# Patient Record
Sex: Male | Born: 1940
Health system: Southern US, Community
[De-identification: ages and names within clinical notes are randomized; demographics above are authoritative.]

## PROBLEM LIST (undated history)

## (undated) DIAGNOSIS — I4891 Unspecified atrial fibrillation: Secondary | ICD-10-CM

## (undated) DIAGNOSIS — I219 Acute myocardial infarction, unspecified: Secondary | ICD-10-CM

## (undated) DIAGNOSIS — IMO0001 Reserved for inherently not codable concepts without codable children: Secondary | ICD-10-CM

## (undated) DIAGNOSIS — R3915 Urgency of urination: Secondary | ICD-10-CM

## (undated) DIAGNOSIS — M1990 Unspecified osteoarthritis, unspecified site: Secondary | ICD-10-CM

## (undated) DIAGNOSIS — I2699 Other pulmonary embolism without acute cor pulmonale: Secondary | ICD-10-CM

## (undated) DIAGNOSIS — K219 Gastro-esophageal reflux disease without esophagitis: Secondary | ICD-10-CM

## (undated) DIAGNOSIS — Z87442 Personal history of urinary calculi: Secondary | ICD-10-CM

## (undated) DIAGNOSIS — I2510 Atherosclerotic heart disease of native coronary artery without angina pectoris: Secondary | ICD-10-CM

## (undated) DIAGNOSIS — Z8739 Personal history of other diseases of the musculoskeletal system and connective tissue: Secondary | ICD-10-CM

## (undated) DIAGNOSIS — E039 Hypothyroidism, unspecified: Secondary | ICD-10-CM

## (undated) DIAGNOSIS — G8929 Other chronic pain: Secondary | ICD-10-CM

## (undated) DIAGNOSIS — I509 Heart failure, unspecified: Secondary | ICD-10-CM

## (undated) DIAGNOSIS — H269 Unspecified cataract: Secondary | ICD-10-CM

## (undated) DIAGNOSIS — I82409 Acute embolism and thrombosis of unspecified deep veins of unspecified lower extremity: Secondary | ICD-10-CM

## (undated) DIAGNOSIS — N400 Enlarged prostate without lower urinary tract symptoms: Secondary | ICD-10-CM

## (undated) DIAGNOSIS — Z860100 Personal history of colon polyps, unspecified: Secondary | ICD-10-CM

## (undated) DIAGNOSIS — N289 Disorder of kidney and ureter, unspecified: Secondary | ICD-10-CM

## (undated) DIAGNOSIS — I10 Essential (primary) hypertension: Secondary | ICD-10-CM

## (undated) DIAGNOSIS — I499 Cardiac arrhythmia, unspecified: Secondary | ICD-10-CM

## (undated) DIAGNOSIS — Z95810 Presence of automatic (implantable) cardiac defibrillator: Secondary | ICD-10-CM

## (undated) DIAGNOSIS — E785 Hyperlipidemia, unspecified: Secondary | ICD-10-CM

## (undated) DIAGNOSIS — I739 Peripheral vascular disease, unspecified: Secondary | ICD-10-CM

## (undated) DIAGNOSIS — I6529 Occlusion and stenosis of unspecified carotid artery: Secondary | ICD-10-CM

## (undated) DIAGNOSIS — M549 Dorsalgia, unspecified: Secondary | ICD-10-CM

## (undated) DIAGNOSIS — R350 Frequency of micturition: Secondary | ICD-10-CM

## (undated) HISTORY — DX: Occlusion and stenosis of unspecified carotid artery: I65.29

## (undated) HISTORY — PX: CYSTOSCOPY: SUR368

## (undated) HISTORY — DX: Acute embolism and thrombosis of unspecified deep veins of unspecified lower extremity: I82.409

## (undated) HISTORY — PX: EYE SURGERY: SHX253

## (undated) HISTORY — DX: Unspecified atrial fibrillation: I48.91

## (undated) HISTORY — PX: APPENDECTOMY: SHX54

## (undated) HISTORY — PX: COLONOSCOPY: SHX174

## (undated) HISTORY — PX: BACK SURGERY: SHX140

## (undated) HISTORY — DX: Heart failure, unspecified: I50.9

## (undated) HISTORY — PX: FEMORAL ARTERY - POPLITEAL ARTERY BYPASS GRAFT: SUR180

## (undated) HISTORY — PX: JOINT REPLACEMENT: SHX530

## (undated) HISTORY — PX: OTHER SURGICAL HISTORY: SHX169

## (undated) HISTORY — DX: Atherosclerotic heart disease of native coronary artery without angina pectoris: I25.10

## (undated) HISTORY — DX: Unspecified cataract: H26.9

## (undated) HISTORY — PX: CARDIAC CATHETERIZATION: SHX172

---

## 1998-06-17 ENCOUNTER — Ambulatory Visit (HOSPITAL_COMMUNITY): Admission: RE | Admit: 1998-06-17 | Discharge: 1998-06-17 | Payer: Self-pay | Admitting: *Deleted

## 1998-08-08 ENCOUNTER — Emergency Department (HOSPITAL_COMMUNITY): Admission: EM | Admit: 1998-08-08 | Discharge: 1998-08-08 | Payer: Self-pay | Admitting: Emergency Medicine

## 1998-08-10 ENCOUNTER — Encounter: Payer: Self-pay | Admitting: Urology

## 1998-08-10 ENCOUNTER — Inpatient Hospital Stay (HOSPITAL_COMMUNITY): Admission: EM | Admit: 1998-08-10 | Discharge: 1998-08-11 | Payer: Self-pay | Admitting: Emergency Medicine

## 1998-10-31 ENCOUNTER — Inpatient Hospital Stay (HOSPITAL_COMMUNITY): Admission: RE | Admit: 1998-10-31 | Discharge: 1998-11-02 | Payer: Self-pay | Admitting: Orthopaedic Surgery

## 1998-10-31 ENCOUNTER — Encounter: Payer: Self-pay | Admitting: Orthopaedic Surgery

## 1998-11-11 ENCOUNTER — Encounter: Admission: RE | Admit: 1998-11-11 | Discharge: 1999-02-09 | Payer: Self-pay | Admitting: Orthopaedic Surgery

## 1999-02-11 ENCOUNTER — Encounter: Admission: RE | Admit: 1999-02-11 | Discharge: 1999-02-27 | Payer: Self-pay | Admitting: Orthopaedic Surgery

## 1999-09-18 ENCOUNTER — Other Ambulatory Visit: Admission: RE | Admit: 1999-09-18 | Discharge: 1999-09-18 | Payer: Self-pay | Admitting: Urology

## 2002-05-22 ENCOUNTER — Ambulatory Visit (HOSPITAL_COMMUNITY): Admission: RE | Admit: 2002-05-22 | Discharge: 2002-05-22 | Payer: Self-pay | Admitting: Gastroenterology

## 2002-06-05 ENCOUNTER — Encounter (INDEPENDENT_AMBULATORY_CARE_PROVIDER_SITE_OTHER): Payer: Self-pay | Admitting: Specialist

## 2002-06-05 ENCOUNTER — Ambulatory Visit (HOSPITAL_COMMUNITY): Admission: RE | Admit: 2002-06-05 | Discharge: 2002-06-05 | Payer: Self-pay | Admitting: Gastroenterology

## 2006-04-21 ENCOUNTER — Encounter: Payer: Self-pay | Admitting: Urology

## 2006-04-21 ENCOUNTER — Encounter: Payer: Self-pay | Admitting: Emergency Medicine

## 2006-07-28 ENCOUNTER — Ambulatory Visit (HOSPITAL_COMMUNITY): Admission: RE | Admit: 2006-07-28 | Discharge: 2006-07-28 | Payer: Self-pay | Admitting: Internal Medicine

## 2007-08-30 ENCOUNTER — Ambulatory Visit: Payer: Self-pay | Admitting: Vascular Surgery

## 2007-09-13 ENCOUNTER — Ambulatory Visit: Payer: Self-pay | Admitting: Vascular Surgery

## 2008-07-23 ENCOUNTER — Encounter: Admission: RE | Admit: 2008-07-23 | Discharge: 2008-07-23 | Payer: Self-pay | Admitting: Orthopaedic Surgery

## 2008-07-24 ENCOUNTER — Ambulatory Visit: Payer: Self-pay | Admitting: Gastroenterology

## 2008-08-08 ENCOUNTER — Encounter: Payer: Self-pay | Admitting: Gastroenterology

## 2008-08-08 ENCOUNTER — Ambulatory Visit: Payer: Self-pay | Admitting: Gastroenterology

## 2008-08-13 ENCOUNTER — Encounter: Payer: Self-pay | Admitting: Gastroenterology

## 2009-03-06 ENCOUNTER — Inpatient Hospital Stay (HOSPITAL_COMMUNITY): Admission: AD | Admit: 2009-03-06 | Discharge: 2009-03-07 | Payer: Self-pay | Admitting: Cardiovascular Disease

## 2010-04-01 ENCOUNTER — Encounter (INDEPENDENT_AMBULATORY_CARE_PROVIDER_SITE_OTHER): Payer: Self-pay | Admitting: *Deleted

## 2011-01-20 NOTE — Letter (Signed)
Summary: New Patient letter  [name]  [address] [place], [place] [address]   Phone: [phone]  Fax: [phone]       04/01/2010 MRN: [id number]  [name] [address] [place], [place]  [address]  Dear Ryan [name],  Welcome to the Gastroenterology Division at [name].    You are scheduled to see Dr. Arlyce Dice on 04-30-10 at 10:00a.m.  on the 3rd floor at [facility], [address].  We ask that you try to arrive at our office 15 minutes prior to your appointment time to allow for check-in.  We would like you to complete the enclosed self-administered evaluation form prior to your visit and bring it with you on the day of your appointment.  We will review it with you.  Also, please bring a complete list of all your medications or, if you prefer, bring the medication bottles and we will list them.  Please bring your insurance card so that we may make a copy of it.  If your insurance requires a referral to see a specialist, please bring your referral form from your primary care physician.  Co-payments are due at the time of your visit and may be paid by cash, check or credit card.     Your office visit will consist of a consult with your physician (includes a physical exam), any laboratory testing he/she may order, scheduling of any necessary diagnostic testing (e.g. x-ray, ultrasound, CT-scan), and scheduling of a procedure (e.g. Endoscopy, Colonoscopy) if required.  Please allow enough time on your schedule to allow for any/all of these possibilities.    If you cannot keep your appointment, please call [phone] to cancel or reschedule prior to your appointment date.  This allows [place] the opportunity to schedule an appointment for another patient in need of care.  If you do not cancel or reschedule by 5 p.m. the business day prior to your appointment date, you will be charged a $50.00 late cancellation/no-show fee.    Thank you for  choosing [facility] for your medical needs.  We appreciate the opportunity to care for you.  Please visit [place] at our website  to learn more about our practice.                     Sincerely,                                                             The Gastroenterology Division

## 2011-04-02 LAB — COMPREHENSIVE METABOLIC PANEL
ALT: 17 U/L (ref 0–53)
AST: 25 U/L (ref 0–37)
Alkaline Phosphatase: 45 U/L (ref 39–117)
CO2: 25 mEq/L (ref 19–32)
Chloride: 105 mEq/L (ref 96–112)
Creatinine, Ser: 1.02 mg/dL (ref 0.4–1.5)
GFR calc Af Amer: >60 mL/min (ref >60)
GFR calc non Af Amer: >60 mL/min (ref >60)
Sodium: 139 mEq/L (ref 135–145)
Total Bilirubin: 0.8 mg/dL (ref 0.3–1.2)

## 2011-04-02 LAB — CBC
MCHC: 34.5 g/dL (ref 30.0–36.0)
MCV: 94.5 fL (ref 78.0–100.0)
MCV: 94.5 fL (ref 78.0–100.0)
Platelets: 231 10*3/uL (ref 150–400)
RBC: 4.22 MIL/uL (ref 4.22–5.81)
RBC: 4.47 MIL/uL (ref 4.22–5.81)
RDW: 12.8 % (ref 11.5–15.5)
WBC: 9.1 10*3/uL (ref 4.0–10.5)

## 2011-04-02 LAB — CARDIAC PANEL(CRET KIN+CKTOT+MB+TROPI): Total CK: 301 U/L — ABNORMAL HIGH (ref 7–232)

## 2011-04-02 LAB — HEPARIN LEVEL (UNFRACTIONATED): Heparin Unfractionated: 0.29 IU/mL — ABNORMAL LOW (ref 0.30–0.70)

## 2011-05-05 NOTE — H&P (Signed)
NAMECLARKSON, ROSSELLI NO.:  [id number]   MEDICAL RECORD NO.:  [id number]          PATIENT TYPE:  INP   LOCATION:  2003                         FACILITY:  [facility]   PHYSICIAN:  Vesta Mixer, M.D. DATE OF BIRTH:  March 06, 1941   DATE OF ADMISSION:  03/06/2009  DATE OF DISCHARGE:                              HISTORY & PHYSICAL   [name] is a 70 year old gentleman with a history of peripheral  vascular disease and a remote history of myocardial infarction.  He is  admitted to the hospital with symptoms consistent with unstable angina.   [name] has remote history of an inferior wall myocardial  infarction.  He was previously seen by Dr. Othelia Pulling.  He has a  history of peripheral vascular disease and is status post aortobifemoral  bypass by Dr. Jerilee Field.   He presents today with several days of intermittent episodes of chest  pain.  It is left sided.  There is no specific radiation.  It can last  for several hours and is associated with nausea and some presyncope.  He  also describes some shortness of breath for the past several months.  There is no diaphoresis.  He has been under lots of mental stress  recently.  There are no precipitating factors.  These episodes can occur  various times and occasionally have been associated with exertion.  These chest pains have been more frequent over the past several days.   He denies any association with eating, drinking, or change of position.  He denies any PND or orthopnea.  He denies any syncope or presyncope.   CURRENT MEDICATIONS:  1. Benicar 40 mg a day.  2. Lipitor 20 mg a day.   He is allergic to CODEINE.   PAST MEDICAL HISTORY:  1. History of remote inferior wall myocardial infarction.  2. History of peripheral vascular disease - status post aortobifemoral      bypass by Dr. Jerilee Field.   SOCIAL HISTORY:  The patient used to smoke, but quit 30+ years ago.  He  does not drink alcohol.   FAMILY HISTORY:  Significant for cardiac disease.   REVIEW OF SYSTEMS:  Reviewed.  All other systems were negative.   PHYSICAL EXAMINATION:  GENERAL:  He is a middle-aged gentleman in no  acute distress.  He is alert and oriented x3 and his mood and affect are  normal.  VITAL SIGNS:  His weight is 188.  Blood pressure is 130/70 with heart  rate of 84.  HEENT:  2+ carotids.  He has no bruits.  No JVD.  NECK:  Supple.  LUNGS:  Clear.  BACK:  Nontender.  HEART:  Regular rate.  S1 and S2.  There is no murmurs, gallops, or  rubs.  His PMI is nondisplaced.  ABDOMINAL:  Good bowel sounds and is nontender.  EXTREMITIES:  He has no clubbing, cyanosis, or edema.  NEUROLOGIC:  Nonfocal.   His EKG reveals normal sinus rhythm.  He has nonspecific ST and T wave  changes.   [name] presents with some symptoms consistent with unstable  angina.  He has a history of previous inferior wall myocardial  infarction and states that these pains are very similar to his previous  MI pains.  We gave him some nitroglycerin here in the office without  significant change.  We will add aspirin to his medical regimen.  We  will admit him to the hospital and start him on heparin.  We will  anticipate doing a heart catheterization tomorrow.  We have discussed  the risks, benefits, and options of heart catheterization.  He  understands and agrees to proceed.       Vesta Mixer, M.D.  Electronically Signed     PJN/MEDQ  D:  03/06/2009  T:  03/07/2009  Job:  [id number]   cc:   Quita Skye. Hart [place], M.D.

## 2011-05-05 NOTE — Consult Note (Signed)
VASCULAR SURGERY CONSULTATION   Ryan [name], Ryan [name]  DOB:  05-11-1941                                       09/13/2007  CHART#:[id number]   This is a new vascular surgery consultation.   Patient is a 70 year old male patient known to me from 20 years ago,  having undergone aortobifemoral bypass grafting for severe claudication  symptoms in 1987.  He quit smoking at that time and has done well from  his claudication standpoint until the past year when he developed hip  discomfort with ambulation, which is usually symmetrical and requires  him to stop talking after veritable distances.  Sometimes this is as  small as one block and sometimes it is after a half mile, but it is  always relieved by rest.  He has also noticed some numbness and tingling  on the anterior aspect of his right thigh, which is a new finding.  He  has gained some weight in the last few years.  He has had no rest pain  or history of nonhealing ulcers.   Past medical history is negative for diabetes, hypertension, CVA,  stroke, or COPD.  He does have a history of coronary artery disease with  at least two previous myocardial infarctions but is currently  asymptomatic.   PREVIOUS SURGERY:  1. Aortobifemoral bypass grafting.  2. Arthroscopy, left knee.  3. Left shoulder replacement by Dr. Cleophas Dunker.   FAMILY HISTORY:  Positive for coronary artery disease in his father and  sister, who died of a myocardial infarction.  Positive for diabetes in a  son.  Negative for stroke.   SOCIAL HISTORY:  He is married.  Has two children.  Is retired.  He has  not smoked since 1988 and has not used alcohol.   REVIEW OF SYSTEMS:  Please see health history form.   MEDICATIONS:  Please see health history form.   PHYSICAL EXAMINATION:  Blood pressure 140/60, heart rate 71,  respirations 16.  Generally, he is a healthy-appearing male in no  apparent distress.  He is alert and oriented x3.  His neck is  supple  with 3+ carotid pulses palpable.  No bruits are audible.  Neurologic  exam is normal.  No palpable adenopathy in the neck.  Chest:  Clear to  auscultation.  Cardiovascular:  Regular rhythm with no murmurs.  Upper  extremity pulses are 3+ bilaterally.  His abdomen is soft and nontender  with no palpable masses.  He has 3+ femoral, popliteal, and dorsalis  pedis pulses bilaterally with well perfused lower extremities.  No  bruits are audible.   Lower extremity arterial Dopplers were performed in our office a week  ago and reveal normal ABIs and normal waveforms bilaterally.   I do not think his symptoms are due to any major arterial insufficiency.  He could have some partial blockages in his internal iliac arteries,  which would not be something we would treat.  It also could be that he  has some arthritis in his hip joints, since he has had arthritis in  other areas, and I would recommend an orthopedic evaluation if these  symptoms are limiting enough.  I will be happy to see him again in the  future on a p.r.n. basis.   Quita Skye Hart [place], M.D.  Electronically Signed  JDL/MEDQ  D:  09/13/2007  T:  09/14/2007  Job:  [id number]   cc:   Kari Baars, M.D.

## 2011-05-05 NOTE — Cardiovascular Report (Signed)
NAMEADEN, SEK NO.:  [id number]   MEDICAL RECORD NO.:  [id number]          PATIENT TYPE:  INP   LOCATION:  2003                         FACILITY:  [facility]   PHYSICIAN:  Vesta Mixer, M.D. DATE OF BIRTH:  01-02-1941   DATE OF PROCEDURE:  03/07/2009  DATE OF DISCHARGE:  03/07/2009                            CARDIAC CATHETERIZATION   [name] is a 70 year old gentleman with a history of peripheral  vascular disease.  He is status post aortobifemoral approximately 22  years ago.  He presented yesterday.  He also has a history of 2  myocardial infarctions in the past.  He has never had a heart  catheterization.  He presented to the office yesterday with episodes of  chest pain, nausea, and symptoms consistent with unstable angina.  He  was admitted and referred for heart catheterization for further  evaluation.   The procedure was left heart catheterization with coronary angiography.   The right femoral artery was easily cannulated using modified Seldinger  technique.   HEMODYNAMICS:  LV pressure is 115/16 with an aortic pressure of 112/55.   ANGIOGRAPHY:  Left main.  The left main has minor irregularities.  There  is moderate degree of calcification extending from the left main down to  the LAD.   The left anterior descending artery has a proximal 30% stenosis.  There  is diffuse irregularities between 30-40% along the entire LAD.  The mid  LAD has a 40-50% stenosis and the distal LAD has a 50-60% stenosis.  The  first diagonal artery has mild-to-moderate diffuse disease.   The ramus intermediate vessel has mild diffuse disease.  There is 30-40%  stenosis throughout.   The left circumflex artery has a proximal 30-40% stenosis.  It gives off  a small-to-medium size first obtuse marginal branch and is then  occluded.  The terminal circumflex artery fills very slowly and has  probably TIMI grade 1 flow.  It fills via left-to-left collaterals.   The right coronary artery is dominant.  It is occluded very proximally.  The distal right coronary artery fills primarily via left-to-right  collaterals from the LAD.  There is some right-to-right filling via  collaterals and the right coronary artery seemed to be severely and  diffusely diseased throughout its course.   The left ventriculogram was performed in the 30 RAO position.  Reveals,  overall well-preserved left ventricular systolic function.  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  The overall ejection fraction is probably  50%.  Excluding the aneurysm, the left ventricular systolic function is  quite normal.  There is 1-2 plus mitral regurgitation.   COMPLICATIONS:  None.   CONCLUSION:  Moderate diffuse coronary artery disease.  He has 2 known  myocardial infarctions, and now we have been able to identify that these  were due to an occlusion of the right coronary artery and the distal  left  circumflex artery.  He has moderate lesions elsewhere and none of these  are severe enough to warrant revascularization.  He has well-preserved  left ventricular systolic function.  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  There is no evidence of thrombus.  We will anticipate him  going home today.      Vesta Mixer, M.D.  Electronically Signed     PJN/MEDQ  D:  03/07/2009  T:  03/07/2009  Job:  [id number]

## 2011-05-05 NOTE — Discharge Summary (Signed)
[name], Ryan [name] NO.:  [id number]   MEDICAL RECORD NO.:  [id number]          PATIENT TYPE:  INP   LOCATION:  2003                         FACILITY:  [facility]   PHYSICIAN:  Vesta Mixer, M.D. DATE OF BIRTH:  December 28, 1940   DATE OF ADMISSION:  03/06/2009  DATE OF DISCHARGE:  03/07/2009                               DISCHARGE SUMMARY   DISCHARGE DIAGNOSES:  1. Chest pain - most likely noncardiac.  2. History of coronary artery disease with 2 previous myocardial      infarctions.  3. Dyslipidemia.  4. History of peripheral vascular disease.   DISCHARGE MEDICATIONS:  1. Aspirin 81 mg a day.  2. Lipitor 20 mg a day.  3. Benicar 40 mg a day.  4. Nitroglycerin 0.4 mg sublingually as needed.   DISPOSITION:  The patient will see Dr. Elease Hashimoto in 1-2 weeks.  He is to  see Dr. Clelia Croft if needed for medical problems.   HISTORY:  Mr. Potvin is a 70 year old gentleman who was admitted with  some episodes of chest pain.  He was admitted yesterday.  Please see  dictated H and P for further details.   HOSPITAL COURSE:  Chest pain.  The patient had negative cardiac enzymes.  He had a heart catheterization this morning which revealed an occluded  right coronary artery as well as an occluded distal circumflex artery.  These are certainly the site of his 2 previous and known myocardial  infarctions.  He had moderate disease elsewhere.  He did not have any  critical lesions that would be the cause of unstable angina.  He  tolerated the cath fairly well.  We will send him home on the above-  noted medications as well as aspirin plus nitroglycerin.  He has tried  Crestor in the past, but did not tolerate because of some GI bleeding.  We may want to retry that again.  I have never heard of Crestor causing  GI bleeds.  He will need aggressive medical therapy for moderate diffuse  coronary artery disease.  We will see him back in the office in a week  or two.      Vesta Mixer, M.D.  Electronically Signed     PJN/MEDQ  D:  03/07/2009  T:  03/08/2009  Job:  [id number]   cc:   Kari Baars, M.D.  Quita Skye Hart [place], M.D.

## 2011-05-11 ENCOUNTER — Other Ambulatory Visit (INDEPENDENT_AMBULATORY_CARE_PROVIDER_SITE_OTHER): Payer: Medicare Other

## 2011-05-11 DIAGNOSIS — I6529 Occlusion and stenosis of unspecified carotid artery: Secondary | ICD-10-CM

## 2011-05-20 NOTE — Procedures (Unsigned)
CAROTID DUPLEX EXAM  INDICATION:  Followup carotid disease.  HISTORY: Diabetes:  No. Cardiac:  Yes. Hypertension:  No. Smoking:  Previous. Previous Surgery:  Peripheral vascular disease - aortobifem graft. CV History: Amaurosis Fugax No, Paresthesias No, Hemiparesis No                                      RIGHT             LEFT Brachial systolic pressure:         146               138 Brachial Doppler waveforms:         WNL               WNL Vertebral direction of flow:        Antegrade         Antegrade DUPLEX VELOCITIES (cm/sec) CCA peak systolic                   97                87 ECA peak systolic                   297               206 ICA peak systolic                   66                221 ICA end diastolic                   20                63 PLAQUE MORPHOLOGY:                  Calcific          Calcific PLAQUE AMOUNT:                      Mild              Moderate to severe PLAQUE LOCATION:                    CCA, ECA, ICA     ECA, ICA  IMPRESSION: 1. 1%-39% stenosis of the right internal carotid artery. 2. Borderline 60%-79% left internal carotid artery stenosis. 3. Antegrade vertebral arteries bilaterally. 4. Bilateral external carotid artery stenosis.  ___________________________________________ [name], M.D.  LT/MEDQ  D:  05/11/2011  T:  05/11/2011  Job:  [id number]

## 2011-06-30 ENCOUNTER — Encounter (INDEPENDENT_AMBULATORY_CARE_PROVIDER_SITE_OTHER): Payer: Medicare Other | Admitting: Vascular Surgery

## 2011-06-30 DIAGNOSIS — I6529 Occlusion and stenosis of unspecified carotid artery: Secondary | ICD-10-CM

## 2011-06-30 NOTE — Consult Note (Signed)
NEW PATIENT CONSULTATION  Ryan [name], Ryan [name] DOB:  Jul 31, 1941                                       06/30/2011 CHART#:[id number]  The patient is a 70 year old male patient known to me from previous aortobifemoral bypass grafting in 1987 for severe claudication.  He has done well from that standpoint and I also saw him in 2008 with some leg symptoms which were not due to vascular insufficiency.  He is now able to walk about 2 miles without stopping.  He was found to have some carotid occlusive disease, has no history of stroke, hemiparesis, aphasia, amaurosis fugax, diplopia, blurred vision or syncope.  STABLE MEDICAL PROBLEMS: 1. Hypertension. 2. Hyperlipidemia. 3. Hypothyroidism. 4. Gout. 5. Elevated PSA. 6. Negative for coronary artery disease, COPD or stroke.  SOCIAL HISTORY:  He is married, does not use tobacco or alcohol.  FAMILY HISTORY:  Positive for coronary artery disease in his father and sister.  Negative for stroke.  Positive for diabetes in his son.  REVIEW OF SYSTEMS:  Denies any chest pain, dyspnea on exertion, PND, orthopnea, has occasional hip discomfort from arthritis.  All other systems are negative in complete review of systems.  PHYSICAL EXAM:  Vital signs:  Blood pressure 147/82, heart rate 81, respirations 18.  General:  A well-developed, well-nourished male in no apparent distress, alert and oriented x3.  HEENT:  Exam normal for age. EOMs intact.  Lungs:  Clear to auscultation.  No rhonchi or wheezing. Cardiovascular:  Regular rhythm.  No murmurs.  Carotid pulses are 3+. Soft bruit on the left.  Neurologic:  Normal.  Abdomen:  Soft, nontender with no masses or ventral hernia.  Musculoskeletal:  Exam is free of major deformities.  Lower extremities:  Exam reveals 2+ femoral, 2+ posterior tibial pulses palpable bilaterally.  I reviewed and interpreted his carotid duplex exam which revealed a moderate left internal carotid stenosis  in the 60%-70% range with no flow reduction in his right internal carotid.  The patient is asymptomatic with a moderate left internal carotid stenosis which we will need to follow on an annual basis.  If he develops any symptoms he will be in touch with me, otherwise we will check his carotid study again in 1 year.  He will continue on daily aspirin.    Quita Skye Hart [place], M.D. Electronically Signed  JDL/MEDQ  D:  06/30/2011  T:  06/30/2011  Job:  [id number]  cc:   Kari Baars, M.D.

## 2012-01-21 DIAGNOSIS — M5137 Other intervertebral disc degeneration, lumbosacral region: Secondary | ICD-10-CM | POA: Diagnosis not present

## 2012-01-21 DIAGNOSIS — M48061 Spinal stenosis, lumbar region without neurogenic claudication: Secondary | ICD-10-CM | POA: Diagnosis not present

## 2012-01-21 DIAGNOSIS — M545 Low back pain, unspecified: Secondary | ICD-10-CM | POA: Diagnosis not present

## 2012-01-21 DIAGNOSIS — IMO0002 Reserved for concepts with insufficient information to code with codable children: Secondary | ICD-10-CM | POA: Diagnosis not present

## 2012-01-22 ENCOUNTER — Other Ambulatory Visit: Payer: Self-pay | Admitting: Neurosurgery

## 2012-01-25 DIAGNOSIS — I10 Essential (primary) hypertension: Secondary | ICD-10-CM | POA: Diagnosis not present

## 2012-01-25 DIAGNOSIS — E785 Hyperlipidemia, unspecified: Secondary | ICD-10-CM | POA: Diagnosis not present

## 2012-01-25 DIAGNOSIS — E039 Hypothyroidism, unspecified: Secondary | ICD-10-CM | POA: Diagnosis not present

## 2012-01-25 DIAGNOSIS — R7301 Impaired fasting glucose: Secondary | ICD-10-CM | POA: Diagnosis not present

## 2012-02-01 ENCOUNTER — Encounter (HOSPITAL_COMMUNITY): Payer: Self-pay | Admitting: Pharmacy Technician

## 2012-02-04 NOTE — Pre-Procedure Instructions (Signed)
20 [name]  02/04/2012   Your procedure is scheduled on:  02/12/12  Report to [facility] at 1230 pm  Call this number if you have problems the morning of surgery: [phone]   Remember:   Do not eat food:After Midnight.  May have clear liquids: up to 4 Hours before arrival.  Clear liquids include soda, tea, black coffee, apple or grape juice, broth.  Take these medicines the morning of surgery with A SIP OF WATER: synthroid,bystolic,flomax   Do not wear jewelry, make-up or nail polish.  Do not wear lotions, powders, or perfumes. You may wear deodorant.  Do not shave 48 hours prior to surgery.  Do not bring valuables to the hospital.  Contacts, dentures or bridgework may not be worn into surgery.  Leave suitcase in the car. After surgery it may be brought to your room.  For patients admitted to the hospital, checkout time is 11:00 AM the day of discharge.   Patients discharged the day of surgery will not be allowed to drive home.  Name and phone number of your driver: family  Special Instructions: CHG Shower Use Special Wash: 1/2 bottle night before surgery and 1/2 bottle morning of surgery.   Please read over the following fact sheets that you were given: Pain Booklet, Coughing and Deep Breathing, MRSA Information and Surgical Site Infection Prevention

## 2012-02-05 ENCOUNTER — Encounter (HOSPITAL_COMMUNITY)
Admission: RE | Admit: 2012-02-05 | Discharge: 2012-02-05 | Disposition: A | Discharge status: Home / Self Care | Payer: Medicare Other | Source: Ambulatory Visit | Attending: Neurosurgery | Admitting: Neurosurgery

## 2012-02-05 ENCOUNTER — Encounter (HOSPITAL_COMMUNITY): Payer: Self-pay

## 2012-02-05 ENCOUNTER — Other Ambulatory Visit: Payer: Self-pay

## 2012-02-05 DIAGNOSIS — I517 Cardiomegaly: Secondary | ICD-10-CM | POA: Diagnosis not present

## 2012-02-05 DIAGNOSIS — M48061 Spinal stenosis, lumbar region without neurogenic claudication: Secondary | ICD-10-CM | POA: Diagnosis not present

## 2012-02-05 DIAGNOSIS — I739 Peripheral vascular disease, unspecified: Secondary | ICD-10-CM | POA: Diagnosis not present

## 2012-02-05 DIAGNOSIS — IMO0002 Reserved for concepts with insufficient information to code with codable children: Secondary | ICD-10-CM | POA: Diagnosis not present

## 2012-02-05 DIAGNOSIS — I10 Essential (primary) hypertension: Secondary | ICD-10-CM | POA: Diagnosis not present

## 2012-02-05 DIAGNOSIS — M4800 Spinal stenosis, site unspecified: Secondary | ICD-10-CM | POA: Diagnosis not present

## 2012-02-05 HISTORY — DX: Essential (primary) hypertension: I10

## 2012-02-05 HISTORY — DX: Peripheral vascular disease, unspecified: I73.9

## 2012-02-05 HISTORY — DX: Acute myocardial infarction, unspecified: I21.9

## 2012-02-05 HISTORY — DX: Unspecified osteoarthritis, unspecified site: M19.90

## 2012-02-05 HISTORY — DX: Hyperlipidemia, unspecified: E78.5

## 2012-02-05 LAB — CBC
HCT: 43.4 % (ref 39.0–52.0)
MCHC: 34.6 g/dL (ref 30.0–36.0)
Platelets: 293 10*3/uL (ref 150–400)
RDW: 12.1 % (ref 11.5–15.5)

## 2012-02-05 LAB — BASIC METABOLIC PANEL
BUN: 14 mg/dL (ref 6–23)
GFR calc Af Amer: >90 mL/min (ref >90)
GFR calc non Af Amer: 82 mL/min — ABNORMAL LOW (ref >90)
Potassium: 4.7 mEq/L (ref 3.5–5.1)

## 2012-02-05 LAB — SURGICAL PCR SCREEN: MRSA, PCR: NEGATIVE

## 2012-02-05 NOTE — Progress Notes (Signed)
Last cardiac info in chart and epic 2010.has not seen cardiac md since.

## 2012-02-11 MED ORDER — CEFAZOLIN SODIUM-DEXTROSE 2-3 GM-% IV SOLR
2.0000 g | INTRAVENOUS | Status: DC
  Filled 2012-02-11: qty 50

## 2012-02-12 ENCOUNTER — Inpatient Hospital Stay (HOSPITAL_COMMUNITY)
Admission: RE | Admit: 2012-02-12 | Discharge: 2012-02-13 | DRG: 491 | Disposition: A | Discharge status: Home / Self Care | Payer: Medicare Other | Source: Ambulatory Visit | Attending: Neurosurgery | Admitting: Neurosurgery

## 2012-02-12 ENCOUNTER — Inpatient Hospital Stay (HOSPITAL_COMMUNITY): Payer: Medicare Other

## 2012-02-12 ENCOUNTER — Encounter (HOSPITAL_COMMUNITY): Payer: Self-pay | Admitting: Vascular Surgery

## 2012-02-12 ENCOUNTER — Encounter (HOSPITAL_COMMUNITY)
Admission: RE | Disposition: A | Discharge status: Home / Self Care | Payer: Self-pay | Source: Ambulatory Visit | Attending: Neurosurgery

## 2012-02-12 ENCOUNTER — Inpatient Hospital Stay (HOSPITAL_COMMUNITY): Payer: Medicare Other | Admitting: Vascular Surgery

## 2012-02-12 ENCOUNTER — Encounter (HOSPITAL_COMMUNITY): Payer: Self-pay | Admitting: *Deleted

## 2012-02-12 DIAGNOSIS — IMO0002 Reserved for concepts with insufficient information to code with codable children: Secondary | ICD-10-CM | POA: Diagnosis present

## 2012-02-12 DIAGNOSIS — I252 Old myocardial infarction: Secondary | ICD-10-CM | POA: Diagnosis not present

## 2012-02-12 DIAGNOSIS — M5137 Other intervertebral disc degeneration, lumbosacral region: Secondary | ICD-10-CM | POA: Diagnosis not present

## 2012-02-12 DIAGNOSIS — Z886 Allergy status to analgesic agent status: Secondary | ICD-10-CM | POA: Diagnosis not present

## 2012-02-12 DIAGNOSIS — I10 Essential (primary) hypertension: Secondary | ICD-10-CM | POA: Diagnosis present

## 2012-02-12 DIAGNOSIS — Z9889 Other specified postprocedural states: Secondary | ICD-10-CM

## 2012-02-12 DIAGNOSIS — Z79899 Other long term (current) drug therapy: Secondary | ICD-10-CM

## 2012-02-12 DIAGNOSIS — Z01812 Encounter for preprocedural laboratory examination: Secondary | ICD-10-CM | POA: Diagnosis not present

## 2012-02-12 DIAGNOSIS — Z981 Arthrodesis status: Secondary | ICD-10-CM | POA: Diagnosis not present

## 2012-02-12 DIAGNOSIS — M48061 Spinal stenosis, lumbar region without neurogenic claudication: Secondary | ICD-10-CM | POA: Diagnosis not present

## 2012-02-12 DIAGNOSIS — I739 Peripheral vascular disease, unspecified: Secondary | ICD-10-CM | POA: Diagnosis not present

## 2012-02-12 DIAGNOSIS — M545 Low back pain, unspecified: Secondary | ICD-10-CM | POA: Diagnosis not present

## 2012-02-12 DIAGNOSIS — E039 Hypothyroidism, unspecified: Secondary | ICD-10-CM | POA: Diagnosis present

## 2012-02-12 DIAGNOSIS — Z7982 Long term (current) use of aspirin: Secondary | ICD-10-CM

## 2012-02-12 DIAGNOSIS — Z96619 Presence of unspecified artificial shoulder joint: Secondary | ICD-10-CM

## 2012-02-12 HISTORY — PX: LUMBAR LAMINECTOMY/DECOMPRESSION MICRODISCECTOMY: SHX5026

## 2012-02-12 SURGERY — LUMBAR LAMINECTOMY/DECOMPRESSION MICRODISCECTOMY
Anesthesia: General | Site: Back | Wound class: Clean

## 2012-02-12 MED ORDER — SODIUM CHLORIDE 0.9 % IJ SOLN
3.0000 mL | Freq: Two times a day (BID) | INTRAMUSCULAR | Status: DC
  Administered 2012-02-12 – 2012-02-13 (×2): 3 mL via INTRAVENOUS

## 2012-02-12 MED ORDER — EPHEDRINE SULFATE 50 MG/ML IJ SOLN
INTRAMUSCULAR | Status: DC | PRN
  Administered 2012-02-12 (×3): 10 mg via INTRAVENOUS

## 2012-02-12 MED ORDER — SODIUM CHLORIDE 0.9 % IJ SOLN: 3.0000 mL | INTRAMUSCULAR | Status: DC | PRN

## 2012-02-12 MED ORDER — MORPHINE SULFATE 4 MG/ML IJ SOLN
2.0000 mg | INTRAMUSCULAR | Status: DC | PRN
  Administered 2012-02-13: 2 mg via INTRAVENOUS
  Filled 2012-02-12: qty 1

## 2012-02-12 MED ORDER — HEMOSTATIC AGENTS (NO CHARGE) OPTIME
TOPICAL | Status: DC | PRN
  Administered 2012-02-12: 1 via TOPICAL

## 2012-02-12 MED ORDER — LEVOTHYROXINE SODIUM 75 MCG PO TABS
75.0000 ug | ORAL_TABLET | Freq: Every day | ORAL | Status: DC
  Administered 2012-02-12 – 2012-02-13 (×2): 75 ug via ORAL
  Filled 2012-02-12 (×2): qty 1

## 2012-02-12 MED ORDER — HYDROMORPHONE HCL PF 1 MG/ML IJ SOLN
INTRAMUSCULAR | Status: AC
  Filled 2012-02-12: qty 1

## 2012-02-12 MED ORDER — ONDANSETRON HCL 4 MG/2ML IJ SOLN
INTRAMUSCULAR | Status: DC | PRN
  Administered 2012-02-12 (×2): 4 mg via INTRAVENOUS

## 2012-02-12 MED ORDER — CEFAZOLIN SODIUM 1-5 GM-% IV SOLN
1.0000 g | Freq: Three times a day (TID) | INTRAVENOUS | Status: AC
  Administered 2012-02-13 (×2): 1 g via INTRAVENOUS
  Filled 2012-02-12 (×2): qty 50

## 2012-02-12 MED ORDER — HYDROMORPHONE HCL PF 1 MG/ML IJ SOLN
0.2500 mg | INTRAMUSCULAR | Status: DC | PRN
  Administered 2012-02-12 (×5): 0.5 mg via INTRAVENOUS

## 2012-02-12 MED ORDER — ONDANSETRON HCL 4 MG/2ML IJ SOLN: 4.0000 mg | Freq: Once | INTRAMUSCULAR | Status: DC | PRN

## 2012-02-12 MED ORDER — MEPERIDINE HCL 25 MG/ML IJ SOLN: 6.2500 mg | INTRAMUSCULAR | Status: DC | PRN

## 2012-02-12 MED ORDER — SODIUM CHLORIDE 0.9 % IV SOLN: 250.0000 mL | INTRAVENOUS | Status: DC

## 2012-02-12 MED ORDER — NEBIVOLOL HCL 10 MG PO TABS
10.0000 mg | ORAL_TABLET | Freq: Every day | ORAL | Status: DC
  Administered 2012-02-13: 10 mg via ORAL
  Filled 2012-02-12 (×2): qty 1

## 2012-02-12 MED ORDER — NEOSTIGMINE METHYLSULFATE 1 MG/ML IJ SOLN
INTRAMUSCULAR | Status: DC | PRN
  Administered 2012-02-12: 5 mg via INTRAVENOUS

## 2012-02-12 MED ORDER — SODIUM CHLORIDE 0.9 % IV SOLN
INTRAVENOUS | Status: DC
  Administered 2012-02-12: 21:00:00 via INTRAVENOUS

## 2012-02-12 MED ORDER — TAMSULOSIN HCL 0.4 MG PO CAPS
0.4000 mg | ORAL_CAPSULE | Freq: Every day | ORAL | Status: DC
  Administered 2012-02-12 – 2012-02-13 (×2): 0.4 mg via ORAL
  Filled 2012-02-12 (×2): qty 1

## 2012-02-12 MED ORDER — GLYCOPYRROLATE 0.2 MG/ML IJ SOLN
INTRAMUSCULAR | Status: DC | PRN
  Administered 2012-02-12: .8 mg via INTRAVENOUS

## 2012-02-12 MED ORDER — COLESEVELAM HCL 625 MG PO TABS
1250.0000 mg | ORAL_TABLET | Freq: Two times a day (BID) | ORAL | Status: DC
  Administered 2012-02-13: 1250 mg via ORAL
  Filled 2012-02-12 (×3): qty 2

## 2012-02-12 MED ORDER — MORPHINE SULFATE 2 MG/ML IJ SOLN: 0.0500 mg/kg | INTRAMUSCULAR | Status: DC | PRN

## 2012-02-12 MED ORDER — ACETAMINOPHEN 650 MG RE SUPP: 650.0000 mg | RECTAL | Status: DC | PRN

## 2012-02-12 MED ORDER — DIAZEPAM 5 MG PO TABS
5.0000 mg | ORAL_TABLET | Freq: Four times a day (QID) | ORAL | Status: DC | PRN
  Administered 2012-02-12: 5 mg via ORAL
  Filled 2012-02-12: qty 1

## 2012-02-12 MED ORDER — DOCUSATE SODIUM 100 MG PO CAPS
100.0000 mg | ORAL_CAPSULE | Freq: Every day | ORAL | Status: DC
  Administered 2012-02-12 – 2012-02-13 (×2): 100 mg via ORAL
  Filled 2012-02-12 (×2): qty 1

## 2012-02-12 MED ORDER — METOCLOPRAMIDE HCL 5 MG/ML IJ SOLN
INTRAMUSCULAR | Status: DC | PRN
  Administered 2012-02-12: 10 mg via INTRAVENOUS

## 2012-02-12 MED ORDER — 0.9 % SODIUM CHLORIDE (POUR BTL) OPTIME
TOPICAL | Status: DC | PRN
  Administered 2012-02-12: 1000 mL

## 2012-02-12 MED ORDER — SUFENTANIL CITRATE 50 MCG/ML IV SOLN
INTRAVENOUS | Status: DC | PRN
  Administered 2012-02-12: 10 ug via INTRAVENOUS
  Administered 2012-02-12: 5 ug via INTRAVENOUS
  Administered 2012-02-12: 15 ug via INTRAVENOUS

## 2012-02-12 MED ORDER — ONDANSETRON HCL 4 MG/2ML IJ SOLN: 4.0000 mg | INTRAMUSCULAR | Status: DC | PRN

## 2012-02-12 MED ORDER — ROCURONIUM BROMIDE 100 MG/10ML IV SOLN
INTRAVENOUS | Status: DC | PRN
  Administered 2012-02-12: 50 mg via INTRAVENOUS

## 2012-02-12 MED ORDER — DEXAMETHASONE SODIUM PHOSPHATE 10 MG/ML IJ SOLN
INTRAMUSCULAR | Status: DC | PRN
  Administered 2012-02-12: 10 mg via INTRAVENOUS

## 2012-02-12 MED ORDER — THROMBIN 5000 UNITS EX KIT
PACK | CUTANEOUS | Status: DC | PRN
  Administered 2012-02-12 (×2): 5000 [IU] via TOPICAL

## 2012-02-12 MED ORDER — OXYCODONE-ACETAMINOPHEN 5-325 MG PO TABS: 1.0000 | ORAL_TABLET | ORAL | Status: DC | PRN

## 2012-02-12 MED ORDER — LACTATED RINGERS IV SOLN
INTRAVENOUS | Status: DC | PRN
  Administered 2012-02-12 (×2): via INTRAVENOUS

## 2012-02-12 MED ORDER — ASPIRIN EC 81 MG PO TBEC
81.0000 mg | DELAYED_RELEASE_TABLET | Freq: Every day | ORAL | Status: DC
  Administered 2012-02-13: 81 mg via ORAL
  Filled 2012-02-12: qty 1

## 2012-02-12 MED ORDER — PROPOFOL 10 MG/ML IV EMUL
INTRAVENOUS | Status: DC | PRN
  Administered 2012-02-12: 100 mg via INTRAVENOUS

## 2012-02-12 MED ORDER — BUPIVACAINE-EPINEPHRINE PF 0.5-1:200000 % IJ SOLN
INTRAMUSCULAR | Status: DC | PRN
  Administered 2012-02-12: 10 mL

## 2012-02-12 MED ORDER — CEFAZOLIN SODIUM 1-5 GM-% IV SOLN
INTRAVENOUS | Status: DC | PRN
  Administered 2012-02-12: 2 g via INTRAVENOUS

## 2012-02-12 MED ORDER — ACETAMINOPHEN 325 MG PO TABS: 650.0000 mg | ORAL_TABLET | ORAL | Status: DC | PRN

## 2012-02-12 MED ORDER — PHENOL 1.4 % MT LIQD: 1.0000 | OROMUCOSAL | Status: DC | PRN

## 2012-02-12 MED ORDER — ZOLPIDEM TARTRATE 5 MG PO TABS: 5.0000 mg | ORAL_TABLET | Freq: Every evening | ORAL | Status: DC | PRN

## 2012-02-12 MED ORDER — MENTHOL 3 MG MT LOZG: 1.0000 | LOZENGE | OROMUCOSAL | Status: DC | PRN

## 2012-02-12 SURGICAL SUPPLY — 57 items
APL SKNCLS STERI-STRIP NONHPOA (GAUZE/BANDAGES/DRESSINGS) ×1
BENZOIN TINCTURE PRP APPL 2/3 (GAUZE/BANDAGES/DRESSINGS) ×2 IMPLANT
BLADE SURG ROTATE 9660 (MISCELLANEOUS) IMPLANT
BUR ACORN 6.0 (BURR) ×1 IMPLANT
BUR MATCHSTICK NEURO 3.0 LAGG (BURR) ×2 IMPLANT
CANISTER SUCTION 2500CC (MISCELLANEOUS) ×2 IMPLANT
CLOTH BEACON ORANGE TIMEOUT ST (SAFETY) ×2 IMPLANT
CONT SPEC 4OZ CLIKSEAL STRL BL (MISCELLANEOUS) ×2 IMPLANT
DRAPE LAPAROTOMY 100X72X124 (DRAPES) ×2 IMPLANT
DRAPE MICROSCOPE LEICA (MISCELLANEOUS) ×1 IMPLANT
DRAPE POUCH INSTRU U-SHP 10X18 (DRAPES) ×2 IMPLANT
DRSG PAD ABDOMINAL 8X10 ST (GAUZE/BANDAGES/DRESSINGS) IMPLANT
DURAPREP 26ML APPLICATOR (WOUND CARE) ×2 IMPLANT
ELECT REM PT RETURN 9FT ADLT (ELECTROSURGICAL) ×2
ELECTRODE REM PT RTRN 9FT ADLT (ELECTROSURGICAL) ×1 IMPLANT
GAUZE SPONGE 4X4 16PLY XRAY LF (GAUZE/BANDAGES/DRESSINGS) IMPLANT
GLOVE BIO SURGEON STRL SZ8 (GLOVE) ×1 IMPLANT
GLOVE BIOGEL M 8.0 STRL (GLOVE) ×2 IMPLANT
GLOVE BIOGEL PI IND STRL 7.5 (GLOVE) IMPLANT
GLOVE BIOGEL PI INDICATOR 7.5 (GLOVE) ×1
GLOVE ECLIPSE 7.5 STRL STRAW (GLOVE) ×2 IMPLANT
GLOVE EXAM NITRILE LRG STRL (GLOVE) IMPLANT
GLOVE EXAM NITRILE MD LF STRL (GLOVE) IMPLANT
GLOVE EXAM NITRILE XL STR (GLOVE) IMPLANT
GLOVE EXAM NITRILE XS STR PU (GLOVE) IMPLANT
GOWN BRE IMP SLV AUR LG STRL (GOWN DISPOSABLE) ×1 IMPLANT
GOWN BRE IMP SLV AUR XL STRL (GOWN DISPOSABLE) ×3 IMPLANT
GOWN STRL REIN 2XL LVL4 (GOWN DISPOSABLE) IMPLANT
KIT BASIN OR (CUSTOM PROCEDURE TRAY) ×2 IMPLANT
KIT ROOM TURNOVER OR (KITS) ×2 IMPLANT
NDL HYPO 18GX1.5 BLUNT FILL (NEEDLE) IMPLANT
NDL HYPO 21X1.5 SAFETY (NEEDLE) IMPLANT
NDL HYPO 25X1 1.5 SAFETY (NEEDLE) IMPLANT
NDL SPNL 20GX3.5 QUINCKE YW (NEEDLE) IMPLANT
NEEDLE HYPO 18GX1.5 BLUNT FILL (NEEDLE) IMPLANT
NEEDLE HYPO 21X1.5 SAFETY (NEEDLE) IMPLANT
NEEDLE HYPO 25X1 1.5 SAFETY (NEEDLE) ×2 IMPLANT
NEEDLE SPNL 20GX3.5 QUINCKE YW (NEEDLE) ×2 IMPLANT
NS IRRIG 1000ML POUR BTL (IV SOLUTION) ×2 IMPLANT
PACK LAMINECTOMY NEURO (CUSTOM PROCEDURE TRAY) ×2 IMPLANT
PAD ARMBOARD 7.5X6 YLW CONV (MISCELLANEOUS) ×6 IMPLANT
PATTIES SURGICAL .5 X1 (DISPOSABLE) ×2 IMPLANT
RUBBERBAND STERILE (MISCELLANEOUS) ×4 IMPLANT
SPONGE GAUZE 4X4 12PLY (GAUZE/BANDAGES/DRESSINGS) ×2 IMPLANT
SPONGE LAP 4X18 X RAY DECT (DISPOSABLE) IMPLANT
SPONGE SURGIFOAM ABS GEL SZ50 (HEMOSTASIS) ×2 IMPLANT
STRIP CLOSURE SKIN 1/2X4 (GAUZE/BANDAGES/DRESSINGS) ×2 IMPLANT
SUT VIC AB 0 CT1 18XCR BRD8 (SUTURE) ×1 IMPLANT
SUT VIC AB 0 CT1 8-18 (SUTURE) ×2
SUT VIC AB 2-0 CP2 18 (SUTURE) ×2 IMPLANT
SUT VIC AB 3-0 SH 8-18 (SUTURE) ×2 IMPLANT
SYR 20CC LL (SYRINGE) IMPLANT
SYR 20ML ECCENTRIC (SYRINGE) ×2 IMPLANT
SYR 5ML LL (SYRINGE) IMPLANT
TOWEL OR 17X24 6PK STRL BLUE (TOWEL DISPOSABLE) ×2 IMPLANT
TOWEL OR 17X26 10 PK STRL BLUE (TOWEL DISPOSABLE) ×2 IMPLANT
WATER STERILE IRR 1000ML POUR (IV SOLUTION) ×2 IMPLANT

## 2012-02-12 NOTE — Transfer of Care (Signed)
Immediate Anesthesia Transfer of Care Note  Patient: Ryan [name]  Procedure(s) Performed: Procedure(s) (LRB): LUMBAR LAMINECTOMY/DECOMPRESSION MICRODISCECTOMY (N/A)  Patient Location: PACU  Anesthesia Type: General  Level of Consciousness: awake, alert , oriented and patient cooperative  Airway & Oxygen Therapy: Patient Spontanous Breathing and Patient connected to face mask oxygen  Post-op Assessment: Report given to PACU RN  Post vital signs: Reviewed and stable  Complications: No apparent anesthesia complications

## 2012-02-12 NOTE — Preoperative (Signed)
Beta Blockers   Reason not to administer Beta Blockers:Not Applicable 

## 2012-02-12 NOTE — H&P (Signed)
Ryan [name] is an 71 y.o. male.   Chief Complaint: lbp HPI: lbp with radiation to both lower extremities for several years. Patient had 2 lumbar interventions in the past. He gets relief of the pain with flexion of the spine.  Past Medical History  Diagnosis Date  . Hypertension   . Arthritis   . Myocardial infarction     29    dr Clelia Croft PCP          . Hyperlipidemia   . Hyperthyroidism   . Peripheral vascular disease     Past Surgical History  Procedure Date  . Femoral artery - popliteal artery bypass graft   . Joint replacement     shoulder  . Back surgery     lumber   x  3  . Cardiac catheterization     2010    dr Elease Hashimoto    History reviewed. No pertinent family history. Social History:  reports that he quit smoking about 21 years ago. His smoking use included Cigarettes. He does not have any smokeless tobacco history on file. He reports that he drinks about .6 ounces of alcohol per week. He reports that he does not use illicit drugs.  Allergies:  Allergies  Allergen Reactions  . Codeine Nausea And Vomiting    REACTION: nausea, vomiting    Medications Prior to Admission  Medication Dose Route Frequency Provider Last Rate Last Dose  . ceFAZolin (ANCEF) IVPB 2 g/50 mL premix  2 g Intravenous 30 min Pre-Op [name], MD       No current outpatient prescriptions on file as of 02/12/2012.    No results found for this or any previous visit (from the past 48 hour(s)). No results found.  Review of Systems  Constitutional: Negative.   HENT: Negative.   Eyes: Negative.   Respiratory: Negative.   Cardiovascular:       Coronary bypass  Gastrointestinal: Negative.   Genitourinary: Negative.   Musculoskeletal: Positive for back pain.  Skin: Negative.   Neurological: Positive for focal weakness.  Endo/Heme/Allergies: Negative.   Psychiatric/Behavioral: Negative.     Blood pressure 156/76, pulse 56, temperature 97.8 F (36.6 C), temperature source Oral,  resp. rate 18, SpO2 99.00%. Physical Examhent, nl. Neck,nl, cv, midline scar . Lungs nl. Abdomen, nl, extremities ,scar left shoulder. NEURO df weakness both feet. Slr, positive bilaterally at 60. Femoral maneuver, negative.. mti showed several stenosis at l45. 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 He and wife aware of risks including csf leak, need of further surgery  Ryan [name] M 02/12/2012, 4:40 PM

## 2012-02-12 NOTE — Progress Notes (Signed)
l4 5 laminectomies was done under the microscope.number [phone]

## 2012-02-12 NOTE — Anesthesia Postprocedure Evaluation (Signed)
Anesthesia Post Note  Patient: Ryan [name]  Procedure(s) Performed: Procedure(s) (LRB): LUMBAR LAMINECTOMY/DECOMPRESSION MICRODISCECTOMY (N/A)  Anesthesia type: general  Patient location: PACU  Post pain: Pain level controlled  Post assessment: Patient's Cardiovascular Status Stable  Last Vitals:  Filed Vitals:   02/12/12 1830  BP: 161/58  Pulse: 95  Temp: 36 C  Resp: 12    Post vital signs: Reviewed and stable  Level of consciousness: sedated  Complications: No apparent anesthesia complications

## 2012-02-12 NOTE — Consult Note (Signed)
Anesthesia:  Patient is a 71 year old male scheduled for a lumbar 4-5 laminectomy today.  His PAT appointment was on 02/05/12.  I was not asked to review his chart until today.    His history is significant for MI X 2 in the mid-1980's.  He last was seen by Cardiologist Dr. Elease Hashimoto in March of 2010 and had a cardiac cath that showed: Moderate diffuse coronary artery disease (30% prox LAD, 40-50% mid LAD, 50-60% distal LAD, mild-mod D1 disease, 30-40% LCX with distal occlusion and left to left collaterals, prox RCA occlusion). He has 2 known myocardial infarctions, and now we have been able to identify that these were due to an occlusion of the right coronary artery and the distal left circumflex artery. He has moderate lesions elsewhere and none of these are severe enough to warrant revascularization. He has well-preserved  left ventricular systolic function. 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 There is no evidence of thrombus. EF ~50%.  Other history includes HTN, HLD, hypothyroidism, arthritis, PVD s/p AFBG '87, carotid artery stenosis (1-39% right ICA, 60-79% left ICA) former smoker.  His meds include Flomax, ASA, Welchol, Synthroid, Bystolic.  PCP is Dr. Martha Clan at [facility]  EKG from 02/05/12 shows NSR, poor r wave progression.  No significant ST/T wave abnormalities are noted.  CXR shows: 1. Mild cardiomegaly. No active lung disease.  2. Left humeral head prosthesis with broken screw in the inferior left glenoid region.  Labs acceptable.  He reports fairly classic angina at the time of his heart attacks, which he has not had since.  He denies CP/SOB.  Up until two months ago he was walking two miles, although he was having to rest for a few seconds every 50-100 yards due to leg and hip pain.  Exam shows RRR, lungs clear, no LE edema.  He has no acute ischemic EKG changes or ischemic symptoms and exercise tolerance is reasonable, so anticipate he can proceed.  He  was encouraged to discuss re-establishing Cardiology follow-up with Dr. Clelia Croft.  Anesthesiologist Dr. Ivin Booty updated.  He will evaluate patient in [place].

## 2012-02-12 NOTE — Anesthesia Procedure Notes (Signed)
Procedure Name: Intubation Date/Time: 02/12/2012 5:25 PM Performed by: Glendora Score Pre-anesthesia Checklist: Patient identified, Emergency Drugs available, Suction available and Patient being monitored Patient Re-evaluated:Patient Re-evaluated prior to inductionOxygen Delivery Method: Circle system utilized Preoxygenation: Pre-oxygenation with 100% oxygen Intubation Type: IV induction Ventilation: Mask ventilation without difficulty Laryngoscope Size: Miller and 2 Grade View: Grade I Tube type: Oral Tube size: 7.5 mm Number of attempts: 1 Airway Equipment and Method: Stylet Placement Confirmation: ETT inserted through vocal cords under direct vision,  positive ETCO2 and breath sounds checked- equal and bilateral Secured at: 23 cm Tube secured with: Tape Dental Injury: Teeth and Oropharynx as per pre-operative assessment

## 2012-02-12 NOTE — Anesthesia Preprocedure Evaluation (Signed)
Anesthesia Evaluation  Patient identified by MRN, date of birth, ID band Patient awake    Reviewed: Allergy & Precautions, H&P , NPO status , Patient's Chart, lab work & pertinent test results  Airway Mallampati: I TM Distance: >3 FB Neck ROM: Full    Dental  (+) Teeth Intact   Pulmonary  clear to auscultation        Cardiovascular + CAD Regular Normal    Neuro/Psych    GI/Hepatic   Endo/Other    Renal/GU      Musculoskeletal   Abdominal   Peds  Hematology   Anesthesia Other Findings   Reproductive/Obstetrics                           Anesthesia Physical Anesthesia Plan  ASA: III  Anesthesia Plan: General   Post-op Pain Management:    Induction: Intravenous  Airway Management Planned: Oral ETT  Additional Equipment:   Intra-op Plan:   Post-operative Plan: Extubation in OR  Informed Consent: I have reviewed the patients History and Physical, chart, labs and discussed the procedure including the risks, benefits and alternatives for the proposed anesthesia with the patient or authorized representative who has indicated his/her understanding and acceptance.   Dental advisory given  Plan Discussed with: CRNA, Anesthesiologist and Surgeon  Anesthesia Plan Comments:         Anesthesia Quick Evaluation

## 2012-02-13 NOTE — Discharge Summary (Signed)
Physician Discharge Summary  Patient ID: Ryan [name] MRN: [id number] DOB/AGE: [id number] y.o.  Admit date: 02/12/2012 Discharge date: 02/13/2012  Admission Diagnoses: lumbar stenosis    Discharge Diagnoses: same   Discharged Condition: good  Hospital Course: The patient was admitted on 02/12/2012 and taken to the operating room where the patient underwent DLL. The patient tolerated the procedure well and was taken to the recovery room and then to the floor in stable condition. The hospital course was routine. There were no complications. The wound remained clean dry and intact. The patient remained afebrile with stable vital signs, and tolerated a regular diet. The patient continued to increase activities, and pain was well controlled with oral pain medications.   Consults: None  Significant Diagnostic Studies:  Results for orders placed during the hospital encounter of 02/05/12  CBC      Component Value Range   WBC 9.6  4.0 - 10.5 (K/uL)   RBC 4.68  4.22 - 5.81 (MIL/uL)   Hemoglobin 15.0  13.0 - 17.0 (g/dL)   HCT 40.9  81.1 - 91.4 (%)   MCV 92.7  78.0 - 100.0 (fL)   MCH 32.1  26.0 - 34.0 (pg)   MCHC 34.6  30.0 - 36.0 (g/dL)   RDW 78.2  95.6 - 21.3 (%)   Platelets 293  150 - 400 (K/uL)  BASIC METABOLIC PANEL      Component Value Range   Sodium 139  135 - 145 (mEq/L)   Potassium 4.7  3.5 - 5.1 (mEq/L)   Chloride 104  96 - 112 (mEq/L)   CO2 25  19 - 32 (mEq/L)   Glucose, Bld 117 (*) 70 - 99 (mg/dL)   BUN 14  6 - 23 (mg/dL)   Creatinine, Ser 0.86  0.50 - 1.35 (mg/dL)   Calcium 9.7  8.4 - 57.8 (mg/dL)   GFR calc non Af Amer 82 (*) >90 (mL/min)   GFR calc Af Amer >90  >90 (mL/min)  SURGICAL PCR SCREEN      Component Value Range   MRSA, PCR NEGATIVE  NEGATIVE    Staphylococcus aureus NEGATIVE  NEGATIVE     Dg Chest 2 View  02/05/2012  *RADIOLOGY REPORT*  Clinical Data: History of spinal stenosis, thoracic and lumbar sacral neuritis and radiculitis  CHEST - 2 VIEW   Comparison: None.  Findings: The lungs are clear.  Mediastinal contours are normal. The heart is mildly enlarged.  There are degenerative changes throughout the thoracic spine.  A left humeral head replacement is noted and there is a broken screw within the inferior left glenoid region.  IMPRESSION:  1.  Mild cardiomegaly.  No active lung disease. 2.  Left humeral head prosthesis with broken screw in the inferior left glenoid region.  Original Report Authenticated By: Juline Patch, M.D.   Dg Lumbar Spine 1 View  02/12/2012  *RADIOLOGY REPORT*  Clinical Data: 71 year old male undergoing lumbar surgery.  LUMBAR SPINE - 1 VIEW  Comparison: [facility] lumbar radiographs 01/21/2012.  [facility] lumbar MRI 12/04/2011.  Findings: Intraoperative portable cross-table lateral view lumbar spine at 1758 hours.  Normal lumbar segmentation noted on the comparison.  Cephalad surgical probe at the L4 pedicle level. Caudal surgical probe at the L5 pedicle level.  IMPRESSION: Intraoperative localization as above.  Original Report Authenticated By: Harley Hallmark, M.D.    Antibiotics:  Anti-infectives     Start     Dose/Rate Route Frequency Ordered Stop  02/13/12 0130   ceFAZolin (ANCEF) IVPB 1 g/50 mL premix        1 g 100 mL/hr over 30 Minutes Intravenous Every 8 hours 02/12/12 2002 02/13/12 1729   02/12/12 0000   ceFAZolin (ANCEF) IVPB 2 g/50 mL premix  Status:  Discontinued        2 g 100 mL/hr over 30 Minutes Intravenous 30 min pre-op 02/11/12 1537 02/12/12 1953          Discharge Exam: Blood pressure 119/61, pulse 74, temperature 98.2 F (36.8 C), temperature source Oral, resp. rate 18, height 5\' 7"  (1.702 m), weight 85.186 kg (187 lb 12.8 oz), SpO2 95.00%. Neurologic: Grossly normal  Discharge Medications:   Medication List  As of 02/13/2012  9:54 AM   TAKE these medications         aspirin EC 81 MG tablet   Take 81 mg by mouth daily.       colesevelam 625 MG tablet   Commonly known as: WELCHOL   Take 1,250 mg by mouth 2 (two) times daily with a meal.      docusate sodium 100 MG capsule   Commonly known as: COLACE   Take 100 mg by mouth daily.      levothyroxine 75 MCG tablet   Commonly known as: SYNTHROID, LEVOTHROID   Take 75 mcg by mouth daily.      nebivolol 10 MG tablet   Commonly known as: BYSTOLIC   Take 10 mg by mouth daily.      Tamsulosin HCl 0.4 MG Caps   Commonly known as: FLOMAX   Take 0.4 mg by mouth daily.            Disposition: home   Final Dx: decompressive lum lam  Discharge Orders    Future Appointments: Provider: Department: Dept Phone: Center:   07/04/2012 9:00 AM [facility] [phone] [facility]     Future Orders Please Complete By Expires   Diet - low sodium heart healthy      Increase activity slowly      Driving Restrictions      Comments:   1 week   Lifting restrictions      Comments:   Less than 10 lbs   Remove dressing in 48 hours      Call MD for:  temperature >100.4      Call MD for:  persistant nausea and vomiting      Call MD for:  severe uncontrolled pain      Call MD for:  redness, tenderness, or signs of infection (pain, swelling, redness, odor or green/yellow discharge around incision site)      Call MD for:  difficulty breathing, headache or visual disturbances         Follow-up Information    Follow up with [name], MD in 2 weeks.   Contact information:   [address] [place] [address] [phone]           Signed: Tia Alert 02/13/2012, 9:54 AM

## 2012-02-13 NOTE — Progress Notes (Signed)
Patient admitted to unit room 3035 at 2000. Alert and oriented. Neuro intact. dsg to lower back intact with small stain. Patient voided twice with little output. Bladder scanned around 0400 and reads 579 ml. In and out cath done with output. Scanned again with 20 ml residual. Patient was given Flomax at bedtime per schedule

## 2012-02-13 NOTE — Progress Notes (Signed)
Physical Therapy Evaluation Patient Details Name: Ryan [name] MRN: [id number] DOB: 1941-11-24 Today's Date: 02/13/2012  Problem List: There is no problem list on file for this patient.   Past Medical History:  Past Medical History  Diagnosis Date  . Hypertension   . Arthritis   . Myocardial infarction     49    dr Clelia Croft PCP          . Hyperlipidemia   . Hyperthyroidism   . Peripheral vascular disease    Past Surgical History:  Past Surgical History  Procedure Date  . Femoral artery - popliteal artery bypass graft   . Joint replacement     shoulder  . Back surgery     lumber   x  3  . Cardiac catheterization     2010    dr Elease Hashimoto    02/13/12 0900  PT Visit Information  Last PT Received On 02/13/12  Precautions  Precautions Back  Precaution Booklet Issued Yes (comment)  Precaution Comments pt educated on 3/3 back precautions  Required Braces or Orthoses Yes  Spinal Brace Lumbar corset  Restrictions  Weight Bearing Restrictions No  Home Living  Lives With Spouse  Receives Help From Family  Type of Home House  Home Layout Two level;Able to live on main level with bedroom/bathroom  Alternate Level Stairs-Rails Right  Alternate Level Stairs-Number of Steps 17  Home Access Stairs to enter  Entrance Stairs-Rails None  Entrance Stairs-Number of Steps 3  Bathroom Shower/Tub Walk-in shower;Door  Horticulturist, commercial Yes  How Accessible Accessible via walker  Home Adaptive Equipment None  Prior Function  Level of Independence Independent with basic ADLs;Independent with homemaking with ambulation;Independent with gait;Independent with transfers  Able to Take Stairs? Yes  Driving Yes  Vocation Part time employment  Cognition  Arousal/Alertness Awake/alert  Overall Cognitive Status Appears within functional limits for tasks assessed  Orientation Level Oriented X4  Sensation  Light Touch Appears Intact  Bed Mobility  Bed Mobility  Yes  Rolling Right 5: Supervision  Rolling Right Details (indicate cue type and reason) VC for proper sequencing to maintain back precautions  Right Sidelying to Sit 5: Supervision  Right Sidelying to Sit Details (indicate cue type and reason) VC for proper technique to maintain back precautions. No physical assist needed  Sitting - Scoot to Edge of Bed 6: Modified independent (Device/Increase time)  Transfers  Transfers Yes  Sit to Stand 5: Supervision  Sit to Stand Details (indicate cue type and reason) VC for hand placement for safety  Stand to Sit 4: Min assist;With upper extremity assist;To chair/3-in-1  Stand to Sit Details VC for hand placement and sequencing to maintain back precautions  Ambulation/Gait  Ambulation/Gait Yes  Ambulation/Gait Assistance 5: Supervision  Ambulation/Gait Assistance Details (indicate cue type and reason) VC for proper gait sequencing. No physical assist needed, supervision for safety  Ambulation Distance (Feet) 200 Feet  Assistive device None  Gait Pattern Step-to pattern;Decreased stride length  Gait velocity Decreased cademce  Stairs Yes  Stairs Assistance 5: Supervision  Stairs Assistance Details (indicate cue type and reason) VC for proper stair sequencing  Stair Management Technique No rails;Forwards  Number of Stairs 3   Height of Stairs 4   RLE Assessment  RLE Assessment WFL  LLE Assessment  LLE Assessment WFL  PT - End of Session  Equipment Utilized During Treatment Gait belt  Activity Tolerance Patient tolerated treatment well  Patient left in chair;with call bell  in reach  Nurse Communication Mobility status for transfers;Mobility status for ambulation  General  Behavior During Session [facility] for tasks performed  Cognition [facility] for tasks performed  PT Assessment  Clinical Impression Statement Pt presents with a medical diagnosis of L4-5 laminectomy. Pt is at a mod I-supervision level for all mobility. No further PT needs identified.  Please re-order if necessary  PT Recommendation/Assessment Patent does not need any further PT services  No Skilled PT All education completed;Patient is supervision for all activity/mobility  PT Recommendation  Follow Up Recommendations No PT follow up;Supervision - Intermittent  Equipment Recommended None recommended by PT    02/13/2012 Milana Kidney DPT PAGER: [phone] OFFICE: [phone]

## 2012-02-13 NOTE — Progress Notes (Signed)
Order received, chart reviewed, spoke to PT who felt like pt without OT needs. Spoke to pt briefly and he feels he can manage between him and his wife with BADLs. Pt has bars around toilet and walk-in shower, able to cross legs almost for socks (says wife can A). Pt made further aware of back precautions on sheet in his room and how to best avoid the twisting with toiletting hygiene. Time spent with pt 5 minutes--no charge.  Ignacia Palma, [place] [phone] 02/13/2012

## 2012-02-13 NOTE — Op Note (Signed)
Ryan [name], Ryan [name] NO.:  [id number]  MEDICAL RECORD NO.:  [id number]  LOCATION:  3035                         FACILITY:  [facility]  PHYSICIAN:  Hilda Lias, M.D.   DATE OF BIRTH:  03/13/1941  DATE OF PROCEDURE:  02/12/2012 DATE OF DISCHARGE:                              OPERATIVE REPORT   PREOPERATIVE DIAGNOSIS:  L4-5 stenosis with chronic radiculopathy, status post two lumbar surgeries 25 years ago.  POSTOPERATIVE DIAGNOSIS:  L4-5 stenosis with chronic radiculopathy, status post two lumbar surgeries 25 years ago.  PROCEDURE:  Bilateral L4-5 laminectomy, foraminotomy, lysis of adhesions.  Microscope.  SURGEON:  Hilda Lias, MD  ASSISTANT:  Tia Alert, MD  CLINICAL HISTORY:  The patient was admitted because of back pain worsened to both legs.  The patient has failed conservative treatment. The patient in the 17s has had two lumbar diskectomies.  X-rays showed severe osteoarthritis with stenosis at the level of L4-5.  Surgery was advised and the risks were explained to him including the possibility of no improvement and need for further surgery.  PROCEDURE:  The patient was taken to the OR, and after intubation, he was positioned in a prone manner.  The skin was cleaned with DuraPrep. Drapes were applied.  Midline incision following the previous one was made through the skin and subcutaneous tissue was straight down to the lumbar spine.  By palpation, identified the L5-S1 and L4-5 space.  We proceeded with removal of the spinous process of L4-L5, and with the help of the microscope and the drill, we drilled the lamina of L4 bilaterally.  X-rays showed that indeed we were right at the level of L4- L5.  In the left side, the patient has quite a bit of thickening of the ligament.  In the right side, not only he has a calcified ligament mostly at the level of L4-5 going into the midline, but he has also thickening of the ligament from previous  surgery.  Foraminotomy was done to decompress the L4, L5, and S1 nerve roots bilaterally.  At the end, we had plenty of space not only for the thecal sac, but for the foramen. From then on, the area was irrigated.  Valsalva maneuver was negative. Then, the wound was closed with Vicryl and Steri-Strips.          ______________________________ Hilda Lias, M.D.     EB/MEDQ  D:  02/12/2012  T:  02/13/2012  Job:  [id number]

## 2012-02-13 NOTE — Plan of Care (Signed)
Problem: Consults Goal: Diagnosis - Spinal Surgery Outcome: Completed/Met Date Met:  02/13/12 Lumbar Laminectomy (Complex)

## 2012-02-15 ENCOUNTER — Emergency Department (HOSPITAL_COMMUNITY)
Admission: EM | Admit: 2012-02-15 | Discharge: 2012-02-15 | Disposition: A | Discharge status: Home Health | Payer: Medicare Other | Attending: Emergency Medicine | Admitting: Emergency Medicine

## 2012-02-15 ENCOUNTER — Encounter (HOSPITAL_COMMUNITY): Payer: Self-pay | Admitting: *Deleted

## 2012-02-15 DIAGNOSIS — E785 Hyperlipidemia, unspecified: Secondary | ICD-10-CM | POA: Diagnosis not present

## 2012-02-15 DIAGNOSIS — G8918 Other acute postprocedural pain: Secondary | ICD-10-CM | POA: Diagnosis not present

## 2012-02-15 DIAGNOSIS — I10 Essential (primary) hypertension: Secondary | ICD-10-CM | POA: Insufficient documentation

## 2012-02-15 DIAGNOSIS — Z79899 Other long term (current) drug therapy: Secondary | ICD-10-CM | POA: Diagnosis not present

## 2012-02-15 DIAGNOSIS — M129 Arthropathy, unspecified: Secondary | ICD-10-CM | POA: Diagnosis not present

## 2012-02-15 DIAGNOSIS — R109 Unspecified abdominal pain: Secondary | ICD-10-CM | POA: Diagnosis not present

## 2012-02-15 DIAGNOSIS — M549 Dorsalgia, unspecified: Secondary | ICD-10-CM | POA: Insufficient documentation

## 2012-02-15 DIAGNOSIS — I252 Old myocardial infarction: Secondary | ICD-10-CM | POA: Insufficient documentation

## 2012-02-15 DIAGNOSIS — R339 Retention of urine, unspecified: Secondary | ICD-10-CM | POA: Diagnosis not present

## 2012-02-15 DIAGNOSIS — R6883 Chills (without fever): Secondary | ICD-10-CM | POA: Insufficient documentation

## 2012-02-15 DIAGNOSIS — Z7982 Long term (current) use of aspirin: Secondary | ICD-10-CM | POA: Insufficient documentation

## 2012-02-15 DIAGNOSIS — R110 Nausea: Secondary | ICD-10-CM | POA: Insufficient documentation

## 2012-02-15 DIAGNOSIS — I739 Peripheral vascular disease, unspecified: Secondary | ICD-10-CM | POA: Diagnosis not present

## 2012-02-15 LAB — URINALYSIS, ROUTINE W REFLEX MICROSCOPIC
Ketones, ur: NEGATIVE mg/dL
Leukocytes, UA: NEGATIVE
Nitrite: NEGATIVE
Protein, ur: NEGATIVE mg/dL
Urobilinogen, UA: 0.2 mg/dL (ref 0.0–1.0)
pH: 6 (ref 5.0–8.0)

## 2012-02-15 LAB — BASIC METABOLIC PANEL
BUN: 12 mg/dL (ref 6–23)
Calcium: 9.3 mg/dL (ref 8.4–10.5)
Creatinine, Ser: 0.84 mg/dL (ref 0.50–1.35)
GFR calc Af Amer: >90 mL/min (ref >90)
GFR calc non Af Amer: 87 mL/min — ABNORMAL LOW (ref >90)
Glucose, Bld: 112 mg/dL — ABNORMAL HIGH (ref 70–99)

## 2012-02-15 LAB — CBC
HCT: 40.8 % (ref 39.0–52.0)
Hemoglobin: 14.2 g/dL (ref 13.0–17.0)
MCH: 32.3 pg (ref 26.0–34.0)
MCHC: 34.8 g/dL (ref 30.0–36.0)
MCV: 92.9 fL (ref 78.0–100.0)
RDW: 12 % (ref 11.5–15.5)

## 2012-02-15 LAB — URINE MICROSCOPIC-ADD ON

## 2012-02-15 MED ORDER — SODIUM CHLORIDE 0.9 % IV BOLUS (SEPSIS)
100.0000 mL | Freq: Once | INTRAVENOUS | Status: AC
  Administered 2012-02-15: 1000 mL via INTRAVENOUS

## 2012-02-15 MED ORDER — ONDANSETRON HCL 4 MG/2ML IJ SOLN
4.0000 mg | Freq: Once | INTRAMUSCULAR | Status: AC
  Administered 2012-02-15: 4 mg via INTRAMUSCULAR
  Filled 2012-02-15: qty 2

## 2012-02-15 MED ORDER — MORPHINE SULFATE 4 MG/ML IJ SOLN
4.0000 mg | Freq: Once | INTRAMUSCULAR | Status: AC
  Administered 2012-02-15: 4 mg via INTRAVENOUS
  Filled 2012-02-15: qty 1

## 2012-02-15 NOTE — ED Notes (Signed)
Pt and wife verbalizes care of foley cath and leg bad while at home

## 2012-02-15 NOTE — ED Provider Notes (Signed)
History     CSN: [id number]  Arrival date & time 02/15/12  1336   First MD Initiated Contact with Patient 02/15/12 1522      Chief Complaint  Patient presents with  . Urinary Retention    (Consider location/radiation/quality/duration/timing/severity/associated sxs/prior treatment) HPI  Pt presents to the ED with complaints of urian ry retention.The patient had back surgery on Friday and was discharged same day. Dr. Phineas Semen did the surgery and had informed the patient that he would be staying the hospital and discharged on Sunday. The patient states the he is having back pain and has been feeling sick, nauseous with chills. He has also been having lower abdominal bladder pain which relieved after we catheterized him in the ED. The patient states he has been unable to pass any urine since leaving the hospital Friday and he denies a history of being unable to pass urine.  Past Medical History  Diagnosis Date  . Hypertension   . Arthritis   . Myocardial infarction     87    dr shaw PCP          . Hyperlipidemia   . Hyperthyroidism   . Peripheral vascular disease     Past Surgical History  Procedure Date  . Femoral artery - popliteal artery bypass graft   . Joint replacement     shoulder  . Back surgery     lumber   x  3  . Cardiac catheterization     20 10    dr nahser    No family history on file.  History  Substance Use Topics  . Smoking status: Former Smoker    Types: Cigarettes    Quit date: 02/04/1991  . Smokeless tobacco: Not on file  . Alcohol Use: 0.6 oz/week    1 Cans of beer per week     occ      Review of Systems  All other systems reviewed and are negative.    Allergies  Codeine  Home Medications   Current Outpatient Rx  Name Route Sig Dispense Refill  . ASPIRIN EC 81 MG PO TBEC Oral Take 81 mg by mouth daily.    . COLESEVELAM HCL 625 MG PO TABS Oral Take 1,250 mg by mouth 2 (two) times daily with a meal.    . DOCUSATE SODIUM 100 MG PO CAPS  Oral Take 100 mg by mouth daily.    Marland Kitchen LEVOTHYROXINE SODIUM 75 MCG PO TABS Oral Take 75 mcg by mouth daily.    . NEBIVOLOL HCL 10 MG PO TABS Oral Take 10 mg by mouth daily.    Marland Kitchen OVER THE COUNTER MEDICATION Oral Take 3 tablets by mouth once as needed. Over the counter laxative.    Marland Kitchen TAMSULOSIN HCL 0.4 MG PO CAPS Oral Take 0.4 mg by mouth daily.      BP 153/74  Pulse 72  Temp(Src) 97.9 F (36.6 C) (Oral)  Resp 16  SpO2 98%  Physical Exam  Nursing note and vitals reviewed. Constitutional: He is oriented to person, place, and time. He appears well-developed and well-nourished. No distress.  HENT:  Head: Normocephalic and atraumatic.  Eyes: Pupils are equal, round, and reactive to light.  Neck: Normal range of motion. Neck supple.  Cardiovascular: Normal rate and regular rhythm.   Pulmonary/Chest: Effort normal.  Abdominal: Soft.  Neurological: He is oriented to person, place, and time. He has normal strength. No cranial nerve deficit or sensory deficit.  Skin: Skin is warm and dry.  ED Course  Procedures (including critical care time)  Labs Reviewed  URINALYSIS, ROUTINE W REFLEX MICROSCOPIC - Abnormal; Notable for the following:    Hgb urine dipstick MODERATE (*)    All other components within normal limits  CBC - Abnormal; Notable for the following:    WBC 14.3 (*)    All other components within normal limits  BASIC METABOLIC PANEL - Abnormal; Notable for the following:    Glucose, Bld 112 (*)    GFR calc non Af Amer 87 (*)    All other components within normal limits  URINE MICROSCOPIC-ADD ON   No results found.   1. Urinary retention   2. Post-operative pain       MDM  Dr. Tanna Savoy has admitted the patient to Dr. Jeral Fruit for pain control and urinary retention.        Dorthula Matas, PA 02/15/12 [phone]

## 2012-02-15 NOTE — ED Notes (Signed)
Pt st's feels better after pain med given, pt resting with wife at bedside.

## 2012-02-15 NOTE — ED Notes (Signed)
Pt st's he had back surg on Fri.  St's sat he started having problems emptying his bladder.  Pt c/o back pain and nausea off and on.  St's feels better after bladder emptied.

## 2012-02-15 NOTE — ED Notes (Signed)
Patient had back surgery on Friday night,  Discharged on Saturday.  The last time he voided a normal amount was Friday night.  He states he is burning in his groin.  Patient also reports he is bloated.  Patient states he is nauseated as well

## 2012-02-15 NOTE — ED Provider Notes (Signed)
Medical screening examination/treatment/procedure(s) were conducted as a shared visit with non-physician practitioner(s) and myself.  I personally evaluated the patient during the encounter Patient with persistent back pain after his back surgery on Friday and urinary retention today with over a liter out on catheterization. No infectious symptoms. UA negative for infection. Dr. Jeral Fruit came and saw the patient and states this is a common post surgical issue without any concern for cord compromise as he has no weakness or pain going down into his legs. Patient had a Foley placed and his doctor decided to send him home with follow.  Gwyneth Sprout, MD 02/15/12 [phone]

## 2012-02-15 NOTE — ED Notes (Signed)
Dr. Jeral Fruit in to assess pt. Verbal order obtained for foley cath and discharge home.  Pt st's feels much better.

## 2012-02-15 NOTE — Progress Notes (Signed)
Patient ID: Ryan [name], male   DOB: Feb 23, 1941, 71 y.o.   MRN: [id number] Patient underwent 2 levels lumbar laminectomies on 02/12/12. Discharge the following day. Now comes to the er unable to urinate. Had an i/o catheter with 1000cc of urine. Clinically, no pain, no weakness ,no sensory changes. slr negative at80.  He is on flomax.  Plan, i did speak with him and daughter Banker) and gave the choice between being admited to the hospital vs going home with foley catheter and to use laxatives and or fleet enema. They decided to go home. i will get an appointment with the urologist.

## 2012-02-16 ENCOUNTER — Encounter (HOSPITAL_COMMUNITY): Payer: Self-pay | Admitting: Neurosurgery

## 2012-02-17 DIAGNOSIS — R339 Retention of urine, unspecified: Secondary | ICD-10-CM | POA: Diagnosis not present

## 2012-02-17 DIAGNOSIS — N401 Enlarged prostate with lower urinary tract symptoms: Secondary | ICD-10-CM | POA: Diagnosis not present

## 2012-03-22 ENCOUNTER — Ambulatory Visit: Payer: Medicare Other

## 2012-03-30 ENCOUNTER — Other Ambulatory Visit: Payer: Self-pay | Admitting: Vascular Surgery

## 2012-04-06 ENCOUNTER — Ambulatory Visit: Payer: Medicare Other | Attending: Neurosurgery

## 2012-04-06 DIAGNOSIS — IMO0001 Reserved for inherently not codable concepts without codable children: Secondary | ICD-10-CM | POA: Insufficient documentation

## 2012-04-06 DIAGNOSIS — M2550 Pain in unspecified joint: Secondary | ICD-10-CM | POA: Insufficient documentation

## 2012-04-06 DIAGNOSIS — M545 Low back pain, unspecified: Secondary | ICD-10-CM | POA: Insufficient documentation

## 2012-04-06 DIAGNOSIS — R262 Difficulty in walking, not elsewhere classified: Secondary | ICD-10-CM | POA: Diagnosis not present

## 2012-04-14 ENCOUNTER — Ambulatory Visit: Payer: Medicare Other

## 2012-04-25 DIAGNOSIS — M545 Low back pain, unspecified: Secondary | ICD-10-CM | POA: Diagnosis not present

## 2012-05-19 DIAGNOSIS — Z125 Encounter for screening for malignant neoplasm of prostate: Secondary | ICD-10-CM | POA: Diagnosis not present

## 2012-05-19 DIAGNOSIS — E785 Hyperlipidemia, unspecified: Secondary | ICD-10-CM | POA: Diagnosis not present

## 2012-05-19 DIAGNOSIS — R7301 Impaired fasting glucose: Secondary | ICD-10-CM | POA: Diagnosis not present

## 2012-05-19 DIAGNOSIS — M109 Gout, unspecified: Secondary | ICD-10-CM | POA: Diagnosis not present

## 2012-05-19 DIAGNOSIS — E039 Hypothyroidism, unspecified: Secondary | ICD-10-CM | POA: Diagnosis not present

## 2012-05-19 DIAGNOSIS — I10 Essential (primary) hypertension: Secondary | ICD-10-CM | POA: Diagnosis not present

## 2012-05-20 DIAGNOSIS — M1610 Unilateral primary osteoarthritis, unspecified hip: Secondary | ICD-10-CM | POA: Diagnosis not present

## 2012-05-24 DIAGNOSIS — I10 Essential (primary) hypertension: Secondary | ICD-10-CM | POA: Diagnosis not present

## 2012-05-24 DIAGNOSIS — E785 Hyperlipidemia, unspecified: Secondary | ICD-10-CM | POA: Diagnosis not present

## 2012-05-24 DIAGNOSIS — I739 Peripheral vascular disease, unspecified: Secondary | ICD-10-CM | POA: Diagnosis not present

## 2012-05-24 DIAGNOSIS — Z0000 Encounter for general adult medical examination without abnormal findings: Secondary | ICD-10-CM | POA: Diagnosis not present

## 2012-05-25 DIAGNOSIS — M25559 Pain in unspecified hip: Secondary | ICD-10-CM | POA: Diagnosis not present

## 2012-05-25 DIAGNOSIS — Z1212 Encounter for screening for malignant neoplasm of rectum: Secondary | ICD-10-CM | POA: Diagnosis not present

## 2012-06-13 DIAGNOSIS — M76899 Other specified enthesopathies of unspecified lower limb, excluding foot: Secondary | ICD-10-CM | POA: Diagnosis not present

## 2012-07-04 ENCOUNTER — Other Ambulatory Visit: Payer: Medicare Other

## 2012-07-18 DIAGNOSIS — M545 Low back pain, unspecified: Secondary | ICD-10-CM | POA: Diagnosis not present

## 2012-07-21 ENCOUNTER — Other Ambulatory Visit: Payer: Self-pay | Admitting: Neurosurgery

## 2012-07-21 DIAGNOSIS — M25559 Pain in unspecified hip: Secondary | ICD-10-CM

## 2012-07-21 DIAGNOSIS — M545 Low back pain, unspecified: Secondary | ICD-10-CM

## 2012-07-28 ENCOUNTER — Ambulatory Visit
Admission: RE | Admit: 2012-07-28 | Discharge: 2012-07-28 | Disposition: A | Discharge status: Home / Self Care | Payer: Medicare Other | Source: Ambulatory Visit | Attending: Neurosurgery | Admitting: Neurosurgery

## 2012-07-28 DIAGNOSIS — M545 Low back pain, unspecified: Secondary | ICD-10-CM

## 2012-07-28 DIAGNOSIS — IMO0002 Reserved for concepts with insufficient information to code with codable children: Secondary | ICD-10-CM | POA: Diagnosis not present

## 2012-07-28 DIAGNOSIS — M25559 Pain in unspecified hip: Secondary | ICD-10-CM

## 2012-07-28 DIAGNOSIS — M76899 Other specified enthesopathies of unspecified lower limb, excluding foot: Secondary | ICD-10-CM | POA: Diagnosis not present

## 2012-08-29 ENCOUNTER — Encounter: Payer: Self-pay | Admitting: Cardiovascular Disease

## 2012-08-29 DIAGNOSIS — I739 Peripheral vascular disease, unspecified: Secondary | ICD-10-CM | POA: Diagnosis not present

## 2012-08-29 DIAGNOSIS — R7301 Impaired fasting glucose: Secondary | ICD-10-CM | POA: Diagnosis not present

## 2012-08-29 DIAGNOSIS — E039 Hypothyroidism, unspecified: Secondary | ICD-10-CM | POA: Diagnosis not present

## 2012-08-29 DIAGNOSIS — Z23 Encounter for immunization: Secondary | ICD-10-CM | POA: Diagnosis not present

## 2012-08-29 DIAGNOSIS — E785 Hyperlipidemia, unspecified: Secondary | ICD-10-CM | POA: Diagnosis not present

## 2012-08-29 DIAGNOSIS — I10 Essential (primary) hypertension: Secondary | ICD-10-CM | POA: Diagnosis not present

## 2012-08-29 DIAGNOSIS — I2510 Atherosclerotic heart disease of native coronary artery without angina pectoris: Secondary | ICD-10-CM | POA: Diagnosis not present

## 2012-09-12 DIAGNOSIS — M1610 Unilateral primary osteoarthritis, unspecified hip: Secondary | ICD-10-CM | POA: Diagnosis not present

## 2012-09-20 ENCOUNTER — Other Ambulatory Visit: Payer: Self-pay

## 2012-09-20 DIAGNOSIS — I6529 Occlusion and stenosis of unspecified carotid artery: Secondary | ICD-10-CM

## 2012-09-20 DIAGNOSIS — M79609 Pain in unspecified limb: Secondary | ICD-10-CM

## 2012-10-17 ENCOUNTER — Encounter: Payer: Self-pay | Admitting: Vascular Surgery

## 2012-10-18 ENCOUNTER — Other Ambulatory Visit (INDEPENDENT_AMBULATORY_CARE_PROVIDER_SITE_OTHER): Payer: Medicare Other | Admitting: *Deleted

## 2012-10-18 ENCOUNTER — Ambulatory Visit (INDEPENDENT_AMBULATORY_CARE_PROVIDER_SITE_OTHER): Payer: Medicare Other | Admitting: Vascular Surgery

## 2012-10-18 ENCOUNTER — Encounter: Payer: Self-pay | Admitting: Vascular Surgery

## 2012-10-18 ENCOUNTER — Encounter (INDEPENDENT_AMBULATORY_CARE_PROVIDER_SITE_OTHER): Payer: Medicare Other | Admitting: *Deleted

## 2012-10-18 VITALS — BP 153/72 | HR 67 | Resp 16 | Ht 67.0 in | Wt 184.0 lb

## 2012-10-18 DIAGNOSIS — Z48812 Encounter for surgical aftercare following surgery on the circulatory system: Secondary | ICD-10-CM

## 2012-10-18 DIAGNOSIS — I739 Peripheral vascular disease, unspecified: Secondary | ICD-10-CM

## 2012-10-18 DIAGNOSIS — I6529 Occlusion and stenosis of unspecified carotid artery: Secondary | ICD-10-CM | POA: Diagnosis not present

## 2012-10-18 DIAGNOSIS — M79609 Pain in unspecified limb: Secondary | ICD-10-CM

## 2012-10-18 DIAGNOSIS — I70219 Atherosclerosis of native arteries of extremities with intermittent claudication, unspecified extremity: Secondary | ICD-10-CM | POA: Diagnosis not present

## 2012-10-18 NOTE — Progress Notes (Signed)
Subjective:     Patient ID: Ryan [name], male   DOB: 05/24/1941, 71 y.o.   MRN: [id number]  HPI this 71 year old male is referred by Dr. Cleophas Dunker for possible lower extremity occlusive disease. I performed an aortobifemoral bypass graft on this gentleman 1987 for aortic occlusion. He had previously had 2 back operations by Dr. Vonda Antigua prior to that time. He also had a myocardial infarction 10 years earlier. His symptoms of claudication in the buttocks and thigh were completely relieved for many years. Recently he has developed recurrent hip and thigh symptoms. He had back surgery performed by Dr. Jeral Fruit in March of 2013 but has had no relief of his symptoms. He denies any history of nonhealing ulcers, infection, gangrene, or rest pain. Symptoms occur after ambulating about one half block and rapidly require him to stop. He has no symptoms in the calf or foot but all in the hip and thigh areas.  Past Medical History  Diagnosis Date  . Hypertension   . Arthritis   . Myocardial infarction     19    dr Clelia Croft PCP          . Hyperlipidemia   . Hyperthyroidism   . Peripheral vascular disease     History  Substance Use Topics  . Smoking status: Former Smoker    Types: Cigarettes    Quit date: 02/04/1991  . Smokeless tobacco: Current User    Types: Chew  . Alcohol Use: 0.6 oz/week    1 Cans of beer per week     occ    History reviewed. No pertinent family history.  Allergies  Allergen Reactions  . Codeine Nausea And Vomiting    REACTION: nausea, vomiting    Current outpatient prescriptions:aspirin EC 81 MG tablet, Take 81 mg by mouth daily., Disp: , Rfl: ;  BENICAR 40 MG tablet, Take 40 mg by mouth daily., Disp: , Rfl: ;  docusate sodium (COLACE) 100 MG capsule, Take 100 mg by mouth daily., Disp: , Rfl: ;  RAPAFLO 8 MG CAPS capsule, Take 8 mg by mouth daily., Disp: , Rfl: ;  colesevelam (WELCHOL) 625 MG tablet, Take 1,250 mg by mouth 2 (two) times daily with a meal., Disp: ,  Rfl:  levothyroxine (SYNTHROID, LEVOTHROID) 75 MCG tablet, Take 75 mcg by mouth daily., Disp: , Rfl: ;  nebivolol (BYSTOLIC) 10 MG tablet, Take 10 mg by mouth daily., Disp: , Rfl: ;  OVER THE COUNTER MEDICATION, Take 3 tablets by mouth once as needed. Over the counter laxative., Disp: , Rfl: ;  Tamsulosin HCl (FLOMAX) 0.4 MG CAPS, Take 0.4 mg by mouth daily., Disp: , Rfl:   BP 153/72  Pulse 67  Resp 16  Ht 5\' 7"  (1.702 m)  Wt 184 lb (83.462 kg)  BMI 28.82 kg/m2  SpO2 100%  Body mass index is 28.82 kg/(m^2).         Review of Systems denies chest pain, , PND, orthopnea, hemoptysis, lateralizing weakness, aphasia, amaurosis fugax, diplopia, blurred vision, syncope. All systems negative except for occasional skin rashes, and mild dyspnea on exertion    Objective:   Physical Exam blood pressure 153/72 heart rate 67 respirations 16 Gen.-alert and oriented x3 in no apparent distress HEENT normal for age Lungs no rhonchi or wheezing Cardiovascular regular rhythm no murmurs carotid pulses 3+ palpable-soft bruits bilaterally Abdomen soft nontender no palpable masses Musculoskeletal free of  major deformities Skin clear -no rashes Neurologic normal Lower extremities 3+ femoral and dorsalis pedis  pulses palpable bilaterally with no edema-both feet well perfused  Today I ordered a carotid duplex exam which revealed moderate carotid occlusive disease on the left in the 60-70% range with minimal right ICA stenosis. Also it ordered lower extremity arterial Doppler. It is essentially a normal study with ABIs 0.97 on the right and 0.98 on the left with triphasic flow in both feet.     Assessment:     No evidence of arterial insufficiency 26 years post aortobifemoral bypass graft-now with buttock and hip discomfort with ambulation Status post recent back surgery with no improvement Stable carotid occlusive disease with moderate left ICA stenosis-asymptomatic    Plan:     Do not see any  evidence to suggest that patient has arterial etiology for his buttock and hip symptoms. Will refer him back to Dr. Cleophas Dunker for further evaluation. Will continue to follow him on a regular basis for his arterial status and carotid occlusive disease

## 2012-10-19 ENCOUNTER — Encounter: Payer: Self-pay | Admitting: Vascular Surgery

## 2012-10-19 NOTE — Addendum Note (Signed)
Addended by: Melodye Ped C on: 10/19/2012 12:01 PM   Modules accepted: Orders

## 2012-10-24 DIAGNOSIS — M1610 Unilateral primary osteoarthritis, unspecified hip: Secondary | ICD-10-CM | POA: Diagnosis not present

## 2012-11-01 DIAGNOSIS — IMO0002 Reserved for concepts with insufficient information to code with codable children: Secondary | ICD-10-CM | POA: Diagnosis not present

## 2012-11-01 DIAGNOSIS — M48061 Spinal stenosis, lumbar region without neurogenic claudication: Secondary | ICD-10-CM | POA: Diagnosis not present

## 2012-11-03 DIAGNOSIS — I10 Essential (primary) hypertension: Secondary | ICD-10-CM | POA: Diagnosis not present

## 2012-11-04 ENCOUNTER — Other Ambulatory Visit: Payer: Self-pay | Admitting: Orthopaedic Surgery

## 2012-11-04 DIAGNOSIS — M48061 Spinal stenosis, lumbar region without neurogenic claudication: Secondary | ICD-10-CM

## 2012-11-11 ENCOUNTER — Ambulatory Visit
Admission: RE | Admit: 2012-11-11 | Discharge: 2012-11-11 | Disposition: A | Discharge status: Home / Self Care | Payer: Medicare Other | Source: Ambulatory Visit | Attending: Orthopaedic Surgery | Admitting: Orthopaedic Surgery

## 2012-11-11 DIAGNOSIS — M5126 Other intervertebral disc displacement, lumbar region: Secondary | ICD-10-CM | POA: Diagnosis not present

## 2012-11-11 DIAGNOSIS — M48061 Spinal stenosis, lumbar region without neurogenic claudication: Secondary | ICD-10-CM | POA: Diagnosis not present

## 2012-11-11 MED ORDER — GADOBENATE DIMEGLUMINE 529 MG/ML IV SOLN
17.0000 mL | Freq: Once | INTRAVENOUS | Status: AC | PRN
  Administered 2012-11-11: 17 mL via INTRAVENOUS

## 2012-11-22 DIAGNOSIS — M48061 Spinal stenosis, lumbar region without neurogenic claudication: Secondary | ICD-10-CM | POA: Diagnosis not present

## 2012-12-06 DIAGNOSIS — I10 Essential (primary) hypertension: Secondary | ICD-10-CM | POA: Diagnosis not present

## 2012-12-06 DIAGNOSIS — I2510 Atherosclerotic heart disease of native coronary artery without angina pectoris: Secondary | ICD-10-CM | POA: Diagnosis not present

## 2012-12-06 DIAGNOSIS — I739 Peripheral vascular disease, unspecified: Secondary | ICD-10-CM | POA: Diagnosis not present

## 2012-12-06 DIAGNOSIS — R7301 Impaired fasting glucose: Secondary | ICD-10-CM | POA: Diagnosis not present

## 2012-12-20 DIAGNOSIS — M48061 Spinal stenosis, lumbar region without neurogenic claudication: Secondary | ICD-10-CM | POA: Diagnosis not present

## 2012-12-22 ENCOUNTER — Other Ambulatory Visit: Payer: Self-pay | Admitting: Orthopaedic Surgery

## 2012-12-22 DIAGNOSIS — M4800 Spinal stenosis, site unspecified: Secondary | ICD-10-CM

## 2012-12-27 ENCOUNTER — Ambulatory Visit
Admission: RE | Admit: 2012-12-27 | Discharge: 2012-12-27 | Disposition: A | Discharge status: Home / Self Care | Payer: Medicare Other | Source: Ambulatory Visit | Attending: Orthopaedic Surgery | Admitting: Orthopaedic Surgery

## 2012-12-27 VITALS — BP 145/72 | HR 87

## 2012-12-27 DIAGNOSIS — M4800 Spinal stenosis, site unspecified: Secondary | ICD-10-CM

## 2012-12-27 DIAGNOSIS — M5126 Other intervertebral disc displacement, lumbar region: Secondary | ICD-10-CM | POA: Diagnosis not present

## 2012-12-27 DIAGNOSIS — M48061 Spinal stenosis, lumbar region without neurogenic claudication: Secondary | ICD-10-CM | POA: Diagnosis not present

## 2012-12-27 DIAGNOSIS — M545 Low back pain, unspecified: Secondary | ICD-10-CM | POA: Diagnosis not present

## 2012-12-27 MED ORDER — ONDANSETRON HCL 4 MG/2ML IJ SOLN
4.0000 mg | Freq: Once | INTRAMUSCULAR | Status: AC
  Administered 2012-12-27: 4 mg via INTRAMUSCULAR

## 2012-12-27 MED ORDER — MEPERIDINE HCL 100 MG/ML IJ SOLN
75.0000 mg | Freq: Once | INTRAMUSCULAR | Status: AC
  Administered 2012-12-27: 75 mg via INTRAMUSCULAR

## 2012-12-27 MED ORDER — DIAZEPAM 5 MG PO TABS
5.0000 mg | ORAL_TABLET | Freq: Once | ORAL | Status: AC
  Administered 2012-12-27: 5 mg via ORAL

## 2012-12-27 MED ORDER — IOHEXOL 180 MG/ML  SOLN
20.0000 mL | Freq: Once | INTRAMUSCULAR | Status: AC | PRN
  Administered 2012-12-27: 20 mL via INTRATHECAL

## 2013-01-02 ENCOUNTER — Telehealth: Payer: Self-pay | Admitting: Cardiovascular Disease

## 2013-01-02 ENCOUNTER — Encounter (HOSPITAL_COMMUNITY): Payer: Self-pay | Admitting: Respiratory Therapy

## 2013-01-02 ENCOUNTER — Other Ambulatory Visit (HOSPITAL_COMMUNITY): Payer: Self-pay | Admitting: Internal Medicine

## 2013-01-02 ENCOUNTER — Other Ambulatory Visit (HOSPITAL_COMMUNITY): Payer: Self-pay | Admitting: Orthopaedic Surgery

## 2013-01-02 ENCOUNTER — Telehealth: Payer: Self-pay | Admitting: *Deleted

## 2013-01-02 DIAGNOSIS — M48061 Spinal stenosis, lumbar region without neurogenic claudication: Secondary | ICD-10-CM | POA: Diagnosis not present

## 2013-01-02 NOTE — Telephone Encounter (Signed)
Made app for 01/05/13, with dr Elease Hashimoto, pt has not been seen for 3-4 years, HX MI, with length of time from last visit cardiac clearance must be with Dr not PA/NP, Surgeon request being seen today or he will need to admit him and get a cardiac clearance. Last app 03/06/2009, last stress test 10/01/2004, consulted dr Elease Hashimoto, pt will need to be admitted, Roanna Raider was informed, appointment cancelled.

## 2013-01-02 NOTE — Telephone Encounter (Signed)
msg left, no app today, dr in hospital this afternoon and tomorrow, aske her to call back to see if we can make accomodations, pt not seen since 2010, will be reestablish app.

## 2013-01-02 NOTE — Telephone Encounter (Signed)
See note prior from today. Rescheduled app 01/05/13 2pm dbl booked app.

## 2013-01-02 NOTE — Telephone Encounter (Signed)
App made 

## 2013-01-02 NOTE — Telephone Encounter (Signed)
Pt having lumbar decompression done on Wed and needs to be seen today

## 2013-01-03 ENCOUNTER — Encounter (HOSPITAL_COMMUNITY): Payer: Self-pay

## 2013-01-03 ENCOUNTER — Encounter (HOSPITAL_COMMUNITY)
Admission: RE | Admit: 2013-01-03 | Discharge: 2013-01-03 | Disposition: A | Discharge status: Home / Self Care | Payer: Medicare Other | Source: Ambulatory Visit | Attending: Orthopaedic Surgery | Admitting: Orthopaedic Surgery

## 2013-01-03 DIAGNOSIS — E0590 Thyrotoxicosis, unspecified without thyrotoxic crisis or storm: Secondary | ICD-10-CM | POA: Diagnosis not present

## 2013-01-03 DIAGNOSIS — I10 Essential (primary) hypertension: Secondary | ICD-10-CM | POA: Diagnosis not present

## 2013-01-03 DIAGNOSIS — Z96619 Presence of unspecified artificial shoulder joint: Secondary | ICD-10-CM | POA: Diagnosis not present

## 2013-01-03 DIAGNOSIS — G988 Other disorders of nervous system: Secondary | ICD-10-CM | POA: Diagnosis not present

## 2013-01-03 DIAGNOSIS — Q762 Congenital spondylolisthesis: Secondary | ICD-10-CM | POA: Diagnosis not present

## 2013-01-03 DIAGNOSIS — I252 Old myocardial infarction: Secondary | ICD-10-CM | POA: Diagnosis not present

## 2013-01-03 DIAGNOSIS — I2510 Atherosclerotic heart disease of native coronary artery without angina pectoris: Secondary | ICD-10-CM | POA: Diagnosis not present

## 2013-01-03 DIAGNOSIS — M48062 Spinal stenosis, lumbar region with neurogenic claudication: Secondary | ICD-10-CM | POA: Diagnosis not present

## 2013-01-03 DIAGNOSIS — I739 Peripheral vascular disease, unspecified: Secondary | ICD-10-CM | POA: Diagnosis not present

## 2013-01-03 HISTORY — DX: Other chronic pain: G89.29

## 2013-01-03 HISTORY — DX: Personal history of urinary calculi: Z87.442

## 2013-01-03 HISTORY — DX: Urgency of urination: R39.15

## 2013-01-03 HISTORY — DX: Personal history of other diseases of the musculoskeletal system and connective tissue: Z87.39

## 2013-01-03 HISTORY — DX: Frequency of micturition: R35.0

## 2013-01-03 HISTORY — DX: Personal history of colonic polyps: Z86.010

## 2013-01-03 HISTORY — DX: Personal history of colon polyps, unspecified: Z86.0100

## 2013-01-03 HISTORY — DX: Benign prostatic hyperplasia without lower urinary tract symptoms: N40.0

## 2013-01-03 HISTORY — DX: Dorsalgia, unspecified: M54.9

## 2013-01-03 LAB — CBC
HCT: 44 % (ref 39.0–52.0)
Hemoglobin: 15.5 g/dL (ref 13.0–17.0)
MCH: 32.4 pg (ref 26.0–34.0)
MCHC: 35.2 g/dL (ref 30.0–36.0)
MCV: 92.1 fL (ref 78.0–100.0)
Platelets: 311 10*3/uL (ref 150–400)
RBC: 4.78 MIL/uL (ref 4.22–5.81)
RDW: 12.5 % (ref 11.5–15.5)
WBC: 10.4 10*3/uL (ref 4.0–10.5)

## 2013-01-03 LAB — BASIC METABOLIC PANEL
BUN: 16 mg/dL (ref 6–23)
CO2: 27 mEq/L (ref 19–32)
Calcium: 9.7 mg/dL (ref 8.4–10.5)
Chloride: 103 mEq/L (ref 96–112)
Creatinine, Ser: 1.05 mg/dL (ref 0.50–1.35)
GFR calc Af Amer: 80 mL/min — ABNORMAL LOW (ref >90)
GFR calc non Af Amer: 69 mL/min — ABNORMAL LOW (ref >90)
Glucose, Bld: 94 mg/dL (ref 70–99)
Potassium: 4.6 mEq/L (ref 3.5–5.1)
Sodium: 141 mEq/L (ref 135–145)

## 2013-01-03 LAB — SURGICAL PCR SCREEN
MRSA, PCR: NEGATIVE
Staphylococcus aureus: NEGATIVE

## 2013-01-03 NOTE — Progress Notes (Signed)
Pt seeing Dr.Nasher at 1:45 PM on Thurs, Jan 16 for cardiac clearance

## 2013-01-03 NOTE — Pre-Procedure Instructions (Signed)
Ryan [name]  01/03/2013   Your procedure is scheduled on:  Fri, Jan 17 @ 10:28 AM  Report to [facility] at 8:30 AM.  Call this number if you have problems the morning of surgery: [phone]   Remember:   Do not eat food or drink liquids after midnight.   Take these medicines the morning of surgery with A SIP OF WATER: Amlodipine(Norvasc),Gabapentin(Neurontin),and Levothyroxine(Synthroid)   Do not wear jewelry  Do not wear lotions, powders, or colognes. You may wear deodorant.  Men may shave face and neck.  Do not bring valuables to the hospital.  Contacts, dentures or bridgework may not be worn into surgery.  Leave suitcase in the car. After surgery it may be brought to your room.  For patients admitted to the hospital, checkout time is 11:00 AM the day of  discharge.   Patients discharged the day of surgery will not be allowed to drive  home.    Special Instructions: Shower using CHG 2 nights before surgery and the night before surgery.  If you shower the day of surgery use CHG.  Use special wash - you have one bottle of CHG for all showers.  You should use approximately 1/3 of the bottle for each shower.   Please read over the following fact sheets that you were given: Pain Booklet, Coughing and Deep Breathing, MRSA Information and Surgical Site Infection Prevention

## 2013-01-03 NOTE — Progress Notes (Signed)
Cardiologist was Dr.Nasher-doesn't have to see him anymore  Stress test in epic from 2005 Heart cath in epic from 2010 Denies echo ever being done  Dr.Douglas Clelia Croft on [facility] in [place]  EKG and Stress in epic from 02/05/12

## 2013-01-04 NOTE — H&P (Signed)
[facility]   A Division of [employer] [facility]   [address], [place], [place] [address] Telephone: [phone]  Fax: [phone]     PATIENT: Ryan [name], Ryan [name]   MR#: [id number]  DOB: [birth year]   Visit Date: 01/02/2013     CHIEF COMPLAINT:  Low back pain with right greater than left leg pain and inability to ambulate more than 20 feet with claudication.   HISTORY OF PRESENT ILLNESS:  This is a 72 year old male who has been followed and had previous surgery by Dr. Roxan Hockey initially and then later surgery by Dr. Jeral Fruit for decompression at the L4-5 level greater than 10 years ago.  He also had previous abdominal aneurism surgery with bypass graft 26 years ago and has been checked by vascular surgeons with ABIs with no evidence of significant arterial claudication.  Arterial bypass in 1992.  Previous back surgery in 1989.  He had a procedure done by Dr. Jeral Fruit in February 2013 but has asked to have his care transferred.  He has seen Dr. Cleophas Dunker for shoulder surgery in the past.  The patient has also had shoulder replacement in 1992.  He had a myelogram CT scan performed on 12/27/2012, and states in the last 6 days since then his legs have actually been weaker, worse on the right than the left and previously was having more left leg pain than right.  MRI scan on 11/14/2012 showed L3-4 broad-based disc protrusion causing moderate stenosis.  He also had some clumping of the thecal sac consistent with chronic arachnoiditis.  Previously his stenosis the L4-5 level in February was severe.    SOCIAL HISTORY:  The patient is married to his wife, Earley Abide.  The patient is retired but did smoke 1 pack per day for 30 years.  He does not drink.   FAMILY HISTORY:  Negative for diabetes, heart disease, lung disease or hypertension.   REVIEW OF SYSTEMS:  Positive for acid reflux, shoulder arthritis, bladder problems, cataracts, heart disease and hypertension.  He denies any  associated bowel or bladder symptoms with his lower extremity weakness.     PHYSICAL EXAMINATION:  The patient is alert and oriented.  WDWN with moderate pain.  He states he has not slept in 3 nights.  He can only walk 20 feet and then has to stop and sit.  Pain is present even when he is lying down.  He denies any associated headache.  Height 5 feet 7 inches.  Weight 185.  Extraocular movements are intact.  He does have some scar tissue on his right eye.  Lungs are clear.  Heart has occasional PACs, 6 noted in 1 minute with pulse palpation.  No gallop.  Abdomen is soft and nontender.   RADIOGRAPHS/TEST:   Myelogram CT scan is reviewed with the patient.  He had a needle placed at the L4 pedicle level on the right paracentral and states he has had pain since the injection.  There is 2 to 3 mm of retrolisthesis on myelogram, which appears stable and is not shifting.  Minimal bulge at L2-3.  There is broad-based left paracentral and foraminal protrusion, partially calcified at L3-4 with clumping of the nerve roots.  No dynamic instability at L3-4.  There has been interval decompression at L4-5 with relief of his severe compression.   ASSESSMENT/DIAGNOSIS:  Status post decompression at L4-5 and L5-S1 with moderate stenosis at L3-4.  He has gotten worse since the epidural and may have a little bit of epidural  hematoma causing some further compression.  He states he cannot sleep, cannot stand.  Vicodin is not helping.  He is taking enough where he has been throwing up.  He states "either schedule me for surgery or put me in the hospital."  We discussed options with him.   PLAN:  Plan will be a L3-4 decompression.  With his recent myelogram if there is any evidence of CSF leak, then this would be addressed at the time.  The plan would be a central decompression at L3-4 level, prone position. Use the operative microscope.  We discussed scar tissue to have from previous surgery, potential epidural tear repair,  potential for leaking from his myelogram, epidural hematoma, which would be evacuated.  With his occasional PAC, will have him seen by cardiology. He states he had arteriogram with cardiac cath done last year and states he did not have any areas of "significant stenosis; looked good."  Will have him seen by the cardiologist and then we can proceed at his request.   For additional information please see handwritten notes, reports, orders and prescriptions in this chart.      Jennyfer Nickolson C. Ophelia Charter, M.D.    Auto-Authenticated by Veverly Fells. Ophelia Charter, M.D.  MCY/lb DD: 01/02/2013  DT: 01/02/2013   cc: [facility](Emdat [employer]ofax)   Norlene Campbell, MD(Emdat Autofax)

## 2013-01-05 ENCOUNTER — Encounter: Payer: Self-pay | Admitting: Cardiovascular Disease

## 2013-01-05 ENCOUNTER — Encounter: Payer: Self-pay | Admitting: *Deleted

## 2013-01-05 ENCOUNTER — Ambulatory Visit: Payer: Medicare Other | Admitting: Cardiovascular Disease

## 2013-01-05 ENCOUNTER — Ambulatory Visit (INDEPENDENT_AMBULATORY_CARE_PROVIDER_SITE_OTHER): Payer: Medicare Other | Admitting: Cardiovascular Disease

## 2013-01-05 VITALS — BP 150/70 | HR 84 | Ht 67.0 in | Wt 188.1 lb

## 2013-01-05 DIAGNOSIS — I2510 Atherosclerotic heart disease of native coronary artery without angina pectoris: Secondary | ICD-10-CM | POA: Diagnosis not present

## 2013-01-05 MED ORDER — METOPROLOL TARTRATE 25 MG PO TABS: 25.0000 mg | ORAL_TABLET | Freq: Two times a day (BID) | ORAL | Status: DC

## 2013-01-05 MED ORDER — CEFAZOLIN SODIUM-DEXTROSE 2-3 GM-% IV SOLR
2.0000 g | INTRAVENOUS | Status: AC
  Administered 2013-01-06: 2 g via INTRAVENOUS
  Filled 2013-01-05: qty 50

## 2013-01-05 NOTE — Progress Notes (Signed)
Ryan [name] Date of Birth  [birth year]       [facility]    [place] [address]  [address] [place], [place]  [address]   [place], [place]  [address] [phone]     [phone]   Fax  [phone]    Fax [phone]  Problem List: 1. Coronary artery disease -  Cath 2010 - he has moderate irregularities in the LAD and circumflex. The first diagonal has a tight stenosis. His right coronary artery is chronically occluded with intact filling from the left system. 2. Hypertension 3. Hyperlipidemia   History of Present Illness:  I last saw him approximately 3-4 years ago. We performed a heart catheterization which revealed complete occlusion of his proximal right coronary artery with collateral filling from the left system. He has moderate diffuse right leg is normal LAD and circumflex. He has  done well on medical therapy. He denies any chest pain or problems.  Up to  this week he was able to climb 2 flights of stairs without any difficulty.  He was walking 4 miles a day prior to his back problem.   He denies any episodes of chest pain or shortness of breath.  His main problem now is that of back pain. He has had 4 surgeries on his back so far and is scheduled have a repeat back surgery soon.  He's not able to exercise.  He's been sleeping in a chair.  He is in fairly excruciating pain. I discussed the case with Dr. Ophelia Charter.   Current Outpatient Prescriptions on File Prior to Visit  Medication Sig Dispense Refill  . amLODipine (NORVASC) 5 MG tablet Take 5 mg by mouth daily.      Marland Kitchen aspirin EC 81 MG tablet Take 81 mg by mouth daily.      Marland Kitchen gabapentin (NEURONTIN) 100 MG capsule Take 100-300 mg by mouth 3 (three) times daily. Take 1 cap in the morning and afternoon. Take 3 caps in the evening      . levothyroxine (SYNTHROID, LEVOTHROID) 75 MCG tablet Take 75 mcg by mouth daily.      . promethazine (PHENERGAN) 25 MG tablet Take 25 mg by  mouth every 6 (six) hours as needed.      . rosuvastatin (CRESTOR) 10 MG tablet Take 10 mg by mouth daily.      . silodosin (RAPAFLO) 8 MG CAPS capsule Take 8 mg by mouth daily with breakfast.        Allergies  Allergen Reactions  . Codeine Nausea And Vomiting         Past Medical History  Diagnosis Date  . Arthritis   . Myocardial infarction     40    dr Clelia Croft PCP          . Hyperlipidemia     takes Crestor daily  . Peripheral vascular disease   . Carotid artery occlusion   . Hypertension     takes Amlodipine daily  . Chronic back pain   . History of gout     has colchicine prn  . History of colon polyps   . Urinary frequency   . Urinary urgency   . Enlarged prostate     takes Rapaflo daily  . History of kidney stones   . Hyperthyroidism     takes SYnthroid daily as instructed    Past Surgical History  Procedure Date  . Femoral artery - popliteal artery bypass  graft   . Joint replacement     shoulder  . Back surgery     lumber   x  3  . Lumbar laminectomy/decompression microdiscectomy 02/12/2012    Procedure: LUMBAR LAMINECTOMY/DECOMPRESSION MICRODISCECTOMY;  Surgeon: [name], MD;  Location: [facility];  Service: Neurosurgery;  Laterality: N/A;  Lumbar four-five laminectomy  . Appendectomy   . Cataract surgery   . Colonoscopy   . Cardiac catheterization     2010    dr Elease Hashimoto  . Big toe surgery   . Cystoscopy     History  Smoking status  . Former Smoker  . Types: Cigarettes  . Quit date: 02/04/1991  Smokeless tobacco  . Current User  . Types: Chew    Comment: quit 35+yrs ago    History  Alcohol Use No    No family history on file.  Reviw of Systems:  Reviewed in the HPI.  All other systems are negative.  Physical Exam: Blood pressure 150/70, pulse 84, height 5\' 7"  (1.702 m), weight 188 lb 1.9 oz (85.331 kg). General: Well developed, well nourished, in no acute distress.  Head: Normocephalic, atraumatic, sclera non-icteric, mucus  membranes are moist,   Neck: Supple. Carotids are 2 + without bruits. No JVD   Lungs: Clear   Heart: RR, normal S1, S2  Abdomen: Soft, non-tender, non-distended with normal bowel sounds.  Msk:  Strength and tone are normal   Extremities: No clubbing or cyanosis. No edema.  Distal pedal pulses are 2+ and equal    Neuro: CN II - XII intact.  Alert and oriented X 3.   Psych:  Normal   ECG: 01/05/2013: Normal sinus rhythm. He has occasional PACs. He has nonspecific ST and T wave changes in the lateral leads.  Assessment / Plan:

## 2013-01-05 NOTE — Patient Instructions (Addendum)
Your physician has recommended you make the following change in your medication:  START METOPROLOL 25 MG TABLET TWICE DAILY 12 HOURS APART  Your physician recommends that you schedule a follow-up appointment in: 1 MONTH  YOU ARE CLEARED FOR YOUR URGENT BACK SURGERY SCHEDULED FOR TOMORROW AT LOW TO MODERATE RISK. WE WILL SEND A NOTE TO YOUR SURGEON TODAY.

## 2013-01-05 NOTE — Assessment & Plan Note (Signed)
Ryan [name] has not been seen by me in 3-4  years. He has a history of coronary artery disease by cath in 2010. He has an occluded right coronary artery with good collateral filling from the left system. He has moderate disease of his LAD and left circumflex artery.  Before his recent back injury Ryan [name] was in good shape. He was able to calm stairs without any significant problems. He would typically walks between 2 and 4 miles every day without angina, syncope, presyncope, or chest pain.  He needs to have surgery fairly quickly. He is in excruciating pain and cannot walk without great assistance. He is uncomfortable sitting. He has not been able to sleep in the bed for the past week. I feel that the need for urgent surgery is needed.  I think that he is at moderate risk for his upcoming surgery. I discussed the case with Dr. Kevan Ny. He will not lose much blood. The anesthesia will not be very prolonged.  We'll start him on metoprolol 25 mg twice a day. This will help control his blood pressure and his heart rate and should help protect him during surgery. We will be available to see him if needed during his hospitalization.

## 2013-01-06 ENCOUNTER — Encounter (HOSPITAL_COMMUNITY): Payer: Self-pay | Admitting: Anesthesiology

## 2013-01-06 ENCOUNTER — Inpatient Hospital Stay (HOSPITAL_COMMUNITY): Payer: Medicare Other

## 2013-01-06 ENCOUNTER — Encounter (HOSPITAL_COMMUNITY)
Admission: RE | Disposition: A | Discharge status: Home / Self Care | Payer: Self-pay | Source: Ambulatory Visit | Attending: Orthopaedic Surgery

## 2013-01-06 ENCOUNTER — Encounter (HOSPITAL_COMMUNITY): Payer: Self-pay | Admitting: *Deleted

## 2013-01-06 ENCOUNTER — Observation Stay (HOSPITAL_COMMUNITY)
Admission: RE | Admit: 2013-01-06 | Discharge: 2013-01-07 | Disposition: A | Discharge status: Home / Self Care | Payer: Medicare Other | Source: Ambulatory Visit | Attending: Orthopaedic Surgery | Admitting: Orthopaedic Surgery

## 2013-01-06 ENCOUNTER — Inpatient Hospital Stay (HOSPITAL_COMMUNITY): Payer: Medicare Other | Admitting: Anesthesiology

## 2013-01-06 DIAGNOSIS — G988 Other disorders of nervous system: Secondary | ICD-10-CM | POA: Insufficient documentation

## 2013-01-06 DIAGNOSIS — E0590 Thyrotoxicosis, unspecified without thyrotoxic crisis or storm: Secondary | ICD-10-CM | POA: Diagnosis not present

## 2013-01-06 DIAGNOSIS — I739 Peripheral vascular disease, unspecified: Secondary | ICD-10-CM | POA: Insufficient documentation

## 2013-01-06 DIAGNOSIS — I2510 Atherosclerotic heart disease of native coronary artery without angina pectoris: Secondary | ICD-10-CM | POA: Diagnosis not present

## 2013-01-06 DIAGNOSIS — I252 Old myocardial infarction: Secondary | ICD-10-CM | POA: Insufficient documentation

## 2013-01-06 DIAGNOSIS — M549 Dorsalgia, unspecified: Secondary | ICD-10-CM | POA: Diagnosis not present

## 2013-01-06 DIAGNOSIS — M48061 Spinal stenosis, lumbar region without neurogenic claudication: Secondary | ICD-10-CM | POA: Diagnosis not present

## 2013-01-06 DIAGNOSIS — Z96619 Presence of unspecified artificial shoulder joint: Secondary | ICD-10-CM | POA: Insufficient documentation

## 2013-01-06 DIAGNOSIS — Y849 Medical procedure, unspecified as the cause of abnormal reaction of the patient, or of later complication, without mention of misadventure at the time of the procedure: Secondary | ICD-10-CM | POA: Insufficient documentation

## 2013-01-06 DIAGNOSIS — Q762 Congenital spondylolisthesis: Secondary | ICD-10-CM | POA: Insufficient documentation

## 2013-01-06 DIAGNOSIS — M48062 Spinal stenosis, lumbar region with neurogenic claudication: Secondary | ICD-10-CM | POA: Diagnosis not present

## 2013-01-06 DIAGNOSIS — M519 Unspecified thoracic, thoracolumbar and lumbosacral intervertebral disc disorder: Secondary | ICD-10-CM | POA: Diagnosis not present

## 2013-01-06 DIAGNOSIS — I10 Essential (primary) hypertension: Secondary | ICD-10-CM | POA: Insufficient documentation

## 2013-01-06 HISTORY — PX: LUMBAR LAMINECTOMY: SHX95

## 2013-01-06 LAB — URINALYSIS, ROUTINE W REFLEX MICROSCOPIC
Glucose, UA: NEGATIVE mg/dL
Hgb urine dipstick: NEGATIVE
Leukocytes, UA: NEGATIVE
Protein, ur: NEGATIVE mg/dL
Specific Gravity, Urine: 1.023 (ref 1.005–1.030)
pH: 5 (ref 5.0–8.0)

## 2013-01-06 LAB — HEPATIC FUNCTION PANEL
AST: 24 U/L (ref 0–37)
Albumin: 3.9 g/dL (ref 3.5–5.2)
Total Bilirubin: 0.5 mg/dL (ref 0.3–1.2)
Total Protein: 7.6 g/dL (ref 6.0–8.3)

## 2013-01-06 SURGERY — MICRODISCECTOMY LUMBAR LAMINECTOMY
Anesthesia: General | Site: Back | Wound class: Clean

## 2013-01-06 MED ORDER — CEFAZOLIN SODIUM 1-5 GM-% IV SOLN
1.0000 g | Freq: Three times a day (TID) | INTRAVENOUS | Status: AC
  Administered 2013-01-06 – 2013-01-07 (×2): 1 g via INTRAVENOUS
  Filled 2013-01-06 (×2): qty 50

## 2013-01-06 MED ORDER — MENTHOL 3 MG MT LOZG: 1.0000 | LOZENGE | OROMUCOSAL | Status: DC | PRN

## 2013-01-06 MED ORDER — DOCUSATE SODIUM 100 MG PO CAPS
100.0000 mg | ORAL_CAPSULE | Freq: Two times a day (BID) | ORAL | Status: DC
  Administered 2013-01-06 – 2013-01-07 (×2): 100 mg via ORAL
  Filled 2013-01-06 (×3): qty 1

## 2013-01-06 MED ORDER — LIDOCAINE HCL (CARDIAC) 20 MG/ML IV SOLN
INTRAVENOUS | Status: DC | PRN
  Administered 2013-01-06: 100 mg via INTRAVENOUS

## 2013-01-06 MED ORDER — PROMETHAZINE HCL 25 MG PO TABS: 25.0000 mg | ORAL_TABLET | Freq: Four times a day (QID) | ORAL | Status: DC | PRN

## 2013-01-06 MED ORDER — LEVOTHYROXINE SODIUM 75 MCG PO TABS
75.0000 ug | ORAL_TABLET | Freq: Every day | ORAL | Status: DC
  Administered 2013-01-07: 75 ug via ORAL
  Filled 2013-01-06 (×2): qty 1

## 2013-01-06 MED ORDER — GABAPENTIN 100 MG PO CAPS: 100.0000 mg | ORAL_CAPSULE | Freq: Three times a day (TID) | ORAL | Status: DC

## 2013-01-06 MED ORDER — OXYCODONE-ACETAMINOPHEN 5-325 MG PO TABS: 1.0000 | ORAL_TABLET | ORAL | Status: DC | PRN

## 2013-01-06 MED ORDER — NEOSTIGMINE METHYLSULFATE 1 MG/ML IJ SOLN
INTRAMUSCULAR | Status: DC | PRN
  Administered 2013-01-06: 0.5 mg via INTRAVENOUS

## 2013-01-06 MED ORDER — BISACODYL 10 MG RE SUPP
10.0000 mg | Freq: Every day | RECTAL | Status: DC | PRN
  Filled 2013-01-06: qty 1

## 2013-01-06 MED ORDER — PHENYLEPHRINE HCL 10 MG/ML IJ SOLN
INTRAMUSCULAR | Status: DC | PRN
  Administered 2013-01-06: 120 ug via INTRAVENOUS

## 2013-01-06 MED ORDER — BUPIVACAINE HCL (PF) 0.25 % IJ SOLN
INTRAMUSCULAR | Status: DC | PRN
  Administered 2013-01-06: 7 mL

## 2013-01-06 MED ORDER — PROPOFOL 10 MG/ML IV BOLUS
INTRAVENOUS | Status: DC | PRN
  Administered 2013-01-06: 160 mg via INTRAVENOUS

## 2013-01-06 MED ORDER — METOPROLOL TARTRATE 25 MG PO TABS
25.0000 mg | ORAL_TABLET | Freq: Once | ORAL | Status: AC
  Administered 2013-01-06: 25 mg via ORAL
  Filled 2013-01-06: qty 1

## 2013-01-06 MED ORDER — OXYCODONE-ACETAMINOPHEN 5-325 MG PO TABS
1.0000 | ORAL_TABLET | ORAL | Status: DC | PRN
  Administered 2013-01-06 – 2013-01-07 (×2): 2 via ORAL
  Filled 2013-01-06 (×2): qty 2

## 2013-01-06 MED ORDER — LACTATED RINGERS IV SOLN
INTRAVENOUS | Status: DC
  Administered 2013-01-06: 10:00:00 via INTRAVENOUS

## 2013-01-06 MED ORDER — 0.9 % SODIUM CHLORIDE (POUR BTL) OPTIME
TOPICAL | Status: DC | PRN
  Administered 2013-01-06: 1000 mL

## 2013-01-06 MED ORDER — GABAPENTIN 100 MG PO CAPS
100.0000 mg | ORAL_CAPSULE | Freq: Two times a day (BID) | ORAL | Status: DC
  Administered 2013-01-06 – 2013-01-07 (×2): 100 mg via ORAL
  Filled 2013-01-06 (×3): qty 1

## 2013-01-06 MED ORDER — EPHEDRINE SULFATE 50 MG/ML IJ SOLN
INTRAMUSCULAR | Status: DC | PRN
  Administered 2013-01-06 (×2): 5 mg via INTRAVENOUS

## 2013-01-06 MED ORDER — ONDANSETRON HCL 4 MG/2ML IJ SOLN: 4.0000 mg | Freq: Once | INTRAMUSCULAR | Status: DC | PRN

## 2013-01-06 MED ORDER — METOPROLOL TARTRATE 25 MG PO TABS
25.0000 mg | ORAL_TABLET | Freq: Two times a day (BID) | ORAL | Status: DC
  Administered 2013-01-06 – 2013-01-07 (×2): 25 mg via ORAL
  Filled 2013-01-06 (×4): qty 1

## 2013-01-06 MED ORDER — HYDROCODONE-ACETAMINOPHEN 5-325 MG PO TABS
1.0000 | ORAL_TABLET | ORAL | Status: DC | PRN
  Administered 2013-01-07: 2 via ORAL
  Filled 2013-01-06: qty 2

## 2013-01-06 MED ORDER — ACETAMINOPHEN 650 MG RE SUPP: 650.0000 mg | RECTAL | Status: DC | PRN

## 2013-01-06 MED ORDER — LACTATED RINGERS IV SOLN
INTRAVENOUS | Status: DC | PRN
  Administered 2013-01-06 (×2): via INTRAVENOUS

## 2013-01-06 MED ORDER — ROCURONIUM BROMIDE 100 MG/10ML IV SOLN
INTRAVENOUS | Status: DC | PRN
  Administered 2013-01-06: 50 mg via INTRAVENOUS

## 2013-01-06 MED ORDER — SENNOSIDES-DOCUSATE SODIUM 8.6-50 MG PO TABS: 1.0000 | ORAL_TABLET | Freq: Every evening | ORAL | Status: DC | PRN

## 2013-01-06 MED ORDER — ATORVASTATIN CALCIUM 20 MG PO TABS
20.0000 mg | ORAL_TABLET | Freq: Every day | ORAL | Status: DC
  Administered 2013-01-06: 20 mg via ORAL
  Filled 2013-01-06 (×2): qty 1

## 2013-01-06 MED ORDER — MORPHINE SULFATE 2 MG/ML IJ SOLN
1.0000 mg | INTRAMUSCULAR | Status: DC | PRN
  Administered 2013-01-06 (×2): 4 mg via INTRAVENOUS
  Filled 2013-01-06 (×2): qty 2

## 2013-01-06 MED ORDER — HYDROMORPHONE HCL PF 1 MG/ML IJ SOLN
INTRAMUSCULAR | Status: AC
  Filled 2013-01-06: qty 2

## 2013-01-06 MED ORDER — ACETAMINOPHEN 325 MG PO TABS: 650.0000 mg | ORAL_TABLET | ORAL | Status: DC | PRN

## 2013-01-06 MED ORDER — PHENOL 1.4 % MT LIQD: 1.0000 | OROMUCOSAL | Status: DC | PRN

## 2013-01-06 MED ORDER — METHOCARBAMOL 500 MG PO TABS
500.0000 mg | ORAL_TABLET | Freq: Four times a day (QID) | ORAL | Status: DC | PRN
  Administered 2013-01-06: 500 mg via ORAL
  Filled 2013-01-06: qty 1

## 2013-01-06 MED ORDER — ASPIRIN EC 81 MG PO TBEC
81.0000 mg | DELAYED_RELEASE_TABLET | Freq: Every day | ORAL | Status: DC
  Administered 2013-01-07: 81 mg via ORAL
  Filled 2013-01-06: qty 1

## 2013-01-06 MED ORDER — SUFENTANIL CITRATE 50 MCG/ML IV SOLN
INTRAVENOUS | Status: DC | PRN
  Administered 2013-01-06 (×3): 10 ug via INTRAVENOUS

## 2013-01-06 MED ORDER — KCL IN DEXTROSE-NACL 20-5-0.45 MEQ/L-%-% IV SOLN
INTRAVENOUS | Status: DC
  Administered 2013-01-06 – 2013-01-07 (×2): via INTRAVENOUS
  Filled 2013-01-06 (×3): qty 1000

## 2013-01-06 MED ORDER — ALBUMIN HUMAN 5 % IV SOLN
INTRAVENOUS | Status: DC | PRN
  Administered 2013-01-06: 11:00:00 via INTRAVENOUS

## 2013-01-06 MED ORDER — ONDANSETRON HCL 4 MG/2ML IJ SOLN: 4.0000 mg | INTRAMUSCULAR | Status: DC | PRN

## 2013-01-06 MED ORDER — FLEET ENEMA 7-19 GM/118ML RE ENEM: 1.0000 | ENEMA | Freq: Once | RECTAL | Status: AC | PRN

## 2013-01-06 MED ORDER — HYDROMORPHONE HCL PF 1 MG/ML IJ SOLN
0.2500 mg | INTRAMUSCULAR | Status: DC | PRN
  Administered 2013-01-06 (×2): 0.5 mg via INTRAVENOUS

## 2013-01-06 MED ORDER — TAMSULOSIN HCL 0.4 MG PO CAPS
0.4000 mg | ORAL_CAPSULE | Freq: Every day | ORAL | Status: DC
  Administered 2013-01-07: 0.4 mg via ORAL
  Filled 2013-01-06: qty 1

## 2013-01-06 MED ORDER — ONDANSETRON HCL 4 MG/2ML IJ SOLN
INTRAMUSCULAR | Status: DC | PRN
  Administered 2013-01-06: 4 mg via INTRAVENOUS

## 2013-01-06 MED ORDER — SODIUM CHLORIDE 0.9 % IJ SOLN: 3.0000 mL | INTRAMUSCULAR | Status: DC | PRN

## 2013-01-06 MED ORDER — SODIUM CHLORIDE 0.9 % IV SOLN: 250.0000 mL | INTRAVENOUS | Status: DC

## 2013-01-06 MED ORDER — PHENYLEPHRINE HCL 10 MG/ML IJ SOLN
10.0000 mg | INTRAVENOUS | Status: DC | PRN
  Administered 2013-01-06: 40 ug/min via INTRAVENOUS

## 2013-01-06 MED ORDER — GLYCOPYRROLATE 0.2 MG/ML IJ SOLN
INTRAMUSCULAR | Status: DC | PRN
  Administered 2013-01-06: 0.6 mg via INTRAVENOUS

## 2013-01-06 MED ORDER — METHOCARBAMOL 100 MG/ML IJ SOLN
500.0000 mg | Freq: Four times a day (QID) | INTRAVENOUS | Status: DC | PRN
  Filled 2013-01-06: qty 5

## 2013-01-06 MED ORDER — GABAPENTIN 300 MG PO CAPS
300.0000 mg | ORAL_CAPSULE | Freq: Every day | ORAL | Status: DC
  Administered 2013-01-06: 300 mg via ORAL
  Filled 2013-01-06 (×2): qty 1

## 2013-01-06 MED ORDER — AMLODIPINE BESYLATE 5 MG PO TABS
5.0000 mg | ORAL_TABLET | Freq: Every day | ORAL | Status: DC
  Filled 2013-01-06: qty 1

## 2013-01-06 MED ORDER — LIDOCAINE HCL 4 % MT SOLN
OROMUCOSAL | Status: DC | PRN
  Administered 2013-01-06: 4 mL via TOPICAL

## 2013-01-06 MED ORDER — BUPIVACAINE HCL (PF) 0.25 % IJ SOLN
INTRAMUSCULAR | Status: AC
  Filled 2013-01-06: qty 30

## 2013-01-06 MED ORDER — SODIUM CHLORIDE 0.9 % IJ SOLN
3.0000 mL | Freq: Two times a day (BID) | INTRAMUSCULAR | Status: DC
  Administered 2013-01-06: 3 mL via INTRAVENOUS

## 2013-01-06 SURGICAL SUPPLY — 57 items
ADH SKN CLS APL DERMABOND .7 (GAUZE/BANDAGES/DRESSINGS) ×1
APL SKNCLS STERI-STRIP NONHPOA (GAUZE/BANDAGES/DRESSINGS)
BENZOIN TINCTURE PRP APPL 2/3 (GAUZE/BANDAGES/DRESSINGS) ×1 IMPLANT
BUR ROUND FLUTED 4 SOFT TCH (BURR) IMPLANT
CANISTER SUCTION 2500CC (MISCELLANEOUS) ×1 IMPLANT
CLOTH BEACON ORANGE TIMEOUT ST (SAFETY) ×2 IMPLANT
CORDS BIPOLAR (ELECTRODE) ×2 IMPLANT
COVER MAYO STAND STRL (DRAPES) ×1 IMPLANT
COVER SURGICAL LIGHT HANDLE (MISCELLANEOUS) ×3 IMPLANT
DERMABOND ADVANCED (GAUZE/BANDAGES/DRESSINGS) ×1
DERMABOND ADVANCED .7 DNX12 (GAUZE/BANDAGES/DRESSINGS) IMPLANT
DRAPE MICROSCOPE LEICA (MISCELLANEOUS) ×2 IMPLANT
DRAPE PROXIMA HALF (DRAPES) ×3 IMPLANT
DRSG EMULSION OIL 3X3 NADH (GAUZE/BANDAGES/DRESSINGS) ×2 IMPLANT
DRSG MEPILEX BORDER 4X8 (GAUZE/BANDAGES/DRESSINGS) ×1 IMPLANT
DURAPREP 26ML APPLICATOR (WOUND CARE) ×2 IMPLANT
ELECT REM PT RETURN 9FT ADLT (ELECTROSURGICAL) ×2
ELECTRODE REM PT RTRN 9FT ADLT (ELECTROSURGICAL) ×1 IMPLANT
GLOVE BIOGEL PI IND STRL 7.5 (GLOVE) ×1 IMPLANT
GLOVE BIOGEL PI IND STRL 8 (GLOVE) ×1 IMPLANT
GLOVE BIOGEL PI INDICATOR 7.5 (GLOVE) ×1
GLOVE BIOGEL PI INDICATOR 8 (GLOVE) ×1
GLOVE ECLIPSE 7.0 STRL STRAW (GLOVE) ×2 IMPLANT
GLOVE ECLIPSE 7.5 STRL STRAW (GLOVE) ×1 IMPLANT
GLOVE ORTHO TXT STRL SZ7.5 (GLOVE) ×2 IMPLANT
GOWN PREVENTION PLUS LG XLONG (DISPOSABLE) ×1 IMPLANT
GOWN STRL NON-REIN LRG LVL3 (GOWN DISPOSABLE) ×4 IMPLANT
GOWN STRL REIN 2XL LVL4 (GOWN DISPOSABLE) ×1 IMPLANT
KIT BASIN OR (CUSTOM PROCEDURE TRAY) ×2 IMPLANT
KIT ROOM TURNOVER OR (KITS) ×2 IMPLANT
MANIFOLD NEPTUNE II (INSTRUMENTS) ×1 IMPLANT
NDL HYPO 25GX1X1/2 BEV (NEEDLE) ×1 IMPLANT
NDL SPNL 18GX3.5 QUINCKE PK (NEEDLE) ×1 IMPLANT
NDL SUT .5 MAYO 1.404X.05X (NEEDLE) ×1 IMPLANT
NEEDLE HYPO 25GX1X1/2 BEV (NEEDLE) ×2 IMPLANT
NEEDLE MAYO TAPER (NEEDLE)
NEEDLE SPNL 18GX3.5 QUINCKE PK (NEEDLE) ×2 IMPLANT
NS IRRIG 1000ML POUR BTL (IV SOLUTION) ×2 IMPLANT
PACK LAMINECTOMY ORTHO (CUSTOM PROCEDURE TRAY) ×2 IMPLANT
PAD ARMBOARD 7.5X6 YLW CONV (MISCELLANEOUS) ×4 IMPLANT
PATTIES SURGICAL .5 X.5 (GAUZE/BANDAGES/DRESSINGS) ×1 IMPLANT
PATTIES SURGICAL .75X.75 (GAUZE/BANDAGES/DRESSINGS) IMPLANT
SEALANT DURASEAL SPINE 3ML (Neuro Prosthesis/Implant) ×2 IMPLANT
SPONGE GAUZE 4X4 12PLY (GAUZE/BANDAGES/DRESSINGS) ×1 IMPLANT
STRIP CLOSURE SKIN 1/2X4 (GAUZE/BANDAGES/DRESSINGS) IMPLANT
SUT VIC AB 0 CT1 27 (SUTURE) ×2
SUT VIC AB 0 CT1 27XBRD ANBCTR (SUTURE) IMPLANT
SUT VIC AB 2-0 CT1 27 (SUTURE) ×2
SUT VIC AB 2-0 CT1 TAPERPNT 27 (SUTURE) ×1 IMPLANT
SUT VICRYL 0 TIES 12 18 (SUTURE) ×1 IMPLANT
SUT VICRYL 4-0 PS2 18IN ABS (SUTURE) ×1 IMPLANT
SUT VICRYL AB 2 0 TIES (SUTURE) ×1 IMPLANT
SYR 20ML ECCENTRIC (SYRINGE) IMPLANT
SYR CONTROL 10ML LL (SYRINGE) ×2 IMPLANT
TOWEL OR 17X24 6PK STRL BLUE (TOWEL DISPOSABLE) ×2 IMPLANT
TOWEL OR 17X26 10 PK STRL BLUE (TOWEL DISPOSABLE) ×2 IMPLANT
WATER STERILE IRR 1000ML POUR (IV SOLUTION) ×1 IMPLANT

## 2013-01-06 NOTE — Transfer of Care (Signed)
Immediate Anesthesia Transfer of Care Note  Patient: Ryan [name]  Procedure(s) Performed: Procedure(s) (LRB) with comments: MICRODISCECTOMY LUMBAR LAMINECTOMY (N/A) - L3-4 decompression  Patient Location: PACU  Anesthesia Type:General  Level of Consciousness: awake and sedated  Airway & Oxygen Therapy: Patient Spontanous Breathing and Patient connected to face mask oxygen  Post-op Assessment: Report given to PACU RN and Post -op Vital signs reviewed and stable  Post vital signs: Reviewed and stable  Complications: No apparent anesthesia complications

## 2013-01-06 NOTE — Anesthesia Preprocedure Evaluation (Signed)
Anesthesia Evaluation  Patient identified by MRN, date of birth, ID band Patient awake    Reviewed: Allergy & Precautions, H&P , NPO status , Patient's Chart, lab work & pertinent test results  Airway Mallampati: I TM Distance: >3 FB Neck ROM: full    Dental   Pulmonary          Cardiovascular hypertension, + CAD, + Past MI and + Peripheral Vascular Disease Rhythm:regular Rate:Normal     Neuro/Psych    GI/Hepatic   Endo/Other  Hyperthyroidism   Renal/GU      Musculoskeletal   Abdominal   Peds  Hematology   Anesthesia Other Findings   Reproductive/Obstetrics                           Anesthesia Physical Anesthesia Plan  ASA: III  Anesthesia Plan: General   Post-op Pain Management:    Induction: Intravenous  Airway Management Planned: Oral ETT  Additional Equipment:   Intra-op Plan:   Post-operative Plan: Extubation in OR  Informed Consent: I have reviewed the patients History and Physical, chart, labs and discussed the procedure including the risks, benefits and alternatives for the proposed anesthesia with the patient or authorized representative who has indicated his/her understanding and acceptance.     Plan Discussed with: CRNA, Anesthesiologist and Surgeon  Anesthesia Plan Comments:         Anesthesia Quick Evaluation

## 2013-01-06 NOTE — Interval H&P Note (Signed)
History and Physical Interval Note:  01/06/2013 9:18 AM  Ryan [name]  has presented today for surgery, with the diagnosis of L3-4 stenosis  The various methods of treatment have been discussed with the patient and family. After consideration of risks, benefits and other options for treatment, the patient has consented to  Procedure(s) (LRB) with comments: MICRODISCECTOMY LUMBAR LAMINECTOMY (N/A) - L3-4 decompression as a surgical intervention .  The patient's history has been reviewed, patient examined, no change in status, stable for surgery.  I have reviewed the patient's chart and labs.  Questions were answered to the patient's satisfaction.     Ryan [name]  Patient has arachnoiditis and prev. Decompression with severe stenosis last year but has had persistant claudication symptoms. L3-4 mod tight with stenosis, he has progressed to the point he has to use a walker to ambulate with increased pain back and right leg with ambulation. No [name]-spine pain, full ROM of cervical spine without pain, no T-spine pain or rib dermatome pain and neuro exam show no evidence of upper level compression . Has been worked up with lumbar MRI with moderated stenosis at L3-4 and after myelogram recently done he has been increasingly weak with now use of walker. Has normal pulses, he understands that some of his pain and weakness may be from previous 2 surgeries ( other surgeons) and that decompression of the L3-4 level that is moderately tight may not give him the relief of pain and weakness he desires. Discussed with Dr. Janece Canterbury cardiology yesterday his cardiac status and he has been cleared to proceed.   Risks discussed, he requests we proceed.

## 2013-01-06 NOTE — Brief Op Note (Cosign Needed)
01/06/2013  12:01 PM  PATIENT:  Ryan [name]  72 y.o. male  PRE-OPERATIVE DIAGNOSIS:  L3-4 stenosis  POST-OPERATIVE DIAGNOSIS:  L3-4 stenosis  PROCEDURE:  Procedure(s) (LRB) with comments: MICRODISCECTOMY LUMBAR LAMINECTOMY (N/A) - L3-4 decompression  SURGEON:  Surgeon(s) and Role:    * Eldred Manges, MD - Primary  PHYSICIAN ASSISTANT: Maud Deed [facility] ASSISTANTS: none   ANESTHESIA:   general  EBL:  Total I/O In: 1850 [I.V.:1600; IV Piggyback:250] Out: 100 [Blood:100]  BLOOD ADMINISTERED:none  DRAINS: none   LOCAL MEDICATIONS USED:  MARCAINE     SPECIMEN:  No Specimen  DISPOSITION OF SPECIMEN:  N/A  COUNTS:  YES  TOURNIQUET:  * No tourniquets in log *  DICTATION: .Note written in EPIC  PLAN OF CARE: Admit for overnight observation  PATIENT DISPOSITION:  PACU - hemodynamically stable.   Delay start of Pharmacological VTE agent (>24hrs) due to surgical blood loss or risk of bleeding: yes

## 2013-01-06 NOTE — Plan of Care (Signed)
Problem: Consults Goal: Diagnosis - Spinal Surgery Microdiscectomy and Laminectomy, L3-4

## 2013-01-06 NOTE — Anesthesia Postprocedure Evaluation (Signed)
  Anesthesia Post-op Note  Patient: Ryan [name]  Procedure(s) Performed: Procedure(s) (LRB) with comments: MICRODISCECTOMY LUMBAR LAMINECTOMY (N/A) - L3-4 decompression  Patient Location: PACU  Anesthesia Type:General  Level of Consciousness: awake  Airway and Oxygen Therapy: Patient Spontanous Breathing  Post-op Pain: mild  Post-op Assessment: Post-op Vital signs reviewed  Post-op Vital Signs: Reviewed  Complications: No apparent anesthesia complications

## 2013-01-07 DIAGNOSIS — I2510 Atherosclerotic heart disease of native coronary artery without angina pectoris: Secondary | ICD-10-CM | POA: Diagnosis not present

## 2013-01-07 DIAGNOSIS — M48062 Spinal stenosis, lumbar region with neurogenic claudication: Secondary | ICD-10-CM | POA: Diagnosis not present

## 2013-01-07 DIAGNOSIS — I10 Essential (primary) hypertension: Secondary | ICD-10-CM | POA: Diagnosis not present

## 2013-01-07 DIAGNOSIS — G988 Other disorders of nervous system: Secondary | ICD-10-CM | POA: Diagnosis not present

## 2013-01-07 DIAGNOSIS — E0590 Thyrotoxicosis, unspecified without thyrotoxic crisis or storm: Secondary | ICD-10-CM | POA: Diagnosis not present

## 2013-01-07 DIAGNOSIS — Q762 Congenital spondylolisthesis: Secondary | ICD-10-CM | POA: Diagnosis not present

## 2013-01-07 NOTE — Op Note (Signed)
NAMEWEBBER, MICHIELS NO.:  [id number]  MEDICAL RECORD NO.:  [id number]  LOCATION:  5N01C                        FACILITY:  [facility]  PHYSICIAN:  Geisha Abernathy C. Ophelia Charter, M.D.    DATE OF BIRTH:  [birth year]  DATE OF PROCEDURE:  01/06/2013 DATE OF DISCHARGE:  01/07/2013                              OPERATIVE REPORT   PREOPERATIVE DIAGNOSIS:  L3-4 stenosis.  POSTOPERATIVE DIAGNOSIS:  L3-4 stenosis.  PROCEDURE:  L3-4 central decompression.  SURGEON:  Xiong Haidar C. Ophelia Charter, M.D.  ANESTHESIA:  General.  ASSISTANT:  Wende Neighbors, PA-C, medically necessary and present for the entire procedure.  EBL:  Minimal.  DESCRIPTION OF PROCEDURE:  After induction general anesthesia and preoperative Ancef prophylaxis, the patient was placed in the prone position on chest rolls, careful padding positioning, rolled sponge pads over the shoulder as well as the ulnar nerve at the elbow.  Back was prepped with DuraPrep.  The old incision was noted.  A 10-15 drape was placed transversely at S1.  Area was squared with towels, sterile skin marker was used on the old incision and Betadine, Steri-Drape applied with laminectomy sheet and drape.  Needle was placed, 18-gauge, then moving the trocar from the needle and moving it slightly up.  Cross- table lateral x-ray was taken that showed that the two needles were caudad and the most distal needle was over the 4-5 disk level.  Skin was marked extending the old incision the patient had had.  The patient had an old history, had decompression by Dr. Corlis Hove at the 5-1 level.  He had fairly significant spinal ankylosis and had surgery at the 5-1 level more than 15 years ago.  He did well for a period of time and then over a year ago started having increasing symptoms of spinal stenosis with neurogenic claudication and workup at that time by Dr. 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  The patient had 1-level decompression.  He states he really never did well after the surgery, continued to have pain, weakness, not able to ambulate.  Had vascular studies with ankle brachial indices that were 0.95 to 1.07, and he did have palpable pulses.  He had had activity modification, epidurals.  MRI scan showed moderate stenosis at the 3-4 level with disk osteophyte complex present with moderate stenosis.  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  Since he continued to have difficulty walking, have been using a cane.  A Myelogram CT scan was obtained since he continued to have significant symptoms and this again showed clumping of the nerve roots at the L3-4 down level.  The needle was placed at the top level of the L4 pedicle at the midline and this was done last week.  After the Myelogram CT, the patient states he had increasing pain, have to use a walker to ambulate.  Legs are giving way.  He states he could not stand to waiting longer and requested to proceeding with the decompression surgery at the 3-4 level.  Long discussion was held with the patient that his previous surgery  was at the 4-5 level where he was severely tight and he did not show significant improvement and decompression. Moderate compression at 3-4 might not give him the improvement that he was hoping.  Procedure with midline incision based on the needle localization initially and subperiosteal dissection down to the lamina with placement of self-retaining retractor, Kocher clamp was placed at the top and the bottom of the planned decompression and the second x-ray was taken confirming appropriate level.  Lamina was removed as well as the spinous process.  The top portion of L4 was still present previously and the patient had 2-3 mm of retrolisthesis at L3-4, which contributed to the stenosis.  Thick chunks of ligament were removed.  There was  hourglass deformity of the dura at the level of the disk.  Bone was removed out to the level of the pedicle both right and left.  Some overhanging spurs were removed.  At the midline with operative microscope, the two pinpoint holes where the patient had the myelogram was identified. There was spinal fluid leakage from the pinpoint hole.  Continued to remove of chunks of ligaments distal this taking the top portion of the lamina at L4 that was still present was performed.  Patties were used to protect the area and once this was dried up with new dry patty, some DuraSeal was mixed as the tube was being squeezed, it was not tightened and not connected well and some loss of medication occurred.  A second was mixed up, this was tightened down securely, fixed probably with the nozzle.  Then once the two pinpoint holes were dried up again, it was dripped down on top of the dura.  Valsalva was performed passed the 30 cm of water with no leakage.  Both gutters had been run with hockey stick.  The disk was firm on both the right and left sides.  Overhanging spurs and thick chunks of ligaments had been removed and there was no remaining extradural compression.  Repeat Valsalva again was performed. Field was dry.  Wound was irrigated, standard layered closure with #1 Vicryl, 2-0 Vicryl in the subcutaneous tissue, subcuticular closure. Dermabond on the skin.  Postop dressing, and transferred to the recovery room in stable condition.  Instrument count and needle count were correct.  The patient had intact EHL, anterior tib quads, hamstrings, and gastrocsoleus in the recovery room.     Dajanae Brophy C. Ophelia Charter, M.D.     MCY/MEDQ  D:  01/06/2013  T:  01/07/2013  Job:  [id number]

## 2013-01-07 NOTE — Progress Notes (Signed)
Subjective: Pt stable wants to leave   Objective: Vital signs in last 24 hours: Temp:  [97.4 F (36.3 C)-99.4 F (37.4 C)] 98.6 F (37 C) (01/18 0520) Pulse Rate:  [70-100] 73  (01/18 0520) Resp:  [11-26] 18  (01/18 0520) BP: (103-170)/(53-81) 103/70 mmHg (01/18 0520) SpO2:  [97 %-100 %] 98 % (01/18 0520)  Intake/Output from previous day: 01/17 0701 - 01/18 0700 In: 3350 [P.O.:600; I.V.:2500; IV Piggyback:250] Out: 2600 [Urine:2500; Blood:100] Intake/Output this shift:    Exam:  Neurovascular intact Sensation intact distally Intact pulses distally  Labs: No results found for this basename: HGB:5 in the last 72 hours No results found for this basename: WBC:2,RBC:2,HCT:2,PLT:2 in the last 72 hours No results found for this basename: NA:2,K:2,CL:2,CO2:2,BUN:2,CREATININE:2,GLUCOSE:2,CALCIUM:2 in the last 72 hours No results found for this basename: LABPT:2,INR:2 in the last 72 hours  Assessment/Plan: Dc today   Feliza Diven SCOTT 01/07/2013, 8:22 AM

## 2013-01-09 ENCOUNTER — Encounter (HOSPITAL_COMMUNITY): Payer: Self-pay | Admitting: Orthopaedic Surgery

## 2013-01-24 NOTE — Discharge Summary (Signed)
Physician Discharge Summary  Patient ID: Ryan [name] MRN: [id number] DOB/AGE: Apr 30, 1941 72 y.o.  Admit date: 01/06/2013 Discharge date: 01/24/2013  Admission Diagnoses:  Spinal stenosis, lumbar L3-4  Discharge Diagnoses:  Principal Problem:  *Spinal stenosis, lumbar L3-4  Past Medical History  Diagnosis Date  . Arthritis   . Myocardial infarction     46    dr Clelia Croft PCP          . Hyperlipidemia     takes Crestor daily  . Peripheral vascular disease   . Carotid artery occlusion   . Hypertension     takes Amlodipine daily  . Chronic back pain   . History of gout     has colchicine prn  . History of colon polyps   . Urinary frequency   . Urinary urgency   . Enlarged prostate     takes Rapaflo daily  . History of kidney stones   . Hyperthyroidism     takes SYnthroid daily as instructed    Surgeries: Procedure(s): MICRODISCECTOMY LUMBAR LAMINECTOMY at L3-4 on 01/06/2013   Consultants (if any):  none  Discharged Condition: Improved  Hospital Course: Ryan [name] is an 72 y.o. male who was admitted 01/06/2013 with a diagnosis of Spinal stenosis, lumbar and went to the operating room on 01/06/2013 and underwent the above named procedures.    He was given perioperative antibiotics:  Anti-infectives     Start     Dose/Rate Route Frequency Ordered Stop   01/06/13 1800   ceFAZolin (ANCEF) IVPB 1 g/50 mL premix        1 g 100 mL/hr over 30 Minutes Intravenous Every 8 hours 01/06/13 1418 01/07/13 0330   01/06/13 0600   ceFAZolin (ANCEF) IVPB 2 g/50 mL premix        2 g 100 mL/hr over 30 Minutes Intravenous On call to O.R. 01/05/13 1453 01/06/13 1030        .  He was given sequential compression devices, early ambulation for DVT prophylaxis.  He benefited maximally from the hospital stay and there were no complications.    Recent vital signs:  Filed Vitals:   01/07/13 0520  BP: 103/70  Pulse: 73  Temp: 98.6 F (37 C)  Resp: 18    Recent laboratory  studies:  Lab Results  Component Value Date   HGB 15.5 01/03/2013   HGB 14.2 02/15/2012   HGB 15.0 02/05/2012   Lab Results  Component Value Date   WBC 10.4 01/03/2013   PLT 311 01/03/2013   Lab Results  Component Value Date   INR 1.0 03/06/2009   Lab Results  Component Value Date   NA 141 01/03/2013   K 4.6 01/03/2013   CL 103 01/03/2013   CO2 27 01/03/2013   BUN 16 01/03/2013   CREATININE 1.05 01/03/2013   GLUCOSE 94 01/03/2013    Discharge Medications:     Medication List     As of 01/24/2013 10:52 AM    TAKE these medications         amLODipine 5 MG tablet   Commonly known as: NORVASC   Take 5 mg by mouth daily.      aspirin EC 81 MG tablet   Take 81 mg by mouth daily.      gabapentin 100 MG capsule   Commonly known as: NEURONTIN   Take 100-300 mg by mouth 3 (three) times daily. Take 1 cap in the morning and afternoon. Take 3 caps in the evening  levothyroxine 75 MCG tablet   Commonly known as: SYNTHROID, LEVOTHROID   Take 75 mcg by mouth daily.      metoprolol tartrate 25 MG tablet   Commonly known as: LOPRESSOR   Take 1 tablet (25 mg total) by mouth 2 (two) times daily.      oxyCODONE-acetaminophen 5-325 MG per tablet   Commonly known as: PERCOCET/ROXICET   Take 1 tablet by mouth every 4 (four) hours as needed for pain.      promethazine 25 MG tablet   Commonly known as: PHENERGAN   Take 25 mg by mouth every 6 (six) hours as needed.      rosuvastatin 10 MG tablet   Commonly known as: CRESTOR   Take 10 mg by mouth daily.      silodosin 8 MG Caps capsule   Commonly known as: RAPAFLO   Take 8 mg by mouth daily with breakfast.        Diagnostic Studies: Dg Lumbar Spine 2-3 Views  01/06/2013  *RADIOLOGY REPORT*  Clinical Data: Lumbar microdiskectomy.  LUMBAR SPINE - 2-3 VIEW  Comparison: CT of the lumbar spine of 12/27/2012.  Findings: Two intraoperative cross-table lateral views of the lumbar spine are submitted.  The first image demonstrates surgical  probes in place posterior to the L4-L5 and L3-L4 interspaces.  On the second view, there is a soft tissue retractor in place posteriorly, and surgical probes are located posterior to the L3-L4 interspace and posterior to the L3 pedicles.  IMPRESSION: 1.  Intraoperative localization, as above.   Original Report Authenticated By: Trudie Reed, M.D.    Ct Lumbar Spine W Contrast  12/27/2012  *RADIOLOGY REPORT*  Clincal data: Previous decompressive surgery.  Continued pain.  LUMBAR MYELOGRAM CT LUMBAR SPINE WITH INTRATHECAL CONTRAST  Technique: The procedure, risks (including but not limited to bleeding, infection, organ damage), benefits, and alternatives were explained to the patient.  Questions regarding the procedure were encouraged and answered.  The patient understands and consents to the procedure. An appropriate entry site was determined under fluoroscopy. Operator donned sterile gloves and mask. Skin site was marked, prepped with Betadine, and draped in usual sterile fashion, and infiltrated locally with 1% lidocaine. A 22 gauge spinal needle was  advanced into the thecal sac at L4 from a right parasagittal approach. Clear colorless CSF returned. 17 ml Omnipaque 180 were administered intrathecally for lumbar myelography, followed by axial CT scanning of the lumbar spine. I personally performed the lumbar puncture and administered the intrathecal contrast. I also personally supervised acquisition of the myelogram images. Coronal and sagittal reconstructions were generated from the axial scan.  Comparison: MR 11/11/2012  Findings:  Numbering scheme reflects that used on the previous MR.  T12-L1:  Bridging anterior osteophytes.  No bulge or protrusion.  L1-2: Large anterior endplate osteophytes.  No disc bulge or protrusion.  L2-3: Bridging anterior osteophytes.  Minimal circumferential disc bulge, partially calcified.  No central canal or foraminal stenosis.  L3-4: There is a broad left paracentral and  foraminal protrusion, partially calcified.  There is clumping of the nerve roots in the thecal sac extending inferiorly from this level.  There is mild narrowing of the central canal.  There is early bilateral foraminal encroachment. There is 2 mm retrolisthesis of L3 and L4.  No evidence of dynamic instability on standing lateral flexion and extension radiographs.  L4-5: Interval posterior decompression with marked clumping of the nerve roots in the thecal sac.  Bilateral facet degenerative hypertrophy.  Moderate circumferential  disc bulge, partially calcified.  Mild encroachment on the neural foramina, left greater than right.  L5-S1: Continuation of posterior decompression.  Mild bilateral facet degenerative hypertrophy.  Mild circumferential disc bulge, partially calcified, with large bridging osteophytes anteriorly. Clumping of nerve roots in the thecal sac continues across this level.  Previous aortobifem graft is partially seen.  Remainder paraspinal soft tissues unremarkable.  IMPRESSION:  1.  Mild disc bulge L2-3 without compressive pathology. 2.  Left paracentral/foraminal protrusion L3-4 with foraminal encroachment and mild spinal stenosis. 3.  Marked clumping of the nerve roots in the thecal sac L4-S1 suggesting arachnoiditis. 4.  Changes of posterior decompression L4-5, L5-S1.   Original Report Authenticated By: D. Andria Rhein, MD    Dg Myelogram Lumbar  12/27/2012  *RADIOLOGY REPORT*  Clincal data: Previous decompressive surgery.  Continued pain.  LUMBAR MYELOGRAM CT LUMBAR SPINE WITH INTRATHECAL CONTRAST  Technique: The procedure, risks (including but not limited to bleeding, infection, organ damage), benefits, and alternatives were explained to the patient.  Questions regarding the procedure were encouraged and answered.  The patient understands and consents to the procedure. An appropriate entry site was determined under fluoroscopy. Operator donned sterile gloves and mask. Skin site was marked,  prepped with Betadine, and draped in usual sterile fashion, and infiltrated locally with 1% lidocaine. A 22 gauge spinal needle was  advanced into the thecal sac at L4 from a right parasagittal approach. Clear colorless CSF returned. 17 ml Omnipaque 180 were administered intrathecally for lumbar myelography, followed by axial CT scanning of the lumbar spine. I personally performed the lumbar puncture and administered the intrathecal contrast. I also personally supervised acquisition of the myelogram images. Coronal and sagittal reconstructions were generated from the axial scan.  Comparison: MR 11/11/2012  Findings:  Numbering scheme reflects that used on the previous MR.  T12-L1:  Bridging anterior osteophytes.  No bulge or protrusion.  L1-2: Large anterior endplate osteophytes.  No disc bulge or protrusion.  L2-3: Bridging anterior osteophytes.  Minimal circumferential disc bulge, partially calcified.  No central canal or foraminal stenosis.  L3-4: There is a broad left paracentral and foraminal protrusion, partially calcified.  There is clumping of the nerve roots in the thecal sac extending inferiorly from this level.  There is mild narrowing of the central canal.  There is early bilateral foraminal encroachment. There is 2 mm retrolisthesis of L3 and L4.  No evidence of dynamic instability on standing lateral flexion and extension radiographs.  L4-5: Interval posterior decompression with marked clumping of the nerve roots in the thecal sac.  Bilateral facet degenerative hypertrophy.  Moderate circumferential disc bulge, partially calcified.  Mild encroachment on the neural foramina, left greater than right.  L5-S1: Continuation of posterior decompression.  Mild bilateral facet degenerative hypertrophy.  Mild circumferential disc bulge, partially calcified, with large bridging osteophytes anteriorly. Clumping of nerve roots in the thecal sac continues across this level.  Previous aortobifem graft is partially  seen.  Remainder paraspinal soft tissues unremarkable.  IMPRESSION:  1.  Mild disc bulge L2-3 without compressive pathology. 2.  Left paracentral/foraminal protrusion L3-4 with foraminal encroachment and mild spinal stenosis. 3.  Marked clumping of the nerve roots in the thecal sac L4-S1 suggesting arachnoiditis. 4.  Changes of posterior decompression L4-5, L5-S1.   Original Report Authenticated By: D. Andria Rhein, MD     Disposition: 01-Home or Self Care      Discharge Orders    Future Appointments: Provider: Department: Dept Phone: Center:   02/06/2013 8:45  AM Vesta Mixer, MD [facility] Main Office [place]) [phone] [id number]   10/24/2013 1:00 PM [facility] [phone] [facility]   10/24/2013 2:00 PM [facility] [phone] [facility]   10/24/2013 3:00 PM Evern Bio, NP [facility] [phone] [facility]     Future Orders Please Complete By Expires   Diet - low sodium heart healthy      Call MD / Call 911      Comments:   If you experience chest pain or shortness of breath, CALL 911 and be transported to the hospital emergency room.  If you develope a fever above 101 F, pus (white drainage) or increased drainage or redness at the wound, or calf pain, call your surgeon's office.   Constipation Prevention      Comments:   Drink plenty of fluids.  Prune juice may be helpful.  You may use a stool softener, such as Colace (over the counter) 100 mg twice a day.  Use MiraLax (over the counter) for constipation as needed.   Increase activity slowly as tolerated       No lifting greater than 10 lbs. Avoid bending, stooping and twisting. Walk in house for first week them may start to get out slowly increasing distance up to one mile by 3 weeks post op. Keep incision dry for 3 days, may use tegaderm or similar water impervious dressing.    Follow-up Information    Follow up  with Eldred Manges, MD. Schedule an appointment as soon as possible for a visit in 2 weeks.   Contact information:   [address] [place] [address] [phone]           Signed: Wende Neighbors 01/24/2013, 10:52 AM

## 2013-02-06 ENCOUNTER — Other Ambulatory Visit: Payer: Self-pay | Admitting: *Deleted

## 2013-02-06 ENCOUNTER — Ambulatory Visit: Payer: Medicare Other | Admitting: Cardiovascular Disease

## 2013-03-09 ENCOUNTER — Ambulatory Visit: Payer: Medicare Other | Attending: Internal Medicine | Admitting: Rehabilitation

## 2013-03-09 DIAGNOSIS — M2550 Pain in unspecified joint: Secondary | ICD-10-CM | POA: Insufficient documentation

## 2013-03-09 DIAGNOSIS — IMO0001 Reserved for inherently not codable concepts without codable children: Secondary | ICD-10-CM | POA: Diagnosis not present

## 2013-03-09 DIAGNOSIS — R262 Difficulty in walking, not elsewhere classified: Secondary | ICD-10-CM | POA: Insufficient documentation

## 2013-03-09 DIAGNOSIS — M2560 Stiffness of unspecified joint, not elsewhere classified: Secondary | ICD-10-CM | POA: Insufficient documentation

## 2013-03-09 DIAGNOSIS — R293 Abnormal posture: Secondary | ICD-10-CM | POA: Insufficient documentation

## 2013-03-14 ENCOUNTER — Ambulatory Visit: Payer: Medicare Other | Admitting: Physical Therapy

## 2013-03-14 DIAGNOSIS — R293 Abnormal posture: Secondary | ICD-10-CM | POA: Diagnosis not present

## 2013-03-14 DIAGNOSIS — M2560 Stiffness of unspecified joint, not elsewhere classified: Secondary | ICD-10-CM | POA: Diagnosis not present

## 2013-03-14 DIAGNOSIS — M2550 Pain in unspecified joint: Secondary | ICD-10-CM | POA: Diagnosis not present

## 2013-03-14 DIAGNOSIS — R262 Difficulty in walking, not elsewhere classified: Secondary | ICD-10-CM | POA: Diagnosis not present

## 2013-03-14 DIAGNOSIS — IMO0001 Reserved for inherently not codable concepts without codable children: Secondary | ICD-10-CM | POA: Diagnosis not present

## 2013-03-16 ENCOUNTER — Ambulatory Visit: Payer: Medicare Other | Admitting: Rehabilitation

## 2013-03-16 DIAGNOSIS — M2560 Stiffness of unspecified joint, not elsewhere classified: Secondary | ICD-10-CM | POA: Diagnosis not present

## 2013-03-16 DIAGNOSIS — R293 Abnormal posture: Secondary | ICD-10-CM | POA: Diagnosis not present

## 2013-03-16 DIAGNOSIS — IMO0001 Reserved for inherently not codable concepts without codable children: Secondary | ICD-10-CM | POA: Diagnosis not present

## 2013-03-16 DIAGNOSIS — M2550 Pain in unspecified joint: Secondary | ICD-10-CM | POA: Diagnosis not present

## 2013-03-16 DIAGNOSIS — R262 Difficulty in walking, not elsewhere classified: Secondary | ICD-10-CM | POA: Diagnosis not present

## 2013-03-21 ENCOUNTER — Ambulatory Visit: Payer: Medicare Other | Admitting: Physical Therapy

## 2013-03-23 ENCOUNTER — Ambulatory Visit: Payer: Medicare Other | Attending: Rehabilitation | Admitting: Physical Therapy

## 2013-03-23 ENCOUNTER — Ambulatory Visit: Payer: Medicare Other | Admitting: Cardiovascular Disease

## 2013-03-23 DIAGNOSIS — M2550 Pain in unspecified joint: Secondary | ICD-10-CM | POA: Diagnosis not present

## 2013-03-23 DIAGNOSIS — R262 Difficulty in walking, not elsewhere classified: Secondary | ICD-10-CM | POA: Insufficient documentation

## 2013-03-23 DIAGNOSIS — R293 Abnormal posture: Secondary | ICD-10-CM | POA: Diagnosis not present

## 2013-03-23 DIAGNOSIS — IMO0001 Reserved for inherently not codable concepts without codable children: Secondary | ICD-10-CM | POA: Diagnosis not present

## 2013-03-23 DIAGNOSIS — M2560 Stiffness of unspecified joint, not elsewhere classified: Secondary | ICD-10-CM | POA: Insufficient documentation

## 2013-03-27 ENCOUNTER — Encounter: Payer: Medicare Other | Admitting: Physical Therapy

## 2013-03-27 ENCOUNTER — Ambulatory Visit: Payer: Medicare Other | Admitting: Physical Therapy

## 2013-03-27 DIAGNOSIS — M2560 Stiffness of unspecified joint, not elsewhere classified: Secondary | ICD-10-CM | POA: Diagnosis not present

## 2013-03-27 DIAGNOSIS — R293 Abnormal posture: Secondary | ICD-10-CM | POA: Diagnosis not present

## 2013-03-27 DIAGNOSIS — M2550 Pain in unspecified joint: Secondary | ICD-10-CM | POA: Diagnosis not present

## 2013-03-27 DIAGNOSIS — IMO0001 Reserved for inherently not codable concepts without codable children: Secondary | ICD-10-CM | POA: Diagnosis not present

## 2013-03-27 DIAGNOSIS — R262 Difficulty in walking, not elsewhere classified: Secondary | ICD-10-CM | POA: Diagnosis not present

## 2013-03-30 ENCOUNTER — Ambulatory Visit: Payer: Medicare Other | Admitting: Physical Therapy

## 2013-04-04 ENCOUNTER — Ambulatory Visit: Payer: Medicare Other | Admitting: Physical Therapy

## 2013-04-06 ENCOUNTER — Ambulatory Visit: Payer: Medicare Other | Admitting: Physical Therapy

## 2013-04-10 ENCOUNTER — Ambulatory Visit: Payer: Medicare Other | Admitting: Cardiovascular Disease

## 2013-05-29 ENCOUNTER — Ambulatory Visit: Payer: Medicare Other | Admitting: Cardiovascular Disease

## 2013-05-29 ENCOUNTER — Encounter: Payer: Self-pay | Admitting: Cardiovascular Disease

## 2013-05-29 ENCOUNTER — Ambulatory Visit (INDEPENDENT_AMBULATORY_CARE_PROVIDER_SITE_OTHER): Payer: Medicare Other | Admitting: Cardiovascular Disease

## 2013-05-29 VITALS — BP 125/78 | HR 76 | Ht 67.0 in | Wt 182.0 lb

## 2013-05-29 DIAGNOSIS — I2510 Atherosclerotic heart disease of native coronary artery without angina pectoris: Secondary | ICD-10-CM | POA: Diagnosis not present

## 2013-05-29 NOTE — Patient Instructions (Addendum)
Your physician wants you to follow-up in: 1 YEAR.  You will receive a reminder letter in the mail two months in advance. If you don't receive a letter, please call our office to schedule the follow-up appointment.  Your physician recommends that you continue on your current medications as directed. Please refer to the Current Medication list given to you today.  

## 2013-05-29 NOTE — Assessment & Plan Note (Addendum)
Ryan [name] is  not having any cardiac problems. He continues to have lots of back problems. He has an old occlusion of his right coronary artery with mild irregularities of his left system.  I'll see him again in 1 year.

## 2013-05-29 NOTE — Progress Notes (Signed)
Orson Eva Date of Birth  05-17-1941       [facility]    [place] [address], [address]  [address], [address] [place], [place]  [address]   [place], [place]  [address] [phone]     [phone]   Fax  [phone]    Fax [phone]  Problem List: 1. Coronary artery disease -  Cath 2010 - he has moderate irregularities in the LAD and circumflex. The first diagonal has a tight stenosis. His right coronary artery is chronically occluded with intact filling from the left system. 2. Hypertension 3. Hyperlipidemia   History of Present Illness:  I last saw him approximately 3-4 years ago. We performed a heart catheterization which revealed complete occlusion of his proximal right coronary artery with collateral filling from the left system. He has moderate diffuse irregularities in the  LAD and circumflex. He has  done well on medical therapy. He denies any chest pain or problems.  Up to  this week he was able to climb 2 flights of stairs without any difficulty.  He was walking 4 miles a day prior to his back problem.   He denies any episodes of chest pain or shortness of breath.  His main problem now is that of back pain. He has had 4 surgeries on his back so far and is scheduled have a repeat back surgery soon.  He's not able to exercise.  He's been sleeping in a chair.  He is in fairly excruciating pain. I discussed the case with Dr. Ophelia Charter.   May 29, 2013:   He is doing well from a cardiac standpoint.  He has had 2 back surgeries but still has lots of back and hip pain.    Current Outpatient Prescriptions on File Prior to Visit  Medication Sig Dispense Refill  . amLODipine (NORVASC) 5 MG tablet Take 5 mg by mouth daily.      Marland Kitchen aspirin EC 81 MG tablet Take 81 mg by mouth daily.      . rosuvastatin (CRESTOR) 10 MG tablet Take 10 mg by mouth daily.      Marland Kitchen gabapentin (NEURONTIN) 100 MG capsule Take 100-300 mg by mouth 3 (three) times daily. Take 1  cap in the morning and afternoon. Take 3 caps in the evening      . levothyroxine (SYNTHROID, LEVOTHROID) 75 MCG tablet Take 75 mcg by mouth daily.      . metoprolol tartrate (LOPRESSOR) 25 MG tablet Take 1 tablet (25 mg total) by mouth 2 (two) times daily.  60 tablet  6  . promethazine (PHENERGAN) 25 MG tablet Take 25 mg by mouth every 6 (six) hours as needed.      . silodosin (RAPAFLO) 8 MG CAPS capsule Take 8 mg by mouth daily with breakfast.       No current facility-administered medications on file prior to visit.    Allergies  Allergen Reactions  . Codeine Nausea And Vomiting         Past Medical History  Diagnosis Date  . Arthritis   . Myocardial infarction     58    dr Clelia Croft PCP          . Hyperlipidemia     takes Crestor daily  . Peripheral vascular disease   . Carotid artery occlusion   . Hypertension     takes Amlodipine daily  . Chronic back pain   . History of gout  has colchicine prn  . History of colon polyps   . Urinary frequency   . Urinary urgency   . Enlarged prostate     takes Rapaflo daily  . History of kidney stones   . Hyperthyroidism     takes SYnthroid daily as instructed    Past Surgical History  Procedure Laterality Date  . Femoral artery - popliteal artery bypass graft    . Joint replacement      shoulder  . Back surgery      lumber   x  3  . Lumbar laminectomy/decompression microdiscectomy  02/12/2012    Procedure: LUMBAR LAMINECTOMY/DECOMPRESSION MICRODISCECTOMY;  Surgeon: [name], MD;  Location: [facility];  Service: Neurosurgery;  Laterality: N/A;  Lumbar four-five laminectomy  . Appendectomy    . Cataract surgery    . Colonoscopy    . Cardiac catheterization      2010    dr Elease Hashimoto  . Big toe surgery    . Cystoscopy    . Lumbar laminectomy  01/06/2013    Procedure: MICRODISCECTOMY LUMBAR LAMINECTOMY;  Surgeon: Eldred Manges, MD;  Location: [facility] OR;  Service: Orthopedics;  Laterality: N/A;  L3-4 decompression    History   Smoking status  . Former Smoker  . Types: Cigarettes  . Quit date: 02/04/1991  Smokeless tobacco  . Current User  . Types: Chew    Comment: quit 35+yrs ago    History  Alcohol Use No    No family history on file.  Reviw of Systems:  Reviewed in the HPI.  All other systems are negative.  Physical Exam: Blood pressure 125/78, pulse 76, height 5\' 7"  (1.702 m), weight 182 lb (82.555 kg). General: Well developed, well nourished, in no acute distress.  Head: Normocephalic, atraumatic, sclera non-icteric, mucus membranes are moist,   Neck: Supple. Carotids are 2 + without bruits. No JVD   Lungs: Clear   Heart: RR, normal S1, S2  Abdomen: Soft, non-tender, non-distended with normal bowel sounds.  Msk:  Strength and tone are normal   Extremities: No clubbing or cyanosis. No edema.  Distal pedal pulses are 2+ and equal    Neuro: CN II - XII intact.  Alert and oriented X 3.   Psych:  Normal   ECG:  Assessment / Plan:

## 2013-06-05 DIAGNOSIS — I2510 Atherosclerotic heart disease of native coronary artery without angina pectoris: Secondary | ICD-10-CM | POA: Diagnosis not present

## 2013-06-05 DIAGNOSIS — I10 Essential (primary) hypertension: Secondary | ICD-10-CM | POA: Diagnosis not present

## 2013-06-05 DIAGNOSIS — Z125 Encounter for screening for malignant neoplasm of prostate: Secondary | ICD-10-CM | POA: Diagnosis not present

## 2013-06-05 DIAGNOSIS — E785 Hyperlipidemia, unspecified: Secondary | ICD-10-CM | POA: Diagnosis not present

## 2013-06-05 DIAGNOSIS — M109 Gout, unspecified: Secondary | ICD-10-CM | POA: Diagnosis not present

## 2013-06-05 DIAGNOSIS — E039 Hypothyroidism, unspecified: Secondary | ICD-10-CM | POA: Diagnosis not present

## 2013-06-06 DIAGNOSIS — M48061 Spinal stenosis, lumbar region without neurogenic claudication: Secondary | ICD-10-CM | POA: Diagnosis not present

## 2013-06-07 DIAGNOSIS — Z1331 Encounter for screening for depression: Secondary | ICD-10-CM | POA: Diagnosis not present

## 2013-06-07 DIAGNOSIS — R7301 Impaired fasting glucose: Secondary | ICD-10-CM | POA: Diagnosis not present

## 2013-06-07 DIAGNOSIS — I739 Peripheral vascular disease, unspecified: Secondary | ICD-10-CM | POA: Diagnosis not present

## 2013-06-07 DIAGNOSIS — I6529 Occlusion and stenosis of unspecified carotid artery: Secondary | ICD-10-CM | POA: Diagnosis not present

## 2013-06-07 DIAGNOSIS — I10 Essential (primary) hypertension: Secondary | ICD-10-CM | POA: Diagnosis not present

## 2013-06-07 DIAGNOSIS — E039 Hypothyroidism, unspecified: Secondary | ICD-10-CM | POA: Diagnosis not present

## 2013-06-07 DIAGNOSIS — Z0000 Encounter for general adult medical examination without abnormal findings: Secondary | ICD-10-CM | POA: Diagnosis not present

## 2013-06-07 DIAGNOSIS — I2510 Atherosclerotic heart disease of native coronary artery without angina pectoris: Secondary | ICD-10-CM | POA: Diagnosis not present

## 2013-06-07 DIAGNOSIS — Z125 Encounter for screening for malignant neoplasm of prostate: Secondary | ICD-10-CM | POA: Diagnosis not present

## 2013-06-07 DIAGNOSIS — E785 Hyperlipidemia, unspecified: Secondary | ICD-10-CM | POA: Diagnosis not present

## 2013-06-07 DIAGNOSIS — Z23 Encounter for immunization: Secondary | ICD-10-CM | POA: Diagnosis not present

## 2013-06-08 DIAGNOSIS — Z1212 Encounter for screening for malignant neoplasm of rectum: Secondary | ICD-10-CM | POA: Diagnosis not present

## 2013-06-29 ENCOUNTER — Encounter: Payer: Self-pay | Admitting: Gastroenterology

## 2013-08-15 ENCOUNTER — Encounter: Payer: Self-pay | Admitting: Gastroenterology

## 2013-09-19 ENCOUNTER — Ambulatory Visit (AMBULATORY_SURGERY_CENTER): Payer: Self-pay

## 2013-09-19 VITALS — Ht 67.0 in | Wt 185.8 lb

## 2013-09-19 DIAGNOSIS — Z86010 Personal history of colonic polyps: Secondary | ICD-10-CM

## 2013-09-19 MED ORDER — NA SULFATE-K SULFATE-MG SULF 17.5-3.13-1.6 GM/177ML PO SOLN: 1.0000 | Freq: Once | ORAL | Status: DC

## 2013-10-03 ENCOUNTER — Encounter: Payer: Self-pay | Admitting: Gastroenterology

## 2013-10-03 ENCOUNTER — Ambulatory Visit (AMBULATORY_SURGERY_CENTER): Payer: Medicare Other | Admitting: Gastroenterology

## 2013-10-03 VITALS — BP 156/71 | HR 70 | Temp 98.1°F | Resp 17 | Ht 67.0 in | Wt 185.0 lb

## 2013-10-03 DIAGNOSIS — I739 Peripheral vascular disease, unspecified: Secondary | ICD-10-CM | POA: Diagnosis not present

## 2013-10-03 DIAGNOSIS — K5730 Diverticulosis of large intestine without perforation or abscess without bleeding: Secondary | ICD-10-CM

## 2013-10-03 DIAGNOSIS — Z86010 Personal history of colonic polyps: Secondary | ICD-10-CM | POA: Diagnosis not present

## 2013-10-03 DIAGNOSIS — D126 Benign neoplasm of colon, unspecified: Secondary | ICD-10-CM

## 2013-10-03 DIAGNOSIS — K648 Other hemorrhoids: Secondary | ICD-10-CM

## 2013-10-03 DIAGNOSIS — I10 Essential (primary) hypertension: Secondary | ICD-10-CM | POA: Diagnosis not present

## 2013-10-03 DIAGNOSIS — I2510 Atherosclerotic heart disease of native coronary artery without angina pectoris: Secondary | ICD-10-CM | POA: Diagnosis not present

## 2013-10-03 MED ORDER — SODIUM CHLORIDE 0.9 % IV SOLN: 500.0000 mL | INTRAVENOUS | Status: DC

## 2013-10-03 NOTE — Progress Notes (Signed)
Patient did not experience any of the following events: a burn prior to discharge; a fall within the facility; wrong site/side/patient/procedure/implant event; or a hospital transfer or hospital admission upon discharge from the facility. (G8907) Patient did not have preoperative order for IV antibiotic SSI prophylaxis. (G8918)  

## 2013-10-03 NOTE — Op Note (Signed)
[facility] [address] [place], [address]   COLONOSCOPY PROCEDURE REPORT  PATIENT: Ryan [name], Ryan [name]  MR#: [id number] BIRTHDATE: 1941-09-07 , 72  yrs. old GENDER: Male ENDOSCOPIST: Louis Meckel, MD REFERRED BY: PROCEDURE DATE:  10/03/2013 PROCEDURE:   Colonoscopy with cold biopsy polypectomy First Screening Colonoscopy - Avg.  risk and is 50 yrs.  old or older - No.  Prior Negative Screening - Now for repeat screening. N/A  History of Adenoma - Now for follow-up colonoscopy & has been > or = to 3 yrs.  Yes hx of adenoma.  Has been 3 or more years since last colonoscopy.  Polyps Removed Today? Yes. ASA CLASS:   Class II INDICATIONS:Patient's personal history of adenomatous colon polyps. 2003, 2009 MEDICATIONS: MAC sedation, administered by CRNA and propofol (Diprivan) 200mg  IV  DESCRIPTION OF PROCEDURE:   After the risks benefits and alternatives of the procedure were thoroughly explained, informed consent was obtained.  A digital rectal exam revealed no abnormalities of the rectum.   The LB [id number]  endoscope was introduced through the anus and advanced to the cecum, which was identified by both the appendix and ileocecal valve. No adverse events experienced.   The quality of the prep was Suprep fair  The instrument was then slowly withdrawn as the colon was fully examined.      COLON FINDINGS: A sessile polyp measuring 2 mm in size was found in the distal transverse colon.  A polypectomy was performed with cold forceps.   There was moderate diverticulosis noted in the sigmoid colon with associated muscular hypertrophy.   Internal hemorrhoids were found.   The colon mucosa was otherwise normal.  Retroflexed views revealed no abnormalities. The time to cecum=2 minutes 58 seconds.  Withdrawal time=10 minutes 04 seconds.  The scope was withdrawn and the procedure completed. COMPLICATIONS: There were no complications.  ENDOSCOPIC  IMPRESSION: 1.   Sessile polyp measuring 2 mm in size was found in the distal transverse colon; polypectomy was performed with cold forceps 2.   There was moderate diverticulosis noted in the sigmoid colon 3.   Internal hemorrhoids 4.   The colon mucosa was otherwise normal  RECOMMENDATIONS: If the polyp(s) removed today are proven to be adenomatous (pre-cancerous) polyps, you will need a repeat colonoscopy in 5 years.  Otherwise we can discontinue routine surveillance colonoscopy, which usually ends around age 64.  You will receive a letter within 1-2 weeks with the results of your biopsy as well as final recommendations.  Please call my office if you have not received a letter after 3 weeks.   eSigned:  Louis Meckel, MD 10/03/2013 8:40 AM   cc:   PATIENT NAME:  Ryan [name], Ryan [name] MR#: [id number]

## 2013-10-03 NOTE — Patient Instructions (Signed)
Discharge instructions given with verbal understanding. Handouts on polyps,diverticulosis and hemorrhoids given. Resume previous medications. YOU HAD AN ENDOSCOPIC PROCEDURE TODAY AT THE  ENDOSCOPY CENTER: Refer to the procedure report that was given to you for any specific questions about what was found during the examination.  If the procedure report does not answer your questions, please call your gastroenterologist to clarify.  If you requested that your care partner not be given the details of your procedure findings, then the procedure report has been included in a sealed envelope for you to review at your convenience later.  YOU SHOULD EXPECT: Some feelings of bloating in the abdomen. Passage of more gas than usual.  Walking can help get rid of the air that was put into your GI tract during the procedure and reduce the bloating. If you had a lower endoscopy (such as a colonoscopy or flexible sigmoidoscopy) you may notice spotting of blood in your stool or on the toilet paper. If you underwent a bowel prep for your procedure, then you may not have a normal bowel movement for a few days.  DIET: Your first meal following the procedure should be a light meal and then it is ok to progress to your normal diet.  A half-sandwich or bowl of soup is an example of a good first meal.  Heavy or fried foods are harder to digest and may make you feel nauseous or bloated.  Likewise meals heavy in dairy and vegetables can cause extra gas to form and this can also increase the bloating.  Drink plenty of fluids but you should avoid alcoholic beverages for 24 hours.  ACTIVITY: Your care partner should take you home directly after the procedure.  You should plan to take it easy, moving slowly for the rest of the day.  You can resume normal activity the day after the procedure however you should NOT DRIVE or use heavy machinery for 24 hours (because of the sedation medicines used during the test).    SYMPTOMS TO  REPORT IMMEDIATELY: A gastroenterologist can be reached at any hour.  During normal business hours, 8:30 AM to 5:00 PM Monday through Friday, call [phone].  After hours and on weekends, please call the GI answering service at [phone] who will take a message and have the physician on call contact you.   Following lower endoscopy (colonoscopy or flexible sigmoidoscopy):  Excessive amounts of blood in the stool  Significant tenderness or worsening of abdominal pains  Swelling of the abdomen that is new, acute  Fever of 100F or higher  FOLLOW UP: If any biopsies were taken you will be contacted by phone or by letter within the next 1-3 weeks.  Call your gastroenterologist if you have not heard about the biopsies in 3 weeks.  Our staff will call the home number listed on your records the next business day following your procedure to check on you and address any questions or concerns that you may have at that time regarding the information given to you following your procedure. This is a courtesy call and so if there is no answer at the home number and we have not heard from you through the emergency physician on call, we will assume that you have returned to your regular daily activities without incident.  SIGNATURES/CONFIDENTIALITY: You and/or your care partner have signed paperwork which will be entered into your electronic medical record.  These signatures attest to the fact that that the information above on your After Visit   Summary has been reviewed and is understood.  Full responsibility of the confidentiality of this discharge information lies with you and/or your care-partner.  

## 2013-10-03 NOTE — Progress Notes (Signed)
Called to room to assist during endoscopic procedure.  Patient ID and intended procedure confirmed with present staff. Received instructions for my participation in the procedure from the performing physician.  

## 2013-10-04 ENCOUNTER — Telehealth: Payer: Self-pay

## 2013-10-04 NOTE — Telephone Encounter (Signed)
Left message on answering machine. 

## 2013-10-06 ENCOUNTER — Other Ambulatory Visit (HOSPITAL_COMMUNITY): Payer: Self-pay | Admitting: Vascular Surgery

## 2013-10-06 ENCOUNTER — Other Ambulatory Visit: Payer: Self-pay | Admitting: Vascular Surgery

## 2013-10-06 DIAGNOSIS — I70219 Atherosclerosis of native arteries of extremities with intermittent claudication, unspecified extremity: Secondary | ICD-10-CM

## 2013-10-06 DIAGNOSIS — I739 Peripheral vascular disease, unspecified: Secondary | ICD-10-CM

## 2013-10-06 DIAGNOSIS — Z48812 Encounter for surgical aftercare following surgery on the circulatory system: Secondary | ICD-10-CM

## 2013-10-10 ENCOUNTER — Encounter: Payer: Self-pay | Admitting: Gastroenterology

## 2013-10-13 DIAGNOSIS — Z23 Encounter for immunization: Secondary | ICD-10-CM | POA: Diagnosis not present

## 2013-10-23 ENCOUNTER — Encounter: Payer: Self-pay | Admitting: Family

## 2013-10-24 ENCOUNTER — Other Ambulatory Visit (HOSPITAL_COMMUNITY): Payer: Medicare Other

## 2013-10-24 ENCOUNTER — Inpatient Hospital Stay (HOSPITAL_COMMUNITY): Admission: RE | Admit: 2013-10-24 | Payer: Medicare Other | Source: Ambulatory Visit

## 2013-10-24 ENCOUNTER — Ambulatory Visit: Payer: Self-pay | Admitting: Family

## 2013-10-24 ENCOUNTER — Encounter (HOSPITAL_COMMUNITY): Payer: Medicare Other

## 2013-12-06 ENCOUNTER — Other Ambulatory Visit (HOSPITAL_COMMUNITY): Payer: Self-pay | Admitting: Internal Medicine

## 2013-12-06 DIAGNOSIS — R972 Elevated prostate specific antigen [PSA]: Secondary | ICD-10-CM | POA: Diagnosis not present

## 2013-12-06 DIAGNOSIS — E785 Hyperlipidemia, unspecified: Secondary | ICD-10-CM | POA: Diagnosis not present

## 2013-12-06 DIAGNOSIS — R7301 Impaired fasting glucose: Secondary | ICD-10-CM | POA: Diagnosis not present

## 2013-12-06 DIAGNOSIS — R0989 Other specified symptoms and signs involving the circulatory and respiratory systems: Secondary | ICD-10-CM

## 2013-12-06 DIAGNOSIS — I2510 Atherosclerotic heart disease of native coronary artery without angina pectoris: Secondary | ICD-10-CM | POA: Diagnosis not present

## 2013-12-06 DIAGNOSIS — M25559 Pain in unspecified hip: Secondary | ICD-10-CM | POA: Diagnosis not present

## 2013-12-06 DIAGNOSIS — I10 Essential (primary) hypertension: Secondary | ICD-10-CM | POA: Diagnosis not present

## 2013-12-06 DIAGNOSIS — R0609 Other forms of dyspnea: Secondary | ICD-10-CM

## 2013-12-06 DIAGNOSIS — E039 Hypothyroidism, unspecified: Secondary | ICD-10-CM | POA: Diagnosis not present

## 2013-12-18 ENCOUNTER — Other Ambulatory Visit (HOSPITAL_COMMUNITY): Payer: Self-pay | Admitting: Orthopaedic Surgery

## 2013-12-18 DIAGNOSIS — M169 Osteoarthritis of hip, unspecified: Secondary | ICD-10-CM | POA: Diagnosis not present

## 2013-12-18 DIAGNOSIS — M76899 Other specified enthesopathies of unspecified lower limb, excluding foot: Secondary | ICD-10-CM | POA: Diagnosis not present

## 2013-12-18 DIAGNOSIS — M25559 Pain in unspecified hip: Secondary | ICD-10-CM | POA: Diagnosis not present

## 2013-12-28 ENCOUNTER — Encounter (HOSPITAL_COMMUNITY): Payer: Self-pay | Admitting: Pharmacy Technician

## 2014-01-01 ENCOUNTER — Encounter (HOSPITAL_COMMUNITY): Payer: Medicare Other

## 2014-01-02 ENCOUNTER — Encounter (HOSPITAL_COMMUNITY)
Admission: RE | Admit: 2014-01-02 | Discharge: 2014-01-02 | Disposition: A | Discharge status: Home / Self Care | Payer: Medicare Other | Source: Ambulatory Visit | Attending: Orthopaedic Surgery | Admitting: Orthopaedic Surgery

## 2014-01-02 ENCOUNTER — Encounter (HOSPITAL_COMMUNITY): Payer: Self-pay

## 2014-01-02 ENCOUNTER — Encounter (HOSPITAL_COMMUNITY)
Admission: RE | Admit: 2014-01-02 | Discharge: 2014-01-02 | Disposition: A | Discharge status: Home / Self Care | Payer: Medicare Other | Source: Ambulatory Visit | Attending: Anesthesiology | Admitting: Anesthesiology

## 2014-01-02 DIAGNOSIS — Z01812 Encounter for preprocedural laboratory examination: Secondary | ICD-10-CM | POA: Insufficient documentation

## 2014-01-02 DIAGNOSIS — Z01811 Encounter for preprocedural respiratory examination: Secondary | ICD-10-CM | POA: Diagnosis not present

## 2014-01-02 DIAGNOSIS — Z01818 Encounter for other preprocedural examination: Secondary | ICD-10-CM | POA: Diagnosis not present

## 2014-01-02 DIAGNOSIS — I10 Essential (primary) hypertension: Secondary | ICD-10-CM | POA: Diagnosis not present

## 2014-01-02 DIAGNOSIS — Z01810 Encounter for preprocedural cardiovascular examination: Secondary | ICD-10-CM | POA: Insufficient documentation

## 2014-01-02 HISTORY — DX: Gastro-esophageal reflux disease without esophagitis: K21.9

## 2014-01-02 LAB — CBC
HCT: 44.5 % (ref 39.0–52.0)
Hemoglobin: 15.5 g/dL (ref 13.0–17.0)
MCH: 32.8 pg (ref 26.0–34.0)
MCHC: 34.8 g/dL (ref 30.0–36.0)
MCV: 94.3 fL (ref 78.0–100.0)
Platelets: 289 10*3/uL (ref 150–400)
RBC: 4.72 MIL/uL (ref 4.22–5.81)
RDW: 12.7 % (ref 11.5–15.5)
WBC: 11.2 10*3/uL — ABNORMAL HIGH (ref 4.0–10.5)

## 2014-01-02 LAB — URINALYSIS, ROUTINE W REFLEX MICROSCOPIC
Bilirubin Urine: NEGATIVE
Glucose, UA: NEGATIVE mg/dL
Hgb urine dipstick: NEGATIVE
KETONES UR: NEGATIVE mg/dL
LEUKOCYTES UA: NEGATIVE
Nitrite: NEGATIVE
PROTEIN: NEGATIVE mg/dL
Specific Gravity, Urine: 1.025 (ref 1.005–1.030)
Urobilinogen, UA: 1 mg/dL (ref 0.0–1.0)
pH: 5.5 (ref 5.0–8.0)

## 2014-01-02 LAB — BASIC METABOLIC PANEL
BUN: 18 mg/dL (ref 6–23)
CALCIUM: 9.4 mg/dL (ref 8.4–10.5)
CO2: 27 mEq/L (ref 19–32)
Chloride: 104 mEq/L (ref 96–112)
Creatinine, Ser: 1.02 mg/dL (ref 0.50–1.35)
GFR, EST AFRICAN AMERICAN: 83 mL/min — AB (ref >90)
GFR, EST NON AFRICAN AMERICAN: 71 mL/min — AB (ref >90)
Glucose, Bld: 120 mg/dL — ABNORMAL HIGH (ref 70–99)
Potassium: 5.4 mEq/L — ABNORMAL HIGH (ref 3.7–5.3)
Sodium: 143 mEq/L (ref 137–147)

## 2014-01-02 LAB — SURGICAL PCR SCREEN
MRSA, PCR: NEGATIVE
STAPHYLOCOCCUS AUREUS: NEGATIVE

## 2014-01-02 NOTE — Progress Notes (Signed)
Patient has cardiac hx.  Sees Dr. Acie Fredrickson ([place] 05/29/2013-note in chart.)  Patient denies any chest pain, angina. Had heart cath 2010, Fem-Pop BPG.  Cardiolite study in 08/2012.  States he feels great...Marland KitchenDA

## 2014-01-02 NOTE — Pre-Procedure Instructions (Signed)
CURTISS MAHMOOD  01/02/2014   Your procedure is scheduled on:  Tuesday, Jan. 20th             [facility] parking is available)   Report to [place]  2 * 3 at 10:00 AM.   Call this number if you have problems the morning of surgery: [phone]   Remember:   Do not eat food or drink liquids after midnight Monday.   Take these medicines the morning of surgery with A SIP OF WATER: Amlodipine, Gabapentin, Levothyroxine, metoprolol   Do not wear jewelry.  Do not wear lotions, powders, or colognes. You may wear deodorant.   Men may shave face and neck.   Do not bring valuables to the hospital.  [facility] is not responsible for any belongings or valuables.               Contacts, dentures or bridgework may not be worn into surgery.  Leave suitcase in the car. After surgery it may be brought to your room.  For patients admitted to the hospital, discharge time is determined by your treatment team.    Name and phone number of your driver:                Special Instructions: Shower using CHG 2 nights before surgery and the night before surgery.  If you shower the day of surgery use CHG.  Use special wash - you have one bottle of CHG for all showers.  You should use approximately 1/3 of the bottle for each shower.   Please read over the following fact sheets that you were given: Pain Booklet, Blood Transfusion Information, MRSA Information and Surgical Site Infection Prevention

## 2014-01-03 LAB — TYPE AND SCREEN
ABO/RH(D): A POS
ANTIBODY SCREEN: NEGATIVE

## 2014-01-03 LAB — ABO/RH: ABO/RH(D): A POS

## 2014-01-08 MED ORDER — CEFAZOLIN SODIUM-DEXTROSE 2-3 GM-% IV SOLR
2.0000 g | INTRAVENOUS | Status: AC
Start: 2014-01-09 — End: 2014-01-09
  Administered 2014-01-09: 2 g via INTRAVENOUS
  Filled 2014-01-08: qty 50

## 2014-01-09 ENCOUNTER — Inpatient Hospital Stay (HOSPITAL_COMMUNITY): Payer: Medicare Other

## 2014-01-09 ENCOUNTER — Encounter (HOSPITAL_COMMUNITY)
Admission: RE | Disposition: A | Discharge status: Home Health | Payer: Self-pay | Source: Ambulatory Visit | Attending: Orthopaedic Surgery

## 2014-01-09 ENCOUNTER — Inpatient Hospital Stay (HOSPITAL_COMMUNITY): Payer: Medicare Other | Admitting: Certified Registered Nurse Anesthetist

## 2014-01-09 ENCOUNTER — Inpatient Hospital Stay (HOSPITAL_COMMUNITY)
Admission: RE | Admit: 2014-01-09 | Discharge: 2014-01-12 | DRG: 470 | Disposition: A | Discharge status: Home Health | Payer: Medicare Other | Source: Ambulatory Visit | Attending: Orthopaedic Surgery | Admitting: Orthopaedic Surgery

## 2014-01-09 ENCOUNTER — Encounter (HOSPITAL_COMMUNITY): Payer: Self-pay | Admitting: Certified Registered Nurse Anesthetist

## 2014-01-09 ENCOUNTER — Encounter (HOSPITAL_COMMUNITY): Payer: Medicare Other | Admitting: Certified Registered Nurse Anesthetist

## 2014-01-09 DIAGNOSIS — Z7982 Long term (current) use of aspirin: Secondary | ICD-10-CM | POA: Diagnosis not present

## 2014-01-09 DIAGNOSIS — Z79899 Other long term (current) drug therapy: Secondary | ICD-10-CM

## 2014-01-09 DIAGNOSIS — M1610 Unilateral primary osteoarthritis, unspecified hip: Secondary | ICD-10-CM | POA: Diagnosis not present

## 2014-01-09 DIAGNOSIS — I252 Old myocardial infarction: Secondary | ICD-10-CM | POA: Diagnosis not present

## 2014-01-09 DIAGNOSIS — E785 Hyperlipidemia, unspecified: Secondary | ICD-10-CM | POA: Diagnosis present

## 2014-01-09 DIAGNOSIS — M1612 Unilateral primary osteoarthritis, left hip: Secondary | ICD-10-CM

## 2014-01-09 DIAGNOSIS — M25559 Pain in unspecified hip: Secondary | ICD-10-CM | POA: Diagnosis not present

## 2014-01-09 DIAGNOSIS — M76899 Other specified enthesopathies of unspecified lower limb, excluding foot: Secondary | ICD-10-CM | POA: Diagnosis not present

## 2014-01-09 DIAGNOSIS — E0590 Thyrotoxicosis, unspecified without thyrotoxic crisis or storm: Secondary | ICD-10-CM | POA: Diagnosis present

## 2014-01-09 DIAGNOSIS — M169 Osteoarthritis of hip, unspecified: Principal | ICD-10-CM | POA: Diagnosis present

## 2014-01-09 DIAGNOSIS — M549 Dorsalgia, unspecified: Secondary | ICD-10-CM | POA: Diagnosis not present

## 2014-01-09 DIAGNOSIS — Z96649 Presence of unspecified artificial hip joint: Secondary | ICD-10-CM

## 2014-01-09 DIAGNOSIS — Z471 Aftercare following joint replacement surgery: Secondary | ICD-10-CM | POA: Diagnosis not present

## 2014-01-09 DIAGNOSIS — I10 Essential (primary) hypertension: Secondary | ICD-10-CM | POA: Diagnosis present

## 2014-01-09 DIAGNOSIS — K219 Gastro-esophageal reflux disease without esophagitis: Secondary | ICD-10-CM | POA: Diagnosis not present

## 2014-01-09 HISTORY — PX: TOTAL HIP ARTHROPLASTY: SHX124

## 2014-01-09 HISTORY — PX: STERIOD INJECTION: SHX5046

## 2014-01-09 SURGERY — ARTHROPLASTY, HIP, TOTAL, ANTERIOR APPROACH
Anesthesia: General | Site: Hip | Laterality: Right

## 2014-01-09 MED ORDER — GLYCOPYRROLATE 0.2 MG/ML IJ SOLN
INTRAMUSCULAR | Status: DC | PRN
  Administered 2014-01-09: .8 mg via INTRAVENOUS

## 2014-01-09 MED ORDER — DICLOFENAC SODIUM 1 % TD GEL
4.0000 g | Freq: Every day | TRANSDERMAL | Status: DC
  Administered 2014-01-09: 4 g via TOPICAL
  Filled 2014-01-09 (×3): qty 100

## 2014-01-09 MED ORDER — ACETAMINOPHEN 650 MG RE SUPP: 650.0000 mg | Freq: Four times a day (QID) | RECTAL | Status: DC | PRN

## 2014-01-09 MED ORDER — ONDANSETRON HCL 4 MG/2ML IJ SOLN
INTRAMUSCULAR | Status: DC | PRN
  Administered 2014-01-09: 4 mg via INTRAVENOUS

## 2014-01-09 MED ORDER — SODIUM CHLORIDE 0.9 % IR SOLN
Status: DC | PRN
  Administered 2014-01-09: 3000 mL

## 2014-01-09 MED ORDER — HYDROMORPHONE HCL PF 1 MG/ML IJ SOLN
INTRAMUSCULAR | Status: AC
  Filled 2014-01-09: qty 1

## 2014-01-09 MED ORDER — METOCLOPRAMIDE HCL 5 MG/ML IJ SOLN: 5.0000 mg | Freq: Three times a day (TID) | INTRAMUSCULAR | Status: DC | PRN

## 2014-01-09 MED ORDER — PROPOFOL 10 MG/ML IV BOLUS
INTRAVENOUS | Status: DC | PRN
  Administered 2014-01-09: 200 mg via INTRAVENOUS

## 2014-01-09 MED ORDER — ASPIRIN EC 325 MG PO TBEC
325.0000 mg | DELAYED_RELEASE_TABLET | Freq: Two times a day (BID) | ORAL | Status: DC
  Administered 2014-01-10 – 2014-01-12 (×5): 325 mg via ORAL
  Filled 2014-01-09 (×7): qty 1

## 2014-01-09 MED ORDER — ALUM & MAG HYDROXIDE-SIMETH 200-200-20 MG/5ML PO SUSP
30.0000 mL | ORAL | Status: DC | PRN
  Administered 2014-01-10: 30 mL via ORAL
  Filled 2014-01-09: qty 30

## 2014-01-09 MED ORDER — HYDROMORPHONE HCL PF 1 MG/ML IJ SOLN: 1.0000 mg | INTRAMUSCULAR | Status: DC | PRN

## 2014-01-09 MED ORDER — PHENOL 1.4 % MT LIQD: 1.0000 | OROMUCOSAL | Status: DC | PRN

## 2014-01-09 MED ORDER — ACETAMINOPHEN 325 MG PO TABS: 650.0000 mg | ORAL_TABLET | Freq: Four times a day (QID) | ORAL | Status: DC | PRN

## 2014-01-09 MED ORDER — METHYLPREDNISOLONE ACETATE 40 MG/ML IJ SUSP
INTRAMUSCULAR | Status: AC
  Filled 2014-01-09: qty 1

## 2014-01-09 MED ORDER — ONDANSETRON HCL 4 MG/2ML IJ SOLN: 4.0000 mg | Freq: Once | INTRAMUSCULAR | Status: DC | PRN

## 2014-01-09 MED ORDER — SODIUM CHLORIDE 0.9 % IV SOLN
INTRAVENOUS | Status: DC | PRN
  Administered 2014-01-09: 15:00:00 via INTRAVENOUS

## 2014-01-09 MED ORDER — GABAPENTIN 100 MG PO CAPS: 100.0000 mg | ORAL_CAPSULE | Freq: Three times a day (TID) | ORAL | Status: DC

## 2014-01-09 MED ORDER — ONDANSETRON HCL 4 MG/2ML IJ SOLN
4.0000 mg | Freq: Four times a day (QID) | INTRAMUSCULAR | Status: DC | PRN
  Administered 2014-01-10: 4 mg via INTRAVENOUS
  Filled 2014-01-09: qty 2

## 2014-01-09 MED ORDER — MENTHOL 3 MG MT LOZG: 1.0000 | LOZENGE | OROMUCOSAL | Status: DC | PRN

## 2014-01-09 MED ORDER — HYDROMORPHONE HCL PF 1 MG/ML IJ SOLN
0.2500 mg | INTRAMUSCULAR | Status: DC | PRN
  Administered 2014-01-09 (×4): 0.5 mg via INTRAVENOUS

## 2014-01-09 MED ORDER — LACTATED RINGERS IV SOLN
INTRAVENOUS | Status: DC
  Administered 2014-01-09: 11:00:00 via INTRAVENOUS

## 2014-01-09 MED ORDER — GABAPENTIN 600 MG PO TABS
300.0000 mg | ORAL_TABLET | Freq: Every day | ORAL | Status: DC
  Filled 2014-01-09: qty 0.5

## 2014-01-09 MED ORDER — GABAPENTIN 300 MG PO CAPS
300.0000 mg | ORAL_CAPSULE | Freq: Every day | ORAL | Status: DC
  Administered 2014-01-09 – 2014-01-11 (×3): 300 mg via ORAL
  Filled 2014-01-09 (×5): qty 1

## 2014-01-09 MED ORDER — SODIUM CHLORIDE 0.9 % IV SOLN
INTRAVENOUS | Status: DC
  Administered 2014-01-09 – 2014-01-10 (×2): via INTRAVENOUS

## 2014-01-09 MED ORDER — VECURONIUM BROMIDE 10 MG IV SOLR
INTRAVENOUS | Status: DC | PRN
  Administered 2014-01-09: 2 mg via INTRAVENOUS

## 2014-01-09 MED ORDER — ROCURONIUM BROMIDE 100 MG/10ML IV SOLN
INTRAVENOUS | Status: DC | PRN
  Administered 2014-01-09: 50 mg via INTRAVENOUS

## 2014-01-09 MED ORDER — PHENYLEPHRINE HCL 10 MG/ML IJ SOLN
INTRAMUSCULAR | Status: DC | PRN
  Administered 2014-01-09 (×2): 80 ug via INTRAVENOUS

## 2014-01-09 MED ORDER — AMLODIPINE BESYLATE 5 MG PO TABS
5.0000 mg | ORAL_TABLET | Freq: Every day | ORAL | Status: DC
  Administered 2014-01-09 – 2014-01-11 (×3): 5 mg via ORAL
  Filled 2014-01-09 (×4): qty 1

## 2014-01-09 MED ORDER — DOCUSATE SODIUM 100 MG PO CAPS
100.0000 mg | ORAL_CAPSULE | Freq: Two times a day (BID) | ORAL | Status: DC
  Administered 2014-01-09 – 2014-01-12 (×6): 100 mg via ORAL
  Filled 2014-01-09 (×7): qty 1

## 2014-01-09 MED ORDER — NEOSTIGMINE METHYLSULFATE 1 MG/ML IJ SOLN
INTRAMUSCULAR | Status: DC | PRN
  Administered 2014-01-09: 5 mg via INTRAVENOUS

## 2014-01-09 MED ORDER — ESMOLOL HCL 10 MG/ML IV SOLN
INTRAVENOUS | Status: DC | PRN
  Administered 2014-01-09: 20 mg via INTRAVENOUS

## 2014-01-09 MED ORDER — CEFAZOLIN SODIUM 1-5 GM-% IV SOLN
1.0000 g | Freq: Four times a day (QID) | INTRAVENOUS | Status: AC
  Administered 2014-01-09 – 2014-01-10 (×2): 1 g via INTRAVENOUS
  Filled 2014-01-09 (×2): qty 50

## 2014-01-09 MED ORDER — ATORVASTATIN CALCIUM 80 MG PO TABS
80.0000 mg | ORAL_TABLET | Freq: Every day | ORAL | Status: DC
  Administered 2014-01-09 – 2014-01-11 (×3): 80 mg via ORAL
  Filled 2014-01-09 (×4): qty 1

## 2014-01-09 MED ORDER — LEVOTHYROXINE SODIUM 75 MCG PO TABS
75.0000 ug | ORAL_TABLET | Freq: Every day | ORAL | Status: DC
Start: 2014-01-10 — End: 2014-01-12
  Administered 2014-01-10 – 2014-01-12 (×3): 75 ug via ORAL
  Filled 2014-01-09 (×4): qty 1

## 2014-01-09 MED ORDER — METHOCARBAMOL 100 MG/ML IJ SOLN
500.0000 mg | Freq: Four times a day (QID) | INTRAMUSCULAR | Status: DC | PRN
  Filled 2014-01-09: qty 5

## 2014-01-09 MED ORDER — METHOCARBAMOL 500 MG PO TABS
ORAL_TABLET | ORAL | Status: AC
  Filled 2014-01-09: qty 1

## 2014-01-09 MED ORDER — TRAMADOL HCL 50 MG PO TABS
100.0000 mg | ORAL_TABLET | Freq: Four times a day (QID) | ORAL | Status: DC | PRN
  Administered 2014-01-09 – 2014-01-12 (×2): 100 mg via ORAL
  Filled 2014-01-09 (×2): qty 2

## 2014-01-09 MED ORDER — GABAPENTIN 100 MG PO CAPS
100.0000 mg | ORAL_CAPSULE | Freq: Two times a day (BID) | ORAL | Status: DC
  Administered 2014-01-10 – 2014-01-12 (×5): 100 mg via ORAL
  Filled 2014-01-09 (×8): qty 1

## 2014-01-09 MED ORDER — OXYCODONE HCL 5 MG PO TABS
ORAL_TABLET | ORAL | Status: AC
  Administered 2014-01-09: 5 mg
  Filled 2014-01-09: qty 1

## 2014-01-09 MED ORDER — DEXAMETHASONE SODIUM PHOSPHATE 4 MG/ML IJ SOLN
INTRAMUSCULAR | Status: DC | PRN
  Administered 2014-01-09: 4 mg via INTRAVENOUS

## 2014-01-09 MED ORDER — OXYCODONE HCL ER 10 MG PO T12A
10.0000 mg | EXTENDED_RELEASE_TABLET | Freq: Two times a day (BID) | ORAL | Status: DC
  Administered 2014-01-09 – 2014-01-11 (×4): 10 mg via ORAL
  Filled 2014-01-09 (×4): qty 1

## 2014-01-09 MED ORDER — BUPIVACAINE HCL (PF) 0.25 % IJ SOLN
INTRAMUSCULAR | Status: AC
  Filled 2014-01-09: qty 30

## 2014-01-09 MED ORDER — POLYETHYLENE GLYCOL 3350 17 G PO PACK: 17.0000 g | PACK | Freq: Every day | ORAL | Status: DC | PRN

## 2014-01-09 MED ORDER — EPHEDRINE SULFATE 50 MG/ML IJ SOLN
INTRAMUSCULAR | Status: DC | PRN
Start: 2014-01-09 — End: 2014-01-09
  Administered 2014-01-09: 10 mg via INTRAVENOUS

## 2014-01-09 MED ORDER — OXYCODONE HCL 5 MG PO TABS
5.0000 mg | ORAL_TABLET | ORAL | Status: DC | PRN
  Administered 2014-01-09: 5 mg via ORAL
  Administered 2014-01-10 (×4): 10 mg via ORAL
  Filled 2014-01-09 (×2): qty 2
  Filled 2014-01-09: qty 1
  Filled 2014-01-09 (×3): qty 2

## 2014-01-09 MED ORDER — METHOCARBAMOL 500 MG PO TABS
500.0000 mg | ORAL_TABLET | Freq: Four times a day (QID) | ORAL | Status: DC | PRN
  Administered 2014-01-09 – 2014-01-11 (×6): 500 mg via ORAL
  Filled 2014-01-09 (×6): qty 1

## 2014-01-09 MED ORDER — METOPROLOL TARTRATE 25 MG PO TABS
25.0000 mg | ORAL_TABLET | Freq: Two times a day (BID) | ORAL | Status: DC
  Administered 2014-01-09 – 2014-01-12 (×6): 25 mg via ORAL
  Filled 2014-01-09 (×7): qty 1

## 2014-01-09 MED ORDER — METOCLOPRAMIDE HCL 10 MG PO TABS: 5.0000 mg | ORAL_TABLET | Freq: Three times a day (TID) | ORAL | Status: DC | PRN

## 2014-01-09 MED ORDER — FENTANYL CITRATE 0.05 MG/ML IJ SOLN
INTRAMUSCULAR | Status: DC | PRN
  Administered 2014-01-09: 100 ug via INTRAVENOUS
  Administered 2014-01-09 (×2): 50 ug via INTRAVENOUS
  Administered 2014-01-09: 150 ug via INTRAVENOUS
  Administered 2014-01-09: 50 ug via INTRAVENOUS
  Administered 2014-01-09: 100 ug via INTRAVENOUS

## 2014-01-09 MED ORDER — DIPHENHYDRAMINE HCL 12.5 MG/5ML PO ELIX: 12.5000 mg | ORAL_SOLUTION | ORAL | Status: DC | PRN

## 2014-01-09 MED ORDER — ZOLPIDEM TARTRATE 5 MG PO TABS: 5.0000 mg | ORAL_TABLET | Freq: Every evening | ORAL | Status: DC | PRN

## 2014-01-09 MED ORDER — PHENYLEPHRINE HCL 10 MG/ML IJ SOLN
10.0000 mg | INTRAVENOUS | Status: DC | PRN
  Administered 2014-01-09: 50 ug/min via INTRAVENOUS

## 2014-01-09 MED ORDER — ONDANSETRON HCL 4 MG PO TABS
4.0000 mg | ORAL_TABLET | Freq: Four times a day (QID) | ORAL | Status: DC | PRN
  Administered 2014-01-11: 4 mg via ORAL
  Filled 2014-01-09: qty 1

## 2014-01-09 SURGICAL SUPPLY — 56 items
ADH SKN CLS LQ APL DERMABOND (GAUZE/BANDAGES/DRESSINGS) ×2
BANDAGE GAUZE ELAST BULKY 4 IN (GAUZE/BANDAGES/DRESSINGS) IMPLANT
BLADE SAW SGTL 18X1.27X75 (BLADE) ×3 IMPLANT
BLADE SURG ROTATE 9660 (MISCELLANEOUS) IMPLANT
CAPT HIP PF MOP ×1 IMPLANT
CELLS DAT CNTRL 66122 CELL SVR (MISCELLANEOUS) ×2 IMPLANT
CLOTH BEACON ORANGE TIMEOUT ST (SAFETY) ×3 IMPLANT
COVER BACK TABLE 24X17X13 BIG (DRAPES) IMPLANT
COVER SURGICAL LIGHT HANDLE (MISCELLANEOUS) ×3 IMPLANT
DERMABOND ADHESIVE PROPEN (GAUZE/BANDAGES/DRESSINGS) ×1
DERMABOND ADVANCED .7 DNX6 (GAUZE/BANDAGES/DRESSINGS) ×2 IMPLANT
DRAPE C-ARM 42X72 X-RAY (DRAPES) ×3 IMPLANT
DRAPE STERI IOBAN 125X83 (DRAPES) ×3 IMPLANT
DRAPE U-SHAPE 47X51 STRL (DRAPES) ×9 IMPLANT
DRSG AQUACEL AG ADV 3.5X10 (GAUZE/BANDAGES/DRESSINGS) ×3 IMPLANT
DURAPREP 26ML APPLICATOR (WOUND CARE) ×3 IMPLANT
ELECT BLADE 4.0 EZ CLEAN MEGAD (MISCELLANEOUS)
ELECT BLADE 6.5 EXT (BLADE) IMPLANT
ELECT BLADE TIP CTD 4 INCH (ELECTRODE) ×3 IMPLANT
ELECT CAUTERY BLADE 6.4 (BLADE) ×3 IMPLANT
ELECT REM PT RETURN 9FT ADLT (ELECTROSURGICAL) ×3
ELECTRODE BLDE 4.0 EZ CLN MEGD (MISCELLANEOUS) IMPLANT
ELECTRODE REM PT RTRN 9FT ADLT (ELECTROSURGICAL) ×2 IMPLANT
FACESHIELD LNG OPTICON STERILE (SAFETY) ×6 IMPLANT
GAUZE XEROFORM 1X8 LF (GAUZE/BANDAGES/DRESSINGS) ×3 IMPLANT
GLOVE BIOGEL PI IND STRL 8 (GLOVE) ×4 IMPLANT
GLOVE BIOGEL PI INDICATOR 8 (GLOVE) ×2
GLOVE ECLIPSE 8.0 STRL XLNG CF (GLOVE) ×3 IMPLANT
GLOVE ORTHO TXT STRL SZ7.5 (GLOVE) ×6 IMPLANT
GLOVE SURG ORTHO 8.0 STRL STRW (GLOVE) ×3 IMPLANT
GOWN STRL REIN XL XLG (GOWN DISPOSABLE) ×6 IMPLANT
HANDPIECE INTERPULSE COAX TIP (DISPOSABLE) ×3
KIT BASIN OR (CUSTOM PROCEDURE TRAY) ×3 IMPLANT
KIT ROOM TURNOVER OR (KITS) ×3 IMPLANT
MANIFOLD NEPTUNE II (INSTRUMENTS) ×3 IMPLANT
NS IRRIG 1000ML POUR BTL (IV SOLUTION) ×3 IMPLANT
PACK TOTAL JOINT (CUSTOM PROCEDURE TRAY) ×3 IMPLANT
PAD ARMBOARD 7.5X6 YLW CONV (MISCELLANEOUS) ×6 IMPLANT
RETRACTOR WND ALEXIS 18 MED (MISCELLANEOUS) ×2 IMPLANT
RTRCTR WOUND ALEXIS 18CM MED (MISCELLANEOUS) ×3
SET HNDPC FAN SPRY TIP SCT (DISPOSABLE) ×2 IMPLANT
SPONGE LAP 18X18 X RAY DECT (DISPOSABLE) IMPLANT
SPONGE LAP 4X18 X RAY DECT (DISPOSABLE) IMPLANT
STAPLER VISISTAT 35W (STAPLE) ×3 IMPLANT
SUT ETHIBOND NAB CT1 #1 30IN (SUTURE) ×6 IMPLANT
SUT MNCRL AB 3-0 PS2 27 (SUTURE) ×3 IMPLANT
SUT VIC AB 0 CT1 27 (SUTURE) ×6
SUT VIC AB 0 CT1 27XBRD ANBCTR (SUTURE) ×4 IMPLANT
SUT VIC AB 1 CT1 27 (SUTURE) ×6
SUT VIC AB 1 CT1 27XBRD ANBCTR (SUTURE) ×4 IMPLANT
SUT VIC AB 2-0 CT1 27 (SUTURE) ×6
SUT VIC AB 2-0 CT1 TAPERPNT 27 (SUTURE) ×4 IMPLANT
TOWEL OR 17X24 6PK STRL BLUE (TOWEL DISPOSABLE) ×3 IMPLANT
TOWEL OR 17X26 10 PK STRL BLUE (TOWEL DISPOSABLE) ×3 IMPLANT
TRAY FOLEY CATH 16FRSI W/METER (SET/KITS/TRAYS/PACK) IMPLANT
WATER STERILE IRR 1000ML POUR (IV SOLUTION) ×6 IMPLANT

## 2014-01-09 NOTE — Anesthesia Preprocedure Evaluation (Signed)
Anesthesia Evaluation  Patient identified by MRN, date of birth, ID band Patient awake    Reviewed: Allergy & Precautions, H&P , NPO status , Patient's Chart, lab work & pertinent test results  Airway       Dental   Pulmonary former smoker,          Cardiovascular hypertension, + CAD, + Past MI and + Peripheral Vascular Disease     Neuro/Psych    GI/Hepatic GERD-  ,  Endo/Other  Hyperthyroidism   Renal/GU      Musculoskeletal   Abdominal   Peds  Hematology   Anesthesia Other Findings   Reproductive/Obstetrics                           Anesthesia Physical Anesthesia Plan  ASA: III  Anesthesia Plan: General   Post-op Pain Management:    Induction: Intravenous  Airway Management Planned: Oral ETT  Additional Equipment:   Intra-op Plan:   Post-operative Plan: Extubation in OR  Informed Consent: I have reviewed the patients History and Physical, chart, labs and discussed the procedure including the risks, benefits and alternatives for the proposed anesthesia with the patient or authorized representative who has indicated his/her understanding and acceptance.     Plan Discussed with:   Anesthesia Plan Comments:         Anesthesia Quick Evaluation

## 2014-01-09 NOTE — Anesthesia Postprocedure Evaluation (Signed)
  Anesthesia Post-op Note  Patient: Ryan [name]  Procedure(s) Performed: Procedure(s): LEFT TOTAL HIP ARTHROPLASTY ANTERIOR APPROACH and Steroid Injection Right hip (Left) STEROID INJECTION (Right)  Patient Location: PACU  Anesthesia Type:General  Level of Consciousness: awake  Airway and Oxygen Therapy: Patient Spontanous Breathing  Post-op Pain: mild  Post-op Assessment: Post-op Vital signs reviewed, Patient's Cardiovascular Status Stable, Respiratory Function Stable, Patent Airway, No signs of Nausea or vomiting and Pain level controlled  Post-op Vital Signs: Reviewed and stable  Complications: No apparent anesthesia complications

## 2014-01-09 NOTE — Transfer of Care (Signed)
Immediate Anesthesia Transfer of Care Note  Patient: Ryan [name]  Procedure(s) Performed: Procedure(s): LEFT TOTAL HIP ARTHROPLASTY ANTERIOR APPROACH and Steroid Injection Right hip (Left) STEROID INJECTION (Right)  Patient Location: PACU  Anesthesia Type:General  Level of Consciousness: awake, alert  and oriented  Airway & Oxygen Therapy: Patient Spontanous Breathing and Patient connected to nasal cannula oxygen  Post-op Assessment: Report given to PACU RN and Post -op Vital signs reviewed and stable  Post vital signs: Reviewed and stable  Complications: No apparent anesthesia complications

## 2014-01-09 NOTE — Preoperative (Signed)
Beta Blockers   Reason not to administer Beta Blockers:Not Applicable 

## 2014-01-09 NOTE — Anesthesia Procedure Notes (Signed)
Procedure Name: Intubation Date/Time: 01/09/2014 1:23 PM Performed by: [place] Pink Pre-anesthesia Checklist: Patient identified, Timeout performed, Emergency Drugs available, Suction available and Patient being monitored Patient Re-evaluated:Patient Re-evaluated prior to inductionOxygen Delivery Method: Circle system utilized Preoxygenation: Pre-oxygenation with 100% oxygen Intubation Type: IV induction Ventilation: Mask ventilation without difficulty Laryngoscope Size: Mac and 3 Grade View: Grade I Tube type: Oral Tube size: 7.5 mm Number of attempts: 1 Airway Equipment and Method: Stylet Placement Confirmation: ETT inserted through vocal cords under direct vision,  positive ETCO2 and breath sounds checked- equal and bilateral Secured at: 22 cm Tube secured with: Tape Dental Injury: Teeth and Oropharynx as per pre-operative assessment

## 2014-01-09 NOTE — H&P (Signed)
TOTAL HIP ADMISSION H&P  Patient is admitted for left total hip arthroplasty.  Subjective:  Chief Complaint: left hip pain  HPI: Ryan [name], 73 y.o. male, has a history of pain and functional disability in the left hip(s) due to arthritis and patient has failed non-surgical conservative treatments for greater than 12 weeks to include NSAID's and/or analgesics, corticosteriod injections, use of assistive devices and activity modification.  Onset of symptoms was gradual starting 3 years ago with gradually worsening course since that time.The patient noted no past surgery on the left hip(s).  Patient currently rates pain in the left hip at 10 out of 10 with activity. Patient has night pain, worsening of pain with activity and weight bearing, trendelenberg gait, pain that interfers with activities of daily living and pain with passive range of motion. Patient has evidence of subchondral cysts, subchondral sclerosis, periarticular osteophytes and joint space narrowing by imaging studies. This condition presents safety issues increasing the risk of falls.  There is no current active infection.  Patient Active Problem List   Diagnosis Date Noted  . Arthritis of left hip 01/09/2014  . Spinal stenosis, lumbar 01/06/2013    Class: Diagnosis of  . Coronary artery disease 01/05/2013  . Atherosclerosis of native arteries of the extremities with intermittent claudication 10/18/2012   Past Medical History  Diagnosis Date  . Arthritis   . Myocardial infarction     56    dr Brigitte Pulse PCP          . Hyperlipidemia     takes Crestor daily  . Peripheral vascular disease   . Carotid artery occlusion   . Hypertension     takes Amlodipine daily  . Chronic back pain   . History of gout     has colchicine prn  . History of colon polyps   . Urinary frequency   . Urinary urgency   . Enlarged prostate     takes Rapaflo daily  . History of kidney stones   . Hyperthyroidism     takes SYnthroid daily as  instructed  . Cataract     Bil eyes/worse in left eye  . GERD (gastroesophageal reflux disease)     occasional    Past Surgical History  Procedure Laterality Date  . Femoral artery - popliteal artery bypass graft    . Joint replacement      shoulder  . Back surgery      5 times  . Lumbar laminectomy/decompression microdiscectomy  02/12/2012    Procedure: LUMBAR LAMINECTOMY/DECOMPRESSION MICRODISCECTOMY;  Surgeon: Floyce Stakes, MD;  Location: [facility];  Service: Neurosurgery;  Laterality: N/A;  Lumbar four-five laminectomy  . Appendectomy    . Cataract surgery      left eye  . Colonoscopy    . Big toe surgery    . Cystoscopy    . Lumbar laminectomy  01/06/2013    Procedure: MICRODISCECTOMY LUMBAR LAMINECTOMY;  Surgeon: Marybelle Killings, MD;  Location: [facility];  Service: Orthopedics;  Laterality: N/A;  L3-4 decompression  . Cardiac catheterization      2010    dr Acie Fredrickson  . Eye surgery      Prescriptions prior to admission  Medication Sig Dispense Refill  . amLODipine (NORVASC) 5 MG tablet Take 5 mg by mouth at bedtime.       Marland Kitchen aspirin EC 81 MG tablet Take 81 mg by mouth daily.      . diclofenac sodium (VOLTAREN) 1 % GEL Apply 2  g topically every 6 (six) hours as needed (for pain).      Marland Kitchen gabapentin (NEURONTIN) 100 MG capsule Take 100-300 mg by mouth 3 (three) times daily. Take 1 cap in the morning and afternoon. Take 3 caps in the evening      . INDOMETHACIN ER PO Take 75 mg by mouth as needed (for gout).       Marland Kitchen levothyroxine (SYNTHROID, LEVOTHROID) 75 MCG tablet Take 75 mcg by mouth daily.      . metoprolol tartrate (LOPRESSOR) 25 MG tablet Take 1 tablet (25 mg total) by mouth 2 (two) times daily.  60 tablet  6  . rosuvastatin (CRESTOR) 40 MG tablet Take 40 mg by mouth daily.      . silodosin (RAPAFLO) 8 MG CAPS capsule Take 8 mg by mouth daily with breakfast.       Allergies  Allergen Reactions  . Codeine Nausea And Vomiting         History  Substance Use Topics  .  Smoking status: Former Smoker    Types: Cigarettes    Quit date: 02/04/1987  . Smokeless tobacco: Former Systems developer    Types: Chew     Comment: quit 35+yrs ago  . Alcohol Use: No    Family History  Problem Relation Age of Onset  . Heart disease Father   . Heart disease Sister      Review of Systems  Musculoskeletal: Positive for joint pain.  All other systems reviewed and are negative.    Objective:  Physical Exam  Constitutional: He is oriented to person, place, and time. He appears well-developed and well-nourished.  HENT:  Head: Normocephalic and atraumatic.  Eyes: EOM are normal. Pupils are equal, round, and reactive to light.  Neck: Normal range of motion. Neck supple.  Cardiovascular: Normal rate and regular rhythm.   Respiratory: Effort normal and breath sounds normal.  GI: Soft. Bowel sounds are normal.  Musculoskeletal:       Right hip: He exhibits decreased range of motion, decreased strength and bony tenderness.       Left hip: He exhibits decreased range of motion, decreased strength and bony tenderness.  Neurological: He is alert and oriented to person, place, and time.  Skin: Skin is warm and dry.  Psychiatric: He has a normal mood and affect.    Vital signs in last 24 hours: Temp:  [98.1 F (36.7 C)] 98.1 F (36.7 C) (01/20 1026) Pulse Rate:  [82] 82 (01/20 1026) Resp:  [20] 20 (01/20 1026) BP: (160)/(78) 160/78 mmHg (01/20 1026) SpO2:  [97 %] 97 % (01/20 1026)  Labs:   Estimated body mass index is 28.97 kg/(m^2) as calculated from the following:   Height as of 01/02/14: 5\' 7"  (1.702 m).   Weight as of 10/03/13: 83.915 kg (185 lb).   Imaging Review Plain radiographs demonstrate severe degenerative joint disease of the left hip(s). The bone quality appears to be good for age and reported activity level.  Assessment/Plan:  End stage arthritis, left hip(s)  The patient history, physical examination, clinical judgement of the provider and imaging  studies are consistent with end stage degenerative joint disease of the left hip(s) and total hip arthroplasty is deemed medically necessary. The treatment options including medical management, injection therapy, arthroscopy and arthroplasty were discussed at length. The risks and benefits of total hip arthroplasty were presented and reviewed. The risks due to aseptic loosening, infection, stiffness, dislocation/subluxation,  thromboembolic complications and other imponderables were discussed.  The  patient acknowledged the explanation, agreed to proceed with the plan and consent was signed. Patient is being admitted for inpatient treatment for surgery, pain control, PT, OT, prophylactic antibiotics, VTE prophylaxis, progressive ambulation and ADL's and discharge planning.The patient is planning to be discharged home with home health services  He also has painful trochanteric bursitis in his right hip.  We will provide a steroid injection in that hip today.

## 2014-01-09 NOTE — Brief Op Note (Signed)
01/09/2014  2:50 PM  PATIENT:  Ryan [name]  73 y.o. male  PRE-OPERATIVE DIAGNOSIS:  osteoarthritis left hip, bursitis right hip  POST-OPERATIVE DIAGNOSIS:  osteoarthritis left hip, bursitis right hip  PROCEDURE:  Procedure(s): LEFT TOTAL HIP ARTHROPLASTY ANTERIOR APPROACH and Steroid Injection Right hip (Left) STEROID INJECTION (Right)  SURGEON:  Surgeon(s) and Role:    * Mcarthur Rossetti, MD - Primary  PHYSICIAN ASSISTANT: Benita Stabile, PA-C  ANESTHESIA:   general  EBL:  Total I/O In: 1000 [I.V.:1000] Out: 350 [Urine:150; Blood:200]  BLOOD ADMINISTERED:none  DRAINS: none   LOCAL MEDICATIONS USED:  NONE  SPECIMEN:  No Specimen  DISPOSITION OF SPECIMEN:  N/A  COUNTS:  YES  TOURNIQUET:  * No tourniquets in log *  DICTATION: .Other Dictation: Dictation Number [id number]  PLAN OF CARE: Admit to inpatient   PATIENT DISPOSITION:  PACU - hemodynamically stable.   Delay start of Pharmacological VTE agent (>24hrs) due to surgical blood loss or risk of bleeding: no

## 2014-01-09 NOTE — Progress Notes (Signed)
UR completed 

## 2014-01-09 NOTE — Progress Notes (Signed)
Pt. Admits that he has stopped a lot of his medicines, he states due to pain in his stomach & hips he has quit Metoprolol, crestor, gabapentin, synthroid, rapaflo in the past month. Pt. Has not told his MD- PCP. Pt. Reports that the medicine made him feel as "if I just couldn't go on".

## 2014-01-10 ENCOUNTER — Encounter (HOSPITAL_COMMUNITY): Payer: Self-pay | Admitting: General Practice

## 2014-01-10 LAB — CBC
HCT: 35 % — ABNORMAL LOW (ref 39.0–52.0)
Hemoglobin: 11.7 g/dL — ABNORMAL LOW (ref 13.0–17.0)
MCH: 31.7 pg (ref 26.0–34.0)
MCHC: 33.4 g/dL (ref 30.0–36.0)
MCV: 94.9 fL (ref 78.0–100.0)
PLATELETS: 234 10*3/uL (ref 150–400)
RBC: 3.69 MIL/uL — ABNORMAL LOW (ref 4.22–5.81)
RDW: 12.7 % (ref 11.5–15.5)
WBC: 16.2 10*3/uL — ABNORMAL HIGH (ref 4.0–10.5)

## 2014-01-10 LAB — BASIC METABOLIC PANEL
BUN: 19 mg/dL (ref 6–23)
CALCIUM: 8.2 mg/dL — AB (ref 8.4–10.5)
CO2: 23 mEq/L (ref 19–32)
Chloride: 104 mEq/L (ref 96–112)
Creatinine, Ser: 1.01 mg/dL (ref 0.50–1.35)
GFR calc Af Amer: 84 mL/min — ABNORMAL LOW (ref >90)
GFR, EST NON AFRICAN AMERICAN: 72 mL/min — AB (ref >90)
GLUCOSE: 141 mg/dL — AB (ref 70–99)
Potassium: 4.3 mEq/L (ref 3.7–5.3)
SODIUM: 140 meq/L (ref 137–147)

## 2014-01-10 MED ORDER — TAMSULOSIN HCL 0.4 MG PO CAPS
0.4000 mg | ORAL_CAPSULE | Freq: Every day | ORAL | Status: DC
  Administered 2014-01-10 – 2014-01-12 (×3): 0.4 mg via ORAL
  Filled 2014-01-10 (×4): qty 1

## 2014-01-10 NOTE — Op Note (Signed)
Ryan [name], Ryan [name] NO.:  [id number]  MEDICAL RECORD NO.:  [id number]  LOCATION:  [id number]                        FACILITY:  [facility]  PHYSICIAN:  Lind Guest. Ninfa Linden, M.D.DATE OF BIRTH:  1941-05-13  DATE OF PROCEDURE:  01/09/2014 DATE OF DISCHARGE:                              OPERATIVE REPORT   PREOPERATIVE DIAGNOSES: 1. Severe end-stage arthritis and degenerative joint disease, left     hip. 2. Trochanteric bursitis, right hip.  POSTOPERATIVE DIAGNOSES: 1. Severe end-stage arthritis and degenerative joint disease, left     hip. 2. Trochanteric bursitis, right hip.  PROCEDURES: 1. Left total hip arthroplasty through direct anterior approach. 2. Steroid injection into right hip greater trochanteric area.  IMPLANTS:  DePuy Sector Gription acetabular component, size 52, apex hole eliminator guide, size 36+ 4 neutral polyethylene liner, size 13 Corail femoral component with standard offset, size 36- 2 metal hip ball.  SURGEON:  Lind Guest. Ninfa Linden, M.D.  ASSISTANT:  Erskine Emery, PA-C.  ANESTHESIA:  General.  ANTIBIOTICS:  2 g of IV Ancef.  BLOOD LOSS:  200 mL.  COMPLICATIONS:  None.  INDICATIONS:  [name] is a very pleasant 73 year old active individual who is well known to me.  He has had progressively worsening left hip pain for some time now at least 3 years.  X-rays show complete loss of his joint space.  There are periarticular osteophytes and subchondral sclerosis and cystic changes, this is on the left hip.  He also has severe trochanter bursitis on the right hip.  At this point with failure of conservative treatment on the left side, he wished to proceed with a total hip arthroplasty, we advised to complete under general anesthesia.  He wishes first inject a steroid into the trochanteric bursa on the right hip.  We marked both hips.  The risks and benefits of surgery were explained to him including the risks of acute  blood loss anemia, nerve and vessel injury, fracture, infection, dislocation, and DVT.  He understands the goals are decreased pain, improved mobility, and overall improved quality of life.  PROCEDURE DESCRIPTION:  After informed consent was obtained, appropriate left and right hips were marked, he was brought to the operating room and general anesthesia was obtained while he was on the stretcher.  A Foley catheter was placed and then both feet had traction boots applied to them.  Next, before brought him over to the table, we called a time- out for the right hip and identified him as right hip and the right greater trochanteric area for a steroid injection.  We prepped this area with Betadine, alcohol and provided injection of mixture of 4 mL of 0.25% plain Marcaine mixed with 1 mL of Depo-Medrol.  We then placed a Foley catheter and had traction boots placed on both his feet.  He was next placed supine on the Hana fracture table with perineal post in place and both legs in inline skeletal traction, but no traction applied.  His left operative hip was then assessed under fluoroscopy. We prepped it with DuraPrep and sterile drapes.  Time-out was called to identify the correct patient and correct left hip for the total hip replacement.  I made  an incision inferior and posterior to the anterior superior iliac spine and carried this obliquely down the leg.  We dissected down to the tensor fascia lata muscle and the tensor fascia was divided longitudinally so we could proceed with a direct anterior approach to the hip.  A Cobra retractor was placed in the lateral neck and around the medial neck.  We cauterized the lateral femoral circumflex vessels and then opened up the hip capsule placing the Cobra retractor within the hip capsule.  We then used an oscillating saw to make our femoral neck cut just proximal to the lesser trochanter with an oscillating saw and completed this with an  osteotome.  We placed a corkscrew guide on the femoral head and removed the femoral head in its entirety.  We then cleaned the acetabulum debris including remnants to the labrum.  We placed a bent Hohmann medially and a Cobra retractor laterally, and then began reaming from size 42 in 2-mm increments up to a size 52 with all reamers under direct visualization and the last reamer under direct fluoroscopy, so we could obtain our depth of reaming under inclination and anteversion.  Once we were pleased with this, we placed the real DePuy Sector Gription acetabular component size 52 and it was nice and stable, so we placed the apex hole eliminator guide and the real 36+ 4 neutral polyethylene liner.  Attention was then turned to the femur.  With the femur externally rotated to 100 degrees, extended and adducted, we were able to place a Mueller retractor medially and Hohmann retractor behind the greater trochanter.  We used a box cutting osteotome to enter the femoral canal and rongeur to lateralize.  We then began broaching using the Corail broaching system from size 8 all the way up to the size 13.  The 13 was felt to be stable.  So, we trialed a standard neck and a 36+ 1.5 hip ball.  We brought the leg back down and over, and then with traction and internal rotation, reduced the pelvis and it was stable, and his leg lengths were measured equal.  We then dislocated the hip and removed the trial components.  We placed the real size 13 stem and with such a bite to it, I knew that we are going to be long, so we needed to go with 36- 2 metal hip ball.  We placed the real hip ball, and real components in place, we reduced it back in the acetabulum and it was stable, past 95 degrees of external rotation and internal rotation with minimal shuck and his leg lengths were measured equal under direct fluoroscopy.  We then copiously irrigated the soft tissue with normal saline solution.  I closed the  joint capsule with interrupted #1 Ethibond suture followed by running #1 Vicryl in the tensor fascia, 0 Vicryl in the deep tissue, 2-0 Vicryl in the subcutaneous tissue, 4-0 Monocryl subcuticular stitch and Steri-Strips on the skin.  An Aquacel dressing was applied.  He was taken off the Hana table, awakened, extubated and taken to the recovery room in stable condition.  All final counts were correct.  There were no complications noted.  Of note, Erskine Emery, PA-C's was integral in this case for patient positioning, traction, assisting with dislocation and relocation, and closure of the wound.     Lind Guest. Ninfa Linden, M.D.     CYB/MEDQ  D:  01/09/2014  T:  01/10/2014  Job:  [id number]

## 2014-01-10 NOTE — Progress Notes (Signed)
Physical Therapy Treatment Note  Continuing managing well with mobiltiy; Quite concerned with voiding Continues to be on track for dc home  4/10 L hip pain patient repositioned for comfort   01/10/14 1600  PT Visit Information  Last PT Received On 01/10/14  Assistance Needed +1  History of Present Illness Pt with L THA, anterior approach  PT Time Calculation  PT Start Time 1603  PT Stop Time 1618  PT Time Calculation (min) 15 min  Subjective Data  Subjective Very concerned with voiding; would rather not be in and out cathed  Patient Stated Goal to go home tomorrow  Restrictions  LLE Weight Bearing WBAT  Cognition  Arousal/Alertness Awake/alert  Behavior During Therapy WFL for tasks assessed/performed  Overall Cognitive Status Within Functional Limits for tasks assessed  Bed Mobility  Overal bed mobility Needs Assistance  Bed Mobility Supine to Sit  Supine to sit Min guard  General bed mobility comments Cues for technqiue  Transfers  Overall transfer level Needs assistance  Equipment used Rolling walker (2 wheeled)  Transfers Sit to/from Stand  Sit to Stand Min guard  General transfer comment Cues for safety, handplacement; good rise and good control of descent  Ambulation/Gait  Ambulation/Gait assistance Min guard  Ambulation Distance (Feet) 20 Feet (to/from bathroom)  Assistive device Rolling walker (2 wheeled)  Gait Pattern/deviations Step-through pattern  General Gait Details Cues for gait sequence and to fully extend L hip, especially in stance  Exercises  Exercises Total Joint  Total Joint Exercises  Quad Sets AROM;Left;5 reps  Heel Slides AROM;Left;5 reps  PT - End of Session  Activity Tolerance Patient tolerated treatment well  Patient left in chair;with call bell/phone within reach  Nurse Communication Mobility status  PT - Assessment/Plan  PT Frequency 7X/week  Recommendations for Other Services OT consult  Follow Up Recommendations Home health PT  PT  equipment Rolling walker with 5" wheels;3in1 (PT)  PT Goal Progression  Progress towards PT goals Progressing toward goals  Acute Rehab PT Goals  PT Goal Formulation With patient  Time For Goal Achievement 01/17/14  Potential to Achieve Goals Good  PT General Charges  $$ ACUTE PT VISIT 1 Procedure  PT Treatments  $Gait Training 8-22 mins  [place], [place]

## 2014-01-10 NOTE — Evaluation (Signed)
Occupational Therapy Evaluation Patient Details Name: Ryan [name] MRN: [id number] DOB: [birth year] Today's Date: 01/10/2014 Time: 2778-2423 OT Time Calculation (min): 25 min  OT Assessment / Plan / Recommendation History of present illness Pt with L THA, anterior approach   Clinical Impression   Pt demos decline in function with ADLs and ADL mobility safety and pt would benefit from acute OT services to address impairments to increase level of function and safety to return home. Pt's wife will be available 24/7 for sup and assist. Recommend 3 in  1 for home use    OT Assessment  Patient needs continued OT Services    Follow Up Recommendations  No OT follow up;Supervision/Assistance - 24 hour    Barriers to Discharge   none, pt;s wife will be available 24/7  Equipment Recommendations  3 in 1 bedside comode    Recommendations for Other Services    Frequency  Min 2X/week    Precautions / Restrictions Precautions Precautions: Anterior Hip Restrictions Weight Bearing Restrictions: Yes LLE Weight Bearing: Weight bearing as tolerated   Pertinent Vitals/Pain 6/10 L hip pain    ADL  Grooming: Performed;Wash/dry hands;Wash/dry face;Min guard Where Assessed - Grooming: Supported standing Upper Body Bathing: Simulated;Supervision/safety;Set up Where Assessed - Upper Body Bathing: Unsupported sitting Lower Body Bathing: Simulated;Moderate assistance Upper Body Dressing: Performed;Supervision/safety;Set up Where Assessed - Upper Body Dressing: Unsupported sitting Lower Body Dressing: Performed;Maximal assistance Toilet Transfer: Performed;Min guard Toilet Transfer Method: Sit to stand Toilet Transfer Equipment: Raised toilet seat with arms (or 3-in-1 over toilet);Grab bars Toileting - Clothing Manipulation and Hygiene: Performed;Minimal assistance Where Assessed - Best boy and Hygiene: Standing Tub/Shower Transfer Method: Not assessed Equipment Used:  Gait belt;Rolling walker;Other (comment) (3 in 1 over toilet)    OT Diagnosis: Acute pain  OT Problem List: Decreased knowledge of use of DME or AE;Decreased activity tolerance;Impaired balance (sitting and/or standing);Pain OT Treatment Interventions: Self-care/ADL training;Therapeutic exercise;Patient/family education;Balance training;Therapeutic activities;DME and/or AE instruction   OT Goals(Current goals can be found in the care plan section) Acute Rehab OT Goals Patient Stated Goal: to go home tomorrow OT Goal Formulation: With patient Time For Goal Achievement: 01/17/14 Potential to Achieve Goals: Good ADL Goals Pt Will Perform Grooming: with set-up;with supervision;standing Pt Will Perform Lower Body Bathing: with min assist;with caregiver independent in assisting;with adaptive equipment Pt Will Perform Lower Body Dressing: with mod assist;with min assist;with caregiver independent in assisting;with adaptive equipment Pt Will Transfer to Toilet: with supervision;with modified independence;ambulating;regular height toilet;grab bars (3 in 1 over toilet) Pt Will Perform Toileting - Clothing Manipulation and hygiene: with min guard assist;with supervision;sitting/lateral leans;sit to/from stand Pt Will Perform Tub/Shower Transfer: with min assist;with min guard assist;shower seat  Visit Information  Last OT Received On: 01/10/14 PT/OT/SLP Co-Evaluation/Treatment: Yes Reason for Co-Treatment: For patient/therapist safety OT goals addressed during session: ADL's and self-care History of Present Illness: Pt with L THA, anterior approach       Prior Lyndon expects to be discharged to:: Private residence Living Arrangements: Spouse/significant other Type of Home: House Home Access: Stairs to enter [employer] of Steps: 2 Entrance Stairs-Rails: None Home Layout: Two level;Able to live on main level with bedroom/bathroom Alternate  Level Stairs-Rails: Right Home Equipment: Walker - 4 wheels;Cane - single point Prior Function Level of Independence: Independent Communication Communication: No difficulties Dominant Hand: Right         Vision/Perception Vision - History Baseline Vision: Wears glasses only for reading Patient Visual  Report: No change from baseline Perception Perception: Within Functional Limits   Cognition  Cognition Arousal/Alertness: Awake/alert Behavior During Therapy: WFL for tasks assessed/performed Overall Cognitive Status: Within Functional Limits for tasks assessed    Extremity/Trunk Assessment Upper Extremity Assessment Upper Extremity Assessment: Overall WFL for tasks assessed Cervical / Trunk Assessment Cervical / Trunk Assessment: Normal     Mobility Bed Mobility Overal bed mobility: Needs Assistance Bed Mobility: Supine to Sit Supine to sit: Min guard Transfers Overall transfer level: Needs assistance Transfers: Sit to/from Stand Sit to Stand: Min guard          Balance Balance Overall balance assessment: Needs assistance Sitting-balance support: Bilateral upper extremity supported;No upper extremity supported;Feet supported Sitting balance-Leahy Scale: Good Standing balance support: Single extremity supported;During functional activity Standing balance-Leahy Scale: Fair   End of Session OT - End of Session Equipment Utilized During Treatment: Gait belt;Rolling walker;Other (comment) (3 in1) Activity Tolerance: Patient tolerated treatment well Patient left: in chair;with call bell/phone within reach Nurse Communication: Mobility status  GO     Britt Bottom 01/10/2014, 1:15 PM

## 2014-01-10 NOTE — Progress Notes (Signed)
Patient voided 171ml spontaneosly. He stated that after past surgeries his voiding initially started slow and then would return to normal. Will continue to monitor.

## 2014-01-10 NOTE — Evaluation (Signed)
Physical Therapy Evaluation Patient Details Name: Ryan [name] MRN: [id number] DOB: [birth year] Today's Date: 01/10/2014 Time: 1660-6301 PT Time Calculation (min): 27 min  PT Assessment / Plan / Recommendation History of Present Illness  Pt with L THA, anterior approach  Clinical Impression  Pt is s/p THA resulting in the deficits listed below (see PT Problem List).  Pt will benefit from skilled PT to increase their independence and safety with mobility to allow discharge to the venue listed below.        PT Assessment  Patient needs continued PT services    Follow Up Recommendations  Home health PT    Does the patient have the potential to tolerate intense rehabilitation      Barriers to Discharge        Equipment Recommendations  Rolling walker with 5" wheels;3in1 (PT)    Recommendations for Other Services OT consult   Frequency 7X/week    Precautions / Restrictions Precautions Precautions: Anterior Hip Restrictions Weight Bearing Restrictions: Yes LLE Weight Bearing: Weight bearing as tolerated   Pertinent Vitals/Pain 6/10 L hip pain with movign; patient repositioned for comfort       Mobility  Bed Mobility Overal bed mobility: Needs Assistance Bed Mobility: Supine to Sit Supine to sit: Min guard General bed mobility comments: Cues for technqiue Transfers Overall transfer level: Needs assistance Equipment used: Rolling walker (2 wheeled) Transfers: Sit to/from Stand Sit to Stand: Min guard General transfer comment: Cues for safety, handplacement; good rise and good control of descent Ambulation/Gait Ambulation/Gait assistance: Min guard;Supervision Ambulation Distance (Feet): 80 Feet Assistive device: Rolling walker (2 wheeled) Gait Pattern/deviations: Step-through pattern General Gait Details: Cues for gait sequence and to fully extend L hip, especially in stance    Exercises Total Joint Exercises Quad Sets: AROM;Left;5 reps Heel Slides:  AROM;Left;5 reps   PT Diagnosis: Difficulty walking;Acute pain  PT Problem List: Decreased strength;Decreased range of motion;Decreased activity tolerance;Decreased balance;Decreased knowledge of use of DME;Pain PT Treatment Interventions: DME instruction;Gait training;Stair training;Functional mobility training;Therapeutic activities;Therapeutic exercise;Patient/family education     PT Goals(Current goals can be found in the care plan section) Acute Rehab PT Goals Patient Stated Goal: to go home tomorrow PT Goal Formulation: With patient Time For Goal Achievement: 01/17/14 Potential to Achieve Goals: Good  Visit Information  Last PT Received On: 01/10/14 Assistance Needed: +1 PT/OT/SLP Co-Evaluation/Treatment: Yes Reason for Co-Treatment: For patient/therapist safety PT goals addressed during session: Mobility/safety with mobility OT goals addressed during session: ADL's and self-care History of Present Illness: Pt with L THA, anterior approach       Prior [place] expects to be discharged to:: Private residence Living Arrangements: Spouse/significant other Available Help at Discharge: Family;Available PRN/intermittently;Available 24 hours/day Type of Home: House Home Access: Stairs to enter [employer] of Steps: 2 Entrance Stairs-Rails: None Home Layout: Two level;Able to live on main level with bedroom/bathroom Alternate Level Stairs-Rails: Right Home Equipment: Walker - 4 wheels;Cane - single point Prior Function Level of Independence: Independent Communication Communication: No difficulties Dominant Hand: Right    Cognition  Cognition Arousal/Alertness: Awake/alert Behavior During Therapy: WFL for tasks assessed/performed Overall Cognitive Status: Within Functional Limits for tasks assessed    Extremity/Trunk Assessment Upper Extremity Assessment Upper Extremity Assessment: Overall WFL for tasks assessed Lower Extremity  Assessment Lower Extremity Assessment: LLE deficits/detail LLE Deficits / Details: Grossly decr ROM and strength postop Cervical / Trunk Assessment Cervical / Trunk Assessment: Normal   Balance Balance Overall balance assessment: Needs assistance Sitting-balance  support: Bilateral upper extremity supported;No upper extremity supported;Feet supported Sitting balance-Leahy Scale: Good Standing balance support: Single extremity supported;During functional activity Standing balance-Leahy Scale: Fair  End of Session PT - End of Session Activity Tolerance: Patient tolerated treatment well Patient left: in chair;with call bell/phone within reach Nurse Communication: Mobility status  GP     Roney Marion [facility] [place], [place]  01/10/2014, 1:59 PM

## 2014-01-10 NOTE — Progress Notes (Signed)
Patient unable to spontaneously void after foley removal. He has no urge or discomfort at this time. Will monitor until 1430 for void, then initiate post foley removal protocol if needed. Patient states he had difficulties voiding before after a surgery and had to be in and out cathed. Will monitor and intervene as needed.

## 2014-01-10 NOTE — Progress Notes (Signed)
Subjective: 1 Day Post-Op Procedure(s) (LRB): LEFT TOTAL HIP ARTHROPLASTY ANTERIOR APPROACH and Steroid Injection Right hip (Left) STEROID INJECTION (Right) Patient reports pain as moderate.  Doing well ready to get up with PT.  Objective: Vital signs in last 24 hours: Temp:  [97.6 F (36.4 C)-99.1 F (37.3 C)] 97.9 F (36.6 C) (01/21 0429) Pulse Rate:  [82-102] 97 (01/21 0429) Resp:  [10-21] 18 (01/21 0429) BP: (111-160)/(50-78) 132/68 mmHg (01/21 0429) SpO2:  [94 %-99 %] 97 % (01/21 0429) Weight:  [84.823 kg (187 lb)] 84.823 kg (187 lb) (01/21 0700)  Intake/Output from previous day: 01/20 0701 - 01/21 0700 In: 2786.3 [P.O.:210; I.V.:2576.3] Out: 1125 [Urine:925; Blood:200] Intake/Output this shift:     Recent Labs  01/10/14 0518  HGB 11.7*    Recent Labs  01/10/14 0518  WBC 16.2*  RBC 3.69*  HCT 35.0*  PLT 234    Recent Labs  01/10/14 0518  NA 140  K 4.3  CL 104  CO2 23  BUN 19  CREATININE 1.01  GLUCOSE 141*  CALCIUM 8.2*   No results found for this basename: LABPT, INR,  in the last 72 hours  Sensation intact distally Intact pulses distally Dorsiflexion/Plantar flexion intact Incision: dressing C/D/I Compartment soft  Assessment/Plan: 1 Day Post-Op Procedure(s) (LRB): LEFT TOTAL HIP ARTHROPLASTY ANTERIOR APPROACH and Steroid Injection Right hip (Left) STEROID INJECTION (Right) Up with therapy Monitor HGB Flomax for history of urinary retention.  Erskine Emery 01/10/2014, 8:51 AM

## 2014-01-10 NOTE — Progress Notes (Signed)
Patient still unable to void post foley removal after 8 hours. Patient has no discomfort or urge at this time. Bladder scan performed and only 263ml of urine resulted. Unable to initiate protocol for post catheter removal if amount is not >28ml. Will continue to monitor Q2.

## 2014-01-11 ENCOUNTER — Encounter (HOSPITAL_COMMUNITY): Payer: Self-pay | Admitting: Orthopaedic Surgery

## 2014-01-11 LAB — CBC
HCT: 32.7 % — ABNORMAL LOW (ref 39.0–52.0)
Hemoglobin: 10.9 g/dL — ABNORMAL LOW (ref 13.0–17.0)
MCH: 32 pg (ref 26.0–34.0)
MCHC: 33.3 g/dL (ref 30.0–36.0)
MCV: 95.9 fL (ref 78.0–100.0)
Platelets: 208 10*3/uL (ref 150–400)
RBC: 3.41 MIL/uL — ABNORMAL LOW (ref 4.22–5.81)
RDW: 12.9 % (ref 11.5–15.5)
WBC: 12.8 10*3/uL — ABNORMAL HIGH (ref 4.0–10.5)

## 2014-01-11 NOTE — Progress Notes (Signed)
Physical Therapy Treatment Patient Details Name: Ryan [name] MRN: [id number] DOB: [birth year] Today's Date: 01/11/2014 Time: 8022-3361 PT Time Calculation (min): 14 min  PT Assessment / Plan / Recommendation  History of Present Illness Pt with L THA, anterior approach   PT Comments   Pt cont's to make progress with mobility.    Follow Up Recommendations  Home health PT     Does the patient have the potential to tolerate intense rehabilitation     Barriers to Discharge        Equipment Recommendations  Rolling walker with 5" wheels;3in1 (PT)    Recommendations for Other Services OT consult  Frequency 7X/week   Progress towards PT Goals Progress towards PT goals: Progressing toward goals  Plan Current plan remains appropriate    Precautions / Restrictions Precautions Precautions: Anterior Hip Restrictions Weight Bearing Restrictions: Yes LLE Weight Bearing: Weight bearing as tolerated   Pertinent Vitals/Pain C/o mild Lt hip discomfort but did not rate.  Appeared comfortable with repositioning.      Mobility  Bed Mobility Overal bed mobility: Modified Independent Bed Mobility: Supine to Sit;Sit to Supine Supine to sit: Supervision;HOB elevated General bed mobility comments: use of rail to push himself to sitting upright.   Transfers Overall transfer level: Needs assistance Equipment used: Rolling walker (2 wheeled) Transfers: Sit to/from Stand Sit to Stand: Min guard General transfer comment: cues to reinforce hand placement Ambulation/Gait Ambulation/Gait assistance: Min guard Ambulation Distance (Feet): 180 Feet Assistive device: Rolling walker (2 wheeled) Gait Pattern/deviations: Step-through pattern;Decreased stride length;Decreased weight shift to left;Antalgic General Gait Details: Pt progressing with fluidity of gait sequence but cont's to be antalgic.   Stairs: Yes Stairs assistance: Min guard Stair Management: One rail Right;Forwards Number of  Stairs: 3 General stair comments: cues for sequencing & technique.      Exercises Total Joint Exercises Ankle Circles/Pumps: AROM;Both;10 reps Quad Sets: AROM;Strengthening;Both;10 reps Gluteal Sets: AROM;Strengthening;Both;10 reps Heel Slides: AAROM;Strengthening;Left;10 reps Hip ABduction/ADduction: AAROM;Strengthening;Left;10 reps Straight Leg Raises: AAROM;Strengthening;10 reps;Left     PT Goals (current goals can now be found in the care plan section) Acute Rehab PT Goals Patient Stated Goal: to go home  PT Goal Formulation: With patient Time For Goal Achievement: 01/17/14 Potential to Achieve Goals: Good  Visit Information  Last PT Received On: 01/11/14 Assistance Needed: +1 History of Present Illness: Pt with L THA, anterior approach    Subjective Data  Patient Stated Goal: to go home    Cognition  Cognition Arousal/Alertness: Awake/alert Behavior During Therapy: WFL for tasks assessed/performed Overall Cognitive Status: Within Functional Limits for tasks assessed    Balance     End of Session PT - End of Session Activity Tolerance: Patient tolerated treatment well Patient left: in bed;with call bell/phone within reach Nurse Communication: Mobility status   GP     Sena Hitch 01/11/2014, 1:40 PM  Sarajane Marek, PTA [phone] 01/11/2014

## 2014-01-11 NOTE — Progress Notes (Signed)
Physical Therapy Treatment Patient Details Name: KASHIS PENLEY MRN: [id number] DOB: [birth year] Today's Date: 01/11/2014 Time: 9371-6967 PT Time Calculation (min): 23 min  PT Assessment / Plan / Recommendation  History of Present Illness Pt with L THA, anterior approach   PT Comments   Pt making good progress with mobility.  Ambulated from room<>ortho gym & completed stair training this session.  Pt states MD anticipates d/c home tomorrow rather than today.  Cont with current POC.     Follow Up Recommendations  Home health PT     Does the patient have the potential to tolerate intense rehabilitation     Barriers to Discharge        Equipment Recommendations  Rolling walker with 5" wheels;3in1 (PT)    Recommendations for Other Services OT consult  Frequency 7X/week   Progress towards PT Goals Progress towards PT goals: Progressing toward goals  Plan Current plan remains appropriate    Precautions / Restrictions Restrictions LLE Weight Bearing: Weight bearing as tolerated       Mobility  Transfers Overall transfer level: Needs assistance Equipment used: Rolling walker (2 wheeled) Transfers: Sit to/from Stand Sit to Stand: Min guard General transfer comment: cues to reinforce hand placement Ambulation/Gait Ambulation/Gait assistance: Min guard Ambulation Distance (Feet): 160 Feet (seated rest break in gym after steps) Assistive device: Rolling walker (2 wheeled) Gait Pattern/deviations: Step-through pattern;Decreased stride length;Antalgic;Decreased weight shift to left General Gait Details: cues for increased heel strike LLE, for increased WBing LLE, & increased step/stride length.   Stairs: Yes Stairs assistance: Min guard Stair Management: One rail Right;Forwards Number of Stairs: 3 General stair comments: cues for sequencing & technique.        PT Goals (current goals can now be found in the care plan section) Acute Rehab PT Goals Patient Stated Goal: to go  home  PT Goal Formulation: With patient Time For Goal Achievement: 01/17/14 Potential to Achieve Goals: Good  Visit Information  Last PT Received On: 01/11/14 Assistance Needed: +1 History of Present Illness: Pt with L THA, anterior approach    Subjective Data  Patient Stated Goal: to go home    Cognition  Cognition Arousal/Alertness: Awake/alert Behavior During Therapy: WFL for tasks assessed/performed Overall Cognitive Status: Within Functional Limits for tasks assessed    Balance     End of Session PT - End of Session Activity Tolerance: Patient tolerated treatment well Patient left: in chair;with call bell/phone within reach Nurse Communication: Mobility status   GP     Sena Hitch 01/11/2014, 12:47 PM  Sarajane Marek, PTA [phone] 01/11/2014

## 2014-01-11 NOTE — Progress Notes (Signed)
Subjective: 2 Days Post-Op Procedure(s) (LRB): LEFT TOTAL HIP ARTHROPLASTY ANTERIOR APPROACH and Steroid Injection Right hip (Left) STEROID INJECTION (Right) Patient reports pain as moderate.  Nausea from intolerance to pain meds.  Asymptomatic acute blood loss anemia.  Voiding better.  Objective: Vital signs in last 24 hours: Temp:  [98.2 F (36.8 C)-98.7 F (37.1 C)] 98.7 F (37.1 C) (01/22 0320) Pulse Rate:  [88-101] 88 (01/22 1007) Resp:  [16-96] 96 (01/22 0400) BP: (112-151)/(56-81) 112/56 mmHg (01/22 1007) SpO2:  [92 %-95 %] 92 % (01/22 0320)  Intake/Output from previous day: 01/21 0701 - 01/22 0700 In: 1720 [P.O.:960; I.V.:760] Out: 650 [Urine:650] Intake/Output this shift: Total I/O In: 360 [P.O.:360] Out: 300 [Urine:300]   Recent Labs  01/10/14 0518 01/11/14 0438  HGB 11.7* 10.9*    Recent Labs  01/10/14 0518 01/11/14 0438  WBC 16.2* 12.8*  RBC 3.69* 3.41*  HCT 35.0* 32.7*  PLT 234 208    Recent Labs  01/10/14 0518  NA 140  K 4.3  CL 104  CO2 23  BUN 19  CREATININE 1.01  GLUCOSE 141*  CALCIUM 8.2*   No results found for this basename: LABPT, INR,  in the last 72 hours  Sensation intact distally Intact pulses distally Dorsiflexion/Plantar flexion intact Incision: scant drainage  Assessment/Plan: 2 Days Post-Op Procedure(s) (LRB): LEFT TOTAL HIP ARTHROPLASTY ANTERIOR APPROACH and Steroid Injection Right hip (Left) STEROID INJECTION (Right) Up with therapy Plan for discharge tomorrow  Ryan [name] 01/11/2014, 10:27 AM

## 2014-01-11 NOTE — Care Management Note (Signed)
CARE MANAGEMENT NOTE 01/11/2014  Patient:  Ryan [name], Ryan [name]   Account Number:  [id number]  Date Initiated:  01/11/2014  Documentation initiated by:  Ricki Miller  Subjective/Objective Assessment:   73 yr old male s/ p left anterior hip arthroplasty.     Action/Plan:   Case Manager spoke with patient concerning [place] and DME needs at discharge. patient preoperatively setup with [facility] home [facility], no changes. Pt has family support at discharge.   Anticipated DC Date:  01/12/2014   Anticipated DC Plan:  [place] [facility]  CM consult      [facility] Choice  HOME HEALTH  DURABLE MEDICAL EQUIPMENT   Choice offered to / List presented to:  C-1 Patient   DME arranged  3-N-1  Vassie Moselle      DME agency  [place] arranged  [place]   Status of service:  Completed, signed off Medicare Important Message given?   (If response is "NO", the following Medicare IM given date fields will be blank) Date Medicare IM given:   Date Additional Medicare IM given:    Discharge Disposition:  [place]

## 2014-01-11 NOTE — Progress Notes (Signed)
Occupational Therapy Treatment Patient Details Name: ELICEO GLADU MRN: [id number] DOB: 1941/10/22 Today's Date: 01/11/2014 Time: 2202-5427 OT Time Calculation (min): 12 min  OT Assessment / Plan / Recommendation  History of present illness Pt with L THA, anterior approach   OT comments  Pt making progress with functional goals and should continue with acute OT services to increase level of function and safety to return home  Follow Up Recommendations  No OT follow up;Supervision/Assistance - 24 hour    Barriers to Discharge   none    Equipment Recommendations  3 in 1 bedside comode    Recommendations for Other Services    Frequency Min 2X/week   Progress towards OT Goals Progress towards OT goals: Progressing toward goals  Plan Discharge plan remains appropriate    Precautions / Restrictions Precautions Precautions: Anterior Hip Restrictions Weight Bearing Restrictions: Yes LLE Weight Bearing: Weight bearing as tolerated   Pertinent Vitals/Pain 4/10 L hip    ADL  Grooming: Performed;Wash/dry hands;Wash/dry face;Brushing hair;Supervision/safety;Set up Lower Body Bathing: Simulated;Minimal assistance Lower Body Dressing: Simulated;Moderate assistance Toilet Transfer: Performed;Min Psychiatric nurse Method: Sit to Loss adjuster, chartered: Raised toilet seat with arms (or 3-in-1 over toilet);Grab bars Toileting - Clothing Manipulation and Hygiene: Performed;Min guard Where Assessed - Best boy and Hygiene: Standing Tub/Shower Transfer: Performed Equipment Used: Rolling walker;Gait belt Transfers/Ambulation Related to ADLs: cues to reinforce hand placement    OT Diagnosis:    OT Problem List:   OT Treatment Interventions:     OT Goals(current goals can now be found in the care plan section) Acute Rehab OT Goals Patient Stated Goal: to go home   Visit Information  Last OT Received On: 01/11/14 Assistance Needed: +1 History of  Present Illness: Pt with L THA, anterior approach    Subjective Data      Prior Functioning       Cognition  Cognition Arousal/Alertness: Awake/alert Behavior During Therapy: WFL for tasks assessed/performed Overall Cognitive Status: Within Functional Limits for tasks assessed    Mobility  Bed Mobility Overal bed mobility: Needs Assistance Bed Mobility: Supine to Sit Supine to sit: Supervision;HOB elevated Transfers Overall transfer level: Needs assistance Equipment used: Rolling walker (2 wheeled) Transfers: Sit to/from Stand Sit to Stand: Min guard General transfer comment: cues to reinforce hand placement              End of Session OT - End of Session Equipment Utilized During Treatment: Rolling walker Activity Tolerance: Patient tolerated treatment well Patient left: with call bell/phone within reach;in bed;with family/visitor present  GO     Britt Bottom 01/11/2014, 12:56 PM

## 2014-01-12 LAB — CBC
HCT: 30.6 % — ABNORMAL LOW (ref 39.0–52.0)
HEMOGLOBIN: 10.6 g/dL — AB (ref 13.0–17.0)
MCH: 32.4 pg (ref 26.0–34.0)
MCHC: 34.6 g/dL (ref 30.0–36.0)
MCV: 93.6 fL (ref 78.0–100.0)
PLATELETS: 177 10*3/uL (ref 150–400)
RBC: 3.27 MIL/uL — ABNORMAL LOW (ref 4.22–5.81)
RDW: 12.8 % (ref 11.5–15.5)
WBC: 11.4 10*3/uL — ABNORMAL HIGH (ref 4.0–10.5)

## 2014-01-12 MED ORDER — METHOCARBAMOL 500 MG PO TABS: 500.0000 mg | ORAL_TABLET | Freq: Four times a day (QID) | ORAL | Status: DC | PRN

## 2014-01-12 MED ORDER — HYDROCODONE-ACETAMINOPHEN 5-325 MG PO TABS: 1.0000 | ORAL_TABLET | Freq: Four times a day (QID) | ORAL | Status: DC | PRN

## 2014-01-12 MED ORDER — TRAMADOL HCL 50 MG PO TABS: 100.0000 mg | ORAL_TABLET | Freq: Four times a day (QID) | ORAL | Status: DC | PRN

## 2014-01-12 MED ORDER — ASPIRIN 325 MG PO TBEC: 325.0000 mg | DELAYED_RELEASE_TABLET | Freq: Two times a day (BID) | ORAL | Status: DC

## 2014-01-12 MED ORDER — ONDANSETRON HCL 4 MG PO TABS: 4.0000 mg | ORAL_TABLET | Freq: Three times a day (TID) | ORAL | Status: DC | PRN

## 2014-01-12 NOTE — Progress Notes (Signed)
Subjective: 3 Days Post-Op Procedure(s) (LRB): LEFT TOTAL HIP ARTHROPLASTY ANTERIOR APPROACH and Steroid Injection Right hip (Left) STEROID INJECTION (Right) Patient reports pain as moderate.    Objective: Vital signs in last 24 hours: Temp:  [97.9 F (36.6 C)-99.4 F (37.4 C)] 99.4 F (37.4 C) (01/22 2119) Pulse Rate:  [88-100] 100 (01/23 0603) Resp:  [18] 18 (01/23 0603) BP: (112-145)/(56-65) 132/56 mmHg (01/23 0603) SpO2:  [92 %-96 %] 94 % (01/23 0603)  Intake/Output from previous day: 01/22 0701 - 01/23 0700 In: 1080 [P.O.:1080] Out: 1525 [Urine:1525] Intake/Output this shift: Total I/O In: 240 [P.O.:240] Out: 1225 [Urine:1225]   Recent Labs  01/10/14 0518 01/11/14 0438 01/12/14 0424  HGB 11.7* 10.9* 10.6*    Recent Labs  01/11/14 0438 01/12/14 0424  WBC 12.8* 11.4*  RBC 3.41* 3.27*  HCT 32.7* 30.6*  PLT 208 177    Recent Labs  01/10/14 0518  NA 140  K 4.3  CL 104  CO2 23  BUN 19  CREATININE 1.01  GLUCOSE 141*  CALCIUM 8.2*   No results found for this basename: LABPT, INR,  in the last 72 hours  Sensation intact distally Intact pulses distally Dorsiflexion/Plantar flexion intact Incision: dressing C/D/I Compartment soft  Assessment/Plan: 3 Days Post-Op Procedure(s) (LRB): LEFT TOTAL HIP ARTHROPLASTY ANTERIOR APPROACH and Steroid Injection Right hip (Left) STEROID INJECTION (Right) Discharge home with home health  Mcarthur Rossetti 01/12/2014, 6:59 AM

## 2014-01-12 NOTE — Discharge Summary (Signed)
Patient ID: Ryan [name] MRN: [id number] DOB/AGE: Jun 18, 1941 73 y.o.  Admit date: 01/09/2014 Discharge date: 01/12/2014  Admission Diagnoses:  Principal Problem:   Arthritis of left hip Active Problems:   Status post THR (total hip replacement)   Discharge Diagnoses:  Same  Past Medical History  Diagnosis Date  . Arthritis   . Myocardial infarction     66    dr Brigitte Pulse PCP          . Hyperlipidemia     takes Crestor daily  . Peripheral vascular disease   . Carotid artery occlusion   . Hypertension     takes Amlodipine daily  . Chronic back pain   . History of gout     has colchicine prn  . History of colon polyps   . Urinary frequency   . Urinary urgency   . Enlarged prostate     takes Rapaflo daily  . History of kidney stones   . Hyperthyroidism     takes SYnthroid daily as instructed  . Cataract     Bil eyes/worse in left eye  . GERD (gastroesophageal reflux disease)     occasional    Surgeries: Procedure(s): LEFT TOTAL HIP ARTHROPLASTY ANTERIOR APPROACH and Steroid Injection Right hip STEROID INJECTION on 01/09/2014   Consultants:    Discharged Condition: Improved  Hospital Course: Ryan [name] is an 73 y.o. male who was admitted 01/09/2014 for operative treatment ofArthritis of left hip. Patient has severe unremitting pain that affects sleep, daily activities, and work/hobbies. After pre-op clearance the patient was taken to the operating room on 01/09/2014 and underwent  Procedure(s): LEFT TOTAL HIP ARTHROPLASTY ANTERIOR APPROACH and Steroid Injection Right hip STEROID INJECTION.    Patient was given perioperative antibiotics: Anti-infectives   Start     Dose/Rate Route Frequency Ordered Stop   01/09/14 1945  ceFAZolin (ANCEF) IVPB 1 g/50 mL premix     1 g 100 mL/hr over 30 Minutes Intravenous Every 6 hours 01/09/14 1843 01/10/14 0259   01/09/14 0600  ceFAZolin (ANCEF) IVPB 2 g/50 mL premix     2 g 100 mL/hr over 30 Minutes Intravenous On call  to O.R. 01/08/14 1428 01/09/14 1329       Patient was given sequential compression devices, early ambulation, and chemoprophylaxis to prevent DVT.  Patient benefited maximally from hospital stay and there were no complications.    Recent vital signs: Patient Vitals for the past 24 hrs:  BP Temp Temp src Pulse Resp SpO2  01/12/14 0603 132/56 mmHg - Oral 100 18 94 %  01/11/14 2119 145/65 mmHg 99.4 F (37.4 C) Oral 98 18 96 %  01/11/14 1400 119/60 mmHg 97.9 F (36.6 C) - 95 18 92 %  01/11/14 1007 112/56 mmHg - - 88 - -     Recent laboratory studies:  Recent Labs  01/10/14 0518 01/11/14 0438 01/12/14 0424  WBC 16.2* 12.8* 11.4*  HGB 11.7* 10.9* 10.6*  HCT 35.0* 32.7* 30.6*  PLT 234 208 177  NA 140  --   --   K 4.3  --   --   CL 104  --   --   CO2 23  --   --   BUN 19  --   --   CREATININE 1.01  --   --   GLUCOSE 141*  --   --   CALCIUM 8.2*  --   --      Discharge Medications:     Medication List  amLODipine 5 MG tablet  Commonly known as:  NORVASC  Take 5 mg by mouth at bedtime.     aspirin 325 MG EC tablet  Take 1 tablet (325 mg total) by mouth 2 (two) times daily after a meal.     diclofenac sodium 1 % Gel  Commonly known as:  VOLTAREN  Apply 2 g topically every 6 (six) hours as needed (for pain).     gabapentin 100 MG capsule  Commonly known as:  NEURONTIN  Take 100-300 mg by mouth 3 (three) times daily. Take 1 cap in the morning and afternoon. Take 3 caps in the evening     HYDROcodone-acetaminophen 5-325 MG per tablet  Commonly known as:  NORCO  Take 1-2 tablets by mouth every 6 (six) hours as needed for moderate pain or severe pain.     INDOMETHACIN ER PO  Take 75 mg by mouth as needed (for gout).     levothyroxine 75 MCG tablet  Commonly known as:  SYNTHROID, LEVOTHROID  Take 75 mcg by mouth daily.     methocarbamol 500 MG tablet  Commonly known as:  ROBAXIN  Take 1 tablet (500 mg total) by mouth every 6 (six) hours as needed for  muscle spasms.     metoprolol tartrate 25 MG tablet  Commonly known as:  LOPRESSOR  Take 1 tablet (25 mg total) by mouth 2 (two) times daily.     ondansetron 4 MG tablet  Commonly known as:  ZOFRAN  Take 1 tablet (4 mg total) by mouth every 8 (eight) hours as needed for nausea or vomiting.     rosuvastatin 40 MG tablet  Commonly known as:  CRESTOR  Take 40 mg by mouth daily.     silodosin 8 MG Caps capsule  Commonly known as:  RAPAFLO  Take 8 mg by mouth daily with breakfast.     traMADol 50 MG tablet  Commonly known as:  ULTRAM  Take 2 tablets (100 mg total) by mouth every 6 (six) hours as needed for moderate pain.        Diagnostic Studies: Dg Chest 2 View  01/02/2014   CLINICAL DATA:  Hypertension.  Preoperative chest.  EXAM: CHEST  2 VIEW  COMPARISON:  02/05/2012 .  FINDINGS: Mediastinum hilar structures normal. Lungs are clear. Heart size normal. No pleural effusion or pneumothorax. Postsurgical changes again noted of the left shoulder, these changes are stable. No acute osseous abnormality. Degenerative changes thoracic spine. Surgical clips upper abdomen.  IMPRESSION: No active cardiopulmonary disease.   Electronically Signed   By: Marcello Moores  Register   On: 01/02/2014 15:38   Dg Hip Operative Left  01/09/2014   CLINICAL DATA:  Osteoarthritis left hip.  Total hip arthroplasty.  EXAM: OPERATIVE LEFT HIP  COMPARISON:  None  FINDINGS: Exam demonstrates a left total hip arthroplasty intact and normally located. There are surgical clips over the right femoral region.  IMPRESSION: Left total hip arthroplasty intact and normally located. Recommend correlation with findings at the time of the procedure.   Electronically Signed   By: Marin Olp M.D.   On: 01/09/2014 15:57   Dg Pelvis Portable  01/09/2014   CLINICAL DATA:  Left hip replacement.  EXAM: PORTABLE PELVIS 1-2 VIEWS  COMPARISON:  MRI of left hip 07/28/2012.  FINDINGS: A new left total hip replacement is in place. Gas in the  soft tissues from surgery is noted. The device is located and there is no fracture. Mild appearing right hip osteoarthritis  is noted.  IMPRESSION: Left total hip replacement without evidence of complication.   Electronically Signed   By: Inge Rise M.D.   On: 01/09/2014 16:40   Dg Hip Portable 1 View Left  01/09/2014   CLINICAL DATA:  Postop left hip.  EXAM: PORTABLE LEFT HIP - 1 VIEW  COMPARISON:  Hip series 01/09/2014.  FINDINGS: Patient status post left hip replacement. Cross-table lateral left hip reveals good anatomic alignment . No acute bony abnormality.  IMPRESSION: Patient status post left hip replacement. Good anatomic alignment on cross-table lateral.   Electronically Signed   By: Marcello Moores  Register   On: 01/09/2014 16:22    Disposition: 01-Home or Self Care      Discharge Orders   Future Orders Complete By Expires   Call MD / Call 911  As directed    Comments:     If you experience chest pain or shortness of breath, CALL 911 and be transported to the hospital emergency room.  If you develope a fever above 101 F, pus (white drainage) or increased drainage or redness at the wound, or calf pain, call your surgeon's office.   Constipation Prevention  As directed    Comments:     Drink plenty of fluids.  Prune juice may be helpful.  You may use a stool softener, such as Colace (over the counter) 100 mg twice a day.  Use MiraLax (over the counter) for constipation as needed.   Diet - low sodium heart healthy  As directed    Discharge instructions  As directed    Comments:     Full weight as tolerated left hip. You can get your current dressing wet in the shower. You can remove your acual dressing 01/16/14 and get your actual incision wet at that point; then new dry dressing daily   Discharge patient  As directed    Increase activity slowly as tolerated  As directed       Follow-up Information   Follow up with Mcarthur Rossetti, MD In 2 weeks.   Specialty:  Orthopedic  Surgery   Contact information:   [place] [address] [phone]        Signed: Mcarthur Rossetti 01/12/2014, 7:04 AM

## 2014-01-12 NOTE — Progress Notes (Signed)
Occupational Therapy Treatment Patient Details Name: Ryan [name] MRN: [id number] DOB: Jul 30, 1941 Today's Date: 01/12/2014 Time: 3825-0539 OT Time Calculation (min): 11 min  OT Assessment / Plan / Recommendation  History of present illness Pt with L THA, anterior approach   OT comments  Pt progressing toward goals. Pt hopeful to d/c home today.  Requires min assist for LB ADLs (wife to assist) and supervision-mod I for functional mobility.  Follow Up Recommendations  No OT follow up;Supervision/Assistance - 24 hour    Barriers to Discharge       Equipment Recommendations  3 in 1 bedside comode    Recommendations for Other Services    Frequency Min 2X/week   Progress towards OT Goals Progress towards OT goals: Progressing toward goals  Plan Discharge plan remains appropriate    Precautions / Restrictions Precautions Precautions: Anterior Hip Restrictions Weight Bearing Restrictions: Yes LLE Weight Bearing: Weight bearing as tolerated   Pertinent Vitals/Pain See vitals    ADL  Grooming: Performed;Wash/dry hands;Modified independent Where Assessed - Grooming: Unsupported standing Lower Body Bathing: Simulated;Minimal assistance Where Assessed - Lower Body Bathing: Unsupported sit to stand Lower Body Dressing: Simulated;Minimal assistance Where Assessed - Lower Body Dressing: Unsupported sit to stand Toilet Transfer: Performed;Supervision/safety Toilet Transfer Method: Sit to Loss adjuster, chartered: Comfort height toilet Toileting - Clothing Manipulation and Hygiene: Performed;Modified independent Where Assessed - Toileting Clothing Manipulation and Hygiene: Sit to stand from 3-in-1 or toilet Tub/Shower Transfer: Simulated;Supervision/safety Tub/Shower Transfer Method: Therapist, art: Walk in shower Equipment Used: Rolling walker Transfers/Ambulation Related to ADLs: supervision with RW ADL Comments: Pt requires min assist with LB  ADLs but reports his wife will assist until he is able to complete independently.  Recommended pt sit EOB or in chair and thread LLE through clothing first for donning process.  Educated pt on use of 3n1 as shower chair as well as for over toilet.      OT Diagnosis:    OT Problem List:   OT Treatment Interventions:     OT Goals(current goals can now be found in the care plan section) Acute Rehab OT Goals Patient Stated Goal: to go home  OT Goal Formulation: With patient Time For Goal Achievement: 01/17/14 Potential to Achieve Goals: Good ADL Goals Pt Will Perform Grooming: with set-up;with supervision;standing Pt Will Perform Lower Body Bathing: with min assist;with caregiver independent in assisting;with adaptive equipment Pt Will Perform Lower Body Dressing: with mod assist;with min assist;with caregiver independent in assisting;with adaptive equipment Pt Will Transfer to Toilet: with supervision;with modified independence;ambulating;regular height toilet;grab bars Pt Will Perform Toileting - Clothing Manipulation and hygiene: with min guard assist;with supervision;sitting/lateral leans;sit to/from stand Pt Will Perform Tub/Shower Transfer: with min assist;with min guard assist;shower seat  Visit Information  Last OT Received On: 01/12/14 Assistance Needed: +1 History of Present Illness: Pt with L THA, anterior approach    Subjective Data      Prior Functioning       Cognition  Cognition Arousal/Alertness: Awake/alert Behavior During Therapy: WFL for tasks assessed/performed Overall Cognitive Status: Within Functional Limits for tasks assessed    Mobility  Bed Mobility Overal bed mobility:  (not assessed, pt up with PT) Bed Mobility: Supine to Sit Transfers Overall transfer level: Modified independent Equipment used: Rolling walker (2 wheeled) Transfers: Sit to/from Stand    Exercises  Total Joint Exercises Ankle Circles/Pumps: AROM;Both;10 reps Heel Slides:  AROM;Strengthening;Left;10 reps Hip ABduction/ADduction: AROM;Strengthening;Left;10 reps Straight Leg Raises: AROM;Strengthening;Left;10 reps   Balance  End of Session OT - End of Session Equipment Utilized During Treatment: Rolling walker Activity Tolerance: Patient tolerated treatment well Patient left: in chair;with call bell/phone within reach Nurse Communication: Mobility status  GO    01/12/2014 Darrol Jump OTR/L Pager [phone] Office [phone]  Darrol Jump 01/12/2014, 8:40 AM

## 2014-01-12 NOTE — Progress Notes (Signed)
Physical Therapy Treatment Patient Details Name: Ryan [name] MRN: [id number] DOB: November 21, 1941 Today's Date: 01/12/2014 Time: 2778-2423 PT Time Calculation (min): 18 min  PT Assessment / Plan / Recommendation  History of Present Illness Pt with L THA, anterior approach   PT Comments   Pt cont's to move well.  Pt currently at mod I level for bed mobility & transfers, & supervision for ambulation.      Follow Up Recommendations  Home health PT     Does the patient have the potential to tolerate intense rehabilitation     Barriers to Discharge        Equipment Recommendations  Rolling walker with 5" wheels;3in1 (PT)    Recommendations for Other Services OT consult  Frequency 7X/week   Progress towards PT Goals Progress towards PT goals: Progressing toward goals  Plan Current plan remains appropriate    Precautions / Restrictions Precautions Precautions:  (no precautions) Restrictions LLE Weight Bearing: Weight bearing as tolerated   Pertinent Vitals/Pain 4/10 Lt hip.  RN notified of pt's request for pain medication & medicated.      Mobility  Bed Mobility Overal bed mobility: Modified Independent Bed Mobility: Supine to Sit Transfers Overall transfer level: Modified independent Equipment used: Rolling walker (2 wheeled) Transfers: Sit to/from Stand Ambulation/Gait Ambulation/Gait assistance: Supervision Ambulation Distance (Feet): 180 Feet Assistive device: Rolling walker (2 wheeled) Gait Pattern/deviations: Step-through pattern;Decreased stride length General Gait Details: Cont's to improve with fluidity & decreased antalgic gait.   Stairs: Yes Stairs assistance: Supervision Stair Management: Two rails;Step to pattern;Forwards Number of Stairs: 5 General stair comments: cues for sequencing.  Used bil rails to mimic doorframe    Exercises Total Joint Exercises Ankle Circles/Pumps: AROM;Both;10 reps Heel Slides: AROM;Strengthening;Left;10 reps Hip  ABduction/ADduction: AROM;Strengthening;Left;10 reps Straight Leg Raises: AROM;Strengthening;Left;10 reps     PT Goals (current goals can now be found in the care plan section) Acute Rehab PT Goals PT Goal Formulation: With patient Time For Goal Achievement: 01/17/14 Potential to Achieve Goals: Good  Visit Information  Last PT Received On: 01/12/14 Assistance Needed: +1 History of Present Illness: Pt with L THA, anterior approach    Subjective Data      Cognition  Cognition Arousal/Alertness: Awake/alert Behavior During Therapy: WFL for tasks assessed/performed Overall Cognitive Status: Within Functional Limits for tasks assessed    Balance     End of Session PT - End of Session Activity Tolerance: Patient tolerated treatment well Patient left:  (with OT) Nurse Communication: Mobility status   GP     Sena Hitch 01/12/2014, 8:29 AM   Sarajane Marek, PTA [phone] 01/12/2014

## 2014-01-13 DIAGNOSIS — Z471 Aftercare following joint replacement surgery: Secondary | ICD-10-CM | POA: Diagnosis not present

## 2014-01-13 DIAGNOSIS — I10 Essential (primary) hypertension: Secondary | ICD-10-CM | POA: Diagnosis not present

## 2014-01-13 DIAGNOSIS — I2510 Atherosclerotic heart disease of native coronary artery without angina pectoris: Secondary | ICD-10-CM | POA: Diagnosis not present

## 2014-01-13 DIAGNOSIS — IMO0001 Reserved for inherently not codable concepts without codable children: Secondary | ICD-10-CM | POA: Diagnosis not present

## 2014-01-13 DIAGNOSIS — Z96649 Presence of unspecified artificial hip joint: Secondary | ICD-10-CM | POA: Diagnosis not present

## 2014-01-13 DIAGNOSIS — I252 Old myocardial infarction: Secondary | ICD-10-CM | POA: Diagnosis not present

## 2014-01-16 DIAGNOSIS — I10 Essential (primary) hypertension: Secondary | ICD-10-CM | POA: Diagnosis not present

## 2014-01-16 DIAGNOSIS — I252 Old myocardial infarction: Secondary | ICD-10-CM | POA: Diagnosis not present

## 2014-01-16 DIAGNOSIS — IMO0001 Reserved for inherently not codable concepts without codable children: Secondary | ICD-10-CM | POA: Diagnosis not present

## 2014-01-16 DIAGNOSIS — I2510 Atherosclerotic heart disease of native coronary artery without angina pectoris: Secondary | ICD-10-CM | POA: Diagnosis not present

## 2014-01-16 DIAGNOSIS — Z96649 Presence of unspecified artificial hip joint: Secondary | ICD-10-CM | POA: Diagnosis not present

## 2014-01-16 DIAGNOSIS — Z471 Aftercare following joint replacement surgery: Secondary | ICD-10-CM | POA: Diagnosis not present

## 2014-01-18 DIAGNOSIS — Z96649 Presence of unspecified artificial hip joint: Secondary | ICD-10-CM | POA: Diagnosis not present

## 2014-01-18 DIAGNOSIS — IMO0001 Reserved for inherently not codable concepts without codable children: Secondary | ICD-10-CM | POA: Diagnosis not present

## 2014-01-18 DIAGNOSIS — I2510 Atherosclerotic heart disease of native coronary artery without angina pectoris: Secondary | ICD-10-CM | POA: Diagnosis not present

## 2014-01-18 DIAGNOSIS — I10 Essential (primary) hypertension: Secondary | ICD-10-CM | POA: Diagnosis not present

## 2014-01-18 DIAGNOSIS — I252 Old myocardial infarction: Secondary | ICD-10-CM | POA: Diagnosis not present

## 2014-01-18 DIAGNOSIS — Z471 Aftercare following joint replacement surgery: Secondary | ICD-10-CM | POA: Diagnosis not present

## 2014-01-19 DIAGNOSIS — I10 Essential (primary) hypertension: Secondary | ICD-10-CM | POA: Diagnosis not present

## 2014-01-19 DIAGNOSIS — I2510 Atherosclerotic heart disease of native coronary artery without angina pectoris: Secondary | ICD-10-CM | POA: Diagnosis not present

## 2014-01-19 DIAGNOSIS — IMO0001 Reserved for inherently not codable concepts without codable children: Secondary | ICD-10-CM | POA: Diagnosis not present

## 2014-01-19 DIAGNOSIS — Z96649 Presence of unspecified artificial hip joint: Secondary | ICD-10-CM | POA: Diagnosis not present

## 2014-01-19 DIAGNOSIS — I252 Old myocardial infarction: Secondary | ICD-10-CM | POA: Diagnosis not present

## 2014-01-19 DIAGNOSIS — Z471 Aftercare following joint replacement surgery: Secondary | ICD-10-CM | POA: Diagnosis not present

## 2014-01-22 DIAGNOSIS — I2510 Atherosclerotic heart disease of native coronary artery without angina pectoris: Secondary | ICD-10-CM | POA: Diagnosis not present

## 2014-01-22 DIAGNOSIS — Z96649 Presence of unspecified artificial hip joint: Secondary | ICD-10-CM | POA: Diagnosis not present

## 2014-01-22 DIAGNOSIS — I10 Essential (primary) hypertension: Secondary | ICD-10-CM | POA: Diagnosis not present

## 2014-01-22 DIAGNOSIS — Z471 Aftercare following joint replacement surgery: Secondary | ICD-10-CM | POA: Diagnosis not present

## 2014-01-22 DIAGNOSIS — IMO0001 Reserved for inherently not codable concepts without codable children: Secondary | ICD-10-CM | POA: Diagnosis not present

## 2014-01-22 DIAGNOSIS — I252 Old myocardial infarction: Secondary | ICD-10-CM | POA: Diagnosis not present

## 2014-01-23 DIAGNOSIS — M1610 Unilateral primary osteoarthritis, unspecified hip: Secondary | ICD-10-CM | POA: Diagnosis not present

## 2014-01-23 DIAGNOSIS — M169 Osteoarthritis of hip, unspecified: Secondary | ICD-10-CM | POA: Diagnosis not present

## 2014-01-24 DIAGNOSIS — I10 Essential (primary) hypertension: Secondary | ICD-10-CM | POA: Diagnosis not present

## 2014-01-24 DIAGNOSIS — I252 Old myocardial infarction: Secondary | ICD-10-CM | POA: Diagnosis not present

## 2014-01-24 DIAGNOSIS — IMO0001 Reserved for inherently not codable concepts without codable children: Secondary | ICD-10-CM | POA: Diagnosis not present

## 2014-01-24 DIAGNOSIS — I2510 Atherosclerotic heart disease of native coronary artery without angina pectoris: Secondary | ICD-10-CM | POA: Diagnosis not present

## 2014-01-24 DIAGNOSIS — Z471 Aftercare following joint replacement surgery: Secondary | ICD-10-CM | POA: Diagnosis not present

## 2014-01-24 DIAGNOSIS — Z96649 Presence of unspecified artificial hip joint: Secondary | ICD-10-CM | POA: Diagnosis not present

## 2014-01-29 DIAGNOSIS — Z96649 Presence of unspecified artificial hip joint: Secondary | ICD-10-CM | POA: Diagnosis not present

## 2014-01-29 DIAGNOSIS — I2510 Atherosclerotic heart disease of native coronary artery without angina pectoris: Secondary | ICD-10-CM | POA: Diagnosis not present

## 2014-01-29 DIAGNOSIS — Z471 Aftercare following joint replacement surgery: Secondary | ICD-10-CM | POA: Diagnosis not present

## 2014-01-29 DIAGNOSIS — IMO0001 Reserved for inherently not codable concepts without codable children: Secondary | ICD-10-CM | POA: Diagnosis not present

## 2014-01-29 DIAGNOSIS — I10 Essential (primary) hypertension: Secondary | ICD-10-CM | POA: Diagnosis not present

## 2014-01-29 DIAGNOSIS — I252 Old myocardial infarction: Secondary | ICD-10-CM | POA: Diagnosis not present

## 2014-01-30 DIAGNOSIS — Z96649 Presence of unspecified artificial hip joint: Secondary | ICD-10-CM | POA: Diagnosis not present

## 2014-01-30 DIAGNOSIS — Z471 Aftercare following joint replacement surgery: Secondary | ICD-10-CM | POA: Diagnosis not present

## 2014-01-30 DIAGNOSIS — I10 Essential (primary) hypertension: Secondary | ICD-10-CM | POA: Diagnosis not present

## 2014-01-30 DIAGNOSIS — I252 Old myocardial infarction: Secondary | ICD-10-CM | POA: Diagnosis not present

## 2014-01-30 DIAGNOSIS — IMO0001 Reserved for inherently not codable concepts without codable children: Secondary | ICD-10-CM | POA: Diagnosis not present

## 2014-01-30 DIAGNOSIS — I2510 Atherosclerotic heart disease of native coronary artery without angina pectoris: Secondary | ICD-10-CM | POA: Diagnosis not present

## 2014-02-01 DIAGNOSIS — I2510 Atherosclerotic heart disease of native coronary artery without angina pectoris: Secondary | ICD-10-CM | POA: Diagnosis not present

## 2014-02-01 DIAGNOSIS — I252 Old myocardial infarction: Secondary | ICD-10-CM | POA: Diagnosis not present

## 2014-02-01 DIAGNOSIS — IMO0001 Reserved for inherently not codable concepts without codable children: Secondary | ICD-10-CM | POA: Diagnosis not present

## 2014-02-01 DIAGNOSIS — Z96649 Presence of unspecified artificial hip joint: Secondary | ICD-10-CM | POA: Diagnosis not present

## 2014-02-01 DIAGNOSIS — I10 Essential (primary) hypertension: Secondary | ICD-10-CM | POA: Diagnosis not present

## 2014-02-01 DIAGNOSIS — Z471 Aftercare following joint replacement surgery: Secondary | ICD-10-CM | POA: Diagnosis not present

## 2014-03-06 ENCOUNTER — Telehealth: Payer: Self-pay | Admitting: Gastroenterology

## 2014-03-06 NOTE — Telephone Encounter (Signed)
Left message for pt to call back  °

## 2014-03-07 NOTE — Telephone Encounter (Signed)
Spoke with pt and he states his PCP told him his prostate was enlarged and he needed to have that checked out. Discussed with pt that he needed to see a urologist not his GI doctor. He verbalized understanding and states that he just got confused on the doctors.

## 2014-03-13 DIAGNOSIS — N139 Obstructive and reflux uropathy, unspecified: Secondary | ICD-10-CM | POA: Diagnosis not present

## 2014-03-13 DIAGNOSIS — K4090 Unilateral inguinal hernia, without obstruction or gangrene, not specified as recurrent: Secondary | ICD-10-CM | POA: Diagnosis not present

## 2014-03-13 DIAGNOSIS — N401 Enlarged prostate with lower urinary tract symptoms: Secondary | ICD-10-CM | POA: Diagnosis not present

## 2014-03-13 DIAGNOSIS — N138 Other obstructive and reflux uropathy: Secondary | ICD-10-CM | POA: Diagnosis not present

## 2014-03-13 DIAGNOSIS — R972 Elevated prostate specific antigen [PSA]: Secondary | ICD-10-CM | POA: Diagnosis not present

## 2014-04-23 DIAGNOSIS — M76899 Other specified enthesopathies of unspecified lower limb, excluding foot: Secondary | ICD-10-CM | POA: Diagnosis not present

## 2014-05-30 ENCOUNTER — Ambulatory Visit (INDEPENDENT_AMBULATORY_CARE_PROVIDER_SITE_OTHER): Payer: Medicare Other | Admitting: Cardiovascular Disease

## 2014-05-30 ENCOUNTER — Encounter: Payer: Self-pay | Admitting: Cardiovascular Disease

## 2014-05-30 VITALS — BP 136/80 | HR 62 | Ht 67.0 in | Wt 179.6 lb

## 2014-05-30 DIAGNOSIS — I10 Essential (primary) hypertension: Secondary | ICD-10-CM | POA: Diagnosis not present

## 2014-05-30 DIAGNOSIS — I2510 Atherosclerotic heart disease of native coronary artery without angina pectoris: Secondary | ICD-10-CM

## 2014-05-30 LAB — BASIC METABOLIC PANEL
BUN: 23 mg/dL (ref 6–23)
CHLORIDE: 107 meq/L (ref 96–112)
CO2: 25 mEq/L (ref 19–32)
Calcium: 9 mg/dL (ref 8.4–10.5)
Creatinine, Ser: 1.1 mg/dL (ref 0.4–1.5)
GFR: 70.51 mL/min (ref >60.00)
Glucose, Bld: 101 mg/dL — ABNORMAL HIGH (ref 70–99)
Potassium: 4.6 mEq/L (ref 3.5–5.1)
Sodium: 138 mEq/L (ref 135–145)

## 2014-05-30 LAB — LIPID PANEL
Cholesterol: 186 mg/dL (ref 0–200)
HDL: 39.9 mg/dL (ref >39.00)
LDL Cholesterol: 124 mg/dL — ABNORMAL HIGH (ref 0–99)
NonHDL: 146.1
TRIGLYCERIDES: 110 mg/dL (ref 0.0–149.0)
Total CHOL/HDL Ratio: 5
VLDL: 22 mg/dL (ref 0.0–40.0)

## 2014-05-30 LAB — HEPATIC FUNCTION PANEL
ALT: 10 U/L (ref 0–53)
AST: 19 U/L (ref 0–37)
Albumin: 3.8 g/dL (ref 3.5–5.2)
Alkaline Phosphatase: 49 U/L (ref 39–117)
BILIRUBIN DIRECT: 0 mg/dL (ref 0.0–0.3)
TOTAL PROTEIN: 6.9 g/dL (ref 6.0–8.3)
Total Bilirubin: 0.8 mg/dL (ref 0.2–1.2)

## 2014-05-30 NOTE — Assessment & Plan Note (Signed)
Stable. No angina.  Continue same meds.

## 2014-05-30 NOTE — Patient Instructions (Signed)
Your physician wants you to follow-up in: 1 year with Dr. Vilinda Boehringer will receive a reminder letter in the mail two months in advance. If you don't receive a letter, please call our office to schedule the follow-up appointment.  Your physician recommends that you have FASTING lipid profile: LIVER BMET  TODAY     Your physician recommends that you return for a FASTING lipid profile: liver,bmet in 1 year

## 2014-05-30 NOTE — Progress Notes (Signed)
Ryan [name] Date of Birth  1941/01/17       [facility]    [employer] [address], [place], [place]  [address]   [place], [place]  [address] [phone]     [phone]   Fax  [phone]    Fax [phone]  Problem List: 1. Coronary artery disease -  Cath 2010 - he has moderate irregularities in the LAD and circumflex. The first diagonal has a tight stenosis. His right coronary artery is chronically occluded with intact filling from the left system. 2. Hypertension 3. Hyperlipidemia   History of Present Illness:  I last saw him approximately 3-4 years ago. We performed a heart catheterization which revealed complete occlusion of his proximal right coronary artery with collateral filling from the left system. He has moderate diffuse irregularities in the  LAD and circumflex. He has  done well on medical therapy. He denies any chest pain or problems.  Up to  this week he was able to climb 2 flights of stairs without any difficulty.  He was walking 4 miles a day prior to his back problem.   He denies any episodes of chest pain or shortness of breath.  His main problem now is that of back pain. He has had 4 surgeries on his back so far and is scheduled have a repeat back surgery soon.  He's not able to exercise.  He's been sleeping in a chair.  He is in fairly excruciating pain. I discussed the case with Dr. Lorin Mercy.   May 29, 2013:   He is doing well from a cardiac standpoint.  He has had 2 back surgeries but still has lots of back and hip pain.    May 30, 2014:  Ryan [name] is doing well.  No angina.    Current Outpatient Prescriptions on File Prior to Visit  Medication Sig Dispense Refill  . amLODipine (NORVASC) 5 MG tablet Take 5 mg by mouth at bedtime.       Marland Kitchen aspirin EC 325 MG EC tablet Take 1 tablet (325 mg total) by mouth 2 (two) times daily after a meal.  60 tablet  0  . diclofenac sodium (VOLTAREN) 1 % GEL  Apply 2 g topically every 6 (six) hours as needed (for pain).      Marland Kitchen HYDROcodone-acetaminophen (NORCO) 5-325 MG per tablet Take 1-2 tablets by mouth every 6 (six) hours as needed for moderate pain or severe pain.  60 tablet  0  . INDOMETHACIN ER PO Take 75 mg by mouth as needed (for gout).       . silodosin (RAPAFLO) 8 MG CAPS capsule Take 8 mg by mouth daily with breakfast.       No current facility-administered medications on file prior to visit.    Allergies  Allergen Reactions  . Codeine Nausea And Vomiting         Past Medical History  Diagnosis Date  . Arthritis   . Myocardial infarction     55    dr Brigitte Pulse PCP          . Hyperlipidemia     takes Crestor daily  . Peripheral vascular disease   . Carotid artery occlusion   . Hypertension     takes Amlodipine daily  . Chronic back pain   . History of gout     has colchicine prn  . History of colon polyps   . Urinary frequency   .  Urinary urgency   . Enlarged prostate     takes Rapaflo daily  . History of kidney stones   . Hyperthyroidism     takes SYnthroid daily as instructed  . Cataract     Bil eyes/worse in left eye  . GERD (gastroesophageal reflux disease)     occasional    Past Surgical History  Procedure Laterality Date  . Femoral artery - popliteal artery bypass graft    . Joint replacement      shoulder  . Back surgery      5 times  . Lumbar laminectomy/decompression microdiscectomy  02/12/2012    Procedure: LUMBAR LAMINECTOMY/DECOMPRESSION MICRODISCECTOMY;  Surgeon: Floyce Stakes, MD;  Location: [facility];  Service: Neurosurgery;  Laterality: N/A;  Lumbar four-five laminectomy  . Appendectomy    . Cataract surgery      left eye  . Colonoscopy    . Big toe surgery    . Cystoscopy    . Lumbar laminectomy  01/06/2013    Procedure: MICRODISCECTOMY LUMBAR LAMINECTOMY;  Surgeon: Marybelle Killings, MD;  Location: [facility];  Service: Orthopedics;  Laterality: N/A;  L3-4 decompression  . Cardiac catheterization       2010    dr Acie Fredrickson  . Eye surgery    . Total hip arthroplasty Left 01/09/2014    DR Ninfa Linden  . Total hip arthroplasty Left 01/09/2014    Procedure: LEFT TOTAL HIP ARTHROPLASTY ANTERIOR APPROACH and Steroid Injection Right hip;  Surgeon: Mcarthur Rossetti, MD;  Location: [facility];  Service: Orthopedics;  Laterality: Left;  . Steriod injection Right 01/09/2014    Procedure: STEROID INJECTION;  Surgeon: Mcarthur Rossetti, MD;  Location: [facility];  Service: Orthopedics;  Laterality: Right;    History  Smoking status  . Former Smoker  . Types: Cigarettes  . Quit date: 02/04/1987  Smokeless tobacco  . Former Systems developer  . Types: Chew    Comment: quit 35+yrs ago    History  Alcohol Use No    Family History  Problem Relation Age of Onset  . Heart disease Father   . Heart disease Sister     Reviw of Systems:  Reviewed in the HPI.  All other systems are negative.  Physical Exam: Blood pressure 136/80, pulse 62, height 5\' 7"  (1.702 m), weight 179 lb 9.6 oz (81.466 kg). General: Well developed, well nourished, in no acute distress.  Head: Normocephalic, atraumatic, sclera non-icteric, mucus membranes are moist,   Neck: Supple. Carotids are 2 + without bruits. No JVD   Lungs: Clear   Heart: RR, normal S1, S2  Abdomen: Soft, non-tender, non-distended with normal bowel sounds.  Msk:  Strength and tone are normal   Extremities: No clubbing or cyanosis. No edema.  Distal pedal pulses are 2+ and equal   Neuro: CN II - XII intact.  Alert and oriented X 3.   Psych:  Normal   ECG:  Assessment / Plan:

## 2014-05-30 NOTE — Assessment & Plan Note (Addendum)
Bp is well controlled.    Continue amlodipine.

## 2014-06-01 ENCOUNTER — Other Ambulatory Visit: Payer: Self-pay | Admitting: *Deleted

## 2014-06-01 DIAGNOSIS — E7800 Pure hypercholesterolemia, unspecified: Secondary | ICD-10-CM

## 2014-06-01 MED ORDER — ATORVASTATIN CALCIUM 40 MG PO TABS: 40.0000 mg | ORAL_TABLET | Freq: Every day | ORAL | Status: DC

## 2014-07-10 DIAGNOSIS — E039 Hypothyroidism, unspecified: Secondary | ICD-10-CM | POA: Diagnosis not present

## 2014-07-10 DIAGNOSIS — E785 Hyperlipidemia, unspecified: Secondary | ICD-10-CM | POA: Diagnosis not present

## 2014-07-10 DIAGNOSIS — Z125 Encounter for screening for malignant neoplasm of prostate: Secondary | ICD-10-CM | POA: Diagnosis not present

## 2014-07-10 DIAGNOSIS — M109 Gout, unspecified: Secondary | ICD-10-CM | POA: Diagnosis not present

## 2014-07-10 DIAGNOSIS — I10 Essential (primary) hypertension: Secondary | ICD-10-CM | POA: Diagnosis not present

## 2014-07-10 DIAGNOSIS — R7301 Impaired fasting glucose: Secondary | ICD-10-CM | POA: Diagnosis not present

## 2014-07-17 DIAGNOSIS — R972 Elevated prostate specific antigen [PSA]: Secondary | ICD-10-CM | POA: Diagnosis not present

## 2014-07-17 DIAGNOSIS — E039 Hypothyroidism, unspecified: Secondary | ICD-10-CM | POA: Diagnosis not present

## 2014-07-17 DIAGNOSIS — E785 Hyperlipidemia, unspecified: Secondary | ICD-10-CM | POA: Diagnosis not present

## 2014-07-17 DIAGNOSIS — R7301 Impaired fasting glucose: Secondary | ICD-10-CM | POA: Diagnosis not present

## 2014-07-17 DIAGNOSIS — Z0000 Encounter for general adult medical examination without abnormal findings: Secondary | ICD-10-CM | POA: Diagnosis not present

## 2014-07-17 DIAGNOSIS — I6529 Occlusion and stenosis of unspecified carotid artery: Secondary | ICD-10-CM | POA: Diagnosis not present

## 2014-07-17 DIAGNOSIS — I10 Essential (primary) hypertension: Secondary | ICD-10-CM | POA: Diagnosis not present

## 2014-07-17 DIAGNOSIS — I2510 Atherosclerotic heart disease of native coronary artery without angina pectoris: Secondary | ICD-10-CM | POA: Diagnosis not present

## 2014-07-18 DIAGNOSIS — Z1212 Encounter for screening for malignant neoplasm of rectum: Secondary | ICD-10-CM | POA: Diagnosis not present

## 2014-08-13 ENCOUNTER — Other Ambulatory Visit: Payer: Self-pay | Admitting: *Deleted

## 2014-08-13 DIAGNOSIS — I739 Peripheral vascular disease, unspecified: Secondary | ICD-10-CM

## 2014-08-13 DIAGNOSIS — Z48812 Encounter for surgical aftercare following surgery on the circulatory system: Secondary | ICD-10-CM

## 2014-09-03 ENCOUNTER — Other Ambulatory Visit: Payer: Medicare Other

## 2014-09-04 ENCOUNTER — Ambulatory Visit: Payer: Medicare Other | Admitting: Vascular Surgery

## 2014-09-04 ENCOUNTER — Other Ambulatory Visit (HOSPITAL_COMMUNITY): Payer: Medicare Other

## 2014-09-04 ENCOUNTER — Encounter (HOSPITAL_COMMUNITY): Payer: Medicare Other

## 2014-09-17 ENCOUNTER — Other Ambulatory Visit (INDEPENDENT_AMBULATORY_CARE_PROVIDER_SITE_OTHER): Payer: Medicare Other | Admitting: *Deleted

## 2014-09-17 DIAGNOSIS — E7800 Pure hypercholesterolemia, unspecified: Secondary | ICD-10-CM

## 2014-09-17 LAB — LIPID PANEL
CHOL/HDL RATIO: 4
Cholesterol: 145 mg/dL (ref 0–200)
HDL: 39.3 mg/dL (ref >39.00)
LDL Cholesterol: 83 mg/dL (ref 0–99)
NONHDL: 105.7
Triglycerides: 115 mg/dL (ref 0.0–149.0)
VLDL: 23 mg/dL (ref 0.0–40.0)

## 2014-09-17 LAB — HEPATIC FUNCTION PANEL
ALBUMIN: 3.8 g/dL (ref 3.5–5.2)
ALT: 11 U/L (ref 0–53)
AST: 21 U/L (ref 0–37)
Alkaline Phosphatase: 43 U/L (ref 39–117)
BILIRUBIN DIRECT: 0 mg/dL (ref 0.0–0.3)
Total Bilirubin: 0.6 mg/dL (ref 0.2–1.2)
Total Protein: 6.8 g/dL (ref 6.0–8.3)

## 2014-09-17 LAB — BASIC METABOLIC PANEL
BUN: 14 mg/dL (ref 6–23)
CO2: 24 mEq/L (ref 19–32)
Calcium: 8.7 mg/dL (ref 8.4–10.5)
Chloride: 105 mEq/L (ref 96–112)
Creatinine, Ser: 1 mg/dL (ref 0.4–1.5)
GFR: 76.06 mL/min (ref >60.00)
Glucose, Bld: 122 mg/dL — ABNORMAL HIGH (ref 70–99)
POTASSIUM: 4 meq/L (ref 3.5–5.1)
Sodium: 137 mEq/L (ref 135–145)

## 2014-10-08 ENCOUNTER — Encounter: Payer: Self-pay | Admitting: Vascular Surgery

## 2014-10-09 ENCOUNTER — Encounter: Payer: Self-pay | Admitting: Vascular Surgery

## 2014-10-09 ENCOUNTER — Ambulatory Visit (INDEPENDENT_AMBULATORY_CARE_PROVIDER_SITE_OTHER): Payer: Medicare Other | Admitting: Vascular Surgery

## 2014-10-09 ENCOUNTER — Ambulatory Visit (HOSPITAL_COMMUNITY)
Admission: RE | Admit: 2014-10-09 | Discharge: 2014-10-09 | Disposition: A | Discharge status: Home / Self Care | Payer: Medicare Other | Source: Ambulatory Visit | Attending: Vascular Surgery | Admitting: Vascular Surgery

## 2014-10-09 ENCOUNTER — Ambulatory Visit (INDEPENDENT_AMBULATORY_CARE_PROVIDER_SITE_OTHER)
Admission: RE | Admit: 2014-10-09 | Discharge: 2014-10-09 | Disposition: A | Discharge status: Home / Self Care | Payer: Medicare Other | Source: Ambulatory Visit | Attending: Vascular Surgery | Admitting: Vascular Surgery

## 2014-10-09 VITALS — BP 177/97 | HR 68 | Ht 67.0 in | Wt 180.0 lb

## 2014-10-09 DIAGNOSIS — I6529 Occlusion and stenosis of unspecified carotid artery: Secondary | ICD-10-CM | POA: Diagnosis not present

## 2014-10-09 DIAGNOSIS — I739 Peripheral vascular disease, unspecified: Secondary | ICD-10-CM

## 2014-10-09 DIAGNOSIS — I70219 Atherosclerosis of native arteries of extremities with intermittent claudication, unspecified extremity: Secondary | ICD-10-CM | POA: Diagnosis not present

## 2014-10-09 DIAGNOSIS — Z48812 Encounter for surgical aftercare following surgery on the circulatory system: Secondary | ICD-10-CM | POA: Diagnosis not present

## 2014-10-09 DIAGNOSIS — I779 Disorder of arteries and arterioles, unspecified: Secondary | ICD-10-CM | POA: Insufficient documentation

## 2014-10-09 DIAGNOSIS — I2510 Atherosclerotic heart disease of native coronary artery without angina pectoris: Secondary | ICD-10-CM

## 2014-10-09 NOTE — Progress Notes (Signed)
Subjective:     Patient ID: ANTIONIO [name], male   DOB: Mar 17, 1941, 73 y.o.   MRN: [id number]  HPI this 73 year old male is seen in followup regarding his carotid occlusive disease and lower extremity occlusive disease. He underwent aortobifemoral bypass grafting by me in 1987 for aortic occlusion with severe buttocks thigh and hip claudication. His symptoms were almost completely resolved but remained mild. He has recently been having issues with bilateral hip and back symptoms and has had left hip replacement which improved his symptoms somewhat. He continues to have symptoms of hip and buttock discomfort with ambulation which is relieved by rest. He has no history of nonhealing ulcers or infection. He denies any neurologic symptoms such as lateralizing weakness, aphasia, amaurosis fugax, diplopia, blurred vision, or syncope. He has never had a CVA.  Past Medical History  Diagnosis Date  . Arthritis   . Myocardial infarction     44    dr Brigitte Pulse PCP          . Hyperlipidemia     takes Crestor daily  . Peripheral vascular disease   . Carotid artery occlusion   . Hypertension     takes Amlodipine daily  . Chronic back pain   . History of gout     has colchicine prn  . History of colon polyps   . Urinary frequency   . Urinary urgency   . Enlarged prostate     takes Rapaflo daily  . History of kidney stones   . Hyperthyroidism     takes SYnthroid daily as instructed  . Cataract     Bil eyes/worse in left eye  . GERD (gastroesophageal reflux disease)     occasional    History  Substance Use Topics  . Smoking status: Former Smoker    Types: Cigarettes    Quit date: 02/04/1987  . Smokeless tobacco: Former Systems developer    Types: Chew     Comment: quit 35+yrs ago  . Alcohol Use: No    Family History  Problem Relation Age of Onset  . Heart disease Father   . Heart attack Father   . Heart disease Sister   . Hypertension Mother   . Diabetes Son     Allergies  Allergen Reactions  .  Codeine Nausea And Vomiting         Current outpatient prescriptions:amLODipine (NORVASC) 5 MG tablet, Take 5 mg by mouth at bedtime. , Disp: , Rfl: ;  aspirin EC 325 MG EC tablet, Take 1 tablet (325 mg total) by mouth 2 (two) times daily after a meal., Disp: 60 tablet, Rfl: 0;  atorvastatin (LIPITOR) 40 MG tablet, Take 1 tablet (40 mg total) by mouth daily., Disp: 30 tablet, Rfl: 11;  colchicine 0.6 MG tablet, Take 0.6 mg by mouth daily., Disp: , Rfl:  diclofenac sodium (VOLTAREN) 1 % GEL, Apply 2 g topically every 6 (six) hours as needed (for pain)., Disp: , Rfl: ;  HYDROcodone-acetaminophen (NORCO) 5-325 MG per tablet, Take 1-2 tablets by mouth every 6 (six) hours as needed for moderate pain or severe pain., Disp: 60 tablet, Rfl: 0;  INDOMETHACIN ER PO, Take 75 mg by mouth as needed (for gout). , Disp: , Rfl:  silodosin (RAPAFLO) 8 MG CAPS capsule, Take 8 mg by mouth daily with breakfast., Disp: , Rfl:   BP 177/97  Pulse 68  Ht 5\' 7"  (1.702 m)  Wt 180 lb (81.647 kg)  BMI 28.19 kg/m2  SpO2 98%  Body mass  index is 28.19 kg/(m^2).          Review of Systems denies chest pain, dyspnea on exertion, PND, orthopnea, hemoptysis. Does have chronic back pain and hip pain. Has had multiple back operations as well as left hip surgery. Has history of remote myocardial infarction x2 in the 1970s. Other systems negative complete review of systems     Objective:   Physical Exam BP 177/97  Pulse 68  Ht 5\' 7"  (1.702 m)  Wt 180 lb (81.647 kg)  BMI 28.19 kg/m2  SpO2 98%  Gen.-alert and oriented x3 in no apparent distress HEENT normal for age Lungs no rhonchi or wheezing Cardiovascular regular rhythm no murmurs carotid pulses 3+ palpable no bruits audible Abdomen soft nontender no palpable masses Musculoskeletal free of  major deformities Skin clear -no rashes Neurologic normal Lower extremities 3+ femoral and dorsalis pedis pulses palpable bilaterally with no edema  Today I ordered a   order carotid duplex exam and lower extremity arterial study which I  reviewed and interpreted. Patient has a 70-80% left ICA stenosis with mild flow reduction on the right side. This is slightly worse than when we last saw him in 2013 in the vascular lab. ABIs bilaterally exceed 1.0 with triphasic flow.       Assessment:     Widely patent aortobifemoral graft with normal ABIs 28 years post aortobifemoral bypass grafting Patient continuing to have buttock and thigh discomfort-etiology unknown-if it is vascular it is due to small vessel disease and not amenable to any further treatment in the buttock and thigh area 70-80% left ICA stenosis-asymptomatic    Plan:     Return in 9 months for carotid duplex exam unless patient develops symptoms. No need for further vascular workup as lower extremity graft has been working nicely for many years

## 2014-10-10 NOTE — Addendum Note (Signed)
Addended by: Breckyn Troyer K on: 10/10/2014 09:17 AM   Modules accepted: Orders  

## 2014-10-30 DIAGNOSIS — Z23 Encounter for immunization: Secondary | ICD-10-CM | POA: Diagnosis not present

## 2014-12-17 DIAGNOSIS — M25561 Pain in right knee: Secondary | ICD-10-CM | POA: Diagnosis not present

## 2014-12-31 DIAGNOSIS — M25511 Pain in right shoulder: Secondary | ICD-10-CM | POA: Diagnosis not present

## 2014-12-31 DIAGNOSIS — M25561 Pain in right knee: Secondary | ICD-10-CM | POA: Diagnosis not present

## 2015-01-14 DIAGNOSIS — M109 Gout, unspecified: Secondary | ICD-10-CM | POA: Diagnosis not present

## 2015-01-14 DIAGNOSIS — Z6830 Body mass index (BMI) 30.0-30.9, adult: Secondary | ICD-10-CM | POA: Diagnosis not present

## 2015-01-14 DIAGNOSIS — I739 Peripheral vascular disease, unspecified: Secondary | ICD-10-CM | POA: Diagnosis not present

## 2015-01-14 DIAGNOSIS — I2510 Atherosclerotic heart disease of native coronary artery without angina pectoris: Secondary | ICD-10-CM | POA: Diagnosis not present

## 2015-01-14 DIAGNOSIS — I10 Essential (primary) hypertension: Secondary | ICD-10-CM | POA: Diagnosis not present

## 2015-01-14 DIAGNOSIS — E785 Hyperlipidemia, unspecified: Secondary | ICD-10-CM | POA: Diagnosis not present

## 2015-01-14 DIAGNOSIS — R7301 Impaired fasting glucose: Secondary | ICD-10-CM | POA: Diagnosis not present

## 2015-02-18 ENCOUNTER — Telehealth: Payer: Self-pay | Admitting: Cardiovascular Disease

## 2015-02-18 NOTE — Telephone Encounter (Signed)
Pt c/o Shortness Of Breath: STAT if SOB developed within the last 24 hours or pt is noticeably SOB on the phone  1. Are you currently SOB (can you hear that pt is SOB on the phone)? Not at the moment but he did this morning and when he walks any distance at all he give out of breath 2. How long have you been experiencing SOB? A couple of weeks and it is just getting worse 3. Are you SOB when sitting or when up moving around? Both.. 4.  Are you currently experiencing any other symptoms? When he has the spells his head feels funny... "Swimmy headed"

## 2015-02-18 NOTE — Telephone Encounter (Signed)
The appt. On Friday should be OK He should come to the ER if the pains worsen prior to the appt.

## 2015-02-18 NOTE — Telephone Encounter (Signed)
Chest pain that is described as a "chest pressure" with accompanying SOB over past two weeks, but worsening over past couple days. Patient states he is currently also having episodes of feeling "swimmy headed" and dizzy but that is when he is SOB. He describes this as "not getting enough of a breath". States his weight is "normal". Denies swelling or cough. BP 137/92-151/79. No recent medications or illness or accidents. HR is "regular to me but [name] said he felt a skipped beat". States he is compliant with his medications. Appointment scheduled with you for this Friday, 02/22/15. Please advise if you think he should come in sooner to be seen by flex PA. Routed to Dr. Acie Fredrickson.  Patient verbalizes understanding and agreement to go to Emergency Department should he have worsening or new sx prior to the Friday appointment.

## 2015-02-21 ENCOUNTER — Encounter: Payer: Self-pay | Admitting: Cardiovascular Disease

## 2015-02-22 ENCOUNTER — Ambulatory Visit (INDEPENDENT_AMBULATORY_CARE_PROVIDER_SITE_OTHER): Payer: Medicare Other | Admitting: Cardiovascular Disease

## 2015-02-22 ENCOUNTER — Encounter: Payer: Self-pay | Admitting: Cardiovascular Disease

## 2015-02-22 VITALS — BP 150/70 | HR 80 | Ht 67.0 in | Wt 184.8 lb

## 2015-02-22 DIAGNOSIS — I10 Essential (primary) hypertension: Secondary | ICD-10-CM

## 2015-02-22 DIAGNOSIS — I25119 Atherosclerotic heart disease of native coronary artery with unspecified angina pectoris: Secondary | ICD-10-CM | POA: Diagnosis not present

## 2015-02-22 DIAGNOSIS — I739 Peripheral vascular disease, unspecified: Secondary | ICD-10-CM | POA: Diagnosis not present

## 2015-02-22 MED ORDER — NITROGLYCERIN 0.4 MG SL SUBL: 0.4000 mg | SUBLINGUAL_TABLET | SUBLINGUAL | Status: DC | PRN

## 2015-02-22 NOTE — Patient Instructions (Signed)
Your physician has recommended you make the following change in your medication:  START Nitroglycerin as needed for chest pain - place 1 tab under your tongue for chest pain; if pain persists you may repeat 2 more times  Your physician has requested that you have a lexiscan myoview. For further information please visit [email or web address]. Please follow instruction sheet, as given.  Your physician recommends that you schedule a follow-up appointment in: 3 months with Dr. Acie Fredrickson Your physician recommends that you return for lab work in: 3 months on the day of or a few days before your office visit with Dr. Acie Fredrickson.  You will need to FAST for this appointment - nothing to eat or drink after midnight the night before except water.   We recommend that you decrease your intake of salt.  1. Use Morton's Salt substitute.   2. Use Ms. Dash   3. Mccormick;s salt free seasoning   4. Avoid processed meat - bacon ( including [place] bacon) , sausage, ham, hot dogs, Spam  5. Avoid [place] soup 6. Avoid prepared meals 7. Eat fresh or frozen foods that specifically do not have any added salt.

## 2015-02-22 NOTE — Progress Notes (Addendum)
Cardiology Office Note   Date:  02/22/2015   ID:  Ryan, [name] 12/11/1941, MRN [id number]  PCP:  Marton Redwood, MD  Cardiologist:   Acie Fredrickson [name], MD   Chief Complaint  Patient presents with  . Coronary Artery Disease    Problem List  1. Coronary artery disease - Cath 2010 - he has moderate irregularities in the LAD and circumflex. The first diagonal has a tight stenosis. His right coronary artery is chronically occluded with intact filling from the left system. 2. Hypertension 3. Hyperlipidemia 4. Peripheral vascular disease   History of Present Illness:  I last saw him approximately 3-4 years ago. We performed a heart catheterization which revealed complete occlusion of his proximal right coronary artery with collateral filling from the left system. He has moderate diffuse irregularities in the LAD and circumflex. He has done well on medical therapy. He denies any chest pain or problems.  Up to this week he was able to climb 2 flights of stairs without any difficulty. He was walking 4 miles a day prior to his back problem. He denies any episodes of chest pain or shortness of breath.  His main problem now is that of back pain. He has had 4 surgeries on his back so far and is scheduled have a repeat back surgery soon. He's not able to exercise. He's been sleeping in a chair. He is in fairly excruciating pain. I discussed the case with Dr. Lorin [name].   May 29, 2013:   He is doing well from a cardiac standpoint. He has had 2 back surgeries but still has lots of back and hip pain.   May 30, 2014:  [name] is doing well. No angina.   February 22, 2015; Ryan [name] is a 74 y.o. male who presents for follow up. He has been having some CP and DOE- right sided chest pain.  Occurs with exercise, better with rest.  Does not have NTG to take . Started 5-6 months ago, gradually has worsened.    Has not been taking his atorvastatin  - has abdominal pain .    Also has hip pain with walking -  Sees Dr. Kellie [name].  ABI showed  Mild blockage - not significant      Past Medical History  Diagnosis Date  . Arthritis   . Myocardial infarction     30    dr Brigitte Pulse PCP          . Hyperlipidemia     takes Crestor daily  . Peripheral vascular disease   . Carotid artery occlusion   . Hypertension     takes Amlodipine daily  . Chronic back pain   . History of gout     has colchicine prn  . History of colon polyps   . Urinary frequency   . Urinary urgency   . Enlarged prostate     takes Rapaflo daily  . History of kidney stones   . Hyperthyroidism     takes SYnthroid daily as instructed  . Cataract     Bil eyes/worse in left eye  . GERD (gastroesophageal reflux disease)     occasional    Past Surgical History  Procedure Laterality Date  . Femoral artery - popliteal artery bypass graft    . Joint replacement      shoulder  . Back surgery      5 times  . Lumbar laminectomy/decompression microdiscectomy  02/12/2012    Procedure: LUMBAR LAMINECTOMY/DECOMPRESSION MICRODISCECTOMY;  Surgeon: Ryan Stakes, MD;  Location: [facility] NEURO ORS;  Service: Neurosurgery;  Laterality: N/A;  Lumbar four-five laminectomy  . Appendectomy    . Cataract surgery      left eye  . Colonoscopy    . Big toe surgery    . Cystoscopy    . Lumbar laminectomy  01/06/2013    Procedure: MICRODISCECTOMY LUMBAR LAMINECTOMY;  Surgeon: Ryan Killings, MD;  Location: [facility];  Service: Orthopedics;  Laterality: N/A;  L3-4 decompression  . Cardiac catheterization      2010    dr Acie Fredrickson  . Eye surgery    . Total hip arthroplasty Left 01/09/2014    DR Ryan [name]  . Total hip arthroplasty Left 01/09/2014    Procedure: LEFT TOTAL HIP ARTHROPLASTY ANTERIOR APPROACH and Steroid Injection Right hip;  Surgeon: Ryan Rossetti, MD;  Location: [facility];  Service: Orthopedics;  Laterality: Left;  . Steriod injection Right 01/09/2014    Procedure: STEROID INJECTION;  Surgeon: Ryan Rossetti, MD;  Location: [facility];  Service: Orthopedics;  Laterality: Right;     Current Outpatient Prescriptions  Medication Sig Dispense Refill  . amLODipine (NORVASC) 5 MG tablet Take 5 mg by mouth at bedtime.     Marland Kitchen aspirin EC 325 MG EC tablet Take 1 tablet (325 mg total) by mouth 2 (two) times daily after a meal. (Patient taking differently: Take 81 mg by mouth daily. ) 60 tablet 0  . colchicine 0.6 MG tablet Take 0.6 mg by mouth daily.    . diclofenac sodium (VOLTAREN) 1 % GEL Apply 2 g topically every 6 (six) hours as needed (for pain).    Marland Kitchen HYDROcodone-acetaminophen (NORCO) 5-325 MG per tablet Take 1-2 tablets by mouth every 6 (six) hours as needed for moderate pain or severe pain. 60 tablet 0  . INDOMETHACIN ER PO Take 75 mg by mouth as needed (for gout).     . silodosin (RAPAFLO) 8 MG CAPS capsule Take 8 mg by mouth daily with breakfast.    . atorvastatin (LIPITOR) 40 MG tablet Take 1 tablet (40 mg total) by mouth daily. (Patient not taking: Reported on 02/22/2015) 30 tablet 11   No current facility-administered medications for this visit.    Allergies:   Codeine    Social History:  The patient  reports that he quit smoking about 28 years ago. His smoking use included Cigarettes. He has quit using smokeless tobacco. His smokeless tobacco use included Chew. He reports that he does not drink alcohol or use illicit drugs.   Family History:  The patient's family history includes Diabetes in his son; Heart attack in his father; Heart disease in his father and sister; Hypertension in his mother.    ROS:  Please see the history of present illness.    Review of Systems: Constitutional:  denies fever, chills, diaphoresis, appetite change and fatigue.  HEENT: denies photophobia, eye pain, redness, hearing loss, ear pain, congestion, sore throat, rhinorrhea, sneezing, neck pain, neck stiffness and tinnitus.  Respiratory: admits to SOB, DOE, cough, chest tightness,    Cardiovascular:  admits to chest pain,  Denies palpitations and leg swelling.  Gastrointestinal: denies nausea, vomiting, abdominal pain, diarrhea, constipation, blood in stool.  Genitourinary: denies dysuria, urgency, frequency, hematuria, flank pain and difficulty urinating.  Musculoskeletal: denies  myalgias, back pain, joint swelling, arthralgias and gait problem.   Skin: denies pallor, rash and wound.  Neurological: denies dizziness, seizures, syncope, weakness, light-headedness, numbness and headaches.   Hematological:  denies adenopathy, easy bruising, personal or family bleeding history.  Psychiatric/ Behavioral: denies suicidal ideation, mood changes, confusion, nervousness, sleep disturbance and agitation.       All other systems are reviewed and negative.    PHYSICAL EXAM: VS:  BP 150/70 mmHg  Pulse 80  Ht 5\' 7"  (1.702 m)  Wt 184 lb 12.8 oz (83.825 kg)  BMI 28.94 kg/m2 , BMI Body mass index is 28.94 kg/(m^2). GEN: Well nourished, well developed, in no acute distress HEENT: normal Neck: no JVD, carotid bruits, or masses Cardiac: RRR; no murmurs, rubs, or gallops,no edema  Respiratory:  clear to auscultation bilaterally, normal work of breathing GI: soft, nontender, nondistended, + BS MS: no deformity or atrophy Skin: warm and dry, no rash Neuro:  Strength and sensation are intact Psych: normal   EKG:  EKG is ordered today. The ekg ordered today demonstrates NSR at 80, rare PVCs.    Recent Labs: 09/17/2014: ALT 11; BUN 14; Creatinine 1.0; Potassium 4.0; Sodium 137    Lipid Panel    Component Value Date/Time   CHOL 145 09/17/2014 0854   TRIG 115.0 09/17/2014 0854   HDL 39.30 09/17/2014 0854   CHOLHDL 4 09/17/2014 0854   VLDL 23.0 09/17/2014 0854   LDLCALC 83 09/17/2014 0854      Wt Readings from Last 3 Encounters:  02/22/15 184 lb 12.8 oz (83.825 kg)  10/09/14 180 lb (81.647 kg)  05/30/14 179 lb 9.6 oz (81.466 kg)      Other studies Reviewed: Additional studies/  records that were reviewed today include: . Review of the above records demonstrates:    ASSESSMENT AND PLAN:  1. Coronary artery disease - Cath 2010 - he has moderate irregularities in the LAD. The first diagonal has a tight stenosis.   The circumflex gives off a small OM and is then occluded.   His right coronary artery is chronically occluded with intact filling from the left system.  He now presents with significant CP and DOE.  Symptoms are concerning for ischemia.  We will schedule him for a Lexiscan Myoview study.  2. Hypertension - his blood pressure is moderately elevated today. He's eating quite a bit of extra salt. We'll have him decrease his salt intake.  3. Hyperlipidemia - he has not tolerated the atorvastatin ,  Will check lipids in 3 months. Will consider    Current medicines are reviewed at length with the patient today.  The patient does not have concerns regarding medicines.  The following changes have been made:  no change   Disposition:   FU with me in 3 months.     Signed, [name], [name], MD  02/22/2015 8:39 AM    [address], [place], [place]  [address] Phone: [phone]; Fax: [phone]

## 2015-03-05 ENCOUNTER — Ambulatory Visit (HOSPITAL_COMMUNITY): Payer: Medicare Other | Attending: Cardiovascular Disease | Admitting: Radiology

## 2015-03-05 DIAGNOSIS — I25119 Atherosclerotic heart disease of native coronary artery with unspecified angina pectoris: Secondary | ICD-10-CM | POA: Diagnosis not present

## 2015-03-05 MED ORDER — REGADENOSON 0.4 MG/5ML IV SOLN
0.4000 mg | Freq: Once | INTRAVENOUS | Status: AC
  Administered 2015-03-05: 0.4 mg via INTRAVENOUS

## 2015-03-05 MED ORDER — TECHNETIUM TC 99M SESTAMIBI GENERIC - CARDIOLITE
33.0000 | Freq: Once | INTRAVENOUS | Status: AC | PRN
  Administered 2015-03-05: 33 via INTRAVENOUS

## 2015-03-05 MED ORDER — TECHNETIUM TC 99M SESTAMIBI GENERIC - CARDIOLITE
11.0000 | Freq: Once | INTRAVENOUS | Status: AC | PRN
  Administered 2015-03-05: 11 via INTRAVENOUS

## 2015-03-05 NOTE — Progress Notes (Signed)
[name] [address] [place], [name] [address] [phone]    Cardiology Nuclear Med Study  Ryan [name] is a 74 y.o. male     MRN : [id number]     DOB: 02/24/1941  Procedure Date: 03/05/2015  Nuclear Med Background Indication for Stress Test:  Evaluation for Ischemia History:  CAD, MPI 2005 (scar) EF 55% Cardiac Risk Factors: Carotid Disease and Hypertension  Symptoms:  Chest Pain and DOE   Nuclear Pre-Procedure Caffeine/Decaff Intake:  None NPO After: 7:00pm   Lungs:  clear O2 Sat: 97% on room air. IV 0.9% NS with Angio Cath:  22g  IV Site: R Hand  IV Started by:  Matilde Haymaker, RN  Chest Size (in):  42 Cup Size: n/a  Height: 5\' 7"  (1.702 m)  Weight:  182 lb (82.555 kg)  BMI:  Body mass index is 28.5 kg/(m^2). Tech Comments:  n/a    Nuclear Med Study 1 or 2 day study: 1 day  Stress Test Type:  Lexiscan  Reading MD: n/a  Order Authorizing Provider:  Natale Lay  Resting Radionuclide: Technetium 74m Sestamibi  Resting Radionuclide Dose: 11.0 mCi   Stress Radionuclide:  Technetium 34m Sestamibi  Stress Radionuclide Dose: 33.0 mCi           Stress Protocol Rest HR: 72 Stress HR: 94  Rest BP: 148/76 Stress BP: 167/80  Exercise Time (min): n/a METS: n/a   Predicted Max HR: 147 bpm % Max HR: 63.95 bpm Rate Pressure Product: 16638   Dose of Adenosine (mg):  n/a Dose of Lexiscan: 0.4 mg  Dose of Atropine (mg): n/a Dose of Dobutamine: n/a mcg/kg/min (at max HR)  Stress Test Technologist: Glade Lloyd, BS-ES  Nuclear Technologist:  Earl Many, CNMT     Rest Procedure:  Myocardial perfusion imaging was performed at rest 45 minutes following the intravenous administration of Technetium 88m Sestamibi. Rest ECG: NSR - Normal EKG  Stress Procedure:  The patient received IV Lexiscan 0.4 mg over 15-seconds.  Technetium 52m Sestamibi injected at 30-seconds.  Quantitative spect images were obtained after a 45 minute delay.   During the infusion of Lexiscan the patient complained of SOB, chest pain and feeling funny.  These symptoms began to resolve in recovery.  Stress ECG: There are scattered PVCs.  QPS Raw Data Images:  Normal; no motion artifact; normal heart/lung ratio. Stress Images:  Severe fixed inferolateral defect Rest Images:  Severe fixed inferolateral defect Subtraction (SDS):  3 Transient Ischemic Dilatation (Normal <1.22):  0.94 Lung/Heart Ratio (Normal <0.45):  0.34  Quantitative Gated Spect Images QGS EDV:  90 ml QGS ESV:  46 ml  Impression Exercise Capacity:  Lexiscan with no exercise. BP Response:  Normal blood pressure response. Clinical Symptoms:  No significant symptoms noted. ECG Impression:  There are scattered PVCs. Comparison with Prior Nuclear Study: No images to compare  Overall Impression:  Intermediate risk stress nuclear study with large, severe intensity fixed inferior and inferolateral perfusion defect suggestive of scar.  LV Ejection Fraction: 49%.  LV Wall Motion:  Inferior and inferolateral akinesis   Pixie Casino, MD, [facility] Board Certified in Nuclear Cardiology Attending Cardiologist [place]

## 2015-03-05 NOTE — Progress Notes (Deleted)
  [place] [address] [place], [place] [address] [phone]    Cardiology Nuclear Med Study  Ryan [name] is a 74 y.o. male     MRN : [id number]     DOB: 1941/06/03  Procedure Date: 03/05/2015  Nuclear Med Background Indication for Stress Test:  Evaluation for Ischemia History:  {CHL HISTORY STRESS NIDP:82423} Cardiac Risk Factors: Carotid Disease and Hypertension  Symptoms:  {CHL SYMPTOMS STRESS TEST:21021013}   Nuclear Pre-Procedure Caffeine/Decaff Intake:  None NPO After: 7:00pm   Lungs:  {Exam; lungs brief:12271} O2 Sat: ***% on {Exam; oxygen delivery:30093}. IV 0.9% NS with Angio Cath:  22g  IV Site: R Hand  IV Started by:  Matilde Haymaker, RN  Chest Size (in):  42 Cup Size: n/a  Height: 5\' 7"  (1.702 m)  Weight:  182 lb (82.555 kg)  BMI:  Body mass index is 28.5 kg/(m^2). Tech Comments:  n/a    Nuclear Med Study 1 or 2 day study: 1 day  Stress Test Type:  {CHL STRESS TEST NTIR:44315400}  Reading MD: n/a  Order Authorizing Provider:  Natale Lay  Resting Radionuclide: Technetium 55m Sestamibi  Resting Radionuclide Dose: *** mCi   Stress Radionuclide:  Technetium 3m Sestamibi  Stress Radionuclide Dose: *** mCi           Stress Protocol Rest HR: *** Stress HR: ***  Rest BP: *** Stress BP: ***  Exercise Time (min): {NA AND WILDCARD:21589} METS: {NA AND QQPYPPJK:93267}           Dose of Adenosine (mg):  {NA AND TIWPYKDX:83382} Dose of Lexiscan: {CHL CARD WILDCARD AND 0.4:21590} mg  Dose of Atropine (mg): {NA AND NKNLZJQB:34193} Dose of Dobutamine: {NA AND WILDCARD:21589} mcg/kg/min (at max HR)  Stress Test Technologist: {CHL LB STRESS TEST TECHNOLOGIST:21021024}  Nuclear Technologist:  {CHL LB NUCLEAR TECHNOLOGIST:21021025}     Rest Procedure:  Myocardial perfusion imaging was performed at rest 45 minutes following the intravenous administration of Technetium 95m Sestamibi. Rest ECG: {CHL REST  XTK:24097}  Stress Procedure:  {CHL STRESS PROCEDURE NUCLEAR:21021028} Stress ECG: {CHL CAR STRESS ECG:21561}  QPS Raw Data Images:  {CHL RAW DATA IMAGES NUC:21021029} Stress Images:  {CHL STRESS IMAGES NUC:21021030} Rest Images:  {CHL REST IMAGES NUC:21021031} Subtraction (SDS):  {CHL SUBTRACTION (SDS) NUC:21021032} Transient Ischemic Dilatation (Normal <1.22):  *** Lung/Heart Ratio (Normal <0.45):  ***  Quantitative Gated Spect Images QGS EDV:  *** ml QGS ESV:  *** ml  Impression Exercise Capacity:  {CHL EXERCISE CAPACITY NUC:21021037} BP Response:  {CHL BP RESPONSE NUC:21021038} Clinical Symptoms:  {CHL CLINICAL SYMPTOMS NUC:21021039} ECG Impression:  {CHL ECG IMPRESSION NUC:21021040} Comparison with Prior Nuclear Study: No previous nuclear study   Overall Impression:  {CHL OVERALL IMPRESSION NUC:21021041}  LV Ejection Fraction: {CHL CARD STUDY NOT GATED:21592:o}.  LV Wall Motion:  {CHL CARD QGS:21591:o}

## 2015-03-12 ENCOUNTER — Telehealth: Payer: Self-pay | Admitting: Nurse Practitioner

## 2015-03-12 NOTE — Telephone Encounter (Signed)
In follow-up of patient's chart, I found that patient did not receive call from 3/16 myoview.  I reviewed Dr. Elmarie Shiley comments from the test with patient and advised him to call us if chest pain episodes occur more frequently.  I advised patient to call 911 or go to ED at [facility] if at anytime he has severe/excruciating pain or if he become uncomfortable with the frequency/duration of his symptoms.  Patient verbalized understanding and agreement.

## 2015-03-20 ENCOUNTER — Telehealth: Payer: Self-pay | Admitting: Cardiovascular Disease

## 2015-03-20 ENCOUNTER — Emergency Department (HOSPITAL_COMMUNITY): Payer: Medicare Other

## 2015-03-20 ENCOUNTER — Encounter (HOSPITAL_COMMUNITY): Payer: Self-pay | Admitting: Emergency Medicine

## 2015-03-20 ENCOUNTER — Inpatient Hospital Stay (HOSPITAL_COMMUNITY)
Admission: EM | Admit: 2015-03-20 | Discharge: 2015-03-21 | DRG: 302 | Disposition: A | Discharge status: Home / Self Care | Payer: Medicare Other | Attending: Cardiovascular Disease | Admitting: Cardiovascular Disease

## 2015-03-20 DIAGNOSIS — Z79899 Other long term (current) drug therapy: Secondary | ICD-10-CM

## 2015-03-20 DIAGNOSIS — I739 Peripheral vascular disease, unspecified: Secondary | ICD-10-CM | POA: Diagnosis present

## 2015-03-20 DIAGNOSIS — Z87891 Personal history of nicotine dependence: Secondary | ICD-10-CM

## 2015-03-20 DIAGNOSIS — I2699 Other pulmonary embolism without acute cor pulmonale: Secondary | ICD-10-CM

## 2015-03-20 DIAGNOSIS — E785 Hyperlipidemia, unspecified: Secondary | ICD-10-CM | POA: Diagnosis not present

## 2015-03-20 DIAGNOSIS — I10 Essential (primary) hypertension: Secondary | ICD-10-CM | POA: Diagnosis not present

## 2015-03-20 DIAGNOSIS — I2582 Chronic total occlusion of coronary artery: Secondary | ICD-10-CM | POA: Diagnosis present

## 2015-03-20 DIAGNOSIS — K219 Gastro-esophageal reflux disease without esophagitis: Secondary | ICD-10-CM | POA: Diagnosis present

## 2015-03-20 DIAGNOSIS — I200 Unstable angina: Secondary | ICD-10-CM | POA: Diagnosis not present

## 2015-03-20 DIAGNOSIS — I252 Old myocardial infarction: Secondary | ICD-10-CM

## 2015-03-20 DIAGNOSIS — M549 Dorsalgia, unspecified: Secondary | ICD-10-CM | POA: Diagnosis present

## 2015-03-20 DIAGNOSIS — Z96642 Presence of left artificial hip joint: Secondary | ICD-10-CM | POA: Diagnosis present

## 2015-03-20 DIAGNOSIS — R0602 Shortness of breath: Secondary | ICD-10-CM | POA: Diagnosis not present

## 2015-03-20 DIAGNOSIS — M1990 Unspecified osteoarthritis, unspecified site: Secondary | ICD-10-CM | POA: Diagnosis present

## 2015-03-20 DIAGNOSIS — G8929 Other chronic pain: Secondary | ICD-10-CM | POA: Diagnosis present

## 2015-03-20 DIAGNOSIS — R079 Chest pain, unspecified: Secondary | ICD-10-CM | POA: Diagnosis not present

## 2015-03-20 DIAGNOSIS — I2510 Atherosclerotic heart disease of native coronary artery without angina pectoris: Secondary | ICD-10-CM | POA: Diagnosis present

## 2015-03-20 DIAGNOSIS — R0789 Other chest pain: Secondary | ICD-10-CM | POA: Diagnosis present

## 2015-03-20 DIAGNOSIS — I25110 Atherosclerotic heart disease of native coronary artery with unstable angina pectoris: Secondary | ICD-10-CM | POA: Diagnosis not present

## 2015-03-20 DIAGNOSIS — E0590 Thyrotoxicosis, unspecified without thyrotoxic crisis or storm: Secondary | ICD-10-CM | POA: Diagnosis present

## 2015-03-20 DIAGNOSIS — N400 Enlarged prostate without lower urinary tract symptoms: Secondary | ICD-10-CM | POA: Diagnosis present

## 2015-03-20 DIAGNOSIS — M109 Gout, unspecified: Secondary | ICD-10-CM | POA: Diagnosis present

## 2015-03-20 DIAGNOSIS — H269 Unspecified cataract: Secondary | ICD-10-CM | POA: Diagnosis present

## 2015-03-20 HISTORY — DX: Other pulmonary embolism without acute cor pulmonale: I26.99

## 2015-03-20 LAB — CBC WITH DIFFERENTIAL/PLATELET
BASOS ABS: 0 10*3/uL (ref 0.0–0.1)
Basophils Relative: 0 % (ref 0–1)
EOS PCT: 1 % (ref 0–5)
Eosinophils Absolute: 0.1 10*3/uL (ref 0.0–0.7)
HEMATOCRIT: 45.8 % (ref 39.0–52.0)
HEMOGLOBIN: 15.2 g/dL (ref 13.0–17.0)
Lymphocytes Relative: 29 % (ref 12–46)
Lymphs Abs: 2.7 10*3/uL (ref 0.7–4.0)
MCH: 31.3 pg (ref 26.0–34.0)
MCHC: 33.2 g/dL (ref 30.0–36.0)
MCV: 94.2 fL (ref 78.0–100.0)
Monocytes Absolute: 0.7 10*3/uL (ref 0.1–1.0)
Monocytes Relative: 7 % (ref 3–12)
Neutro Abs: 5.7 10*3/uL (ref 1.7–7.7)
Neutrophils Relative %: 63 % (ref 43–77)
Platelets: 309 10*3/uL (ref 150–400)
RBC: 4.86 MIL/uL (ref 4.22–5.81)
RDW: 12.9 % (ref 11.5–15.5)
WBC: 9.1 10*3/uL (ref 4.0–10.5)

## 2015-03-20 LAB — COMPREHENSIVE METABOLIC PANEL
ALT: 23 U/L (ref 0–53)
AST: 28 U/L (ref 0–37)
Albumin: 3.9 g/dL (ref 3.5–5.2)
Alkaline Phosphatase: 55 U/L (ref 39–117)
Anion gap: 4 — ABNORMAL LOW (ref 5–15)
BUN: 10 mg/dL (ref 6–23)
CALCIUM: 9 mg/dL (ref 8.4–10.5)
CHLORIDE: 107 mmol/L (ref 96–112)
CO2: 29 mmol/L (ref 19–32)
Creatinine, Ser: 1.09 mg/dL (ref 0.50–1.35)
GFR calc non Af Amer: 65 mL/min — ABNORMAL LOW (ref >90)
GFR, EST AFRICAN AMERICAN: 76 mL/min — AB (ref >90)
Glucose, Bld: 112 mg/dL — ABNORMAL HIGH (ref 70–99)
Potassium: 3.9 mmol/L (ref 3.5–5.1)
SODIUM: 140 mmol/L (ref 135–145)
Total Bilirubin: 0.7 mg/dL (ref 0.3–1.2)
Total Protein: 7.3 g/dL (ref 6.0–8.3)

## 2015-03-20 LAB — CBC
HCT: 42.6 % (ref 39.0–52.0)
Hemoglobin: 14.4 g/dL (ref 13.0–17.0)
MCH: 31.5 pg (ref 26.0–34.0)
MCHC: 33.8 g/dL (ref 30.0–36.0)
MCV: 93.2 fL (ref 78.0–100.0)
PLATELETS: 273 10*3/uL (ref 150–400)
RBC: 4.57 MIL/uL (ref 4.22–5.81)
RDW: 12.8 % (ref 11.5–15.5)
WBC: 8.4 10*3/uL (ref 4.0–10.5)

## 2015-03-20 LAB — I-STAT TROPONIN, ED: Troponin i, poc: 0.03 ng/mL (ref 0.00–0.08)

## 2015-03-20 LAB — D-DIMER, QUANTITATIVE: D-Dimer, Quant: 2.14 ug/mL-FEU — ABNORMAL HIGH (ref 0.00–0.48)

## 2015-03-20 LAB — TSH: TSH: 6.399 u[IU]/mL — AB (ref 0.350–4.500)

## 2015-03-20 MED ORDER — ACETAMINOPHEN 325 MG PO TABS: 650.0000 mg | ORAL_TABLET | ORAL | Status: DC | PRN

## 2015-03-20 MED ORDER — ATORVASTATIN CALCIUM 40 MG PO TABS
40.0000 mg | ORAL_TABLET | Freq: Every day | ORAL | Status: DC
  Administered 2015-03-20: 40 mg via ORAL
  Filled 2015-03-20 (×2): qty 1

## 2015-03-20 MED ORDER — METOPROLOL TARTRATE 12.5 MG HALF TABLET
12.5000 mg | ORAL_TABLET | Freq: Two times a day (BID) | ORAL | Status: DC
Start: 2015-03-20 — End: 2015-03-21
  Administered 2015-03-20 – 2015-03-21 (×2): 12.5 mg via ORAL
  Filled 2015-03-20 (×2): qty 1

## 2015-03-20 MED ORDER — AMLODIPINE BESYLATE 5 MG PO TABS
5.0000 mg | ORAL_TABLET | Freq: Every day | ORAL | Status: DC
  Administered 2015-03-20: 5 mg via ORAL
  Filled 2015-03-20: qty 1

## 2015-03-20 MED ORDER — ONDANSETRON HCL 4 MG/2ML IJ SOLN: 4.0000 mg | Freq: Four times a day (QID) | INTRAMUSCULAR | Status: DC | PRN

## 2015-03-20 MED ORDER — HEPARIN (PORCINE) IN NACL 100-0.45 UNIT/ML-% IJ SOLN
1250.0000 [IU]/h | INTRAMUSCULAR | Status: DC
  Filled 2015-03-20 (×2): qty 250

## 2015-03-20 MED ORDER — TAMSULOSIN HCL 0.4 MG PO CAPS
0.4000 mg | ORAL_CAPSULE | Freq: Every day | ORAL | Status: DC
  Administered 2015-03-20: 0.4 mg via ORAL
  Filled 2015-03-20 (×2): qty 1

## 2015-03-20 MED ORDER — ASPIRIN 81 MG PO CHEW
324.0000 mg | CHEWABLE_TABLET | Freq: Once | ORAL | Status: AC
  Administered 2015-03-20: 324 mg via ORAL
  Filled 2015-03-20: qty 4

## 2015-03-20 MED ORDER — HEPARIN BOLUS VIA INFUSION
4000.0000 [IU] | Freq: Once | INTRAVENOUS | Status: AC
  Administered 2015-03-20: 4000 [IU] via INTRAVENOUS
  Filled 2015-03-20: qty 4000

## 2015-03-20 MED ORDER — SODIUM CHLORIDE 0.9 % IV SOLN
1.0000 mL/kg/h | INTRAVENOUS | Status: DC
  Administered 2015-03-20: 1 mL/kg/h via INTRAVENOUS

## 2015-03-20 MED ORDER — ASPIRIN 81 MG PO CHEW
81.0000 mg | CHEWABLE_TABLET | ORAL | Status: AC
  Administered 2015-03-21: 81 mg via ORAL
  Filled 2015-03-20: qty 1

## 2015-03-20 MED ORDER — SODIUM CHLORIDE 0.9 % IV SOLN: 250.0000 mL | INTRAVENOUS | Status: DC | PRN

## 2015-03-20 MED ORDER — SODIUM CHLORIDE 0.9 % IJ SOLN
3.0000 mL | Freq: Two times a day (BID) | INTRAMUSCULAR | Status: DC
  Administered 2015-03-20 – 2015-03-21 (×2): 3 mL via INTRAVENOUS

## 2015-03-20 MED ORDER — ASPIRIN EC 81 MG PO TBEC: 81.0000 mg | DELAYED_RELEASE_TABLET | Freq: Every day | ORAL | Status: DC

## 2015-03-20 MED ORDER — SODIUM CHLORIDE 0.9 % IJ SOLN: 3.0000 mL | INTRAMUSCULAR | Status: DC | PRN

## 2015-03-20 MED ORDER — NITROGLYCERIN 0.4 MG SL SUBL: 0.4000 mg | SUBLINGUAL_TABLET | SUBLINGUAL | Status: DC | PRN

## 2015-03-20 NOTE — Telephone Encounter (Signed)
Pt called with c/o of CP radiating down his arm, shoulder, and through to his back.  When he answered the phone he was SOB and handed phone to wife to talk.  Wife stated this has been happening off and on for the past few weeks but it is getting worse each day. He woke up in a "spell" this morning about 3 AM and it is is triggered by walking and most movement.  Pt is SOB and dizzy, had not taken any Nitroglycerin. Pt encouraged to take Nitro, which was not relived by one dose and pt was still very SOB and distressed.  Instructed pt wife to call 911 or take pt to emergency room immediately and as he needs to be evaluated. Wife agreed and stated she would take him.  Instructed pt could take another Nitro in 5 minutes from time of first one,in route to hospital, if pain not relieved.  Pt wife expressed understanding.

## 2015-03-20 NOTE — ED Notes (Signed)
Heparin verified with Maudie Mercury, RN

## 2015-03-20 NOTE — Progress Notes (Signed)
ANTICOAGULATION CONSULT NOTE - Initial Consult  Pharmacy Consult for heparin Indication: chest pain/ACS  Allergies  Allergen Reactions  . Codeine Nausea And Vomiting         Patient Measurements: Height: 5\' 6"  (167.6 cm) Weight: 180 lb (81.647 kg) IBW/kg (Calculated) : 63.8   Vital Signs: Temp: 97.7 F (36.5 C) (03/30 0935) Temp Source: Oral (03/30 0935) BP: 149/72 mmHg (03/30 1200) Pulse Rate: 60 (03/30 1200)  Labs:  Recent Labs  03/20/15 0955  HGB 15.2  HCT 45.8  PLT 309  CREATININE 1.09    Estimated Creatinine Clearance: 60.5 mL/min (by C-G formula based on Cr of 1.09).   Medical History: Past Medical History  Diagnosis Date  . Arthritis   . Myocardial infarction     38    dr Brigitte Pulse PCP          . Hyperlipidemia     takes Crestor daily  . Peripheral vascular disease   . Carotid artery occlusion   . Hypertension     takes Amlodipine daily  . Chronic back pain   . History of gout     has colchicine prn  . History of colon polyps   . Urinary frequency   . Urinary urgency   . Enlarged prostate     takes Rapaflo daily  . History of kidney stones   . Hyperthyroidism     takes SYnthroid daily as instructed  . Cataract     Bil eyes/worse in left eye  . GERD (gastroesophageal reflux disease)     occasional    Assessment: 74 yo M with R sided CP into R arms x months, worse last night with SOB.  Pharmacy consulted to dose heparin for ACS.  No anticoagulants PTA.  Wt 81.6 kg, CBC WNL, creat 1.09   Goal of Therapy:  Heparin level 0.3-0.7 units/ml Monitor platelets by anticoagulation protocol: Yes   Plan:  Give 4000 units bolus x 1 Start heparin infusion at 1000 units/hr Check anti-Xa level in 8 hours and daily while on heparin Continue to monitor H&H and platelets  Eudelia Bunch, Pharm.D. [phone] 03/20/2015 2:01 PM

## 2015-03-20 NOTE — ED Notes (Signed)
Attempted report x1. 

## 2015-03-20 NOTE — ED Provider Notes (Signed)
CSN: [id number]     Arrival date & time 03/20/15  2122 History   First MD Initiated Contact with Patient 03/20/15 0935     Chief Complaint  Patient presents with  . Chest Pain     (Consider location/radiation/quality/duration/timing/severity/associated sxs/prior Treatment) HPI Comments: Patient with h/o MI x 2, fem-pop bypass -- presents with episode of right-sided chest pain with radiation to right shoulder blade/right arm nausea, and shortness of breath onset 3 AM. Patient awoke from sleep with the symptoms. This is unchanged with nitroglycerin 1. They were waxing and waning and are now nearly resolved. Patient called his cardiologist and was told to come to the emergency department. Patient has had several similar, however milder episodes, over the past 4 weeks. He was seen by his heart doctor who ordered a Myoview. This was performed 2 weeks ago and was intermediate risk. Patient last took aspirin last night.  Cath from 2010:  CONCLUSION: Moderate diffuse coronary artery disease. He has 2 known myocardial infarctions, and now we have been able to identify that these were due to an occlusion of the right coronary artery and the distal left circumflex artery. He has moderate lesions elsewhere and none of these are severe enough to warrant revascularization. He has well-preserved left ventricular systolic function. 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 There is no evidence of thrombus.   The history is provided by the patient and medical records.    Past Medical History  Diagnosis Date  . Arthritis   . Myocardial infarction     79    dr Brigitte Pulse PCP          . Hyperlipidemia     takes Crestor daily  . Peripheral vascular disease   . Carotid artery occlusion   . Hypertension     takes Amlodipine daily  . Chronic back pain   . History of gout     has colchicine prn  . History of colon polyps   . Urinary frequency   . Urinary urgency   . Enlarged prostate    takes Rapaflo daily  . History of kidney stones   . Hyperthyroidism     takes SYnthroid daily as instructed  . Cataract     Bil eyes/worse in left eye  . GERD (gastroesophageal reflux disease)     occasional   Past Surgical History  Procedure Laterality Date  . Femoral artery - popliteal artery bypass graft    . Joint replacement      shoulder  . Back surgery      5 times  . Lumbar laminectomy/decompression microdiscectomy  02/12/2012    Procedure: LUMBAR LAMINECTOMY/DECOMPRESSION MICRODISCECTOMY;  Surgeon: Floyce Stakes, MD;  Location: [facility];  Service: Neurosurgery;  Laterality: N/A;  Lumbar four-five laminectomy  . Appendectomy    . Cataract surgery      left eye  . Colonoscopy    . Big toe surgery    . Cystoscopy    . Lumbar laminectomy  01/06/2013    Procedure: MICRODISCECTOMY LUMBAR LAMINECTOMY;  Surgeon: Marybelle Killings, MD;  Location: [facility];  Service: Orthopedics;  Laterality: N/A;  L3-4 decompression  . Cardiac catheterization      2010    dr Acie Fredrickson  . Eye surgery    . Total hip arthroplasty Left 01/09/2014    DR Ninfa Linden  . Total hip arthroplasty Left 01/09/2014    Procedure: LEFT TOTAL HIP ARTHROPLASTY ANTERIOR APPROACH and Steroid Injection Right hip;  Surgeon: Lind Guest  Ninfa Linden, MD;  Location: [place];  Service: Orthopedics;  Laterality: Left;  . Steriod injection Right 01/09/2014    Procedure: STEROID INJECTION;  Surgeon: Mcarthur Rossetti, MD;  Location: [facility];  Service: Orthopedics;  Laterality: Right;   Family History  Problem Relation Age of Onset  . Heart disease Father   . Heart attack Father   . Heart disease Sister   . Hypertension Mother   . Diabetes Son    History  Substance Use Topics  . Smoking status: Former Smoker    Types: Cigarettes    Quit date: 02/04/1987  . Smokeless tobacco: Former Systems developer    Types: Chew     Comment: quit 35+yrs ago  . Alcohol Use: No    Review of Systems  Constitutional: Negative for fever and  diaphoresis.  Eyes: Negative for redness.  Respiratory: Positive for shortness of breath. Negative for cough.   Cardiovascular: Positive for chest pain. Negative for palpitations and leg swelling.  Gastrointestinal: Positive for nausea. Negative for vomiting and abdominal pain.  Genitourinary: Negative for dysuria.  Musculoskeletal: Positive for back pain. Negative for neck pain.  Skin: Negative for rash.  Neurological: Negative for syncope and light-headedness.  Psychiatric/Behavioral: The patient is not nervous/anxious.     Allergies  Codeine  Home Medications   Prior to Admission medications   Medication Sig Start Date End Date Taking? Authorizing Provider  amLODipine (NORVASC) 5 MG tablet Take 5 mg by mouth at bedtime.     Historical Provider, MD  aspirin EC 325 MG EC tablet Take 1 tablet (325 mg total) by mouth 2 (two) times daily after a meal. Patient taking differently: Take 81 mg by mouth daily.  01/12/14   Mcarthur Rossetti, MD  atorvastatin (LIPITOR) 40 MG tablet Take 1 tablet (40 mg total) by mouth daily. Patient not taking: Reported on 02/22/2015 06/01/14   Thayer Headings, MD  colchicine 0.6 MG tablet Take 0.6 mg by mouth daily.    Historical Provider, MD  diclofenac sodium (VOLTAREN) 1 % GEL Apply 2 g topically every 6 (six) hours as needed (for pain).    Historical Provider, MD  HYDROcodone-acetaminophen (NORCO) 5-325 MG per tablet Take 1-2 tablets by mouth every 6 (six) hours as needed for moderate pain or severe pain. 01/12/14   Mcarthur Rossetti, MD  INDOMETHACIN ER PO Take 75 mg by mouth as needed (for gout).     Historical Provider, MD  nitroGLYCERIN (NITROSTAT) 0.4 MG SL tablet Place 1 tablet (0.4 mg total) under the tongue every 5 (five) minutes as needed for chest pain. 02/22/15   Thayer Headings, MD  silodosin (RAPAFLO) 8 MG CAPS capsule Take 8 mg by mouth daily with breakfast.    Historical Provider, MD   BP 165/79 mmHg  Pulse 68  Temp(Src) 97.7 F (36.5  C) (Oral)  Resp 18  Ht 5\' 6"  (1.676 m)  Wt 180 lb (81.647 kg)  BMI 29.07 kg/m2  SpO2 98%   Physical Exam  Constitutional: He appears well-developed and well-nourished.  HENT:  Head: Normocephalic and atraumatic.  Mouth/Throat: Mucous membranes are normal. Mucous membranes are not dry.  Eyes: Conjunctivae are normal.  Neck: Trachea normal and normal range of motion. Neck supple. Normal carotid pulses and no JVD present. No muscular tenderness present. Carotid bruit is not present. No tracheal deviation present.  Cardiovascular: Normal rate, regular rhythm, S1 normal, S2 normal, normal heart sounds and intact distal pulses.  Exam reveals no distant heart sounds  and no decreased pulses.   No murmur heard. Pulmonary/Chest: Effort normal and breath sounds normal. No respiratory distress. He has no wheezes. He exhibits no tenderness.  Abdominal: Soft. Normal aorta and bowel sounds are normal. There is no tenderness. There is no rebound and no guarding.  Musculoskeletal: He exhibits no edema.  Neurological: He is alert.  Skin: Skin is warm and dry. He is not diaphoretic. No cyanosis. No pallor.  Psychiatric: He has a normal mood and affect.  Nursing note and vitals reviewed.   ED Course  Procedures (including critical care time) Labs Review Labs Reviewed  COMPREHENSIVE METABOLIC PANEL - Abnormal; Notable for the following:    Glucose, Bld 112 (*)    GFR calc non Af Amer 65 (*)    GFR calc Af Amer 76 (*)    Anion gap 4 (*)    All other components within normal limits  CBC WITH DIFFERENTIAL/PLATELET  CBC  I-STAT TROPOININ, ED    Imaging Review Dg Chest 2 View  03/20/2015   CLINICAL DATA:  Chest pain.  Shortness of breath.  EXAM: CHEST  2 VIEW  COMPARISON:  01/02/2014  FINDINGS: Heart size and pulmonary vascularity are normal and the lungs are clear. No acute osseous abnormality. Surgical changes at the left shoulder.  IMPRESSION: No acute abnormalities.   Electronically Signed   By:  Lorriane Shire M.D.   On: 03/20/2015 10:48     EKG Interpretation   Date/Time:  Wednesday March 20 2015 09:31:10 EDT Ventricular Rate:  76 PR Interval:  138 QRS Duration: 80 QT Interval:  388 QTC Calculation: 436 R Axis:   41 Text Interpretation:  Sinus rhythm with Premature atrial complexes  Otherwise normal ECG Confirmed by ZAVITZ  MD, [name] ([id number]) on 03/20/2015  12:18:56 PM       9:39 AM Patient not yet in room. Medical history reviewed. EKG reviewed.   Vital signs reviewed and are as follows: BP 165/79 mmHg  Pulse 68  Temp(Src) 97.7 F (36.5 C) (Oral)  Resp 18  Ht 5\' 6"  (1.676 m)  Wt 180 lb (81.647 kg)  BMI 29.07 kg/m2  SpO2 98%  10:10 AM Patient seen and examined. Discussed with Dr. Reather Converse who will see.   10:45 AM Cardiology consult requested and is pending. Trop neg.   MDM   Final diagnoses:  Unstable angina   Admit.     Carlisle Cater, PA-C 03/20/15 1447  Elnora Morrison, MD 03/20/15 [phone]

## 2015-03-20 NOTE — H&P (Addendum)
Cardiologist: Margarette Canada PERETZ THIEME is an 74 y.o. male.   Chief Complaint:  Unstable angina HPI:   The patient is a 74 yo male with a history of CAD, HLD, HTN, PVD, chronic back pain, GERD, hyperthyroidism, Fem-pop bypass.  Heart catheterization 03/07/09 which revealed complete occlusion of his proximal right coronary artery with collateral filling from the left system. The first diagonal has a tight stenosis. The circumflex gives off a small OM and is then occluded.  He has moderate diffuse irregularities in the LAD and circumflex.  Inferior basilaraneurysm. He underwent nuclear stress testing on 03/05/15 which was Intermediate risk with large, severe intensity fixed inferior and inferolateral perfusion defect suggestive of scar.  LV Ejection Fraction: 49%. LV Wall Motion: Inferior and inferolateral akinesis.  He presents with chest pain that has been on going for several weeks but becoming more frequent with greater intensity.  It is right-sided, sharp and can occur with or without exertion.  It lasts 5-10 minutes.  It radiates to his right shoulder blade and is associated with nausea, SOB, blurry vision.   He says it feels similar to 2010 when he had the heart cath.  He reports the SOB started about 5-6 months ago and has been getting progressively worse.  This morning he was awaken at 0300hrs with 8-9/10 CP.   He can not walk to the end of the driveway any more without getting very SOB.  He tried one NTG with no relief.   Medication Sig  amLODipine (NORVASC) 5 MG tablet Take 5 mg by mouth at bedtime.   aspirin EC 325 MG EC tablet Take 1 tablet (325 mg total) by mouth 2 (two) times daily after a meal. Patient taking differently: Take 81 mg by mouth daily.   HYDROcodone-acetaminophen (NORCO) 5-325 MG per tablet Take 1-2 tablets by mouth every 6 (six) hours as needed for moderate pain or severe pain.  INDOMETHACIN ER PO Take 75 mg by mouth as needed (for gout).   nitroGLYCERIN (NITROSTAT)  0.4 MG SL tablet Place 1 tablet (0.4 mg total) under the tongue every 5 (five) minutes as needed for chest pain.  silodosin (RAPAFLO) 8 MG CAPS capsule Take 8 mg by mouth at bedtime.   atorvastatin (LIPITOR) 40 MG tablet Take 1 tablet (40 mg total) by mouth daily. Patient not taking: Reported on 02/22/2015  colchicine 0.6 MG tablet Take 0.6 mg by mouth daily.  diclofenac sodium (VOLTAREN) 1 % GEL Apply 2 g topically every 6 (six) hours as needed (for pain).      Past Medical History  Diagnosis Date  . Arthritis   . Myocardial infarction     62    dr Brigitte Pulse PCP          . Hyperlipidemia     takes Crestor daily  . Peripheral vascular disease   . Carotid artery occlusion   . Hypertension     takes Amlodipine daily  . Chronic back pain   . History of gout     has colchicine prn  . History of colon polyps   . Urinary frequency   . Urinary urgency   . Enlarged prostate     takes Rapaflo daily  . History of kidney stones   . Hyperthyroidism     takes SYnthroid daily as instructed  . Cataract     Bil eyes/worse in left eye  . GERD (gastroesophageal reflux disease)     occasional    Past Surgical History  Procedure Laterality Date  . Femoral artery - popliteal artery bypass graft    . Joint replacement      shoulder  . Back surgery      5 times  . Lumbar laminectomy/decompression microdiscectomy  02/12/2012    Procedure: LUMBAR LAMINECTOMY/DECOMPRESSION MICRODISCECTOMY;  Surgeon: Floyce Stakes, MD;  Location: [place] [facility];  Service: Neurosurgery;  Laterality: N/A;  Lumbar four-five laminectomy  . Appendectomy    . Cataract surgery      left eye  . Colonoscopy    . Big toe surgery    . Cystoscopy    . Lumbar laminectomy  01/06/2013    Procedure: MICRODISCECTOMY LUMBAR LAMINECTOMY;  Surgeon: Marybelle Killings, MD;  Location: [place];  Service: Orthopedics;  Laterality: N/A;  L3-4 decompression  . Cardiac catheterization      2010    dr Acie Fredrickson  . Eye surgery    . Total hip  arthroplasty Left 01/09/2014    DR Ninfa Linden  . Total hip arthroplasty Left 01/09/2014    Procedure: LEFT TOTAL HIP ARTHROPLASTY ANTERIOR APPROACH and Steroid Injection Right hip;  Surgeon: Mcarthur Rossetti, MD;  Location: [facility];  Service: Orthopedics;  Laterality: Left;  . Steriod injection Right 01/09/2014    Procedure: STEROID INJECTION;  Surgeon: Mcarthur Rossetti, MD;  Location: [place];  Service: Orthopedics;  Laterality: Right;    Family History  Problem Relation Age of Onset  . Heart disease Father   . Heart attack Father   . Heart disease Sister   . Hypertension Mother   . Diabetes Son    Social History:  reports that he quit smoking about 28 years ago. His smoking use included Cigarettes. He has quit using smokeless tobacco. His smokeless tobacco use included Chew. He reports that he does not drink alcohol or use illicit drugs.  Allergies:  Allergies  Allergen Reactions  . Codeine Nausea And Vomiting          (Not in a hospital admission)  Results for orders placed or performed during the hospital encounter of 03/20/15 (from the past 48 hour(s))  CBC with Differential/Platelet     Status: None   Collection Time: 03/20/15  9:55 AM  Result Value Ref Range   WBC 9.1 4.0 - 10.5 K/uL   RBC 4.86 4.22 - 5.81 MIL/uL   Hemoglobin 15.2 13.0 - 17.0 g/dL   HCT 45.8 39.0 - 52.0 %   MCV 94.2 78.0 - 100.0 fL   MCH 31.3 26.0 - 34.0 pg   MCHC 33.2 30.0 - 36.0 g/dL   RDW 12.9 11.5 - 15.5 %   Platelets 309 150 - 400 K/uL   Neutrophils Relative % 63 43 - 77 %   Neutro Abs 5.7 1.7 - 7.7 K/uL   Lymphocytes Relative 29 12 - 46 %   Lymphs Abs 2.7 0.7 - 4.0 K/uL   Monocytes Relative 7 3 - 12 %   Monocytes Absolute 0.7 0.1 - 1.0 K/uL   Eosinophils Relative 1 0 - 5 %   Eosinophils Absolute 0.1 0.0 - 0.7 K/uL   Basophils Relative 0 0 - 1 %   Basophils Absolute 0.0 0.0 - 0.1 K/uL  Comprehensive metabolic panel     Status: Abnormal   Collection Time: 03/20/15  9:55 AM  Result  Value Ref Range   Sodium 140 135 - 145 mmol/L   Potassium 3.9 3.5 - 5.1 mmol/L   Chloride 107 96 - 112 mmol/L   CO2 29  19 - 32 mmol/L   Glucose, Bld 112 (H) 70 - 99 mg/dL   BUN 10 6 - 23 mg/dL   Creatinine, Ser 1.09 0.50 - 1.35 mg/dL   Calcium 9.0 8.4 - 10.5 mg/dL   Total Protein 7.3 6.0 - 8.3 g/dL   Albumin 3.9 3.5 - 5.2 g/dL   AST 28 0 - 37 U/L   ALT 23 0 - 53 U/L   Alkaline Phosphatase 55 39 - 117 U/L   Total Bilirubin 0.7 0.3 - 1.2 mg/dL   GFR calc non Af Amer 65 (L) >90 mL/min   GFR calc Af Amer 76 (L) >90 mL/min    Comment: (NOTE) The eGFR has been calculated using the CKD EPI equation. This calculation has not been validated in all clinical situations. eGFR's persistently <90 mL/min signify possible Chronic Kidney Disease.    Anion gap 4 (L) 5 - 15  I-stat troponin, ED     Status: None   Collection Time: 03/20/15 10:05 AM  Result Value Ref Range   Troponin i, poc 0.03 0.00 - 0.08 ng/mL   Comment 3            Comment: Due to the release kinetics of cTnI, a negative result within the first hours of the onset of symptoms does not rule out myocardial infarction with certainty. If myocardial infarction is still suspected, repeat the test at appropriate intervals.    Dg Chest 2 View  03/20/2015   CLINICAL DATA:  Chest pain.  Shortness of breath.  EXAM: CHEST  2 VIEW  COMPARISON:  01/02/2014  FINDINGS: Heart size and pulmonary vascularity are normal and the lungs are clear. No acute osseous abnormality. Surgical changes at the left shoulder.  IMPRESSION: No acute abnormalities.   Electronically Signed   By: Lorriane Shire M.D.   On: 03/20/2015 10:48    Review of Systems  Constitutional: Negative for fever and diaphoresis.  HENT: Negative for congestion and sore throat.   Eyes: Positive for blurred vision.  Respiratory: Positive for shortness of breath. Negative for cough.   Cardiovascular: Positive for chest pain. Negative for palpitations, orthopnea, leg swelling and  PND.  Gastrointestinal: Positive for nausea. Negative for vomiting, abdominal pain, blood in stool and melena.  Genitourinary: Negative for dysuria.  Musculoskeletal: Positive for back pain (right shoulder blade).  Neurological: Positive for dizziness.  All other systems reviewed and are negative.   Blood pressure 149/72, pulse 60, temperature 97.7 F (36.5 C), temperature source Oral, resp. rate 18, height 5' 6" (1.676 m), weight 180 lb (81.647 kg), SpO2 100 %. Physical Exam  Nursing note and vitals reviewed. Constitutional: He is oriented to person, place, and time. He appears well-developed and well-nourished. No distress.  HENT:  Head: Normocephalic and atraumatic.  Eyes: EOM are normal. Pupils are equal, round, and reactive to light. No scleral icterus.  Neck: Normal range of motion. Neck supple.  Cardiovascular: Normal rate, regular rhythm, S1 normal and S2 normal.   No murmur heard. Pulses:      Radial pulses are 2+ on the right side, and 2+ on the left side.       Dorsalis pedis pulses are 2+ on the right side, and 2+ on the left side.  No  Carotid bruits  Respiratory: Effort normal and breath sounds normal. He has no wheezes. He has no rales.  GI: Soft. Bowel sounds are normal. He exhibits no distension. There is no tenderness.  Musculoskeletal: He exhibits no edema.  Neurological:  He is alert and oriented to person, place, and time. He exhibits normal muscle tone.  Skin: Skin is warm and dry.  Psychiatric: He has a normal mood and affect.    Cath report 02/2009 ANGIOGRAPHY: Left main. The left main has minor irregularities. There is moderate degree of calcification extending from the left main down to the LAD.  The left anterior descending artery has a proximal 30% stenosis. There is diffuse irregularities between 30-40% along the entire LAD. The mid LAD has a 40-50% stenosis and the distal LAD has a 50-60% stenosis. The first diagonal artery has  mild-to-moderate diffuse disease.  The ramus intermediate vessel has mild diffuse disease. There is 30-40% stenosis throughout.  The left circumflex artery has a proximal 30-40% stenosis. It gives off a small-to-medium size first obtuse marginal branch and is then occluded. The terminal circumflex artery fills very slowly and has probably TIMI grade 1 flow. It fills via left-to-left collaterals.  The right coronary artery is dominant. It is occluded very proximally. The distal right coronary artery fills primarily via left-to-right collaterals from the LAD. There is some right-to-right filling via collaterals and the right coronary artery seemed to be severely and diffusely diseased throughout its course.  The left ventriculogram was performed in the 30 RAO position. Reveals, overall well-preserved left ventricular systolic function. There is inferior basilar aneurysm. The overall ejection fraction is probably 50%. Excluding the aneurysm, the left ventricular systolic function is quite normal. There is 1-2 plus mitral regurgitation.   Assessment/Plan Principal Problem:   Unstable angina Active Problems:   Coronary artery disease   HTN (hypertension)  74 yo male with a history of CAD, HLD, HTN, PVD, chronic back pain, GERD, hyperthyroidism, Fem-pop bypass.  Heart catheterization 03/07/09 which revealed complete occlusion of his proximal right coronary artery with collateral filling from the left system. The first diagonal has a tight stenosis. The circumflex gives off a small OM and is then occluded.  He has moderate diffuse irregularities in the LAD and circumflex.  He underwent nuclear stress testing on 03/05/15 which was intermediate risk with large, severe intensity fixed inferior and inferolateral perfusion defect suggestive of scar.  LV Ejection Fraction: 49%. LV Wall Motion: Inferior and inferolateral akinesis.   He presents with chest pain that  has been on going for several weeks and progressive dyspnea for 5-6 months.  His symptoms are certainly concerning of unstable angina although certain aspects are a little atypical.  Initial troponin is negative and there are no acute EKG changes which is reassuring given the severity of the pain at 0300hrs.  He will be admitted.  We will cycle troponin.  Add IV heparin and low dose BB.  Continue statin.  Check lipis  ASA already given.  His last cath in 02/2009 showed diffuse 30-60% diffuse LAD, ramus and circ disease.   Schedule left heart cath for tomorrow.     HAGER, BRYAN, PA-C 03/20/2015, 1:46 PM  As above, patient seen and examined. Briefly he is a 74 year old male with past medical history of coronary artery disease, hypertension, hyperlipidemia, peripheral vascular disease presenting with chest pain and dyspnea. He complains of dyspnea on exertion for approximately 6 months which is slowly worsening. Over the past 2 months he has developed chest pain. It occurs in the right side of his chest and radiates to his back. It is not pleuritic, positional or exertional. Last 5-10 minutes and resolves. He woke up with an episode this morning and presented to the emergency  room. Presently pain free. Electrocardiogram shows sinus rhythm, PACs and possible prior posterior infarct. Recent nuclear study showed inferior lateral infarct but no ischemia. Ejection fraction 49%. Patient has persistent dyspnea and chest pain. He will need definitive evaluation. Plan cardiac catheterization. The risks and benefits were discussed and he agrees to proceed. Check echocardiogram. [place] Schedule d-dimer and if elevated he will need further evaluation for pulmonary embolus. [name] Ruths

## 2015-03-20 NOTE — ED Notes (Signed)
Pt c/o right sided CP into right arm x months worse last night through to back with SOB

## 2015-03-20 NOTE — ED Notes (Signed)
Pt reports intermittent periods of right sided chest pain that radiates into bilateral shoulder blades.  Pt reports increased SOB with episodes as well along with weakness.  Pt reports he was woken up at 0300 this morning with the sharp pain in his chest and SOB that lasted for a few seconds.  Pt reports minimal pain at this time.

## 2015-03-20 NOTE — Telephone Encounter (Signed)
.  Pt c/o of Chest Pain: STAT if CP now or developed within 24 hours  1. Are you having CP right now?  OFF AND ON   2. Are you experiencing any other symptoms (ex. SOB, nausea, vomiting, sweating)? SOB   3. How long have you been experiencing CP?  2-3 WEEKS  4. Is your CP continuous or coming and going?  COMING AND GOING  5. Have you taken Nitroglycerin?  NO ? PT NUMBER [phone]

## 2015-03-20 NOTE — ED Notes (Signed)
Cardiology at bedside to consult.

## 2015-03-21 ENCOUNTER — Encounter (HOSPITAL_COMMUNITY)
Admission: EM | Disposition: A | Discharge status: Home / Self Care | Payer: Self-pay | Source: Home / Self Care | Attending: Cardiovascular Disease

## 2015-03-21 ENCOUNTER — Observation Stay (HOSPITAL_COMMUNITY): Payer: Medicare Other

## 2015-03-21 ENCOUNTER — Encounter (HOSPITAL_COMMUNITY): Payer: Self-pay | Admitting: Physician Assistant

## 2015-03-21 DIAGNOSIS — Z96642 Presence of left artificial hip joint: Secondary | ICD-10-CM | POA: Diagnosis present

## 2015-03-21 DIAGNOSIS — E785 Hyperlipidemia, unspecified: Secondary | ICD-10-CM | POA: Diagnosis present

## 2015-03-21 DIAGNOSIS — I10 Essential (primary) hypertension: Secondary | ICD-10-CM | POA: Diagnosis not present

## 2015-03-21 DIAGNOSIS — R072 Precordial pain: Secondary | ICD-10-CM | POA: Diagnosis not present

## 2015-03-21 DIAGNOSIS — M109 Gout, unspecified: Secondary | ICD-10-CM | POA: Diagnosis present

## 2015-03-21 DIAGNOSIS — H269 Unspecified cataract: Secondary | ICD-10-CM | POA: Diagnosis present

## 2015-03-21 DIAGNOSIS — Z79899 Other long term (current) drug therapy: Secondary | ICD-10-CM | POA: Diagnosis not present

## 2015-03-21 DIAGNOSIS — M549 Dorsalgia, unspecified: Secondary | ICD-10-CM | POA: Diagnosis present

## 2015-03-21 DIAGNOSIS — G8929 Other chronic pain: Secondary | ICD-10-CM | POA: Diagnosis present

## 2015-03-21 DIAGNOSIS — I2699 Other pulmonary embolism without acute cor pulmonale: Secondary | ICD-10-CM | POA: Diagnosis not present

## 2015-03-21 DIAGNOSIS — I200 Unstable angina: Secondary | ICD-10-CM | POA: Diagnosis not present

## 2015-03-21 DIAGNOSIS — Z87891 Personal history of nicotine dependence: Secondary | ICD-10-CM | POA: Diagnosis not present

## 2015-03-21 DIAGNOSIS — I739 Peripheral vascular disease, unspecified: Secondary | ICD-10-CM | POA: Diagnosis present

## 2015-03-21 DIAGNOSIS — R079 Chest pain, unspecified: Secondary | ICD-10-CM | POA: Diagnosis not present

## 2015-03-21 DIAGNOSIS — N400 Enlarged prostate without lower urinary tract symptoms: Secondary | ICD-10-CM | POA: Diagnosis present

## 2015-03-21 DIAGNOSIS — K219 Gastro-esophageal reflux disease without esophagitis: Secondary | ICD-10-CM | POA: Diagnosis present

## 2015-03-21 DIAGNOSIS — E0590 Thyrotoxicosis, unspecified without thyrotoxic crisis or storm: Secondary | ICD-10-CM | POA: Diagnosis present

## 2015-03-21 DIAGNOSIS — M1990 Unspecified osteoarthritis, unspecified site: Secondary | ICD-10-CM | POA: Diagnosis present

## 2015-03-21 DIAGNOSIS — I252 Old myocardial infarction: Secondary | ICD-10-CM | POA: Diagnosis not present

## 2015-03-21 DIAGNOSIS — I25110 Atherosclerotic heart disease of native coronary artery with unstable angina pectoris: Secondary | ICD-10-CM | POA: Diagnosis not present

## 2015-03-21 DIAGNOSIS — R791 Abnormal coagulation profile: Secondary | ICD-10-CM | POA: Diagnosis not present

## 2015-03-21 DIAGNOSIS — I2582 Chronic total occlusion of coronary artery: Secondary | ICD-10-CM | POA: Diagnosis not present

## 2015-03-21 LAB — BASIC METABOLIC PANEL
Anion gap: 9 (ref 5–15)
BUN: 11 mg/dL (ref 6–23)
CHLORIDE: 108 mmol/L (ref 96–112)
CO2: 23 mmol/L (ref 19–32)
Calcium: 8.8 mg/dL (ref 8.4–10.5)
Creatinine, Ser: 1.14 mg/dL (ref 0.50–1.35)
GFR calc non Af Amer: 62 mL/min — ABNORMAL LOW (ref >90)
GFR, EST AFRICAN AMERICAN: 72 mL/min — AB (ref >90)
Glucose, Bld: 123 mg/dL — ABNORMAL HIGH (ref 70–99)
POTASSIUM: 4.6 mmol/L (ref 3.5–5.1)
Sodium: 140 mmol/L (ref 135–145)

## 2015-03-21 LAB — HEPARIN LEVEL (UNFRACTIONATED)
HEPARIN UNFRACTIONATED: 0.28 [IU]/mL — AB (ref 0.30–0.70)
Heparin Unfractionated: 0.29 IU/mL — ABNORMAL LOW (ref 0.30–0.70)

## 2015-03-21 LAB — LIPID PANEL
CHOLESTEROL: 187 mg/dL (ref 0–200)
HDL: 32 mg/dL — AB (ref >39)
LDL Cholesterol: 128 mg/dL — ABNORMAL HIGH (ref 0–99)
TRIGLYCERIDES: 133 mg/dL (ref <150)
Total CHOL/HDL Ratio: 5.8 RATIO
VLDL: 27 mg/dL (ref 0–40)

## 2015-03-21 LAB — PROTIME-INR
INR: 1.04 (ref 0.00–1.49)
Prothrombin Time: 13.7 seconds (ref 11.6–15.2)

## 2015-03-21 LAB — TROPONIN I
Troponin I: 0.04 ng/mL — ABNORMAL HIGH (ref <0.031)
Troponin I: 0.03 ng/mL (ref <0.031)

## 2015-03-21 LAB — HEMOGLOBIN A1C
Hgb A1c MFr Bld: 6.4 % — ABNORMAL HIGH (ref 4.8–5.6)
MEAN PLASMA GLUCOSE: 137 mg/dL

## 2015-03-21 SURGERY — LEFT AND RIGHT HEART CATHETERIZATION WITH CORONARY ANGIOGRAM

## 2015-03-21 SURGERY — LEFT HEART CATHETERIZATION WITH CORONARY ANGIOGRAM
Anesthesia: LOCAL

## 2015-03-21 MED ORDER — TECHNETIUM TO 99M ALBUMIN AGGREGATED
6.0000 | Freq: Once | INTRAVENOUS | Status: AC | PRN
Start: 2015-03-21 — End: 2015-03-21
  Administered 2015-03-21: 6 via INTRAVENOUS

## 2015-03-21 MED ORDER — ROSUVASTATIN CALCIUM 20 MG PO TABS: 20.0000 mg | ORAL_TABLET | Freq: Every day | ORAL | Status: DC

## 2015-03-21 MED ORDER — METOPROLOL TARTRATE 25 MG PO TABS: 12.5000 mg | ORAL_TABLET | Freq: Two times a day (BID) | ORAL | Status: DC

## 2015-03-21 MED ORDER — TECHNETIUM TC 99M DIETHYLENETRIAME-PENTAACETIC ACID: 40.0000 | Freq: Once | INTRAVENOUS | Status: DC | PRN

## 2015-03-21 MED ORDER — RIVAROXABAN 20 MG PO TABS: 20.0000 mg | ORAL_TABLET | Freq: Every day | ORAL | Status: DC

## 2015-03-21 MED ORDER — RIVAROXABAN (XARELTO) VTE STARTER PACK (15 & 20 MG): ORAL_TABLET | ORAL | Status: DC

## 2015-03-21 NOTE — Progress Notes (Signed)
[facility] for heparin Indication: chest pain/ACS  Allergies  Allergen Reactions  . Codeine Nausea And Vomiting        Labs:  Recent Labs  03/20/15 0955 03/20/15 2230 03/21/15 0244 03/21/15 0548 03/21/15 0910  HGB 15.2 14.4  --   --   --   HCT 45.8 42.6  --   --   --   PLT 309 273  --   --   --   LABPROT  --   --   --   --  13.7  INR  --   --   --   --  1.04  HEPARINUNFRC  --   --  0.28*  --  0.29*  CREATININE 1.09  --   --  1.14  --     Estimated Creatinine Clearance: 59.1 mL/min (by C-G formula based on Cr of 1.14).   Assessment: 74 yo M with R sided CP into R arms x months, worse last night with SOB.  Pharmacy consulted to dose heparin for ACS.  No anticoagulants PTA.  Wt 81.6 kg, CBC WNL, creat 1.09  Heparin level = 0.29 this AM  TSH elevated --> FYI   Goal of Therapy:  Heparin level 0.3-0.7 units/ml Monitor platelets by anticoagulation protocol: Yes   Plan:  Increase heparin to 1250 units / hr Follow up AM labs  Thank you. Anette Guarneri, PharmD [phone] 03/21/2015 10:48 AM

## 2015-03-21 NOTE — Progress Notes (Signed)
   Long discussion with the patient had his wife.  He has had chest pain that is not like previous angina and associated with SOB.  Dr. Stanford Breed checked a D Dimer and it was elevated.  He had an intermediate prob VQ. The clinical picture could be PE.  There is a family history with his brother having PEs.  He does take long drives.  He has no contraindications to NOAC.  He has no bleeding risk or active bleeding.  I think the most prudent thing is to start Xarelto probably for 3 months.  Xarelto 20 mg.  He can stop his ASA.  Also he wants to take Crestor at discharge (20 mg daily).  Follow up in about 7 to 10 days in our office ([name] or APP.).   (Note EKG today OK.  One trop was minimally elevated and nonspecific.)

## 2015-03-21 NOTE — H&P (Signed)
SUBJECTIVE:  Still with chest pain overnight.  No troponins back and no Repeat EKG.     PHYSICAL EXAM Filed Vitals:   03/20/15 2034 03/20/15 2100 03/20/15 2150 03/21/15 0500  BP: 150/70 117/51 137/65 139/69  Pulse: 68 77 67 74  Temp: 98 F (36.7 C)  98.6 F (37 C) 98.1 F (36.7 C)  TempSrc: Oral   Oral  Resp:    16  Height:      Weight:    188 lb (85.276 kg)  SpO2: 98% 99%  97%   General:  No distress Lungs:  Clear Heart:  RRR Abdomen:  Positive bowel sounds, no rebound no guarding Extremities:  No edema Neuro:  Nonfocal  LABS: Lab Results  Component Value Date   TROPONINI 0.01        NO INDICATION OF MYOCARDIAL INJURY. 03/06/2009   Results for orders placed or performed during the hospital encounter of 03/20/15 (from the past 24 hour(s))  CBC with Differential/Platelet     Status: None   Collection Time: 03/20/15  9:55 AM  Result Value Ref Range   WBC 9.1 4.0 - 10.5 K/uL   RBC 4.86 4.22 - 5.81 MIL/uL   Hemoglobin 15.2 13.0 - 17.0 g/dL   HCT 45.8 39.0 - 52.0 %   MCV 94.2 78.0 - 100.0 fL   MCH 31.3 26.0 - 34.0 pg   MCHC 33.2 30.0 - 36.0 g/dL   RDW 12.9 11.5 - 15.5 %   Platelets 309 150 - 400 K/uL   Neutrophils Relative % 63 43 - 77 %   Neutro Abs 5.7 1.7 - 7.7 K/uL   Lymphocytes Relative 29 12 - 46 %   Lymphs Abs 2.7 0.7 - 4.0 K/uL   Monocytes Relative 7 3 - 12 %   Monocytes Absolute 0.7 0.1 - 1.0 K/uL   Eosinophils Relative 1 0 - 5 %   Eosinophils Absolute 0.1 0.0 - 0.7 K/uL   Basophils Relative 0 0 - 1 %   Basophils Absolute 0.0 0.0 - 0.1 K/uL  Comprehensive metabolic panel     Status: Abnormal   Collection Time: 03/20/15  9:55 AM  Result Value Ref Range   Sodium 140 135 - 145 mmol/L   Potassium 3.9 3.5 - 5.1 mmol/L   Chloride 107 96 - 112 mmol/L   CO2 29 19 - 32 mmol/L   Glucose, Bld 112 (H) 70 - 99 mg/dL   BUN 10 6 - 23 mg/dL   Creatinine, Ser 1.09 0.50 - 1.35 mg/dL   Calcium 9.0 8.4 - 10.5 mg/dL   Total Protein 7.3 6.0 - 8.3 g/dL   Albumin  3.9 3.5 - 5.2 g/dL   AST 28 0 - 37 U/L   ALT 23 0 - 53 U/L   Alkaline Phosphatase 55 39 - 117 U/L   Total Bilirubin 0.7 0.3 - 1.2 mg/dL   GFR calc non Af Amer 65 (L) >90 mL/min   GFR calc Af Amer 76 (L) >90 mL/min   Anion gap 4 (L) 5 - 15  I-stat troponin, ED     Status: None   Collection Time: 03/20/15 10:05 AM  Result Value Ref Range   Troponin i, poc 0.03 0.00 - 0.08 ng/mL   Comment 3          TSH     Status: Abnormal   Collection Time: 03/20/15  7:43 PM  Result Value Ref Range   TSH 6.399 (H) 0.350 - 4.500 uIU/mL  D-dimer, quantitative     Status: Abnormal   Collection Time: 03/20/15  7:43 PM  Result Value Ref Range   D-Dimer, Quant 2.14 (H) 0.00 - 0.48 ug/mL-FEU  CBC     Status: None   Collection Time: 03/20/15 10:30 PM  Result Value Ref Range   WBC 8.4 4.0 - 10.5 K/uL   RBC 4.57 4.22 - 5.81 MIL/uL   Hemoglobin 14.4 13.0 - 17.0 g/dL   HCT 42.6 39.0 - 52.0 %   MCV 93.2 78.0 - 100.0 fL   MCH 31.5 26.0 - 34.0 pg   MCHC 33.8 30.0 - 36.0 g/dL   RDW 12.8 11.5 - 15.5 %   Platelets 273 150 - 400 K/uL  Heparin level (unfractionated)     Status: Abnormal   Collection Time: 03/21/15  2:44 AM  Result Value Ref Range   Heparin Unfractionated 0.28 (L) 0.30 - 0.70 IU/mL  Lipid panel     Status: Abnormal   Collection Time: 03/21/15  5:48 AM  Result Value Ref Range   Cholesterol 187 0 - 200 mg/dL   Triglycerides 133 <150 mg/dL   HDL 32 (L) >39 mg/dL   Total CHOL/HDL Ratio 5.8 RATIO   VLDL 27 0 - 40 mg/dL   LDL Cholesterol 128 (H) 0 - 99 mg/dL  Basic metabolic panel     Status: Abnormal   Collection Time: 03/21/15  5:48 AM  Result Value Ref Range   Sodium 140 135 - 145 mmol/L   Potassium 4.6 3.5 - 5.1 mmol/L   Chloride 108 96 - 112 mmol/L   CO2 23 19 - 32 mmol/L   Glucose, Bld 123 (H) 70 - 99 mg/dL   BUN 11 6 - 23 mg/dL   Creatinine, Ser 1.14 0.50 - 1.35 mg/dL   Calcium 8.8 8.4 - 10.5 mg/dL   GFR calc non Af Amer 62 (L) >90 mL/min   GFR calc Af Amer 72 (L) >90 mL/min    Anion gap 9 5 - 15    Intake/Output Summary (Last 24 hours) at 03/21/15 0744 Last data filed at 03/21/15 0700  Gross per 24 hour  Intake 983.47 ml  Output   1250 ml  Net -266.53 ml     ASSESSMENT AND PLAN:  CHEST PAIN:  Atypical.  Plan VQ scan.  If negative we could do his cath tomorrow.  Discussed at length with the patient and his family.  CAD:  As above.  ELEVATED D DIMER:  Rule out PE with VQ  DYSLIPIDEMIA:  Discussed with the patient. He didn't take Lipitor because "It made me feel bad."  However, he agrees to try it again.  LDL 128 with known CAD.     Jeneen Rinks [facility] 03/21/2015 7:44 AM

## 2015-03-21 NOTE — Discharge Instructions (Signed)

## 2015-03-21 NOTE — Progress Notes (Signed)
ANTICOAGULATION CONSULT NOTE - Follow Up Consult  Pharmacy Consult for heparin Indication: r/o ACS vs PE   Labs:  Recent Labs  03/20/15 0955 03/20/15 2230 03/21/15 0244  HGB 15.2 14.4  --   HCT 45.8 42.6  --   PLT 309 273  --   HEPARINUNFRC  --   --  0.28*  CREATININE 1.09  --   --      Assessment: 73yo male subtherapeutic on heparin with initial dosing for possible ACS, now also w/ elevated D-dimer, for r/o PE.  Goal of Therapy:  Heparin level 0.3-0.7 units/ml   Plan:  Will increase heparin gtt slightly to 1100 units/hr and check level in 6hr.  Wynona Neat, PharmD, BCPS  03/21/2015,3:13 AM

## 2015-03-21 NOTE — Progress Notes (Signed)
Pt discharged via W/C, condition stable, accompanied by spouse.

## 2015-03-21 NOTE — Progress Notes (Signed)
UR completed 

## 2015-03-21 NOTE — Discharge Summary (Signed)
Discharge Summary   Patient ID: Ryan [name],  MRN: [id number], DOB/AGE: August 18, 1941 74 y.o.  Admit date: 03/20/2015 Discharge date: 03/21/2015  Primary Care Provider: Marton Redwood Primary Cardiologist: Dr. Acie Fredrickson  Discharge Diagnoses Principal Problem:   Atypical chest pain Active Problems:   Coronary artery disease   HTN (hypertension)   PVD (peripheral vascular disease)   Pulmonary emboli   Allergies Allergies  Allergen Reactions  . Codeine Nausea And Vomiting         Procedures  V/Q scan 03/21/2015  FINDINGS: Ventilation: No focal ventilation defect.  Perfusion: Bandlike defect noted anteriorly in the right lung base, likely right middle lobe. No other focal defects.  IMPRESSION: Single segmental perfusion defect without corresponding ventilation or chest x-ray abnormality noted in the right middle lobe. This is intermediate probability for pulmonary embolus.    Hospital Course  The patient is a 74 yo male with a history of CAD, HLD, HTN, PVD, chronic back pain, GERD, hyperthyroidism, Fem-pop bypass. Cardiac catheterization 03/07/09 which revealed complete occlusion of his proximal right coronary artery with collateral filling from the left system. The first diagonal has a tight stenosis. The circumflex gives off a small OM and is then occluded. He has moderate diffuse irregularities in the LAD and circumflex. Inferior basilaraneurysm. He underwent nuclear stress testing on 03/05/15 which was Intermediate risk with large, severe intensity fixed inferior and inferolateral perfusion defect suggestive of scar. LV Ejection Fraction: 49%. LV Wall Motion: Inferior and inferolateral akinesis.  He presented to [facility] on 03/20/2015 with chest pain going on for several weeks but becoming more frequently with greater intensity. He described it as a right-sided sharp chest discomfort lasting 5-10 minutes each. He also endorsed some shortness of breath  started 5-6 month ago. It was felt he his initial symptom was concerning for unstable angina, although certain aspect were atypical. Initial troponin was negative and the EKG did not reveal any sign of ischemia. He was admitted for potential cardiac catheterization. It was felt he will need a d-dimer to rule out PE given the atypical nature of his chest discomfort. Overnight serial troponin were negative except one of  Three enzyme was borderline at 0.04. Lipid panel shows LDL 128, HDL 32. Patient has not been taking Lipitor as it made him feel bad. However he has agreed to take Crestor 20mg  daily. D-dimer was elevated at 2.14. A VQ scan was obtained which showed single segmental perfusion defect without corresponding ventilation or chest x-ray no abnormality noted in the right middle lobe, intermediate probability for PE.   Dr. Percival Spanish had a long discussion with the patient and his wife, his current chest pain was unlike previous angina and has been associated with shortness of breath. He had intermediate probability based on VQ scan. The clinical picture indicate likelihood of PE. He has a family history with his brother having PEs. He did recently take long drives. He has no contraindication to NOAC. He has no bleeding risk or active bleeding. He will be started on Xarelto 20 mg twice a day for the next 3 months (initiate with 15mg  BID for 21 days and 20mg  daily after that). His aspirin will be discontinued. It is felt that cardiac catheterization is no longer indicated given the likelihood of PE. I will arrange follow-up in 7-10 days with Dr. Acie Fredrickson or APP in the clinic. Patient has been instructed to contact cardiology if has any bleeding episode after starting Xarelto.   Discharge Vitals Blood pressure 132/60, pulse  72, temperature 97.7 F (36.5 C), temperature source Oral, resp. rate 16, height 5\' 6"  (1.676 m), weight 188 lb (85.276 kg), SpO2 97 %.  Filed Weights   03/20/15 0935 03/20/15 1735  03/21/15 0500  Weight: 180 lb (81.647 kg) 177 lb 9.6 oz (80.559 kg) 188 lb (85.276 kg)    Labs  CBC  Recent Labs  03/20/15 0955 03/20/15 2230  WBC 9.1 8.4  NEUTROABS 5.7  --   HGB 15.2 14.4  HCT 45.8 42.6  MCV 94.2 93.2  PLT 309 644   Basic Metabolic Panel  Recent Labs  03/20/15 0955 03/21/15 0548  NA 140 140  K 3.9 4.6  CL 107 108  CO2 29 23  GLUCOSE 112* 123*  BUN 10 11  CREATININE 1.09 1.14  CALCIUM 9.0 8.8   Liver Function Tests  Recent Labs  03/20/15 0955  AST 28  ALT 23  ALKPHOS 55  BILITOT 0.7  PROT 7.3  ALBUMIN 3.9   Cardiac Enzymes  Recent Labs  03/21/15 0910 03/21/15 1400  TROPONINI 0.04* 0.03   BNP Invalid input(s): POCBNP D-Dimer  Recent Labs  03/20/15 1943  DDIMER 2.14*   Hemoglobin A1C  Recent Labs  03/20/15 1631  HGBA1C 6.4*   Fasting Lipid Panel  Recent Labs  03/21/15 0548  CHOL 187  HDL 32*  LDLCALC 128*  TRIG 133  CHOLHDL 5.8   Thyroid Function Tests  Recent Labs  03/20/15 1943  TSH 6.399*    Disposition  Pt is being discharged home today in good condition.  Follow-up Plans & Appointments      Follow-up Information    Follow up with [name], [name], MD.   Specialty:  Cardiology   Why:  Office will contact you within 2 business days to arrange followup with Dr. Acie Fredrickson or his PA/NP for 7-10 days followup, please give [place] a call if you do not hear from [place]   Contact information:   [address] [place] [address] [phone]       Discharge Medications    Medication List    STOP taking these medications        aspirin 325 MG EC tablet     atorvastatin 40 MG tablet  Commonly known as:  LIPITOR     INDOMETHACIN ER PO      TAKE these medications        amLODipine 5 MG tablet  Commonly known as:  NORVASC  Take 5 mg by mouth at bedtime.     colchicine 0.6 MG tablet  Take 0.6 mg by mouth daily.     diclofenac sodium 1 % Gel  Commonly known as:  VOLTAREN    Apply 2 g topically every 6 (six) hours as needed (for pain).     HYDROcodone-acetaminophen 5-325 MG per tablet  Commonly known as:  NORCO  Take 1-2 tablets by mouth every 6 (six) hours as needed for moderate pain or severe pain.     metoprolol tartrate 25 MG tablet  Commonly known as:  LOPRESSOR  Take 0.5 tablets (12.5 mg total) by mouth 2 (two) times daily.     nitroGLYCERIN 0.4 MG SL tablet  Commonly known as:  NITROSTAT  Place 1 tablet (0.4 mg total) under the tongue every 5 (five) minutes as needed for chest pain.     Rivaroxaban 15 & 20 MG Tbpk  Commonly known as:  XARELTO STARTER PACK  Take as directed on package: Start with one 15mg   tablet by mouth twice a day with food. On Day 22, switch to one 20mg  tablet once a day with food.     rivaroxaban 20 MG Tabs tablet  Commonly known as:  XARELTO  Take 1 tablet (20 mg total) by mouth daily with supper.     rosuvastatin 20 MG tablet  Commonly known as:  CRESTOR  Take 1 tablet (20 mg total) by mouth daily.     silodosin 8 MG Caps capsule  Commonly known as:  RAPAFLO  Take 8 mg by mouth at bedtime.        Duration of Discharge Encounter   Greater than 30 minutes including physician time.  Hilbert Corrigan PA-C Pager: [phone] 03/21/2015, 6:41 PM

## 2015-03-22 ENCOUNTER — Telehealth: Payer: Self-pay | Admitting: Cardiovascular Disease

## 2015-03-22 ENCOUNTER — Telehealth: Payer: Self-pay | Admitting: Cardiology

## 2015-03-22 NOTE — Telephone Encounter (Signed)
Wife states [facility] [address] do not have the Xarelto or the Xarelto starter packages until Tuesday of next week. She will call [facility] on [address] to see if they have this If not, she will return call to office as we do have samples of Xarelto 15mg  to get pt started. Horton Chin RN

## 2015-03-22 NOTE — Telephone Encounter (Signed)
lmtcb Debbie Emilynn Srinivasan RN  

## 2015-03-22 NOTE — Telephone Encounter (Signed)
New Message  Pt wife calling about medication. Please call back and discuss.

## 2015-03-22 NOTE — Telephone Encounter (Signed)
Follow up  [facility] had the Xarelto. Please call back to discuss

## 2015-03-22 NOTE — Telephone Encounter (Signed)
New message      TCM appt on 04-01-15 per [facility].  Pt could not come sooner than the 11th.

## 2015-03-25 NOTE — Telephone Encounter (Signed)
Spoke with pt's wife. Pt was able to get Xarelto at [facility] and is taking

## 2015-03-25 NOTE — Telephone Encounter (Signed)
Patient contacted regarding discharge from [facility] on March 31,2016.  Spoke with pt's wife  Patient understands to follow up with provider Yes.  Ellen Henri, [place] on April 01, 2015 at 11:30.  Patient understands discharge instructions?  yes Patient understands medications and regiment?  yes Patient understands to bring all medications to this visit?  yes   Pt was able to get Xarelto at [facility] and is taking

## 2015-04-01 ENCOUNTER — Telehealth: Payer: Self-pay | Admitting: Cardiovascular Disease

## 2015-04-01 ENCOUNTER — Emergency Department (HOSPITAL_COMMUNITY): Payer: Medicare Other

## 2015-04-01 ENCOUNTER — Encounter: Payer: Medicare Other | Admitting: Cardiology

## 2015-04-01 ENCOUNTER — Observation Stay (HOSPITAL_COMMUNITY): Payer: Medicare Other

## 2015-04-01 ENCOUNTER — Encounter (HOSPITAL_COMMUNITY): Payer: Self-pay | Admitting: Emergency Medicine

## 2015-04-01 ENCOUNTER — Inpatient Hospital Stay (HOSPITAL_COMMUNITY)
Admission: EM | Admit: 2015-04-01 | Discharge: 2015-04-10 | DRG: 234 | Disposition: A | Discharge status: Home / Self Care | Payer: Medicare Other | Attending: Thoracic Surgery (Cardiothoracic Vascular Surgery) | Admitting: Thoracic Surgery (Cardiothoracic Vascular Surgery)

## 2015-04-01 DIAGNOSIS — Z95828 Presence of other vascular implants and grafts: Secondary | ICD-10-CM

## 2015-04-01 DIAGNOSIS — E8770 Fluid overload, unspecified: Secondary | ICD-10-CM | POA: Diagnosis not present

## 2015-04-01 DIAGNOSIS — N400 Enlarged prostate without lower urinary tract symptoms: Secondary | ICD-10-CM | POA: Diagnosis present

## 2015-04-01 DIAGNOSIS — K219 Gastro-esophageal reflux disease without esophagitis: Secondary | ICD-10-CM | POA: Diagnosis present

## 2015-04-01 DIAGNOSIS — I25110 Atherosclerotic heart disease of native coronary artery with unstable angina pectoris: Secondary | ICD-10-CM | POA: Diagnosis not present

## 2015-04-01 DIAGNOSIS — E785 Hyperlipidemia, unspecified: Secondary | ICD-10-CM | POA: Diagnosis present

## 2015-04-01 DIAGNOSIS — Z885 Allergy status to narcotic agent status: Secondary | ICD-10-CM

## 2015-04-01 DIAGNOSIS — D62 Acute posthemorrhagic anemia: Secondary | ICD-10-CM | POA: Diagnosis not present

## 2015-04-01 DIAGNOSIS — I252 Old myocardial infarction: Secondary | ICD-10-CM

## 2015-04-01 DIAGNOSIS — I2582 Chronic total occlusion of coronary artery: Secondary | ICD-10-CM | POA: Diagnosis not present

## 2015-04-01 DIAGNOSIS — E0590 Thyrotoxicosis, unspecified without thyrotoxic crisis or storm: Secondary | ICD-10-CM | POA: Diagnosis present

## 2015-04-01 DIAGNOSIS — M1990 Unspecified osteoarthritis, unspecified site: Secondary | ICD-10-CM | POA: Diagnosis present

## 2015-04-01 DIAGNOSIS — Z7902 Long term (current) use of antithrombotics/antiplatelets: Secondary | ICD-10-CM

## 2015-04-01 DIAGNOSIS — I70219 Atherosclerosis of native arteries of extremities with intermittent claudication, unspecified extremity: Secondary | ICD-10-CM

## 2015-04-01 DIAGNOSIS — I70209 Unspecified atherosclerosis of native arteries of extremities, unspecified extremity: Secondary | ICD-10-CM

## 2015-04-01 DIAGNOSIS — I4581 Long QT syndrome: Secondary | ICD-10-CM | POA: Diagnosis not present

## 2015-04-01 DIAGNOSIS — I25118 Atherosclerotic heart disease of native coronary artery with other forms of angina pectoris: Secondary | ICD-10-CM | POA: Diagnosis not present

## 2015-04-01 DIAGNOSIS — R079 Chest pain, unspecified: Secondary | ICD-10-CM | POA: Insufficient documentation

## 2015-04-01 DIAGNOSIS — M109 Gout, unspecified: Secondary | ICD-10-CM | POA: Diagnosis present

## 2015-04-01 DIAGNOSIS — R0789 Other chest pain: Secondary | ICD-10-CM | POA: Diagnosis not present

## 2015-04-01 DIAGNOSIS — Z8249 Family history of ischemic heart disease and other diseases of the circulatory system: Secondary | ICD-10-CM

## 2015-04-01 DIAGNOSIS — I2510 Atherosclerotic heart disease of native coronary artery without angina pectoris: Secondary | ICD-10-CM

## 2015-04-01 DIAGNOSIS — Z951 Presence of aortocoronary bypass graft: Secondary | ICD-10-CM

## 2015-04-01 DIAGNOSIS — I739 Peripheral vascular disease, unspecified: Secondary | ICD-10-CM | POA: Diagnosis present

## 2015-04-01 DIAGNOSIS — I2699 Other pulmonary embolism without acute cor pulmonale: Secondary | ICD-10-CM

## 2015-04-01 DIAGNOSIS — I4891 Unspecified atrial fibrillation: Secondary | ICD-10-CM | POA: Diagnosis not present

## 2015-04-01 DIAGNOSIS — M549 Dorsalgia, unspecified: Secondary | ICD-10-CM | POA: Diagnosis present

## 2015-04-01 DIAGNOSIS — I6522 Occlusion and stenosis of left carotid artery: Secondary | ICD-10-CM | POA: Diagnosis present

## 2015-04-01 DIAGNOSIS — K5900 Constipation, unspecified: Secondary | ICD-10-CM | POA: Diagnosis not present

## 2015-04-01 DIAGNOSIS — I10 Essential (primary) hypertension: Secondary | ICD-10-CM | POA: Diagnosis present

## 2015-04-01 DIAGNOSIS — R0602 Shortness of breath: Secondary | ICD-10-CM | POA: Diagnosis not present

## 2015-04-01 DIAGNOSIS — I2584 Coronary atherosclerosis due to calcified coronary lesion: Secondary | ICD-10-CM | POA: Diagnosis present

## 2015-04-01 DIAGNOSIS — Z86711 Personal history of pulmonary embolism: Secondary | ICD-10-CM

## 2015-04-01 DIAGNOSIS — Z87891 Personal history of nicotine dependence: Secondary | ICD-10-CM

## 2015-04-01 DIAGNOSIS — Z96642 Presence of left artificial hip joint: Secondary | ICD-10-CM | POA: Diagnosis present

## 2015-04-01 DIAGNOSIS — G8929 Other chronic pain: Secondary | ICD-10-CM | POA: Diagnosis present

## 2015-04-01 HISTORY — DX: Disorder of kidney and ureter, unspecified: N28.9

## 2015-04-01 LAB — BASIC METABOLIC PANEL
Anion gap: 12 (ref 5–15)
BUN: 15 mg/dL (ref 6–23)
CO2: 24 mmol/L (ref 19–32)
Calcium: 9.3 mg/dL (ref 8.4–10.5)
Chloride: 105 mmol/L (ref 96–112)
Creatinine, Ser: 1.17 mg/dL (ref 0.50–1.35)
GFR calc Af Amer: 70 mL/min — ABNORMAL LOW (ref >90)
GFR, EST NON AFRICAN AMERICAN: 60 mL/min — AB (ref >90)
GLUCOSE: 146 mg/dL — AB (ref 70–99)
POTASSIUM: 4.2 mmol/L (ref 3.5–5.1)
Sodium: 141 mmol/L (ref 135–145)

## 2015-04-01 LAB — CBC
HCT: 45 % (ref 39.0–52.0)
HEMOGLOBIN: 15 g/dL (ref 13.0–17.0)
MCH: 31.4 pg (ref 26.0–34.0)
MCHC: 33.3 g/dL (ref 30.0–36.0)
MCV: 94.1 fL (ref 78.0–100.0)
Platelets: 272 10*3/uL (ref 150–400)
RBC: 4.78 MIL/uL (ref 4.22–5.81)
RDW: 12.8 % (ref 11.5–15.5)
WBC: 9.4 10*3/uL (ref 4.0–10.5)

## 2015-04-01 LAB — I-STAT TROPONIN, ED: Troponin i, poc: 0.03 ng/mL (ref 0.00–0.08)

## 2015-04-01 LAB — TROPONIN I
Troponin I: 0.05 ng/mL — ABNORMAL HIGH (ref <0.031)
Troponin I: 0.05 ng/mL — ABNORMAL HIGH (ref <0.031)

## 2015-04-01 LAB — BRAIN NATRIURETIC PEPTIDE: B NATRIURETIC PEPTIDE 5: 359.1 pg/mL — AB (ref 0.0–100.0)

## 2015-04-01 MED ORDER — ASPIRIN 81 MG PO CHEW
324.0000 mg | CHEWABLE_TABLET | Freq: Once | ORAL | Status: AC
  Administered 2015-04-01: 324 mg via ORAL
  Filled 2015-04-01: qty 4

## 2015-04-01 MED ORDER — ONDANSETRON HCL 4 MG/2ML IJ SOLN
4.0000 mg | Freq: Four times a day (QID) | INTRAMUSCULAR | Status: DC | PRN
  Administered 2015-04-02: 4 mg via INTRAVENOUS
  Filled 2015-04-01 (×2): qty 2

## 2015-04-01 MED ORDER — HYDROCODONE-ACETAMINOPHEN 5-325 MG PO TABS: 1.0000 | ORAL_TABLET | Freq: Four times a day (QID) | ORAL | Status: DC | PRN

## 2015-04-01 MED ORDER — ACETAMINOPHEN 325 MG PO TABS
650.0000 mg | ORAL_TABLET | ORAL | Status: DC | PRN
  Filled 2015-04-01: qty 2

## 2015-04-01 MED ORDER — NITROGLYCERIN 0.4 MG SL SUBL
0.4000 mg | SUBLINGUAL_TABLET | SUBLINGUAL | Status: DC | PRN
  Administered 2015-04-01: 0.4 mg via SUBLINGUAL
  Filled 2015-04-01 (×2): qty 1

## 2015-04-01 MED ORDER — SODIUM CHLORIDE 0.9 % IJ SOLN
3.0000 mL | Freq: Two times a day (BID) | INTRAMUSCULAR | Status: DC
  Administered 2015-04-01 – 2015-04-04 (×5): 3 mL via INTRAVENOUS

## 2015-04-01 MED ORDER — NITROGLYCERIN 0.4 MG SL SUBL: 0.4000 mg | SUBLINGUAL_TABLET | SUBLINGUAL | Status: DC | PRN

## 2015-04-01 MED ORDER — ASPIRIN EC 81 MG PO TBEC
81.0000 mg | DELAYED_RELEASE_TABLET | Freq: Every day | ORAL | Status: DC
  Administered 2015-04-02 – 2015-04-04 (×2): 81 mg via ORAL
  Filled 2015-04-01 (×4): qty 1

## 2015-04-01 MED ORDER — RIVAROXABAN (XARELTO) VTE STARTER PACK (15 & 20 MG): 15.0000 mg | ORAL_TABLET | Freq: Two times a day (BID) | ORAL | Status: DC

## 2015-04-01 MED ORDER — IOHEXOL 350 MG/ML SOLN
80.0000 mL | Freq: Once | INTRAVENOUS | Status: AC | PRN
  Administered 2015-04-01: 80 mL via INTRAVENOUS

## 2015-04-01 MED ORDER — METOPROLOL TARTRATE 12.5 MG HALF TABLET
12.5000 mg | ORAL_TABLET | Freq: Two times a day (BID) | ORAL | Status: DC
Start: 2015-04-01 — End: 2015-04-05
  Administered 2015-04-01 – 2015-04-04 (×8): 12.5 mg via ORAL
  Filled 2015-04-01 (×10): qty 1

## 2015-04-01 MED ORDER — ROSUVASTATIN CALCIUM 20 MG PO TABS
20.0000 mg | ORAL_TABLET | Freq: Every day | ORAL | Status: DC
  Administered 2015-04-01 – 2015-04-09 (×8): 20 mg via ORAL
  Filled 2015-04-01 (×10): qty 1

## 2015-04-01 MED ORDER — HEPARIN (PORCINE) IN NACL 100-0.45 UNIT/ML-% IJ SOLN
1150.0000 [IU]/h | INTRAMUSCULAR | Status: DC
  Filled 2015-04-01: qty 250

## 2015-04-01 MED ORDER — TAMSULOSIN HCL 0.4 MG PO CAPS
0.4000 mg | ORAL_CAPSULE | Freq: Every day | ORAL | Status: DC
  Administered 2015-04-01 – 2015-04-04 (×4): 0.4 mg via ORAL
  Filled 2015-04-01 (×5): qty 1

## 2015-04-01 MED ORDER — SODIUM CHLORIDE 0.9 % IV SOLN
250.0000 mL | INTRAVENOUS | Status: DC | PRN
Start: 2015-04-01 — End: 2015-04-05

## 2015-04-01 MED ORDER — AMLODIPINE BESYLATE 5 MG PO TABS
5.0000 mg | ORAL_TABLET | Freq: Every day | ORAL | Status: DC
  Administered 2015-04-01 – 2015-04-04 (×4): 5 mg via ORAL
  Filled 2015-04-01 (×5): qty 1

## 2015-04-01 MED ORDER — HEPARIN (PORCINE) IN NACL 100-0.45 UNIT/ML-% IJ SOLN
1200.0000 [IU]/h | INTRAMUSCULAR | Status: DC
  Administered 2015-04-02: 1200 [IU]/h via INTRAVENOUS
  Administered 2015-04-02: 1150 [IU]/h via INTRAVENOUS
  Filled 2015-04-01 (×4): qty 250

## 2015-04-01 MED ORDER — SODIUM CHLORIDE 0.9 % IJ SOLN: 3.0000 mL | INTRAMUSCULAR | Status: DC | PRN

## 2015-04-01 MED ORDER — RIVAROXABAN 15 MG PO TABS
15.0000 mg | ORAL_TABLET | Freq: Two times a day (BID) | ORAL | Status: DC
  Administered 2015-04-01: 15 mg via ORAL
  Filled 2015-04-01 (×2): qty 1

## 2015-04-01 NOTE — Telephone Encounter (Signed)
Received call directly from operator.  Patient c/o chest pain radiating into his back and arm, SOB, nausea intermittent through the weekend.  Denies vomiting.  Patient was recently released from [facility] and was scheduled for TCM visit with PA today.  Patient is tearful and states he just left the hospital.  I advised patient that due to possible severity of symptoms with CAD that he will benefit most by going to [facility].  I advised him to call 911 for transport.  Patient verbalized understanding and agreement.  Cardiology card master is aware

## 2015-04-01 NOTE — H&P (Signed)
Chief Complaint:  Chest pain  HPI:  This is a 74 y.o. male with a past medical history significant for 2 previous episodes of acute myocardial infarction and a recent admission for chest pain felt to be secondary to pulmonary embolism (intermediate probability V/Q study with single segmental right middle lobe unmatched perfusion defect) about 10 days ago. He is taking Xarelto for anticoagulation. He returns today for recurrent sharp right-sided chest pain that woke him from sleep at 5 AM and is still occasionally occurring for brief periods of time. It was associated with dyspnea and dizziness, but not nausea palpitations syncope or diaphoresis.  The current chest pain is described as sharp, brief  and radiating from the front of the  right chest to the middle of his right shoulder blade. He also describes his previous myocardial infarction as sharp, but it was located in the center of his chest and had a much more lingering quality. He also had severe nausea with his previous heart attacks.   He has not had any obvious swelling or tenderness in either lower extremity. He has easy bruising from the anticoagulant, but has not had any overt bleeding. He denies fever, chills, persistent dyspnea, orthopnea, PND, cough or hemoptysis.   He had a very recent nuclear stress test in early March that showed an extensive area of scar in the inferior and inferolateral walls, mildly depressed left ventricular systolic function, no evidence of reversible ischemia. His most recent cardiac catheterization  in 2010 showed total occlusion of the right coronary artery, total occlusion of the left circumflex coronary artery following the first OM and a high-grade ostial stenosis of the diagonal branch of the LAD artery.  the LAD artery had diffuse 30-40 percent stenoses and provided collaterals to both the RCA and distal circumflex.His electrocardiogram today shows nonspecific ST-T wave abnormalities that are minimallyent  from previous tracings. Cardiac enzymes are normal.   He has essentially normal renal function. I'm not sure why VQ scan is preferred over a CT angiogram at his last hospital visit. He did not have a lower extremity venous Dopplers. His d-dimer was abnormal.an echocardiogram was ordered, but not performed by the time of discharge   PMHx:  Past Medical History  Diagnosis Date  . Arthritis   . Myocardial infarction     70    dr Brigitte Pulse PCP          . Hyperlipidemia     takes Crestor daily  . Peripheral vascular disease   . Carotid artery occlusion   . Hypertension     takes Amlodipine daily  . Chronic back pain   . History of gout     has colchicine prn  . History of colon polyps   . Urinary frequency   . Urinary urgency   . Enlarged prostate     takes Rapaflo daily  . History of kidney stones   . Hyperthyroidism     takes SYnthroid daily as instructed  . Cataract     Bil eyes/worse in left eye  . GERD (gastroesophageal reflux disease)     occasional  . Pulmonary emboli 03/20/2015    elevated d-dimer, intermediate V/Q study, atypical chest pain and SOB. Start on Xarelto 50m BID for 3 month    Past Surgical History  Procedure Laterality Date  . Femoral artery - popliteal artery bypass graft    . Joint replacement      shoulder  . Back surgery      5  times  . Lumbar laminectomy/decompression microdiscectomy  02/12/2012    Procedure: LUMBAR LAMINECTOMY/DECOMPRESSION MICRODISCECTOMY;  Surgeon: Floyce Stakes, MD;  Location: [place] [facility];  Service: Neurosurgery;  Laterality: N/A;  Lumbar four-five laminectomy  . Appendectomy    . Cataract surgery      left eye  . Colonoscopy    . Big toe surgery    . Cystoscopy    . Lumbar laminectomy  01/06/2013    Procedure: MICRODISCECTOMY LUMBAR LAMINECTOMY;  Surgeon: Marybelle Killings, MD;  Location: [place];  Service: Orthopedics;  Laterality: N/A;  L3-4 decompression  . Cardiac catheterization      2010    dr Acie Fredrickson  . Eye surgery    .  Total hip arthroplasty Left 01/09/2014    DR Ninfa Linden  . Total hip arthroplasty Left 01/09/2014    Procedure: LEFT TOTAL HIP ARTHROPLASTY ANTERIOR APPROACH and Steroid Injection Right hip;  Surgeon: Mcarthur Rossetti, MD;  Location: [place];  Service: Orthopedics;  Laterality: Left;  . Steriod injection Right 01/09/2014    Procedure: STEROID INJECTION;  Surgeon: Mcarthur Rossetti, MD;  Location: [place];  Service: Orthopedics;  Laterality: Right;    FAMHx:  Family History  Problem Relation Age of Onset  . Heart disease Father   . Heart attack Father   . Heart disease Sister   . Hypertension Mother   . Diabetes Son     SOCHx:   reports that he quit smoking about 28 years ago. His smoking use included Cigarettes. He has quit using smokeless tobacco. His smokeless tobacco use included Chew. He reports that he does not drink alcohol or use illicit drugs.  ALLERGIES:  Allergies  Allergen Reactions  . Codeine Nausea And Vomiting         ROS:  The patient specifically denies orthopnea, paroxysmal nocturnal dyspnea, syncope, palpitations, focal neurological deficits, intermittent claudication, lower extremity edema, unexplained weight gain, cough, hemoptysis or wheezing.  The patient also denies abdominal pain, nausea, vomiting, dysphagia, diarrhea, constipation, polyuria, polydipsia, dysuria, hematuria, frequency, urgency, abnormal bleeding or bruising, fever, chills, unexpected weight changes, mood swings, change in skin or hair texture, change in voice quality, auditory or visual problems, allergic reactions or rashes, new musculoskeletal complaints other than usual "aches and pains".   HOME MEDS: No current facility-administered medications on file prior to encounter.   Current Outpatient Prescriptions on File Prior to Encounter  Medication Sig Dispense Refill  . amLODipine (NORVASC) 5 MG tablet Take 5 mg by mouth at bedtime.     . nitroGLYCERIN (NITROSTAT) 0.4 MG SL tablet  Place 1 tablet (0.4 mg total) under the tongue every 5 (five) minutes as needed for chest pain. 25 tablet 6  . Rivaroxaban (XARELTO STARTER PACK) 15 & 20 MG TBPK Take as directed on package: Start with one 31m tablet by mouth twice a day with food. On Day 22, switch to one 224mtablet once a day with food. 51 each 0  . rivaroxaban (XARELTO) 20 MG TABS tablet Take 1 tablet (20 mg total) by mouth daily with supper. 30 tablet 1  . rosuvastatin (CRESTOR) 20 MG tablet Take 1 tablet (20 mg total) by mouth daily. (Patient taking differently: Take 20 mg by mouth daily at 6 PM. ) 90 tablet 3  . silodosin (RAPAFLO) 8 MG CAPS capsule Take 8 mg by mouth at bedtime.     . Marland KitchenYDROcodone-acetaminophen (NORCO) 5-325 MG per tablet Take 1-2 tablets by mouth every 6 (six) hours as needed  for moderate pain or severe pain. (Patient not taking: Reported on 04/01/2015) 60 tablet 0  . metoprolol tartrate (LOPRESSOR) 25 MG tablet Take 0.5 tablets (12.5 mg total) by mouth 2 (two) times daily. (Patient not taking: Reported on 04/01/2015) 30 tablet 5    LABS/IMAGING: Results for orders placed or performed during the hospital encounter of 04/01/15 (from the past 48 hour(s))  CBC     Status: None   Collection Time: 04/01/15  8:59 AM  Result Value Ref Range   WBC 9.4 4.0 - 10.5 K/uL   RBC 4.78 4.22 - 5.81 MIL/uL   Hemoglobin 15.0 13.0 - 17.0 g/dL   HCT 45.0 39.0 - 52.0 %   MCV 94.1 78.0 - 100.0 fL   MCH 31.4 26.0 - 34.0 pg   MCHC 33.3 30.0 - 36.0 g/dL   RDW 12.8 11.5 - 15.5 %   Platelets 272 150 - 400 K/uL  Basic metabolic panel     Status: Abnormal   Collection Time: 04/01/15  8:59 AM  Result Value Ref Range   Sodium 141 135 - 145 mmol/L   Potassium 4.2 3.5 - 5.1 mmol/L   Chloride 105 96 - 112 mmol/L   CO2 24 19 - 32 mmol/L   Glucose, Bld 146 (H) 70 - 99 mg/dL   BUN 15 6 - 23 mg/dL   Creatinine, Ser 1.17 0.50 - 1.35 mg/dL   Calcium 9.3 8.4 - 10.5 mg/dL   GFR calc non Af Amer 60 (L) >90 mL/min   GFR calc Af Amer 70  (L) >90 mL/min    Comment: (NOTE) The eGFR has been calculated using the CKD EPI equation. This calculation has not been validated in all clinical situations. eGFR's persistently <90 mL/min signify possible Chronic Kidney Disease.    Anion gap 12 5 - 15  BNP (order ONLY if patient complains of dyspnea/SOB AND you have documented it for THIS visit)     Status: Abnormal   Collection Time: 04/01/15  8:59 AM  Result Value Ref Range   B Natriuretic Peptide 359.1 (H) 0.0 - 100.0 pg/mL  I-stat troponin, ED (not at [facility])     Status: None   Collection Time: 04/01/15  9:06 AM  Result Value Ref Range   Troponin i, poc 0.03 0.00 - 0.08 ng/mL   Comment 3            Comment: Due to the release kinetics of cTnI, a negative result within the first hours of the onset of symptoms does not rule out myocardial infarction with certainty. If myocardial infarction is still suspected, repeat the test at appropriate intervals.    Dg Chest 2 View (if Patient Has Fever And/or Copd)  04/01/2015   CLINICAL DATA:  Right-sided chest pain  EXAM: CHEST  2 VIEW  COMPARISON:  03/20/2015  FINDINGS: Cardiac shadow is stable. The lungs are well aerated bilaterally. No focal infiltrate or sizable effusion is seen postsurgical changes in left shoulder are again noted.  IMPRESSION: No active cardiopulmonary disease.   Electronically Signed   By: Inez Catalina M.D.   On: 04/01/2015 09:46    VITALS: Blood pressure 122/72, pulse 78, temperature 98 F (36.7 C), temperature source Oral, resp. rate 17, height _0  (1.676 m), weight 180 lb (81.647 kg), SpO2 97 %.  EXAM:  General: Alert, oriented x3, no distress Head: no evidence of trauma, PERRL, EOMI, no exophtalmos or lid lag, no myxedema, no xanthelasma; normal ears, nose and oropharynx Neck: normal jugular  venous pulsations and no hepatojugular reflux; brisk carotid pulses without delay and no carotid bruits Chest: clear to auscultation, no signs of consolidation by  percussion or palpation, normal fremitus, symmetrical and full respiratory excursions Cardiovascular: normal position and quality of the apical impulse, regular rhythm, normal first heart sound and normal second heart sounds, no rubs or gallops, nomurmur Abdomen: no tenderness or distention, no masses by palpation, no abnormal pulsatility or arterial bruits, normal bowel sounds, no hepatosplenomegaly Extremities: no clubbing, cyanosis or edema;  scars of previous aortobifemoral bypass 2+ radial, ulnar and brachial pulses bilaterally; 2+ right femoral, posterior tibial and dorsalis pedis pulses; 2+ left femoral, posterior tibial and dorsalis pedis pulses; no subclavian bruit; has bilateral femoral bruits Neurological: grossly nonfocal   IMPRESSION:  I suspect Mr. Kornegay current chest pain reflects recent pulmonary infarction, but it is worrisome that it is occurring after more than a week of presumably  therapeutic anticoagulation.  Alternatively, it is possible that his discomfort represents myocardial ischemia. With total occlusion of both the left circumflex and right coronary arteries, a nuclear perfusion study may not be the best way to assess for myocardial ischemia. A proximal LAD stenosis would cause global ischemia, not recognizable by perfusion study.  PLAN:  Lower extremity venous Dopplers and CT angio of the chest. If thrombus is identified, may need to switch to alternative anticoagulant; need to clarify whether he has been taking the initial 15 mg twice a day dose of Xarelto or has switched prematurely to 20 mg once daily.   If there is no convincing evidence of ongoing problems with venous thromboembolism, I would advocate holding his anticoagulation and proceeding to coronary angiography, to make sure that he does not have a proximal LAD stenosis that is causing his symptoms.  Sanda Klein, MD, [facility] [phone] office [phone] pager  04/01/2015, 10:59  AM

## 2015-04-01 NOTE — Telephone Encounter (Signed)
New message     Pt c/o of Chest Pain: STAT if CP now or developed within 24 hours  1. Are you having CP right now? Yes in his back  2. Are you experiencing any other symptoms (ex. SOB, nausea, vomiting, sweating)? sob  3. How long have you been experiencing CP?  4. Is your CP continuous or coming and going? Comes and goes  5. Have you taken Nitroglycerin? Not yet Released from hosp last week ?

## 2015-04-01 NOTE — ED Notes (Signed)
Pt c/o right sided chest pain that radiates to right shoulder and back with Nausea and dizziness x 2 weeks.

## 2015-04-01 NOTE — ED Notes (Signed)
Attempted report 

## 2015-04-01 NOTE — ED Provider Notes (Signed)
CSN: [id number]     Arrival date & time 04/01/15  5638 History   First MD Initiated Contact with Patient 04/01/15 [phone]     Chief Complaint  Patient presents with  . Chest Pain  . Back Pain  . Dizziness     Patient is a 74 y.o. male presenting with chest pain, back pain, and dizziness. The history is provided by the patient. No language interpreter was used.  Chest Pain Associated symptoms: back pain and dizziness   Back Pain Associated symptoms: chest pain   Dizziness Associated symptoms: chest pain    Ryan [name] presents with evaluation of chest pain. He's had 1-2 weeks of intermittent right-sided chest pain. The pain is located in the central and right chest and radiates straight through to his back. It is described as sharp in nature and waxing and waning. The pain is worse with exertion as well as intermittently it occurs at rest. He has associated shortness of breath and is unable to ambulate without significant shortness of breath. He denies any fevers, cough, abdominal pain, vomiting, diarrhea, leg pain. He was recently seen in the hospital for the same symptoms and had a cardiac catheter was told it was blood clots. He was started on Xarelto. He presents today because the pain is getting worse. He is taking his Xarelto as prescribed.  Past Medical History  Diagnosis Date  . Arthritis   . Myocardial infarction     59    dr Brigitte Pulse PCP          . Hyperlipidemia     takes Crestor daily  . Peripheral vascular disease   . Carotid artery occlusion   . Hypertension     takes Amlodipine daily  . Chronic back pain   . History of gout     has colchicine prn  . History of colon polyps   . Urinary frequency   . Urinary urgency   . Enlarged prostate     takes Rapaflo daily  . History of kidney stones   . Hyperthyroidism     takes SYnthroid daily as instructed  . Cataract     Bil eyes/worse in left eye  . GERD (gastroesophageal reflux disease)     occasional  . Pulmonary emboli  03/20/2015    elevated d-dimer, intermediate V/Q study, atypical chest pain and SOB. Start on Xarelto 20mg  BID for 3 month   Past Surgical History  Procedure Laterality Date  . Femoral artery - popliteal artery bypass graft    . Joint replacement      shoulder  . Back surgery      5 times  . Lumbar laminectomy/decompression microdiscectomy  02/12/2012    Procedure: LUMBAR LAMINECTOMY/DECOMPRESSION MICRODISCECTOMY;  Surgeon: Floyce Stakes, MD;  Location: [place] [facility];  Service: Neurosurgery;  Laterality: N/A;  Lumbar four-five laminectomy  . Appendectomy    . Cataract surgery      left eye  . Colonoscopy    . Big toe surgery    . Cystoscopy    . Lumbar laminectomy  01/06/2013    Procedure: MICRODISCECTOMY LUMBAR LAMINECTOMY;  Surgeon: Marybelle Killings, MD;  Location: [place];  Service: Orthopedics;  Laterality: N/A;  L3-4 decompression  . Cardiac catheterization      2010    dr Acie Fredrickson  . Eye surgery    . Total hip arthroplasty Left 01/09/2014    DR Ninfa Linden  . Total hip arthroplasty Left 01/09/2014    Procedure: LEFT TOTAL HIP  ARTHROPLASTY ANTERIOR APPROACH and Steroid Injection Right hip;  Surgeon: Mcarthur Rossetti, MD;  Location: [place];  Service: Orthopedics;  Laterality: Left;  . Steriod injection Right 01/09/2014    Procedure: STEROID INJECTION;  Surgeon: Mcarthur Rossetti, MD;  Location: [place];  Service: Orthopedics;  Laterality: Right;   Family History  Problem Relation Age of Onset  . Heart disease Father   . Heart attack Father   . Heart disease Sister   . Hypertension Mother   . Diabetes Son    History  Substance Use Topics  . Smoking status: Former Smoker    Types: Cigarettes    Quit date: 02/04/1987  . Smokeless tobacco: Former Systems developer    Types: Chew     Comment: quit 35+yrs ago  . Alcohol Use: No    Review of Systems  Cardiovascular: Positive for chest pain.  Musculoskeletal: Positive for back pain.  Neurological: Positive for dizziness.  All other  systems reviewed and are negative.     Allergies  Codeine  Home Medications   Prior to Admission medications   Medication Sig Start Date End Date Taking? Authorizing Provider  amLODipine (NORVASC) 5 MG tablet Take 5 mg by mouth at bedtime.     Historical Provider, MD  colchicine 0.6 MG tablet Take 0.6 mg by mouth daily.    Historical Provider, MD  diclofenac sodium (VOLTAREN) 1 % GEL Apply 2 g topically every 6 (six) hours as needed (for pain).    Historical Provider, MD  HYDROcodone-acetaminophen (NORCO) 5-325 MG per tablet Take 1-2 tablets by mouth every 6 (six) hours as needed for moderate pain or severe pain. 01/12/14   Mcarthur Rossetti, MD  metoprolol tartrate (LOPRESSOR) 25 MG tablet Take 0.5 tablets (12.5 mg total) by mouth 2 (two) times daily. 03/21/15   Almyra Deforest, PA  nitroGLYCERIN (NITROSTAT) 0.4 MG SL tablet Place 1 tablet (0.4 mg total) under the tongue every 5 (five) minutes as needed for chest pain. 02/22/15   Thayer Headings, MD  Rivaroxaban (XARELTO STARTER PACK) 15 & 20 MG TBPK Take as directed on package: Start with one 15mg  tablet by mouth twice a day with food. On Day 22, switch to one 20mg  tablet once a day with food. 03/21/15   Almyra Deforest, PA  rivaroxaban (XARELTO) 20 MG TABS tablet Take 1 tablet (20 mg total) by mouth daily with supper. 03/21/15   Almyra Deforest, PA  rosuvastatin (CRESTOR) 20 MG tablet Take 1 tablet (20 mg total) by mouth daily. 03/21/15   Almyra Deforest, PA  silodosin (RAPAFLO) 8 MG CAPS capsule Take 8 mg by mouth at bedtime.     Historical Provider, MD   BP 180/82 mmHg  Pulse 88  Temp(Src) 98 F (36.7 C) (Oral)  Resp 18  Ht 5\' 6"  (1.676 m)  Wt 180 lb (81.647 kg)  BMI 29.07 kg/m2  SpO2 97% Physical Exam  Constitutional: He is oriented to person, place, and time. He appears well-developed and well-nourished.  HENT:  Head: Normocephalic and atraumatic.  Cardiovascular: Normal rate, regular rhythm and intact distal pulses.   No murmur  heard. Pulmonary/Chest: Effort normal and breath sounds normal. No respiratory distress.  Abdominal: Soft. There is no tenderness. There is no rebound and no guarding.  Musculoskeletal: He exhibits no edema or tenderness.  Neurological: He is alert and oriented to person, place, and time.  Skin: Skin is warm and dry.  Psychiatric: He has a normal mood and affect. His behavior is normal.  Nursing note and vitals reviewed.   ED Course  Procedures (including critical care time) Labs Review Labs Reviewed  BASIC METABOLIC PANEL - Abnormal; Notable for the following:    Glucose, Bld 146 (*)    GFR calc non Af Amer 60 (*)    GFR calc Af Amer 70 (*)    All other components within normal limits  BRAIN NATRIURETIC PEPTIDE - Abnormal; Notable for the following:    B Natriuretic Peptide 359.1 (*)    All other components within normal limits  CBC  I-STAT TROPOININ, ED    Imaging Review Dg Chest 2 View (if Patient Has Fever And/or Copd)  04/01/2015   CLINICAL DATA:  Right-sided chest pain  EXAM: CHEST  2 VIEW  COMPARISON:  03/20/2015  FINDINGS: Cardiac shadow is stable. The lungs are well aerated bilaterally. No focal infiltrate or sizable effusion is seen postsurgical changes in left shoulder are again noted.  IMPRESSION: No active cardiopulmonary disease.   Electronically Signed   By: Inez Catalina M.D.   On: 04/01/2015 09:46     EKG Interpretation   Date/Time:  Monday April 01 2015 08:48:07 EDT Ventricular Rate:  88 PR Interval:  154 QRS Duration: 82 QT Interval:  348 QTC Calculation: 421 R Axis:   31 Text Interpretation:  Sinus rhythm with occasional Premature ventricular  complexes Cannot rule out Anterior infarct , age undetermined Abnormal ECG  Confirmed by Hazle Coca [phone]) on 04/01/2015 9:25:01 AM      MDM   Final diagnoses:  Chest pain, unspecified chest pain type    Patient here for evaluation of acute on chronic progressive chest pain. He recently had an evaluation  for similar symptoms and had an intermediate probability VQ scan and was started on Xarelto. Discussed with cardiology given patient's crescendo symptoms and ongoing anticoagulation.   Quintella Reichert, MD 04/01/15 1109

## 2015-04-01 NOTE — Progress Notes (Addendum)
ANTICOAGULATION CONSULT NOTE - Initial Consult  Pharmacy Consult for Heparin (pt on Xarelto PTA) Indication: CP, r/o ACS  Allergies  Allergen Reactions  . Codeine Nausea And Vomiting         Patient Measurements: Height: 5\' 6"  (167.6 cm) Weight: 180 lb (81.647 kg) IBW/kg (Calculated) : 63.8 Heparin Dosing Weight: 81 kg  Vital Signs: Temp: 97.5 F (36.4 C) (04/11 1324) Temp Source: Oral (04/11 1324) BP: 147/81 mmHg (04/11 1324) Pulse Rate: 80 (04/11 1324)  Labs:  Recent Labs  04/01/15 0859 04/01/15 1515  HGB 15.0  --   HCT 45.0  --   PLT 272  --   CREATININE 1.17  --   TROPONINI  --  0.05*    Estimated Creatinine Clearance: 56.4 mL/min (by C-G formula based on Cr of 1.17).   Medical History: Past Medical History  Diagnosis Date  . Arthritis   . Myocardial infarction     4    dr Brigitte Pulse PCP          . Hyperlipidemia     takes Crestor daily  . Peripheral vascular disease   . Carotid artery occlusion   . Hypertension     takes Amlodipine daily  . Chronic back pain   . History of gout     has colchicine prn  . History of colon polyps   . Urinary frequency   . Urinary urgency   . Enlarged prostate     takes Rapaflo daily  . History of kidney stones   . Hyperthyroidism     takes SYnthroid daily as instructed  . Cataract     Bil eyes/worse in left eye  . GERD (gastroesophageal reflux disease)     occasional  . Pulmonary emboli 03/20/2015    elevated d-dimer, intermediate V/Q study, atypical chest pain and SOB. Start on Xarelto 20mg  BID for 3 month  . Renal insufficiency     Medications:  Prescriptions prior to admission  Medication Sig Dispense Refill Last Dose  . amLODipine (NORVASC) 5 MG tablet Take 5 mg by mouth at bedtime.    03/31/2015 at Unknown time  . indomethacin (INDOCIN SR) 75 MG CR capsule Take 75 mg by mouth daily as needed (gout flare).   03/30/2015  . nitroGLYCERIN (NITROSTAT) 0.4 MG SL tablet Place 1 tablet (0.4 mg total) under the tongue  every 5 (five) minutes as needed for chest pain. 25 tablet 6 04/01/2015 at Unknown time  . Rivaroxaban (XARELTO STARTER PACK) 15 & 20 MG TBPK Take as directed on package: Start with one 15mg  tablet by mouth twice a day with food. On Day 22, switch to one 20mg  tablet once a day with food. 51 each 0 03/31/2015 at Unknown time  . rivaroxaban (XARELTO) 20 MG TABS tablet Take 1 tablet (20 mg total) by mouth daily with supper. 30 tablet 1 not yet started  . rosuvastatin (CRESTOR) 20 MG tablet Take 1 tablet (20 mg total) by mouth daily. (Patient taking differently: Take 20 mg by mouth daily at 6 PM. ) 90 tablet 3 03/31/2015 at Unknown time  . silodosin (RAPAFLO) 8 MG CAPS capsule Take 8 mg by mouth at bedtime.    03/31/2015 at Unknown time  . HYDROcodone-acetaminophen (NORCO) 5-325 MG per tablet Take 1-2 tablets by mouth every 6 (six) hours as needed for moderate pain or severe pain. (Patient not taking: Reported on 04/01/2015) 60 tablet 0 Not Taking at Unknown time  . metoprolol tartrate (LOPRESSOR) 25 MG tablet Take  0.5 tablets (12.5 mg total) by mouth 2 (two) times daily. (Patient not taking: Reported on 04/01/2015) 30 tablet 5 Not Taking at Unknown time    Assessment: 74 y.o. male presents with CP. Noted pt with recent PE - intermediate probability on VQ scan. Pt treated with heparin at last admit then discharged on Xarelto 15mg  po BID on 3/31. Verified with patient that he is taking the 15mg  po BID and that last dose was taken 4/10 p.m. at home. RN just gave Xarelto 15mg  ~1713 to patient as I was working him up. CT scan negative for PE. To hold Xarelto and begin heparin gtt for r/o ACS. Will obtain baseline aPTT and heparin level in a.m. as Xarelto will likely still be affecting heparin (anti-Xa level) for the next 24-48 hours.  Noted pt with heparin level 0.29 (slightly subtherapeutic) on 1100 units/hr during last admission.  Goal of Therapy:  Heparin level 0.3-0.7 units/ml; aPTT 66-102 sec Monitor  platelets by anticoagulation protocol: Yes   Plan:  Baseline aPTT and heparin level with a.m. labs Begin heparin gtt at 1150 units/hr at 0700 on 4/12 (this is when next Xarelto po dose would be due) Will f/u baseline labs to order f/u labs  Sherlon Handing, PharmD, BCPS Clinical pharmacist, pager [phone] 04/01/2015,6:04 PM

## 2015-04-02 DIAGNOSIS — N400 Enlarged prostate without lower urinary tract symptoms: Secondary | ICD-10-CM | POA: Diagnosis present

## 2015-04-02 DIAGNOSIS — M1990 Unspecified osteoarthritis, unspecified site: Secondary | ICD-10-CM | POA: Diagnosis present

## 2015-04-02 DIAGNOSIS — J9811 Atelectasis: Secondary | ICD-10-CM | POA: Diagnosis not present

## 2015-04-02 DIAGNOSIS — Z86711 Personal history of pulmonary embolism: Secondary | ICD-10-CM | POA: Diagnosis not present

## 2015-04-02 DIAGNOSIS — I4581 Long QT syndrome: Secondary | ICD-10-CM | POA: Diagnosis not present

## 2015-04-02 DIAGNOSIS — Z96642 Presence of left artificial hip joint: Secondary | ICD-10-CM | POA: Diagnosis present

## 2015-04-02 DIAGNOSIS — I25110 Atherosclerotic heart disease of native coronary artery with unstable angina pectoris: Secondary | ICD-10-CM | POA: Diagnosis not present

## 2015-04-02 DIAGNOSIS — E785 Hyperlipidemia, unspecified: Secondary | ICD-10-CM | POA: Diagnosis not present

## 2015-04-02 DIAGNOSIS — E0590 Thyrotoxicosis, unspecified without thyrotoxic crisis or storm: Secondary | ICD-10-CM | POA: Diagnosis present

## 2015-04-02 DIAGNOSIS — E8770 Fluid overload, unspecified: Secondary | ICD-10-CM | POA: Diagnosis not present

## 2015-04-02 DIAGNOSIS — K5900 Constipation, unspecified: Secondary | ICD-10-CM | POA: Diagnosis not present

## 2015-04-02 DIAGNOSIS — Z95828 Presence of other vascular implants and grafts: Secondary | ICD-10-CM | POA: Diagnosis not present

## 2015-04-02 DIAGNOSIS — I4891 Unspecified atrial fibrillation: Secondary | ICD-10-CM | POA: Diagnosis not present

## 2015-04-02 DIAGNOSIS — I2582 Chronic total occlusion of coronary artery: Secondary | ICD-10-CM | POA: Diagnosis not present

## 2015-04-02 DIAGNOSIS — I25119 Atherosclerotic heart disease of native coronary artery with unspecified angina pectoris: Secondary | ICD-10-CM | POA: Diagnosis not present

## 2015-04-02 DIAGNOSIS — I10 Essential (primary) hypertension: Secondary | ICD-10-CM | POA: Diagnosis present

## 2015-04-02 DIAGNOSIS — I2584 Coronary atherosclerosis due to calcified coronary lesion: Secondary | ICD-10-CM | POA: Diagnosis not present

## 2015-04-02 DIAGNOSIS — I252 Old myocardial infarction: Secondary | ICD-10-CM | POA: Diagnosis not present

## 2015-04-02 DIAGNOSIS — D62 Acute posthemorrhagic anemia: Secondary | ICD-10-CM | POA: Diagnosis not present

## 2015-04-02 DIAGNOSIS — I2699 Other pulmonary embolism without acute cor pulmonale: Secondary | ICD-10-CM | POA: Diagnosis not present

## 2015-04-02 DIAGNOSIS — G8929 Other chronic pain: Secondary | ICD-10-CM | POA: Diagnosis present

## 2015-04-02 DIAGNOSIS — Z87891 Personal history of nicotine dependence: Secondary | ICD-10-CM | POA: Diagnosis not present

## 2015-04-02 DIAGNOSIS — Z8249 Family history of ischemic heart disease and other diseases of the circulatory system: Secondary | ICD-10-CM | POA: Diagnosis not present

## 2015-04-02 DIAGNOSIS — I6522 Occlusion and stenosis of left carotid artery: Secondary | ICD-10-CM | POA: Diagnosis present

## 2015-04-02 DIAGNOSIS — R918 Other nonspecific abnormal finding of lung field: Secondary | ICD-10-CM | POA: Diagnosis not present

## 2015-04-02 DIAGNOSIS — I739 Peripheral vascular disease, unspecified: Secondary | ICD-10-CM | POA: Diagnosis present

## 2015-04-02 DIAGNOSIS — Z885 Allergy status to narcotic agent status: Secondary | ICD-10-CM | POA: Diagnosis not present

## 2015-04-02 DIAGNOSIS — M549 Dorsalgia, unspecified: Secondary | ICD-10-CM | POA: Diagnosis present

## 2015-04-02 DIAGNOSIS — R079 Chest pain, unspecified: Secondary | ICD-10-CM | POA: Diagnosis not present

## 2015-04-02 DIAGNOSIS — Z7902 Long term (current) use of antithrombotics/antiplatelets: Secondary | ICD-10-CM | POA: Diagnosis not present

## 2015-04-02 DIAGNOSIS — I2510 Atherosclerotic heart disease of native coronary artery without angina pectoris: Secondary | ICD-10-CM | POA: Diagnosis not present

## 2015-04-02 DIAGNOSIS — Z951 Presence of aortocoronary bypass graft: Secondary | ICD-10-CM | POA: Diagnosis not present

## 2015-04-02 DIAGNOSIS — M109 Gout, unspecified: Secondary | ICD-10-CM | POA: Diagnosis present

## 2015-04-02 DIAGNOSIS — K219 Gastro-esophageal reflux disease without esophagitis: Secondary | ICD-10-CM | POA: Diagnosis present

## 2015-04-02 DIAGNOSIS — G451 Carotid artery syndrome (hemispheric): Secondary | ICD-10-CM | POA: Diagnosis not present

## 2015-04-02 LAB — APTT
APTT: 78 s — AB (ref 24–37)
aPTT: 38 seconds — ABNORMAL HIGH (ref 24–37)
aPTT: 68 seconds — ABNORMAL HIGH (ref 24–37)

## 2015-04-02 LAB — BASIC METABOLIC PANEL
Anion gap: 10 (ref 5–15)
BUN: 16 mg/dL (ref 6–23)
CO2: 26 mmol/L (ref 19–32)
CREATININE: 1.18 mg/dL (ref 0.50–1.35)
Calcium: 8.8 mg/dL (ref 8.4–10.5)
Chloride: 102 mmol/L (ref 96–112)
GFR calc Af Amer: 69 mL/min — ABNORMAL LOW (ref >90)
GFR calc non Af Amer: 59 mL/min — ABNORMAL LOW (ref >90)
Glucose, Bld: 106 mg/dL — ABNORMAL HIGH (ref 70–99)
POTASSIUM: 3.9 mmol/L (ref 3.5–5.1)
Sodium: 138 mmol/L (ref 135–145)

## 2015-04-02 LAB — TROPONIN I
TROPONIN I: 0.04 ng/mL — AB (ref <0.031)
Troponin I: 0.06 ng/mL — ABNORMAL HIGH (ref <0.031)

## 2015-04-02 LAB — CBC
HCT: 40.1 % (ref 39.0–52.0)
Hemoglobin: 13.4 g/dL (ref 13.0–17.0)
MCH: 31.5 pg (ref 26.0–34.0)
MCHC: 33.4 g/dL (ref 30.0–36.0)
MCV: 94.4 fL (ref 78.0–100.0)
PLATELETS: 252 10*3/uL (ref 150–400)
RBC: 4.25 MIL/uL (ref 4.22–5.81)
RDW: 12.9 % (ref 11.5–15.5)
WBC: 9.2 10*3/uL (ref 4.0–10.5)

## 2015-04-02 LAB — HEPARIN LEVEL (UNFRACTIONATED): Heparin Unfractionated: >2.2 IU/mL — ABNORMAL HIGH (ref 0.30–0.70)

## 2015-04-02 MED ORDER — ASPIRIN 81 MG PO CHEW
81.0000 mg | CHEWABLE_TABLET | ORAL | Status: AC
  Administered 2015-04-03: 81 mg via ORAL
  Filled 2015-04-02: qty 1

## 2015-04-02 MED ORDER — SODIUM CHLORIDE 0.9 % IV SOLN: 250.0000 mL | INTRAVENOUS | Status: DC | PRN

## 2015-04-02 MED ORDER — SODIUM CHLORIDE 0.9 % IJ SOLN: 3.0000 mL | INTRAMUSCULAR | Status: DC | PRN

## 2015-04-02 MED ORDER — MORPHINE SULFATE 2 MG/ML IJ SOLN
2.0000 mg | INTRAMUSCULAR | Status: DC | PRN
Start: 2015-04-02 — End: 2015-04-05
  Administered 2015-04-03 – 2015-04-05 (×3): 2 mg via INTRAVENOUS
  Filled 2015-04-02 (×3): qty 1

## 2015-04-02 MED ORDER — SODIUM CHLORIDE 0.9 % IV SOLN
1.0000 mL/kg/h | INTRAVENOUS | Status: DC
  Administered 2015-04-03: 1 mL/kg/h via INTRAVENOUS

## 2015-04-02 MED ORDER — SODIUM CHLORIDE 0.9 % IJ SOLN
3.0000 mL | Freq: Two times a day (BID) | INTRAMUSCULAR | Status: DC
  Administered 2015-04-02: 3 mL via INTRAVENOUS

## 2015-04-02 NOTE — Progress Notes (Signed)
VASCULAR LAB PRELIMINARY  PRELIMINARY  PRELIMINARY  PRELIMINARY  Bilateral lower extremity venous Dopplers completed.    Preliminary report:  There is no DVT or SVT noted in the bilateral lower extremities.   Ryan [name], RVT 04/02/2015, 11:32 AM

## 2015-04-02 NOTE — Progress Notes (Signed)
04/02/15  Pharmacy-  Heparin 2230   APTT 78sec on 1200 units/hr  A/P:  74yo male with chest pain and recent PE on Xarelto pta (last dose 4/10), therefore we are following aPTTs for now.  The PTT is within goal range of 66-102s with no bleeding and no other issues.  Will continue current rate and f/u in AM.  Gracy Bruins, PharmD Clinical Pharmacist [facility]

## 2015-04-02 NOTE — Progress Notes (Addendum)
PROGRESS NOTE  Subjective:    Ryan [name] is a 74 yo with hx of CAD. Recent admission for CP.  + d-dimer.  Intermediate prob. V/Q.  Was thought to have a PE and was discharged on Xarelto. He had a myoview 3/15 that showed an inferiolateral scar which seemed to correspond to his known occluded RCA and distal LCx.  He was admitted yesterday with more CP. He had a dose of xarelto last night.   Objective:    Vital Signs:   Temp:  [97.5 F (36.4 C)-98.6 F (37 C)] 98.4 F (36.9 C) (04/12 0438) Pulse Rate:  [74-91] 90 (04/12 0755) Resp:  [16-19] 18 (04/12 0755) BP: (105-180)/(56-88) 114/61 mmHg (04/12 0755) SpO2:  [92 %-100 %] 95 % (04/12 0755) Weight:  [180 lb (81.647 kg)] 180 lb (81.647 kg) (04/11 0847)      24-hour weight change: Weight change:   Weight trends: Filed Weights   04/01/15 0847  Weight: 180 lb (81.647 kg)    Intake/Output:        Physical Exam: BP 114/61 mmHg  Pulse 90  Temp(Src) 98.4 F (36.9 C) (Oral)  Resp 18  Ht 5\' 6"  (1.676 m)  Wt 180 lb (81.647 kg)  BMI 29.07 kg/m2  SpO2 95%  Wt Readings from Last 3 Encounters:  04/01/15 180 lb (81.647 kg)  03/21/15 188 lb (85.276 kg)  03/05/15 182 lb (82.555 kg)    General: Vital signs reviewed and noted.   Head: Normocephalic, atraumatic.  Eyes: conjunctivae/corneas clear.  EOM's intact.   Throat: normal  Neck:  normal   Lungs:    clear   Heart:  RR.   Abdomen:  Soft, non-tender, non-distended    Extremities: No edema. Pulses are ok   Neurologic: A&O X3, CN II - XII are grossly intact.   Psych: Normal     Labs: BMET:  Recent Labs  04/01/15 0859 04/02/15 0110  NA 141 138  K 4.2 3.9  CL 105 102  CO2 24 26  GLUCOSE 146* 106*  BUN 15 16  CREATININE 1.17 1.18  CALCIUM 9.3 8.8    Liver function tests: No results for input(s): AST, ALT, ALKPHOS, BILITOT, PROT, ALBUMIN in the last 72 hours. No results for input(s): LIPASE, AMYLASE in the last 72 hours.  CBC:  Recent  Labs  04/01/15 0859 04/02/15 0110  WBC 9.4 9.2  HGB 15.0 13.4  HCT 45.0 40.1  MCV 94.1 94.4  PLT 272 252    Cardiac Enzymes:  Recent Labs  04/01/15 1515 04/01/15 1950 04/02/15 0110  TROPONINI 0.05* 0.05* 0.06*    Coagulation Studies: No results for input(s): LABPROT, INR in the last 72 hours.  Other: Invalid input(s): POCBNP No results for input(s): DDIMER in the last 72 hours. No results for input(s): HGBA1C in the last 72 hours. No results for input(s): CHOL, HDL, LDLCALC, TRIG, CHOLHDL in the last 72 hours. No results for input(s): TSH, T4TOTAL, T3FREE, THYROIDAB in the last 72 hours.  Invalid input(s): FREET3 No results for input(s): VITAMINB12, FOLATE, FERRITIN, TIBC, IRON, RETICCTPCT in the last 72 hours.   Other results:  EKG  ( personally reviewed )  -  CT angio of the chest - personally reviewed and reviewed with radiology No PE.  Calcification in the aorta. No evidence of aortic dissection or aneurism    Medications:    Infusions: . heparin 1,150 Units/hr (04/02/15 0657)    Scheduled Medications: . amLODipine  5 mg Oral  QHS  . aspirin EC  81 mg Oral Daily  . metoprolol tartrate  12.5 mg Oral BID  . rosuvastatin  20 mg Oral q1800  . sodium chloride  3 mL Intravenous Q12H  . tamsulosin  0.4 mg Oral QPC supper    Assessment/ Plan:   Active Problems:   Pulmonary embolism  1. Coronary artery disease: The patient has known occlusion of the distal right coronary artery and distal left circumflex proximal artery. His Myoview study in mid March showed an inferior lateral scar. Based on his symptoms, we will proceed with repeat card catheterization. He had a dose of Xarelto last night. We will schedule heart catheterization for tomorrow  2. Pulmonary embolus: Patient's symptoms are consistent with possible pulmonary embolus. He had a mildly elevated d-dimer during his last admission several weeks ago. The VQ scan was intermediate probability. CT and  gram performed last night did not show any pulmonary in was but this could be because of the previous 9 days of Xarelto therapy. If we do not find any specific cardiac etiology for his chest pain, we will continue treatment with the Xarelto.  3. Hyperlipidemia  4. Peripheral vascular disease  Disposition: for cath tomorrow ( since he had xarelto last night)    Length of Stay:   Ramond Dial., MD, [facility] 04/02/2015, 8:22 AM Office [phone] Pager [phone]

## 2015-04-02 NOTE — Progress Notes (Signed)
Pt stated he would not eat breakfast until seen by MD.  Dayton Scrape he should get catherization and does not want to wait.  Stated he was "supposed to have when he was here before but was discharged" without a cath. Stated he was scheduled and prepped for cath on last admission. Pt resting with call bell within reach.  Will continue to monitor. Payton Emerald, RN

## 2015-04-02 NOTE — Progress Notes (Signed)
Patient continues to have sharp intermittent mid chest pain radiating to the back. Pain lasts is precipitated by nothing and lasts for a couple of minute. Pain resolves without the need for medication. Patient advised to call RN when pain returns

## 2015-04-02 NOTE — Progress Notes (Signed)
[place] for Heparin (pt on Xarelto PTA) Indication: CP, r/o ACS  Allergies  Allergen Reactions  . Codeine Nausea And Vomiting         Patient Measurements: Height: 5\' 6"  (167.6 cm) Weight: 180 lb (81.647 kg) IBW/kg (Calculated) : 63.8 Heparin Dosing Weight: 81 kg  Vital Signs: Temp: 97.4 F (36.3 C) (04/12 1320) Temp Source: Oral (04/12 1320) BP: 110/60 mmHg (04/12 1320) Pulse Rate: 82 (04/12 1320)  Labs:  Recent Labs  04/01/15 0859  04/01/15 1950 04/02/15 0110 04/02/15 0835 04/02/15 1251  HGB 15.0  --   --  13.4  --   --   HCT 45.0  --   --  40.1  --   --   PLT 272  --   --  252  --   --   APTT  --   --   --  38*  --  68*  HEPARINUNFRC  --   --   --  >2.20*  --   --   CREATININE 1.17  --   --  1.18  --   --   TROPONINI  --   < > 0.05* 0.06* 0.04*  --   < > = values in this interval not displayed.  Estimated Creatinine Clearance: 55.9 mL/min (by C-G formula based on Cr of 1.18).   Medical History: Past Medical History  Diagnosis Date  . Arthritis   . Myocardial infarction     68    dr Brigitte Pulse PCP          . Hyperlipidemia     takes Crestor daily  . Peripheral vascular disease   . Carotid artery occlusion   . Hypertension     takes Amlodipine daily  . Chronic back pain   . History of gout     has colchicine prn  . History of colon polyps   . Urinary frequency   . Urinary urgency   . Enlarged prostate     takes Rapaflo daily  . History of kidney stones   . Hyperthyroidism     takes SYnthroid daily as instructed  . Cataract     Bil eyes/worse in left eye  . GERD (gastroesophageal reflux disease)     occasional  . Pulmonary emboli 03/20/2015    elevated d-dimer, intermediate V/Q study, atypical chest pain and SOB. Start on Xarelto 20mg  BID for 3 month  . Renal insufficiency    Assessment: 74 y.o. male presents with CP. Noted pt with recent PE - intermediate probability on VQ scan discharged on xarelto.    Patient is currently being rule out for ACS with cath planned for tomorrow to let xarelto received 4/11 washout. He is currently on IV heparin. Anti-Xa levels influenced by recent xarelto so we are dose adjusting based on aptt's. His aptt this afternoon after dose adjustment this morning is now at goal (68s). Will check confirmatory level in 6 hours, increase rate by 50 units/hr to keep within range.  Goal of Therapy:  Heparin level 0.3-0.7 units/ml; aPTT 66-102 sec Monitor platelets by anticoagulation protocol: Yes   Plan:  Increase heparin to 1200 units/hr Recheck aptt tonight then in am  Erin Hearing PharmD., BCPS Clinical Pharmacist Pager [phone] 04/02/2015 2:05 PM

## 2015-04-02 NOTE — Progress Notes (Signed)
Called by RN regarding chest pain: Enzymes cycled overnight and troponin with minimal elevation. ECG pending. We'll check an additional troponin, repeat ECG, and treat symptoms. Keep nothing by mouth for possible cath today.  Rosaria Ferries, PA-C 04/02/2015 8:02 AM Beeper [phone]

## 2015-04-02 NOTE — Progress Notes (Signed)
UR completed 

## 2015-04-03 ENCOUNTER — Other Ambulatory Visit: Payer: Self-pay | Admitting: *Deleted

## 2015-04-03 ENCOUNTER — Encounter (HOSPITAL_COMMUNITY)
Admission: EM | Disposition: A | Discharge status: Home / Self Care | Payer: Self-pay | Source: Home / Self Care | Attending: Thoracic Surgery (Cardiothoracic Vascular Surgery)

## 2015-04-03 ENCOUNTER — Encounter (HOSPITAL_COMMUNITY): Payer: Self-pay | Admitting: Cardiovascular Disease

## 2015-04-03 DIAGNOSIS — I2510 Atherosclerotic heart disease of native coronary artery without angina pectoris: Secondary | ICD-10-CM

## 2015-04-03 DIAGNOSIS — I25110 Atherosclerotic heart disease of native coronary artery with unstable angina pectoris: Secondary | ICD-10-CM | POA: Insufficient documentation

## 2015-04-03 DIAGNOSIS — R079 Chest pain, unspecified: Secondary | ICD-10-CM | POA: Insufficient documentation

## 2015-04-03 HISTORY — PX: LEFT HEART CATHETERIZATION WITH CORONARY ANGIOGRAM: SHX5451

## 2015-04-03 LAB — CBC
HCT: 39.8 % (ref 39.0–52.0)
HEMOGLOBIN: 13.4 g/dL (ref 13.0–17.0)
MCH: 31.2 pg (ref 26.0–34.0)
MCHC: 33.7 g/dL (ref 30.0–36.0)
MCV: 92.8 fL (ref 78.0–100.0)
Platelets: 241 10*3/uL (ref 150–400)
RBC: 4.29 MIL/uL (ref 4.22–5.81)
RDW: 12.9 % (ref 11.5–15.5)
WBC: 10.3 10*3/uL (ref 4.0–10.5)

## 2015-04-03 LAB — APTT: APTT: 91 s — AB (ref 24–37)

## 2015-04-03 SURGERY — LEFT HEART CATHETERIZATION WITH CORONARY ANGIOGRAM
Anesthesia: LOCAL

## 2015-04-03 MED ORDER — HEPARIN (PORCINE) IN NACL 2-0.9 UNIT/ML-% IJ SOLN
INTRAMUSCULAR | Status: AC
  Filled 2015-04-03: qty 1500

## 2015-04-03 MED ORDER — VERAPAMIL HCL 2.5 MG/ML IV SOLN
INTRAVENOUS | Status: AC
  Filled 2015-04-03: qty 2

## 2015-04-03 MED ORDER — HEPARIN (PORCINE) IN NACL 100-0.45 UNIT/ML-% IJ SOLN
1100.0000 [IU]/h | INTRAMUSCULAR | Status: DC
  Administered 2015-04-03: 1200 [IU]/h via INTRAVENOUS
  Filled 2015-04-03 (×2): qty 250

## 2015-04-03 MED ORDER — LIDOCAINE HCL (PF) 1 % IJ SOLN
INTRAMUSCULAR | Status: AC
  Filled 2015-04-03: qty 30

## 2015-04-03 MED ORDER — HEPARIN SODIUM (PORCINE) 1000 UNIT/ML IJ SOLN
INTRAMUSCULAR | Status: AC
  Filled 2015-04-03: qty 1

## 2015-04-03 MED ORDER — MIDAZOLAM HCL 2 MG/2ML IJ SOLN
INTRAMUSCULAR | Status: AC
  Filled 2015-04-03: qty 2

## 2015-04-03 MED ORDER — ACETAMINOPHEN 325 MG PO TABS: 650.0000 mg | ORAL_TABLET | ORAL | Status: DC | PRN

## 2015-04-03 MED ORDER — SODIUM CHLORIDE 0.9 % IV SOLN
INTRAVENOUS | Status: DC
  Administered 2015-04-03: 13:00:00 via INTRAVENOUS

## 2015-04-03 MED ORDER — NITROGLYCERIN IN D5W 200-5 MCG/ML-% IV SOLN
0.0000 ug/min | INTRAVENOUS | Status: DC
  Administered 2015-04-03: 10 ug/min via INTRAVENOUS
  Filled 2015-04-03: qty 250

## 2015-04-03 MED ORDER — ASPIRIN EC 81 MG PO TBEC: 81.0000 mg | DELAYED_RELEASE_TABLET | Freq: Every day | ORAL | Status: DC

## 2015-04-03 MED ORDER — FENTANYL CITRATE 0.05 MG/ML IJ SOLN
INTRAMUSCULAR | Status: AC
  Filled 2015-04-03: qty 2

## 2015-04-03 MED ORDER — NITROGLYCERIN 1 MG/10 ML FOR IR/CATH LAB
INTRA_ARTERIAL | Status: AC
  Filled 2015-04-03: qty 10

## 2015-04-03 MED ORDER — ONDANSETRON HCL 4 MG/2ML IJ SOLN: 4.0000 mg | Freq: Four times a day (QID) | INTRAMUSCULAR | Status: DC | PRN

## 2015-04-03 NOTE — Progress Notes (Signed)
Pt R radial cath site level 0, no active bleeding, hematoma noted; bruising resolved; TR band removed per protocol and new sterile dry dsg applied to site. Will continue to monitor pt quietly. Delia Heady RN

## 2015-04-03 NOTE — H&P (View-Only) (Signed)
PROGRESS NOTE  Subjective:    [name] is a 74 yo with hx of CAD. Recent admission for CP.  + d-dimer.  Intermediate prob. V/Q.  Was thought to have a PE and was discharged on Xarelto. He had a myoview 3/15 that showed an inferiolateral scar which seemed to correspond to his known occluded RCA and distal LCx.  He was admitted yesterday with more CP. He had a dose of xarelto last night.   Objective:    Vital Signs:   Temp:  [97.5 F (36.4 C)-98.6 F (37 C)] 98.4 F (36.9 C) (04/12 0438) Pulse Rate:  [74-91] 90 (04/12 0755) Resp:  [16-19] 18 (04/12 0755) BP: (105-180)/(56-88) 114/61 mmHg (04/12 0755) SpO2:  [92 %-100 %] 95 % (04/12 0755) Weight:  [180 lb (81.647 kg)] 180 lb (81.647 kg) (04/11 0847)      24-hour weight change: Weight change:   Weight trends: Filed Weights   04/01/15 0847  Weight: 180 lb (81.647 kg)    Intake/Output:        Physical Exam: BP 114/61 mmHg  Pulse 90  Temp(Src) 98.4 F (36.9 C) (Oral)  Resp 18  Ht 5\' 6"  (1.676 m)  Wt 180 lb (81.647 kg)  BMI 29.07 kg/m2  SpO2 95%  Wt Readings from Last 3 Encounters:  04/01/15 180 lb (81.647 kg)  03/21/15 188 lb (85.276 kg)  03/05/15 182 lb (82.555 kg)    General: Vital signs reviewed and noted.   Head: Normocephalic, atraumatic.  Eyes: conjunctivae/corneas clear.  EOM's intact.   Throat: normal  Neck:  normal   Lungs:    clear   Heart:  RR.   Abdomen:  Soft, non-tender, non-distended    Extremities: No edema. Pulses are ok   Neurologic: A&O X3, CN II - XII are grossly intact.   Psych: Normal     Labs: BMET:  Recent Labs  04/01/15 0859 04/02/15 0110  NA 141 138  K 4.2 3.9  CL 105 102  CO2 24 26  GLUCOSE 146* 106*  BUN 15 16  CREATININE 1.17 1.18  CALCIUM 9.3 8.8    Liver function tests: No results for input(s): AST, ALT, ALKPHOS, BILITOT, PROT, ALBUMIN in the last 72 hours. No results for input(s): LIPASE, AMYLASE in the last 72 hours.  CBC:  Recent  Labs  04/01/15 0859 04/02/15 0110  WBC 9.4 9.2  HGB 15.0 13.4  HCT 45.0 40.1  MCV 94.1 94.4  PLT 272 252    Cardiac Enzymes:  Recent Labs  04/01/15 1515 04/01/15 1950 04/02/15 0110  TROPONINI 0.05* 0.05* 0.06*    Coagulation Studies: No results for input(s): LABPROT, INR in the last 72 hours.  Other: Invalid input(s): POCBNP No results for input(s): DDIMER in the last 72 hours. No results for input(s): HGBA1C in the last 72 hours. No results for input(s): CHOL, HDL, LDLCALC, TRIG, CHOLHDL in the last 72 hours. No results for input(s): TSH, T4TOTAL, T3FREE, THYROIDAB in the last 72 hours.  Invalid input(s): FREET3 No results for input(s): VITAMINB12, FOLATE, FERRITIN, TIBC, IRON, RETICCTPCT in the last 72 hours.   Other results:  EKG  ( personally reviewed )  -  CT angio of the chest - personally reviewed and reviewed with radiology No PE.  Calcification in the aorta. No evidence of aortic dissection or aneurism    Medications:    Infusions: . heparin 1,150 Units/hr (04/02/15 0657)    Scheduled Medications: . amLODipine  5 mg Oral  QHS  . aspirin EC  81 mg Oral Daily  . metoprolol tartrate  12.5 mg Oral BID  . rosuvastatin  20 mg Oral q1800  . sodium chloride  3 mL Intravenous Q12H  . tamsulosin  0.4 mg Oral QPC supper    Assessment/ Plan:   Active Problems:   Pulmonary embolism  1. Coronary artery disease: The patient has known occlusion of the distal right coronary artery and distal left circumflex proximal artery. His Myoview study in mid March showed an inferior lateral scar. Based on his symptoms, we will proceed with repeat card catheterization. He had a dose of Xarelto last night. We will schedule heart catheterization for tomorrow  2. Pulmonary embolus: Patient's symptoms are consistent with possible pulmonary embolus. He had a mildly elevated d-dimer during his last admission several weeks ago. The VQ scan was intermediate probability. CT and  gram performed last night did not show any pulmonary in was but this could be because of the previous 9 days of Xarelto therapy. If we do not find any specific cardiac etiology for his chest pain, we will continue treatment with the Xarelto.  3. Hyperlipidemia  4. Peripheral vascular disease  Disposition: for cath tomorrow ( since he had xarelto last night)    Length of Stay:   Ramond Dial., MD, [facility] 04/02/2015, 8:22 AM Office [phone] Pager [phone]

## 2015-04-03 NOTE — Progress Notes (Addendum)
   04/03/15 2041  Vitals  BP (!) 144/55 mmHg  MAP (mmHg) 77  BP Location Left Arm  BP Method Automatic  Patient Position (if appropriate) Lying  Pulse Rate 81  Pulse Rate Source Dinamap  Resp 18  Oxygen Therapy  SpO2 97 %  O2 Device Room Air  Pain Assessment  Pain Assessment 0-10  Pain Score 5  Pain Type Acute pain  Pain Location Chest  Pain Orientation Left  Pain Radiating Towards Back  Pain Descriptors / Indicators Throbbing  Pain Frequency Occasional  Pain Onset Gradual  Patients Stated Pain Goal 0  Pain Intervention(s) Medication (See eMAR)  Pt c/o 5/10 chest pain. EKG obtained and vitals obtained. Morphine was given and nitroglycerin was titrated to 22mcg/min. Pt stated relief after receiving morphine.

## 2015-04-03 NOTE — Interval H&P Note (Signed)
Cath Lab Visit (complete for each Cath Lab visit)  Clinical Evaluation Leading to the Procedure:   ACS: Yes.    Non-ACS:    Anginal Classification: CCS IV  Anti-ischemic medical therapy: Maximal Therapy (2 or more classes of medications)  Non-Invasive Test Results: Intermediate-risk stress test findings: cardiac mortality 1-3%/year  Prior CABG: No previous CABG      History and Physical Interval Note:  04/03/2015 8:43 AM  Ryan [name]  has presented today for surgery, with the diagnosis of Chest Pain  The various methods of treatment have been discussed with the patient and family. After consideration of risks, benefits and other options for treatment, the patient has consented to  Procedure(s): LEFT HEART CATHETERIZATION WITH CORONARY ANGIOGRAM (N/A) as a surgical intervention .  The patient's history has been reviewed, patient examined, no change in status, stable for surgery.  I have reviewed the patient's chart and labs.  Questions were answered to the patient's satisfaction.     Ryan [name] A

## 2015-04-03 NOTE — CV Procedure (Signed)
Ryan [name] is a 74 y.o. male   [id number]  [id number] LOCATION:  FACILITY: [facility]  PHYSICIAN: Troy Sine, MD, [facility] Mar 25, 1941   DATE OF PROCEDURE:  04/03/2015     CARDIAC CATHETERIZATION    HISTORY:  Mr. Rayan Ines is a 74 year old who has history of 2 previous myocardial infarctions and was recently diagnosed with pulmonary embolism for which she's been on Xarelto anticoagulation.  A nuclear stress test in early March 2016 showed extensive scarring the inferior and inferolateral walls.  He has been documented have total occlusion of the RCA and circumflex with high-grade diagonal disease.  He recently was readmitted with recurrent chest pain episodes.  His has continued to express recurrent chest pain and was scheduled for cardiac catheterization.  Troponins are mildly positive.   PROCEDURE:  Left heart catheterization via the radial approach: Coronary angiography, left ventriculography.  The patient was brought to the second floor [facility] cath lab in the postabsorptive state. The patient was premedicated with Versed 3 mg and fentanyl 50 mcg. A right radial approach was utilized after an Allen's test verified adequate circulation. The right radial artery was punctured via the Seldinger technique, and a 6 [place] Glidesheath Slender was inserted without difficulty.  A radial cocktail consisting of Verapamil, IV nitroglycerin, and lidocaine was administered. Weight adjusted heparin was administered. A safety J wire was advanced into the ascending aorta. Diagnostic catheterization was done with a 5 French TIG 4.0, JR4, and and 3DRC catheters. A 5 French pigtail catheter was used for left ventriculography. A TR radial band was applied for hemostasis. The patient left the catheterization laboratory in stable condition.   HEMODYNAMICS:   Central Aorta: 110/50  Left Ventricle: 110/3/13  ANGIOGRAPHY:   Fluoroscopy revealed significant coronary calcification.  The  left main coronary artery was angiographically normal and bifurcated into the LAD and left circumflex coronary artery.   The LAD was a moderate size vessel that had significant proximal calcification.  There was smooth 40% to 50% proximal narrowing before the first septal perforating artery.  There was 90% ostial stenosis of the small first diagonal vessel prior to its bifurcation.  The second diagonal vessel was moderate size and had 50% stenosis in its mid segment before the bifurcation.  There was 90% stenosis in the superior branch of the bifurcation.  The LAD had 80% focal stenosis after this first diagonal vessel area after an additional septal perforating artery.  The mid LAD was 95% eccentrically narrowed.  There was 80% more distal LAD stenosis.  There were significant septal collateralization and apical LAD collateralization to the distal RCA.  The left circumflex coronary artery gave rise to a high marginal branch, which was normal.  There was diffuse narrowing of 70-80% in the AV groove circumflex after this marginal branch.  The vessel supplied an additional marginal vessel.  The AV groove circumflex after the second marginal takeoff was occluded.  There was collateralization to the distal circumflex.   The RCA was totally occluded proximally.  There was faint filling antegrade.  There were significant collaterals from the left coronary system for anomaly from several septal perforating arteries of the apical LAD portion.  Left ventriculography revealed normal global LV contractility with mid cavity significant contractility.  There is LVH.  Ejection fraction is 60%.  There was moderate focal hypokinesis in the basal inferior wall.    IMPRESSION:  Preserved global LV function with near mid cavity obliteration with moderate left ventricular hypertrophy and focal posterior  hypocontractility with a global ejection fraction of 60%.  Significant coronary calcification with three-vessel  coronary obstructive disease with 40-50% smooth proximal LAD stenosis, 90% ostial first diagonal stenosis of a small diagonal vessel, 50% mid second diagonal stenosis with 90% stenosis in a superior branch, and segmental 80%, 95%, and 80% LAD stenoses in the mid and mid-distal LAD; 70-80% AV groove circumflex stenosis after the OM1 takeoff with total occlusion of the AV groove circumflex after the own 2 vessel with collateralization; and total proximal RCA occlusion with faint antegrade collaterals and extensive left-to-right collaterals.   RECOMMENDATION:  The patient is status post recent pulmonary embolism and has been on oral anticoagulation therapy with Xarelto.  His RCA occlusion and distal circumflex occlusions are old.  He has developed significant progressive disease in the LAD system with significant coronary calcification.  Surgical consultation will initially be obtained for consideration of CABG revascularization surgery.   Troy Sine, MD, [facility] 04/03/2015 10:11 AM

## 2015-04-03 NOTE — Progress Notes (Signed)
Pt arrived back from cath lab, VSS, telemetry reapplied; right radial TR band has 11cc air per report; site level 1 d/t bruising noted to site; no active bleeding or hematoma noted. Pt educated on keeping hand still and not bending or twisting hand/wrist; pt voices understanding; arm elevated on pillow. Will continue to monitor pt quietly per protocol; IV intact and infusing. Pt in bed with call light within reach. Francis Gaines Kden Wagster RN.

## 2015-04-03 NOTE — Consult Note (Signed)
Reason for Consult:3 vessel CAD with unstable coronary syndrome Referring Physician: Dr. Joen Laura Ryan [name] is an 74 y.o. male.  HPI: 74 yo man with a history of CAD who presents with cc/o chest pain.  [name] is a 74 yo man with a past medical history significant for coronary artery disease, two prior MI, PAD, carotid artery occlusion, and a possible recent PE. He has been having CP and SOB for the past 5-6 weeks. He had a nuclear stress test in March which showed extensive scar in the inferior wall, mild LV dysfunction, but no reversible ischemia.  About 2 weeks ago he was admitted with CP and SOB. His cardiac enzymes were negative. A VQ scan showed intermediate probability of PE. He was started on Xarelto. Post discharge he continued to have pain.   On the day of readmission he had a severe episode of pain hat woke him up at 5 AM. It was a sharp pain radiating from the right anterior chest to below his right scapula. He did naot have nausea or diaphoresis. His ECG had non specific ST-T wave changes. His initial troponin was negative but subsequently he had a troponin of 0.06.  Today he had cardiac catheterization which revealed chronic occlusion of the RCA and the circumflex and progression of disease in the LAD.  Lower extremity duplex showed no evidence of DVT. A CT angiogram showed no evidence of PE.  Past Medical History  Diagnosis Date  . Arthritis   . Myocardial infarction     12    dr Brigitte Pulse PCP          . Hyperlipidemia     takes Crestor daily  . Peripheral vascular disease   . Carotid artery occlusion   . Hypertension     takes Amlodipine daily  . Chronic back pain   . History of gout     has colchicine prn  . History of colon polyps   . Urinary frequency   . Urinary urgency   . Enlarged prostate     takes Rapaflo daily  . History of kidney stones   . Hyperthyroidism     takes SYnthroid daily as instructed  . Cataract     Bil eyes/worse in left eye  . GERD  (gastroesophageal reflux disease)     occasional  . Pulmonary emboli 03/20/2015    elevated d-dimer, intermediate V/Q study, atypical chest pain and SOB. Start on Xarelto $RemoveBe'20mg'DsNGBnudj$  BID for 3 month  . Renal insufficiency     Past Surgical History  Procedure Laterality Date  . Femoral artery - popliteal artery bypass graft    . Joint replacement      shoulder  . Back surgery      5 times  . Lumbar laminectomy/decompression microdiscectomy  02/12/2012    Procedure: LUMBAR LAMINECTOMY/DECOMPRESSION MICRODISCECTOMY;  Surgeon: Floyce Stakes, MD;  Location: [facility];  Service: Neurosurgery;  Laterality: N/A;  Lumbar four-five laminectomy  . Appendectomy    . Cataract surgery      left eye  . Colonoscopy    . Big toe surgery    . Cystoscopy    . Lumbar laminectomy  01/06/2013    Procedure: MICRODISCECTOMY LUMBAR LAMINECTOMY;  Surgeon: Marybelle Killings, MD;  Location: [facility];  Service: Orthopedics;  Laterality: N/A;  L3-4 decompression  . Cardiac catheterization      2010    dr Acie Fredrickson  . Eye surgery    . Total hip arthroplasty Left  01/09/2014    DR Ninfa Linden  . Total hip arthroplasty Left 01/09/2014    Procedure: LEFT TOTAL HIP ARTHROPLASTY ANTERIOR APPROACH and Steroid Injection Right hip;  Surgeon: Mcarthur Rossetti, MD;  Location: [place] [place];  Service: Orthopedics;  Laterality: Left;  . Steriod injection Right 01/09/2014    Procedure: STEROID INJECTION;  Surgeon: Mcarthur Rossetti, MD;  Location: [place];  Service: Orthopedics;  Laterality: Right;    Family History  Problem Relation Age of Onset  . Heart disease Father   . Heart attack Father   . Heart disease Sister   . Hypertension Mother   . Diabetes Son     Social History:  reports that he quit smoking about 28 years ago. His smoking use included Cigarettes. He has quit using smokeless tobacco. His smokeless tobacco use included Chew. He reports that he does not drink alcohol or use illicit drugs.  Allergies:  Allergies   Allergen Reactions  . Codeine Nausea And Vomiting         Medications:  Scheduled: . amLODipine  5 mg Oral QHS  . aspirin EC  81 mg Oral Daily  . metoprolol tartrate  12.5 mg Oral BID  . rosuvastatin  20 mg Oral q1800  . sodium chloride  3 mL Intravenous Q12H  . tamsulosin  0.4 mg Oral QPC supper    Results for orders placed or performed during the hospital encounter of 04/01/15 (from the past 48 hour(s))  Troponin I-(serum)     Status: Abnormal   Collection Time: 04/02/15  1:10 AM  Result Value Ref Range   Troponin I 0.06 (H) <0.031 ng/mL    Comment:        PERSISTENTLY INCREASED TROPONIN VALUES IN THE RANGE OF 0.04-0.49 ng/mL CAN BE SEEN IN:       -UNSTABLE ANGINA       -CONGESTIVE HEART FAILURE       -MYOCARDITIS       -CHEST TRAUMA       -ARRYHTHMIAS       -LATE PRESENTING MYOCARDIAL INFARCTION       -COPD   CLINICAL FOLLOW-UP RECOMMENDED.   Basic metabolic panel     Status: Abnormal   Collection Time: 04/02/15  1:10 AM  Result Value Ref Range   Sodium 138 135 - 145 mmol/L   Potassium 3.9 3.5 - 5.1 mmol/L   Chloride 102 96 - 112 mmol/L   CO2 26 19 - 32 mmol/L   Glucose, Bld 106 (H) 70 - 99 mg/dL   BUN 16 6 - 23 mg/dL   Creatinine, Ser 1.18 0.50 - 1.35 mg/dL   Calcium 8.8 8.4 - 10.5 mg/dL   GFR calc non Af Amer 59 (L) >90 mL/min   GFR calc Af Amer 69 (L) >90 mL/min    Comment: (NOTE) The eGFR has been calculated using the CKD EPI equation. This calculation has not been validated in all clinical situations. eGFR's persistently <90 mL/min signify possible Chronic Kidney Disease.    Anion gap 10 5 - 15  APTT     Status: Abnormal   Collection Time: 04/02/15  1:10 AM  Result Value Ref Range   aPTT 38 (H) 24 - 37 seconds    Comment:        IF BASELINE aPTT IS ELEVATED, SUGGEST PATIENT RISK ASSESSMENT BE USED TO DETERMINE APPROPRIATE ANTICOAGULANT THERAPY.   Heparin level (unfractionated)     Status: Abnormal   Collection Time: 04/02/15  1:10 AM  Result  Value Ref Range   Heparin Unfractionated >2.20 (H) 0.30 - 0.70 IU/mL    Comment: RESULTS CONFIRMED BY MANUAL DILUTION        IF HEPARIN RESULTS ARE BELOW EXPECTED VALUES, AND PATIENT DOSAGE HAS BEEN CONFIRMED, SUGGEST FOLLOW UP TESTING OF ANTITHROMBIN III LEVELS.   CBC     Status: None   Collection Time: 04/02/15  1:10 AM  Result Value Ref Range   WBC 9.2 4.0 - 10.5 K/uL   RBC 4.25 4.22 - 5.81 MIL/uL   Hemoglobin 13.4 13.0 - 17.0 g/dL   HCT 40.1 39.0 - 52.0 %   MCV 94.4 78.0 - 100.0 fL   MCH 31.5 26.0 - 34.0 pg   MCHC 33.4 30.0 - 36.0 g/dL   RDW 12.9 11.5 - 15.5 %   Platelets 252 150 - 400 K/uL  Troponin I     Status: Abnormal   Collection Time: 04/02/15  8:35 AM  Result Value Ref Range   Troponin I 0.04 (H) <0.031 ng/mL    Comment:        PERSISTENTLY INCREASED TROPONIN VALUES IN THE RANGE OF 0.04-0.49 ng/mL CAN BE SEEN IN:       -UNSTABLE ANGINA       -CONGESTIVE HEART FAILURE       -MYOCARDITIS       -CHEST TRAUMA       -ARRYHTHMIAS       -LATE PRESENTING MYOCARDIAL INFARCTION       -COPD   CLINICAL FOLLOW-UP RECOMMENDED.   APTT     Status: Abnormal   Collection Time: 04/02/15 12:51 PM  Result Value Ref Range   aPTT 68 (H) 24 - 37 seconds    Comment:        IF BASELINE aPTT IS ELEVATED, SUGGEST PATIENT RISK ASSESSMENT BE USED TO DETERMINE APPROPRIATE ANTICOAGULANT THERAPY.   APTT     Status: Abnormal   Collection Time: 04/02/15  9:58 PM  Result Value Ref Range   aPTT 78 (H) 24 - 37 seconds    Comment:        IF BASELINE aPTT IS ELEVATED, SUGGEST PATIENT RISK ASSESSMENT BE USED TO DETERMINE APPROPRIATE ANTICOAGULANT THERAPY.   CBC     Status: None   Collection Time: 04/03/15  3:20 AM  Result Value Ref Range   WBC 10.3 4.0 - 10.5 K/uL   RBC 4.29 4.22 - 5.81 MIL/uL   Hemoglobin 13.4 13.0 - 17.0 g/dL   HCT 39.8 39.0 - 52.0 %   MCV 92.8 78.0 - 100.0 fL   MCH 31.2 26.0 - 34.0 pg   MCHC 33.7 30.0 - 36.0 g/dL   RDW 12.9 11.5 - 15.5 %   Platelets 241  150 - 400 K/uL  APTT     Status: Abnormal   Collection Time: 04/03/15  3:20 AM  Result Value Ref Range   aPTT 91 (H) 24 - 37 seconds    Comment:        IF BASELINE aPTT IS ELEVATED, SUGGEST PATIENT RISK ASSESSMENT BE USED TO DETERMINE APPROPRIATE ANTICOAGULANT THERAPY.     No results found.  Review of Systems  Constitutional: Positive for malaise/fatigue. Negative for fever and chills.  Respiratory: Positive for shortness of breath.   Cardiovascular: Positive for chest pain.  Musculoskeletal: Positive for myalgias and joint pain.  All other systems reviewed and are negative.  Blood pressure 144/55, pulse 81, temperature 97.8 F (36.6 C), temperature source Oral, resp. rate 18, height $RemoveBe'5\' 6"'UilaghIrj$  (1.676 m),  weight 177 lb 8 oz (80.513 kg), SpO2 97 %. Physical Exam  Vitals reviewed. Constitutional: He is oriented to person, place, and time. He appears well-developed and well-nourished. No distress.  HENT:  Head: Normocephalic and atraumatic.  Eyes: EOM are normal. Pupils are equal, round, and reactive to light.  Neck: Neck supple. No thyromegaly present.  Right carotid bruit  Cardiovascular: Normal rate, regular rhythm, normal heart sounds and intact distal pulses.   No murmur heard. Respiratory: Effort normal and breath sounds normal. He has no wheezes. He has no rales.  GI: Soft. There is no tenderness.  Well healed midline scar  Musculoskeletal: He exhibits no edema.  Neurological: He is alert and oriented to person, place, and time. No cranial nerve deficit.  No focal motor deficit     CT ANGIOGRAPHY CHEST WITH CONTRAST  TECHNIQUE: Multidetector CT imaging of the chest was performed using the standard protocol during bolus administration of intravenous contrast. Multiplanar CT image reconstructions and MIPs were obtained to evaluate the vascular anatomy.  CONTRAST: 67m OMNIPAQUE IOHEXOL 350 MG/ML SOLN  COMPARISON: Radiographs of April 01, 2015.  FINDINGS: No  pneumothorax or pleural effusion is noted. No acute pulmonary disease is noted. There is no evidence of pulmonary embolus. There is no evidence of thoracic aortic dissection or aneurysm. Great vessels are widely patent. Coronary artery calcifications are noted. Visualized portion of upper abdomen appears normal. Status post left shoulder arthroplasty.  Review of the MIP images confirms the above findings.  IMPRESSION: No evidence of pulmonary embolus. No acute abnormality seen within the chest.  CARDIAC CATHETERIZATION IMPRESSION:  Preserved global LV function with near mid cavity obliteration with moderate left ventricular hypertrophy and focal posterior hypocontractility with a global ejection fraction of 60%.  Significant coronary calcification with three-vessel coronary obstructive disease with 40-50% smooth proximal LAD stenosis, 90% ostial first diagonal stenosis of a small diagonal vessel, 50% mid second diagonal stenosis with 90% stenosis in a superior branch, and segmental 80%, 95%, and 80% LAD stenoses in the mid and mid-distal LAD; 70-80% AV groove circumflex stenosis after the OM1 takeoff with total occlusion of the AV groove circumflex after the own 2 vessel with collateralization; and total proximal RCA occlusion with faint antegrade collaterals and extensive left-to-right collaterals.  Assessment/Plan: 74yo man with multiple cardiac risk factors and a history of CAD who presents with a 5-6 week history of unstable progressive chest pain. He has severe 3 vessel CAD. CABG is indicated for survival benefit and relief of symptoms.  I suspect his CP has been cardiac in origin all along. He has no evidence of DVT and his CT angio did not show a OE. His V/Q was only intermediate probability. It is possible he had a small PE that resolved with anticoagulation. In any event, I do not think the questionable diagnosis of a PE should delay his CABG.  I discussed the general nature of  the procedure, the need for general anesthesia, the use of cardiopulmonary bypass, and incisions to be used with Mr and Mrs KModesto We discussed the expected hospital stay, overall recovery and short and long term outcomes. I reviewed the indications, risks benefits and alternatives. They understand the risks include, but are not limited to death, stroke, MI, DVT/PE, bleeding, possible need for transfusion, infections, cardiac arrhythmias, and other organ system dysfunction including respiratory, renal, or GI complications. He accepts the risks and agrees to proceed.  Plan CABG Friday 04/05/15  SMelrose Nakayama4/13/2016, 10:06 PM

## 2015-04-03 NOTE — Progress Notes (Signed)
ANTICOAGULATION CONSULT NOTE - Follow Up Consult  Pharmacy Consult for heparin (on Xarelto PTA) Indication: 3V CAD, awaiting TCTS consult  Allergies  Allergen Reactions  . Codeine Nausea And Vomiting         Patient Measurements: Height: 5\' 6"  (167.6 cm) Weight: 177 lb 8 oz (80.513 kg) IBW/kg (Calculated) : 63.8 Heparin Dosing Weight: 81 kg  Vital Signs: Temp: 97.5 F (36.4 C) (04/13 1025) Temp Source: Oral (04/13 1025) BP: 122/52 mmHg (04/13 1055) Pulse Rate: 67 (04/13 1055)  Labs:  Recent Labs  04/01/15 0859  04/01/15 1950  04/02/15 0110 04/02/15 0835 04/02/15 1251 04/02/15 2158 04/03/15 0320  HGB 15.0  --   --   --  13.4  --   --   --  13.4  HCT 45.0  --   --   --  40.1  --   --   --  39.8  PLT 272  --   --   --  252  --   --   --  241  APTT  --   --   --   < > 38*  --  68* 78* 91*  HEPARINUNFRC  --   --   --   --  >2.20*  --   --   --   --   CREATININE 1.17  --   --   --  1.18  --   --   --   --   TROPONINI  --   < > 0.05*  --  0.06* 0.04*  --   --   --   < > = values in this interval not displayed.  Estimated Creatinine Clearance: 55.6 mL/min (by C-G formula based on Cr of 1.18).   Assessment: 74 y/o male who presented with CP, also with recent PE and discharged on Xarelto. Last dose of Xarelto was 4/10 pm at home.  He is s/p cath with 3V CAD awaiting TCTS consult. Pharmacy consulted to resume heparin 8 hrs post sheath removal. Sheath removed and TR band placed at 09:50. Xarelto may still be affecting heparin levels so will check both aPTT and heparin level - once correlating will transition to heparin levels only. No bleeding noted, CBC was stable this morning.  Goal of Therapy:  Heparin level 0.3-0.7 units/ml aPTT 66-102 seconds Monitor platelets by anticoagulation protocol: Yes   Plan:  - Resume heparin drip at 1200 units/hr with no bolus at 18:00 - 8 hr heparin level and aPTT - Daily heparin level and CBC - Monitor for s/sx of bleeding  [facility], [place].D., BCPS Clinical Pharmacist Pager: [phone] 04/03/2015 11:19 AM

## 2015-04-03 NOTE — Progress Notes (Signed)
PROGRESS NOTE  Subjective:    [name] is a 74 yo with hx of CAD. Recent admission for CP.  + d-dimer.  Intermediate prob. V/Q.  Was thought to have a PE and was discharged on Xarelto. He had a myoview 3/15 that showed an inferiolateral scar which seemed to correspond to his known occluded RCA and distal LCx.  He was admitted Monday  with more CP. Cath this am shows severe 3 V cad.  TCTS has been consulted.   Objective:    Vital Signs:   Temp:  [97.4 F (36.3 C)-97.8 F (36.6 C)] 97.5 F (36.4 C) (04/13 1025) Pulse Rate:  [66-84] 67 (04/13 1055) Resp:  [18] 18 (04/13 1055) BP: (110-126)/(52-64) 122/52 mmHg (04/13 1055) SpO2:  [94 %-96 %] 95 % (04/13 1055) Weight:  [177 lb 8 oz (80.513 kg)] 177 lb 8 oz (80.513 kg) (04/13 0354)  Last BM Date: 04/02/15   24-hour weight change: Weight change: -2 lb 8 oz (-1.134 kg)  Weight trends: Filed Weights   04/01/15 0847 04/03/15 0354  Weight: 180 lb (81.647 kg) 177 lb 8 oz (80.513 kg)    Intake/Output:  04/12 0701 - 04/13 0700 In: 480 [P.O.:480] Out: 620 [Urine:620] Total I/O In: -  Out: 300 [Urine:300]   Physical Exam: BP 122/52 mmHg  Pulse 67  Temp(Src) 97.5 F (36.4 C) (Oral)  Resp 18  Ht 5\' 6"  (1.676 m)  Wt 177 lb 8 oz (80.513 kg)  BMI 28.66 kg/m2  SpO2 95%  Wt Readings from Last 3 Encounters:  04/03/15 177 lb 8 oz (80.513 kg)  03/21/15 188 lb (85.276 kg)  03/05/15 182 lb (82.555 kg)    General: Vital signs reviewed and noted.   Head: Normocephalic, atraumatic.  Eyes: conjunctivae/corneas clear.  EOM's intact.   Throat: normal  Neck:  normal   Lungs:    clear   Heart:  RR.   Abdomen:  Soft, non-tender, non-distended    Extremities: No edema. Pulses are ok . Right TR band is in place   Neurologic: A&O X3, CN II - XII are grossly intact.   Psych: Normal     Labs: BMET:  Recent Labs  04/01/15 0859 04/02/15 0110  NA 141 138  K 4.2 3.9  CL 105 102  CO2 24 26  GLUCOSE 146* 106*  BUN  15 16  CREATININE 1.17 1.18  CALCIUM 9.3 8.8    Liver function tests: No results for input(s): AST, ALT, ALKPHOS, BILITOT, PROT, ALBUMIN in the last 72 hours. No results for input(s): LIPASE, AMYLASE in the last 72 hours.  CBC:  Recent Labs  04/02/15 0110 04/03/15 0320  WBC 9.2 10.3  HGB 13.4 13.4  HCT 40.1 39.8  MCV 94.4 92.8  PLT 252 241    Cardiac Enzymes:  Recent Labs  04/01/15 1515 04/01/15 1950 04/02/15 0110 04/02/15 0835  TROPONINI 0.05* 0.05* 0.06* 0.04*    Coagulation Studies: No results for input(s): LABPROT, INR in the last 72 hours.  Other: Invalid input(s): POCBNP No results for input(s): DDIMER in the last 72 hours. No results for input(s): HGBA1C in the last 72 hours. No results for input(s): CHOL, HDL, LDLCALC, TRIG, CHOLHDL in the last 72 hours. No results for input(s): TSH, T4TOTAL, T3FREE, THYROIDAB in the last 72 hours.  Invalid input(s): FREET3 No results for input(s): VITAMINB12, FOLATE, FERRITIN, TIBC, IRON, RETICCTPCT in the last 72 hours.   Other results:  EKG  ( personally reviewed )  -  CT angio of the chest - personally reviewed and reviewed with radiology No PE.  Calcification in the aorta. No evidence of aortic dissection or aneurism    Medications:    Infusions: . sodium chloride    . nitroGLYCERIN      Scheduled Medications: . amLODipine  5 mg Oral QHS  . aspirin EC  81 mg Oral Daily  . metoprolol tartrate  12.5 mg Oral BID  . rosuvastatin  20 mg Oral q1800  . sodium chloride  3 mL Intravenous Q12H  . tamsulosin  0.4 mg Oral QPC supper    Assessment/ Plan:   Active Problems:   Pulmonary embolism   Pain in the chest   Coronary artery disease due to calcified coronary lesion  1. Coronary artery disease: The patient has been found to have significant 3 V cad. TCTS has been conusulted    2. Pulmonary embolus: Patient's symptoms are consistent with possible pulmonary embolus. He had a mildly elevated d-dimer  during his last admission several weeks ago. The VQ scan was intermediate probability. CT and gram performed last night did not show any pulmonary in was but this could be because of the previous 9 days of Xarelto therapy. If we do not find any specific cardiac etiology for his chest pain, we will continue treatment with the Xarelto.  3. Hyperlipidemia  4. Peripheral vascular disease  Disposition:    Length of Stay: 1  Thayer Headings, Brooke Bonito., MD, [facility] 04/03/2015, 11:01 AM Office [phone] Pager [phone]

## 2015-04-03 NOTE — Progress Notes (Signed)
2cc of air was removed but site started oozing slightly, 2cc air added back and will continue to monitor quietly. Francis Gaines Liban Guedes RN.

## 2015-04-03 NOTE — Progress Notes (Signed)
Pt transported off unit to cath lab for procedure. Pt heparin drip stopped per protocol. Francis Gaines Kaydie Petsch RN.

## 2015-04-04 ENCOUNTER — Inpatient Hospital Stay (HOSPITAL_COMMUNITY): Payer: Medicare Other

## 2015-04-04 DIAGNOSIS — I2584 Coronary atherosclerosis due to calcified coronary lesion: Secondary | ICD-10-CM

## 2015-04-04 DIAGNOSIS — R079 Chest pain, unspecified: Secondary | ICD-10-CM

## 2015-04-04 LAB — HEPARIN LEVEL (UNFRACTIONATED)
Heparin Unfractionated: 0.37 IU/mL (ref 0.30–0.70)
Heparin Unfractionated: 0.73 IU/mL — ABNORMAL HIGH (ref 0.30–0.70)
Heparin Unfractionated: 0.53 IU/mL (ref 0.30–0.70)

## 2015-04-04 LAB — PULMONARY FUNCTION TEST
DL/VA % pred: 95 %
DL/VA: 3.99 ml/min/mmHg/L
DLCO cor % pred: 75 %
DLCO cor: 18.39 ml/min/mmHg
DLCO unc % pred: 70 %
DLCO unc: 17.08 ml/min/mmHg
FEF 25-75 Post: 2.77 L/sec
FEF 25-75 Pre: 2.25 L/sec
FEF2575-%Change-Post: 23 %
FEF2575-%Pred-Post: 158 %
FEF2575-%Pred-Pre: 128 %
FEV1-%CHANGE-POST: 4 %
FEV1-%PRED-PRE: 106 %
FEV1-%Pred-Post: 110 %
FEV1-Post: 2.64 L
FEV1-Pre: 2.53 L
FEV1FVC-%CHANGE-POST: 4 %
FEV1FVC-%PRED-PRE: 107 %
FEV6-%Change-Post: 0 %
FEV6-%PRED-POST: 104 %
FEV6-%Pred-Pre: 104 %
FEV6-Post: 3.23 L
FEV6-Pre: 3.22 L
FEV6FVC-%Change-Post: 0 %
FEV6FVC-%Pred-Post: 107 %
FEV6FVC-%Pred-Pre: 107 %
FVC-%Change-Post: 0 %
FVC-%Pred-Post: 97 %
FVC-%Pred-Pre: 97 %
FVC-Post: 3.24 L
FVC-Pre: 3.23 L
POST FEV1/FVC RATIO: 82 %
POST FEV6/FVC RATIO: 100 %
Pre FEV1/FVC ratio: 78 %
Pre FEV6/FVC Ratio: 100 %
RV % pred: 88 %
RV: 1.94 L
TLC % PRED: 92 %
TLC: 5.38 L

## 2015-04-04 LAB — URINALYSIS, ROUTINE W REFLEX MICROSCOPIC
Bilirubin Urine: NEGATIVE
GLUCOSE, UA: NEGATIVE mg/dL
Hgb urine dipstick: NEGATIVE
Ketones, ur: NEGATIVE mg/dL
LEUKOCYTES UA: NEGATIVE
Nitrite: NEGATIVE
PROTEIN: NEGATIVE mg/dL
Specific Gravity, Urine: 1.018 (ref 1.005–1.030)
Urobilinogen, UA: 1 mg/dL (ref 0.0–1.0)
pH: 5.5 (ref 5.0–8.0)

## 2015-04-04 LAB — CBC
HCT: 37 % — ABNORMAL LOW (ref 39.0–52.0)
Hemoglobin: 12.3 g/dL — ABNORMAL LOW (ref 13.0–17.0)
MCH: 31.2 pg (ref 26.0–34.0)
MCHC: 33.2 g/dL (ref 30.0–36.0)
MCV: 93.9 fL (ref 78.0–100.0)
PLATELETS: 234 10*3/uL (ref 150–400)
RBC: 3.94 MIL/uL — ABNORMAL LOW (ref 4.22–5.81)
RDW: 12.7 % (ref 11.5–15.5)
WBC: 9 10*3/uL (ref 4.0–10.5)

## 2015-04-04 LAB — APTT
aPTT: 56 seconds — ABNORMAL HIGH (ref 24–37)
aPTT: 68 seconds — ABNORMAL HIGH (ref 24–37)

## 2015-04-04 MED ORDER — VANCOMYCIN HCL 10 G IV SOLR
1500.0000 mg | INTRAVENOUS | Status: AC
  Administered 2015-04-05: 1500 mg via INTRAVENOUS
  Filled 2015-04-04: qty 1500

## 2015-04-04 MED ORDER — ALPRAZOLAM 0.25 MG PO TABS
0.2500 mg | ORAL_TABLET | Freq: Three times a day (TID) | ORAL | Status: DC | PRN
  Filled 2015-04-04: qty 1

## 2015-04-04 MED ORDER — ALPRAZOLAM 0.25 MG PO TABS
0.2500 mg | ORAL_TABLET | ORAL | Status: DC | PRN
  Administered 2015-04-04: 0.25 mg via ORAL
  Filled 2015-04-04: qty 2

## 2015-04-04 MED ORDER — SODIUM CHLORIDE 0.9 % IV SOLN
INTRAVENOUS | Status: AC
  Administered 2015-04-05: 3 [IU]/h via INTRAVENOUS
  Filled 2015-04-04: qty 2.5

## 2015-04-04 MED ORDER — DOPAMINE-DEXTROSE 3.2-5 MG/ML-% IV SOLN
0.0000 ug/kg/min | INTRAVENOUS | Status: AC
  Administered 2015-04-05: 3 ug/kg/min via INTRAVENOUS
  Filled 2015-04-04: qty 250

## 2015-04-04 MED ORDER — CHLORHEXIDINE GLUCONATE CLOTH 2 % EX PADS
6.0000 | MEDICATED_PAD | Freq: Once | CUTANEOUS | Status: AC
  Administered 2015-04-04: 6 via TOPICAL

## 2015-04-04 MED ORDER — HEPARIN SODIUM (PORCINE) 1000 UNIT/ML IJ SOLN
INTRAMUSCULAR | Status: DC
  Filled 2015-04-04: qty 30

## 2015-04-04 MED ORDER — CHLORHEXIDINE GLUCONATE CLOTH 2 % EX PADS
6.0000 | MEDICATED_PAD | Freq: Once | CUTANEOUS | Status: AC
  Administered 2015-04-05: 6 via TOPICAL

## 2015-04-04 MED ORDER — DEXTROSE 5 % IV SOLN
750.0000 mg | INTRAVENOUS | Status: DC
  Filled 2015-04-04: qty 750

## 2015-04-04 MED ORDER — ALBUTEROL SULFATE (2.5 MG/3ML) 0.083% IN NEBU
2.5000 mg | INHALATION_SOLUTION | Freq: Once | RESPIRATORY_TRACT | Status: AC
  Administered 2015-04-04: 2.5 mg via RESPIRATORY_TRACT

## 2015-04-04 MED ORDER — POTASSIUM CHLORIDE 2 MEQ/ML IV SOLN
80.0000 meq | INTRAVENOUS | Status: DC
  Filled 2015-04-04: qty 40

## 2015-04-04 MED ORDER — DEXTROSE 5 % IV SOLN
1.5000 g | INTRAVENOUS | Status: AC
  Administered 2015-04-05: 1500 mg via INTRAVENOUS
  Administered 2015-04-05: 750 mg via INTRAVENOUS
  Filled 2015-04-04: qty 1.5

## 2015-04-04 MED ORDER — PHENYLEPHRINE HCL 10 MG/ML IJ SOLN
30.0000 ug/min | INTRAMUSCULAR | Status: DC
  Filled 2015-04-04: qty 2

## 2015-04-04 MED ORDER — PLASMA-LYTE 148 IV SOLN
INTRAVENOUS | Status: AC
  Administered 2015-04-05: 500 mL
  Filled 2015-04-04: qty 2.5

## 2015-04-04 MED ORDER — NITROGLYCERIN IN D5W 200-5 MCG/ML-% IV SOLN
2.0000 ug/min | INTRAVENOUS | Status: DC
  Filled 2015-04-04: qty 250

## 2015-04-04 MED ORDER — DEXMEDETOMIDINE HCL IN NACL 400 MCG/100ML IV SOLN
0.1000 ug/kg/h | INTRAVENOUS | Status: AC
  Administered 2015-04-05: 0.3 ug/kg/h via INTRAVENOUS
  Filled 2015-04-04: qty 100

## 2015-04-04 MED ORDER — DEXTROSE 5 % IV SOLN
0.0000 ug/min | INTRAVENOUS | Status: DC
  Filled 2015-04-04: qty 4

## 2015-04-04 MED ORDER — SODIUM CHLORIDE 0.9 % IV SOLN
INTRAVENOUS | Status: AC
  Administered 2015-04-05: 69.8 mL/h via INTRAVENOUS
  Filled 2015-04-04: qty 40

## 2015-04-04 MED ORDER — MAGNESIUM SULFATE 50 % IJ SOLN
40.0000 meq | INTRAMUSCULAR | Status: DC
Start: 2015-04-05 — End: 2015-04-05
  Filled 2015-04-04: qty 10

## 2015-04-04 NOTE — Progress Notes (Signed)
ANTICOAGULATION CONSULT NOTE - Follow Up Consult  Pharmacy Consult for heparin Indication: PE (on Xarelto PTA) and CAD awaiting CABG  Labs:  Recent Labs  04/01/15 0859  04/01/15 1950 04/02/15 0110 04/02/15 0835  04/02/15 2158 04/03/15 0320 04/04/15 0233  HGB 15.0  --   --  13.4  --   --   --  13.4 12.3*  HCT 45.0  --   --  40.1  --   --   --  39.8 37.0*  PLT 272  --   --  252  --   --   --  241 234  APTT  --   --   --  38*  --   < > 78* 91* 56*  HEPARINUNFRC  --   --   --  >2.20*  --   --   --   --  0.37  CREATININE 1.17  --   --  1.18  --   --   --   --   --   TROPONINI  --   < > 0.05* 0.06* 0.04*  --   --   --   --   < > = values in this interval not displayed.   Assessment/Plan:  74yo male subtherapeutic on heparin using PTT though gtt was restarted late and lab drawn early, likely needs more time to accumulate. Will continue gtt at current rate and check another PTT.   Wynona Neat, PharmD, BCPS  04/04/2015,3:33 AM

## 2015-04-04 NOTE — Progress Notes (Signed)
ANTICOAGULATION CONSULT NOTE - Follow Up Consult  Pharmacy Consult for heparin (on Xarelto PTA) Indication: 3V CAD, awaiting TCTS consult  Allergies  Allergen Reactions  . Codeine Nausea And Vomiting         Patient Measurements: Height: 5\' 6"  (167.6 cm) Weight: 177 lb 8 oz (80.513 kg) IBW/kg (Calculated) : 63.8 Heparin Dosing Weight: 81 kg  Vital Signs: Temp: 98 F (36.7 C) (04/14 1447) Temp Source: Oral (04/14 1447) BP: 125/58 mmHg (04/14 1447) Pulse Rate: 79 (04/14 1447)  Labs:  Recent Labs  04/02/15 0110 04/02/15 0835  04/03/15 0320 04/04/15 0233 04/04/15 0753 04/04/15 1835  HGB 13.4  --   --  13.4 12.3*  --   --   HCT 40.1  --   --  39.8 37.0*  --   --   PLT 252  --   --  241 234  --   --   APTT 38*  --   < > 91* 56* 68*  --   HEPARINUNFRC >2.20*  --   --   --  0.37 0.73* 0.53  CREATININE 1.18  --   --   --   --   --   --   TROPONINI 0.06* 0.04*  --   --   --   --   --   < > = values in this interval not displayed.  Estimated Creatinine Clearance: 55.6 mL/min (by C-G formula based on Cr of 1.18).   Assessment: 74 y/o male who presented with CP, also with recent PE and discharged on Xarelto. Last dose of Xarelto was 4/10 pm at home.  He is s/p cath with 3V CAD awaiting CABG on Fri, 4/15. He continues on IV heparin.  Xarelto should be eliminated therefore will use heparin level soley. Heparin level is 0.73 on 1200 units/hr. No bleeding noted, CBC stable.  Follow-up HL is therapeutic at 0.53 on heparin 1100 units/hr. No issues with infusion or bleeding is noted.  Goal of Therapy:  Heparin level 0.3-0.7 units/ml Monitor platelets by anticoagulation protocol: Yes   Plan:  - Continue heparin 1100 units/hr - Daily heparin level and CBC - Monitor for s/sx of bleeding  Andrey Cota. Diona Foley, PharmD Clinical Pharmacist Pager [phone]  04/04/2015 7:56 PM

## 2015-04-04 NOTE — Consult Note (Addendum)
Hospital Consult  Reason for consult: left carotid stenosis, CABG tomorrow Consulting physician: Dr. Roxan Hockey  History of Present Illness  Ryan [name] is a 74 y.o. (March 08, 1941) male who we've been consulted on regarding left carotid stenosis. He will be undergoing CABG tomorrow for severe 3 vessel CAD. His carotid disease has been followed by Dr. Kellie Simmering. He also underwent aortobifemoral bypass by Dr. Kellie Simmering back in 1987.  Previous carotid studies on 10/09/14 demonstrated: RICA <22% stenosis, LICA 02-54% stenosis and was asymptomatic. He was scheduled to follow up again with carotid duplex exam July 2016.  The patient has no history of TIA or stroke symptoms.  The patient denies amaurosis fugax or monocular blindness.  The patient has not had facial drooping or hemiplegia.  The patient has not had receptive or expressive aphasia.  The patient's risks factors for carotid disease include: previous smoker, hyperlipidemia, hypertension, CAD.   Two weeks ago, he was admitted with chest pain and shortness of breath. His cardiac enzymes were negative. A VQ scan indicated possible pulmonary embolus. He was discharged on xarelto. He continued to have pain after discharged and was readmitted due to severe sharp chest pain. He had a cardiac catheterization which revealed chronic occlusion of the RCA and circumflex and progression of LAD disease. His lower extremity duplex showed no evidence of DVT. His CT angiogram was negative for PE.   Past Medical History  Diagnosis Date  . Arthritis   . Myocardial infarction     12    dr Brigitte Pulse PCP          . Hyperlipidemia     takes Crestor daily  . Peripheral vascular disease   . Carotid artery occlusion   . Hypertension     takes Amlodipine daily  . Chronic back pain   . History of gout     has colchicine prn  . History of colon polyps   . Urinary frequency   . Urinary urgency   . Enlarged prostate     takes Rapaflo daily  . History of kidney stones    . Hyperthyroidism     takes SYnthroid daily as instructed  . Cataract     Bil eyes/worse in left eye  . GERD (gastroesophageal reflux disease)     occasional  . Pulmonary emboli 03/20/2015    elevated d-dimer, intermediate V/Q study, atypical chest pain and SOB. Start on Xarelto 20mg  BID for 3 month  . Renal insufficiency     Past Surgical History  Procedure Laterality Date  . Femoral artery - popliteal artery bypass graft    . Joint replacement      shoulder  . Back surgery      5 times  . Lumbar laminectomy/decompression microdiscectomy  02/12/2012    Procedure: LUMBAR LAMINECTOMY/DECOMPRESSION MICRODISCECTOMY;  Surgeon: Floyce Stakes, MD;  Location: [facility];  Service: Neurosurgery;  Laterality: N/A;  Lumbar four-five laminectomy  . Appendectomy    . Cataract surgery      left eye  . Colonoscopy    . Big toe surgery    . Cystoscopy    . Lumbar laminectomy  01/06/2013    Procedure: MICRODISCECTOMY LUMBAR LAMINECTOMY;  Surgeon: Marybelle Killings, MD;  Location: [facility];  Service: Orthopedics;  Laterality: N/A;  L3-4 decompression  . Cardiac catheterization      2010    dr Acie Fredrickson  . Eye surgery    . Total hip arthroplasty Left 01/09/2014    DR Ninfa Linden  .  Total hip arthroplasty Left 01/09/2014    Procedure: LEFT TOTAL HIP ARTHROPLASTY ANTERIOR APPROACH and Steroid Injection Right hip;  Surgeon: Mcarthur Rossetti, MD;  Location: [facility];  Service: Orthopedics;  Laterality: Left;  . Steriod injection Right 01/09/2014    Procedure: STEROID INJECTION;  Surgeon: Mcarthur Rossetti, MD;  Location: [facility];  Service: Orthopedics;  Laterality: Right;  . Left heart catheterization with coronary angiogram N/A 04/03/2015    Procedure: LEFT HEART CATHETERIZATION WITH CORONARY ANGIOGRAM;  Surgeon: Troy Sine, MD;  Location: [facility];  Service: Cardiovascular;  Laterality: N/A;    History   Social History  . Marital Status: Married    Spouse Name: N/A  . Number of Children:  N/A  . Years of Education: N/A   Occupational History  . Not on file.   Social History Main Topics  . Smoking status: Former Smoker    Types: Cigarettes    Quit date: 02/04/1987  . Smokeless tobacco: Former Systems developer    Types: Chew     Comment: quit 35+yrs ago  . Alcohol Use: No  . Drug Use: No  . Sexual Activity: Not Currently   Other Topics Concern  . Not on file   Social History Narrative    Family History  Problem Relation Age of Onset  . Heart disease Father   . Heart attack Father   . Heart disease Sister   . Hypertension Mother   . Diabetes Son    No current facility-administered medications on file prior to encounter.   Current Outpatient Prescriptions on File Prior to Encounter  Medication Sig Dispense Refill  . amLODipine (NORVASC) 5 MG tablet Take 5 mg by mouth at bedtime.     . nitroGLYCERIN (NITROSTAT) 0.4 MG SL tablet Place 1 tablet (0.4 mg total) under the tongue every 5 (five) minutes as needed for chest pain. 25 tablet 6  . Rivaroxaban (XARELTO STARTER PACK) 15 & 20 MG TBPK Take as directed on package: Start with one 15mg  tablet by mouth twice a day with food. On Day 22, switch to one 20mg  tablet once a day with food. 51 each 0  . rivaroxaban (XARELTO) 20 MG TABS tablet Take 1 tablet (20 mg total) by mouth daily with supper. 30 tablet 1  . rosuvastatin (CRESTOR) 20 MG tablet Take 1 tablet (20 mg total) by mouth daily. (Patient taking differently: Take 20 mg by mouth daily at 6 PM. ) 90 tablet 3  . silodosin (RAPAFLO) 8 MG CAPS capsule Take 8 mg by mouth at bedtime.     Marland Kitchen HYDROcodone-acetaminophen (NORCO) 5-325 MG per tablet Take 1-2 tablets by mouth every 6 (six) hours as needed for moderate pain or severe pain. (Patient not taking: Reported on 04/01/2015) 60 tablet 0  . metoprolol tartrate (LOPRESSOR) 25 MG tablet Take 0.5 tablets (12.5 mg total) by mouth 2 (two) times daily. (Patient not taking: Reported on 04/01/2015) 30 tablet 5    Allergies  Allergen  Reactions  . Codeine Nausea And Vomiting         REVIEW OF SYSTEMS:  (Positives checked otherwise negative)  CARDIOVASCULAR:  []  chest pain, []  chest pressure, []  palpitations, []  shortness of breath when laying flat, [x]  shortness of breath with exertion,  []  pain in feet when walking, []  pain in feet when laying flat, []  history of blood clot in veins (DVT), []  history of phlebitis, []  swelling in legs, []  varicose veins  PULMONARY:  []  productive cough, []  asthma, []   wheezing  NEUROLOGIC:  []  weakness in arms or legs, []  numbness in arms or legs, []  difficulty speaking or slurred speech, []  temporary loss of vision in one eye, []  dizziness  HEMATOLOGIC:  []  bleeding problems, []  problems with blood clotting too easily  MUSCULOSKEL:  [x]  joint pain, []  joint swelling  GASTROINTEST:  []  vomiting blood, []  blood in stool     GENITOURINARY:  []  burning with urination, []  blood in urine  PSYCHIATRIC:  []  history of major depression  INTEGUMENTARY:  []  rashes, []  ulcers  CONSTITUTIONAL:  []  fever, []  chills  For VQI Use Only  PRE-ADM LIVING: Home  AMB STATUS: Ambulatory  CAD Sx: Unstable Angina  PRIOR CHF: None  STRESS TEST: [ ]  No, [ ]  Normal, [x]  + ischemia, [ ]  + MI, [ ]  Both   Physical Examination  Filed Vitals:   04/03/15 2041 04/03/15 2229 04/04/15 0043 04/04/15 0435  BP: 144/55 117/57 112/54 123/68  Pulse: 81 80 76 74  Temp:    97.9 F (36.6 C)  TempSrc:    Oral  Resp: 18   18  Height:      Weight:      SpO2: 97%   96%   Body mass index is 28.66 kg/(m^2).  General: A&O x 3, WDWN male in NAD  Head: [place]/AT  Neck: Supple  Pulmonary: Sym exp, good air movt, CTAB, no rales, rhonchi, & wheezing  Cardiac: RRR, Nl S1, S2, no Murmurs, rubs or gallops  Vascular: Right carotid bruit,  palpable right radial pulse, nonpalpable left radial pulse, 2+ dorsalis pedis pulses bilaterally   Musculoskeletal: M/S 5/5 throughout. Extremities without ischemic changes.     Neurologic: CN 2-12 grossly intact, Pain and light touch intact in extremities, Motor exam as listed above  Psychiatric: Judgment intact, Mood & affect appropriate for pt's clinical situation  Dermatologic: See M/S exam for extremity exam, no rashes otherwise noted  Non-Invasive Vascular Imaging  CAROTID DUPLEX (Date: 04/04/2015):   R ICA stenosis: 40-59%  R VA:  patent and antegrade  L ICA stenosis: 80-99%  L VA:  patent and antegrade  Laboratory: CBC:    Component Value Date/Time   WBC 9.0 04/04/2015 0233   RBC 3.94* 04/04/2015 0233   HGB 12.3* 04/04/2015 0233   HCT 37.0* 04/04/2015 0233   PLT 234 04/04/2015 0233   MCV 93.9 04/04/2015 0233   MCH 31.2 04/04/2015 0233   MCHC 33.2 04/04/2015 0233   RDW 12.7 04/04/2015 0233   LYMPHSABS 2.7 03/20/2015 0955   MONOABS 0.7 03/20/2015 0955   EOSABS 0.1 03/20/2015 0955   BASOSABS 0.0 03/20/2015 0955    BMP:    Component Value Date/Time   NA 138 04/02/2015 0110   K 3.9 04/02/2015 0110   CL 102 04/02/2015 0110   CO2 26 04/02/2015 0110   GLUCOSE 106* 04/02/2015 0110   BUN 16 04/02/2015 0110   CREATININE 1.18 04/02/2015 0110   CALCIUM 8.8 04/02/2015 0110   GFRNONAA 59* 04/02/2015 0110   GFRAA 69* 04/02/2015 0110    Coagulation: Lab Results  Component Value Date   INR 1.04 03/21/2015   INR 1.0 03/06/2009   No results found for: PTT  Radiology: No results found.  Medical Decision Making  BRALON ANTKOWIAK is a 74 y.o. male who presents with asymptomatic left ICA stenosis 80-99% and right ICA stenosis of 40-59% This has shown progression from his duplex studies back in October 2015. At that time his left ICA stenosis was  60-79% and right <40%. The patient will undergo CABG tomorrow for severe three vessel CAD. Given his asymptomatic status, there are no indications for combined CABG and carotid endarterectomy. We will arrange outpatient follow with Dr. Kellie Simmering to discuss carotid intervention after he recovers from  his cardiac surgery. Dr. Bridgett Larsson to see patient.   Virgina Jock, PA-C [facility] Office: [phone] Pager: [phone]  04/04/2015, 2:26 PM   Addendum  I have independently interviewed and examined the patient, and I agree with the physician assistant's findings.  Pt has asx LICA stenosis possibly >80%.  The most current expert opinion in vascular surgery is now against combined carotid-CABG procedures, rather treating the sx disease first.  The patient already has follow up with Dr. Kellie Simmering in July.  I would simply repeat the B carotid duplex at that time, rather than trying confirm the carotid duplex with a neck CTA.  Adele Barthel, MD [facility] Office: [phone] Pager: [phone]  04/04/2015, 3:33 PM

## 2015-04-04 NOTE — Progress Notes (Signed)
Pre-op Cardiac Surgery  Carotid Findings:  Right = 40-59% ICA stenosis. Left = 80-99% ICA stenosis. Antegrade vertebral flow.   Upper Extremity Right Left  Brachial Pressures 126   Radial Waveforms Tri Tri  Ulnar Waveforms Tri Tri  Palmar Arch (Allen's Test) normal Decreases >50 % with radial compression, normal with ulnar compression     Adequate flow in pedal arteries with Duplex imaging.    Landry Mellow, RDMS, RVT 04/04/2015

## 2015-04-04 NOTE — Progress Notes (Signed)
Patient Profile: 74 yo with hx of CAD, admitted for CP. LHC showed severe multivessel CAD. Now awaiting CABG. Also with ? PE with VQ scan suggested intermediate probability. No findings on CT.   Subjective: Pt is anxious.  No CP    Objective: Vital signs in last 24 hours: Temp:  [97.5 F (36.4 C)-97.9 F (36.6 C)] 97.9 F (36.6 C) (04/14 0435) Pulse Rate:  [67-81] 74 (04/14 0435) Resp:  [18] 18 (04/14 0435) BP: (105-144)/(52-77) 123/68 mmHg (04/14 0435) SpO2:  [95 %-97 %] 96 % (04/14 0435) Last BM Date: 04/03/15  Intake/Output from previous day: 04/13 0701 - 04/14 0700 In: 1797 [P.O.:120; I.V.:1677] Out: 1200 [Urine:1200] Intake/Output this shift: Total I/O In: -  Out: 300 [Urine:300]  Medications Current Facility-Administered Medications  Medication Dose Route Frequency Provider Last Rate Last Dose  . 0.9 %  sodium chloride infusion  250 mL Intravenous PRN Mihai Croitoru, MD      . 0.9 %  sodium chloride infusion   Intravenous Continuous Troy Sine, MD   Stopped at 04/04/15 0200  . acetaminophen (TYLENOL) tablet 650 mg  650 mg Oral Q4H PRN Mihai Croitoru, MD      . amLODipine (NORVASC) tablet 5 mg  5 mg Oral QHS Mihai Croitoru, MD   5 mg at 04/03/15 2120  . aspirin EC tablet 81 mg  81 mg Oral Daily Mihai Croitoru, MD   81 mg at 04/02/15 1103  . heparin ADULT infusion 100 units/mL (25000 units/250 mL)  1,200 Units/hr Intravenous Continuous Donalynn Furlong [place], [facility] 12 mL/hr at 04/03/15 2120 1,200 Units/hr at 04/03/15 2120  . metoprolol tartrate (LOPRESSOR) tablet 12.5 mg  12.5 mg Oral BID Sanda Klein, MD   12.5 mg at 04/03/15 2120  . morphine 2 MG/ML injection 2-4 mg  2-4 mg Intravenous Q1H PRN Evelene Croon Barrett, PA-C   2 mg at 04/03/15 2041  . nitroGLYCERIN (NITROSTAT) SL tablet 0.4 mg  0.4 mg Sublingual Q5 min PRN Quintella Reichert, MD   0.4 mg at 04/01/15 1141  . nitroGLYCERIN 50 mg in dextrose 5 % 250 mL (0.2 mg/mL) infusion  0-200 mcg/min Intravenous Continuous Troy Sine, MD 4.5 mL/hr at 04/03/15 2109 15 mcg/min at 04/03/15 2109  . ondansetron (ZOFRAN) injection 4 mg  4 mg Intravenous Q6H PRN Sanda Klein, MD   4 mg at 04/02/15 0811  . rosuvastatin (CRESTOR) tablet 20 mg  20 mg Oral q1800 Mihai Croitoru, MD   20 mg at 04/03/15 1756  . sodium chloride 0.9 % injection 3 mL  3 mL Intravenous Q12H Mihai Croitoru, MD   3 mL at 04/03/15 2228  . sodium chloride 0.9 % injection 3 mL  3 mL Intravenous PRN Mihai Croitoru, MD      . tamsulosin (FLOMAX) capsule 0.4 mg  0.4 mg Oral QPC supper Mihai Croitoru, MD   0.4 mg at 04/03/15 1756    PE: BP 123/68 mmHg  Pulse 74  Temp(Src) 97.9 F (36.6 C) (Oral)  Resp 18  Ht 5\' 6"  (1.676 m)  Wt 177 lb 8 oz (80.513 kg)  BMI 28.66 kg/m2  SpO2 96% HEENT:   No JVD Lungs: clear  Cor:  RR Abd:  + BS ,  Ext , no clubbing cyanosis , no edema Skin , normal Psych:  Normal , anxious Neuro: CN intact   Lab Results:   Recent Labs  04/02/15 0110 04/03/15 0320 04/04/15 0233  WBC 9.2 10.3 9.0  HGB 13.4 13.4 12.3*  HCT 40.1 39.8 37.0*  PLT 252 241 234   BMET  Recent Labs  04/02/15 0110  NA 138  K 3.9  CL 102  CO2 26  GLUCOSE 106*  BUN 16  CREATININE 1.18  CALCIUM 8.8    Assessment/Plan   Active Problems:   Pulmonary embolism   Pain in the chest   Coronary artery disease due to calcified coronary lesion   1. CAD: severe multivessel disease. CABG scheduled with Dr. Roxan Hockey tomorrow. Continue IV heparin, ASA, BB and statin.   2. PE: VQ scan suggested intermediate probability. No findings on CT.  Per CT Surgery, the the questionable diagnosis of a PE should not delay his CABG. Continue IV heparin.     LOS: 2 days    Ramond Dial., MD, [facility] 04/04/2015, 12:53 PM [address]  [place] Pager [phone]

## 2015-04-04 NOTE — Progress Notes (Signed)
ANTICOAGULATION CONSULT NOTE - Follow Up Consult  Pharmacy Consult for heparin (on Xarelto PTA) Indication: 3V CAD, awaiting TCTS consult  Allergies  Allergen Reactions  . Codeine Nausea And Vomiting         Patient Measurements: Height: 5\' 6"  (167.6 cm) Weight: 177 lb 8 oz (80.513 kg) IBW/kg (Calculated) : 63.8 Heparin Dosing Weight: 81 kg  Vital Signs: Temp: 97.9 F (36.6 C) (04/14 0435) Temp Source: Oral (04/14 0435) BP: 123/68 mmHg (04/14 0435) Pulse Rate: 74 (04/14 0435)  Labs:  Recent Labs  04/01/15 1950  04/02/15 0110 04/02/15 0835  04/03/15 0320 04/04/15 0233 04/04/15 0753  HGB  --   < > 13.4  --   --  13.4 12.3*  --   HCT  --   --  40.1  --   --  39.8 37.0*  --   PLT  --   --  252  --   --  241 234  --   APTT  --   --  38*  --   < > 91* 56* 68*  HEPARINUNFRC  --   --  >2.20*  --   --   --  0.37 0.73*  CREATININE  --   --  1.18  --   --   --   --   --   TROPONINI 0.05*  --  0.06* 0.04*  --   --   --   --   < > = values in this interval not displayed.  Estimated Creatinine Clearance: 55.6 mL/min (by C-G formula based on Cr of 1.18).   Assessment: 74 y/o male who presented with CP, also with recent PE and discharged on Xarelto. Last dose of Xarelto was 4/10 pm at home.  He is s/p cath with 3V CAD awaiting CABG on Fri, 4/15. He continues on IV heparin.  Xarelto should be eliminated therefore will use heparin level soley. Heparin level is 0.73 on 1200 units/hr. No bleeding noted, CBC stable.  Goal of Therapy:  Heparin level 0.3-0.7 units/ml Monitor platelets by anticoagulation protocol: Yes   Plan:  - Decrease heparin drip to 1100 units/hr - 8 hr heparin level - Daily heparin level and CBC - Monitor for s/sx of bleeding  [facility], [place].D., BCPS Clinical Pharmacist Pager: [phone] 04/04/2015 10:24 AM

## 2015-04-04 NOTE — Progress Notes (Signed)
[id number] Discussed with pt and family importance of walking and IS after surgery. Pt stated can get to 2556ml on IS. Demonstrated for pt how to get up and down without use of arms due to sternal precautions. Pt stated knees strong but hips bother him with walking. Gave [facility] booklet and care guide. Wrote how to view pre op video. Pt tearful. Emotional support given. Family knows pt will need 24/7 care first week home. Will see after surgery. Graylon Good RN BSN 04/04/2015 12:24 PM

## 2015-04-04 NOTE — Progress Notes (Signed)
1 Day Post-Op Procedure(s) (LRB): LEFT HEART CATHETERIZATION WITH CORONARY ANGIOGRAM (N/A) Subjective: Anxious about surgery  Objective: Vital signs in last 24 hours: Temp:  [97.9 F (36.6 C)-98 F (36.7 C)] 98 F (36.7 C) (04/14 1447) Pulse Rate:  [74-81] 79 (04/14 1447) Cardiac Rhythm:  [-] Normal sinus rhythm (04/14 1458) Resp:  [18] 18 (04/14 1447) BP: (112-144)/(54-68) 125/58 mmHg (04/14 1447) SpO2:  [96 %-97 %] 97 % (04/14 1447)  Hemodynamic parameters for last 24 hours:    Intake/Output from previous day: 04/13 0701 - 04/14 0700 In: 0940 [P.O.:120; I.V.:1677] Out: 1200 [Urine:1200] Intake/Output this shift: Total I/O In: 240 [P.O.:240] Out: 300 [Urine:300]  General appearance: alert, cooperative and no distress Neurologic: intact Heart: regular rate and rhythm Lungs: clear to auscultation bilaterally  Lab Results:  Recent Labs  04/03/15 0320 04/04/15 0233  WBC 10.3 9.0  HGB 13.4 12.3*  HCT 39.8 37.0*  PLT 241 234   BMET:  Recent Labs  04/02/15 0110  NA 138  K 3.9  CL 102  CO2 26  GLUCOSE 106*  BUN 16  CREATININE 1.18  CALCIUM 8.8    PT/INR: No results for input(s): LABPROT, INR in the last 72 hours. ABG No results found for: PHART, HCO3, TCO2, ACIDBASEDEF, O2SAT CBG (last 3)  No results for input(s): GLUCAP in the last 72 hours.  Assessment/Plan: S/P Procedure(s) (LRB): LEFT HEART CATHETERIZATION WITH CORONARY ANGIOGRAM (N/A) -  Severe 3 vessel CAD with unstable coronary syndrome  For CABG in AM  Was seen by Dr. Bridgett Larsson for 76-80% LICA stenosis- no intervention indicated at this time  He and his wife are aware of his increased risk for stroke  All questions answered   LOS: 2 days    [name] 04/04/2015

## 2015-04-05 ENCOUNTER — Inpatient Hospital Stay (HOSPITAL_COMMUNITY): Payer: Medicare Other | Admitting: Anesthesiology

## 2015-04-05 ENCOUNTER — Encounter (HOSPITAL_COMMUNITY): Payer: Self-pay | Admitting: Certified Registered Nurse Anesthetist

## 2015-04-05 ENCOUNTER — Inpatient Hospital Stay (HOSPITAL_COMMUNITY): Payer: Medicare Other

## 2015-04-05 ENCOUNTER — Encounter (HOSPITAL_COMMUNITY)
Admission: EM | Disposition: A | Discharge status: Home / Self Care | Payer: Medicare Other | Source: Home / Self Care | Attending: Thoracic Surgery (Cardiothoracic Vascular Surgery)

## 2015-04-05 DIAGNOSIS — Z951 Presence of aortocoronary bypass graft: Secondary | ICD-10-CM

## 2015-04-05 HISTORY — PX: TEE WITHOUT CARDIOVERSION: SHX5443

## 2015-04-05 HISTORY — PX: CORONARY ARTERY BYPASS GRAFT: SHX141

## 2015-04-05 LAB — CBC
HCT: 37.7 % — ABNORMAL LOW (ref 39.0–52.0)
HCT: 33.2 % — ABNORMAL LOW (ref 39.0–52.0)
HCT: 34.5 % — ABNORMAL LOW (ref 39.0–52.0)
Hemoglobin: 12.4 g/dL — ABNORMAL LOW (ref 13.0–17.0)
Hemoglobin: 11.4 g/dL — ABNORMAL LOW (ref 13.0–17.0)
Hemoglobin: 11.9 g/dL — ABNORMAL LOW (ref 13.0–17.0)
MCH: 30.8 pg (ref 26.0–34.0)
MCH: 31.7 pg (ref 26.0–34.0)
MCH: 31.4 pg (ref 26.0–34.0)
MCHC: 32.9 g/dL (ref 30.0–36.0)
MCHC: 34.3 g/dL (ref 30.0–36.0)
MCHC: 34.5 g/dL (ref 30.0–36.0)
MCV: 93.5 fL (ref 78.0–100.0)
MCV: 92.2 fL (ref 78.0–100.0)
MCV: 91 fL (ref 78.0–100.0)
PLATELETS: 177 10*3/uL (ref 150–400)
Platelets: 238 10*3/uL (ref 150–400)
Platelets: 160 10*3/uL (ref 150–400)
RBC: 4.03 MIL/uL — ABNORMAL LOW (ref 4.22–5.81)
RBC: 3.6 MIL/uL — ABNORMAL LOW (ref 4.22–5.81)
RBC: 3.79 MIL/uL — ABNORMAL LOW (ref 4.22–5.81)
RDW: 12.7 % (ref 11.5–15.5)
RDW: 12.6 % (ref 11.5–15.5)
RDW: 12.4 % (ref 11.5–15.5)
WBC: 9.5 10*3/uL (ref 4.0–10.5)
WBC: 16 10*3/uL — ABNORMAL HIGH (ref 4.0–10.5)
WBC: 20.9 10*3/uL — AB (ref 4.0–10.5)

## 2015-04-05 LAB — POCT I-STAT, CHEM 8
BUN: 12 mg/dL (ref 6–23)
BUN: 10 mg/dL (ref 6–23)
BUN: 15 mg/dL (ref 6–23)
BUN: 10 mg/dL (ref 6–23)
BUN: 11 mg/dL (ref 6–23)
BUN: 9 mg/dL (ref 6–23)
CALCIUM ION: 1.07 mmol/L — AB (ref 1.13–1.30)
CALCIUM ION: 1.08 mmol/L — AB (ref 1.13–1.30)
CHLORIDE: 103 mmol/L (ref 96–112)
CHLORIDE: 101 mmol/L (ref 96–112)
CREATININE: 0.8 mg/dL (ref 0.50–1.35)
CREATININE: 0.7 mg/dL (ref 0.50–1.35)
CREATININE: 0.7 mg/dL (ref 0.50–1.35)
Calcium, Ion: 1.18 mmol/L (ref 1.13–1.30)
Calcium, Ion: 1.02 mmol/L — ABNORMAL LOW (ref 1.13–1.30)
Calcium, Ion: 1.15 mmol/L (ref 1.13–1.30)
Calcium, Ion: 1.05 mmol/L — ABNORMAL LOW (ref 1.13–1.30)
Chloride: 102 mmol/L (ref 96–112)
Chloride: 97 mmol/L (ref 96–112)
Chloride: 100 mmol/L (ref 96–112)
Chloride: 105 mmol/L (ref 96–112)
Creatinine, Ser: 0.8 mg/dL (ref 0.50–1.35)
Creatinine, Ser: 0.7 mg/dL (ref 0.50–1.35)
Creatinine, Ser: 0.9 mg/dL (ref 0.50–1.35)
GLUCOSE: 208 mg/dL — AB (ref 70–99)
GLUCOSE: 130 mg/dL — AB (ref 70–99)
Glucose, Bld: 153 mg/dL — ABNORMAL HIGH (ref 70–99)
Glucose, Bld: 174 mg/dL — ABNORMAL HIGH (ref 70–99)
Glucose, Bld: 176 mg/dL — ABNORMAL HIGH (ref 70–99)
Glucose, Bld: 121 mg/dL — ABNORMAL HIGH (ref 70–99)
HCT: 35 % — ABNORMAL LOW (ref 39.0–52.0)
HCT: 24 % — ABNORMAL LOW (ref 39.0–52.0)
HCT: 25 % — ABNORMAL LOW (ref 39.0–52.0)
HCT: 26 % — ABNORMAL LOW (ref 39.0–52.0)
HEMATOCRIT: 34 % — AB (ref 39.0–52.0)
HEMATOCRIT: 35 % — AB (ref 39.0–52.0)
HEMOGLOBIN: 11.9 g/dL — AB (ref 13.0–17.0)
Hemoglobin: 11.6 g/dL — ABNORMAL LOW (ref 13.0–17.0)
Hemoglobin: 8.2 g/dL — ABNORMAL LOW (ref 13.0–17.0)
Hemoglobin: 8.5 g/dL — ABNORMAL LOW (ref 13.0–17.0)
Hemoglobin: 8.8 g/dL — ABNORMAL LOW (ref 13.0–17.0)
Hemoglobin: 11.9 g/dL — ABNORMAL LOW (ref 13.0–17.0)
POTASSIUM: 3.8 mmol/L (ref 3.5–5.1)
Potassium: 4 mmol/L (ref 3.5–5.1)
Potassium: 4.9 mmol/L (ref 3.5–5.1)
Potassium: 5.2 mmol/L — ABNORMAL HIGH (ref 3.5–5.1)
Potassium: 4.7 mmol/L (ref 3.5–5.1)
Potassium: 4 mmol/L (ref 3.5–5.1)
SODIUM: 137 mmol/L (ref 135–145)
SODIUM: 136 mmol/L (ref 135–145)
SODIUM: 135 mmol/L (ref 135–145)
Sodium: 134 mmol/L — ABNORMAL LOW (ref 135–145)
Sodium: 133 mmol/L — ABNORMAL LOW (ref 135–145)
Sodium: 138 mmol/L (ref 135–145)
TCO2: 23 mmol/L (ref 0–100)
TCO2: 20 mmol/L (ref 0–100)
TCO2: 24 mmol/L (ref 0–100)
TCO2: 20 mmol/L (ref 0–100)
TCO2: 21 mmol/L (ref 0–100)
TCO2: 18 mmol/L (ref 0–100)

## 2015-04-05 LAB — POCT I-STAT 3, ART BLOOD GAS (G3+)
ACID-BASE DEFICIT: 4 mmol/L — AB (ref 0.0–2.0)
Acid-base deficit: 1 mmol/L (ref 0.0–2.0)
Acid-base deficit: 6 mmol/L — ABNORMAL HIGH (ref 0.0–2.0)
Acid-base deficit: 5 mmol/L — ABNORMAL HIGH (ref 0.0–2.0)
Bicarbonate: 23.2 mEq/L (ref 20.0–24.0)
Bicarbonate: 21 mEq/L (ref 20.0–24.0)
Bicarbonate: 19.5 mEq/L — ABNORMAL LOW (ref 20.0–24.0)
Bicarbonate: 20.7 mEq/L (ref 20.0–24.0)
O2 SAT: 92 %
O2 Saturation: 100 %
O2 Saturation: 96 %
O2 Saturation: 98 %
PCO2 ART: 38.2 mmHg (ref 35.0–45.0)
PH ART: 7.407 (ref 7.350–7.450)
PH ART: 7.316 — AB (ref 7.350–7.450)
PO2 ART: 331 mmHg — AB (ref 80.0–100.0)
Patient temperature: 35.9
Patient temperature: 37
TCO2: 24 mmol/L (ref 0–100)
TCO2: 22 mmol/L (ref 0–100)
TCO2: 21 mmol/L (ref 0–100)
TCO2: 22 mmol/L (ref 0–100)
pCO2 arterial: 36.9 mmHg (ref 35.0–45.0)
pCO2 arterial: 36 mmHg (ref 35.0–45.0)
pCO2 arterial: 39.5 mmHg (ref 35.0–45.0)
pH, Arterial: 7.368 (ref 7.350–7.450)
pH, Arterial: 7.33 — ABNORMAL LOW (ref 7.350–7.450)
pO2, Arterial: 63 mmHg — ABNORMAL LOW (ref 80.0–100.0)
pO2, Arterial: 86 mmHg (ref 80.0–100.0)
pO2, Arterial: 124 mmHg — ABNORMAL HIGH (ref 80.0–100.0)

## 2015-04-05 LAB — POCT I-STAT 4, (NA,K, GLUC, HGB,HCT)
Glucose, Bld: 142 mg/dL — ABNORMAL HIGH (ref 70–99)
HCT: 33 % — ABNORMAL LOW (ref 39.0–52.0)
HEMOGLOBIN: 11.2 g/dL — AB (ref 13.0–17.0)
Potassium: 4 mmol/L (ref 3.5–5.1)
Sodium: 137 mmol/L (ref 135–145)

## 2015-04-05 LAB — GLUCOSE, CAPILLARY
GLUCOSE-CAPILLARY: 109 mg/dL — AB (ref 70–99)
GLUCOSE-CAPILLARY: 107 mg/dL — AB (ref 70–99)
GLUCOSE-CAPILLARY: 114 mg/dL — AB (ref 70–99)
Glucose-Capillary: 92 mg/dL (ref 70–99)
Glucose-Capillary: 111 mg/dL — ABNORMAL HIGH (ref 70–99)
Glucose-Capillary: 115 mg/dL — ABNORMAL HIGH (ref 70–99)

## 2015-04-05 LAB — PREPARE PLATELET PHERESIS
Unit division: 0
Unit division: 0

## 2015-04-05 LAB — PROTIME-INR
INR: 1.36 (ref 0.00–1.49)
Prothrombin Time: 16.9 seconds — ABNORMAL HIGH (ref 11.6–15.2)

## 2015-04-05 LAB — SURGICAL PCR SCREEN
MRSA, PCR: NEGATIVE
Staphylococcus aureus: NEGATIVE

## 2015-04-05 LAB — HEPARIN LEVEL (UNFRACTIONATED): Heparin Unfractionated: 0.39 IU/mL (ref 0.30–0.70)

## 2015-04-05 LAB — TYPE AND SCREEN
ABO/RH(D): A POS
ANTIBODY SCREEN: NEGATIVE

## 2015-04-05 LAB — CREATININE, SERUM
CREATININE: 0.94 mg/dL (ref 0.50–1.35)
GFR calc Af Amer: >90 mL/min (ref >90)
GFR calc non Af Amer: 81 mL/min — ABNORMAL LOW (ref >90)

## 2015-04-05 LAB — MAGNESIUM: MAGNESIUM: 3.1 mg/dL — AB (ref 1.5–2.5)

## 2015-04-05 LAB — HEMOGLOBIN AND HEMATOCRIT, BLOOD
HEMATOCRIT: 24.6 % — AB (ref 39.0–52.0)
Hemoglobin: 8.3 g/dL — ABNORMAL LOW (ref 13.0–17.0)

## 2015-04-05 LAB — APTT: APTT: 29 s (ref 24–37)

## 2015-04-05 LAB — PLATELET COUNT: Platelets: 166 10*3/uL (ref 150–400)

## 2015-04-05 SURGERY — CORONARY ARTERY BYPASS GRAFTING (CABG)
Anesthesia: General | Site: Chest

## 2015-04-05 MED ORDER — VANCOMYCIN HCL IN DEXTROSE 1-5 GM/200ML-% IV SOLN
1000.0000 mg | Freq: Once | INTRAVENOUS | Status: AC
  Administered 2015-04-05: 1000 mg via INTRAVENOUS
  Filled 2015-04-05: qty 200

## 2015-04-05 MED ORDER — CHLORHEXIDINE GLUCONATE 0.12 % MT SOLN: 15.0000 mL | Freq: Two times a day (BID) | OROMUCOSAL | Status: DC

## 2015-04-05 MED ORDER — MIDAZOLAM HCL 2 MG/2ML IJ SOLN: 2.0000 mg | INTRAMUSCULAR | Status: DC | PRN

## 2015-04-05 MED ORDER — TEMAZEPAM 15 MG PO CAPS: 15.0000 mg | ORAL_CAPSULE | Freq: Once | ORAL | Status: DC | PRN

## 2015-04-05 MED ORDER — MIDAZOLAM HCL 10 MG/2ML IJ SOLN
INTRAMUSCULAR | Status: AC
  Filled 2015-04-05: qty 2

## 2015-04-05 MED ORDER — FENTANYL CITRATE (PF) 250 MCG/5ML IJ SOLN
INTRAMUSCULAR | Status: AC
  Filled 2015-04-05: qty 5

## 2015-04-05 MED ORDER — SODIUM CHLORIDE 0.9 % IV SOLN: 250.0000 mL | INTRAVENOUS | Status: DC

## 2015-04-05 MED ORDER — MORPHINE SULFATE 2 MG/ML IJ SOLN
1.0000 mg | INTRAMUSCULAR | Status: AC | PRN
  Administered 2015-04-05: 1 mg via INTRAVENOUS
  Administered 2015-04-05: 2 mg via INTRAVENOUS
  Filled 2015-04-05 (×2): qty 1

## 2015-04-05 MED ORDER — PROTAMINE SULFATE 10 MG/ML IV SOLN
INTRAVENOUS | Status: AC
  Filled 2015-04-05: qty 5

## 2015-04-05 MED ORDER — ASPIRIN 81 MG PO CHEW: 324.0000 mg | CHEWABLE_TABLET | Freq: Every day | ORAL | Status: DC

## 2015-04-05 MED ORDER — ARTIFICIAL TEARS OP OINT
TOPICAL_OINTMENT | OPHTHALMIC | Status: DC | PRN
  Administered 2015-04-05: 1 via OPHTHALMIC

## 2015-04-05 MED ORDER — ASPIRIN EC 325 MG PO TBEC
325.0000 mg | DELAYED_RELEASE_TABLET | Freq: Every day | ORAL | Status: DC
  Administered 2015-04-06 – 2015-04-07 (×2): 325 mg via ORAL
  Filled 2015-04-05 (×2): qty 1

## 2015-04-05 MED ORDER — ACETAMINOPHEN 650 MG RE SUPP
650.0000 mg | Freq: Once | RECTAL | Status: AC
  Administered 2015-04-05: 650 mg via RECTAL

## 2015-04-05 MED ORDER — SODIUM CHLORIDE 0.9 % IJ SOLN
3.0000 mL | Freq: Two times a day (BID) | INTRAMUSCULAR | Status: DC
  Administered 2015-04-06 – 2015-04-07 (×2): 3 mL via INTRAVENOUS

## 2015-04-05 MED ORDER — FAMOTIDINE IN NACL 20-0.9 MG/50ML-% IV SOLN
20.0000 mg | Freq: Two times a day (BID) | INTRAVENOUS | Status: AC
  Administered 2015-04-05: 20 mg via INTRAVENOUS

## 2015-04-05 MED ORDER — PHENYLEPHRINE 40 MCG/ML (10ML) SYRINGE FOR IV PUSH (FOR BLOOD PRESSURE SUPPORT)
PREFILLED_SYRINGE | INTRAVENOUS | Status: AC
  Filled 2015-04-05: qty 10

## 2015-04-05 MED ORDER — ACETAMINOPHEN 160 MG/5ML PO SOLN
1000.0000 mg | Freq: Four times a day (QID) | ORAL | Status: DC
  Filled 2015-04-05: qty 40

## 2015-04-05 MED ORDER — 0.9 % SODIUM CHLORIDE (POUR BTL) OPTIME
TOPICAL | Status: DC | PRN
  Administered 2015-04-05: 1000 mL

## 2015-04-05 MED ORDER — METOPROLOL TARTRATE 1 MG/ML IV SOLN: 2.5000 mg | INTRAVENOUS | Status: DC | PRN

## 2015-04-05 MED ORDER — LIDOCAINE HCL (CARDIAC) 20 MG/ML IV SOLN
INTRAVENOUS | Status: AC
  Filled 2015-04-05: qty 5

## 2015-04-05 MED ORDER — INSULIN REGULAR BOLUS VIA INFUSION
0.0000 [IU] | Freq: Three times a day (TID) | INTRAVENOUS | Status: DC
  Filled 2015-04-05: qty 10

## 2015-04-05 MED ORDER — DEXMEDETOMIDINE HCL IN NACL 200 MCG/50ML IV SOLN
0.0000 ug/kg/h | INTRAVENOUS | Status: DC
Start: 2015-04-05 — End: 2015-04-07
  Filled 2015-04-05: qty 50

## 2015-04-05 MED ORDER — BISACODYL 5 MG PO TBEC: 5.0000 mg | DELAYED_RELEASE_TABLET | Freq: Once | ORAL | Status: DC

## 2015-04-05 MED ORDER — FENTANYL CITRATE (PF) 100 MCG/2ML IJ SOLN
INTRAMUSCULAR | Status: DC | PRN
  Administered 2015-04-05: 150 ug via INTRAVENOUS
  Administered 2015-04-05: 250 ug via INTRAVENOUS
  Administered 2015-04-05: 1000 ug via INTRAVENOUS
  Administered 2015-04-05 (×2): 50 ug via INTRAVENOUS

## 2015-04-05 MED ORDER — SODIUM CHLORIDE 0.45 % IV SOLN
INTRAVENOUS | Status: DC | PRN
  Administered 2015-04-05: 13:00:00 via INTRAVENOUS

## 2015-04-05 MED ORDER — PHENYLEPHRINE HCL 10 MG/ML IJ SOLN
20.0000 mg | INTRAVENOUS | Status: DC | PRN
  Administered 2015-04-05: 20 ug/min via INTRAVENOUS

## 2015-04-05 MED ORDER — PROPOFOL 10 MG/ML IV BOLUS
INTRAVENOUS | Status: DC | PRN
  Administered 2015-04-05: 40 mg via INTRAVENOUS

## 2015-04-05 MED ORDER — TRAMADOL HCL 50 MG PO TABS
50.0000 mg | ORAL_TABLET | ORAL | Status: DC | PRN
  Administered 2015-04-05 – 2015-04-06 (×3): 50 mg via ORAL
  Filled 2015-04-05 (×3): qty 1

## 2015-04-05 MED ORDER — BISACODYL 5 MG PO TBEC
10.0000 mg | DELAYED_RELEASE_TABLET | Freq: Every day | ORAL | Status: DC
  Administered 2015-04-06 – 2015-04-07 (×2): 10 mg via ORAL
  Filled 2015-04-05 (×2): qty 2

## 2015-04-05 MED ORDER — PROTAMINE SULFATE 10 MG/ML IV SOLN
INTRAVENOUS | Status: AC
  Filled 2015-04-05: qty 25

## 2015-04-05 MED ORDER — PHENYLEPHRINE HCL 10 MG/ML IJ SOLN
10.0000 mg | INTRAVENOUS | Status: DC | PRN
  Administered 2015-04-05: 20 ug/min via INTRAVENOUS

## 2015-04-05 MED ORDER — LACTATED RINGERS IV SOLN
INTRAVENOUS | Status: DC | PRN
  Administered 2015-04-05: 07:00:00 via INTRAVENOUS

## 2015-04-05 MED ORDER — BISACODYL 10 MG RE SUPP
10.0000 mg | Freq: Every day | RECTAL | Status: DC
  Administered 2015-04-08: 10 mg via RECTAL
  Filled 2015-04-05: qty 1

## 2015-04-05 MED ORDER — SODIUM CHLORIDE 0.9 % IV SOLN
INTRAVENOUS | Status: DC
  Administered 2015-04-05: 18:00:00 via INTRAVENOUS
  Filled 2015-04-05: qty 2.5

## 2015-04-05 MED ORDER — NITROGLYCERIN IN D5W 200-5 MCG/ML-% IV SOLN: 0.0000 ug/min | INTRAVENOUS | Status: DC

## 2015-04-05 MED ORDER — DOCUSATE SODIUM 100 MG PO CAPS
200.0000 mg | ORAL_CAPSULE | Freq: Every day | ORAL | Status: DC
  Administered 2015-04-06 – 2015-04-10 (×5): 200 mg via ORAL
  Filled 2015-04-05 (×5): qty 2

## 2015-04-05 MED ORDER — HEMOSTATIC AGENTS (NO CHARGE) OPTIME
TOPICAL | Status: DC | PRN
  Administered 2015-04-05: 1 via TOPICAL

## 2015-04-05 MED ORDER — ACETAMINOPHEN 160 MG/5ML PO SOLN: 650.0000 mg | Freq: Once | ORAL | Status: AC

## 2015-04-05 MED ORDER — EPHEDRINE SULFATE 50 MG/ML IJ SOLN
INTRAMUSCULAR | Status: AC
  Filled 2015-04-05: qty 1

## 2015-04-05 MED ORDER — ROCURONIUM BROMIDE 50 MG/5ML IV SOLN
INTRAVENOUS | Status: AC
  Filled 2015-04-05: qty 4

## 2015-04-05 MED ORDER — PANTOPRAZOLE SODIUM 40 MG PO TBEC
40.0000 mg | DELAYED_RELEASE_TABLET | Freq: Every day | ORAL | Status: DC
  Administered 2015-04-07 – 2015-04-10 (×4): 40 mg via ORAL
  Filled 2015-04-05 (×2): qty 1

## 2015-04-05 MED ORDER — METOPROLOL TARTRATE 25 MG/10 ML ORAL SUSPENSION
12.5000 mg | Freq: Two times a day (BID) | ORAL | Status: DC
  Filled 2015-04-05 (×5): qty 5

## 2015-04-05 MED ORDER — DEXTROSE 5 % IV SOLN
1.5000 g | Freq: Two times a day (BID) | INTRAVENOUS | Status: AC
  Administered 2015-04-05 – 2015-04-07 (×4): 1.5 g via INTRAVENOUS
  Filled 2015-04-05 (×4): qty 1.5

## 2015-04-05 MED ORDER — MORPHINE SULFATE 2 MG/ML IJ SOLN
2.0000 mg | INTRAMUSCULAR | Status: DC | PRN
  Administered 2015-04-05 (×2): 2 mg via INTRAVENOUS
  Administered 2015-04-05 – 2015-04-06 (×4): 4 mg via INTRAVENOUS
  Administered 2015-04-06 (×2): 2 mg via INTRAVENOUS
  Filled 2015-04-05: qty 2
  Filled 2015-04-05 (×3): qty 1
  Filled 2015-04-05 (×3): qty 2
  Filled 2015-04-05: qty 1

## 2015-04-05 MED ORDER — ONDANSETRON HCL 4 MG/2ML IJ SOLN
4.0000 mg | Freq: Four times a day (QID) | INTRAMUSCULAR | Status: DC | PRN
  Administered 2015-04-05 – 2015-04-07 (×3): 4 mg via INTRAVENOUS
  Filled 2015-04-05 (×3): qty 2

## 2015-04-05 MED ORDER — METOPROLOL TARTRATE 12.5 MG HALF TABLET
12.5000 mg | ORAL_TABLET | Freq: Two times a day (BID) | ORAL | Status: DC
  Administered 2015-04-06 – 2015-04-07 (×3): 12.5 mg via ORAL
  Filled 2015-04-05 (×5): qty 1

## 2015-04-05 MED ORDER — SODIUM CHLORIDE 0.9 % IJ SOLN
OROMUCOSAL | Status: DC | PRN
  Administered 2015-04-05: 4 mL via TOPICAL

## 2015-04-05 MED ORDER — SODIUM CHLORIDE 0.9 % IJ SOLN: 3.0000 mL | INTRAMUSCULAR | Status: DC | PRN

## 2015-04-05 MED ORDER — LACTATED RINGERS IV SOLN
INTRAVENOUS | Status: DC | PRN
  Administered 2015-04-05: 08:00:00 via INTRAVENOUS

## 2015-04-05 MED ORDER — HEPARIN SODIUM (PORCINE) 1000 UNIT/ML IJ SOLN
INTRAMUSCULAR | Status: DC | PRN
  Administered 2015-04-05: 2000 [IU] via INTRAVENOUS
  Administered 2015-04-05: 26000 [IU] via INTRAVENOUS

## 2015-04-05 MED ORDER — METOPROLOL TARTRATE 12.5 MG HALF TABLET
12.5000 mg | ORAL_TABLET | Freq: Once | ORAL | Status: AC
  Administered 2015-04-05: 12.5 mg via ORAL
  Filled 2015-04-05: qty 1

## 2015-04-05 MED ORDER — ROCURONIUM BROMIDE 100 MG/10ML IV SOLN
INTRAVENOUS | Status: DC | PRN
  Administered 2015-04-05 (×4): 50 mg via INTRAVENOUS

## 2015-04-05 MED ORDER — ARTIFICIAL TEARS OP OINT
TOPICAL_OINTMENT | OPHTHALMIC | Status: AC
  Filled 2015-04-05: qty 3.5

## 2015-04-05 MED ORDER — POTASSIUM CHLORIDE 10 MEQ/50ML IV SOLN: 10.0000 meq | INTRAVENOUS | Status: AC

## 2015-04-05 MED ORDER — MAGNESIUM SULFATE 4 GM/100ML IV SOLN
4.0000 g | Freq: Once | INTRAVENOUS | Status: AC
  Administered 2015-04-05: 4 g via INTRAVENOUS
  Filled 2015-04-05: qty 100

## 2015-04-05 MED ORDER — ALBUMIN HUMAN 5 % IV SOLN
250.0000 mL | INTRAVENOUS | Status: AC | PRN
  Administered 2015-04-05 (×2): 250 mL via INTRAVENOUS

## 2015-04-05 MED ORDER — OXYCODONE HCL 5 MG PO TABS: 5.0000 mg | ORAL_TABLET | ORAL | Status: DC | PRN

## 2015-04-05 MED ORDER — MIDAZOLAM HCL 5 MG/5ML IJ SOLN
INTRAMUSCULAR | Status: DC | PRN
  Administered 2015-04-05: 1 mg via INTRAVENOUS
  Administered 2015-04-05: 5 mg via INTRAVENOUS
  Administered 2015-04-05: 3 mg via INTRAVENOUS
  Administered 2015-04-05: 1 mg via INTRAVENOUS

## 2015-04-05 MED ORDER — PROPOFOL 10 MG/ML IV BOLUS
INTRAVENOUS | Status: AC
  Filled 2015-04-05: qty 20

## 2015-04-05 MED ORDER — LACTATED RINGERS IV SOLN: INTRAVENOUS | Status: DC

## 2015-04-05 MED ORDER — SODIUM CHLORIDE 0.9 % IV SOLN: INTRAVENOUS | Status: DC

## 2015-04-05 MED ORDER — DIAZEPAM 2 MG PO TABS
2.0000 mg | ORAL_TABLET | Freq: Once | ORAL | Status: AC
  Administered 2015-04-05: 2 mg via ORAL
  Filled 2015-04-05: qty 1

## 2015-04-05 MED ORDER — LACTATED RINGERS IV SOLN: 500.0000 mL | Freq: Once | INTRAVENOUS | Status: AC | PRN

## 2015-04-05 MED ORDER — CETYLPYRIDINIUM CHLORIDE 0.05 % MT LIQD
7.0000 mL | Freq: Four times a day (QID) | OROMUCOSAL | Status: DC
  Administered 2015-04-05 – 2015-04-07 (×7): 7 mL via OROMUCOSAL

## 2015-04-05 MED ORDER — LACTATED RINGERS IV SOLN
INTRAVENOUS | Status: DC | PRN
  Administered 2015-04-05 (×2): via INTRAVENOUS

## 2015-04-05 MED ORDER — SUCCINYLCHOLINE CHLORIDE 20 MG/ML IJ SOLN
INTRAMUSCULAR | Status: AC
  Filled 2015-04-05: qty 1

## 2015-04-05 MED ORDER — PROTAMINE SULFATE 10 MG/ML IV SOLN
INTRAVENOUS | Status: DC | PRN
Start: 2015-04-05 — End: 2015-04-05
  Administered 2015-04-05: 30 mg via INTRAVENOUS
  Administered 2015-04-05: 70 mg via INTRAVENOUS
  Administered 2015-04-05: 50 mg via INTRAVENOUS
  Administered 2015-04-05: 30 mg via INTRAVENOUS
  Administered 2015-04-05 (×2): 60 mg via INTRAVENOUS

## 2015-04-05 MED ORDER — ACETAMINOPHEN 500 MG PO TABS
1000.0000 mg | ORAL_TABLET | Freq: Four times a day (QID) | ORAL | Status: DC
  Administered 2015-04-05 – 2015-04-10 (×11): 1000 mg via ORAL
  Filled 2015-04-05 (×23): qty 2

## 2015-04-05 MED ORDER — STERILE WATER FOR INJECTION IJ SOLN
INTRAMUSCULAR | Status: AC
  Filled 2015-04-05: qty 10

## 2015-04-05 MED ORDER — PHENYLEPHRINE HCL 10 MG/ML IJ SOLN
0.0000 ug/min | INTRAVENOUS | Status: DC
  Administered 2015-04-05: 50 ug/min via INTRAVENOUS
  Filled 2015-04-05 (×2): qty 2

## 2015-04-05 MED ORDER — HEPARIN SODIUM (PORCINE) 1000 UNIT/ML IJ SOLN
INTRAMUSCULAR | Status: AC
  Filled 2015-04-05: qty 1

## 2015-04-05 MED ORDER — DOPAMINE-DEXTROSE 3.2-5 MG/ML-% IV SOLN: 3.0000 ug/kg/min | INTRAVENOUS | Status: DC

## 2015-04-05 SURGICAL SUPPLY — 79 items
BAG DECANTER FOR FLEXI CONT (MISCELLANEOUS) ×3 IMPLANT
BANDAGE ELASTIC 4 VELCRO ST LF (GAUZE/BANDAGES/DRESSINGS) ×3 IMPLANT
BANDAGE ELASTIC 6 VELCRO ST LF (GAUZE/BANDAGES/DRESSINGS) ×3 IMPLANT
BASKET HEART (ORDER IN 25'S) (MISCELLANEOUS) ×1
BASKET HEART (ORDER IN 25S) (MISCELLANEOUS) ×2 IMPLANT
BLADE STERNUM SYSTEM 6 (BLADE) ×3 IMPLANT
BNDG GAUZE ELAST 4 BULKY (GAUZE/BANDAGES/DRESSINGS) ×3 IMPLANT
CANISTER SUCTION 2500CC (MISCELLANEOUS) ×3 IMPLANT
CANNULA EZ GLIDE AORTIC 21FR (CANNULA) ×3 IMPLANT
CATH CPB KIT HENDRICKSON (MISCELLANEOUS) ×3 IMPLANT
CATH ROBINSON RED A/P 18FR (CATHETERS) ×3 IMPLANT
CATH THORACIC 36FR (CATHETERS) ×3 IMPLANT
CATH THORACIC 36FR RT ANG (CATHETERS) ×3 IMPLANT
CLIP TI MEDIUM 24 (CLIP) IMPLANT
CLIP TI WIDE RED SMALL 24 (CLIP) ×2 IMPLANT
CRADLE DONUT ADULT HEAD (MISCELLANEOUS) ×3 IMPLANT
DRAPE CARDIOVASCULAR INCISE (DRAPES) ×3
DRAPE SLUSH/WARMER DISC (DRAPES) ×3 IMPLANT
DRAPE SRG 135X102X78XABS (DRAPES) ×2 IMPLANT
DRSG COVADERM 4X14 (GAUZE/BANDAGES/DRESSINGS) ×3 IMPLANT
ELECT REM PT RETURN 9FT ADLT (ELECTROSURGICAL) ×6
ELECTRODE REM PT RTRN 9FT ADLT (ELECTROSURGICAL) ×4 IMPLANT
GAUZE SPONGE 4X4 12PLY STRL (GAUZE/BANDAGES/DRESSINGS) ×6 IMPLANT
GLOVE SURG SIGNA 7.5 PF LTX (GLOVE) ×9 IMPLANT
GOWN STRL REUS W/ TWL LRG LVL3 (GOWN DISPOSABLE) ×8 IMPLANT
GOWN STRL REUS W/ TWL XL LVL3 (GOWN DISPOSABLE) ×4 IMPLANT
GOWN STRL REUS W/TWL LRG LVL3 (GOWN DISPOSABLE) ×12
GOWN STRL REUS W/TWL XL LVL3 (GOWN DISPOSABLE) ×6
HEMOSTAT POWDER SURGIFOAM 1G (HEMOSTASIS) ×9 IMPLANT
HEMOSTAT SURGICEL 2X14 (HEMOSTASIS) ×3 IMPLANT
INSERT FOGARTY XLG (MISCELLANEOUS) IMPLANT
KIT BASIN OR (CUSTOM PROCEDURE TRAY) ×3 IMPLANT
KIT ROOM TURNOVER OR (KITS) ×3 IMPLANT
KIT SUCTION CATH 14FR (SUCTIONS) ×6 IMPLANT
KIT VASOVIEW W/TROCAR VH 2000 (KITS) ×3 IMPLANT
MARKER GRAFT CORONARY BYPASS (MISCELLANEOUS) ×9 IMPLANT
NS IRRIG 1000ML POUR BTL (IV SOLUTION) ×15 IMPLANT
PACK OPEN HEART (CUSTOM PROCEDURE TRAY) ×3 IMPLANT
PAD ARMBOARD 7.5X6 YLW CONV (MISCELLANEOUS) ×6 IMPLANT
PAD ELECT DEFIB RADIOL ZOLL (MISCELLANEOUS) ×3 IMPLANT
PENCIL BUTTON HOLSTER BLD 10FT (ELECTRODE) ×3 IMPLANT
PUNCH AORTIC ROT 4.0MM RCL 40 (MISCELLANEOUS) ×1 IMPLANT
PUNCH AORTIC ROTATE 4.0MM (MISCELLANEOUS) IMPLANT
PUNCH AORTIC ROTATE 4.5MM 8IN (MISCELLANEOUS) IMPLANT
PUNCH AORTIC ROTATE 5MM 8IN (MISCELLANEOUS) IMPLANT
SET CARDIOPLEGIA MPS 5001102 (MISCELLANEOUS) ×1 IMPLANT
SPONGE GAUZE 4X4 12PLY STER LF (GAUZE/BANDAGES/DRESSINGS) ×2 IMPLANT
SUT BONE WAX W31G (SUTURE) ×3 IMPLANT
SUT MNCRL AB 4-0 PS2 18 (SUTURE) IMPLANT
SUT PROLENE 3 0 SH DA (SUTURE) ×3 IMPLANT
SUT PROLENE 4 0 RB 1 (SUTURE) ×3
SUT PROLENE 4 0 SH DA (SUTURE) IMPLANT
SUT PROLENE 4-0 RB1 .5 CRCL 36 (SUTURE) IMPLANT
SUT PROLENE 6 0 C 1 30 (SUTURE) ×6 IMPLANT
SUT PROLENE 7 0 BV 1 (SUTURE) ×5 IMPLANT
SUT PROLENE 7 0 BV1 MDA (SUTURE) ×3 IMPLANT
SUT PROLENE 8 0 BV175 6 (SUTURE) ×2 IMPLANT
SUT STEEL 6MS V (SUTURE) ×3 IMPLANT
SUT STEEL STERNAL CCS#1 18IN (SUTURE) IMPLANT
SUT STEEL SZ 6 DBL 3X14 BALL (SUTURE) ×3 IMPLANT
SUT VIC AB 1 CTX 36 (SUTURE) ×6
SUT VIC AB 1 CTX36XBRD ANBCTR (SUTURE) ×4 IMPLANT
SUT VIC AB 2-0 CT1 27 (SUTURE) ×3
SUT VIC AB 2-0 CT1 TAPERPNT 27 (SUTURE) IMPLANT
SUT VIC AB 2-0 CTX 27 (SUTURE) IMPLANT
SUT VIC AB 3-0 SH 27 (SUTURE)
SUT VIC AB 3-0 SH 27X BRD (SUTURE) IMPLANT
SUT VIC AB 3-0 X1 27 (SUTURE) ×1 IMPLANT
SUT VICRYL 4-0 PS2 18IN ABS (SUTURE) IMPLANT
SUTURE E-PAK OPEN HEART (SUTURE) ×3 IMPLANT
SYSTEM SAHARA CHEST DRAIN ATS (WOUND CARE) ×3 IMPLANT
TAPE CLOTH SURG 4X10 WHT LF (GAUZE/BANDAGES/DRESSINGS) ×2 IMPLANT
TOWEL OR 17X24 6PK STRL BLUE (TOWEL DISPOSABLE) ×6 IMPLANT
TOWEL OR 17X26 10 PK STRL BLUE (TOWEL DISPOSABLE) ×6 IMPLANT
TRAY FOLEY IC TEMP SENS 16FR (CATHETERS) ×3 IMPLANT
TUBE FEEDING 8FR 16IN STR KANG (MISCELLANEOUS) ×3 IMPLANT
TUBING INSUFFLATION (TUBING) ×3 IMPLANT
UNDERPAD 30X30 INCONTINENT (UNDERPADS AND DIAPERS) ×3 IMPLANT
WATER STERILE IRR 1000ML POUR (IV SOLUTION) ×6 IMPLANT

## 2015-04-05 NOTE — Procedures (Signed)
Extubation Procedure Note  Patient Details:   Name: BRUK TUMOLO DOB: Sep 25, 1941 MRN: [id number]   Airway Documentation:     Evaluation  O2 sats: 95 Complications: No apparent complications Patient did tolerate procedure well. Bilateral Breath Sounds: Clear   Yes. Pt can follow commands. Deep oral sxn done. Pt has a positive cuff leak.  Per Dr Roxan Hockey ok to extubate. Extubated pt to 6L [place]. No stridor  Constance Holster 04/05/2015, 6:46 PM

## 2015-04-05 NOTE — Anesthesia Postprocedure Evaluation (Signed)
Anesthesia Post Note  Patient: Ryan [name]  Procedure(s) Performed: Procedure(s) (LRB): CORONARY ARTERY BYPASS GRAFTING (CABG)X4 LIMA-LAD; SVG-DIAG1-DIAG2; SVG-PD (N/A) TRANSESOPHAGEAL ECHOCARDIOGRAM (TEE) (N/A)  Anesthesia type: general  Patient location: PACU  Post pain: Pain level controlled  Post assessment: Patient's Cardiovascular Status Stable  Last Vitals:  Filed Vitals:   04/05/15 1330  BP:   Pulse: 85  Temp: 35.8 C  Resp: 12    Post vital signs: Reviewed and stable  Level of consciousness: sedated  Complications: No apparent anesthesia complications

## 2015-04-05 NOTE — Brief Op Note (Addendum)
04/01/2015 - 04/05/2015      [id number]       [place] [id number]             [phone]     04/01/2015 - 04/05/2015  11:17 AM  PATIENT:  [name]  74 y.o. male  PRE-OPERATIVE DIAGNOSIS:  SEVERE 3 VESSEL CAD  POST-OPERATIVE DIAGNOSIS:  SEVERE 3 VESSEL CAD  PROCEDURE:  Procedure(s): CORONARY ARTERY BYPASS GRAFTING (CABG)X4   LIMA to LAD  Sequential SVG to [place] and DIAG1B  SVG to PDA TRANSESOPHAGEAL ECHOCARDIOGRAM (TEE) EVH LEFT GREATER SAPHENOUS VEIN  SURGEON:  Surgeon(s): [name], MD  PHYSICIAN ASSISTANT: WAYNE GOLD PA-C  ANESTHESIA:   general  PATIENT CONDITION:  ICU - intubated and hemodynamically stable.  PRE-OPERATIVE WEIGHT: 11SR  COMPLICATIONS: NO KNOWN  EBL: SEE ANEST/PERFUSION RECORDS   XC= 75 min CPB= 126 min  Diffusely diseased target vessels. Good conduits

## 2015-04-05 NOTE — Interval H&P Note (Signed)
History and Physical Interval Note:  04/05/2015 7:25 AM  Ryan [name]  has presented today for surgery, with the diagnosis of SEVERE CAD  The various methods of treatment have been discussed with the patient and family. After consideration of risks, benefits and other options for treatment, the patient has consented to  Procedure(s): CORONARY ARTERY BYPASS GRAFTING (CABG) (N/A) TRANSESOPHAGEAL ECHOCARDIOGRAM (TEE) (N/A) as a surgical intervention .  The patient's history has been reviewed, patient examined, no change in status, stable for surgery.  I have reviewed the patient's chart and labs.  Questions were answered to the patient's satisfaction.     [name]

## 2015-04-05 NOTE — Progress Notes (Signed)
NIF -22, VC 550. Pt still sleepy but following commands

## 2015-04-05 NOTE — Anesthesia Preprocedure Evaluation (Addendum)
Anesthesia Evaluation  Patient identified by MRN, date of birth, ID band Patient awake    Reviewed: Allergy & Precautions, NPO status , Patient's Chart, lab work & pertinent test results  Airway Mallampati: II  TM Distance: >3 FB Neck ROM: Full    Dental  (+) Dental Advisory Given, Edentulous Upper   Pulmonary former smoker,    Pulmonary exam normal       Cardiovascular hypertension, Pt. on medications and Pt. on home beta blockers + CAD, + Past MI and + Peripheral Vascular Disease     Neuro/Psych    GI/Hepatic GERD-  Medicated,  Endo/Other  Hyperthyroidism   Renal/GU      Musculoskeletal  (+) Arthritis -,   Abdominal   Peds  Hematology   Anesthesia Other Findings   Reproductive/Obstetrics                           Anesthesia Physical Anesthesia Plan  ASA: IV  Anesthesia Plan: General   Post-op Pain Management:    Induction: Intravenous  Airway Management Planned: Oral ETT  Additional Equipment: Arterial line, CVP, PA Cath, 3D TEE and Ultrasound Guidance Line Placement  Intra-op Plan:   Post-operative Plan: Post-operative intubation/ventilation  Informed Consent: I have reviewed the patients History and Physical, chart, labs and discussed the procedure including the risks, benefits and alternatives for the proposed anesthesia with the patient or authorized representative who has indicated his/her understanding and acceptance.   Dental advisory given  Plan Discussed with: CRNA, Anesthesiologist and Surgeon  Anesthesia Plan Comments:         Anesthesia Quick Evaluation

## 2015-04-05 NOTE — Progress Notes (Signed)
Vent changes made per protocol

## 2015-04-05 NOTE — Progress Notes (Signed)
UR Completed.  [phone]  

## 2015-04-05 NOTE — Progress Notes (Signed)
      [address]       [place] [address]             [phone]      Just extubated  BP 115/59 mmHg  Pulse 95  Temp(Src) 99.7 F (37.6 C) (Core (Comment))  Resp 19  Ht 5\' 6"  (1.676 m)  Wt 179 lb (81.194 kg)  BMI 28.91 kg/m2  SpO2 99%  33/14 CI= 2.3  On dopamine at 3    Intake/Output Summary (Last 24 hours) at 04/05/15 1906 Last data filed at 04/05/15 1900  Gross per 24 hour  Intake 4876.89 ml  Output   4670 ml  Net 206.89 ml    Had short run of VT earlier- resolved spontaneously. Prolonged QT on ECG  Doing well early postop.  Revonda Standard Roxan Hockey, MD [facility] [phone]

## 2015-04-05 NOTE — H&P (View-Only) (Signed)
Reason for Consult:3 vessel CAD with unstable coronary syndrome Referring Physician: Dr. Joen Laura Ryan [name] is an 74 y.o. male.  HPI: 74 yo man with a history of CAD who presents with cc/o chest pain.  Mr. Mcadory is a 74 yo man with a past medical history significant for coronary artery disease, two prior MI, PAD, carotid artery occlusion, and a possible recent PE. He has been having CP and SOB for the past 5-6 weeks. He had a nuclear stress test in March which showed extensive scar in the inferior wall, mild LV dysfunction, but no reversible ischemia.  About 2 weeks ago he was admitted with CP and SOB. His cardiac enzymes were negative. A VQ scan showed intermediate probability of PE. He was started on Xarelto. Post discharge he continued to have pain.   On the day of readmission he had a severe episode of pain hat woke him up at 5 AM. It was a sharp pain radiating from the right anterior chest to below his right scapula. He did naot have nausea or diaphoresis. His ECG had non specific ST-T wave changes. His initial troponin was negative but subsequently he had a troponin of 0.06.  Today he had cardiac catheterization which revealed chronic occlusion of the RCA and the circumflex and progression of disease in the LAD.  Lower extremity duplex showed no evidence of DVT. A CT angiogram showed no evidence of PE.  Past Medical History  Diagnosis Date  . Arthritis   . Myocardial infarction     76    dr Brigitte Pulse PCP          . Hyperlipidemia     takes Crestor daily  . Peripheral vascular disease   . Carotid artery occlusion   . Hypertension     takes Amlodipine daily  . Chronic back pain   . History of gout     has colchicine prn  . History of colon polyps   . Urinary frequency   . Urinary urgency   . Enlarged prostate     takes Rapaflo daily  . History of kidney stones   . Hyperthyroidism     takes SYnthroid daily as instructed  . Cataract     Bil eyes/worse in left eye  . GERD  (gastroesophageal reflux disease)     occasional  . Pulmonary emboli 03/20/2015    elevated d-dimer, intermediate V/Q study, atypical chest pain and SOB. Start on Xarelto $RemoveBe'20mg'nhPmcXPvn$  BID for 3 month  . Renal insufficiency     Past Surgical History  Procedure Laterality Date  . Femoral artery - popliteal artery bypass graft    . Joint replacement      shoulder  . Back surgery      5 times  . Lumbar laminectomy/decompression microdiscectomy  02/12/2012    Procedure: LUMBAR LAMINECTOMY/DECOMPRESSION MICRODISCECTOMY;  Surgeon: Floyce Stakes, MD;  Location: [facility];  Service: Neurosurgery;  Laterality: N/A;  Lumbar four-five laminectomy  . Appendectomy    . Cataract surgery      left eye  . Colonoscopy    . Big toe surgery    . Cystoscopy    . Lumbar laminectomy  01/06/2013    Procedure: MICRODISCECTOMY LUMBAR LAMINECTOMY;  Surgeon: Marybelle Killings, MD;  Location: [facility];  Service: Orthopedics;  Laterality: N/A;  L3-4 decompression  . Cardiac catheterization      2010    dr Acie Fredrickson  . Eye surgery    . Total hip arthroplasty Left  01/09/2014    DR Ninfa Linden  . Total hip arthroplasty Left 01/09/2014    Procedure: LEFT TOTAL HIP ARTHROPLASTY ANTERIOR APPROACH and Steroid Injection Right hip;  Surgeon: Mcarthur Rossetti, MD;  Location: [facility];  Service: Orthopedics;  Laterality: Left;  . Steriod injection Right 01/09/2014    Procedure: STEROID INJECTION;  Surgeon: Mcarthur Rossetti, MD;  Location: [facility];  Service: Orthopedics;  Laterality: Right;    Family History  Problem Relation Age of Onset  . Heart disease Father   . Heart attack Father   . Heart disease Sister   . Hypertension Mother   . Diabetes Son     Social History:  reports that he quit smoking about 28 years ago. His smoking use included Cigarettes. He has quit using smokeless tobacco. His smokeless tobacco use included Chew. He reports that he does not drink alcohol or use illicit drugs.  Allergies:  Allergies   Allergen Reactions  . Codeine Nausea And Vomiting         Medications:  Scheduled: . amLODipine  5 mg Oral QHS  . aspirin EC  81 mg Oral Daily  . metoprolol tartrate  12.5 mg Oral BID  . rosuvastatin  20 mg Oral q1800  . sodium chloride  3 mL Intravenous Q12H  . tamsulosin  0.4 mg Oral QPC supper    Results for orders placed or performed during the hospital encounter of 04/01/15 (from the past 48 hour(s))  Troponin I-(serum)     Status: Abnormal   Collection Time: 04/02/15  1:10 AM  Result Value Ref Range   Troponin I 0.06 (H) <0.031 ng/mL    Comment:        PERSISTENTLY INCREASED TROPONIN VALUES IN THE RANGE OF 0.04-0.49 ng/mL CAN BE SEEN IN:       -UNSTABLE ANGINA       -CONGESTIVE HEART FAILURE       -MYOCARDITIS       -CHEST TRAUMA       -ARRYHTHMIAS       -LATE PRESENTING MYOCARDIAL INFARCTION       -COPD   CLINICAL FOLLOW-UP RECOMMENDED.   Basic metabolic panel     Status: Abnormal   Collection Time: 04/02/15  1:10 AM  Result Value Ref Range   Sodium 138 135 - 145 mmol/L   Potassium 3.9 3.5 - 5.1 mmol/L   Chloride 102 96 - 112 mmol/L   CO2 26 19 - 32 mmol/L   Glucose, Bld 106 (H) 70 - 99 mg/dL   BUN 16 6 - 23 mg/dL   Creatinine, Ser 1.18 0.50 - 1.35 mg/dL   Calcium 8.8 8.4 - 10.5 mg/dL   GFR calc non Af Amer 59 (L) >90 mL/min   GFR calc Af Amer 69 (L) >90 mL/min    Comment: (NOTE) The eGFR has been calculated using the CKD EPI equation. This calculation has not been validated in all clinical situations. eGFR's persistently <90 mL/min signify possible Chronic Kidney Disease.    Anion gap 10 5 - 15  APTT     Status: Abnormal   Collection Time: 04/02/15  1:10 AM  Result Value Ref Range   aPTT 38 (H) 24 - 37 seconds    Comment:        IF BASELINE aPTT IS ELEVATED, SUGGEST PATIENT RISK ASSESSMENT BE USED TO DETERMINE APPROPRIATE ANTICOAGULANT THERAPY.   Heparin level (unfractionated)     Status: Abnormal   Collection Time: 04/02/15  1:10 AM  Result  Value Ref Range   Heparin Unfractionated >2.20 (H) 0.30 - 0.70 IU/mL    Comment: RESULTS CONFIRMED BY MANUAL DILUTION        IF HEPARIN RESULTS ARE BELOW EXPECTED VALUES, AND PATIENT DOSAGE HAS BEEN CONFIRMED, SUGGEST FOLLOW UP TESTING OF ANTITHROMBIN III LEVELS.   CBC     Status: None   Collection Time: 04/02/15  1:10 AM  Result Value Ref Range   WBC 9.2 4.0 - 10.5 K/uL   RBC 4.25 4.22 - 5.81 MIL/uL   Hemoglobin 13.4 13.0 - 17.0 g/dL   HCT 40.1 39.0 - 52.0 %   MCV 94.4 78.0 - 100.0 fL   MCH 31.5 26.0 - 34.0 pg   MCHC 33.4 30.0 - 36.0 g/dL   RDW 12.9 11.5 - 15.5 %   Platelets 252 150 - 400 K/uL  Troponin I     Status: Abnormal   Collection Time: 04/02/15  8:35 AM  Result Value Ref Range   Troponin I 0.04 (H) <0.031 ng/mL    Comment:        PERSISTENTLY INCREASED TROPONIN VALUES IN THE RANGE OF 0.04-0.49 ng/mL CAN BE SEEN IN:       -UNSTABLE ANGINA       -CONGESTIVE HEART FAILURE       -MYOCARDITIS       -CHEST TRAUMA       -ARRYHTHMIAS       -LATE PRESENTING MYOCARDIAL INFARCTION       -COPD   CLINICAL FOLLOW-UP RECOMMENDED.   APTT     Status: Abnormal   Collection Time: 04/02/15 12:51 PM  Result Value Ref Range   aPTT 68 (H) 24 - 37 seconds    Comment:        IF BASELINE aPTT IS ELEVATED, SUGGEST PATIENT RISK ASSESSMENT BE USED TO DETERMINE APPROPRIATE ANTICOAGULANT THERAPY.   APTT     Status: Abnormal   Collection Time: 04/02/15  9:58 PM  Result Value Ref Range   aPTT 78 (H) 24 - 37 seconds    Comment:        IF BASELINE aPTT IS ELEVATED, SUGGEST PATIENT RISK ASSESSMENT BE USED TO DETERMINE APPROPRIATE ANTICOAGULANT THERAPY.   CBC     Status: None   Collection Time: 04/03/15  3:20 AM  Result Value Ref Range   WBC 10.3 4.0 - 10.5 K/uL   RBC 4.29 4.22 - 5.81 MIL/uL   Hemoglobin 13.4 13.0 - 17.0 g/dL   HCT 39.8 39.0 - 52.0 %   MCV 92.8 78.0 - 100.0 fL   MCH 31.2 26.0 - 34.0 pg   MCHC 33.7 30.0 - 36.0 g/dL   RDW 12.9 11.5 - 15.5 %   Platelets 241  150 - 400 K/uL  APTT     Status: Abnormal   Collection Time: 04/03/15  3:20 AM  Result Value Ref Range   aPTT 91 (H) 24 - 37 seconds    Comment:        IF BASELINE aPTT IS ELEVATED, SUGGEST PATIENT RISK ASSESSMENT BE USED TO DETERMINE APPROPRIATE ANTICOAGULANT THERAPY.     No results found.  Review of Systems  Constitutional: Positive for malaise/fatigue. Negative for fever and chills.  Respiratory: Positive for shortness of breath.   Cardiovascular: Positive for chest pain.  Musculoskeletal: Positive for myalgias and joint pain.  All other systems reviewed and are negative.  Blood pressure 144/55, pulse 81, temperature 97.8 F (36.6 C), temperature source Oral, resp. rate 18, height $RemoveBe'5\' 6"'vTrSJlLoK$  (1.676 m),  weight 177 lb 8 oz (80.513 kg), SpO2 97 %. Physical Exam  Vitals reviewed. Constitutional: He is oriented to person, place, and time. He appears well-developed and well-nourished. No distress.  HENT:  Head: Normocephalic and atraumatic.  Eyes: EOM are normal. Pupils are equal, round, and reactive to light.  Neck: Neck supple. No thyromegaly present.  Right carotid bruit  Cardiovascular: Normal rate, regular rhythm, normal heart sounds and intact distal pulses.   No murmur heard. Respiratory: Effort normal and breath sounds normal. He has no wheezes. He has no rales.  GI: Soft. There is no tenderness.  Well healed midline scar  Musculoskeletal: He exhibits no edema.  Neurological: He is alert and oriented to person, place, and time. No cranial nerve deficit.  No focal motor deficit     CT ANGIOGRAPHY CHEST WITH CONTRAST  TECHNIQUE: Multidetector CT imaging of the chest was performed using the standard protocol during bolus administration of intravenous contrast. Multiplanar CT image reconstructions and MIPs were obtained to evaluate the vascular anatomy.  CONTRAST: 67m OMNIPAQUE IOHEXOL 350 MG/ML SOLN  COMPARISON: Radiographs of April 01, 2015.  FINDINGS: No  pneumothorax or pleural effusion is noted. No acute pulmonary disease is noted. There is no evidence of pulmonary embolus. There is no evidence of thoracic aortic dissection or aneurysm. Great vessels are widely patent. Coronary artery calcifications are noted. Visualized portion of upper abdomen appears normal. Status post left shoulder arthroplasty.  Review of the MIP images confirms the above findings.  IMPRESSION: No evidence of pulmonary embolus. No acute abnormality seen within the chest.  CARDIAC CATHETERIZATION IMPRESSION:  Preserved global LV function with near mid cavity obliteration with moderate left ventricular hypertrophy and focal posterior hypocontractility with a global ejection fraction of 60%.  Significant coronary calcification with three-vessel coronary obstructive disease with 40-50% smooth proximal LAD stenosis, 90% ostial first diagonal stenosis of a small diagonal vessel, 50% mid second diagonal stenosis with 90% stenosis in a superior branch, and segmental 80%, 95%, and 80% LAD stenoses in the mid and mid-distal LAD; 70-80% AV groove circumflex stenosis after the OM1 takeoff with total occlusion of the AV groove circumflex after the own 2 vessel with collateralization; and total proximal RCA occlusion with faint antegrade collaterals and extensive left-to-right collaterals.  Assessment/Plan: 74yo man with multiple cardiac risk factors and a history of CAD who presents with a 5-6 week history of unstable progressive chest pain. He has severe 3 vessel CAD. CABG is indicated for survival benefit and relief of symptoms.  I suspect his CP has been cardiac in origin all along. He has no evidence of DVT and his CT angio did not show a OE. His V/Q was only intermediate probability. It is possible he had a small PE that resolved with anticoagulation. In any event, I do not think the questionable diagnosis of a PE should delay his CABG.  I discussed the general nature of  the procedure, the need for general anesthesia, the use of cardiopulmonary bypass, and incisions to be used with Mr and Mrs KModesto We discussed the expected hospital stay, overall recovery and short and long term outcomes. I reviewed the indications, risks benefits and alternatives. They understand the risks include, but are not limited to death, stroke, MI, DVT/PE, bleeding, possible need for transfusion, infections, cardiac arrhythmias, and other organ system dysfunction including respiratory, renal, or GI complications. He accepts the risks and agrees to proceed.  Plan CABG Friday 04/05/15  SMelrose Nakayama4/13/2016, 10:06 PM

## 2015-04-05 NOTE — Transfer of Care (Signed)
Immediate Anesthesia Transfer of Care Note  Patient: SHAQUELLE HERNON  Procedure(s) Performed: Procedure(s): CORONARY ARTERY BYPASS GRAFTING (CABG)X4 LIMA-LAD; SVG-DIAG1-DIAG2; SVG-PD (N/A) TRANSESOPHAGEAL ECHOCARDIOGRAM (TEE) (N/A)  Patient Location: SICU  Anesthesia Type:General  Level of Consciousness: sedated, unresponsive and Patient remains intubated per anesthesia plan  Airway & Oxygen Therapy: Patient remains intubated per anesthesia plan and Patient placed on Ventilator (see vital sign flow sheet for setting)  Post-op Assessment: Report given to RN and Post -op Vital signs reviewed and stable  Post vital signs: Reviewed and stable  Last Vitals:  Filed Vitals:   04/05/15 0725  BP:   Pulse:   Temp:   Resp: 11    Complications: No apparent anesthesia complications

## 2015-04-05 NOTE — Anesthesia Procedure Notes (Addendum)
Procedure Name: Intubation Date/Time: 04/05/2015 7:50 AM Performed by: Garrison Columbus T Pre-anesthesia Checklist: Patient identified, Emergency Drugs available, Suction available and Patient being monitored Patient Re-evaluated:Patient Re-evaluated prior to inductionOxygen Delivery Method: Circle system utilized Preoxygenation: Pre-oxygenation with 100% oxygen Intubation Type: IV induction Ventilation: Mask ventilation without difficulty and Oral airway inserted - appropriate to patient size Laryngoscope Size: Sabra Heck and 2 Grade View: Grade I Tube type: Oral Tube size: 8.0 mm Number of attempts: 1 Airway Equipment and Method: Stylet and Oral airway Placement Confirmation: ETT inserted through vocal cords under direct vision,  positive ETCO2 and breath sounds checked- equal and bilateral Secured at: 23 cm Tube secured with: Tape Dental Injury: Teeth and Oropharynx as per pre-operative assessment

## 2015-04-05 NOTE — Progress Notes (Signed)
  Echocardiogram Echocardiogram Transesophageal has been performed.  Ryan [name] 04/05/2015, 8:35 AM

## 2015-04-06 ENCOUNTER — Inpatient Hospital Stay (HOSPITAL_COMMUNITY): Payer: Medicare Other

## 2015-04-06 LAB — GLUCOSE, CAPILLARY
GLUCOSE-CAPILLARY: 102 mg/dL — AB (ref 70–99)
GLUCOSE-CAPILLARY: 102 mg/dL — AB (ref 70–99)
GLUCOSE-CAPILLARY: 104 mg/dL — AB (ref 70–99)
GLUCOSE-CAPILLARY: 108 mg/dL — AB (ref 70–99)
GLUCOSE-CAPILLARY: 109 mg/dL — AB (ref 70–99)
GLUCOSE-CAPILLARY: 113 mg/dL — AB (ref 70–99)
GLUCOSE-CAPILLARY: 115 mg/dL — AB (ref 70–99)
GLUCOSE-CAPILLARY: 124 mg/dL — AB (ref 70–99)
GLUCOSE-CAPILLARY: 142 mg/dL — AB (ref 70–99)
GLUCOSE-CAPILLARY: 153 mg/dL — AB (ref 70–99)
Glucose-Capillary: 101 mg/dL — ABNORMAL HIGH (ref 70–99)
Glucose-Capillary: 107 mg/dL — ABNORMAL HIGH (ref 70–99)
Glucose-Capillary: 97 mg/dL (ref 70–99)
Glucose-Capillary: 89 mg/dL (ref 70–99)
Glucose-Capillary: 109 mg/dL — ABNORMAL HIGH (ref 70–99)

## 2015-04-06 LAB — CBC
HCT: 32.6 % — ABNORMAL LOW (ref 39.0–52.0)
HEMATOCRIT: 33.3 % — AB (ref 39.0–52.0)
HEMOGLOBIN: 11.1 g/dL — AB (ref 13.0–17.0)
Hemoglobin: 11.3 g/dL — ABNORMAL LOW (ref 13.0–17.0)
MCH: 31.4 pg (ref 26.0–34.0)
MCH: 31.7 pg (ref 26.0–34.0)
MCHC: 34 g/dL (ref 30.0–36.0)
MCHC: 33.9 g/dL (ref 30.0–36.0)
MCV: 92.1 fL (ref 78.0–100.0)
MCV: 93.5 fL (ref 78.0–100.0)
PLATELETS: 161 10*3/uL (ref 150–400)
Platelets: 163 10*3/uL (ref 150–400)
RBC: 3.54 MIL/uL — AB (ref 4.22–5.81)
RBC: 3.56 MIL/uL — AB (ref 4.22–5.81)
RDW: 12.5 % (ref 11.5–15.5)
RDW: 12.9 % (ref 11.5–15.5)
WBC: 17.7 10*3/uL — ABNORMAL HIGH (ref 4.0–10.5)
WBC: 16.6 10*3/uL — ABNORMAL HIGH (ref 4.0–10.5)

## 2015-04-06 LAB — CREATININE, SERUM
Creatinine, Ser: 1.14 mg/dL (ref 0.50–1.35)
GFR calc non Af Amer: 62 mL/min — ABNORMAL LOW (ref >90)
GFR, EST AFRICAN AMERICAN: 72 mL/min — AB (ref >90)

## 2015-04-06 LAB — BASIC METABOLIC PANEL
ANION GAP: 11 (ref 5–15)
BUN: 8 mg/dL (ref 6–23)
CO2: 21 mmol/L (ref 19–32)
Calcium: 7.6 mg/dL — ABNORMAL LOW (ref 8.4–10.5)
Chloride: 102 mmol/L (ref 96–112)
Creatinine, Ser: 0.94 mg/dL (ref 0.50–1.35)
GFR calc non Af Amer: 81 mL/min — ABNORMAL LOW (ref >90)
Glucose, Bld: 112 mg/dL — ABNORMAL HIGH (ref 70–99)
Potassium: 3.7 mmol/L (ref 3.5–5.1)
Sodium: 134 mmol/L — ABNORMAL LOW (ref 135–145)

## 2015-04-06 LAB — POCT I-STAT, CHEM 8
BUN: 14 mg/dL (ref 6–23)
CREATININE: 1.1 mg/dL (ref 0.50–1.35)
Calcium, Ion: 1.15 mmol/L (ref 1.13–1.30)
Chloride: 101 mmol/L (ref 96–112)
Glucose, Bld: 130 mg/dL — ABNORMAL HIGH (ref 70–99)
HCT: 32 % — ABNORMAL LOW (ref 39.0–52.0)
Hemoglobin: 10.9 g/dL — ABNORMAL LOW (ref 13.0–17.0)
POTASSIUM: 4.3 mmol/L (ref 3.5–5.1)
Sodium: 136 mmol/L (ref 135–145)
TCO2: 21 mmol/L (ref 0–100)

## 2015-04-06 LAB — MAGNESIUM
MAGNESIUM: 2.4 mg/dL (ref 1.5–2.5)
MAGNESIUM: 2.4 mg/dL (ref 1.5–2.5)

## 2015-04-06 MED ORDER — POTASSIUM CHLORIDE 10 MEQ/50ML IV SOLN
10.0000 meq | Freq: Once | INTRAVENOUS | Status: AC
  Administered 2015-04-06: 10 meq via INTRAVENOUS

## 2015-04-06 MED ORDER — INSULIN DETEMIR 100 UNIT/ML ~~LOC~~ SOLN
20.0000 [IU] | Freq: Once | SUBCUTANEOUS | Status: AC
  Administered 2015-04-06: 20 [IU] via SUBCUTANEOUS
  Filled 2015-04-06 (×2): qty 0.2

## 2015-04-06 MED ORDER — POTASSIUM CHLORIDE 10 MEQ/50ML IV SOLN
10.0000 meq | INTRAVENOUS | Status: AC
  Administered 2015-04-06 (×2): 10 meq via INTRAVENOUS

## 2015-04-06 MED ORDER — ENOXAPARIN SODIUM 40 MG/0.4ML ~~LOC~~ SOLN
40.0000 mg | Freq: Every day | SUBCUTANEOUS | Status: DC
  Administered 2015-04-06: 40 mg via SUBCUTANEOUS
  Filled 2015-04-06 (×2): qty 0.4

## 2015-04-06 MED ORDER — INSULIN DETEMIR 100 UNIT/ML ~~LOC~~ SOLN
20.0000 [IU] | Freq: Every day | SUBCUTANEOUS | Status: DC
  Administered 2015-04-07: 20 [IU] via SUBCUTANEOUS
  Filled 2015-04-06: qty 0.2

## 2015-04-06 MED ORDER — INSULIN ASPART 100 UNIT/ML ~~LOC~~ SOLN
0.0000 [IU] | SUBCUTANEOUS | Status: DC
  Administered 2015-04-06 – 2015-04-07 (×3): 2 [IU] via SUBCUTANEOUS

## 2015-04-06 MED ORDER — INSULIN ASPART 100 UNIT/ML ~~LOC~~ SOLN: 0.0000 [IU] | SUBCUTANEOUS | Status: DC

## 2015-04-06 NOTE — Progress Notes (Signed)
      [address]       [place] [address]             [phone]      Sleeping  BP 131/105 mmHg  Pulse 93  Temp(Src) 97.8 F (36.6 C) (Oral)  Resp 19  Ht 5\' 6"  (1.676 m)  Wt 185 lb (83.915 kg)  BMI 29.87 kg/m2  SpO2 92%   Intake/Output Summary (Last 24 hours) at 04/06/15 1921 Last data filed at 04/06/15 1800  Gross per 24 hour  Intake 2233.69 ml  Output   1805 ml  Net 428.69 ml   K= 4.3, creatinine 1.1, Hct 32 CBG well controlled  Doing well POD # 1  Nicolette Gieske C. Roxan Hockey, MD [facility] [phone]

## 2015-04-06 NOTE — Op Note (Signed)
Ryan [name], Ryan [name] NO.:  [id number]  MEDICAL RECORD NO.:  [id number]  LOCATION:  2S11C                        FACILITY:  [facility]  PHYSICIAN:  Revonda Standard. Roxan Hockey, M.D.DATE OF BIRTH:  14-Aug-1941  DATE OF PROCEDURE:  04/05/2015 DATE OF DISCHARGE:                              OPERATIVE REPORT   PREOPERATIVE DIAGNOSIS:  Severe three-vessel coronary disease.  POSTOPERATIVE DIAGNOSIS:  Severe three-vessel coronary disease.  PROCEDURE:  Median sternotomy, extracorporeal circulation Coronary artery bypass grafting x4   Left internal mammary artery to left anterior descending  Sequential saphenous vein Y-graft to diagonal 1 A and B  Saphenous vein graft to posterior descending Endoscopic vein harvest left thigh.  SURGEON:  Revonda Standard. Roxan Hockey, M.D.  ASSISTANT:  Ryan Giovanni, PA-C  ANESTHESIA:  General.  FINDINGS:  Transesophageal echocardiography showed trace mitral regurgitation.  There was a mildly impaired left ventricular function. Plaque palpable in ascending aorta near the sino-tubular junction and also in the distal ascending aorta near the takeoff the innominate artery.  Coronary arteries diffusely diseased. LAD and PDA fair targets. Diagonal branches poor targets. Good quality conduits.  INDICATIONS:  [name] is a 74 year old gentleman with a past medical history significant for atherosclerotic cardiovascular disease with previous peripheral bypass, 2 prior MIs, and carotid artery disease.  He also has known coronary artery disease.  He presented for the second time within a week or 2 with chest pain.  He had previously been diagnosed with a pulmonary embolus on the basis of an intermediate probability V/Q scan.  Despite that, he had ongoing pain and presented back after an episode of awaking at five in the morning.  His troponin was mildly elevated and EKG showed nonspecific ST changes.  He underwent cardiac catheterization where he was  found to have severe 3-vessel coronary disease.  He had known occlusions of his distal left circumflex and his right coronary that had been present for years.  He now had progression of disease in the LAD.  He was advised to undergo coronary artery bypass grafting.  The indications, risks, benefits, and alternatives were discussed in detail with the patient.  He understood and accepted the risks and agreed to proceed.  OPERATIVE NOTE:  [name] was brought to the preoperative holding area on April 05, 2015. Anesthesia placed a Swan-Ganz catheter and an arterial blood pressure monitoring line.  He was taken to the operating room, anesthetized, and intubated.  Intravenous antibiotics were administered.  A Foley catheter was placed.  Transesophageal echocardiography was performed.  It revealed mild global left ventricular hypokinesis.  There was trace mitral regurgitation.  There were no other valvular abnormalities noted.  The chest, abdomen, and legs were prepped and draped in usual sterile fashion.  An incision was made in the right leg at the level of the knee.  Greater saphenous vein was harvested from the right thigh endoscopically.  The vein bifurcated mid thigh and both limbs of the bifurcation were dissected out.  This was later used as a Y graft.  Simultaneously with the vein harvest, a median sternotomy was performed and the left internal mammary artery was harvested using standard technique.  2000 units of heparin was administered  during the vessel harvest.  The remainder of the full heparin dose was given after harvesting the conduits.  The left mammary was a good quality vessel that had excellent flow when divided distally.  After harvesting the conduits, the pericardium was opened.  The remainder of the full heparin dose was given.  After confirming adequate anticoagulation with ACT measurement, the aorta was cannulated via concentric 2-0 Ethibond pledgeted pursestring  sutures.  There was some palpable plaque along the right side of the aorta near the takeoff the innominate, this did not interfere with cannulation and care was taken to avoid this area with clamping.  There also was significant calcification of the aorta near the sino-tubular junction.  This did not interfere with placement of the proximals.  A dual stage venous cannula was placed via pursestring suture in the right atrial appendage. Cardiopulmonary bypass was begun.  Flows were maintained per protocol. The patient was cooled to 32 degrees Celsius.  The coronary arteries were inspected and anastomotic sites were chosen.  The conduits were inspected and cut to length.  It should be noted that the coronaries were diffusely diseased vessels.  There was a distal obtuse marginal vessel that was not graftable.  The conduits were inspected and cut to length.  A foam pad was placed in the pericardium to insulate the heart. Temperature probe was placed in myocardial septum and a cardioplegia cannula was placed in the ascending aorta.  The aorta was crossclamped.  The left ventricle was emptied via the aortic root vent.  Cardiac arrest then was achieved with a combination of cold antegrade blood cardioplegia and topical iced saline.  1.5 L of cardioplegia was administered.  There was a rapid diastolic arrest and septal cooling to 11 degrees Celsius.  A reversed saphenous vein graft was placed end-to-side to the posterior descending branch of the right coronary.  This vessel arose at the acute margin and came over the acute margin and then traversed transversely along the right ventricular wall.  It was a 1.5 mm vessel at the site of the anastomosis and was diffusely diseased, but it was better quality than expected based on catheterization.  An end-to-side anastomosis was performed with a running 7-0 Prolene suture.  A probe passed easily distally and proximally at the completion of the  anastomosis. Cardioplegia was administered.  There was good flow and good hemostasis.  Next, the diagonal branch to the LAD was inspected.  This vessel bifurcated and there were 2 smaller branches.  The branch closer to the LAD was slightly larger than the branch more lateral.  Both were thought to be suitable for grafting, although small and poor quality vessels.  There was a natural Y in the vein that was used with a larger limb bypassing the more medial branch and the smaller limb bypassing the more lateral branch.  These were both well-sized match to the target vessels.  The vein was of good quality.  Both anastomoses were performed with running 7-0 Prolene sutures in an end-to-side fashion.  A 1 mm probe did pass distally from each anastomosis at its completion.  With cardioplegia administration, there was good flow and good hemostasis.  Additional cardioplegia was administered down the aortic root.  The left internal mammary artery then was brought through a window in the pericardium and the distal end was beveled.  It was anastomosed end- to-side to the distal LAD.  The LAD was a diffusely diseased poor quality target.  There was a tight stenosis  just proximal to the site of the anastomosis.  Distally, there was diffuse plaquing, but a 1.5 mm probe did pass to the apex.  The mammary was a good quality conduit.  It was anastomosed end-to-side with a running 8-0 Prolene suture.  At the completion of the mammary to LAD anastomosis, the bulldog clamp was briefly removed to inspect for hemostasis.  Rapid septal rewarming was noted.  The bulldog clamp was replaced. The mammary pedicle was tacked to the epicardial surface of the heart with 6-0 Prolene sutures.  Additional cardioplegia was administered.  The vein grafts were cut to length.  The proximal vein graft anastomoses then were performed to 4.0 mm punch aortotomies with running 6-0 Prolene sutures.  At the completion of the  final proximal anastomosis, the patient was placed in Trendelenburg position.  Lidocaine was administered.  The aortic root was de-aired.  The bulldog clamp was again removed from the left mammary artery and the aortic crossclamp was removed.  The total crossclamp time was 75 minutes.  The patient required a single defibrillation with 10 joules and then was in sinus rhythm thereafter.  While rewarming was completed, all proximal and distal anastomoses were inspected for hemostasis.  Epicardial pacing wires were placed on the right ventricle and right atrium.  When the patient had rewarmed to a core temperature of 37 degrees Celsius, a low-dose dopamine infusion was initiated at 3 mcg/kg/minute.  He was weaned from cardiopulmonary bypass on the first attempt without difficulty.  The total bypass time was 126 minutes.  The initial cardiac index was 1.5 L/minute m2, but with volume administration and time, the index improved to greater than 2 L/minute/m2. He remained stable thereafter.  A test dose of protamine was administered and was well tolerated.  The atrial and aortic cannulae were removed.  The remainder of the protamine was administered without incident.  The chest was irrigated with warm saline.  Hemostasis was achieved.  The pericardium was reapproximated with interrupted 3-0 silk sutures.  It came together easily without kinking the underlying grafts. Left pleural and mediastinal chest tubes were placed through separate subcostal incisions.  The sternum was closed with a combination of single and double heavy gauge stainless steel wires.  The pectoralis fascia, subcutaneous tissue, and skin were closed in standard fashion. All sponge, needle, and instrument counts were correct at the end of the procedure.  The patient was taken from the operating room to the surgical intensive care unit in good condition.     Revonda Standard Roxan Hockey, M.D.     SCH/MEDQ  D:  04/05/2015  T:   04/06/2015  Job:  [id number]

## 2015-04-06 NOTE — Progress Notes (Signed)
1 Day Post-Op Procedure(s) (LRB): CORONARY ARTERY BYPASS GRAFTING (CABG)X4 LIMA-LAD; SVG-DIAG1-DIAG2; SVG-PD (N/A) TRANSESOPHAGEAL ECHOCARDIOGRAM (TEE) (N/A) Subjective: Some incisional pain  Objective: Vital signs in last 24 hours: Temp:  [96.4 F (35.8 C)-99.9 F (37.7 C)] 99 F (37.2 C) (04/16 0900) Pulse Rate:  [48-105] 87 (04/16 0900) Cardiac Rhythm:  [-] Normal sinus rhythm (04/16 0900) Resp:  [12-28] 15 (04/16 0900) BP: (95-132)/(32-69) 123/57 mmHg (04/16 0900) SpO2:  [91 %-100 %] 96 % (04/16 0900) Arterial Line BP: (95-152)/(44-64) 111/46 mmHg (04/16 0900) FiO2 (%):  [40 %-50 %] 40 % (04/15 1702) Weight:  [185 lb (83.915 kg)] 185 lb (83.915 kg) (04/16 0500)  Hemodynamic parameters for last 24 hours: PAP: (21-49)/(10-29) 26/15 mmHg CO:  [4.1 L/min-6 L/min] 5 L/min CI:  [2.2 L/min/m2-3.2 L/min/m2] 2.6 L/min/m2  Intake/Output from previous day: 04/15 0701 - 04/16 0700 In: 6402.1 [I.V.:4892.1; Blood:430; NG/GT:30; IV Piggyback:1050] Out: 6314 [Urine:4045; Emesis/NG output:25; Blood:1570; Chest Tube:320] Intake/Output this shift: Total I/O In: 191.4 [I.V.:141.4; IV Piggyback:50] Out: 30 [Urine:30]  General appearance: alert and no distress Neurologic: intact Heart: regular rate and rhythm Lungs: diminished breath sounds bibasilar Abdomen: normal findings: soft, non-tender  Lab Results:  Recent Labs  04/05/15 1730 04/05/15 1734 04/06/15 0420  WBC 20.9*  --  17.7*  HGB 11.9* 11.9* 11.1*  HCT 34.5* 35.0* 32.6*  PLT 177  --  163   BMET:  Recent Labs  04/05/15 1734 04/06/15 0420  NA 138 134*  K 4.0 3.7  CL 105 102  CO2  --  21  GLUCOSE 121* 112*  BUN 9 8  CREATININE 0.90 0.94  CALCIUM  --  7.6*    PT/INR:  Recent Labs  04/05/15 1315  LABPROT 16.9*  INR 1.36   ABG    Component Value Date/Time   PHART 7.330* 04/05/2015 2032   HCO3 20.7 04/05/2015 2032   TCO2 22 04/05/2015 2032   ACIDBASEDEF 5.0* 04/05/2015 2032   O2SAT 98.0 04/05/2015 2032    CBG (last 3)   Recent Labs  04/06/15 0602 04/06/15 0655 04/06/15 0806  GLUCAP 101* 108* 102*    Assessment/Plan: S/P Procedure(s) (LRB): CORONARY ARTERY BYPASS GRAFTING (CABG)X4 LIMA-LAD; SVG-DIAG1-DIAG2; SVG-PD (N/A) TRANSESOPHAGEAL ECHOCARDIOGRAM (TEE) (N/A) POD # 1  CV- stable- dc swan, wean dopamine  ASA, beta blocker, statin  RESP- pulmonary hygiene  RENAL- lytes and creatinine OK  ENDO_ now off insulin drip, transition to levemir + SSI  Anemia secondary to ABL- mild, follow  SCD + enoxaparin for DVT prophylaxis  DC chest tubes  mobilize   LOS: 4 days    Ryan [name] 04/06/2015

## 2015-04-07 ENCOUNTER — Inpatient Hospital Stay (HOSPITAL_COMMUNITY): Payer: Medicare Other

## 2015-04-07 LAB — CBC
HEMATOCRIT: 33.1 % — AB (ref 39.0–52.0)
Hemoglobin: 10.9 g/dL — ABNORMAL LOW (ref 13.0–17.0)
MCH: 31.4 pg (ref 26.0–34.0)
MCHC: 32.9 g/dL (ref 30.0–36.0)
MCV: 95.4 fL (ref 78.0–100.0)
PLATELETS: 160 10*3/uL (ref 150–400)
RBC: 3.47 MIL/uL — ABNORMAL LOW (ref 4.22–5.81)
RDW: 13.2 % (ref 11.5–15.5)
WBC: 16.8 10*3/uL — ABNORMAL HIGH (ref 4.0–10.5)

## 2015-04-07 LAB — GLUCOSE, CAPILLARY
GLUCOSE-CAPILLARY: 109 mg/dL — AB (ref 70–99)
GLUCOSE-CAPILLARY: 119 mg/dL — AB (ref 70–99)
GLUCOSE-CAPILLARY: 127 mg/dL — AB (ref 70–99)
GLUCOSE-CAPILLARY: 100 mg/dL — AB (ref 70–99)
Glucose-Capillary: 109 mg/dL — ABNORMAL HIGH (ref 70–99)
Glucose-Capillary: 126 mg/dL — ABNORMAL HIGH (ref 70–99)
Glucose-Capillary: 98 mg/dL (ref 70–99)

## 2015-04-07 LAB — BASIC METABOLIC PANEL
Anion gap: 9 (ref 5–15)
BUN: 16 mg/dL (ref 6–23)
CALCIUM: 8 mg/dL — AB (ref 8.4–10.5)
CO2: 24 mmol/L (ref 19–32)
Chloride: 102 mmol/L (ref 96–112)
Creatinine, Ser: 1.1 mg/dL (ref 0.50–1.35)
GFR calc Af Amer: 75 mL/min — ABNORMAL LOW (ref >90)
GFR calc non Af Amer: 65 mL/min — ABNORMAL LOW (ref >90)
Glucose, Bld: 119 mg/dL — ABNORMAL HIGH (ref 70–99)
Potassium: 4.2 mmol/L (ref 3.5–5.1)
Sodium: 135 mmol/L (ref 135–145)

## 2015-04-07 MED ORDER — RIVAROXABAN 20 MG PO TABS
20.0000 mg | ORAL_TABLET | Freq: Every day | ORAL | Status: DC
Start: 2015-04-07 — End: 2015-04-07

## 2015-04-07 MED ORDER — ALUM & MAG HYDROXIDE-SIMETH 200-200-20 MG/5ML PO SUSP: 15.0000 mL | ORAL | Status: DC | PRN

## 2015-04-07 MED ORDER — INSULIN ASPART 100 UNIT/ML ~~LOC~~ SOLN: 0.0000 [IU] | Freq: Three times a day (TID) | SUBCUTANEOUS | Status: DC

## 2015-04-07 MED ORDER — SODIUM CHLORIDE 0.9 % IJ SOLN
3.0000 mL | INTRAMUSCULAR | Status: DC | PRN
  Administered 2015-04-10: 3 mL via INTRAVENOUS
  Filled 2015-04-07: qty 3

## 2015-04-07 MED ORDER — ENOXAPARIN SODIUM 40 MG/0.4ML ~~LOC~~ SOLN
40.0000 mg | SUBCUTANEOUS | Status: AC
  Administered 2015-04-07: 40 mg via SUBCUTANEOUS
  Filled 2015-04-07: qty 0.4

## 2015-04-07 MED ORDER — SODIUM CHLORIDE 0.9 % IJ SOLN
3.0000 mL | Freq: Two times a day (BID) | INTRAMUSCULAR | Status: DC
  Administered 2015-04-07 – 2015-04-10 (×4): 3 mL via INTRAVENOUS

## 2015-04-07 MED ORDER — MOVING RIGHT ALONG BOOK
Freq: Once | Status: AC
  Administered 2015-04-07: 18:00:00
  Filled 2015-04-07: qty 1

## 2015-04-07 MED ORDER — TAMSULOSIN HCL 0.4 MG PO CAPS
0.4000 mg | ORAL_CAPSULE | Freq: Every day | ORAL | Status: DC
  Administered 2015-04-07 – 2015-04-09 (×3): 0.4 mg via ORAL
  Filled 2015-04-07 (×4): qty 1

## 2015-04-07 MED ORDER — METOPROLOL TARTRATE 25 MG PO TABS
25.0000 mg | ORAL_TABLET | Freq: Two times a day (BID) | ORAL | Status: DC
  Administered 2015-04-07 – 2015-04-08 (×3): 25 mg via ORAL
  Filled 2015-04-07 (×6): qty 1

## 2015-04-07 MED ORDER — MAGNESIUM HYDROXIDE 400 MG/5ML PO SUSP
30.0000 mL | Freq: Every day | ORAL | Status: DC | PRN
Start: 2015-04-07 — End: 2015-04-10
  Administered 2015-04-07: 30 mL via ORAL
  Filled 2015-04-07: qty 30

## 2015-04-07 MED ORDER — ASPIRIN 81 MG PO CHEW
81.0000 mg | CHEWABLE_TABLET | Freq: Every day | ORAL | Status: DC
  Administered 2015-04-08 – 2015-04-10 (×3): 81 mg via ORAL
  Filled 2015-04-07 (×3): qty 1

## 2015-04-07 MED ORDER — METOPROLOL TARTRATE 25 MG/10 ML ORAL SUSPENSION
25.0000 mg | Freq: Two times a day (BID) | ORAL | Status: DC
  Filled 2015-04-07 (×5): qty 10

## 2015-04-07 MED ORDER — ZOLPIDEM TARTRATE 5 MG PO TABS
5.0000 mg | ORAL_TABLET | Freq: Every evening | ORAL | Status: DC | PRN
  Administered 2015-04-08 – 2015-04-09 (×2): 5 mg via ORAL
  Filled 2015-04-07 (×2): qty 1

## 2015-04-07 MED ORDER — GUAIFENESIN-DM 100-10 MG/5ML PO SYRP: 15.0000 mL | ORAL_SOLUTION | ORAL | Status: DC | PRN

## 2015-04-07 MED ORDER — SODIUM CHLORIDE 0.9 % IV SOLN: 250.0000 mL | INTRAVENOUS | Status: DC | PRN

## 2015-04-07 MED ORDER — RIVAROXABAN 20 MG PO TABS
20.0000 mg | ORAL_TABLET | Freq: Every day | ORAL | Status: DC
  Administered 2015-04-08 – 2015-04-09 (×2): 20 mg via ORAL
  Filled 2015-04-07 (×4): qty 1

## 2015-04-07 NOTE — Progress Notes (Signed)
Pt transported to 2W07 via W/C with belongings. Pt assisted to recliner. RN and NT in room to receive pt. Report given and questions answered. Wife at bedside during transfer.

## 2015-04-07 NOTE — Progress Notes (Signed)
Patient's daughter called with concerns about patient's wife's ability to care for patient at discharge. Daughter says patient may need home health RN for assistance. Advised her we would address needs with patient and get care management assistance if needed.

## 2015-04-07 NOTE — Progress Notes (Signed)
2 Days Post-Op Procedure(s) (LRB): CORONARY ARTERY BYPASS GRAFTING (CABG)X4 LIMA-LAD; SVG-DIAG1-DIAG2; SVG-PD (N/A) TRANSESOPHAGEAL ECHOCARDIOGRAM (TEE) (N/A) Subjective: Feels well this AM No pain, ambulated around unit  Objective: Vital signs in last 24 hours: Temp:  [97.8 F (36.6 C)-99.3 F (37.4 C)] 98 F (36.7 C) (04/17 0800) Pulse Rate:  [83-100] 87 (04/17 0900) Cardiac Rhythm:  [-] Normal sinus rhythm (04/17 0900) Resp:  [12-30] 16 (04/17 0900) BP: (90-162)/(38-105) 126/58 mmHg (04/17 0900) SpO2:  [91 %-99 %] 93 % (04/17 0900) Arterial Line BP: (126-136)/(51-57) 136/57 mmHg (04/16 1115) Weight:  [188 lb 7.9 oz (85.5 kg)] 188 lb 7.9 oz (85.5 kg) (04/17 0500)  Hemodynamic parameters for last 24 hours: PAP: (29-31)/(22-24) 29/24 mmHg  Intake/Output from previous day: 04/16 0701 - 04/17 0700 In: 1018.5 [P.O.:240; I.V.:678.5; IV Piggyback:100] Out: 705 [Urine:705] Intake/Output this shift: Total I/O In: 290 [P.O.:200; I.V.:40; IV Piggyback:50] Out: 65 [Urine:65]  General appearance: alert and no distress Neurologic: intact Heart: regular rate and rhythm Lungs: diminished breath sounds bibasilar Abdomen: normal findings: soft, non-tender  Lab Results:  Recent Labs  04/06/15 1700 04/06/15 1706 04/07/15 0421  WBC 16.6*  --  16.8*  HGB 11.3* 10.9* 10.9*  HCT 33.3* 32.0* 33.1*  PLT 161  --  160   BMET:  Recent Labs  04/06/15 0420  04/06/15 1706 04/07/15 0421  NA 134*  --  136 135  K 3.7  --  4.3 4.2  CL 102  --  101 102  CO2 21  --   --  24  GLUCOSE 112*  --  130* 119*  BUN 8  --  14 16  CREATININE 0.94  < > 1.10 1.10  CALCIUM 7.6*  --   --  8.0*  < > = values in this interval not displayed.  PT/INR:  Recent Labs  04/05/15 1315  LABPROT 16.9*  INR 1.36   ABG    Component Value Date/Time   PHART 7.330* 04/05/2015 2032   HCO3 20.7 04/05/2015 2032   TCO2 21 04/06/2015 1706   ACIDBASEDEF 5.0* 04/05/2015 2032   O2SAT 98.0 04/05/2015 2032    CBG (last 3)   Recent Labs  04/07/15 0022 04/07/15 0405 04/07/15 0817  GLUCAP 126* 119* 127*    Assessment/Plan: S/P Procedure(s) (LRB): CORONARY ARTERY BYPASS GRAFTING (CABG)X4 LIMA-LAD; SVG-DIAG1-DIAG2; SVG-PD (N/A) TRANSESOPHAGEAL ECHOCARDIOGRAM (TEE) (N/A) Plan for transfer to step-down: see transfer orders   Doing well POD # 2  CV- stable in SR, BP a little high- increase lopressor  RESP_ IS for atelectasis  RENAL_ lytes and creatinine OK, dc foley  ENDO- CBG well controlled, dc levimir, continue SSI  Anticoagulation- will continue enoxaparin today, restart xarelto tomorrow PM  Will dc pacing wires tomorrow AM if no arrhythmias  transfer to 2 west   LOS: 5 days    [name] 04/07/2015

## 2015-04-08 ENCOUNTER — Inpatient Hospital Stay (HOSPITAL_COMMUNITY): Payer: Medicare Other

## 2015-04-08 ENCOUNTER — Encounter (HOSPITAL_COMMUNITY): Payer: Self-pay | Admitting: Thoracic Surgery (Cardiothoracic Vascular Surgery)

## 2015-04-08 LAB — CBC
HCT: 33.2 % — ABNORMAL LOW (ref 39.0–52.0)
HEMOGLOBIN: 11.1 g/dL — AB (ref 13.0–17.0)
MCH: 31.6 pg (ref 26.0–34.0)
MCHC: 33.4 g/dL (ref 30.0–36.0)
MCV: 94.6 fL (ref 78.0–100.0)
PLATELETS: 188 10*3/uL (ref 150–400)
RBC: 3.51 MIL/uL — AB (ref 4.22–5.81)
RDW: 13 % (ref 11.5–15.5)
WBC: 14.2 10*3/uL — ABNORMAL HIGH (ref 4.0–10.5)

## 2015-04-08 LAB — BASIC METABOLIC PANEL
Anion gap: 10 (ref 5–15)
BUN: 16 mg/dL (ref 6–23)
CO2: 26 mmol/L (ref 19–32)
Calcium: 8.1 mg/dL — ABNORMAL LOW (ref 8.4–10.5)
Chloride: 103 mmol/L (ref 96–112)
Creatinine, Ser: 1.11 mg/dL (ref 0.50–1.35)
GFR, EST AFRICAN AMERICAN: 74 mL/min — AB (ref >90)
GFR, EST NON AFRICAN AMERICAN: 64 mL/min — AB (ref >90)
Glucose, Bld: 94 mg/dL (ref 70–99)
Potassium: 4 mmol/L (ref 3.5–5.1)
SODIUM: 139 mmol/L (ref 135–145)

## 2015-04-08 LAB — GLUCOSE, CAPILLARY
GLUCOSE-CAPILLARY: 96 mg/dL (ref 70–99)
Glucose-Capillary: 133 mg/dL — ABNORMAL HIGH (ref 70–99)
Glucose-Capillary: 116 mg/dL — ABNORMAL HIGH (ref 70–99)

## 2015-04-08 MED ORDER — LISINOPRIL 5 MG PO TABS
5.0000 mg | ORAL_TABLET | Freq: Every day | ORAL | Status: DC
  Filled 2015-04-08: qty 1

## 2015-04-08 MED ORDER — AMIODARONE LOAD VIA INFUSION
150.0000 mg | Freq: Once | INTRAVENOUS | Status: AC
  Administered 2015-04-08: 150 mg via INTRAVENOUS
  Filled 2015-04-08 (×2): qty 83.34

## 2015-04-08 MED ORDER — AMIODARONE HCL IN DEXTROSE 360-4.14 MG/200ML-% IV SOLN
30.0000 mg/h | INTRAVENOUS | Status: AC
  Filled 2015-04-08 (×6): qty 200

## 2015-04-08 MED ORDER — LISINOPRIL 2.5 MG PO TABS
2.5000 mg | ORAL_TABLET | Freq: Every day | ORAL | Status: DC
  Administered 2015-04-08 – 2015-04-10 (×3): 2.5 mg via ORAL
  Filled 2015-04-08 (×3): qty 1

## 2015-04-08 MED ORDER — LACTULOSE 10 GM/15ML PO SOLN
20.0000 g | Freq: Once | ORAL | Status: AC
  Administered 2015-04-08: 20 g via ORAL
  Filled 2015-04-08: qty 30

## 2015-04-08 MED ORDER — AMIODARONE HCL IN DEXTROSE 360-4.14 MG/200ML-% IV SOLN
60.0000 mg/h | INTRAVENOUS | Status: AC
  Administered 2015-04-08 (×2): 60 mg/h via INTRAVENOUS
  Filled 2015-04-08: qty 200

## 2015-04-08 MED FILL — Sodium Chloride IV Soln 0.9%: INTRAVENOUS | Qty: 2000 | Status: AC

## 2015-04-08 MED FILL — Heparin Sodium (Porcine) Inj 1000 Unit/ML: INTRAMUSCULAR | Qty: 10 | Status: AC

## 2015-04-08 MED FILL — Lidocaine HCl IV Inj 20 MG/ML: INTRAVENOUS | Qty: 5 | Status: AC

## 2015-04-08 MED FILL — Electrolyte-R (PH 7.4) Solution: INTRAVENOUS | Qty: 3000 | Status: AC

## 2015-04-08 MED FILL — Sodium Bicarbonate IV Soln 8.4%: INTRAVENOUS | Qty: 50 | Status: AC

## 2015-04-08 MED FILL — Mannitol IV Soln 20%: INTRAVENOUS | Qty: 500 | Status: AC

## 2015-04-08 NOTE — Progress Notes (Signed)
UR Completed.  [phone]  

## 2015-04-08 NOTE — Progress Notes (Signed)
Pt. went into afib earlier, HR in 110-140s while  Walking. MD on call notified. New order received to start amiodarone gtts.

## 2015-04-08 NOTE — Progress Notes (Addendum)
      [address]       [place] [address]             [phone]        3 Days Post-Op Procedure(s) (LRB): CORONARY ARTERY BYPASS GRAFTING (CABG)X4 LIMA-LAD; SVG-DIAG1-DIAG2; SVG-PD (N/A) TRANSESOPHAGEAL ECHOCARDIOGRAM (TEE) (N/A)  Subjective: Patient passing flatus but no bowel movement yet  Objective: Vital signs in last 24 hours: Temp:  [98 F (36.7 C)-99.9 F (37.7 C)] 98.2 F (36.8 C) (04/18 0431) Pulse Rate:  [87-104] 96 (04/18 0431) Cardiac Rhythm:  [-] Normal sinus rhythm (04/17 1400) Resp:  [14-23] 18 (04/18 0431) BP: (119-155)/(58-71) 119/70 mmHg (04/18 0431) SpO2:  [92 %-98 %] 98 % (04/18 0431) Weight:  [185 lb 6.5 oz (84.1 kg)] 185 lb 6.5 oz (84.1 kg) (04/18 0431)  Pre op weight 81 kg Current Weight  04/08/15 185 lb 6.5 oz (84.1 kg)      Intake/Output from previous day: 04/17 0701 - 04/18 0700 In: 430 [P.O.:320; I.V.:60; IV Piggyback:50] Out: 51 [Urine:830]   Physical Exam:  Cardiovascular: Slightly tachy Pulmonary: Slightly diminished at bases; no rales, wheezes, or rhonchi. Abdomen: Soft, non tender, bowel sounds present. Extremities: Mild bilateral lower extremity edema. Wounds: LLE wound is clean and dry.  No erythema or signs of infection. Sternal dressing is clean and dry.  Lab Results: CBC: Recent Labs  04/07/15 0421 04/08/15 0330  WBC 16.8* 14.2*  HGB 10.9* 11.1*  HCT 33.1* 33.2*  PLT 160 188   BMET:  Recent Labs  04/07/15 0421 04/08/15 0330  NA 135 139  K 4.2 4.0  CL 102 103  CO2 24 26  GLUCOSE 119* 94  BUN 16 16  CREATININE 1.10 1.11  CALCIUM 8.0* 8.1*    PT/INR:  Lab Results  Component Value Date   INR 1.36 04/05/2015   INR 1.04 03/21/2015   INR 1.0 03/06/2009   ABG:  INR: Will add last result for INR, ABG once components are confirmed Will add last 4 CBG results once components are confirmed  Assessment/Plan:  1. CV - HR in the low 100's this am. On Lopressor 25 mg bid and Xarelto 20  mg daily. Hypertensive at times. Will start low dose Lisinopril. Give Lopressor now and monitor HR as may need to increase Lopressor as well. 2.  Pulmonary - On room air. CXR this am appears to show no pneumothorax, small bilateral pleural effusions and atelectasis.Encourage incentive spirometer 3. Volume Overload - On Lasix 40 mg daily 4.  Acute blood loss anemia - H and H stable at 11.1 and 33.2 5. CBGs 109/98/96. Pre op HGA1C 6.4. On SS PRN. Will stop accu checks and SS PRN. He will need follow up with medical doctor. 6. Remove EPW 7. LOC constipation 8. Possibly home in am  ZIMMERMAN,DONIELLE MPA-C 04/08/2015,7:26 AM  Patient seen and examined, agree with above He has had 2 bowel movements today He went into a fib this afternoon- amiodarone ordered  Remo Lipps C. Roxan Hockey, MD [facility] [phone]

## 2015-04-08 NOTE — Discharge Instructions (Signed)
Endoscopic Saphenous Vein Harvesting °Care After °Refer to this sheet in the next few weeks. These instructions provide you with information on caring for yourself after your procedure. Your health care provider may also give you more specific instructions. Your treatment has been planned according to current medical practices, but problems sometimes occur. Call your health care provider if you have any problems or questions after your procedure. °HOME CARE INSTRUCTIONS °Medicine °· Take whatever pain medicine your surgeon prescribes. Follow the directions carefully. Do not take over-the-counter pain medicine unless your surgeon says it is okay. Some pain medicine can cause bleeding problems for several weeks after surgery. °· Follow your surgeon's instructions about driving. You will probably not be permitted to drive after heart surgery. °· Take any medicines your surgeon prescribes. Any medicines you took before your heart surgery should be checked with your health care provider before you start taking them again. °Wound care °· If your surgeon has prescribed an elastic bandage or stocking, ask how long you should wear it. °· Check the area around your surgical cuts (incisions) whenever your bandages (dressings) are changed. Look for any redness or swelling. °· You will need to return to have the stitches (sutures) or staples taken out. Ask your surgeon when to do that. °· Ask your surgeon when you can shower or bathe. °Activity °· Try to keep your legs raised when you are sitting. °· Do any exercises your health care providers have given you. These may include deep breathing exercises, coughing, walking, or other exercises. °SEEK MEDICAL CARE IF: °· You have any questions about your medicines. °· You have more leg pain, especially if your pain medicine stops working. °· New or growing bruises develop on your leg. °· Your leg swells, feels tight, or becomes red. °· You have numbness in your leg. °SEEK IMMEDIATE  MEDICAL CARE IF: °· Your pain gets much worse. °· Blood or fluid leaks from any of the incisions. °· Your incisions become warm, swollen, or red. °· You have chest pain. °· You have trouble breathing. °· You have a fever. °· You have more pain near your leg incision. °MAKE SURE YOU: °· Understand these instructions. °· Will watch your condition. °· Will get help right away if you are not doing well or get worse. °Document Released: 08/19/2011 Document Revised: 12/12/2013 Document Reviewed: 08/19/2011 °ExitCare® Patient Information ©2015 [employer]. This information is not intended to replace advice given to you by your health care provider. Make sure you discuss any questions you have with your health care provider. °Coronary Artery Bypass Grafting, Care After °These instructions give you information on caring for yourself after your procedure. Your doctor may also give you more specific instructions. Call your doctor if you have any problems or questions after your procedure.  °HOME CARE °· Only take medicine as told by your doctor. Take medicines exactly as told. Do not stop taking medicines or start any new medicines without talking to your doctor first. °· Take your pulse as told by your doctor. °· Do deep breathing as told by your doctor. Use your breathing device (incentive spirometer), if given, to practice deep breathing several times a day. Support your chest with a pillow or your arms when you take deep breaths or cough. °· Keep the area clean, dry, and protected where the surgery cuts (incisions) were made. Remove bandages (dressings) only as told by your doctor. If strips were applied to surgical area, do not take them off. They fall off   on their own.  Check the surgery area daily for puffiness (swelling), redness, or leaking fluid.  If surgery cuts were made in your legs:  Avoid crossing your legs.  Avoid sitting for long periods of time. Change positions every 30 minutes.  Raise your legs  when you are sitting. Place them on pillows.  Wear stockings that help keep blood clots from forming in your legs (compression stockings).  Only take sponge baths until your doctor says it is okay to take showers. Pat the surgery area dry. Do not rub the surgery area with a washcloth or towel. Do not bathe, swim, or use a hot tub until your doctor says it is okay.  Eat foods that are high in fiber. These include raw fruits and vegetables, whole grains, beans, and nuts. Choose lean meats. Avoid canned, processed, and fried foods.  Drink enough fluids to keep your pee (urine) clear or pale yellow.  Weigh yourself every day.  Rest and limit activity as told by your doctor. You may be told to:  Stop any activity if you have chest pain, shortness of breath, changes in heartbeat, or dizziness. Get help right away if this happens.  Move around often for short amounts of time or take short walks as told by your doctor. Gradually become more active. You may need help to strengthen your muscles and build endurance.  Avoid lifting, pushing, or pulling anything heavier than 10 pounds (4.5 kg) for at least 6 weeks after surgery.  Do not drive until your doctor says it is okay.  Ask your doctor when you can go back to work.  Ask your doctor when you can begin sexual activity again.  Follow up with your doctor as told. GET HELP IF:  You have puffiness, redness, more pain, or fluid draining from the incision site.  You have a fever.  You have puffiness in your ankles or legs.  You have pain in your legs.  You gain 2 or more pounds (0.9 kg) a day.  You feel sick to your stomach (nauseous) or throw up (vomit).  You have watery poop (diarrhea). GET HELP RIGHT AWAY IF:  You have chest pain that goes to your jaw or arms.  You have shortness of breath.  You have a fast or irregular heartbeat.  You notice a "clicking" in your breastbone when you move.  You have numbness or weakness in  your arms or legs.  You feel dizzy or light-headed. MAKE SURE YOU:  Understand these instructions.  Will watch your condition.  Will get help right away if you are not doing well or get worse. Document Released: 12/12/2013 Document Reviewed: 12/12/2013 [facility] Patient Information 2015 [place], [place]. This information is not intended to replace advice given to you by your health care provider. Make sure you discuss any questions you have with your health care provider.  Information on my medicine - XARELTO (rivaroxaban)  WHY WAS XARELTO PRESCRIBED FOR YOU? Xarelto was prescribed to treat blood clots that may have been found in the veins of your legs (deep vein thrombosis) or in your lungs (pulmonary embolism) and to reduce the risk of them occurring again.  What do you need to know about Xarelto? The dose is one 20 mg tablet taken ONCE A DAY with your evening meal.  DO NOT stop taking Xarelto without talking to the health care provider who prescribed the medication.  Refill your prescription for 20 mg tablets before you run out.  After discharge, you  should have regular check-up appointments with your healthcare provider that is prescribing your Xarelto.  In the future your dose may need to be changed if your kidney function changes by a significant amount.  What do you do if you miss a dose? If you are taking Xarelto TWICE DAILY and you miss a dose, take it as soon as you remember. You may take two 15 mg tablets (total 30 mg) at the same time then resume your regularly scheduled 15 mg twice daily the next day.  If you are taking Xarelto ONCE DAILY and you miss a dose, take it as soon as you remember on the same day then continue your regularly scheduled once daily regimen the next day. Do not take two doses of Xarelto at the same time.   Important Safety Information Xarelto is a blood thinner medicine that can cause bleeding. You should call your healthcare provider right  away if you experience any of the following: ? Bleeding from an injury or your nose that does not stop. ? Unusual colored urine (red or dark brown) or unusual colored stools (red or black). ? Unusual bruising for unknown reasons. ? A serious fall or if you hit your head (even if there is no bleeding).  Some medicines may interact with Xarelto and might increase your risk of bleeding while on Xarelto. To help avoid this, consult your healthcare provider or pharmacist prior to using any new prescription or non-prescription medications, including herbals, vitamins, non-steroidal anti-inflammatory drugs (NSAIDs) and supplements.  This website has more information on Xarelto: [email or web address]

## 2015-04-08 NOTE — Progress Notes (Signed)
CARDIAC REHAB PHASE I   PRE:  Rate/Rhythm: 109 afib  BP:  Supine:   Sitting: 136/65  Standing:    SaO2: 94%RA  MODE:  Ambulation: 250 ft   POST:  Rate/Rhythm: up to 145 afib, to 115 with rest  BP:  Supine:   Sitting: 181/77  Standing:    SaO2: 94%RA 1045-1110 Pt in atrial fib prior to walk. Walked 250 ft on RA with rolling walker and minimal asst. Pt slightly SOB but cut walk short when preacher arrived. Pt does have problems with hips that limit distance also. Heart rate to 145 and BP elevated after walk but heart rate to 115 with rest. Encouraged to sit in recliner but wanted to stay in straight back chair.    Graylon Good, RN BSN  04/08/2015 11:09 AM

## 2015-04-08 NOTE — Care Management Note (Signed)
    Page 1 of 1   04/10/2015     4:46:35 PM CARE MANAGEMENT NOTE 04/10/2015  Patient:  Ryan [name], Ryan [name]   Account Number:  [id number]  Date Initiated:  04/04/2015  Documentation initiated by:  Marvetta Gibbons  Subjective/Objective Assessment:   Pt with ? recent PE, admitted with  chest pain, CAD     Action/Plan:   PTA pt lived at home with spouse   Anticipated DC Date:  04/10/2015   Anticipated DC Plan:  HOME/SELF CARE         Choice offered to / List presented to:             Status of service:  Completed, signed off Medicare Important Message given?  YES (If response is "NO", the following Medicare IM given date fields will be blank) Date Medicare IM given:  04/08/2015 Medicare IM given by:  [facility] Date Additional Medicare IM given:   Additional Medicare IM given by:    Discharge Disposition:  HOME/SELF CARE  Per UR Regulation:  Reviewed for med. necessity/level of care/duration of stay  If discussed at [place] of Stay Meetings, dates discussed:    Comments:  ContactCorie, [place] Spouse [phone]  04-08-15 9am Luz Lex, [place] [phone] Lives at home with spouse.  Plan for wife to be with him 24/7 post discharge.  04-06-15 3:35pm Luz Lex, RNBSN [phone] Post op CABG x4.  04/04/15- 1100- Marvetta Gibbons RN BSN [phone] Cath showed severe 3VD, plan for CABG on 04/05/15- NCM to follow post op for d/c needs

## 2015-04-08 NOTE — Progress Notes (Signed)
Nurse just informed me patient went into a fib around 10:30 this am. His rate was in the low 100's. Upon my arrival, his HR is in the 110-120's. Will start Amiodarone drip protocol with bolus.

## 2015-04-08 NOTE — Discharge Summary (Signed)
[address]       [place], [address]             [phone]      Physician Discharge Summary  Patient ID: Ryan [name] MRN: [id number] DOB/AGE: 25-Jan-1941 74 y.o.  Admit date: 04/01/2015 Discharge date:   Admission Diagnoses: Coronary artery disease with unstable angina  Discharge Diagnoses:  Active Problems:   Pulmonary embolism   Pain in the chest   Coronary artery disease due to calcified coronary lesion   S/P CABG x 4  Patient Active Problem List   Diagnosis Date Noted  . S/P CABG x 4 04/05/2015  . Pain in the chest   . Coronary artery disease due to calcified coronary lesion   . Pulmonary embolism 04/01/2015  . Atypical chest pain 03/20/2015  . Pulmonary emboli 03/20/2015  . Carotid stenosis 10/09/2014  . PVD (peripheral vascular disease) 10/09/2014  . HTN (hypertension) 05/30/2014  . Arthritis of left hip 01/09/2014  . Status post THR (total hip replacement) 01/09/2014  . Spinal stenosis, lumbar 01/06/2013    Class: Diagnosis of  . Coronary artery disease 01/05/2013  . Atherosclerosis of native arteries of the extremities with intermittent claudication 10/18/2012    History of present illness:  Patient is a 74 year old man with a past medical history significant for coronary artery disease. He has a history of 2 previous myocardial infarctions. He also has multiple cardiac risk factors including peripheral arterial disease including extracranial cerebrovascular occlusive disease. He also has hypertension and hyperlipidemia. Approximately 2 weeks ago he was admitted to the hospital with chest pain and shortness of breath. Cardiac enzymes at that time were negative a V/Q scan showed intermediate probability pulmonary embolism and he was started on Xarelto at that time. On the date of readmission to the hospital he developed severe chest pain that awoke him approximately 5 AM. He was described as a sharp pain radiating from the right  anterior chest to below the right scapula. He presented to the emergency department where he was found to have EKG showing nonspecific ST-T wave changes. Initial troponin was negative however repeat was 0.06. With previous history of coronary artery disease he was felt to require admission for further evaluation and treatment to include cardiac catheterization.   Discharged Condition: good  Hospital Course: The patient was admitted through the emergency department by the cardiology service. He was medically stabilized and on 04/03/2015 underwent cardiac catheterization. This revealed chronic occlusion of the RCA and circumflex as well as progression in the LAD disease. Due to these findings cardiothoracic surgical consultation was obtained with Modesto Charon M.D. The patient and his studies were evaluated and recommendations were made for coronary artery bypass grafting. A vascular surgery consultation was obtained preoperatively by Dr. Adele Barthel who recommended non-concurrent procedures for symptomatic left internal carotid artery stenosis. The patient is scheduled to be seen by Dr. Kellie Simmering in July and consideration of cardiac endarterectomy will be made at that time. The patient was felt to be stable to proceed with the cardiac procedure and on 04/05/2015 he was taken the operating room at which time he underwent the following procedure:  DATE OF PROCEDURE: 04/05/2015 DATE OF DISCHARGE:   OPERATIVE REPORT   PREOPERATIVE DIAGNOSIS: Severe three-vessel coronary disease.  POSTOPERATIVE DIAGNOSIS: Severe three-vessel coronary disease.  PROCEDURE: Median sternotomy, extracorporeal circulation, coronary artery bypass grafting x4 (left internal mammary artery to left anterior descending, sequential saphenous vein graft to diagonal 1 A  and B, saphenous vein graft to posterior descending), endoscopic vein harvest, left thigh.  SURGEON: Revonda Standard. Roxan Hockey,  M.D.  ASSISTANT: John Giovanni, PA-C  ANESTHESIA: General.  FINDINGS: Transesophageal echocardiography showed trace mitral regurgitation. There was a mildly impaired left ventricular function plaque palpable in ascending aorta near the sino-tubular junction and also in the distal ascending aorta near the takeoff the innominate artery. Coronary arteries diffusely diseased, LAD and PDA fair targets, diagonal branches poor targets, good quality conduits. The patient was taken from the operating room to the surgical intensive care unit in good condition.  Postoperative hospital course: Patient is done quite well. He is maintained stable hemodynamics with titration of beta blocker first slightly increased heart rate at times. He has been placed on Xarelto postoperatively. He has had some mild postoperative volume overload but is responded to diuretics. He has a mild acute blood loss anemia and values have stabilized. Blood sugars are under adequate control. He is tolerating gradually increasing activities using standard protocols. Incisions are healing well without evidence of infection. He is tolerating diet. Renal function is within normal limits. He has developed atrial fibrillation which is currently rate controlled on amiodarone and beta blocker. It is currently intermittent. The amio has been down titrated for increased QTc >500 to 200 bid at time of discharge. He is otherwise felt to be quite stable for discharge on todays date.     Consults: vascular surgery  Significant Diagnostic Studies: angiography: coronary    Discharge Exam: Blood pressure 119/70, pulse 96, temperature 98.2 F (36.8 C), temperature source Oral, resp. rate 18, height 5\' 6"  (1.676 m), weight 185 lb 6.5 oz (84.1 kg), SpO2 98 %. General appearance: alert, cooperative and no distress Heart: regular rate and rhythm Lungs: dim in bases Abdomen: soft, non-tender; bowel sounds normal; no masses, no  organomegaly Extremities: Minor edema Wound: incis healing well  Disposition: 01-Home or Self Care    Medications at time of discharge:    Medication List    STOP taking these medications        amLODipine 5 MG tablet  Commonly known as:  NORVASC     HYDROcodone-acetaminophen 5-325 MG per tablet  Commonly known as:  NORCO     indomethacin 75 MG CR capsule  Commonly known as:  INDOCIN SR     nitroGLYCERIN 0.4 MG SL tablet  Commonly known as:  NITROSTAT      TAKE these medications        amiodarone 200 MG tablet  Commonly known as:  PACERONE  Take 1 tablet (200 mg total) by mouth 2 (two) times daily.     aspirin 81 MG chewable tablet  Chew 1 tablet (81 mg total) by mouth daily.     lisinopril 2.5 MG tablet  Commonly known as:  PRINIVIL,ZESTRIL  Take 1 tablet (2.5 mg total) by mouth daily.     Metoprolol Tartrate 37.5 MG Tabs  Take 37.5 mg by mouth 2 (two) times daily.     rivaroxaban 20 MG Tabs tablet  Commonly known as:  XARELTO  Take 1 tablet (20 mg total) by mouth daily with supper.     rosuvastatin 20 MG tablet  Commonly known as:  CRESTOR  Take 1 tablet (20 mg total) by mouth daily.     silodosin 8 MG Caps capsule  Commonly known as:  RAPAFLO  Take 8 mg by mouth at bedtime.     traMADol 50 MG tablet  Commonly known as:  Veatrice Bourbon  Take 1-2 tablets (50-100 mg total) by mouth every 6 (six) hours as needed for moderate pain.        Follow-up: The patient has been discharged on:   1.Beta Blocker:  Yes Blue.Reese   ]                              No   [   ]                              If No, reason:  2.Ace Inhibitor/ARB: Yes [ yy  ]                                     No  [    ]                                     If No, reason:  3.Statin:   Yes Blue.Reese   ]                  No  [   ]                  If No, reason:  4.Ecasa:  Yes  [ y  ]                  No   [   ]                  If No, reason:  Follow-up Information    Follow up with [name], MD.   Specialty:  Cardiothoracic Surgery   Why:  05/07/2015 at 9:30 AM to see Dr. Roxan Hockey. Please obtain a chest x-ray and [facility] at 9 AM. [facility] is located in the same office complex.   Contact information:   [address] [place] [address] [phone]       Follow up with Angelena Form R, PA-C.   Specialty:  Cardiology   Why:  05/01/15 at 8 am for cardiology follow-up appt   Contact information:   [address] [place] [id number] [phone]        Signed: John Giovanni 04/08/2015, 9:34 AM

## 2015-04-09 LAB — GLUCOSE, CAPILLARY
GLUCOSE-CAPILLARY: 133 mg/dL — AB (ref 70–99)
Glucose-Capillary: 140 mg/dL — ABNORMAL HIGH (ref 70–99)

## 2015-04-09 LAB — TSH: TSH: 11.564 u[IU]/mL — ABNORMAL HIGH (ref 0.350–4.500)

## 2015-04-09 MED ORDER — ALPRAZOLAM 0.25 MG PO TABS
0.2500 mg | ORAL_TABLET | Freq: Every day | ORAL | Status: DC | PRN
  Administered 2015-04-09: 0.25 mg via ORAL
  Filled 2015-04-09 (×2): qty 1

## 2015-04-09 MED ORDER — METOPROLOL TARTRATE 25 MG PO TABS
37.5000 mg | ORAL_TABLET | Freq: Two times a day (BID) | ORAL | Status: DC
  Administered 2015-04-09 – 2015-04-10 (×3): 37.5 mg via ORAL
  Filled 2015-04-09 (×4): qty 1

## 2015-04-09 MED ORDER — AMIODARONE HCL 200 MG PO TABS
400.0000 mg | ORAL_TABLET | Freq: Two times a day (BID) | ORAL | Status: DC
  Administered 2015-04-09 – 2015-04-10 (×2): 400 mg via ORAL
  Filled 2015-04-09 (×3): qty 2

## 2015-04-09 MED ORDER — METOPROLOL TARTRATE 25 MG/10 ML ORAL SUSPENSION
37.5000 mg | Freq: Two times a day (BID) | ORAL | Status: DC
  Filled 2015-04-09 (×4): qty 15

## 2015-04-09 MED ORDER — AMIODARONE IV BOLUS ONLY 150 MG/100ML
150.0000 mg | Freq: Once | INTRAVENOUS | Status: AC
  Administered 2015-04-09: 150 mg via INTRAVENOUS
  Filled 2015-04-09: qty 100

## 2015-04-09 MED FILL — Heparin Sodium (Porcine) Inj 1000 Unit/ML: INTRAMUSCULAR | Qty: 30 | Status: AC

## 2015-04-09 MED FILL — Potassium Chloride Inj 2 mEq/ML: INTRAVENOUS | Qty: 40 | Status: AC

## 2015-04-09 MED FILL — Magnesium Sulfate Inj 50%: INTRAMUSCULAR | Qty: 10 | Status: AC

## 2015-04-09 NOTE — Progress Notes (Addendum)
Ryan [name]       [name] [id number]             [phone]      4 Days Post-Op Procedure(s) (LRB): CORONARY ARTERY BYPASS GRAFTING (CABG)X4 LIMA-LAD; SVG-DIAG1-DIAG2; SVG-PD (N/A) TRANSESOPHAGEAL ECHOCARDIOGRAM (TEE) (N/A) Subjective: Intermitt. Afib, rate mostly controlled  Objective: Vital signs in last 24 hours: Temp:  [98.2 F (36.8 C)-98.9 F (37.2 C)] 98.9 F (37.2 C) (04/19 0556) Pulse Rate:  [84-103] 99 (04/19 0556) Cardiac Rhythm:  [-] Atrial fibrillation (04/18 1100) Resp:  [18-20] 18 (04/19 0556) BP: (115-155)/(58-73) 155/67 mmHg (04/19 0556) SpO2:  [94 %-95 %] 95 % (04/19 0556) Weight:  [183 lb 3.2 oz (83.1 kg)] 183 lb 3.2 oz (83.1 kg) (04/19 0556)  Hemodynamic parameters for last 24 hours:    Intake/Output from previous day: 04/18 0701 - 04/19 0700 In: -  Out: 1 [Urine:1] Intake/Output this shift:    General appearance: alert, cooperative and no distress Heart: regular rate and rhythm Lungs: dim in bases Abdomen: benign Extremities: no edema Wound: incis healing well  Lab Results:  Recent Labs  04/07/15 0421 04/08/15 0330  WBC 16.8* 14.2*  HGB 10.9* 11.1*  HCT 33.1* 33.2*  PLT 160 188   BMET:  Recent Labs  04/07/15 0421 04/08/15 0330  NA 135 139  K 4.2 4.0  CL 102 103  CO2 24 26  GLUCOSE 119* 94  BUN 16 16  CREATININE 1.10 1.11  CALCIUM 8.0* 8.1*    PT/INR: No results for input(s): LABPROT, INR in the last 72 hours. ABG    Component Value Date/Time   PHART 7.330* 04/05/2015 2032   HCO3 20.7 04/05/2015 2032   TCO2 21 04/06/2015 1706   ACIDBASEDEF 5.0* 04/05/2015 2032   O2SAT 98.0 04/05/2015 2032   CBG (last 3)   Recent Labs  04/08/15 0627 04/08/15 1146 04/08/15 1710  GLUCAP 96 133* 116*    Meds Scheduled Meds: . acetaminophen  1,000 mg Oral 4 times per day   Or  . acetaminophen (TYLENOL) oral liquid 160 mg/5 mL  1,000 mg Per Tube 4 times per day  . aspirin  81 mg Oral Daily  .  bisacodyl  10 mg Oral Daily   Or  . bisacodyl  10 mg Rectal Daily  . docusate sodium  200 mg Oral Daily  . lisinopril  2.5 mg Oral Daily  . metoprolol tartrate  25 mg Oral BID   Or  . metoprolol tartrate  25 mg Per Tube BID  . pantoprazole  40 mg Oral Daily  . rivaroxaban  20 mg Oral Q supper  . rosuvastatin  20 mg Oral q1800  . sodium chloride  3 mL Intravenous Q12H  . tamsulosin  0.4 mg Oral QPC supper   Continuous Infusions: . amiodarone 30 mg/hr (04/08/15 1927)   PRN Meds:.sodium chloride, alum & mag hydroxide-simeth, guaiFENesin-dextromethorphan, magnesium hydroxide, ondansetron (ZOFRAN) IV, oxyCODONE, sodium chloride, traMADol, zolpidem  Xrays Dg Chest 2 View  04/08/2015   CLINICAL DATA:  Chest pain, status post are bypass grafting  EXAM: CHEST  2 VIEW  COMPARISON:  04/07/15  FINDINGS: Cardiac shadow is enlarged but stable. Left basilar changes are again seen and stable. No new focal infiltrate is seen. No pneumothorax is noted. Postsurgical changes in left shoulder are again seen.  IMPRESSION: No change in left basilar atelectasis and likely small effusion.   Electronically Signed   By: Inez Catalina M.D.   On:  04/08/2015 07:52    Assessment/Plan: S/P Procedure(s) (LRB): CORONARY ARTERY BYPASS GRAFTING (CABG)X4 LIMA-LAD; SVG-DIAG1-DIAG2; SVG-PD (N/A) TRANSESOPHAGEAL ECHOCARDIOGRAM (TEE) (N/A)  1 doing well but conts with afib 2 cont amiodarone IV for now and will need transition to po 3 check TSH - was elevated in past but no supplement 4 no new labs today- will repeat in am 5 cont rehab and pulm toilet   LOS: 7 days    GOLD,WAYNE E 04/09/2015  Patient seen and examined, agree with above Will increase lopressor and rebolus with amiodarone this AM May be able to change to PO amiodarone this afternoon  Remo Lipps C. Roxan Hockey, MD [facility] [phone]

## 2015-04-09 NOTE — Progress Notes (Signed)
Back in SR with PACs and occasional a fib.  Will change to PO amiodarone  He is on Xarelto already due to his possible PE.   Home in AM if rate controlled and tolerating activity  Remo Lipps C. Roxan Hockey, MD [facility] [phone]

## 2015-04-09 NOTE — Progress Notes (Signed)
CARDIAC REHAB PHASE I   PRE:  Rate/Rhythm: 96 afib  BP:  Supine: 122/60  Sitting:   Standing:    SaO2: 94%RA  MODE:  Ambulation: 400 ft   POST:  Rate/Rhythm: 126 afib    99 with rest  BP:  Supine:   Sitting: 158/67  Standing:    SaO2: 96%RA 0842-0908 Pt walked 400 ft on RA with rolling walker with steady gait. Tolerated well. Stated only walked once yesterday. Encouraged pt to ask staff to walk later to get two more walks in. To chair with call bell.    Graylon Good, RN BSN  04/09/2015 9:04 AM

## 2015-04-09 NOTE — Progress Notes (Signed)
Patients son called to room to ask that patient be given another room across the hall.  No rooms available as unit full.  Son concerned that pt had no view except roof, an air conditioning unit which never shut off and it was not good for his nerves.  Pt, wife and son were made aware of inability to transfer patient.  Pt stated he asked to move the day before and was told he could not because "females were on the other side of the hall". Room # 10 was empty being cleaned in preparation for admission. Son was resistant and wanted to speak to someone in charge.  AC called and came to speak to family and patient.  [facility] met son on his way off the unit, patient OK with staying in room and getting something to help him sleep.  AC will re-evaluate tomorrow if patient will be admitted longer. Pt resting with call bell within reach.  Will continue to monitor. Payton Emerald, RN

## 2015-04-10 ENCOUNTER — Encounter (HOSPITAL_COMMUNITY): Payer: Self-pay | Admitting: Physician Assistant

## 2015-04-10 DIAGNOSIS — I4891 Unspecified atrial fibrillation: Secondary | ICD-10-CM

## 2015-04-10 LAB — BASIC METABOLIC PANEL
Anion gap: 11 (ref 5–15)
BUN: 13 mg/dL (ref 6–23)
CALCIUM: 8.4 mg/dL (ref 8.4–10.5)
CO2: 26 mmol/L (ref 19–32)
CREATININE: 1.08 mg/dL (ref 0.50–1.35)
Chloride: 102 mmol/L (ref 96–112)
GFR calc non Af Amer: 66 mL/min — ABNORMAL LOW (ref >90)
GFR, EST AFRICAN AMERICAN: 77 mL/min — AB (ref >90)
Glucose, Bld: 115 mg/dL — ABNORMAL HIGH (ref 70–99)
Potassium: 3.9 mmol/L (ref 3.5–5.1)
Sodium: 139 mmol/L (ref 135–145)

## 2015-04-10 LAB — MAGNESIUM: MAGNESIUM: 2.1 mg/dL (ref 1.5–2.5)

## 2015-04-10 LAB — GLUCOSE, CAPILLARY
Glucose-Capillary: 120 mg/dL — ABNORMAL HIGH (ref 70–99)
Glucose-Capillary: 112 mg/dL — ABNORMAL HIGH (ref 70–99)

## 2015-04-10 MED ORDER — AMIODARONE HCL 200 MG PO TABS: 200.0000 mg | ORAL_TABLET | Freq: Two times a day (BID) | ORAL | Status: DC

## 2015-04-10 MED ORDER — LISINOPRIL 2.5 MG PO TABS: 2.5000 mg | ORAL_TABLET | Freq: Every day | ORAL | Status: DC

## 2015-04-10 MED ORDER — TRAMADOL HCL 50 MG PO TABS: 50.0000 mg | ORAL_TABLET | Freq: Four times a day (QID) | ORAL | Status: DC | PRN

## 2015-04-10 MED ORDER — ASPIRIN 81 MG PO CHEW: 81.0000 mg | CHEWABLE_TABLET | Freq: Every day | ORAL | Status: DC

## 2015-04-10 MED ORDER — AMIODARONE HCL 200 MG PO TABS
200.0000 mg | ORAL_TABLET | Freq: Two times a day (BID) | ORAL | Status: DC
  Filled 2015-04-10: qty 1

## 2015-04-10 MED ORDER — METOPROLOL TARTRATE 37.5 MG PO TABS: 37.5000 mg | ORAL_TABLET | Freq: Two times a day (BID) | ORAL | Status: DC

## 2015-04-10 NOTE — Progress Notes (Signed)
Patient Name: Ryan [name] Date of Encounter: 04/10/2015     Active Problems:   Pulmonary embolism   Pain in the chest   Coronary artery disease due to calcified coronary lesion   S/P CABG x 4    SUBJECTIVE  Feeling well. Ready to go home.  CURRENT MEDS . acetaminophen  1,000 mg Oral 4 times per day   Or  . acetaminophen (TYLENOL) oral liquid 160 mg/5 mL  1,000 mg Per Tube 4 times per day  . amiodarone  200 mg Oral BID  . aspirin  81 mg Oral Daily  . bisacodyl  10 mg Oral Daily   Or  . bisacodyl  10 mg Rectal Daily  . docusate sodium  200 mg Oral Daily  . lisinopril  2.5 mg Oral Daily  . metoprolol tartrate  37.5 mg Oral BID   Or  . metoprolol tartrate  37.5 mg Per Tube BID  . pantoprazole  40 mg Oral Daily  . rivaroxaban  20 mg Oral Q supper  . rosuvastatin  20 mg Oral q1800  . sodium chloride  3 mL Intravenous Q12H  . tamsulosin  0.4 mg Oral QPC supper    OBJECTIVE  Filed Vitals:   04/09/15 2136 04/09/15 2138 04/10/15 0549 04/10/15 0838  BP:  123/65 137/62 108/56  Pulse: 88 91 87 91  Temp:  98.3 F (36.8 C) 97.7 F (36.5 C) 98.5 F (36.9 C)  TempSrc:  Oral Oral Oral  Resp:  18 18 18   Height:      Weight:   182 lb 15.7 oz (83 kg)   SpO2:  95% 96% 96%    Intake/Output Summary (Last 24 hours) at 04/10/15 1028 Last data filed at 04/10/15 0815  Gross per 24 hour  Intake    720 ml  Output    625 ml  Net     95 ml   Filed Weights   04/08/15 0431 04/09/15 0556 04/10/15 0549  Weight: 185 lb 6.5 oz (84.1 kg) 183 lb 3.2 oz (83.1 kg) 182 lb 15.7 oz (83 kg)    PHYSICAL EXAM  General appearance: alert, cooperative and no distress Heart: regular rate and rhythm Lungs: dim in bases Abdomen: soft, non-tender; bowel sounds normal; no masses, no organomegaly Extremities: Minor edema Wound: incis healing well  Accessory Clinical Findings  CBC  Recent Labs  04/08/15 0330  WBC 14.2*  HGB 11.1*  HCT 33.2*  MCV 94.6  PLT 416   Basic Metabolic  Panel  Recent Labs  04/08/15 0330 04/10/15 0511  NA 139 139  K 4.0 3.9  CL 103 102  CO2 26 26  GLUCOSE 94 115*  BUN 16 13  CREATININE 1.11 1.08  CALCIUM 8.1* 8.4  MG  --  2.1   Thyroid Function Tests  Recent Labs  04/09/15 0935  TSH 11.564*    TELE  afib with CVR. Qtc prolonged  Radiology/Studies  Dg Chest 2 View  04/08/2015   CLINICAL DATA:  Chest pain, status post are bypass grafting  EXAM: CHEST  2 VIEW  COMPARISON:  04/07/15  FINDINGS: Cardiac shadow is enlarged but stable. Left basilar changes are again seen and stable. No new focal infiltrate is seen. No pneumothorax is noted. Postsurgical changes in left shoulder are again seen.  IMPRESSION: No change in left basilar atelectasis and likely small effusion.   Electronically Signed   By: Inez Catalina M.D.   On: 04/08/2015 07:52   Dg Chest 2 View (  if Patient Has Fever And/or Copd)  04/01/2015   CLINICAL DATA:  Right-sided chest pain  EXAM: CHEST  2 VIEW  COMPARISON:  03/20/2015  FINDINGS: Cardiac shadow is stable. The lungs are well aerated bilaterally. No focal infiltrate or sizable effusion is seen postsurgical changes in left shoulder are again noted.  IMPRESSION: No active cardiopulmonary disease.   Electronically Signed   By: Inez Catalina M.D.   On: 04/01/2015 09:46   Dg Chest 2 View  03/20/2015   CLINICAL DATA:  Chest pain.  Shortness of breath.  EXAM: CHEST  2 VIEW  COMPARISON:  01/02/2014  FINDINGS: Heart size and pulmonary vascularity are normal and the lungs are clear. No acute osseous abnormality. Surgical changes at the left shoulder.  IMPRESSION: No acute abnormalities.   Electronically Signed   By: Lorriane Shire M.D.   On: 03/20/2015 10:48   Ct Angio Chest Pe W/cm &/or Wo Cm  04/01/2015   CLINICAL DATA:  Acute chest pain, shortness of breath.  EXAM: CT ANGIOGRAPHY CHEST WITH CONTRAST  TECHNIQUE: Multidetector CT imaging of the chest was performed using the standard protocol during bolus administration of  intravenous contrast. Multiplanar CT image reconstructions and MIPs were obtained to evaluate the vascular anatomy.  CONTRAST:  29mL OMNIPAQUE IOHEXOL 350 MG/ML SOLN  COMPARISON:  Radiographs of April 01, 2015.  FINDINGS: No pneumothorax or pleural effusion is noted. No acute pulmonary disease is noted. There is no evidence of pulmonary embolus. There is no evidence of thoracic aortic dissection or aneurysm. Great vessels are widely patent. Coronary artery calcifications are noted. Visualized portion of upper abdomen appears normal. Status post left shoulder arthroplasty.  Review of the MIP images confirms the above findings.  IMPRESSION: No evidence of pulmonary embolus. No acute abnormality seen within the chest.   Electronically Signed   By: Marijo Conception, M.D.   On: 04/01/2015 12:56   Nm Pulmonary Perf And Vent  03/21/2015   CLINICAL DATA:  Chest pain  EXAM: NUCLEAR MEDICINE VENTILATION - PERFUSION LUNG SCAN  TECHNIQUE: Ventilation images were obtained in multiple projections using inhaled aerosol technetium 99 M DTPA. Perfusion images were obtained in multiple projections after intravenous injection of Tc-21m MAA.  RADIOPHARMACEUTICALS:  Forty mCi Tc-39m DTPA aerosol and 6.0 mCi Tc-70m MAA  COMPARISON:  Chest x-ray 03/20/2015  FINDINGS: Ventilation: No focal ventilation defect.  Perfusion: Bandlike defect noted anteriorly in the right lung base, likely right middle lobe. No other focal defects.  IMPRESSION: Single segmental perfusion defect without corresponding ventilation or chest x-ray abnormality noted in the right middle lobe. This is intermediate probability for pulmonary embolus.   Electronically Signed   By: Rolm Baptise M.D.   On: 03/21/2015 14:42   Dg Chest Port 1 View  04/07/2015   CLINICAL DATA:  Postop day 2 following CABG surgery.  EXAM: PORTABLE CHEST - 1 VIEW  COMPARISON:  04/06/2015  FINDINGS: Left lung base opacity persists consistent with combination of pleural fluid and atelectasis.   No pulmonary edema.  No pneumothorax.  All lines and tubes have been removed with the exception of the right internal jugular introducer sheath.  Cardiac silhouette is mildly enlarged.  No mediastinal widening.  IMPRESSION: 1. No evidence of an operative complication following CABG surgery. 2. Residual left lung base opacity most consistent with a combination of atelectasis and a small pleural effusion. 3. No pulmonary edema, pneumothorax or mediastinal widening. 4. Remaining right internal jugular introducer sheath is well positioned.   Electronically  Signed   By: Lajean Manes M.D.   On: 04/07/2015 07:20   Dg Chest Port 1 View  04/06/2015   CLINICAL DATA:  Status post coronary bypass grafting  EXAM: PORTABLE CHEST - 1 VIEW  COMPARISON:  04/05/2015  FINDINGS: Cardiac shadow is enlarged but stable. Changes consistent with prior coronary bypass grafting are again seen. A Swan-Ganz catheter is noted within the right pulmonary artery. A mediastinal drain and left thoracostomy catheter are again seen. No pneumothorax is noted. Left basilar atelectasis is noted increased from the prior study. The right lung remains clear. The endotracheal tube and nasogastric catheter been removed in the interval.  IMPRESSION: Increasing left basilar atelectasis  Tubes and lines as described.   Electronically Signed   By: Inez Catalina M.D.   On: 04/06/2015 07:37   Dg Chest Port 1 View  04/05/2015   CLINICAL DATA:  Status post CABG surgery postop day 1.  EXAM: PORTABLE CHEST - 1 VIEW  COMPARISON:  04/01/2015  FINDINGS: Since prior chest radiograph, CABG surgery has been performed. Standard lines and tubes are in place, including: An endotracheal tube with its tip 3.4 cm above the Carina, a right internal jugular Swan-Ganz catheter with its tip in the right pulmonary artery, a nasogastric tube passing below the diaphragm into the mid stomach, a mediastinal tube and a left inferior hemi thorax chest tube.  Cardiac silhouette is  top-normal in size. There is no mediastinal widening.  There is no pulmonary edema. There is mild left lung base atelectasis adjacent to the chest tube.  No gross pneumothorax noted on this semi-erect study.  IMPRESSION: 1. Status post CABG surgery postop day 1. No evidence of an operative complication. 2. Support apparatus is well positioned.   Electronically Signed   By: Lajean Manes M.D.   On: 04/05/2015 13:53    ASSESSMENT AND PLAN  Ryan [name] is a 74 y.o. male with a history of CAD, recent pulmonary embolism on Xarelto, HLD, and PVD who presented to [facility] on 04/01/15 with chest pain. He undwerwent LHC on 04/03/15 which revealed severe 3V CAD and he was referred for CABG. He underwent CABG x4V on 04/05/15, but developed post op atrial fibrillation on 04/08/15 and was placed on amiodarone.   Post op atrial fibrillation - Intermittent atrial fibrillation. HRs have been well controlled on amiodarone 400 mg BID and lopressor 37.5 mg BID. QTc >500 on tele. This is okay. He will go home on amio 200mg  BID and we will further titrate down at follow up as an outpatient. -- Continue  xarelto 20 mg qd.   CAD- s/p CABG x 4V. Follow up in our office.   Judy Pimple PA-C  Pager [phone]   Patient seen  Agree with findings as noted above  Continue amio as noted above  Continue Xarelto.  Dorris Carnes

## 2015-04-10 NOTE — Progress Notes (Signed)
Pt D/C home per MD order, D/C instructions review with pt and all querstions answered. Pt given all  presciptions and aware of follow up appt.  Pt alert and oriented x4, all belongings sent home with pt accompanied by spouse.

## 2015-04-10 NOTE — Progress Notes (Signed)
1215-1240 Education completed with pt and wife who voiced understanding. Discussed CRP 2 but pt declined at this time. Left brochure in case he changes his mind. Wants to ex on his own. Gave pt carb counting and heart healthy diets since HGA1C 6.4. Put on discharge video for pt and wife to view.  Pt stated he has rollator at home.  Graylon Good RN BSN 04/10/2015 12:42 PM

## 2015-04-10 NOTE — Progress Notes (Addendum)
[address]       ,[place] [address]             [phone]      5 Days Post-Op Procedure(s) (LRB): CORONARY ARTERY BYPASS GRAFTING (CABG)X4 LIMA-LAD; SVG-DIAG1-DIAG2; SVG-PD (N/A) TRANSESOPHAGEAL ECHOCARDIOGRAM (TEE) (N/A) Subjective: SR/Afib rate is controlled. QTc is prolonged> 500  Objective: Vital signs in last 24 hours: Temp:  [97.7 F (36.5 C)-98.5 F (36.9 C)] 98.5 F (36.9 C) (04/20 0838) Pulse Rate:  [87-91] 91 (04/20 0838) Cardiac Rhythm:  [-] Atrial fibrillation (04/20 0815) Resp:  [18] 18 (04/20 0838) BP: (108-137)/(56-65) 108/56 mmHg (04/20 0838) SpO2:  [95 %-96 %] 96 % (04/20 0838) Weight:  [182 lb 15.7 oz (83 kg)] 182 lb 15.7 oz (83 kg) (04/20 0549)  Hemodynamic parameters for last 24 hours:    Intake/Output from previous day: 04/19 0701 - 04/20 0700 In: 600 [P.O.:600] Out: 1000 [Urine:1000] Intake/Output this shift: Total I/O In: 480 [P.O.:480] Out: -   General appearance: alert, cooperative and no distress Heart: regular rate and rhythm Lungs: dim in bases Abdomen: soft, non-tender; bowel sounds normal; no masses,  no organomegaly Extremities: Minor edema Wound: incis healing well  Lab Results:  Recent Labs  04/08/15 0330  WBC 14.2*  HGB 11.1*  HCT 33.2*  PLT 188   BMET:  Recent Labs  04/08/15 0330 04/10/15 0511  NA 139 139  K 4.0 3.9  CL 103 102  CO2 26 26  GLUCOSE 94 115*  BUN 16 13  CREATININE 1.11 1.08  CALCIUM 8.1* 8.4    PT/INR: No results for input(s): LABPROT, INR in the last 72 hours. ABG    Component Value Date/Time   PHART 7.330* 04/05/2015 2032   HCO3 20.7 04/05/2015 2032   TCO2 21 04/06/2015 1706   ACIDBASEDEF 5.0* 04/05/2015 2032   O2SAT 98.0 04/05/2015 2032   CBG (last 3)   Recent Labs  04/08/15 2118 04/09/15 1137 04/09/15 2140  GLUCAP 133* 140* 120*    Meds Scheduled Meds: . acetaminophen  1,000 mg Oral 4 times per day   Or  . acetaminophen (TYLENOL) oral liquid 160  mg/5 mL  1,000 mg Per Tube 4 times per day  . amiodarone  400 mg Oral BID  . aspirin  81 mg Oral Daily  . bisacodyl  10 mg Oral Daily   Or  . bisacodyl  10 mg Rectal Daily  . docusate sodium  200 mg Oral Daily  . lisinopril  2.5 mg Oral Daily  . metoprolol tartrate  37.5 mg Oral BID   Or  . metoprolol tartrate  37.5 mg Per Tube BID  . pantoprazole  40 mg Oral Daily  . rivaroxaban  20 mg Oral Q supper  . rosuvastatin  20 mg Oral q1800  . sodium chloride  3 mL Intravenous Q12H  . tamsulosin  0.4 mg Oral QPC supper   Continuous Infusions:  PRN Meds:.sodium chloride, ALPRAZolam, alum & mag hydroxide-simeth, guaiFENesin-dextromethorphan, magnesium hydroxide, ondansetron (ZOFRAN) IV, oxyCODONE, sodium chloride, traMADol, zolpidem  Xrays No results found.  Assessment/Plan: S/P Procedure(s) (LRB): CORONARY ARTERY BYPASS GRAFTING (CABG)X4 LIMA-LAD; SVG-DIAG1-DIAG2; SVG-PD (N/A) TRANSESOPHAGEAL ECHOCARDIOGRAM (TEE) (N/A)  1 doing well, will d/c epw's this am and if no further probs d/c later today. Will need to determin amio dose with increased QTc   LOS: 8 days    GOLD,WAYNE E 04/10/2015  Patient seen and examined, agree with above Home today  [place]. Roxan Hockey, MD [facility] [phone]

## 2015-04-15 ENCOUNTER — Telehealth: Payer: Self-pay | Admitting: Cardiovascular Disease

## 2015-04-15 ENCOUNTER — Ambulatory Visit (INDEPENDENT_AMBULATORY_CARE_PROVIDER_SITE_OTHER): Payer: Self-pay | Admitting: Thoracic Surgery (Cardiothoracic Vascular Surgery)

## 2015-04-15 ENCOUNTER — Encounter: Payer: Self-pay | Admitting: Thoracic Surgery (Cardiothoracic Vascular Surgery)

## 2015-04-15 VITALS — BP 153/78 | HR 81 | Temp 96.9°F | Resp 18 | Ht 66.0 in | Wt 183.0 lb

## 2015-04-15 DIAGNOSIS — Z951 Presence of aortocoronary bypass graft: Secondary | ICD-10-CM

## 2015-04-15 DIAGNOSIS — I2510 Atherosclerotic heart disease of native coronary artery without angina pectoris: Secondary | ICD-10-CM

## 2015-04-15 MED ORDER — POTASSIUM CHLORIDE CRYS ER 10 MEQ PO TBCR: 10.0000 meq | EXTENDED_RELEASE_TABLET | Freq: Every day | ORAL | Status: DC

## 2015-04-15 MED ORDER — FUROSEMIDE 20 MG PO TABS: 20.0000 mg | ORAL_TABLET | Freq: Every day | ORAL | Status: DC

## 2015-04-15 MED ORDER — CEPHALEXIN 500 MG PO CAPS: 500.0000 mg | ORAL_CAPSULE | Freq: Three times a day (TID) | ORAL | Status: DC

## 2015-04-15 NOTE — Progress Notes (Signed)
[address]       [place] [address]             [phone]       HPI:  74 year old man had coronary bypass grafting on 04/05/2015. Postoperative course was Complicated by atrial fibrillation. He went home in rate controlled A. Fib. He was discharged on rivaroxaban.  Saturday he noticed drainage from his sternal incision. This was blood-tinged fluid. Nonpurulent. No clicking or popping. He also is noted swelling in his legs. He complains of difficulty sleeping and a poor appetite.  Past Medical History  Diagnosis Date  . Arthritis   . Hyperlipidemia     takes Crestor daily  . Peripheral vascular disease   . Carotid artery occlusion   . Hypertension     takes Amlodipine daily  . Chronic back pain   . History of gout     has colchicine prn  . History of colon polyps   . Urinary frequency   . Urinary urgency   . Enlarged prostate     takes Rapaflo daily  . History of kidney stones   . Hyperthyroidism     takes SYnthroid daily as instructed  . Cataract     Bil eyes/worse in left eye  . GERD (gastroesophageal reflux disease)     occasional  . Pulmonary emboli 03/20/2015    elevated d-dimer, intermediate V/Q study, atypical chest pain and SOB. Start on Xarelto 20mg  BID for 3 month  . Renal insufficiency       Current Outpatient Prescriptions  Medication Sig Dispense Refill  . amiodarone (PACERONE) 200 MG tablet Take 1 tablet (200 mg total) by mouth 2 (two) times daily. 60 tablet 1  . aspirin 81 MG chewable tablet Chew 1 tablet (81 mg total) by mouth daily.    Marland Kitchen lisinopril (PRINIVIL,ZESTRIL) 2.5 MG tablet Take 1 tablet (2.5 mg total) by mouth daily. 30 tablet 1  . Metoprolol Tartrate 37.5 MG TABS Take 37.5 mg by mouth 2 (two) times daily. 60 tablet 1  . rivaroxaban (XARELTO) 20 MG TABS tablet Take 1 tablet (20 mg total) by mouth daily with supper. 30 tablet 1  . rosuvastatin (CRESTOR) 20 MG tablet Take 1 tablet (20 mg total) by mouth daily.  (Patient taking differently: Take 20 mg by mouth daily at 6 PM. ) 90 tablet 3  . silodosin (RAPAFLO) 8 MG CAPS capsule Take 8 mg by mouth at bedtime.     . traMADol (ULTRAM) 50 MG tablet Take 1-2 tablets (50-100 mg total) by mouth every 6 (six) hours as needed for moderate pain. 50 tablet 0  . cephALEXin (KEFLEX) 500 MG capsule Take 1 capsule (500 mg total) by mouth 3 (three) times daily. 21 capsule 0  . furosemide (LASIX) 20 MG tablet Take 1 tablet (20 mg total) by mouth daily. 14 tablet 0  . potassium chloride (K-DUR,KLOR-CON) 10 MEQ tablet Take 1 tablet (10 mEq total) by mouth daily. 14 tablet 1   No current facility-administered medications for this visit.    Physical Exam BP 153/78 mmHg  Pulse 81  Temp(Src) 96.9 F (36.1 C) (Oral)  Resp 18  Ht 5\' 6"  (1.676 m)  Wt 183 lb (83.008 kg)  BMI 29.55 kg/m2  SpO2 28% 74 year old man in no acute distress Sternum stable, serous (rose) drainage from the inferior aspect of sternal wound. Wound otherwise intact. Minimal surrounding erythema. Cardiac rhythm irregular 2+ pitting edema both lower extremities  Diagnostic Tests:  none  Impression: 74 year old man who is now 10 days post coronary bypass grafting. He has some serous drainage that is very slightly blood-tinged from his sternal wound. There is no purulence. The sternum is stable. I suspect this is just inflammatory fluid. The other possibility is fat necrosis although think this was likely due to the characteristics of the fluid. There is no definite evidence of infection.  Just to be on the safe side, I am going to start him on Keflex 500 mg 3 times a day for a week.   I advised him to keep a dry gauze on the incision and to change as necessary.  His wife thinks that tramadol is acutely keeping him up at night. I think that is unlikely that is due to the tramadol. He requested a milder pain medication. I told him he can use Tylenol or something along the lines of Tylenol PM with  Benadryl at night to see if that helped with this pain and sleep issues.  He has edematous some going to put him back on Lasix 20 mg daily and potassium chloride 10 mEq daily for 2 weeks.    Plan:  Lasix and potassium for 2 weeks Keflex 500 mg by mouth 3 times a day for 7 days Local wound care I will see him back at his next scheduled appointment He knows to call if he develops fevers, chills, change in the characteristics of the drainage, increasing redness or swelling, movement in the sternum, or increased swelling in his legs.  [name], MD [facility] [phone]

## 2015-04-15 NOTE — Telephone Encounter (Signed)
New message    Patient having drainage around his site area.

## 2015-04-15 NOTE — Telephone Encounter (Signed)
Spoke with patient who c/o yellowish drainage from CABG incision.  Patient's surgery was on 4/15 by Dr. Roxan Hockey.  I advised patient and wife that he should call Dr. Leonarda Salon office to report and I gave them their office phone number.  Patient and wife verbalized understanding and agreement.

## 2015-04-16 ENCOUNTER — Ambulatory Visit: Payer: Medicare Other

## 2015-04-17 DIAGNOSIS — R339 Retention of urine, unspecified: Secondary | ICD-10-CM | POA: Diagnosis not present

## 2015-04-19 ENCOUNTER — Telehealth: Payer: Self-pay | Admitting: Cardiovascular Disease

## 2015-04-19 DIAGNOSIS — R300 Dysuria: Secondary | ICD-10-CM | POA: Diagnosis not present

## 2015-04-19 DIAGNOSIS — R972 Elevated prostate specific antigen [PSA]: Secondary | ICD-10-CM | POA: Diagnosis not present

## 2015-04-19 DIAGNOSIS — R339 Retention of urine, unspecified: Secondary | ICD-10-CM | POA: Diagnosis not present

## 2015-04-19 DIAGNOSIS — R609 Edema, unspecified: Secondary | ICD-10-CM | POA: Diagnosis not present

## 2015-04-19 NOTE — Telephone Encounter (Signed)
Pt c/o swelling: STAT is pt has developed SOB within 24 hours  1. How long have you been experiencing swelling? 2 weeks  2. Where is the swelling located? Both feet and ankle  3.  Are you currently taking a "fluid pill"?No  4.  Are you currently SOB? No  5.  Have you traveled recently?No

## 2015-04-19 NOTE — Telephone Encounter (Signed)
Spoke with patient and patient's wife who called to report that patient's legs are swelling.  I discussed patient's visit with Dr. Roxan Hockey on 4/25 and that he wanted to be notified if patient has leg swelling.  Patient goes on to explain that he had to go to the urologist this week as well due to feeling of urgency and unable to empty bladder.  Patient states he continues to have urinary urgency and difficulty emptying his bladder despite using catheter at home as needed.  He is concerned that this is a side-effect of Xarelto.  I advised patient that this sounds like a urinary issue unrelated to Xarelto or any of his other medications.  I advised that the furosemide is causing increased urination, as it should, but that if he feels the urge to urinate and then cannot, that he should contact his urologist.  Patient asked if he needed to increase the furosemide or to drink more water and I again explained that it does not sound like he is not diuresing, but instead is having difficult with his urinary tract, specifically the urethra as this is the tube that drains urine from bladder.  Patient verbalized understanding and agreement to call his urologist.

## 2015-05-01 ENCOUNTER — Encounter: Payer: Medicare Other | Admitting: Physician Assistant

## 2015-05-06 ENCOUNTER — Other Ambulatory Visit: Payer: Self-pay | Admitting: Thoracic Surgery (Cardiothoracic Vascular Surgery)

## 2015-05-06 DIAGNOSIS — Z951 Presence of aortocoronary bypass graft: Secondary | ICD-10-CM

## 2015-05-07 ENCOUNTER — Ambulatory Visit
Admission: RE | Admit: 2015-05-07 | Discharge: 2015-05-07 | Disposition: A | Discharge status: Home / Self Care | Payer: Medicare Other | Source: Ambulatory Visit | Attending: Thoracic Surgery (Cardiothoracic Vascular Surgery) | Admitting: Thoracic Surgery (Cardiothoracic Vascular Surgery)

## 2015-05-07 ENCOUNTER — Ambulatory Visit (INDEPENDENT_AMBULATORY_CARE_PROVIDER_SITE_OTHER): Payer: Self-pay | Admitting: Thoracic Surgery (Cardiothoracic Vascular Surgery)

## 2015-05-07 ENCOUNTER — Encounter: Payer: Self-pay | Admitting: Thoracic Surgery (Cardiothoracic Vascular Surgery)

## 2015-05-07 VITALS — BP 155/77 | HR 75 | Resp 16 | Ht 66.0 in | Wt 172.0 lb

## 2015-05-07 DIAGNOSIS — J90 Pleural effusion, not elsewhere classified: Secondary | ICD-10-CM | POA: Diagnosis not present

## 2015-05-07 DIAGNOSIS — Z951 Presence of aortocoronary bypass graft: Secondary | ICD-10-CM | POA: Diagnosis not present

## 2015-05-07 DIAGNOSIS — I2510 Atherosclerotic heart disease of native coronary artery without angina pectoris: Secondary | ICD-10-CM

## 2015-05-07 DIAGNOSIS — I517 Cardiomegaly: Secondary | ICD-10-CM | POA: Diagnosis not present

## 2015-05-07 MED ORDER — AMIODARONE HCL 200 MG PO TABS: 200.0000 mg | ORAL_TABLET | Freq: Every day | ORAL | Status: DC

## 2015-05-07 NOTE — Progress Notes (Signed)
[address]       [place] [address]             [phone]       HPI:  Ryan [name] returns today for scheduled postoperative follow-up visit.  He is a 74 year old man who presented with chest pain in March. He ruled out for MI. A V/Q scan was intermediate probability. He was treated empirically for possible pulmonary embolus with Xarelto. He returned about a week later with unstable angina. He was found to have three-vessel disease. He underwent coronary bypass grafting 4 on 04/05/2015.  He had some atrial fibrillation postoperatively. He was discharged home on amiodarone. He was in sinus rhythm.  I saw him back about 10 days postop with some drainage from his sternal incision. He was treated empirically with Keflex for a week. He says the drainage stopped within a couple of days and he hasn't had any further problems with that.  He feels well. He feels like he is making good progress. He has some incisional pain, but is seldom having to take tramadol. He started driving on Friday and did not have any problems with that. He is anxious to resume full activities. He denies any swelling in his legs or shortness of breath. He does complain of back pain from taking Crestor. When he holds the Crestor his back pain resolves. He tried his wife's Lipitor and that made his back pain even worse.  Past Medical History  Diagnosis Date  . Arthritis   . Hyperlipidemia     takes Crestor daily  . Peripheral vascular disease   . Carotid artery occlusion   . Hypertension     takes Amlodipine daily  . Chronic back pain   . History of gout     has colchicine prn  . History of colon polyps   . Urinary frequency   . Urinary urgency   . Enlarged prostate     takes Rapaflo daily  . History of kidney stones   . Hyperthyroidism     takes SYnthroid daily as instructed  . Cataract     Bil eyes/worse in left eye  . GERD (gastroesophageal reflux disease)     occasional  .  Pulmonary emboli 03/20/2015    elevated d-dimer, intermediate V/Q study, atypical chest pain and SOB. Start on Xarelto 20mg  BID for 3 month  . Renal insufficiency       Current Outpatient Prescriptions  Medication Sig Dispense Refill  . amiodarone (PACERONE) 200 MG tablet Take 1 tablet (200 mg total) by mouth daily. 60 tablet 1  . furosemide (LASIX) 20 MG tablet Take 1 tablet (20 mg total) by mouth daily. 14 tablet 0  . lisinopril (PRINIVIL,ZESTRIL) 2.5 MG tablet Take 1 tablet (2.5 mg total) by mouth daily. 30 tablet 1  . Metoprolol Tartrate 37.5 MG TABS Take 37.5 mg by mouth 2 (two) times daily. 60 tablet 1  . potassium chloride (K-DUR,KLOR-CON) 10 MEQ tablet Take 1 tablet (10 mEq total) by mouth daily. 14 tablet 0  . rivaroxaban (XARELTO) 20 MG TABS tablet Take 1 tablet (20 mg total) by mouth daily with supper. 30 tablet 1  . silodosin (RAPAFLO) 8 MG CAPS capsule Take 8 mg by mouth at bedtime.     . traMADol (ULTRAM) 50 MG tablet Take 1-2 tablets (50-100 mg total) by mouth every 6 (six) hours as needed for moderate pain. 50 tablet 0  . aspirin 81 MG chewable tablet Chew  1 tablet (81 mg total) by mouth daily. (Patient not taking: Reported on 05/07/2015)    . rosuvastatin (CRESTOR) 20 MG tablet Take 1 tablet (20 mg total) by mouth daily. (Patient not taking: Reported on 05/07/2015) 90 tablet 3   No current facility-administered medications for this visit.    Physical Exam BP 155/77 mmHg  [name]  Resp 16  Ht 5\' 6"  (1.676 m)  Wt 172 lb (78.019 kg)  BMI 27.77 kg/m2  SpO40 36% 74 year old man in no acute distress Well-developed well-nourished Alert and oriented 3 with no focal deficits Lungs clear with equal breath sounds bilaterally Cardiac regular rate and rhythm normal S1 and S2 Sternum stable, incision healing well Leg incision with minimal eschar, no peripheral edema  Diagnostic Tests: Chest x-ray was reviewed and shows postoperative changes with no significant effusions or  infiltrates  Impression: 74 year old man who is now about a month out from coronary bypass grafting 4 or three-vessel disease with unstable angina. He is recovering well.  He is on Xarelto for a soft diagnosis of pulmonary embolism. I think given his perioperative atrial fibrillation that should be continued for now. I do question whether he ever in fact had a pulmonary embolus, and suspect that his chest pain was anginal all along. I will defer to cardiology on how long to continue the Xarelto given those issues. He is in sinus rhythm today I think he can decrease his amiodarone to 200 mg daily at this point.  He is having myalgias due to Crestor. I advised him to hold that until he follows up with cardiology and Dr. Brigitte [name].  From a surgical standpoint he's doing well. He was advised not to lift anything over 10 pounds for another 2 weeks. Otherwise his activities are unrestricted.  Plan: He will follow-up with Dr. Brigitte [name] and Dr. Acie [name].  I would be happy to see him back any time if I can be of any further assistance with his care.  Ryan Nakayama, MD [facility] [phone]

## 2015-06-04 ENCOUNTER — Telehealth: Payer: Self-pay | Admitting: Nurse Practitioner

## 2015-06-04 ENCOUNTER — Other Ambulatory Visit (INDEPENDENT_AMBULATORY_CARE_PROVIDER_SITE_OTHER): Payer: Medicare Other | Admitting: *Deleted

## 2015-06-04 DIAGNOSIS — I25119 Atherosclerotic heart disease of native coronary artery with unspecified angina pectoris: Secondary | ICD-10-CM | POA: Diagnosis not present

## 2015-06-04 DIAGNOSIS — E785 Hyperlipidemia, unspecified: Secondary | ICD-10-CM

## 2015-06-04 DIAGNOSIS — I10 Essential (primary) hypertension: Secondary | ICD-10-CM

## 2015-06-04 LAB — HEPATIC FUNCTION PANEL
ALK PHOS: 53 U/L (ref 39–117)
ALT: 8 U/L (ref 0–53)
AST: 13 U/L (ref 0–37)
Albumin: 3.7 g/dL (ref 3.5–5.2)
Bilirubin, Direct: 0.1 mg/dL (ref 0.0–0.3)
TOTAL PROTEIN: 6.5 g/dL (ref 6.0–8.3)
Total Bilirubin: 0.5 mg/dL (ref 0.2–1.2)

## 2015-06-04 LAB — BASIC METABOLIC PANEL
BUN: 15 mg/dL (ref 6–23)
CALCIUM: 9 mg/dL (ref 8.4–10.5)
CO2: 27 meq/L (ref 19–32)
CREATININE: 1.19 mg/dL (ref 0.40–1.50)
Chloride: 105 mEq/L (ref 96–112)
GFR: 63.54 mL/min (ref >60.00)
GLUCOSE: 122 mg/dL — AB (ref 70–99)
Potassium: 4.1 mEq/L (ref 3.5–5.1)
SODIUM: 137 meq/L (ref 135–145)

## 2015-06-04 LAB — LIPID PANEL
Cholesterol: 164 mg/dL (ref 0–200)
HDL: 37.3 mg/dL — AB (ref >39.00)
LDL CALC: 99 mg/dL (ref 0–99)
NONHDL: 126.7
Total CHOL/HDL Ratio: 4
Triglycerides: 138 mg/dL (ref 0.0–149.0)
VLDL: 27.6 mg/dL (ref 0.0–40.0)

## 2015-06-04 MED ORDER — ROSUVASTATIN CALCIUM 20 MG PO TABS: 10.0000 mg | ORAL_TABLET | Freq: Every day | ORAL | Status: DC

## 2015-06-04 NOTE — Telephone Encounter (Signed)
-----   Message from Thayer Headings, MD sent at 06/04/2015 12:19 PM EDT ----- Lipids are a bit elevated.  Even on crestor.  Continue current dose of crestor . Recheck labs in 6 months

## 2015-06-04 NOTE — Telephone Encounter (Signed)
Called patient to review lab results and plan of care.  Patient reports that he could not tolerate Crestor 20 mg due to body aches and "feeling real bad."  Patient states he stopped the medication and the symptoms resolved.  He reports he has since restarted at 10 mg daily.  I asked if he thinks he is willing to continue at this dose and we will recheck his lipids in 6 months.  Patient verbalized agreement and is aware he will receive a reminder to schedule a fasting lab appointment in 6 months.

## 2015-06-11 ENCOUNTER — Encounter: Payer: Self-pay | Admitting: Nurse Practitioner

## 2015-06-11 ENCOUNTER — Other Ambulatory Visit: Payer: Self-pay | Admitting: *Deleted

## 2015-06-11 ENCOUNTER — Ambulatory Visit (INDEPENDENT_AMBULATORY_CARE_PROVIDER_SITE_OTHER): Payer: Medicare Other | Admitting: Nurse Practitioner

## 2015-06-11 VITALS — BP 124/66 | HR 79 | Ht 66.0 in | Wt 171.4 lb

## 2015-06-11 DIAGNOSIS — I10 Essential (primary) hypertension: Secondary | ICD-10-CM

## 2015-06-11 DIAGNOSIS — E785 Hyperlipidemia, unspecified: Secondary | ICD-10-CM

## 2015-06-11 DIAGNOSIS — I200 Unstable angina: Secondary | ICD-10-CM | POA: Diagnosis not present

## 2015-06-11 DIAGNOSIS — Z951 Presence of aortocoronary bypass graft: Secondary | ICD-10-CM | POA: Diagnosis not present

## 2015-06-11 LAB — CBC
HCT: 40.5 % (ref 39.0–52.0)
Hemoglobin: 13.4 g/dL (ref 13.0–17.0)
MCHC: 33 g/dL (ref 30.0–36.0)
MCV: 91.6 fl (ref 78.0–100.0)
Platelets: 276 K/uL (ref 150.0–400.0)
RBC: 4.42 Mil/uL (ref 4.22–5.81)
RDW: 15.2 % (ref 11.5–15.5)
WBC: 7.6 K/uL (ref 4.0–10.5)

## 2015-06-11 LAB — TSH: TSH: 20.1 u[IU]/mL — ABNORMAL HIGH (ref 0.35–4.50)

## 2015-06-11 MED ORDER — ASPIRIN 81 MG PO CHEW: 81.0000 mg | CHEWABLE_TABLET | Freq: Every day | ORAL | Status: DC

## 2015-06-11 MED ORDER — LEVOTHYROXINE SODIUM 25 MCG PO TABS: 25.0000 ug | ORAL_TABLET | Freq: Every day | ORAL | Status: DC

## 2015-06-11 NOTE — Patient Instructions (Signed)
We will be checking the following labs today - CBC & TSH   Medication Instructions:    Continue with your current medicines. Follow the med sheet you are given today.   Restart aspirin at 81 mg    Testing/Procedures To Be Arranged:  N/A  Follow-Up:   See Dr. Acie Fredrickson in 3 months    Other Special Instructions:   Walking every day - goal is 45 minutes a day.   Call the [place] office at [phone] if you have any questions, problems or concerns.

## 2015-06-11 NOTE — Progress Notes (Signed)
CARDIOLOGY OFFICE NOTE  Date:  06/11/2015    Ryan [name] Date of Birth: October 15, 1941 Medical Record #[id number]  PCP:  Marton [place], MD  Cardiologist:  [name]    Chief Complaint  Patient presents with  . Post CABG    Post CABG - seen for Dr. Acie Fredrickson    History of Present Illness: Ryan [name] is a 74 y.o. male who presents today for a post hospital visit. Seen for Dr. Acie Fredrickson. He has known CAD with prior MIs. Multiple cardiac risk factors - HTN and HLD including PVD and extracranial cerebrovascular occlusive disease.   2 weeks prior to his last admission in April he was admitted with chest pain and shortness of breath - thought to have had PE - VQ with intermediate probability and was started on Xarelto. Readmitted with chest pain - troponin elevated. CT with no evidence of PE. Lower extremity duplex negative for DVT. Underwent cath and this revealed chronic occlusion of the RCA and circumflex as well as progression in the LAD disease. Due to these findings cardiothoracic surgical consultation was obtained with Modesto Charon M.D. The patient and his studies were evaluated and recommendations were made for coronary artery bypass grafting. A vascular surgery consultation was obtained preoperatively by Dr. Adele Barthel who recommended non-concurrent procedures for symptomatic left internal carotid artery stenosis. The patient is scheduled to be seen by Dr. Kellie Simmering in July and consideration of cardiac endarterectomy will be made at that time. The patient was felt to be stable to proceed with the cardiac procedure and on 04/05/2015 he was taken the operating room. Post op course was eventful for AF - started on amiodarone. EF noted to be 60% at time of his cath.   Comes in today. Here with his wife. Doing ok. He is very upset that his "symptoms were missed". Says he saw so many doctors. Says he is now doing fine. He is almost 2 months out from his surgery. Tells me he has stopped  most of his medicines. This includes Xarelto, amiodarone and aspirin. He says he thought he did not need any of them. Tells me he never had a blood clot and this was his heart acting up the whole time. No palpitations. Not interested in cardiac rehab - says he was told he did not need to go there. Not short of breath. Has no real complaint or concern except notes that he will have a transient dizziness. No syncope.    Past Medical History  Diagnosis Date  . Arthritis   . Hyperlipidemia     takes Crestor daily  . Peripheral vascular disease   . Carotid artery occlusion   . Hypertension     takes Amlodipine daily  . Chronic back pain   . History of gout     has colchicine prn  . History of colon polyps   . Urinary frequency   . Urinary urgency   . Enlarged prostate     takes Rapaflo daily  . History of kidney stones   . Hyperthyroidism     takes SYnthroid daily as instructed  . Cataract     Bil eyes/worse in left eye  . GERD (gastroesophageal reflux disease)     occasional  . Pulmonary emboli 03/20/2015    elevated d-dimer, intermediate V/Q study, atypical chest pain and SOB. Start on Xarelto 20mg  BID for 3 month  . Renal insufficiency     Past Surgical History  Procedure Laterality Date  .  Femoral artery - popliteal artery bypass graft    . Joint replacement      shoulder  . Back surgery      5 times  . Lumbar laminectomy/decompression microdiscectomy  02/12/2012    Procedure: LUMBAR LAMINECTOMY/DECOMPRESSION MICRODISCECTOMY;  Surgeon: Floyce Stakes, MD;  Location: [facility];  Service: Neurosurgery;  Laterality: N/A;  Lumbar four-five laminectomy  . Appendectomy    . Cataract surgery      left eye  . Colonoscopy    . Big toe surgery    . Cystoscopy    . Lumbar laminectomy  01/06/2013    Procedure: MICRODISCECTOMY LUMBAR LAMINECTOMY;  Surgeon: Marybelle Killings, MD;  Location: [facility];  Service: Orthopedics;  Laterality: N/A;  L3-4 decompression  . Cardiac catheterization       2010    dr Acie Fredrickson  . Eye surgery    . Total hip arthroplasty Left 01/09/2014    DR Ninfa Linden  . Total hip arthroplasty Left 01/09/2014    Procedure: LEFT TOTAL HIP ARTHROPLASTY ANTERIOR APPROACH and Steroid Injection Right hip;  Surgeon: Mcarthur Rossetti, MD;  Location: [facility];  Service: Orthopedics;  Laterality: Left;  . Steriod injection Right 01/09/2014    Procedure: STEROID INJECTION;  Surgeon: Mcarthur Rossetti, MD;  Location: [facility];  Service: Orthopedics;  Laterality: Right;  . Left heart catheterization with coronary angiogram N/A 04/03/2015    Procedure: LEFT HEART CATHETERIZATION WITH CORONARY ANGIOGRAM;  Surgeon: Troy Sine, MD;  Location: [facility];  Service: Cardiovascular;  Laterality: N/A;  . Coronary artery bypass graft N/A 04/05/2015    Procedure: CORONARY ARTERY BYPASS GRAFTING (CABG)X4 LIMA-LAD; SVG-DIAG1-DIAG2; SVG-PD;  Surgeon: [name], MD;  Location: [facility];  Service: Open Heart Surgery;  Laterality: N/A;  . Tee without cardioversion N/A 04/05/2015    Procedure: TRANSESOPHAGEAL ECHOCARDIOGRAM (TEE);  Surgeon: [name], MD;  Location: [facility];  Service: Open Heart Surgery;  Laterality: N/A;     Medications: Current Outpatient Prescriptions  Medication Sig Dispense Refill  . lisinopril (PRINIVIL,ZESTRIL) 2.5 MG tablet Take 1 tablet (2.5 mg total) by mouth daily. 30 tablet 1  . Metoprolol Tartrate 37.5 MG TABS Take 37.5 mg by mouth 2 (two) times daily. 60 tablet 1  . rosuvastatin (CRESTOR) 20 MG tablet Take 0.5 tablets (10 mg total) by mouth daily. 90 tablet 3  . silodosin (RAPAFLO) 8 MG CAPS capsule Take 8 mg by mouth at bedtime.      No current facility-administered medications for this visit.    Allergies: Allergies  Allergen Reactions  . Codeine Nausea And Vomiting         Social History: The patient  reports that he quit smoking about 28 years ago. His smoking use included Cigarettes. He has quit using smokeless tobacco.  His smokeless tobacco use included Chew. He reports that he does not drink alcohol or use illicit drugs.   Family History: The patient's family history includes Diabetes in his son; Heart attack in his father; Heart disease in his father and sister; Hypertension in his mother.   Review of Systems: Please see the history of present illness.   Otherwise, the review of systems is positive for none.   All other systems are reviewed and negative.   Physical Exam: VS:  BP 124/66 mmHg  Pulse 79  Ht 5\' 6"  (1.676 m)  Wt 171 lb 6.4 oz (77.747 kg)  BMI 27.68 kg/m2 .  BMI Body mass index is 27.68 kg/(m^2).  Wt Readings from Last 3 Encounters:  06/11/15 171 lb 6.4 oz (77.747 kg)  05/07/15 172 lb (78.019 kg)  04/15/15 183 lb (83.008 kg)    General: Pleasant. Well developed, well nourished and in no acute distress.  HEENT: Normal. Neck: Supple, no JVD, carotid bruits, or masses noted.  Cardiac: Regular rate and rhythm. No murmurs, rubs, or gallops. No edema. Sternum looks ok.  Respiratory:  Lungs are clear to auscultation bilaterally with normal work of breathing.  GI: Soft and nontender.  MS: No deformity or atrophy. Gait and ROM intact. Skin: Warm and dry. Color is normal.  Neuro:  Strength and sensation are intact and no gross focal deficits noted.  Psych: Alert, appropriate and with normal affect.   LABORATORY DATA:  EKG:  EKG is ordered today. This demonstrates NSR.  Lab Results  Component Value Date   WBC 14.2* 04/08/2015   HGB 11.1* 04/08/2015   HCT 33.2* 04/08/2015   PLT 188 04/08/2015   GLUCOSE 122* 06/04/2015   CHOL 164 06/04/2015   TRIG 138.0 06/04/2015   HDL 37.30* 06/04/2015   LDLCALC 99 06/04/2015   ALT 8 06/04/2015   AST 13 06/04/2015   NA 137 06/04/2015   K 4.1 06/04/2015   CL 105 06/04/2015   CREATININE 1.19 06/04/2015   BUN 15 06/04/2015   CO2 27 06/04/2015   TSH 11.564* 04/09/2015   INR 1.36 04/05/2015   HGBA1C 6.4* 03/20/2015    BNP (last 3  results)  Recent Labs  04/01/15 0859  BNP 359.1*    ProBNP (last 3 results) No results for input(s): PROBNP in the last 8760 hours.   Other Studies Reviewed Today:   Assessment/Plan: 1. NSTEMI with coronary artery bypass grafting x4 (left internal mammary artery to left anterior descending, sequential saphenous vein graft to diagonal 1 A and B, saphenous vein graft to posterior descending) - he is doing well. Already back to all of his usual activities. Not interested in cardiac rehab. Encouraged walking program of 45 minutes per day. Restart aspirin. Encouraged him to call here before stopping any more medicines.   2. HTN - BP ok on current regimen  3. Post op atrial fib - in sinus - stopped his amiodarone  4. ?PE - sounds like this was not the case. Negative CT scan noted. Will restart his aspirin  5. HLD - recent labs noted - continue statin  6. Elevated TSH - needs to be rechecked. PCP is [name].   7. Carotid disease - to see Dr. Kellie Simmering in July for surgical consult.   Current medicines are reviewed with the patient today.  The patient does not have concerns regarding medicines other than what has been noted above.  The following changes have been made:  See above.  Labs/ tests ordered today include:   No orders of the defined types were placed in this encounter.     Disposition:   FU with Dr. Acie Fredrickson in 3 months.   Patient is agreeable to this plan and will call if any problems develop in the interim.   Signed: Burtis Junes, RN, ANP-C 06/11/2015 8:15 AM  [facility] [address] [place], [place]  [address] Phone: [phone] Fax: [phone]

## 2015-07-05 ENCOUNTER — Encounter: Payer: Self-pay | Admitting: Vascular Surgery

## 2015-07-09 ENCOUNTER — Ambulatory Visit (INDEPENDENT_AMBULATORY_CARE_PROVIDER_SITE_OTHER): Payer: Medicare Other | Admitting: Vascular Surgery

## 2015-07-09 ENCOUNTER — Other Ambulatory Visit: Payer: Self-pay | Admitting: Family

## 2015-07-09 ENCOUNTER — Other Ambulatory Visit: Payer: Self-pay | Admitting: Vascular Surgery

## 2015-07-09 ENCOUNTER — Ambulatory Visit (HOSPITAL_COMMUNITY)
Admission: RE | Admit: 2015-07-09 | Discharge: 2015-07-09 | Disposition: A | Discharge status: Home / Self Care | Payer: Medicare Other | Source: Ambulatory Visit | Attending: Vascular Surgery | Admitting: Vascular Surgery

## 2015-07-09 ENCOUNTER — Encounter: Payer: Self-pay | Admitting: Vascular Surgery

## 2015-07-09 VITALS — BP 129/79 | HR 61 | Ht 66.0 in | Wt 174.3 lb

## 2015-07-09 DIAGNOSIS — I6522 Occlusion and stenosis of left carotid artery: Secondary | ICD-10-CM | POA: Diagnosis not present

## 2015-07-09 DIAGNOSIS — I739 Peripheral vascular disease, unspecified: Secondary | ICD-10-CM | POA: Diagnosis not present

## 2015-07-09 DIAGNOSIS — I6523 Occlusion and stenosis of bilateral carotid arteries: Secondary | ICD-10-CM | POA: Diagnosis not present

## 2015-07-09 DIAGNOSIS — I200 Unstable angina: Secondary | ICD-10-CM | POA: Diagnosis not present

## 2015-07-09 DIAGNOSIS — I6529 Occlusion and stenosis of unspecified carotid artery: Secondary | ICD-10-CM

## 2015-07-09 NOTE — Progress Notes (Signed)
Subjective:     Patient ID: Ryan [name], male   DOB: [birth year], 73 y.o.   MRN: [id number]  HPI This 74 year old male returns for continued follow-up regarding his carotid occlusive disease and lower extremity occlusive disease. He previously underwent aortobifemoral bypass grafting by me in the 1980s. He has had some recurrent hip claudication and had hip replacement surgery which did not improve O symptoms. We have followed him for a 70% left ICA stenosis which remains asymptomatic. He denies any lateralizing weakness, aphasia, amaurosis fugax, diplopia, blurred vision, or syncope. Patient did have coronary artery bypass grafting in the past 6 months by Dr. Merilynn Finland and is doing well from that standpoint. He takes one aspirin per day.  Past Medical History  Diagnosis Date  . Arthritis   . Hyperlipidemia     takes Crestor daily  . Peripheral vascular disease   . Carotid artery occlusion   . Hypertension     takes Amlodipine daily  . Chronic back pain   . History of gout     has colchicine prn  . History of colon polyps   . Urinary frequency   . Urinary urgency   . Enlarged prostate     takes Rapaflo daily  . History of kidney stones   . Hyperthyroidism     takes SYnthroid daily as instructed  . Cataract     Bil eyes/worse in left eye  . GERD (gastroesophageal reflux disease)     occasional  . Pulmonary emboli 03/20/2015    elevated d-dimer, intermediate V/Q study, atypical chest pain and SOB. Start on Xarelto 20mg  BID for 3 month  . Renal insufficiency   . Myocardial infarction   . DVT (deep venous thrombosis)   . CAD (coronary artery disease)   . CHF (congestive heart failure)     History  Substance Use Topics  . Smoking status: Former Smoker    Types: Cigarettes    Quit date: 02/04/1987  . Smokeless tobacco: Former Systems developer    Types: Chew     Comment: quit 35+yrs ago  . Alcohol Use: No    Family History  Problem Relation Age of Onset  . Heart disease  Father   . Heart attack Father   . Heart disease Sister   . Hypertension Sister   . Heart attack Sister   . Hypertension Mother   . Diabetes Son     Allergies  Allergen Reactions  . Codeine Nausea And Vomiting          Current outpatient prescriptions:  .  amLODipine (NORVASC) 5 MG tablet, Take 5 mg by mouth daily., Disp: , Rfl:  .  aspirin 81 MG chewable tablet, Chew 1 tablet (81 mg total) by mouth daily., Disp: , Rfl:  .  docusate sodium (COLACE) 100 MG capsule, Take 100 mg by mouth daily., Disp: , Rfl:  .  levothyroxine (SYNTHROID, LEVOTHROID) 25 MCG tablet, Take 1 tablet (25 mcg total) by mouth daily before breakfast., Disp: 30 tablet, Rfl: 11 .  pravastatin (PRAVACHOL) 20 MG tablet, Take 20 mg by mouth daily., Disp: , Rfl:  .  silodosin (RAPAFLO) 8 MG CAPS capsule, Take 8 mg by mouth at bedtime. , Disp: , Rfl:  .  lisinopril (PRINIVIL,ZESTRIL) 2.5 MG tablet, Take 1 tablet (2.5 mg total) by mouth daily. (Patient not taking: Reported on 07/09/2015), Disp: 30 tablet, Rfl: 1 .  Metoprolol Tartrate 37.5 MG TABS, Take 37.5 mg by mouth 2 (two) times daily. (Patient not taking:  Reported on 07/09/2015), Disp: 60 tablet, Rfl: 1 .  rosuvastatin (CRESTOR) 20 MG tablet, Take 0.5 tablets (10 mg total) by mouth daily. (Patient not taking: Reported on 07/09/2015), Disp: 90 tablet, Rfl: 3  Filed Vitals:   07/09/15 0954 07/09/15 0955  BP: 147/70 129/79  Pulse: 61   Height: 5\' 6"  (1.676 m)   Weight: 174 lb 4.8 oz (79.062 kg)   SpO2: 98%     Body mass index is 28.15 kg/(m^2).           Review of Systems Complains of bilateral hip discomfort with ambulation of several blocks. Denies chest pain, dyspnea on exertion, PND, orthopnea. Does have history of left hip replacement surgery. All other systems negative and complete review of systems     Objective:   Physical Exam BP 129/79 mmHg  Pulse 61  Ht 5\' 6"  (1.676 m)  Wt 174 lb 4.8 oz (79.062 kg)  BMI 28.15 kg/m2  SpO2  98%  Gen.-alert and oriented x3 in no apparent distress HEENT normal for age Lungs no rhonchi or wheezing Cardiovascular regular rhythm no murmurs carotid pulses 3+ palpable  -soft left carotid bruit Abdomen soft nontender no palpable masses Musculoskeletal free of  major deformities Skin clear -no rashes Neurologic normal Lower extremities 3+ femoral and dorsalis pedis pulses palpable bilaterally with no edema   today I ordered a carotid duplex exam which I reviewed and interpreted and also compared this to the previous study performed in October 2015. Studies are essentially unchanged with a 70% left ICA stenosis with velocities very similar to previous examination.       Assessment:      70% left ICA stenosis-asymptomatic and stable  Coronary artery disease status post coronary artery bypass grafting in the past 6 months-doing well Hyperlipidemiacurrently being treated Hypertension  Degenerative joint disease with previous left hip replacement History of aortobifemoral bypass grafting in the 1980s doing well from that standpoint with stable hip claudication     Plan:      return in 9 months with carotid duplex exam for continued follow-up of 70% left ICA stenosis If patient develops any symptoms he will be in touch with [place]

## 2015-07-09 NOTE — Addendum Note (Signed)
Addended by: Dorthula Rue L on: 07/09/2015 03:06 PM   Modules accepted: Orders

## 2015-07-18 ENCOUNTER — Encounter: Payer: Self-pay | Admitting: Gastroenterology

## 2015-08-05 ENCOUNTER — Telehealth: Payer: Self-pay | Admitting: Cardiovascular Disease

## 2015-08-05 NOTE — Telephone Encounter (Signed)
Spoke with patient who states he has an occasional sharp pain across his chest that lasts for a few seconds then goes away; states first episode occurred last night and second occurred this morning.  He states pain does not occur with exertion but occurs randomly.  He states he feels good now, thinks his symptoms were related to the food he ate.  States he ate [place] food last night when he noticed the first episode.  He denies SOB, n/v, diaphoresis with these events.  I advised him to take an OTC antacid to see if it helps his symptoms and to call back if pain persists or worsens.  He verbalized understanding and agreement and will call back to schedule an appointment with Dr. Acie Fredrickson if needed; he is aware that Dr. Acie Fredrickson is in the [facility] office tomorrow, Wednesday and Thursday.

## 2015-08-05 NOTE — Telephone Encounter (Signed)
Pt c/o of Chest Pain: STAT if CP now or developed within 24 hours   1. Are you having CP right now? No.. The last time was this morning around 8am. It woke him up through the night.  2. Are you experiencing any other symptoms (ex. SOB, nausea, vomiting, sweating)? Pt wife is not sure.   3. How long have you been experiencing CP? 1 week or longer.   4. Is your CP continuous or coming and going? Coming and Going   5. Have you taken Nitroglycerin? No. He hasn't had to take it...  ? Comments: Wife called.. Request that you call the pt back

## 2015-08-08 DIAGNOSIS — I10 Essential (primary) hypertension: Secondary | ICD-10-CM | POA: Diagnosis not present

## 2015-08-08 DIAGNOSIS — Z125 Encounter for screening for malignant neoplasm of prostate: Secondary | ICD-10-CM | POA: Diagnosis not present

## 2015-08-08 DIAGNOSIS — E039 Hypothyroidism, unspecified: Secondary | ICD-10-CM | POA: Diagnosis not present

## 2015-08-08 DIAGNOSIS — E785 Hyperlipidemia, unspecified: Secondary | ICD-10-CM | POA: Diagnosis not present

## 2015-08-08 DIAGNOSIS — R7301 Impaired fasting glucose: Secondary | ICD-10-CM | POA: Diagnosis not present

## 2015-08-12 ENCOUNTER — Telehealth: Payer: Self-pay | Admitting: Cardiovascular Disease

## 2015-08-12 NOTE — Telephone Encounter (Signed)
We can see what the ECG looks like on thursday with [name]

## 2015-08-12 NOTE — Telephone Encounter (Signed)
New message    Pt has question about medication Pt had stopped taking amiodarone and pt wants to know if he can start taking medication again Please call to discuss

## 2015-08-12 NOTE — Telephone Encounter (Signed)
Spoke with patient who states he stopped taking his Amiodarone on his own accord prior to office visit with Ryan Merle, NP on 06/11/15; he states she advised him not to stop taking medications without advice from his health care provider but she did not tell him to resume.  He called to ask if he should resume it now.  He states he does not know if he is in a fib but he also did not know when he was out of rhythm in the past.  He states he is concerned that the sharp pains he has been experiencing could be related to not taking the Amiodarone.  He states he feels fine today, states has not had any "gas pains" since Friday.  I spoke with him last Monday about these intermittent sharp pains that he correlated with his diet.  I advised him that an ekg is needed to determine his heart rhythm before resuming Amiodarone.  I offered a nurse visit today.  Patient states he has an appointment on Thursday with his PCP, Dr. Brigitte [name] and asks if he could do the ekg and make the recommendation regarding Amiodarone.  I told him he could talk with Dr. Brigitte [name] about this.  I advised that I will route message to Dr. Acie [name] for advice and will notify the patient if he has any additional recommendations.  Patient verbalized understanding and agreement.

## 2015-08-15 DIAGNOSIS — Z6829 Body mass index (BMI) 29.0-29.9, adult: Secondary | ICD-10-CM | POA: Diagnosis not present

## 2015-08-15 DIAGNOSIS — R7301 Impaired fasting glucose: Secondary | ICD-10-CM | POA: Diagnosis not present

## 2015-08-15 DIAGNOSIS — Z1389 Encounter for screening for other disorder: Secondary | ICD-10-CM | POA: Diagnosis not present

## 2015-08-15 DIAGNOSIS — Z0000 Encounter for general adult medical examination without abnormal findings: Secondary | ICD-10-CM | POA: Diagnosis not present

## 2015-08-15 DIAGNOSIS — I25810 Atherosclerosis of coronary artery bypass graft(s) without angina pectoris: Secondary | ICD-10-CM | POA: Diagnosis not present

## 2015-08-15 DIAGNOSIS — M25519 Pain in unspecified shoulder: Secondary | ICD-10-CM | POA: Diagnosis not present

## 2015-08-15 DIAGNOSIS — K5909 Other constipation: Secondary | ICD-10-CM | POA: Diagnosis not present

## 2015-08-15 DIAGNOSIS — I10 Essential (primary) hypertension: Secondary | ICD-10-CM | POA: Diagnosis not present

## 2015-08-15 DIAGNOSIS — E039 Hypothyroidism, unspecified: Secondary | ICD-10-CM | POA: Diagnosis not present

## 2015-08-15 DIAGNOSIS — I739 Peripheral vascular disease, unspecified: Secondary | ICD-10-CM | POA: Diagnosis not present

## 2015-08-15 DIAGNOSIS — I6529 Occlusion and stenosis of unspecified carotid artery: Secondary | ICD-10-CM | POA: Diagnosis not present

## 2015-08-15 DIAGNOSIS — E785 Hyperlipidemia, unspecified: Secondary | ICD-10-CM | POA: Diagnosis not present

## 2015-08-15 NOTE — Telephone Encounter (Signed)
Spoke with patient who states he saw [name] today and reports per [name] his heart was in normal rhythm and not to restart Amiodarone.  He states [name] thought pains in chest were gas pains and that he changed one of his BP medications because he thought it might be causing him some problems.  The patient does not know which medications were changed, but state [name] was going to send Dr. Acie Fredrickson a report. I advised him to call back with questions or concerns.  He thanked me for the call.

## 2015-08-19 DIAGNOSIS — Z1212 Encounter for screening for malignant neoplasm of rectum: Secondary | ICD-10-CM | POA: Diagnosis not present

## 2015-08-23 ENCOUNTER — Encounter: Payer: Self-pay | Admitting: Cardiovascular Disease

## 2015-08-28 DIAGNOSIS — M25512 Pain in left shoulder: Secondary | ICD-10-CM | POA: Diagnosis not present

## 2015-09-03 ENCOUNTER — Ambulatory Visit: Payer: Medicare Other | Admitting: Cardiovascular Disease

## 2015-09-03 ENCOUNTER — Telehealth: Payer: Self-pay | Admitting: Nurse Practitioner

## 2015-09-03 DIAGNOSIS — E785 Hyperlipidemia, unspecified: Secondary | ICD-10-CM

## 2015-09-03 MED ORDER — EZETIMIBE 10 MG PO TABS: 10.0000 mg | ORAL_TABLET | Freq: Every day | ORAL | Status: DC

## 2015-09-03 NOTE — Telephone Encounter (Signed)
Reviewed lab results with patient who verbalized understanding and agreement to start Zetia 10 mg and continue Crestor 10 mg once daily.  He will need repeat fasting lab work in 3 months.  He requests a call back closer to that time to schedule lab appointment.  Orders in epic.

## 2015-09-03 NOTE — Telephone Encounter (Signed)
-----   Message from Thayer Headings, MD sent at 09/03/2015  4:59 PM EDT ----- Pt's lipids are still too high.  Only tolerates crestor 10 mg a day  Will add Zetia 10 mg a day to his medicatios

## 2015-09-24 ENCOUNTER — Ambulatory Visit (INDEPENDENT_AMBULATORY_CARE_PROVIDER_SITE_OTHER): Payer: Medicare Other | Admitting: Cardiovascular Disease

## 2015-09-24 ENCOUNTER — Encounter: Payer: Self-pay | Admitting: Cardiovascular Disease

## 2015-09-24 VITALS — BP 140/70 | HR 80 | Ht 66.0 in | Wt 177.4 lb

## 2015-09-24 DIAGNOSIS — I2510 Atherosclerotic heart disease of native coronary artery without angina pectoris: Secondary | ICD-10-CM

## 2015-09-24 DIAGNOSIS — I6522 Occlusion and stenosis of left carotid artery: Secondary | ICD-10-CM

## 2015-09-24 DIAGNOSIS — I200 Unstable angina: Secondary | ICD-10-CM | POA: Diagnosis not present

## 2015-09-24 NOTE — Patient Instructions (Addendum)
Medication Instructions:  Your physician recommends that you continue on your current medications as directed. Please refer to the Current Medication list given to you today.   Labwork: Your physician recommends that you return for lab work on 12/15 at 8:15 am (cholesterol, liver, basic metabolic panel)  You will need to FAST for this appointment - nothing to eat or drink after midnight the night before except water.    Testing/Procedures: None Ordered    Follow-Up: Your physician wants you to follow-up in: 6 months with Dr. Acie Fredrickson.  You will receive a reminder letter in the mail two months in advance. If you don't receive a letter, please call our office to schedule the follow-up appointment.   Low-Sodium Eating Plan    Sodium raises blood pressure and causes water to be held in the body. Getting less sodium from food will help lower your blood pressure, reduce any swelling, and protect your heart, liver, and kidneys. We get sodium by adding salt (sodium chloride) to food. Most of our sodium comes from canned, boxed, and frozen foods. Restaurant foods, fast foods, and pizza are also very high in sodium. Even if you take medicine to lower your blood pressure or to reduce fluid in your body, getting less sodium from your food is important.  WHAT IS MY PLAN?  Most people should limit their sodium intake to 2,300 mg a day. Your health care provider recommends that you limit your sodium intake to __________ a day.  WHAT DO I NEED TO KNOW ABOUT THIS EATING PLAN?  For the low-sodium eating plan, you will follow these general guidelines:  Choose foods with a % Daily Value for sodium of less than 5% (as listed on the food label).  Use salt-free seasonings or herbs instead of table salt or sea salt.  Check with your health care provider or pharmacist before using salt substitutes.  Eat fresh foods.  Eat more vegetables and fruits.  Limit canned vegetables. If you do use them, rinse them well  to decrease the sodium.  Limit cheese to 1 oz (28 g) per day.  Eat lower-sodium products, often labeled as "lower sodium" or "no salt added."  Avoid foods that contain monosodium glutamate (MSG). MSG is sometimes added to [place] food and some canned foods.  Check food labels (Nutrition Facts labels) on foods to learn how much sodium is in one serving.  Eat more home-cooked food and less restaurant, buffet, and fast food.  When eating at a restaurant, ask that your food be prepared with less salt or none, if possible.  HOW DO I READ FOOD LABELS FOR SODIUM INFORMATION?  The Nutrition Facts label lists the amount of sodium in one serving of the food. If you eat more than one serving, you must multiply the listed amount of sodium by the number of servings.  Food labels may also identify foods as:  Sodium free--Less than 5 mg in a serving.  Very low sodium--35 mg or less in a serving.  Low sodium--140 mg or less in a serving.  Light in sodium--50% less sodium in a serving. For example, if a food that usually has 300 mg of sodium is changed to become light in sodium, it will have 150 mg of sodium.  Reduced sodium--25% less sodium in a serving. For example, if a food that usually has 400 mg of sodium is changed to reduced sodium, it will have 300 mg of sodium. WHAT FOODS CAN I EAT?  Grains  Low-sodium cereals,  including oats, puffed wheat and rice, and shredded wheat cereals. Low-sodium crackers. Unsalted rice and pasta. Lower-sodium bread.  Vegetables  Frozen or fresh vegetables. Low-sodium or reduced-sodium canned vegetables. Low-sodium or reduced-sodium tomato sauce and paste. Low-sodium or reduced-sodium tomato and vegetable juices.  Fruits  Fresh, frozen, and canned fruit. Fruit juice.  Meat and Other Protein Products  Low-sodium canned tuna and salmon. Fresh or frozen meat, poultry, seafood, and fish. Lamb. Unsalted nuts. Dried beans, peas, and lentils without added salt. Unsalted canned  beans. Homemade soups without salt. Eggs.  Dairy  Milk. Soy milk. Ricotta cheese. Low-sodium or reduced-sodium cheeses. Yogurt.  Condiments  Fresh and dried herbs and spices. Salt-free seasonings. Onion and garlic powders. Low-sodium varieties of mustard and ketchup. Lemon juice.  Fats and Oils  Reduced-sodium salad dressings. Unsalted butter.  Other  Unsalted popcorn and pretzels.  The items listed above may not be a complete list of recommended foods or beverages. Contact your dietitian for more options.  WHAT FOODS ARE NOT RECOMMENDED?  Grains  Instant hot cereals. Bread stuffing, pancake, and biscuit mixes. Croutons. Seasoned rice or pasta mixes. Noodle soup cups. Boxed or frozen macaroni and cheese. Self-rising flour. Regular salted crackers.  Vegetables  Regular canned vegetables. Regular canned tomato sauce and paste. Regular tomato and vegetable juices. Frozen vegetables in sauces. Salted french fries. Olives. Angie Fava. Relishes. Sauerkraut. Salsa.  Meat and Other Protein Products  Salted, canned, smoked, spiced, or pickled meats, seafood, or fish. Bacon, ham, sausage, hot dogs, corned beef, chipped beef, and packaged luncheon meats. Salt pork. Jerky. Pickled herring. Anchovies, regular canned tuna, and sardines. Salted nuts.  Dairy  Processed cheese and cheese spreads. Cheese curds. Blue cheese and cottage cheese. Buttermilk.  Condiments  Onion and garlic salt, seasoned salt, table salt, and sea salt. Canned and packaged gravies. Worcestershire sauce. Tartar sauce. Barbecue sauce. Teriyaki sauce. Soy sauce, including reduced sodium. Steak sauce. Fish sauce. Oyster sauce. Cocktail sauce. Horseradish. Regular ketchup and mustard. Meat flavorings and tenderizers. Bouillon cubes. Hot sauce. Tabasco sauce. Marinades. Taco seasonings. Relishes.  Fats and Oils  Regular salad dressings. Salted butter. Margarine. Ghee. Bacon fat.  Other  Potato and tortilla chips. Corn chips and puffs. Salted  popcorn and pretzels. Canned or dried soups. Pizza. Frozen entrees and pot pies.  The items listed above may not be a complete list of foods and beverages to avoid. Contact your dietitian for more information.  Document Released: 05/29/2002 Document Revised: 12/12/2013 Document Reviewed: 10/11/2013  [address] [facility] Patient Information 2015 [place], [place]. This information is not intended to replace advice given to you by your health care provider. Make sure you discuss any questions you have with your health care provider.

## 2015-09-24 NOTE — Progress Notes (Signed)
   Problem List  1. CAD - CABG  2. Essential Hypertension 3. Hyperlipidemia   Ryan [name] is a 74 y.o. man  With a history of hyperlipidemia, coronary artery disease, hypothyroidism    Oct. 4, 2016:  Ryan [name] is doing well.  Occasional episodes of orthostatic hypotension Is eating some salty snacks. Has lots of hip pain with walking  Encouraged him to try a stationary bike or elliptical  Had peripheral arterial duplex scan ( ? At [facility]) that was reportedly normal.   Objective: Vital signs in last 24 hours: Temp:  [97.5 F (36.4 C)-97.9 F (36.6 C)] 97.9 F (36.6 C) (04/14 0435) Pulse Rate:  [67-81] 74 (04/14 0435) Resp:  [18] 18 (04/14 0435) BP: (105-144)/(52-77) 123/68 mmHg (04/14 0435) SpO2:  [95 %-97 %] 96 % (04/14 0435)    Medications Current Outpatient Prescriptions  Medication Sig Dispense Refill  . amLODipine (NORVASC) 5 MG tablet Take 5 mg by mouth daily.    Marland Kitchen aspirin 81 MG chewable tablet Chew 1 tablet (81 mg total) by mouth daily.    . diclofenac (VOLTAREN) 75 MG EC tablet Take 75 mg by mouth 2 (two) times daily.    Marland Kitchen docusate sodium (COLACE) 100 MG capsule Take 100 mg by mouth daily.    Marland Kitchen ezetimibe (ZETIA) 10 MG tablet Take 1 tablet (10 mg total) by mouth daily. 31 tablet 11  . levothyroxine (SYNTHROID, LEVOTHROID) 25 MCG tablet Take 1 tablet (25 mcg total) by mouth daily before breakfast. 30 tablet 11  . pravastatin (PRAVACHOL) 20 MG tablet Take 20 mg by mouth daily.    . silodosin (RAPAFLO) 8 MG CAPS capsule Take 8 mg by mouth at bedtime.     . valsartan (DIOVAN) 160 MG tablet Take 160 mg by mouth daily.     No current facility-administered medications for this visit.    PE: BP 150/68 mmHg  Pulse 80  Ht 5\' 6"  (1.676 m)  Wt 80.468 kg (177 lb 6.4 oz)  BMI 28.65 kg/m2  SpO2 98% HEENT:   No JVD, soft left carotid bruit Lungs: clear  Cor:  RR Abd:  + BS ,  Ext , no clubbing cyanosis , no edema,  Skin , normal Psych:  Normal ,   Neuro: CN  intact   Lab Results:  No results for input(s): WBC, HGB, HCT, PLT in the last 72 hours. BMET No results for input(s): NA, K, CL, CO2, GLUCOSE, BUN, CREATININE, CALCIUM in the last 72 hours.  Assessment/Plan    1. CAD: severe multivessel disease. CABG    he's doing well. He's not had any further episodes of chest discomfort. He had his labs checked several weeks ago with [name]. Will see him again in 6 months and recheck fasting labs    2. Hyperlipidemia - we added Zetia to his Crestor in Sept.  Will check labs in Dec.   3. Hypothyroidism  4.  Essential hypertension-  Blood pressure is a little elevated. I've encouraged him to decrease his salt intake. He will record his BP on occasion      Ramond Dial., MD, [facility] 09/24/2015, 1:52 PM [address]  [place] Pager [phone]

## 2015-10-28 DIAGNOSIS — I25810 Atherosclerosis of coronary artery bypass graft(s) without angina pectoris: Secondary | ICD-10-CM | POA: Diagnosis not present

## 2015-10-28 DIAGNOSIS — R7301 Impaired fasting glucose: Secondary | ICD-10-CM | POA: Diagnosis not present

## 2015-10-28 DIAGNOSIS — I10 Essential (primary) hypertension: Secondary | ICD-10-CM | POA: Diagnosis not present

## 2015-10-28 DIAGNOSIS — Z6829 Body mass index (BMI) 29.0-29.9, adult: Secondary | ICD-10-CM | POA: Diagnosis not present

## 2015-10-28 DIAGNOSIS — E785 Hyperlipidemia, unspecified: Secondary | ICD-10-CM | POA: Diagnosis not present

## 2015-10-28 DIAGNOSIS — I499 Cardiac arrhythmia, unspecified: Secondary | ICD-10-CM | POA: Diagnosis not present

## 2015-10-28 DIAGNOSIS — I6529 Occlusion and stenosis of unspecified carotid artery: Secondary | ICD-10-CM | POA: Diagnosis not present

## 2015-10-28 DIAGNOSIS — E039 Hypothyroidism, unspecified: Secondary | ICD-10-CM | POA: Diagnosis not present

## 2015-12-05 ENCOUNTER — Other Ambulatory Visit: Payer: Medicare Other

## 2015-12-13 DIAGNOSIS — J209 Acute bronchitis, unspecified: Secondary | ICD-10-CM | POA: Diagnosis not present

## 2015-12-13 DIAGNOSIS — Z6829 Body mass index (BMI) 29.0-29.9, adult: Secondary | ICD-10-CM | POA: Diagnosis not present

## 2015-12-13 DIAGNOSIS — R05 Cough: Secondary | ICD-10-CM | POA: Diagnosis not present

## 2015-12-19 ENCOUNTER — Other Ambulatory Visit: Payer: Medicare Other

## 2015-12-24 ENCOUNTER — Other Ambulatory Visit: Payer: Medicare Other

## 2015-12-26 ENCOUNTER — Other Ambulatory Visit (INDEPENDENT_AMBULATORY_CARE_PROVIDER_SITE_OTHER): Payer: Medicare Other | Admitting: *Deleted

## 2015-12-26 DIAGNOSIS — I2510 Atherosclerotic heart disease of native coronary artery without angina pectoris: Secondary | ICD-10-CM | POA: Diagnosis not present

## 2015-12-26 DIAGNOSIS — I10 Essential (primary) hypertension: Secondary | ICD-10-CM | POA: Diagnosis not present

## 2015-12-26 DIAGNOSIS — I70211 Atherosclerosis of native arteries of extremities with intermittent claudication, right leg: Secondary | ICD-10-CM

## 2015-12-26 DIAGNOSIS — I2583 Coronary atherosclerosis due to lipid rich plaque: Secondary | ICD-10-CM | POA: Diagnosis not present

## 2015-12-26 DIAGNOSIS — E785 Hyperlipidemia, unspecified: Secondary | ICD-10-CM

## 2015-12-26 LAB — HEPATIC FUNCTION PANEL
ALBUMIN: 3.7 g/dL (ref 3.6–5.1)
ALK PHOS: 45 U/L (ref 40–115)
ALT: 8 U/L — ABNORMAL LOW (ref 9–46)
AST: 16 U/L (ref 10–35)
Bilirubin, Direct: 0.1 mg/dL (ref <=0.2)
Indirect Bilirubin: 0.5 mg/dL (ref 0.2–1.2)
TOTAL PROTEIN: 6.6 g/dL (ref 6.1–8.1)
Total Bilirubin: 0.6 mg/dL (ref 0.2–1.2)

## 2015-12-26 LAB — LIPID PANEL
CHOL/HDL RATIO: 3.7 ratio (ref <=5.0)
Cholesterol: 145 mg/dL (ref 125–200)
HDL: 39 mg/dL — AB (ref >=40)
LDL CALC: 91 mg/dL (ref <130)
TRIGLYCERIDES: 77 mg/dL (ref <150)
VLDL: 15 mg/dL (ref <30)

## 2015-12-26 LAB — BASIC METABOLIC PANEL
BUN: 19 mg/dL (ref 7–25)
CHLORIDE: 105 mmol/L (ref 98–110)
CO2: 23 mmol/L (ref 20–31)
CREATININE: 1.04 mg/dL (ref 0.70–1.18)
Calcium: 9 mg/dL (ref 8.6–10.3)
Glucose, Bld: 112 mg/dL — ABNORMAL HIGH (ref 65–99)
POTASSIUM: 4.3 mmol/L (ref 3.5–5.3)
SODIUM: 138 mmol/L (ref 135–146)

## 2015-12-26 NOTE — Addendum Note (Signed)
Addended by: Eulis Foster on: 12/26/2015 08:12 AM   Modules accepted: Orders

## 2015-12-27 ENCOUNTER — Telehealth: Payer: Self-pay | Admitting: Nurse Practitioner

## 2015-12-27 MED ORDER — ROSUVASTATIN CALCIUM 20 MG PO TABS: 20.0000 mg | ORAL_TABLET | Freq: Every day | ORAL | Status: DC

## 2015-12-27 NOTE — Telephone Encounter (Signed)
Reviewed lab results with patient.  He states he did not take the Zetia for the month of December due to price.  He states he noticed while he was off the medication that the severe hip pain he had been having resolved.  He states the pain has returned since he restarted the Zetia.  We discussed options and past hx of medications for hyperlipidemia.  He states [name], his PCP, has had patient try various medications.  He does not remember the reason he stopped Crestor in the past but would like to try it again before scheduling appointment with our lipid clinic.  I discussed with Dr. Acie Fredrickson and he advised patient stop Pravastatin and Zetia and start Rosuvastatin 20 mg.  Patient verbalized understanding and agreement.  He is due for follow-up with Dr. Acie Fredrickson in April at which time we will need to check repeat fasting labs to check his progress.

## 2015-12-27 NOTE — Telephone Encounter (Signed)
-----   Message from Thayer Headings, MD sent at 12/26/2015  4:45 PM EST ----- =BMP is stable Liver eny are stable LDL chol is slightly higher than we want He is currently on Zetia and Pravachol 20 Please increase the pravachol to 80 mg a day Check fasting lipids, liver, bmp in 3 months

## 2016-03-16 ENCOUNTER — Telehealth: Payer: Self-pay | Admitting: Cardiovascular Disease

## 2016-03-16 NOTE — Telephone Encounter (Signed)
Spoke with patient who states approximately 3 weeks ago he started having bilateral hip, right leg, and back pain so bad that he could barely get out of bed; thinks it is related to crestor.  He states he stopped the crestor approximately 1 1/2 weeks ago and is feeling better.  He states he continues to have right rib pain that radiates up to his right shoulder blade.  He states on 2 separate occasions he has had a "sick" feeling, like he was going to throw up.  These episodes occurred 1 week apart after eating dinner and he does not think they are related to crestor.  He states he did not hurt in his chest at the time but might have noticed some discomfort; denies other symptoms.  I scheduled him to see Dr. Acie Fredrickson tomorrow as he is also due for his 6 month f/u.  He verbalized understanding and agreement and thanked me for the appointment.

## 2016-03-16 NOTE — Telephone Encounter (Signed)
Will discuss his statin therapy at [facility]

## 2016-03-16 NOTE — Telephone Encounter (Signed)
New Message  Pt requested to speak w. RN- did not specify nature of phone call. Please call back and discuss.   

## 2016-03-17 ENCOUNTER — Encounter: Payer: Self-pay | Admitting: Cardiovascular Disease

## 2016-03-17 ENCOUNTER — Ambulatory Visit (INDEPENDENT_AMBULATORY_CARE_PROVIDER_SITE_OTHER): Payer: Medicare Other | Admitting: Cardiovascular Disease

## 2016-03-17 VITALS — BP 150/80 | HR 88 | Ht 66.0 in | Wt 179.0 lb

## 2016-03-17 DIAGNOSIS — Z951 Presence of aortocoronary bypass graft: Secondary | ICD-10-CM | POA: Diagnosis not present

## 2016-03-17 DIAGNOSIS — I2510 Atherosclerotic heart disease of native coronary artery without angina pectoris: Secondary | ICD-10-CM | POA: Diagnosis not present

## 2016-03-17 DIAGNOSIS — I70211 Atherosclerosis of native arteries of extremities with intermittent claudication, right leg: Secondary | ICD-10-CM

## 2016-03-17 MED ORDER — CARVEDILOL 3.125 MG PO TABS: 3.1250 mg | ORAL_TABLET | Freq: Two times a day (BID) | ORAL | Status: DC

## 2016-03-17 NOTE — Progress Notes (Signed)
Problem List  1. CAD - CABG  2. Essential Hypertension 3. Hyperlipidemia   Ryan [name] is a 75 y.o. man  With a history of hyperlipidemia, coronary artery disease, hypothyroidism  He was admitted Mrch 31 with CP and dyspnea.   VQ was intermediate.   Was clinically thought to have a PE  So he was DC'd on NOAC. Was readmitted several days later with STEMI and cath revealed severe CAD. CT angio on April 16 showed no evidence of pulmonary embolus.   Oct. 4, 2016:  Ryan [name] is doing well.  Occasional episodes of orthostatic hypotension Is eating some salty snacks. Has lots of hip pain with walking  Encouraged him to try a stationary bike or elliptical  Had peripheral arterial duplex scan ( ? At [facility]) that was reportedly normal.   March 17, 2016  Thought he had some symptoms of food poisoning - occurred after eating .  Also may have developed back issues related to taking the crestor also  He stopped the crestor and the symptoms have resolved.     Objective: Vital signs in last 24 hours: Temp:  [97.5 F (36.4 C)-97.9 F (36.6 C)] 97.9 F (36.6 C) (04/14 0435) Pulse Rate:  [67-81] 74 (04/14 0435) Resp:  [18] 18 (04/14 0435) BP: (105-144)/(52-77) 123/68 mmHg (04/14 0435) SpO2:  [95 %-97 %] 96 % (04/14 0435)    Medications Current Outpatient Prescriptions  Medication Sig Dispense Refill  . amLODipine (NORVASC) 5 MG tablet Take 5 mg by mouth daily.    Marland Kitchen aspirin 81 MG chewable tablet Chew 1 tablet (81 mg total) by mouth daily.    . diclofenac (VOLTAREN) 75 MG EC tablet Take 75 mg by mouth 2 (two) times daily.    Marland Kitchen docusate sodium (COLACE) 100 MG capsule Take 100 mg by mouth daily.    . silodosin (RAPAFLO) 8 MG CAPS capsule Take 8 mg by mouth at bedtime.     Marland Kitchen SYNTHROID 50 MCG tablet Take 50 mcg by mouth daily before breakfast.     . valsartan (DIOVAN) 160 MG tablet Take 160 mg by mouth daily.    Marland Kitchen ZETIA 10 MG tablet Take 10 mg by mouth daily.     . carvedilol (COREG)  3.125 MG tablet Take 1 tablet (3.125 mg total) by mouth 2 (two) times daily. 60 tablet 11  . levothyroxine (SYNTHROID, LEVOTHROID) 25 MCG tablet Take 1 tablet (25 mcg total) by mouth daily before breakfast. (Patient not taking: Reported on 03/17/2016) 30 tablet 11   No current facility-administered medications for this visit.    PE: BP 150/80 mmHg  Pulse 88  Ht 5\' 6"  (1.676 m)  Wt 179 lb (81.194 kg)  BMI 28.91 kg/m2 HEENT:   No JVD, soft left carotid bruit Lungs: clear  Cor:  RR Abd:  + BS ,  Ext , no clubbing cyanosis , no edema,  Skin , normal Psych:  Normal ,   Neuro: CN intact  ECG : Ordered by by :   NSR at 80 with frequent PACS.     Lab Results:  No results for input(s): WBC, HGB, HCT, PLT in the last 72 hours. BMET No results for input(s): NA, K, CL, CO2, GLUCOSE, BUN, CREATININE, CALCIUM in the last 72 hours.  Assessment/Plan    1. CAD: severe multivessel disease. CABG    he's doing well. He's not had any further episodes of chest discomfort. He had his labs checked several weeks ago with [name]. Will see  him again in 6 months and recheck fasting labs  He's having some atypical episodes of chest pain but they seem to be associated with his diffuse muscle aches and back pain. I'm attributing these vague aches and pains to the Crestor therapy. He will call us in several weeks to see if these various aches and pains have resolved after stopping the Crestor.   2. Hyperlipidemia - We'll try him on Crestor but he has significant muscle aches. He tried Zetia in the past but stopped it due to cost. At this point I think that we should retry the Zetia.  3. Hypothyroidism  4.  Essential hypertension-  Blood pressure is a little elevated. I've encouraged him to decrease his salt intake. He will record his BP on occasion      Ramond Dial., MD, [facility] 03/17/2016, 10:07 AM [address],  [place] Pager [phone]

## 2016-03-17 NOTE — Patient Instructions (Addendum)
Medication Instructions:  STOP Crestor Continue Zetia START Coreg (Carvedilol) 3.125 mg twice daily - take 12 hours apart   Labwork: Your physician recommends that you return for lab work in: 3 months on the day of or a few days before your office visit with Dr. Acie Fredrickson.  You will need to FAST for this appointment - nothing to eat or drink after midnight the night before except water.    Testing/Procedures: None Ordered   Follow-Up: Your physician has requested that you regularly monitor and record your blood pressure readings at home. Please use the same machine at the same time of day to check your readings and record them and call Sharyn Lull in 2 weeks to report   Your physician recommends that you schedule a follow-up appointment in: 3 months with Dr. Acie Fredrickson   If you need a refill on your cardiac medications before your next appointment, please call your pharmacy.   Thank you for choosing [facility]! Christen Bame, RN [phone]

## 2016-03-18 ENCOUNTER — Encounter: Payer: Self-pay | Admitting: Cardiovascular Disease

## 2016-03-24 ENCOUNTER — Telehealth: Payer: Self-pay | Admitting: Cardiovascular Disease

## 2016-03-24 NOTE — Telephone Encounter (Signed)
New message   Pt c/o medication issue: need a medication change  1. Name of Medication: AZETIA  2. How are you currently taking this medication (dosage and times per day)? 1x day  3. Are you having a reaction (difficulty breathing--STAT)? no  4. What is your medication issue? Medication has caused his joints to hurt and unable to walk  Stopped taking the medications since 03-17-16

## 2016-03-24 NOTE — Telephone Encounter (Signed)
Spoke with patient who states he has had difficulty and pain with walking and moving which he thought was related to Crestor but has felt better since stopping Zetia on 3/28.  He states the episodes he was having when he saw Dr. Acie Fredrickson last week and complained of pain and nausea have resolved also since stopping zetia.   He has not been able to tolerate atorvastatin, pravastatin, rosuvastatin, or zetia.  He asked if there are any other alternatives and I briefly discussed injectable medications and that he would need a lipid clinic appointment in order to start one of those.  He is scheduled in lipid clinic on 4/18.  He thanked me for the call.

## 2016-03-24 NOTE — Telephone Encounter (Signed)
Agree with referral to lipid clinic

## 2016-04-07 ENCOUNTER — Ambulatory Visit (INDEPENDENT_AMBULATORY_CARE_PROVIDER_SITE_OTHER): Payer: Medicare Other | Admitting: Pharmacist

## 2016-04-07 ENCOUNTER — Encounter: Payer: Self-pay | Admitting: Pharmacist

## 2016-04-07 VITALS — BP 154/78 | HR 69

## 2016-04-07 DIAGNOSIS — I2510 Atherosclerotic heart disease of native coronary artery without angina pectoris: Secondary | ICD-10-CM | POA: Diagnosis not present

## 2016-04-07 DIAGNOSIS — I70211 Atherosclerosis of native arteries of extremities with intermittent claudication, right leg: Secondary | ICD-10-CM | POA: Diagnosis not present

## 2016-04-07 DIAGNOSIS — I2583 Coronary atherosclerosis due to lipid rich plaque: Principal | ICD-10-CM

## 2016-04-07 MED ORDER — ROSUVASTATIN CALCIUM 5 MG PO TABS: 5.0000 mg | ORAL_TABLET | ORAL | Status: DC

## 2016-04-07 NOTE — Patient Instructions (Addendum)
Thank you for coming in today.   Please start taking Rosuvastatin (Crestor) 5 mg three times a week (can take Monday, Wednesday, and Friday)  If you do have muscle pain you can try taking twice weekly or even once weekly to see if this helps with the muscle pain.   Please call the clinic if you have any problems with the Crestor at [phone]  Please return to the clinic lab on 06/08/16 at 9 am for a fasting lipid panel.

## 2016-04-07 NOTE — Progress Notes (Signed)
Patient [id number] Ryan [name]   DOB: 1941/11/17   [id number]     HPI: Ryan [name] is a 75 y.o. male patient referred to lipid clinic by Dr. Acie Fredrickson.  He has a PMH of CAD, HTN, carotid stenosis, PVD, PAD, and hypothyroidism.   Patient reports that he [name] able to tolerate Crestor for about a year and Zetia for around 6 months. He [name] on both Crestor 10 mg daily and Zetia 10 mg daily for around 6 months with some diffuse muscle aches.  His Crestor [name] increased from 10 mg daily to 20 mg daily in January 2017.  In March 2017, Dr. Acie Fredrickson stopped the Crestor due to diffuse muscle aches and he [name] continued on Zetia. However in March he did have one episode of muscle pain in which he [name] unable to walk on Zetia monotherapy.  He states the pain stopped after 3-4 days and he no longer has muscle pain today in clinic.  Patient reports significant weight loss since October or November of last year.  Of note, he has Medicare as his primary insurance.   Today he also reports an episode of shortness of breath, sweating, nausea after taking his amlodipine.  This has since resolved and he has started taking amlodipine 2.5 mg daily instead of 5 mg daily which has helped.   Current Medications: No current lipid lowering medications  Intolerances: ezetimibe 10 mg daily (muscle pain, unable to walk, pain stopped after 3-4 days), rosuvastatin 10 mg, 20 mg, 40 mg daily  (muscle aches), pravastatin 20 mg daily (constipation), atorvastatin 40 mg daily (unknown reaction), welchol 1250 mg BID (unknown reason for stopping) Risk Factors: CAD, PVD, PAD LDL goal: <70 mg/dL  Diet:  Breakfast - toast or tenderloin biscuit (every 2-3 weeks) Lunch - pack of crackers Dinner - steak or chicken with potatoes or salads Snacks - occasional sweets/candy  Drinks - wter, occasional soda  Exercise: Did buy a stationary bike but stopped due muscle pain  Family History: family history includes Diabetes in his son; Heart attack  in his father and sister; Heart disease in his father and sister; Hypertension in his mother and sister.  Social History:  reports that he quit smoking about 29 years ago. His smoking use included Cigarettes. He has quit using smokeless tobacco. His smokeless tobacco use included Chew. He reports that he does not drink alcohol or use illicit drugs.  Labs: 12/26/15: TC 145, TG 77, HDL 39, LDL 91, LFTs wnl (on rosuvastatin 10 mg daily and ezetimibe 10 mg daily)  Past Medical History  Diagnosis Date  . Arthritis   . Hyperlipidemia     takes Crestor daily  . Peripheral vascular disease ([place])   . Carotid artery occlusion   . Hypertension     takes Amlodipine daily  . Chronic back pain   . History of gout     has colchicine prn  . History of colon polyps   . Urinary frequency   . Urinary urgency   . Enlarged prostate     takes Rapaflo daily  . History of kidney stones   . Hyperthyroidism     takes SYnthroid daily as instructed  . Cataract     Bil eyes/worse in left eye  . GERD (gastroesophageal reflux disease)     occasional  . Pulmonary emboli ([place]) 03/20/2015    elevated d-dimer, intermediate V/Q study, atypical chest pain and SOB. Start on Xarelto 20mg  BID for 3 month  . Renal insufficiency   .  Myocardial infarction ([place])   . DVT (deep venous thrombosis) ([place])   . CAD (coronary artery disease)   . CHF (congestive heart failure) ([place])     Current Outpatient Prescriptions on File Prior to Visit  Medication Sig Dispense Refill  . amLODipine (NORVASC) 5 MG tablet Take 5 mg by mouth daily.    Marland Kitchen aspirin 81 MG chewable tablet Chew 1 tablet (81 mg total) by mouth daily.    . carvedilol (COREG) 3.125 MG tablet Take 1 tablet (3.125 mg total) by mouth 2 (two) times daily. 60 tablet 11  . diclofenac (VOLTAREN) 75 MG EC tablet Take 75 mg by mouth 2 (two) times daily.    Marland Kitchen docusate sodium (COLACE) 100 MG capsule Take 100 mg by mouth daily.    Marland Kitchen levothyroxine (SYNTHROID, LEVOTHROID) 25 MCG  tablet Take 1 tablet (25 mcg total) by mouth daily before breakfast. (Patient not taking: Reported on 03/17/2016) 30 tablet 11  . silodosin (RAPAFLO) 8 MG CAPS capsule Take 8 mg by mouth at bedtime.     Marland Kitchen SYNTHROID 50 MCG tablet Take 50 mcg by mouth daily before breakfast.     . valsartan (DIOVAN) 160 MG tablet Take 160 mg by mouth daily.     No current facility-administered medications on file prior to visit.    Allergies  Allergen Reactions  . Codeine Nausea And Vomiting         Assessment/Plan:  1. Hyperlipidemia: LDL currently 91mg /dL above goal 70mg /dL given history of CAD, PVD, and PAD. Not currently taking any lipid lowering medications. He has history of intolerance to rosuvastatin 10 mg, 20 mg, and 40 mg daily, ezetimibe 10 mg daily, atorvastatin 40 mg daily, pravastatin 20 mg daily, and colesevelam 625 mg BID.  Discussed efficacy/benefits, MOA, cost and side effects of statins vs PCSK9 inhibitors vs ezetimibe. Unfortunately, cost will be preventative for PCSK9i therapy. Will trial rosuvastatin 5 mg 3 times per week.  Instructed pt to call clinic if he experiences severe muscle pain. Instructed him to trial the rosuvastatin once or twice weekly if he does experience mild muscle pain.  Counseled on diet and exercise.  Encouraged patient to start riding his stationary bike in a couple of weeks after he sees if the rosuvastatin causes any myalgias or not. Will plan follow-up fasting lipid panel and LFTs in 2 months.

## 2016-04-08 ENCOUNTER — Encounter: Payer: Self-pay | Admitting: Vascular Surgery

## 2016-04-14 ENCOUNTER — Ambulatory Visit (HOSPITAL_COMMUNITY)
Admission: RE | Admit: 2016-04-14 | Discharge: 2016-04-14 | Disposition: A | Discharge status: Home / Self Care | Payer: Medicare Other | Source: Ambulatory Visit | Attending: Vascular Surgery | Admitting: Vascular Surgery

## 2016-04-14 ENCOUNTER — Other Ambulatory Visit: Payer: Self-pay

## 2016-04-14 ENCOUNTER — Telehealth: Payer: Self-pay | Admitting: Cardiovascular Disease

## 2016-04-14 ENCOUNTER — Encounter: Payer: Self-pay | Admitting: Vascular Surgery

## 2016-04-14 ENCOUNTER — Ambulatory Visit (INDEPENDENT_AMBULATORY_CARE_PROVIDER_SITE_OTHER): Payer: Medicare Other | Admitting: Vascular Surgery

## 2016-04-14 VITALS — BP 140/76 | HR 70 | Temp 98.0°F | Resp 16 | Ht 67.0 in | Wt 179.0 lb

## 2016-04-14 DIAGNOSIS — I110 Hypertensive heart disease with heart failure: Secondary | ICD-10-CM | POA: Insufficient documentation

## 2016-04-14 DIAGNOSIS — K219 Gastro-esophageal reflux disease without esophagitis: Secondary | ICD-10-CM | POA: Insufficient documentation

## 2016-04-14 DIAGNOSIS — I509 Heart failure, unspecified: Secondary | ICD-10-CM | POA: Diagnosis not present

## 2016-04-14 DIAGNOSIS — I6522 Occlusion and stenosis of left carotid artery: Secondary | ICD-10-CM | POA: Diagnosis not present

## 2016-04-14 DIAGNOSIS — I739 Peripheral vascular disease, unspecified: Secondary | ICD-10-CM | POA: Diagnosis not present

## 2016-04-14 DIAGNOSIS — I70211 Atherosclerosis of native arteries of extremities with intermittent claudication, right leg: Secondary | ICD-10-CM | POA: Diagnosis not present

## 2016-04-14 DIAGNOSIS — E785 Hyperlipidemia, unspecified: Secondary | ICD-10-CM | POA: Insufficient documentation

## 2016-04-14 DIAGNOSIS — I6523 Occlusion and stenosis of bilateral carotid arteries: Secondary | ICD-10-CM | POA: Diagnosis not present

## 2016-04-14 NOTE — Progress Notes (Signed)
Subjective:     Patient ID: Ryan [name], male   DOB: August 11, 1941, 75 y.o.   MRN: [id number]  HPI This 75 year old male seen today in continued follow-up regarding his left carotid occlusive disease. He was last seen 9 months ago and had a 70-75% asymptomatic stenosis. She denies any history of stroke, TIAs, or other neurologic symptoms. He did have coronary artery bypass grafting performed 04/05/2015 by Dr. Elita Quick has done quite well. He does have some bilateral hip claudication symptoms. He is not on anticoagulants.  Past Medical History  Diagnosis Date  . Arthritis   . Hyperlipidemia     takes Crestor daily  . Peripheral vascular disease ([place])   . Carotid artery occlusion   . Hypertension     takes Amlodipine daily  . Chronic back pain   . History of gout     has colchicine prn  . History of colon polyps   . Urinary frequency   . Urinary urgency   . Enlarged prostate     takes Rapaflo daily  . History of kidney stones   . Hyperthyroidism     takes SYnthroid daily as instructed  . Cataract     Bil eyes/worse in left eye  . GERD (gastroesophageal reflux disease)     occasional  . Pulmonary emboli ([place]) 03/20/2015    elevated d-dimer, intermediate V/Q study, atypical chest pain and SOB. Start on Xarelto 20mg  BID for 3 month  . Renal insufficiency   . Myocardial infarction ([place])   . DVT (deep venous thrombosis) ([place])   . CAD (coronary artery disease)   . CHF (congestive heart failure) ([place])     Social History  Substance Use Topics  . Smoking status: Former Smoker    Types: Cigarettes    Quit date: 02/04/1987  . Smokeless tobacco: Former Systems [employer]    Types: Chew     Comment: quit 35+yrs ago  . Alcohol Use: No    Family History  Problem Relation Age of Onset  . Heart disease Father   . Heart attack Father   . Heart disease Sister   . Hypertension Sister   . Heart attack Sister   . Hypertension Mother   . Diabetes Son     Allergies  Allergen Reactions  .  Codeine Nausea And Vomiting       . Zetia [Ezetimibe] Other (See Comments)    Myalgias      Current outpatient prescriptions:  .  amLODipine (NORVASC) 5 MG tablet, Take 5 mg by mouth daily., Disp: , Rfl:  .  aspirin 81 MG chewable tablet, Chew 1 tablet (81 mg total) by mouth daily., Disp: , Rfl:  .  docusate sodium (COLACE) 100 MG capsule, Take 100 mg by mouth daily., Disp: , Rfl:  .  rosuvastatin (CRESTOR) 5 MG tablet, Take 1 tablet (5 mg total) by mouth 3 (three) times a week., Disp: 15 tablet, Rfl: 3 .  silodosin (RAPAFLO) 8 MG CAPS capsule, Take 8 mg by mouth at bedtime. , Disp: , Rfl:  .  SYNTHROID 50 MCG tablet, Take 50 mcg by mouth daily before breakfast. , Disp: , Rfl:   Filed Vitals:   04/14/16 1223  BP: 140/76  Pulse: 70  Temp: 98 F (36.7 C)  Resp: 16  Height: 5\' 7"  (1.702 m)  Weight: 179 lb (81.194 kg)  SpO2: 100%    Body mass index is 28.03 kg/(m^2).           Review of Systems  Denies chest pain, dyspnea on exertion, PND, orthopnea , hemoptysis. Does take anti-lipid medication. Was thought to have pulmonary embolus in year ago when his coronary artery disease was diagnosed in it was determined that he did have a non-STEMI and then underwent coronary artery bypass grafting. It appeard  that he had a pulmonary embolus  But that turned out not to be the case. He had an uneventful postoperative course following his CABG. He recently saw Dr. Liam Rogers  -his cardiologist who I spoke with by phone today and states that he is doing quite well and is low risk for surgery.   Past Medical History  Diagnosis Date  . Arthritis   . Hyperlipidemia     takes Crestor daily  . Peripheral vascular disease ([place])   . Carotid artery occlusion   . Hypertension     takes Amlodipine daily  . Chronic back pain   . History of gout     has colchicine prn  . History of colon polyps   . Urinary frequency   . Urinary urgency   . Enlarged prostate     takes Rapaflo daily  .  History of kidney stones   . Hyperthyroidism     takes SYnthroid daily as instructed  . Cataract     Bil eyes/worse in left eye  . GERD (gastroesophageal reflux disease)     occasional  . Pulmonary emboli ([place]) 03/20/2015    elevated d-dimer, intermediate V/Q study, atypical chest pain and SOB. Start on Xarelto 20mg  BID for 3 month  . Renal insufficiency   . Myocardial infarction ([place])   . DVT (deep venous thrombosis) ([place])   . CAD (coronary artery disease)   . CHF (congestive heart failure) ([place])     Social History  Substance Use Topics  . Smoking status: Former Smoker    Types: Cigarettes    Quit date: 02/04/1987  . Smokeless tobacco: Former Systems developer    Types: Chew     Comment: quit 35+yrs ago  . Alcohol Use: No    Family History  Problem Relation Age of Onset  . Heart disease Father   . Heart attack Father   . Heart disease Sister   . Hypertension Sister   . Heart attack Sister   . Hypertension Mother   . Diabetes Son     Allergies  Allergen Reactions  . Codeine Nausea And Vomiting       . Zetia [Ezetimibe] Other (See Comments)    Myalgias      Current outpatient prescriptions:  .  amLODipine (NORVASC) 5 MG tablet, Take 5 mg by mouth daily., Disp: , Rfl:  .  aspirin 81 MG chewable tablet, Chew 1 tablet (81 mg total) by mouth daily., Disp: , Rfl:  .  docusate sodium (COLACE) 100 MG capsule, Take 100 mg by mouth daily., Disp: , Rfl:  .  rosuvastatin (CRESTOR) 5 MG tablet, Take 1 tablet (5 mg total) by mouth 3 (three) times a week., Disp: 15 tablet, Rfl: 3 .  silodosin (RAPAFLO) 8 MG CAPS capsule, Take 8 mg by mouth at bedtime. , Disp: , Rfl:  .  SYNTHROID 50 MCG tablet, Take 50 mcg by mouth daily before breakfast. , Disp: , Rfl:   Filed Vitals:   04/14/16 1223  BP: 140/76  Pulse: 70  Temp: 98 F (36.7 C)  Resp: 16  Height: 5\' 7"  (1.702 m)  Weight: 179 lb (81.194 kg)  SpO2: 100%    Body mass index  is 28.03 kg/(m^2).       Denies chest pain, dyspnea  on exertion, PND, orthopnea, hemoptysis, lateralizing weakness, claudication. Other systems negative and complete review of systems       Objective:   Physical Exam BP 140/76 mmHg  Pulse 70  Temp(Src) 98 F (36.7 C)  Resp 16  Ht 5\' 7"  (1.702 m)  Wt 179 lb (81.194 kg)  BMI 28.03 kg/m2  SpO2 100%    Gen.-alert and oriented x3 in no apparent distress HEENT normal for age Lungs no rhonchi or wheezing Cardiovascular regular rhythm no murmurs carotid pulses 3+ palpable no bruits audible Abdomen soft nontender no palpable masses Musculoskeletal free of  major deformities Skin clear -no rashes Neurologic normal Lower extremities 3+ femoral and dorsalis pedis pulses palpable bilaterally with no edema   today I ordered a carotid duplex exam which I reviewed and interpreted and compared to the most recent study 9 months ago. There has been a significant progression of his left internal carotid stenosis to now approximating 90-95% with minimal right ICA stenosis.     Assessment:      90-95% left ICA stenosis-asymptomatic    coronary artery disease  -status post coronary artery bypass grafting April 2016 doing well   GERD Hypertension   hyper thyroidism -takes Synthroid    Plan:      discussed options with patient and have recommended left carotid endarterectomy I spoke with Dr. Liam Rogers  Who feels the patient is low risk for surgery and does not need further cardiac clearance  Patient would like to proceed with left carotid endarterectomy Have scheduled that for Friday, May 5 Wrist and benefits thoroughly discussed with patient including risk of stroke and he  Would like to proceed.

## 2016-04-14 NOTE — Telephone Encounter (Signed)
Phone discussion with Dr. Victorino Dike. Pt has worsening carotid artery disease. Had CABG 1 year ago. Has been very stable.   No angina I think that he is at low risk for his upcoming CEA. I do not think that we need a myoview study this soon after his CABG    Poppy Mcafee, [name], MD  04/14/2016 1:09 PM    [address],  [place], [place]  [address] Pager [phone] Phone: [phone]; Fax: [phone]

## 2016-04-20 ENCOUNTER — Telehealth: Payer: Self-pay | Admitting: Cardiovascular Disease

## 2016-04-20 DIAGNOSIS — Z6829 Body mass index (BMI) 29.0-29.9, adult: Secondary | ICD-10-CM | POA: Diagnosis not present

## 2016-04-20 DIAGNOSIS — R1011 Right upper quadrant pain: Secondary | ICD-10-CM | POA: Diagnosis not present

## 2016-04-20 DIAGNOSIS — I6529 Occlusion and stenosis of unspecified carotid artery: Secondary | ICD-10-CM | POA: Diagnosis not present

## 2016-04-20 DIAGNOSIS — R7301 Impaired fasting glucose: Secondary | ICD-10-CM | POA: Diagnosis not present

## 2016-04-20 NOTE — Telephone Encounter (Signed)
New Message:  Pt is calling in stating that he has been having some problems with really bad pains in his right side and some SOB. He would like to know what is going on before he has surgery. Please f/u with him

## 2016-04-20 NOTE — Telephone Encounter (Signed)
I agree.  This sounds more like a gall bladder issue.  He should see his primary before having his CEA later this week

## 2016-04-20 NOTE — Telephone Encounter (Signed)
Spoke with patient who states he has had 2 episodes recently of feeling nauseated, gets hot, has pressure in chest, and pain in right side that radiates up into his right shoulder.  He states first episode occurred 2 weeks ago and second episode occurred last night.  States he ate at [employer] yesterday afternoon and he starting hurting some time after that.  States he hurt last night and when he woke up he felt nauseated and like he wanted to vomit but he did not; denies diarrhea.  States right ribs and shoulder blade hurting now and he is still nauseated; has only had coffee this morning.  He is scheduled for CEA Friday and he states Dr. Kellie Simmering advised him that he may experience some dizziness prior to surgery.  Denies dizziness without nausea.  I talked with him a few weeks ago about leg aches and he states they have resolved.  He still has his gallbladder.  I advised him to call his PCP for evaluation today.  I advised I will send message to Dr. Acie Fredrickson who is working in the hospital today for additional advice and will call him back later.  He verbalized understanding and agreement.

## 2016-04-20 NOTE — Telephone Encounter (Signed)
Spoke with patient and reviewed Dr. Elmarie Shiley advice with him. He states he has an appointment today with [name], PCP.  I advised him to call back with questions or concerns.

## 2016-04-21 ENCOUNTER — Encounter (HOSPITAL_COMMUNITY): Payer: Self-pay

## 2016-04-21 ENCOUNTER — Encounter (HOSPITAL_COMMUNITY)
Admission: RE | Admit: 2016-04-21 | Discharge: 2016-04-21 | Disposition: A | Discharge status: Home / Self Care | Payer: Medicare Other | Source: Ambulatory Visit | Attending: Vascular Surgery | Admitting: Vascular Surgery

## 2016-04-21 DIAGNOSIS — N281 Cyst of kidney, acquired: Secondary | ICD-10-CM | POA: Diagnosis not present

## 2016-04-21 DIAGNOSIS — K828 Other specified diseases of gallbladder: Secondary | ICD-10-CM | POA: Diagnosis not present

## 2016-04-21 HISTORY — DX: Cardiac arrhythmia, unspecified: I49.9

## 2016-04-21 HISTORY — DX: Reserved for inherently not codable concepts without codable children: IMO0001

## 2016-04-21 LAB — SURGICAL PCR SCREEN
MRSA, PCR: NEGATIVE
STAPHYLOCOCCUS AUREUS: NEGATIVE

## 2016-04-21 LAB — URINALYSIS, ROUTINE W REFLEX MICROSCOPIC
BILIRUBIN URINE: NEGATIVE
GLUCOSE, UA: NEGATIVE mg/dL
Hgb urine dipstick: NEGATIVE
Ketones, ur: NEGATIVE mg/dL
Leukocytes, UA: NEGATIVE
Nitrite: NEGATIVE
PH: 5 (ref 5.0–8.0)
Protein, ur: NEGATIVE mg/dL
SPECIFIC GRAVITY, URINE: 1.023 (ref 1.005–1.030)

## 2016-04-21 LAB — PROTIME-INR
INR: 1.09 (ref 0.00–1.49)
PROTHROMBIN TIME: 14.3 s (ref 11.6–15.2)

## 2016-04-21 LAB — APTT: APTT: 26 s (ref 24–37)

## 2016-04-21 LAB — TYPE AND SCREEN
ABO/RH(D): A POS
ANTIBODY SCREEN: NEGATIVE

## 2016-04-21 NOTE — Pre-Procedure Instructions (Signed)
Sol Breyette Honeycutt  04/21/2016      [facility] [place], [place] - [address] [place] [address] Phone: [phone] Fax: [phone]    Your procedure is scheduled on May 5  Report to [facility] at 530 A.M.  Call this number if you have problems the morning of surgery:  [phone]   Remember:  Do not eat food or drink liquids after midnight.  Take these medicines the morning of surgery with A SIP OF WATER Amlodipine (norvasc)   Stop taking aspirin, BC's, Goody's, Herbal medications, Fish Oil, Ibuprofen, Advil, Motrin, Aleve   Do not wear jewelry, make-up or nail polish.  Do not wear lotions, powders, or perfumes.  You may wear deodorant.  Do not shave 48 hours prior to surgery.  Men may shave face and neck.  Do not bring valuables to the hospital.  [facility] is not responsible for any belongings or valuables.  Contacts, dentures or bridgework may not be worn into surgery.  Leave your suitcase in the car.  After surgery it may be brought to your room.  For patients admitted to the hospital, discharge time will be determined by your treatment team.  Patients discharged the day of surgery will not be allowed to drive home. Special instructions:   - Preparing for Surgery  Before surgery, you can play an important role.  Because skin is not sterile, your skin needs to be as free of germs as possible.  You can reduce the number of germs on you skin by washing with CHG (chlorahexidine gluconate) soap before surgery.  CHG is an antiseptic cleaner which kills germs and bonds with the skin to continue killing germs even after washing.  Please DO NOT use if you have an allergy to CHG or antibacterial soaps.  If your skin becomes reddened/irritated stop using the CHG and inform your nurse when you arrive at Short Stay.  Do not shave (including legs and underarms) for at least 48 hours prior  to the first CHG shower.  You may shave your face.  Please follow these instructions carefully:   1.  Shower with CHG Soap the night before surgery and the                                morning of Surgery.  2.  If you choose to wash your hair, wash your hair first as usual with your       normal shampoo.  3.  After you shampoo, rinse your hair and body thoroughly to remove the                      Shampoo.  4.  Use CHG as you would any other liquid soap.  You can apply chg directly       to the skin and wash gently with scrungie or a clean washcloth.  5.  Apply the CHG Soap to your body ONLY FROM THE NECK DOWN.        Do not use on open wounds or open sores.  Avoid contact with your eyes,       ears, mouth and genitals (private parts).  Wash genitals (private parts)       with your normal soap.  6.  Wash thoroughly, paying special attention to the area where your surgery  will be performed.  7.  Thoroughly rinse your body with warm water from the neck down.  8.  DO NOT shower/wash with your normal soap after using and rinsing off       the CHG Soap.  9.  Pat yourself dry with a clean towel.            10.  Wear clean pajamas.            11.  Place clean sheets on your bed the night of your first shower and do not        sleep with pets.  Day of Surgery  Do not apply any lotions/deoderants the morning of surgery.  Please wear clean clothes to the hospital/surgery center.     Please read over the following fact sheets that you were given. Pain Booklet, Coughing and Deep Breathing, Blood Transfusion Information, MRSA Information and Surgical Site Infection Prevention

## 2016-04-21 NOTE — Progress Notes (Signed)
PCP is Dr Marton Redwood Cardiologist is Dr Acie Fredrickson- whom he saw 03-17-16- noted that he is low risk- See note from 04-14-16 Echo noted 03-20-15 Stress test noted 03-06-15 card cath noted 2016 He had some labs done yesterday at Dr Raul Del office placed report in his chart- (CBC ans CMP)

## 2016-04-24 ENCOUNTER — Inpatient Hospital Stay (HOSPITAL_COMMUNITY): Payer: Medicare Other | Admitting: Anesthesiology

## 2016-04-24 ENCOUNTER — Encounter (HOSPITAL_COMMUNITY)
Admission: RE | Disposition: A | Discharge status: Home / Self Care | Payer: Self-pay | Source: Ambulatory Visit | Attending: Vascular Surgery

## 2016-04-24 ENCOUNTER — Encounter (HOSPITAL_COMMUNITY): Payer: Self-pay | Admitting: *Deleted

## 2016-04-24 ENCOUNTER — Telehealth: Payer: Self-pay | Admitting: Vascular Surgery

## 2016-04-24 ENCOUNTER — Inpatient Hospital Stay (HOSPITAL_COMMUNITY)
Admission: RE | Admit: 2016-04-24 | Discharge: 2016-04-25 | DRG: 039 | Disposition: A | Discharge status: Home / Self Care | Payer: Medicare Other | Source: Ambulatory Visit | Attending: Vascular Surgery | Admitting: Vascular Surgery

## 2016-04-24 DIAGNOSIS — M1990 Unspecified osteoarthritis, unspecified site: Secondary | ICD-10-CM | POA: Diagnosis not present

## 2016-04-24 DIAGNOSIS — Z87891 Personal history of nicotine dependence: Secondary | ICD-10-CM

## 2016-04-24 DIAGNOSIS — I779 Disorder of arteries and arterioles, unspecified: Secondary | ICD-10-CM | POA: Diagnosis present

## 2016-04-24 DIAGNOSIS — I2510 Atherosclerotic heart disease of native coronary artery without angina pectoris: Secondary | ICD-10-CM | POA: Diagnosis present

## 2016-04-24 DIAGNOSIS — E785 Hyperlipidemia, unspecified: Secondary | ICD-10-CM | POA: Diagnosis present

## 2016-04-24 DIAGNOSIS — I252 Old myocardial infarction: Secondary | ICD-10-CM

## 2016-04-24 DIAGNOSIS — I10 Essential (primary) hypertension: Secondary | ICD-10-CM | POA: Diagnosis present

## 2016-04-24 DIAGNOSIS — N400 Enlarged prostate without lower urinary tract symptoms: Secondary | ICD-10-CM | POA: Diagnosis present

## 2016-04-24 DIAGNOSIS — Z951 Presence of aortocoronary bypass graft: Secondary | ICD-10-CM

## 2016-04-24 DIAGNOSIS — I739 Peripheral vascular disease, unspecified: Secondary | ICD-10-CM | POA: Diagnosis present

## 2016-04-24 DIAGNOSIS — Z01812 Encounter for preprocedural laboratory examination: Secondary | ICD-10-CM | POA: Diagnosis not present

## 2016-04-24 DIAGNOSIS — Z7982 Long term (current) use of aspirin: Secondary | ICD-10-CM | POA: Diagnosis not present

## 2016-04-24 DIAGNOSIS — Z885 Allergy status to narcotic agent status: Secondary | ICD-10-CM | POA: Diagnosis not present

## 2016-04-24 DIAGNOSIS — Z0183 Encounter for blood typing: Secondary | ICD-10-CM | POA: Diagnosis not present

## 2016-04-24 DIAGNOSIS — Z79899 Other long term (current) drug therapy: Secondary | ICD-10-CM | POA: Diagnosis not present

## 2016-04-24 DIAGNOSIS — E0590 Thyrotoxicosis, unspecified without thyrotoxic crisis or storm: Secondary | ICD-10-CM | POA: Diagnosis present

## 2016-04-24 DIAGNOSIS — I6522 Occlusion and stenosis of left carotid artery: Secondary | ICD-10-CM | POA: Diagnosis not present

## 2016-04-24 DIAGNOSIS — Z8249 Family history of ischemic heart disease and other diseases of the circulatory system: Secondary | ICD-10-CM

## 2016-04-24 DIAGNOSIS — Z888 Allergy status to other drugs, medicaments and biological substances status: Secondary | ICD-10-CM | POA: Diagnosis not present

## 2016-04-24 DIAGNOSIS — K219 Gastro-esophageal reflux disease without esophagitis: Secondary | ICD-10-CM | POA: Diagnosis present

## 2016-04-24 HISTORY — PX: PATCH ANGIOPLASTY: SHX6230

## 2016-04-24 HISTORY — PX: ENDARTERECTOMY: SHX5162

## 2016-04-24 LAB — CBC
HEMATOCRIT: 41.8 % (ref 39.0–52.0)
HEMOGLOBIN: 13.9 g/dL (ref 13.0–17.0)
MCH: 31 pg (ref 26.0–34.0)
MCHC: 33.3 g/dL (ref 30.0–36.0)
MCV: 93.1 fL (ref 78.0–100.0)
PLATELETS: 223 10*3/uL (ref 150–400)
RBC: 4.49 MIL/uL (ref 4.22–5.81)
RDW: 12.9 % (ref 11.5–15.5)
WBC: 17.2 10*3/uL — ABNORMAL HIGH (ref 4.0–10.5)

## 2016-04-24 LAB — CREATININE, SERUM: CREATININE: 0.93 mg/dL (ref 0.61–1.24)

## 2016-04-24 SURGERY — ENDARTERECTOMY, CAROTID
Anesthesia: General | Site: Neck | Laterality: Left

## 2016-04-24 MED ORDER — ASPIRIN 81 MG PO CHEW
81.0000 mg | CHEWABLE_TABLET | Freq: Every day | ORAL | Status: DC
  Administered 2016-04-24 – 2016-04-25 (×2): 81 mg via ORAL
  Filled 2016-04-24 (×2): qty 1

## 2016-04-24 MED ORDER — POTASSIUM CHLORIDE CRYS ER 20 MEQ PO TBCR: 20.0000 meq | EXTENDED_RELEASE_TABLET | Freq: Every day | ORAL | Status: DC | PRN

## 2016-04-24 MED ORDER — OXYCODONE-ACETAMINOPHEN 5-325 MG PO TABS
1.0000 | ORAL_TABLET | ORAL | Status: DC | PRN
  Administered 2016-04-24: 1 via ORAL
  Filled 2016-04-24: qty 1

## 2016-04-24 MED ORDER — HEPARIN SODIUM (PORCINE) 5000 UNIT/ML IJ SOLN
INTRAMUSCULAR | Status: DC | PRN
  Administered 2016-04-24: 500 mL

## 2016-04-24 MED ORDER — HYDROMORPHONE HCL 1 MG/ML IJ SOLN
INTRAMUSCULAR | Status: AC
  Filled 2016-04-24: qty 1

## 2016-04-24 MED ORDER — SODIUM CHLORIDE 0.9 % IJ SOLN
INTRAMUSCULAR | Status: AC
  Filled 2016-04-24: qty 10

## 2016-04-24 MED ORDER — ONDANSETRON HCL 4 MG/2ML IJ SOLN
INTRAMUSCULAR | Status: AC
  Filled 2016-04-24: qty 2

## 2016-04-24 MED ORDER — LIDOCAINE HCL (CARDIAC) 20 MG/ML IV SOLN
INTRAVENOUS | Status: DC | PRN
Start: 2016-04-24 — End: 2016-04-24
  Administered 2016-04-24: 60 mg via INTRATRACHEAL
  Administered 2016-04-24: 100 mg via INTRAVENOUS

## 2016-04-24 MED ORDER — 0.9 % SODIUM CHLORIDE (POUR BTL) OPTIME
TOPICAL | Status: DC | PRN
  Administered 2016-04-24: 2000 mL

## 2016-04-24 MED ORDER — TAMSULOSIN HCL 0.4 MG PO CAPS
0.4000 mg | ORAL_CAPSULE | Freq: Every day | ORAL | Status: DC
  Administered 2016-04-24: 0.4 mg via ORAL
  Filled 2016-04-24: qty 1

## 2016-04-24 MED ORDER — PHENYLEPHRINE 40 MCG/ML (10ML) SYRINGE FOR IV PUSH (FOR BLOOD PRESSURE SUPPORT)
PREFILLED_SYRINGE | INTRAVENOUS | Status: AC
  Filled 2016-04-24: qty 10

## 2016-04-24 MED ORDER — HYDRALAZINE HCL 20 MG/ML IJ SOLN: 5.0000 mg | INTRAMUSCULAR | Status: DC | PRN

## 2016-04-24 MED ORDER — BISACODYL 5 MG PO TBEC: 5.0000 mg | DELAYED_RELEASE_TABLET | Freq: Every day | ORAL | Status: DC | PRN

## 2016-04-24 MED ORDER — MIDAZOLAM HCL 2 MG/2ML IJ SOLN
INTRAMUSCULAR | Status: AC
Start: 2016-04-24 — End: 2016-04-24
  Administered 2016-04-24: 1 mg via INTRAVENOUS
  Filled 2016-04-24: qty 2

## 2016-04-24 MED ORDER — EPHEDRINE 5 MG/ML INJ
INTRAVENOUS | Status: AC
Start: 2016-04-24 — End: 2016-04-24
  Filled 2016-04-24: qty 10

## 2016-04-24 MED ORDER — METOPROLOL TARTRATE 5 MG/5ML IV SOLN: 2.0000 mg | INTRAVENOUS | Status: DC | PRN

## 2016-04-24 MED ORDER — DEXTROSE 5 % IV SOLN
INTRAVENOUS | Status: AC
  Administered 2016-04-24: 1.5 g via INTRAVENOUS
  Filled 2016-04-24: qty 1.5

## 2016-04-24 MED ORDER — SODIUM CHLORIDE 0.9 % IV SOLN
INTRAVENOUS | Status: DC
  Administered 2016-04-24: 18:00:00 via INTRAVENOUS

## 2016-04-24 MED ORDER — DOCUSATE SODIUM 100 MG PO CAPS
100.0000 mg | ORAL_CAPSULE | Freq: Every day | ORAL | Status: DC
  Administered 2016-04-24 – 2016-04-25 (×2): 100 mg via ORAL
  Filled 2016-04-24 (×2): qty 1

## 2016-04-24 MED ORDER — ROCURONIUM BROMIDE 100 MG/10ML IV SOLN
INTRAVENOUS | Status: DC | PRN
  Administered 2016-04-24: 50 mg via INTRAVENOUS

## 2016-04-24 MED ORDER — ACETAMINOPHEN 650 MG RE SUPP: 325.0000 mg | RECTAL | Status: DC | PRN

## 2016-04-24 MED ORDER — HYDROMORPHONE HCL 1 MG/ML IJ SOLN: 0.5000 mg | INTRAMUSCULAR | Status: DC | PRN

## 2016-04-24 MED ORDER — ALUM & MAG HYDROXIDE-SIMETH 200-200-20 MG/5ML PO SUSP: 15.0000 mL | ORAL | Status: DC | PRN

## 2016-04-24 MED ORDER — LABETALOL HCL 5 MG/ML IV SOLN
INTRAVENOUS | Status: DC | PRN
  Administered 2016-04-24: 5 mg via INTRAVENOUS

## 2016-04-24 MED ORDER — SODIUM CHLORIDE 0.9 % IV SOLN: 500.0000 mL | Freq: Once | INTRAVENOUS | Status: DC | PRN

## 2016-04-24 MED ORDER — OXYCODONE-ACETAMINOPHEN 5-325 MG PO TABS: 1.0000 | ORAL_TABLET | Freq: Four times a day (QID) | ORAL | Status: DC | PRN

## 2016-04-24 MED ORDER — CEFUROXIME SODIUM 1.5 G IJ SOLR
1.5000 g | Freq: Two times a day (BID) | INTRAMUSCULAR | Status: AC
  Administered 2016-04-24 – 2016-04-25 (×2): 1.5 g via INTRAVENOUS
  Filled 2016-04-24 (×3): qty 1.5

## 2016-04-24 MED ORDER — HEPARIN SODIUM (PORCINE) 1000 UNIT/ML IJ SOLN
INTRAMUSCULAR | Status: DC | PRN
  Administered 2016-04-24: 6000 [IU] via INTRAVENOUS

## 2016-04-24 MED ORDER — AMLODIPINE BESYLATE 2.5 MG PO TABS
2.5000 mg | ORAL_TABLET | Freq: Every day | ORAL | Status: DC
  Administered 2016-04-24 – 2016-04-25 (×2): 2.5 mg via ORAL
  Filled 2016-04-24 (×2): qty 1

## 2016-04-24 MED ORDER — LIDOCAINE HCL (PF) 1 % IJ SOLN
INTRAMUSCULAR | Status: AC
  Filled 2016-04-24: qty 30

## 2016-04-24 MED ORDER — PROTAMINE SULFATE 10 MG/ML IV SOLN
INTRAVENOUS | Status: DC | PRN
  Administered 2016-04-24: 10 mg via INTRAVENOUS
  Administered 2016-04-24 (×2): 20 mg via INTRAVENOUS

## 2016-04-24 MED ORDER — DEXTROSE 5 % IV SOLN
10.0000 mg | INTRAVENOUS | Status: DC | PRN
  Administered 2016-04-24: 20 ug/min via INTRAVENOUS

## 2016-04-24 MED ORDER — LIDOCAINE 2% (20 MG/ML) 5 ML SYRINGE
INTRAMUSCULAR | Status: AC
  Filled 2016-04-24: qty 5

## 2016-04-24 MED ORDER — PHENOL 1.4 % MT LIQD: 1.0000 | OROMUCOSAL | Status: DC | PRN

## 2016-04-24 MED ORDER — SENNOSIDES-DOCUSATE SODIUM 8.6-50 MG PO TABS: 1.0000 | ORAL_TABLET | Freq: Every evening | ORAL | Status: DC | PRN

## 2016-04-24 MED ORDER — GLYCOPYRROLATE 0.2 MG/ML IV SOSY
PREFILLED_SYRINGE | INTRAVENOUS | Status: AC
  Filled 2016-04-24: qty 3

## 2016-04-24 MED ORDER — ONDANSETRON HCL 4 MG/2ML IJ SOLN
4.0000 mg | Freq: Four times a day (QID) | INTRAMUSCULAR | Status: DC | PRN
  Administered 2016-04-25: 4 mg via INTRAVENOUS
  Filled 2016-04-24: qty 2

## 2016-04-24 MED ORDER — HYDROMORPHONE HCL 1 MG/ML IJ SOLN
0.2500 mg | INTRAMUSCULAR | Status: DC | PRN
  Administered 2016-04-24 (×4): 0.5 mg via INTRAVENOUS

## 2016-04-24 MED ORDER — EPHEDRINE SULFATE 50 MG/ML IJ SOLN
INTRAMUSCULAR | Status: DC | PRN
  Administered 2016-04-24: 5 mg via INTRAVENOUS

## 2016-04-24 MED ORDER — PROPOFOL 10 MG/ML IV BOLUS
INTRAVENOUS | Status: DC | PRN
  Administered 2016-04-24: 100 mg via INTRAVENOUS

## 2016-04-24 MED ORDER — PROTAMINE SULFATE 10 MG/ML IV SOLN
INTRAVENOUS | Status: AC
  Filled 2016-04-24: qty 5

## 2016-04-24 MED ORDER — ROSUVASTATIN CALCIUM 10 MG PO TABS
5.0000 mg | ORAL_TABLET | ORAL | Status: DC
  Filled 2016-04-24: qty 1

## 2016-04-24 MED ORDER — LABETALOL HCL 5 MG/ML IV SOLN
INTRAVENOUS | Status: AC
  Filled 2016-04-24: qty 4

## 2016-04-24 MED ORDER — ENOXAPARIN SODIUM 40 MG/0.4ML ~~LOC~~ SOLN
40.0000 mg | SUBCUTANEOUS | Status: DC
  Administered 2016-04-25: 40 mg via SUBCUTANEOUS
  Filled 2016-04-24: qty 0.4

## 2016-04-24 MED ORDER — FENTANYL CITRATE (PF) 100 MCG/2ML IJ SOLN
INTRAMUSCULAR | Status: DC | PRN
  Administered 2016-04-24 (×3): 50 ug via INTRAVENOUS
  Administered 2016-04-24: 100 ug via INTRAVENOUS

## 2016-04-24 MED ORDER — GUAIFENESIN-DM 100-10 MG/5ML PO SYRP: 15.0000 mL | ORAL_SOLUTION | ORAL | Status: DC | PRN

## 2016-04-24 MED ORDER — LABETALOL HCL 5 MG/ML IV SOLN
10.0000 mg | INTRAVENOUS | Status: DC | PRN
  Administered 2016-04-24: 10 mg via INTRAVENOUS

## 2016-04-24 MED ORDER — ACETAMINOPHEN 325 MG PO TABS
325.0000 mg | ORAL_TABLET | ORAL | Status: DC | PRN
  Administered 2016-04-24: 650 mg via ORAL
  Filled 2016-04-24: qty 2

## 2016-04-24 MED ORDER — MEPERIDINE HCL 25 MG/ML IJ SOLN: 6.2500 mg | INTRAMUSCULAR | Status: DC | PRN

## 2016-04-24 MED ORDER — LACTATED RINGERS IV SOLN
INTRAVENOUS | Status: DC | PRN
  Administered 2016-04-24 (×2): via INTRAVENOUS

## 2016-04-24 MED ORDER — PANTOPRAZOLE SODIUM 40 MG PO TBEC
40.0000 mg | DELAYED_RELEASE_TABLET | Freq: Every day | ORAL | Status: DC
  Administered 2016-04-25: 40 mg via ORAL
  Filled 2016-04-24: qty 1

## 2016-04-24 MED ORDER — PROPOFOL 10 MG/ML IV BOLUS
INTRAVENOUS | Status: AC
  Filled 2016-04-24: qty 20

## 2016-04-24 MED ORDER — HEPARIN SODIUM (PORCINE) 1000 UNIT/ML IJ SOLN
INTRAMUSCULAR | Status: AC
  Filled 2016-04-24: qty 1

## 2016-04-24 MED ORDER — ONDANSETRON HCL 4 MG/2ML IJ SOLN
INTRAMUSCULAR | Status: DC | PRN
  Administered 2016-04-24: 4 mg via INTRAVENOUS

## 2016-04-24 MED ORDER — FENTANYL CITRATE (PF) 250 MCG/5ML IJ SOLN
INTRAMUSCULAR | Status: AC
  Filled 2016-04-24: qty 5

## 2016-04-24 MED ORDER — SUGAMMADEX SODIUM 200 MG/2ML IV SOLN
INTRAVENOUS | Status: DC | PRN
  Administered 2016-04-24: 175 mg via INTRAVENOUS

## 2016-04-24 MED ORDER — ONDANSETRON HCL 4 MG/2ML IJ SOLN
4.0000 mg | Freq: Once | INTRAMUSCULAR | Status: AC | PRN
  Administered 2016-04-24: 4 mg via INTRAVENOUS

## 2016-04-24 MED ORDER — MAGNESIUM SULFATE 2 GM/50ML IV SOLN: 2.0000 g | Freq: Every day | INTRAVENOUS | Status: DC | PRN

## 2016-04-24 MED ORDER — SUGAMMADEX SODIUM 200 MG/2ML IV SOLN
INTRAVENOUS | Status: AC
  Filled 2016-04-24: qty 2

## 2016-04-24 SURGICAL SUPPLY — 43 items
CANISTER SUCTION 2500CC (MISCELLANEOUS) ×2 IMPLANT
CATH ROBINSON RED A/P 18FR (CATHETERS) ×2 IMPLANT
CATH SUCT 10FR WHISTLE TIP (CATHETERS) ×2 IMPLANT
CLIP TI MEDIUM 24 (CLIP) ×2 IMPLANT
CLIP TI WIDE RED SMALL 24 (CLIP) ×2 IMPLANT
CRADLE DONUT ADULT HEAD (MISCELLANEOUS) ×2 IMPLANT
DECANTER SPIKE VIAL GLASS SM (MISCELLANEOUS) IMPLANT
DRAIN HEMOVAC 1/8 X 5 (WOUND CARE) IMPLANT
DRSG COVADERM 4X8 (GAUZE/BANDAGES/DRESSINGS) ×1 IMPLANT
ELECT REM PT RETURN 9FT ADLT (ELECTROSURGICAL) ×2
ELECTRODE REM PT RTRN 9FT ADLT (ELECTROSURGICAL) ×1 IMPLANT
EVACUATOR SILICONE 100CC (DRAIN) IMPLANT
GAUZE SPONGE 4X4 12PLY STRL (GAUZE/BANDAGES/DRESSINGS) ×2 IMPLANT
GLOVE BIOGEL PI IND STRL 7.5 (GLOVE) IMPLANT
GLOVE BIOGEL PI INDICATOR 7.5 (GLOVE) ×2
GLOVE ECLIPSE 7.0 STRL STRAW (GLOVE) ×1 IMPLANT
GLOVE ECLIPSE 7.5 STRL STRAW (GLOVE) ×2 IMPLANT
GLOVE SS BIOGEL STRL SZ 7 (GLOVE) ×1 IMPLANT
GLOVE SUPERSENSE BIOGEL SZ 7 (GLOVE) ×1
GLOVE SURG SS PI 7.5 STRL IVOR (GLOVE) ×1 IMPLANT
GOWN STRL REUS W/ TWL LRG LVL3 (GOWN DISPOSABLE) ×3 IMPLANT
GOWN STRL REUS W/ TWL XL LVL3 (GOWN DISPOSABLE) IMPLANT
GOWN STRL REUS W/TWL LRG LVL3 (GOWN DISPOSABLE) ×6
GOWN STRL REUS W/TWL XL LVL3 (GOWN DISPOSABLE) ×4 IMPLANT
INSERT FOGARTY SM (MISCELLANEOUS) ×2 IMPLANT
KIT BASIN OR (CUSTOM PROCEDURE TRAY) ×2 IMPLANT
KIT ROOM TURNOVER OR (KITS) ×2 IMPLANT
NEEDLE 22X1 1/2 (OR ONLY) (NEEDLE) IMPLANT
NS IRRIG 1000ML POUR BTL (IV SOLUTION) ×4 IMPLANT
PACK CAROTID (CUSTOM PROCEDURE TRAY) ×2 IMPLANT
PAD ARMBOARD 7.5X6 YLW CONV (MISCELLANEOUS) ×4 IMPLANT
PATCH HEMASHIELD 8X75 (Vascular Products) ×1 IMPLANT
SHUNT CAROTID BYPASS 12FRX15.5 (VASCULAR PRODUCTS) IMPLANT
SPONGE INTESTINAL PEANUT (DISPOSABLE) ×2 IMPLANT
SUT PROLENE 6 0 CC (SUTURE) ×3 IMPLANT
SUT SILK 2 0 FS (SUTURE) ×2 IMPLANT
SUT SILK 3 0 TIES 17X18 (SUTURE)
SUT SILK 3-0 18XBRD TIE BLK (SUTURE) IMPLANT
SUT VIC AB 2-0 CT1 27 (SUTURE) ×2
SUT VIC AB 2-0 CT1 TAPERPNT 27 (SUTURE) ×1 IMPLANT
SUT VIC AB 3-0 X1 27 (SUTURE) ×2 IMPLANT
SYR CONTROL 10ML LL (SYRINGE) IMPLANT
WATER STERILE IRR 1000ML POUR (IV SOLUTION) ×2 IMPLANT

## 2016-04-24 NOTE — Telephone Encounter (Signed)
sched appt 5/16 at 8:30. Lm on hm# to inform pt of appt.

## 2016-04-24 NOTE — Telephone Encounter (Signed)
-----   Message from Mena Goes, RN sent at 04/24/2016 11:04 AM EDT ----- Regarding: schedule   ----- Message -----    From: Ulyses Amor, PA-C    Sent: 04/24/2016   9:43 AM      To: [facility] Charge Pool  F/U with Dr. Kellie Simmering in 2 weeks s/p left CEA

## 2016-04-24 NOTE — Anesthesia Preprocedure Evaluation (Addendum)
Anesthesia Evaluation  Patient identified by MRN, date of birth, ID band Patient awake    Reviewed: Allergy & Precautions, NPO status , Patient's Chart, lab work & pertinent test results  History of Anesthesia Complications Negative for: history of anesthetic complications  Airway Mallampati: II  TM Distance: >3 FB Neck ROM: Full    Dental  (+) Upper Dentures, Dental Advisory Given   Pulmonary former smoker,    Pulmonary exam normal        Cardiovascular hypertension, Pt. on medications + CAD, + Past MI, + CABG and +CHF  Normal cardiovascular exam Rate:Normal     Neuro/Psych negative neurological ROS  negative psych ROS   GI/Hepatic Neg liver ROS, GERD  Controlled,  Endo/Other    Renal/GU      Musculoskeletal   Abdominal   Peds  Hematology   Anesthesia Other Findings   Reproductive/Obstetrics negative OB ROS                            Anesthesia Physical Anesthesia Plan  ASA: III  Anesthesia Plan: General   Post-op Pain Management:    Induction: Intravenous  Airway Management Planned: Oral ETT  Additional Equipment: Arterial line  Intra-op Plan:   Post-operative Plan: Extubation in OR  Informed Consent: I have reviewed the patients History and Physical, chart, labs and discussed the procedure including the risks, benefits and alternatives for the proposed anesthesia with the patient or authorized representative who has indicated his/her understanding and acceptance.     Plan Discussed with: CRNA and Surgeon  Anesthesia Plan Comments:         Anesthesia Quick Evaluation

## 2016-04-24 NOTE — Interval H&P Note (Signed)
History and Physical Interval Note:  04/24/2016 7:31 AM  Ryan [name]  has presented today for surgery, with the diagnosis of Left carotid artery stenosis I65.22  The various methods of treatment have been discussed with the patient and family. After consideration of risks, benefits and other options for treatment, the patient has consented to  Procedure(s): ENDARTERECTOMY CAROTID (Left) as a surgical intervention .  The patient's history has been reviewed, patient examined, no change in status, stable for surgery.  I have reviewed the patient's chart and labs.  Questions were answered to the patient's satisfaction.     Tinnie Gens

## 2016-04-24 NOTE — Anesthesia Postprocedure Evaluation (Signed)
Anesthesia Post Note  Patient: [name]  Procedure(s) Performed: Procedure(s) (LRB): ENDARTERECTOMY LEFT CAROTID (Left) LEFT CAROTID ARTERY PATCH ANGIOPLASTY (Left)  Patient location during evaluation: PACU Anesthesia Type: General Level of consciousness: awake and alert Pain management: pain level controlled Vital Signs Assessment: post-procedure vital signs reviewed and stable Respiratory status: spontaneous breathing, nonlabored ventilation, respiratory function stable and patient connected to nasal cannula oxygen Cardiovascular status: blood pressure returned to baseline and stable Postop Assessment: no signs of nausea or vomiting Anesthetic complications: no    Last Vitals:  Filed Vitals:   04/24/16 1557 04/24/16 1558  BP: 165/78   Pulse: 89   Temp:  36.6 C  Resp: 18     Last Pain:  Filed Vitals:   04/24/16 1604  PainSc: Clarissa                 Samhita Kretsch DAVID

## 2016-04-24 NOTE — Transfer of Care (Signed)
Immediate Anesthesia Transfer of Care Note  Patient: Ryan [name]  Procedure(s) Performed: Procedure(s): ENDARTERECTOMY LEFT CAROTID (Left) LEFT CAROTID ARTERY PATCH ANGIOPLASTY (Left)  Patient Location: PACU  Anesthesia Type:General  Level of Consciousness: awake, alert , oriented and patient cooperative  Airway & Oxygen Therapy: Patient Spontanous Breathing and Patient connected to nasal cannula oxygen  Post-op Assessment: Report given to RN and Post -op Vital signs reviewed and stable  Post vital signs: Reviewed  Last Vitals:  Filed Vitals:   04/24/16 0548  BP: 175/84  Pulse: 74  Temp: 36.4 C  Resp: 18    Last Pain: There were no vitals filed for this visit.    Patients Stated Pain Goal: 2 ([id number])  Complications: No apparent anesthesia complications

## 2016-04-24 NOTE — H&P (View-Only) (Signed)
Subjective:     Patient ID: Ryan [name], male   DOB: [birth year], 75 y.o.   MRN: [id number]  HPI This 75 year old male seen today in continued follow-up regarding his left carotid occlusive disease. He was last seen 9 months ago and had a 70-75% asymptomatic stenosis. She denies any history of stroke, TIAs, or other neurologic symptoms. He did have coronary artery bypass grafting performed 04/05/2015 by Dr. Elita Quick has done quite well. He does have some bilateral hip claudication symptoms. He is not on anticoagulants.  Past Medical History  Diagnosis Date  . Arthritis   . Hyperlipidemia     takes Crestor daily  . Peripheral vascular disease ([place])   . Carotid artery occlusion   . Hypertension     takes Amlodipine daily  . Chronic back pain   . History of gout     has colchicine prn  . History of colon polyps   . Urinary frequency   . Urinary urgency   . Enlarged prostate     takes Rapaflo daily  . History of kidney stones   . Hyperthyroidism     takes SYnthroid daily as instructed  . Cataract     Bil eyes/worse in left eye  . GERD (gastroesophageal reflux disease)     occasional  . Pulmonary emboli ([place]) 03/20/2015    elevated d-dimer, intermediate V/Q study, atypical chest pain and SOB. Start on Xarelto 20mg  BID for 3 month  . Renal insufficiency   . Myocardial infarction ([place])   . DVT (deep venous thrombosis) ([place])   . CAD (coronary artery disease)   . CHF (congestive heart failure) ([place])     Social History  Substance Use Topics  . Smoking status: Former Smoker    Types: Cigarettes    Quit date: 02/04/1987  . Smokeless tobacco: Former Systems developer    Types: Chew     Comment: quit 35+yrs ago  . Alcohol Use: No    Family History  Problem Relation Age of Onset  . Heart disease Father   . Heart attack Father   . Heart disease Sister   . Hypertension Sister   . Heart attack Sister   . Hypertension Mother   . Diabetes Son     Allergies  Allergen Reactions  .  Codeine Nausea And Vomiting       . Zetia [Ezetimibe] Other (See Comments)    Myalgias      Current outpatient prescriptions:  .  amLODipine (NORVASC) 5 MG tablet, Take 5 mg by mouth daily., Disp: , Rfl:  .  aspirin 81 MG chewable tablet, Chew 1 tablet (81 mg total) by mouth daily., Disp: , Rfl:  .  docusate sodium (COLACE) 100 MG capsule, Take 100 mg by mouth daily., Disp: , Rfl:  .  rosuvastatin (CRESTOR) 5 MG tablet, Take 1 tablet (5 mg total) by mouth 3 (three) times a week., Disp: 15 tablet, Rfl: 3 .  silodosin (RAPAFLO) 8 MG CAPS capsule, Take 8 mg by mouth at bedtime. , Disp: , Rfl:  .  SYNTHROID 50 MCG tablet, Take 50 mcg by mouth daily before breakfast. , Disp: , Rfl:   Filed Vitals:   04/14/16 1223  BP: 140/76  Pulse: 70  Temp: 98 F (36.7 C)  Resp: 16  Height: 5\' 7"  (1.702 m)  Weight: 179 lb (81.194 kg)  SpO2: 100%    Body mass index is 28.03 kg/(m^2).           Review of Systems  Denies chest pain, dyspnea on exertion, PND, orthopnea , hemoptysis. Does take anti-lipid medication. Was thought to have pulmonary embolus in year ago when his coronary artery disease was diagnosed in it was determined that he did have a non-STEMI and then underwent coronary artery bypass grafting. It appeard  that he had a pulmonary embolus  But that turned out not to be the case. He had an uneventful postoperative course following his CABG. He recently saw Dr. [name]  -his cardiologist who I spoke with by phone today and states that he is doing quite well and is low risk for surgery.   Past Medical History  Diagnosis Date  . Arthritis   . Hyperlipidemia     takes Crestor daily  . Peripheral vascular disease ([place])   . Carotid artery occlusion   . Hypertension     takes Amlodipine daily  . Chronic back pain   . History of gout     has colchicine prn  . History of colon polyps   . Urinary frequency   . Urinary urgency   . Enlarged prostate     takes Rapaflo daily  .  History of kidney stones   . Hyperthyroidism     takes SYnthroid daily as instructed  . Cataract     Bil eyes/worse in left eye  . GERD (gastroesophageal reflux disease)     occasional  . Pulmonary emboli ([place]) 03/20/2015    elevated d-dimer, intermediate V/Q study, atypical chest pain and SOB. Start on Xarelto 20mg  BID for 3 month  . Renal insufficiency   . Myocardial infarction ([place])   . DVT (deep venous thrombosis) ([place])   . CAD (coronary artery disease)   . CHF (congestive heart failure) ([place])     Social History  Substance Use Topics  . Smoking status: Former Smoker    Types: Cigarettes    Quit date: 02/04/1987  . Smokeless tobacco: Former Systems developer    Types: Chew     Comment: quit 35+yrs ago  . Alcohol Use: No    Family History  Problem Relation Age of Onset  . Heart disease Father   . Heart attack Father   . Heart disease Sister   . Hypertension Sister   . Heart attack Sister   . Hypertension Mother   . Diabetes Son     Allergies  Allergen Reactions  . Codeine Nausea And Vomiting       . Zetia [Ezetimibe] Other (See Comments)    Myalgias      Current outpatient prescriptions:  .  amLODipine (NORVASC) 5 MG tablet, Take 5 mg by mouth daily., Disp: , Rfl:  .  aspirin 81 MG chewable tablet, Chew 1 tablet (81 mg total) by mouth daily., Disp: , Rfl:  .  docusate sodium (COLACE) 100 MG capsule, Take 100 mg by mouth daily., Disp: , Rfl:  .  rosuvastatin (CRESTOR) 5 MG tablet, Take 1 tablet (5 mg total) by mouth 3 (three) times a week., Disp: 15 tablet, Rfl: 3 .  silodosin (RAPAFLO) 8 MG CAPS capsule, Take 8 mg by mouth at bedtime. , Disp: , Rfl:  .  SYNTHROID 50 MCG tablet, Take 50 mcg by mouth daily before breakfast. , Disp: , Rfl:   Filed Vitals:   04/14/16 1223  BP: 140/76  Pulse: 70  Temp: 98 F (36.7 C)  Resp: 16  Height: 5\' 7"  (1.702 m)  Weight: 179 lb (81.194 kg)  SpO2: 100%    Body mass index  is 28.03 kg/(m^2).       Denies chest pain, dyspnea  on exertion, PND, orthopnea, hemoptysis, lateralizing weakness, claudication. Other systems negative and complete review of systems       Objective:   Physical Exam BP 140/76 mmHg  Pulse 70  Temp(Src) 98 F (36.7 C)  Resp 16  Ht 5\' 7"  (1.702 m)  Wt 179 lb (81.194 kg)  BMI 28.03 kg/m2  SpO2 100%    Gen.-alert and oriented x3 in no apparent distress HEENT normal for age Lungs no rhonchi or wheezing Cardiovascular regular rhythm no murmurs carotid pulses 3+ palpable no bruits audible Abdomen soft nontender no palpable masses Musculoskeletal free of  major deformities Skin clear -no rashes Neurologic normal Lower extremities 3+ femoral and dorsalis pedis pulses palpable bilaterally with no edema   today I ordered a carotid duplex exam which I reviewed and interpreted and compared to the most recent study 9 months ago. There has been a significant progression of his left internal carotid stenosis to now approximating 90-95% with minimal right ICA stenosis.     Assessment:      90-95% left ICA stenosis-asymptomatic    coronary artery disease  -status post coronary artery bypass grafting April 2016 doing well   GERD Hypertension   hyper thyroidism -takes Synthroid    Plan:      discussed options with patient and have recommended left carotid endarterectomy I spoke with Dr. [name]  Who feels the patient is low risk for surgery and does not need further cardiac clearance  Patient would like to proceed with left carotid endarterectomy Have scheduled that for Friday, May 5 Wrist and benefits thoroughly discussed with patient including risk of stroke and he  Would like to proceed.

## 2016-04-24 NOTE — Op Note (Signed)
OPERATIVE REPORT  Date of Surgery: 04/24/2016  Surgeon: Tinnie Gens, MD  Assistant: Dr. Annamarie Major, Gerri Lins PA  Pre-op Diagnosis: Left carotid artery stenosis I65.22  Post-op Diagnosis: Left carotid artery stenosis I65.22  Procedure: Procedure(s): ENDARTERECTOMY LEFT CAROTID LEFT CAROTID ARTERY PATCH ANGIOPLASTY  Anesthesia: General  EBL: 50 cc  Complications: None  Procedure Details: The patient was taken to the operating room and placed in the supine position. Following induction of satisfactory general endotracheal anesthesia the left neck was prepped and draped in a routine sterile manner. Incision was made on the anterior border of the sternocleidomastoid muscle and carried down through the subcutaneous tissue and platysma using the Bovie. Care was taken not to injure the hypoglossal nerve.. The common internal and external carotid arteries were dissected free. There was a calcified atherosclerotic plaque at the carotid bifurcation extending up the internal carotid artery. A #10 shunt was then prepared and the patient was heparinized. The carotid vessels were occluded with vascular clamps. A longitudinal opening was made in the common carotid with a 15 blade extended up the internal carotid with the Potts scissors to a point distal to the disease. The plaque was approximately 95 % stenotic in severity. The distal vessel appeared normal. The plaque did extend above the crossing of the hypoglossal nerve which was gently retracted away during the procedure Shunt was inserted without difficulty reestablishing flow in about 2 minutes. A standard endarterectomy was performed with an eversion endarterectomy of the external carotid. The plaque feathered off  the distal internal carotid artery nicely not requiring any tacking sutures. The lumen was thoroughly irrigated with heparinized saline and loose debris all carefully removed. The arterotomy was then closed with a patch using  continuous 6-0 Prolene. Prior to completion of the  Closure the  shunt was removed after approximately 30 minutes of shunt time. Flow was then reestablished up the external branch initially followed by the internal branch. Protamine was given to her reverse the heparin.Following adequate hemostasis the wound was irrigated with saline and closed in layers with Vicryl ain a subcuticular fashion. Sterile dressing was applied and the patient taken to the recovery room in stable condition.  Tinnie Gens, MD 04/24/2016 9:41 AM

## 2016-04-24 NOTE — Anesthesia Procedure Notes (Signed)
Procedure Name: Intubation Date/Time: 04/24/2016 8:11 AM Performed by: Luciana Axe K Pre-anesthesia Checklist: Patient identified, Emergency Drugs available, Suction available, Patient being monitored and Timeout performed Patient Re-evaluated:Patient Re-evaluated prior to inductionOxygen Delivery Method: Circle system utilized Preoxygenation: Pre-oxygenation with 100% oxygen Intubation Type: IV induction Ventilation: Mask ventilation without difficulty Laryngoscope Size: Miller and 3 Grade View: Grade I Tube type: Oral Tube size: 7.5 mm Number of attempts: 1 Airway Equipment and Method: Stylet and LTA kit utilized Placement Confirmation: ETT inserted through vocal cords under direct vision,  positive ETCO2,  CO2 detector and breath sounds checked- equal and bilateral Secured at: 22 cm Tube secured with: Tape Dental Injury: Teeth and Oropharynx as per pre-operative assessment

## 2016-04-25 LAB — CBC
HCT: 39 % (ref 39.0–52.0)
Hemoglobin: 12.7 g/dL — ABNORMAL LOW (ref 13.0–17.0)
MCH: 30.6 pg (ref 26.0–34.0)
MCHC: 32.6 g/dL (ref 30.0–36.0)
MCV: 94 fL (ref 78.0–100.0)
Platelets: 227 10*3/uL (ref 150–400)
RBC: 4.15 MIL/uL — ABNORMAL LOW (ref 4.22–5.81)
RDW: 13 % (ref 11.5–15.5)
WBC: 11.9 10*3/uL — ABNORMAL HIGH (ref 4.0–10.5)

## 2016-04-25 LAB — BASIC METABOLIC PANEL
Anion gap: 11 (ref 5–15)
BUN: 11 mg/dL (ref 6–20)
CALCIUM: 8.3 mg/dL — AB (ref 8.9–10.3)
CO2: 24 mmol/L (ref 22–32)
CREATININE: 1.09 mg/dL (ref 0.61–1.24)
Chloride: 104 mmol/L (ref 101–111)
GFR calc Af Amer: >60 mL/min (ref >60)
GLUCOSE: 114 mg/dL — AB (ref 65–99)
POTASSIUM: 3.6 mmol/L (ref 3.5–5.1)
SODIUM: 139 mmol/L (ref 135–145)

## 2016-04-25 NOTE — Progress Notes (Signed)
    Subjective  - POD #1, s/p LCEA  Ready to go home Eating and walking ok   Physical Exam:  Incision c/d/i Neuro intact Smile symmetric, tongue midline       Assessment/Plan:  POD #1  Stable for d/c home F/u JDL 2 weeks  Lehi Phifer, [place] 04/25/2016 8:35 AM --  Filed Vitals:   04/25/16 0745 04/25/16 0754  BP: 116/69   Pulse: 112   Temp:  97.9 F (36.6 C)  Resp: 20     Intake/Output Summary (Last 24 hours) at 04/25/16 0835 Last data filed at 04/25/16 [id number]  Gross per 24 hour  Intake 2387.08 ml  Output   2563 ml  Net -175.92 ml     Laboratory CBC    Component Value Date/Time   WBC 11.9* 04/25/2016 0420   HGB 12.7* 04/25/2016 0420   HCT 39.0 04/25/2016 0420   PLT 227 04/25/2016 0420    BMET    Component Value Date/Time   NA 139 04/25/2016 0420   K 3.6 04/25/2016 0420   CL 104 04/25/2016 0420   CO2 24 04/25/2016 0420   GLUCOSE 114* 04/25/2016 0420   BUN 11 04/25/2016 0420   CREATININE 1.09 04/25/2016 0420   CREATININE 1.04 12/26/2015 0812   CALCIUM 8.3* 04/25/2016 0420   GFRNONAA >60 04/25/2016 0420   GFRAA >60 04/25/2016 0420    COAG Lab Results  Component Value Date   INR 1.09 04/21/2016   INR 1.36 04/05/2015   INR 1.04 03/21/2015   No results found for: PTT  Antibiotics Anti-infectives    Start     Dose/Rate Route Frequency Ordered Stop   04/24/16 2000  cefUROXime (ZINACEF) 1.5 g in dextrose 5 % 50 mL IVPB     1.5 g 100 mL/hr over 30 Minutes Intravenous Every 12 hours 04/24/16 1612 04/25/16 1959   04/24/16 0530  dextrose 5 % with cefUROXime (ZINACEF) ADS Med    Comments:  Beverly Gust   : cabinet override      04/24/16 0530 04/24/16 0814       V. Leia Alf, M.D. [facility] Office: [phone] Pager:  [phone]

## 2016-04-25 NOTE — Progress Notes (Signed)
Patient discharge instructions reviewed with patient and wife [name] who is at bedside. Prescription given to patient. Appointment information reviewed. S/s of infections reviewed and both verbalize understanding of instructions. Patient was able to ambulate without any issues, gait stable. PIV removed.    Pt in no acute distress, VSS, pt discharged via wheelchair.

## 2016-04-27 ENCOUNTER — Encounter (HOSPITAL_COMMUNITY): Payer: Self-pay | Admitting: Vascular Surgery

## 2016-04-28 NOTE — Discharge Summary (Signed)
[facility] Discharge Summary  Ryan [name] 1941/06/27 75 y.o. male  [id number]  Admission Date: 04/24/2016  Discharge Date: 04/25/2016  Physician: Tinnie Gens, MD  Admission Diagnosis: Left carotid artery stenosis I65.22  HPI:   This is a 75 y.o. male who presented for continued follow-up regarding his left carotid occlusive disease. He was last seen 9 months ago and had a 70-75% asymptomatic stenosis. She denies any history of stroke, TIAs, or other neurologic symptoms. He did have coronary artery bypass grafting performed 04/05/2015 by Dr. Roxan Hockey and has done quite well. He does have some bilateral hip claudication symptoms. He is not on anticoagulants.  Hospital Course:  The patient was admitted to the hospital and taken to the operating room on 04/24/2016 and underwent left carotid endarterectomy.  The patient tolerated the procedure well and was transported to the PACU in stable condition.  By POD 1, the patient's neuro status was intact. His incision was without hematoma. He was tolerating a diet and ambulating without difficulty. He was discharged home on POD 1 in good condition.    No results for input(s): NA, K, CL, CO2, GLUCOSE, BUN, CALCIUM in the last 72 hours.  Invalid input(s): CRATININE No results for input(s): WBC, HGB, HCT, PLT in the last 72 hours. No results for input(s): INR in the last 72 hours.  Discharge Instructions:   The patient is discharged to home with extensive instructions on wound care and progressive ambulation.  They are instructed not to drive or perform any heavy lifting until returning to see the physician in his office.  Discharge Instructions    Call MD for:  redness, tenderness, or signs of infection (pain, swelling, bleeding, redness, odor or green/yellow discharge around incision site)    Complete by:  As directed      Call MD for:  severe or increased pain, loss or decreased feeling  in affected limb(s)     Complete by:  As directed      Call MD for:  temperature >100.5    Complete by:  As directed      Driving Restrictions    Complete by:  As directed   No driving for 1 week     Increase activity slowly    Complete by:  As directed   Walk with assistance use walker or cane as needed     Lifting restrictions    Complete by:  As directed   No heavy lifting for 6 weeks     Resume previous diet    Complete by:  As directed            Discharge Diagnosis:  Left carotid artery stenosis I65.22  Secondary Diagnosis: Patient Active Problem List   Diagnosis Date Noted  . PAD (peripheral artery disease) ([place]) 07/09/2015  . Carotid stenosis, asymptomatic 07/09/2015  . S/P CABG x 4 04/05/2015  . Pain in the chest   . Coronary artery disease due to calcified coronary lesion   . Atypical chest pain 03/20/2015  . Carotid stenosis 10/09/2014  . PVD (peripheral vascular disease) ([place]) 10/09/2014  . HTN (hypertension) 05/30/2014  . Arthritis of left hip 01/09/2014  . Status post THR (total hip replacement) 01/09/2014  . Spinal stenosis, lumbar 01/06/2013    Class: Diagnosis of  . Coronary artery disease 01/05/2013  . Atherosclerosis of native artery of extremity with intermittent claudication ([place]) 10/18/2012   Past Medical History  Diagnosis Date  . Arthritis   . Hyperlipidemia  takes Crestor daily  . Peripheral vascular disease ([place])   . Carotid artery occlusion   . Hypertension     takes Amlodipine daily  . Chronic back pain   . History of gout     has colchicine prn  . History of colon polyps   . Urinary frequency   . Urinary urgency   . Enlarged prostate     takes Rapaflo daily  . History of kidney stones   . Hyperthyroidism     takes SYnthroid daily as instructed  . Cataract     Bil eyes/worse in left eye  . GERD (gastroesophageal reflux disease)     occasional  . Pulmonary emboli ([place]) 03/20/2015    elevated d-dimer, intermediate V/Q study, atypical chest pain  and SOB. Start on Xarelto 20mg  BID for 3 month  . Renal insufficiency   . Myocardial infarction ([place])   . DVT (deep venous thrombosis) ([place])   . CAD (coronary artery disease)   . CHF (congestive heart failure) ([place])   . Dysrhythmia   . Shortness of breath dyspnea       Medication List    TAKE these medications        amLODipine 5 MG tablet  Commonly known as:  NORVASC  Take 2.5 mg by mouth daily.     aspirin 81 MG chewable tablet  Chew 1 tablet (81 mg total) by mouth daily.     diclofenac sodium 1 % Gel  Commonly known as:  VOLTAREN  Apply 1 application topically 3 (three) times daily as needed.     docusate sodium 100 MG capsule  Commonly known as:  COLACE  Take 100 mg by mouth daily.     oxyCODONE-acetaminophen 5-325 MG tablet  Commonly known as:  PERCOCET/ROXICET  Take 1 tablet by mouth every 6 (six) hours as needed.     rosuvastatin 5 MG tablet  Commonly known as:  CRESTOR  Take 1 tablet (5 mg total) by mouth 3 (three) times a week.     silodosin 8 MG Caps capsule  Commonly known as:  RAPAFLO  Take 8 mg by mouth at bedtime.       Percocet #15 No Refill  Disposition: Home  Patient's condition: is Good  Follow up: 1. Dr. Kellie Simmering in 2 weeks.   Virgina Jock, PA-C [facility] [phone]  --- For [facility] use --- Instructions: Press F2 to tab through selections.  Delete question if not applicable.   Modified Rankin score at D/C (0-6): 0  IV medication needed for:  1. Hypertension: No 2. Hypotension: No  Post-op Complications: No  1. Post-op CVA or TIA: No  2. CN injury: No   3. Myocardial infarction: No  4.  CHF: No  5.  Dysrhythmia (new): No  6. Wound infection: No  7. Reperfusion symptoms: No  8. Return to OR: No  Discharge medications: Statin use:  Yes If No: [ ]  For Medical reasons, [ ]  Non-compliant, [ ]  Not-indicated ASA use:  Yes  If No: [ ]  For Medical reasons, [ ]  Non-compliant, [ ]   Not-indicated Beta blocker use:  No If No: [ ]  For Medical reasons, [ ]  Non-compliant, [ x] Not-indicated ACE-Inhibitor use:  No If No: [ ]  For Medical reasons, [ ]  Non-compliant, [x ] Not-indicated P2Y12 Antagonist use: No, [ ]  Plavix, [ ]  Plasugrel, [ ]  Ticlopinine, [ ]  Ticagrelor, [ ]  Other, [ ]  No for medical reason, [ ]  Non-compliant, [x ] Not-indicated  Anti-coagulant use:  No, [ ]  Warfarin, [ ]  Rivaroxaban, [ ]  Dabigatran, [ ]  Other, [ ]  No for medical reason, [ ]  Non-compliant, [x ] Not-indicated

## 2016-05-04 ENCOUNTER — Encounter: Payer: Self-pay | Admitting: Vascular Surgery

## 2016-05-04 IMAGING — CT CT ANGIO CHEST
2 of 9 series · 19 of 46 positions shown · IV contrast (OMNI)
Comparison: Radiographs April 01, 2015.

CLINICAL DATA: Acute chest pain, shortness of breath.

EXAM:
CT ANGIOGRAPHY CHEST WITH CONTRAST
TECHNIQUE: Multidetector CT imaging of the chest was performed using the
standard protocol during bolus administration of intravenous
contrast. Multiplanar CT image reconstructions and MIPs were
obtained to evaluate the vascular anatomy.
CONTRAST:  80mL OMNIPAQUE IOHEXOL 350 MG/ML SOLN

[Series 5: thins · axial · 0.76mm/px · z∈[-238,-14]mm · 16 of 254 slices shown]
[im 15/254  lung]
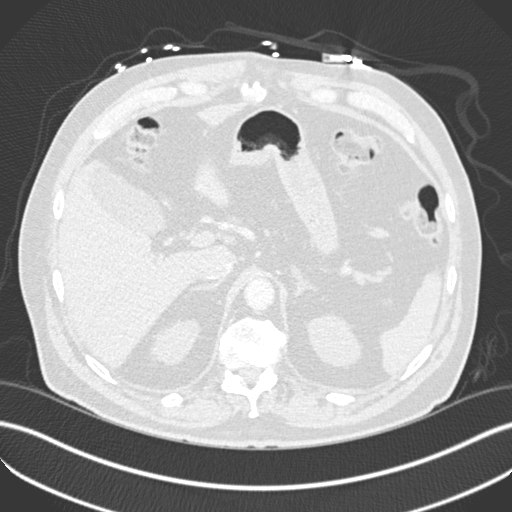
[im 29/254  soft-tissue]
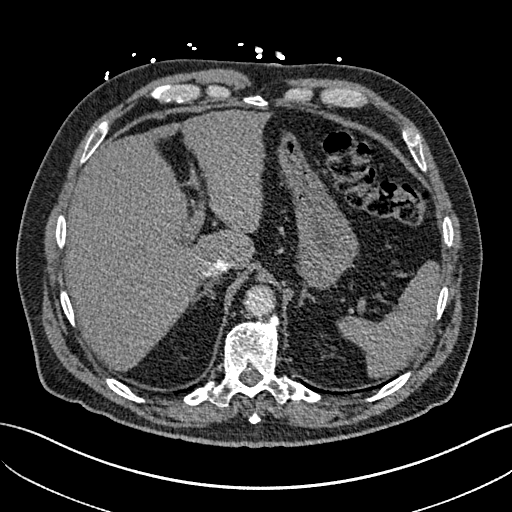
[im 43/254  lung]
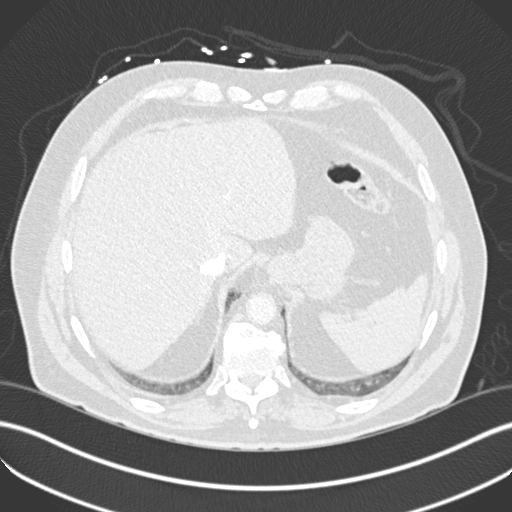
[im 57/254  soft-tissue]
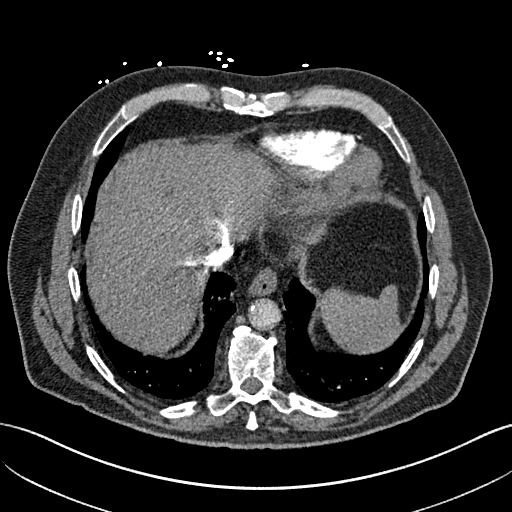
[im 71/254  lung]
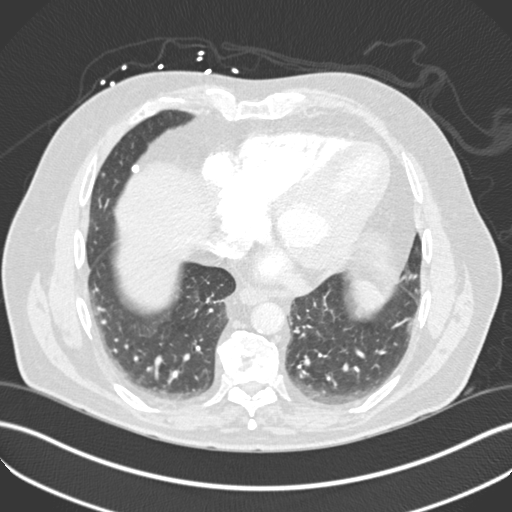
[im 85/254  soft-tissue]
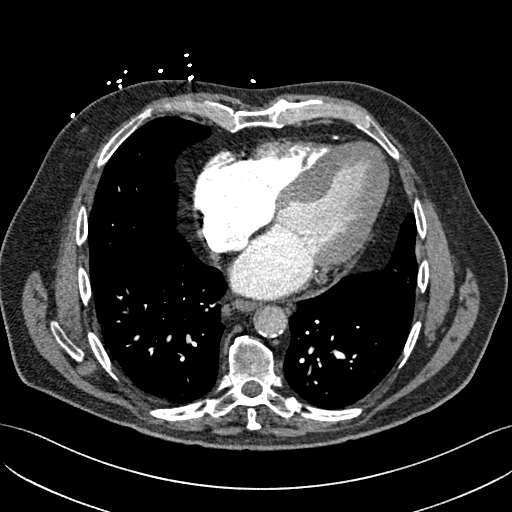
[im 99/254  lung]
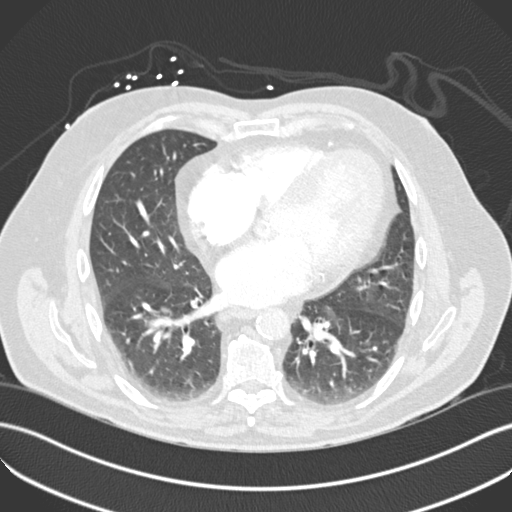
[im 113/254  soft-tissue]
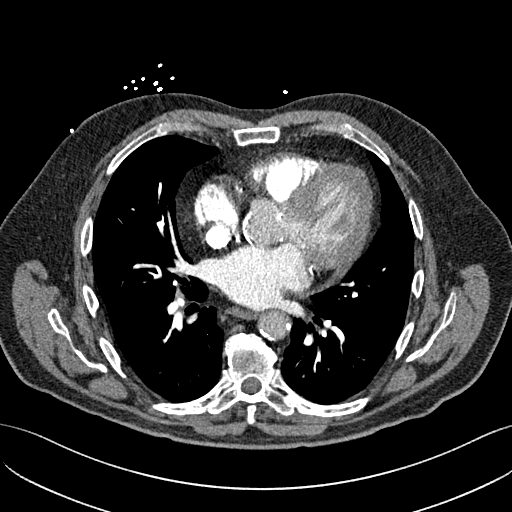
[im 141/254  lung]
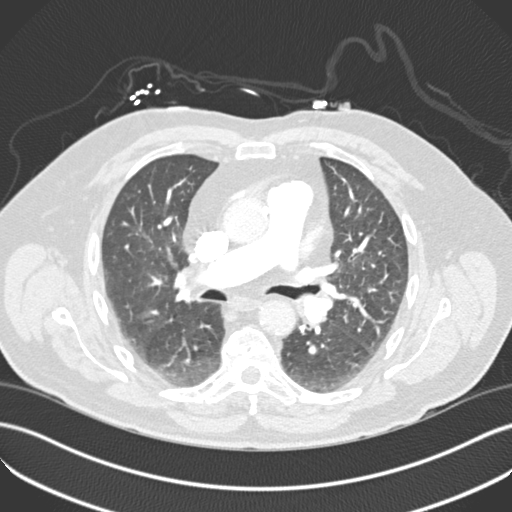
[im 155/254  soft-tissue]
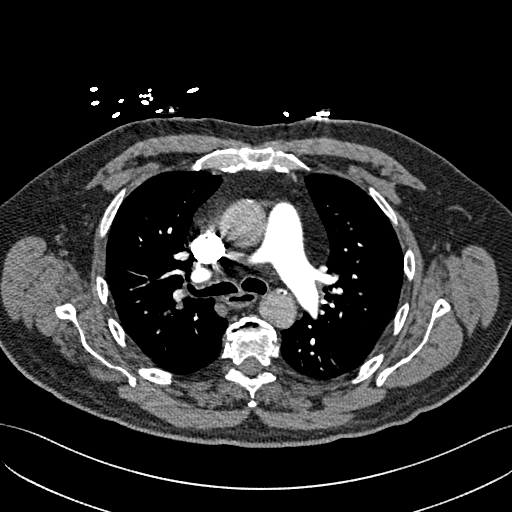
[im 169/254  lung]
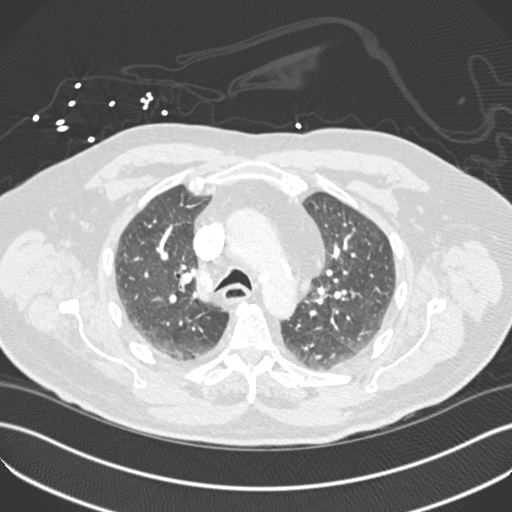
[im 183/254  soft-tissue]
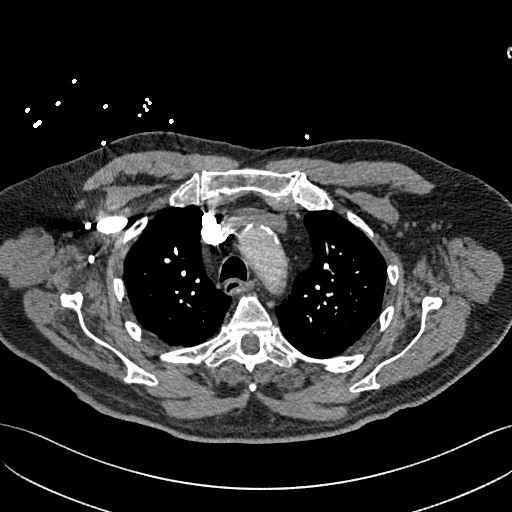
[im 197/254  lung]
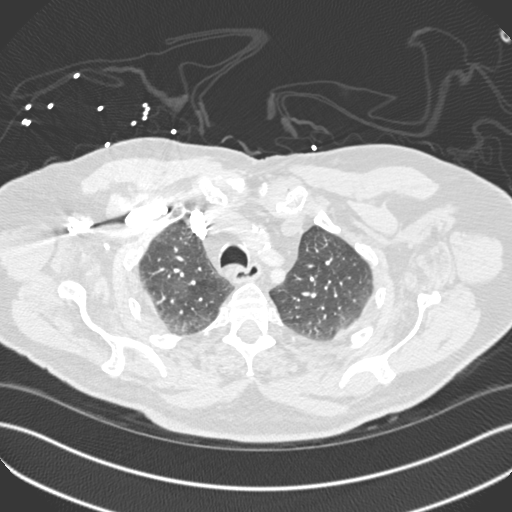
[im 211/254  soft-tissue]
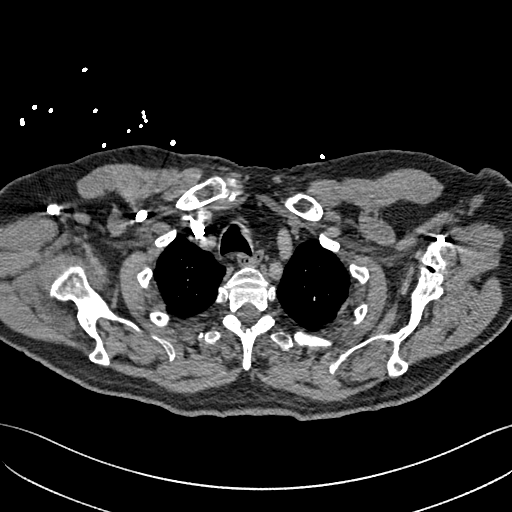
[im 225/254  lung]
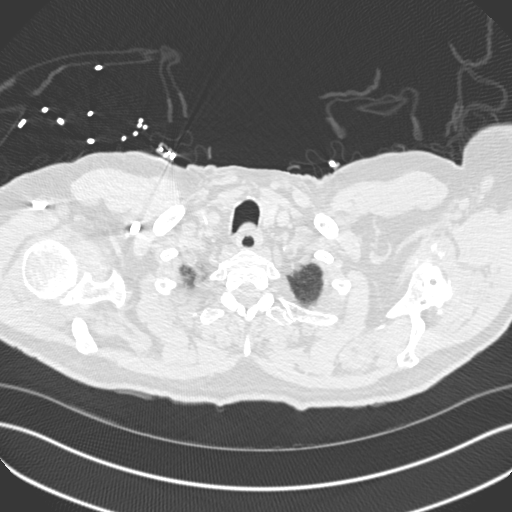
[im 239/254  soft-tissue]
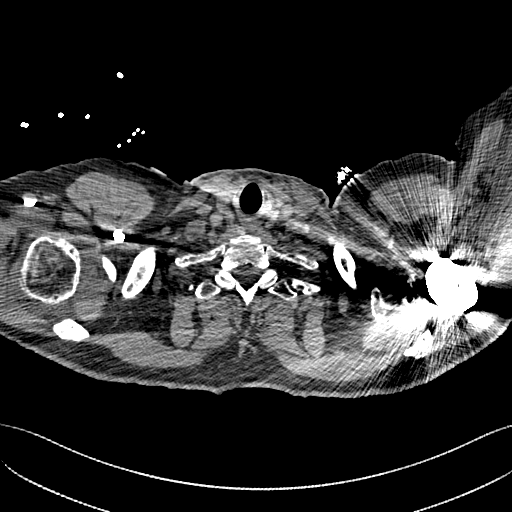

[Series 7: coronal mpr · coronal · 0.57mm/px · 3 of 138 slices shown]
[im 35/138  soft-tissue]
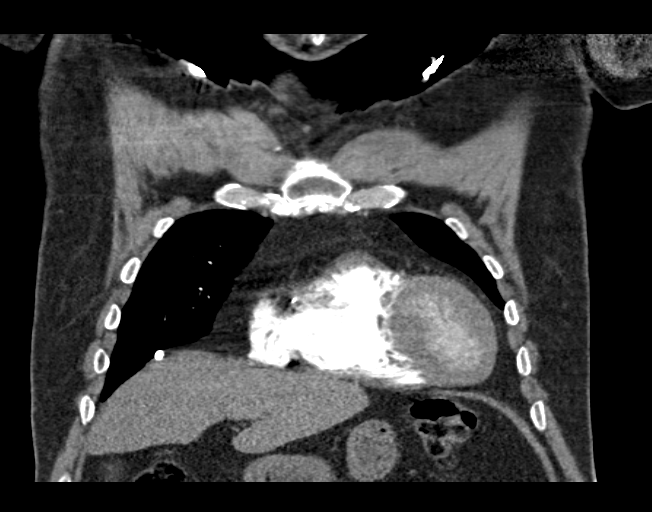
[im 69/138  soft-tissue]
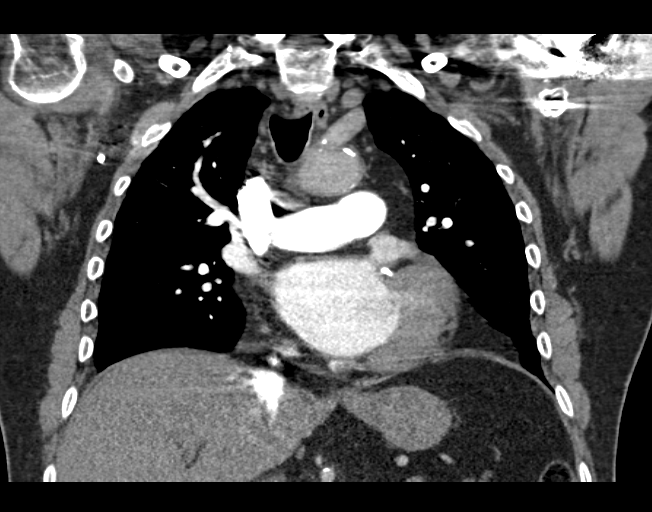
[im 103/138  soft-tissue]
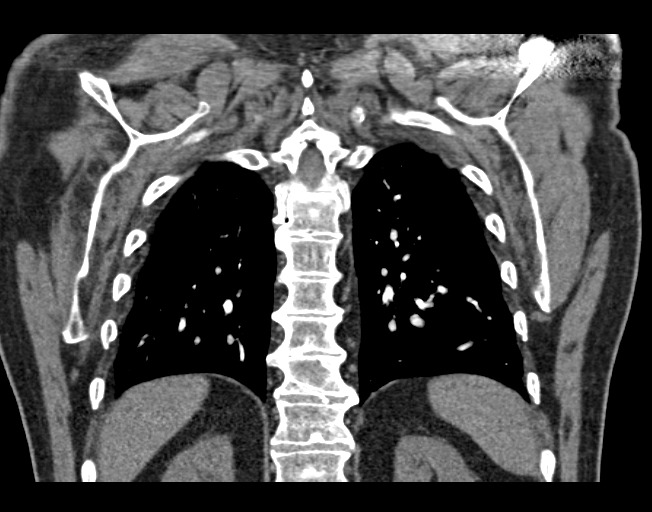

[19 of 46 positions shown; findings below may reference images not displayed]

FINDINGS: No pneumothorax or pleural effusion is noted. No acute pulmonary
disease is noted. There is no evidence of pulmonary embolus. There
is no evidence of thoracic aortic dissection or aneurysm. Great
vessels are widely patent. Coronary artery calcifications are noted.
Visualized portion of upper abdomen appears normal. Status post left
shoulder arthroplasty.

Review of the MIP images confirms the above findings.
IMPRESSION: No evidence of pulmonary embolus. No acute abnormality seen within
the chest.

## 2016-05-05 ENCOUNTER — Encounter: Payer: Self-pay | Admitting: Vascular Surgery

## 2016-05-05 ENCOUNTER — Ambulatory Visit (INDEPENDENT_AMBULATORY_CARE_PROVIDER_SITE_OTHER): Payer: Medicare Other | Admitting: Vascular Surgery

## 2016-05-05 VITALS — BP 138/74 | HR 76 | Temp 99.0°F | Ht 66.0 in | Wt 175.0 lb

## 2016-05-05 DIAGNOSIS — I6522 Occlusion and stenosis of left carotid artery: Secondary | ICD-10-CM

## 2016-05-05 NOTE — Progress Notes (Signed)
Subjective:     Patient ID: Ryan [name], male   DOB: 1941-12-01, 75 y.o.   MRN: [id number]  HPI this 75 year old male returns 11 days post left carotid endarterectomy for severe asymptomatic left internal carotid stenosis. He has no specific complaints. He denies any lateralizing weakness, aphasia, amaurosis fugax, or syncope. He is swallowing well and has no hoarseness. He did have one episode of difficulty swallowing immediately postop with his breakfast the next morning but that has not recurred. He takes one aspirin per day.     Review of Systems     Objective:   Physical Exam BP 138/74 mmHg  Pulse 76  Temp(Src) 99 F (37.2 C) (Oral)  Ht 5\' 6"  (1.676 m)  Wt 175 lb (79.379 kg)  BMI 28.26 kg/m2  SpO2 98%  Gen. well-developed well-nourished male no apparent distress alert and oriented 3 Left neck incision is healed nicely with minimal swelling. 3+ carotid pulse palpable with no audible bruit Neurologic exam normal     Assessment:     Doing well 11 days post left carotid endarterectomy for severe asymptomatic left internal carotid stenosis-remains asymptomatic    Plan:     Continue daily aspirin Return in 6 months for follow-up carotid duplex exam and see Dr. Servando Snare in follow-up Patient undergo cholecystectomy in the near future

## 2016-05-07 ENCOUNTER — Ambulatory Visit: Payer: Self-pay | Admitting: General Surgery

## 2016-05-07 DIAGNOSIS — K8020 Calculus of gallbladder without cholecystitis without obstruction: Secondary | ICD-10-CM | POA: Diagnosis not present

## 2016-05-24 ENCOUNTER — Other Ambulatory Visit: Payer: Self-pay | Admitting: Cardiovascular Disease

## 2016-05-27 ENCOUNTER — Telehealth: Payer: Self-pay | Admitting: *Deleted

## 2016-05-27 MED ORDER — NITROGLYCERIN 0.4 MG SL SUBL: 0.4000 mg | SUBLINGUAL_TABLET | SUBLINGUAL | Status: DC | PRN

## 2016-05-27 NOTE — Telephone Encounter (Signed)
Rx for NTG sent to patient's pharmacy.

## 2016-06-01 ENCOUNTER — Telehealth: Payer: Self-pay

## 2016-06-01 NOTE — Telephone Encounter (Signed)
Phone call from pt.  Reported he still has difficulty with swallowing solid food.  Reported he has to cut his food into small pieces, and then rinse it down with water or Coke.  Reported he tolerates swallowing mashed potatoes, applesauce, and scrambled eggs.  Reported he has to eat very slow, and then wash the food down.  Stated he is able to drink water without any problems.  Stated "it is a little better over the past 2-3 weeks, than it was.  Also reported he has continued numbness in the left side of the neck.  Questioned if this will continue to improve.  Advised the numbness in the neck should improve in time.  Will report the difficulty swallowing to Dr. Kellie Simmering, and return call to pt.  Verb. Understanding/ agreed.

## 2016-06-01 NOTE — Telephone Encounter (Signed)
Reported pt's symptoms to Dr. Kellie Simmering.  Advised to reassure pt. It may take more time for the swallowing difficulty to resolve.  Advised to recommend pt. to continue to be careful in cutting food into small pieces, and to eat very slowly, and give it more time for symptoms to improve.  Phone call returned to pt.  Advised of the recommendations by Dr. Kellie Simmering.  Verb. Understanding.  Stated he will call back and report, if symptoms don't improve.

## 2016-06-08 ENCOUNTER — Other Ambulatory Visit (INDEPENDENT_AMBULATORY_CARE_PROVIDER_SITE_OTHER): Payer: Medicare Other | Admitting: *Deleted

## 2016-06-08 DIAGNOSIS — I2510 Atherosclerotic heart disease of native coronary artery without angina pectoris: Secondary | ICD-10-CM | POA: Diagnosis not present

## 2016-06-08 DIAGNOSIS — I2583 Coronary atherosclerosis due to lipid rich plaque: Principal | ICD-10-CM

## 2016-06-08 LAB — LIPID PANEL
CHOL/HDL RATIO: 3.8 ratio (ref <=5.0)
CHOLESTEROL: 152 mg/dL (ref 125–200)
HDL: 40 mg/dL (ref >=40)
LDL Cholesterol: 97 mg/dL (ref <130)
Triglycerides: 75 mg/dL (ref <150)
VLDL: 15 mg/dL (ref <30)

## 2016-06-08 LAB — HEPATIC FUNCTION PANEL
ALBUMIN: 3.9 g/dL (ref 3.6–5.1)
ALT: 9 U/L (ref 9–46)
AST: 16 U/L (ref 10–35)
Alkaline Phosphatase: 41 U/L (ref 40–115)
BILIRUBIN TOTAL: 0.5 mg/dL (ref 0.2–1.2)
Bilirubin, Direct: 0.1 mg/dL (ref <=0.2)
Indirect Bilirubin: 0.4 mg/dL (ref 0.2–1.2)
Total Protein: 6.6 g/dL (ref 6.1–8.1)

## 2016-06-11 ENCOUNTER — Telehealth: Payer: Self-pay | Admitting: Pharmacist

## 2016-06-11 NOTE — Telephone Encounter (Signed)
Called pt to discuss lipid panel results from last week. LDL 97 with goal < 70 for secondary prevention. He is currently taking 1/2 of a Crestor 5mg  tablet 3x a week. He has been tolerating this dose well. He will plan to increase to a full tablet 3 days a week and will self titrate up his dose as tolerated. He may also potentially qualify for the neurocog trial looking at long term use of Praluent. He may be interested in this, but would like to increase his Crestor dose first. He will call clinic in a few weeks to f/u with tolerability.

## 2016-06-15 ENCOUNTER — Other Ambulatory Visit: Payer: Medicare Other

## 2016-06-17 ENCOUNTER — Ambulatory Visit: Payer: Medicare Other | Admitting: Cardiovascular Disease

## 2016-06-18 ENCOUNTER — Other Ambulatory Visit: Payer: Medicare Other

## 2016-06-18 ENCOUNTER — Ambulatory Visit: Payer: Medicare Other | Admitting: Cardiovascular Disease

## 2016-06-24 ENCOUNTER — Ambulatory Visit (INDEPENDENT_AMBULATORY_CARE_PROVIDER_SITE_OTHER): Payer: Medicare Other | Admitting: Cardiovascular Disease

## 2016-06-24 ENCOUNTER — Encounter: Payer: Self-pay | Admitting: Cardiovascular Disease

## 2016-06-24 VITALS — BP 128/68 | HR 72 | Ht 66.0 in

## 2016-06-24 DIAGNOSIS — I2510 Atherosclerotic heart disease of native coronary artery without angina pectoris: Secondary | ICD-10-CM

## 2016-06-24 DIAGNOSIS — I70211 Atherosclerosis of native arteries of extremities with intermittent claudication, right leg: Secondary | ICD-10-CM

## 2016-06-24 DIAGNOSIS — I10 Essential (primary) hypertension: Secondary | ICD-10-CM

## 2016-06-24 DIAGNOSIS — I2583 Coronary atherosclerosis due to lipid rich plaque: Secondary | ICD-10-CM

## 2016-06-24 NOTE — Patient Instructions (Signed)
Medication Instructions:  Your physician recommends that you continue on your current medications as directed. Please refer to the Current Medication list given to you today.  Labwork: Your physician recommends that you return for a FASTING LIPID, LIVER and BMP in 3 MONTHS--nothing to eat or drink after midnight, lab opens at 7:30 AM  Testing/Procedures: No new orders.   Follow-Up: Your physician wants you to follow-up in: 6 MONTHS with Dr Acie Fredrickson.  You will receive a reminder letter in the mail two months in advance. If you don't receive a letter, please call our office to schedule the follow-up appointment.   Any Other Special Instructions Will Be Listed Below (If Applicable).     If you need a refill on your cardiac medications before your next appointment, please call your pharmacy.

## 2016-06-24 NOTE — Progress Notes (Signed)
Problem List  1. CAD - CABG  2. Essential Hypertension 3. Hyperlipidemia 4. Left carotid arterectomy    Ryan [name] is a 75 y.o. man  With a history of hyperlipidemia, coronary artery disease, hypothyroidism  He was admitted Mrch 31 with CP and dyspnea.   VQ was intermediate.   Was clinically thought to have a PE  So he was DC'd on NOAC. Was readmitted several days later with STEMI and cath revealed severe CAD. CT angio on April 16 showed no evidence of pulmonary embolus.   Oct. 4, 2016:  Ryan [name] is doing well.  Occasional episodes of orthostatic hypotension Is eating some salty snacks. Has lots of hip pain with walking  Encouraged him to try a stationary bike or elliptical  Had peripheral arterial duplex scan ( ? At [facility]) that was reportedly normal.   March 17, 2016  Thought he had some symptoms of food poisoning - occurred after eating .  Also may have developed back issues related to taking the crestor also  He stopped the crestor and the symptoms have resolved.   June 24, 2016:  Ryan [name] is seen today .   Spent the weekend at [facility]. - has a house there.  No CP or dyspnea.      Objective: Vital signs in last 24 hours: Temp:  [97.5 F (36.4 C)-97.9 F (36.6 C)] 97.9 F (36.6 C) (04/14 0435) Pulse Rate:  [67-81] 74 (04/14 0435) Resp:  [18] 18 (04/14 0435) BP: (105-144)/(52-77) 123/68 mmHg (04/14 0435) SpO2:  [95 %-97 %] 96 % (04/14 0435)    Medications Current Outpatient Prescriptions  Medication Sig Dispense Refill  . amLODipine (NORVASC) 5 MG tablet Take 2.5 mg by mouth daily.     Marland Kitchen aspirin 81 MG chewable tablet Chew 1 tablet (81 mg total) by mouth daily.    . diclofenac sodium (VOLTAREN) 1 % GEL Apply 1 application topically 3 (three) times daily as needed.    . docusate sodium (COLACE) 100 MG capsule Take 100 mg by mouth daily.    Marland Kitchen ezetimibe (ZETIA) 10 MG tablet Take 10 mg by mouth daily.    . nitroGLYCERIN (NITROSTAT) 0.4 MG SL tablet Place 1  tablet (0.4 mg total) under the tongue every 5 (five) minutes as needed for chest pain. 25 tablet 6  . pravastatin (PRAVACHOL) 20 MG tablet Take 20 mg by mouth daily.    . silodosin (RAPAFLO) 8 MG CAPS capsule Take 8 mg by mouth at bedtime.     . valsartan (DIOVAN) 160 MG tablet Take 160 mg by mouth daily.     No current facility-administered medications for this visit.    PE: BP 128/68 mmHg  Pulse 72  Ht 5\' 6"  (1.676 m) HEENT:   No JVD, soft left carotid bruit Lungs: clear  Cor:  RR Abd:  + BS ,  Ext , no clubbing cyanosis , no edema,  Skin , normal Psych:  Normal ,   Neuro: CN intact  ECG : Ordered by by :   NSR at 80 with frequent PACS.     Lab Results:  No results for input(s): WBC, HGB, HCT, PLT in the last 72 hours. BMET No results for input(s): NA, K, CL, CO2, GLUCOSE, BUN, CREATININE, CALCIUM in the last 72 hours.  Assessment/Plan    1. CAD: severe multivessel disease. CABG    he's doing well. He's not had any further episodes of chest discomfort.  Remains very active - no angina  2. Hyperlipidemia - LDL = 97 on Crestor 5 mg M,W,F and Zetia 10 mg a day  Will try  Crestor 10 mg on M,W,F. , continue Zetia   3. Hypothyroidism:   4.  Essential hypertension-  Blood pressure is well controlled at this point .    5.  Cholecystitis:   He will need to have gall bladder surgery soon. He is at low risk for his cholecystectomy.     Thayer Headings, Brooke Bonito., MD, [facility] 06/24/2016, 8:28 AM [address],  [place] Pager [phone]

## 2016-07-09 ENCOUNTER — Other Ambulatory Visit: Payer: Self-pay | Admitting: Vascular Surgery

## 2016-07-09 DIAGNOSIS — I6523 Occlusion and stenosis of bilateral carotid arteries: Secondary | ICD-10-CM

## 2016-07-20 ENCOUNTER — Encounter (HOSPITAL_COMMUNITY)
Admission: RE | Admit: 2016-07-20 | Discharge: 2016-07-20 | Disposition: A | Discharge status: Home / Self Care | Payer: Medicare Other | Source: Ambulatory Visit | Attending: General Surgery | Admitting: General Surgery

## 2016-07-20 ENCOUNTER — Encounter (HOSPITAL_COMMUNITY): Payer: Self-pay

## 2016-07-20 DIAGNOSIS — I739 Peripheral vascular disease, unspecified: Secondary | ICD-10-CM | POA: Diagnosis not present

## 2016-07-20 DIAGNOSIS — I498 Other specified cardiac arrhythmias: Secondary | ICD-10-CM | POA: Diagnosis not present

## 2016-07-20 DIAGNOSIS — K811 Chronic cholecystitis: Secondary | ICD-10-CM | POA: Diagnosis not present

## 2016-07-20 DIAGNOSIS — Z87891 Personal history of nicotine dependence: Secondary | ICD-10-CM | POA: Diagnosis not present

## 2016-07-20 DIAGNOSIS — Z86711 Personal history of pulmonary embolism: Secondary | ICD-10-CM | POA: Diagnosis not present

## 2016-07-20 DIAGNOSIS — Z01812 Encounter for preprocedural laboratory examination: Secondary | ICD-10-CM | POA: Diagnosis not present

## 2016-07-20 DIAGNOSIS — Z96642 Presence of left artificial hip joint: Secondary | ICD-10-CM | POA: Insufficient documentation

## 2016-07-20 DIAGNOSIS — N289 Disorder of kidney and ureter, unspecified: Secondary | ICD-10-CM | POA: Diagnosis not present

## 2016-07-20 DIAGNOSIS — I110 Hypertensive heart disease with heart failure: Secondary | ICD-10-CM | POA: Diagnosis not present

## 2016-07-20 DIAGNOSIS — K219 Gastro-esophageal reflux disease without esophagitis: Secondary | ICD-10-CM | POA: Diagnosis not present

## 2016-07-20 DIAGNOSIS — I509 Heart failure, unspecified: Secondary | ICD-10-CM | POA: Diagnosis not present

## 2016-07-20 DIAGNOSIS — I493 Ventricular premature depolarization: Secondary | ICD-10-CM | POA: Insufficient documentation

## 2016-07-20 DIAGNOSIS — Z951 Presence of aortocoronary bypass graft: Secondary | ICD-10-CM | POA: Insufficient documentation

## 2016-07-20 DIAGNOSIS — E039 Hypothyroidism, unspecified: Secondary | ICD-10-CM | POA: Diagnosis not present

## 2016-07-20 DIAGNOSIS — I2510 Atherosclerotic heart disease of native coronary artery without angina pectoris: Secondary | ICD-10-CM | POA: Diagnosis not present

## 2016-07-20 DIAGNOSIS — Z79899 Other long term (current) drug therapy: Secondary | ICD-10-CM | POA: Insufficient documentation

## 2016-07-20 DIAGNOSIS — Z01818 Encounter for other preprocedural examination: Secondary | ICD-10-CM | POA: Diagnosis not present

## 2016-07-20 DIAGNOSIS — Z86718 Personal history of other venous thrombosis and embolism: Secondary | ICD-10-CM | POA: Insufficient documentation

## 2016-07-20 DIAGNOSIS — E785 Hyperlipidemia, unspecified: Secondary | ICD-10-CM | POA: Insufficient documentation

## 2016-07-20 HISTORY — DX: Hypothyroidism, unspecified: E03.9

## 2016-07-20 LAB — BASIC METABOLIC PANEL
Anion gap: 7 (ref 5–15)
BUN: 13 mg/dL (ref 6–20)
CHLORIDE: 110 mmol/L (ref 101–111)
CO2: 22 mmol/L (ref 22–32)
CREATININE: 1 mg/dL (ref 0.61–1.24)
Calcium: 9.1 mg/dL (ref 8.9–10.3)
GFR calc Af Amer: >60 mL/min (ref >60)
GFR calc non Af Amer: >60 mL/min (ref >60)
Glucose, Bld: 104 mg/dL — ABNORMAL HIGH (ref 65–99)
Potassium: 4 mmol/L (ref 3.5–5.1)
Sodium: 139 mmol/L (ref 135–145)

## 2016-07-20 LAB — CBC
HCT: 43.6 % (ref 39.0–52.0)
Hemoglobin: 14.3 g/dL (ref 13.0–17.0)
MCH: 31.2 pg (ref 26.0–34.0)
MCHC: 32.8 g/dL (ref 30.0–36.0)
MCV: 95.2 fL (ref 78.0–100.0)
PLATELETS: 252 10*3/uL (ref 150–400)
RBC: 4.58 MIL/uL (ref 4.22–5.81)
RDW: 13.1 % (ref 11.5–15.5)
WBC: 8.5 10*3/uL (ref 4.0–10.5)

## 2016-07-20 NOTE — Progress Notes (Signed)
PCP - Marton Redwood  Cardiologist - Dr. Cathie Olden - needs clearance  Chest x-ray -  Not needed EKG - 07/20/16 Stress Test - 03/05/15 ECHO - 03/2015 no results in epic - [facility]  Will send to anesthesia for review    Patient denies shortness of breath, fever, cough and chest pain at PAT appointment

## 2016-07-20 NOTE — Pre-Procedure Instructions (Signed)
Ryan [name]  07/20/2016      [facility] [address] [place] [address] Phone: [phone] Fax: [phone]    Your procedure is scheduled on August 7  Report to [facility] at 1045 A.M.  Call this number if you have problems the morning of surgery:  [phone]   Remember:  Do not eat food or drink liquids after midnight.   Take these medicines the morning of surgery with A SIP OF WATER amlodipine (norvasc), nitro if needed,   7 days prior to surgery STOP taking any Aspirin, Aleve, Naproxen, Ibuprofen, Motrin, Advil, Goody's, BC's, all herbal medications, fish oil, and all vitamins    Do not wear jewelry.  Do not wear lotions, powders, or colonge.  You may NOT wear deoderant.  Men may shave face and neck.  Do not bring valuables to the hospital.  [facility] is not responsible for any belongings or valuables.  Contacts, dentures or bridgework may not be worn into surgery.  Leave your suitcase in the car.  After surgery it may be brought to your room.  For patients admitted to the hospital, discharge time will be determined by your treatment team.  Patients discharged the day of surgery will not be allowed to drive home.    Special instructions:   [facility]- Preparing For Surgery  Before surgery, you can play an important role. Because skin is not sterile, your skin needs to be as free of germs as possible. You can reduce the number of germs on your skin by washing with CHG (chlorahexidine gluconate) Soap before surgery.  CHG is an antiseptic cleaner which kills germs and bonds with the skin to continue killing germs even after washing.  Please do not use if you have an allergy to CHG or antibacterial soaps. If your skin becomes reddened/irritated stop using the CHG.  Do not shave (including legs and underarms) for at least 48 hours prior to first CHG shower. It  is OK to shave your face.  Please follow these instructions carefully.   1. Shower the NIGHT BEFORE SURGERY and the MORNING OF SURGERY with CHG.   2. If you chose to wash your hair, wash your hair first as usual with your normal shampoo.  3. After you shampoo, rinse your hair and body thoroughly to remove the shampoo.  4. Use CHG as you would any other liquid soap. You can apply CHG directly to the skin and wash gently with a scrungie or a clean washcloth.   5. Apply the CHG Soap to your body ONLY FROM THE NECK DOWN.  Do not use on open wounds or open sores. Avoid contact with your eyes, ears, mouth and genitals (private parts). Wash genitals (private parts) with your normal soap.  6. Wash thoroughly, paying special attention to the area where your surgery will be performed.  7. Thoroughly rinse your body with warm water from the neck down.  8. DO NOT shower/wash with your normal soap after using and rinsing off the CHG Soap.  9. Pat yourself dry with a CLEAN TOWEL.   10. Wear CLEAN PAJAMAS   11. Place CLEAN SHEETS on your bed the night of your first shower and DO NOT SLEEP WITH PETS.    Day of Surgery: Do not apply any deodorants/lotions. Please wear clean clothes to the hospital/surgery center.      Please read over  the following fact sheets that you were given. Pain Booklet, Coughing and Deep Breathing and Surgical Site Infection Prevention

## 2016-07-22 ENCOUNTER — Encounter (HOSPITAL_COMMUNITY): Payer: Self-pay

## 2016-07-22 NOTE — Progress Notes (Signed)
Anesthesia Chart Review:  Pt is a 75 year old male scheduled for laparoscopic cholecystectomy on 07/27/2016 with Gurney Maxin, MD.   Cardiologist is Mertie Moores, MD who has cleared pt for surgery at low risk. PCP is Marton Redwood, MD  PMH includes:  CAD (s/p CABG 04/05/15), CHF, HTN, PE, DVT, carotid artery occlusion (s/p L CEA 04/24/16), PVD, hyperlipidemia, hypothyroidism, renal insufficiency, dysrhythmia (not specified), GERD. Former smoker. BMI 28. S/p L THA 01/09/14. S/p lumbar laminectomy 01/06/13 and 02/12/12.   Medications include: amlodipine, crestor  Preoperative labs reviewed.    Chest x-ray 05/06/16 reviewed.  1. Interim near complete clearing of left lower lobe infiltrate. Small left pleural effusion. 2. Prior CABG.  Cardiomegaly.  EKG 07/20/16: Sinus rhythm with marked sinus arrhythmia  Carotid duplex 04/14/16:  1. <40% R ICA stenosis 2. 80-99% L ICA stenosis  -> pt had L CEA 04/24/16  If no changes, I anticipate pt can proceed with surgery as scheduled.   Willeen Cass, FNP-BC [facility]/Anesthesiology Phone: [phone] 07/22/2016 4:55 PM

## 2016-07-26 MED ORDER — CEFOTETAN DISODIUM-DEXTROSE 2-2.08 GM-% IV SOLR
2.0000 g | INTRAVENOUS | Status: AC
  Administered 2016-07-27: 2 g via INTRAVENOUS
  Filled 2016-07-26: qty 50

## 2016-07-27 ENCOUNTER — Ambulatory Visit (HOSPITAL_COMMUNITY): Payer: Medicare Other | Admitting: Certified Registered"

## 2016-07-27 ENCOUNTER — Ambulatory Visit (HOSPITAL_COMMUNITY): Payer: Medicare Other | Admitting: Emergency Medicine

## 2016-07-27 ENCOUNTER — Encounter (HOSPITAL_COMMUNITY): Payer: Self-pay | Admitting: Urology

## 2016-07-27 ENCOUNTER — Encounter (HOSPITAL_COMMUNITY)
Admission: RE | Disposition: A | Discharge status: Home / Self Care | Payer: Self-pay | Source: Ambulatory Visit | Attending: General Surgery

## 2016-07-27 ENCOUNTER — Observation Stay (HOSPITAL_COMMUNITY)
Admission: RE | Admit: 2016-07-27 | Discharge: 2016-07-28 | Disposition: A | Discharge status: Home / Self Care | Payer: Medicare Other | Source: Ambulatory Visit | Attending: General Surgery | Admitting: General Surgery

## 2016-07-27 DIAGNOSIS — K811 Chronic cholecystitis: Principal | ICD-10-CM | POA: Diagnosis present

## 2016-07-27 DIAGNOSIS — I252 Old myocardial infarction: Secondary | ICD-10-CM | POA: Insufficient documentation

## 2016-07-27 DIAGNOSIS — E7800 Pure hypercholesterolemia, unspecified: Secondary | ICD-10-CM | POA: Diagnosis not present

## 2016-07-27 DIAGNOSIS — Z86718 Personal history of other venous thrombosis and embolism: Secondary | ICD-10-CM | POA: Insufficient documentation

## 2016-07-27 DIAGNOSIS — I2510 Atherosclerotic heart disease of native coronary artery without angina pectoris: Secondary | ICD-10-CM | POA: Insufficient documentation

## 2016-07-27 DIAGNOSIS — Z86711 Personal history of pulmonary embolism: Secondary | ICD-10-CM | POA: Insufficient documentation

## 2016-07-27 DIAGNOSIS — M1990 Unspecified osteoarthritis, unspecified site: Secondary | ICD-10-CM | POA: Insufficient documentation

## 2016-07-27 DIAGNOSIS — I509 Heart failure, unspecified: Secondary | ICD-10-CM | POA: Insufficient documentation

## 2016-07-27 DIAGNOSIS — I739 Peripheral vascular disease, unspecified: Secondary | ICD-10-CM | POA: Insufficient documentation

## 2016-07-27 DIAGNOSIS — Z79899 Other long term (current) drug therapy: Secondary | ICD-10-CM | POA: Insufficient documentation

## 2016-07-27 DIAGNOSIS — Z951 Presence of aortocoronary bypass graft: Secondary | ICD-10-CM | POA: Diagnosis not present

## 2016-07-27 DIAGNOSIS — Z7982 Long term (current) use of aspirin: Secondary | ICD-10-CM | POA: Diagnosis not present

## 2016-07-27 DIAGNOSIS — K8010 Calculus of gallbladder with chronic cholecystitis without obstruction: Secondary | ICD-10-CM | POA: Diagnosis present

## 2016-07-27 DIAGNOSIS — Z96642 Presence of left artificial hip joint: Secondary | ICD-10-CM | POA: Insufficient documentation

## 2016-07-27 DIAGNOSIS — E785 Hyperlipidemia, unspecified: Secondary | ICD-10-CM | POA: Diagnosis not present

## 2016-07-27 DIAGNOSIS — I110 Hypertensive heart disease with heart failure: Secondary | ICD-10-CM | POA: Diagnosis not present

## 2016-07-27 DIAGNOSIS — M549 Dorsalgia, unspecified: Secondary | ICD-10-CM | POA: Diagnosis not present

## 2016-07-27 HISTORY — PX: CHOLECYSTECTOMY: SHX55

## 2016-07-27 LAB — CBC
HCT: 43.4 % (ref 39.0–52.0)
Hemoglobin: 14.3 g/dL (ref 13.0–17.0)
MCH: 31.1 pg (ref 26.0–34.0)
MCHC: 32.9 g/dL (ref 30.0–36.0)
MCV: 94.3 fL (ref 78.0–100.0)
PLATELETS: 238 10*3/uL (ref 150–400)
RBC: 4.6 MIL/uL (ref 4.22–5.81)
RDW: 13.4 % (ref 11.5–15.5)
WBC: 15.7 10*3/uL — ABNORMAL HIGH (ref 4.0–10.5)

## 2016-07-27 LAB — CREATININE, SERUM
Creatinine, Ser: 1.02 mg/dL (ref 0.61–1.24)
GFR calc Af Amer: >60 mL/min (ref >60)
GFR calc non Af Amer: >60 mL/min (ref >60)

## 2016-07-27 SURGERY — LAPAROSCOPIC CHOLECYSTECTOMY
Anesthesia: General | Site: Abdomen

## 2016-07-27 MED ORDER — ONDANSETRON HCL 4 MG/2ML IJ SOLN
INTRAMUSCULAR | Status: AC
  Filled 2016-07-27: qty 2

## 2016-07-27 MED ORDER — FENTANYL CITRATE (PF) 250 MCG/5ML IJ SOLN
INTRAMUSCULAR | Status: AC
  Filled 2016-07-27: qty 5

## 2016-07-27 MED ORDER — BUPIVACAINE-EPINEPHRINE (PF) 0.25% -1:200000 IJ SOLN
INTRAMUSCULAR | Status: AC
  Filled 2016-07-27: qty 30

## 2016-07-27 MED ORDER — FENTANYL CITRATE (PF) 100 MCG/2ML IJ SOLN
25.0000 ug | INTRAMUSCULAR | Status: DC | PRN
  Administered 2016-07-27 (×3): 50 ug via INTRAVENOUS

## 2016-07-27 MED ORDER — GLYCOPYRROLATE 0.2 MG/ML IJ SOLN
INTRAMUSCULAR | Status: DC | PRN
Start: 2016-07-27 — End: 2016-07-27
  Administered 2016-07-27: 0.4 mg via INTRAVENOUS

## 2016-07-27 MED ORDER — ROSUVASTATIN CALCIUM 5 MG PO TABS
5.0000 mg | ORAL_TABLET | ORAL | Status: DC
  Administered 2016-07-27: 5 mg via ORAL
  Filled 2016-07-27: qty 1

## 2016-07-27 MED ORDER — SENNOSIDES-DOCUSATE SODIUM 8.6-50 MG PO TABS: 1.0000 | ORAL_TABLET | Freq: Every evening | ORAL | Status: DC | PRN

## 2016-07-27 MED ORDER — ONDANSETRON HCL 4 MG/2ML IJ SOLN
4.0000 mg | Freq: Four times a day (QID) | INTRAMUSCULAR | Status: DC | PRN
  Administered 2016-07-27: 4 mg via INTRAVENOUS
  Filled 2016-07-27: qty 2

## 2016-07-27 MED ORDER — LACTATED RINGERS IV SOLN
INTRAVENOUS | Status: DC
  Administered 2016-07-27 (×2): via INTRAVENOUS

## 2016-07-27 MED ORDER — HEPARIN SODIUM (PORCINE) 5000 UNIT/ML IJ SOLN
5000.0000 [IU] | Freq: Once | INTRAMUSCULAR | Status: AC
  Administered 2016-07-27: 5000 [IU] via SUBCUTANEOUS

## 2016-07-27 MED ORDER — SODIUM CHLORIDE 0.9 % IV SOLN
INTRAVENOUS | Status: DC
  Administered 2016-07-27: 17:00:00 via INTRAVENOUS

## 2016-07-27 MED ORDER — HEPARIN SODIUM (PORCINE) 5000 UNIT/ML IJ SOLN
5000.0000 [IU] | Freq: Three times a day (TID) | INTRAMUSCULAR | Status: DC
  Administered 2016-07-27 – 2016-07-28 (×2): 5000 [IU] via SUBCUTANEOUS
  Filled 2016-07-27 (×2): qty 1

## 2016-07-27 MED ORDER — AMLODIPINE BESYLATE 5 MG PO TABS
5.0000 mg | ORAL_TABLET | Freq: Every day | ORAL | Status: DC
  Administered 2016-07-27 – 2016-07-28 (×2): 5 mg via ORAL
  Filled 2016-07-27 (×3): qty 1

## 2016-07-27 MED ORDER — NEOSTIGMINE METHYLSULFATE 10 MG/10ML IV SOLN
INTRAVENOUS | Status: DC | PRN
  Administered 2016-07-27: 3 mg via INTRAVENOUS

## 2016-07-27 MED ORDER — PROPOFOL 10 MG/ML IV BOLUS
INTRAVENOUS | Status: DC | PRN
  Administered 2016-07-27: 100 mg via INTRAVENOUS

## 2016-07-27 MED ORDER — FENTANYL CITRATE (PF) 100 MCG/2ML IJ SOLN
INTRAMUSCULAR | Status: DC | PRN
  Administered 2016-07-27: 100 ug via INTRAVENOUS
  Administered 2016-07-27 (×3): 50 ug via INTRAVENOUS

## 2016-07-27 MED ORDER — PROPOFOL 10 MG/ML IV BOLUS
INTRAVENOUS | Status: AC
  Filled 2016-07-27: qty 20

## 2016-07-27 MED ORDER — ROCURONIUM BROMIDE 10 MG/ML (PF) SYRINGE
PREFILLED_SYRINGE | INTRAVENOUS | Status: AC
  Filled 2016-07-27: qty 10

## 2016-07-27 MED ORDER — FENTANYL CITRATE (PF) 100 MCG/2ML IJ SOLN
INTRAMUSCULAR | Status: AC
  Administered 2016-07-27: 50 ug via INTRAVENOUS
  Filled 2016-07-27: qty 2

## 2016-07-27 MED ORDER — SODIUM CHLORIDE 0.9 % IR SOLN
Status: DC | PRN
  Administered 2016-07-27 (×2): 1

## 2016-07-27 MED ORDER — HEPARIN SODIUM (PORCINE) 5000 UNIT/ML IJ SOLN
INTRAMUSCULAR | Status: AC
  Administered 2016-07-27: 5000 [IU] via SUBCUTANEOUS
  Filled 2016-07-27: qty 1

## 2016-07-27 MED ORDER — LIDOCAINE 2% (20 MG/ML) 5 ML SYRINGE
INTRAMUSCULAR | Status: AC
  Filled 2016-07-27: qty 5

## 2016-07-27 MED ORDER — ONDANSETRON HCL 4 MG/2ML IJ SOLN: 4.0000 mg | Freq: Once | INTRAMUSCULAR | Status: DC | PRN

## 2016-07-27 MED ORDER — ONDANSETRON 4 MG PO TBDP: 4.0000 mg | ORAL_TABLET | Freq: Four times a day (QID) | ORAL | Status: DC | PRN

## 2016-07-27 MED ORDER — FENTANYL CITRATE (PF) 100 MCG/2ML IJ SOLN
INTRAMUSCULAR | Status: AC
  Filled 2016-07-27: qty 2

## 2016-07-27 MED ORDER — PHENYLEPHRINE 40 MCG/ML (10ML) SYRINGE FOR IV PUSH (FOR BLOOD PRESSURE SUPPORT)
PREFILLED_SYRINGE | INTRAVENOUS | Status: AC
  Filled 2016-07-27: qty 10

## 2016-07-27 MED ORDER — HYDROMORPHONE HCL 1 MG/ML IJ SOLN
INTRAMUSCULAR | Status: DC | PRN
  Administered 2016-07-27 (×2): .25 mg via INTRAVENOUS
  Administered 2016-07-27: 0.5 mg via INTRAVENOUS

## 2016-07-27 MED ORDER — ONDANSETRON HCL 4 MG/2ML IJ SOLN
INTRAMUSCULAR | Status: DC | PRN
  Administered 2016-07-27: 4 mg via INTRAVENOUS

## 2016-07-27 MED ORDER — HYDROCODONE-ACETAMINOPHEN 5-325 MG PO TABS
ORAL_TABLET | ORAL | Status: AC
  Filled 2016-07-27: qty 1

## 2016-07-27 MED ORDER — ACETAMINOPHEN 325 MG PO TABS: 650.0000 mg | ORAL_TABLET | Freq: Four times a day (QID) | ORAL | Status: DC | PRN

## 2016-07-27 MED ORDER — HYDROMORPHONE HCL 1 MG/ML IJ SOLN
INTRAMUSCULAR | Status: AC
  Filled 2016-07-27: qty 1

## 2016-07-27 MED ORDER — MORPHINE SULFATE (PF) 2 MG/ML IV SOLN
2.0000 mg | INTRAVENOUS | Status: DC | PRN
  Administered 2016-07-27: 2 mg via INTRAVENOUS
  Filled 2016-07-27: qty 1

## 2016-07-27 MED ORDER — LIDOCAINE HCL (CARDIAC) 20 MG/ML IV SOLN
INTRAVENOUS | Status: DC | PRN
  Administered 2016-07-27: 100 mg via INTRAVENOUS

## 2016-07-27 MED ORDER — NITROGLYCERIN 0.4 MG SL SUBL: 0.4000 mg | SUBLINGUAL_TABLET | SUBLINGUAL | Status: DC | PRN

## 2016-07-27 MED ORDER — BUPIVACAINE-EPINEPHRINE 0.25% -1:200000 IJ SOLN
INTRAMUSCULAR | Status: DC | PRN
  Administered 2016-07-27: 20 mL

## 2016-07-27 MED ORDER — ROCURONIUM BROMIDE 100 MG/10ML IV SOLN
INTRAVENOUS | Status: DC | PRN
  Administered 2016-07-27: 50 mg via INTRAVENOUS

## 2016-07-27 MED ORDER — HYDROCODONE-ACETAMINOPHEN 5-325 MG PO TABS
1.0000 | ORAL_TABLET | ORAL | Status: DC | PRN
  Administered 2016-07-27: 1 via ORAL

## 2016-07-27 MED ORDER — 0.9 % SODIUM CHLORIDE (POUR BTL) OPTIME
TOPICAL | Status: DC | PRN
  Administered 2016-07-27: 1000 mL

## 2016-07-27 MED ORDER — PHENYLEPHRINE HCL 10 MG/ML IJ SOLN
INTRAMUSCULAR | Status: DC | PRN
  Administered 2016-07-27 (×3): 80 ug via INTRAVENOUS

## 2016-07-27 MED ORDER — SIMETHICONE 80 MG PO CHEW
40.0000 mg | CHEWABLE_TABLET | Freq: Four times a day (QID) | ORAL | Status: DC | PRN
  Administered 2016-07-27: 40 mg via ORAL
  Filled 2016-07-27: qty 1

## 2016-07-27 MED ORDER — ACETAMINOPHEN 650 MG RE SUPP: 650.0000 mg | Freq: Four times a day (QID) | RECTAL | Status: DC | PRN

## 2016-07-27 SURGICAL SUPPLY — 32 items
BAG SPEC RTRVL 10 TROC 200 (ENDOMECHANICALS) ×1
BLADE SURG ROTATE 9660 (MISCELLANEOUS) ×2 IMPLANT
CANISTER SUCTION 2500CC (MISCELLANEOUS) ×3 IMPLANT
CHLORAPREP W/TINT 26ML (MISCELLANEOUS) ×3 IMPLANT
CLIP LIGATING HEMO LOK XL GOLD (MISCELLANEOUS) ×3 IMPLANT
COVER SURGICAL LIGHT HANDLE (MISCELLANEOUS) ×3 IMPLANT
ELECT REM PT RETURN 9FT ADLT (ELECTROSURGICAL) ×3
ELECTRODE REM PT RTRN 9FT ADLT (ELECTROSURGICAL) ×1 IMPLANT
GLOVE BIOGEL PI IND STRL 7.0 (GLOVE) ×1 IMPLANT
GLOVE BIOGEL PI INDICATOR 7.0 (GLOVE) ×6
GLOVE SURG SS PI 7.0 STRL IVOR (GLOVE) ×7 IMPLANT
GOWN STRL REUS W/ TWL LRG LVL3 (GOWN DISPOSABLE) ×3 IMPLANT
GOWN STRL REUS W/TWL LRG LVL3 (GOWN DISPOSABLE) ×9
GRASPER SUT TROCAR 14GX15 (MISCELLANEOUS) ×3 IMPLANT
KIT BASIN OR (CUSTOM PROCEDURE TRAY) ×3 IMPLANT
KIT ROOM TURNOVER OR (KITS) ×3 IMPLANT
LIQUID BAND (GAUZE/BANDAGES/DRESSINGS) ×3 IMPLANT
NS IRRIG 1000ML POUR BTL (IV SOLUTION) ×3 IMPLANT
PAD ARMBOARD 7.5X6 YLW CONV (MISCELLANEOUS) ×3 IMPLANT
POUCH RETRIEVAL ECOSAC 10 (ENDOMECHANICALS) ×1 IMPLANT
POUCH RETRIEVAL ECOSAC 10MM (ENDOMECHANICALS) ×2
SCISSORS LAP 5X35 DISP (ENDOMECHANICALS) ×3 IMPLANT
SET IRRIG TUBING LAPAROSCOPIC (IRRIGATION / IRRIGATOR) ×3 IMPLANT
SLEEVE ENDOPATH XCEL 5M (ENDOMECHANICALS) ×6 IMPLANT
SPECIMEN JAR SMALL (MISCELLANEOUS) ×3 IMPLANT
SUT MNCRL AB 4-0 PS2 18 (SUTURE) ×3 IMPLANT
TOWEL OR 17X24 6PK STRL BLUE (TOWEL DISPOSABLE) ×3 IMPLANT
TOWEL OR 17X26 10 PK STRL BLUE (TOWEL DISPOSABLE) ×3 IMPLANT
TRAY LAPAROSCOPIC MC (CUSTOM PROCEDURE TRAY) ×3 IMPLANT
TROCAR XCEL 12X100 BLDLESS (ENDOMECHANICALS) ×3 IMPLANT
TROCAR XCEL NON-BLD 5MMX100MML (ENDOMECHANICALS) ×3 IMPLANT
TUBING INSUFFLATION (TUBING) ×3 IMPLANT

## 2016-07-27 NOTE — Anesthesia Preprocedure Evaluation (Signed)
Anesthesia Evaluation  Patient identified by MRN, date of birth, ID band Patient awake    Reviewed: Allergy & Precautions, NPO status , Patient's Chart, lab work & pertinent test results  History of Anesthesia Complications Negative for: history of anesthetic complications  Airway Mallampati: II  TM Distance: >3 FB Neck ROM: Full    Dental  (+) Upper Dentures, Dental Advisory Given   Pulmonary former smoker, PE   Pulmonary exam normal        Cardiovascular hypertension, Pt. on medications + CAD, + Past MI, + CABG, + Peripheral Vascular Disease, +CHF and + DVT  Normal cardiovascular exam Rhythm:Regular Rate:Normal     Neuro/Psych negative neurological ROS  negative psych ROS   GI/Hepatic Neg liver ROS, GERD  Controlled,  Endo/Other  Hypothyroidism   Renal/GU negative Renal ROS     Musculoskeletal  (+) Arthritis , Osteoarthritis,    Abdominal   Peds  Hematology negative hematology ROS (+)   Anesthesia Other Findings   Reproductive/Obstetrics negative OB ROS                             Anesthesia Physical  Anesthesia Plan  ASA: III  Anesthesia Plan: General   Post-op Pain Management:    Induction: Intravenous  Airway Management Planned: Oral ETT  Additional Equipment:   Intra-op Plan:   Post-operative Plan: Extubation in OR  Informed Consent: I have reviewed the patients History and Physical, chart, labs and discussed the procedure including the risks, benefits and alternatives for the proposed anesthesia with the patient or authorized representative who has indicated his/her understanding and acceptance.   Dental advisory given  Plan Discussed with: CRNA and Surgeon  Anesthesia Plan Comments: (Risks/benefits of general anesthesia discussed with patient including risk of damage to teeth, lips, gum, and tongue, nausea/vomiting, allergic reactions to medications, and the  possibility of heart attack, stroke and death.  All patient questions answered.  Patient wishes to proceed.)        Anesthesia Quick Evaluation

## 2016-07-27 NOTE — H&P (Signed)
The patient is a 75 year old male who presents for evaluation of gall stones. Patient has a four-month history of right upper quadrant pain occurring after meals radiating to the right shoulder. He has nausea and vomiting associated with this. Pain happens almost every day. He initially thought he has having a heart attack. He is tried to change his diet a little bit and does notice a difference but is still eating a fair amount of teeth and other high fat foods.  He has a recent history of left carotid stenosis underwent left carotid endarterectomy 3 weeks ago. He also has a long history of CAD with CABG performed in April 2016. He saw his cardiologist and March of this year who cleared him both for the endarterectomy and any abdominal surgery required for this pain. He does not have any current chest pain or dyspnea. He is able to play golf with a cart. He is able to ambulate around his house and mow the lawn.   Other Problems Elbert Ewings, CMA; 05/07/2016 8:44 AM) Arthritis Back Pain Bladder Problems Chest pain Congestive Heart Failure Heart murmur Hemorrhoids High blood pressure Hypercholesterolemia Kidney Stone Myocardial infarction Pulmonary Embolism / Blood Clot in Legs  Past Surgical History Elbert Ewings, CMA; 05/07/2016 8:44 AM) Bypass Surgery for Poor Blood Flow to Legs Cataract Surgery Left. Colon Polyp Removal - Colonoscopy Coronary Artery Bypass Graft Foot Surgery Bilateral. Hip Surgery Left. Shoulder Surgery Left. Spinal Surgery - Lower Back Spinal Surgery - Neck Vasectomy  Diagnostic Studies History Elbert Ewings, CMA; 05/07/2016 8:44 AM) Colonoscopy 1-5 years ago  Allergies Elbert Ewings, CMA; 05/07/2016 8:46 AM) ASA Arthritis Strength/Antacid *ANALGESICS - NonNarcotic* Codeine Sulfate *ANALGESICS - OPIOID* Ezetimibe *ANTIHYPERLIPIDEMICS* Lipitor *ANTIHYPERLIPIDEMICS* Vytorin *ANTIHYPERLIPIDEMICS*  Medication History Elbert Ewings, CMA; 05/07/2016 8:47 AM) AmLODIPine Besylate (5MG  Tablet, Oral) Active. Pravastatin Sodium (20MG  Tablet, Oral) Active. Rapaflo (8MG  Capsule, Oral) Active. Valsartan (160MG  Tablet, Oral) Active. Zetia (10MG  Tablet, Oral) Active. Dulcolax Stool Softener (100MG  Capsule, Oral) Active. Aspirin (81MG  Tablet, Oral) Active. Medications Reconciled  Social History Elbert Ewings, [place]; 05/07/2016 8:44 AM) Alcohol use Remotely quit alcohol use. Caffeine use Carbonated beverages, Coffee. No drug use Tobacco use Former smoker.  Family History Elbert Ewings, [place]; 05/07/2016 8:44 AM) Heart Disease Sister. Hypertension Sister.    Review of Systems Elbert Ewings CMA; 05/07/2016 8:44 AM) General Present- Appetite Loss, Night Sweats and Weight Loss. Not Present- Chills, Fatigue, Fever and Weight Gain. Skin Not Present- Change in Wart/Mole, Dryness, Hives, Jaundice, New Lesions, Non-Healing Wounds, Rash and Ulcer. HEENT Present- Hoarseness, Seasonal Allergies and Sore Throat. Not Present- Earache, Hearing Loss, Nose Bleed, Oral Ulcers, Ringing in the Ears, Sinus Pain, Visual Disturbances, Wears glasses/contact lenses and Yellow Eyes. Breast Not Present- Breast Mass, Breast Pain, Nipple Discharge and Skin Changes. Cardiovascular Present- Leg Cramps. Not Present- Chest Pain, Difficulty Breathing Lying Down, Palpitations, Rapid Heart Rate, Shortness of Breath and Swelling of Extremities. Gastrointestinal Present- Constipation, Excessive gas, Gets full quickly at meals, Indigestion and Nausea. Not Present- Abdominal Pain, Bloating, Bloody Stool, Change in Bowel Habits, Chronic diarrhea, Difficulty Swallowing, Hemorrhoids, Rectal Pain and Vomiting. Male Genitourinary Present- Nocturia. Not Present- Blood in Urine, Change in Urinary Stream, Frequency, Impotence, Painful Urination, Urgency and Urine Leakage. Musculoskeletal Present- Back Pain, Joint Pain and Muscle Pain. Not Present- Joint Stiffness,  Muscle Weakness and Swelling of Extremities. Neurological Present- Trouble walking and Weakness. Not Present- Decreased Memory, Fainting, Headaches, Numbness, Seizures, Tingling and Tremor. Psychiatric Not Present- Anxiety, Bipolar, Change in Sleep Pattern, Depression,  Fearful and Frequent crying. Endocrine Not Present- Cold Intolerance, Excessive Hunger, Hair Changes, Heat Intolerance, Hot flashes and New Diabetes. Hematology Present- Easy Bruising. Not Present- Excessive bleeding, Gland problems, HIV and Persistent Infections.  Vitals Elbert Ewings CMA; 05/07/2016 8:47 AM) 05/07/2016 8:47 AM Weight: 175 lb Height: 66in Body Surface Area: 1.89 m Body Mass Index: 28.25 kg/m  Temp.: 97.58F  Pulse: 97 (Regular)  BP: 130/72 (Sitting, Left Arm, Standard)       Physical Exam Geoffery Spruce MD; 05/07/2016 9:26 AM) General Mental Status-Alert. General Appearance-Cooperative. Orientation-Oriented X4. Posture-Normal posture.  Integumentary Global Assessment Normal Exam - Head/Face: no rashes, ulcers, lesions or evidence of photo damage. No palpable nodules or masses and Neck: no visible lesions or palpable masses.  Head and Neck Head-normocephalic, atraumatic with no lesions or palpable masses. Face Global Assessment - atraumatic. Thyroid Gland Characteristics - normal size and consistency.  Eye Eyeball - Bilateral-Extraocular movements intact. Sclera/Conjunctiva - Bilateral-No scleral icterus, No Discharge.  ENMT Nose and Sinuses Nose - no deformities observed, no swelling present.  Chest and Lung Exam Palpation Normal exam - Non-tender. Auscultation Breath sounds - Normal.  Cardiovascular Auscultation Rhythm - Regular. Heart Sounds - S1 WNL and S2 WNL. Carotid arteries - No Carotid bruit.  Abdomen Inspection Normal Exam - No Visible peristalsis, No Abnormal pulsations and No Paradoxical movements. Palpation/Percussion Normal exam - Soft,  No Rebound tenderness, No Rigidity (guarding), No hepatosplenomegaly and No Palpable abdominal masses. Tenderness - Right Upper Quadrant. Gallbladder - Negative Murphy's sign. Note: midline scar, no palpable hernia   Peripheral Vascular Upper Extremity Palpation - Pulses bilaterally normal. Lower Extremity Palpation - Edema - Bilateral - No edema.  Neurologic Neurologic evaluation reveals -normal sensation and normal coordination.  Neuropsychiatric Mental status exam performed with findings of-able to articulate well with normal speech/language, rate, volume and coherence and thought content normal with ability to perform basic computations and apply abstract reasoning.  Musculoskeletal Normal Exam - Bilateral-Upper Extremity Strength Normal and Lower Extremity Strength Normal.    Assessment & Plan Geoffery Spruce MD; 05/07/2016 9:31 AM) GALL BLADDER STONES (K80.20) Impression: 75 yo male with RUQ pain radiating to the right shoulder after meals consistent with gallstone disease. We discussed the etiology of gallstones and probably cause pain. We discussed exacerbating factors including fatty meals. We discussed the details of surgery for removal of the gallbladder including general anesthesia, 4 small incisions in the patient's abdomen, removal of the patient's gallbladder with the liver and common bile duct, and most likely outpatient procedure. We discussed risks of common bile duct injury, cystic duct stump leak, injury to liver, bleeding, infection, need for open procedure, and this cholecystectomy syndrome. The patient showed good understanding and wanted to proceed with laparoscopic cholecystectomy. We will plan for overnight stay due to his cardiac history. His cardiologist discussed this surgery in March, and did not think any additional preoperative cardiac workup is warranted. Current Plans The anatomy & physiology of hepatobiliary & pancreatic function was discussed. The  pathophysiology of gallbladder dysfunction was discussed. Natural history risks without surgery was discussed. I feel the risks of no intervention will lead to serious problems that outweigh the operative risks; therefore, I recommended cholecystectomy to remove the pathology. I explained laparoscopic techniques with possible need for an open approach. Probable cholangiogram to evaluate the bilary tract was explained as well.  Risks such as bleeding, infection, abscess, leak, injury to other organs, need for further treatment, heart attack, death, and other risks were discussed. I  noted a good likelihood this will help address the problem. Possibility that this will not correct all abdominal symptoms was explained. Goals of post-operative recovery were discussed as well. We will work to minimize complications. An educational handout further explaining the pathology and treatment options was given as well. Questions were answered. The patient expresses understanding & wishes to proceed with surgery.  Pt Education - Laparoscopic Cholecystectomy: gallbladder Pt Education - Pamphlet Given - Laparoscopic Gallbladder Surgery: discussed with patient and provided information. You are being scheduled for surgery - Our schedulers will call you.  You should hear from our office's scheduling department within 5 working days about the location, date, and time of surgery. We try to make accommodations for patient's preferences in scheduling surgery, but sometimes the OR schedule or the surgeon's schedule prevents [place] from making those accommodations.  If you have not heard from our office [phone]) in 5 working days, call the office and ask for your surgeon's nurse.  If you have other questions about your diagnosis, plan, or surgery, call the office and ask for your surgeon's nurse.

## 2016-07-27 NOTE — Transfer of Care (Signed)
Immediate Anesthesia Transfer of Care Note  Patient: Ryan [name]  Procedure(s) Performed: Procedure(s): LAPAROSCOPIC CHOLECYSTECTOMY (N/A)  Patient Location: PACU  Anesthesia Type:General  Level of Consciousness: awake, alert  and oriented  Airway & Oxygen Therapy: Patient Spontanous Breathing and Patient connected to nasal cannula oxygen  Post-op Assessment: Report given to RN and Post -op Vital signs reviewed and stable  Post vital signs: Reviewed and stable  Last Vitals:  Vitals:   07/27/16 0930 07/27/16 1325  BP: (!) 168/68   Pulse: 88   Resp: 20   Temp: 37.2 C (P) 36.3 C    Last Pain:  Vitals:   07/27/16 0930  TempSrc: Oral         Complications: No apparent anesthesia complications

## 2016-07-27 NOTE — Anesthesia Postprocedure Evaluation (Signed)
Anesthesia Post Note  Patient: Ryan [name]  Procedure(s) Performed: Procedure(s) (LRB): LAPAROSCOPIC CHOLECYSTECTOMY (N/A)  Patient location during evaluation: PACU Anesthesia Type: General Level of consciousness: awake and alert Pain management: pain level controlled Vital Signs Assessment: post-procedure vital signs reviewed and stable Respiratory status: spontaneous breathing, nonlabored ventilation and respiratory function stable Cardiovascular status: blood pressure returned to baseline and stable Postop Assessment: no signs of nausea or vomiting Anesthetic complications: no    Last Vitals:  Vitals:   07/27/16 1645 07/27/16 1848  BP: (!) 146/74 (!) 160/74  Pulse: 92 (!) 108  Resp: 12 14  Temp: 36.6 C 36.7 C    Last Pain:  Vitals:   07/27/16 1848  TempSrc: Oral  PainSc:                  Mykai Wendorf A

## 2016-07-27 NOTE — Op Note (Signed)
PATIENT:  Ryan [name]  75 y.o. male  PRE-OPERATIVE DIAGNOSIS:  Chronic cholecystitis  POST-OPERATIVE DIAGNOSIS:  Chronic cholecystitis  PROCEDURE:  Procedure(s): LAPAROSCOPIC CHOLECYSTECTOMY LYSIS OF ADHESIONS  SURGEON:  Surgeon(s): Arta Bruce Kinsinger, MD  ASSISTANT: none  ANESTHESIA:   local and general  Indications for procedure: [name] is a 75 y.o. male with symptoms of RUQ pain and Nausea consistent with gallbladder disease, Confirmed by Ultrasound.  Description of procedure: The patient was brought into the operative suite, placed supine. Anesthesia was administered with endotracheal tube. Patient was strapped in place and foot board was secured. All pressure points were offloaded by foam padding. The patient was prepped and draped in the usual sterile fashion.  A small incision was made in the left subcostal area. A 28mm trocar was inserted into the peritoneal cavity with optical entry. Pneumoperitoneum was applied with high flow low pressure. Camera was reinserted, the area had multiple areas of adhesions of the omentum to the abdominal wall. With blunt dissection the right abdomen was reached showing minimal scar tissue. 2 43mm trocars were placed in the RUQ.  1 59mm trocar was placed in the periumbilical area. The previous left subcostal incision was upsized to a 34mm trocar. All trocars sites were first anesthesized with 0.25% marcaine with epinephrine in the subcutaneous and preperitoneal layers.   Due to the large amount of scar tissue in the mid abdomen. Blunt and sharp dissection was used to free the abdominal wall of scar tissue except in one area in the post inferior aspect that had a densely adhered loop of bowel with no signs of dilation or obstruction. This portion of the case took 20 minutes. Next the patient was placed in reverse trendelenberg.   The gallbladder was retracted cephalad and lateral. The peritoneum was reflected off the infundibulum working  lateral to medial. The cystic duct and cystic artery were identified and further dissection revealed a critical view. The gallbladder was entered on dissection and multiple small black stones were collected and removed. The cystic duct and cystic artery were doubly clipped and ligated.   The gallbladder was removed off the liver bed with cautery. The Gallbladder was placed in a specimen bag. The gallbladder fossa was irrigated and hemostasis was applied with cautery. The gallbladder was removed via the 54mm trocar. The fascial defect was closed with interrupted 0 vicryl suture via laparoscopic trans-fascial suture passer. Pneumoperitoneum was removed, all trocar were removed. All incisions were closed with 4-0 monocryl subcuticular stitch. The patient woke from anesthesia and was brought to PACU in stable condition. All counts were correct  Findings: adhesions to the abdominal wall, multiple small stones, minimal inflammation of the gallbladder  Specimen: gallbladder  Blood loss: Total I/O In: 1000 [I.V.:1000] Out: 50 [Blood:50] ml  Local anesthesia: 20 ml 0.25% marcaine with epinephrine  Complications: none  PLAN OF CARE: Admit for overnight observation  PATIENT DISPOSITION:  PACU - hemodynamically stable.  Gurney Maxin, M.D. General, Bariatric, & Minimally Invasive Surgery [facility]

## 2016-07-27 NOTE — H&P (Signed)
Ryan [name] is an 75 y.o. male.   Chief Complaint: abdominal pain HPI: 75 yo male with 6 month history of RUQ and shoulder pain especially after eating. On work up he was found to have gallstones. He has multiple medical problems including CAD/CHF, history of PE now off xarelto.  Past Medical History:  Diagnosis Date  . Arthritis   . CAD (coronary artery disease)   . Carotid artery occlusion   . Cataract    Bil eyes/worse in left eye  . CHF (congestive heart failure) ([place])   . Chronic back pain   . DVT (deep venous thrombosis) ([place])   . Dysrhythmia   . Enlarged prostate    takes Rapaflo daily  . GERD (gastroesophageal reflux disease)    occasional  . History of colon polyps   . History of gout    has colchicine prn  . History of kidney stones   . Hyperlipidemia    takes Crestor daily  . Hypertension    takes Amlodipine daily  . Hypothyroidism   . Myocardial infarction ([place])   . Peripheral vascular disease ([place])   . Pulmonary emboli ([place]) 03/20/2015   elevated d-dimer, intermediate V/Q study, atypical chest pain and SOB. Start on Xarelto 20mg  BID for 3 month  . Renal insufficiency   . Shortness of breath dyspnea   . Urinary frequency   . Urinary urgency     Past Surgical History:  Procedure Laterality Date  . APPENDECTOMY    . BACK SURGERY     5 times  . big toe surgery    . CARDIAC CATHETERIZATION     2010    dr Acie Fredrickson  . cataract surgery     left eye  . COLONOSCOPY    . CORONARY ARTERY BYPASS GRAFT N/A 04/05/2015   Procedure: CORONARY ARTERY BYPASS GRAFTING (CABG)X4 LIMA-LAD; SVG-DIAG1-DIAG2; SVG-PD;  Surgeon: [name], MD;  Location: [place];  Service: Open Heart Surgery;  Laterality: N/A;  . CYSTOSCOPY    . ENDARTERECTOMY Left 04/24/2016   Procedure: ENDARTERECTOMY LEFT CAROTID;  Surgeon: Mal Misty, MD;  Location: [place];  Service: Vascular;  Laterality: Left;  . EYE SURGERY    . FEMORAL ARTERY - POPLITEAL ARTERY BYPASS GRAFT    . JOINT  REPLACEMENT     shoulder  . LEFT HEART CATHETERIZATION WITH CORONARY ANGIOGRAM N/A 04/03/2015   Procedure: LEFT HEART CATHETERIZATION WITH CORONARY ANGIOGRAM;  Surgeon: Troy Sine, MD;  Location[facility] CATH LAB;  Service: Cardiovascular;  Laterality: N/A;  . LUMBAR LAMINECTOMY  01/06/2013   Procedure: MICRODISCECTOMY LUMBAR LAMINECTOMY;  Surgeon: Marybelle Killings, MD;  Location: [place];  Service: Orthopedics;  Laterality: N/A;  L3-4 decompression  . LUMBAR LAMINECTOMY/DECOMPRESSION MICRODISCECTOMY  02/12/2012   Procedure: LUMBAR LAMINECTOMY/DECOMPRESSION MICRODISCECTOMY;  Surgeon: Floyce Stakes, MD;  Location: [place] NEURO ORS;  Service: Neurosurgery;  Laterality: N/A;  Lumbar four-five laminectomy  . PATCH ANGIOPLASTY Left 04/24/2016   Procedure: LEFT CAROTID ARTERY PATCH ANGIOPLASTY;  Surgeon: Mal Misty, MD;  Location: [place];  Service: Vascular;  Laterality: Left;  . STERIOD INJECTION Right 01/09/2014   Procedure: STEROID INJECTION;  Surgeon: Mcarthur Rossetti, MD;  Location: [place];  Service: Orthopedics;  Laterality: Right;  . TEE WITHOUT CARDIOVERSION N/A 04/05/2015   Procedure: TRANSESOPHAGEAL ECHOCARDIOGRAM (TEE);  Surgeon: [name], MD;  Location: [place];  Service: Open Heart Surgery;  Laterality: N/A;  . TOTAL HIP ARTHROPLASTY Left 01/09/2014   DR Ninfa Linden  .  TOTAL HIP ARTHROPLASTY Left 01/09/2014   Procedure: LEFT TOTAL HIP ARTHROPLASTY ANTERIOR APPROACH and Steroid Injection Right hip;  Surgeon: Mcarthur Rossetti, MD;  Location: [place];  Service: Orthopedics;  Laterality: Left;    Family History  Problem Relation Age of Onset  . Heart disease Father   . Heart attack Father   . Heart disease Sister   . Hypertension Sister   . Heart attack Sister   . Hypertension Mother   . Diabetes Son    Social History:  reports that he quit smoking about 29 years ago. His smoking use included Cigarettes. He quit smokeless tobacco use about 7 years ago. His smokeless tobacco use  included Chew. He reports that he does not drink alcohol or use drugs.  Allergies:  Allergies  Allergen Reactions  . Codeine Nausea And Vomiting       . Zetia [Ezetimibe] Other (See Comments)    Myalgias     Medications Prior to Admission  Medication Sig Dispense Refill  . amLODipine (NORVASC) 5 MG tablet Take 5 mg by mouth daily.     . rosuvastatin (CRESTOR) 10 MG tablet Take 5 mg by mouth every Monday, Wednesday, and Friday.     . nitroGLYCERIN (NITROSTAT) 0.4 MG SL tablet Place 1 tablet (0.4 mg total) under the tongue every 5 (five) minutes as needed for chest pain. 25 tablet 6    No results found for this or any previous visit (from the past 48 hour(s)). No results found.  Review of Systems  Constitutional: Negative for chills and fever.  HENT: Negative for hearing loss.   Eyes: Negative for blurred vision and double vision.  Respiratory: Negative for cough and hemoptysis.   Cardiovascular: Negative for chest pain and palpitations.  Gastrointestinal: Negative for abdominal pain, nausea and vomiting.  Genitourinary: Negative for dysuria and urgency.  Musculoskeletal: Negative for myalgias and neck pain.  Skin: Negative for itching and rash.  Neurological: Negative for dizziness, tingling and headaches.  Endo/Heme/Allergies: Does not bruise/bleed easily.  Psychiatric/Behavioral: Negative for depression and suicidal ideas.    Blood pressure (!) 168/68, pulse 88, temperature 98.9 F (37.2 C), temperature source Oral, resp. rate 20, weight 79.4 kg (175 lb), SpO2 98 %. Physical Exam  Nursing note and vitals reviewed. Constitutional: He is oriented to person, place, and time. He appears well-developed and well-nourished.  HENT:  Head: Normocephalic and atraumatic.  Eyes: Conjunctivae and EOM are normal. No scleral icterus.  Neck: Normal range of motion. Neck supple.  Cardiovascular: Normal rate and regular rhythm.   Respiratory: Effort normal and breath sounds normal. He  has no wheezes. He has no rales. He exhibits no tenderness.  GI: Soft. He exhibits no distension. There is no tenderness. There is no rebound.  Well healed midline scar  Musculoskeletal: Normal range of motion. He exhibits no edema.  Neurological: He is alert and oriented to person, place, and time.  Skin: Skin is warm and dry.  Psychiatric: He has a normal mood and affect. His behavior is normal.     Assessment/Plan 75 yo male with chronic cholecystitis -lap chole today -overnight observation -VTE prophylaxis  Mickeal Skinner, MD 07/27/2016, 11:22 AM

## 2016-07-27 NOTE — Anesthesia Procedure Notes (Signed)
Procedure Name: Intubation Date/Time: 07/27/2016 11:49 AM Performed by: Manuela Schwartz B Pre-anesthesia Checklist: Patient identified, Emergency Drugs available, Suction available, Patient being monitored and Timeout performed Patient Re-evaluated:Patient Re-evaluated prior to inductionOxygen Delivery Method: Circle system utilized Preoxygenation: Pre-oxygenation with 100% oxygen Intubation Type: IV induction Ventilation: Mask ventilation without difficulty Laryngoscope Size: Mac and 3 Grade View: Grade I Tube type: Oral Tube size: 7.5 mm Number of attempts: 1 Airway Equipment and Method: Stylet Placement Confirmation: ETT inserted through vocal cords under direct vision,  positive ETCO2 and breath sounds checked- equal and bilateral Secured at: 22 cm Tube secured with: Tape Dental Injury: Teeth and Oropharynx as per pre-operative assessment

## 2016-07-28 ENCOUNTER — Encounter (HOSPITAL_COMMUNITY): Payer: Self-pay | Admitting: General Surgery

## 2016-07-28 DIAGNOSIS — I509 Heart failure, unspecified: Secondary | ICD-10-CM | POA: Diagnosis not present

## 2016-07-28 DIAGNOSIS — I110 Hypertensive heart disease with heart failure: Secondary | ICD-10-CM | POA: Diagnosis not present

## 2016-07-28 DIAGNOSIS — K811 Chronic cholecystitis: Secondary | ICD-10-CM | POA: Diagnosis not present

## 2016-07-28 DIAGNOSIS — M1990 Unspecified osteoarthritis, unspecified site: Secondary | ICD-10-CM | POA: Diagnosis not present

## 2016-07-28 DIAGNOSIS — Z951 Presence of aortocoronary bypass graft: Secondary | ICD-10-CM | POA: Diagnosis not present

## 2016-07-28 DIAGNOSIS — I2510 Atherosclerotic heart disease of native coronary artery without angina pectoris: Secondary | ICD-10-CM | POA: Diagnosis not present

## 2016-07-28 MED ORDER — PNEUMOCOCCAL VAC POLYVALENT 25 MCG/0.5ML IJ INJ: 0.5000 mL | INJECTION | INTRAMUSCULAR | Status: DC

## 2016-07-28 MED ORDER — TAMSULOSIN HCL 0.4 MG PO CAPS
0.4000 mg | ORAL_CAPSULE | Freq: Once | ORAL | Status: AC
  Administered 2016-07-28: 0.4 mg via ORAL
  Filled 2016-07-28: qty 1

## 2016-07-28 MED ORDER — HYDROCODONE-ACETAMINOPHEN 5-325 MG PO TABS: 1.0000 | ORAL_TABLET | Freq: Four times a day (QID) | ORAL | 0 refills | Status: DC | PRN

## 2016-07-28 NOTE — Discharge Summary (Addendum)
Physician Discharge Summary  Patient ID: RUTHERFORD GROVES MRN: [id number] DOB/AGE: March 14, 1941 75 y.o.  Admit date: 07/27/2016 Discharge date: 07/28/2016  Admission Diagnoses:  Discharge Diagnoses:  Active Problems:   Chronic cholecystitis   Discharged Condition: good  Hospital Course: 75 yo male underwent lap chole. Post op he did well. He had urinary retention which has happened previously after surgery and required in and out cath. He has catheterized himself at home previously and would prefer to go home and do that. He tolerated a diet and ambulated well and was discharged home.  Consults: None  Significant Diagnostic Studies: labs: Hgb 14.3  Treatments: surgery: lap chole  Discharge Exam: Blood pressure 121/66, pulse (!) 104, temperature 98 F (36.7 C), temperature source Oral, resp. rate 16, weight 79.4 kg (175 lb), SpO2 96 %. General appearance: alert and cooperative Head: Normocephalic, without obvious abnormality, atraumatic Resp: clear to auscultation bilaterally Cardio: S1 S2 tachycardic GI: wound c/d/i, no erythema  Disposition: 01-Home or Self Care  Discharge Instructions    Call MD for:  difficulty breathing, headache or visual disturbances    Complete by:  As directed   Call MD for:  persistant nausea and vomiting    Complete by:  As directed   Call MD for:  redness, tenderness, or signs of infection (pain, swelling, redness, odor or green/yellow discharge around incision site)    Complete by:  As directed   Call MD for:  severe uncontrolled pain    Complete by:  As directed   Call MD for:  temperature >100.4    Complete by:  As directed   Diet - low sodium heart healthy    Complete by:  As directed   Discharge instructions    Complete by:  As directed   Continue intermittent catheterization if unable to void after 8h  Recommend 2 weeks flomax at previous prescribed dose.   Discharge wound care:    Complete by:  As directed   Ok to shower tomorrow  Glue  will likely peel off in 1-3 weeks   Increase activity slowly    Complete by:  As directed   Lifting restrictions    Complete by:  As directed   Do not lift more than 20 pounds for 3-4 weeks       Medication List    TAKE these medications   amLODipine 5 MG tablet Commonly known as:  NORVASC Take 5 mg by mouth daily.   HYDROcodone-acetaminophen 5-325 MG tablet Commonly known as:  NORCO/VICODIN Take 1 tablet by mouth every 6 (six) hours as needed for moderate pain.   nitroGLYCERIN 0.4 MG SL tablet Commonly known as:  NITROSTAT Place 1 tablet (0.4 mg total) under the tongue every 5 (five) minutes as needed for chest pain.   rosuvastatin 10 MG tablet Commonly known as:  CRESTOR Take 5 mg by mouth every Monday, Wednesday, and Friday.      Follow-up Information    Arta Bruce Marshell Dilauro, MD Follow up in 3 week(s).   Specialty:  General Surgery Contact information: [address] [place] [address] [phone]           Signed: Arta Bruce Lenoria Narine 07/28/2016, 9:49 AM

## 2016-07-28 NOTE — Care Management Obs Status (Signed)
[place] NOTIFICATION   Patient Details  Name: Ryan [name] MRN: [id number] Date of Birth: 15-Nov-1941   Medicare Observation Status Notification Given:  Yes (Medicare observation procedure )    Marilu Favre, RN 07/28/2016, 10:13 AM

## 2016-07-28 NOTE — Progress Notes (Signed)
Pt had difficulty voiding, bladder scan  Done with 683 ml of urine noted, In and out catheterization done, obtained 700 ml, pt tolerated and relief noted.

## 2016-07-28 NOTE — Progress Notes (Signed)
Discharge home. Home discharge instruction given, no question verbalized. 

## 2016-08-12 DIAGNOSIS — M109 Gout, unspecified: Secondary | ICD-10-CM | POA: Diagnosis not present

## 2016-08-12 DIAGNOSIS — E784 Other hyperlipidemia: Secondary | ICD-10-CM | POA: Diagnosis not present

## 2016-08-12 DIAGNOSIS — R7301 Impaired fasting glucose: Secondary | ICD-10-CM | POA: Diagnosis not present

## 2016-08-12 DIAGNOSIS — E038 Other specified hypothyroidism: Secondary | ICD-10-CM | POA: Diagnosis not present

## 2016-08-12 DIAGNOSIS — Z125 Encounter for screening for malignant neoplasm of prostate: Secondary | ICD-10-CM | POA: Diagnosis not present

## 2016-08-12 DIAGNOSIS — I10 Essential (primary) hypertension: Secondary | ICD-10-CM | POA: Diagnosis not present

## 2016-08-18 DIAGNOSIS — Z1389 Encounter for screening for other disorder: Secondary | ICD-10-CM | POA: Diagnosis not present

## 2016-08-18 DIAGNOSIS — E038 Other specified hypothyroidism: Secondary | ICD-10-CM | POA: Diagnosis not present

## 2016-08-18 DIAGNOSIS — E784 Other hyperlipidemia: Secondary | ICD-10-CM | POA: Diagnosis not present

## 2016-08-18 DIAGNOSIS — M109 Gout, unspecified: Secondary | ICD-10-CM | POA: Diagnosis not present

## 2016-08-18 DIAGNOSIS — Z23 Encounter for immunization: Secondary | ICD-10-CM | POA: Diagnosis not present

## 2016-08-18 DIAGNOSIS — Z6829 Body mass index (BMI) 29.0-29.9, adult: Secondary | ICD-10-CM | POA: Diagnosis not present

## 2016-08-18 DIAGNOSIS — Z0000 Encounter for general adult medical examination without abnormal findings: Secondary | ICD-10-CM | POA: Diagnosis not present

## 2016-08-18 DIAGNOSIS — R9721 Rising PSA following treatment for malignant neoplasm of prostate: Secondary | ICD-10-CM | POA: Diagnosis not present

## 2016-08-18 DIAGNOSIS — I6529 Occlusion and stenosis of unspecified carotid artery: Secondary | ICD-10-CM | POA: Diagnosis not present

## 2016-08-18 DIAGNOSIS — I739 Peripheral vascular disease, unspecified: Secondary | ICD-10-CM | POA: Diagnosis not present

## 2016-08-18 DIAGNOSIS — I25810 Atherosclerosis of coronary artery bypass graft(s) without angina pectoris: Secondary | ICD-10-CM | POA: Diagnosis not present

## 2016-08-18 DIAGNOSIS — I10 Essential (primary) hypertension: Secondary | ICD-10-CM | POA: Diagnosis not present

## 2016-08-18 DIAGNOSIS — R7301 Impaired fasting glucose: Secondary | ICD-10-CM | POA: Diagnosis not present

## 2016-08-26 DIAGNOSIS — Z1212 Encounter for screening for malignant neoplasm of rectum: Secondary | ICD-10-CM | POA: Diagnosis not present

## 2016-09-09 ENCOUNTER — Other Ambulatory Visit: Payer: Self-pay | Admitting: General Surgery

## 2016-09-09 DIAGNOSIS — K8020 Calculus of gallbladder without cholecystitis without obstruction: Secondary | ICD-10-CM | POA: Diagnosis not present

## 2016-09-10 ENCOUNTER — Other Ambulatory Visit: Payer: Medicare Other

## 2016-09-24 ENCOUNTER — Other Ambulatory Visit: Payer: Medicare Other

## 2016-09-29 ENCOUNTER — Ambulatory Visit
Admission: RE | Admit: 2016-09-29 | Discharge: 2016-09-29 | Disposition: A | Discharge status: Home / Self Care | Payer: Medicare Other | Source: Ambulatory Visit | Attending: General Surgery | Admitting: General Surgery

## 2016-09-29 ENCOUNTER — Other Ambulatory Visit: Payer: Medicare Other

## 2016-09-29 DIAGNOSIS — R109 Unspecified abdominal pain: Secondary | ICD-10-CM | POA: Diagnosis not present

## 2016-09-29 DIAGNOSIS — K8020 Calculus of gallbladder without cholecystitis without obstruction: Secondary | ICD-10-CM

## 2016-10-19 ENCOUNTER — Emergency Department (HOSPITAL_COMMUNITY): Payer: Medicare Other

## 2016-10-19 ENCOUNTER — Emergency Department (HOSPITAL_COMMUNITY)
Admission: EM | Admit: 2016-10-19 | Discharge: 2016-10-19 | Disposition: A | Discharge status: Home / Self Care | Payer: Medicare Other | Attending: Emergency Medicine | Admitting: Emergency Medicine

## 2016-10-19 ENCOUNTER — Encounter (HOSPITAL_COMMUNITY): Payer: Self-pay | Admitting: *Deleted

## 2016-10-19 DIAGNOSIS — E039 Hypothyroidism, unspecified: Secondary | ICD-10-CM | POA: Diagnosis not present

## 2016-10-19 DIAGNOSIS — R079 Chest pain, unspecified: Secondary | ICD-10-CM | POA: Insufficient documentation

## 2016-10-19 DIAGNOSIS — M25511 Pain in right shoulder: Secondary | ICD-10-CM | POA: Diagnosis not present

## 2016-10-19 DIAGNOSIS — R109 Unspecified abdominal pain: Secondary | ICD-10-CM

## 2016-10-19 DIAGNOSIS — I2510 Atherosclerotic heart disease of native coronary artery without angina pectoris: Secondary | ICD-10-CM | POA: Insufficient documentation

## 2016-10-19 DIAGNOSIS — R1011 Right upper quadrant pain: Secondary | ICD-10-CM | POA: Insufficient documentation

## 2016-10-19 DIAGNOSIS — Z96642 Presence of left artificial hip joint: Secondary | ICD-10-CM | POA: Diagnosis not present

## 2016-10-19 DIAGNOSIS — I509 Heart failure, unspecified: Secondary | ICD-10-CM | POA: Insufficient documentation

## 2016-10-19 DIAGNOSIS — I110 Hypertensive heart disease with heart failure: Secondary | ICD-10-CM | POA: Insufficient documentation

## 2016-10-19 DIAGNOSIS — Z951 Presence of aortocoronary bypass graft: Secondary | ICD-10-CM | POA: Insufficient documentation

## 2016-10-19 DIAGNOSIS — R1031 Right lower quadrant pain: Secondary | ICD-10-CM | POA: Diagnosis not present

## 2016-10-19 DIAGNOSIS — Z87891 Personal history of nicotine dependence: Secondary | ICD-10-CM | POA: Diagnosis not present

## 2016-10-19 DIAGNOSIS — R0789 Other chest pain: Secondary | ICD-10-CM | POA: Diagnosis not present

## 2016-10-19 DIAGNOSIS — N200 Calculus of kidney: Secondary | ICD-10-CM | POA: Diagnosis not present

## 2016-10-19 LAB — COMPREHENSIVE METABOLIC PANEL
ALK PHOS: 48 U/L (ref 38–126)
ALT: 14 U/L — AB (ref 17–63)
ANION GAP: 8 (ref 5–15)
AST: 20 U/L (ref 15–41)
Albumin: 4 g/dL (ref 3.5–5.0)
BILIRUBIN TOTAL: 0.9 mg/dL (ref 0.3–1.2)
BUN: 11 mg/dL (ref 6–20)
CALCIUM: 9.2 mg/dL (ref 8.9–10.3)
CO2: 24 mmol/L (ref 22–32)
CREATININE: 1.07 mg/dL (ref 0.61–1.24)
Chloride: 108 mmol/L (ref 101–111)
Glucose, Bld: 132 mg/dL — ABNORMAL HIGH (ref 65–99)
Potassium: 4.2 mmol/L (ref 3.5–5.1)
Sodium: 140 mmol/L (ref 135–145)
TOTAL PROTEIN: 7.1 g/dL (ref 6.5–8.1)

## 2016-10-19 LAB — CBC
HCT: 45.4 % (ref 39.0–52.0)
Hemoglobin: 15.2 g/dL (ref 13.0–17.0)
MCH: 31.5 pg (ref 26.0–34.0)
MCHC: 33.5 g/dL (ref 30.0–36.0)
MCV: 94.2 fL (ref 78.0–100.0)
PLATELETS: 248 10*3/uL (ref 150–400)
RBC: 4.82 MIL/uL (ref 4.22–5.81)
RDW: 13.8 % (ref 11.5–15.5)
WBC: 8.2 10*3/uL (ref 4.0–10.5)

## 2016-10-19 LAB — LIPASE, BLOOD: Lipase: 27 U/L (ref 11–51)

## 2016-10-19 LAB — I-STAT TROPONIN, ED: TROPONIN I, POC: 0.06 ng/mL (ref 0.00–0.08)

## 2016-10-19 MED ORDER — IOPAMIDOL (ISOVUE-370) INJECTION 76%
INTRAVENOUS | Status: AC
  Administered 2016-10-19: 100 mL via INTRAVENOUS
  Filled 2016-10-19: qty 100

## 2016-10-19 MED ORDER — LIDOCAINE 5 % EX PTCH: 1.0000 | MEDICATED_PATCH | CUTANEOUS | 0 refills | Status: DC

## 2016-10-19 MED ORDER — METHOCARBAMOL 500 MG PO TABS: 500.0000 mg | ORAL_TABLET | Freq: Three times a day (TID) | ORAL | 0 refills | Status: DC | PRN

## 2016-10-19 MED ORDER — LIDOCAINE 5 % EX PTCH
1.0000 | MEDICATED_PATCH | CUTANEOUS | Status: DC
  Administered 2016-10-19: 1 via TRANSDERMAL
  Filled 2016-10-19: qty 1

## 2016-10-19 NOTE — ED Provider Notes (Signed)
Emergency Department Provider Note   I have reviewed the triage vital signs and the nursing notes.   HISTORY  Chief Complaint Shoulder Pain and Dizziness   HPI Ryan [name] is a 75 y.o. male with PMH of CAD, GERD, HLD, HTN, DVT/PE, and hypothyroidism presents to the emergency department for evaluation of continued right shoulder pain radiating to his right upper quadrant. The patient reports it feels very similar to what he thought was gallbladder disease. He has since had a laparoscopic cholecystectomy with only transient improvement in symptoms. He reports that 2 weeks after surgery began having return of his pain and nausea in the same area. He went to his general surgeon who ordered a repeat ultrasound with no evidence of retained stone or ductal dilation. He denies associated fevers, dyspnea, vomiting, diarrhea. He denies any obvious exacerbating or alleviating factors. He notes he does have a history of pulmonary embolism from a DVT. He is also had prior acute MIs.   Past Medical History:  Diagnosis Date  . Arthritis   . CAD (coronary artery disease)   . Carotid artery occlusion   . Cataract    Bil eyes/worse in left eye  . CHF (congestive heart failure) ([place])   . Chronic back pain   . DVT (deep venous thrombosis) ([place])   . Dysrhythmia   . Enlarged prostate    takes Rapaflo daily  . GERD (gastroesophageal reflux disease)    occasional  . History of colon polyps   . History of gout    has colchicine prn  . History of kidney stones   . Hyperlipidemia    takes Crestor daily  . Hypertension    takes Amlodipine daily  . Hypothyroidism   . Myocardial infarction   . Peripheral vascular disease ([place])   . Pulmonary emboli ([place]) 03/20/2015   elevated d-dimer, intermediate V/Q study, atypical chest pain and SOB. Start on Xarelto 20mg  BID for 3 month  . Renal insufficiency   . Shortness of breath dyspnea   . Urinary frequency   . Urinary urgency     Patient Active  Problem List   Diagnosis Date Noted  . Chronic cholecystitis 07/27/2016  . PAD (peripheral artery disease) ([place]) 07/09/2015  . Carotid stenosis, asymptomatic 07/09/2015  . S/P CABG x 4 04/05/2015  . Pain in the chest   . Coronary artery disease due to calcified coronary lesion   . Atypical chest pain 03/20/2015  . Carotid stenosis 10/09/2014  . PVD (peripheral vascular disease) ([place]) 10/09/2014  . HTN (hypertension) 05/30/2014  . Arthritis of left hip 01/09/2014  . Status post THR (total hip replacement) 01/09/2014  . Spinal stenosis, lumbar 01/06/2013    Class: Diagnosis of  . Coronary artery disease 01/05/2013  . Atherosclerosis of native artery of extremity with intermittent claudication ([place]) 10/18/2012    Past Surgical History:  Procedure Laterality Date  . APPENDECTOMY    . BACK SURGERY     5 times  . big toe surgery    . CARDIAC CATHETERIZATION     2010    dr Acie Fredrickson  . cataract surgery     left eye  . CHOLECYSTECTOMY N/A 07/27/2016   Procedure: LAPAROSCOPIC CHOLECYSTECTOMY;  Surgeon: Mickeal Skinner, MD;  Location: [place];  Service: General;  Laterality: N/A;  . COLONOSCOPY    . CORONARY ARTERY BYPASS GRAFT N/A 04/05/2015   Procedure: CORONARY ARTERY BYPASS GRAFTING (CABG)X4 LIMA-LAD; SVG-DIAG1-DIAG2; SVG-PD;  Surgeon: [name], MD;  Location[facility]  OR;  Service: Open Heart Surgery;  Laterality: N/A;  . CYSTOSCOPY    . ENDARTERECTOMY Left 04/24/2016   Procedure: ENDARTERECTOMY LEFT CAROTID;  Surgeon: Mal Misty, MD;  Location: [place];  Service: Vascular;  Laterality: Left;  . EYE SURGERY    . FEMORAL ARTERY - POPLITEAL ARTERY BYPASS GRAFT    . JOINT REPLACEMENT     shoulder  . LEFT HEART CATHETERIZATION WITH CORONARY ANGIOGRAM N/A 04/03/2015   Procedure: LEFT HEART CATHETERIZATION WITH CORONARY ANGIOGRAM;  Surgeon: Troy Sine, MD;  Location: [facility] CATH LAB;  Service: Cardiovascular;  Laterality: N/A;  . LUMBAR LAMINECTOMY  01/06/2013   Procedure:  MICRODISCECTOMY LUMBAR LAMINECTOMY;  Surgeon: Marybelle Killings, MD;  Location: [place];  Service: Orthopedics;  Laterality: N/A;  L3-4 decompression  . LUMBAR LAMINECTOMY/DECOMPRESSION MICRODISCECTOMY  02/12/2012   Procedure: LUMBAR LAMINECTOMY/DECOMPRESSION MICRODISCECTOMY;  Surgeon: Floyce Stakes, MD;  Location: [place] NEURO ORS;  Service: Neurosurgery;  Laterality: N/A;  Lumbar four-five laminectomy  . PATCH ANGIOPLASTY Left 04/24/2016   Procedure: LEFT CAROTID ARTERY PATCH ANGIOPLASTY;  Surgeon: Mal Misty, MD;  Location: [place];  Service: Vascular;  Laterality: Left;  . STERIOD INJECTION Right 01/09/2014   Procedure: STEROID INJECTION;  Surgeon: Mcarthur Rossetti, MD;  Location: [place];  Service: Orthopedics;  Laterality: Right;  . TEE WITHOUT CARDIOVERSION N/A 04/05/2015   Procedure: TRANSESOPHAGEAL ECHOCARDIOGRAM (TEE);  Surgeon: [name], MD;  Location: [place];  Service: Open Heart Surgery;  Laterality: N/A;  . TOTAL HIP ARTHROPLASTY Left 01/09/2014   DR Ninfa Linden  . TOTAL HIP ARTHROPLASTY Left 01/09/2014   Procedure: LEFT TOTAL HIP ARTHROPLASTY ANTERIOR APPROACH and Steroid Injection Right hip;  Surgeon: Mcarthur Rossetti, MD;  Location: [place];  Service: Orthopedics;  Laterality: Left;    Current Outpatient Rx  . Order #: [id number] Class: Historical Med  . Order #: [id number] Class: Historical Med  . Order #: [id number] Class: Print  . Order #: [id number] Class: Normal  . Order #: [id number] Class: Historical Med  . Order #: [id number] Class: Historical Med  . Order #: [id number] Class: Print  . Order #: [id number] Class: Print    Allergies Codeine and Zetia [ezetimibe]  Family History  Problem Relation Age of Onset  . Heart disease Father   . Heart attack Father   . Heart disease Sister   . Hypertension Sister   . Heart attack Sister   . Hypertension Mother   . Diabetes Son     Social History Social History  Substance Use Topics  . Smoking status: Former Smoker     Types: Cigarettes    Quit date: 02/04/1987  . Smokeless tobacco: Former Systems developer    Types: [place] date: 07/20/2009     Comment: quit 35+yrs ago  . Alcohol use No    Review of Systems  Constitutional: No fever/chills Eyes: No visual changes. ENT: No sore throat. Cardiovascular: Denies chest pain. Positive right shoulder pain radiating down right chest to RUQ of abdomen. Respiratory: Denies shortness of breath. Gastrointestinal: Right lateral abdominal pain. Positive nausea, no vomiting.  No diarrhea.  No constipation. Genitourinary: Negative for dysuria. Musculoskeletal: Negative for back pain. Skin: Negative for rash. Neurological: Negative for headaches, focal weakness or numbness.  10-point ROS otherwise negative.  ____________________________________________   PHYSICAL EXAM:  VITAL SIGNS: ED Triage Vitals  Enc Vitals Group     BP 10/19/16 1044 169/80     Pulse Rate 10/19/16 1044 81     Resp 10/19/16 1044 17  Temp 10/19/16 1044 97.6 F (36.4 C)     Temp Source 10/19/16 1044 Oral     SpO2 10/19/16 1044 96 %     Weight 10/19/16 1044 173 lb (78.5 kg)     Height 10/19/16 1044 5\' 6"  (1.676 m)     Pain Score 10/19/16 1049 8   Constitutional: Alert and oriented. Well appearing and in no acute distress. Eyes: Conjunctivae are normal.  Head: Atraumatic. Nose: No congestion/rhinnorhea. Mouth/Throat: Mucous membranes are moist.  Oropharynx non-erythematous. Neck: No stridor.   Cardiovascular: Normal rate, regular rhythm. Good peripheral circulation. Grossly normal heart sounds.   Respiratory: Normal respiratory effort.  No retractions. Lungs CTAB. Gastrointestinal: Soft with tenderness to the RLQ and RUQ. No rebound or guarding. No distention.  Musculoskeletal: No lower extremity tenderness nor edema. No gross deformities of extremities. Neurologic:  Normal speech and language. No gross focal neurologic deficits are appreciated.  Skin:  Skin is warm, dry and intact. No  rash noted.  ____________________________________________   LABS (all labs ordered are listed, but only abnormal results are displayed)  Labs Reviewed  COMPREHENSIVE METABOLIC PANEL - Abnormal; Notable for the following:       Result Value   Glucose, Bld 132 (*)    ALT 14 (*)    All other components within normal limits  LIPASE, BLOOD  CBC  I-STAT TROPOININ, ED   ____________________________________________  EKG   EKG Interpretation  Date/Time:  Monday October 19 2016 10:47:04 EDT Ventricular Rate:  95 PR Interval:  160 QRS Duration: 78 QT Interval:  374 QTC Calculation: 469 R Axis:   61 Text Interpretation:  Sinus rhythm with Premature atrial complexes Cannot rule out Anterior infarct , age undetermined Abnormal ECG No STEMI. Similar to prior.  Confirmed by Sorren Vallier MD, Shantika Bermea [phone]) on 10/19/2016 11:42:27 AM       ____________________________________________  RADIOLOGY  Ct Angio Chest Pe W And/or Wo Contrast  Result Date: 10/19/2016 CLINICAL DATA:  Right-sided chest pain. EXAM: CT ANGIOGRAPHY CHEST CT ABDOMEN AND PELVIS WITH CONTRAST TECHNIQUE: Multidetector CT imaging of the chest was performed using the standard protocol during bolus administration of intravenous contrast. Multiplanar CT image reconstructions and MIPs were obtained to evaluate the vascular anatomy. Multidetector CT imaging of the abdomen and pelvis was performed using the standard protocol during bolus administration of intravenous contrast. CONTRAST:  100 mL Isovue 370 IV COMPARISON:  CT abdomen pelvis 12/27/2012 FINDINGS: CTA CHEST FINDINGS Cardiovascular: There is satisfactory opacification of the pulmonary arteries to the segmental level. There is no pulmonary embolus. The main pulmonary artery is within normal limits for size, measuring 2.9 cm at the bifurcation. There is no CT evidence of acute right heart strain. There is aortic and coronary artery atherosclerosis. There is a normal variant aortic  arch branching pattern with the brachiocephalic and left common carotid arteries sharing a common origin. Heart size is normal, without pericardial effusion. Mediastinum/Nodes: No mediastinal, hilar or axillary lymphadenopathy. The visualized thyroid and thoracic esophageal course are unremarkable. Lungs/Pleura: No pulmonary nodules or masses. No pleural effusion or pneumothorax. No focal airspace consolidation. No focal pleural abnormality. Musculoskeletal: No chest wall abnormality. No acute or significant osseous findings. Review of the MIP images confirms the above findings. CT ABDOMEN and PELVIS FINDINGS Hepatobiliary: There is a 1.2 cm hepatic cyst. No biliary dilatation. No portal venous gas. No ascites. The gallbladder surgically absent. Pancreas: Mild fatty replacement of the pancreas. Spleen: Normal Adrenals/Urinary Tract: The adrenal glands are normal. There are multiple bilateral  renal cysts. There is a 4 mm nonobstructing calculus near the upper pole the right kidney. No hydronephrosis or solid renal mass is identified. Stomach/Bowel: No dilated small bowel or other evidence of obstruction. No enteric or colonic inflammation. The appendix is surgically absent. Vascular/Lymphatic: There are multiple para-aortic surgical clips. There is atherosclerotic calcification of the aorta. No aortic aneurysm. The portal vein, splenic vein and superior mesenteric vein are patent. No abdominal lymphadenopathy. Reproductive: Streak artifact from left hip prosthesis obscures evaluation of the lower pelvis. Other: Excretory phase images of the kidneys are unremarkable. Musculoskeletal: Multilevel degenerative disc disease. No severe bony canal stenosis. No lytic or blastic osseous lesions. Review of the MIP images confirms the above findings. IMPRESSION: 1. No pulmonary embolus. 2. No acute abnormality of the chest, abdomen or pelvis. 3. Nonobstructing 4 mm right upper pole renal calculus. Electronically Signed   By:  Ulyses Jarred M.D.   On: 10/19/2016 14:44   Ct Abdomen Pelvis W Contrast  Result Date: 10/19/2016 CLINICAL DATA:  Right-sided chest pain. EXAM: CT ANGIOGRAPHY CHEST CT ABDOMEN AND PELVIS WITH CONTRAST TECHNIQUE: Multidetector CT imaging of the chest was performed using the standard protocol during bolus administration of intravenous contrast. Multiplanar CT image reconstructions and MIPs were obtained to evaluate the vascular anatomy. Multidetector CT imaging of the abdomen and pelvis was performed using the standard protocol during bolus administration of intravenous contrast. CONTRAST:  100 mL Isovue 370 IV COMPARISON:  CT abdomen pelvis 12/27/2012 FINDINGS: CTA CHEST FINDINGS Cardiovascular: There is satisfactory opacification of the pulmonary arteries to the segmental level. There is no pulmonary embolus. The main pulmonary artery is within normal limits for size, measuring 2.9 cm at the bifurcation. There is no CT evidence of acute right heart strain. There is aortic and coronary artery atherosclerosis. There is a normal variant aortic arch branching pattern with the brachiocephalic and left common carotid arteries sharing a common origin. Heart size is normal, without pericardial effusion. Mediastinum/Nodes: No mediastinal, hilar or axillary lymphadenopathy. The visualized thyroid and thoracic esophageal course are unremarkable. Lungs/Pleura: No pulmonary nodules or masses. No pleural effusion or pneumothorax. No focal airspace consolidation. No focal pleural abnormality. Musculoskeletal: No chest wall abnormality. No acute or significant osseous findings. Review of the MIP images confirms the above findings. CT ABDOMEN and PELVIS FINDINGS Hepatobiliary: There is a 1.2 cm hepatic cyst. No biliary dilatation. No portal venous gas. No ascites. The gallbladder surgically absent. Pancreas: Mild fatty replacement of the pancreas. Spleen: Normal Adrenals/Urinary Tract: The adrenal glands are normal. There are  multiple bilateral renal cysts. There is a 4 mm nonobstructing calculus near the upper pole the right kidney. No hydronephrosis or solid renal mass is identified. Stomach/Bowel: No dilated small bowel or other evidence of obstruction. No enteric or colonic inflammation. The appendix is surgically absent. Vascular/Lymphatic: There are multiple para-aortic surgical clips. There is atherosclerotic calcification of the aorta. No aortic aneurysm. The portal vein, splenic vein and superior mesenteric vein are patent. No abdominal lymphadenopathy. Reproductive: Streak artifact from left hip prosthesis obscures evaluation of the lower pelvis. Other: Excretory phase images of the kidneys are unremarkable. Musculoskeletal: Multilevel degenerative disc disease. No severe bony canal stenosis. No lytic or blastic osseous lesions. Review of the MIP images confirms the above findings. IMPRESSION: 1. No pulmonary embolus. 2. No acute abnormality of the chest, abdomen or pelvis. 3. Nonobstructing 4 mm right upper pole renal calculus. Electronically Signed   By: Ulyses Jarred M.D.   On: 10/19/2016 14:44  ____________________________________________   PROCEDURES  Procedure(s) performed:   Procedures  None ____________________________________________   INITIAL IMPRESSION / ASSESSMENT AND PLAN / ED COURSE  Pertinent labs & imaging results that were available during my care of the patient were reviewed by me and considered in my medical decision making (see chart for details).  Patient resents emergency department for evaluation of right shoulder pain radiating to his right upper quadrant of his abdomen through his right chest. He has multiple medical comorbidities including prior heart attacks, pulmonary emboli, and recent laparoscopic cholecystectomy. He has an ultrasound from earlier this month which did not show retained stone or fluid collection in the gallbladder bed. No fevers or chills. Plan for troponin,  labs, and CT scan of the chest, abdomen, pelvis. Given the distribution of pain concerned that this could just as easily be right lower lung pulmonary infarct from pulmonary embolism versus intra-abdominal etiology for pain given recent surgery and right lower quadrant tenderness on palpation.  03:24 PM CT scan of the chest, abdomen, pelvis is negative for pulmonary embolism or other etiology that might cause pain in this area. Plan to apply lidocaine patch to the shoulder and began a muscle relaxer. He will not take this in conjunction with his hydrocodone. He is to follow up with his primary care physician in the coming week. Discussed return precautions in detail. The patient is pleased at discharge.  At this time, I do not feel there is any life-threatening condition present. I have reviewed and discussed all results (EKG, imaging, lab, urine as appropriate), exam findings with patient. I have reviewed nursing notes and appropriate previous records.  I feel the patient is safe to be discharged home without further emergent workup. Discussed usual and customary return precautions. Patient and family (if present) verbalize understanding and are comfortable with this plan.  Patient will follow-up with their primary care provider. If they do not have a primary care provider, information for follow-up has been provided to them. All questions have been answered.  ____________________________________________  FINAL CLINICAL IMPRESSION(S) / ED DIAGNOSES  Final diagnoses:  Chest pain, unspecified type  Right sided abdominal pain  Acute pain of right shoulder     MEDICATIONS GIVEN DURING THIS VISIT:  Medications  lidocaine (LIDODERM) 5 % 1 patch (1 patch Transdermal Patch Applied 10/19/16 1540)  iopamidol (ISOVUE-370) 76 % injection (100 mLs Intravenous Contrast Given 10/19/16 1357)     NEW OUTPATIENT MEDICATIONS STARTED DURING THIS VISIT:  Discharge Medication List as of 10/19/2016  3:32 PM     START taking these medications   Details  lidocaine (LIDODERM) 5 % Place 1 patch onto the skin daily. Remove & Discard patch within 12 hours or as directed by MD, Starting Mon 10/19/2016, Print    methocarbamol (ROBAXIN) 500 MG tablet Take 1 tablet (500 mg total) by mouth every 8 (eight) hours as needed for muscle spasms., Starting Mon 10/19/2016, Print          Note:  This document was prepared using Dragon voice recognition software and may include unintentional dictation errors.  Nanda Quinton, MD Emergency Medicine   Margette Fast, MD 10/19/16 [phone]

## 2016-10-19 NOTE — ED Notes (Signed)
Patient transported to CT 

## 2016-10-19 NOTE — Discharge Instructions (Signed)
You have been seen in the Emergency Department (ED) for abdominal pain.  Your evaluation did not identify a clear cause of your symptoms but was generally reassuring.  We prescribed a medication for muscle relaxation that you should take with caution and not together with your hydrocodone. Do not drive or operate heavy machinery while taking this medication.   Please follow up as instructed above regarding today?s emergent visit and the symptoms that are bothering you.  Return to the ED if your abdominal pain worsens or fails to improve, you develop bloody vomiting, bloody diarrhea, you are unable to tolerate fluids due to vomiting, fever greater than 101, or other symptoms that concern you.

## 2016-10-19 NOTE — ED Triage Notes (Signed)
Pt states R shoulder pain that radiates to R shoulder blade and down R ant ribs.  Not increased with movement.  Hx of gallstones and states pain is similar (cholecystectomy 21.5 months ago).  Also c/o intermittent dizziness, not r/t pain.

## 2016-11-19 DIAGNOSIS — M898X1 Other specified disorders of bone, shoulder: Secondary | ICD-10-CM | POA: Diagnosis not present

## 2016-11-19 DIAGNOSIS — Z6829 Body mass index (BMI) 29.0-29.9, adult: Secondary | ICD-10-CM | POA: Diagnosis not present

## 2016-11-19 DIAGNOSIS — R1084 Generalized abdominal pain: Secondary | ICD-10-CM | POA: Diagnosis not present

## 2016-11-20 ENCOUNTER — Ambulatory Visit: Payer: Medicare Other | Admitting: Vascular Surgery

## 2016-11-20 ENCOUNTER — Encounter (HOSPITAL_COMMUNITY): Payer: Medicare Other

## 2016-11-23 ENCOUNTER — Encounter: Payer: Self-pay | Admitting: Vascular Surgery

## 2016-11-27 ENCOUNTER — Ambulatory Visit (HOSPITAL_COMMUNITY)
Admission: RE | Admit: 2016-11-27 | Discharge: 2016-11-27 | Disposition: A | Discharge status: Home / Self Care | Payer: Medicare Other | Source: Ambulatory Visit | Attending: Vascular Surgery | Admitting: Vascular Surgery

## 2016-11-27 ENCOUNTER — Encounter: Payer: Self-pay | Admitting: Vascular Surgery

## 2016-11-27 ENCOUNTER — Ambulatory Visit (INDEPENDENT_AMBULATORY_CARE_PROVIDER_SITE_OTHER): Payer: Medicare Other | Admitting: Vascular Surgery

## 2016-11-27 VITALS — BP 154/92 | HR 76 | Temp 97.0°F | Resp 18 | Ht 67.0 in | Wt 175.0 lb

## 2016-11-27 DIAGNOSIS — I6523 Occlusion and stenosis of bilateral carotid arteries: Secondary | ICD-10-CM

## 2016-11-27 DIAGNOSIS — I6522 Occlusion and stenosis of left carotid artery: Secondary | ICD-10-CM

## 2016-11-27 LAB — VAS US CAROTID
LCCADDIAS: 14 cm/s
LCCADSYS: 102 cm/s
LCCAPDIAS: 15 cm/s
LCCAPSYS: 104 cm/s
LEFT VERTEBRAL DIAS: 11 cm/s
LICADSYS: -78 cm/s
Left ICA dist dias: -15 cm/s
RCCADSYS: -102 cm/s
RCCAPSYS: 91 cm/s
RIGHT CCA MID DIAS: 11 cm/s
RIGHT ECA DIAS: -9 cm/s
RIGHT VERTEBRAL DIAS: -8 cm/s
Right CCA prox dias: 13 cm/s

## 2016-11-27 NOTE — Progress Notes (Signed)
Patient ID: Ryan [name], male   DOB: [birth year], 75 y.o.   MRN: [id number]  Reason for Consult: Carotid (6 month f/u)   Referred by Marton Redwood, MD  Subjective:     HPI:  Ryan [name] is a 75 y.o. male follows up from carotid endarterectomy performed last May by Dr. Kellie Simmering. He does not have stroke TIA or amaurosis. His main complaint today is hip pain with walking about 400 ft to his mailbox. He does have a history of an aortobifemoral bypass by Dr. Kellie Simmering in the early 90s. This is in his complaint for some time and even went left hip replacement by Dr. Rush Farmer which has not seemed to resolve his pain. His pain does resolve with rest and does seem to be claudication in nature. He does not have rest pain or tissue loss he does not have leg pain he is otherwise doing very well he is a nonsmoker does take aspirin and Crestor however he has been holding his medications from recent cholecystectomy. He does have some residual left neck numbness does have improvement in his swallowing over time the numbness is also improving.  Past Medical History:  Diagnosis Date  . Arthritis   . CAD (coronary artery disease)   . Carotid artery occlusion   . Cataract    Bil eyes/worse in left eye  . CHF (congestive heart failure) ([place])   . Chronic back pain   . DVT (deep venous thrombosis) ([place])   . Dysrhythmia   . Enlarged prostate    takes Rapaflo daily  . GERD (gastroesophageal reflux disease)    occasional  . History of colon polyps   . History of gout    has colchicine prn  . History of kidney stones   . Hyperlipidemia    takes Crestor daily  . Hypertension    takes Amlodipine daily  . Hypothyroidism   . Myocardial infarction   . Peripheral vascular disease ([place])   . Pulmonary emboli ([place]) 03/20/2015   elevated d-dimer, intermediate V/Q study, atypical chest pain and SOB. Start on Xarelto 20mg  BID for 3 month  . Renal insufficiency   . Shortness of breath dyspnea   . Urinary  frequency   . Urinary urgency    Family History  Problem Relation Age of Onset  . Heart disease Father   . Heart attack Father   . Heart disease Sister   . Hypertension Sister   . Heart attack Sister   . Hypertension Mother   . Diabetes Son    Past Surgical History:  Procedure Laterality Date  . APPENDECTOMY    . BACK SURGERY     5 times  . big toe surgery    . CARDIAC CATHETERIZATION     2010    dr Acie Fredrickson  . cataract surgery     left eye  . CHOLECYSTECTOMY N/A 07/27/2016   Procedure: LAPAROSCOPIC CHOLECYSTECTOMY;  Surgeon: Mickeal Skinner, MD;  Location: [facility];  Service: General;  Laterality: N/A;  . COLONOSCOPY    . CORONARY ARTERY BYPASS GRAFT N/A 04/05/2015   Procedure: CORONARY ARTERY BYPASS GRAFTING (CABG)X4 LIMA-LAD; SVG-DIAG1-DIAG2; SVG-PD;  Surgeon: [name], MD;  Location: [place];  Service: Open Heart Surgery;  Laterality: N/A;  . CYSTOSCOPY    . ENDARTERECTOMY Left 04/24/2016   Procedure: ENDARTERECTOMY LEFT CAROTID;  Surgeon: Mal Misty, MD;  Location: [facility];  Service: Vascular;  Laterality: Left;  . EYE SURGERY    . FEMORAL  ARTERY - POPLITEAL ARTERY BYPASS GRAFT    . JOINT REPLACEMENT     shoulder  . LEFT HEART CATHETERIZATION WITH CORONARY ANGIOGRAM N/A 04/03/2015   Procedure: LEFT HEART CATHETERIZATION WITH CORONARY ANGIOGRAM;  Surgeon: Troy Sine, MD;  [facility];  Service: Cardiovascular;  Laterality: N/A;  . LUMBAR LAMINECTOMY  01/06/2013   Procedure: MICRODISCECTOMY LUMBAR LAMINECTOMY;  Surgeon: Marybelle Killings, MD;  Location: [facility];  Service: Orthopedics;  Laterality: N/A;  L3-4 decompression  . LUMBAR LAMINECTOMY/DECOMPRESSION MICRODISCECTOMY  02/12/2012   Procedure: LUMBAR LAMINECTOMY/DECOMPRESSION MICRODISCECTOMY;  Surgeon: Floyce Stakes, MD;  Location: [place] NEURO ORS;  Service: Neurosurgery;  Laterality: N/A;  Lumbar four-five laminectomy  . PATCH ANGIOPLASTY Left 04/24/2016   Procedure: LEFT CAROTID ARTERY PATCH ANGIOPLASTY;   Surgeon: Mal Misty, MD;  Location: [place];  Service: Vascular;  Laterality: Left;  . STERIOD INJECTION Right 01/09/2014   Procedure: STEROID INJECTION;  Surgeon: Mcarthur Rossetti, MD;  Location: [place];  Service: Orthopedics;  Laterality: Right;  . TEE WITHOUT CARDIOVERSION N/A 04/05/2015   Procedure: TRANSESOPHAGEAL ECHOCARDIOGRAM (TEE);  Surgeon: [name], MD;  Location: [place];  Service: Open Heart Surgery;  Laterality: N/A;  . TOTAL HIP ARTHROPLASTY Left 01/09/2014   DR Ninfa Linden  . TOTAL HIP ARTHROPLASTY Left 01/09/2014   Procedure: LEFT TOTAL HIP ARTHROPLASTY ANTERIOR APPROACH and Steroid Injection Right hip;  Surgeon: Mcarthur Rossetti, MD;  Location: [place];  Service: Orthopedics;  Laterality: Left;    Short Social History:  Social History  Substance Use Topics  . Smoking status: Former Smoker    Types: Cigarettes    Quit date: 02/04/1987  . Smokeless tobacco: Former Systems developer    Types: Export date: 07/20/2009     Comment: quit 35+yrs ago  . Alcohol use No    Allergies  Allergen Reactions  . Codeine Nausea And Vomiting       . Zetia [Ezetimibe] Other (See Comments)    Myalgias     Current Outpatient Prescriptions  Medication Sig Dispense Refill  . methocarbamol (ROBAXIN) 500 MG tablet Take 1 tablet (500 mg total) by mouth every 8 (eight) hours as needed for muscle spasms. 10 tablet 0  . amLODipine (NORVASC) 5 MG tablet Take 5 mg by mouth daily.     Marland Kitchen aspirin 81 MG chewable tablet Chew 81 mg by mouth daily.    Marland Kitchen HYDROcodone-acetaminophen (NORCO/VICODIN) 5-325 MG tablet Take 1 tablet by mouth every 6 (six) hours as needed for moderate pain. (Patient not taking: Reported on 11/27/2016) 20 tablet 0  . lidocaine (LIDODERM) 5 % Place 1 patch onto the skin daily. Remove & Discard patch within 12 hours or as directed by MD (Patient not taking: Reported on 11/27/2016) 30 patch 0  . nitroGLYCERIN (NITROSTAT) 0.4 MG SL tablet Place 1 tablet (0.4 mg total) under  the tongue every 5 (five) minutes as needed for chest pain. (Patient not taking: Reported on 11/27/2016) 25 tablet 6  . RAPAFLO 8 MG CAPS capsule Take 8 mg by mouth daily.    . rosuvastatin (CRESTOR) 10 MG tablet Take 5 mg by mouth every Monday, Wednesday, and Friday.      No current facility-administered medications for this visit.     Review of Systems  Constitutional:  Constitutional negative. HENT: HENT negative.  Eyes: Eyes negative.  Respiratory: Respiratory negative.  Cardiovascular: Positive for claudication.  GI: Gastrointestinal negative.  Musculoskeletal: Positive for joint pain.  Skin: Skin negative.  Neurological: Neurological negative. Hematologic: Hematologic/lymphatic negative.  Psychiatric: Psychiatric negative.        Objective:  Objective   Vitals:   11/27/16 1007 11/27/16 1010 11/27/16 1011  BP: (!) 166/85 (!) 171/87 (!) 154/92  Pulse: 76 76 76  Resp: 18    Temp: 97 F (36.1 C)    SpO2: 98%    Weight: 175 lb (79.4 kg)    Height: 5\' 7"  (1.702 m)     Body mass index is 27.41 kg/m.  Physical Exam  Constitutional: He is oriented to person, place, and time. He appears well-developed.  HENT:  Head: Atraumatic.  Eyes: Pupils are equal, round, and reactive to light.  Neck: Normal range of motion.  Cardiovascular: Normal rate.   Pulses:      Carotid pulses are 2+ on the right side, and 2+ on the left side.      Radial pulses are 2+ on the right side, and 2+ on the left side.       Femoral pulses are 2+ on the right side, and 2+ on the left side.      Popliteal pulses are 2+ on the right side, and 2+ on the left side.  Pulmonary/Chest: Effort normal.  Abdominal: Soft. He exhibits no mass. There is no guarding.  Neurological: He is alert and oriented to person, place, and time.  Skin: Skin is warm and dry.  Psychiatric: He has a normal mood and affect. His behavior is normal. Judgment and thought content normal.    Data: Less than 40% right internal  carotid artery stenosis with a left carotid endarterectomy site that is patent without evidence of restenosis. Right ICA is 0000000 systolic velocity heterogeneous plaque.     Assessment/Plan:    75 year old male history of an aortobifemoral bypass and recent left carotid endarterectomy performed by Dr. Kellie Simmering. He is doing well from a carotid standpoint and will follow up in 6 months. His chief complaint is bilateral hip pain which appears to be claudication given his aortobifem this would likely be very difficult to remedy. I have encouraged him to continue walking to return on his aspirin and Crestor which he has been holding since cholecystectomy. He agrees femoral we will see him in 6 months should he not need to be seen sooner.     Waynetta Sandy MD [facility]

## 2016-11-27 NOTE — Progress Notes (Signed)
Vitals:   11/27/16 1007 11/27/16 1010  BP: (!) 166/85 (!) 171/87  Pulse: 76 76  Resp: 18   Temp: 97 F (36.1 C)   SpO2: 98%   Weight: 175 lb (79.4 kg)   Height: 5\' 7"  (1.702 m)

## 2016-11-27 NOTE — Addendum Note (Signed)
Addended by: Lianne Cure A on: 11/27/2016 12:42 PM   Modules accepted: Orders

## 2017-01-20 ENCOUNTER — Ambulatory Visit (INDEPENDENT_AMBULATORY_CARE_PROVIDER_SITE_OTHER): Payer: Medicare Other | Admitting: Physician Assistant

## 2017-01-20 ENCOUNTER — Telehealth: Payer: Self-pay | Admitting: *Deleted

## 2017-01-20 ENCOUNTER — Encounter: Payer: Self-pay | Admitting: Physician Assistant

## 2017-01-20 VITALS — BP 120/70 | HR 84 | Ht 66.0 in | Wt 175.0 lb

## 2017-01-20 DIAGNOSIS — E7800 Pure hypercholesterolemia, unspecified: Secondary | ICD-10-CM

## 2017-01-20 DIAGNOSIS — E785 Hyperlipidemia, unspecified: Secondary | ICD-10-CM | POA: Insufficient documentation

## 2017-01-20 DIAGNOSIS — I10 Essential (primary) hypertension: Secondary | ICD-10-CM | POA: Diagnosis not present

## 2017-01-20 DIAGNOSIS — I2510 Atherosclerotic heart disease of native coronary artery without angina pectoris: Secondary | ICD-10-CM | POA: Diagnosis not present

## 2017-01-20 DIAGNOSIS — I739 Peripheral vascular disease, unspecified: Secondary | ICD-10-CM

## 2017-01-20 DIAGNOSIS — I779 Disorder of arteries and arterioles, unspecified: Secondary | ICD-10-CM | POA: Diagnosis not present

## 2017-01-20 DIAGNOSIS — M549 Dorsalgia, unspecified: Secondary | ICD-10-CM | POA: Diagnosis not present

## 2017-01-20 LAB — HEPATIC FUNCTION PANEL
ALK PHOS: 58 IU/L (ref 39–117)
ALT: 9 IU/L (ref 0–44)
AST: 14 IU/L (ref 0–40)
Albumin: 4.1 g/dL (ref 3.5–4.8)
BILIRUBIN, DIRECT: 0.16 mg/dL (ref 0.00–0.40)
Bilirubin Total: 0.7 mg/dL (ref 0.0–1.2)
Total Protein: 6.8 g/dL (ref 6.0–8.5)

## 2017-01-20 LAB — LIPID PANEL
CHOLESTEROL TOTAL: 207 mg/dL — AB (ref 100–199)
Chol/HDL Ratio: 4.6 ratio units (ref 0.0–5.0)
HDL: 45 mg/dL (ref >39)
LDL Calculated: 138 mg/dL — ABNORMAL HIGH (ref 0–99)
TRIGLYCERIDES: 121 mg/dL (ref 0–149)
VLDL Cholesterol Cal: 24 mg/dL (ref 5–40)

## 2017-01-20 NOTE — Patient Instructions (Addendum)
Medication Instructions:  Your physician recommends that you continue on your current medications as directed. Please refer to the Current Medication list given to you today.  Labwork: TODAY LIPID AND LIVER PANEL  Testing/Procedures: 1. Your physician has requested that you have a lexiscan myoview. For further information please visit [email or web address]. Please follow instruction sheet, as given.  Follow-Up: 1. SCOTT WEAVER, PAC IN 1 MONTH SAME DAY DR. Acie Fredrickson IS INI THE OFFICE 2. YOU ARE BEING REFERRED TO [facility] ; DX BACK PAIN, S/P CHOLECYSTECTOMY  Any Other Special Instructions Will Be Listed Below (If Applicable).  If you need a refill on your cardiac medications before your next appointment, please call your pharmacy.

## 2017-01-20 NOTE — Telephone Encounter (Signed)
Pt notified of lab results and findings by phone. Pt agreeable to referral to Lipid Clinic. I will have the [facility] call pt tomorrow to schedule appt.

## 2017-01-20 NOTE — Progress Notes (Signed)
Cardiology Office Note:    Date:  01/20/2017   ID:  Ryan [name], DOB Aug 19, 1941, MRN [id number]  PCP:  Marton Redwood, MD  Cardiologist:  Dr. Liam Rogers   Electrophysiologist:  n/a  Referring MD: Marton Redwood, MD   Chief Complaint  Patient presents with  . Back Pain    History of Present Illness:    Ryan [name] is a 76 y.o. male with a hx of CAD s/p prior MIs, carotid artery disease, HTN, HL.  He was treated with Xarelto for suspected pulmonary embolism (Intermediate VQ scan) in 4/16.  However, he returned to the hospital with recurrent chest pain in 4/16 and ruled in for a myocardial infarction.  CT was neg for pulmonary embolism and a LHC demonstrated 3v CAD.  He was also noted to have severe L carotid stenosis.  He underwent CABG with L-LAD, S-D1/D2, S-PDA with Dr. Roxan Hockey.  He subsequently underwent L CEA with Dr. Kellie Simmering in 5/17.  Last seen by Dr. Liam Rogers in 7/17.  He underwent lap cholecystectomy in 8/17.  He presents today with continued symptoms of right scapular and right flank pain since his cholecystectomy. His symptoms are intermittent throughout the day. He denies any recurrent chest discomfort or shortness of breath like he had prior to his bypass surgery. Eating does not change his symptoms. He does feel nauseated and lightheaded at times with his symptoms. He denies vomiting or diarrhea. He apparently saw the surgeon that did his cholecystectomy. A CT scan was done. He tells me that no further recommendations were made. He did have an episode of sudden onset dyspnea about 3 weeks ago. He denies palpitations. He denies any recurrence. He denies exertional chest symptoms. He denies orthopnea, PND or edema. He denies syncope. He denies pleuritic chest symptoms or chest pain with lying supine.  Prior CV studies that were reviewed today include:    Carotid US 11/27/16 RCA <40; L CEA patent  LHC 4/16 LAD proximal 40-50, mid and mid-distal 95 and 80, D1 ostial  90, D2 mid 50 with 90 superior branch LCx AV groove 70-80 and 100 after OM1 takeoff RCA 100 proximal  EF 60  Myoview 3/16 Inf and inf-lat scar, EF 49, Intermediate Risk  Past Medical History:  Diagnosis Date  . Arthritis   . CAD (coronary artery disease)   . Carotid artery occlusion   . Cataract    Bil eyes/worse in left eye  . CHF (congestive heart failure) ([place])   . Chronic back pain   . DVT (deep venous thrombosis) ([place])   . Dysrhythmia   . Enlarged prostate    takes Rapaflo daily  . GERD (gastroesophageal reflux disease)    occasional  . History of colon polyps   . History of gout    has colchicine prn  . History of kidney stones   . Hyperlipidemia    takes Crestor daily  . Hypertension    takes Amlodipine daily  . Hypothyroidism   . Myocardial infarction   . Peripheral vascular disease ([place])   . Pulmonary emboli ([place]) 03/20/2015   elevated d-dimer, intermediate V/Q study, atypical chest pain and SOB. Start on Xarelto 20mg  BID for 3 month  . Renal insufficiency   . Shortness of breath dyspnea   . Urinary frequency   . Urinary urgency     Past Surgical History:  Procedure Laterality Date  . APPENDECTOMY    . BACK SURGERY     5 times  . big  toe surgery    . CARDIAC CATHETERIZATION     2010    dr Acie Fredrickson  . cataract surgery     left eye  . CHOLECYSTECTOMY N/A 07/27/2016   Procedure: LAPAROSCOPIC CHOLECYSTECTOMY;  Surgeon: Mickeal Skinner, MD;  Location: [place];  Service: General;  Laterality: N/A;  . COLONOSCOPY    . CORONARY ARTERY BYPASS GRAFT N/A 04/05/2015   Procedure: CORONARY ARTERY BYPASS GRAFTING (CABG)X4 LIMA-LAD; SVG-DIAG1-DIAG2; SVG-PD;  Surgeon: [name], MD;  Location: [place];  Service: Open Heart Surgery;  Laterality: N/A;  . CYSTOSCOPY    . ENDARTERECTOMY Left 04/24/2016   Procedure: ENDARTERECTOMY LEFT CAROTID;  Surgeon: Mal Misty, MD;  Location: [place];  Service: Vascular;  Laterality: Left;  . EYE SURGERY    . FEMORAL ARTERY  - POPLITEAL ARTERY BYPASS GRAFT    . JOINT REPLACEMENT     shoulder  . LEFT HEART CATHETERIZATION WITH CORONARY ANGIOGRAM N/A 04/03/2015   Procedure: LEFT HEART CATHETERIZATION WITH CORONARY ANGIOGRAM;  Surgeon: Troy Sine, MD;  [facility] CATH LAB;  Service: Cardiovascular;  Laterality: N/A;  . LUMBAR LAMINECTOMY  01/06/2013   Procedure: MICRODISCECTOMY LUMBAR LAMINECTOMY;  Surgeon: Marybelle Killings, MD;  Location: [facility];  Service: Orthopedics;  Laterality: N/A;  L3-4 decompression  . LUMBAR LAMINECTOMY/DECOMPRESSION MICRODISCECTOMY  02/12/2012   Procedure: LUMBAR LAMINECTOMY/DECOMPRESSION MICRODISCECTOMY;  Surgeon: Floyce Stakes, MD;  Location: [place] NEURO ORS;  Service: Neurosurgery;  Laterality: N/A;  Lumbar four-five laminectomy  . PATCH ANGIOPLASTY Left 04/24/2016   Procedure: LEFT CAROTID ARTERY PATCH ANGIOPLASTY;  Surgeon: Mal Misty, MD;  Location: [place];  Service: Vascular;  Laterality: Left;  . STERIOD INJECTION Right 01/09/2014   Procedure: STEROID INJECTION;  Surgeon: Mcarthur Rossetti, MD;  Location: [place];  Service: Orthopedics;  Laterality: Right;  . TEE WITHOUT CARDIOVERSION N/A 04/05/2015   Procedure: TRANSESOPHAGEAL ECHOCARDIOGRAM (TEE);  Surgeon: [name], MD;  Location: [place];  Service: Open Heart Surgery;  Laterality: N/A;  . TOTAL HIP ARTHROPLASTY Left 01/09/2014   DR Ninfa Linden  . TOTAL HIP ARTHROPLASTY Left 01/09/2014   Procedure: LEFT TOTAL HIP ARTHROPLASTY ANTERIOR APPROACH and Steroid Injection Right hip;  Surgeon: Mcarthur Rossetti, MD;  Location: [place];  Service: Orthopedics;  Laterality: Left;    Current Medications: Current Meds  Medication Sig  . aspirin 81 MG chewable tablet Chew 81 mg by mouth daily.  . carvedilol (COREG) 3.125 MG tablet Take 3.125 mg by mouth daily.  Marland Kitchen ezetimibe (ZETIA) 10 MG tablet Take 10 mg by mouth daily.  Marland Kitchen HYDROcodone-acetaminophen (NORCO/VICODIN) 5-325 MG tablet Take 1 tablet by mouth every 6 (six) hours as  needed for moderate pain.  . methocarbamol (ROBAXIN) 500 MG tablet Take 1 tablet (500 mg total) by mouth every 8 (eight) hours as needed for muscle spasms.  . nitroGLYCERIN (NITROSTAT) 0.4 MG SL tablet Place 1 tablet (0.4 mg total) under the tongue every 5 (five) minutes as needed for chest pain.  Marland Kitchen RAPAFLO 8 MG CAPS capsule Take 8 mg by mouth daily.     Allergies:   Codeine and Zetia [ezetimibe]   Social History   Social History  . Marital status: Married    Spouse name: N/A  . Number of children: N/A  . Years of education: N/A   Social History Main Topics  . Smoking status: Former Smoker    Types: Cigarettes    Quit date: 02/04/1987  . Smokeless tobacco: Former Systems developer  Types: Sarina Ser    Quit date: 07/20/2009     Comment: quit 35+yrs ago  . Alcohol use No  . Drug use: No  . Sexual activity: Not Currently   Other Topics Concern  . None   Social History Narrative  . None     Family History:  The patient's family history includes Diabetes in his son; Heart attack in his father and sister; Heart disease in his father and sister; Hypertension in his mother and sister.   ROS:   Please see the history of present illness.    Review of Systems  Constitution: Positive for decreased appetite and malaise/fatigue.  Cardiovascular: Positive for chest pain and dyspnea on exertion.  Respiratory: Positive for shortness of breath.   Hematologic/Lymphatic: Bruises/bleeds easily.  Musculoskeletal: Positive for myalgias.  Gastrointestinal: Positive for constipation.  Neurological: Positive for dizziness.   All other systems reviewed and are negative.   EKGs/Labs/Other Test Reviewed:    EKG:  EKG is  ordered today.  The ekg ordered today demonstrates NSR, HR 78, PACs, normal axis, QTC 437 ms, no change from prior tracing  Recent Labs: 10/19/2016: BUN 11; Creatinine, Ser 1.07; Hemoglobin 15.2; Platelets 248; Potassium 4.2; Sodium 140 01/20/2017: ALT 9   Recent Lipid Panel    Component  Value Date/Time   CHOL 207 (H) 01/20/2017 1051   TRIG 121 01/20/2017 1051   HDL 45 01/20/2017 1051   CHOLHDL 4.6 01/20/2017 1051   CHOLHDL 3.8 06/08/2016 0901   VLDL 15 06/08/2016 0901   LDLCALC 138 (H) 01/20/2017 1051    Physical Exam:    VS:  BP 120/70   Pulse 84   Ht 5\' 6"  (1.676 m)   Wt 175 lb (79.4 kg)   SpO2 97%   BMI 28.25 kg/m     Wt Readings from Last 3 Encounters:  01/20/17 175 lb (79.4 kg)  11/27/16 175 lb (79.4 kg)  10/19/16 173 lb (78.5 kg)     Physical Exam  Constitutional: He is oriented to person, place, and time. He appears well-developed and well-nourished.  HENT:  Head: Normocephalic and atraumatic.  Eyes: No scleral icterus.  Neck: No JVD present.  Cardiovascular: Normal rate, regular rhythm and normal heart sounds.   No murmur heard. Pulmonary/Chest: Effort normal. He has no wheezes. He has no rales.  Abdominal: Soft. He exhibits no mass. There is no tenderness.  Musculoskeletal: He exhibits no edema.  Neurological: He is alert and oriented to person, place, and time.  Skin: Skin is warm and dry.  Psychiatric: He has a normal mood and affect.    ASSESSMENT:    1. Right-sided back pain, unspecified back location, unspecified chronicity   2. Coronary artery disease involving native coronary artery of native heart without angina pectoris   3. Bilateral carotid artery disease ([place])   4. Essential hypertension   5. Pure hypercholesterolemia    PLAN:    In order of problems listed above:  1. Back pain - The patient presents with complaints of right scapular and right flank pain. He had similar symptoms prior to his bypass. However, at that time, he had chest pain and shortness of breath. He denies further chest symptoms or shortness of breath. He does note that his right scapular pain resolved after his bypass surgery. He then underwent cholecystectomy. He had right scapular pains prior to this surgery. Those symptoms resolved for about 2 weeks  after his surgery. Since that time, he's had recurrent symptoms. It sounds as though  he may be describing biliary colic. Since he had some symptoms prior to his bypass, I cannot completely rule out progressive CAD and ischemia. His ECG is unchanged. I suggested a stress echocardiogram. However, he cannot walk on a treadmill.  -  Refer to gastroenterology for further evaluation of possible retained gallstone  -  Arrange Lexiscan Myoview  2. CAD - s/p prior MIs and subsequent CABG in 2016.  I am uncertain if his current symptoms are anginal. As noted, he will be referred to gastroenterology. I will arrange Lexiscan Myoview. Continue aspirin, blocker. He is intolerant of statins.  3. Carotid artery disease - s/p L CEA.  Followed by [facility].    4. HTN - BP controlled.   5. HL - Intol of statins.  Now on Zetia.  Will get lipids today.  If LDL > 70, will refer to Lipid clinic (?PCSK9i or [facility] trial).  Medication Adjustments/Labs and Tests Ordered: Current medicines are reviewed at length with the patient today.  Concerns regarding medicines are outlined above.  Medication changes, Labs and Tests ordered today are outlined in the Patient Instructions noted below. Patient Instructions  Medication Instructions:  Your physician recommends that you continue on your current medications as directed. Please refer to the Current Medication list given to you today.  Labwork: TODAY LIPID AND LIVER PANEL  Testing/Procedures: 1. Your physician has requested that you have a lexiscan myoview. For further information please visit [email or web address]. Please follow instruction sheet, as given.  Follow-Up: 1. Juniper Cobey, PAC IN 1 MONTH SAME DAY DR. Acie Fredrickson IS INI THE OFFICE 2. YOU ARE BEING REFERRED TO [facility] ; DX BACK PAIN, S/P CHOLECYSTECTOMY  Any Other Special Instructions Will Be Listed Below (If Applicable).  If you need a refill on your cardiac medications before your next appointment,  please call your pharmacy.  Signed, [name], PA-C  01/20/2017 5:52 PM    [facility] [address], [place], [place]  [address] Phone: [phone]; Fax: [phone]

## 2017-01-27 ENCOUNTER — Encounter (HOSPITAL_COMMUNITY): Payer: Medicare Other

## 2017-02-10 ENCOUNTER — Telehealth: Payer: Self-pay | Admitting: Pharmacist

## 2017-02-10 ENCOUNTER — Ambulatory Visit (INDEPENDENT_AMBULATORY_CARE_PROVIDER_SITE_OTHER): Payer: Medicare Other | Admitting: Pharmacist

## 2017-02-10 DIAGNOSIS — I2510 Atherosclerotic heart disease of native coronary artery without angina pectoris: Secondary | ICD-10-CM

## 2017-02-10 DIAGNOSIS — E7800 Pure hypercholesterolemia, unspecified: Secondary | ICD-10-CM | POA: Diagnosis not present

## 2017-02-10 MED ORDER — EVOLOCUMAB 140 MG/ML ~~LOC~~ SOAJ: 1.0000 "pen " | SUBCUTANEOUS | 11 refills | Status: DC

## 2017-02-10 NOTE — Patient Instructions (Signed)
It was nice to meet you today  I will start the paperwork for Repatha injections for your cholesterol  If they are too expensive, we can talk about clinical trials at [facility]

## 2017-02-10 NOTE — Telephone Encounter (Signed)
Repatha approved by [employer] for 6 months. Rx sent to [facility] [facility].

## 2017-02-10 NOTE — Progress Notes (Signed)
Patient ID: Ryan [name]                 DOB: 07/02/1941                    MRN: [id number]     HPI: Ryan [name] is a 76 y.o. male patient of Dr Acie Fredrickson referred to lipid clinic by [name], PA. PMH is significant for CAD s/p prior MIs and CABG, HTN, carotid stenosis, PAD, and hypothyroidism. Pt has previously been seen in lipid clinic.   Patient reports that he was able to tolerate Crestor for about a year and Zetia for around 6 months. He was on both Crestor 10 mg daily and Zetia 10 mg daily for around 6 months with some diffuse muscle aches.  His Crestor was increased from 10 mg daily to 20 mg daily in January 2017.  In March 2017, Dr. Acie Fredrickson stopped the Crestor due to diffuse muscle aches and he was continued on Zetia. However in March he did have one episode of muscle pain in which he was unable to walk on Zetia monotherapy. He is currently taking Zetia 10mg  daily and tolerating it ok. He does complain of shoulder blade and rib pain - has follow up with his MD next week. Reports his GI doctor stopped all of his medications for a month, so his most recent lipid panel reflects an LDL while he was on Zetia for 3 weeks.  Current Medications: Zetia 10mg  daily Intolerances: rosuvastatin 5mg  3x per week, 10 mg, 20 mg, 40 mg daily  (muscle aches), pravastatin 20 mg daily (constipation), atorvastatin 40 mg daily (unknown reaction), welchol 1250 mg BID (unknown reason for stopping) Risk Factors: CAD s/p MI and CABG, PAD LDL goal: <70 mg/dL  Diet:  Breakfast - toast or tenderloin biscuit (every 2-3 weeks) Lunch - pack of crackers Dinner - steak or chicken with potatoes or salads Snacks - occasional sweets/candy  Drinks - water, occasional soda  Exercise: Did buy a stationary bike but stopped due muscle pain  Family History: Family history includes Diabetes in his son; Heart attack in his father and sister; Heart disease in his father and sister; Hypertension in his mother and  sister.  Social History:  Reports that he quit smoking about 29 years ago. His smoking use included Cigarettes. He has quit using smokeless tobacco. His smokeless tobacco use included Chew. He reports that he does not drink alcohol or use illicit drugs.  Labs: 01/20/17: TC 207, TG 121, HDL 45, LDL 138 (on ezetimibe 10mg  daily - had been on it for 3 weeks) 12/26/15: TC 145, TG 77, HDL 39, LDL 91, LFTs wnl (on rosuvastatin 10 mg daily and ezetimibe 10 mg daily)  Past Medical History:  Diagnosis Date  . Arthritis   . CAD (coronary artery disease)   . Carotid artery occlusion   . Cataract    Bil eyes/worse in left eye  . CHF (congestive heart failure) ([place])   . Chronic back pain   . DVT (deep venous thrombosis) ([place])   . Dysrhythmia   . Enlarged prostate    takes Rapaflo daily  . GERD (gastroesophageal reflux disease)    occasional  . History of colon polyps   . History of gout    has colchicine prn  . History of kidney stones   . Hyperlipidemia    takes Crestor daily  . Hypertension    takes Amlodipine daily  . Hypothyroidism   . Myocardial  infarction   . Peripheral vascular disease ([place])   . Pulmonary emboli ([place]) 03/20/2015   elevated d-dimer, intermediate V/Q study, atypical chest pain and SOB. Start on Xarelto 20mg  BID for 3 month  . Renal insufficiency   . Shortness of breath dyspnea   . Urinary frequency   . Urinary urgency     Current Outpatient Prescriptions on File Prior to Visit  Medication Sig Dispense Refill  . aspirin 81 MG chewable tablet Chew 81 mg by mouth daily.    . carvedilol (COREG) 3.125 MG tablet Take 3.125 mg by mouth daily.    Marland Kitchen ezetimibe (ZETIA) 10 MG tablet Take 10 mg by mouth daily.    Marland Kitchen HYDROcodone-acetaminophen (NORCO/VICODIN) 5-325 MG tablet Take 1 tablet by mouth every 6 (six) hours as needed for moderate pain. 20 tablet 0  . methocarbamol (ROBAXIN) 500 MG tablet Take 1 tablet (500 mg total) by mouth every 8 (eight) hours as needed for muscle  spasms. 10 tablet 0  . nitroGLYCERIN (NITROSTAT) 0.4 MG SL tablet Place 1 tablet (0.4 mg total) under the tongue every 5 (five) minutes as needed for chest pain. 25 tablet 6  . RAPAFLO 8 MG CAPS capsule Take 8 mg by mouth daily.     No current facility-administered medications on file prior to visit.     Allergies  Allergen Reactions  . Codeine Nausea And Vomiting       . Zetia [Ezetimibe] Other (See Comments)    Myalgias     Assessment/Plan:  1. Hyperlipidemia - LDL 138 on Zetia 10mg  daily above goal 70mg /dL given history of CAD s/p MI and CABG. Pt is intolerant to 3 statins including Crestor 5mg  3x per week. Discussed clinical trials vs PCSK9i. Pt would like to pursue PCSK9i first. Discussed that his copay will likely be expensive since he has [employer]. Household income is too high to qualify for patient assistance. Will start prior authorization for Repatha. If copay is cost prohibitive, will revisit clinical trials. Pt looks like a good candidate for CLEAR.   Nanci Lakatos E. Namrata Dangler, PharmD, CPP, [place] [id number] [address], [place], [place] [address] Phone: [phone]; Fax: [phone] 02/10/2017 10:24 AM

## 2017-02-15 NOTE — Telephone Encounter (Signed)
Repatha copay will be $400 per month. Called pt to inform him of this - he does not want it filled. Mentioned CLEAR trial to pt - he stated that he has 2 MD appts in the next week or so and wants to wait until after them to decide. Will pt next week to see if he is interested.

## 2017-02-16 DIAGNOSIS — I498 Other specified cardiac arrhythmias: Secondary | ICD-10-CM | POA: Diagnosis not present

## 2017-02-16 DIAGNOSIS — M898X1 Other specified disorders of bone, shoulder: Secondary | ICD-10-CM | POA: Diagnosis not present

## 2017-02-16 DIAGNOSIS — M5489 Other dorsalgia: Secondary | ICD-10-CM | POA: Diagnosis not present

## 2017-02-16 DIAGNOSIS — Z6829 Body mass index (BMI) 29.0-29.9, adult: Secondary | ICD-10-CM | POA: Diagnosis not present

## 2017-02-17 ENCOUNTER — Encounter: Payer: Self-pay | Admitting: Gastroenterology

## 2017-02-17 ENCOUNTER — Ambulatory Visit (INDEPENDENT_AMBULATORY_CARE_PROVIDER_SITE_OTHER): Payer: Medicare Other | Admitting: Gastroenterology

## 2017-02-17 VITALS — BP 160/86 | HR 73 | Ht 66.0 in | Wt 174.0 lb

## 2017-02-17 DIAGNOSIS — R1011 Right upper quadrant pain: Secondary | ICD-10-CM

## 2017-02-17 DIAGNOSIS — K5903 Drug induced constipation: Secondary | ICD-10-CM | POA: Diagnosis not present

## 2017-02-17 DIAGNOSIS — I2510 Atherosclerotic heart disease of native coronary artery without angina pectoris: Secondary | ICD-10-CM | POA: Diagnosis not present

## 2017-02-17 DIAGNOSIS — T402X5A Adverse effect of other opioids, initial encounter: Secondary | ICD-10-CM

## 2017-02-17 MED ORDER — NALOXEGOL OXALATE 25 MG PO TABS: 25.0000 mg | ORAL_TABLET | Freq: Every day | ORAL | 0 refills | Status: DC

## 2017-02-17 NOTE — Patient Instructions (Signed)
We have given you Movantik samples today , take one daily if its to strong break tablet in half and try that. If this medication works for you call us back and we will send in a prescription for you  Use Miralax 1/2 capful daily  Call Urologist if you continue to have urinary pain   Call in 2 weeks if no improvements with constipation   Follow up as needed

## 2017-02-17 NOTE — Progress Notes (Signed)
Ryan [name]    [id number]    Apr 13, 1941  Primary Care Physician:Shaw, Gwyndolyn Saxon, MD  Referring Physician: Marton Redwood, MD [place], [place] [address]  Chief complaint: R side back pain  HPI: 76 year old male here for new patient visit with complaint of right side back pain and R shoulder pain for past 1 year. He had similar complaints prior to cholecystectomy for chronic cholecystitis in August 2017. Pain sometimes is worse with change in posture or activity. No relationship with diet. Patient feels the pain improves after he has bowel movements. He is constipated and having irregular bowel movements, was recommended by [name] to start taking MiraLAX daily. He is already taking stool softener with no improvement. He is on low-dose narcotics for pain control.  Denies any nausea, vomiting, melena or bright red blood per rectum  CT abdomen in October 2017 showed finding of a 4 mm right upper pole renal calculus   Outpatient Encounter Prescriptions as of 02/17/2017  Medication Sig  . aspirin 81 MG chewable tablet Chew 81 mg by mouth daily.  . carvedilol (COREG) 3.125 MG tablet Take 3.125 mg by mouth daily.  Marland Kitchen docusate sodium (COLACE) 100 MG capsule Take 100 mg by mouth 2 (two) times daily.  Marland Kitchen ezetimibe (ZETIA) 10 MG tablet Take 10 mg by mouth daily.  Marland Kitchen HYDROcodone-acetaminophen (NORCO/VICODIN) 5-325 MG tablet Take 1 tablet by mouth every 6 (six) hours as needed for moderate pain.  . methocarbamol (ROBAXIN) 500 MG tablet Take 1 tablet (500 mg total) by mouth every 8 (eight) hours as needed for muscle spasms.  . nitroGLYCERIN (NITROSTAT) 0.4 MG SL tablet Place 1 tablet (0.4 mg total) under the tongue every 5 (five) minutes as needed for chest pain.  Marland Kitchen RAPAFLO 8 MG CAPS capsule Take 8 mg by mouth daily.  . [DISCONTINUED] Evolocumab (REPATHA SURECLICK) [id number] MG/ML SOAJ Inject 1 pen into the skin every 14 (fourteen) days.   No facility-administered encounter  medications on file as of 02/17/2017.     Allergies as of 02/17/2017 - Review Complete 02/17/2017  Allergen Reaction Noted  . Codeine Nausea And Vomiting 07/24/2008  . Zetia [ezetimibe] Other (See Comments) 04/07/2016    Past Medical History:  Diagnosis Date  . Arthritis   . CAD (coronary artery disease)   . Carotid artery occlusion   . Cataract    Bil eyes/worse in left eye  . CHF (congestive heart failure) ([place])   . Chronic back pain   . DVT (deep venous thrombosis) ([place])   . Dysrhythmia   . Enlarged prostate    takes Rapaflo daily  . GERD (gastroesophageal reflux disease)    occasional  . History of colon polyps   . History of gout    has colchicine prn  . History of kidney stones   . Hyperlipidemia    takes Crestor daily  . Hypertension    takes Amlodipine daily  . Hypothyroidism   . Myocardial infarction   . Peripheral vascular disease ([place])   . Pulmonary emboli ([place]) 03/20/2015   elevated d-dimer, intermediate V/Q study, atypical chest pain and SOB. Start on Xarelto 20mg  BID for 3 month  . Renal insufficiency   . Shortness of breath dyspnea   . Urinary frequency   . Urinary urgency     Past Surgical History:  Procedure Laterality Date  . APPENDECTOMY    . BACK SURGERY     5 times  .  big toe surgery    . CARDIAC CATHETERIZATION     2010    dr Acie Fredrickson  . cataract surgery     left eye  . CHOLECYSTECTOMY N/A 07/27/2016   Procedure: LAPAROSCOPIC CHOLECYSTECTOMY;  Surgeon: Mickeal Skinner, MD;  Location: [facility];  Service: General;  Laterality: N/A;  . COLONOSCOPY    . CORONARY ARTERY BYPASS GRAFT N/A 04/05/2015   Procedure: CORONARY ARTERY BYPASS GRAFTING (CABG)X4 LIMA-LAD; SVG-DIAG1-DIAG2; SVG-PD;  Surgeon: [name], MD;  Location: [facility];  Service: Open Heart Surgery;  Laterality: N/A;  . CYSTOSCOPY    . ENDARTERECTOMY Left 04/24/2016   Procedure: ENDARTERECTOMY LEFT CAROTID;  Surgeon: Mal Misty, MD;  Location: [facility];  Service: Vascular;   Laterality: Left;  . EYE SURGERY    . FEMORAL ARTERY - POPLITEAL ARTERY BYPASS GRAFT    . JOINT REPLACEMENT     shoulder  . LEFT HEART CATHETERIZATION WITH CORONARY ANGIOGRAM N/A 04/03/2015   Procedure: LEFT HEART CATHETERIZATION WITH CORONARY ANGIOGRAM;  Surgeon: Troy Sine, MD;  [facility] C[facility] LAB;  Service: Cardiovascular;  Laterality: N/A;  . LUMBAR LAMINECTOMY  01/06/2013   Procedure: MICRODISCECTOMY LUMBAR LAMINECTOMY;  Surgeon: Marybelle Killings, MD;  Location: [facility];  Service: Orthopedics;  Laterality: N/A;  L3-4 decompression  . LUMBAR LAMINECTOMY/DECOMPRESSION MICRODISCECTOMY  02/12/2012   Procedure: LUMBAR LAMINECTOMY/DECOMPRESSION MICRODISCECTOMY;  Surgeon: Floyce Stakes, MD;  Location: [facility];  Service: Neurosurgery;  Laterality: N/A;  Lumbar four-five laminectomy  . PATCH ANGIOPLASTY Left 04/24/2016   Procedure: LEFT CAROTID ARTERY PATCH ANGIOPLASTY;  Surgeon: Mal Misty, MD;  Location: [facility];  Service: Vascular;  Laterality: Left;  . STERIOD INJECTION Right 01/09/2014   Procedure: STEROID INJECTION;  Surgeon: Mcarthur Rossetti, MD;  Location: [facility];  Service: Orthopedics;  Laterality: Right;  . TEE WITHOUT CARDIOVERSION N/A 04/05/2015   Procedure: TRANSESOPHAGEAL ECHOCARDIOGRAM (TEE);  Surgeon: [name], MD;  Location: [facility];  Service: Open Heart Surgery;  Laterality: N/A;  . TOTAL HIP ARTHROPLASTY Left 01/09/2014   DR Ninfa Linden  . TOTAL HIP ARTHROPLASTY Left 01/09/2014   Procedure: LEFT TOTAL HIP ARTHROPLASTY ANTERIOR APPROACH and Steroid Injection Right hip;  Surgeon: Mcarthur Rossetti, MD;  Location: [facility];  Service: Orthopedics;  Laterality: Left;    Family History  Problem Relation Age of Onset  . Heart disease Father   . Heart attack Father   . Heart disease Sister   . Hypertension Sister   . Heart attack Sister   . Hypertension Mother   . Diabetes Son     Social History   Social History  . Marital status: Married    Spouse name:  N/A  . Number of children: N/A  . Years of education: N/A   Occupational History  . Not on file.   Social History Main Topics  . Smoking status: Former Smoker    Types: Cigarettes    Quit date: 02/04/1987  . Smokeless tobacco: Former Systems developer    Types: [place] date: 07/20/2009     Comment: quit 35+yrs ago  . Alcohol use No  . Drug use: No  . Sexual activity: Not Currently   Other Topics Concern  . Not on file   Social History Narrative  . No narrative on file      Review of systems: Review of Systems  Constitutional: Negative for fever and chills.  HENT: Negative.   Eyes: Negative for blurred vision.  Respiratory:  Negative for cough, shortness of breath and wheezing.   Cardiovascular: Negative for chest pain and palpitations.  Gastrointestinal: as per HPI Genitourinary: Negative for dysuria, urgency, frequency and hematuria.  Musculoskeletal: Positive for myalgias, back pain and joint pain.  Skin: Negative for itching and rash.  Neurological: Negative for dizziness, tremors, focal weakness, seizures and loss of consciousness.  Endo/Heme/Allergies: Positive for seasonal allergies.  Psychiatric/Behavioral: Negative for depression, suicidal ideas and hallucinations.  All other systems reviewed and are negative.   Physical Exam: Vitals:   02/17/17 0837  BP: (!) 160/86  Pulse: 73   Body mass index is 28.08 kg/m. Gen:      No acute distress HEENT:  EOMI, sclera anicteric Neck:     No masses; no thyromegaly Lungs:    Clear to auscultation bilaterally; normal respiratory effort CV:         Regular rate and rhythm; no murmurs Abd:      + bowel sounds; soft, non-tender; no palpable masses, no distension Ext:    No edema; adequate peripheral perfusion Skin:      Warm and dry; no rash Neuro: alert and oriented x 3 Psych: normal mood and affect  Data Reviewed:  Reviewed labs, radiology imaging, old records and pertinent past GI work up   Assessment and  Plan/Recommendations:  76 year old male with complaints of right upper quadrant pain radiating to the right shoulder and right back Based on history right upper quadrant pain appears to be more musculoskeletal nature, but can also be secondary to renal calculus Follow up with Urology No LFT abnormality and CBD within normal limits  Constipation: worse with opiates Start Movantik 25mg  daily Continue Miralax 1/2 capful daily  Advised patient to contact office in 2 weeks if continues to have persistent RUQ pain and constipation  Greater than 50% of the time used for counseling as well as treatment plan and follow-up. He had multiple questions which were answered to his satisfaction  K. Denzil Magnuson , MD [phone] Mon-Fri 8a-5p [phone] after 5p, weekends, holidays  CC: Marton Redwood, MD

## 2017-02-19 ENCOUNTER — Ambulatory Visit: Payer: Medicare Other | Admitting: Physician Assistant

## 2017-02-23 ENCOUNTER — Encounter: Payer: Self-pay | Admitting: Physician Assistant

## 2017-02-26 ENCOUNTER — Inpatient Hospital Stay (HOSPITAL_COMMUNITY)
Admission: EM | Admit: 2017-02-26 | Discharge: 2017-03-01 | DRG: 292 | Disposition: A | Discharge status: Home / Self Care | Payer: Medicare Other | Attending: Internal Medicine | Admitting: Internal Medicine

## 2017-02-26 ENCOUNTER — Encounter (HOSPITAL_COMMUNITY): Payer: Self-pay

## 2017-02-26 ENCOUNTER — Emergency Department (HOSPITAL_COMMUNITY): Payer: Medicare Other

## 2017-02-26 ENCOUNTER — Inpatient Hospital Stay (HOSPITAL_COMMUNITY): Payer: Medicare Other

## 2017-02-26 ENCOUNTER — Telehealth: Payer: Self-pay | Admitting: Cardiovascular Disease

## 2017-02-26 DIAGNOSIS — I5031 Acute diastolic (congestive) heart failure: Secondary | ICD-10-CM | POA: Diagnosis not present

## 2017-02-26 DIAGNOSIS — I350 Nonrheumatic aortic (valve) stenosis: Secondary | ICD-10-CM | POA: Diagnosis present

## 2017-02-26 DIAGNOSIS — I10 Essential (primary) hypertension: Secondary | ICD-10-CM | POA: Diagnosis present

## 2017-02-26 DIAGNOSIS — G8929 Other chronic pain: Secondary | ICD-10-CM | POA: Insufficient documentation

## 2017-02-26 DIAGNOSIS — E038 Other specified hypothyroidism: Secondary | ICD-10-CM

## 2017-02-26 DIAGNOSIS — I5032 Chronic diastolic (congestive) heart failure: Secondary | ICD-10-CM | POA: Insufficient documentation

## 2017-02-26 DIAGNOSIS — Z9049 Acquired absence of other specified parts of digestive tract: Secondary | ICD-10-CM

## 2017-02-26 DIAGNOSIS — Z86010 Personal history of colonic polyps: Secondary | ICD-10-CM | POA: Diagnosis not present

## 2017-02-26 DIAGNOSIS — M25531 Pain in right wrist: Secondary | ICD-10-CM | POA: Diagnosis not present

## 2017-02-26 DIAGNOSIS — Z87442 Personal history of urinary calculi: Secondary | ICD-10-CM

## 2017-02-26 DIAGNOSIS — I4891 Unspecified atrial fibrillation: Secondary | ICD-10-CM | POA: Diagnosis not present

## 2017-02-26 DIAGNOSIS — M898X1 Other specified disorders of bone, shoulder: Secondary | ICD-10-CM

## 2017-02-26 DIAGNOSIS — I110 Hypertensive heart disease with heart failure: Principal | ICD-10-CM | POA: Diagnosis present

## 2017-02-26 DIAGNOSIS — Z86711 Personal history of pulmonary embolism: Secondary | ICD-10-CM | POA: Diagnosis not present

## 2017-02-26 DIAGNOSIS — N179 Acute kidney failure, unspecified: Secondary | ICD-10-CM | POA: Diagnosis not present

## 2017-02-26 DIAGNOSIS — E785 Hyperlipidemia, unspecified: Secondary | ICD-10-CM | POA: Diagnosis present

## 2017-02-26 DIAGNOSIS — E039 Hypothyroidism, unspecified: Secondary | ICD-10-CM

## 2017-02-26 DIAGNOSIS — I480 Paroxysmal atrial fibrillation: Secondary | ICD-10-CM | POA: Diagnosis present

## 2017-02-26 DIAGNOSIS — R338 Other retention of urine: Secondary | ICD-10-CM | POA: Diagnosis present

## 2017-02-26 DIAGNOSIS — N401 Enlarged prostate with lower urinary tract symptoms: Secondary | ICD-10-CM | POA: Diagnosis present

## 2017-02-26 DIAGNOSIS — R079 Chest pain, unspecified: Secondary | ICD-10-CM | POA: Diagnosis not present

## 2017-02-26 DIAGNOSIS — Z96642 Presence of left artificial hip joint: Secondary | ICD-10-CM | POA: Diagnosis present

## 2017-02-26 DIAGNOSIS — I5021 Acute systolic (congestive) heart failure: Secondary | ICD-10-CM

## 2017-02-26 DIAGNOSIS — I5043 Acute on chronic combined systolic (congestive) and diastolic (congestive) heart failure: Secondary | ICD-10-CM | POA: Diagnosis present

## 2017-02-26 DIAGNOSIS — Z87891 Personal history of nicotine dependence: Secondary | ICD-10-CM | POA: Diagnosis not present

## 2017-02-26 DIAGNOSIS — Z79899 Other long term (current) drug therapy: Secondary | ICD-10-CM

## 2017-02-26 DIAGNOSIS — Z7982 Long term (current) use of aspirin: Secondary | ICD-10-CM | POA: Diagnosis not present

## 2017-02-26 DIAGNOSIS — M25511 Pain in right shoulder: Secondary | ICD-10-CM | POA: Diagnosis present

## 2017-02-26 DIAGNOSIS — R911 Solitary pulmonary nodule: Secondary | ICD-10-CM | POA: Diagnosis present

## 2017-02-26 DIAGNOSIS — R0602 Shortness of breath: Secondary | ICD-10-CM | POA: Diagnosis not present

## 2017-02-26 DIAGNOSIS — M109 Gout, unspecified: Secondary | ICD-10-CM | POA: Diagnosis not present

## 2017-02-26 DIAGNOSIS — R0600 Dyspnea, unspecified: Secondary | ICD-10-CM | POA: Diagnosis not present

## 2017-02-26 DIAGNOSIS — I252 Old myocardial infarction: Secondary | ICD-10-CM

## 2017-02-26 DIAGNOSIS — I5042 Chronic combined systolic (congestive) and diastolic (congestive) heart failure: Secondary | ICD-10-CM | POA: Insufficient documentation

## 2017-02-26 DIAGNOSIS — I739 Peripheral vascular disease, unspecified: Secondary | ICD-10-CM | POA: Diagnosis present

## 2017-02-26 DIAGNOSIS — T502X5A Adverse effect of carbonic-anhydrase inhibitors, benzothiadiazides and other diuretics, initial encounter: Secondary | ICD-10-CM | POA: Diagnosis not present

## 2017-02-26 DIAGNOSIS — R0609 Other forms of dyspnea: Secondary | ICD-10-CM | POA: Diagnosis not present

## 2017-02-26 DIAGNOSIS — I2510 Atherosclerotic heart disease of native coronary artery without angina pectoris: Secondary | ICD-10-CM | POA: Diagnosis not present

## 2017-02-26 DIAGNOSIS — R071 Chest pain on breathing: Secondary | ICD-10-CM | POA: Diagnosis not present

## 2017-02-26 DIAGNOSIS — E7800 Pure hypercholesterolemia, unspecified: Secondary | ICD-10-CM | POA: Diagnosis not present

## 2017-02-26 DIAGNOSIS — Z951 Presence of aortocoronary bypass graft: Secondary | ICD-10-CM | POA: Diagnosis not present

## 2017-02-26 DIAGNOSIS — I509 Heart failure, unspecified: Secondary | ICD-10-CM | POA: Diagnosis not present

## 2017-02-26 DIAGNOSIS — Z86718 Personal history of other venous thrombosis and embolism: Secondary | ICD-10-CM

## 2017-02-26 DIAGNOSIS — I200 Unstable angina: Secondary | ICD-10-CM | POA: Diagnosis not present

## 2017-02-26 DIAGNOSIS — I5023 Acute on chronic systolic (congestive) heart failure: Secondary | ICD-10-CM

## 2017-02-26 DIAGNOSIS — I25110 Atherosclerotic heart disease of native coronary artery with unstable angina pectoris: Secondary | ICD-10-CM | POA: Diagnosis present

## 2017-02-26 LAB — BASIC METABOLIC PANEL
Anion gap: 10 (ref 5–15)
BUN: 10 mg/dL (ref 6–20)
CO2: 22 mmol/L (ref 22–32)
Calcium: 9.1 mg/dL (ref 8.9–10.3)
Chloride: 106 mmol/L (ref 101–111)
Creatinine, Ser: 1 mg/dL (ref 0.61–1.24)
GFR calc Af Amer: >60 mL/min (ref >60)
GFR calc non Af Amer: >60 mL/min (ref >60)
Glucose, Bld: 115 mg/dL — ABNORMAL HIGH (ref 65–99)
Potassium: 3.9 mmol/L (ref 3.5–5.1)
SODIUM: 138 mmol/L (ref 135–145)

## 2017-02-26 LAB — CBC
HCT: 47.3 % (ref 39.0–52.0)
HEMATOCRIT: 47.2 % (ref 39.0–52.0)
HEMOGLOBIN: 15.8 g/dL (ref 13.0–17.0)
Hemoglobin: 15.9 g/dL (ref 13.0–17.0)
MCH: 31.5 pg (ref 26.0–34.0)
MCH: 31.3 pg (ref 26.0–34.0)
MCHC: 33.6 g/dL (ref 30.0–36.0)
MCHC: 33.5 g/dL (ref 30.0–36.0)
MCV: 93.8 fL (ref 78.0–100.0)
MCV: 93.5 fL (ref 78.0–100.0)
PLATELETS: 244 10*3/uL (ref 150–400)
Platelets: 265 10*3/uL (ref 150–400)
RBC: 5.04 MIL/uL (ref 4.22–5.81)
RBC: 5.05 MIL/uL (ref 4.22–5.81)
RDW: 13.4 % (ref 11.5–15.5)
RDW: 13.1 % (ref 11.5–15.5)
WBC: 10.8 10*3/uL — ABNORMAL HIGH (ref 4.0–10.5)
WBC: 9.9 10*3/uL (ref 4.0–10.5)

## 2017-02-26 LAB — CREATININE, SERUM
Creatinine, Ser: 1.06 mg/dL (ref 0.61–1.24)
GFR calc Af Amer: >60 mL/min (ref >60)
GFR calc non Af Amer: >60 mL/min (ref >60)

## 2017-02-26 LAB — TROPONIN I
TROPONIN I: 0.07 ng/mL — AB (ref <0.03)
TROPONIN I: 0.08 ng/mL — AB (ref <0.03)

## 2017-02-26 LAB — TSH: TSH: 11.473 u[IU]/mL — ABNORMAL HIGH (ref 0.350–4.500)

## 2017-02-26 LAB — I-STAT TROPONIN, ED: TROPONIN I, POC: 0.06 ng/mL (ref 0.00–0.08)

## 2017-02-26 LAB — MAGNESIUM: Magnesium: 2 mg/dL (ref 1.7–2.4)

## 2017-02-26 LAB — BRAIN NATRIURETIC PEPTIDE: B Natriuretic Peptide: 535.5 pg/mL — ABNORMAL HIGH (ref 0.0–100.0)

## 2017-02-26 MED ORDER — TAMSULOSIN HCL 0.4 MG PO CAPS
0.4000 mg | ORAL_CAPSULE | Freq: Every day | ORAL | Status: DC
  Administered 2017-02-27: 0.4 mg via ORAL
  Filled 2017-02-26: qty 1

## 2017-02-26 MED ORDER — FUROSEMIDE 10 MG/ML IJ SOLN
40.0000 mg | Freq: Two times a day (BID) | INTRAMUSCULAR | Status: DC
  Administered 2017-02-26 – 2017-02-28 (×4): 40 mg via INTRAVENOUS
  Filled 2017-02-26 (×4): qty 4

## 2017-02-26 MED ORDER — DOCUSATE SODIUM 100 MG PO CAPS
100.0000 mg | ORAL_CAPSULE | Freq: Two times a day (BID) | ORAL | Status: DC
  Administered 2017-02-26 – 2017-03-01 (×4): 100 mg via ORAL
  Filled 2017-02-26 (×5): qty 1

## 2017-02-26 MED ORDER — ACETAMINOPHEN 325 MG PO TABS: 650.0000 mg | ORAL_TABLET | Freq: Four times a day (QID) | ORAL | Status: DC | PRN

## 2017-02-26 MED ORDER — ENOXAPARIN SODIUM 40 MG/0.4ML ~~LOC~~ SOLN
40.0000 mg | SUBCUTANEOUS | Status: DC
Start: 2017-02-26 — End: 2017-02-26

## 2017-02-26 MED ORDER — NITROGLYCERIN 0.4 MG SL SUBL
0.4000 mg | SUBLINGUAL_TABLET | SUBLINGUAL | Status: DC | PRN
Start: 2017-02-26 — End: 2017-03-01

## 2017-02-26 MED ORDER — EZETIMIBE 10 MG PO TABS
10.0000 mg | ORAL_TABLET | Freq: Every day | ORAL | Status: DC
  Administered 2017-02-26 – 2017-03-01 (×3): 10 mg via ORAL
  Filled 2017-02-26 (×3): qty 1

## 2017-02-26 MED ORDER — ONDANSETRON HCL 4 MG PO TABS: 4.0000 mg | ORAL_TABLET | Freq: Four times a day (QID) | ORAL | Status: DC | PRN

## 2017-02-26 MED ORDER — ACETAMINOPHEN 650 MG RE SUPP
650.0000 mg | Freq: Four times a day (QID) | RECTAL | Status: DC | PRN
Start: 2017-02-26 — End: 2017-03-01

## 2017-02-26 MED ORDER — IOPAMIDOL (ISOVUE-370) INJECTION 76%
INTRAVENOUS | Status: AC
  Administered 2017-02-26: 100 mL
  Filled 2017-02-26: qty 100

## 2017-02-26 MED ORDER — HYDROCODONE-ACETAMINOPHEN 5-325 MG PO TABS
1.0000 | ORAL_TABLET | Freq: Four times a day (QID) | ORAL | Status: DC | PRN
  Filled 2017-02-26: qty 1

## 2017-02-26 MED ORDER — ASPIRIN 81 MG PO CHEW
81.0000 mg | CHEWABLE_TABLET | Freq: Every day | ORAL | Status: DC
  Administered 2017-02-27 – 2017-03-01 (×3): 81 mg via ORAL
  Filled 2017-02-26 (×3): qty 1

## 2017-02-26 MED ORDER — LEVALBUTEROL HCL 0.63 MG/3ML IN NEBU: 0.6300 mg | INHALATION_SOLUTION | Freq: Four times a day (QID) | RESPIRATORY_TRACT | Status: DC | PRN

## 2017-02-26 MED ORDER — METHOCARBAMOL 500 MG PO TABS
500.0000 mg | ORAL_TABLET | Freq: Three times a day (TID) | ORAL | Status: DC | PRN
  Administered 2017-02-28: 500 mg via ORAL
  Filled 2017-02-26 (×3): qty 1

## 2017-02-26 MED ORDER — APIXABAN 5 MG PO TABS
5.0000 mg | ORAL_TABLET | Freq: Two times a day (BID) | ORAL | Status: DC
  Administered 2017-02-26 – 2017-03-01 (×6): 5 mg via ORAL
  Filled 2017-02-26 (×6): qty 1

## 2017-02-26 MED ORDER — SODIUM CHLORIDE 0.9% FLUSH
3.0000 mL | Freq: Two times a day (BID) | INTRAVENOUS | Status: DC
  Administered 2017-02-26 – 2017-03-01 (×7): 3 mL via INTRAVENOUS

## 2017-02-26 MED ORDER — CARVEDILOL 3.125 MG PO TABS
3.1250 mg | ORAL_TABLET | Freq: Every day | ORAL | Status: DC
  Administered 2017-02-26 – 2017-03-01 (×4): 3.125 mg via ORAL
  Filled 2017-02-26 (×4): qty 1

## 2017-02-26 MED ORDER — AMIODARONE HCL 200 MG PO TABS
400.0000 mg | ORAL_TABLET | Freq: Two times a day (BID) | ORAL | Status: DC
  Administered 2017-02-26 – 2017-03-01 (×6): 400 mg via ORAL
  Filled 2017-02-26 (×6): qty 2

## 2017-02-26 MED ORDER — FUROSEMIDE 10 MG/ML IJ SOLN
40.0000 mg | INTRAMUSCULAR | Status: AC
  Administered 2017-02-26: 40 mg via INTRAVENOUS
  Filled 2017-02-26: qty 4

## 2017-02-26 MED ORDER — ONDANSETRON HCL 4 MG/2ML IJ SOLN: 4.0000 mg | Freq: Four times a day (QID) | INTRAMUSCULAR | Status: DC | PRN

## 2017-02-26 NOTE — Progress Notes (Signed)
CRITICAL VALUE ALERT  Critical value received:  Troponin 0.07  Date of notification:  02/26/2017  Time of notification: 3601  Critical value read back: yes  Nurse who received alert:  Hermina Barters  MD notified (1st page):  Walden Field NP  Time of first page:  2003  Responding MD:  Walden Field NP  Time MD responded:  2006

## 2017-02-26 NOTE — Consult Note (Addendum)
Patient ID: Ryan [name] MRN: [id number], DOB/AGE: Jul 01, 1941   Admit date: 02/26/2017   Reason for Consult: CHF, New Onset Atrial Fibrillation  Requesting MD: Dr. Allyson Sabal, Internal Medicine   Primary Physician: Marton Redwood, MD Primary Cardiologist: Dr. Acie Fredrickson   Pt. Profile:  76 y.o. male with a hx of CAD s/p CABG in 03/2015, carotid artery disease s/p L CEA with Dr. Kellie Simmering in 5/17 , HTN and HLD, presenting to the ED with complaint of dyspnea. BNP and CXR findings c/w acute CHF. EKG and telemetry also demonstrate new onset atrial fibrillation.   Problem List  Past Medical History:  Diagnosis Date  . Arthritis   . CAD (coronary artery disease)   . Carotid artery occlusion   . Cataract    Bil eyes/worse in left eye  . CHF (congestive heart failure) ([place])   . Chronic back pain   . DVT (deep venous thrombosis) ([place])   . Dysrhythmia   . Enlarged prostate    takes Rapaflo daily  . GERD (gastroesophageal reflux disease)    occasional  . History of colon polyps   . History of gout    has colchicine prn  . History of kidney stones   . Hyperlipidemia    takes Crestor daily  . Hypertension    takes Amlodipine daily  . Hypothyroidism   . Myocardial infarction   . Peripheral vascular disease ([place])   . Pulmonary emboli ([place]) 03/20/2015   elevated d-dimer, intermediate V/Q study, atypical chest pain and SOB. Start on Xarelto 20mg  BID for 3 month  . Renal insufficiency   . Shortness of breath dyspnea   . Urinary frequency   . Urinary urgency     Past Surgical History:  Procedure Laterality Date  . APPENDECTOMY    . BACK SURGERY     5 times  . big toe surgery    . CARDIAC CATHETERIZATION     2010    dr Acie Fredrickson  . cataract surgery     left eye  . CHOLECYSTECTOMY N/A 07/27/2016   Procedure: LAPAROSCOPIC CHOLECYSTECTOMY;  Surgeon: Mickeal Skinner, MD;  Location: [place];  Service: General;  Laterality: N/A;  . COLONOSCOPY    . CORONARY ARTERY BYPASS GRAFT N/A  04/05/2015   Procedure: CORONARY ARTERY BYPASS GRAFTING (CABG)X4 LIMA-LAD; SVG-DIAG1-DIAG2; SVG-PD;  Surgeon: [name], MD;  Location: [place];  Service: Open Heart Surgery;  Laterality: N/A;  . CYSTOSCOPY    . ENDARTERECTOMY Left 04/24/2016   Procedure: ENDARTERECTOMY LEFT CAROTID;  Surgeon: Mal Misty, MD;  Location: [facility];  Service: Vascular;  Laterality: Left;  . EYE SURGERY    . FEMORAL ARTERY - POPLITEAL ARTERY BYPASS GRAFT    . JOINT REPLACEMENT     shoulder  . LEFT HEART CATHETERIZATION WITH CORONARY ANGIOGRAM N/A 04/03/2015   Procedure: LEFT HEART CATHETERIZATION WITH CORONARY ANGIOGRAM;  Surgeon: Troy Sine, MD;  Location: [facility] CATH LAB;  Service: Cardiovascular;  Laterality: N/A;  . LUMBAR LAMINECTOMY  01/06/2013   Procedure: MICRODISCECTOMY LUMBAR LAMINECTOMY;  Surgeon: Marybelle Killings, MD;  Location: [place];  Service: Orthopedics;  Laterality: N/A;  L3-4 decompression  . LUMBAR LAMINECTOMY/DECOMPRESSION MICRODISCECTOMY  02/12/2012   Procedure: LUMBAR LAMINECTOMY/DECOMPRESSION MICRODISCECTOMY;  Surgeon: Floyce Stakes, MD;  Location: [place] [facility];  Service: Neurosurgery;  Laterality: N/A;  Lumbar four-five laminectomy  . PATCH ANGIOPLASTY Left 04/24/2016   Procedure: LEFT CAROTID ARTERY PATCH ANGIOPLASTY;  Surgeon: Mal Misty, MD;  Location: [place];  Service: Vascular;  Laterality: Left;  . STERIOD INJECTION Right 01/09/2014   Procedure: STEROID INJECTION;  Surgeon: Mcarthur Rossetti, MD;  Location: [place];  Service: Orthopedics;  Laterality: Right;  . TEE WITHOUT CARDIOVERSION N/A 04/05/2015   Procedure: TRANSESOPHAGEAL ECHOCARDIOGRAM (TEE);  Surgeon: [name], MD;  Location: [place];  Service: Open Heart Surgery;  Laterality: N/A;  . TOTAL HIP ARTHROPLASTY Left 01/09/2014   DR Ninfa Linden  . TOTAL HIP ARTHROPLASTY Left 01/09/2014   Procedure: LEFT TOTAL HIP ARTHROPLASTY ANTERIOR APPROACH and Steroid Injection Right hip;  Surgeon: Mcarthur Rossetti, MD;   Location: [place];  Service: Orthopedics;  Laterality: Left;     Allergies  Allergies  Allergen Reactions  . Codeine Nausea And Vomiting       . Zetia [Ezetimibe] Other (See Comments)    Myalgias     HPI  76 y.o. male with a hx of CAD s/p prior MIs, carotid artery disease, HTN, HL.  He was treated with Xarelto for suspected pulmonary embolism (Intermediate VQ scan) in 4/16.  However, he returned to the hospital with recurrent chest pain in 4/16 and ruled in for a myocardial infarction.  CT was neg for pulmonary embolism and a LHC demonstrated 3v CAD. EF was normal at 60%.  He was also noted to have severe L carotid stenosis.  He underwent CABG with L-LAD, S-D1/D2, S-PDA with Dr. Roxan Hockey.  He subsequently underwent L CEA with Dr. Kellie Simmering in 5/17.  In August 2017, he underwent a lop cholecystectomy.   He presents now to the [facility] ED with a complaint of intermitted exertion dyspnea and dizziness, off and on, for the last 2 months. Improves with rest. No anterior chest pain, however he has had sharp right scapular pain. He had worsening symptoms today, prompting presentation to the ED.  BNP is 539. CXR shows Cardiomegaly, trace effusions, and pulmonary vascular congestion.  EKG and telemetry show new onset atrial fibrillation. HR in the 110s. BP is elevated in the ED. He denies palpations. No prior h/o stroke TIA.   Home Medications  Prior to Admission medications   Medication Sig Start Date End Date Taking? Authorizing Provider  aspirin 81 MG chewable tablet Chew 81 mg by mouth daily.   Yes Historical Provider, MD  carvedilol (COREG) 3.125 MG tablet Take 3.125 mg by mouth daily. 12/16/16  Yes Historical Provider, MD  docusate sodium (COLACE) 100 MG capsule Take 100 mg by mouth 2 (two) times daily.   Yes Historical Provider, MD  ezetimibe (ZETIA) 10 MG tablet Take 10 mg by mouth daily.   Yes Historical Provider, MD  HYDROcodone-acetaminophen (NORCO/VICODIN) 5-325 MG tablet Take 1 tablet by mouth  every 6 (six) hours as needed for moderate pain. 07/28/16  Yes Arta Bruce Kinsinger, MD  methocarbamol (ROBAXIN) 500 MG tablet Take 1 tablet (500 mg total) by mouth every 8 (eight) hours as needed for muscle spasms. 10/19/16  Yes Margette Fast, MD  naloxegol oxalate (MOVANTIK) 25 MG TABS tablet Take 1 tablet (25 mg total) by mouth daily. 02/17/17  Yes Mauri Pole, MD  nitroGLYCERIN (NITROSTAT) 0.4 MG SL tablet Place 1 tablet (0.4 mg total) under the tongue every 5 (five) minutes as needed for chest pain. 05/27/16  Yes Thayer Headings, MD  RAPAFLO 8 MG CAPS capsule Take 8 mg by mouth daily. 10/04/16  Yes Historical Provider, MD    Family History  Family History  Problem Relation Age of Onset  . Heart disease Father   .  Heart attack Father   . Heart disease Sister   . Hypertension Sister   . Heart attack Sister   . Hypertension Mother   . Diabetes Son     Social History  Social History   Social History  . Marital status: Married    Spouse name: N/A  . Number of children: N/A  . Years of education: N/A   Occupational History  . Not on file.   Social History Main Topics  . Smoking status: Former Smoker    Types: Cigarettes    Quit date: 02/04/1987  . Smokeless tobacco: Former Systems developer    Types: [place] date: 07/20/2009     Comment: quit 35+yrs ago  . Alcohol use No  . Drug use: No  . Sexual activity: Not Currently   Other Topics Concern  . Not on file   Social History Narrative  . No narrative on file     Review of Systems General:  No chills, fever, night sweats or weight changes.  Cardiovascular:  No chest pain, + dyspnea on exertion,  No edema, orthopnea, palpitations, paroxysmal nocturnal dyspnea. Dermatological: No rash, lesions/masses Respiratory: No cough, dyspnea Urologic: No hematuria, dysuria Abdominal:   No nausea, vomiting, diarrhea, bright red blood per rectum, melena, or hematemesis Neurologic:  No visual changes, wkns, changes in mental  status. All other systems reviewed and are otherwise negative except as noted above.  Physical Exam  Blood pressure 154/94, pulse 89, temperature 97.5 F (36.4 C), temperature source Oral, resp. rate 18, SpO2 96 %.  General: Pleasant, NAD Psych: Normal affect. Neuro: Alert and oriented X 3. Moves all extremities spontaneously. HEENT: Normal  Neck: Supple without bruits or JVD. Lungs:  Resp regular and unlabored, right sided basilar rales Heart: irregularly irregular, slightly tachy rate, no s3, s4, or murmurs. Abdomen: Soft, non-tender, non-distended, BS + x 4.  Extremities: No clubbing, cyanosis or edema. DP/PT/Radials 2+ and equal bilaterally.  Labs  Troponin (Point of Care Test)  Recent Labs  02/26/17 1315  TROPIPOC 0.06   No results for input(s): CKTOTAL, CKMB, TROPONINI in the last 72 hours. Lab Results  Component Value Date   WBC 10.8 (H) 02/26/2017   HGB 15.9 02/26/2017   HCT 47.3 02/26/2017   MCV 93.8 02/26/2017   PLT 244 02/26/2017    Recent Labs Lab 02/26/17 1209  NA 138  K 3.9  CL 106  CO2 22  BUN 10  CREATININE 1.00  CALCIUM 9.1  GLUCOSE 115*   Lab Results  Component Value Date   CHOL 207 (H) 01/20/2017   HDL 45 01/20/2017   LDLCALC 138 (H) 01/20/2017   TRIG 121 01/20/2017   Lab Results  Component Value Date   DDIMER 2.14 (H) 03/20/2015     Radiology/Studies  Dg Chest 2 View  Result Date: 02/26/2017 CLINICAL DATA:  Chest pain and shortness of breath EXAM: CHEST  2 VIEW COMPARISON:  05/07/2015 FINDINGS: Status post CABG.Cardiomegaly and pulmonary vessel congestion. No notable vascular pedicle widening. Trace effusions. No air bronchogram. No pneumothorax. Glenohumeral arthroplasty. Chronic fracture of a left glenoid screw. IMPRESSION: Cardiomegaly, trace effusions, and pulmonary vascular congestion. Electronically Signed   By: Monte Fantasia M.D.   On: 02/26/2017 14:01    ECG  Atrial fibrillation VR in the 90s - personally reviewed     ASSESSMENT AND PLAN  1. Acute CHF: 2 month h/o exertional dyspnea. He denies any significant weight gain. No orthopnea/PND or LEE. BNP is abnormal at  535. CXR shows Cardiomegaly, trace effusions, and pulmonary vascular congestion. Right sided basilar rales noted on exam. He has been started on IV Lasix in the ED and has a foley. He is voiding frequently. No resting dyspnea currently. Agree with admission to [place]. We will reassess response to Lasix in the AM and give additional lasix if needed. Strict I/Os and daily weights. Monitor renal function, electrolytes and BP. Low sodium diet. He will need a 2D echo to assess if systolic vs diastolic or combined. His atrial fibrillation is likely contributing. Recommend obtaining echo when HR is < 100 to obtain better quality study.   2. New Onset Atrial Fibrillation: 2 month h/o intermittent exertional dyspnea and dizziness. Rate is currently in the 110s. He denies palpitations. No resting dyspnea. Continue BB. He has been on low dose Coreg, 3.125 mg BID. We will either need to increase dose or consider switch to metoprolol. This patients CHA2DS2-VASc Score and unadjusted Ischemic Stroke Rate (% per year) is equal to 7.2 % stroke rate/year from a score of 5 (CHF, HTN, Age >75 and vascular disease). His renal function is normal. Would recommend initiation of Eliquis, 5 mg BID. Since he has been symptomatic, may consider TEE/DCCV prior to d/c if no spontaneous conversion with rate control strategy. Check TSH and Mg. K is WNL. Obtain 2D echo, once rate is adequately controlled, to assess LVEF and LA size.   3. Right Scapular Pain: sharp in nature + dyspnea. D-dimer pending. IM will follow result. Recommend CT scan to r/o PE if abnormal.   4. CAD: s/p CABG in 2016. With new onset CHF + new atrial arrthmia, we will need to consider ischemic evaluation. 2D echo also pending. For now, continue medical therapy with ASA, BB and statin.   5. Carotid Artery Disease: s/p  left CEA per Dr. Kellie Simmering 04/2015. Followed yearly by vascular surgery.  Most recent carotid doppler 11/2016 showed patent left carotid endarterectomy site w/o restenosis and <40% right ICA stenosis. Continue ASA and statin.   MD to assess and will provide further recommendations. We will continue to follow along with you.   Signed, Lyda Jester, PA-C 02/26/2017, 4:07 PM  Patient seen, examined. Available data reviewed. Agree with findings, assessment, and plan as outlined by Lyda Jester, PA-C. The patient is interviewed at the bedside - his wife is present. He complains of progressive exertional dyspnea over the past 2 weeks. Exam reveals alert, oriented male in NAD, lungs with bibasilar rales, CV: irreg irreg with no murmur or gallop, neck: no JVD, abdomen soft but lower abdomen distended and tender (full bladder), extremities no edema.  EKG: AF with RVR, no acute ST-T changes  CXR/lab data suggestive acute CHF  Plan:  He is symptomatic with AF and now with symptoms of CHF  amio 400 mg BID  eliquis for anticoagulation as above  TEE/cardioversion Monday if stable  Diurese with IV lasix - needs foley his bladder is distended and he has bladder outlet symptoms. Continue rapaflo  Continue coreg   Plan discussed with patient and wife at bedside  Check 2D echo  Sherren Mocha, M.D. 02/26/2017 6:47 PM

## 2017-02-26 NOTE — H&P (Signed)
History and Physical    MARCUS SCHWANDT KXF:[id number] DOB: 25-Nov-1941 DOA: 02/26/2017  PCP: Marton Redwood, MD   Patient coming from: Home  Chief Complaint: Dyspnea, chest discomfort and right shoulder blade pain  HPI: Ryan [name] is a 76 y.o. male with medical history significant of CAD, s/p CABG (2015), peripheral vascular disease affecting carotid arteries and lower extremity circulation, CHF, hyperlipidemia, hypertension, GERD who presented to the emergency department with complaints of shortness of breath. Patient reported that during the last month associated having progressive shortness of breath intermittently accompanied by chest discomfort. During the last 3-4 days dyspnea became more pronounced and occurs even at rest. The patient reported that the symptoms remind him the month that he had prior to his open heart surgery. In 2016 patient had VQ scan that revealed intermediate probability of PE, he was placed on Xarelto for short period of time, but symptoms persisted, he was seen by cardiology and soon after underwent CABG He also c/o occasional palpitations, weakness and   Right shoulder bladde persistent pain that has been present for almost a year.    ED Course: On arrival to the ED [ppatient had stable VS, blood work showed slightly elevaetd WBC count 10,800, elevated BNP to 535.5 and troponin 0.06; otherwise blood work  Unremarkable Chest Xray showed CM, mild pleural effusions, pulmonary vascular congestion   EKG - first tracing showed frequent PACs undetermined rhythm stable wandering pacemaker, second EKG showed rhythm consistent with atrial fibrillation    Review of Systems: As per HPI otherwise 10 point review of systems negative.   Ambulatory Status: Independent  Past Medical History:  Diagnosis Date  . Arthritis   . CAD (coronary artery disease)   . Carotid artery occlusion   . Cataract    Bil eyes/worse in left eye  . CHF (congestive heart failure) ([place])    . Chronic back pain   . DVT (deep venous thrombosis) ([place])   . Dysrhythmia   . Enlarged prostate    takes Rapaflo daily  . GERD (gastroesophageal reflux disease)    occasional  . History of colon polyps   . History of gout    has colchicine prn  . History of kidney stones   . Hyperlipidemia    takes Crestor daily  . Hypertension    takes Amlodipine daily  . Hypothyroidism   . Myocardial infarction   . Peripheral vascular disease ([place])   . Pulmonary emboli ([place]) 03/20/2015   elevated d-dimer, intermediate V/Q study, atypical chest pain and SOB. Start on Xarelto 20mg  BID for 3 month  . Renal insufficiency   . Shortness of breath dyspnea   . Urinary frequency   . Urinary urgency     Past Surgical History:  Procedure Laterality Date  . APPENDECTOMY    . BACK SURGERY     5 times  . big toe surgery    . CARDIAC CATHETERIZATION     2010    dr Acie Fredrickson  . cataract surgery     left eye  . CHOLECYSTECTOMY N/A 07/27/2016   Procedure: LAPAROSCOPIC CHOLECYSTECTOMY;  Surgeon: Mickeal Skinner, MD;  Location: [place];  Service: General;  Laterality: N/A;  . COLONOSCOPY    . CORONARY ARTERY BYPASS GRAFT N/A 04/05/2015   Procedure: CORONARY ARTERY BYPASS GRAFTING (CABG)X4 LIMA-LAD; SVG-DIAG1-DIAG2; SVG-PD;  Surgeon: [name], MD;  Location: [place];  Service: Open Heart Surgery;  Laterality: N/A;  . CYSTOSCOPY    . ENDARTERECTOMY Left 04/24/2016  Procedure: ENDARTERECTOMY LEFT CAROTID;  Surgeon: Mal Misty, MD;  Location: [place];  Service: Vascular;  Laterality: Left;  . EYE SURGERY    . FEMORAL ARTERY - POPLITEAL ARTERY BYPASS GRAFT    . JOINT REPLACEMENT     shoulder  . LEFT HEART CATHETERIZATION WITH CORONARY ANGIOGRAM N/A 04/03/2015   Procedure: LEFT HEART CATHETERIZATION WITH CORONARY ANGIOGRAM;  Surgeon: Troy Sine, MD;  Location: [facility] CATH LAB;  Service: Cardiovascular;  Laterality: N/A;  . LUMBAR LAMINECTOMY  01/06/2013   Procedure: MICRODISCECTOMY LUMBAR  LAMINECTOMY;  Surgeon: Marybelle Killings, MD;  Location: [place];  Service: Orthopedics;  Laterality: N/A;  L3-4 decompression  . LUMBAR LAMINECTOMY/DECOMPRESSION MICRODISCECTOMY  02/12/2012   Procedure: LUMBAR LAMINECTOMY/DECOMPRESSION MICRODISCECTOMY;  Surgeon: Floyce Stakes, MD;  Location: [place] [facility];  Service: Neurosurgery;  Laterality: N/A;  Lumbar four-five laminectomy  . PATCH ANGIOPLASTY Left 04/24/2016   Procedure: LEFT CAROTID ARTERY PATCH ANGIOPLASTY;  Surgeon: Mal Misty, MD;  Location: [place];  Service: Vascular;  Laterality: Left;  . STERIOD INJECTION Right 01/09/2014   Procedure: STEROID INJECTION;  Surgeon: Mcarthur Rossetti, MD;  Location: [place];  Service: Orthopedics;  Laterality: Right;  . TEE WITHOUT CARDIOVERSION N/A 04/05/2015   Procedure: TRANSESOPHAGEAL ECHOCARDIOGRAM (TEE);  Surgeon: [name], MD;  Location: [place];  Service: Open Heart Surgery;  Laterality: N/A;  . TOTAL HIP ARTHROPLASTY Left 01/09/2014   DR Ninfa Linden  . TOTAL HIP ARTHROPLASTY Left 01/09/2014   Procedure: LEFT TOTAL HIP ARTHROPLASTY ANTERIOR APPROACH and Steroid Injection Right hip;  Surgeon: Mcarthur Rossetti, MD;  Location: [place];  Service: Orthopedics;  Laterality: Left;    Social History   Social History  . Marital status: Married    Spouse name: N/A  . Number of children: N/A  . Years of education: N/A   Occupational History  . Not on file.   Social History Main Topics  . Smoking status: Former Smoker    Types: Cigarettes    Quit date: 02/04/1987  . Smokeless tobacco: Former Systems developer    Types: [place] date: 07/20/2009     Comment: quit 35+yrs ago  . Alcohol use No  . Drug use: No  . Sexual activity: Not Currently   Other Topics Concern  . Not on file   Social History Narrative  . No narrative on file    Allergies  Allergen Reactions  . Codeine Nausea And Vomiting       . Zetia [Ezetimibe] Other (See Comments)    Myalgias     Family History  Problem  Relation Age of Onset  . Heart disease Father   . Heart attack Father   . Heart disease Sister   . Hypertension Sister   . Heart attack Sister   . Hypertension Mother   . Diabetes Son     Prior to Admission medications   Medication Sig Start Date End Date Taking? Authorizing Provider  aspirin 81 MG chewable tablet Chew 81 mg by mouth daily.   Yes Historical Provider, MD  carvedilol (COREG) 3.125 MG tablet Take 3.125 mg by mouth daily. 12/16/16  Yes Historical Provider, MD  docusate sodium (COLACE) 100 MG capsule Take 100 mg by mouth 2 (two) times daily.   Yes Historical Provider, MD  ezetimibe (ZETIA) 10 MG tablet Take 10 mg by mouth daily.   Yes Historical Provider, MD  HYDROcodone-acetaminophen (NORCO/VICODIN) 5-325 MG tablet Take 1 tablet by mouth every 6 (six) hours  as needed for moderate pain. 07/28/16  Yes Arta Bruce Kinsinger, MD  methocarbamol (ROBAXIN) 500 MG tablet Take 1 tablet (500 mg total) by mouth every 8 (eight) hours as needed for muscle spasms. 10/19/16  Yes Margette Fast, MD  naloxegol oxalate (MOVANTIK) 25 MG TABS tablet Take 1 tablet (25 mg total) by mouth daily. 02/17/17  Yes Mauri Pole, MD  nitroGLYCERIN (NITROSTAT) 0.4 MG SL tablet Place 1 tablet (0.4 mg total) under the tongue every 5 (five) minutes as needed for chest pain. 05/27/16  Yes Thayer Headings, MD  RAPAFLO 8 MG CAPS capsule Take 8 mg by mouth daily. 10/04/16  Yes Historical Provider, MD    Physical Exam: Vitals:   02/26/17 1308 02/26/17 1330 02/26/17 1445 02/26/17 1515  BP: (!) 164/105 176/93 137/80 154/94  Pulse: 96 92 89 89  Resp: 20 21 15 18   Temp: 97.5 F (36.4 C)     TempSrc: Oral     SpO2: 99% 97% 95% 96%     General: Appears calm and comfortable Eyes: PERRLA, EOMI, normal lids, iris ENT:  grossly normal hearing, lips & tongue, mucous membranes moist and intact Neck: no lymphoadenopathy, masses or thyromegaly Cardiovascular: RRR, no m/r/g. No JVD, carotid bruits. No LE edema.    Respiratory: bilateral no wheezes, rales, rhonchi or cracles. Normal respiratory effort. No accessory muscle use observed Abdomen: soft, non-tender, non-distended, no organomegaly or masses appreciated. BS present in all quadrants Skin: no rash, ulcers or induration seen on limited exam Musculoskeletal: grossly normal tone BUE/BLE, good ROM, no bony abnormality or joint deformities observed, right shoulder blade has visible swelling and is very tender to touch Psychiatric: grossly normal mood and affect, speech fluent and appropriate, alert and oriented x3 Neurologic: CN II-XII grossly intact, moves all extremities in coordinated fashion, sensation intact  Labs on Admission: I have personally reviewed following labs and imaging studies  CBC, BMP  GFR: Estimated Creatinine Clearance: 63 mL/min (by C-G formula based on SCr of 1 mg/dL).   Creatinine Clearance: Estimated Creatinine Clearance: 63 mL/min (by C-G formula based on SCr of 1 mg/dL).    Radiological Exams on Admission: Dg Chest 2 View  Result Date: 02/26/2017 CLINICAL DATA:  Chest pain and shortness of breath EXAM: CHEST  2 VIEW COMPARISON:  05/07/2015 FINDINGS: Status post CABG.Cardiomegaly and pulmonary vessel congestion. No notable vascular pedicle widening. Trace effusions. No air bronchogram. No pneumothorax. Glenohumeral arthroplasty. Chronic fracture of a left glenoid screw. IMPRESSION: Cardiomegaly, trace effusions, and pulmonary vascular congestion. Electronically Signed   By: Monte Fantasia M.D.   On: 02/26/2017 14:01    EKG: Independently reviewed -  Atrial fibrillation, no acute ST-TW changes   Assessment/Plan Principal Problem:   Dyspnea Active Problems:   HTN (hypertension)   Pain in the chest   Coronary artery disease involving native coronary artery of native heart without angina pectoris   S/P CABG x 4   PAD (peripheral artery disease) (HCC)   HLD (hyperlipidemia)    Dyspnea at rest and on exertion  with very minimal short lived chest pain Could be secondary to new onset AFib and / or CHF exacerbation - BNP is elevated at 535.5 and chest Xray showed trace pleural effusions and CM with pulmonary vascular congestion Continue to cycle cardiac enzymes, monitor on telemetry, obtain chest CT angiogram to r/o PE  Continue Lasix 40 mg IV bid Cardiology consult was request ED  Hypertension Continue home medications and adjust doses if needed  Hyperlipidemia Continue  Zetia and consider statin therapy  PAF - rate controlled Will obtain Echocardiogram Cardiology consult requested, patient most likely will require systemic anticoagulation for primary stroke prevention  Right shoulder blade pain Continue pain management, and muscle relaxer    DVT prophylaxis: Lovenox Code Status: full  Family Communication: at bedside Disposition Plan: telemetry Consults called: cardiology Admission status: observation   Caprice Red Pager: [phone] [facility]  If 7PM-7AM, please contact night-coverage [email or web address] Password [facility]  02/26/2017, 3:48 PM

## 2017-02-26 NOTE — ED Provider Notes (Signed)
[facility] DEPT Provider Note   CSN: [id number] Arrival date & time: 02/26/17  1301     History   Chief Complaint Chief Complaint  Patient presents with  . Shortness of Breath    HPI ZIMERE DUNLEVY is a 76 y.o. male.  The history is provided by the patient and medical records.  Shortness of Breath     76 year old male with history of arthritis, coronary artery disease, congestive heart failure, hypertension, hyperlipidemia, hypothyroidism, peripheral vascular disease, history of alleged PE with inconclusive VQ scan, presenting to the ED for shortness of breath. Patient reports for the past month he has had intermittent shortness of breath. States he will be doing well and then all of a sudden feel as if he has run a marathon and cannot catch his breath. States this will last for 5-10 minutes and then returned to normal. States he also gets some intermittent, sharp, stabbing pains in his chest.  States these occur with exertion a lot of times. States he also feels lightheaded when this happens. He does have history of CAD with CABG. He is followed by cardiology, Dr. Acie Fredrickson.  He takes daily aspirin but no other anticoagulation. He denies any cough or other recent illness.  States he has talked to his PCP about this and was sent here for further evaluation.  Patient also reports some ongoing right shoulder pain. This is been present for the past year. He was told this was a secondary to multiple etiologies including gallbladder, kidney stone, etc. but reports no one seems to understand where his pain is coming from. He localizes this to his posterior right shoulder along the shoulder blade. This is not necessarily changed in character over the past year.  Past Medical History:  Diagnosis Date  . Arthritis   . CAD (coronary artery disease)   . Carotid artery occlusion   . Cataract    Bil eyes/worse in left eye  . CHF (congestive heart failure) ([place])   . Chronic back pain   . DVT  (deep venous thrombosis) ([place])   . Dysrhythmia   . Enlarged prostate    takes Rapaflo daily  . GERD (gastroesophageal reflux disease)    occasional  . History of colon polyps   . History of gout    has colchicine prn  . History of kidney stones   . Hyperlipidemia    takes Crestor daily  . Hypertension    takes Amlodipine daily  . Hypothyroidism   . Myocardial infarction   . Peripheral vascular disease ([place])   . Pulmonary emboli ([place]) 03/20/2015   elevated d-dimer, intermediate V/Q study, atypical chest pain and SOB. Start on Xarelto 20mg  BID for 3 month  . Renal insufficiency   . Shortness of breath dyspnea   . Urinary frequency   . Urinary urgency     Patient Active Problem List   Diagnosis Date Noted  . HLD (hyperlipidemia) 01/20/2017  . Chronic cholecystitis 07/27/2016  . PAD (peripheral artery disease) ([place]) 07/09/2015  . S/P CABG x 4 04/05/2015  . Pain in the chest   . Coronary artery disease involving native coronary artery of native heart without angina pectoris   . Atypical chest pain 03/20/2015  . Carotid artery disease ([place]) 10/09/2014  . PVD (peripheral vascular disease) ([place]) 10/09/2014  . HTN (hypertension) 05/30/2014  . Arthritis of left hip 01/09/2014  . Status post THR (total hip replacement) 01/09/2014  . Spinal stenosis, lumbar 01/06/2013    Class: Diagnosis of  .  Atherosclerosis of native artery of extremity with intermittent claudication ([place]) 10/18/2012    Past Surgical History:  Procedure Laterality Date  . APPENDECTOMY    . BACK SURGERY     5 times  . big toe surgery    . CARDIAC CATHETERIZATION     2010    dr Acie Fredrickson  . cataract surgery     left eye  . CHOLECYSTECTOMY N/A 07/27/2016   Procedure: LAPAROSCOPIC CHOLECYSTECTOMY;  Surgeon: Mickeal Skinner, MD;  Location: [facility];  Service: General;  Laterality: N/A;  . COLONOSCOPY    . CORONARY ARTERY BYPASS GRAFT N/A 04/05/2015   Procedure: CORONARY ARTERY BYPASS GRAFTING (CABG)X4 LIMA-LAD;  SVG-DIAG1-DIAG2; SVG-PD;  Surgeon: [name], MD;  Location: [facility];  Service: Open Heart Surgery;  Laterality: N/A;  . CYSTOSCOPY    . ENDARTERECTOMY Left 04/24/2016   Procedure: ENDARTERECTOMY LEFT CAROTID;  Surgeon: Mal Misty, MD;  Location: [facility];  Service: Vascular;  Laterality: Left;  . EYE SURGERY    . FEMORAL ARTERY - POPLITEAL ARTERY BYPASS GRAFT    . JOINT REPLACEMENT     shoulder  . LEFT HEART CATHETERIZATION WITH CORONARY ANGIOGRAM N/A 04/03/2015   Procedure: LEFT HEART CATHETERIZATION WITH CORONARY ANGIOGRAM;  Surgeon: Troy Sine, MD;  Location: [facility] CATH LAB;  Service: Cardiovascular;  Laterality: N/A;  . LUMBAR LAMINECTOMY  01/06/2013   Procedure: MICRODISCECTOMY LUMBAR LAMINECTOMY;  Surgeon: Marybelle Killings, MD;  Location: [facility];  Service: Orthopedics;  Laterality: N/A;  L3-4 decompression  . LUMBAR LAMINECTOMY/DECOMPRESSION MICRODISCECTOMY  02/12/2012   Procedure: LUMBAR LAMINECTOMY/DECOMPRESSION MICRODISCECTOMY;  Surgeon: Floyce Stakes, MD;  Location: [facility]UR[facility];  Service: Neurosurgery;  Laterality: N/A;  Lumbar four-five laminectomy  . PATCH ANGIOPLASTY Left 04/24/2016   Procedure: LEFT CAROTID ARTERY PATCH ANGIOPLASTY;  Surgeon: Mal Misty, MD;  Location: [facility];  Service: Vascular;  Laterality: Left;  . STERIOD INJECTION Right 01/09/2014   Procedure: STEROID INJECTION;  Surgeon: Mcarthur Rossetti, MD;  Location: [facility];  Service: Orthopedics;  Laterality: Right;  . TEE WITHOUT CARDIOVERSION N/A 04/05/2015   Procedure: TRANSESOPHAGEAL ECHOCARDIOGRAM (TEE);  Surgeon: [name], MD;  Location: [facility];  Service: Open Heart Surgery;  Laterality: N/A;  . TOTAL HIP ARTHROPLASTY Left 01/09/2014   DR Ninfa Linden  . TOTAL HIP ARTHROPLASTY Left 01/09/2014   Procedure: LEFT TOTAL HIP ARTHROPLASTY ANTERIOR APPROACH and Steroid Injection Right hip;  Surgeon: Mcarthur Rossetti, MD;  Location: [facility];  Service: Orthopedics;  Laterality: Left;       Home  Medications    Prior to Admission medications   Medication Sig Start Date End Date Taking? Authorizing Provider  aspirin 81 MG chewable tablet Chew 81 mg by mouth daily.    Historical Provider, MD  carvedilol (COREG) 3.125 MG tablet Take 3.125 mg by mouth daily. 12/16/16   Historical Provider, MD  docusate sodium (COLACE) 100 MG capsule Take 100 mg by mouth 2 (two) times daily.    Historical Provider, MD  ezetimibe (ZETIA) 10 MG tablet Take 10 mg by mouth daily.    Historical Provider, MD  HYDROcodone-acetaminophen (NORCO/VICODIN) 5-325 MG tablet Take 1 tablet by mouth every 6 (six) hours as needed for moderate pain. 07/28/16   Mickeal Skinner, MD  methocarbamol (ROBAXIN) 500 MG tablet Take 1 tablet (500 mg total) by mouth every 8 (eight) hours as needed for muscle spasms. 10/19/16   Margette Fast, MD  naloxegol oxalate (MOVANTIK) 25 MG TABS tablet Take  1 tablet (25 mg total) by mouth daily. 02/17/17   Mauri Pole, MD  nitroGLYCERIN (NITROSTAT) 0.4 MG SL tablet Place 1 tablet (0.4 mg total) under the tongue every 5 (five) minutes as needed for chest pain. 05/27/16   Thayer Headings, MD  RAPAFLO 8 MG CAPS capsule Take 8 mg by mouth daily. 10/04/16   Historical Provider, MD    Family History Family History  Problem Relation Age of Onset  . Heart disease Father   . Heart attack Father   . Heart disease Sister   . Hypertension Sister   . Heart attack Sister   . Hypertension Mother   . Diabetes Son     Social History Social History  Substance Use Topics  . Smoking status: Former Smoker    Types: Cigarettes    Quit date: 02/04/1987  . Smokeless tobacco: Former Systems developer    Types: [place] date: 07/20/2009     Comment: quit 35+yrs ago  . Alcohol use No     Allergies   Codeine and Zetia [ezetimibe]   Review of Systems Review of Systems  Respiratory: Positive for shortness of breath.   All other systems reviewed and are negative.    Physical Exam Updated Vital  Signs BP 176/93   Pulse 92   Temp 97.5 F (36.4 C) (Oral)   Resp 21   SpO2 97%   Physical Exam  Constitutional: He is oriented to person, place, and time. He appears well-developed and well-nourished.  HENT:  Head: Normocephalic and atraumatic.  Mouth/Throat: Oropharynx is clear and moist.  Eyes: Conjunctivae and EOM are normal. Pupils are equal, round, and reactive to light.  Neck: Normal range of motion.  Cardiovascular: Normal rate, regular rhythm and normal heart sounds.   Pulmonary/Chest: Effort normal and breath sounds normal. He has no wheezes. He has no rhonchi.  Speaking in paragraph form, No distress, O2 sats 99% during exam  Abdominal: Soft. Bowel sounds are normal.  Musculoskeletal: Normal range of motion.  Reproducible pain of the inferior scapula with palpation, there is no noted deformity or step-off, freely moving the right arm without difficulty  Neurological: He is alert and oriented to person, place, and time.  Skin: Skin is warm and dry.  Psychiatric: He has a normal mood and affect.  Nursing note and vitals reviewed.    ED Treatments / Results  Labs (all labs ordered are listed, but only abnormal results are displayed) Labs Reviewed  BASIC METABOLIC PANEL - Abnormal; Notable for the following:       Result Value   Glucose, Bld 115 (*)    All other components within normal limits  CBC - Abnormal; Notable for the following:    WBC 10.8 (*)    All other components within normal limits  BRAIN NATRIURETIC PEPTIDE - Abnormal; Notable for the following:    B Natriuretic Peptide 535.5 (*)    All other components within normal limits  I-STAT TROPOININ, ED    EKG  EKG Interpretation  Date/Time:  Friday February 26 2017 13:04:22 EST Ventricular Rate:  131 PR Interval:    QRS Duration: 74 QT Interval:  322 QTC Calculation: 475 R Axis:   38 Text Interpretation:  Undetermined rhythm Nonspecific ST and T wave abnormality Confirmed by Ashok Cordia  MD, Lennette Bihari ([id number])  on 02/26/2017 1:15:30 PM       Radiology Dg Chest 2 View  Result Date: 02/26/2017 CLINICAL DATA:  Chest pain and shortness of breath  EXAM: CHEST  2 VIEW COMPARISON:  05/07/2015 FINDINGS: Status post CABG.Cardiomegaly and pulmonary vessel congestion. No notable vascular pedicle widening. Trace effusions. No air bronchogram. No pneumothorax. Glenohumeral arthroplasty. Chronic fracture of a left glenoid screw. IMPRESSION: Cardiomegaly, trace effusions, and pulmonary vascular congestion. Electronically Signed   By: Monte Fantasia M.D.   On: 02/26/2017 14:01    Procedures Procedures (including critical care time)  Medications Ordered in ED Medications  furosemide (LASIX) injection 40 mg (not administered)     Initial Impression / Assessment and Plan / ED Course  I have reviewed the triage vital signs and the nursing notes.  Pertinent labs & imaging results that were available during my care of the patient were reviewed by me and considered in my medical decision making (see chart for details).  75 year old male here with shortness breath and chest pain, occurring intermittently over the past month. Has seen his PCP but sent here for further evaluation. He is afebrile and nontoxic. His vitals are stable. No recent illness. Exam is overall benign. EKG with some nonspecific ST changes, appears to have some PACs.  Lab work with elevated BNP at 535. Chest x-ray with cardiomegaly and pulmonary vascular congestion. Patient's symptoms are not necessarily concerning for PE. He had an alleged PE and was started on anticoagulants, however had to follow-up PE studies which were both normal. Has some posterior right shoulder pain, however this is been ongoing for the past year and is reproducible on exam. I feel this is likely musculoskeletal. He does appear to have worsening heart failure. No recent 2D echo.  Given his ongoing symptoms and chest pain, will admit.  Lasix ordered.  Will need  echocardiogram.  Discussed with hospitalist service-- they will admit for ongoing care.  Final Clinical Impressions(s) / ED Diagnoses   Final diagnoses:  SOB (shortness of breath)  Congestive heart failure, unspecified congestive heart failure chronicity, unspecified congestive heart failure type ([place])  Chest pain, unspecified type  Chronic right shoulder pain    New Prescriptions New Prescriptions   No medications on file     Larene Pickett, Hershal Coria 02/26/17 [place], MD 02/26/17 [phone]

## 2017-02-26 NOTE — ED Notes (Signed)
Pt. Requesting condom catheter.

## 2017-02-26 NOTE — ED Notes (Signed)
Admitting at bedside 

## 2017-02-26 NOTE — Progress Notes (Signed)
ANTICOAGULATION CONSULT NOTE - Initial Consult  Pharmacy Consult for Eliquis Indication: atrial fibrillation  Allergies  Allergen Reactions  . Codeine Nausea And Vomiting       . Zetia [Ezetimibe] Other (See Comments)    Myalgias    Patient Measurements: Height: 5\' 6"  (167.6 cm) Weight: 169 lb 6.4 oz (76.8 kg) IBW/kg (Calculated) : 63.8  Vital Signs: Temp: 97.3 F (36.3 C) (03/09 1722) Temp Source: Oral (03/09 1308) BP: 167/94 (03/09 1722) Pulse Rate: 93 (03/09 1722)  Assessment: 76 yo M presents on 3/9 with new onset Afib. Pharmacy consulted to start Eliquis. CBC stable, no s/s of bleed.  Goal of Therapy:  Monitor platelets by anticoagulation protocol: Yes   Plan:  Start Eliquis 5mg  PO BID  Monitor CBC, s/s of bleed  Elenor Quinones, PharmD, BCPS Clinical Pharmacist Pager [phone] 02/26/2017 7:03 PM

## 2017-02-26 NOTE — ED Notes (Signed)
Cardiology at bedside.

## 2017-02-26 NOTE — ED Notes (Signed)
EDP at bedside  

## 2017-02-26 NOTE — ED Triage Notes (Signed)
Pt was sent by his PCP Dr Brigitte Pulse with reports of SOB and left sided sharp chest pain that started about a month ago but the SOB is getting worse with exertion. He also reports pain to his right shoulder onset a few months ago and a headache.

## 2017-02-26 NOTE — Telephone Encounter (Signed)
01/27/2017 Myoview cancelled by pt.   I spoke with the pt and he complains of "hurting bad" in ribs and shoulder blades, "swimmy headed", SOB that started yesterday and has worsened today. The pt is short winded on the phone. The pt states he takes 3 steps and cannot catch his breath.  The pt also states he has felt "deathly sick" several times today. Currently the pt is at his work office and does not have access to check his BP and pulse.  I advised him that he needs to proceed to the ER at this time. Pt agreed with plan and will have his wife take him to the ER since she is with him.

## 2017-02-26 NOTE — Telephone Encounter (Signed)
New Message  Pts wife voiced she would like for nurse to give her a call due pt having trouble breathing.  Notice appt w/Scott Kathlen Mody and tried to get pt to come in today but pts wife voiced no they prefer to see MD and advised pt he is not here today.  Please f/u with pt

## 2017-02-27 ENCOUNTER — Inpatient Hospital Stay (HOSPITAL_COMMUNITY): Payer: Medicare Other

## 2017-02-27 DIAGNOSIS — E038 Other specified hypothyroidism: Secondary | ICD-10-CM

## 2017-02-27 DIAGNOSIS — R079 Chest pain, unspecified: Secondary | ICD-10-CM

## 2017-02-27 DIAGNOSIS — I509 Heart failure, unspecified: Secondary | ICD-10-CM

## 2017-02-27 DIAGNOSIS — R0600 Dyspnea, unspecified: Secondary | ICD-10-CM

## 2017-02-27 DIAGNOSIS — I10 Essential (primary) hypertension: Secondary | ICD-10-CM

## 2017-02-27 DIAGNOSIS — E039 Hypothyroidism, unspecified: Secondary | ICD-10-CM

## 2017-02-27 DIAGNOSIS — G8929 Other chronic pain: Secondary | ICD-10-CM

## 2017-02-27 DIAGNOSIS — E7800 Pure hypercholesterolemia, unspecified: Secondary | ICD-10-CM

## 2017-02-27 DIAGNOSIS — I4891 Unspecified atrial fibrillation: Secondary | ICD-10-CM

## 2017-02-27 DIAGNOSIS — I2510 Atherosclerotic heart disease of native coronary artery without angina pectoris: Secondary | ICD-10-CM

## 2017-02-27 DIAGNOSIS — I739 Peripheral vascular disease, unspecified: Secondary | ICD-10-CM

## 2017-02-27 DIAGNOSIS — M25511 Pain in right shoulder: Secondary | ICD-10-CM

## 2017-02-27 DIAGNOSIS — Z951 Presence of aortocoronary bypass graft: Secondary | ICD-10-CM

## 2017-02-27 LAB — ECHOCARDIOGRAM COMPLETE
HEIGHTINCHES: 66 in
WEIGHTICAEL: 2638.4 [oz_av]

## 2017-02-27 LAB — TROPONIN I: TROPONIN I: 0.09 ng/mL — AB (ref <0.03)

## 2017-02-27 MED ORDER — CALCIUM CARBONATE ANTACID 500 MG PO CHEW
1.0000 | CHEWABLE_TABLET | Freq: Three times a day (TID) | ORAL | Status: DC | PRN
  Administered 2017-02-28: 200 mg via ORAL
  Filled 2017-02-27 (×2): qty 1

## 2017-02-27 MED ORDER — LORAZEPAM 0.5 MG PO TABS
0.5000 mg | ORAL_TABLET | Freq: Once | ORAL | Status: AC
  Administered 2017-02-27: 0.5 mg via ORAL
  Filled 2017-02-27: qty 1

## 2017-02-27 MED ORDER — LEVOTHYROXINE SODIUM 25 MCG PO TABS
25.0000 ug | ORAL_TABLET | Freq: Every day | ORAL | Status: DC
  Administered 2017-02-27 – 2017-03-01 (×3): 25 ug via ORAL
  Filled 2017-02-27 (×3): qty 1

## 2017-02-27 NOTE — Progress Notes (Signed)
Patient with stable vitals 7 a to 7 p, rate controlled in 70's and 80's, highest 104 with ambulation.  Patient walked in hallway multiple times during shift.  Does c/o pain in the right shoulder blade when he walks, mild pain but he says this is similar to the pain he had prior to admission that could be severe at times when walking.  Did not require any pain medication.  Patient had multiple family members in to visit, wife at bedside majority of the shift.

## 2017-02-27 NOTE — Progress Notes (Signed)
Informed by CCMD that pt is back on NSR.

## 2017-02-27 NOTE — Plan of Care (Signed)
Problem: Activity: Goal: Risk for activity intolerance will decrease Outcome: Progressing Walks in hall without increased shortness of breath   Problem: Fluid Volume: Goal: Ability to maintain a balanced intake and output will improve Outcome: Progressing Continues to diurese

## 2017-02-27 NOTE — Progress Notes (Signed)
  Echocardiogram 2D Echocardiogram has been performed.  Jennette Dubin 02/27/2017, 9:13 AM

## 2017-02-27 NOTE — Progress Notes (Signed)
[facility] PROGRESS NOTE  Ryan [name] YHC:[id number] DOB: Jan 19, 1941 DOA: 02/26/2017   PCP: Marton Redwood, MD     Assessment/Plan: Principal Problem:   Dyspnea Active Problems:   HTN (hypertension)   Pain in the chest   Coronary artery disease involving native coronary artery of native heart without angina pectoris   S/P CABG x 4   PAD (peripheral artery disease) (HCC)   HLD (hyperlipidemia)   Shoulder blade pain   SOB (shortness of breath)    76 y.o.malewith a hx of CAD s/p CABG in 03/2015, carotid artery disease s/p L CEA with Dr. Kellie Simmering in 5/17 , HTN and HLD, presenting to the ED with complaint of dyspnea. BNP and CXR findings c/w acute CHF. EKG and telemetry also demonstrate new onset atrial fibrillation  Assessment and plan Acute CHF exacerbation Could be secondary to new onset AFib and / or CHF exacerbation - BNP is elevated at 535.5 and chest Xray showed trace pleural effusions and chest x-ray with pulmonary vascular congestion Cardiac enzymes, borderline abnormal, continues to be in paroxysmal A. fib on telemetry,   CT angiogram  negative for PE, no evidence of dissection, 4 mm subpleural right pulmonary nodule seen by cardiology, started on IV Lasix, Continue Lasix 40 mg IV bid  TSH 11.473  Hypertension Continue home medications and adjust doses if needed  Hyperlipidemia Continue Zetia and consider statin therapy  New-onset atrial fibrillation 2-D echo pending Cardiology consult requested, patient most likely will require systemic anticoagulation for primary stroke prevention Started on amiodarone 400 mg twice a day, continue Coreg TEE/cardioversion Monday if stable  Right shoulder blade pain Continue pain management, and muscle relaxer  Abnormal TSH  Will start synthroid low dose   DVT prophylaxsis eliquis   Code Status:  Full code    Family Communication: Discussed in detail with the patient, all imaging results, lab results  explained to the patient   Disposition Plan:         Consultants:   cardiology  Procedures:   none   Antibiotics: Anti-infectives    None         HPI/Subjective: Feeling much better with respect to breathing. Denies chest pain. No longer has right scapular pain but that appears to be his primary concern.  Objective: Vitals:   02/26/17 1722 02/26/17 2034 02/27/17 0655 02/27/17 0900  BP: (!) 167/94 (!) 171/97 124/65 112/74  Pulse: 93 98 87 (!) 104  Resp: 18 18 18 18   Temp: 97.3 F (36.3 C) 97.5 F (36.4 C) 97.5 F (36.4 C) 98.6 F (37 C)  TempSrc:  Oral Oral Oral  SpO2: 96% 98% 97% 94%  Weight: 76.8 kg (169 lb 6.4 oz)  74.8 kg (164 lb 14.4 oz)   Height: 5\' 6"  (1.676 m)       Intake/Output Summary (Last 24 hours) at 02/27/17 0954 Last data filed at 02/27/17 0900  Gross per 24 hour  Intake              240 ml  Output             1650 ml  Net            -1410 ml    Exam:  Examination:  General exam: Appears calm and comfortable  Respiratory system: Clear to auscultation. Respiratory effort normal. Cardiovascular system: S1 & S2 heard, RRR. No JVD, murmurs, rubs, gallops or clicks. No pedal edema. Gastrointestinal system: Abdomen is nondistended, soft and  nontender. No organomegaly or masses felt. Normal bowel sounds heard. Central nervous system: Alert and oriented. No focal neurological deficits. Extremities: Symmetric 5 x 5 power. Skin: No rashes, lesions or ulcers Psychiatry: Judgement and insight appear normal. Mood & affect appropriate.     Data Reviewed: I have personally reviewed following labs and imaging studies  Micro Results No results found for this or any previous visit (from the past 240 hour(s)).  Radiology Reports Dg Chest 2 View  Result Date: 02/26/2017 CLINICAL DATA:  Chest pain and shortness of breath EXAM: CHEST  2 VIEW COMPARISON:  05/07/2015 FINDINGS: Status post CABG.Cardiomegaly and pulmonary vessel congestion. No notable  vascular pedicle widening. Trace effusions. No air bronchogram. No pneumothorax. Glenohumeral arthroplasty. Chronic fracture of a left glenoid screw. IMPRESSION: Cardiomegaly, trace effusions, and pulmonary vascular congestion. Electronically Signed   By: Monte Fantasia M.D.   On: 02/26/2017 14:01   Ct Angio Chest Aorta W/cm &/or Wo/cm  Result Date: 02/26/2017 CLINICAL DATA:  Intermittent chest pain shortness of breath. Intermittent back pain. Clinical concern for aortic dissection. EXAM: CT ANGIOGRAPHY CHEST WITH CONTRAST TECHNIQUE: Multidetector CT imaging of the chest was performed using the standard protocol during bolus administration of intravenous contrast. Multiplanar CT image reconstructions and MIPs were obtained to evaluate the vascular anatomy. CONTRAST:  100 cc Isovue 370 COMPARISON:  10/19/2016 FINDINGS: Cardiovascular: Heart size is enlarged no pericardial effusion. Patient is status post CABG. Precontrast imaging shows no hyperdense crescent in the wall of the thoracic aorta to suggest acute intramural hematoma. Imaging after IV contrast administration shows no dissection flap in the lumen of the thoracic aorta. Bovine arch vessel anatomy is evident with patency of the arch vessels noted. There is no evidence for large central pulmonary embolus. No lobar pulmonary embolus. No segmental pulmonary embolic disease is identified. Mediastinum/Nodes: No mediastinal lymphadenopathy. There is no hilar lymphadenopathy. The esophagus has normal imaging features. There is no axillary lymphadenopathy. Lungs/Pleura: Calcified granuloma right middle lobe 4 mm subpleural right middle lobe pulmonary nodule seen image 90 series a. there is some compressive atelectasis in the lower lobes bilaterally. Small bilateral pleural effusions are evident. Upper Abdomen: 5 mm nonobstructing stone identified upper pole right kidney 8 mm low-density lesion in the medial segment left liver is unchanged. Musculoskeletal: Bone  windows reveal no worrisome lytic or sclerotic osseous lesions. Patient is status post left shoulder replacement. Review of the MIP images confirms the above findings. IMPRESSION: 1. No evidence for thoracic aortic dissection. 2. No large central pulmonary embolus. 3. Bilateral lower lobe atelectasis with small bilateral pleural effusions. 4. 4 mm subpleural right middle lobe pulmonary nodule. If the patient is at high risk for bronchogenic carcinoma, follow-up chest CT at 1 year is recommended. If the patient is at low risk, no follow-up is needed. This recommendation follows the consensus statement: Guidelines for Management of Small Pulmonary Nodules Detected on CT Scans: A Statement from the [place] as published in Radiology 2005; 237:395-400. 5. 5 mm nonobstructing right renal stone. Electronically Signed   By: Misty Stanley M.D.   On: 02/26/2017 18:33     CBC  Recent Labs Lab 02/26/17 1209 02/26/17 1742  WBC 10.8* 9.9  HGB 15.9 15.8  HCT 47.3 47.2  PLT 244 265  MCV 93.8 93.5  MCH 31.5 31.3  MCHC 33.6 33.5  RDW 13.4 13.1    Chemistries   Recent Labs Lab 02/26/17 1209 02/26/17 1742  NA 138  --   K 3.9  --  CL 106  --   CO2 22  --   GLUCOSE 115*  --   BUN 10  --   CREATININE 1.00 1.06  CALCIUM 9.1  --   MG  --  2.0   ------------------------------------------------------------------------------------------------------------------ estimated creatinine clearance is 54.3 mL/min (by C-G formula based on SCr of 1.06 mg/dL). ------------------------------------------------------------------------------------------------------------------ No results for input(s): HGBA1C in the last 72 hours. ------------------------------------------------------------------------------------------------------------------ No results for input(s): CHOL, HDL, LDLCALC, TRIG, CHOLHDL, LDLDIRECT in the last 72  hours. ------------------------------------------------------------------------------------------------------------------  Recent Labs  02/26/17 1742  TSH 11.473*   ------------------------------------------------------------------------------------------------------------------ No results for input(s): VITAMINB12, FOLATE, FERRITIN, TIBC, IRON, RETICCTPCT in the last 72 hours.  Coagulation profile No results for input(s): INR, PROTIME in the last 168 hours.  No results for input(s): DDIMER in the last 72 hours.  Cardiac Enzymes  Recent Labs Lab 02/26/17 1742 02/26/17 2232 02/27/17 0432  TROPONINI 0.07* 0.08* 0.09*   ------------------------------------------------------------------------------------------------------------------ Invalid input(s): POCBNP   CBG: No results for input(s): GLUCAP in the last 168 hours.     Studies: Dg Chest 2 View  Result Date: 02/26/2017 CLINICAL DATA:  Chest pain and shortness of breath EXAM: CHEST  2 VIEW COMPARISON:  05/07/2015 FINDINGS: Status post CABG.Cardiomegaly and pulmonary vessel congestion. No notable vascular pedicle widening. Trace effusions. No air bronchogram. No pneumothorax. Glenohumeral arthroplasty. Chronic fracture of a left glenoid screw. IMPRESSION: Cardiomegaly, trace effusions, and pulmonary vascular congestion. Electronically Signed   By: Monte Fantasia M.D.   On: 02/26/2017 14:01   Ct Angio Chest Aorta W/cm &/or Wo/cm  Result Date: 02/26/2017 CLINICAL DATA:  Intermittent chest pain shortness of breath. Intermittent back pain. Clinical concern for aortic dissection. EXAM: CT ANGIOGRAPHY CHEST WITH CONTRAST TECHNIQUE: Multidetector CT imaging of the chest was performed using the standard protocol during bolus administration of intravenous contrast. Multiplanar CT image reconstructions and MIPs were obtained to evaluate the vascular anatomy. CONTRAST:  100 cc Isovue 370 COMPARISON:  10/19/2016 FINDINGS: Cardiovascular:  Heart size is enlarged no pericardial effusion. Patient is status post CABG. Precontrast imaging shows no hyperdense crescent in the wall of the thoracic aorta to suggest acute intramural hematoma. Imaging after IV contrast administration shows no dissection flap in the lumen of the thoracic aorta. Bovine arch vessel anatomy is evident with patency of the arch vessels noted. There is no evidence for large central pulmonary embolus. No lobar pulmonary embolus. No segmental pulmonary embolic disease is identified. Mediastinum/Nodes: No mediastinal lymphadenopathy. There is no hilar lymphadenopathy. The esophagus has normal imaging features. There is no axillary lymphadenopathy. Lungs/Pleura: Calcified granuloma right middle lobe 4 mm subpleural right middle lobe pulmonary nodule seen image 90 series a. there is some compressive atelectasis in the lower lobes bilaterally. Small bilateral pleural effusions are evident. Upper Abdomen: 5 mm nonobstructing stone identified upper pole right kidney 8 mm low-density lesion in the medial segment left liver is unchanged. Musculoskeletal: Bone windows reveal no worrisome lytic or sclerotic osseous lesions. Patient is status post left shoulder replacement. Review of the MIP images confirms the above findings. IMPRESSION: 1. No evidence for thoracic aortic dissection. 2. No large central pulmonary embolus. 3. Bilateral lower lobe atelectasis with small bilateral pleural effusions. 4. 4 mm subpleural right middle lobe pulmonary nodule. If the patient is at high risk for bronchogenic carcinoma, follow-up chest CT at 1 year is recommended. If the patient is at low risk, no follow-up is needed. This recommendation follows the consensus statement: Guidelines for Management of Small Pulmonary Nodules Detected on CT Scans: A  Statement from the [place] as published in Radiology 2005; 237:395-400. 5. 5 mm nonobstructing right renal stone. Electronically Signed   By: Misty Stanley M.D.   On: 02/26/2017 18:33      Lab Results  Component Value Date   HGBA1C 6.4 (H) 03/20/2015   Lab Results  Component Value Date   LDLCALC 138 (H) 01/20/2017   CREATININE 1.06 02/26/2017       Scheduled Meds: . amiodarone  400 mg Oral BID  . apixaban  5 mg Oral BID  . aspirin  81 mg Oral Daily  . carvedilol  3.125 mg Oral Daily  . docusate sodium  100 mg Oral BID  . ezetimibe  10 mg Oral Daily  . furosemide  40 mg Intravenous BID  . sodium chloride flush  3 mL Intravenous Q12H  . tamsulosin  0.4 mg Oral Daily   Continuous Infusions:   LOS: 1 day    Time spent: >30 MINS    [facility]  [facility] Pager [phone]. If 7PM-7AM, please contact night-coverage at [email or web address], password [email or web address] 02/27/2017, 9:54 AM  LOS: 1 day

## 2017-02-27 NOTE — Discharge Instructions (Signed)

## 2017-02-27 NOTE — Progress Notes (Signed)
Progress Note  Patient Name: Ryan [name] Date of Encounter: 02/27/2017  Primary Cardiologist: [name]  Subjective   Feeling much better with respect to breathing. Denies chest pain. No longer has right scapular pain but that appears to be his primary concern. CT did not show PE or aortic dissection, only a RML nodule.  Inpatient Medications    Scheduled Meds: . amiodarone  400 mg Oral BID  . apixaban  5 mg Oral BID  . aspirin  81 mg Oral Daily  . carvedilol  3.125 mg Oral Daily  . docusate sodium  100 mg Oral BID  . ezetimibe  10 mg Oral Daily  . furosemide  40 mg Intravenous BID  . sodium chloride flush  3 mL Intravenous Q12H  . tamsulosin  0.4 mg Oral Daily   Continuous Infusions:  PRN Meds: acetaminophen **OR** acetaminophen, HYDROcodone-acetaminophen, levalbuterol, methocarbamol, nitroGLYCERIN, ondansetron **OR** ondansetron (ZOFRAN) IV   Vital Signs    Vitals:   02/26/17 1722 02/26/17 2034 02/27/17 0655 02/27/17 0900  BP: (!) 167/94 (!) 171/97 124/65 112/74  Pulse: 93 98 87 (!) 104  Resp: 18 18 18 18   Temp: 97.3 F (36.3 C) 97.5 F (36.4 C) 97.5 F (36.4 C) 98.6 F (37 C)  TempSrc:  Oral Oral Oral  SpO2: 96% 98% 97% 94%  Weight: 169 lb 6.4 oz (76.8 kg)  164 lb 14.4 oz (74.8 kg)   Height: 5\' 6"  (1.676 m)       Intake/Output Summary (Last 24 hours) at 02/27/17 1211 Last data filed at 02/27/17 0900  Gross per 24 hour  Intake              240 ml  Output             1650 ml  Net            -1410 ml   Filed Weights   02/26/17 1722 02/27/17 0655  Weight: 169 lb 6.4 oz (76.8 kg) 164 lb 14.4 oz (74.8 kg)    Telemetry    NSR, a fib, NSVT, PAC's - Personally Reviewed  ECG     - Personally Reviewed  Physical Exam   GEN: No acute distress.   Neck: No JVD Cardiac: Regular rate, irregular rhythm, no murmurs, rubs, or gallops.  Respiratory: Clear to auscultation bilaterally. GI: Soft, nontender, non-distended  MS: No edema; No deformity. Neuro:   Nonfocal  Psych: Normal affect   Labs    Chemistry Recent Labs Lab 02/26/17 1209 02/26/17 1742  NA 138  --   K 3.9  --   CL 106  --   CO2 22  --   GLUCOSE 115*  --   BUN 10  --   CREATININE 1.00 1.06  CALCIUM 9.1  --   GFRNONAA >60 >60  GFRAA >60 >60  ANIONGAP 10  --      Hematology Recent Labs Lab 02/26/17 1209 02/26/17 1742  WBC 10.8* 9.9  RBC 5.04 5.05  HGB 15.9 15.8  HCT 47.3 47.2  MCV 93.8 93.5  MCH 31.5 31.3  MCHC 33.6 33.5  RDW 13.4 13.1  PLT 244 265    Cardiac Enzymes Recent Labs Lab 02/26/17 1742 02/26/17 2232 02/27/17 0432  TROPONINI 0.07* 0.08* 0.09*    Recent Labs Lab 02/26/17 1315  TROPIPOC 0.06     BNP Recent Labs Lab 02/26/17 1209  BNP 535.5*     DDimer No results for input(s): DDIMER in the last 168 hours.  Radiology    Dg Chest 2 View  Result Date: 02/26/2017 CLINICAL DATA:  Chest pain and shortness of breath EXAM: CHEST  2 VIEW COMPARISON:  05/07/2015 FINDINGS: Status post CABG.Cardiomegaly and pulmonary vessel congestion. No notable vascular pedicle widening. Trace effusions. No air bronchogram. No pneumothorax. Glenohumeral arthroplasty. Chronic fracture of a left glenoid screw. IMPRESSION: Cardiomegaly, trace effusions, and pulmonary vascular congestion. Electronically Signed   By: Monte Fantasia M.D.   On: 02/26/2017 14:01   Ct Angio Chest Aorta W/cm &/or Wo/cm  Result Date: 02/26/2017 CLINICAL DATA:  Intermittent chest pain shortness of breath. Intermittent back pain. Clinical concern for aortic dissection. EXAM: CT ANGIOGRAPHY CHEST WITH CONTRAST TECHNIQUE: Multidetector CT imaging of the chest was performed using the standard protocol during bolus administration of intravenous contrast. Multiplanar CT image reconstructions and MIPs were obtained to evaluate the vascular anatomy. CONTRAST:  100 cc Isovue 370 COMPARISON:  10/19/2016 FINDINGS: Cardiovascular: Heart size is enlarged no pericardial effusion. Patient is status  post CABG. Precontrast imaging shows no hyperdense crescent in the wall of the thoracic aorta to suggest acute intramural hematoma. Imaging after IV contrast administration shows no dissection flap in the lumen of the thoracic aorta. Bovine arch vessel anatomy is evident with patency of the arch vessels noted. There is no evidence for large central pulmonary embolus. No lobar pulmonary embolus. No segmental pulmonary embolic disease is identified. Mediastinum/Nodes: No mediastinal lymphadenopathy. There is no hilar lymphadenopathy. The esophagus has normal imaging features. There is no axillary lymphadenopathy. Lungs/Pleura: Calcified granuloma right middle lobe 4 mm subpleural right middle lobe pulmonary nodule seen image 90 series a. there is some compressive atelectasis in the lower lobes bilaterally. Small bilateral pleural effusions are evident. Upper Abdomen: 5 mm nonobstructing stone identified upper pole right kidney 8 mm low-density lesion in the medial segment left liver is unchanged. Musculoskeletal: Bone windows reveal no worrisome lytic or sclerotic osseous lesions. Patient is status post left shoulder replacement. Review of the MIP images confirms the above findings. IMPRESSION: 1. No evidence for thoracic aortic dissection. 2. No large central pulmonary embolus. 3. Bilateral lower lobe atelectasis with small bilateral pleural effusions. 4. 4 mm subpleural right middle lobe pulmonary nodule. If the patient is at high risk for bronchogenic carcinoma, follow-up chest CT at 1 year is recommended. If the patient is at low risk, no follow-up is needed. This recommendation follows the consensus statement: Guidelines for Management of Small Pulmonary Nodules Detected on CT Scans: A Statement from the [place] as published in Radiology 2005; 237:395-400. 5. 5 mm nonobstructing right renal stone. Electronically Signed   By: Misty Stanley M.D.   On: 02/26/2017 18:33    Cardiac Studies    Echocardiogram report pending  Patient Profile     76 y.o. male with CAD and CABG and left CEA presenting with worsening shortness of breath and right scapular pain, found to be new onset rapid atrial fibrillation and acute CHF.  Assessment & Plan    1. Acute CHF: 2 month h/o exertional dyspnea. He denies any significant weight gain. No orthopnea/PND or LEE. BNP is abnormal at 535. CXR showed cardiomegaly, trace effusions, and pulmonary vascular congestion. He has been started on IV Lasix 40 mg bid with good output, 500 cc in bag and an additional 1.3 L in past 24 hours. Strict I/Os and daily weights. Monitor renal function, electrolytes and BP. Low sodium diet. Awaiting 2D echocardiogram interpretation to assess if systolic vs diastolic or combined. His atrial fibrillation is  likely contributing.    2. New Onset Atrial Fibrillation: 2 month h/o intermittent exertional dyspnea and dizziness. Rate is currently in the 90-110s with episodes of sinus rhythm. He denies palpitations. No resting dyspnea. Continue BB. He has been on low dose Coreg, 3.125 mg BID. We will either need to increase dose or consider switch to metoprolol depending upon echo findings.  This patien'ts CHA2DS2-VASc Score and unadjusted Ischemic Stroke Rate (% per year) is equal to 7.2 % stroke rate/year from a score of 5 (CHF, HTN, Age >75 and vascular disease). His renal function is normal. Will continue Eliquis 5 mg BID.  Since he has been symptomatic, may consider TEE/DCCV prior to d/c if no spontaneous conversion with amiodarone.  TSH is elevated (hypothyroidism) and has likely contributed to atrial fibrillation. I will start levothyroxine.  3. Right Scapular Pain: sharp in nature + dyspnea. No PE or aortic dissection by CT angiogram of chest. Does have RML nodule. Pain has resolved.  4. CAD: s/p CABG in 2016. With new onset CHF + new atrial arrthmia, we will need to consider ischemic evaluation. 2D echo also pending. For  now, continue medical therapy with ASA, BB and statin.   5. Carotid Artery Disease: s/p left CEA per Dr. Kellie Simmering 04/2015. Followed yearly by vascular surgery.  Most recent carotid doppler 11/2016 showed patent left carotid endarterectomy site w/o restenosis and <40% right ICA stenosis. Continue ASA and statin.   6. Hypothyroidism: TSH is elevated to 11 (hypothyroidism) and has likely contributed to atrial fibrillation. I will start levothyroxine 25 mcg daily.  Signed, Kate Sable, MD  02/27/2017, 12:11 PM

## 2017-02-28 ENCOUNTER — Inpatient Hospital Stay (HOSPITAL_COMMUNITY): Payer: Medicare Other

## 2017-02-28 DIAGNOSIS — I5021 Acute systolic (congestive) heart failure: Secondary | ICD-10-CM

## 2017-02-28 DIAGNOSIS — R0609 Other forms of dyspnea: Secondary | ICD-10-CM

## 2017-02-28 DIAGNOSIS — I200 Unstable angina: Secondary | ICD-10-CM

## 2017-02-28 DIAGNOSIS — M25531 Pain in right wrist: Secondary | ICD-10-CM

## 2017-02-28 DIAGNOSIS — I5023 Acute on chronic systolic (congestive) heart failure: Secondary | ICD-10-CM

## 2017-02-28 LAB — COMPREHENSIVE METABOLIC PANEL
ALT: 18 U/L (ref 17–63)
AST: 20 U/L (ref 15–41)
Albumin: 3.8 g/dL (ref 3.5–5.0)
Alkaline Phosphatase: 50 U/L (ref 38–126)
Anion gap: 10 (ref 5–15)
BUN: 30 mg/dL — ABNORMAL HIGH (ref 6–20)
CALCIUM: 9.1 mg/dL (ref 8.9–10.3)
CHLORIDE: 100 mmol/L — AB (ref 101–111)
CO2: 24 mmol/L (ref 22–32)
CREATININE: 1.73 mg/dL — AB (ref 0.61–1.24)
GFR calc Af Amer: 43 mL/min — ABNORMAL LOW (ref >60)
GFR, EST NON AFRICAN AMERICAN: 37 mL/min — AB (ref >60)
Glucose, Bld: 126 mg/dL — ABNORMAL HIGH (ref 65–99)
Potassium: 3.3 mmol/L — ABNORMAL LOW (ref 3.5–5.1)
Sodium: 134 mmol/L — ABNORMAL LOW (ref 135–145)
TOTAL PROTEIN: 7.3 g/dL (ref 6.5–8.1)
Total Bilirubin: 1.2 mg/dL (ref 0.3–1.2)

## 2017-02-28 LAB — T4, FREE: Free T4: 1.02 ng/dL (ref 0.61–1.12)

## 2017-02-28 LAB — URIC ACID: URIC ACID, SERUM: 9.9 mg/dL — AB (ref 4.4–7.6)

## 2017-02-28 MED ORDER — TAMSULOSIN HCL 0.4 MG PO CAPS
0.4000 mg | ORAL_CAPSULE | Freq: Every day | ORAL | Status: DC
  Administered 2017-02-28: 0.4 mg via ORAL
  Filled 2017-02-28: qty 1

## 2017-02-28 MED ORDER — COLCHICINE 0.6 MG PO TABS
0.6000 mg | ORAL_TABLET | Freq: Two times a day (BID) | ORAL | Status: DC
  Administered 2017-02-28 – 2017-03-01 (×3): 0.6 mg via ORAL
  Filled 2017-02-28 (×3): qty 1

## 2017-02-28 MED ORDER — FUROSEMIDE 40 MG PO TABS
40.0000 mg | ORAL_TABLET | Freq: Every day | ORAL | Status: DC
  Administered 2017-03-01: 40 mg via ORAL
  Filled 2017-02-28: qty 1

## 2017-02-28 MED ORDER — LORAZEPAM 0.5 MG PO TABS
0.5000 mg | ORAL_TABLET | Freq: Every evening | ORAL | Status: DC | PRN
  Administered 2017-02-28: 0.5 mg via ORAL
  Filled 2017-02-28: qty 1

## 2017-02-28 MED ORDER — POTASSIUM CHLORIDE CRYS ER 20 MEQ PO TBCR
60.0000 meq | EXTENDED_RELEASE_TABLET | Freq: Once | ORAL | Status: AC
  Administered 2017-02-28: 60 meq via ORAL
  Filled 2017-02-28: qty 3

## 2017-02-28 NOTE — Progress Notes (Signed)
Pt has having trouble sleeping gave ativan

## 2017-02-28 NOTE — Progress Notes (Addendum)
[facility] PROGRESS NOTE  Ryan [name]:[id number] DOB: Apr 29, 1941 DOA: 02/26/2017   PCP: Marton Redwood, MD     Assessment/Plan: Principal Problem:   Dyspnea Active Problems:   HTN (hypertension)   Chest pain   Coronary artery disease involving native coronary artery of native heart without angina pectoris   S/P CABG x 4   PAD (peripheral artery disease) (HCC)   HLD (hyperlipidemia)   Shoulder blade pain   SOB (shortness of breath)   Hypothyroidism   Rapid atrial fibrillation ([place])    76 y.o.malewith a hx of CAD s/p CABG in 03/2015, carotid artery disease s/p L CEA with Dr. Kellie Simmering in 5/17 , HTN and HLD, presenting to the ED with complaint of dyspnea. BNP and CXR findings c/w acute CHF. EKG and telemetry also demonstrate new onset atrial fibrillation  Assessment and plan Acute combined systolic/diastolic CHF exacerbation Could be secondary to new onset AFib and / or CHF exacerbation - BNP is elevated at 535.5 and chest Xray showed trace pleural effusions and chest x-ray with pulmonary vascular congestion Cardiac enzymes, borderline abnormal, patient now in normal sinus rhythm,   CT angiogram  negative for PE, no evidence of dissection, 4 mm subpleural right pulmonary nodule seen by diuresis per cardiology, cr up today, dc Lasix 40 mg IV bid, switched to PO lasix today   SH 11.473 EF of 45%, on 2-D echo  Coronary artery disease s/p CABG in 2016. With new onset CHF + new atrial arrthmia, we will need to consider ischemic evaluation, deferred  to cardiology, continue  ASA, BB and statin  Hypertension Could potentially increase the dose of Coreg, as blood pressure allows  Hyperlipidemia Continue Zetia and consider statin therapy  New-onset atrial fibrillation 2-D echo shows EF of 45% Cardiology consulted, started on systemic anticoagulation for primary stroke prevention Started on amiodarone 400 mg twice a day, continue Coreg CHA2DS2-VASc Score and  unadjusted Ischemic Stroke Rate (% per year) is equal to 7.2 % stroke rate/year from a score of 5 Patient appears to be normal sinus rhythm today   Right shoulder blade pain Continue pain management, and muscle relaxer  Abnormal TSH  Will start synthroid low dose  Check free T4  Right wrist pain/acute gout flare  Patient has a history of gout, check wrist x-rays,uric acid 9.9 We'll start patient on colchicine  Urinary retention Does not take rapaflo anymore  He takes flomax instead   DVT prophylaxsis eliquis   Code Status:  Full code    Family Communication: Discussed in detail with the patient, all imaging results, lab results explained to the patient   Disposition Plan:   As per cardiology      Consultants:   cardiology  Procedures:   none   Antibiotics: Anti-infectives    None         HPI/Subjective: Complaining of right wrist pain  Objective: Vitals:   02/27/17 0900 02/27/17 1330 02/27/17 1954 02/28/17 0526  BP: 112/74 (!) 102/59 112/69 (!) 98/53  Pulse: (!) 104 87 83 79  Resp: 18 20 18 18   Temp: 98.6 F (37 C) 97.6 F (36.4 C) 97.3 F (36.3 C) 97.8 F (36.6 C)  TempSrc: Oral Oral Oral Oral  SpO2: 94% 94% 96% 98%  Weight:    75.6 kg (166 lb 11.2 oz)  Height:        Intake/Output Summary (Last 24 hours) at 02/28/17 2505 Last data filed at 02/28/17 0810  Gross per  24 hour  Intake              840 ml  Output             1375 ml  Net             -535 ml    Exam:  Examination:  General exam: Appears calm and comfortable  Respiratory system: Clear to auscultation. Respiratory effort normal. Cardiovascular system: S1 & S2 heard, RRR. No JVD, murmurs, rubs, gallops or clicks. No pedal edema. Gastrointestinal system: Abdomen is nondistended, soft and nontender. No organomegaly or masses felt. Normal bowel sounds heard. Central nervous system: Alert and oriented. No focal neurological deficits. Extremities: Symmetric 5 x 5 power. Skin:  No rashes, lesions or ulcers Psychiatry: Judgement and insight appear normal. Mood & affect appropriate.     Data Reviewed: I have personally reviewed following labs and imaging studies  Micro Results No results found for this or any previous visit (from the past 240 hour(s)).  Radiology Reports Dg Chest 2 View  Result Date: 02/26/2017 CLINICAL DATA:  Chest pain and shortness of breath EXAM: CHEST  2 VIEW COMPARISON:  05/07/2015 FINDINGS: Status post CABG.Cardiomegaly and pulmonary vessel congestion. No notable vascular pedicle widening. Trace effusions. No air bronchogram. No pneumothorax. Glenohumeral arthroplasty. Chronic fracture of a left glenoid screw. IMPRESSION: Cardiomegaly, trace effusions, and pulmonary vascular congestion. Electronically Signed   By: Monte Fantasia M.D.   On: 02/26/2017 14:01   Ct Angio Chest Aorta W/cm &/or Wo/cm  Result Date: 02/26/2017 CLINICAL DATA:  Intermittent chest pain shortness of breath. Intermittent back pain. Clinical concern for aortic dissection. EXAM: CT ANGIOGRAPHY CHEST WITH CONTRAST TECHNIQUE: Multidetector CT imaging of the chest was performed using the standard protocol during bolus administration of intravenous contrast. Multiplanar CT image reconstructions and MIPs were obtained to evaluate the vascular anatomy. CONTRAST:  100 cc Isovue 370 COMPARISON:  10/19/2016 FINDINGS: Cardiovascular: Heart size is enlarged no pericardial effusion. Patient is status post CABG. Precontrast imaging shows no hyperdense crescent in the wall of the thoracic aorta to suggest acute intramural hematoma. Imaging after IV contrast administration shows no dissection flap in the lumen of the thoracic aorta. Bovine arch vessel anatomy is evident with patency of the arch vessels noted. There is no evidence for large central pulmonary embolus. No lobar pulmonary embolus. No segmental pulmonary embolic disease is identified. Mediastinum/Nodes: No mediastinal lymphadenopathy.  There is no hilar lymphadenopathy. The esophagus has normal imaging features. There is no axillary lymphadenopathy. Lungs/Pleura: Calcified granuloma right middle lobe 4 mm subpleural right middle lobe pulmonary nodule seen image 90 series a. there is some compressive atelectasis in the lower lobes bilaterally. Small bilateral pleural effusions are evident. Upper Abdomen: 5 mm nonobstructing stone identified upper pole right kidney 8 mm low-density lesion in the medial segment left liver is unchanged. Musculoskeletal: Bone windows reveal no worrisome lytic or sclerotic osseous lesions. Patient is status post left shoulder replacement. Review of the MIP images confirms the above findings. IMPRESSION: 1. No evidence for thoracic aortic dissection. 2. No large central pulmonary embolus. 3. Bilateral lower lobe atelectasis with small bilateral pleural effusions. 4. 4 mm subpleural right middle lobe pulmonary nodule. If the patient is at high risk for bronchogenic carcinoma, follow-up chest CT at 1 year is recommended. If the patient is at low risk, no follow-up is needed. This recommendation follows the consensus statement: Guidelines for Management of Small Pulmonary Nodules Detected on CT Scans: A Statement from the [place]  as published in Radiology 2005; 237:395-400. 5. 5 mm nonobstructing right renal stone. Electronically Signed   By: Misty Stanley M.D.   On: 02/26/2017 18:33     CBC  Recent Labs Lab 02/26/17 1209 02/26/17 1742  WBC 10.8* 9.9  HGB 15.9 15.8  HCT 47.3 47.2  PLT 244 265  MCV 93.8 93.5  MCH 31.5 31.3  MCHC 33.6 33.5  RDW 13.4 13.1    Chemistries   Recent Labs Lab 02/26/17 1209 02/26/17 1742  NA 138  --   K 3.9  --   CL 106  --   CO2 22  --   GLUCOSE 115*  --   BUN 10  --   CREATININE 1.00 1.06  CALCIUM 9.1  --   MG  --  2.0   ------------------------------------------------------------------------------------------------------------------ estimated  creatinine clearance is 54.3 mL/min (by C-G formula based on SCr of 1.06 mg/dL). ------------------------------------------------------------------------------------------------------------------ No results for input(s): HGBA1C in the last 72 hours. ------------------------------------------------------------------------------------------------------------------ No results for input(s): CHOL, HDL, LDLCALC, TRIG, CHOLHDL, LDLDIRECT in the last 72 hours. ------------------------------------------------------------------------------------------------------------------  Recent Labs  02/26/17 1742  TSH 11.473*   ------------------------------------------------------------------------------------------------------------------ No results for input(s): VITAMINB12, FOLATE, FERRITIN, TIBC, IRON, RETICCTPCT in the last 72 hours.  Coagulation profile No results for input(s): INR, PROTIME in the last 168 hours.  No results for input(s): DDIMER in the last 72 hours.  Cardiac Enzymes  Recent Labs Lab 02/26/17 1742 02/26/17 2232 02/27/17 0432  TROPONINI 0.07* 0.08* 0.09*   ------------------------------------------------------------------------------------------------------------------ Invalid input(s): POCBNP   CBG: No results for input(s): GLUCAP in the last 168 hours.     Studies: Dg Chest 2 View  Result Date: 02/26/2017 CLINICAL DATA:  Chest pain and shortness of breath EXAM: CHEST  2 VIEW COMPARISON:  05/07/2015 FINDINGS: Status post CABG.Cardiomegaly and pulmonary vessel congestion. No notable vascular pedicle widening. Trace effusions. No air bronchogram. No pneumothorax. Glenohumeral arthroplasty. Chronic fracture of a left glenoid screw. IMPRESSION: Cardiomegaly, trace effusions, and pulmonary vascular congestion. Electronically Signed   By: Monte Fantasia M.D.   On: 02/26/2017 14:01   Ct Angio Chest Aorta W/cm &/or Wo/cm  Result Date: 02/26/2017 CLINICAL DATA:  Intermittent  chest pain shortness of breath. Intermittent back pain. Clinical concern for aortic dissection. EXAM: CT ANGIOGRAPHY CHEST WITH CONTRAST TECHNIQUE: Multidetector CT imaging of the chest was performed using the standard protocol during bolus administration of intravenous contrast. Multiplanar CT image reconstructions and MIPs were obtained to evaluate the vascular anatomy. CONTRAST:  100 cc Isovue 370 COMPARISON:  10/19/2016 FINDINGS: Cardiovascular: Heart size is enlarged no pericardial effusion. Patient is status post CABG. Precontrast imaging shows no hyperdense crescent in the wall of the thoracic aorta to suggest acute intramural hematoma. Imaging after IV contrast administration shows no dissection flap in the lumen of the thoracic aorta. Bovine arch vessel anatomy is evident with patency of the arch vessels noted. There is no evidence for large central pulmonary embolus. No lobar pulmonary embolus. No segmental pulmonary embolic disease is identified. Mediastinum/Nodes: No mediastinal lymphadenopathy. There is no hilar lymphadenopathy. The esophagus has normal imaging features. There is no axillary lymphadenopathy. Lungs/Pleura: Calcified granuloma right middle lobe 4 mm subpleural right middle lobe pulmonary nodule seen image 90 series a. there is some compressive atelectasis in the lower lobes bilaterally. Small bilateral pleural effusions are evident. Upper Abdomen: 5 mm nonobstructing stone identified upper pole right kidney 8 mm low-density lesion in the medial segment left liver is unchanged. Musculoskeletal: Bone windows reveal no worrisome lytic or sclerotic  osseous lesions. Patient is status post left shoulder replacement. Review of the MIP images confirms the above findings. IMPRESSION: 1. No evidence for thoracic aortic dissection. 2. No large central pulmonary embolus. 3. Bilateral lower lobe atelectasis with small bilateral pleural effusions. 4. 4 mm subpleural right middle lobe pulmonary nodule.  If the patient is at high risk for bronchogenic carcinoma, follow-up chest CT at 1 year is recommended. If the patient is at low risk, no follow-up is needed. This recommendation follows the consensus statement: Guidelines for Management of Small Pulmonary Nodules Detected on CT Scans: A Statement from the [place] as published in Radiology 2005; 237:395-400. 5. 5 mm nonobstructing right renal stone. Electronically Signed   By: Misty Stanley M.D.   On: 02/26/2017 18:33      Lab Results  Component Value Date   HGBA1C 6.4 (H) 03/20/2015   Lab Results  Component Value Date   LDLCALC 138 (H) 01/20/2017   CREATININE 1.06 02/26/2017       Scheduled Meds: . amiodarone  400 mg Oral BID  . apixaban  5 mg Oral BID  . aspirin  81 mg Oral Daily  . carvedilol  3.125 mg Oral Daily  . docusate sodium  100 mg Oral BID  . ezetimibe  10 mg Oral Daily  . furosemide  40 mg Intravenous BID  . levothyroxine  25 mcg Oral QAC breakfast  . sodium chloride flush  3 mL Intravenous Q12H  . tamsulosin  0.4 mg Oral Daily   Continuous Infusions:   LOS: 2 days    Time spent: >30 MINS    [facility]  [facility] Pager [phone]. If 7PM-7AM, please contact night-coverage at [email or web address], password [facility] 02/28/2017, 8:23 AM  LOS: 2 days

## 2017-02-28 NOTE — Progress Notes (Signed)
Pt is alert and up waling the halls upon report no distress, with foley bag intact. Pain in wrist rating at a 2 and 0 in back.

## 2017-02-28 NOTE — Plan of Care (Signed)
Problem: Education: Goal: Ability to demonstrate managment of disease process will improve Outcome: Progressing Reviewed living with heart failure booklet, discussed zone tool, importance of daily weights as well as correct way to perform daily weights and discussed a low sodium diet.  REviewed with patient what foods are high in sodium such as processed deli meats, cheese, and chips.

## 2017-02-28 NOTE — Progress Notes (Signed)
Pharmacy has reviewed this patient's chart for significant drug interactions. No significant drug interactions have been identified from the current list. Some things to consider:  - Aspirin and apixaban- Increased risk of bleeding. Patient has been educated.  - Amiodarone and B-blockers: Increased risk of bradycardia. Monitor HR. - Levothyroxine should be given on an empty stomach.  Thank you for allowing pharmacy to be a part of this patient's care.   Uvaldo Bristle, PharmD PGY1 Pharmacy Resident Pager: [phone]

## 2017-02-28 NOTE — Progress Notes (Signed)
Patient stable during 7 a to 7 p shift, VSS, remains on room air.  Patient states his wrist is improved since receiving Colchicine this morning.  Also tried the Robaxin for back pain and states that this did help.  Patient is anticipating discharge 03/01/17.  RN did instruct patient on heart failure, went over heart failure booklet and discussed in detail low sodium foods as patient states he and his wife eat out a lot.  Instructed on zone tool as well.

## 2017-02-28 NOTE — Progress Notes (Addendum)
Progress Note  Patient Name: Ryan [name] Date of Encounter: 02/28/2017  Primary Cardiologist: [name]  Subjective   Denies chest pain and shortness of breath. Continues to complain about right scapular pain and now right wrist pain. It is tender.  CT did not show PE or aortic dissection, only a RML nodule.  Inpatient Medications    Scheduled Meds: . amiodarone  400 mg Oral BID  . apixaban  5 mg Oral BID  . aspirin  81 mg Oral Daily  . carvedilol  3.125 mg Oral Daily  . colchicine  0.6 mg Oral BID  . docusate sodium  100 mg Oral BID  . ezetimibe  10 mg Oral Daily  . furosemide  40 mg Intravenous BID  . levothyroxine  25 mcg Oral QAC breakfast  . sodium chloride flush  3 mL Intravenous Q12H  . tamsulosin  0.4 mg Oral Daily   Continuous Infusions:  PRN Meds: acetaminophen **OR** acetaminophen, calcium carbonate, HYDROcodone-acetaminophen, levalbuterol, methocarbamol, nitroGLYCERIN, ondansetron **OR** ondansetron (ZOFRAN) IV   Vital Signs    Vitals:   02/27/17 1330 02/27/17 1954 02/28/17 0526 02/28/17 0948  BP: (!) 102/59 112/69 (!) 98/53 (!) 109/52  Pulse: 87 83 79 79  Resp: 20 18 18    Temp: 97.6 F (36.4 C) 97.3 F (36.3 C) 97.8 F (36.6 C)   TempSrc: Oral Oral Oral   SpO2: 94% 96% 98% 95%  Weight:   166 lb 11.2 oz (75.6 kg)   Height:        Intake/Output Summary (Last 24 hours) at 02/28/17 1039 Last data filed at 02/28/17 0810  Gross per 24 hour  Intake              720 ml  Output             1375 ml  Net             -655 ml   Filed Weights   02/26/17 1722 02/27/17 0655 02/28/17 0526  Weight: 169 lb 6.4 oz (76.8 kg) 164 lb 14.4 oz (74.8 kg) 166 lb 11.2 oz (75.6 kg)    Telemetry    NSR - Personally Reviewed  ECG     - Personally Reviewed  Physical Exam   GEN: No acute distress.   Neck: No JVD Cardiac: RRR, no murmurs, rubs, or gallops.  Respiratory: Clear to auscultation bilaterally. GI: Soft, nontender, non-distended  MS: No edema; No  deformity. Neuro:  Nonfocal  Psych: Normal affect   Labs    Chemistry Recent Labs Lab 02/26/17 1209 02/26/17 1742  NA 138  --   K 3.9  --   CL 106  --   CO2 22  --   GLUCOSE 115*  --   BUN 10  --   CREATININE 1.00 1.06  CALCIUM 9.1  --   GFRNONAA >60 >60  GFRAA >60 >60  ANIONGAP 10  --      Hematology Recent Labs Lab 02/26/17 1209 02/26/17 1742  WBC 10.8* 9.9  RBC 5.04 5.05  HGB 15.9 15.8  HCT 47.3 47.2  MCV 93.8 93.5  MCH 31.5 31.3  MCHC 33.6 33.5  RDW 13.4 13.1  PLT 244 265    Cardiac Enzymes Recent Labs Lab 02/26/17 1742 02/26/17 2232 02/27/17 0432  TROPONINI 0.07* 0.08* 0.09*    Recent Labs Lab 02/26/17 1315  TROPIPOC 0.06     BNP Recent Labs Lab 02/26/17 1209  BNP 535.5*     DDimer No results for  input(s): DDIMER in the last 168 hours.   Radiology    Dg Chest 2 View  Result Date: 02/26/2017 CLINICAL DATA:  Chest pain and shortness of breath EXAM: CHEST  2 VIEW COMPARISON:  05/07/2015 FINDINGS: Status post CABG.Cardiomegaly and pulmonary vessel congestion. No notable vascular pedicle widening. Trace effusions. No air bronchogram. No pneumothorax. Glenohumeral arthroplasty. Chronic fracture of a left glenoid screw. IMPRESSION: Cardiomegaly, trace effusions, and pulmonary vascular congestion. Electronically Signed   By: Monte Fantasia M.D.   On: 02/26/2017 14:01   Dg Wrist Complete Right  Result Date: 02/28/2017 CLINICAL DATA:  Acute right wrist pain.  No reported injury. EXAM: RIGHT WRIST - COMPLETE 3+ VIEW COMPARISON:  None. FINDINGS: No fracture or dislocation. No suspicious focal osseous lesion. Mild osteoarthritis at the first carpometacarpal joint. Mild-to-moderate osteoarthritis at the first metacarpophalangeal joint. No appreciable erosive arthropathy. Probable chondrocalcinosis in the triangular fibrocartilage. Vascular calcifications throughout the soft tissues. IMPRESSION: 1. No fracture or dislocation. 2. Mild to moderate  polyarticular osteoarthritis. 3. Probable chondrocalcinosis in the TFCC, suggesting CPPD arthropathy . Electronically Signed   By: Ilona Sorrel M.D.   On: 02/28/2017 09:12   Ct Angio Chest Aorta W/cm &/or Wo/cm  Result Date: 02/26/2017 CLINICAL DATA:  Intermittent chest pain shortness of breath. Intermittent back pain. Clinical concern for aortic dissection. EXAM: CT ANGIOGRAPHY CHEST WITH CONTRAST TECHNIQUE: Multidetector CT imaging of the chest was performed using the standard protocol during bolus administration of intravenous contrast. Multiplanar CT image reconstructions and MIPs were obtained to evaluate the vascular anatomy. CONTRAST:  100 cc Isovue 370 COMPARISON:  10/19/2016 FINDINGS: Cardiovascular: Heart size is enlarged no pericardial effusion. Patient is status post CABG. Precontrast imaging shows no hyperdense crescent in the wall of the thoracic aorta to suggest acute intramural hematoma. Imaging after IV contrast administration shows no dissection flap in the lumen of the thoracic aorta. Bovine arch vessel anatomy is evident with patency of the arch vessels noted. There is no evidence for large central pulmonary embolus. No lobar pulmonary embolus. No segmental pulmonary embolic disease is identified. Mediastinum/Nodes: No mediastinal lymphadenopathy. There is no hilar lymphadenopathy. The esophagus has normal imaging features. There is no axillary lymphadenopathy. Lungs/Pleura: Calcified granuloma right middle lobe 4 mm subpleural right middle lobe pulmonary nodule seen image 90 series a. there is some compressive atelectasis in the lower lobes bilaterally. Small bilateral pleural effusions are evident. Upper Abdomen: 5 mm nonobstructing stone identified upper pole right kidney 8 mm low-density lesion in the medial segment left liver is unchanged. Musculoskeletal: Bone windows reveal no worrisome lytic or sclerotic osseous lesions. Patient is status post left shoulder replacement. Review of the  MIP images confirms the above findings. IMPRESSION: 1. No evidence for thoracic aortic dissection. 2. No large central pulmonary embolus. 3. Bilateral lower lobe atelectasis with small bilateral pleural effusions. 4. 4 mm subpleural right middle lobe pulmonary nodule. If the patient is at high risk for bronchogenic carcinoma, follow-up chest CT at 1 year is recommended. If the patient is at low risk, no follow-up is needed. This recommendation follows the consensus statement: Guidelines for Management of Small Pulmonary Nodules Detected on CT Scans: A Statement from the [place] as published in Radiology 2005; 237:395-400. 5. 5 mm nonobstructing right renal stone. Electronically Signed   By: Misty Stanley M.D.   On: 02/26/2017 18:33    Cardiac Studies   Echocardiogam (02/27/17)  - Left ventricle: The cavity size was normal. Wall thickness was   increased in a pattern  of mild LVH. Systolic function was mildly   reduced. The estimated ejection fraction was 45%. The study was   not technically sufficient to allow evaluation of LV diastolic   dysfunction due to atrial fibrillation. - Regional wall motion abnormality: Akinesis of the mid   anteroseptal, mid inferoseptal, and basal inferior myocardium;   severe hypokinesis of the mid inferior myocardium. - Aortic valve: Trileaflet; mildly thickened, mildly calcified   leaflets. Morphologically, there is at least mild, if not   moderate aortic valve stenosis. - Left atrium: The atrium was moderately to severely dilated. - Right ventricle: Systolic function was severely reduced.  Patient Profile     76 y.o. male with CAD and CABG and left CEA presenting with worsening shortness of breath and right scapular pain, found to be new onset rapid atrial fibrillation and acute CHF.  Assessment & Plan    1. Acute systolic CHF, EF 12%: Has diuresed well on IV Lasix 40 mg bid. Will switch to oral 40 mg daily starting tomorrow (consider holding if  BUN/Cr still markedly elevated). Monitor renal function, electrolytes and BP. BUN/Cr elevated to 30/1.73. Low sodium diet. His atrial fibrillation likely contributed to this. Now in NSR on amiodarone and Coreg. No ACEI/ARB for now due to acute renal failure due to diuresis.  2. New Onset Atrial Fibrillation: 2 month h/o intermittent exertional dyspnea and dizziness. Now in sinus rhythm. Continue amiodarone and Coreg.  This patien'ts CHA2DS2-VASc Score and unadjusted Ischemic Stroke Rate (% per year) is equal to 7.2 % stroke rate/year from a score of 5(CHF, HTN, Age >75 and vascular disease). Will continue Eliquis 5 mg BID.  TSH was elevated (hypothyroidism) and has likely contributed to atrial fibrillation. I started levothyroxine on 3/10.  3. Right Scapular Pain: No PE or aortic dissection by CT angiogram of chest. Does have RML nodule. Likely musculoskeletal as it is tender to palpation.  4. CAD:s/p CABG in 2016. With new onset CHF (EF 45%) + new atrial arrthmia, we will need to consider ischemic evaluation. For now, continue medical therapy with ASA, BB, and Zetia. Statin intolerant.   5. Carotid Artery Disease: s/p left CEA per Dr. Kellie Simmering 04/2015. Followed yearly by vascular surgery. Most recent carotid doppler 11/2016 showed patent left carotid endarterectomy site w/o restenosis and <40% right ICA stenosis. Continue ASA.   6. Hypothyroidism: TSH is elevated to 11 (hypothyroidism) and has likely contributed to atrial fibrillation. I started levothyroxine on 3/10.  7. Aortic stenosis: Mild to moderate.  8. Acute renal failure: BUN/Cr elevated to 30/1.73, due to diuresis. I stopped IV Lasix and switched to oral 40 mg daily to start tomorrow. This can be held if BUN/Cr still markedly elevated.   Signed, Kate Sable, MD  02/28/2017, 10:39 AM

## 2017-03-01 ENCOUNTER — Other Ambulatory Visit: Payer: Self-pay | Admitting: Surgical

## 2017-03-01 DIAGNOSIS — R0602 Shortness of breath: Secondary | ICD-10-CM

## 2017-03-01 LAB — BASIC METABOLIC PANEL
ANION GAP: 8 (ref 5–15)
BUN: 33 mg/dL — ABNORMAL HIGH (ref 6–20)
CHLORIDE: 102 mmol/L (ref 101–111)
CO2: 27 mmol/L (ref 22–32)
Calcium: 9 mg/dL (ref 8.9–10.3)
Creatinine, Ser: 1.56 mg/dL — ABNORMAL HIGH (ref 0.61–1.24)
GFR, EST AFRICAN AMERICAN: 48 mL/min — AB (ref >60)
GFR, EST NON AFRICAN AMERICAN: 42 mL/min — AB (ref >60)
Glucose, Bld: 126 mg/dL — ABNORMAL HIGH (ref 65–99)
POTASSIUM: 4.3 mmol/L (ref 3.5–5.1)
Sodium: 137 mmol/L (ref 135–145)

## 2017-03-01 LAB — CBC
HCT: 42.2 % (ref 39.0–52.0)
Hemoglobin: 14 g/dL (ref 13.0–17.0)
MCH: 30.9 pg (ref 26.0–34.0)
MCHC: 33.2 g/dL (ref 30.0–36.0)
MCV: 93.2 fL (ref 78.0–100.0)
PLATELETS: 274 10*3/uL (ref 150–400)
RBC: 4.53 MIL/uL (ref 4.22–5.81)
RDW: 13.2 % (ref 11.5–15.5)
WBC: 10.7 10*3/uL — AB (ref 4.0–10.5)

## 2017-03-01 MED ORDER — AMIODARONE HCL 400 MG PO TABS: 400.0000 mg | ORAL_TABLET | Freq: Two times a day (BID) | ORAL | 0 refills | Status: DC

## 2017-03-01 MED ORDER — COLCHICINE 0.6 MG PO TABS: 0.6000 mg | ORAL_TABLET | Freq: Two times a day (BID) | ORAL | 0 refills | Status: DC

## 2017-03-01 MED ORDER — FUROSEMIDE 40 MG PO TABS: 40.0000 mg | ORAL_TABLET | Freq: Every day | ORAL | 1 refills | Status: DC

## 2017-03-01 MED ORDER — METHOCARBAMOL 500 MG PO TABS: 500.0000 mg | ORAL_TABLET | Freq: Three times a day (TID) | ORAL | 0 refills | Status: DC | PRN

## 2017-03-01 MED ORDER — FUROSEMIDE 40 MG PO TABS
40.0000 mg | ORAL_TABLET | Freq: Every day | ORAL | 1 refills | Status: DC
Start: 2017-03-01 — End: 2017-03-01

## 2017-03-01 MED ORDER — APIXABAN 5 MG PO TABS
5.0000 mg | ORAL_TABLET | Freq: Two times a day (BID) | ORAL | 2 refills | Status: DC
Start: 2017-03-01 — End: 2017-05-11

## 2017-03-01 MED ORDER — PANTOPRAZOLE SODIUM 40 MG PO TBEC: 40.0000 mg | DELAYED_RELEASE_TABLET | Freq: Every day | ORAL | 1 refills | Status: DC

## 2017-03-01 MED ORDER — LEVOTHYROXINE SODIUM 25 MCG PO TABS: 25.0000 ug | ORAL_TABLET | Freq: Every day | ORAL | 1 refills | Status: DC

## 2017-03-01 MED ORDER — TAMSULOSIN HCL 0.4 MG PO CAPS: 0.4000 mg | ORAL_CAPSULE | Freq: Every day | ORAL | 1 refills | Status: DC

## 2017-03-01 MED ORDER — APIXABAN 5 MG PO TABS: 5.0000 mg | ORAL_TABLET | Freq: Two times a day (BID) | ORAL | 2 refills | Status: DC

## 2017-03-01 NOTE — Progress Notes (Signed)
Progress Note  Patient Name: Ryan [name] Date of Encounter: 03/01/2017  Primary Cardiologist: Dr.[name]  Subjective   Feeling well. No chest pain, sob or palpitations.   Inpatient Medications    Scheduled Meds: . amiodarone  400 mg Oral BID  . apixaban  5 mg Oral BID  . aspirin  81 mg Oral Daily  . carvedilol  3.125 mg Oral Daily  . colchicine  0.6 mg Oral BID  . docusate sodium  100 mg Oral BID  . ezetimibe  10 mg Oral Daily  . furosemide  40 mg Oral Daily  . levothyroxine  25 mcg Oral QAC breakfast  . sodium chloride flush  3 mL Intravenous Q12H  . tamsulosin  0.4 mg Oral QPC supper   Continuous Infusions:  PRN Meds: acetaminophen **OR** acetaminophen, calcium carbonate, HYDROcodone-acetaminophen, levalbuterol, LORazepam, methocarbamol, nitroGLYCERIN, ondansetron **OR** ondansetron (ZOFRAN) IV   Vital Signs    Vitals:   02/28/17 0948 02/28/17 1200 02/28/17 2050 03/01/17 0540  BP: (!) 109/52 (!) 126/57 (!) 124/48 113/66  Pulse: 79 89 77 75  Resp:  20 20 20   Temp:  97.8 F (36.6 C) 98.3 F (36.8 C) 98.3 F (36.8 C)  TempSrc:  Oral Oral Oral  SpO2: 95% 98% 94% 97%  Weight:    167 lb 8 oz (76 kg)  Height:        Intake/Output Summary (Last 24 hours) at 03/01/17 0943 Last data filed at 03/01/17 0931  Gross per 24 hour  Intake             1080 ml  Output              900 ml  Net              180 ml   Filed Weights   02/27/17 0655 02/28/17 0526 03/01/17 0540  Weight: 164 lb 14.4 oz (74.8 kg) 166 lb 11.2 oz (75.6 kg) 167 lb 8 oz (76 kg)    Telemetry    NSR with few PVCs - Personally Reviewed  ECG    N/A  Physical Exam   GEN: No acute distress.   Neck: No JVD Cardiac: RRR, soft systolic  murmurs, rubs, or gallops.  Respiratory: Clear to auscultation bilaterally. GI: Soft, nontender, non-distended  MS: No edema; No deformity. Neuro:  Nonfocal  Psych: Normal affect   Labs    Chemistry Recent Labs Lab 02/26/17 1209 02/26/17 1742  02/28/17 0908 03/01/17 0522  NA 138  --  134* 137  K 3.9  --  3.3* 4.3  CL 106  --  100* 102  CO2 22  --  24 27  GLUCOSE 115*  --  126* 126*  BUN 10  --  30* 33*  CREATININE 1.00 1.06 1.73* 1.56*  CALCIUM 9.1  --  9.1 9.0  PROT  --   --  7.3  --   ALBUMIN  --   --  3.8  --   AST  --   --  20  --   ALT  --   --  18  --   ALKPHOS  --   --  50  --   BILITOT  --   --  1.2  --   GFRNONAA >60 >60 37* 42*  GFRAA >60 >60 43* 48*  ANIONGAP 10  --  10 8     Hematology Recent Labs Lab 02/26/17 1209 02/26/17 1742 03/01/17 0522  WBC 10.8* 9.9 10.7*  RBC 5.04  5.05 4.53  HGB 15.9 15.8 14.0  HCT 47.3 47.2 42.2  MCV 93.8 93.5 93.2  MCH 31.5 31.3 30.9  MCHC 33.6 33.5 33.2  RDW 13.4 13.1 13.2  PLT 244 265 274    Cardiac Enzymes Recent Labs Lab 02/26/17 1742 02/26/17 2232 02/27/17 0432  TROPONINI 0.07* 0.08* 0.09*    Recent Labs Lab 02/26/17 1315  TROPIPOC 0.06     BNP Recent Labs Lab 02/26/17 1209  BNP 535.5*     DDimer No results for input(s): DDIMER in the last 168 hours.   Radiology    Dg Wrist Complete Right  Result Date: 02/28/2017 CLINICAL DATA:  Acute right wrist pain.  No reported injury. EXAM: RIGHT WRIST - COMPLETE 3+ VIEW COMPARISON:  None. FINDINGS: No fracture or dislocation. No suspicious focal osseous lesion. Mild osteoarthritis at the first carpometacarpal joint. Mild-to-moderate osteoarthritis at the first metacarpophalangeal joint. No appreciable erosive arthropathy. Probable chondrocalcinosis in the triangular fibrocartilage. Vascular calcifications throughout the soft tissues. IMPRESSION: 1. No fracture or dislocation. 2. Mild to moderate polyarticular osteoarthritis. 3. Probable chondrocalcinosis in the TFCC, suggesting CPPD arthropathy . Electronically Signed   By: Ilona Sorrel M.D.   On: 02/28/2017 09:12    Cardiac Studies   Echo 02/27/17 Study Conclusions  - Left ventricle: The cavity size was normal. Wall thickness was   increased in a  pattern of mild LVH. Systolic function was mildly   reduced. The estimated ejection fraction was 45%. The study was   not technically sufficient to allow evaluation of LV diastolic   dysfunction due to atrial fibrillation. - Regional wall motion abnormality: Akinesis of the mid   anteroseptal, mid inferoseptal, and basal inferior myocardium;   severe hypokinesis of the mid inferior myocardium. - Aortic valve: Trileaflet; mildly thickened, mildly calcified   leaflets. Morphologically, there is at least mild, if not   moderate aortic valve stenosis. - Left atrium: The atrium was moderately to severely dilated. - Right ventricle: Systolic function was severely reduced.  Patient Profile     76 y.o. male with CAD and CABG and left CEA presenting with worsening shortness of breath and right scapular pain, found to be new onset rapid atrial fibrillation and acute CHF.  Assessment & Plan    1. Acute systolic CHF, EF 18% this admission - Net I & O negative 1.76L. Weight down 2lb (169-->167lb). Switched to po lasix 40mg  qd today. Scr improving.  -  His atrial fibrillation likely contributed to this. Now in NSR on amiodarone and Coreg.No ACEI/ARB for now due to acute renal failure due to diuresis.  - Euvolemic.   2. New Onset Atrial Fibrillation -  2 month h/o intermittent exertional dyspnea and dizziness. Now in sinus rhythm. Continue amiodarone and Coreg.  - This patien'ts CHA2DS2-VASc Score and unadjusted Ischemic Stroke Rate (% per year) is equal to 7.2 % stroke rate/year from a score of 5(CHF, HTN, Age >75 and vascular disease). Will continue Eliquis 5 mg BID.  - TSH was elevated (hypothyroidism) and has likely contributed to atrial fibrillation. Maintaining sinus rhythm.   3. CAD:s/p CABG in 2016. With new onset CHF (EF 45%) + new atrial arrthmia, we will need to consider ischemic evaluation. Likely as outpatient. No chest pain.  For now, continue medical therapy with ASA, BB, and Zetia.  Statin intolerant.   4. HLD - 06/08/2016: VLDL 15 01/20/2017: Cholesterol, Total 207; HDL 45; LDL Calculated 138; Triglycerides 121  - Consider lipid clinic referral. LDL goal less than 70.  5. Carotid Artery Disease s/p left CEA  - Followed by Dr. Kellie Simmering 04/2015. Most recent carotid doppler 11/2016 showed patent left carotid endarterectomy site w/o restenosis and <40% right ICA stenosis. Continue ASA.   6. Hypothyroidism - TSH is elevated to 11 (hypothyroidism) and has likely contributed to atrial fibrillation.  - Started levothyroxine on 3/10. Follow up with PCP.   7. Aortic stenosis -  Mild to moderate.  8. Acute renal failure - Improving  Dispo: Discharge later today. MD to see. Will need BEMT in week.     Jarrett Soho, PA  03/01/2017, 9:43 AM    Agree with note by Robbie Lis PA-C   Patient ready for discharge today. Admitted with congestive heart failure and new onset A. fib. He diuresed 1.7 L analysis back on oral medications. He did have PAF currently in sinus rhythm on oral anticoagulation and amiodarone. His exam is benign. Lungs are clear. He has no peripheral edema. Serum creatinine somewhat improving from 1.73 down to 1.56. Follow-Up with Dr. Cathie Olden as an Outpatient. The Patient Does Admit to Dietary Indiscretion with Regards to Salt.  Lorretta Harp, M.D., [place], [facility], [place] [address] [place], [place]  [address]  [phone] 03/01/2017 11:10 AM

## 2017-03-01 NOTE — Discharge Summary (Signed)
Physician Discharge Summary  Ryan [name] MRN: [id number] DOB/AGE: May 16, 1941 76 y.o.  PCP: Marton Redwood, MD   Admit date: 02/26/2017 Discharge date: 03/01/2017  Discharge Diagnoses:    Principal Problem:   Dyspnea Active Problems:   HTN (hypertension)   Chest pain   Coronary artery disease involving native coronary artery of native heart without angina pectoris   S/P CABG x 4   PAD (peripheral artery disease) (HCC)   HLD (hyperlipidemia)   Shoulder blade pain   SOB (shortness of breath)   Hypothyroidism   Rapid atrial fibrillation (HCC)   Wrist pain, acute, right   Acute systolic heart failure ([place])    Follow-up recommendations Follow-up with PCP in 3-5 days , including all  additional recommended appointments as below Follow-up CBC, CMP in 3 days Please check TSH, free T4 in 4 weeks Please provide referral to see urology, in the next one to 2 weeks Patient to follow-up with cardiology as scheduled      Current Discharge Medication List    START taking these medications   Details  amiodarone (PACERONE) 400 MG tablet Take 1 tablet (400 mg total) by mouth 2 (two) times daily. Qty: 60 tablet, Refills: 0    apixaban (ELIQUIS) 5 MG TABS tablet Take 1 tablet (5 mg total) by mouth 2 (two) times daily. Qty: 60 tablet, Refills: 2    colchicine 0.6 MG tablet Take 1 tablet (0.6 mg total) by mouth 2 (two) times daily. Qty: 20 tablet, Refills: 0    furosemide (LASIX) 40 MG tablet Take 1 tablet (40 mg total) by mouth daily. Qty: 30 tablet, Refills: 1    levothyroxine (SYNTHROID, LEVOTHROID) 25 MCG tablet Take 1 tablet (25 mcg total) by mouth daily before breakfast. Qty: 30 tablet, Refills: 1    tamsulosin (FLOMAX) 0.4 MG CAPS capsule Take 1 capsule (0.4 mg total) by mouth daily after supper. Qty: 30 capsule, Refills: 1      CONTINUE these medications which have CHANGED   Details  methocarbamol (ROBAXIN) 500 MG tablet Take 1 tablet (500 mg total) by mouth every 8  (eight) hours as needed for muscle spasms. Qty: 30 tablet, Refills: 0      CONTINUE these medications which have NOT CHANGED   Details  aspirin 81 MG chewable tablet Chew 81 mg by mouth daily.    carvedilol (COREG) 3.125 MG tablet Take 3.125 mg by mouth daily.    docusate sodium (COLACE) 100 MG capsule Take 100 mg by mouth 2 (two) times daily.    ezetimibe (ZETIA) 10 MG tablet Take 10 mg by mouth daily.    HYDROcodone-acetaminophen (NORCO/VICODIN) 5-325 MG tablet Take 1 tablet by mouth every 6 (six) hours as needed for moderate pain. Qty: 20 tablet, Refills: 0    naloxegol oxalate (MOVANTIK) 25 MG TABS tablet Take 1 tablet (25 mg total) by mouth daily. Qty: 30 tablet, Refills: 0    nitroGLYCERIN (NITROSTAT) 0.4 MG SL tablet Place 1 tablet (0.4 mg total) under the tongue every 5 (five) minutes as needed for chest pain. Qty: 25 tablet, Refills: 6  Protonix 40 mg by mouth daily     STOP taking these medications     RAPAFLO 8 MG CAPS capsule          Discharge Condition: *Stable   Discharge Instructions Get Medicines reviewed and adjusted: Please take all your medications with you for your next visit with your Primary MD  Please request your Primary MD to go over all hospital  tests and procedure/radiological results at the follow up, please ask your Primary MD to get all Hospital records sent to his/her office.  If you experience worsening of your admission symptoms, develop shortness of breath, life threatening emergency, suicidal or homicidal thoughts you must seek medical attention immediately by calling 911 or calling your MD immediately if symptoms less severe.  You must read complete instructions/literature along with all the possible adverse reactions/side effects for all the Medicines you take and that have been prescribed to you. Take any new Medicines after you have completely understood and accpet all the possible adverse reactions/side effects.   Do not drive  when taking Pain medications.   Do not take more than prescribed Pain, Sleep and Anxiety Medications  Special Instructions: If you have smoked or chewed Tobacco in the last 2 yrs please stop smoking, stop any regular Alcohol and or any Recreational drug use.  Wear Seat belts while driving.  Please note  You were cared for by a hospitalist during your hospital stay. Once you are discharged, your primary care physician will handle any further medical issues. Please note that NO REFILLS for any discharge medications will be authorized once you are discharged, as it is imperative that you return to your primary care physician (or establish a relationship with a primary care physician if you do not have one) for your aftercare needs so that they can reassess your need for medications and monitor your lab values.     Allergies  Allergen Reactions  . Codeine Nausea And Vomiting       . Zetia [Ezetimibe] Other (See Comments)    Myalgias       Disposition: 01-Home or Self Care   Consults:  Cardiology     Significant Diagnostic Studies:  Dg Chest 2 View  Result Date: 02/26/2017 CLINICAL DATA:  Chest pain and shortness of breath EXAM: CHEST  2 VIEW COMPARISON:  05/07/2015 FINDINGS: Status post CABG.Cardiomegaly and pulmonary vessel congestion. No notable vascular pedicle widening. Trace effusions. No air bronchogram. No pneumothorax. Glenohumeral arthroplasty. Chronic fracture of a left glenoid screw. IMPRESSION: Cardiomegaly, trace effusions, and pulmonary vascular congestion. Electronically Signed   By: Monte Fantasia M.D.   On: 02/26/2017 14:01   Dg Wrist Complete Right  Result Date: 02/28/2017 CLINICAL DATA:  Acute right wrist pain.  No reported injury. EXAM: RIGHT WRIST - COMPLETE 3+ VIEW COMPARISON:  None. FINDINGS: No fracture or dislocation. No suspicious focal osseous lesion. Mild osteoarthritis at the first carpometacarpal joint. Mild-to-moderate osteoarthritis at the first  metacarpophalangeal joint. No appreciable erosive arthropathy. Probable chondrocalcinosis in the triangular fibrocartilage. Vascular calcifications throughout the soft tissues. IMPRESSION: 1. No fracture or dislocation. 2. Mild to moderate polyarticular osteoarthritis. 3. Probable chondrocalcinosis in the TFCC, suggesting CPPD arthropathy . Electronically Signed   By: Ilona Sorrel M.D.   On: 02/28/2017 09:12   Ct Angio Chest Aorta W/cm &/or Wo/cm  Result Date: 02/26/2017 CLINICAL DATA:  Intermittent chest pain shortness of breath. Intermittent back pain. Clinical concern for aortic dissection. EXAM: CT ANGIOGRAPHY CHEST WITH CONTRAST TECHNIQUE: Multidetector CT imaging of the chest was performed using the standard protocol during bolus administration of intravenous contrast. Multiplanar CT image reconstructions and MIPs were obtained to evaluate the vascular anatomy. CONTRAST:  100 cc Isovue 370 COMPARISON:  10/19/2016 FINDINGS: Cardiovascular: Heart size is enlarged no pericardial effusion. Patient is status post CABG. Precontrast imaging shows no hyperdense crescent in the wall of the thoracic aorta to suggest acute intramural hematoma. Imaging after  IV contrast administration shows no dissection flap in the lumen of the thoracic aorta. Bovine arch vessel anatomy is evident with patency of the arch vessels noted. There is no evidence for large central pulmonary embolus. No lobar pulmonary embolus. No segmental pulmonary embolic disease is identified. Mediastinum/Nodes: No mediastinal lymphadenopathy. There is no hilar lymphadenopathy. The esophagus has normal imaging features. There is no axillary lymphadenopathy. Lungs/Pleura: Calcified granuloma right middle lobe 4 mm subpleural right middle lobe pulmonary nodule seen image 90 series a. there is some compressive atelectasis in the lower lobes bilaterally. Small bilateral pleural effusions are evident. Upper Abdomen: 5 mm nonobstructing stone identified upper  pole right kidney 8 mm low-density lesion in the medial segment left liver is unchanged. Musculoskeletal: Bone windows reveal no worrisome lytic or sclerotic osseous lesions. Patient is status post left shoulder replacement. Review of the MIP images confirms the above findings. IMPRESSION: 1. No evidence for thoracic aortic dissection. 2. No large central pulmonary embolus. 3. Bilateral lower lobe atelectasis with small bilateral pleural effusions. 4. 4 mm subpleural right middle lobe pulmonary nodule. If the patient is at high risk for bronchogenic carcinoma, follow-up chest CT at 1 year is recommended. If the patient is at low risk, no follow-up is needed. This recommendation follows the consensus statement: Guidelines for Management of Small Pulmonary Nodules Detected on CT Scans: A Statement from the [place] as published in Radiology 2005; 237:395-400. 5. 5 mm nonobstructing right renal stone. Electronically Signed   By: Misty Stanley M.D.   On: 02/26/2017 18:33    2-D echo LV EF: 45%  ------------------------------------------------------------------- Indications:      Atrial fibrillation - 427.31.  ------------------------------------------------------------------- History:   PMH:   Dyspnea.  Coronary artery disease.  Congestive heart failure.  PMH:   Myocardial infarction.  Risk factors: Former tobacco use. Hypertension. Dyslipidemia.  ------------------------------------------------------------------- Study Conclusions  - Left ventricle: The cavity size was normal. Wall thickness was   increased in a pattern of mild LVH. Systolic function was mildly   reduced. The estimated ejection fraction was 45%. The study was   not technically sufficient to allow evaluation of LV diastolic   dysfunction due to atrial fibrillation. - Regional wall motion abnormality: Akinesis of the mid   anteroseptal, mid inferoseptal, and basal inferior myocardium;   severe hypokinesis of the mid  inferior myocardium. - Aortic valve: Trileaflet; mildly thickened, mildly calcified   leaflets. Morphologically, there is at least mild, if not   moderate aortic valve stenosis. - Left atrium: The atrium was moderately to severely dilated. - Right ventricle: Systolic function was severely reduced.    Filed Weights   02/27/17 0655 02/28/17 0526 03/01/17 0540  Weight: 74.8 kg (164 lb 14.4 oz) 75.6 kg (166 lb 11.2 oz) 76 kg (167 lb 8 oz)     Microbiology: No results found for this or any previous visit (from the past 240 hour(s)).     Blood Culture No results found for: SDES, [place], CULT, REPTSTATUS    Labs: Results for orders placed or performed during the hospital encounter of 02/26/17 (from the past 48 hour(s))  Comprehensive metabolic panel     Status: Abnormal   Collection Time: 02/28/17  9:08 AM  Result Value Ref Range   Sodium 134 (L) 135 - 145 mmol/L   Potassium 3.3 (L) 3.5 - 5.1 mmol/L   Chloride 100 (L) 101 - 111 mmol/L   CO2 24 22 - 32 mmol/L   Glucose, Bld 126 (H) 65 - 99  mg/dL   BUN 30 (H) 6 - 20 mg/dL   Creatinine, Ser 1.73 (H) 0.61 - 1.24 mg/dL   Calcium 9.1 8.9 - 10.3 mg/dL   Total Protein 7.3 6.5 - 8.1 g/dL   Albumin 3.8 3.5 - 5.0 g/dL   AST 20 15 - 41 U/L   ALT 18 17 - 63 U/L   Alkaline Phosphatase 50 38 - 126 U/L   Total Bilirubin 1.2 0.3 - 1.2 mg/dL   GFR calc non Af Amer 37 (L) >60 mL/min   GFR calc Af Amer 43 (L) >60 mL/min    Comment: (NOTE) The eGFR has been calculated using the CKD EPI equation. This calculation has not been validated in all clinical situations. eGFR's persistently <60 mL/min signify possible Chronic Kidney Disease.    Anion gap 10 5 - 15  T4, free     Status: None   Collection Time: 02/28/17  9:08 AM  Result Value Ref Range   Free T4 1.02 0.61 - 1.12 ng/dL    Comment: (NOTE) Biotin ingestion may interfere with free T4 tests. If the results are inconsistent with the TSH level, previous test results, or  the clinical presentation, then consider biotin interference. If needed, order repeat testing after stopping biotin.   Uric acid     Status: Abnormal   Collection Time: 02/28/17  9:08 AM  Result Value Ref Range   Uric Acid, Serum 9.9 (H) 4.4 - 7.6 mg/dL  Basic metabolic panel     Status: Abnormal   Collection Time: 03/01/17  5:22 AM  Result Value Ref Range   Sodium 137 135 - 145 mmol/L   Potassium 4.3 3.5 - 5.1 mmol/L    Comment: DELTA CHECK NOTED   Chloride 102 101 - 111 mmol/L   CO2 27 22 - 32 mmol/L   Glucose, Bld 126 (H) 65 - 99 mg/dL   BUN 33 (H) 6 - 20 mg/dL   Creatinine, Ser 1.56 (H) 0.61 - 1.24 mg/dL   Calcium 9.0 8.9 - 10.3 mg/dL   GFR calc non Af Amer 42 (L) >60 mL/min   GFR calc Af Amer 48 (L) >60 mL/min    Comment: (NOTE) The eGFR has been calculated using the CKD EPI equation. This calculation has not been validated in all clinical situations. eGFR's persistently <60 mL/min signify possible Chronic Kidney Disease.    Anion gap 8 5 - 15  CBC     Status: Abnormal   Collection Time: 03/01/17  5:22 AM  Result Value Ref Range   WBC 10.7 (H) 4.0 - 10.5 K/uL   RBC 4.53 4.22 - 5.81 MIL/uL   Hemoglobin 14.0 13.0 - 17.0 g/dL   HCT 42.2 39.0 - 52.0 %   MCV 93.2 78.0 - 100.0 fL   MCH 30.9 26.0 - 34.0 pg   MCHC 33.2 30.0 - 36.0 g/dL   RDW 13.2 11.5 - 15.5 %   Platelets 274 150 - 400 K/uL     Lipid Panel     Component Value Date/Time   CHOL 207 (H) 01/20/2017 1051   TRIG 121 01/20/2017 1051   HDL 45 01/20/2017 1051   CHOLHDL 4.6 01/20/2017 1051   CHOLHDL 3.8 06/08/2016 0901   VLDL 15 06/08/2016 0901   LDLCALC 138 (H) 01/20/2017 1051     Lab Results  Component Value Date   HGBA1C 6.4 (H) 03/20/2015     Lab Results  Component Value Date   LDLCALC 138 (H) 01/20/2017   CREATININE 1.56 (  H) 03/01/2017     HPI :   76 y.o.malewith a hx of CAD s/p prior MIs, carotid artery disease, HTN, HL. He was treated with Xarelto for suspected pulmonary embolism  (Intermediate VQ scan) in 4/16. However, he returned to the hospital with recurrent chest pain in 4/16 and ruled in for a myocardial infarction. CT was neg for pulmonary embolism and a LHC demonstrated 3v CAD. EF was normal at 60%. He was also noted to have severe L carotid stenosis. He underwent CABG with L-LAD, S-D1/D2, S-PDA with Dr. Roxan Hockey. He subsequently underwent L CEA with Dr. Kellie Simmering in 5/17.  In August 2017, he underwent a lop cholecystectomy.   He presents now to the [facility] ED with a complaint of intermitted exertion dyspnea and dizziness, off and on, for the last 2 months. Improves with rest. No anterior chest pain, however he has had sharp right scapular pain. He had worsening symptoms today, prompting presentation to the ED.  BNP is 539. CXR shows Cardiomegaly, trace effusions, and pulmonary vascular congestion.  EKG and telemetry show new onset atrial fibrillation. HR in the 110s. BP is elevated in the ED. He denies palpations. No prior h/o stroke TIA.   HOSPITAL COURSE: *   Acute combined systolic/diastolic CHF exacerbation Could be secondary to new onset AFib and / or CHF exacerbation - BNP is elevated at 535.5 and chest Xray showed trace pleural effusions and chest x-ray with pulmonary vascular congestion Cardiac enzymes, borderline abnormal, patient now in normal sinus rhythm,   CT angiogram negative for PE, no evidence of dissection, 4 mm subpleural right pulmonary nodule seen , needs to be followed up by PCP. Patient initiated on diuresis per cardiology, cr up to 1.73, discontinued Lasix 40 mg IV bid, switched to PO lasix today , creatinine slowly trending down TSH 11.473 EF of 45%, on 2-D echo Low sodium diet. His atrial fibrillation likely contributed to this. Now in NSR on amiodarone and Coreg.No ACEI/ARB for now due to acute renal failure due to diuresis.   Coronary artery disease s/p CABG in 2016. With new onset CHF + new atrial arrthmia, we will need to consider  ischemic evaluation in the outpatient setting, deferred  to cardiology, continue ASA, BB and intolerant to statin  Hypertension Could potentially increase the dose of Coreg, as blood pressure allows  Hyperlipidemia Patient refused to take Zetia  due to myalgias  New-onset atrial fibrillation 2-D echo shows EF of 45% Cardiology consulted, started on systemic anticoagulation for primary stroke prevention Started on amiodarone 400 mg twice a day, continue Coreg CHA2DS2-VASc Score and unadjusted Ischemic Stroke Rate (% per year) is equal to 7.2 % stroke rate/year from a score of 5 Patient appears to be normal sinus rhythm today continue Eliquis 5 mg BID.  TSH was elevated (hypothyroidism) and has likely contributed to atrial fibrillation. Started on levothyroxine on 3/10.  Right shoulder blade pain Continue pain management, and muscle relaxer No PE or aortic dissection by CT angiogram of chest. Does have RML nodule. Likely musculoskeletal as it is tender to palpation.   Abnormal TSH -11.4 Will start synthroid low dose  Free T4 normal  Right wrist pain/acute gout flare  Patient has a history of gout, wrist x-ray no fracture, polyarticular arthritis, probable chondrocalcinosis,uric acid 9.9 Continue colchicine, will need allopurinol in the long-term  Urinary retention Resolved on the day of discharge  Was in urinary retention on  admission Does not take rapaflo anymore  He takes flomax instead, started on  Flomax Patient to follow-up with urology   Discharge Exam: *  Blood pressure 113/66, pulse 75, temperature 98.3 F (36.8 C), temperature source Oral, resp. rate 20, height '5\' 6"'  (1.676 m), weight 76 kg (167 lb 8 oz), SpO2 97 %.  GEN:No acute distress.   Neck:No JVD Cardiac:RRR, soft systolic  murmurs, rubs, or gallops.  Respiratory:Clear to auscultation bilaterally. MM:OCAR, nontender, non-distended  MS:No edema; No deformity. Neuro:Nonfocal  Psych:  Normal affect     Follow-up Information    Marton Redwood, MD. Call.   Specialty:  Internal Medicine Why:  Hospital follow-up, follow-up with PCP in 3-5 days Contact information: [place] [address] [phone]        Sherren Mocha, MD. Call.   Specialty:  Cardiology Why:  Hospital follow-up, call to confirm follow-up appointment Contact information: [address] [place] [address] [phone]           Signed: Reyne Dumas 03/01/2017, 8:17 AM        Time spent >45 mins

## 2017-03-02 ENCOUNTER — Ambulatory Visit: Payer: Medicare Other | Admitting: Physician Assistant

## 2017-03-05 ENCOUNTER — Telehealth: Payer: Self-pay | Admitting: Cardiovascular Disease

## 2017-03-05 DIAGNOSIS — Z6828 Body mass index (BMI) 28.0-28.9, adult: Secondary | ICD-10-CM | POA: Diagnosis not present

## 2017-03-05 DIAGNOSIS — I10 Essential (primary) hypertension: Secondary | ICD-10-CM | POA: Diagnosis not present

## 2017-03-05 DIAGNOSIS — I480 Paroxysmal atrial fibrillation: Secondary | ICD-10-CM | POA: Diagnosis not present

## 2017-03-05 DIAGNOSIS — I5020 Unspecified systolic (congestive) heart failure: Secondary | ICD-10-CM | POA: Diagnosis not present

## 2017-03-05 DIAGNOSIS — Z7901 Long term (current) use of anticoagulants: Secondary | ICD-10-CM | POA: Diagnosis not present

## 2017-03-05 DIAGNOSIS — J984 Other disorders of lung: Secondary | ICD-10-CM | POA: Diagnosis not present

## 2017-03-05 DIAGNOSIS — M898X1 Other specified disorders of bone, shoulder: Secondary | ICD-10-CM | POA: Diagnosis not present

## 2017-03-05 NOTE — Telephone Encounter (Signed)
Pt's wife returned your call please give her a call back.

## 2017-03-05 NOTE — Telephone Encounter (Signed)
Pt's wife calling stating that pt is unable to get his Amiodarone 400 mg tablet, because his pharmacy stated that medicare will not pay for it and that he needed a prior auth done. Pt's wife would like a call back. Please advise

## 2017-03-05 NOTE — Telephone Encounter (Signed)
Left message for patient's wife to call back.  

## 2017-03-08 ENCOUNTER — Telehealth: Payer: Self-pay | Admitting: Cardiovascular Disease

## 2017-03-08 MED ORDER — AMIODARONE HCL 200 MG PO TABS: 400.0000 mg | ORAL_TABLET | Freq: Two times a day (BID) | ORAL | 0 refills | Status: DC

## 2017-03-08 NOTE — Telephone Encounter (Signed)
Follow up     Pt wife is returning Michelles call from Friday

## 2017-03-08 NOTE — Telephone Encounter (Signed)
See telephone encounter dated 3/19

## 2017-03-08 NOTE — Telephone Encounter (Signed)
Spoke with patient who states he is out of amiodarone and the pharmacy advised he cannot get any additional medication. I reviewed the Rx and patient states he has not received any amiodarone since d/c from the hospital last week except what was given to him by the pharmacy as a loan. I advised I will call [facility] to discuss. I spoke with Lelon Frohlich at [facility] and she advised that the 200 mg amiodarone pills will be covered by insurance. I sent a new Rx for 40 tablets because patient has an upcoming appointment with Dr. Acie Fredrickson on Wed. 3/21. Patient is aware of the change in the Rx and thanked me for the call.

## 2017-03-10 ENCOUNTER — Ambulatory Visit (INDEPENDENT_AMBULATORY_CARE_PROVIDER_SITE_OTHER): Payer: Medicare Other | Admitting: Cardiovascular Disease

## 2017-03-10 ENCOUNTER — Encounter: Payer: Self-pay | Admitting: Cardiovascular Disease

## 2017-03-10 ENCOUNTER — Ambulatory Visit: Payer: Medicare Other | Admitting: Nurse Practitioner

## 2017-03-10 VITALS — BP 140/70 | HR 78 | Ht 66.0 in | Wt 172.2 lb

## 2017-03-10 DIAGNOSIS — I70211 Atherosclerosis of native arteries of extremities with intermittent claudication, right leg: Secondary | ICD-10-CM

## 2017-03-10 DIAGNOSIS — I480 Paroxysmal atrial fibrillation: Secondary | ICD-10-CM | POA: Diagnosis not present

## 2017-03-10 DIAGNOSIS — I2510 Atherosclerotic heart disease of native coronary artery without angina pectoris: Secondary | ICD-10-CM

## 2017-03-10 MED ORDER — AMIODARONE HCL 200 MG PO TABS: 200.0000 mg | ORAL_TABLET | Freq: Every day | ORAL | 3 refills | Status: DC

## 2017-03-10 NOTE — Patient Instructions (Signed)
Medication Instructions:  DECREASE Amiodarone to 200 mg twice daily x 1 week then decrease to 200 mg once daily   Labwork: None Ordered   Testing/Procedures: None Ordered   Follow-Up: Your physician recommends that you schedule a follow-up appointment in: 3 months with Dr. Acie Fredrickson   If you need a refill on your cardiac medications before your next appointment, please call your pharmacy.   Thank you for choosing [facility]! Christen Bame, RN [phone]

## 2017-03-10 NOTE — Progress Notes (Signed)
Problem List  1. CAD - CABG  2. Essential Hypertension 3. Hyperlipidemia 4. Left carotid arterectomy  5. Atrial fib  6. Gout    Ryan [name] is a 76 y.o. man  With a history of hyperlipidemia, coronary artery disease, hypothyroidism  He was admitted March 31 with CP and dyspnea.   VQ was intermediate.   Was clinically thought to have a PE  So he was DC'd on NOAC. Was readmitted several days later with STEMI and cath revealed severe CAD. CT angio on April 16 showed no evidence of pulmonary embolus.   Oct. 4, 2016:  Ryan [name] is doing well.  Occasional episodes of orthostatic hypotension Is eating some salty snacks. Has lots of hip pain with walking  Encouraged him to try a stationary bike or elliptical  Had peripheral arterial duplex scan ( ? At [facility]) that was reportedly normal.   March 17, 2016  Thought he had some symptoms of food poisoning - occurred after eating .  Also may have developed back issues related to taking the crestor also  He stopped the crestor and the symptoms have resolved.   June 24, 2016:  Ryan [name] is seen today .   Spent the weekend at [facility] - has a house there.  No CP or dyspnea.      March 10, 2017:  Ryan [name] is seen back after a recent hospitalization for new atrial fib .   Was started on amio and Eliquis  The echo shows an EF of 45%.  Moderate AS   Gets nausea after taking one of his meds.  Is now on Amio 400 BID,  Was also found to have gout  - was started on colchicine - feels better.  Is having some diarrhea from the colchicine   Is back in NSR   Medications Current Outpatient Prescriptions  Medication Sig Dispense Refill  . amiodarone (PACERONE) 200 MG tablet Take 2 tablets (400 mg total) by mouth 2 (two) times daily. 40 tablet 0  . apixaban (ELIQUIS) 5 MG TABS tablet Take 1 tablet (5 mg total) by mouth 2 (two) times daily. 60 tablet 2  . carvedilol (COREG) 3.125 MG tablet Take 3.125 mg by mouth daily.    . colchicine 0.6 MG  tablet Take 1 tablet (0.6 mg total) by mouth 2 (two) times daily. 20 tablet 0  . docusate sodium (COLACE) 100 MG capsule Take 100 mg by mouth 2 (two) times daily.    Marland Kitchen ezetimibe (ZETIA) 10 MG tablet Take 10 mg by mouth daily.    . furosemide (LASIX) 40 MG tablet Take 1 tablet (40 mg total) by mouth daily. 30 tablet 1  . HYDROcodone-acetaminophen (NORCO/VICODIN) 5-325 MG tablet Take 1 tablet by mouth every 6 (six) hours as needed for moderate pain. 20 tablet 0  . levothyroxine (SYNTHROID, LEVOTHROID) 25 MCG tablet Take 1 tablet (25 mcg total) by mouth daily before breakfast. 30 tablet 1  . methocarbamol (ROBAXIN) 500 MG tablet Take 1 tablet (500 mg total) by mouth every 8 (eight) hours as needed for muscle spasms. 30 tablet 0  . nitroGLYCERIN (NITROSTAT) 0.4 MG SL tablet Place 1 tablet (0.4 mg total) under the tongue every 5 (five) minutes as needed for chest pain. 25 tablet 6  . RAPAFLO 8 MG CAPS capsule Take 8 mg by mouth daily.     No current facility-administered medications for this visit.     PE: BP 140/70 (BP Location: Right Arm, Patient Position: Sitting, Cuff Size: Normal)  Pulse 78   Ht 5\' 6"  (1.676 m)   Wt 172 lb 3.2 oz (78.1 kg)   SpO2 99%   BMI 27.79 kg/m  HEENT:   No JVD, soft left carotid bruit,  s/p L CEA scar  Lungs: clear  Cor:  Regular rate.  Soft systolic murmur  Abd:  + BS ,  Ext , no clubbing cyanosis , no edema,  Skin , normal Psych:  Normal ,   Neuro: CN intact, walks without trouble   ECG : Ordered by by :     Lab Results:  No results for input(s): WBC, HGB, HCT, PLT in the last 72 hours. BMET No results for input(s): NA, K, CL, CO2, GLUCOSE, BUN, CREATININE, CALCIUM in the last 72 hours.  Assessment/Plan    1. Paroxysmal Atrial fib:   Presented with AF to the hospital   .   Converted on Amio Will reduce dose to Amio 200 mg BID for a week and then decrease to Amio 200 mg a day after this week  Will check TSH at next office visit  2. CAD: severe  multivessel disease. CABG      3. Hyperlipidemia - LDL = 97 on Crestor 5 mg M,W,F and Zetia 10 mg a day   Is having some nausea withone of his meds. He will hold several of his meds one at a time .    4.  Hypothyroidism:  Will need more frequent TSH checks since he is on Amio   5..  Essential hypertension-  Blood pressure is well controlled at this point .    6.  Gout :  I think the colchicine is causing some diarrhea. He will call [name] to get further recommendations on treatment of his gout He may need allopurinol   7. Nausea:   He has some nausea after taking his medications each day. He'll try to hold one medication at time to see if he can figure out which medicine seems to be causing this am. He thinks it's either the carvedilol or the Zetia   will see him in 3 months   Thayer Headings, Brooke Bonito., MD, [facility] 03/10/2017, 11:22 AM [address]  [place] Pager [phone]

## 2017-03-11 ENCOUNTER — Telehealth: Payer: Self-pay | Admitting: Cardiovascular Disease

## 2017-03-11 NOTE — Telephone Encounter (Signed)
New Message:  Almyra Free needs Dr Burt Knack to do a prior authorization for pt's Amiodarone please. She will fax the form for this authorization.Please call and give her your fax number,call Almyra Free at [phone].

## 2017-03-11 NOTE — Telephone Encounter (Signed)
**Note De-Identified [name] Obfuscation** I left a detailed message on Ryan [name] VM with our fax number so she can fax form to me and I left this offices phone number in case she has any questions.

## 2017-03-22 ENCOUNTER — Encounter: Payer: Self-pay | Admitting: Physician Assistant

## 2017-03-23 ENCOUNTER — Telehealth: Payer: Self-pay | Admitting: Cardiovascular Disease

## 2017-03-23 ENCOUNTER — Other Ambulatory Visit: Payer: Self-pay

## 2017-03-23 ENCOUNTER — Encounter (HOSPITAL_COMMUNITY): Payer: Self-pay | Admitting: Emergency Medicine

## 2017-03-23 ENCOUNTER — Emergency Department (HOSPITAL_COMMUNITY): Payer: Medicare Other

## 2017-03-23 ENCOUNTER — Inpatient Hospital Stay (HOSPITAL_COMMUNITY)
Admission: EM | Admit: 2017-03-23 | Discharge: 2017-03-26 | DRG: 872 | Disposition: A | Discharge status: Home / Self Care | Payer: Medicare Other | Attending: Internal Medicine | Admitting: Internal Medicine

## 2017-03-23 DIAGNOSIS — Z86718 Personal history of other venous thrombosis and embolism: Secondary | ICD-10-CM | POA: Diagnosis not present

## 2017-03-23 DIAGNOSIS — R6520 Severe sepsis without septic shock: Secondary | ICD-10-CM | POA: Diagnosis present

## 2017-03-23 DIAGNOSIS — I5042 Chronic combined systolic (congestive) and diastolic (congestive) heart failure: Secondary | ICD-10-CM | POA: Diagnosis present

## 2017-03-23 DIAGNOSIS — Z8249 Family history of ischemic heart disease and other diseases of the circulatory system: Secondary | ICD-10-CM

## 2017-03-23 DIAGNOSIS — Z7901 Long term (current) use of anticoagulants: Secondary | ICD-10-CM | POA: Diagnosis not present

## 2017-03-23 DIAGNOSIS — R748 Abnormal levels of other serum enzymes: Secondary | ICD-10-CM

## 2017-03-23 DIAGNOSIS — E861 Hypovolemia: Secondary | ICD-10-CM | POA: Diagnosis present

## 2017-03-23 DIAGNOSIS — I252 Old myocardial infarction: Secondary | ICD-10-CM | POA: Diagnosis not present

## 2017-03-23 DIAGNOSIS — R7989 Other specified abnormal findings of blood chemistry: Secondary | ICD-10-CM | POA: Diagnosis not present

## 2017-03-23 DIAGNOSIS — Z951 Presence of aortocoronary bypass graft: Secondary | ICD-10-CM

## 2017-03-23 DIAGNOSIS — Z86711 Personal history of pulmonary embolism: Secondary | ICD-10-CM

## 2017-03-23 DIAGNOSIS — N400 Enlarged prostate without lower urinary tract symptoms: Secondary | ICD-10-CM | POA: Diagnosis present

## 2017-03-23 DIAGNOSIS — K5732 Diverticulitis of large intestine without perforation or abscess without bleeding: Secondary | ICD-10-CM | POA: Diagnosis not present

## 2017-03-23 DIAGNOSIS — Z833 Family history of diabetes mellitus: Secondary | ICD-10-CM | POA: Diagnosis not present

## 2017-03-23 DIAGNOSIS — R079 Chest pain, unspecified: Secondary | ICD-10-CM | POA: Diagnosis present

## 2017-03-23 DIAGNOSIS — I5022 Chronic systolic (congestive) heart failure: Secondary | ICD-10-CM | POA: Diagnosis not present

## 2017-03-23 DIAGNOSIS — I739 Peripheral vascular disease, unspecified: Secondary | ICD-10-CM | POA: Diagnosis present

## 2017-03-23 DIAGNOSIS — K219 Gastro-esophageal reflux disease without esophagitis: Secondary | ICD-10-CM | POA: Diagnosis present

## 2017-03-23 DIAGNOSIS — I10 Essential (primary) hypertension: Secondary | ICD-10-CM | POA: Diagnosis present

## 2017-03-23 DIAGNOSIS — N136 Pyonephrosis: Secondary | ICD-10-CM | POA: Diagnosis not present

## 2017-03-23 DIAGNOSIS — N12 Tubulo-interstitial nephritis, not specified as acute or chronic: Secondary | ICD-10-CM | POA: Diagnosis not present

## 2017-03-23 DIAGNOSIS — R0602 Shortness of breath: Secondary | ICD-10-CM | POA: Diagnosis not present

## 2017-03-23 DIAGNOSIS — I5043 Acute on chronic combined systolic (congestive) and diastolic (congestive) heart failure: Secondary | ICD-10-CM | POA: Diagnosis present

## 2017-03-23 DIAGNOSIS — R310 Gross hematuria: Secondary | ICD-10-CM | POA: Diagnosis present

## 2017-03-23 DIAGNOSIS — E785 Hyperlipidemia, unspecified: Secondary | ICD-10-CM | POA: Diagnosis present

## 2017-03-23 DIAGNOSIS — N1330 Unspecified hydronephrosis: Secondary | ICD-10-CM | POA: Diagnosis not present

## 2017-03-23 DIAGNOSIS — A419 Sepsis, unspecified organism: Secondary | ICD-10-CM | POA: Diagnosis present

## 2017-03-23 DIAGNOSIS — N10 Acute tubulo-interstitial nephritis: Secondary | ICD-10-CM | POA: Diagnosis not present

## 2017-03-23 DIAGNOSIS — I248 Other forms of acute ischemic heart disease: Secondary | ICD-10-CM | POA: Diagnosis present

## 2017-03-23 DIAGNOSIS — I25110 Atherosclerotic heart disease of native coronary artery with unstable angina pectoris: Secondary | ICD-10-CM | POA: Diagnosis present

## 2017-03-23 DIAGNOSIS — Z87442 Personal history of urinary calculi: Secondary | ICD-10-CM | POA: Diagnosis not present

## 2017-03-23 DIAGNOSIS — E039 Hypothyroidism, unspecified: Secondary | ICD-10-CM | POA: Diagnosis present

## 2017-03-23 DIAGNOSIS — I110 Hypertensive heart disease with heart failure: Secondary | ICD-10-CM | POA: Diagnosis present

## 2017-03-23 DIAGNOSIS — I5032 Chronic diastolic (congestive) heart failure: Secondary | ICD-10-CM | POA: Diagnosis present

## 2017-03-23 DIAGNOSIS — N179 Acute kidney failure, unspecified: Secondary | ICD-10-CM | POA: Diagnosis present

## 2017-03-23 DIAGNOSIS — N189 Chronic kidney disease, unspecified: Secondary | ICD-10-CM | POA: Diagnosis present

## 2017-03-23 DIAGNOSIS — I2510 Atherosclerotic heart disease of native coronary artery without angina pectoris: Secondary | ICD-10-CM | POA: Diagnosis present

## 2017-03-23 DIAGNOSIS — E038 Other specified hypothyroidism: Secondary | ICD-10-CM | POA: Diagnosis present

## 2017-03-23 DIAGNOSIS — I480 Paroxysmal atrial fibrillation: Secondary | ICD-10-CM | POA: Diagnosis present

## 2017-03-23 DIAGNOSIS — R778 Other specified abnormalities of plasma proteins: Secondary | ICD-10-CM | POA: Diagnosis present

## 2017-03-23 DIAGNOSIS — Z87891 Personal history of nicotine dependence: Secondary | ICD-10-CM

## 2017-03-23 LAB — URINALYSIS, ROUTINE W REFLEX MICROSCOPIC
Bilirubin Urine: NEGATIVE
Glucose, UA: 50 mg/dL — AB
KETONES UR: NEGATIVE mg/dL
Nitrite: NEGATIVE
PROTEIN: 100 mg/dL — AB
Specific Gravity, Urine: 1.013 (ref 1.005–1.030)
Squamous Epithelial / LPF: NONE SEEN
pH: 5 (ref 5.0–8.0)

## 2017-03-23 LAB — BASIC METABOLIC PANEL
Anion gap: 17 — ABNORMAL HIGH (ref 5–15)
BUN: 24 mg/dL — AB (ref 6–20)
CO2: 19 mmol/L — ABNORMAL LOW (ref 22–32)
Calcium: 8.7 mg/dL — ABNORMAL LOW (ref 8.9–10.3)
Chloride: 98 mmol/L — ABNORMAL LOW (ref 101–111)
Creatinine, Ser: 2.31 mg/dL — ABNORMAL HIGH (ref 0.61–1.24)
GFR calc Af Amer: 30 mL/min — ABNORMAL LOW (ref >60)
GFR, EST NON AFRICAN AMERICAN: 26 mL/min — AB (ref >60)
GLUCOSE: 150 mg/dL — AB (ref 65–99)
POTASSIUM: 4 mmol/L (ref 3.5–5.1)
Sodium: 134 mmol/L — ABNORMAL LOW (ref 135–145)

## 2017-03-23 LAB — CBC
HCT: 40.9 % (ref 39.0–52.0)
Hemoglobin: 13.6 g/dL (ref 13.0–17.0)
MCH: 31.1 pg (ref 26.0–34.0)
MCHC: 33.3 g/dL (ref 30.0–36.0)
MCV: 93.6 fL (ref 78.0–100.0)
Platelets: 304 10*3/uL (ref 150–400)
RBC: 4.37 MIL/uL (ref 4.22–5.81)
RDW: 14 % (ref 11.5–15.5)
WBC: 16 10*3/uL — ABNORMAL HIGH (ref 4.0–10.5)

## 2017-03-23 LAB — I-STAT TROPONIN, ED: Troponin i, poc: 0.21 ng/mL (ref 0.00–0.08)

## 2017-03-23 LAB — I-STAT CG4 LACTIC ACID, ED: Lactic Acid, Venous: 1.5 mmol/L (ref 0.5–1.9)

## 2017-03-23 LAB — TROPONIN I: TROPONIN I: 0.22 ng/mL — AB (ref <0.03)

## 2017-03-23 MED ORDER — METRONIDAZOLE IN NACL 5-0.79 MG/ML-% IV SOLN
500.0000 mg | Freq: Three times a day (TID) | INTRAVENOUS | Status: DC
  Administered 2017-03-23 – 2017-03-25 (×5): 500 mg via INTRAVENOUS
  Filled 2017-03-23 (×5): qty 100

## 2017-03-23 MED ORDER — MORPHINE SULFATE (PF) 4 MG/ML IV SOLN: 2.0000 mg | INTRAVENOUS | Status: DC | PRN

## 2017-03-23 MED ORDER — SODIUM CHLORIDE 0.9 % IV BOLUS (SEPSIS)
1000.0000 mL | Freq: Once | INTRAVENOUS | Status: AC
  Administered 2017-03-23: 1000 mL via INTRAVENOUS

## 2017-03-23 MED ORDER — CEFTRIAXONE SODIUM 1 G IJ SOLR
1.0000 g | INTRAMUSCULAR | Status: DC
  Administered 2017-03-24 – 2017-03-25 (×2): 1 g via INTRAVENOUS
  Filled 2017-03-23 (×3): qty 10

## 2017-03-23 MED ORDER — HYDROCODONE-ACETAMINOPHEN 5-325 MG PO TABS
1.0000 | ORAL_TABLET | Freq: Four times a day (QID) | ORAL | Status: DC | PRN
Start: 2017-03-23 — End: 2017-03-26

## 2017-03-23 MED ORDER — DEXTROSE 5 % IV SOLN
1.0000 g | Freq: Once | INTRAVENOUS | Status: AC
  Administered 2017-03-23: 1 g via INTRAVENOUS
  Filled 2017-03-23: qty 10

## 2017-03-23 MED ORDER — TAMSULOSIN HCL 0.4 MG PO CAPS
0.4000 mg | ORAL_CAPSULE | Freq: Every day | ORAL | Status: DC
  Administered 2017-03-23 – 2017-03-25 (×3): 0.4 mg via ORAL
  Filled 2017-03-23 (×3): qty 1

## 2017-03-23 MED ORDER — NITROGLYCERIN 0.4 MG SL SUBL
0.4000 mg | SUBLINGUAL_TABLET | SUBLINGUAL | Status: AC | PRN
  Administered 2017-03-23 (×3): 0.4 mg via SUBLINGUAL
  Filled 2017-03-23: qty 1

## 2017-03-23 MED ORDER — ACETAMINOPHEN 325 MG PO TABS: 650.0000 mg | ORAL_TABLET | ORAL | Status: DC | PRN

## 2017-03-23 MED ORDER — CARVEDILOL 3.125 MG PO TABS
3.1250 mg | ORAL_TABLET | Freq: Every day | ORAL | Status: DC
  Administered 2017-03-23 – 2017-03-26 (×4): 3.125 mg via ORAL
  Filled 2017-03-23 (×4): qty 1

## 2017-03-23 MED ORDER — ONDANSETRON HCL 4 MG/2ML IJ SOLN
4.0000 mg | Freq: Four times a day (QID) | INTRAMUSCULAR | Status: DC | PRN
  Administered 2017-03-24: 4 mg via INTRAVENOUS
  Filled 2017-03-23: qty 2

## 2017-03-23 MED ORDER — APIXABAN 5 MG PO TABS
5.0000 mg | ORAL_TABLET | Freq: Two times a day (BID) | ORAL | Status: DC
  Administered 2017-03-23 – 2017-03-26 (×6): 5 mg via ORAL
  Filled 2017-03-23 (×6): qty 1

## 2017-03-23 MED ORDER — ONDANSETRON HCL 4 MG/2ML IJ SOLN: 4.0000 mg | Freq: Four times a day (QID) | INTRAMUSCULAR | Status: DC | PRN

## 2017-03-23 MED ORDER — NITROGLYCERIN 0.4 MG SL SUBL: 0.4000 mg | SUBLINGUAL_TABLET | SUBLINGUAL | Status: DC | PRN

## 2017-03-23 MED ORDER — LEVOTHYROXINE SODIUM 25 MCG PO TABS
25.0000 ug | ORAL_TABLET | Freq: Every day | ORAL | Status: DC
  Administered 2017-03-25 – 2017-03-26 (×2): 25 ug via ORAL
  Filled 2017-03-23 (×3): qty 1

## 2017-03-23 MED ORDER — AMIODARONE HCL 200 MG PO TABS
200.0000 mg | ORAL_TABLET | Freq: Every day | ORAL | Status: DC
  Filled 2017-03-23: qty 1

## 2017-03-23 MED ORDER — METHOCARBAMOL 500 MG PO TABS: 500.0000 mg | ORAL_TABLET | Freq: Three times a day (TID) | ORAL | Status: DC | PRN

## 2017-03-23 NOTE — ED Triage Notes (Signed)
Pt here for mid steranl CP and hematuria

## 2017-03-23 NOTE — H&P (Signed)
History and Physical    Ryan [name]:[id number] DOB: March 10, 1941 DOA: 03/23/2017  PCP: Marton Redwood, MD   Patient coming from: Home  Chief Complaint: Lethargy, right flank pain, nausea, gross hematuria  HPI: Ryan [name] is a 76 y.o. male with medical history significant for coronary artery disease status post CABG, chronic systolic CHF, history of DVT and PE, nephrolithiasis, and hypertension who presents to the emergency department for evaluation of lethargy, nausea, lower abdominal pain, right flank pain, and gross hematuria. Patient reports becoming increasingly fatigued over the past 3-4 days with subjective fevers and chills, nausea, vomiting, right flank pain, and gross hematuria. His pain is described as severe, radiating from the right flank to the right scapula, waxing and waning, with no alleviating or exacerbating factors identified, and associated with nausea and vomiting. Patient has not attempted any interventions prior to coming in.  ED Course: Upon arrival to the ED, patient is found to be afebrile, saturating adequately on room air, mildly tachypneic, and with vitals otherwise stable. EKG features a sinus tachycardia with rate 110 with inferior ST and T wave abnormalities. Chest x-ray features chronic bronchitic changes and accentuated interstitial markings without acute infiltrate. Chemistry panels notable for a BUN of 24 and creatinine of 2.31, up from 1.0 recently. Serum bicarbonate is 19 and anion gap 17. CBC features a leukocytosis to 16,000 and lactic acid is reassuring at 1.50. Urinalysis is consistent with infection and there is moderate hemoglobin on the dipstick. Troponin is elevated to a value of 0.21. CT abdomen renal stone study is notable for moderate right-sided hydronephrosis and mild to moderate right hydroureter with the 5 mm right kidney stone seen on the recent study no longer visualized there, but with a 5 mm stone now noted in the urinary bladder,  suggesting recent passage. Urine was sent for culture, 1 L of normal saline was given, and the patient was started on empiric Rocephin. Cardiology was consulted regarding the elevated troponin and advised a medical admission, suspecting that this represents a demand ischemia rather than ACS. Patient has remained stable in the emergency department and will be admitted to the telemetry unit for ongoing evaluation and management of urinary tract infection with acute kidney injury and elevated troponin in a patient with known CAD.  Review of Systems:  All other systems reviewed and apart from HPI, are negative.  Past Medical History:  Diagnosis Date  . Arthritis   . CAD (coronary artery disease)   . Carotid artery occlusion   . Cataract    Bil eyes/worse in left eye  . CHF (congestive heart failure) ([place])   . Chronic back pain   . DVT (deep venous thrombosis) ([place])   . Dysrhythmia   . Enlarged prostate    takes Rapaflo daily  . GERD (gastroesophageal reflux disease)    occasional  . History of colon polyps   . History of gout    has colchicine prn  . History of kidney stones   . Hyperlipidemia    takes Crestor daily  . Hypertension    takes Amlodipine daily  . Hypothyroidism   . Myocardial infarction   . Peripheral vascular disease ([place])   . Pulmonary emboli ([place]) 03/20/2015   elevated d-dimer, intermediate V/Q study, atypical chest pain and SOB. Start on Xarelto 20mg  BID for 3 month  . Renal insufficiency   . Shortness of breath dyspnea   . Urinary frequency   . Urinary urgency     Past Surgical  History:  Procedure Laterality Date  . APPENDECTOMY    . BACK SURGERY     5 times  . big toe surgery    . CARDIAC CATHETERIZATION     2010    dr Acie Fredrickson  . cataract surgery     left eye  . CHOLECYSTECTOMY N/A 07/27/2016   Procedure: LAPAROSCOPIC CHOLECYSTECTOMY;  Surgeon: Mickeal Skinner, MD;  Location: [place];  Service: General;  Laterality: N/A;  . COLONOSCOPY    . CORONARY  ARTERY BYPASS GRAFT N/A 04/05/2015   Procedure: CORONARY ARTERY BYPASS GRAFTING (CABG)X4 LIMA-LAD; SVG-DIAG1-DIAG2; SVG-PD;  Surgeon: [name], MD;  Location: [place];  Service: Open Heart Surgery;  Laterality: N/A;  . CYSTOSCOPY    . ENDARTERECTOMY Left 04/24/2016   Procedure: ENDARTERECTOMY LEFT CAROTID;  Surgeon: Mal Misty, MD;  Location: [place];  Service: Vascular;  Laterality: Left;  . EYE SURGERY    . FEMORAL ARTERY - POPLITEAL ARTERY BYPASS GRAFT    . JOINT REPLACEMENT     shoulder  . LEFT HEART CATHETERIZATION WITH CORONARY ANGIOGRAM N/A 04/03/2015   Procedure: LEFT HEART CATHETERIZATION WITH CORONARY ANGIOGRAM;  Surgeon: Troy Sine, MD;  Location: [facility] CATH LAB;  Service: Cardiovascular;  Laterality: N/A;  . LUMBAR LAMINECTOMY  01/06/2013   Procedure: MICRODISCECTOMY LUMBAR LAMINECTOMY;  Surgeon: Marybelle Killings, MD;  Location: [place];  Service: Orthopedics;  Laterality: N/A;  L3-4 decompression  . LUMBAR LAMINECTOMY/DECOMPRESSION MICRODISCECTOMY  02/12/2012   Procedure: LUMBAR LAMINECTOMY/DECOMPRESSION MICRODISCECTOMY;  Surgeon: Floyce Stakes, MD;  Location: [place] NEURO ORS;  Service: Neurosurgery;  Laterality: N/A;  Lumbar four-five laminectomy  . PATCH ANGIOPLASTY Left 04/24/2016   Procedure: LEFT CAROTID ARTERY PATCH ANGIOPLASTY;  Surgeon: Mal Misty, MD;  Location: [place];  Service: Vascular;  Laterality: Left;  . STERIOD INJECTION Right 01/09/2014   Procedure: STEROID INJECTION;  Surgeon: Mcarthur Rossetti, MD;  Location: [place];  Service: Orthopedics;  Laterality: Right;  . TEE WITHOUT CARDIOVERSION N/A 04/05/2015   Procedure: TRANSESOPHAGEAL ECHOCARDIOGRAM (TEE);  Surgeon: [name], MD;  Location: [place];  Service: Open Heart Surgery;  Laterality: N/A;  . TOTAL HIP ARTHROPLASTY Left 01/09/2014   DR Ninfa Linden  . TOTAL HIP ARTHROPLASTY Left 01/09/2014   Procedure: LEFT TOTAL HIP ARTHROPLASTY ANTERIOR APPROACH and Steroid Injection Right hip;  Surgeon:  Mcarthur Rossetti, MD;  Location: [place];  Service: Orthopedics;  Laterality: Left;     reports that he quit smoking about 30 years ago. His smoking use included Cigarettes. He quit smokeless tobacco use about 7 years ago. His smokeless tobacco use included Chew. He reports that he does not drink alcohol or use drugs.  Allergies  Allergen Reactions  . Codeine Nausea And Vomiting       . Zetia [Ezetimibe] Other (See Comments)    Myalgias     Family History  Problem Relation Age of Onset  . Heart disease Father   . Heart attack Father   . Heart disease Sister   . Hypertension Sister   . Heart attack Sister   . Hypertension Mother   . Diabetes Son      Prior to Admission medications   Medication Sig Start Date End Date Taking? Authorizing Provider  amiodarone (PACERONE) 200 MG tablet Take 1 tablet (200 mg total) by mouth daily. Take 1 tablet (200 mg) twice daily for 1 week then take 200 mg daily Patient taking differently: Take 200 mg by mouth daily.  03/10/17  Yes  Thayer Headings, MD  apixaban (ELIQUIS) 5 MG TABS tablet Take 1 tablet (5 mg total) by mouth 2 (two) times daily. 03/01/17  Yes Reyne Dumas, MD  carvedilol (COREG) 3.125 MG tablet Take 3.125 mg by mouth daily. 12/16/16  Yes Historical Provider, MD  colchicine 0.6 MG tablet Take 1 tablet (0.6 mg total) by mouth 2 (two) times daily. 03/01/17 03/23/17 Yes Reyne Dumas, MD  docusate sodium (COLACE) 100 MG capsule Take 100 mg by mouth daily.    Yes Historical Provider, MD  furosemide (LASIX) 40 MG tablet Take 1 tablet (40 mg total) by mouth daily. 03/01/17  Yes Reyne Dumas, MD  HYDROcodone-acetaminophen (NORCO/VICODIN) 5-325 MG tablet Take 1 tablet by mouth every 6 (six) hours as needed for moderate pain. 07/28/16  Yes Arta Bruce Kinsinger, MD  levothyroxine (SYNTHROID, LEVOTHROID) 25 MCG tablet Take 1 tablet (25 mcg total) by mouth daily before breakfast. 03/02/17  Yes Reyne Dumas, MD  methocarbamol (ROBAXIN) 500 MG tablet Take  1 tablet (500 mg total) by mouth every 8 (eight) hours as needed for muscle spasms. 03/01/17  Yes Reyne Dumas, MD  nitroGLYCERIN (NITROSTAT) 0.4 MG SL tablet Place 1 tablet (0.4 mg total) under the tongue every 5 (five) minutes as needed for chest pain. 05/27/16  Yes Thayer Headings, MD  tamsulosin (FLOMAX) 0.4 MG CAPS capsule Take 0.4 mg by mouth at bedtime. 03/01/17  Yes Historical Provider, MD    Physical Exam: Vitals:   03/23/17 1930 03/23/17 2000 03/23/17 2015 03/23/17 2045  BP: 125/81 137/76 (!) 104/92 128/74  Pulse: 92 87 87 87  Resp:  (!) 24 19 (!) 26  Temp:      TempSrc:      SpO2: 96% 97% 99% 92%      Constitutional: No acute respiratory distress. In apparent discomfort. No pallor or diaphoresis.  Eyes: PERTLA, lids and conjunctivae normal ENMT: Mucous membranes are moist. Posterior pharynx clear of any exudate or lesions.   Neck: normal, supple, no masses, no thyromegaly Respiratory: Diminished in the bases, no wheezing, no crackles. Normal respiratory effort.  Cardiovascular: Rate ~110 and regular. No extremity edema. No significant JVD. Abdomen: No distension, tender in LLQ and right flank. No rebound pain or guarding, no masses palpated. Bowel sounds appreciated.  Musculoskeletal: no clubbing / cyanosis. No joint deformity upper and lower extremities.   Skin: no significant rashes, lesions, ulcers. Warm, dry, well-perfused. Neurologic: CN 2-12 grossly intact. Sensation intact, DTR normal. Strength 5/5 in all 4 limbs.  Psychiatric: Alert and oriented x 3. Normal mood and affect.     Labs on Admission: I have personally reviewed following labs and imaging studies  CBC:  Recent Labs Lab 03/23/17 1542  WBC 16.0*  HGB 13.6  HCT 40.9  MCV 93.6  PLT 240   Basic Metabolic Panel:  Recent Labs Lab 03/23/17 1542  NA 134*  K 4.0  CL 98*  CO2 19*  GLUCOSE 150*  BUN 24*  CREATININE 2.31*  CALCIUM 8.7*   GFR: Estimated Creatinine Clearance: 27.2 mL/min (A) (by  C-G formula based on SCr of 2.31 mg/dL (H)). Liver Function Tests: No results for input(s): AST, ALT, ALKPHOS, BILITOT, PROT, ALBUMIN in the last 168 hours. No results for input(s): LIPASE, AMYLASE in the last 168 hours. No results for input(s): AMMONIA in the last 168 hours. Coagulation Profile: No results for input(s): INR, PROTIME in the last 168 hours. Cardiac Enzymes:  Recent Labs Lab 03/23/17 1728  TROPONINI 0.22*   BNP (last  3 results) No results for input(s): PROBNP in the last 8760 hours. HbA1C: No results for input(s): HGBA1C in the last 72 hours. CBG: No results for input(s): GLUCAP in the last 168 hours. Lipid Profile: No results for input(s): CHOL, HDL, LDLCALC, TRIG, CHOLHDL, LDLDIRECT in the last 72 hours. Thyroid Function Tests: No results for input(s): TSH, T4TOTAL, FREET4, T3FREE, THYROIDAB in the last 72 hours. Anemia Panel: No results for input(s): VITAMINB12, FOLATE, FERRITIN, TIBC, IRON, RETICCTPCT in the last 72 hours. Urine analysis:    Component Value Date/Time   COLORURINE RED (A) 03/23/2017 1619   APPEARANCEUR TURBID (A) 03/23/2017 1619   LABSPEC 1.013 03/23/2017 1619   PHURINE 5.0 03/23/2017 1619   GLUCOSEU 50 (A) 03/23/2017 1619   HGBUR MODERATE (A) 03/23/2017 1619   BILIRUBINUR NEGATIVE 03/23/2017 1619   KETONESUR NEGATIVE 03/23/2017 1619   PROTEINUR 100 (A) 03/23/2017 1619   UROBILINOGEN 1.0 04/04/2015 2320   NITRITE NEGATIVE 03/23/2017 1619   LEUKOCYTESUR MODERATE (A) 03/23/2017 1619   Sepsis Labs: @LABRCNTIP (procalcitonin:4,lacticidven:4) )No results found for this or any previous visit (from the past 240 hour(s)).   Radiological Exams on Admission: Dg Chest 2 View  Result Date: 03/23/2017 CLINICAL DATA:  , shortness of breath and weakness for 2 weeks, had fluid drawn from his lungs 3 weeks ago, history coronary artery disease post MI and bypass surgery, former smoker, CHF, hypertension EXAM: CHEST  2 VIEW COMPARISON:  02/26/2017  FINDINGS: Enlargement of cardiac silhouette post CABG. Atherosclerotic calcification aorta. Mediastinal contours and pulmonary vascularity normal. Bronchitic changes with chronic interstitial prominence, stable. No definite acute infiltrate, pleural effusion or pneumothorax. Bones demineralized with mild degenerative changes of the thoracic spine. Prior LEFT shoulder replacement with an again identified fractured screw at the inferior LEFT glenoid. IMPRESSION: Enlargement of cardiac silhouette post CABG. Aortic atherosclerosis. Chronic bronchitic changes and accentuation of interstitial markings without acute infiltrate. Electronically Signed   By: Lavonia Dana M.D.   On: 03/23/2017 16:22   Ct Renal Stone Study  Result Date: 03/23/2017 CLINICAL DATA:  Heart and sub scapular pain for 2 weeks history of kidney stones EXAM: CT ABDOMEN AND PELVIS WITHOUT CONTRAST TECHNIQUE: Multidetector CT imaging of the abdomen and pelvis was performed following the standard protocol without IV contrast. COMPARISON:  10/19/2016, 02/26/2017 FINDINGS: Lower chest: Small bilateral pleural effusions. Calcified granuloma at the right middle lobe. Dependent atelectasis in the posterior lower lobes. The heart appears slightly enlarged. Coronary artery calcifications. Hepatobiliary: Stable hepatic cyst. Gallbladder surgically absent. No biliary dilatation. Pancreas: Unremarkable. No pancreatic ductal dilatation or surrounding inflammatory changes. Spleen: Normal in size without focal abnormality. Adrenals/Urinary Tract: Adrenal glands are within normal limits. Small left intrarenal calculus versus vascular calcification. Cyst lower pole left kidney. Previously noted stone in the upper pole of the right kidney is not identified. There is moderate hydronephrosis of right extrarenal pelvis. There is right hydroureter. There is a 4-5 mm stone visualized within the posterior aspect of the bladder. The bladder is otherwise normal. Stomach/Bowel:  Stomach within normal. No dilated small bowel. No colon wall thickening. Sigmoid colon diverticula. Possible minimal edema/stranding within the central pelvis. Vascular/Lymphatic: Aortic atherosclerosis. No enlarged abdominal or pelvic lymph nodes. Reproductive: Enlarged prostate gland. Other: No free air or free fluid. Musculoskeletal: Multilevel degenerative changes of the spine. No acute or suspicious bone lesion. Left hip replacement with associated artifact IMPRESSION: 1. Moderate hydronephrosis of right extrarenal pelvis with mild to moderate right hydroureter. The previously noted stone in the upper pole of the right  kidney is not visualized in the kidney; there is however a 5 mm stone present in the posterior aspect of the bladder, the constellation of findings would be consistent with recently passed kidney stone. 2. Sigmoid colon diverticular disease. Subtle inflammatory changes present within the central pelvic near the distal sigmoid colon suggesting mild diverticulitis. No gross perforation. 3. Small bilateral pleural effusions Electronically Signed   By: Donavan Foil M.D.   On: 03/23/2017 19:19    EKG: Independently reviewed. Sinus tachycardia (rate 110), inferior ST and T-wave abnormalities  Assessment/Plan  1. UTI  - Presents with leukocytosis, right flank pain, and tachycardia; found to have UTI  - No prior positive cultures in EMR  - Urine sent for culture and empiric Rocephin started in ED  - Continue Rocephin while following cultures and clinical response to therapy   2. Acute kidney injury  - SCr is 2.31 on admission, up from 1.0 last month  - CT abd findings suggest recent passage of stone from the right kidney with some residual hydroureteronephrosis; pt also appears dry on admission   - 1 liter NS given in ED and gentle NS infusion continued  - Check urine studies, hold Lasix, follow daily wt and I/O's, repeat chem panel in am    3. CAD, elevated troponin  - Pt with hx of  CAD s/p CABG followed by cardiology  - Troponin is elevated to 0.21 on admission without anginal complaint  - ED physician discussed with cardiology; felt to represent a demand ischemia  - Plan to monitor on telemetry, trend troponin, repeat EKG, continue to treat underlying illness    4. Sigmoid diverticulitis   - Pt reports crampy lower abd pain that he had been attributing to possible constipation  - There is a leukocytosis noted (also has UTI) and CT-findings suggest sigmoid diverticulitis without perforation  - He is on Rocephin for UTI as above and Flagyl is added 500 mg q8h  - Supportive care with gentle IVF and analgesia   5. Chronic systolic CHF  - Pt appears dry on admission  - TTE (02/27/17) with EF 45%, mild LVH, focal areas of AK and HK, mild-mod AS, mod-sev LAE, severely decreased RV function  - Managed at home with Lasix 40 mg qD and Coreg  - Lasix is held given hypovolemia and AKI; Coreg will be continued as tolerated  - Follow daily wts and strict I/O's    6. Hypertension - BP is at goal  - Managed at home with Coreg, will continue    7. Hypothyroidism  - Appears to be stable  - Continue Synthroid    DVT prophylaxis: Eliquis  Code Status: Full  Family Communication: Wife updated at bedside  Disposition Plan: Observe on telemetry Consults called: ED physician discussed case with cardiology  Admission status: Inpatient    Vianne Bulls, MD [facility] Pager [phone]  If 7PM-7AM, please contact night-coverage [email or web address] Password [facility]  03/23/2017, 9:32 PM

## 2017-03-23 NOTE — ED Notes (Signed)
Attempted report x1. 

## 2017-03-23 NOTE — ED Notes (Signed)
Patient transported to CT via stretcher.

## 2017-03-23 NOTE — Telephone Encounter (Signed)
New message   Pt wife is calling about pt. She said he has been sleeping all the time and he won't eat. She said when he does eat he throws it back up. She isn't sure if it is medication that was prescribed to him.

## 2017-03-23 NOTE — Telephone Encounter (Signed)
I spoke with pt's wife who reports all pt wants to do is sleep. Has no energy. He tried holding medications as directed by Dr. Acie Fredrickson at last office visit and this has not helped his nausea. Has not taken any medications since yesterday.  Poor appetite yesterday. Wife gave him soup this AM but pt vomited after taking. Also has been very constipated.  I told pt's wife medications could cause nausea/vomiting but pt may have something else going on as well.  I recommended he see primary care.  Pt's wife is agreeable with this plan.

## 2017-03-23 NOTE — ED Provider Notes (Signed)
[facility] DEPT Provider Note   CSN: [id number] Arrival date & time: 03/23/17  1533     History   Chief Complaint Chief Complaint  Patient presents with  . Chest Pain    HPI Ryan [name] is a 76 y.o. male.  76 yo M with a chief complaint of feeling weak nausea vomiting diaphoresis and right-sided chest pain. This been going on for the past 3 or 4 days. Some subjective fevers and chills. Very mild cough. Nonexertional. Patient has had symptoms like this previously and thinks it's when he and had a heart attack. He also had symptoms like this just about a month ago and was admitted to the hospital. At that point he was found to have new atrial fibrillation with RVR and CHF. Patient states that every time he tries to eat or drink something he has sudden severe right-sided chest pain that radiates to his right shoulder blade that makes him vomit and afterwards he shakes.   The history is provided by the patient.  Chest Pain   This is a new problem. The current episode started 2 days ago. The problem occurs constantly. The problem has been gradually worsening. The pain is associated with movement and eating. The pain is present in the substernal region. The pain is at a severity of 8/10. The pain does not radiate. Associated symptoms include abdominal pain, shortness of breath, vomiting and weakness. Pertinent negatives include no fever, no headaches and no palpitations. He has tried nothing for the symptoms. The treatment provided no relief. There are no known risk factors.  His past medical history is significant for CHF and MI.    Past Medical History:  Diagnosis Date  . Arthritis   . CAD (coronary artery disease)   . Carotid artery occlusion   . Cataract    Bil eyes/worse in left eye  . CHF (congestive heart failure) ([place])   . Chronic back pain   . DVT (deep venous thrombosis) ([place])   . Dysrhythmia   . Enlarged prostate    takes Rapaflo daily  . GERD (gastroesophageal  reflux disease)    occasional  . History of colon polyps   . History of gout    has colchicine prn  . History of kidney stones   . Hyperlipidemia    takes Crestor daily  . Hypertension    takes Amlodipine daily  . Hypothyroidism   . Myocardial infarction   . Peripheral vascular disease ([place])   . Pulmonary emboli ([place]) 03/20/2015   elevated d-dimer, intermediate V/Q study, atypical chest pain and SOB. Start on Xarelto 20mg  BID for 3 month  . Renal insufficiency   . Shortness of breath dyspnea   . Urinary frequency   . Urinary urgency     Patient Active Problem List   Diagnosis Date Noted  . Elevated troponin 03/23/2017  . Sigmoid diverticulitis 03/23/2017  . Acute pyelonephritis 03/23/2017  . AKI (acute kidney injury) ([place]) 03/23/2017  . Wrist pain, acute, right   . Acute systolic heart failure ([place])   . Hypothyroidism   . Rapid atrial fibrillation ([employer])   . Dyspnea 02/26/2017  . Shoulder blade pain 02/26/2017  . SOB (shortness of breath) 02/26/2017  . Chronic right shoulder pain   . Chronic systolic CHF (congestive heart failure) ([place])   . HLD (hyperlipidemia) 01/20/2017  . Chronic cholecystitis 07/27/2016  . PAD (peripheral artery disease) ([place]) 07/09/2015  . S/P CABG x 4 04/05/2015  . Coronary artery disease involving native  coronary artery of native heart without angina pectoris   . Atypical chest pain 03/20/2015  . Carotid artery disease ([place]) 10/09/2014  . PVD (peripheral vascular disease) ([place]) 10/09/2014  . HTN (hypertension) 05/30/2014  . Arthritis of left hip 01/09/2014  . Status post THR (total hip replacement) 01/09/2014  . Spinal stenosis, lumbar 01/06/2013    Class: Diagnosis of  . Atherosclerosis of native artery of extremity with intermittent claudication ([place]) 10/18/2012    Past Surgical History:  Procedure Laterality Date  . APPENDECTOMY    . BACK SURGERY     5 times  . big toe surgery    . CARDIAC CATHETERIZATION     2010    dr Acie Fredrickson  .  cataract surgery     left eye  . CHOLECYSTECTOMY N/A 07/27/2016   Procedure: LAPAROSCOPIC CHOLECYSTECTOMY;  Surgeon: Mickeal Skinner, MD;  Location: [facility];  Service: General;  Laterality: N/A;  . COLONOSCOPY    . CORONARY ARTERY BYPASS GRAFT N/A 04/05/2015   Procedure: CORONARY ARTERY BYPASS GRAFTING (CABG)X4 LIMA-LAD; SVG-DIAG1-DIAG2; SVG-PD;  Surgeon: [name], MD;  Location: [facility];  Service: Open Heart Surgery;  Laterality: N/A;  . CYSTOSCOPY    . ENDARTERECTOMY Left 04/24/2016   Procedure: ENDARTERECTOMY LEFT CAROTID;  Surgeon: Mal Misty, MD;  Location: [facility];  Service: Vascular;  Laterality: Left;  . EYE SURGERY    . FEMORAL ARTERY - POPLITEAL ARTERY BYPASS GRAFT    . JOINT REPLACEMENT     shoulder  . LEFT HEART CATHETERIZATION WITH CORONARY ANGIOGRAM N/A 04/03/2015   Procedure: LEFT HEART CATHETERIZATION WITH CORONARY ANGIOGRAM;  Surgeon: Troy Sine, MD;  Location: [facility] CATH LAB;  Service: Cardiovascular;  Laterality: N/A;  . LUMBAR LAMINECTOMY  01/06/2013   Procedure: MICRODISCECTOMY LUMBAR LAMINECTOMY;  Surgeon: Marybelle Killings, MD;  Location: [facility];  Service: Orthopedics;  Laterality: N/A;  L3-4 decompression  . LUMBAR LAMINECTOMY/DECOMPRESSION MICRODISCECTOMY  02/12/2012   Procedure: LUMBAR LAMINECTOMY/DECOMPRESSION MICRODISCECTOMY;  Surgeon: Floyce Stakes, MD;  Location: [facility] NEURO [facility]S;  Service: Neurosurgery;  Laterality: N/A;  Lumbar four-five laminectomy  . PATCH ANGIOPLASTY Left 04/24/2016   Procedure: LEFT CAROTID ARTERY PATCH ANGIOPLASTY;  Surgeon: Mal Misty, MD;  Location: [facility];  Service: Vascular;  Laterality: Left;  . STERIOD INJECTION Right 01/09/2014   Procedure: STEROID INJECTION;  Surgeon: Mcarthur Rossetti, MD;  Location: [facility];  Service: Orthopedics;  Laterality: Right;  . TEE WITHOUT CARDIOVERSION N/A 04/05/2015   Procedure: TRANSESOPHAGEAL ECHOCARDIOGRAM (TEE);  Surgeon: [name], MD;  Location: [facility];  Service: Open Heart Surgery;   Laterality: N/A;  . TOTAL HIP ARTHROPLASTY Left 01/09/2014   DR Ninfa Linden  . TOTAL HIP ARTHROPLASTY Left 01/09/2014   Procedure: LEFT TOTAL HIP ARTHROPLASTY ANTERIOR APPROACH and Steroid Injection Right hip;  Surgeon: Mcarthur Rossetti, MD;  Location: [facility];  Service: Orthopedics;  Laterality: Left;       Home Medications    Prior to Admission medications   Medication Sig Start Date End Date Taking? Authorizing Provider  amiodarone (PACERONE) 200 MG tablet Take 1 tablet (200 mg total) by mouth daily. Take 1 tablet (200 mg) twice daily for 1 week then take 200 mg daily Patient taking differently: Take 200 mg by mouth daily.  03/10/17  Yes Thayer Headings, MD  apixaban (ELIQUIS) 5 MG TABS tablet Take 1 tablet (5 mg total) by mouth 2 (two) times daily. 03/01/17  Yes Reyne Dumas, MD  carvedilol (COREG) 3.125 MG  tablet Take 3.125 mg by mouth daily. 12/16/16  Yes Historical Provider, MD  colchicine 0.6 MG tablet Take 1 tablet (0.6 mg total) by mouth 2 (two) times daily. 03/01/17 03/23/17 Yes Reyne Dumas, MD  docusate sodium (COLACE) 100 MG capsule Take 100 mg by mouth daily.    Yes Historical Provider, MD  furosemide (LASIX) 40 MG tablet Take 1 tablet (40 mg total) by mouth daily. 03/01/17  Yes Reyne Dumas, MD  HYDROcodone-acetaminophen (NORCO/VICODIN) 5-325 MG tablet Take 1 tablet by mouth every 6 (six) hours as needed for moderate pain. 07/28/16  Yes Arta Bruce Kinsinger, MD  levothyroxine (SYNTHROID, LEVOTHROID) 25 MCG tablet Take 1 tablet (25 mcg total) by mouth daily before breakfast. 03/02/17  Yes Reyne Dumas, MD  methocarbamol (ROBAXIN) 500 MG tablet Take 1 tablet (500 mg total) by mouth every 8 (eight) hours as needed for muscle spasms. 03/01/17  Yes Reyne Dumas, MD  nitroGLYCERIN (NITROSTAT) 0.4 MG SL tablet Place 1 tablet (0.4 mg total) under the tongue every 5 (five) minutes as needed for chest pain. 05/27/16  Yes Thayer Headings, MD  tamsulosin (FLOMAX) 0.4 MG CAPS capsule Take 0.4 mg by  mouth at bedtime. 03/01/17  Yes Historical Provider, MD    Family History Family History  Problem Relation Age of Onset  . Heart disease Father   . Heart attack Father   . Heart disease Sister   . Hypertension Sister   . Heart attack Sister   . Hypertension Mother   . Diabetes Son     Social History Social History  Substance Use Topics  . Smoking status: Former Smoker    Types: Cigarettes    Quit date: 02/04/1987  . Smokeless tobacco: Former Systems developer    Types: [place] date: 07/20/2009     Comment: quit 35+yrs ago  . Alcohol use No     Allergies   Codeine and Zetia [ezetimibe]   Review of Systems Review of Systems  Constitutional: Negative for chills and fever.  HENT: Negative for congestion and facial swelling.   Eyes: Negative for discharge and visual disturbance.  Respiratory: Positive for shortness of breath.   Cardiovascular: Negative for chest pain and palpitations.  Gastrointestinal: Positive for abdominal pain, diarrhea and vomiting.  Musculoskeletal: Negative for arthralgias and myalgias.  Skin: Negative for color change and rash.  Neurological: Positive for weakness. Negative for tremors, syncope and headaches.  Psychiatric/Behavioral: Negative for confusion and dysphoric mood.     Physical Exam Updated Vital Signs BP 133/64   Pulse 81   Temp 99.2 F (37.3 C) (Oral)   Resp (!) 24   SpO2 91%   Physical Exam  Constitutional: He is oriented to person, place, and time. He appears well-developed and well-nourished.  HENT:  Head: Normocephalic and atraumatic.  Eyes: EOM are normal. Pupils are equal, round, and reactive to light.  Neck: Normal range of motion. Neck supple. No JVD present.  Cardiovascular: Normal rate and regular rhythm.  Exam reveals no gallop and no friction rub.   No murmur heard. Pulmonary/Chest: No respiratory distress. He has no wheezes. He has no rales. He exhibits no tenderness.  Abdominal: He exhibits no distension and no  mass. There is no tenderness. There is no rebound and no guarding.  Musculoskeletal: Normal range of motion.  Neurological: He is alert and oriented to person, place, and time.  Skin: No rash noted. No pallor.  Psychiatric: He has a normal mood and affect. His behavior is normal.  Nursing note and vitals reviewed.    ED Treatments / Results  Labs (all labs ordered are listed, but only abnormal results are displayed) Labs Reviewed  BASIC METABOLIC PANEL - Abnormal; Notable for the following:       Result Value   Sodium 134 (*)    Chloride 98 (*)    CO2 19 (*)    Glucose, Bld 150 (*)    BUN 24 (*)    Creatinine, Ser 2.31 (*)    Calcium 8.7 (*)    GFR calc non Af Amer 26 (*)    GFR calc Af Amer 30 (*)    Anion gap 17 (*)    All other components within normal limits  CBC - Abnormal; Notable for the following:    WBC 16.0 (*)    All other components within normal limits  URINALYSIS, ROUTINE W REFLEX MICROSCOPIC - Abnormal; Notable for the following:    Color, Urine RED (*)    APPearance TURBID (*)    Glucose, UA 50 (*)    Hgb urine dipstick MODERATE (*)    Protein, ur 100 (*)    Leukocytes, UA MODERATE (*)    Bacteria, UA MANY (*)    Non Squamous Epithelial 0-5 (*)    All other components within normal limits  TROPONIN I - Abnormal; Notable for the following:    Troponin I 0.22 (*)    All other components within normal limits  I-STAT TROPOININ, ED - Abnormal; Notable for the following:    Troponin i, poc 0.21 (*)    All other components within normal limits  URINE CULTURE  TROPONIN I  TROPONIN I  TROPONIN I  BASIC METABOLIC PANEL  CBC  I-STAT CG4 LACTIC ACID, ED    EKG  EKG Interpretation  Date/Time:  Tuesday March 23 2017 15:39:15 EDT Ventricular Rate:  110 PR Interval:  166 QRS Duration: 76 QT Interval:  320 QTC Calculation: 433 R Axis:   51 Text Interpretation:  Sinus tachycardia ST & T wave abnormality, consider inferior ischemia Abnormal ECG new flipped  t waves in lateral leads Confirmed by Gerry Heaphy MD, DANIEL [id number]) on 03/23/2017 4:41:51 PM       Radiology Dg Chest 2 View  Result Date: 03/23/2017 CLINICAL DATA:  , shortness of breath and weakness for 2 weeks, had fluid drawn from his lungs 3 weeks ago, history coronary artery disease post MI and bypass surgery, former smoker, CHF, hypertension EXAM: CHEST  2 VIEW COMPARISON:  02/26/2017 FINDINGS: Enlargement of cardiac silhouette post CABG. Atherosclerotic calcification aorta. Mediastinal contours and pulmonary vascularity normal. Bronchitic changes with chronic interstitial prominence, stable. No definite acute infiltrate, pleural effusion or pneumothorax. Bones demineralized with mild degenerative changes of the thoracic spine. Prior LEFT shoulder replacement with an again identified fractured screw at the inferior LEFT glenoid. IMPRESSION: Enlargement of cardiac silhouette post CABG. Aortic atherosclerosis. Chronic bronchitic changes and accentuation of interstitial markings without acute infiltrate. Electronically Signed   By: Lavonia Dana M.D.   On: 03/23/2017 16:22   Ct Renal Stone Study  Result Date: 03/23/2017 CLINICAL DATA:  Heart and sub scapular pain for 2 weeks history of kidney stones EXAM: CT ABDOMEN AND PELVIS WITHOUT CONTRAST TECHNIQUE: Multidetector CT imaging of the abdomen and pelvis was performed following the standard protocol without IV contrast. COMPARISON:  10/19/2016, 02/26/2017 FINDINGS: Lower chest: Small bilateral pleural effusions. Calcified granuloma at the right middle lobe. Dependent atelectasis in the posterior lower lobes. The heart appears slightly enlarged. Coronary  artery calcifications. Hepatobiliary: Stable hepatic cyst. Gallbladder surgically absent. No biliary dilatation. Pancreas: Unremarkable. No pancreatic ductal dilatation or surrounding inflammatory changes. Spleen: Normal in size without focal abnormality. Adrenals/Urinary Tract: Adrenal glands are within normal  limits. Small left intrarenal calculus versus vascular calcification. Cyst lower pole left kidney. Previously noted stone in the upper pole of the right kidney is not identified. There is moderate hydronephrosis of right extrarenal pelvis. There is right hydroureter. There is a 4-5 mm stone visualized within the posterior aspect of the bladder. The bladder is otherwise normal. Stomach/Bowel: Stomach within normal. No dilated small bowel. No colon wall thickening. Sigmoid colon diverticula. Possible minimal edema/stranding within the central pelvis. Vascular/Lymphatic: Aortic atherosclerosis. No enlarged abdominal or pelvic lymph nodes. Reproductive: Enlarged prostate gland. Other: No free air or free fluid. Musculoskeletal: Multilevel degenerative changes of the spine. No acute or suspicious bone lesion. Left hip replacement with associated artifact IMPRESSION: 1. Moderate hydronephrosis of right extrarenal pelvis with mild to moderate right hydroureter. The previously noted stone in the upper pole of the right kidney is not visualized in the kidney; there is however a 5 mm stone present in the posterior aspect of the bladder, the constellation of findings would be consistent with recently passed kidney stone. 2. Sigmoid colon diverticular disease. Subtle inflammatory changes present within the central pelvic near the distal sigmoid colon suggesting mild diverticulitis. No gross perforation. 3. Small bilateral pleural effusions Electronically Signed   By: Donavan Foil M.D.   On: 03/23/2017 19:19    Procedures Procedures (including critical care time)  Medications Ordered in ED Medications  tamsulosin (FLOMAX) capsule 0.4 mg (not administered)  amiodarone (PACERONE) tablet 200 mg (not administered)  apixaban (ELIQUIS) tablet 5 mg (not administered)  levothyroxine (SYNTHROID, LEVOTHROID) tablet 25 mcg (not administered)  methocarbamol (ROBAXIN) tablet 500 mg (not administered)  carvedilol (COREG) tablet  3.125 mg (not administered)  HYDROcodone-acetaminophen (NORCO/VICODIN) 5-325 MG per tablet 1 tablet (not administered)  nitroGLYCERIN (NITROSTAT) SL tablet 0.4 mg (not administered)  acetaminophen (TYLENOL) tablet 650 mg (not administered)  ondansetron (ZOFRAN) injection 4 mg (not administered)  metroNIDAZOLE (FLAGYL) IVPB 500 mg (not administered)  morphine 4 MG/ML injection 2 mg (not administered)  cefTRIAXone (ROCEPHIN) 1 g in dextrose 5 % 50 mL IVPB (not administered)  sodium chloride 0.9 % bolus 1,000 mL (0 mLs Intravenous Stopped 03/23/17 1920)  nitroGLYCERIN (NITROSTAT) SL tablet 0.4 mg (0.4 mg Sublingual Given 03/23/17 1808)  cefTRIAXone (ROCEPHIN) 1 g in dextrose 5 % 50 mL IVPB (0 g Intravenous Stopped 03/23/17 2036)     Initial Impression / Assessment and Plan / ED Course  I have reviewed the triage vital signs and the nursing notes.  Pertinent labs & imaging results that were available during my care of the patient were reviewed by me and considered in my medical decision making (see chart for details).     76 yo M with cc of right sided sharp chest pain. Elevated trop. I discussed the case with Dr. Stanford Breed, cardiology. Due to the patient's severe atypical presentation does not feel this is likely to be an acute cardiac event. He feels it's more likely to be related to his acute GI illness and lack of oral intake. Will obtain a CT stone study as the patient is also reporting hematuria. Give fluids. Reassess.  Patient with Likely what was an infected stone however he has passed this on CT. Patient is feeling quite a bit better but with his acute kidney injury and positive  troponin level placed in the hospital.  CRITICAL CARE Performed by: Cecilio Asper   Total critical care time: 35 minutes  Critical care time was exclusive of separately billable procedures and treating other patients.  Critical care was necessary to treat or prevent imminent or life-threatening  deterioration.  Critical care was time spent personally by me on the following activities: development of treatment plan with patient and/or surrogate as well as nursing, discussions with consultants, evaluation of patient's response to treatment, examination of patient, obtaining history from patient or surrogate, ordering and performing treatments and interventions, ordering and review of laboratory studies, ordering and review of radiographic studies, pulse oximetry and re-evaluation of patient's condition.  The patients results and plan were reviewed and discussed.   Any x-rays performed were independently reviewed by myself.   Differential diagnosis were considered with the presenting HPI.  Medications  tamsulosin (FLOMAX) capsule 0.4 mg (not administered)  amiodarone (PACERONE) tablet 200 mg (not administered)  apixaban (ELIQUIS) tablet 5 mg (not administered)  levothyroxine (SYNTHROID, LEVOTHROID) tablet 25 mcg (not administered)  methocarbamol (ROBAXIN) tablet 500 mg (not administered)  carvedilol (COREG) tablet 3.125 mg (not administered)  HYDROcodone-acetaminophen (NORCO/VICODIN) 5-325 MG per tablet 1 tablet (not administered)  nitroGLYCERIN (NITROSTAT) SL tablet 0.4 mg (not administered)  acetaminophen (TYLENOL) tablet 650 mg (not administered)  ondansetron (ZOFRAN) injection 4 mg (not administered)  metroNIDAZOLE (FLAGYL) IVPB 500 mg (not administered)  morphine 4 MG/ML injection 2 mg (not administered)  cefTRIAXone (ROCEPHIN) 1 g in dextrose 5 % 50 mL IVPB (not administered)  sodium chloride 0.9 % bolus 1,000 mL (0 mLs Intravenous Stopped 03/23/17 1920)  nitroGLYCERIN (NITROSTAT) SL tablet 0.4 mg (0.4 mg Sublingual Given 03/23/17 1808)  cefTRIAXone (ROCEPHIN) 1 g in dextrose 5 % 50 mL IVPB (0 g Intravenous Stopped 03/23/17 2036)    Vitals:   03/23/17 2015 03/23/17 2045 03/23/17 2130 03/23/17 2145  BP: (!) 104/92 128/74 138/69 133/64  Pulse: 87 87 86 81  Resp: 19 (!) 26 (!) 24  (!) 24  Temp:      TempSrc:      SpO2: 99% 92% 92% 91%    Final diagnoses:  Elevated troponin  Pyelonephritis  AKI (acute kidney injury) ([place])    Admission/ observation were discussed with the admitting physician, patient and/or family and they are comfortable with the plan.   Final Clinical Impressions(s) / ED Diagnoses   Final diagnoses:  Elevated troponin  Pyelonephritis  AKI (acute kidney injury) [facility])    New Prescriptions Current Discharge Medication List       Deno Etienne, DO 03/23/17 2236

## 2017-03-24 LAB — SODIUM, URINE, RANDOM: Sodium, Ur: 73 mmol/L

## 2017-03-24 LAB — CBC
HCT: 32.8 % — ABNORMAL LOW (ref 39.0–52.0)
Hemoglobin: 10.9 g/dL — ABNORMAL LOW (ref 13.0–17.0)
MCH: 30.3 pg (ref 26.0–34.0)
MCHC: 32.9 g/dL (ref 30.0–36.0)
MCV: 92.1 fL (ref 78.0–100.0)
Platelets: 229 10*3/uL (ref 150–400)
RBC: 3.56 MIL/uL — ABNORMAL LOW (ref 4.22–5.81)
RDW: 13.9 % (ref 11.5–15.5)
WBC: 8.4 10*3/uL (ref 4.0–10.5)

## 2017-03-24 LAB — BASIC METABOLIC PANEL
Anion gap: 9 (ref 5–15)
BUN: 21 mg/dL — AB (ref 6–20)
CALCIUM: 8 mg/dL — AB (ref 8.9–10.3)
CO2: 23 mmol/L (ref 22–32)
Chloride: 104 mmol/L (ref 101–111)
Creatinine, Ser: 1.62 mg/dL — ABNORMAL HIGH (ref 0.61–1.24)
GFR, EST AFRICAN AMERICAN: 46 mL/min — AB (ref >60)
GFR, EST NON AFRICAN AMERICAN: 40 mL/min — AB (ref >60)
GLUCOSE: 103 mg/dL — AB (ref 65–99)
Potassium: 3.9 mmol/L (ref 3.5–5.1)
SODIUM: 136 mmol/L (ref 135–145)

## 2017-03-24 LAB — TROPONIN I
TROPONIN I: 0.44 ng/mL — AB (ref <0.03)
Troponin I: 0.37 ng/mL (ref <0.03)
Troponin I: 0.28 ng/mL (ref <0.03)

## 2017-03-24 LAB — CREATININE, URINE, RANDOM: CREATININE, URINE: 133.87 mg/dL

## 2017-03-24 MED ORDER — AMIODARONE HCL 200 MG PO TABS
200.0000 mg | ORAL_TABLET | Freq: Every evening | ORAL | Status: DC
  Administered 2017-03-24 – 2017-03-25 (×2): 200 mg via ORAL
  Filled 2017-03-24 (×2): qty 1

## 2017-03-24 MED ORDER — SODIUM CHLORIDE 0.9 % IV SOLN
INTRAVENOUS | Status: AC
  Administered 2017-03-24 (×2): via INTRAVENOUS

## 2017-03-24 MED ORDER — SODIUM CHLORIDE 0.9 % IV SOLN
INTRAVENOUS | Status: AC
  Administered 2017-03-24: 01:00:00 via INTRAVENOUS

## 2017-03-24 NOTE — Progress Notes (Signed)
Patient with no complaints or concerns during 7pm - 7am shift. Slept during the night.   Nannie Starzyk, RN 

## 2017-03-24 NOTE — Progress Notes (Signed)
RN paged increased troponin level, from .21 to .44. Pt has no active CP. NP reviewed H&P noting that cardiology had already been called when first troponin resulted, attributing elevation to demand. Continue to cycle troponins. Recheck EKG this am.  Patrica Duel, NP [facility]

## 2017-03-24 NOTE — Telephone Encounter (Signed)
Agree that this is likely a medical issue and not a cardiology issue

## 2017-03-24 NOTE — Discharge Instructions (Signed)

## 2017-03-24 NOTE — Telephone Encounter (Signed)
Patient is currently at  [facility] for treatment of renal issues

## 2017-03-24 NOTE — Progress Notes (Addendum)
No new orders. Will continue to monitor and recheck EKG this morning.   Dayanne Yiu, RN

## 2017-03-24 NOTE — Progress Notes (Signed)
PROGRESS NOTE    Ryan [name]  DVV:[id number] DOB: 10-Jan-1941 DOA: 03/23/2017 PCP: Marton Redwood, MD  Brief Narrative:Ryan [name] is a 76 y.o. male with medical history significant for coronary artery disease status post CABG, chronic systolic CHF, history of DVT and PE, nephrolithiasis, and hypertension who presented to the emergency department for evaluation of lethargy, nausea, lower abdominal pain, right flank pain, and gross hematuria. Patient reported becoming increasingly fatigued over the past 3-4 days with subjective fevers and chills, nausea, vomiting, right flank pain, and gross hematuria. On admission, found to have sepsis with UTI/Pyelo, CT abdomen renal stone study is notable for moderate right-sided hydronephrosis and mild to moderate right hydroureter with the 5 mm right kidney stone seen on the recent study no longer visualized there, but with a 5 mm stone now noted in the urinary bladder, suggesting recent passage. Also troponin elevated and d/w Cards on admission, who felt this is demand ischemia not ACS  Assessment & Plan:  1. Sepsis with UTI/Pyelonephritis - Continue IV ceftriaxone, follow up urine culture -CT abdomen with moderate hydronephrosis and hydroureter, with previously visualized stone now in the bladder which the patient was able to pass last night -We'll repeat a renal ultrasound tomorrow -Continue IV fluids, creatinine improving  2. Acute kidney injury  - SCr is 2.31 on admission, up from 1.0 last month  - CT abd findings suggest recent passage of stone from the right kidney with some residual hydroureteronephrosis; and dehydration -Improving, continue IV fluids today, hold Lasix, patient has past the stone  3. CAD, elevated troponin  - Pt with hx of CAD s/p CABG followed by cardiology  - Troponin is elevated to 0.21 -0.44 without anginal complaint  -  This discussed with cardiology on admission ; felt to represent a demand ischemia  -  Troponin  trended down to 0.28 from 0.44 last night,  --Recent echo with EF of 45% and regional wall motion abnormalities -Followed by Dr. Acie Fredrickson with cardiology, will need workup for this, not appropriate at this time the setting of renal failure  4. ?? Sigmoid diverticulitis   - ?CT-findings suggest sigmoid diverticulitis without perforation  - On Rocephin for UTI and Flagyl  - Supportive care with gentle IVF and analgesia, Improve now and tolerating diet  5. Chronic systolic CHF  - Pt appears dry on admission  - TTE (02/27/17) with EF 45%, mild LVH, focal areas of AK and HK, mild-mod AS, mod-sev LAE, severely decreased RV function  - Managed at home with Lasix 40 mg qD and Coreg  - Lasix is held given hypovolemia and AKI, continue coreg  6. P.Afib -chadsvasc score >3 -continue eliquis, concerns abt nausea from Amiodarone, will d/w Dr.[name], changed this to Qpm  7. Hypothyroidism  - Appears to be stable  - Continue Synthroid   DVT prophylaxis: Eliquis  Code Status: Full  Family Communication: none at bedside Disposition Plan: home in 2days  Consultants:   Antimicrobials:  Ceftriaxone/flagyl  Subjective: Still has nausea, otherwise improving  Objective: Vitals:   03/23/17 2251 03/24/17 0148 03/24/17 0708 03/24/17 1201  BP: 138/73 (!) 102/54 (!) 114/58 (!) 141/66  Pulse: 81 66 70 77  Resp: 20 20 20 20   Temp: 98.6 F (37 C) 98.5 F (36.9 C) 98.5 F (36.9 C) 98.2 F (36.8 C)  TempSrc: Oral Oral Oral Oral  SpO2: 92% 93% 92% 95%  Weight: 77.6 kg (171 lb)  77.7 kg (171 lb 3.2 oz)   Height: 5\' 6"  (1.676  m)       Intake/Output Summary (Last 24 hours) at 03/24/17 1248 Last data filed at 03/24/17 1052  Gross per 24 hour  Intake           2102.5 ml  Output             1000 ml  Net           1102.5 ml   Filed Weights   03/23/17 2251 03/24/17 0708  Weight: 77.6 kg (171 lb) 77.7 kg (171 lb 3.2 oz)    Examination:  General exam: Appears calm and comfortable    Respiratory system: Clear to auscultation. Respiratory effort normal. Cardiovascular system: S1 & S2 heard, RRR. No JVD, murmurs, rubs, gallops or clicks. No pedal edema. Gastrointestinal system: Abdomen is nondistended, soft and nontender. No organomegaly or masses felt. Normal bowel sounds heard. Central nervous system: Alert and oriented. No focal neurological deficits. Extremities: Symmetric 5 x 5 power. Skin: No rashes, lesions or ulcers Psychiatry: Judgement and insight appear normal. Mood & affect appropriate.     Data Reviewed:   CBC:  Recent Labs Lab 03/23/17 1542 03/24/17 0529  WBC 16.0* 8.4  HGB 13.6 10.9*  HCT 40.9 32.8*  MCV 93.6 92.1  PLT 304 798   Basic Metabolic Panel:  Recent Labs Lab 03/23/17 1542 03/24/17 0529  NA 134* 136  K 4.0 3.9  CL 98* 104  CO2 19* 23  GLUCOSE 150* 103*  BUN 24* 21*  CREATININE 2.31* 1.62*  CALCIUM 8.7* 8.0*   GFR: Estimated Creatinine Clearance: 38.7 mL/min (A) (by C-G formula based on SCr of 1.62 mg/dL (H)). Liver Function Tests: No results for input(s): AST, ALT, ALKPHOS, BILITOT, PROT, ALBUMIN in the last 168 hours. No results for input(s): LIPASE, AMYLASE in the last 168 hours. No results for input(s): AMMONIA in the last 168 hours. Coagulation Profile: No results for input(s): INR, PROTIME in the last 168 hours. Cardiac Enzymes:  Recent Labs Lab 03/23/17 1728 03/23/17 2338 03/24/17 0529 03/24/17 1001  TROPONINI 0.22* 0.44* 0.37* 0.28*   BNP (last 3 results) No results for input(s): PROBNP in the last 8760 hours. HbA1C: No results for input(s): HGBA1C in the last 72 hours. CBG: No results for input(s): GLUCAP in the last 168 hours. Lipid Profile: No results for input(s): CHOL, HDL, LDLCALC, TRIG, CHOLHDL, LDLDIRECT in the last 72 hours. Thyroid Function Tests: No results for input(s): TSH, T4TOTAL, FREET4, T3FREE, THYROIDAB in the last 72 hours. Anemia Panel: No results for input(s): VITAMINB12,  FOLATE, FERRITIN, TIBC, IRON, RETICCTPCT in the last 72 hours. Urine analysis:    Component Value Date/Time   COLORURINE RED (A) 03/23/2017 1619   APPEARANCEUR TURBID (A) 03/23/2017 1619   LABSPEC 1.013 03/23/2017 1619   PHURINE 5.0 03/23/2017 1619   GLUCOSEU 50 (A) 03/23/2017 1619   HGBUR MODERATE (A) 03/23/2017 1619   BILIRUBINUR NEGATIVE 03/23/2017 1619   KETONESUR NEGATIVE 03/23/2017 1619   PROTEINUR 100 (A) 03/23/2017 1619   UROBILINOGEN 1.0 04/04/2015 2320   NITRITE NEGATIVE 03/23/2017 1619   LEUKOCYTESUR MODERATE (A) 03/23/2017 1619   Sepsis Labs: @LABRCNTIP (procalcitonin:4,lacticidven:4)  )No results found for this or any previous visit (from the past 240 hour(s)).       Radiology Studies: Dg Chest 2 View  Result Date: 03/23/2017 CLINICAL DATA:  , shortness of breath and weakness for 2 weeks, had fluid drawn from his lungs 3 weeks ago, history coronary artery disease post MI and bypass surgery, former smoker, CHF, hypertension EXAM:  CHEST  2 VIEW COMPARISON:  02/26/2017 FINDINGS: Enlargement of cardiac silhouette post CABG. Atherosclerotic calcification aorta. Mediastinal contours and pulmonary vascularity normal. Bronchitic changes with chronic interstitial prominence, stable. No definite acute infiltrate, pleural effusion or pneumothorax. Bones demineralized with mild degenerative changes of the thoracic spine. Prior LEFT shoulder replacement with an again identified fractured screw at the inferior LEFT glenoid. IMPRESSION: Enlargement of cardiac silhouette post CABG. Aortic atherosclerosis. Chronic bronchitic changes and accentuation of interstitial markings without acute infiltrate. Electronically Signed   By: Lavonia Dana M.D.   On: 03/23/2017 16:22   Ct Renal Stone Study  Result Date: 03/23/2017 CLINICAL DATA:  Heart and sub scapular pain for 2 weeks history of kidney stones EXAM: CT ABDOMEN AND PELVIS WITHOUT CONTRAST TECHNIQUE: Multidetector CT imaging of the abdomen and  pelvis was performed following the standard protocol without IV contrast. COMPARISON:  10/19/2016, 02/26/2017 FINDINGS: Lower chest: Small bilateral pleural effusions. Calcified granuloma at the right middle lobe. Dependent atelectasis in the posterior lower lobes. The heart appears slightly enlarged. Coronary artery calcifications. Hepatobiliary: Stable hepatic cyst. Gallbladder surgically absent. No biliary dilatation. Pancreas: Unremarkable. No pancreatic ductal dilatation or surrounding inflammatory changes. Spleen: Normal in size without focal abnormality. Adrenals/Urinary Tract: Adrenal glands are within normal limits. Small left intrarenal calculus versus vascular calcification. Cyst lower pole left kidney. Previously noted stone in the upper pole of the right kidney is not identified. There is moderate hydronephrosis of right extrarenal pelvis. There is right hydroureter. There is a 4-5 mm stone visualized within the posterior aspect of the bladder. The bladder is otherwise normal. Stomach/Bowel: Stomach within normal. No dilated small bowel. No colon wall thickening. Sigmoid colon diverticula. Possible minimal edema/stranding within the central pelvis. Vascular/Lymphatic: Aortic atherosclerosis. No enlarged abdominal or pelvic lymph nodes. Reproductive: Enlarged prostate gland. Other: No free air or free fluid. Musculoskeletal: Multilevel degenerative changes of the spine. No acute or suspicious bone lesion. Left hip replacement with associated artifact IMPRESSION: 1. Moderate hydronephrosis of right extrarenal pelvis with mild to moderate right hydroureter. The previously noted stone in the upper pole of the right kidney is not visualized in the kidney; there is however a 5 mm stone present in the posterior aspect of the bladder, the constellation of findings would be consistent with recently passed kidney stone. 2. Sigmoid colon diverticular disease. Subtle inflammatory changes present within the central  pelvic near the distal sigmoid colon suggesting mild diverticulitis. No gross perforation. 3. Small bilateral pleural effusions Electronically Signed   By: Donavan Foil M.D.   On: 03/23/2017 19:19        Scheduled Meds: . amiodarone  200 mg Oral QPM  . apixaban  5 mg Oral BID  . carvedilol  3.125 mg Oral Daily  . cefTRIAXone (ROCEPHIN)  IV  1 g Intravenous Q24H  . levothyroxine  25 mcg Oral QAC breakfast  . metronidazole  500 mg Intravenous Q8H  . tamsulosin  0.4 mg Oral QHS   Continuous Infusions: . sodium chloride       LOS: 1 day    Time spent: 30min    Domenic Polite, MD [facility] Pager [phone]  If 7PM-7AM, please contact night-coverage [email or web address] Password [facility] 03/24/2017, 12:48 PM

## 2017-03-24 NOTE — Progress Notes (Signed)
Spoke in length to pt and wife about his at home medications, trying to deduce which medication is making him nauseous.  After notifying Dr. Broadus John, she spoke to wife.

## 2017-03-24 NOTE — Progress Notes (Signed)
New Admission Note:   Arrival Method: Bed  Telemetry:3e Box 6 Assessment: Completed Skin: Intact Pain:0/10 Tubes: None Safety Measures: Safety Fall Prevention Plan has been discussed  Admission:  [facility] Orientation: Patient has been orientated to the room, unit and staff.  Family: none at bedside  Orders to be reviewed and implemented. Will continue to monitor the patient. Call light has been placed within reach. Pt refused bed alarm.   Venetia Night, RN Phone: [phone]

## 2017-03-25 ENCOUNTER — Inpatient Hospital Stay (HOSPITAL_COMMUNITY): Payer: Medicare Other

## 2017-03-25 LAB — BASIC METABOLIC PANEL
ANION GAP: 9 (ref 5–15)
BUN: 18 mg/dL (ref 6–20)
CALCIUM: 8 mg/dL — AB (ref 8.9–10.3)
CO2: 25 mmol/L (ref 22–32)
Chloride: 102 mmol/L (ref 101–111)
Creatinine, Ser: 1.48 mg/dL — ABNORMAL HIGH (ref 0.61–1.24)
GFR, EST AFRICAN AMERICAN: 52 mL/min — AB (ref >60)
GFR, EST NON AFRICAN AMERICAN: 45 mL/min — AB (ref >60)
Glucose, Bld: 104 mg/dL — ABNORMAL HIGH (ref 65–99)
POTASSIUM: 4 mmol/L (ref 3.5–5.1)
Sodium: 136 mmol/L (ref 135–145)

## 2017-03-25 LAB — UREA NITROGEN, URINE: UREA NITROGEN UR: 431 mg/dL

## 2017-03-25 MED ORDER — ZOLPIDEM TARTRATE 5 MG PO TABS
5.0000 mg | ORAL_TABLET | Freq: Every evening | ORAL | Status: DC | PRN
  Administered 2017-03-25: 5 mg via ORAL
  Filled 2017-03-25: qty 1

## 2017-03-25 MED ORDER — METRONIDAZOLE 500 MG PO TABS
500.0000 mg | ORAL_TABLET | Freq: Three times a day (TID) | ORAL | Status: DC
  Administered 2017-03-25: 500 mg via ORAL
  Filled 2017-03-25: qty 1

## 2017-03-25 NOTE — Progress Notes (Addendum)
PROGRESS NOTE    Ryan [name]  EHM:[id number] DOB: Feb 02, 1941 DOA: 03/23/2017 PCP: Marton Redwood, MD  Brief Narrative:Ryan [name] is a 76 y.o. male with medical history significant for coronary artery disease status post CABG, chronic systolic CHF, history of DVT and PE, nephrolithiasis, and hypertension who presented to the emergency department for evaluation of lethargy, nausea, lower abdominal pain, right flank pain, and gross hematuria. Patient reported becoming increasingly fatigued over the past 3-4 days with subjective fevers and chills, nausea, vomiting, right flank pain, and gross hematuria. On admission, found to have sepsis with UTI/Pyelo, CT abdomen renal stone study is notable for moderate right-sided hydronephrosis and mild to moderate right hydroureter with the 5 mm right kidney stone seen on the recent study no longer visualized there, but with a 5 mm stone now noted in the urinary bladder, suggesting recent passage. Also troponin elevated and d/w Cards on admission, who felt this is demand ischemia not ACS  Assessment & Plan:  1. Sepsis with UTI/Pyelonephritis - Continue IV ceftriaxone, Urine culture with Ecoli will FU sensitivities -CT abdomen with moderate hydronephrosis and hydroureter, with previously visualized stone now in the bladder which the patient was able to pass 4/3 night -repeat a renal ultrasound today with residual hydronephrosis, d/w Dr.Patrick McEnzie, he reviewed chart/images and felt the residual hydronephrosis is likely related to edema from recent stone passage and recommended, 2 week follow up with Urology and repeat Renal US then. -stop IVF, creatinine improving  2. Acute kidney injury  - SCr is 2.31 on admission, up from 1.0 last month  - CT abd findings suggest recent passage of stone from the right kidney with some residual hydroureteronephrosis; and dehydration -Improving, stop IV fluids today, holding Lasix, patient has past the  stone -repeat Renal US today  3. CAD, elevated troponin  - Pt with hx of CAD s/p CABG followed by cardiology  - Troponin was elevated to 0.21 -0.44 without anginal complaint  - Troponin trended down to 0.28 from 0.44  --Recent echo with EF of 45% and regional wall motion abnormalities -Followed by Dr. Acie Fredrickson with cardiology, I called and d/w Dr.[name] cardiology regarding timing of ischemia eval, he recommended outpatient FU after improvement from this admission  4. ?? Sigmoid diverticulitis   - ?CT-findings suggest sigmoid diverticulitis without perforation  - On Rocephin for UTI and Flagyl, not convinced abt this, will stop Flagyl - Supportive care with gentle IVF and analgesia, Improve now and tolerating diet  5. Chronic systolic CHF  - Pt appears dry on admission  - TTE (02/27/17) with EF 45%, mild LVH, focal areas of AK and HK, mild-mod AS, mod-sev LAE, severely decreased RV function  - Managed at home with Lasix 40 mg qD and Coreg  - Lasix is held given hypovolemia and AKI, continue coreg  6. P.Afib -chadsvasc score >3 -continue eliquis, concerns abt nausea from Amiodarone,  d/w Dr.[name], changed this to Qpm -tolerating this better  7. Hypothyroidism  - Appears to be stable  - Continue Synthroid   DVT prophylaxis: Eliquis  Code Status: Full  Family Communication: none at bedside Disposition Plan: home in 1-2days  Consultants:   Antimicrobials:  Ceftriaxone/flagyl  Subjective: Nausea better  Objective: Vitals:   03/25/17 0143 03/25/17 0555 03/25/17 1109 03/25/17 1132  BP: 122/63 135/64 137/61 128/74  Pulse: 79 75 71 73  Resp: 18 18  18   Temp: 98 F (36.7 C) 99.3 F (37.4 C)  98.3 F (36.8 C)  TempSrc: Oral Oral  Oral  SpO2: 92% 94% 96% 94%  Weight:  77.4 kg (170 lb 9.6 oz)    Height:        Intake/Output Summary (Last 24 hours) at 03/25/17 1354 Last data filed at 03/25/17 1109  Gross per 24 hour  Intake          1866.25 ml  Output              1000 ml  Net           866.25 ml   Filed Weights   03/23/17 2251 03/24/17 0708 03/25/17 0555  Weight: 77.6 kg (171 lb) 77.7 kg (171 lb 3.2 oz) 77.4 kg (170 lb 9.6 oz)    Examination:  General exam: Appears calm and comfortable  Respiratory system: Clear to auscultation. Respiratory effort normal. Cardiovascular system: S1 & S2 heard, RRR. No JVD, murmurs, rubs, gallops or clicks. No pedal edema. Gastrointestinal system: Abdomen is nondistended, soft and nontender. No organomegaly or masses felt. Normal bowel sounds heard. Central nervous system: Alert and oriented. No focal neurological deficits. Extremities: Symmetric 5 x 5 power. Skin: No rashes, lesions or ulcers Psychiatry: Judgement and insight appear normal. Mood & affect appropriate.     Data Reviewed:   CBC:  Recent Labs Lab 03/23/17 1542 03/24/17 0529  WBC 16.0* 8.4  HGB 13.6 10.9*  HCT 40.9 32.8*  MCV 93.6 92.1  PLT 304 144   Basic Metabolic Panel:  Recent Labs Lab 03/23/17 1542 03/24/17 0529 03/25/17 0346  NA 134* 136 136  K 4.0 3.9 4.0  CL 98* 104 102  CO2 19* 23 25  GLUCOSE 150* 103* 104*  BUN 24* 21* 18  CREATININE 2.31* 1.62* 1.48*  CALCIUM 8.7* 8.0* 8.0*   GFR: Estimated Creatinine Clearance: 42.2 mL/min (A) (by C-G formula based on SCr of 1.48 mg/dL (H)). Liver Function Tests: No results for input(s): AST, ALT, ALKPHOS, BILITOT, PROT, ALBUMIN in the last 168 hours. No results for input(s): LIPASE, AMYLASE in the last 168 hours. No results for input(s): AMMONIA in the last 168 hours. Coagulation Profile: No results for input(s): INR, PROTIME in the last 168 hours. Cardiac Enzymes:  Recent Labs Lab 03/23/17 1728 03/23/17 2338 03/24/17 0529 03/24/17 1001  TROPONINI 0.22* 0.44* 0.37* 0.28*   BNP (last 3 results) No results for input(s): PROBNP in the last 8760 hours. HbA1C: No results for input(s): HGBA1C in the last 72 hours. CBG: No results for input(s): GLUCAP in the last 168  hours. Lipid Profile: No results for input(s): CHOL, HDL, LDLCALC, TRIG, CHOLHDL, LDLDIRECT in the last 72 hours. Thyroid Function Tests: No results for input(s): TSH, T4TOTAL, FREET4, T3FREE, THYROIDAB in the last 72 hours. Anemia Panel: No results for input(s): VITAMINB12, FOLATE, FERRITIN, TIBC, IRON, RETICCTPCT in the last 72 hours. Urine analysis:    Component Value Date/Time   COLORURINE RED (A) 03/23/2017 1619   APPEARANCEUR TURBID (A) 03/23/2017 1619   LABSPEC 1.013 03/23/2017 1619   PHURINE 5.0 03/23/2017 1619   GLUCOSEU 50 (A) 03/23/2017 1619   HGBUR MODERATE (A) 03/23/2017 1619   BILIRUBINUR NEGATIVE 03/23/2017 1619   KETONESUR NEGATIVE 03/23/2017 1619   PROTEINUR 100 (A) 03/23/2017 1619   UROBILINOGEN 1.0 04/04/2015 2320   NITRITE NEGATIVE 03/23/2017 1619   LEUKOCYTESUR MODERATE (A) 03/23/2017 1619   Sepsis Labs: @LABRCNTIP (procalcitonin:4,lacticidven:4)  ) Recent Results (from the past 240 hour(s))  Urine culture     Status: Abnormal (Preliminary result)   Collection Time: 03/23/17  4:19 PM  Result  Value Ref Range Status   Specimen Description URINE, CLEAN CATCH  Final   Special Requests NONE  Final   Culture (A)  Final    >=100,000 COLONIES/mL ESCHERICHIA COLI SUSCEPTIBILITIES TO FOLLOW    Report Status PENDING  Incomplete         Radiology Studies: Dg Chest 2 View  Result Date: 03/23/2017 CLINICAL DATA:  , shortness of breath and weakness for 2 weeks, had fluid drawn from his lungs 3 weeks ago, history coronary artery disease post MI and bypass surgery, former smoker, CHF, hypertension EXAM: CHEST  2 VIEW COMPARISON:  02/26/2017 FINDINGS: Enlargement of cardiac silhouette post CABG. Atherosclerotic calcification aorta. Mediastinal contours and pulmonary vascularity normal. Bronchitic changes with chronic interstitial prominence, stable. No definite acute infiltrate, pleural effusion or pneumothorax. Bones demineralized with mild degenerative changes of the  thoracic spine. Prior LEFT shoulder replacement with an again identified fractured screw at the inferior LEFT glenoid. IMPRESSION: Enlargement of cardiac silhouette post CABG. Aortic atherosclerosis. Chronic bronchitic changes and accentuation of interstitial markings without acute infiltrate. Electronically Signed   By: Lavonia Dana M.D.   On: 03/23/2017 16:22   US Renal  Result Date: 03/25/2017 CLINICAL DATA:  Right hydronephrosis.  Right renal stone EXAM: RENAL / URINARY TRACT ULTRASOUND COMPLETE COMPARISON:  CT abdomen pelvis 03/23/2017 FINDINGS: Right Kidney: Length: 11.0 cm. Mild to moderate right hydronephrosis. Normal echogenicity of the right renal cortex. No mass lesion. Left Kidney: Length: 12.3 cm. 2 cm cyst left lower pole. Negative for hydronephrosis or mass. Bladder: Normal bladder.  Enlarged prostate measuring 4.7 x 5.0 x 5.3 cm IMPRESSION: Mild to moderate right hydronephrosis. The patient recently had a stone in the distal right ureter which may still be present based on the amount of hydronephrosis. Electronically Signed   By: Franchot Gallo M.D.   On: 03/25/2017 09:24   Ct Renal Stone Study  Result Date: 03/23/2017 CLINICAL DATA:  Heart and sub scapular pain for 2 weeks history of kidney stones EXAM: CT ABDOMEN AND PELVIS WITHOUT CONTRAST TECHNIQUE: Multidetector CT imaging of the abdomen and pelvis was performed following the standard protocol without IV contrast. COMPARISON:  10/19/2016, 02/26/2017 FINDINGS: Lower chest: Small bilateral pleural effusions. Calcified granuloma at the right middle lobe. Dependent atelectasis in the posterior lower lobes. The heart appears slightly enlarged. Coronary artery calcifications. Hepatobiliary: Stable hepatic cyst. Gallbladder surgically absent. No biliary dilatation. Pancreas: Unremarkable. No pancreatic ductal dilatation or surrounding inflammatory changes. Spleen: Normal in size without focal abnormality. Adrenals/Urinary Tract: Adrenal glands are  within normal limits. Small left intrarenal calculus versus vascular calcification. Cyst lower pole left kidney. Previously noted stone in the upper pole of the right kidney is not identified. There is moderate hydronephrosis of right extrarenal pelvis. There is right hydroureter. There is a 4-5 mm stone visualized within the posterior aspect of the bladder. The bladder is otherwise normal. Stomach/Bowel: Stomach within normal. No dilated small bowel. No colon wall thickening. Sigmoid colon diverticula. Possible minimal edema/stranding within the central pelvis. Vascular/Lymphatic: Aortic atherosclerosis. No enlarged abdominal or pelvic lymph nodes. Reproductive: Enlarged prostate gland. Other: No free air or free fluid. Musculoskeletal: Multilevel degenerative changes of the spine. No acute or suspicious bone lesion. Left hip replacement with associated artifact IMPRESSION: 1. Moderate hydronephrosis of right extrarenal pelvis with mild to moderate right hydroureter. The previously noted stone in the upper pole of the right kidney is not visualized in the kidney; there is however a 5 mm stone present in the posterior aspect of  the bladder, the constellation of findings would be consistent with recently passed kidney stone. 2. Sigmoid colon diverticular disease. Subtle inflammatory changes present within the central pelvic near the distal sigmoid colon suggesting mild diverticulitis. No gross perforation. 3. Small bilateral pleural effusions Electronically Signed   By: Donavan Foil M.D.   On: 03/23/2017 19:19        Scheduled Meds: . amiodarone  200 mg Oral QPM  . apixaban  5 mg Oral BID  . carvedilol  3.125 mg Oral Daily  . cefTRIAXone (ROCEPHIN)  IV  1 g Intravenous Q24H  . levothyroxine  25 mcg Oral QAC breakfast  . metroNIDAZOLE  500 mg Oral Q8H  . tamsulosin  0.4 mg Oral QHS   Continuous Infusions:    LOS: 2 days    Time spent: 64min  Domenic Polite, MD [facility] Pager  [phone]  If 7PM-7AM, please contact night-coverage [email or web address] Password [email or web address] 03/25/2017, 1:54 PM

## 2017-03-25 NOTE — Progress Notes (Signed)
Patient stable during 7 a to 7 p shift without complaint of pain or other.  Patient feels he is ready to go home.  Patient was not using urinal earlier in the shift and states he urinated a couple of times, RN reminded him to use his urinal and patient noted to only be urinating small amounts of dark (tea colored) urine.  Text page sent to MD regarding this.

## 2017-03-26 LAB — CBC
HCT: 34.4 % — ABNORMAL LOW (ref 39.0–52.0)
Hemoglobin: 11.4 g/dL — ABNORMAL LOW (ref 13.0–17.0)
MCH: 30.4 pg (ref 26.0–34.0)
MCHC: 33.1 g/dL (ref 30.0–36.0)
MCV: 91.7 fL (ref 78.0–100.0)
Platelets: 297 10*3/uL (ref 150–400)
RBC: 3.75 MIL/uL — AB (ref 4.22–5.81)
RDW: 14.1 % (ref 11.5–15.5)
WBC: 8.9 10*3/uL (ref 4.0–10.5)

## 2017-03-26 LAB — BASIC METABOLIC PANEL
Anion gap: 10 (ref 5–15)
BUN: 16 mg/dL (ref 6–20)
CALCIUM: 7.8 mg/dL — AB (ref 8.9–10.3)
CO2: 27 mmol/L (ref 22–32)
CREATININE: 1.41 mg/dL — AB (ref 0.61–1.24)
Chloride: 100 mmol/L — ABNORMAL LOW (ref 101–111)
GFR calc Af Amer: 55 mL/min — ABNORMAL LOW (ref >60)
GFR calc non Af Amer: 47 mL/min — ABNORMAL LOW (ref >60)
Glucose, Bld: 115 mg/dL — ABNORMAL HIGH (ref 65–99)
Potassium: 3.5 mmol/L (ref 3.5–5.1)
SODIUM: 137 mmol/L (ref 135–145)

## 2017-03-26 LAB — URINE CULTURE

## 2017-03-26 MED ORDER — POTASSIUM CHLORIDE CRYS ER 20 MEQ PO TBCR
40.0000 meq | EXTENDED_RELEASE_TABLET | Freq: Every day | ORAL | Status: DC
  Administered 2017-03-26: 40 meq via ORAL
  Filled 2017-03-26: qty 2

## 2017-03-26 MED ORDER — CEPHALEXIN 250 MG PO CAPS: 250.0000 mg | ORAL_CAPSULE | Freq: Four times a day (QID) | ORAL | 0 refills | Status: DC

## 2017-03-26 MED ORDER — AMIODARONE HCL 200 MG PO TABS: 200.0000 mg | ORAL_TABLET | Freq: Every day | ORAL | Status: DC

## 2017-03-26 MED ORDER — FUROSEMIDE 40 MG PO TABS
40.0000 mg | ORAL_TABLET | Freq: Every day | ORAL | Status: DC
  Administered 2017-03-26: 40 mg via ORAL
  Filled 2017-03-26: qty 1

## 2017-03-26 MED ORDER — POTASSIUM CHLORIDE CRYS ER 20 MEQ PO TBCR: 40.0000 meq | EXTENDED_RELEASE_TABLET | Freq: Every day | ORAL | 0 refills | Status: DC

## 2017-03-26 NOTE — Progress Notes (Signed)
Patient with no complaints or concerns during 7pm - 7am shift.Slept during the night. Patient still produces small dark( tea colored) urine.   Will continue to monitor.   Soffia Doshier, RN

## 2017-03-26 NOTE — Discharge Summary (Signed)
Physician Discharge Summary  Ryan [name]:[id number] DOB: 02-Feb-1941 DOA: 03/23/2017  PCP: Marton Redwood, MD  Admit date: 03/23/2017 Discharge date: 03/26/2017  Time spent: 35 minutes  Recommendations for Outpatient Follow-up:  1. PCP Dr.Shaw in 1 week 2. Urology Dr.Patrick Titus Mould or associates on 4/18 3. Dr.Philip [name] on 4/24, needs ischemia evaluation   Discharge Diagnoses:    Sepsis   Acute pyelonephritis   Hydronephrosis   Passed stone   HTN (hypertension)   Coronary artery disease involving native coronary artery of native heart without angina pectoris   S/P CABG x 4   Chronic systolic CHF (congestive heart failure) (HCC)   Hypothyroidism   Elevated troponin   Sigmoid diverticulitis   AKI (acute kidney injury) [facility])   Discharge Condition: stable  Diet recommendation: heart healthy  Filed Weights   03/24/17 0708 03/25/17 0555 03/26/17 0501  Weight: 77.7 kg (171 lb 3.2 oz) 77.4 kg (170 lb 9.6 oz) 77.5 kg (170 lb 12.8 oz)    History of present illness:  Ryan Cocuzza Kendrickis a 76 y.o.malewith medical history significant forcoronary artery disease status post CABG, chronic systolic CHF, history of DVT and PE, nephrolithiasis, and hypertension who presented to the emergency department for evaluation of lethargy, nausea, lower abdominal pain, right flank pain, and gross hematuria. Patient reported becoming increasingly fatigued over the past 3-4 days with subjective fevers and chills, nausea, vomiting, right flank pain, and gross hematuria. on admission, found to have sepsis with UTI/Pyelo, CT abdomen renal stone study is notable for moderate right-sided hydronephrosis and mild to moderate right hydroureter with the 5 mm right kidney stone seen on the recent study no longer visualized there, but with a 5 mm stone now noted in the urinary bladder,suggesting recent passage.  Hospital Course:   1. Sepsis with UTI/Pyelonephritis - Treated with IV ceftriaxone, FLuids,  supportive care - Urine culture grew Ecoli which was pansensitive -CT abdomen with moderate hydronephrosis and hydroureter, with previously visualized stone now in the bladder which the patient was able to pass on 4/3 night in the hospital -repeat a renal ultrasound 4/5 with residual hydronephrosis, d/w Dr.Patrick McEnzie, he reviewed chart/images and felt the residual hydronephrosis is likely related to edema from recent stone passage and recommended, 2 week follow up with Urology and repeat Renal US then. -discharged home on Oral keflex  2. Acute kidney injury  - SCr is 2.31 on admission, up from 1.0 last month  - CT abd findings suggest recent passage of stone from the right kidney with some residual hydroureteronephrosis; and dehydration -Improved to 1.4 at discharge -needs FU renal US at the time of Urology Follow up  3. CAD, elevated troponin  - Pt with hx of CAD s/p CABG followed by cardiology Dr.[name] as outpatient - Troponin was elevated to 0.21 -0.44 without anginal complaint, troponin trended down to 0.28 from 0.44  - Recent echo with EF of 45% and regional wall motion abnormalities -Followed by Dr. Acie Fredrickson with cardiology, I called and d/w Dr.[name] cardiology regarding timing of ischemia eval, he recommended outpatient FU after improvement from this admission, set up office FU for 4/24  4. ?? Sigmoid diverticulitis  - ?CT-findings suggest mild sigmoid diverticulitis  - On Rocephin for UTI and was started on Flagyl, not convinced abt this, due to lack of clinical symptoms,  stopped Flagyl - Stable and tolerating diet at the time of discharge  5. Chronic systolic CHF - Pt appeared dry on admission  - TTE (02/27/17) with EF 45%,  mild LVH, focal areas of AK and HK, mild-mod AS, mod-sev LAE, severely decreased RV function  - Managed at home with Lasix 40 mg qD and Coreg  - Lasix held initially given hypovolemia and AKI, continue coreg, improved, lasix resumed  6.  P.Afib -chadsvasc score >3 -continue eliquis, concerns abt nausea from Amiodarone,  d/w Dr.[name], changed this to Qpm  -tolerating this better now  7. Hypothyroidism  - Appears to be stable  - Continue Synthroid    Procedures:    Consultations:  D/w Urology Dr.Patrick Titus Mould  D/w Cards Dr.Phillip [name]  Discharge Exam: Vitals:   03/26/17 0501 03/26/17 1000  BP: 134/64 (!) 165/70  Pulse: 71 85  Resp: 16 19  Temp: 98.6 F (37 C)     General: AAOx3 Cardiovascular: S1S2/RRR Respiratory: CTAB  Discharge Instructions   Discharge Instructions    Diet - low sodium heart healthy    Complete by:  As directed    Increase activity slowly    Complete by:  As directed      Discharge Medication List as of 03/26/2017 11:42 AM    START taking these medications   Details  cephALEXin (KEFLEX) 250 MG capsule Take 1 capsule (250 mg total) by mouth 4 (four) times daily. For 5days, Starting Fri 03/26/2017, Print    potassium chloride SA (K-DUR,KLOR-CON) 20 MEQ tablet Take 2 tablets (40 mEq total) by mouth daily., Starting Fri 03/26/2017, Print      CONTINUE these medications which have CHANGED   Details  amiodarone (PACERONE) 200 MG tablet Take 1 tablet (200 mg total) by mouth at bedtime. Take this at night instead of morning, Starting Fri 03/26/2017, No Print      CONTINUE these medications which have NOT CHANGED   Details  apixaban (ELIQUIS) 5 MG TABS tablet Take 1 tablet (5 mg total) by mouth 2 (two) times daily., Starting Mon 03/01/2017, Normal    carvedilol (COREG) 3.125 MG tablet Take 3.125 mg by mouth daily., Starting Wed 12/16/2016, Historical Med    docusate sodium (COLACE) 100 MG capsule Take 100 mg by mouth daily. , Historical Med    furosemide (LASIX) 40 MG tablet Take 1 tablet (40 mg total) by mouth daily., Starting Mon 03/01/2017, Normal    HYDROcodone-acetaminophen (NORCO/VICODIN) 5-325 MG tablet Take 1 tablet by mouth every 6 (six) hours as needed for moderate  pain., Starting Tue 07/28/2016, Print    levothyroxine (SYNTHROID, LEVOTHROID) 25 MCG tablet Take 1 tablet (25 mcg total) by mouth daily before breakfast., Starting Tue 03/02/2017, Normal    methocarbamol (ROBAXIN) 500 MG tablet Take 1 tablet (500 mg total) by mouth every 8 (eight) hours as needed for muscle spasms., Starting Mon 03/01/2017, Print    nitroGLYCERIN (NITROSTAT) 0.4 MG SL tablet Place 1 tablet (0.4 mg total) under the tongue every 5 (five) minutes as needed for chest pain., Starting Wed 05/27/2016, Normal    tamsulosin (FLOMAX) 0.4 MG CAPS capsule Take 0.4 mg by mouth at bedtime., Starting Mon 03/01/2017, Historical Med      STOP taking these medications     colchicine 0.6 MG tablet        Allergies  Allergen Reactions  . Codeine Nausea And Vomiting       . Zetia [Ezetimibe] Other (See Comments)    Myalgias    Follow-up Information    Marton Redwood, MD.   Specialty:  Internal Medicine Why:  Left message with office to call patient with follow up appointment  Contact information: [id number] [place] [address] [facility] On 04/07/2017.   Why:  At 9:15am for hospital follow up  Contact information: [place] [address] [phone]        Mertie Moores, MD On 04/13/2017.   Specialty:  Cardiology Why:  At 10:30am for hospital follow up  Contact information: [place] [address] [phone]            The results of significant diagnostics from this hospitalization (including imaging, microbiology, ancillary and laboratory) are listed below for reference.    Significant Diagnostic Studies: Dg Chest 2 View  Result Date: 03/23/2017 CLINICAL DATA:  , shortness of breath and weakness for 2 weeks, had fluid drawn from his lungs 3 weeks ago, history coronary artery disease post MI and bypass surgery, former smoker, CHF, hypertension EXAM: CHEST  2 VIEW  COMPARISON:  02/26/2017 FINDINGS: Enlargement of cardiac silhouette post CABG. Atherosclerotic calcification aorta. Mediastinal contours and pulmonary vascularity normal. Bronchitic changes with chronic interstitial prominence, stable. No definite acute infiltrate, pleural effusion or pneumothorax. Bones demineralized with mild degenerative changes of the thoracic spine. Prior LEFT shoulder replacement with an again identified fractured screw at the inferior LEFT glenoid. IMPRESSION: Enlargement of cardiac silhouette post CABG. Aortic atherosclerosis. Chronic bronchitic changes and accentuation of interstitial markings without acute infiltrate. Electronically Signed   By: Lavonia Dana M.D.   On: 03/23/2017 16:22   Dg Chest 2 View  Result Date: 02/26/2017 CLINICAL DATA:  Chest pain and shortness of breath EXAM: CHEST  2 VIEW COMPARISON:  05/07/2015 FINDINGS: Status post CABG.Cardiomegaly and pulmonary vessel congestion. No notable vascular pedicle widening. Trace effusions. No air bronchogram. No pneumothorax. Glenohumeral arthroplasty. Chronic fracture of a left glenoid screw. IMPRESSION: Cardiomegaly, trace effusions, and pulmonary vascular congestion. Electronically Signed   By: Monte Fantasia M.D.   On: 02/26/2017 14:01   Dg Wrist Complete Right  Result Date: 02/28/2017 CLINICAL DATA:  Acute right wrist pain.  No reported injury. EXAM: RIGHT WRIST - COMPLETE 3+ VIEW COMPARISON:  None. FINDINGS: No fracture or dislocation. No suspicious focal osseous lesion. Mild osteoarthritis at the first carpometacarpal joint. Mild-to-moderate osteoarthritis at the first metacarpophalangeal joint. No appreciable erosive arthropathy. Probable chondrocalcinosis in the triangular fibrocartilage. Vascular calcifications throughout the soft tissues. IMPRESSION: 1. No fracture or dislocation. 2. Mild to moderate polyarticular osteoarthritis. 3. Probable chondrocalcinosis in the TFCC, suggesting CPPD arthropathy .  Electronically Signed   By: Ilona Sorrel M.D.   On: 02/28/2017 09:12   US Renal  Result Date: 03/25/2017 CLINICAL DATA:  Right hydronephrosis.  Right renal stone EXAM: RENAL / URINARY TRACT ULTRASOUND COMPLETE COMPARISON:  CT abdomen pelvis 03/23/2017 FINDINGS: Right Kidney: Length: 11.0 cm. Mild to moderate right hydronephrosis. Normal echogenicity of the right renal cortex. No mass lesion. Left Kidney: Length: 12.3 cm. 2 cm cyst left lower pole. Negative for hydronephrosis or mass. Bladder: Normal bladder.  Enlarged prostate measuring 4.7 x 5.0 x 5.3 cm IMPRESSION: Mild to moderate right hydronephrosis. The patient recently had a stone in the distal right ureter which may still be present based on the amount of hydronephrosis. Electronically Signed   By: Franchot Gallo M.D.   On: 03/25/2017 09:24   Ct Angio Chest Aorta W/cm &/or Wo/cm  Result Date: 02/26/2017 CLINICAL DATA:  Intermittent chest pain shortness of breath. Intermittent back pain. Clinical concern for aortic dissection. EXAM: CT ANGIOGRAPHY CHEST  WITH CONTRAST TECHNIQUE: Multidetector CT imaging of the chest was performed using the standard protocol during bolus administration of intravenous contrast. Multiplanar CT image reconstructions and MIPs were obtained to evaluate the vascular anatomy. CONTRAST:  100 cc Isovue 370 COMPARISON:  10/19/2016 FINDINGS: Cardiovascular: Heart size is enlarged no pericardial effusion. Patient is status post CABG. Precontrast imaging shows no hyperdense crescent in the wall of the thoracic aorta to suggest acute intramural hematoma. Imaging after IV contrast administration shows no dissection flap in the lumen of the thoracic aorta. Bovine arch vessel anatomy is evident with patency of the arch vessels noted. There is no evidence for large central pulmonary embolus. No lobar pulmonary embolus. No segmental pulmonary embolic disease is identified. Mediastinum/Nodes: No mediastinal lymphadenopathy. There is no hilar  lymphadenopathy. The esophagus has normal imaging features. There is no axillary lymphadenopathy. Lungs/Pleura: Calcified granuloma right middle lobe 4 mm subpleural right middle lobe pulmonary nodule seen image 90 series a. there is some compressive atelectasis in the lower lobes bilaterally. Small bilateral pleural effusions are evident. Upper Abdomen: 5 mm nonobstructing stone identified upper pole right kidney 8 mm low-density lesion in the medial segment left liver is unchanged. Musculoskeletal: Bone windows reveal no worrisome lytic or sclerotic osseous lesions. Patient is status post left shoulder replacement. Review of the MIP images confirms the above findings. IMPRESSION: 1. No evidence for thoracic aortic dissection. 2. No large central pulmonary embolus. 3. Bilateral lower lobe atelectasis with small bilateral pleural effusions. 4. 4 mm subpleural right middle lobe pulmonary nodule. If the patient is at high risk for bronchogenic carcinoma, follow-up chest CT at 1 year is recommended. If the patient is at low risk, no follow-up is needed. This recommendation follows the consensus statement: Guidelines for Management of Small Pulmonary Nodules Detected on CT Scans: A Statement from the [place] as published in Radiology 2005; 237:395-400. 5. 5 mm nonobstructing right renal stone. Electronically Signed   By: Misty Stanley M.D.   On: 02/26/2017 18:33   Ct Renal Stone Study  Result Date: 03/23/2017 CLINICAL DATA:  Heart and sub scapular pain for 2 weeks history of kidney stones EXAM: CT ABDOMEN AND PELVIS WITHOUT CONTRAST TECHNIQUE: Multidetector CT imaging of the abdomen and pelvis was performed following the standard protocol without IV contrast. COMPARISON:  10/19/2016, 02/26/2017 FINDINGS: Lower chest: Small bilateral pleural effusions. Calcified granuloma at the right middle lobe. Dependent atelectasis in the posterior lower lobes. The heart appears slightly enlarged. Coronary artery  calcifications. Hepatobiliary: Stable hepatic cyst. Gallbladder surgically absent. No biliary dilatation. Pancreas: Unremarkable. No pancreatic ductal dilatation or surrounding inflammatory changes. Spleen: Normal in size without focal abnormality. Adrenals/Urinary Tract: Adrenal glands are within normal limits. Small left intrarenal calculus versus vascular calcification. Cyst lower pole left kidney. Previously noted stone in the upper pole of the right kidney is not identified. There is moderate hydronephrosis of right extrarenal pelvis. There is right hydroureter. There is a 4-5 mm stone visualized within the posterior aspect of the bladder. The bladder is otherwise normal. Stomach/Bowel: Stomach within normal. No dilated small bowel. No colon wall thickening. Sigmoid colon diverticula. Possible minimal edema/stranding within the central pelvis. Vascular/Lymphatic: Aortic atherosclerosis. No enlarged abdominal or pelvic lymph nodes. Reproductive: Enlarged prostate gland. Other: No free air or free fluid. Musculoskeletal: Multilevel degenerative changes of the spine. No acute or suspicious bone lesion. Left hip replacement with associated artifact IMPRESSION: 1. Moderate hydronephrosis of right extrarenal pelvis with mild to moderate right hydroureter. The previously noted stone in the  upper pole of the right kidney is not visualized in the kidney; there is however a 5 mm stone present in the posterior aspect of the bladder, the constellation of findings would be consistent with recently passed kidney stone. 2. Sigmoid colon diverticular disease. Subtle inflammatory changes present within the central pelvic near the distal sigmoid colon suggesting mild diverticulitis. No gross perforation. 3. Small bilateral pleural effusions Electronically Signed   By: Donavan Foil M.D.   On: 03/23/2017 19:19    Microbiology: Recent Results (from the past 240 hour(s))  Urine culture     Status: Abnormal   Collection Time:  03/23/17  4:19 PM  Result Value Ref Range Status   Specimen Description URINE, CLEAN CATCH  Final   Special Requests NONE  Final   Culture >=100,000 COLONIES/mL ESCHERICHIA COLI (A)  Final   Report Status 03/26/2017 FINAL  Final   Organism ID, Bacteria ESCHERICHIA COLI (A)  Final      Susceptibility   Escherichia coli - MIC*    AMPICILLIN <=2 SENSITIVE Sensitive     CEFAZOLIN <=4 SENSITIVE Sensitive     CEFTRIAXONE <=1 SENSITIVE Sensitive     CIPROFLOXACIN <=0.25 SENSITIVE Sensitive     GENTAMICIN <=1 SENSITIVE Sensitive     IMIPENEM <=0.25 SENSITIVE Sensitive     NITROFURANTOIN <=16 SENSITIVE Sensitive     TRIMETH/SULFA <=20 SENSITIVE Sensitive     AMPICILLIN/SULBACTAM <=2 SENSITIVE Sensitive     PIP/TAZO <=4 SENSITIVE Sensitive     Extended ESBL NEGATIVE Sensitive     * >=100,000 COLONIES/mL ESCHERICHIA COLI     Labs: Basic Metabolic Panel:  Recent Labs Lab 03/23/17 1542 03/24/17 0529 03/25/17 0346 03/26/17 0427  NA 134* 136 136 137  K 4.0 3.9 4.0 3.5  CL 98* 104 102 100*  CO2 19* 23 25 27   GLUCOSE 150* 103* 104* 115*  BUN 24* 21* 18 16  CREATININE 2.31* 1.62* 1.48* 1.41*  CALCIUM 8.7* 8.0* 8.0* 7.8*   Liver Function Tests: No results for input(s): AST, ALT, ALKPHOS, BILITOT, PROT, ALBUMIN in the last 168 hours. No results for input(s): LIPASE, AMYLASE in the last 168 hours. No results for input(s): AMMONIA in the last 168 hours. CBC:  Recent Labs Lab 03/23/17 1542 03/24/17 0529 03/26/17 0427  WBC 16.0* 8.4 8.9  HGB 13.6 10.9* 11.4*  HCT 40.9 32.8* 34.4*  MCV 93.6 92.1 91.7  PLT 304 229 297   Cardiac Enzymes:  Recent Labs Lab 03/23/17 1728 03/23/17 2338 03/24/17 0529 03/24/17 1001  TROPONINI 0.22* 0.44* 0.37* 0.28*   BNP: BNP (last 3 results)  Recent Labs  02/26/17 1209  BNP 535.5*    ProBNP (last 3 results) No results for input(s): PROBNP in the last 8760 hours.  CBG: No results for input(s): GLUCAP in the last 168  hours.     SignedDomenic Polite MD.  [facility] 03/26/2017, 5:49 PM

## 2017-03-26 NOTE — Progress Notes (Signed)
Pt discharge education went over at bedside with pt and pt wife. Pt IV discontinued, catheter intact and pt telemetry removed. Pt has all belongings, discharge paper work and prescriptions. Pt discharged via wheelchair with volunteer.  Ryan [name]

## 2017-03-26 NOTE — Care Management Important Message (Signed)
Important Message  Patient Details  Name: Ryan [name] MRN: [id number] Date of Birth: 07/22/1941   Medicare Important Message Given:  Yes    Orbie Pyo 03/26/2017, 2:39 PM

## 2017-04-01 ENCOUNTER — Other Ambulatory Visit: Payer: Self-pay

## 2017-04-01 NOTE — Patient Outreach (Addendum)
[facility]) Care Management  04/01/2017  Ryan [name] 1941/01/19 [id number]      EMMI-HF RED ON EMMI ALERT Day # 2 Date: 03/31/17 Red Alert Reason: "Weighed themselves today? No"   Outreach attempt #1 to patient. Spoke with patient. Reviewed and addressed red alert. Patient reported that he has been weighing daily. Denies any changes in his weight. Patient reports no edema and no SOB. He states that he has been having some constipation since return home. His wife contacted his PCP office today and was advised to increase Miralax dosage until BM. Patient states that Miralax does not give him good results. He has already told his spouse to go get him some OTC suppositories. RN CM provided patient with education on non pharmacological measures to assist with producing BM. He voiced understanding and will try measures. Patient has PCP f/u appt on next week. He has been advised to contact MD office if no BM within the next 24hrs. Advised patient that they would continue to get automated EMMI- HF post discharge calls to assess how they are doing following recent hospitalization and will receive a call from a nurse if any of their responses were abnormal. Patient voiced understanding and was appreciative of f/u call.     Plan: RN CM will notify [facility] administrative assistant of case status.   Enzo Montgomery, RN,BSN,CCM [facility] Telephonic Care Management Coordinator Direct Phone: [phone] Toll Free: [phone] Fax: [phone]

## 2017-04-07 DIAGNOSIS — M179 Osteoarthritis of knee, unspecified: Secondary | ICD-10-CM | POA: Diagnosis not present

## 2017-04-07 NOTE — Progress Notes (Deleted)
Cardiology Office Note    Date:  04/07/2017   ID:  Ryan [name], DOB 05/14/1941, MRN [id number]  PCP:  Marton Redwood, MD  Cardiologist:  Dr. Acie Fredrickson  Chief Complaint: Hospital follow up for elevated troponin  History of Present Illness:   Ryan [name] is a 76 y.o. male with hx of HTN, HLD, CAD s/p CABG, PE, DVT, carotid artery diease s/p L CEA, PAF , AS, chronic systolic CHF( EF of 08% on echo 3/18) presented for hospital follow ip.  Admitted 02/2017 for worsening dyspnea. He was found to have new onset afib. Remained on sinus rhythm on amiodarone. TSH was elevated (hypothyroidism) and has likely contributed to atrial fibrillation. He was also found to have EF of 45%, felt due to afib.   He was doing on cardiac stand point when last seen by Dr. Acie Fredrickson 03/10/17 for hospital follow up.   Admitted 4/3-4/6 for sepsis with UTI/pyelonephritis with ? Mild sigmoid diverticulitis. Treated with Abx. Repeat a renal ultrasound 4/5 with residual hydronephrosis --> recommended outpatient follow up. Troponin trend was 0.21-->.044-->.028. Discussed with  Dr. Acie Fredrickson (did not saw the patient) who felt demand ischemia and advised outpatient follow up. Amiodarone changed to PM due to nausea with improved symptoms.   HAve TSH check with PCP? Any angina?    Past Medical History:  Diagnosis Date  . Arthritis   . CAD (coronary artery disease)   . Carotid artery occlusion   . Cataract    Bil eyes/worse in left eye  . CHF (congestive heart failure) ([place])   . Chronic back pain   . DVT (deep venous thrombosis) ([place])   . Dysrhythmia   . Enlarged prostate    takes Rapaflo daily  . GERD (gastroesophageal reflux disease)    occasional  . History of colon polyps   . History of gout    has colchicine prn  . History of kidney stones   . Hyperlipidemia    takes Crestor daily  . Hypertension    takes Amlodipine daily  . Hypothyroidism   . Myocardial infarction ([place])   . Peripheral vascular  disease ([place])   . Pulmonary emboli ([place]) 03/20/2015   elevated d-dimer, intermediate V/Q study, atypical chest pain and SOB. Start on Xarelto 20mg  BID for 3 month  . Renal insufficiency   . Shortness of breath dyspnea   . Urinary frequency   . Urinary urgency     Past Surgical History:  Procedure Laterality Date  . APPENDECTOMY    . BACK SURGERY     5 times  . big toe surgery    . CARDIAC CATHETERIZATION     2010    dr Acie Fredrickson  . cataract surgery     left eye  . CHOLECYSTECTOMY N/A 07/27/2016   Procedure: LAPAROSCOPIC CHOLECYSTECTOMY;  Surgeon: Mickeal Skinner, MD;  Location: [facility] [facility];  Service: General;  Laterality: N/A;  . COLONOSCOPY    . CORONARY ARTERY BYPASS GRAFT N/A 04/05/2015   Procedure: CORONARY ARTERY BYPASS GRAFTING (CABG)X4 LIMA-LAD; SVG-DIAG1-DIAG2; SVG-PD;  Surgeon: [name], MD;  Location: [facility];  Service: Open Heart Surgery;  Laterality: N/A;  . CYSTOSCOPY    . ENDARTERECTOMY Left 04/24/2016   Procedure: ENDARTERECTOMY LEFT CAROTID;  Surgeon: Mal Misty, MD;  Location: [facility];  Service: Vascular;  Laterality: Left;  . EYE SURGERY    . FEMORAL ARTERY - POPLITEAL ARTERY BYPASS GRAFT    . JOINT REPLACEMENT     shoulder  .  LEFT HEART CATHETERIZATION WITH CORONARY ANGIOGRAM N/A 04/03/2015   Procedure: LEFT HEART CATHETERIZATION WITH CORONARY ANGIOGRAM;  Surgeon: Troy Sine, MD;  Location: [facility] CATH LAB;  Service: Cardiovascular;  Laterality: N/A;  . LUMBAR LAMINECTOMY  01/06/2013   Procedure: MICRODISCECTOMY LUMBAR LAMINECTOMY;  Surgeon: Marybelle Killings, MD;  Location: [place];  Service: Orthopedics;  Laterality: N/A;  L3-4 decompression  . LUMBAR LAMINECTOMY/DECOMPRESSION MICRODISCECTOMY  02/12/2012   Procedure: LUMBAR LAMINECTOMY/DECOMPRESSION MICRODISCECTOMY;  Surgeon: Floyce Stakes, MD;  Location: [place] NEUR[facility] ORS;  Service: Neurosurgery;  Laterality: N/A;  Lumbar four-five laminectomy  . PATCH ANGIOPLASTY Left 04/24/2016   Procedure: LEFT CAROTID ARTERY PATCH  ANGIOPLASTY;  Surgeon: Mal Misty, MD;  Location: [facility];  Service: Vascular;  Laterality: Left;  . STERIOD INJECTION Right 01/09/2014   Procedure: STEROID INJECTION;  Surgeon: Mcarthur Rossetti, MD;  Location: [facility];  Service: Orthopedics;  Laterality: Right;  . TEE WITHOUT CARDIOVERSION N/A 04/05/2015   Procedure: TRANSESOPHAGEAL ECHOCARDIOGRAM (TEE);  Surgeon: [name], MD;  Location: [facility];  Service: Open Heart Surgery;  Laterality: N/A;  . TOTAL HIP ARTHROPLASTY Left 01/09/2014   DR Ninfa Linden  . TOTAL HIP ARTHROPLASTY Left 01/09/2014   Procedure: LEFT TOTAL HIP ARTHROPLASTY ANTERIOR APPROACH and Steroid Injection Right hip;  Surgeon: Mcarthur Rossetti, MD;  Location: [facility];  Service: Orthopedics;  Laterality: Left;    Current Medications: Prior to Admission medications   Medication Sig Start Date End Date Taking? Authorizing Provider  amiodarone (PACERONE) 200 MG tablet Take 1 tablet (200 mg total) by mouth at bedtime. Take this at night instead of morning 03/26/17   Domenic Polite, MD  apixaban (ELIQUIS) 5 MG TABS tablet Take 1 tablet (5 mg total) by mouth 2 (two) times daily. 03/01/17   Reyne Dumas, MD  carvedilol (COREG) 3.125 MG tablet Take 3.125 mg by mouth daily. 12/16/16   Historical Provider, MD  cephALEXin (KEFLEX) 250 MG capsule Take 1 capsule (250 mg total) by mouth 4 (four) times daily. For 5days 03/26/17   Domenic Polite, MD  docusate sodium (COLACE) 100 MG capsule Take 100 mg by mouth daily.     Historical Provider, MD  furosemide (LASIX) 40 MG tablet Take 1 tablet (40 mg total) by mouth daily. 03/01/17   Reyne Dumas, MD  HYDROcodone-acetaminophen (NORCO/VICODIN) 5-325 MG tablet Take 1 tablet by mouth every 6 (six) hours as needed for moderate pain. 07/28/16   Mickeal Skinner, MD  levothyroxine (SYNTHROID, LEVOTHROID) 25 MCG tablet Take 1 tablet (25 mcg total) by mouth daily before breakfast. 03/02/17   Reyne Dumas, MD  methocarbamol (ROBAXIN) 500 MG  tablet Take 1 tablet (500 mg total) by mouth every 8 (eight) hours as needed for muscle spasms. 03/01/17   Reyne Dumas, MD  nitroGLYCERIN (NITROSTAT) 0.4 MG SL tablet Place 1 tablet (0.4 mg total) under the tongue every 5 (five) minutes as needed for chest pain. 05/27/16   Thayer Headings, MD  potassium chloride SA (K-DUR,KLOR-CON) 20 MEQ tablet Take 2 tablets (40 mEq total) by mouth daily. 03/26/17   Domenic Polite, MD  tamsulosin (FLOMAX) 0.4 MG CAPS capsule Take 0.4 mg by mouth at bedtime. 03/01/17   Historical Provider, MD    Allergies:   Codeine and Zetia [ezetimibe]   Social History   Social History  . Marital status: Married    Spouse name: N/A  . Number of children: N/A  . Years of education: N/A   Social History Main Topics  .  Smoking status: Former Smoker    Types: Cigarettes    Quit date: 02/04/1987  . Smokeless tobacco: Former Systems developer    Types: [place] date: 07/20/2009     Comment: quit 35+yrs ago  . Alcohol use No  . Drug use: No  . Sexual activity: Not Currently   Other Topics Concern  . Not on file   Social History Narrative  . No narrative on file     Family History:  The patient's family history includes Diabetes in his son; Heart attack in his father and sister; Heart disease in his father and sister; Hypertension in his mother and sister. ***  ROS:   Please see the history of present illness.    ROS All other systems reviewed and are negative.   PHYSICAL EXAM:   VS:  There were no vitals taken for this visit.   GEN: Well nourished, well developed, in no acute distress  HEENT: normal  Neck: no JVD, carotid bruits, or masses Cardiac: ***RRR; no murmurs, rubs, or gallops,no edema  Respiratory:  clear to auscultation bilaterally, normal work of breathing GI: soft, nontender, nondistended, + BS MS: no deformity or atrophy  Skin: warm and dry, no rash Neuro:  Alert and Oriented x 3, Strength and sensation are intact Psych: euthymic mood, full affect  Wt  Readings from Last 3 Encounters:  03/26/17 170 lb 12.8 oz (77.5 kg)  03/10/17 172 lb 3.2 oz (78.1 kg)  03/01/17 167 lb 8 oz (76 kg)      Studies/Labs Reviewed:   EKG:  EKG is ordered today.  The ekg ordered today demonstrates ***  Recent Labs: 02/26/2017: B Natriuretic Peptide 535.5; Magnesium 2.0; TSH 11.473 02/28/2017: ALT 18 03/26/2017: BUN 16; Creatinine, Ser 1.41; Hemoglobin 11.4; Platelets 297; Potassium 3.5; Sodium 137   Lipid Panel    Component Value Date/Time   CHOL 207 (H) 01/20/2017 1051   TRIG 121 01/20/2017 1051   HDL 45 01/20/2017 1051   CHOLHDL 4.6 01/20/2017 1051   CHOLHDL 3.8 06/08/2016 0901   VLDL 15 06/08/2016 0901   LDLCALC 138 (H) 01/20/2017 1051    Additional studies/ records that were reviewed today include:   Echo 02/27/17 Study Conclusions  - Left ventricle: The cavity size was normal. Wall thickness was increased in a pattern of mild LVH. Systolic function was mildly reduced. The estimated ejection fraction was 45%. The study was not technically sufficient to allow evaluation of LV diastolic dysfunction due to atrial fibrillation. - Regional wall motion abnormality: Akinesis of the mid anteroseptal, mid inferoseptal, and basal inferior myocardium; severe hypokinesis of the mid inferior myocardium. - Aortic valve: Trileaflet; mildly thickened, mildly calcified leaflets. Morphologically, there is at least mild, if not moderate aortic valve stenosis. - Left atrium: The atrium was moderately to severely dilated. - Right ventricle: Systolic function was severely reduced.   Cardiac Catheterization:     ASSESSMENT & PLAN:    1. CADs/p CABG in 2016 - Recently elevated troponin trend  0.21-->.044-->.028. Felt demand ischemia in setting of sepsis with UTI/pyelonephritis with ? Mild sigmoid diverticulitis. - No recurrent symptoms ***  2. PAF - Diagnosed 02/2017  in setting of hypothyroidism (new diagnosis - TSH was 11).  Maintatning sinus rhythm on amiodarone. Check TSH ****  3. Chronic systolic CHF - EF was 4%% on echocardiogram 02/2017. Low EF is likely due to A. fib. Euvolemic  4. Carotid Artery Disease s/p left CEA  - Followed by Dr. Kellie Simmering   5.  Aortic stenosis -  Mild to moderate.  6. HLD - 06/08/2016: VLDL 15 01/20/2017: Cholesterol, Total 207; HDL 45; LDL Calculated 138; Triglycerides 121  - Intolerance to statin    ***  Medication Adjustments/Labs and Tests Ordered: Current medicines are reviewed at length with the patient today.  Concerns regarding medicines are outlined above.  Medication changes, Labs and Tests ordered today are listed in the Patient Instructions below. There are no Patient Instructions on file for this visit.   Jarrett Soho, [place]  04/07/2017 1:13 PM    [facility] [address], [place], [place]  [address] Phone: [phone]; Fax: [phone]

## 2017-04-12 NOTE — Progress Notes (Signed)
Cardiology Office Note    Date:  04/14/2017   ID:  Ryan, [name] 1941/05/19, MRN [id number]  PCP:  Marton Redwood, MD  Cardiologist:  Dr. Acie Fredrickson  Chief Complaint: Hospital follow up for elevated troponin  History of Present Illness:   Ryan [name] is a 76 y.o. male with hx of HTN, HLD, CAD s/p CABG, PE, DVT, carotid artery diease s/p L CEA, PAF , AS, chronic systolic CHF( EF of 40% on echo 3/18) presented for hospital follow ip.  Admitted 02/2017 for worsening dyspnea. He was found to have new onset afib. Remained on sinus rhythm on amiodarone. TSH was elevated (hypothyroidism) and has likely contributed to atrial fibrillation. He was also found to have EF of 45%, felt due to afib.   He was doing on cardiac stand point when last seen by Dr. Acie Fredrickson 03/10/17 for hospital follow up.   Admitted 4/3-4/6 for sepsis with UTI/pyelonephritis with ? Mild sigmoid diverticulitis. Treated with Abx. Repeat a renal ultrasound 4/5with residual hydronephrosis --> recommended outpatient follow up. Troponin trend was 0.21-->.044-->.028. Discussed with  Dr. Acie Fredrickson (did not saw the patient) who felt demand ischemia and advised outpatient follow up. Amiodarone changed to PM due to nausea with improved symptoms.   The patient is here for follow-up. He denies any exertional chest pain or shortness of breath. No orthopnea, PND, syncope, lower extremity edema, melena, blood in his stool or urine, dizziness. Complains of fatigue and tiredness after taking his levothyroxine.  He stopped for a few days last week with improvement of symptoms. [name] last Friday however forgot to mention. Restarted levothyroxine weekend at night with all other medication. No improvement of symptoms. He states that his PCP down labs which is normal (unable to review).  Past Medical History:  Diagnosis Date  . Arthritis   . CAD (coronary artery disease)   . Carotid artery occlusion   . Cataract    Bil eyes/worse in  left eye  . CHF (congestive heart failure) ([place])   . Chronic back pain   . DVT (deep venous thrombosis) ([place])   . Dysrhythmia   . Enlarged prostate    takes Rapaflo daily  . GERD (gastroesophageal reflux disease)    occasional  . History of colon polyps   . History of gout    has colchicine prn  . History of kidney stones   . Hyperlipidemia    takes Crestor daily  . Hypertension    takes Amlodipine daily  . Hypothyroidism   . Myocardial infarction ([place])   . Peripheral vascular disease ([place])   . Pulmonary emboli ([place]) 03/20/2015   elevated d-dimer, intermediate V/Q study, atypical chest pain and SOB. Start on Xarelto 20mg  BID for 3 month  . Renal insufficiency   . Shortness of breath dyspnea   . Urinary frequency   . Urinary urgency     Past Surgical History:  Procedure Laterality Date  . APPENDECTOMY    . BACK SURGERY     5 times  . big toe surgery    . CARDIAC CATHETERIZATION     2010    dr Acie Fredrickson  . cataract surgery     left eye  . CHOLECYSTECTOMY N/A 07/27/2016   Procedure: LAPAROSCOPIC CHOLECYSTECTOMY;  Surgeon: Mickeal Skinner, MD;  Location: [place];  Service: General;  Laterality: N/A;  . COLONOSCOPY    . CORONARY ARTERY BYPASS GRAFT N/A 04/05/2015   Procedure: CORONARY ARTERY BYPASS GRAFTING (CABG)X4 LIMA-LAD; SVG-DIAG1-DIAG2; SVG-PD;  Surgeon: [name], MD;  Location: [place] [facility];  Service: Open Heart Surgery;  Laterality: N/A;  . CYSTOSCOPY    . ENDARTERECTOMY Left 04/24/2016   Procedure: ENDARTERECTOMY LEFT CAROTID;  Surgeon: Mal Misty, MD;  Location: [place];  Service: Vascular;  Laterality: Left;  . EYE SURGERY    . FEMORAL ARTERY - POPLITEAL ARTERY BYPASS GRAFT    . JOINT REPLACEMENT     shoulder  . LEFT HEART CATHETERIZATION WITH CORONARY ANGIOGRAM N/A 04/03/2015   Procedure: LEFT HEART CATHETERIZATION WITH CORONARY ANGIOGRAM;  Surgeon: Troy Sine, MD;  [facility] CATH LAB;  Service: Cardiovascular;  Laterality: N/A;  . LUMBAR LAMINECTOMY   01/06/2013   Procedure: MICRODISCECTOMY LUMBAR LAMINECTOMY;  Surgeon: Marybelle Killings, MD;  Location: [place];  Service: Orthopedics;  Laterality: N/A;  L3-4 decompression  . LUMBAR LAMINECTOMY/DECOMPRESSION MICRODISCECTOMY  02/12/2012   Procedure: LUMBAR LAMINECTOMY/DECOMPRESSION MICRODISCECTOMY;  Surgeon: Floyce Stakes, MD;  Location: [place] NEUR[facility];  Service: Neurosurgery;  Laterality: N/A;  Lumbar four-five laminectomy  . PATCH ANGIOPLASTY Left 04/24/2016   Procedure: LEFT CAROTID ARTERY PATCH ANGIOPLASTY;  Surgeon: Mal Misty, MD;  Location: [place];  Service: Vascular;  Laterality: Left;  . STERIOD INJECTION Right 01/09/2014   Procedure: STEROID INJECTION;  Surgeon: Mcarthur Rossetti, MD;  Location: [place];  Service: Orthopedics;  Laterality: Right;  . TEE WITHOUT CARDIOVERSION N/A 04/05/2015   Procedure: TRANSESOPHAGEAL ECHOCARDIOGRAM (TEE);  Surgeon: [name], MD;  Location: [facility];  Service: Open Heart Surgery;  Laterality: N/A;  . TOTAL HIP ARTHROPLASTY Left 01/09/2014   DR Ninfa Linden  . TOTAL HIP ARTHROPLASTY Left 01/09/2014   Procedure: LEFT TOTAL HIP ARTHROPLASTY ANTERIOR APPROACH and Steroid Injection Right hip;  Surgeon: Mcarthur Rossetti, MD;  Location: [facility];  Service: Orthopedics;  Laterality: Left;    Current Medications: Prior to Admission medications   Medication Sig Start Date End Date Taking? Authorizing Provider  amiodarone (PACERONE) 200 MG tablet Take 1 tablet (200 mg total) by mouth at bedtime. Take this at night instead of morning 03/26/17   Domenic Polite, MD  apixaban (ELIQUIS) 5 MG TABS tablet Take 1 tablet (5 mg total) by mouth 2 (two) times daily. 03/01/17   Reyne Dumas, MD  carvedilol (COREG) 3.125 MG tablet Take 3.125 mg by mouth daily. 12/16/16   Historical Provider, MD  cephALEXin (KEFLEX) 250 MG capsule Take 1 capsule (250 mg total) by mouth 4 (four) times daily. For 5days 03/26/17   Domenic Polite, MD  docusate sodium (COLACE) 100 MG capsule Take 100  mg by mouth daily.     Historical Provider, MD  furosemide (LASIX) 40 MG tablet Take 1 tablet (40 mg total) by mouth daily. 03/01/17   Reyne Dumas, MD  HYDROcodone-acetaminophen (NORCO/VICODIN) 5-325 MG tablet Take 1 tablet by mouth every 6 (six) hours as needed for moderate pain. 07/28/16   Mickeal Skinner, MD  levothyroxine (SYNTHROID, LEVOTHROID) 25 MCG tablet Take 1 tablet (25 mcg total) by mouth daily before breakfast. 03/02/17   Reyne Dumas, MD  methocarbamol (ROBAXIN) 500 MG tablet Take 1 tablet (500 mg total) by mouth every 8 (eight) hours as needed for muscle spasms. 03/01/17   Reyne Dumas, MD  nitroGLYCERIN (NITROSTAT) 0.4 MG SL tablet Place 1 tablet (0.4 mg total) under the tongue every 5 (five) minutes as needed for chest pain. 05/27/16   Thayer Headings, MD  potassium chloride SA (K-DUR,KLOR-CON) 20 MEQ tablet Take 2 tablets (40 mEq total) by  mouth daily. 03/26/17   Domenic Polite, MD  tamsulosin (FLOMAX) 0.4 MG CAPS capsule Take 0.4 mg by mouth at bedtime. 03/01/17   Historical Provider, MD    Allergies:   Codeine and Zetia [ezetimibe]   Social History   Social History  . Marital status: Married    Spouse name: N/A  . Number of children: N/A  . Years of education: N/A   Social History Main Topics  . Smoking status: Former Smoker    Types: Cigarettes    Quit date: 02/04/1987  . Smokeless tobacco: Former Systems developer    Types: [place] date: 07/20/2009     Comment: quit 35+yrs ago  . Alcohol use No  . Drug use: No  . Sexual activity: Not Currently   Other Topics Concern  . None   Social History Narrative  . None     Family History:  The patient's family history includes Diabetes in his son; Heart attack in his father and sister; Heart disease in his father and sister; Hypertension in his mother and sister.   ROS:   Please see the history of present illness.    ROS All other systems reviewed and are negative.   PHYSICAL EXAM:   VS:  BP 130/68   Pulse 89   Ht 5\' 6"   (1.676 m)   Wt 165 lb (74.8 kg)   SpO2 98%   BMI 26.63 kg/m    GEN: Well nourished, well developed, in no acute distress  HEENT: normal  Neck: no JVD, carotid bruits, or masses Cardiac: RRR; no murmurs, rubs, or gallops,no edema  Respiratory:  clear to auscultation bilaterally, normal work of breathing GI: soft, nontender, nondistended, + BS MS: no deformity or atrophy  Skin: warm and dry, no rash Neuro:  Alert and Oriented x 3, Strength and sensation are intact Psych: euthymic mood, full affect  Wt Readings from Last 3 Encounters:  04/14/17 165 lb (74.8 kg)  03/26/17 170 lb 12.8 oz (77.5 kg)  03/10/17 172 lb 3.2 oz (78.1 kg)      Studies/Labs Reviewed:   EKG:  EKG is ordered today.  The ekg ordered today demonstrates NSR with PAcs  Recent Labs: 02/26/2017: B Natriuretic Peptide 535.5; Magnesium 2.0; TSH 11.473 02/28/2017: ALT 18 03/26/2017: BUN 16; Creatinine, Ser 1.41; Hemoglobin 11.4; Platelets 297; Potassium 3.5; Sodium 137   Lipid Panel    Component Value Date/Time   CHOL 207 (H) 01/20/2017 1051   TRIG 121 01/20/2017 1051   HDL 45 01/20/2017 1051   CHOLHDL 4.6 01/20/2017 1051   CHOLHDL 3.8 06/08/2016 0901   VLDL 15 06/08/2016 0901   LDLCALC 138 (H) 01/20/2017 1051    Additional studies/ records that were reviewed today include:   Echo 02/27/17 Study Conclusions  - Left ventricle: The cavity size was normal. Wall thickness was increased in a pattern of mild LVH. Systolic function was mildly reduced. The estimated ejection fraction was 45%. The study was not technically sufficient to allow evaluation of LV diastolic dysfunction due to atrial fibrillation. - Regional wall motion abnormality: Akinesis of the mid anteroseptal, mid inferoseptal, and basal inferior myocardium; severe hypokinesis of the mid inferior myocardium. - Aortic valve: Trileaflet; mildly thickened, mildly calcified leaflets. Morphologically, there is at least mild, if  not moderate aortic valve stenosis. - Left atrium: The atrium was moderately to severely dilated. - Right ventricle: Systolic function was severely reduced.   Cardiac Catheterization:     ASSESSMENT & PLAN:  1. CADs/p CABG in 2016 - Recently elevated troponin trend  0.21-->.044-->.028. Felt demand ischemia in setting of sepsis with UTI/pyelonephritis with ? Mild sigmoid diverticulitis. - No recurrent symptoms. He is walking approximately 1 mile without angina or dyspnea.  2. PAF - Diagnosed 02/2017  in setting of hypothyroidism (new diagnosis - TSH was 11). Maintatning sinus rhythm on amiodarone.   3. Chronic systolic CHF - EF was 4%% on echocardiogram 02/2017. Low EF is likely due to A. fib. Euvolemic. Continue daily Lasix 40 mg.  4. Carotid Artery Disease s/p left CEA  - Followed by Dr. Kellie Simmering   5. Aortic stenosis - Mild to moderate.  6. HLD - 06/08/2016: VLDL 15 01/20/2017: Cholesterol, Total 207; HDL 45; LDL Calculated 138; Triglycerides 121  - Intolerance to statin. He will think about lipid clinic referral.   7. Hypothyroidism - Level by PCP normal last Friday per patient. He takes levothyroxine at night due to fatigue and tiredness. Advised to take in morning and empty stomach. He will discuss further with PCP--> I will forward my note.    Discussed with pharmacist. No drug interaction. Recommended thyroid medication in morning.   Medication Adjustments/Labs and Tests Ordered: Current medicines are reviewed at length with the patient today.  Concerns regarding medicines are outlined above.  Medication changes, Labs and Tests ordered today are listed in the Patient Instructions below. Patient Instructions  Medication Instructions:  None Ordered   Labwork: None Ordered   Testing/Procedures: None Ordered   Follow-Up: Your physician recommends that you schedule a follow-up appointment in: 3 months with Dr. Acie Fredrickson  Any Other Special  Instructions Will Be Listed Below (If Applicable).     If you need a refill on your cardiac medications before your next appointment, please call your pharmacy.      Jarrett Soho, [place]  04/14/2017 10:02 AM    [facility] [address], [place], [place]  [address] Phone: [phone]; Fax: [phone]

## 2017-04-13 ENCOUNTER — Ambulatory Visit: Payer: Medicare Other | Admitting: Physician Assistant

## 2017-04-13 DIAGNOSIS — N281 Cyst of kidney, acquired: Secondary | ICD-10-CM | POA: Diagnosis not present

## 2017-04-13 DIAGNOSIS — N201 Calculus of ureter: Secondary | ICD-10-CM | POA: Diagnosis not present

## 2017-04-14 ENCOUNTER — Encounter (INDEPENDENT_AMBULATORY_CARE_PROVIDER_SITE_OTHER): Payer: Self-pay

## 2017-04-14 ENCOUNTER — Ambulatory Visit (INDEPENDENT_AMBULATORY_CARE_PROVIDER_SITE_OTHER): Payer: Medicare Other | Admitting: Physician Assistant

## 2017-04-14 ENCOUNTER — Encounter: Payer: Self-pay | Admitting: Physician Assistant

## 2017-04-14 VITALS — BP 130/68 | HR 89 | Ht 66.0 in | Wt 165.0 lb

## 2017-04-14 DIAGNOSIS — I480 Paroxysmal atrial fibrillation: Secondary | ICD-10-CM | POA: Diagnosis not present

## 2017-04-14 DIAGNOSIS — I739 Peripheral vascular disease, unspecified: Secondary | ICD-10-CM

## 2017-04-14 DIAGNOSIS — E7800 Pure hypercholesterolemia, unspecified: Secondary | ICD-10-CM | POA: Diagnosis not present

## 2017-04-14 DIAGNOSIS — I10 Essential (primary) hypertension: Secondary | ICD-10-CM | POA: Diagnosis not present

## 2017-04-14 DIAGNOSIS — I779 Disorder of arteries and arterioles, unspecified: Secondary | ICD-10-CM

## 2017-04-14 DIAGNOSIS — I70211 Atherosclerosis of native arteries of extremities with intermittent claudication, right leg: Secondary | ICD-10-CM

## 2017-04-14 DIAGNOSIS — I2510 Atherosclerotic heart disease of native coronary artery without angina pectoris: Secondary | ICD-10-CM | POA: Diagnosis not present

## 2017-04-14 NOTE — Patient Instructions (Signed)
Medication Instructions:  None Ordered   Labwork: None Ordered   Testing/Procedures: None Ordered   Follow-Up: Your physician recommends that you schedule a follow-up appointment in: 3 months with Dr. Acie Fredrickson  Any Other Special Instructions Will Be Listed Below (If Applicable).     If you need a refill on your cardiac medications before your next appointment, please call your pharmacy.

## 2017-04-24 DIAGNOSIS — N201 Calculus of ureter: Secondary | ICD-10-CM | POA: Diagnosis not present

## 2017-04-24 DIAGNOSIS — N12 Tubulo-interstitial nephritis, not specified as acute or chronic: Secondary | ICD-10-CM | POA: Diagnosis not present

## 2017-04-24 DIAGNOSIS — R42 Dizziness and giddiness: Secondary | ICD-10-CM | POA: Diagnosis not present

## 2017-04-24 DIAGNOSIS — A419 Sepsis, unspecified organism: Secondary | ICD-10-CM | POA: Diagnosis not present

## 2017-04-24 DIAGNOSIS — Z6827 Body mass index (BMI) 27.0-27.9, adult: Secondary | ICD-10-CM | POA: Diagnosis not present

## 2017-04-24 DIAGNOSIS — N179 Acute kidney failure, unspecified: Secondary | ICD-10-CM | POA: Diagnosis not present

## 2017-05-06 ENCOUNTER — Other Ambulatory Visit: Payer: Self-pay | Admitting: *Deleted

## 2017-05-06 MED ORDER — POTASSIUM CHLORIDE CRYS ER 20 MEQ PO TBCR: 40.0000 meq | EXTENDED_RELEASE_TABLET | Freq: Every day | ORAL | 3 refills | Status: DC

## 2017-05-06 MED ORDER — FUROSEMIDE 40 MG PO TABS: 40.0000 mg | ORAL_TABLET | Freq: Every day | ORAL | 3 refills | Status: DC

## 2017-05-06 MED ORDER — BISOPROLOL FUMARATE 5 MG PO TABS: 2.5000 mg | ORAL_TABLET | Freq: Every day | ORAL | 3 refills | Status: DC

## 2017-05-06 MED ORDER — EZETIMIBE 10 MG PO TABS: 10.0000 mg | ORAL_TABLET | Freq: Every day | ORAL | 3 refills | Status: DC

## 2017-05-06 NOTE — Telephone Encounter (Signed)
Patients wife called and requested refills on potassium and furosemide but Dr Acie Fredrickson has never filled them as the patient was started on them in the hospital. Wife also requested a refill on amlodipine 5 mg qd. This is not listed on patients current med list but wife stated that the patient was started on carvedilol in the hospital but he did not tolerate it so he put himself back on amlodipine. The amlodipine was originally prescribed by patients pcp but they requested that the patient contact Dr Acie Fredrickson for refills. Please advise. Thanks, [place]

## 2017-05-06 NOTE — Telephone Encounter (Signed)
Spoke with patient about medication refills. I advised patient that amlodipine cannot take the place of carvedilol. I asked about his complaints of dizziness and he states he has always been dizzy since starting the carvedilol.  He took metoprolol in the past and did not tolerate it either. I advised that per Dr. Acie Fredrickson, we will start bisoprolol 2.5 mg in the place of the carvedilol. He asked me about his cholesterol medicine and I advised that there is not one on his medication list. He has had multiple intolerances to cholesterol medications and does not want to pay $300 per month for repatha. I advised that with an LDL of >130, we need to find something he can tolerate. He asked if he should try to restart Zetia at 1/2 dose. I encouraged him to start at 1/2 tab and try to increase to whole tab. I advised him to call back with questions or concerns prior to next f/u visit. He verbalized understanding and agreement and thanked me for the call.

## 2017-05-11 ENCOUNTER — Other Ambulatory Visit: Payer: Self-pay | Admitting: *Deleted

## 2017-05-11 ENCOUNTER — Telehealth: Payer: Self-pay | Admitting: Cardiovascular Disease

## 2017-05-11 MED ORDER — APIXABAN 5 MG PO TABS: 5.0000 mg | ORAL_TABLET | Freq: Two times a day (BID) | ORAL | 11 refills | Status: DC

## 2017-05-11 NOTE — Telephone Encounter (Addendum)
Request for Eliquis 5mg  received; pt is 76 yrs old, wt-74kg, Crea-1.41 on 03/26/17 & last seen by Robbie Lis PA on 04/14/17; refill request sent to requested pharmacy.   Spoke with Anderson Malta at [facility] to confirm that refill has been received since confirmation was not received electronically & she stated it was received.

## 2017-05-11 NOTE — Telephone Encounter (Signed)
New Message     *STAT* If patient is at the pharmacy, call can be transferred to refill team.   1. Which medications need to be refilled? (please list name of each medication and dose if known)  apixaban (ELIQUIS) 5 MG TABS tablet Take 1 tablet (5 mg total) by mouth 2 (two) times daily.   potassium chloride SA (K-DUR,KLOR-CON) 20 MEQ tablet Take 2 tablets (40 mEq total) by mouth daily     2. Which pharmacy/location (including street and city if local pharmacy) is medication to be sent to? [facility] [address]   3. Do they need a 30 day or 90 day supply? [place]

## 2017-05-11 NOTE — Telephone Encounter (Signed)
Request for Eliquis 5mg ; pt is 76 yrs old, wt-74kg, Crea-1.41 on 03/26/17 & last seen by Robbie Lis PA on 04/14/17; will send in refill request to requested pharmacy.

## 2017-05-11 NOTE — Telephone Encounter (Signed)
Called pt and spoke with pt's wife [name], informing her that the pt has refills left at his pharmacy at [facility] and that I was sending the refill request for Eliquis to our coumadin clinic to get refill. Wife verbalized understanding.

## 2017-05-31 ENCOUNTER — Encounter: Payer: Self-pay | Admitting: Vascular Surgery

## 2017-06-08 ENCOUNTER — Telehealth: Payer: Self-pay | Admitting: Cardiovascular Disease

## 2017-06-08 NOTE — Telephone Encounter (Signed)
Pts wife is calling to inform Dr. Acie Fredrickson and RN that the pt tried taking Zetia again, and is still unable to tolerate this med appropriately.  Wife reports that the pt is have muscle aches and joint pain with this med.  Wife reports that the pt tried this med sometime ago, and had the same side effect, but he was willing to give this a second try, as recommended by Dr. Acie Fredrickson.  Wife reports that the pt stopped taking this med yesterday.  Wife would like for Dr. Acie Fredrickson to advise on a different regimen, if necessary.  Informed the pt that Dr. Acie Fredrickson and RN are both out of the office today, but I will route this message to them both, for further review and recommendation, and someone will follow-up accordingly thereafter. Wife verbalized understanding and agrees with this plan.

## 2017-06-08 NOTE — Telephone Encounter (Signed)
New message       Pt c/o medication issue:  1. Name of Medication:  zetia  2. How are you currently taking this medication (dosage and times per day)? 10mg  daily 3. Are you having a reaction (difficulty breathing--STAT)? no 4. What is your medication issue? Pt is having muscle/joint pain.  Is there something else he can take?

## 2017-06-09 NOTE — Telephone Encounter (Signed)
Ryan [name] has seen the pharmacist in the lipid clinic.  I would recommend Repatha  Im sure this has been suggested and Im not sure how it fell through ( perhaps cost) Otherwise, I do not have any suggestions.

## 2017-06-09 NOTE — Telephone Encounter (Signed)
Left message for patient to call back  

## 2017-06-09 NOTE — Telephone Encounter (Signed)
Spoke with patient who states he has bone and joint pain so bad that he can no longer tolerate the zetia. He states he has had the same type of problems with other cholesterol medications in the past and he is certain his pain is caused by the zetia. He was seen in our lipid clinic and advised to start one of the PCSK9i agents in February 2018. He states Repatha, which was approved, would cost him $400 per month and he cannot afford it. He asks what he should do about his high cholesterol. I advised that I am going to route message to our pharmacists for advice since Ryan [name]'s note from 02/10/17 states that if cost is prohibitive, patient might be a good candidate for clinical trials. I advised patient that our office will call him in the next few days with advice. I advised he may hold the zetia at this point. He verbalized understanding and agreement with plan and thanked me for the call.

## 2017-06-09 NOTE — Telephone Encounter (Signed)
New Message ° ° pt verbalized that she is returning call for rn °

## 2017-06-10 ENCOUNTER — Ambulatory Visit: Payer: Medicare Other | Admitting: Cardiovascular Disease

## 2017-06-10 NOTE — Telephone Encounter (Signed)
LMOM for pt.  

## 2017-06-11 ENCOUNTER — Ambulatory Visit (HOSPITAL_COMMUNITY): Payer: Medicare Other

## 2017-06-11 ENCOUNTER — Ambulatory Visit: Payer: Medicare Other | Admitting: Vascular Surgery

## 2017-06-14 NOTE — Telephone Encounter (Signed)
Spoke with pt - he states that he had joint pain in his hips that affected his walking, as well as pain in his wrists. He is already intolerant to rosuvastatin 5mg  3x per week, 10 mg, 20 mg, 40 mg daily (muscle aches), pravastatin 20 mg daily (constipation), atorvastatin 40 mg daily (unknown reaction), welchol 1250 mg BID (unknown reason for stopping). PCSK9i therapy was too expensive. Discussed CLEAR clinical trial with pt again and he is now willing to pursue clinical trial. Will route note to research nurses to screen pt for CLEAR trial.

## 2017-07-06 DIAGNOSIS — R7301 Impaired fasting glucose: Secondary | ICD-10-CM | POA: Diagnosis not present

## 2017-07-06 DIAGNOSIS — E038 Other specified hypothyroidism: Secondary | ICD-10-CM | POA: Diagnosis not present

## 2017-07-06 DIAGNOSIS — E784 Other hyperlipidemia: Secondary | ICD-10-CM | POA: Diagnosis not present

## 2017-07-06 DIAGNOSIS — I25810 Atherosclerosis of coronary artery bypass graft(s) without angina pectoris: Secondary | ICD-10-CM | POA: Diagnosis not present

## 2017-07-06 DIAGNOSIS — Z6828 Body mass index (BMI) 28.0-28.9, adult: Secondary | ICD-10-CM | POA: Diagnosis not present

## 2017-07-06 DIAGNOSIS — N183 Chronic kidney disease, stage 3 (moderate): Secondary | ICD-10-CM | POA: Diagnosis not present

## 2017-07-06 DIAGNOSIS — I480 Paroxysmal atrial fibrillation: Secondary | ICD-10-CM | POA: Diagnosis not present

## 2017-07-06 DIAGNOSIS — I5020 Unspecified systolic (congestive) heart failure: Secondary | ICD-10-CM | POA: Diagnosis not present

## 2017-07-06 DIAGNOSIS — Z7901 Long term (current) use of anticoagulants: Secondary | ICD-10-CM | POA: Diagnosis not present

## 2017-07-06 DIAGNOSIS — I10 Essential (primary) hypertension: Secondary | ICD-10-CM | POA: Diagnosis not present

## 2017-07-09 ENCOUNTER — Ambulatory Visit (INDEPENDENT_AMBULATORY_CARE_PROVIDER_SITE_OTHER): Payer: Medicare Other | Admitting: Vascular Surgery

## 2017-07-09 ENCOUNTER — Ambulatory Visit (HOSPITAL_COMMUNITY)
Admission: RE | Admit: 2017-07-09 | Discharge: 2017-07-09 | Disposition: A | Discharge status: Home / Self Care | Payer: Medicare Other | Source: Ambulatory Visit | Attending: Vascular Surgery | Admitting: Vascular Surgery

## 2017-07-09 ENCOUNTER — Encounter: Payer: Self-pay | Admitting: Vascular Surgery

## 2017-07-09 VITALS — BP 178/74 | HR 58 | Temp 97.4°F | Resp 16 | Ht 66.0 in | Wt 170.0 lb

## 2017-07-09 DIAGNOSIS — I6522 Occlusion and stenosis of left carotid artery: Secondary | ICD-10-CM

## 2017-07-09 DIAGNOSIS — I739 Peripheral vascular disease, unspecified: Secondary | ICD-10-CM | POA: Diagnosis not present

## 2017-07-09 LAB — VAS US CAROTID
LCCADSYS: -136 cm/s
LEFT ECA DIAS: 9 cm/s
LEFT VERTEBRAL DIAS: 14 cm/s
LICADSYS: -105 cm/s
LICAPDIAS: -17 cm/s
Left CCA dist dias: -18 cm/s
Left CCA prox dias: 25 cm/s
Left CCA prox sys: 141 cm/s
Left ICA dist dias: -27 cm/s
Left ICA prox sys: -91 cm/s
RCCAPDIAS: 10 cm/s
RCCAPSYS: 75 cm/s
RIGHT CCA MID DIAS: 15 cm/s
RIGHT ECA DIAS: -14 cm/s
RIGHT VERTEBRAL DIAS: -6 cm/s
Right cca dist sys: -96 cm/s

## 2017-07-09 NOTE — Progress Notes (Signed)
Vitals:   07/09/17 0848 07/09/17 0851  BP: (!) 151/71 (!) 163/67  Pulse: (!) 58 (!) 58  Resp: 16   Temp: (!) 97.4 F (36.3 C)   SpO2: 99%   Weight: 170 lb (77.1 kg)   Height: 5\' 6"  (1.676 m)

## 2017-07-09 NOTE — Progress Notes (Signed)
Patient ID: Ryan [name], male   DOB: Dec 01, 1941, 76 y.o.   MRN: [id number]  Reason for Consult: Carotid (f/u)   Referred by Marton Redwood, MD  Subjective:     HPI:  Ryan [name] is a 76 y.o. male follows up from his carotid endarterectomy performed last year. He still does have some residual numbness of his left neck although this is improving. He is then placed on anticoagulation for atrial fibrillation and is no longer taking aspirin. He does continue to take a statin drug. Chief complaint today is bilateral hip pain with walking at about 1 block. As of this doesn't resolve after resting he is concerned that it is vascular in nature given that he has had hip replacement and did not help. He does not want anything done for this. He does not have rest pain or tissue loss in his bilateral lower extremity.  Past Medical History:  Diagnosis Date  . Arthritis   . CAD (coronary artery disease)   . Carotid artery occlusion   . Cataract    Bil eyes/worse in left eye  . CHF (congestive heart failure) ([place])   . Chronic back pain   . DVT (deep venous thrombosis) ([place])   . Dysrhythmia   . Enlarged prostate    takes Rapaflo daily  . GERD (gastroesophageal reflux disease)    occasional  . History of colon polyps   . History of gout    has colchicine prn  . History of kidney stones   . Hyperlipidemia    takes Crestor daily  . Hypertension    takes Amlodipine daily  . Hypothyroidism   . Myocardial infarction ([place])   . Peripheral vascular disease ([place])   . Pulmonary emboli ([place]) 03/20/2015   elevated d-dimer, intermediate V/Q study, atypical chest pain and SOB. Start on Xarelto 20mg  BID for 3 month  . Renal insufficiency   . Shortness of breath dyspnea   . Urinary frequency   . Urinary urgency    Family History  Problem Relation Age of Onset  . Heart disease Father   . Heart attack Father   . Heart disease Sister   . Hypertension Sister   . Heart attack Sister   .  Hypertension Mother   . Diabetes Son    Past Surgical History:  Procedure Laterality Date  . APPENDECTOMY    . BACK SURGERY     5 times  . big toe surgery    . CARDIAC CATHETERIZATION     2010    dr Acie Fredrickson  . cataract surgery     left eye  . CHOLECYSTECTOMY N/A 07/27/2016   Procedure: LAPAROSCOPIC CHOLECYSTECTOMY;  Surgeon: Mickeal Skinner, MD;  Location: [facility];  Service: General;  Laterality: N/A;  . COLONOSCOPY    . CORONARY ARTERY BYPASS GRAFT N/A 04/05/2015   Procedure: CORONARY ARTERY BYPASS GRAFTING (CABG)X4 LIMA-LAD; SVG-DIAG1-DIAG2; SVG-PD;  Surgeon: [name], MD;  Location: [facility];  Service: Open Heart Surgery;  Laterality: N/A;  . CYSTOSCOPY    . ENDARTERECTOMY Left 04/24/2016   Procedure: ENDARTERECTOMY LEFT CAROTID;  Surgeon: Mal Misty, MD;  Location: [facility];  Service: Vascular;  Laterality: Left;  . EYE SURGERY    . FEMORAL ARTERY - POPLITEAL ARTERY BYPASS GRAFT    . JOINT REPLACEMENT     shoulder  . LEFT HEART CATHETERIZATION WITH CORONARY ANGIOGRAM N/A 04/03/2015   Procedure: LEFT HEART CATHETERIZATION WITH CORONARY ANGIOGRAM;  Surgeon: Troy Sine, MD;  Location: [facility] CATH [facility];  Service: Cardiovascular;  Laterality: N/A;  . LUMBAR LAMINECTOMY  01/06/2013   Procedure: MICRODISCECTOMY LUMBAR LAMINECTOMY;  Surgeon: Marybelle Killings, MD;  Location: [facility];  Service: Orthopedics;  Laterality: N/A;  L3-4 decompression  . LUMBAR LAMINECTOMY/DECOMPRESSION MICRODISCECTOMY  02/12/2012   Procedure: LUMBAR LAMINECTOMY/DECOMPRESSION MICRODISCECTOMY;  Surgeon: Floyce Stakes, MD;  Location: [facility];  Service: Neurosurgery;  Laterality: N/A;  Lumbar four-five laminectomy  . PATCH ANGIOPLASTY Left 04/24/2016   Procedure: LEFT CAROTID ARTERY PATCH ANGIOPLASTY;  Surgeon: Mal Misty, MD;  Location: [facility];  Service: Vascular;  Laterality: Left;  . STERIOD INJECTION Right 01/09/2014   Procedure: STEROID INJECTION;  Surgeon: Mcarthur Rossetti, MD;  Location: [facility];   Service: Orthopedics;  Laterality: Right;  . TEE WITHOUT CARDIOVERSION N/A 04/05/2015   Procedure: TRANSESOPHAGEAL ECHOCARDIOGRAM (TEE);  Surgeon: [name], MD;  Location: [facility];  Service: Open Heart Surgery;  Laterality: N/A;  . TOTAL HIP ARTHROPLASTY Left 01/09/2014   DR Ninfa Linden  . TOTAL HIP ARTHROPLASTY Left 01/09/2014   Procedure: LEFT TOTAL HIP ARTHROPLASTY ANTERIOR APPROACH and Steroid Injection Right hip;  Surgeon: Mcarthur Rossetti, MD;  Location: [facility];  Service: Orthopedics;  Laterality: Left;    Short Social History:  Social History  Substance Use Topics  . Smoking status: Former Smoker    Types: Cigarettes    Quit date: 02/04/1987  . Smokeless tobacco: Former Systems developer    Types: [place] date: 07/20/2009     Comment: quit 35+yrs ago  . Alcohol use No    Allergies  Allergen Reactions  . Codeine Nausea And Vomiting       . Zetia [Ezetimibe] Other (See Comments)    Myalgias     Current Outpatient Prescriptions  Medication Sig Dispense Refill  . amiodarone (PACERONE) 200 MG tablet Take 1 tablet (200 mg total) by mouth at bedtime. Take this at night instead of morning    . apixaban (ELIQUIS) 5 MG TABS tablet Take 1 tablet (5 mg total) by mouth 2 (two) times daily. 60 tablet 11  . bisoprolol (ZEBETA) 5 MG tablet Take 0.5 tablets (2.5 mg total) by mouth daily. 45 tablet 3  . docusate sodium (COLACE) 100 MG capsule Take 100 mg by mouth daily.     Marland Kitchen ezetimibe (ZETIA) 10 MG tablet     . furosemide (LASIX) 40 MG tablet Take 1 tablet (40 mg total) by mouth daily. 90 tablet 3  . levothyroxine (SYNTHROID, LEVOTHROID) 25 MCG tablet Take 1 tablet (25 mcg total) by mouth daily before breakfast. 30 tablet 1  . methocarbamol (ROBAXIN) 500 MG tablet Take 1 tablet (500 mg total) by mouth every 8 (eight) hours as needed for muscle spasms. 30 tablet 0  . nitroGLYCERIN (NITROSTAT) 0.4 MG SL tablet Place 1 tablet (0.4 mg total) under the tongue every 5 (five) minutes as needed  for chest pain. 25 tablet 6  . pantoprazole (PROTONIX) 40 MG tablet     . potassium chloride SA (K-DUR,KLOR-CON) 20 MEQ tablet Take 2 tablets (40 mEq total) by mouth daily. 180 tablet 3  . rosuvastatin (CRESTOR) 10 MG tablet     . tamsulosin (FLOMAX) 0.4 MG CAPS capsule Take 0.4 mg by mouth at bedtime.    Marland Kitchen HYDROcodone-acetaminophen (NORCO/VICODIN) 5-325 MG tablet Take 1 tablet by mouth every 6 (six) hours as needed for moderate pain. (Patient not taking: Reported on 07/09/2017) 20 tablet 0  . SYNTHROID 50 MCG tablet  No current facility-administered medications for this visit.     Review of Systems  Constitutional:  Constitutional negative. Eyes: Eyes negative.  Respiratory: Positive for shortness of breath.  Cardiovascular: Positive for claudication and irregular heartbeat.  GI: Gastrointestinal negative.  Musculoskeletal: Positive for gait problem, leg pain and joint pain.  Skin: Skin negative.  Neurological: Positive for dizziness.  Hematologic:       History of blood clot Psychiatric: Psychiatric negative.        Objective:  Objective   Vitals:   07/09/17 0848 07/09/17 0851 07/09/17 0853  BP: (!) 151/71 (!) 163/67 (!) 178/74  Pulse: (!) 58 (!) 58 (!) 58  Resp: 16    Temp: (!) 97.4 F (36.3 C)    SpO2: 99%    Weight: 170 lb (77.1 kg)    Height: 5\' 6"  (1.676 m)     Body mass index is 27.44 kg/m.  Physical Exam  Constitutional: He is oriented to person, place, and time. He appears well-developed.  HENT:  Head: Normocephalic.  Eyes: Pupils are equal, round, and reactive to light.  Neck: Normal range of motion.  Cardiovascular: Normal rate.   Palpable femoral pulses  Pulmonary/Chest: Effort normal.  Abdominal: Soft. He exhibits no mass.  Musculoskeletal: Normal range of motion. He exhibits no edema.  Neurological: He is alert and oriented to person, place, and time.  Skin: Skin is warm and dry.  Psychiatric: He has a normal mood and affect. His behavior is  normal. Judgment and thought content normal.    Data: I'm independently interpreted his bilateral carotid endarterectomies which demonstrated 1-39% right as well as left with patent carotid endarterectomy site with no restenosis.     Assessment/Plan:     76 year old male follows up from carotid endarterectomy performed one year ago with patent endarterectomy site and no symptoms. He does have apparent claudication in his bilateral hips with walking and a history of aortobifemoral bypass. He will follow-up in one year with repeat carotid duplexes as well as ABIs. I have offered him CT scan to evaluate his aortobifemoral bypass as well as hypogastric flow but he is uninterested in any further procedures at this time. He will continue his statin and blood thinning medications we will see him in one year should he not have issues sooner.     Waynetta Sandy MD [facility]

## 2017-07-23 ENCOUNTER — Ambulatory Visit: Payer: Medicare Other | Admitting: Vascular Surgery

## 2017-07-23 ENCOUNTER — Encounter (HOSPITAL_COMMUNITY): Payer: Medicare Other

## 2017-07-23 NOTE — Addendum Note (Signed)
Addended by: Lianne Cure A on: 07/23/2017 09:18 AM   Modules accepted: Orders

## 2017-08-03 ENCOUNTER — Ambulatory Visit (INDEPENDENT_AMBULATORY_CARE_PROVIDER_SITE_OTHER): Payer: Medicare Other | Admitting: Cardiovascular Disease

## 2017-08-03 ENCOUNTER — Encounter: Payer: Self-pay | Admitting: Cardiovascular Disease

## 2017-08-03 VITALS — BP 118/60 | HR 54 | Ht 66.0 in | Wt 172.0 lb

## 2017-08-03 DIAGNOSIS — I2510 Atherosclerotic heart disease of native coronary artery without angina pectoris: Secondary | ICD-10-CM | POA: Diagnosis not present

## 2017-08-03 DIAGNOSIS — I4891 Unspecified atrial fibrillation: Secondary | ICD-10-CM | POA: Diagnosis not present

## 2017-08-03 DIAGNOSIS — I70211 Atherosclerosis of native arteries of extremities with intermittent claudication, right leg: Secondary | ICD-10-CM | POA: Diagnosis not present

## 2017-08-03 NOTE — Patient Instructions (Signed)
Medication Instructions:  Your physician recommends that you continue on your current medications as directed. Please refer to the Current Medication list given to you today.   Labwork: None Ordered   Testing/Procedures: None Ordered   Follow-Up: Your physician wants you to follow-up in: 6 months with Dr. [name].  You will receive a reminder letter in the mail two months in advance. If you don't receive a letter, please call our office to schedule the follow-up appointment.   If you need a refill on your cardiac medications before your next appointment, please call your pharmacy.   Thank you for choosing [facility]! Durelle Zepeda, RN [phone]    

## 2017-08-03 NOTE — Progress Notes (Signed)
Problem List  1. CAD - CABG  2. Essential Hypertension 3. Hyperlipidemia 4. Left carotid arterectomy  5. Atrial fib  6. Gout    Ryan [name] is a 76 y.o. man  With a history of hyperlipidemia, coronary artery disease, hypothyroidism  He was admitted March 31 with CP and dyspnea.   VQ was intermediate.   Was clinically thought to have a PE  So he was DC'd on NOAC. Was readmitted several days later with STEMI and cath revealed severe CAD. CT angio on April 16 showed no evidence of pulmonary embolus.   Oct. 4, 2016:  Ryan [name] is doing well.  Occasional episodes of orthostatic hypotension Is eating some salty snacks. Has lots of hip pain with walking  Encouraged him to try a stationary bike or elliptical  Had peripheral arterial duplex scan ( ? At [facility]) that was reportedly normal.   March 17, 2016  Thought he had some symptoms of food poisoning - occurred after eating .  Also may have developed back issues related to taking the crestor also  He stopped the crestor and the symptoms have resolved.   June 24, 2016:  Ryan [name] is seen today .   Spent the weekend at [facility]. - has a house there.  No CP or dyspnea.      March 10, 2017:  Ryan [name] is seen back after a recent hospitalization for new atrial fib .   Was started on amio and Eliquis  The echo shows an EF of 45%.  Moderate AS   Gets nausea after taking one of his meds.  Is now on Amio 400 BID,  Was also found to have gout  - was started on colchicine - feels better.  Is having some diarrhea from the colchicine   Is back in NSR   Aug. 14, 2018:  Ryan [name] is seen today for follow up of his CAD and atrial fib. He was hospitalized in April with urinary tract infection/pyelonephritis. He had mildly elevated troponin levels at that time. He also had acute kidney injury with a creatinine of 2.31 on admission. Creatinine has improved to 1.41  Had mildly elevated troponin levels at that time I had talked to Dr. Broadus John  about doing a [place] Call .   Still gets fatigued frequently . Has claudication .  Used to see Dr. Kellie Simmering.     Medications Current Outpatient Prescriptions  Medication Sig Dispense Refill  . amiodarone (PACERONE) 200 MG tablet Take 1 tablet (200 mg total) by mouth at bedtime. Take this at night instead of morning    . apixaban (ELIQUIS) 5 MG TABS tablet Take 1 tablet (5 mg total) by mouth 2 (two) times daily. 60 tablet 11  . bisoprolol (ZEBETA) 5 MG tablet Take 0.5 tablets (2.5 mg total) by mouth daily. 45 tablet 3  . furosemide (LASIX) 40 MG tablet Take 1 tablet (40 mg total) by mouth daily. 90 tablet 3  . levothyroxine (SYNTHROID, LEVOTHROID) 25 MCG tablet Take 1 tablet (25 mcg total) by mouth daily before breakfast. 30 tablet 1  . nitroGLYCERIN (NITROSTAT) 0.4 MG SL tablet Place 1 tablet (0.4 mg total) under the tongue every 5 (five) minutes as needed for chest pain. 25 tablet 6  . potassium chloride SA (K-DUR,KLOR-CON) 20 MEQ tablet Take 2 tablets (40 mEq total) by mouth daily. 180 tablet 3  . rosuvastatin (CRESTOR) 10 MG tablet     . tamsulosin (FLOMAX) 0.4 MG CAPS capsule Take 0.4 mg by mouth at bedtime.  No current facility-administered medications for this visit.     PE: BP 118/60   Pulse (!) 54   Ht 5\' 6"  (1.676 m)   Wt 172 lb (78 kg)   SpO2 98%   BMI 27.76 kg/m  HEENT:   No JVD, soft left carotid bruit,  s/p L CEA scar  Lungs: clear  Cor:  Regular rate.  Soft systolic murmur  Abd:  + BS ,  Ext , no clubbing cyanosis , no edema,  Skin , normal Psych:  Normal ,   Neuro: CN intact, walks without trouble   ECG : Ordered by by :     Lab Results:  No results for input(s): WBC, HGB, HCT, PLT in the last 72 hours. BMET No results for input(s): NA, K, CL, CO2, GLUCOSE, BUN, CREATININE, CALCIUM in the last 72 hours.  Assessment/Plan    1. Paroxysmal Atrial fib:   Presented with AF to the hospital   .   Converted on [place] for now   2. CAD:  severe multivessel disease. CABG  3 years ago  Discussed doig a myoview to follow up with his mildly elevated Troponin levels in April , 2018 ( in the setting of urosepsis)  He does not want to do a myoview at this time.     3. Hyperlipidemia - LDL = 97 on Crestor 5 mg M,W,F and Zetia 10 mg a day   Is having some nausea withone of his meds. He will hold several of his meds one at a time .    4.  Hypothyroidism:  Managed by his primary MD   5..  Essential hypertension-  Blood pressure is well controlled at this point .    6.  Gout :  Much better    7. Nausea:    Persists.    Thayer Headings, Brooke Bonito., MD, [facility] 08/03/2017, 9:36 AM [address]  [place] Pager [phone]

## 2017-09-14 ENCOUNTER — Other Ambulatory Visit: Payer: Self-pay | Admitting: Cardiovascular Disease

## 2017-10-17 DIAGNOSIS — Z23 Encounter for immunization: Secondary | ICD-10-CM | POA: Diagnosis not present

## 2017-11-15 ENCOUNTER — Telehealth: Payer: Self-pay | Admitting: Cardiovascular Disease

## 2017-11-15 NOTE — Telephone Encounter (Signed)
Spoke with patient and advised that I can leave 2 boxes of eliquis 5 mg samples at the front desk for him to pick up. I advised him of the cost of comparable medications and he states he does not want to take coumadin. I advised that after these samples he will need to pay for enough medication to get him to the first of the new year and then cost should be affordable again. He verbalized understanding and thanked me for my help.

## 2017-11-15 NOTE — Telephone Encounter (Signed)
New Message     Pt c/o medication issue:  1. Name of Medication:  eliquis 5mg   2. How are you currently taking this medication (dosage and times per day)? 2x time a day   3. Are you having a reaction (difficulty breathing--STAT)? no  4. What is your medication issue?  In donut hole can not afford the $157 to get it filled

## 2017-12-15 ENCOUNTER — Telehealth: Payer: Self-pay | Admitting: Cardiovascular Disease

## 2017-12-15 NOTE — Telephone Encounter (Signed)
error 

## 2018-01-14 DIAGNOSIS — R82998 Other abnormal findings in urine: Secondary | ICD-10-CM | POA: Diagnosis not present

## 2018-01-14 DIAGNOSIS — M1000 Idiopathic gout, unspecified site: Secondary | ICD-10-CM | POA: Diagnosis not present

## 2018-01-14 DIAGNOSIS — I10 Essential (primary) hypertension: Secondary | ICD-10-CM | POA: Diagnosis not present

## 2018-01-14 DIAGNOSIS — R7301 Impaired fasting glucose: Secondary | ICD-10-CM | POA: Diagnosis not present

## 2018-01-14 DIAGNOSIS — E7849 Other hyperlipidemia: Secondary | ICD-10-CM | POA: Diagnosis not present

## 2018-01-14 DIAGNOSIS — E038 Other specified hypothyroidism: Secondary | ICD-10-CM | POA: Diagnosis not present

## 2018-01-14 DIAGNOSIS — Z125 Encounter for screening for malignant neoplasm of prostate: Secondary | ICD-10-CM | POA: Diagnosis not present

## 2018-01-21 DIAGNOSIS — R7301 Impaired fasting glucose: Secondary | ICD-10-CM | POA: Diagnosis not present

## 2018-01-21 DIAGNOSIS — I5020 Unspecified systolic (congestive) heart failure: Secondary | ICD-10-CM | POA: Diagnosis not present

## 2018-01-21 DIAGNOSIS — Z0000 Encounter for general adult medical examination without abnormal findings: Secondary | ICD-10-CM | POA: Diagnosis not present

## 2018-01-21 DIAGNOSIS — E7849 Other hyperlipidemia: Secondary | ICD-10-CM | POA: Diagnosis not present

## 2018-01-21 DIAGNOSIS — Z7901 Long term (current) use of anticoagulants: Secondary | ICD-10-CM | POA: Diagnosis not present

## 2018-01-21 DIAGNOSIS — I480 Paroxysmal atrial fibrillation: Secondary | ICD-10-CM | POA: Diagnosis not present

## 2018-01-21 DIAGNOSIS — Z6829 Body mass index (BMI) 29.0-29.9, adult: Secondary | ICD-10-CM | POA: Diagnosis not present

## 2018-01-21 DIAGNOSIS — Z1389 Encounter for screening for other disorder: Secondary | ICD-10-CM | POA: Diagnosis not present

## 2018-01-21 DIAGNOSIS — M25551 Pain in right hip: Secondary | ICD-10-CM | POA: Diagnosis not present

## 2018-01-21 DIAGNOSIS — R972 Elevated prostate specific antigen [PSA]: Secondary | ICD-10-CM | POA: Diagnosis not present

## 2018-01-21 DIAGNOSIS — I10 Essential (primary) hypertension: Secondary | ICD-10-CM | POA: Diagnosis not present

## 2018-01-21 DIAGNOSIS — I25810 Atherosclerosis of coronary artery bypass graft(s) without angina pectoris: Secondary | ICD-10-CM | POA: Diagnosis not present

## 2018-01-24 DIAGNOSIS — Z1212 Encounter for screening for malignant neoplasm of rectum: Secondary | ICD-10-CM | POA: Diagnosis not present

## 2018-01-27 ENCOUNTER — Telehealth: Payer: Self-pay | Admitting: *Deleted

## 2018-01-27 NOTE — Telephone Encounter (Signed)
Ryan [name] wife called and said they would be speaking with a physician about the Clear Research study and if he was interested that he would call us back. He was scheduled for a screening visit on 01/26/18.

## 2018-02-01 ENCOUNTER — Encounter (INDEPENDENT_AMBULATORY_CARE_PROVIDER_SITE_OTHER): Payer: Self-pay | Admitting: Orthopaedic Surgery

## 2018-02-01 ENCOUNTER — Ambulatory Visit (INDEPENDENT_AMBULATORY_CARE_PROVIDER_SITE_OTHER): Payer: Medicare Other

## 2018-02-01 ENCOUNTER — Ambulatory Visit (INDEPENDENT_AMBULATORY_CARE_PROVIDER_SITE_OTHER): Payer: Medicare Other | Admitting: Orthopaedic Surgery

## 2018-02-01 DIAGNOSIS — M25552 Pain in left hip: Secondary | ICD-10-CM | POA: Diagnosis not present

## 2018-02-01 DIAGNOSIS — M25551 Pain in right hip: Secondary | ICD-10-CM

## 2018-02-01 DIAGNOSIS — M7061 Trochanteric bursitis, right hip: Secondary | ICD-10-CM | POA: Diagnosis not present

## 2018-02-01 DIAGNOSIS — M7062 Trochanteric bursitis, left hip: Secondary | ICD-10-CM | POA: Diagnosis not present

## 2018-02-01 DIAGNOSIS — K921 Melena: Secondary | ICD-10-CM | POA: Diagnosis not present

## 2018-02-01 NOTE — Progress Notes (Signed)
Office Visit Note   Patient: Ryan [name]           Date of Birth: 1941-06-28           MRN: [id number] Visit Date: 02/01/2018              Requested by: Marton Redwood, MD [address] [place], [place] [address] PCP: Marton Redwood, MD   Assessment & Plan: Visit Diagnoses:  1. Bilateral hip pain   2. Trochanteric bursitis of both hips     Plan: Will send him to PT for IT band stretching , modalities and home exercise program. Follow up in 6 weeks for re-evaluation.   Follow-Up Instructions: Return in about 6 weeks (around 03/15/2018).   Orders:  Orders Placed This Encounter  Procedures  . XR HIPS BILAT W OR W/O PELVIS 2V   No orders of the defined types were placed in this encounter.     Procedures: No procedures performed   Clinical Data: No additional findings.   Subjective: Chief Complaint  Patient presents with  . Right Hip - Pain  . Left Hip - Pain    HPI  Review of Systems   Objective: Vital Signs: There were no vitals taken for this visit.  Physical Exam  Constitutional: He is oriented to person, place, and time. He appears well-developed and well-nourished. No distress.  Pulmonary/Chest: Effort normal.  Neurological: He is alert and oriented to person, place, and time.  Skin: He is not diaphoretic.  Psychiatric: He has a normal mood and affect.    Ortho Exam Tenderness over trochanteric region bilateral hips. Good range of motion bilateral hips without pain   Specialty Comments:  No specialty comments available.  Imaging: Xr Hips Bilat W Or W/o Pelvis 2v  Result Date: 02/01/2018 AP pelvis and lateral bilateral hips: No acute fractures. Bilateral hips well located. Status post left total hip arthroplasty without any complicating features. Slight impingement right hip. Right hip otherwise well maintained.     PMFS History: Patient Active Problem List   Diagnosis Date Noted  . Elevated troponin 03/23/2017  . Sigmoid  diverticulitis 03/23/2017  . Acute pyelonephritis 03/23/2017  . AKI (acute kidney injury) ([place]) 03/23/2017  . Wrist pain, acute, right   . Acute systolic heart failure ([place])   . Hypothyroidism   . Rapid atrial fibrillation ([place])   . Dyspnea 02/26/2017  . Shoulder blade pain 02/26/2017  . SOB (shortness of breath) 02/26/2017  . Chronic right shoulder pain   . Chronic systolic CHF (congestive heart failure) ([place])   . HLD (hyperlipidemia) 01/20/2017  . Chronic cholecystitis 07/27/2016  . PAD (peripheral artery disease) ([place]) 07/09/2015  . S/P CABG x 4 04/05/2015  . Coronary artery disease involving native coronary artery of native heart without angina pectoris   . Atypical chest pain 03/20/2015  . Carotid artery disease ([place]) 10/09/2014  . PVD (peripheral vascular disease) ([place]) 10/09/2014  . HTN (hypertension) 05/30/2014  . Arthritis of left hip 01/09/2014  . Status post THR (total hip replacement) 01/09/2014  . Spinal stenosis, lumbar 01/06/2013    Class: Diagnosis of  . Atherosclerosis of native artery of extremity with intermittent claudication ([place]) 10/18/2012   Past Medical History:  Diagnosis Date  . Arthritis   . CAD (coronary artery disease)   . Carotid artery occlusion   . Cataract    Bil eyes/worse in left eye  . CHF (congestive heart failure) ([place])   . Chronic back pain   . DVT (  deep venous thrombosis) ([place])   . Dysrhythmia   . Enlarged prostate    takes Rapaflo daily  . GERD (gastroesophageal reflux disease)    occasional  . History of colon polyps   . History of gout    has colchicine prn  . History of kidney stones   . Hyperlipidemia    takes Crestor daily  . Hypertension    takes Amlodipine daily  . Hypothyroidism   . Myocardial infarction ([place])   . Peripheral vascular disease ([place])   . Pulmonary emboli ([place]) 03/20/2015   elevated d-dimer, intermediate V/Q study, atypical chest pain and SOB. Start on Xarelto 20mg  BID for 3 month  . Renal  insufficiency   . Shortness of breath dyspnea   . Urinary frequency   . Urinary urgency     Family History  Problem Relation Age of Onset  . Heart disease Father   . Heart attack Father   . Heart disease Sister   . Hypertension Sister   . Heart attack Sister   . Hypertension Mother   . Diabetes Son     Past Surgical History:  Procedure Laterality Date  . APPENDECTOMY    . BACK SURGERY     5 times  . big toe surgery    . CARDIAC CATHETERIZATION     2010    dr Acie Fredrickson  . cataract surgery     left eye  . CHOLECYSTECTOMY N/A 07/27/2016   Procedure: LAPAROSCOPIC CHOLECYSTECTOMY;  Surgeon: Mickeal Skinner, MD;  Location: [facility];  Service: General;  Laterality: N/A;  . COLONOSCOPY    . CORONARY ARTERY BYPASS GRAFT N/A 04/05/2015   Procedure: CORONARY ARTERY BYPASS GRAFTING (CABG)X4 LIMA-LAD; SVG-DIAG1-DIAG2; SVG-PD;  Surgeon: [name], MD;  Location: [facility];  Service: Open Heart Surgery;  Laterality: N/A;  . CYSTOSCOPY    . ENDARTERECTOMY Left 04/24/2016   Procedure: ENDARTERECTOMY LEFT CAROTID;  Surgeon: Mal Misty, MD;  Location: [facility];  Service: Vascular;  Laterality: Left;  . EYE SURGERY    . FEMORAL ARTERY - POPLITEAL ARTERY BYPASS GRAFT    . JOINT REPLACEMENT     shoulder  . LEFT HEART CATHETERIZATION WITH CORONARY ANGIOGRAM N/A 04/03/2015   Procedure: LEFT HEART CATHETERIZATION WITH CORONARY ANGIOGRAM;  Surgeon: Troy Sine, MD;  Location: [facility]ATH LAB;  Service: Cardiovascular;  Laterality: N/A;  . LUMBAR LAMINECTOMY  01/06/2013   Procedure: MICRODISCECTOMY LUMBAR LAMINECTOMY;  Surgeon: Marybelle Killings, MD;  Location: [facility];  Service: Orthopedics;  Laterality: N/A;  L3-4 decompression  . LUMBAR LAMINECTOMY/DECOMPRESSION MICRODISCECTOMY  02/12/2012   Procedure: LUMBAR LAMINECTOMY/DECOMPRESSION MICRODISCECTOMY;  Surgeon: Floyce Stakes, MD;  Location: [facility];  Service: Neurosurgery;  Laterality: N/A;  Lumbar four-five laminectomy  . PATCH ANGIOPLASTY Left  04/24/2016   Procedure: LEFT CAROTID ARTERY PATCH ANGIOPLASTY;  Surgeon: Mal Misty, MD;  Location: [place];  Service: Vascular;  Laterality: Left;  . STERIOD INJECTION Right 01/09/2014   Procedure: STEROID INJECTION;  Surgeon: Mcarthur Rossetti, MD;  Location: [facility];  Service: Orthopedics;  Laterality: Right;  . TEE WITHOUT CARDIOVERSION N/A 04/05/2015   Procedure: TRANSESOPHAGEAL ECHOCARDIOGRAM (TEE);  Surgeon: [name], MD;  Location: [facility];  Service: Open Heart Surgery;  Laterality: N/A;  . TOTAL HIP ARTHROPLASTY Left 01/09/2014   DR Ninfa Linden  . TOTAL HIP ARTHROPLASTY Left 01/09/2014   Procedure: LEFT TOTAL HIP ARTHROPLASTY ANTERIOR APPROACH and Steroid Injection Right hip;  Surgeon: Mcarthur Rossetti, MD;  Location:  [facility];  Service: Orthopedics;  Laterality: Left;   Social History   Occupational History  . Not on file  Tobacco Use  . Smoking status: Former Smoker    Types: Cigarettes    Last attempt to quit: 02/04/1987    Years since quitting: 31.0  . Smokeless tobacco: Former Systems developer    Types: Chew    Quit date: 07/20/2009  . Tobacco comment: quit 35+yrs ago  Substance and Sexual Activity  . Alcohol use: No    Alcohol/week: 0.0 oz  . Drug use: No  . Sexual activity: Not Currently

## 2018-02-07 ENCOUNTER — Emergency Department (HOSPITAL_COMMUNITY): Payer: Medicare Other

## 2018-02-07 ENCOUNTER — Inpatient Hospital Stay (HOSPITAL_COMMUNITY)
Admission: EM | Admit: 2018-02-07 | Discharge: 2018-02-09 | DRG: 303 | Disposition: A | Discharge status: Home / Self Care | Payer: Medicare Other | Attending: Internal Medicine | Admitting: Internal Medicine

## 2018-02-07 ENCOUNTER — Telehealth: Payer: Self-pay | Admitting: Cardiovascular Disease

## 2018-02-07 ENCOUNTER — Encounter (HOSPITAL_COMMUNITY): Payer: Self-pay | Admitting: Emergency Medicine

## 2018-02-07 DIAGNOSIS — Z79899 Other long term (current) drug therapy: Secondary | ICD-10-CM

## 2018-02-07 DIAGNOSIS — R0789 Other chest pain: Secondary | ICD-10-CM | POA: Diagnosis not present

## 2018-02-07 DIAGNOSIS — E039 Hypothyroidism, unspecified: Secondary | ICD-10-CM | POA: Diagnosis not present

## 2018-02-07 DIAGNOSIS — Z86711 Personal history of pulmonary embolism: Secondary | ICD-10-CM

## 2018-02-07 DIAGNOSIS — I25110 Atherosclerotic heart disease of native coronary artery with unstable angina pectoris: Secondary | ICD-10-CM

## 2018-02-07 DIAGNOSIS — Z96642 Presence of left artificial hip joint: Secondary | ICD-10-CM | POA: Diagnosis present

## 2018-02-07 DIAGNOSIS — E038 Other specified hypothyroidism: Secondary | ICD-10-CM | POA: Diagnosis present

## 2018-02-07 DIAGNOSIS — R778 Other specified abnormalities of plasma proteins: Secondary | ICD-10-CM | POA: Diagnosis present

## 2018-02-07 DIAGNOSIS — Z7982 Long term (current) use of aspirin: Secondary | ICD-10-CM

## 2018-02-07 DIAGNOSIS — I480 Paroxysmal atrial fibrillation: Secondary | ICD-10-CM | POA: Diagnosis present

## 2018-02-07 DIAGNOSIS — I2510 Atherosclerotic heart disease of native coronary artery without angina pectoris: Secondary | ICD-10-CM | POA: Diagnosis not present

## 2018-02-07 DIAGNOSIS — Z888 Allergy status to other drugs, medicaments and biological substances status: Secondary | ICD-10-CM

## 2018-02-07 DIAGNOSIS — E785 Hyperlipidemia, unspecified: Secondary | ICD-10-CM | POA: Diagnosis present

## 2018-02-07 DIAGNOSIS — Z885 Allergy status to narcotic agent status: Secondary | ICD-10-CM

## 2018-02-07 DIAGNOSIS — Z79890 Hormone replacement therapy (postmenopausal): Secondary | ICD-10-CM

## 2018-02-07 DIAGNOSIS — I739 Peripheral vascular disease, unspecified: Secondary | ICD-10-CM | POA: Diagnosis present

## 2018-02-07 DIAGNOSIS — I110 Hypertensive heart disease with heart failure: Secondary | ICD-10-CM | POA: Diagnosis present

## 2018-02-07 DIAGNOSIS — I779 Disorder of arteries and arterioles, unspecified: Secondary | ICD-10-CM | POA: Diagnosis present

## 2018-02-07 DIAGNOSIS — Z8249 Family history of ischemic heart disease and other diseases of the circulatory system: Secondary | ICD-10-CM

## 2018-02-07 DIAGNOSIS — Z951 Presence of aortocoronary bypass graft: Secondary | ICD-10-CM | POA: Diagnosis not present

## 2018-02-07 DIAGNOSIS — Z86718 Personal history of other venous thrombosis and embolism: Secondary | ICD-10-CM

## 2018-02-07 DIAGNOSIS — I5042 Chronic combined systolic (congestive) and diastolic (congestive) heart failure: Secondary | ICD-10-CM | POA: Diagnosis not present

## 2018-02-07 DIAGNOSIS — R079 Chest pain, unspecified: Secondary | ICD-10-CM | POA: Diagnosis present

## 2018-02-07 DIAGNOSIS — R7989 Other specified abnormal findings of blood chemistry: Secondary | ICD-10-CM | POA: Diagnosis present

## 2018-02-07 DIAGNOSIS — Z87891 Personal history of nicotine dependence: Secondary | ICD-10-CM

## 2018-02-07 DIAGNOSIS — Z7901 Long term (current) use of anticoagulants: Secondary | ICD-10-CM

## 2018-02-07 DIAGNOSIS — Z95828 Presence of other vascular implants and grafts: Secondary | ICD-10-CM

## 2018-02-07 DIAGNOSIS — I10 Essential (primary) hypertension: Secondary | ICD-10-CM | POA: Diagnosis present

## 2018-02-07 DIAGNOSIS — I6529 Occlusion and stenosis of unspecified carotid artery: Secondary | ICD-10-CM | POA: Diagnosis present

## 2018-02-07 DIAGNOSIS — I252 Old myocardial infarction: Secondary | ICD-10-CM

## 2018-02-07 LAB — URINALYSIS, ROUTINE W REFLEX MICROSCOPIC
BACTERIA UA: NONE SEEN
BILIRUBIN URINE: NEGATIVE
GLUCOSE, UA: NEGATIVE mg/dL
KETONES UR: NEGATIVE mg/dL
LEUKOCYTES UA: NEGATIVE
NITRITE: NEGATIVE
PH: 5 (ref 5.0–8.0)
PROTEIN: NEGATIVE mg/dL
RBC / HPF: NONE SEEN RBC/hpf (ref 0–5)
SQUAMOUS EPITHELIAL / LPF: NONE SEEN
Specific Gravity, Urine: 1.008 (ref 1.005–1.030)

## 2018-02-07 LAB — CBC
HCT: 45.2 % (ref 39.0–52.0)
HEMOGLOBIN: 14.9 g/dL (ref 13.0–17.0)
MCH: 31.8 pg (ref 26.0–34.0)
MCHC: 33 g/dL (ref 30.0–36.0)
MCV: 96.4 fL (ref 78.0–100.0)
Platelets: 238 10*3/uL (ref 150–400)
RBC: 4.69 MIL/uL (ref 4.22–5.81)
RDW: 13.9 % (ref 11.5–15.5)
WBC: 10.3 10*3/uL (ref 4.0–10.5)

## 2018-02-07 LAB — LIPASE, BLOOD: Lipase: 76 U/L — ABNORMAL HIGH (ref 11–51)

## 2018-02-07 LAB — COMPREHENSIVE METABOLIC PANEL
ALBUMIN: 4.1 g/dL (ref 3.5–5.0)
ALT: 15 U/L — ABNORMAL LOW (ref 17–63)
ANION GAP: 10 (ref 5–15)
AST: 21 U/L (ref 15–41)
Alkaline Phosphatase: 49 U/L (ref 38–126)
BUN: 21 mg/dL — AB (ref 6–20)
CHLORIDE: 103 mmol/L (ref 101–111)
CO2: 25 mmol/L (ref 22–32)
Calcium: 9.3 mg/dL (ref 8.9–10.3)
Creatinine, Ser: 1.43 mg/dL — ABNORMAL HIGH (ref 0.61–1.24)
GFR calc Af Amer: 53 mL/min — ABNORMAL LOW (ref >60)
GFR calc non Af Amer: 46 mL/min — ABNORMAL LOW (ref >60)
GLUCOSE: 134 mg/dL — AB (ref 65–99)
POTASSIUM: 4.7 mmol/L (ref 3.5–5.1)
Sodium: 138 mmol/L (ref 135–145)
TOTAL PROTEIN: 7.6 g/dL (ref 6.5–8.1)
Total Bilirubin: 0.8 mg/dL (ref 0.3–1.2)

## 2018-02-07 LAB — BASIC METABOLIC PANEL
Anion gap: 11 (ref 5–15)
BUN: 23 mg/dL — AB (ref 6–20)
CHLORIDE: 105 mmol/L (ref 101–111)
CO2: 21 mmol/L — ABNORMAL LOW (ref 22–32)
Calcium: 9 mg/dL (ref 8.9–10.3)
Creatinine, Ser: 1.39 mg/dL — ABNORMAL HIGH (ref 0.61–1.24)
GFR calc Af Amer: 55 mL/min — ABNORMAL LOW (ref >60)
GFR, EST NON AFRICAN AMERICAN: 48 mL/min — AB (ref >60)
Glucose, Bld: 101 mg/dL — ABNORMAL HIGH (ref 65–99)
Potassium: 4.3 mmol/L (ref 3.5–5.1)
SODIUM: 137 mmol/L (ref 135–145)

## 2018-02-07 LAB — I-STAT TROPONIN, ED
TROPONIN I, POC: 0.08 ng/mL (ref 0.00–0.08)
Troponin i, poc: 0.07 ng/mL (ref 0.00–0.08)

## 2018-02-07 MED ORDER — ACETAMINOPHEN 325 MG PO TABS
650.0000 mg | ORAL_TABLET | ORAL | Status: DC | PRN
  Filled 2018-02-07: qty 2

## 2018-02-07 MED ORDER — FUROSEMIDE 20 MG PO TABS
40.0000 mg | ORAL_TABLET | Freq: Every day | ORAL | Status: DC
  Administered 2018-02-07: 40 mg via ORAL
  Filled 2018-02-07: qty 2

## 2018-02-07 MED ORDER — BISOPROLOL FUMARATE 5 MG PO TABS
2.5000 mg | ORAL_TABLET | Freq: Every day | ORAL | Status: DC
  Administered 2018-02-07 – 2018-02-09 (×3): 2.5 mg via ORAL
  Filled 2018-02-07: qty 0.5
  Filled 2018-02-07 (×2): qty 1

## 2018-02-07 MED ORDER — LEVOTHYROXINE SODIUM 50 MCG PO TABS
50.0000 ug | ORAL_TABLET | Freq: Every day | ORAL | Status: DC
  Administered 2018-02-08 – 2018-02-09 (×2): 50 ug via ORAL
  Filled 2018-02-07 (×2): qty 1
  Filled 2018-02-07: qty 2

## 2018-02-07 MED ORDER — ASPIRIN 81 MG PO CHEW
324.0000 mg | CHEWABLE_TABLET | Freq: Once | ORAL | Status: AC
Start: 2018-02-07 — End: 2018-02-07
  Administered 2018-02-07: 324 mg via ORAL
  Filled 2018-02-07: qty 4

## 2018-02-07 MED ORDER — CLONIDINE HCL 0.1 MG PO TABS
0.1000 mg | ORAL_TABLET | Freq: Once | ORAL | Status: AC
  Administered 2018-02-07: 0.1 mg via ORAL
  Filled 2018-02-07: qty 1

## 2018-02-07 MED ORDER — FUROSEMIDE 20 MG PO TABS
20.0000 mg | ORAL_TABLET | Freq: Every day | ORAL | Status: DC
  Administered 2018-02-08: 20 mg via ORAL
  Filled 2018-02-07 (×2): qty 1

## 2018-02-07 MED ORDER — POTASSIUM CHLORIDE CRYS ER 20 MEQ PO TBCR
40.0000 meq | EXTENDED_RELEASE_TABLET | Freq: Every day | ORAL | Status: DC
  Administered 2018-02-07: 40 meq via ORAL
  Filled 2018-02-07: qty 2

## 2018-02-07 MED ORDER — ROSUVASTATIN CALCIUM 5 MG PO TABS
5.0000 mg | ORAL_TABLET | Freq: Every day | ORAL | Status: DC
  Filled 2018-02-07: qty 1

## 2018-02-07 MED ORDER — TAMSULOSIN HCL 0.4 MG PO CAPS
0.4000 mg | ORAL_CAPSULE | Freq: Every day | ORAL | Status: DC
  Administered 2018-02-09: 0.4 mg via ORAL
  Filled 2018-02-07: qty 1

## 2018-02-07 MED ORDER — ONDANSETRON HCL 4 MG/2ML IJ SOLN: 4.0000 mg | Freq: Four times a day (QID) | INTRAMUSCULAR | Status: DC | PRN

## 2018-02-07 MED ORDER — ALPRAZOLAM 0.25 MG PO TABS: 0.2500 mg | ORAL_TABLET | Freq: Two times a day (BID) | ORAL | Status: DC | PRN

## 2018-02-07 MED ORDER — AMIODARONE HCL 200 MG PO TABS
200.0000 mg | ORAL_TABLET | Freq: Every day | ORAL | Status: DC
  Administered 2018-02-09: 200 mg via ORAL
  Filled 2018-02-07: qty 1

## 2018-02-07 MED ORDER — APIXABAN 5 MG PO TABS
5.0000 mg | ORAL_TABLET | Freq: Two times a day (BID) | ORAL | Status: DC
  Administered 2018-02-07 – 2018-02-09 (×4): 5 mg via ORAL
  Filled 2018-02-07 (×4): qty 1

## 2018-02-07 MED ORDER — ROSUVASTATIN CALCIUM 10 MG PO TABS
5.0000 mg | ORAL_TABLET | ORAL | Status: DC
  Administered 2018-02-07: 5 mg via ORAL
  Filled 2018-02-07: qty 1

## 2018-02-07 MED ORDER — POTASSIUM CHLORIDE CRYS ER 20 MEQ PO TBCR
20.0000 meq | EXTENDED_RELEASE_TABLET | Freq: Every day | ORAL | Status: DC
  Administered 2018-02-08 – 2018-02-09 (×2): 20 meq via ORAL
  Filled 2018-02-07 (×2): qty 1

## 2018-02-07 MED ORDER — NITROGLYCERIN 0.4 MG SL SUBL
0.4000 mg | SUBLINGUAL_TABLET | SUBLINGUAL | Status: DC | PRN
  Administered 2018-02-07 (×2): 0.4 mg via SUBLINGUAL
  Filled 2018-02-07 (×2): qty 1

## 2018-02-07 NOTE — ED Notes (Signed)
Resident in room.

## 2018-02-07 NOTE — ED Provider Notes (Signed)
[facility] EMERGENCY DEPARTMENT Provider Note   CSN: [id number] Arrival date & time: 02/07/18  1214     History   Chief Complaint Chief Complaint  Patient presents with  . Chest Pain    HPI Ryan [name] is a 77 y.o. male.  HPI  77 year old male history of coronary disease/CABG, congestive heart failure, myocardial infarction, peripheral vascular disease, pulmonary embolism, anticoagulation on Eliquis, on antiarrhythmics amiodarone, beta-blockers, Lasix, prior history of DVT, atrial fibrillation presents the emergency department with heart fluttering transient x1 episode yesterday and has had persistent kind of right upper posterior shoulder pain along with transient now resolved abdominal discomfort there was right upper quadrant.  Patient had a similar pain 3 years ago when he had his CABG and resolved spontaneously.  Patient denies any fever, recent trauma, illness, shortness of breath or chest pain currently.  Of note patient had a prior lap cholecystectomy.   Past Medical History:  Diagnosis Date  . Arthritis   . CAD (coronary artery disease)   . Carotid artery occlusion   . Cataract    Bil eyes/worse in left eye  . CHF (congestive heart failure) ([place])   . Chronic back pain   . DVT (deep venous thrombosis) ([place])   . Dysrhythmia   . Enlarged prostate    takes Rapaflo daily  . GERD (gastroesophageal reflux disease)    occasional  . History of colon polyps   . History of gout    has colchicine prn  . History of kidney stones   . Hyperlipidemia    takes Crestor daily  . Hypertension    takes Amlodipine daily  . Hypothyroidism   . Myocardial infarction ([place])   . Peripheral vascular disease ([place])   . Pulmonary emboli ([place]) 03/20/2015   elevated d-dimer, intermediate V/Q study, atypical chest pain and SOB. Start on Xarelto 20mg  BID for 3 month  . Renal insufficiency   . Shortness of breath dyspnea   . Urinary frequency   . Urinary urgency      Patient Active Problem List   Diagnosis Date Noted  . Chest pain 02/07/2018  . Elevated troponin 03/23/2017  . Sigmoid diverticulitis 03/23/2017  . Acute pyelonephritis 03/23/2017  . AKI (acute kidney injury) ([place]) 03/23/2017  . Wrist pain, acute, right   . Acute systolic heart failure ([place])   . Hypothyroidism   . Rapid atrial fibrillation ([place])   . Dyspnea 02/26/2017  . Shoulder blade pain 02/26/2017  . SOB (shortness of breath) 02/26/2017  . Chronic right shoulder pain   . Chronic systolic CHF (congestive heart failure) ([place])   . HLD (hyperlipidemia) 01/20/2017  . Chronic cholecystitis 07/27/2016  . PAD (peripheral artery disease) ([place]) 07/09/2015  . S/P CABG x 4 04/05/2015  . Coronary artery disease involving native coronary artery of native heart without angina pectoris   . Atypical chest pain 03/20/2015  . Carotid artery disease ([place]) 10/09/2014  . PVD (peripheral vascular disease) ([place]) 10/09/2014  . HTN (hypertension) 05/30/2014  . Arthritis of left hip 01/09/2014  . Status post THR (total hip replacement) 01/09/2014  . Spinal stenosis, lumbar 01/06/2013    Class: Diagnosis of  . Atherosclerosis of native artery of extremity with intermittent claudication ([place]) 10/18/2012    Past Surgical History:  Procedure Laterality Date  . APPENDECTOMY    . BACK SURGERY     5 times  . big toe surgery    . CARDIAC CATHETERIZATION     2010  dr Acie Fredrickson  . cataract surgery     left eye  . CHOLECYSTECTOMY N/A 07/27/2016   Procedure: LAPAROSCOPIC CHOLECYSTECTOMY;  Surgeon: Mickeal Skinner, MD;  Location: [facility];  Service: General;  Laterality: N/A;  . COLONOSCOPY    . CORONARY ARTERY BYPASS GRAFT N/A 04/05/2015   Procedure: CORONARY ARTERY BYPASS GRAFTING (CABG)X4 LIMA-LAD; SVG-DIAG1-DIAG2; SVG-PD;  Surgeon: [name], MD;  Location: [facility];  Service: Open Heart Surgery;  Laterality: N/A;  . CYSTOSCOPY    . ENDARTERECTOMY Left 04/24/2016   Procedure:  ENDARTERECTOMY LEFT CAROTID;  Surgeon: Mal Misty, MD;  Location: [facility];  Service: Vascular;  Laterality: Left;  . EYE SURGERY    . FEMORAL ARTERY - POPLITEAL ARTERY BYPASS GRAFT    . JOINT REPLACEMENT     shoulder  . LEFT HEART CATHETERIZATION WITH CORONARY ANGIOGRAM N/A 04/03/2015   Procedure: LEFT HEART CATHETERIZATION WITH CORONARY ANGIOGRAM;  Surgeon: Troy Sine, MD;  Location[facility] C[facility] LAB;  Service: Cardiovascular;  Laterality: N/A;  . LUMBAR LAMINECTOMY  01/06/2013   Procedure: MICRODISCECTOMY LUMBAR LAMINECTOMY;  Surgeon: Marybelle Killings, MD;  Location: [facility];  Service: Orthopedics;  Laterality: N/A;  L3-4 decompression  . LUMBAR LAMINECTOMY/DECOMPRESSION MICRODISCECTOMY  02/12/2012   Procedure: LUMBAR LAMINECTOMY/DECOMPRESSION MICRODISCECTOMY;  Surgeon: Floyce Stakes, MD;  Location: [facility];  Service: Neurosurgery;  Laterality: N/A;  Lumbar four-five laminectomy  . PATCH ANGIOPLASTY Left 04/24/2016   Procedure: LEFT CAROTID ARTERY PATCH ANGIOPLASTY;  Surgeon: Mal Misty, MD;  Location: [facility];  Service: Vascular;  Laterality: Left;  . STERIOD INJECTION Right 01/09/2014   Procedure: STEROID INJECTION;  Surgeon: Mcarthur Rossetti, MD;  Location: [facility];  Service: Orthopedics;  Laterality: Right;  . TEE WITHOUT CARDIOVERSION N/A 04/05/2015   Procedure: TRANSESOPHAGEAL ECHOCARDIOGRAM (TEE);  Surgeon: [name], MD;  Location: [facility];  Service: Open Heart Surgery;  Laterality: N/A;  . TOTAL HIP ARTHROPLASTY Left 01/09/2014   DR Ninfa Linden  . TOTAL HIP ARTHROPLASTY Left 01/09/2014   Procedure: LEFT TOTAL HIP ARTHROPLASTY ANTERIOR APPROACH and Steroid Injection Right hip;  Surgeon: Mcarthur Rossetti, MD;  Location: [facility];  Service: Orthopedics;  Laterality: Left;       Home Medications    Prior to Admission medications   Medication Sig Start Date End Date Taking? Authorizing Provider  amiodarone (PACERONE) 200 MG tablet Take 1 tablet (200 mg total) by mouth at  bedtime. Take this at night instead of morning 03/26/17  Yes Domenic Polite, MD  apixaban (ELIQUIS) 5 MG TABS tablet Take 1 tablet (5 mg total) by mouth 2 (two) times daily. 05/11/17  Yes [name], [name], MD  bisoprolol (ZEBETA) 5 MG tablet Take 0.5 tablets (2.5 mg total) by mouth daily. 05/06/17  Yes [name], [name], MD  furosemide (LASIX) 40 MG tablet Take 1 tablet (40 mg total) by mouth daily. Patient taking differently: Take 20 mg by mouth daily.  05/06/17  Yes [name], [name], MD  HYDROcodone-acetaminophen (NORCO/VICODIN) 5-325 MG tablet Take 1 tablet by mouth daily as needed (pain).   Yes [provider]  levothyroxine (UNITHROID) 50 MCG tablet Take 50 mcg by mouth daily before breakfast. "Unithroid"   Yes [provider]  meclizine (ANTIVERT) 25 MG tablet Take 25 mg by mouth 3 (three) times daily as needed for dizziness.  01/06/18  Yes [provider]  nitroGLYCERIN (NITROSTAT) 0.4 MG SL tablet Place 1 tablet (0.4 mg total) under the tongue every 5 (five) minutes  x 3 doses as needed for chest pain (Within 15 min call 911). 09/15/17  Yes [name], [name], MD  potassium chloride SA (K-DUR,KLOR-CON) 20 MEQ tablet Take 2 tablets (40 mEq total) by mouth daily. Patient taking differently: Take 20 mEq by mouth daily.  05/06/17  Yes [name], [name], MD  rosuvastatin (CRESTOR) 10 MG tablet Take 5 mg by mouth every Monday, Wednesday, and Friday.  07/06/17  Yes [provider]  tamsulosin (FLOMAX) 0.4 MG CAPS capsule Take 0.4 mg by mouth at bedtime. 03/01/17  Yes [provider]  levothyroxine (SYNTHROID, LEVOTHROID) 25 MCG tablet Take 1 tablet (25 mcg total) by mouth daily before breakfast. Patient not taking: Reported on 02/07/2018 03/02/17   Reyne Dumas, MD    Family History Family History  Problem Relation Age of Onset  . Heart disease Father   . Heart attack Father   . Heart disease Sister   . Hypertension Sister   . Heart attack Sister   .  Hypertension Mother   . Diabetes Son     Social History Social History   Tobacco Use  . Smoking status: Former Smoker    Types: Cigarettes    Last attempt to quit: 02/04/1987    Years since quitting: 31.0  . Smokeless tobacco: Former Systems developer    Types: Chew    Quit date: 07/20/2009  . Tobacco comment: quit 35+yrs ago  Substance Use Topics  . Alcohol use: No    Alcohol/week: 0.0 oz  . Drug use: No     Allergies   Codeine and Zetia [ezetimibe]   Review of Systems Review of Systems  Review of Systems  Constitutional: Negative for fever and chills.  HENT: Negative for ear pain, sore throat and trouble swallowing.   Eyes: Negative for pain and visual disturbance.  Respiratory: Negative for cough and shortness of breath.   Cardiovascular: see HPI Gastrointestinal: Negative for nausea, vomiting, abdominal pain and diarrhea.  Genitourinary: Negative for dysuria, urgency and frequency.  Musculoskeletal: see HPI Skin: Negative for rash and wound.  Neurological: Negative for dizziness, syncope, speech difficulty, weakness and numbness.   Physical Exam Updated Vital Signs BP (!) 164/82 (BP Location: Right Arm)   Pulse 83   Temp 99.2 F (37.3 C) (Oral)   Resp 14   Ht 5\' 7"  (1.702 m)   Wt 78 kg (172 lb)   SpO2 99%   BMI 26.94 kg/m   Physical Exam   Physical Exam Vitals:   02/07/18 2121 02/07/18 2130  BP: (!) 182/81 (!) 164/82  Pulse: (!) 57 83  Resp: 16 14  Temp:  99.2 F (37.3 C)  SpO2: 97% 99%   Constitutional: Patient is in no acute distress Head: Normocephalic and atraumatic.  Eyes: Extraocular motion intact, no scleral icterus Neck: Supple without meningismus, mass, or overt JVD Respiratory: Effort normal and breath sounds normal. No respiratory distress. CV: Heart regular rate and rhythm, no obvious murmurs.  Pulses +2 and symmetric Abdomen: Soft, non-tender, non-distended MSK: Extremities are atraumatic without deformity, ROM intact Skin: Warm, dry,  intact Neuro: Alert and oriented, no motor deficit noted Psychiatric: Mood and affect are normal.   ED Treatments / Results  Labs (all labs ordered are listed, but only abnormal results are displayed) Labs Reviewed  BASIC METABOLIC PANEL - Abnormal; Notable for the following components:      Result Value   CO2 21 (*)    Glucose, Bld 101 (*)    BUN 23 (*)  Creatinine, Ser 1.39 (*)    GFR calc non Af Amer 48 (*)    GFR calc Af Amer 55 (*)    All other components within normal limits  COMPREHENSIVE METABOLIC PANEL - Abnormal; Notable for the following components:   Glucose, Bld 134 (*)    BUN 21 (*)    Creatinine, Ser 1.43 (*)    ALT 15 (*)    GFR calc non Af Amer 46 (*)    GFR calc Af Amer 53 (*)    All other components within normal limits  LIPASE, BLOOD - Abnormal; Notable for the following components:   Lipase 76 (*)    All other components within normal limits  URINALYSIS, ROUTINE W REFLEX MICROSCOPIC - Abnormal; Notable for the following components:   Color, Urine STRAW (*)    Hgb urine dipstick SMALL (*)    All other components within normal limits  CBC  I-STAT TROPONIN, ED  I-STAT TROPONIN, ED    EKG  EKG Interpretation  Date/Time:  Monday February 07 2018 16:00:36 EST Ventricular Rate:  60 PR Interval:    QRS Duration: 103 QT Interval:  463 QTC Calculation: 463 R Axis:   60 Text Interpretation:  Sinus rhythm Prolonged PR interval Low voltage, precordial leads no significant change since earlier in the day Confirmed by Sherwood Gambler [phone]) on 02/07/2018 4:02:29 PM       Radiology Dg Chest 2 View  Result Date: 02/07/2018 CLINICAL DATA:  Chest pain. EXAM: CHEST  2 VIEW COMPARISON:  Radiographs of March 23, 2017. FINDINGS: Stable cardiomediastinal silhouette. Status post coronary artery bypass graft. Atherosclerosis of thoracic aorta is noted. No pneumothorax or pleural effusion is noted. Right lung is clear. Mild left basilar atelectasis is noted. Status  post left shoulder arthroplasty. IMPRESSION: Aortic atherosclerosis.  Mild left basilar subsegmental atelectasis. Electronically Signed   By: Marijo Conception, M.D.   On: 02/07/2018 15:10    Procedures Procedures (including critical care time)  Medications Ordered in ED Medications  nitroGLYCERIN (NITROSTAT) SL tablet 0.4 mg (0.4 mg Sublingual Given 02/07/18 2005)  levothyroxine (SYNTHROID, LEVOTHROID) tablet 50 mcg (not administered)  tamsulosin (FLOMAX) capsule 0.4 mg (not administered)  amiodarone (PACERONE) tablet 200 mg (not administered)  bisoprolol (ZEBETA) tablet 2.5 mg (2.5 mg Oral Given 02/07/18 1943)  apixaban (ELIQUIS) tablet 5 mg (5 mg Oral Given 02/07/18 1943)  acetaminophen (TYLENOL) tablet 650 mg (not administered)  ondansetron (ZOFRAN) injection 4 mg (not administered)  ALPRAZolam (XANAX) tablet 0.25 mg (not administered)  rosuvastatin (CRESTOR) tablet 5 mg (5 mg Oral Given 02/07/18 1942)  furosemide (LASIX) tablet 20 mg (not administered)  potassium chloride SA (K-DUR,KLOR-CON) CR tablet 20 mEq (not administered)  aspirin chewable tablet 324 mg (324 mg Oral Given 02/07/18 1639)  cloNIDine (CATAPRES) tablet 0.1 mg (0.1 mg Oral Given 02/07/18 2039)     Initial Impression / Assessment and Plan / ED Course  I have reviewed the triage vital signs and the nursing notes.  Pertinent labs & imaging results that were available during my care of the patient were reviewed by me and considered in my medical decision making (see chart for details).     77 year old male history of coronary disease/CABG, congestive heart failure, myocardial infarction, peripheral vascular disease, pulmonary embolism, anticoagulation on Eliquis, on antiarrhythmics amiodarone, beta-blockers, Lasix, prior history of DVT, atrial fibrillation presents the emergency department with heart fluttering transient x1 episode yesterday and has had persistent kind of right upper posterior shoulder pain along with  transient now resolved abdominal discomfort there was right upper quadrant.  Patient had a similar pain 3 years ago when he had his CABG and resolved spontaneously.  Patient denies any fever, recent trauma, illness, shortness of breath or chest pain currently.  Of note patient had a prior lap cholecystectomy.  Review of patient's labs shows no evidence of leukocytosis, stable H&H, negative electrolyte imbalance, baseline renal insufficiency,. Trop x 1 0.0  Patient was given aspirin/nitro in the emergency department and found to be hypertensive during this encounter 190s.  Patient has had an interval improvement with nitro.  Patient is high risk with history of coronary disease plan for admission observation trend labs.  Currently review of EKG/chest x-ray and no findings consistent with ACS/infection.  Admit hospitalist   Final Clinical Impressions(s) / ED Diagnoses   Final diagnoses:  Chest pain, unspecified type  Atypical chest pain    ED Discharge Orders    None       Willette Alma, DO 02/07/18 2153    Sherwood Gambler, MD 02/08/18 0003

## 2018-02-07 NOTE — ED Notes (Signed)
Patient brought back to room via wheelchair, able to ambulate to bed without complication.

## 2018-02-07 NOTE — ED Notes (Signed)
Results reviewed

## 2018-02-07 NOTE — ED Notes (Signed)
Admitting paged about BP

## 2018-02-07 NOTE — ED Notes (Signed)
Admitting at the bedside.  

## 2018-02-07 NOTE — Telephone Encounter (Signed)
New message  Patient calling with concerns of chest pain,  side pain, stomach pain, back and shoulder pain. Please call   Pt c/o of Chest Pain: STAT if CP now or developed within 24 hours  1. Are you having CP right now? NO  2. Are you experiencing any other symptoms (ex. SOB, nausea, vomiting, sweating)? SOB, nausea  3. How long have you been experiencing CP? 2 MONTHS, GETTING WORSE  4. Is your CP continuous or coming and going? COMING AND GOING  5. Have you taken Nitroglycerin? YES ?

## 2018-02-07 NOTE — ED Notes (Signed)
PAGED ADMITTING PER RN  

## 2018-02-07 NOTE — Telephone Encounter (Signed)
Follow up     Patient calling back, requesting nurse call him at  [phone].

## 2018-02-07 NOTE — ED Notes (Signed)
Pt continues to ask if he is being taken upstairs soon. This RN explain delay to pt and that he might be holding in ED for a few more hours. This RN ensured pt that we are starting his care in the ED and this RN will update him when that changes.

## 2018-02-07 NOTE — Telephone Encounter (Signed)
Pt c/o 7/10 CP; initially pt wanted to be seen in the office; spoke with Camnitz (DOD) and he suggested pt go to the ED. Pt agreed and will head to the ED asap. I offered to call the patient an ambulance, he declined and stated "mys wife will bring him, I am only a mile away from [facility]."

## 2018-02-07 NOTE — H&P (Signed)
History and Physical    Ryan [name] WJX:[id number] DOB: 1941-08-09 DOA: 02/07/2018  Referring MD/NP/PA: Dr Verta Ellen  PCP: Marton Redwood, MD   Outpatient Specialists:  Dr Grayland Jack   Patient coming from: Home  Chief Complaint: Chest pain  HPI: Ryan [name] is a 77 y.o. male with medical history significant of coronary artery disease status post CABG, chronic systolic CHF, history of DVT and PE, nephrolithiasis, and hypertension who presents to the emergency department with chest pain. Symptoms started on and off over the last 1 month. Last night it escalated with dizziness and nausea. He has some mild diaphoresis. Patient also felt weak in his feet. He took nitroglycerin and the chest pain went away. He had a similar episode in April 2018 where he came by and was seen and admitted by our team. He was seen by cardiology but no stress test was done. MI was ruled out. He followed up once a year with Dr. Katharina Caper and has an appointment coming up in May. Patient reported pain as 4 out of 10 the last time he had it. It radiates up to his left shoulder. He is on chronic anticoagulation. He had one episode of tachycardia last night which went away.      ED Course: Patient is seen and evaluated in the ED. He has received aspirin and nitroglycerin. Blood pressure was elevated with systolic in the 213Y. Improvement with initial nitroglycerin. Troponin noted to be 0 initially but now 0.08. Negative urinalysis. Mildly elevated creatinine at 1.43. EKG showed no significant change from previous. Hospitalist call for admission for MI rule out  Review of Systems: As per HPI otherwise 10 point review of systems negative.    Past Medical History:  Diagnosis Date  . Arthritis   . CAD (coronary artery disease)   . Carotid artery occlusion   . Cataract    Bil eyes/worse in left eye  . CHF (congestive heart failure) ([place])   . Chronic back pain   . DVT (deep venous thrombosis) ([place])   .  Dysrhythmia   . Enlarged prostate    takes Rapaflo daily  . GERD (gastroesophageal reflux disease)    occasional  . History of colon polyps   . History of gout    has colchicine prn  . History of kidney stones   . Hyperlipidemia    takes Crestor daily  . Hypertension    takes Amlodipine daily  . Hypothyroidism   . Myocardial infarction ([place])   . Peripheral vascular disease ([place])   . Pulmonary emboli ([place]) 03/20/2015   elevated d-dimer, intermediate V/Q study, atypical chest pain and SOB. Start on Xarelto 20mg  BID for 3 month  . Renal insufficiency   . Shortness of breath dyspnea   . Urinary frequency   . Urinary urgency     Past Surgical History:  Procedure Laterality Date  . APPENDECTOMY    . BACK SURGERY     5 times  . big toe surgery    . CARDIAC CATHETERIZATION     2010    dr Acie Fredrickson  . cataract surgery     left eye  . CHOLECYSTECTOMY N/A 07/27/2016   Procedure: LAPAROSCOPIC CHOLECYSTECTOMY;  Surgeon: Mickeal Skinner, MD;  Location: [facility];  Service: General;  Laterality: N/A;  . COLONOSCOPY    . CORONARY ARTERY BYPASS GRAFT N/A 04/05/2015   Procedure: CORONARY ARTERY BYPASS GRAFTING (CABG)X4 LIMA-LAD; SVG-DIAG1-DIAG2; SVG-PD;  Surgeon: [name], MD;  Location: [facility];  Service: Open Heart Surgery;  Laterality: N/A;  . CYSTOSCOPY    . ENDARTERECTOMY Left 04/24/2016   Procedure: ENDARTERECTOMY LEFT CAROTID;  Surgeon: Mal Misty, MD;  Location: [facility];  Service: Vascular;  Laterality: Left;  . EYE SURGERY    . FEMORAL ARTERY - POPLITEAL ARTERY BYPASS GRAFT    . JOINT REPLACEMENT     shoulder  . LEFT HEART CATHETERIZATION WITH CORONARY ANGIOGRAM N/A 04/03/2015   Procedure: LEFT HEART CATHETERIZATION WITH CORONARY ANGIOGRAM;  Surgeon: Troy Sine, MD;  Location[facility];  Service: Cardiovascular;  Laterality: N/A;  . LUMBAR LAMINECTOMY  01/06/2013   Procedure: MICRODISCECTOMY LUMBAR LAMINECTOMY;  Surgeon: Marybelle Killings, MD;  Location: [facility];  Service:  Orthopedics;  Laterality: N/A;  L3-4 decompression  . LUMBAR LAMINECTOMY/DECOMPRESSION MICRODISCECTOMY  02/12/2012   Procedure: LUMBAR LAMINECTOMY/DECOMPRESSION MICRODISCECTOMY;  Surgeon: Floyce Stakes, MD;  Location: [facility] [facility]URO ORS;  Service: Neurosurgery;  Laterality: N/A;  Lumbar four-five laminectomy  . PATCH ANGIOPLASTY Left 04/24/2016   Procedure: LEFT CAROTID ARTERY PATCH ANGIOPLASTY;  Surgeon: Mal Misty, MD;  Location: [place];  Service: Vascular;  Laterality: Left;  . STERIOD INJECTION Right 01/09/2014   Procedure: STEROID INJECTION;  Surgeon: Mcarthur Rossetti, MD;  Location: [facility];  Service: Orthopedics;  Laterality: Right;  . TEE WITHOUT CARDIOVERSION N/A 04/05/2015   Procedure: TRANSESOPHAGEAL ECHOCARDIOGRAM (TEE);  Surgeon: [name], MD;  Location: [facility];  Service: Open Heart Surgery;  Laterality: N/A;  . TOTAL HIP ARTHROPLASTY Left 01/09/2014   DR Ninfa Linden  . TOTAL HIP ARTHROPLASTY Left 01/09/2014   Procedure: LEFT TOTAL HIP ARTHROPLASTY ANTERIOR APPROACH and Steroid Injection Right hip;  Surgeon: Mcarthur Rossetti, MD;  Location: [place] [facility];  Service: Orthopedics;  Laterality: Left;   Social history: Patient lives with his wife. He is mobile without assistance. He occasionally uses a cane but for the most part does not need any support   reports that he quit smoking about 31 years ago. His smoking use included cigarettes. He quit smokeless tobacco use about 8 years ago. His smokeless tobacco use included chew. He reports that he does not drink alcohol or use drugs.  Allergies  Allergen Reactions  . Codeine Nausea And Vomiting       . Zetia [Ezetimibe] Other (See Comments)    Myalgias     Family History  Problem Relation Age of Onset  . Heart disease Father   . Heart attack Father   . Heart disease Sister   . Hypertension Sister   . Heart attack Sister   . Hypertension Mother   . Diabetes Son      Prior to Admission medications   Medication Sig  Start Date End Date Taking? Authorizing Provider  amiodarone (PACERONE) 200 MG tablet Take 1 tablet (200 mg total) by mouth at bedtime. Take this at night instead of morning 03/26/17   Domenic Polite, MD  apixaban (ELIQUIS) 5 MG TABS tablet Take 1 tablet (5 mg total) by mouth 2 (two) times daily. 05/11/17   [name], [name], MD  bisoprolol (ZEBETA) 5 MG tablet Take 0.5 tablets (2.5 mg total) by mouth daily. 05/06/17   [name], [name], MD  furosemide (LASIX) 40 MG tablet Take 1 tablet (40 mg total) by mouth daily. 05/06/17   [name], [name], MD  levothyroxine (SYNTHROID, LEVOTHROID) 25 MCG tablet Take 1 tablet (25 mcg total) by mouth daily before breakfast. 03/02/17   Reyne Dumas, MD  nitroGLYCERIN (NITROSTAT) 0.4 MG  SL tablet Place 1 tablet (0.4 mg total) under the tongue every 5 (five) minutes x 3 doses as needed for chest pain (Within 15 min call 911). 09/15/17   [name], [name], MD  potassium chloride SA (K-DUR,KLOR-CON) 20 MEQ tablet Take 2 tablets (40 mEq total) by mouth daily. 05/06/17   [name], [name], MD  rosuvastatin (CRESTOR) 10 MG tablet  07/06/17   [provider]  tamsulosin (FLOMAX) 0.4 MG CAPS capsule Take 0.4 mg by mouth at bedtime. 03/01/17   [provider]    Physical Exam: Vitals:   02/07/18 1636 02/07/18 1645 02/07/18 1700 02/07/18 1715  BP: (!) 166/69 (!) 160/69 (!) 174/80 (!) 170/67  Pulse: (!) 55 (!) 54 (!) 56 (!) 55  Resp: 14 16 12 15   Temp:      TempSrc:      SpO2: 97% 96% 98% 98%  Weight:      Height:          Constitutional: NAD, calm, comfortable Vitals:   02/07/18 1636 02/07/18 1645 02/07/18 1700 02/07/18 1715  BP: (!) 166/69 (!) 160/69 (!) 174/80 (!) 170/67  Pulse: (!) 55 (!) 54 (!) 56 (!) 55  Resp: 14 16 12 15   Temp:      TempSrc:      SpO2: 97% 96% 98% 98%  Weight:      Height:       Eyes: PERRL, lids and conjunctivae normal ENMT: Mucous membranes are moist. Posterior pharynx clear of any exudate or lesions.Normal dentition.    Neck: normal, supple, no masses, no thyromegaly Respiratory: clear to auscultation bilaterally, no wheezing, no crackles. Normal respiratory effort. No accessory muscle use.  Cardiovascular: Regular rate and rhythm, no murmurs / rubs / gallops. No extremity edema. 2+ pedal pulses. No carotid bruits.  Abdomen: no tenderness, no masses palpated. No hepatosplenomegaly. Bowel sounds positive.  Musculoskeletal: no clubbing / cyanosis. No joint deformity upper and lower extremities. Good ROM, no contractures. Normal muscle tone.  Skin: no rashes, lesions, ulcers. No induration Neurologic: CN 2-12 grossly intact. Sensation intact, DTR normal. Strength 5/5 in all 4.  Psychiatric: Normal judgment and insight. Alert and oriented x 3. Normal mood.    Labs on Admission: I have personally reviewed following labs and imaging studies  CBC: Recent Labs  Lab 02/07/18 1244  WBC 10.3  HGB 14.9  HCT 45.2  MCV 96.4  PLT 517   Basic Metabolic Panel: Recent Labs  Lab 02/07/18 1244 02/07/18 1610  NA 137 138  K 4.3 4.7  CL 105 103  CO2 21* 25  GLUCOSE 101* 134*  BUN 23* 21*  CREATININE 1.39* 1.43*  CALCIUM 9.0 9.3   GFR: Estimated Creatinine Clearance: 41.1 mL/min (A) (by C-G formula based on SCr of 1.43 mg/dL (H)). Liver Function Tests: Recent Labs  Lab 02/07/18 1610  AST 21  ALT 15*  ALKPHOS 49  BILITOT 0.8  PROT 7.6  ALBUMIN 4.1   Recent Labs  Lab 02/07/18 1610  LIPASE 76*   No results for input(s): AMMONIA in the last 168 hours. Coagulation Profile: No results for input(s): INR, PROTIME in the last 168 hours. Cardiac Enzymes: No results for input(s): CKTOTAL, CKMB, CKMBINDEX, TROPONINI in the last 168 hours. BNP (last 3 results) No results for input(s): PROBNP in the last 8760 hours. HbA1C: No results for input(s): HGBA1C in the last 72 hours. CBG: No results for input(s): GLUCAP in the last 168 hours. Lipid Profile: No results for input(s): CHOL, HDL,  LDLCALC, TRIG,  CHOLHDL, LDLDIRECT in the last 72 hours. Thyroid Function Tests: No results for input(s): TSH, T4TOTAL, FREET4, T3FREE, THYROIDAB in the last 72 hours. Anemia Panel: No results for input(s): VITAMINB12, FOLATE, FERRITIN, TIBC, IRON, RETICCTPCT in the last 72 hours. Urine analysis:    Component Value Date/Time   COLORURINE STRAW (A) 02/07/2018 1700   APPEARANCEUR CLEAR 02/07/2018 1700   LABSPEC 1.008 02/07/2018 1700   PHURINE 5.0 02/07/2018 1700   GLUCOSEU NEGATIVE 02/07/2018 1700   HGBUR SMALL (A) 02/07/2018 1700   BILIRUBINUR NEGATIVE 02/07/2018 1700   KETONESUR NEGATIVE 02/07/2018 1700   PROTEINUR NEGATIVE 02/07/2018 1700   UROBILINOGEN 1.0 04/04/2015 2320   NITRITE NEGATIVE 02/07/2018 1700   LEUKOCYTESUR NEGATIVE 02/07/2018 1700   Sepsis Labs: @LABRCNTIP (procalcitonin:4,lacticidven:4) )No results found for this or any previous visit (from the past 240 hour(s)).   Radiological Exams on Admission: Dg Chest 2 View  Result Date: 02/07/2018 CLINICAL DATA:  Chest pain. EXAM: CHEST  2 VIEW COMPARISON:  Radiographs of March 23, 2017. FINDINGS: Stable cardiomediastinal silhouette. Status post coronary artery bypass graft. Atherosclerosis of thoracic aorta is noted. No pneumothorax or pleural effusion is noted. Right lung is clear. Mild left basilar atelectasis is noted. Status post left shoulder arthroplasty. IMPRESSION: Aortic atherosclerosis.  Mild left basilar subsegmental atelectasis. Electronically Signed   By: Marijo Conception, M.D.   On: 02/07/2018 15:10    EKG: Independently reviewed. Showing normal sinus rhythm with a rate of 62 and prolonged PR interval. No significant ST changes. No significant change from previous EKG on 04/14/2017 Assessment/Plan Principal Problem:   Atypical chest pain Active Problems:   HTN (hypertension)   Carotid artery disease (HCC)   S/P CABG x 4   HLD (hyperlipidemia)   Hypothyroidism   Elevated troponin   Chest pain    #1 chest pain: Due to  patient's history of coronary artery disease and relief with nitroglycerin patient will be admitted for MI rule out. If troponin continues to rise we may consider cardiology consultation including possible intervention. In the meantime continue home regimen with aspirin, morphine as well as nitroglycerin.  #2 hypertension: Blood pressure is elevated. Patient has not had his blood pressure medications so far. Resume home regimen and adjust and needed  #3 hyperlipidemia: Patient is on Crestor 3 times a week. He is unable to tolerate statins and believes his symptoms may be caused by the Crestor. We will continue his Crestor per home dose but adjust accordingly. Will defer decision to his cardiologist.  #4 hypothyroidism: Continue levothyroxine.  #5 chronic systolic CHF: Patient's last EF was 45% in March 2018. He is currently on Lasix and Coreg. Appears to be well compensated. No change in therapy    DVT prophylaxis: Eliquis  Code Status: Full    Family Communication: Wife who is at bedside   Disposition Plan: Home with wife   Consults called: None at the moment  Admission status: Observation    Severity of Illness: The appropriate patient status for this patient is OBSERVATION. Observation status is judged to be reasonable and necessary in order to provide the required intensity of service to ensure the patient's safety. The patient's presenting symptoms, physical exam findings, and initial radiographic and laboratory data in the context of their medical condition is felt to place them at decreased risk for further clinical deterioration. Furthermore, it is anticipated that the patient will be medically stable for discharge from the hospital within 2 midnights of admission. The following factors support the  patient status of observation.   " The patient's presenting symptoms include chest pain. " The physical exam findings include no significant distress. " The initial radiographic and  laboratory data are mildly elevated troponin.     Barbette Merino MD [facility] Pager [phone]  If 7PM-7AM, please contact night-coverage [email or web address] Password [facility]  02/07/2018, 5:29 PM

## 2018-02-07 NOTE — ED Notes (Addendum)
Admitting paged regarding diet order

## 2018-02-07 NOTE — ED Triage Notes (Signed)
Pt states yesterday began having heart fluttering with pain to back in between shoulder blades, pain into lower back. Pain currently 6/10.

## 2018-02-07 NOTE — ED Notes (Signed)
Pt asking if he will be going upstairs soon. This RN explained delay to both pt and family multiple times. Will continue to update pt and family as this RN receives updates. Pt repositioned for comfort. Will page admitting  For pt diet order.

## 2018-02-07 NOTE — ED Notes (Signed)
Dinner tray ordered.

## 2018-02-08 ENCOUNTER — Observation Stay (HOSPITAL_COMMUNITY): Payer: Medicare Other

## 2018-02-08 ENCOUNTER — Other Ambulatory Visit: Payer: Self-pay

## 2018-02-08 ENCOUNTER — Observation Stay (HOSPITAL_BASED_OUTPATIENT_CLINIC_OR_DEPARTMENT_OTHER): Payer: Medicare Other

## 2018-02-08 DIAGNOSIS — Z885 Allergy status to narcotic agent status: Secondary | ICD-10-CM | POA: Diagnosis not present

## 2018-02-08 DIAGNOSIS — I5042 Chronic combined systolic (congestive) and diastolic (congestive) heart failure: Secondary | ICD-10-CM | POA: Diagnosis present

## 2018-02-08 DIAGNOSIS — R0789 Other chest pain: Secondary | ICD-10-CM | POA: Diagnosis not present

## 2018-02-08 DIAGNOSIS — Z951 Presence of aortocoronary bypass graft: Secondary | ICD-10-CM | POA: Diagnosis not present

## 2018-02-08 DIAGNOSIS — Z86718 Personal history of other venous thrombosis and embolism: Secondary | ICD-10-CM | POA: Diagnosis not present

## 2018-02-08 DIAGNOSIS — I25119 Atherosclerotic heart disease of native coronary artery with unspecified angina pectoris: Secondary | ICD-10-CM | POA: Diagnosis not present

## 2018-02-08 DIAGNOSIS — I739 Peripheral vascular disease, unspecified: Secondary | ICD-10-CM | POA: Diagnosis present

## 2018-02-08 DIAGNOSIS — Z96642 Presence of left artificial hip joint: Secondary | ICD-10-CM | POA: Diagnosis present

## 2018-02-08 DIAGNOSIS — I480 Paroxysmal atrial fibrillation: Secondary | ICD-10-CM

## 2018-02-08 DIAGNOSIS — I110 Hypertensive heart disease with heart failure: Secondary | ICD-10-CM | POA: Diagnosis present

## 2018-02-08 DIAGNOSIS — I779 Disorder of arteries and arterioles, unspecified: Secondary | ICD-10-CM | POA: Diagnosis not present

## 2018-02-08 DIAGNOSIS — I252 Old myocardial infarction: Secondary | ICD-10-CM | POA: Diagnosis not present

## 2018-02-08 DIAGNOSIS — R079 Chest pain, unspecified: Secondary | ICD-10-CM

## 2018-02-08 DIAGNOSIS — Z7901 Long term (current) use of anticoagulants: Secondary | ICD-10-CM | POA: Diagnosis not present

## 2018-02-08 DIAGNOSIS — Z79890 Hormone replacement therapy (postmenopausal): Secondary | ICD-10-CM | POA: Diagnosis not present

## 2018-02-08 DIAGNOSIS — E785 Hyperlipidemia, unspecified: Secondary | ICD-10-CM | POA: Diagnosis present

## 2018-02-08 DIAGNOSIS — Z888 Allergy status to other drugs, medicaments and biological substances status: Secondary | ICD-10-CM | POA: Diagnosis not present

## 2018-02-08 DIAGNOSIS — I361 Nonrheumatic tricuspid (valve) insufficiency: Secondary | ICD-10-CM | POA: Diagnosis not present

## 2018-02-08 DIAGNOSIS — I2510 Atherosclerotic heart disease of native coronary artery without angina pectoris: Secondary | ICD-10-CM | POA: Diagnosis present

## 2018-02-08 DIAGNOSIS — E7800 Pure hypercholesterolemia, unspecified: Secondary | ICD-10-CM | POA: Diagnosis not present

## 2018-02-08 DIAGNOSIS — Z87891 Personal history of nicotine dependence: Secondary | ICD-10-CM | POA: Diagnosis not present

## 2018-02-08 DIAGNOSIS — Z8249 Family history of ischemic heart disease and other diseases of the circulatory system: Secondary | ICD-10-CM | POA: Diagnosis not present

## 2018-02-08 DIAGNOSIS — Z79899 Other long term (current) drug therapy: Secondary | ICD-10-CM | POA: Diagnosis not present

## 2018-02-08 DIAGNOSIS — I10 Essential (primary) hypertension: Secondary | ICD-10-CM | POA: Diagnosis not present

## 2018-02-08 DIAGNOSIS — E039 Hypothyroidism, unspecified: Secondary | ICD-10-CM | POA: Diagnosis not present

## 2018-02-08 DIAGNOSIS — I6529 Occlusion and stenosis of unspecified carotid artery: Secondary | ICD-10-CM | POA: Diagnosis present

## 2018-02-08 DIAGNOSIS — Z7982 Long term (current) use of aspirin: Secondary | ICD-10-CM | POA: Diagnosis not present

## 2018-02-08 DIAGNOSIS — Z86711 Personal history of pulmonary embolism: Secondary | ICD-10-CM | POA: Diagnosis not present

## 2018-02-08 DIAGNOSIS — R748 Abnormal levels of other serum enzymes: Secondary | ICD-10-CM | POA: Diagnosis not present

## 2018-02-08 DIAGNOSIS — Z95828 Presence of other vascular implants and grafts: Secondary | ICD-10-CM | POA: Diagnosis not present

## 2018-02-08 LAB — TSH: TSH: 13.04 u[IU]/mL — ABNORMAL HIGH (ref 0.350–4.500)

## 2018-02-08 LAB — LIPID PANEL
Cholesterol: 161 mg/dL (ref 0–200)
HDL: 42 mg/dL (ref >40)
LDL CALC: 97 mg/dL (ref 0–99)
Total CHOL/HDL Ratio: 3.8 RATIO
Triglycerides: 110 mg/dL (ref <150)
VLDL: 22 mg/dL (ref 0–40)

## 2018-02-08 LAB — TROPONIN I
TROPONIN I: 0.06 ng/mL — AB (ref <0.03)
TROPONIN I: 0.06 ng/mL — AB (ref <0.03)
Troponin I: 0.05 ng/mL (ref <0.03)

## 2018-02-08 LAB — GLUCOSE, CAPILLARY: Glucose-Capillary: 114 mg/dL — ABNORMAL HIGH (ref 65–99)

## 2018-02-08 LAB — NM MYOCAR MULTI W/SPECT W/WALL MOTION / EF
CHL CUP RESTING HR STRESS: 52 {beats}/min
Peak HR: 65 {beats}/min

## 2018-02-08 LAB — ECHOCARDIOGRAM COMPLETE
HEIGHTINCHES: 66 in
Weight: 2664 oz

## 2018-02-08 MED ORDER — TECHNETIUM TC 99M TETROFOSMIN IV KIT
30.0000 | PACK | Freq: Once | INTRAVENOUS | Status: AC | PRN
  Administered 2018-02-08: 30 via INTRAVENOUS

## 2018-02-08 MED ORDER — PANTOPRAZOLE SODIUM 40 MG PO TBEC: 40.0000 mg | DELAYED_RELEASE_TABLET | Freq: Every day | ORAL | 3 refills | Status: DC

## 2018-02-08 MED ORDER — REGADENOSON 0.4 MG/5ML IV SOLN: 0.4000 mg | Freq: Once | INTRAVENOUS | Status: DC

## 2018-02-08 MED ORDER — TECHNETIUM TC 99M TETROFOSMIN IV KIT
10.0000 | PACK | Freq: Once | INTRAVENOUS | Status: AC | PRN
  Administered 2018-02-08: 10 via INTRAVENOUS

## 2018-02-08 MED ORDER — HYDROCODONE-ACETAMINOPHEN 5-325 MG PO TABS: 1.0000 | ORAL_TABLET | Freq: Two times a day (BID) | ORAL | 0 refills | Status: DC | PRN

## 2018-02-08 MED ORDER — REGADENOSON 0.4 MG/5ML IV SOLN
INTRAVENOUS | Status: AC
  Filled 2018-02-08: qty 5

## 2018-02-08 MED ORDER — NITROGLYCERIN 0.4 MG SL SUBL: 0.4000 mg | SUBLINGUAL_TABLET | SUBLINGUAL | 2 refills | Status: DC | PRN

## 2018-02-08 NOTE — Progress Notes (Signed)
  Echocardiogram 2D Echocardiogram has been performed.  Darlina Sicilian M 02/08/2018, 3:50 PM

## 2018-02-08 NOTE — Consult Note (Signed)
Cardiology Consultation:   Patient ID: ANACLETO BATTERMAN; [id number]; 10/31/1941   Admit date: 02/07/2018 Date of Consult: 02/08/2018  Primary Care Provider: Marton Redwood, MD Primary Cardiologist: Dr. Acie Fredrickson  Patient Profile:   JEANPAUL BIEHL is a 77 y.o. male with a hx of CAD s/p CABG x4 (LIMA>LAD, SVG>Diag 1 A and B, SVG>PDA), carotid artery disease s/p L CEA with Dr. Kellie Simmering in 2017, PAF on Eliquis, HTN, and HLD who is being seen today for the evaluation of chest and right shoulder pain at the request of Rai.   History of Present Illness:   Mr. Koral is a pleasant 77yo M who reports having right intermittent mid-shoulder blade/back pain which began Sunday afternoon. He states that he took one codeine tablet which relieved his shoulder pain however, on Sunday evening he began, having midsternal chest pain  rated a 7 out of 10 in severity with associated palpitations. He has a hx of PAF, diagnosed in 2018 and is subsequently on Xarelto. He proceeded to take one sublingual nitroglycerin which immediately relieved his chest pain at home. He has a hx of CABG in 2016 and denies having any chest pain since that time. Denies similarities b/w this episode of chest pain and previous episodes. He reports that he walks down his driveway and performs other activities without s/s of chest discomfort. He denies N/V, diaphoresis, SOB, dizziness, or syncope during his episode. He is followed by Dr. Acie Fredrickson and has an appointment in May, 2019.   In the emergency department the patient received one baby aspirin and one additional sublingual nitroglycerin and has remained pain free since admission. His blood pressures were elevated with systolic pressures in the 190s, however are now back to his baseline with in the 120-130 range.  His POC troponin levels have been mildly elevated 0.07 and 0.08.  His creatinine level is elevated, but this appears to be stable from baseline readings. His EKG was non-acute,  with no ST-T wave changes.  His last echocardiogram revealed an EF of 45% with sever WMA in 2018. He has no s/s of fluid volume overload and does not appear to be in acute on chronic heart failure.   It is difficult to determine if his symptoms are cardiac in nature, therefore given his past hx will schedule for him to have a [employer] today for further evaluation. There is a follow up Echo pending.    Past Medical History:  Diagnosis Date  . Arthritis   . CAD (coronary artery disease)   . Carotid artery occlusion   . Cataract    Bil eyes/worse in left eye  . CHF (congestive heart failure) ([place])   . Chronic back pain   . DVT (deep venous thrombosis) ([place])   . Dysrhythmia   . Enlarged prostate    takes Rapaflo daily  . GERD (gastroesophageal reflux disease)    occasional  . History of colon polyps   . History of gout    has colchicine prn  . History of kidney stones   . Hyperlipidemia    takes Crestor daily  . Hypertension    takes Amlodipine daily  . Hypothyroidism   . Myocardial infarction ([place])   . Peripheral vascular disease ([place])   . Pulmonary emboli ([place]) 03/20/2015   elevated d-dimer, intermediate V/Q study, atypical chest pain and SOB. Start on Xarelto 20mg  BID for 3 month  . Renal insufficiency   . Shortness of breath dyspnea   . Urinary frequency   .  Urinary urgency     Past Surgical History:  Procedure Laterality Date  . APPENDECTOMY    . BACK SURGERY     5 times  . big toe surgery    . CARDIAC CATHETERIZATION     2010    dr Acie Fredrickson  . cataract surgery     left eye  . CHOLECYSTECTOMY N/A 07/27/2016   Procedure: LAPAROSCOPIC CHOLECYSTECTOMY;  Surgeon: Mickeal Skinner, MD;  Location: [facility];  Service: General;  Laterality: N/A;  . COLONOSCOPY    . CORONARY ARTERY BYPASS GRAFT N/A 04/05/2015   Procedure: CORONARY ARTERY BYPASS GRAFTING (CABG)X4 LIMA-LAD; SVG-DIAG1-DIAG2; SVG-PD;  Surgeon: [name], MD;  Location: [place];  Service: Open  Heart Surgery;  Laterality: N/A;  . CYSTOSCOPY    . ENDARTERECTOMY Left 04/24/2016   Procedure: ENDARTERECTOMY LEFT CAROTID;  Surgeon: Mal Misty, MD;  Location: [facility];  Service: Vascular;  Laterality: Left;  . EYE SURGERY    . FEMORAL ARTERY - POPLITEAL ARTERY BYPASS GRAFT    . JOINT REPLACEMENT     shoulder  . LEFT HEART CATHETERIZATION WITH CORONARY ANGIOGRAM N/A 04/03/2015   Procedure: LEFT HEART CATHETERIZATION WITH CORONARY ANGIOGRAM;  Surgeon: Troy Sine, MD;  Location: [facility] CATH LAB;  Service: Cardiovascular;  Laterality: N/A;  . LUMBAR LAMINECTOMY  01/06/2013   Procedure: MICRODISCECTOMY LUMBAR LAMINECTOMY;  Surgeon: Marybelle Killings, MD;  Location: [facility];  Service: Orthopedics;  Laterality: N/A;  L3-4 decompression  . LUMBAR LAMINECTOMY/DECOMPRESSION MICRODISCECTOMY  02/12/2012   Procedure: LUMBAR LAMINECTOMY/DECOMPRESSION MICRODISCECTOMY;  Surgeon: Floyce Stakes, MD;  Location: [facility];  Service: Neurosurgery;  Laterality: N/A;  Lumbar four-five laminectomy  . PATCH ANGIOPLASTY Left 04/24/2016   Procedure: LEFT CAROTID ARTERY PATCH ANGIOPLASTY;  Surgeon: Mal Misty, MD;  Location: [facility];  Service: Vascular;  Laterality: Left;  . STERIOD INJECTION Right 01/09/2014   Procedure: STEROID INJECTION;  Surgeon: Mcarthur Rossetti, MD;  Location: [facility];  Service: Orthopedics;  Laterality: Right;  . TEE WITHOUT CARDIOVERSION N/A 04/05/2015   Procedure: TRANSESOPHAGEAL ECHOCARDIOGRAM (TEE);  Surgeon: [name], MD;  Location: [facility];  Service: Open Heart Surgery;  Laterality: N/A;  . TOTAL HIP ARTHROPLASTY Left 01/09/2014   DR Ninfa Linden  . TOTAL HIP ARTHROPLASTY Left 01/09/2014   Procedure: LEFT TOTAL HIP ARTHROPLASTY ANTERIOR APPROACH and Steroid Injection Right hip;  Surgeon: Mcarthur Rossetti, MD;  Location: [place] [facility];  Service: Orthopedics;  Laterality: Left;     Prior to Admission medications   Medication Sig Start Date End Date Taking? Authorizing Provider    amiodarone (PACERONE) 200 MG tablet Take 1 tablet (200 mg total) by mouth at bedtime. Take this at night instead of morning 03/26/17  Yes Domenic Polite, MD  apixaban (ELIQUIS) 5 MG TABS tablet Take 1 tablet (5 mg total) by mouth 2 (two) times daily. 05/11/17  Yes Melodye Swor, [name], MD  bisoprolol (ZEBETA) 5 MG tablet Take 0.5 tablets (2.5 mg total) by mouth daily. 05/06/17  Yes Javed Cotto, [name], MD  furosemide (LASIX) 40 MG tablet Take 1 tablet (40 mg total) by mouth daily. Patient taking differently: Take 20 mg by mouth daily.  05/06/17  Yes Yesenia Locurto, [name], MD  HYDROcodone-acetaminophen (NORCO/VICODIN) 5-325 MG tablet Take 1 tablet by mouth daily as needed (pain).   Yes [provider]  levothyroxine (UNITHROID) 50 MCG tablet Take 50 mcg by mouth daily before breakfast. "Unithroid"   Yes [provider]  meclizine (ANTIVERT) 25 MG  tablet Take 25 mg by mouth 3 (three) times daily as needed for dizziness.  01/06/18  Yes [provider]  nitroGLYCERIN (NITROSTAT) 0.4 MG SL tablet Place 1 tablet (0.4 mg total) under the tongue every 5 (five) minutes x 3 doses as needed for chest pain (Within 15 min call 911). 09/15/17  Yes Caelin Rosen, [name], MD  potassium chloride SA (K-DUR,KLOR-CON) 20 MEQ tablet Take 2 tablets (40 mEq total) by mouth daily. Patient taking differently: Take 20 mEq by mouth daily.  05/06/17  Yes Cyrstal Leitz, [name], MD  rosuvastatin (CRESTOR) 10 MG tablet Take 5 mg by mouth every Monday, Wednesday, and Friday.  07/06/17  Yes [provider]  tamsulosin (FLOMAX) 0.4 MG CAPS capsule Take 0.4 mg by mouth at bedtime. 03/01/17  Yes [provider]  levothyroxine (SYNTHROID, LEVOTHROID) 25 MCG tablet Take 1 tablet (25 mcg total) by mouth daily before breakfast. Patient not taking: Reported on 02/07/2018 03/02/17   Reyne Dumas, MD    Inpatient Medications: Scheduled Meds: . amiodarone  200 mg Oral QHS  . apixaban  5 mg Oral BID  . bisoprolol  2.5 mg  Oral Daily  . furosemide  20 mg Oral Daily  . levothyroxine  50 mcg Oral QAC breakfast  . potassium chloride SA  20 mEq Oral Daily  . rosuvastatin  5 mg Oral Once per day on Mon Wed Fri  . tamsulosin  0.4 mg Oral QHS   Continuous Infusions:  PRN Meds: acetaminophen, ALPRAZolam, nitroGLYCERIN, ondansetron (ZOFRAN) IV  Allergies:    Allergies  Allergen Reactions  . Codeine Nausea And Vomiting       . Zetia [Ezetimibe] Other (See Comments)    Myalgias     Social History:   Social History   Socioeconomic History  . Marital status: Married    Spouse name: Not on file  . Number of children: Not on file  . Years of education: Not on file  . Highest education level: Not on file  Social Needs  . Financial resource strain: Not on file  . Food insecurity - worry: Not on file  . Food insecurity - inability: Not on file  . Transportation needs - medical: Not on file  . Transportation needs - non-medical: Not on file  Occupational History  . Not on file  Tobacco Use  . Smoking status: Former Smoker    Types: Cigarettes    Last attempt to quit: 02/04/1987    Years since quitting: 31.0  . Smokeless tobacco: Former Systems developer    Types: Chew    Quit date: 07/20/2009  . Tobacco comment: quit 35+yrs ago  Substance and Sexual Activity  . Alcohol use: No    Alcohol/week: 0.0 oz  . Drug use: No  . Sexual activity: Not Currently  Other Topics Concern  . Not on file  Social History Narrative  . Not on file    Family History:   Family History  Problem Relation Age of Onset  . Heart disease Father   . Heart attack Father   . Heart disease Sister   . Hypertension Sister   . Heart attack Sister   . Hypertension Mother   . Diabetes Son    Family Status:  Family Status  Relation Name Status  . Father  Deceased  . Sister  Deceased  . Mother  Deceased at age 76  . Brother  Alive  . Son  (Not Specified)    ROS:  Please see the history of  present illness.  All other ROS reviewed  and negative.     Physical Exam/Data:   Vitals:   02/07/18 2247 02/07/18 2312 02/08/18 0531 02/08/18 0804  BP: (!) 124/59 139/63 120/61 (!) 106/49  Pulse: (!) 53 (!) 55 (!) 52 (!) 50  Resp: 18 20  18   Temp: 97.7 F (36.5 C) (!) 97.5 F (36.4 C) (!) 97.5 F (36.4 C) 98.5 F (36.9 C)  TempSrc: Oral Oral Oral Oral  SpO2: 96% 95% 98% 94%  Weight:  170 lb 3.2 oz (77.2 kg) 166 lb 8 oz (75.5 kg)   Height:  5\' 6"  (1.676 m)     No intake or output data in the 24 hours ending 02/08/18 0816 Filed Weights   02/07/18 1222 02/07/18 2312 02/08/18 0531  Weight: 172 lb (78 kg) 170 lb 3.2 oz (77.2 kg) 166 lb 8 oz (75.5 kg)   Body mass index is 26.87 kg/m.   General: Well developed, well nourished, NAD Skin: Warm, dry, intact  Head: Normocephalic, atraumatic, clear, moist mucus membranes. Neck: Negative for carotid bruits. No JVD Lungs: Mild bilateral crackles. No wheezes, rales, or rhonchi. Breathing is unlabored. Cardiovascular: RRR with S1 S2. No murmurs, rubs, or gallops Abdomen: Soft, non-tender, non-distended with normoactive bowel sounds.No obvious abdominal masses. MSK: Strength and tone appear normal for age. 5/5 in all extremities Extremities: No edema. No clubbing or cyanosis. DP/PT pulses 2+ bilaterally Neuro: Alert and oriented. No focal deficits. No facial asymmetry. MAE spontaneously. Psych: Responds to questions appropriately with normal affect.    EKG:  The EKG was personally reviewed and demonstrates: 02/07/18 NSR HR 68 Telemetry:  Telemetry was personally reviewed and demonstrates: 02/08/18 NSR HR 67  Relevant CV Studies:  ECHO: 02/27/17: Study Conclusions  - Left ventricle: The cavity size was normal. Wall thickness was   increased in a pattern of mild LVH. Systolic function was mildly   reduced. The estimated ejection fraction was 45%. The study was   not technically sufficient to allow evaluation of LV diastolic   dysfunction due to atrial fibrillation. -  Regional wall motion abnormality: Akinesis of the mid   anteroseptal, mid inferoseptal, and basal inferior myocardium;   severe hypokinesis of the mid inferior myocardium. - Aortic valve: Trileaflet; mildly thickened, mildly calcified   leaflets. Morphologically, there is at least mild, if not   moderate aortic valve stenosis. - Left atrium: The atrium was moderately to severely dilated. - Right ventricle: Systolic function was severely reduced  CATH: Unable to find images from pre-CABG cath   Laboratory Data:  Chemistry Recent Labs  Lab 02/07/18 1244 02/07/18 1610  NA 137 138  K 4.3 4.7  CL 105 103  CO2 21* 25  GLUCOSE 101* 134*  BUN 23* 21*  CREATININE 1.39* 1.43*  CALCIUM 9.0 9.3  GFRNONAA 48* 46*  GFRAA 55* 53*  ANIONGAP 11 10    Total Protein  Date Value Ref Range Status  02/07/2018 7.6 6.5 - 8.1 g/dL Final  01/20/2017 6.8 6.0 - 8.5 g/dL Final   Albumin  Date Value Ref Range Status  02/07/2018 4.1 3.5 - 5.0 g/dL Final  01/20/2017 4.1 3.5 - 4.8 g/dL Final   AST  Date Value Ref Range Status  02/07/2018 21 15 - 41 U/L Final   ALT  Date Value Ref Range Status  02/07/2018 15 (L) 17 - 63 U/L Final   Alkaline Phosphatase  Date Value Ref Range Status  02/07/2018 49 38 - 126 U/L Final  Total Bilirubin  Date Value Ref Range Status  02/07/2018 0.8 0.3 - 1.2 mg/dL Final   Bilirubin Total  Date Value Ref Range Status  01/20/2017 0.7 0.0 - 1.2 mg/dL Final   Hematology Recent Labs  Lab 02/07/18 1244  WBC 10.3  RBC 4.69  HGB 14.9  HCT 45.2  MCV 96.4  MCH 31.8  MCHC 33.0  RDW 13.9  PLT 238   Cardiac EnzymesNo results for input(s): TROPONINI in the last 168 hours.  Recent Labs  Lab 02/07/18 1301 02/07/18 1637  TROPIPOC 0.07 0.08    BNPNo results for input(s): BNP, PROBNP in the last 168 hours.  DDimer No results for input(s): DDIMER in the last 168 hours. TSH:  Lab Results  Component Value Date   TSH 11.473 (H) 02/26/2017   Lipids: Lab  Results  Component Value Date   CHOL 207 (H) 01/20/2017   HDL 45 01/20/2017   LDLCALC 138 (H) 01/20/2017   TRIG 121 01/20/2017   CHOLHDL 4.6 01/20/2017   HgbA1c: Lab Results  Component Value Date   HGBA1C 6.4 (H) 03/20/2015    Radiology/Studies:  Dg Chest 2 View  Result Date: 02/07/2018 CLINICAL DATA:  Chest pain. EXAM: CHEST  2 VIEW COMPARISON:  Radiographs of March 23, 2017. FINDINGS: Stable cardiomediastinal silhouette. Status post coronary artery bypass graft. Atherosclerosis of thoracic aorta is noted. No pneumothorax or pleural effusion is noted. Right lung is clear. Mild left basilar atelectasis is noted. Status post left shoulder arthroplasty. IMPRESSION: Aortic atherosclerosis.  Mild left basilar subsegmental atelectasis. Electronically Signed   By: Marijo Conception, M.D.   On: 02/07/2018 15:10    Assessment and Plan:   1.Chest/shoulder pain: -POC trop have been negative, 0.07>0.08 -Denies chest, neck or back pain since admission -Will schedule Myoview Stress for further evaluation given his elevated renal function. If procedure is high risk>>then can consider cath -Keep NPO -On bisoprolol, Eliquis, statin -Will start 81mg  ASA -Echocardiogram pending   2. Chronic combined systolic/diastolic congestive heart failure: -EF per echo 2018 45% with regional wall motion abnormality: akinesis of the mid anteroseptal, mid inferoseptal, and basal inferior myocardium; severe hypokinesis of the mid inferior myocardium. -Does not appear to be fluid volume overloaded -Weight, 172lb -I&O, no recorded I&O  -Cannot consider ACE/ARB given his steadily declining renal function -Continue BB, Lasix    4. Paroxysmal atrial fibrillation: -NSR today per tele/EKG review -Continue Eliquis. No reports of bleeding -On Amiodarone 200mg  QD -Hepatic panel 01/20/17 WNL -TSH pending -CHA2DS2VASc= 5  5. CAD s/p CABG 2016:  -LIMA>LAD, SVG>Diag 1 A and B, SVG>PDA- -Continue BB, statin. ? 81mg   ASA  6. Carotid artery disease: -s/p L CEA with Dr. Kellie Simmering. Last doppler 2017 with patent LC endarterectomy site w/o restenosis.  -Continue statin,? ASA 81mg    For questions or updates, please contact [place] Please consult [email or web address] for contact info under Cardiology/STEMI.   Lyndel Safe NP-C [facility] Pager: [phone] 02/08/2018 8:16 AM  Attending Note:   The patient was seen and examined.  Agree with assessment and plan as noted above.  Changes made to the above note as needed.  Patient seen and independently examined with Kathyrn Drown, NP .   We discussed all aspects of the encounter. I agree with the assessment and plan as stated above.  1.   Chest pain: The patient has a history of coronary artery disease.  He has had some atypical chest pains in the past that have proceeded myocardial infarction.  He now  presents with some shoulder pain which later developed into some chest pain.  The pain was relieved with sublingual nitroglycerin.  He is not had any recent episodes of angina.  I think that we should further investigate his coronary status with a Lexiscan Myoview study.  Schedule it for later today.  2.  Paroxysmal atrial fibrillation:   Currently in sinus rhythm.   I have spent a total of 40 minutes with patient reviewing hospital  notes , telemetry, EKGs, labs and examining patient as well as establishing an assessment and plan that was discussed with the patient. > 50% of time was spent in direct patient care.    Thayer Headings, Brooke Bonito., MD, [facility] 02/08/2018, 11:14 AM [address],  [place] Pager [phone]

## 2018-02-08 NOTE — Progress Notes (Signed)
    Patient presented for Lexiscan nuclear stress test. Tolerated procedure well. Pending final stress imaging result.  Daune Perch, AGNP-C 02/08/2018  12:24 PM Pager: [phone]

## 2018-02-08 NOTE — Progress Notes (Signed)
[facility]                                                                              Patient Demographics  Ryan [name], is a 77 y.o. male, DOB - February 19, 1941, KXF:[id number]  Admit date - 02/07/2018   Admitting Physician Elwyn Reach, MD  Outpatient Primary MD for the patient is Marton Redwood, MD  Outpatient specialists:   LOS - 0  days   Medical records reviewed and are as summarized below:    Chief Complaint  Patient presents with  . Chest Pain       Brief summary   Patient is a 77 year old male with history of CAD, CABG, proximally to intubation on her liquids, hypertension, hyperlipidemia presented with chest pain and right shoulder pain. Patient reported chest pain on Sunday evening 2 days ago, midsternal, associated palpitations. Patient took one sublingual nitroglycerin and hydrocodone which immediately relieved his chest pain.  Follow-up troponins mildly elevated 0.07, 0.08, cardiology consulted.  Assessment & Plan    Principal Problem:   Atypical chest pain in the setting of known CAD status post CABG x4 - Patient's symptoms somewhat atypical however mildly irritable to troponin 0.07, 0.08. - Currently chest pain resolved, continue aspirin, beta blocker, statin, and eliquis - Cardiology consulted, plan for nuclear medicine stress test today - Follow-up 2-D echo  Active Problems:   HTN (hypertension) - Currently stable, continue the Cipro alone    Carotid artery disease (HCC) - Continue aspirin, statin    HLD (hyperlipidemia) -Continue statin     Hypothyroidism - Continue Synthroid  Paroxysmally atrial fibrillation   - Normal sinus rhythm, heart rate controlled - Continue amiodarone, beta blocker - CHADS VASC 5, cont eliquis    Code Status: Full CODE STATUS  DVT Prophylaxis:  eliquis  Family Communication: Discussed in detail with the patient, all imaging results, lab results explained to the patient    Disposition  Plan: Pending stress test and echo  Time Spent in minutes   35 minutes  Procedures:    Consultants:   Cardiology  Antimicrobials:      Medications  Scheduled Meds: . amiodarone  200 mg Oral QHS  . apixaban  5 mg Oral BID  . bisoprolol  2.5 mg Oral Daily  . furosemide  20 mg Oral Daily  . levothyroxine  50 mcg Oral QAC breakfast  . potassium chloride SA  20 mEq Oral Daily  . regadenoson      . regadenoson  0.4 mg Intravenous Once  . rosuvastatin  5 mg Oral Once per day on Mon Wed Fri  . tamsulosin  0.4 mg Oral QHS   Continuous Infusions: PRN Meds:.acetaminophen, ALPRAZolam, nitroGLYCERIN, ondansetron (ZOFRAN) IV   Antibiotics   Anti-infectives (From admission, onward)   None        Subjective:   Ryan [name] was seen and examined today.  Currently denies any chest pain. No dizziness or palpitations. Patient denies  chest pain, shortness of breath, abdominal pain, N/V/D/C, new weakness, numbess, tingling. No acute events overnight.    Objective:   Vitals:   02/08/18 0804 02/08/18 1215 02/08/18 1221 02/08/18 1222  BP: (!) 106/49 133/75 (!) 149/73   Pulse: (!) 50  66 64  Resp: 18     Temp: 98.5 F (36.9 C)     TempSrc: Oral     SpO2: 94%     Weight:      Height:       No intake or output data in the 24 hours ending 02/08/18 1223   Wt Readings from Last 3 Encounters:  02/08/18 75.5 kg (166 lb 8 oz)  08/03/17 78 kg (172 lb)  07/09/17 77.1 kg (170 lb)     Exam  General: Alert and oriented x 3, NAD  Eyes:  HEENT:  Atraumatic, normocephalic, normal oropharynx  Cardiovascular: S1 S2 auscultated, no rubs, murmurs or gallops. Regular rate and rhythm.  Respiratory: Clear to auscultation bilaterally, no wheezing, rales or rhonchi  Gastrointestinal: Soft, nontender, nondistended, + bowel sounds  Ext: no pedal edema bilaterally  Neuro: AAOx3, Cr N's II- XII. Strength 5/5 upper and lower extremities bilaterally, speech clear  Musculoskeletal:  No digital cyanosis, clubbing  Skin: No rashes  Psych: Normal affect and demeanor, alert and oriented x3    Data Reviewed:  I have personally reviewed following labs and imaging studies  Micro Results No results found for this or any previous visit (from the past 240 hour(s)).  Radiology Reports Dg Chest 2 View  Result Date: 02/07/2018 CLINICAL DATA:  Chest pain. EXAM: CHEST  2 VIEW COMPARISON:  Radiographs of March 23, 2017. FINDINGS: Stable cardiomediastinal silhouette. Status post coronary artery bypass graft. Atherosclerosis of thoracic aorta is noted. No pneumothorax or pleural effusion is noted. Right lung is clear. Mild left basilar atelectasis is noted. Status post left shoulder arthroplasty. IMPRESSION: Aortic atherosclerosis.  Mild left basilar subsegmental atelectasis. Electronically Signed   By: Marijo Conception, M.D.   On: 02/07/2018 15:10   Xr Hips Bilat W Or W/o Pelvis 2v  Result Date: 02/01/2018 AP pelvis and lateral bilateral hips: No acute fractures. Bilateral hips well located. Status post left total hip arthroplasty without any complicating features. Slight impingement right hip. Right hip otherwise well maintained.    Lab Data:  CBC: Recent Labs  Lab 02/07/18 1244  WBC 10.3  HGB 14.9  HCT 45.2  MCV 96.4  PLT 637   Basic Metabolic Panel: Recent Labs  Lab 02/07/18 1244 02/07/18 1610  NA 137 138  K 4.3 4.7  CL 105 103  CO2 21* 25  GLUCOSE 101* 134*  BUN 23* 21*  CREATININE 1.39* 1.43*  CALCIUM 9.0 9.3   GFR: Estimated Creatinine Clearance: 39.7 mL/min (A) (by C-G formula based on SCr of 1.43 mg/dL (H)). Liver Function Tests: Recent Labs  Lab 02/07/18 1610  AST 21  ALT 15*  ALKPHOS 49  BILITOT 0.8  PROT 7.6  ALBUMIN 4.1   Recent Labs  Lab 02/07/18 1610  LIPASE 76*   No results for input(s): AMMONIA in the last 168 hours. Coagulation Profile: No results for input(s): INR, PROTIME in the last 168 hours. Cardiac Enzymes: Recent Labs    Lab 02/08/18 0745  TROPONINI 0.06*   BNP (last 3 results) No results for input(s): PROBNP in the last 8760 hours. HbA1C: No results for input(s): HGBA1C in the last 72 hours. CBG: Recent Labs  Lab 02/08/18 1122  GLUCAP 114*   Lipid Profile: Recent Labs    02/08/18 1003  CHOL 161  HDL 42  LDLCALC 97  TRIG 110  CHOLHDL 3.8   Thyroid Function Tests: Recent Labs  02/08/18 1003  TSH 13.040*   Anemia Panel: No results for input(s): VITAMINB12, FOLATE, FERRITIN, TIBC, IRON, RETICCTPCT in the last 72 hours. Urine analysis:    Component Value Date/Time   COLORURINE STRAW (A) 02/07/2018 1700   APPEARANCEUR CLEAR 02/07/2018 1700   LABSPEC 1.008 02/07/2018 1700   PHURINE 5.0 02/07/2018 1700   GLUCOSEU NEGATIVE 02/07/2018 1700   HGBUR SMALL (A) 02/07/2018 1700   BILIRUBINUR NEGATIVE 02/07/2018 1700   KETONESUR NEGATIVE 02/07/2018 1700   PROTEINUR NEGATIVE 02/07/2018 1700   UROBILINOGEN 1.0 04/04/2015 2320   NITRITE NEGATIVE 02/07/2018 1700   LEUKOCYTESUR NEGATIVE 02/07/2018 1700     Manroop Jakubowicz M.D. [facility] 02/08/2018, 12:23 PM  Pager: [phone] Between 7am to 7pm - call Pager - [phone]  After 7pm go to [email or web address] - password [email or web address]  Call night coverage person covering after 7pm

## 2018-02-08 NOTE — Care Management Obs Status (Signed)
[facility] NOTIFICATION   Patient Details  Name: TERRELL OSTRAND MRN: [id number] Date of Birth: Dec 21, 1941   Medicare Observation Status Notification Given:  Yes    Carles Collet, RN 02/08/2018, 2:50 PM

## 2018-02-08 NOTE — Progress Notes (Addendum)
   Stress myoview results shows mild reversible ischemia. I discussed the results with the patient and his wife.   Will keep patient in the hospital and make pt NPO after midnight. Dr. Acie Fredrickson to review results in the morning and make decision on further evaluation with cath vs medical therapy.   Daune Perch, AGNP-C [facility] 02/08/2018  4:24 PM Pager: [phone]   Attending Note: Pt has a very small area of peri-infarct ischemia. We discussed at great length. See progress note from Feb. 20, 2019:    Mertie Moores, MD  02/09/2018 5:16 PM    [address],  [address] [place], [place]  [phone] Pager [phone] Phone: [phone]; Fax: [phone]

## 2018-02-09 DIAGNOSIS — I10 Essential (primary) hypertension: Secondary | ICD-10-CM

## 2018-02-09 DIAGNOSIS — R748 Abnormal levels of other serum enzymes: Secondary | ICD-10-CM

## 2018-02-09 DIAGNOSIS — Z951 Presence of aortocoronary bypass graft: Secondary | ICD-10-CM

## 2018-02-09 DIAGNOSIS — R079 Chest pain, unspecified: Secondary | ICD-10-CM

## 2018-02-09 DIAGNOSIS — E039 Hypothyroidism, unspecified: Secondary | ICD-10-CM

## 2018-02-09 DIAGNOSIS — E7800 Pure hypercholesterolemia, unspecified: Secondary | ICD-10-CM

## 2018-02-09 DIAGNOSIS — I779 Disorder of arteries and arterioles, unspecified: Secondary | ICD-10-CM

## 2018-02-09 LAB — BASIC METABOLIC PANEL
ANION GAP: 13 (ref 5–15)
BUN: 30 mg/dL — AB (ref 6–20)
CHLORIDE: 103 mmol/L (ref 101–111)
CO2: 21 mmol/L — AB (ref 22–32)
Calcium: 8.9 mg/dL (ref 8.9–10.3)
Creatinine, Ser: 1.38 mg/dL — ABNORMAL HIGH (ref 0.61–1.24)
GFR calc Af Amer: 56 mL/min — ABNORMAL LOW (ref >60)
GFR, EST NON AFRICAN AMERICAN: 48 mL/min — AB (ref >60)
GLUCOSE: 115 mg/dL — AB (ref 65–99)
POTASSIUM: 4.5 mmol/L (ref 3.5–5.1)
Sodium: 137 mmol/L (ref 135–145)

## 2018-02-09 LAB — CBC
HEMATOCRIT: 43 % (ref 39.0–52.0)
Hemoglobin: 14.2 g/dL (ref 13.0–17.0)
MCH: 31.8 pg (ref 26.0–34.0)
MCHC: 33 g/dL (ref 30.0–36.0)
MCV: 96.4 fL (ref 78.0–100.0)
PLATELETS: 229 10*3/uL (ref 150–400)
RBC: 4.46 MIL/uL (ref 4.22–5.81)
RDW: 13.6 % (ref 11.5–15.5)
WBC: 10.7 10*3/uL — ABNORMAL HIGH (ref 4.0–10.5)

## 2018-02-09 MED ORDER — FUROSEMIDE 20 MG PO TABS: 20.0000 mg | ORAL_TABLET | Freq: Every day | ORAL | 0 refills | Status: DC

## 2018-02-09 MED ORDER — BISOPROLOL FUMARATE 5 MG PO TABS: 2.5000 mg | ORAL_TABLET | Freq: Every day | ORAL | 0 refills | Status: DC

## 2018-02-09 MED ORDER — SENNA 8.6 MG PO TABS: 1.0000 | ORAL_TABLET | Freq: Every day | ORAL | 0 refills | Status: DC

## 2018-02-09 MED ORDER — NITROGLYCERIN 0.4 MG SL SUBL: 0.4000 mg | SUBLINGUAL_TABLET | SUBLINGUAL | 0 refills | Status: DC | PRN

## 2018-02-09 MED ORDER — ROSUVASTATIN CALCIUM 5 MG PO TABS: 5.0000 mg | ORAL_TABLET | Freq: Every day | ORAL | 0 refills | Status: DC

## 2018-02-09 NOTE — Plan of Care (Signed)
VSS, no c/o Chest pain, SR to SB on tele. Patient aware and informed of ECHO in am

## 2018-02-09 NOTE — Discharge Summary (Signed)
Discharge Summary  Ryan [name]:[id number] DOB: Sep 13, 1941  PCP: Marton Redwood, MD  Admit date: 02/07/2018 Discharge date: 02/09/2018  Time spent: 25 minutes  Recommendations for Outpatient Follow-up:  1. Follow up with cardiology within a week 2. Discussed with Dr. Acie Fredrickson regarding borderline low BP. He recommended continue current medications that may affect lowering BP.   Discharge Diagnoses:  Active Hospital Problems   Diagnosis Date Noted  . Atypical chest pain 03/20/2015  . Chest pain 02/07/2018  . Elevated troponin 03/23/2017  . Hypothyroidism   . HLD (hyperlipidemia) 01/20/2017  . S/P CABG x 4 04/05/2015  . Coronary artery disease involving native coronary artery of native heart with angina pectoris ([place])   . Carotid artery disease ([place]) 10/09/2014  . HTN (hypertension) 05/30/2014    Resolved Hospital Problems  No resolved problems to display.    Discharge Condition: stable   Diet recommendation: resume previous diet  Vitals:   02/08/18 1926 02/09/18 0621  BP: (!) 97/48 (!) 104/56  Pulse: (!) 59 (!) 53  Resp: 17 16  Temp: 98.1 F (36.7 C) 97.8 F (36.6 C)  SpO2: 95% 100%    History of present illness:   Patient is a 77 year old male with history of CAD, CABG, proximally to intubation on her liquids, hypertension, hyperlipidemia presented with chest pain and right shoulder pain. Patient reported chest pain on Sunday evening 2 days ago, midsternal, associated palpitations. Patient took one sublingual nitroglycerin and hydrocodone which immediately relieved his chest pain.  Follow-up troponins mildly elevated 0.07, 0.08, cardiology consulted.  Stress Myoview 02/08/18 revealed a very small area of inferior ischemia  On the day of discharge the patient was hemodynamically stable. Denies chest pain and able to ambulate with no symptoms. Cardiology recommended discharge to follow up in the outpatient setting.   Hospital Course:  Principal Problem:  Atypical chest pain Active Problems:   HTN (hypertension)   Carotid artery disease (HCC)   Coronary artery disease involving native coronary artery of native heart with angina pectoris (HCC)   S/P CABG x 4   HLD (hyperlipidemia)   Hypothyroidism   Elevated troponin   Chest pain  Atypical chest pain in the setting of known CAD status post CABG x4 - Patient's symptoms somewhat atypical however mildly elevated troponin 0.07, 0.08. - Currently chest pain resolved, continue aspirin, beta blocker, statin, and eliquis - Cardiology consulted, nuclear medicine stress test as stated above.  Active Problems:   HTN (hypertension) - Currently stable, continue the Cipro alone    Carotid artery disease (HCC) - Continue aspirin, statin    HLD (hyperlipidemia) -Continue statin     Hypothyroidism - Continue Synthroid  Paroxysmally atrial fibrillation   - Normal sinus rhythm, heart rate controlled - Continue amiodarone, beta blocker - CHADS VASC 5, cont eliquis     Procedures:  Nuclear Stress test  Consultations:  cardiology  Discharge Exam: BP (!) 104/56 (BP Location: Right Arm)   Pulse (!) 53   Temp 97.8 F (36.6 C) (Oral)   Resp 16   Ht 5\' 6"  (1.676 m)   Wt 75 kg (165 lb 4.8 oz)   SpO2 100%   BMI 26.68 kg/m   General: 76 to CM WD WN NAD A&O x3 Cardiovascular: RRR no rubs or gallops Respiratory: CTA no rales or wheezes  Discharge Instructions You were cared for by a hospitalist during your hospital stay. If you have any questions about your discharge medications or the care you received while you were in  the hospital after you are discharged, you can call the unit and asked to speak with the hospitalist on call if the hospitalist that took care of you is not available. Once you are discharged, your primary care physician will handle any further medical issues. Please note that NO REFILLS for any discharge medications will be authorized once you are discharged, as it  is imperative that you return to your primary care physician (or establish a relationship with a primary care physician if you do not have one) for your aftercare needs so that they can reassess your need for medications and monitor your lab values.   Allergies as of 02/09/2018      Reactions   Codeine Nausea And Vomiting       Zetia [ezetimibe] Other (See Comments)   Myalgias       Medication List    TAKE these medications   amiodarone 200 MG tablet Commonly known as:  PACERONE Take 1 tablet (200 mg total) by mouth at bedtime. Take this at night instead of morning   apixaban 5 MG Tabs tablet Commonly known as:  ELIQUIS Take 1 tablet (5 mg total) by mouth 2 (two) times daily.   bisoprolol 5 MG tablet Commonly known as:  ZEBETA Take 0.5 tablets (2.5 mg total) by mouth daily. Start taking on:  02/10/2018   furosemide 20 MG tablet Commonly known as:  LASIX Take 1 tablet (20 mg total) by mouth daily. Start taking on:  02/10/2018 What changed:    medication strength  how much to take   HYDROcodone-acetaminophen 5-325 MG tablet Commonly known as:  NORCO/VICODIN Take 1 tablet by mouth every 12 (twelve) hours as needed for moderate pain or severe pain (pain). What changed:    when to take this  reasons to take this   meclizine 25 MG tablet Commonly known as:  ANTIVERT Take 25 mg by mouth 3 (three) times daily as needed for dizziness.   nitroGLYCERIN 0.4 MG SL tablet Commonly known as:  NITROSTAT Place 1 tablet (0.4 mg total) under the tongue every 5 (five) minutes as needed for chest pain (CP or SOB). What changed:    when to take this  reasons to take this   pantoprazole 40 MG tablet Commonly known as:  PROTONIX Take 1 tablet (40 mg total) by mouth daily.   potassium chloride SA 20 MEQ tablet Commonly known as:  K-DUR,KLOR-CON Take 2 tablets (40 mEq total) by mouth daily. What changed:  how much to take   rosuvastatin 5 MG tablet Commonly known as:   CRESTOR Take 1 tablet (5 mg total) by mouth daily. What changed:    medication strength  when to take this   senna 8.6 MG Tabs tablet Commonly known as:  SENOKOT Take 1 tablet (8.6 mg total) by mouth daily.   tamsulosin 0.4 MG Caps capsule Commonly known as:  FLOMAX Take 0.4 mg by mouth at bedtime.   UNITHROID 50 MCG tablet Generic drug:  levothyroxine Take 50 mcg by mouth daily before breakfast. "Unithroid" What changed:  Another medication with the same name was removed. Continue taking this medication, and follow the directions you see here.      Allergies  Allergen Reactions  . Codeine Nausea And Vomiting       . Zetia [Ezetimibe] Other (See Comments)    Myalgias    Follow-up Information    Marton Redwood, MD Follow up.   Specialty:  Internal Medicine Contact information: [place]  [place] [address] [phone]        [name], [name], MD Follow up.   Specialty:  Cardiology Contact information: [place] [address] [phone]            The results of significant diagnostics from this hospitalization (including imaging, microbiology, ancillary and laboratory) are listed below for reference.    Significant Diagnostic Studies: Dg Chest 2 View  Result Date: 02/07/2018 CLINICAL DATA:  Chest pain. EXAM: CHEST  2 VIEW COMPARISON:  Radiographs of March 23, 2017. FINDINGS: Stable cardiomediastinal silhouette. Status post coronary artery bypass graft. Atherosclerosis of thoracic aorta is noted. No pneumothorax or pleural effusion is noted. Right lung is clear. Mild left basilar atelectasis is noted. Status post left shoulder arthroplasty. IMPRESSION: Aortic atherosclerosis.  Mild left basilar subsegmental atelectasis. Electronically Signed   By: Marijo Conception, M.D.   On: 02/07/2018 15:10   Nm Myocar Multi W/spect W/wall Motion / Ef  Result Date: 02/08/2018  There was no ST segment deviation noted during stress.  No  T wave inversion was noted during stress.  Defect 1: There is a medium defect present in the mid inferior, mid inferolateral and apical inferior location.  Findings consistent with ischemia.  This is an intermediate risk study.  The left ventricular ejection fraction is mildly decreased (45-54%).  There is a medium size, mild severity, reversible defect in the mid and apical inferior and mid inferolateral leads consistent with mild ischemia (SDS =3).   Xr Hips Bilat W Or W/o Pelvis 2v  Result Date: 02/01/2018 AP pelvis and lateral bilateral hips: No acute fractures. Bilateral hips well located. Status post left total hip arthroplasty without any complicating features. Slight impingement right hip. Right hip otherwise well maintained.    Microbiology: No results found for this or any previous visit (from the past 240 hour(s)).   Labs: Basic Metabolic Panel: Recent Labs  Lab 02/07/18 1244 02/07/18 1610 02/09/18 0445  NA 137 138 137  K 4.3 4.7 4.5  CL 105 103 103  CO2 21* 25 21*  GLUCOSE 101* 134* 115*  BUN 23* 21* 30*  CREATININE 1.39* 1.43* 1.38*  CALCIUM 9.0 9.3 8.9   Liver Function Tests: Recent Labs  Lab 02/07/18 1610  AST 21  ALT 15*  ALKPHOS 49  BILITOT 0.8  PROT 7.6  ALBUMIN 4.1   Recent Labs  Lab 02/07/18 1610  LIPASE 76*   No results for input(s): AMMONIA in the last 168 hours. CBC: Recent Labs  Lab 02/07/18 1244 02/09/18 0445  WBC 10.3 10.7*  HGB 14.9 14.2  HCT 45.2 43.0  MCV 96.4 96.4  PLT 238 229   Cardiac Enzymes: Recent Labs  Lab 02/08/18 0745 02/08/18 1348 02/08/18 1824  TROPONINI 0.06* 0.05* 0.06*   BNP: BNP (last 3 results) Recent Labs    02/26/17 1209  BNP 535.5*    ProBNP (last 3 results) No results for input(s): PROBNP in the last 8760 hours.  CBG: Recent Labs  Lab 02/08/18 1122  GLUCAP 114*       Signed:  Kayleen Memos, MD [facility] 02/09/2018, 11:27 PM

## 2018-02-09 NOTE — Discharge Instructions (Signed)

## 2018-02-09 NOTE — Progress Notes (Signed)
Discharge order obtained.  IV removed intact, telemetry monitor removed.  Reviewed AVS with patient/family, including medications, activity/restrictions, follow-up appointments.  Patient and family verbalized understanding.  Questions asked and answered.  Belongings given to family/patient.  Copy of AVS signature form placed in chart.   

## 2018-02-09 NOTE — Progress Notes (Signed)
Progress Note  Patient Name: Ryan [name] Date of Encounter: 02/09/2018  Primary Cardiologist:  Liana Crocker   Subjective   Ryan [name] is a 77 y.o. male with a hx of CAD s/p CABG x4 (LIMA>LAD, SVG>Diag 1 A and B, SVG>PDA), carotid artery disease s/p L CEA with Dr. Kellie Simmering in 2017, PAF on Eliquis, HTN, and HLD who is being seen today for the evaluation of chest and right shoulder pain at the request of Rai.  Stress Myoview yesterday revealed a very small area of inferior ischemia  He is not had any further episodes of chest pain.  He is been able to work out in the yard without any difficulties.  Inpatient Medications    Scheduled Meds: . amiodarone  200 mg Oral QHS  . apixaban  5 mg Oral BID  . bisoprolol  2.5 mg Oral Daily  . furosemide  20 mg Oral Daily  . levothyroxine  50 mcg Oral QAC breakfast  . potassium chloride SA  20 mEq Oral Daily  . regadenoson  0.4 mg Intravenous Once  . rosuvastatin  5 mg Oral Once per day on Mon Wed Fri  . tamsulosin  0.4 mg Oral QHS   Continuous Infusions:  PRN Meds: acetaminophen, ALPRAZolam, nitroGLYCERIN, ondansetron (ZOFRAN) IV   Vital Signs    Vitals:   02/08/18 1414 02/08/18 1639 02/08/18 1926 02/09/18 0621  BP: (!) 105/50 (!) 95/49 (!) 97/48 (!) 104/56  Pulse: (!) 55 (!) 54 (!) 59 (!) 53  Resp: 18 19 17 16   Temp: 97.9 F (36.6 C) 98 F (36.7 C) 98.1 F (36.7 C) 97.8 F (36.6 C)  TempSrc: Oral Oral Oral Oral  SpO2: 95% 95% 95% 100%  Weight:    165 lb 4.8 oz (75 kg)  Height:        Intake/Output Summary (Last 24 hours) at 02/09/2018 0856 Last data filed at 02/09/2018 0018 Gross per 24 hour  Intake 220 ml  Output -  Net 220 ml   Filed Weights   02/07/18 2312 02/08/18 0531 02/09/18 0621  Weight: 170 lb 3.2 oz (77.2 kg) 166 lb 8 oz (75.5 kg) 165 lb 4.8 oz (75 kg)    Telemetry    NSR  - Personally Reviewed  ECG     NSR  - Personally Reviewed  Physical Exam   GEN: Elderly gentleman, no acute distress Neck:  No JVD Cardiac: RRR, no murmurs, rubs, or gallops.  Respiratory: Clear to auscultation bilaterally. GI: Soft, nontender, non-distended  MS: No edema; No deformity. Neuro:  Nonfocal  Psych: Normal affect   Labs    Chemistry Recent Labs  Lab 02/07/18 1244 02/07/18 1610 02/09/18 0445  NA 137 138 137  K 4.3 4.7 4.5  CL 105 103 103  CO2 21* 25 21*  GLUCOSE 101* 134* 115*  BUN 23* 21* 30*  CREATININE 1.39* 1.43* 1.38*  CALCIUM 9.0 9.3 8.9  PROT  --  7.6  --   ALBUMIN  --  4.1  --   AST  --  21  --   ALT  --  15*  --   ALKPHOS  --  49  --   BILITOT  --  0.8  --   GFRNONAA 48* 46* 48*  GFRAA 55* 53* 56*  ANIONGAP 11 10 13      Hematology Recent Labs  Lab 02/07/18 1244 02/09/18 0445  WBC 10.3 10.7*  RBC 4.69 4.46  HGB 14.9 14.2  HCT 45.2 43.0  MCV  96.4 96.4  MCH 31.8 31.8  MCHC 33.0 33.0  RDW 13.9 13.6  PLT 238 229    Cardiac Enzymes Recent Labs  Lab 02/08/18 0745 02/08/18 1348 02/08/18 1824  TROPONINI 0.06* 0.05* 0.06*    Recent Labs  Lab 02/07/18 1301 02/07/18 1637  TROPIPOC 0.07 0.08     BNPNo results for input(s): BNP, PROBNP in the last 168 hours.   DDimer No results for input(s): DDIMER in the last 168 hours.   Radiology    Dg Chest 2 View  Result Date: 02/07/2018 CLINICAL DATA:  Chest pain. EXAM: CHEST  2 VIEW COMPARISON:  Radiographs of March 23, 2017. FINDINGS: Stable cardiomediastinal silhouette. Status post coronary artery bypass graft. Atherosclerosis of thoracic aorta is noted. No pneumothorax or pleural effusion is noted. Right lung is clear. Mild left basilar atelectasis is noted. Status post left shoulder arthroplasty. IMPRESSION: Aortic atherosclerosis.  Mild left basilar subsegmental atelectasis. Electronically Signed   By: Marijo Conception, M.D.   On: 02/07/2018 15:10   Nm Myocar Multi W/spect W/wall Motion / Ef  Result Date: 02/08/2018  There was no ST segment deviation noted during stress.  No T wave inversion was noted during  stress.  Defect 1: There is a medium defect present in the mid inferior, mid inferolateral and apical inferior location.  Findings consistent with ischemia.  This is an intermediate risk study.  The left ventricular ejection fraction is mildly decreased (45-54%).  There is a medium size, mild severity, reversible defect in the mid and apical inferior and mid inferolateral leads consistent with mild ischemia (SDS =3).    Cardiac Studies     Patient Profile     77 y.o. male with a history of coronary artery disease-status post coronary artery bypass grafting.  He was admitted with one episode of chest discomfort.  Assessment & Plan    1.  Coronary artery disease: The patient has a history of coronary artery bypass grafting.  He is been relatively healthy and has been out working in the yard without difficulty.  He had one episode of chest discomfort several nights ago.  This resolved with nitroglycerin but it should be noted that the nitroglycerin was very old and took 10-15 minutes before the pain resolved.  His troponin levels are minimally elevated but his troponin levels are always chronically elevated.  The stress Myoview study revealed an inferior defect which was very small.  I have had a long discussion with the patient.  He feels great and does not necessarily want to have a heart catheterization at this time.  His heart rate and blood pressure fairly low so I do not think that we have room to add isosorbide at this point.  I think that he should be able to be discharged later today.  He will need a new prescription for sublingual nitroglycerin.  See him in the office several weeks for follow-up visit.  He is been instructed to call us right away if he has any further episodes of chest discomfort.  For questions or updates, please contact [facility] Please consult [email or web address] for contact info under Cardiology/STEMI.      Signed, Mertie Moores, MD  02/09/2018, 8:56 AM

## 2018-02-10 NOTE — Consult Note (Signed)
            [facility] CM Primary Care Navigator  02/10/2018  RIDER ERMIS 09-Mar-1941 [id number]   Went to seepatient at the bedsideto identify possible discharge needs but he was alreadydischarged per staff report.  Patient was discharged home yesterday.  PerMD note,patient was evaluated for chest pain and right shoulder pain. Cardiology recommended discharge to follow up in the outpatient setting.  Patient has discharge instruction to follow-up with primary care provider and to follow-up with cardiology within a week.   For additional questions please contact:  Edwena Felty A. Isobella Ascher, BSN, RN-BC [facility] PRIMARY CARE Navigator Cell: [phone]

## 2018-02-16 ENCOUNTER — Ambulatory Visit: Payer: Medicare Other | Admitting: Physician Assistant

## 2018-02-23 ENCOUNTER — Ambulatory Visit (INDEPENDENT_AMBULATORY_CARE_PROVIDER_SITE_OTHER): Payer: Medicare Other | Admitting: Orthopaedic Surgery

## 2018-02-28 ENCOUNTER — Encounter (INDEPENDENT_AMBULATORY_CARE_PROVIDER_SITE_OTHER): Payer: Self-pay | Admitting: Physician Assistant

## 2018-02-28 ENCOUNTER — Ambulatory Visit (INDEPENDENT_AMBULATORY_CARE_PROVIDER_SITE_OTHER): Payer: Medicare Other | Admitting: Physician Assistant

## 2018-02-28 VITALS — Ht 66.0 in | Wt 163.0 lb

## 2018-02-28 DIAGNOSIS — M7061 Trochanteric bursitis, right hip: Secondary | ICD-10-CM

## 2018-02-28 DIAGNOSIS — M7062 Trochanteric bursitis, left hip: Secondary | ICD-10-CM

## 2018-02-28 NOTE — Addendum Note (Signed)
Addended by: Meyer Cory on: 02/28/2018 10:00 AM   Modules accepted: Orders

## 2018-02-28 NOTE — Progress Notes (Signed)
HPI: Mr. Sweetser returns today status post bilateral injections both hips for trochanteric bursitis.  He states that the left hip no longer bothers him.  Right hip is 50% better still having some pain in the lateral aspect of the hip.  Is now 4 years status post left total hip arthroplasty.  He has been hospitalized since he was last seen due to  chest pain.  He denies any right groin pain.  No radicular symptoms down either leg.  Physical exam: Bilateral hips he has good range of motion of both hips without pain.  Left hip no tenderness over the trochanteric region.  Tenderness over the right greater trochanteric region with palpation.  Ambulates without any assistive devices.  Impression: Left hip trochanteric bursitis resolved Right hip trochanteric bursitis   Plan: Discussed with him that we will send him to physical therapy and we will not place him on any NSAIDs due to his cardiac history.  He will work on IT band stretching home exercise program and modalities to the right hip.  Although is [employer].  I do remember cortisone injections in both hips or trochanteric bursitis and therefore he could have another cortisone injection as early as May if his pain persist despite the physical therapy.

## 2018-03-08 ENCOUNTER — Encounter: Payer: Self-pay | Admitting: Physical Therapy

## 2018-03-08 ENCOUNTER — Ambulatory Visit: Payer: Medicare Other | Attending: Physician Assistant | Admitting: Physical Therapy

## 2018-03-08 ENCOUNTER — Telehealth: Payer: Self-pay | Admitting: Cardiovascular Disease

## 2018-03-08 DIAGNOSIS — M25652 Stiffness of left hip, not elsewhere classified: Secondary | ICD-10-CM | POA: Diagnosis not present

## 2018-03-08 DIAGNOSIS — M25552 Pain in left hip: Secondary | ICD-10-CM | POA: Insufficient documentation

## 2018-03-08 DIAGNOSIS — M6281 Muscle weakness (generalized): Secondary | ICD-10-CM | POA: Insufficient documentation

## 2018-03-08 DIAGNOSIS — R262 Difficulty in walking, not elsewhere classified: Secondary | ICD-10-CM | POA: Insufficient documentation

## 2018-03-08 DIAGNOSIS — M25651 Stiffness of right hip, not elsewhere classified: Secondary | ICD-10-CM | POA: Diagnosis not present

## 2018-03-08 DIAGNOSIS — M25551 Pain in right hip: Secondary | ICD-10-CM | POA: Insufficient documentation

## 2018-03-08 NOTE — Therapy (Signed)
[place], [place], [address] Phone: [phone]   Fax:  [phone]  Physical Therapy Evaluation  Patient Details  Name: Ryan [name] MRN: [id number] Date of Birth: [birth year] Referring Provider: Zollie Beckers, MD   Encounter Date: 03/08/2018  PT End of Session - 03/08/18 1252    Visit Number  1    Number of Visits  13    Date for PT Re-Evaluation  04/19/18    Authorization Type  Medicare    PT Start Time  1147    PT Stop Time  1235    PT Time Calculation (min)  48 min    Activity Tolerance  Patient tolerated treatment well    Behavior During Therapy  [facility] for tasks assessed/performed       Past Medical History:  Diagnosis Date  . Arthritis   . CAD (coronary artery disease)   . Carotid artery occlusion   . Cataract    Bil eyes/worse in left eye  . CHF (congestive heart failure) ([place])   . Chronic back pain   . DVT (deep venous thrombosis) ([place])   . Dysrhythmia   . Enlarged prostate    takes Rapaflo daily  . GERD (gastroesophageal reflux disease)    occasional  . History of colon polyps   . History of gout    has colchicine prn  . History of kidney stones   . Hyperlipidemia    takes Crestor daily  . Hypertension    takes Amlodipine daily  . Hypothyroidism   . Myocardial infarction ([place])   . Peripheral vascular disease ([place])   . Pulmonary emboli ([place]) 03/20/2015   elevated d-dimer, intermediate V/Q study, atypical chest pain and SOB. Start on Xarelto 20mg  BID for 3 month  . Renal insufficiency   . Shortness of breath dyspnea   . Urinary frequency   . Urinary urgency     Past Surgical History:  Procedure Laterality Date  . APPENDECTOMY    . BACK SURGERY     5 times  . big toe surgery    . CARDIAC CATHETERIZATION     2010    dr Acie Fredrickson  . cataract surgery     left eye  . CHOLECYSTECTOMY N/A 07/27/2016   Procedure: LAPAROSCOPIC CHOLECYSTECTOMY;  Surgeon: Mickeal Skinner,  MD;  Location: [place];  Service: General;  Laterality: N/A;  . COLONOSCOPY    . CORONARY ARTERY BYPASS GRAFT N/A 04/05/2015   Procedure: CORONARY ARTERY BYPASS GRAFTING (CABG)X4 LIMA-LAD; SVG-DIAG1-DIAG2; SVG-PD;  Surgeon: [name], MD;  Location: [place];  Service: Open Heart Surgery;  Laterality: N/A;  . CYSTOSCOPY    . ENDARTERECTOMY Left 04/24/2016   Procedure: ENDARTERECTOMY LEFT CAROTID;  Surgeon: Mal Misty, MD;  Location: [place];  Service: Vascular;  Laterality: Left;  . EYE SURGERY    . FEMORAL ARTERY - POPLITEAL ARTERY BYPASS GRAFT    . JOINT REPLACEMENT     shoulder  . LEFT HEART CATHETERIZATION WITH CORONARY ANGIOGRAM N/A 04/03/2015   Procedure: LEFT HEART CATHETERIZATION WITH CORONARY ANGIOGRAM;  Surgeon: [place] Sine, MD;  Location: [facility] CATH LAB;  Service: Cardiovascular;  Laterality: N/A;  . LUMBAR LAMINECTOMY  01/06/2013   Procedure: MICRODISCECTOMY LUMBAR LAMINECTOMY;  Surgeon: Marybelle Killings, MD;  Location: [place];  Service: Orthopedics;  Laterality: N/A;  L3-4 decompression  . LUMBAR LAMINECTOMY/DECOMPRESSION MICRODISCECTOMY  02/12/2012   Procedure: LUMBAR LAMINECTOMY/DECOMPRESSION MICRODISCECTOMY;  Surgeon: Floyce Stakes, MD;  Location: [facility]  NEURO ORS;  Service: Neurosurgery;  Laterality: N/A;  Lumbar four-five laminectomy  . PATCH ANGIOPLASTY Left 04/24/2016   Procedure: LEFT CAROTID ARTERY PATCH ANGIOPLASTY;  Surgeon: Mal Misty, MD;  Location: [place];  Service: Vascular;  Laterality: Left;  . STERIOD INJECTION Right 01/09/2014   Procedure: STEROID INJECTION;  Surgeon: Mcarthur Rossetti, MD;  Location: [place];  Service: Orthopedics;  Laterality: Right;  . TEE WITHOUT CARDIOVERSION N/A 04/05/2015   Procedure: TRANSESOPHAGEAL ECHOCARDIOGRAM (TEE);  Surgeon: [name], MD;  Location: [place];  Service: Open Heart Surgery;  Laterality: N/A;  . TOTAL HIP ARTHROPLASTY Left 01/09/2014   DR Ninfa Linden  . TOTAL HIP ARTHROPLASTY Left 01/09/2014   Procedure: LEFT  TOTAL HIP ARTHROPLASTY ANTERIOR APPROACH and Steroid Injection Right hip;  Surgeon: Mcarthur Rossetti, MD;  Location: [place];  Service: Orthopedics;  Laterality: Left;    There were no vitals filed for this visit.   Subjective Assessment - 03/08/18 1152    Subjective  My pain is under control when I am sitting but when I walk to my mail box ( about 400 feet) I start having pain in both of my hips. walk has a little incline.  I received a cortisone shot in both hips 3 weeks ago.      Pertinent History  HTN, CAD  See extensive medical history hospitalized for heart issues last month.  Left > 3 yrs THA, , left TSR > 10 years, Back surgery x 5     Limitations  Standing;Walking;House hold activities    How long can you sit comfortably?  unlimited    How long can you stand comfortably?  30 to 35 minutes. fatigues but no pain    How long can you walk comfortably?  I can only walk about 400 feet with pain    Diagnostic tests  x rays    Patient Stated Goals  I want to walk without pain, I would like to shop without being dependent on a shopping cart    Currently in Pain?  Yes    Pain Score  9  when walking    Pain Location  Hip    Pain Orientation  Left    Pain Onset  More than a month ago    Pain Frequency  Intermittent    Aggravating Factors   walking,           [facility] PT Assessment - 03/08/18 1200      Assessment   Medical Diagnosis  trochanteric bursitis of bil hips    Referring Provider  Zollie Beckers, MD    Onset Date/Surgical Date  -- around 7 years ago    Hand Dominance  Right    Next MD Visit  May 2019      Precautions   Precautions  None      Restrictions   Weight Bearing Restrictions  No      Balance Screen   Has the patient fallen in the past 6 months  No    Has the patient had a decrease in activity level because of a fear of falling?   No    Is the patient reluctant to leave their home because of a fear of falling?   No      Home Environment   Living  Environment  Private residence    Living Arrangements  Spouse/significant other    Type of [place] to enter  Entrance Stairs-Number of Steps  2    Entrance Stairs-Rails  Right    Home Layout  Two level      Prior Function   Level of Independence  Independent    Vocation  Retired    Biomedical scientist  former ranger at [employer]   Overall Cognitive Status  Within Functional Limits for tasks assessed      Observation/Other Assessments   Focus on Therapeutic Outcomes (FOTO)   FOTO intake 51% limitation 49%  predicted 43%      Sensation   Light Touch  Appears Intact      Posture/Postural Control   Posture/Postural Control  Postural limitations    Postural Limitations  Forward head;Rounded Shoulders;Anterior pelvic tilt      ROM / Strength   AROM / PROM / Strength  AROM;Strength      AROM   Overall AROM   Deficits    Right Hip Extension  -10    Right Hip Flexion  105    Right Hip External Rotation   35    Right Hip Internal Rotation   20 tight and pain    Right Hip ABduction  44    Left Hip Extension  -15    Left Hip Flexion  100    Left Hip External Rotation   45    Left Hip Internal Rotation   18 tight and pain    Left Hip ABduction  40      Strength   Overall Strength  Deficits    Right Hip Flexion  4-/5    Right Hip Extension  2+/5    Right Hip ABduction  3-/5    Left Hip Flexion  4-/5    Left Hip Extension  2+/5    Left Hip ABduction  3-/5    Right Knee Flexion  4/5    Right Knee Extension  4/5    Left Knee Flexion  4/5    Left Knee Extension  4/5      Flexibility   Hamstrings  left 50 right 69    ITB  Pt with tight IT Band pain illicited with crossing midline with legs extended      Palpation   Palpation comment  tenderness bil post bil greater trochanter.      Ambulation/Gait   Gait Pattern  Decreased stride length;Trunk flexed    Ambulation Surface  Level    Gait velocity  2.20 ft/sec              Objective measurements completed on examination: See above findings.      [place] Adult PT Treatment/Exercise - 03/08/18 1200      Knee/Hip Exercises: Stretches   Active Hamstring Stretch  4 reps;30 seconds    Active Hamstring Stretch Limitations  2 for R and 2 left with strap  also explained sitting hamstring    Hip Flexor Stretch  4 reps;30 seconds    Hip Flexor Stretch Limitations  2 right and 2 left with opposite knee flexed for back    Piriformis Stretch  4 reps;30 seconds explained sitting cross leg in seat for stretch as well    Piriformis Stretch Limitations  2 left and 2 right              PT Education - 03/08/18 1222    Education provided  Yes    Education Details  Pt given HEP to go for piriformis, hip flexor and hamstring  stretch and variation    Person(s) Educated  Patient    Methods  Explanation;Demonstration;Verbal cues;Tactile cues;Handout    Comprehension  Verbalized understanding;Returned demonstration       PT Short Term Goals - 03/08/18 1259      PT SHORT TERM GOAL #1   Title  STG=LTG        PT Long Term Goals - 03/08/18 1302      PT LONG TERM GOAL #1   Title  "Pt will be independent with advanced HEP.     Time  6    Period  Weeks    Status  New    [place] Date  04/19/18      PT LONG TERM GOAL #2   Title  Pt will be able to shop for 1 hour without depending on shopping cart for mobility    Time  6    Period  Weeks    Status  New    [place] Date  04/19/18      PT LONG TERM GOAL #3   Title  "FOTO will improve from 49% limitation    to  43% limitation   indicating improved functional mobility     Time  6    Period  Weeks    Status  New    [place] Date  04/19/18      PT LONG TERM GOAL #4   Title  Pt will be able to walk for exercise for at least 20 minutes without exacerbating pain in bil hips    Time  6    Period  Weeks    Status  New    Target Date  04/19/18      PT LONG TERM GOAL #5   Title  "Pt will improve bil hip   strength to >/= 4+/5 with </= 2/10 pain to promote safety with walking/standing activities    Time  6    Period  Weeks    Status  New    Target Date  04/19/18             Plan - 03/08/18 1253    Clinical Impression Statement  Pt presents with 8 year history of bil hip pain.  Pt progressively worsening until 3 weeks ago when he recieved bil cortisone injections from Dr. Ninfa Linden.  Pt has a history of left THA, left TSR and  5 back surgeries.  Pt is limited to walking 400 feet and has 9/10 pain.  Pt does not experience severe pain with  sitting or standing for 35 minutes.  Pt  constantly  has pain with walking.  Pt  presents with weakness and decreased AROM and pain in bil hips.  Pt will benefit from  2 x a week for 6 weeks to address impairments and maximize function to return to walking for exercise and be able to walk without pain    History and Personal Factors relevant to plan of care:  See Medical History.  old THA left, left TSR and 5 back surgeries.  bil hip pain.     Clinical Presentation  Evolving    Clinical Decision Making  Moderate    Rehab Potential  Excellent       Patient will benefit from skilled therapeutic intervention in order to improve the following deficits and impairments:     Visit Diagnosis: Pain in right hip  Pain in left hip  Stiffness of left hip, not elsewhere classified  Stiffness of right hip, not elsewhere classified  Difficulty in walking, not elsewhere classified  Muscle weakness (generalized)     Problem List Patient Active Problem List   Diagnosis Date Noted  . Chest pain 02/07/2018  . Elevated troponin 03/23/2017  . Sigmoid diverticulitis 03/23/2017  . Acute pyelonephritis 03/23/2017  . AKI (acute kidney injury) ([place]) 03/23/2017  . Wrist pain, acute, right   . Acute systolic heart failure ([place])   . Hypothyroidism   . Rapid atrial fibrillation ([place])   . Dyspnea 02/26/2017  . Shoulder blade pain 02/26/2017  . SOB (shortness of  breath) 02/26/2017  . Chronic right shoulder pain   . Chronic systolic CHF (congestive heart failure) ([place])   . HLD (hyperlipidemia) 01/20/2017  . Chronic cholecystitis 07/27/2016  . PAD (peripheral artery disease) ([place]) 07/09/2015  . S/P CABG x 4 04/05/2015  . Coronary artery disease involving native coronary artery of native heart with angina pectoris ([place])   . Atypical chest pain 03/20/2015  . Carotid artery disease ([place]) 10/09/2014  . PVD (peripheral vascular disease) ([place]) 10/09/2014  . HTN (hypertension) 05/30/2014  . Arthritis of left hip 01/09/2014  . Status post THR (total hip replacement) 01/09/2014  . Spinal stenosis, lumbar 01/06/2013    Class: Diagnosis of  . Atherosclerosis of native artery of extremity with intermittent claudication ([place]) 10/18/2012   Voncille Lo, PT Certified Exercise Expert for the Aging Adult  03/08/18 1:08 PM Phone: [phone] Fax: [facility] [address] [place], [place], [address] Phone: [phone]   Fax:  [phone]  Name: LORENZO ARSCOTT MRN: [id number] Date of Birth: [birth year]

## 2018-03-08 NOTE — Patient Instructions (Addendum)
1)Leg Extension (Hamstring)   Sit toward front edge of chair, with leg out straight, heel on floor, toes pointing toward body. Keeping back straight, bend forward at hip, 30-60 second stretch Repeat _2-3__ times. Repeat with other leg. Do 2-3___ sessions per day.  OR  Hamstring Step 1   Straighten left knee. Keep knee level with other knee or on bolster. Hold _30-60__ seconds. Relax knee by returning foot to start. Repeat _2-3__ times.  Hamstring.  Right great toe over right shoulder,  IT band  Right great toe over left shoulder   Do other side as well.         2)Hip Stretch Piriformis  Put right ankle over left knee. Let right knee fall downward, but keep ankle in place. Feel the stretch in hip. May push down gently with hand to feel stretch. Hold _30___ seconds while counting out loud. Repeat with other leg. Repeat _2-3___ times. Do _2-3___ sessions per day.  OR  Stretching: Piriformis   OR this stretch Stretching: Piriformis (Supine)  Pull right knee toward opposite shoulder. Hold _30___ seconds. Relax. Repeat _2-3___ times per set. Do _1___ sets per session. Do __3__ sessions per day.      3 ))HIP: Flexors - Supine   Lie on edge of surface. Place leg off the surface, allow knee to bend. Bring other knee toward chest. Hold _30__ seconds. __3_ reps per set, _1__ sets per day, __7_ days per week Rest lowered foot on stool.  Voncille Lo, PT Certified Exercise Expert for the Aging Adult  03/08/18 12:22 PM Phone: [phone] Fax: [phone]

## 2018-03-08 NOTE — Telephone Encounter (Signed)
Patient's wife called to report that at last hospitalization in February, he did not get a stent placed because medical therapy was favored. Per her request, clarified with her what that means and how each of his heart medications affects his heart health.  Reiterated to her that taking Crestor will prevent further progression of CAD. She states the patient is not taking Crestor because it makes him so achy all over. She says he is allergic to Zetia and has "tried every statin under the sun." The only other statin listed in his medication history is atorvastatin.  The patient's wife understands they will be called with Dr. Elmarie Shiley medication recommendations.   The patient has a follow-up appointment scheduled with Dr. Acie Fredrickson 5/1.

## 2018-03-08 NOTE — Telephone Encounter (Signed)
I would like for Da to go to the lipid clinic and see if he is a candidate fo PCSK-9 inhibitor or the Clear study ( Bempidoic acid study)  Since he is not tolerant to statins or Zetia , this will be the best route

## 2018-03-08 NOTE — Telephone Encounter (Signed)
Letta Median (patient wife) calling,  States that she has couple of questions she would like to discuss.

## 2018-03-09 NOTE — Telephone Encounter (Signed)
Spoke with patient and his wife regarding lipid management. Wife states patient was scheduled to go to the hospital for a study but appointment got cancelled while he was hospitalized. Records show patient had an appointment with our research team. Patient states he cannot tolerate Rosuvastatin due to pain. He gets very confused about the names of his medications and he complains of multiple side effects with different medications. He also has complaints about pantoprazole but symptoms are vague and he talks about the same symptoms in regards to pantoprazole, rosuvastatin, and "that other medicine you had me on." Patient states he is supposed to be on a new medicine since he was in the hospital to help with his blockages and I explained that this is the cholesterol medication we have been talking about.  I will route message to lipid clinic for help with getting patient to the right location for management of his hyperlipidemia. Patient verbalized understanding that he should receive a call for a rescheduled appointment with our research team.

## 2018-03-10 NOTE — Telephone Encounter (Signed)
Per 2/7 phone note, pt canceled his screening appointment for CLEAR study and stated he would contact them if he was interested.  Will route again to research to see if pt is willing to come in for screening visit.

## 2018-03-10 NOTE — Telephone Encounter (Signed)
Spoke with Mrs. Pring today and she will call me back at the Research Department on Monday March 14, 2018 to schedule another appointment with [place].

## 2018-03-11 ENCOUNTER — Other Ambulatory Visit: Payer: Self-pay | Admitting: Internal Medicine

## 2018-03-11 DIAGNOSIS — R911 Solitary pulmonary nodule: Secondary | ICD-10-CM

## 2018-03-14 ENCOUNTER — Other Ambulatory Visit: Payer: Medicare Other

## 2018-03-15 ENCOUNTER — Ambulatory Visit (INDEPENDENT_AMBULATORY_CARE_PROVIDER_SITE_OTHER): Payer: Medicare Other | Admitting: Orthopaedic Surgery

## 2018-03-21 ENCOUNTER — Ambulatory Visit
Admission: RE | Admit: 2018-03-21 | Discharge: 2018-03-21 | Disposition: A | Discharge status: Home / Self Care | Payer: Medicare Other | Source: Ambulatory Visit | Attending: Internal Medicine | Admitting: Internal Medicine

## 2018-03-21 DIAGNOSIS — R911 Solitary pulmonary nodule: Secondary | ICD-10-CM | POA: Diagnosis not present

## 2018-03-23 ENCOUNTER — Ambulatory Visit: Payer: Medicare Other | Attending: Physician Assistant | Admitting: Physical Therapy

## 2018-03-23 ENCOUNTER — Encounter: Payer: Self-pay | Admitting: Physical Therapy

## 2018-03-23 DIAGNOSIS — M25552 Pain in left hip: Secondary | ICD-10-CM | POA: Insufficient documentation

## 2018-03-23 DIAGNOSIS — M6281 Muscle weakness (generalized): Secondary | ICD-10-CM | POA: Diagnosis not present

## 2018-03-23 DIAGNOSIS — M25652 Stiffness of left hip, not elsewhere classified: Secondary | ICD-10-CM | POA: Insufficient documentation

## 2018-03-23 DIAGNOSIS — M25551 Pain in right hip: Secondary | ICD-10-CM | POA: Diagnosis not present

## 2018-03-23 DIAGNOSIS — R262 Difficulty in walking, not elsewhere classified: Secondary | ICD-10-CM | POA: Diagnosis not present

## 2018-03-23 DIAGNOSIS — M25651 Stiffness of right hip, not elsewhere classified: Secondary | ICD-10-CM | POA: Diagnosis not present

## 2018-03-23 NOTE — Therapy (Signed)
Dictation #1 [id number]  [place], [place], [address] Phone: [phone]   Fax:  [phone]  Physical Therapy Treatment  Patient Details  Name: Ryan [name] MRN: [id number] Date of Birth: 23-Feb-1941 Referring Provider: Zollie Beckers, MD   Encounter Date: 03/23/2018  PT End of Session - 03/23/18 0849    Visit Number  2    Number of Visits  13    Date for PT Re-Evaluation  04/19/18    Authorization Type  Medicare    PT Start Time  0800    PT Stop Time  0845    PT Time Calculation (min)  45 min       Past Medical History:  Diagnosis Date  . Arthritis   . CAD (coronary artery disease)   . Carotid artery occlusion   . Cataract    Bil eyes/worse in left eye  . CHF (congestive heart failure) ([place])   . Chronic back pain   . DVT (deep venous thrombosis) ([place])   . Dysrhythmia   . Enlarged prostate    takes Rapaflo daily  . GERD (gastroesophageal reflux disease)    occasional  . History of colon polyps   . History of gout    has colchicine prn  . History of kidney stones   . Hyperlipidemia    takes Crestor daily  . Hypertension    takes Amlodipine daily  . Hypothyroidism   . Myocardial infarction ([place])   . Peripheral vascular disease ([place])   . Pulmonary emboli ([place]) 03/20/2015   elevated d-dimer, intermediate V/Q study, atypical chest pain and SOB. Start on Xarelto 20mg  BID for 3 month  . Renal insufficiency   . Shortness of breath dyspnea   . Urinary frequency   . Urinary urgency     Past Surgical History:  Procedure Laterality Date  . APPENDECTOMY    . BACK SURGERY     5 times  . big toe surgery    . CARDIAC CATHETERIZATION     2010    dr Acie Fredrickson  . cataract surgery     left eye  . CHOLECYSTECTOMY N/A 07/27/2016   Procedure: LAPAROSCOPIC CHOLECYSTECTOMY;  Surgeon: Mickeal Skinner, MD;  Location: [facility];  Service: General;  Laterality: N/A;  . COLONOSCOPY     . CORONARY ARTERY BYPASS GRAFT N/A 04/05/2015   Procedure: CORONARY ARTERY BYPASS GRAFTING (CABG)X4 LIMA-LAD; SVG-DIAG1-DIAG2; SVG-PD;  Surgeon: [name], MD;  Location: [facility];  Service: Open Heart Surgery;  Laterality: N/A;  . CYSTOSCOPY    . ENDARTERECTOMY Left 04/24/2016   Procedure: ENDARTERECTOMY LEFT CAROTID;  Surgeon: Mal Misty, MD;  Location: [facility];  Service: Vascular;  Laterality: Left;  . EYE SURGERY    . FEMORAL ARTERY - POPLITEAL ARTERY BYPASS GRAFT    . JOINT REPLACEMENT     shoulder  . LEFT HEART CATHETERIZATION WITH CORONARY ANGIOGRAM N/A 04/03/2015   Procedure: LEFT HEART CATHETERIZATION WITH CORONARY ANGIOGRAM;  Surgeon: Troy Sine, MD;  Location[facility] LAB;  Service: Cardiovascular;  Laterality: N/A;  . LUMBAR LAMINECTOMY  01/06/2013   Procedure: MICRODISCECTOMY LUMBAR LAMINECTOMY;  Surgeon: Marybelle Killings, MD;  Location: [place];  Service: Orthopedics;  Laterality: N/A;  L3-4 decompression  . LUMBAR LAMINECTOMY/DECOMPRESSION MICRODISCECTOMY  02/12/2012   Procedure: LUMBAR LAMINECTOMY/DECOMPRESSION MICRODISCECTOMY;  Surgeon: Floyce Stakes, MD;  Location: [place] NEURO ORS;  Service: Neurosurgery;  Laterality: N/A;  Lumbar four-five laminectomy  . PATCH ANGIOPLASTY  Left 04/24/2016   Procedure: LEFT CAROTID ARTERY PATCH ANGIOPLASTY;  Surgeon: Mal Misty, MD;  Location: [place];  Service: Vascular;  Laterality: Left;  . STERIOD INJECTION Right 01/09/2014   Procedure: STEROID INJECTION;  Surgeon: Mcarthur Rossetti, MD;  Location: [place];  Service: Orthopedics;  Laterality: Right;  . TEE WITHOUT CARDIOVERSION N/A 04/05/2015   Procedure: TRANSESOPHAGEAL ECHOCARDIOGRAM (TEE);  Surgeon: [name], MD;  Location: [facility];  Service: Open Heart Surgery;  Laterality: N/A;  . TOTAL HIP ARTHROPLASTY Left 01/09/2014   DR Ninfa Linden  . TOTAL HIP ARTHROPLASTY Left 01/09/2014   Procedure: LEFT TOTAL HIP ARTHROPLASTY ANTERIOR APPROACH and Steroid Injection Right hip;   Surgeon: Mcarthur Rossetti, MD;  Location: [place];  Service: Orthopedics;  Laterality: Left;    There were no vitals filed for this visit.  Subjective Assessment - 03/23/18 0848    Currently in Pain?  No/denies                       [facility] Adult PT Treatment/Exercise - 03/23/18 0001      Knee/Hip Exercises: Stretches   Active Hamstring Stretch Limitations  sitting with strap 3 x 30     Hip Flexor Stretch  1 rep;60 seconds    Piriformis Stretch  3 reps;30 seconds    Other Knee/Hip Stretches  figure 4 3 x 30 each       Knee/Hip Exercises: Aerobic   Nustep  L4 LE only x 1 minute  c/o  "giving out"      Knee/Hip Exercises: Seated   Sit to Sand  10 reps;without UE support      Knee/Hip Exercises: Supine   Bridges  2 sets;10 reps    Straight Leg Raises  Both;2 sets;10 reps    Other Supine Knee/Hip Exercises  red band 10 reps 2 sets              PT Education - 03/23/18 0925    Education provided  Yes    Education Details  HEP    Person(s) Educated  Patient    Methods  Explanation;Handout    Comprehension  Verbalized understanding       PT Short Term Goals - 03/08/18 1259      PT SHORT TERM GOAL #1   Title  STG=LTG        PT Long Term Goals - 03/08/18 1302      PT LONG TERM GOAL #1   Title  "Pt will be independent with advanced HEP.     Time  6    Period  Weeks    Status  New    [place] Date  04/19/18      PT LONG TERM GOAL #2   Title  Pt will be able to shop for 1 hour without depending on shopping cart for mobility    Time  6    Period  Weeks    Status  New    Target Date  04/19/18      PT LONG TERM GOAL #3   Title  "FOTO will improve from 49% limitation    to  43% limitation   indicating improved functional mobility     Time  6    Period  Weeks    Status  New    Target Date  04/19/18      PT LONG TERM GOAL #4   Title  Pt will be able to walk for exercise for  at least 20 minutes without exacerbating pain in bil hips    Time  6     Period  Weeks    Status  New    Target Date  04/19/18      PT LONG TERM GOAL #5   Title  "Pt will improve bil hip  strength to >/= 4+/5 with </= 2/10 pain to promote safety with walking/standing activities    Time  6    Period  Weeks    Status  New    Target Date  04/19/18            Plan - 03/23/18 0959    Clinical Impression Statement  Reviewed HEP and updated with hip strengthening. No increased pain. On Nustep, he only tolerated 1 minute prior to leg fatigue.     PT Next Visit Plan  Review HEP, progress strength as able, core        Patient will benefit from skilled therapeutic intervention in order to improve the following deficits and impairments:     Visit Diagnosis: Pain in left hip  Pain in right hip  Stiffness of left hip, not elsewhere classified  Stiffness of right hip, not elsewhere classified  Difficulty in walking, not elsewhere classified  Muscle weakness (generalized)     Problem List Patient Active Problem List   Diagnosis Date Noted  . Chest pain 02/07/2018  . Elevated troponin 03/23/2017  . Sigmoid diverticulitis 03/23/2017  . Acute pyelonephritis 03/23/2017  . AKI (acute kidney injury) ([place]) 03/23/2017  . Wrist pain, acute, right   . Acute systolic heart failure ([place])   . Hypothyroidism   . Rapid atrial fibrillation ([place])   . Dyspnea 02/26/2017  . Shoulder blade pain 02/26/2017  . SOB (shortness of breath) 02/26/2017  . Chronic right shoulder pain   . Chronic systolic CHF (congestive heart failure) ([place])   . HLD (hyperlipidemia) 01/20/2017  . Chronic cholecystitis 07/27/2016  . PAD (peripheral artery disease) ([place]) 07/09/2015  . S/P CABG x 4 04/05/2015  . Coronary artery disease involving native coronary artery of native heart with angina pectoris ([place])   . Atypical chest pain 03/20/2015  . Carotid artery disease ([place]) 10/09/2014  . PVD (peripheral vascular disease) ([place]) 10/09/2014  . HTN (hypertension) 05/30/2014  .  Arthritis of left hip 01/09/2014  . Status post THR (total hip replacement) 01/09/2014  . Spinal stenosis, lumbar 01/06/2013    Class: Diagnosis of  . Atherosclerosis of native artery of extremity with intermittent claudication ([place]) 10/18/2012    Dorene Ar, PTA 03/23/2018, 10:04 AM  [place], [place], [address] Phone: [phone]   Fax:  [phone]  Name: Ryan [name] MRN: [id number] Date of Birth: 10-26-1941

## 2018-03-24 ENCOUNTER — Ambulatory Visit (INDEPENDENT_AMBULATORY_CARE_PROVIDER_SITE_OTHER): Payer: Medicare Other | Admitting: Gastroenterology

## 2018-03-24 ENCOUNTER — Encounter: Payer: Self-pay | Admitting: Gastroenterology

## 2018-03-24 ENCOUNTER — Telehealth: Payer: Self-pay | Admitting: Gastroenterology

## 2018-03-24 VITALS — BP 120/54 | HR 56 | Ht 66.0 in | Wt 174.6 lb

## 2018-03-24 DIAGNOSIS — K5900 Constipation, unspecified: Secondary | ICD-10-CM | POA: Diagnosis not present

## 2018-03-24 DIAGNOSIS — Z86010 Personal history of colonic polyps: Secondary | ICD-10-CM | POA: Diagnosis not present

## 2018-03-24 DIAGNOSIS — R195 Other fecal abnormalities: Secondary | ICD-10-CM

## 2018-03-24 MED ORDER — SUPREP BOWEL PREP KIT 17.5-3.13-1.6 GM/177ML PO SOLN: 1.0000 | ORAL | 0 refills | Status: DC

## 2018-03-24 NOTE — Telephone Encounter (Signed)
[facility] Pre-operative Risk Assessment     Request for surgical clearance:     Endoscopy Procedure  What type of surgery is being performed?     colonoscopy  When is this surgery scheduled?     04/22/18  What type of clearance is required ?   Pharmacy  Are there any medications that need to be held prior to surgery and how long? 2 days  Practice name and name of physician performing surgery?      [facility]  What is your office phone and fax number?      Phone- [phone]  [phone]  Anesthesia type (None, local, MAC, general) ?       MAC

## 2018-03-24 NOTE — Patient Instructions (Signed)
Normal BMI (Body Mass Index- based on height and weight) is between 23 and 30. Your BMI today is Body mass index is 28.18 kg/m. Marland Kitchen Please consider follow up  regarding your BMI with your Primary Care Provider.  You have been scheduled for a colonoscopy. Please follow written instructions given to you at your visit today.  Please pick up your prep supplies at the pharmacy within the next 1-3 days. If you use inhalers (even only as needed), please bring them with you on the day of your procedure. Your physician has requested that you go to www.startemmi.com and enter the access code given to you at your visit today. This web site gives a general overview about your procedure. However, you should still follow specific instructions given to you by our office regarding your preparation for the procedure.  Please purchase the following medications over the counter and take as directed: Benifiber1 tablespoon2-3 times daily with meals

## 2018-03-24 NOTE — Progress Notes (Signed)
Ryan [name]    [id number]    Nov 05, 1941  Primary Care Physician:Shaw, Gwyndolyn Saxon, MD  Referring Physician: Marton Redwood, MD [address] [place], [place] [address]  Chief complaint:  Heme positive stool HPI: 77 year old male with history of CAD status post CABG, hypertension, hyperlipidemia, A. fib on chronic anticoagulation (Eliquis) here for evaluation of heme positive stool.  Hemoglobin 14.6.  Patient denies any melena or blood per rectum.  He has occasional nausea otherwise denies any vomiting, dysphagia, odynophagia,  abdominal pain or change in bowel habits.  He has occasional constipation and has to strain excessively otherwise he is doing well overall and has no specific GI complaints.  Colonoscopy October 2014 by Dr. Deatra Ina: 2 mm sessile polyp, tubular adenoma was removed, mild diverticulosis and internal hemorrhoids  Outpatient Encounter Medications as of 03/24/2018  Medication Sig  . amiodarone (PACERONE) 200 MG tablet Take 1 tablet (200 mg total) by mouth at bedtime. Take this at night instead of morning  . apixaban (ELIQUIS) 5 MG TABS tablet Take 1 tablet (5 mg total) by mouth 2 (two) times daily.  . bisoprolol (ZEBETA) 5 MG tablet Take 0.5 tablets (2.5 mg total) by mouth daily.  . furosemide (LASIX) 20 MG tablet Take 1 tablet (20 mg total) by mouth daily.  Marland Kitchen HYDROcodone-acetaminophen (NORCO/VICODIN) 5-325 MG tablet Take 1 tablet by mouth every 12 (twelve) hours as needed for moderate pain or severe pain (pain).  Marland Kitchen levothyroxine (UNITHROID) 50 MCG tablet Take 50 mcg by mouth daily before breakfast. "Unithroid"  . meclizine (ANTIVERT) 25 MG tablet Take 25 mg by mouth 3 (three) times daily as needed for dizziness.   . nitroGLYCERIN (NITROSTAT) 0.4 MG SL tablet Place 1 tablet (0.4 mg total) under the tongue every 5 (five) minutes as needed for chest pain (CP or SOB).  . pantoprazole (PROTONIX) 40 MG tablet Take 1 tablet (40 mg total) by mouth daily.  . potassium  chloride SA (K-DUR,KLOR-CON) 20 MEQ tablet Take 2 tablets (40 mEq total) by mouth daily. (Patient taking differently: Take 20 mEq by mouth daily. )  . senna (SENOKOT) 8.6 MG TABS tablet Take 1 tablet (8.6 mg total) by mouth daily.  . tamsulosin (FLOMAX) 0.4 MG CAPS capsule Take 0.4 mg by mouth at bedtime.   No facility-administered encounter medications on file as of 03/24/2018.     Allergies as of 03/24/2018 - Review Complete 03/23/2018  Allergen Reaction Noted  . Codeine Nausea And Vomiting 07/24/2008  . Zetia [ezetimibe] Other (See Comments) 04/07/2016    Past Medical History:  Diagnosis Date  . Arthritis   . CAD (coronary artery disease)   . Carotid artery occlusion   . Cataract    Bil eyes/worse in left eye  . CHF (congestive heart failure) ([place])   . Chronic back pain   . DVT (deep venous thrombosis) ([place])   . Dysrhythmia   . Enlarged prostate    takes Rapaflo daily  . GERD (gastroesophageal reflux disease)    occasional  . History of colon polyps   . History of gout    has colchicine prn  . History of kidney stones   . Hyperlipidemia    takes Crestor daily  . Hypertension    takes Amlodipine daily  . Hypothyroidism   . Myocardial infarction ([place])   . Peripheral vascular disease ([place])   . Pulmonary emboli ([place]) 03/20/2015   elevated d-dimer, intermediate V/Q study, atypical chest pain and SOB.  Start on Xarelto 20mg  BID for 3 month  . Renal insufficiency   . Shortness of breath dyspnea   . Urinary frequency   . Urinary urgency     Past Surgical History:  Procedure Laterality Date  . APPENDECTOMY    . BACK SURGERY     5 times  . big toe surgery    . CARDIAC CATHETERIZATION     2010    dr Acie Fredrickson  . cataract surgery     left eye  . CHOLECYSTECTOMY N/A 07/27/2016   Procedure: LAPAROSCOPIC CHOLECYSTECTOMY;  Surgeon: Mickeal Skinner, MD;  Location: [place];  Service: General;  Laterality: N/A;  . COLONOSCOPY    . CORONARY ARTERY BYPASS GRAFT N/A 04/05/2015    Procedure: CORONARY ARTERY BYPASS GRAFTING (CABG)X4 LIMA-LAD; SVG-DIAG1-DIAG2; SVG-PD;  Surgeon: [name], MD;  Location: [place];  Service: Open Heart Surgery;  Laterality: N/A;  . CYSTOSCOPY    . ENDARTERECTOMY Left 04/24/2016   Procedure: ENDARTERECTOMY LEFT CAROTID;  Surgeon: Mal Misty, MD;  Location: [facility];  Service: Vascular;  Laterality: Left;  . EYE SURGERY    . FEMORAL ARTERY - POPLITEAL ARTERY BYPASS GRAFT    . JOINT REPLACEMENT     shoulder  . LEFT HEART CATHETERIZATION WITH CORONARY ANGIOGRAM N/A 04/03/2015   Procedure: LEFT HEART CATHETERIZATION WITH CORONARY ANGIOGRAM;  Surgeon: Troy Sine, MD;  Location: [facility] CATH LAB;  Service: Cardiovascular;  Laterality: N/A;  . LUMBAR LAMINECTOMY  01/06/2013   Procedure: MICRODISCECTOMY LUMBAR LAMINECTOMY;  Surgeon: Marybelle Killings, MD;  Location: [facility];  Service: Orthopedics;  Laterality: N/A;  L3-4 decompression  . LUMBAR LAMINECTOMY/DECOMPRESSION MICRODISCECTOMY  02/12/2012   Procedure: LUMBAR LAMINECTOMY/DECOMPRESSION MICRODISCECTOMY;  Surgeon: Floyce Stakes, MD;  Location: [place] NEURO ORS;  Service: Neurosurgery;  Laterality: N/A;  Lumbar four-five laminectomy  . PATCH ANGIOPLASTY Left 04/24/2016   Procedure: LEFT CAROTID ARTERY PATCH ANGIOPLASTY;  Surgeon: Mal Misty, MD;  Location: [place];  Service: Vascular;  Laterality: Left;  . STERIOD INJECTION Right 01/09/2014   Procedure: STEROID INJECTION;  Surgeon: Mcarthur Rossetti, MD;  Location: [place];  Service: Orthopedics;  Laterality: Right;  . TEE WITHOUT CARDIOVERSION N/A 04/05/2015   Procedure: TRANSESOPHAGEAL ECHOCARDIOGRAM (TEE);  Surgeon: [name], MD;  Location: [place];  Service: Open Heart Surgery;  Laterality: N/A;  . TOTAL HIP ARTHROPLASTY Left 01/09/2014   DR Ninfa Linden  . TOTAL HIP ARTHROPLASTY Left 01/09/2014   Procedure: LEFT TOTAL HIP ARTHROPLASTY ANTERIOR APPROACH and Steroid Injection Right hip;  Surgeon: Mcarthur Rossetti, MD;  Location: [place];   Service: Orthopedics;  Laterality: Left;    Family History  Problem Relation Age of Onset  . Heart disease Father   . Heart attack Father   . Heart disease Sister   . Hypertension Sister   . Heart attack Sister   . Hypertension Mother   . Diabetes Son     Social History   Socioeconomic History  . Marital status: Married    Spouse name: Not on file  . Number of children: Not on file  . Years of education: Not on file  . Highest education level: Not on file  Occupational History  . Not on file  Social Needs  . Financial resource strain: Not on file  . Food insecurity:    Worry: Not on file    Inability: Not on file  . Transportation needs:    Medical: Not on file    Non-medical: Not  on file  Tobacco Use  . Smoking status: Former Smoker    Types: Cigarettes    Last attempt to quit: 02/04/1987    Years since quitting: 31.1  . Smokeless tobacco: Former Systems developer    Types: Chew    Quit date: 07/20/2009  . Tobacco comment: quit 35+yrs ago  Substance and Sexual Activity  . Alcohol use: No    Alcohol/week: 0.0 oz  . Drug use: No  . Sexual activity: Not Currently  Lifestyle  . Physical activity:    Days per week: Not on file    Minutes per session: Not on file  . Stress: Not on file  Relationships  . Social connections:    Talks on phone: Not on file    Gets together: Not on file    Attends religious service: Not on file    Active member of club or organization: Not on file    Attends meetings of clubs or organizations: Not on file    Relationship status: Not on file  . Intimate partner violence:    Fear of current or ex partner: Not on file    Emotionally abused: Not on file    Physically abused: Not on file    Forced sexual activity: Not on file  Other Topics Concern  . Not on file  Social History Narrative  . Not on file      Review of systems: Review of Systems  Constitutional: Negative for fever and chills.  HENT: Negative.   Eyes: Negative for blurred  vision.  Respiratory: Negative for cough, shortness of breath and wheezing.   Cardiovascular: Negative for chest pain and palpitations.  Gastrointestinal: as per HPI Genitourinary: Negative for dysuria, urgency, frequency and hematuria.  Musculoskeletal: Negative for myalgias, back pain and joint pain.  Skin: Negative for itching and rash.  Neurological: Negative for dizziness, tremors, focal weakness, seizures and loss of consciousness.  Endo/Heme/Allergies: Positive for seasonal allergies.  Psychiatric/Behavioral: Negative for depression, suicidal ideas and hallucinations.  All other systems reviewed and are negative.   Physical Exam: Vitals:   03/24/18 1043  BP: (!) 120/54  Pulse: (!) 56   Body mass index is 28.18 kg/m. Gen:      No acute distress HEENT:  EOMI, sclera anicteric Neck:     No masses; no thyromegaly Lungs:    Clear to auscultation bilaterally; normal respiratory effort CV:         Regular rate and rhythm; no murmurs Abd:      + bowel sounds; soft, non-tender; no palpable masses, no distension Ext:    No edema; adequate peripheral perfusion Skin:      Warm and dry; no rash Neuro: alert and oriented x 3 Psych: normal mood and affect  Data Reviewed:  Reviewed labs, radiology imaging, old records and pertinent past GI work up   Assessment and Plan/Recommendations:  77 year old male history of CAD status post CABG, A. fib on Eliquis here for evaluation of heme positive stool. He has no upper GI symptoms Last colonoscopy 2014 with removal of tubular adenoma Due for surveillance colonoscopy, will proceed with scheduling The risks and benefits as well as alternatives of endoscopic procedure(s) have been discussed and reviewed. All questions answered. The patient agrees to proceed.  Constipation: Advised patient to increase dietary fluid and fiber intake Plan number 1 tablespoon 2-3 times daily with Return after the procedure as needed  K. Denzil Magnuson ,  MD [phone]    CC: Marton Redwood, MD

## 2018-03-27 NOTE — Telephone Encounter (Signed)
Patient with diagnosis of atrial fibrillation on Eliquis for anticoagulation.    Procedure: colonoscopy Date of procedure: 04/22/18  CHADS2-VASc score of  6 (CHF, HTN, AGE, DM2,, CAD, AGE, )  CrCl 51 Platelet count 229  Per office protocol, patient can hold Eliquis for 2 days prior to procedure.    Patient will not need bridging with Lovenox (enoxaparin) around procedure.  Patient should restart Eliquis on the evening of procedure or day after, at discretion of procedure MD

## 2018-03-28 ENCOUNTER — Ambulatory Visit: Payer: Medicare Other | Admitting: Physical Therapy

## 2018-03-28 ENCOUNTER — Encounter: Payer: Self-pay | Admitting: Physical Therapy

## 2018-03-28 DIAGNOSIS — M25552 Pain in left hip: Secondary | ICD-10-CM | POA: Diagnosis not present

## 2018-03-28 DIAGNOSIS — M25652 Stiffness of left hip, not elsewhere classified: Secondary | ICD-10-CM

## 2018-03-28 DIAGNOSIS — M6281 Muscle weakness (generalized): Secondary | ICD-10-CM

## 2018-03-28 DIAGNOSIS — M25651 Stiffness of right hip, not elsewhere classified: Secondary | ICD-10-CM | POA: Diagnosis not present

## 2018-03-28 DIAGNOSIS — M25551 Pain in right hip: Secondary | ICD-10-CM

## 2018-03-28 DIAGNOSIS — R262 Difficulty in walking, not elsewhere classified: Secondary | ICD-10-CM | POA: Diagnosis not present

## 2018-03-28 NOTE — Therapy (Signed)
[place], [place], [address] Phone: [phone]   Fax:  [phone]  Physical Therapy Treatment  Patient Details  Name: [facility] [name] MRN: [id number] Date of Birth: Jul 26, 1941 Referring Provider: Zollie Beckers, MD   Encounter Date: 03/28/2018  PT End of Session - 03/28/18 0932    Visit Number  3    Number of Visits  13    Date for PT Re-Evaluation  04/19/18    Authorization Type  Medicare    PT Start Time  0930    PT Stop Time  1008    PT Time Calculation (min)  38 min       Past Medical History:  Diagnosis Date  . Arthritis   . CAD (coronary artery disease)   . Carotid artery occlusion   . Cataract    Bil eyes/worse in left eye  . CHF (congestive heart failure) ([place])   . Chronic back pain   . DVT (deep venous thrombosis) ([place])   . Dysrhythmia   . Enlarged prostate    takes Rapaflo daily  . GERD (gastroesophageal reflux disease)    occasional  . History of colon polyps   . History of gout    has colchicine prn  . History of kidney stones   . Hyperlipidemia    takes Crestor daily  . Hypertension    takes Amlodipine daily  . Hypothyroidism   . Myocardial infarction ([place])   . Peripheral vascular disease ([place])   . Pulmonary emboli ([place]) 03/20/2015   elevated d-dimer, intermediate V/Q study, atypical chest pain and SOB. Start on Xarelto 20mg  BID for 3 month  . Renal insufficiency   . Shortness of breath dyspnea   . Urinary frequency   . Urinary urgency     Past Surgical History:  Procedure Laterality Date  . APPENDECTOMY    . BACK SURGERY     5 times  . big toe surgery    . CARDIAC CATHETERIZATION     2010    dr Acie Fredrickson  . cataract surgery     left eye  . CHOLECYSTECTOMY N/A 07/27/2016   Procedure: LAPAROSCOPIC CHOLECYSTECTOMY;  Surgeon: Mickeal Skinner, MD;  Location: [facility];  Service: General;  Laterality: N/A;  . COLONOSCOPY    . CORONARY ARTERY BYPASS GRAFT N/A  04/05/2015   Procedure: CORONARY ARTERY BYPASS GRAFTING (CABG)X4 LIMA-LAD; SVG-DIAG1-DIAG2; SVG-PD;  Surgeon: [name], MD;  Location: [facility];  Service: Open Heart Surgery;  Laterality: N/A;  . CYSTOSCOPY    . ENDARTERECTOMY Left 04/24/2016   Procedure: ENDARTERECTOMY LEFT CAROTID;  Surgeon: Mal Misty, MD;  Location: [facility];  Service: Vascular;  Laterality: Left;  . EYE SURGERY    . FEMORAL ARTERY - POPLITEAL ARTERY BYPASS GRAFT    . JOINT REPLACEMENT     shoulder  . LEFT HEART CATHETERIZATION WITH CORONARY ANGIOGRAM N/A 04/03/2015   Procedure: LEFT HEART CATHETERIZATION WITH CORONARY ANGIOGRAM;  Surgeon: Troy Sine, MD;  Location: [facility] CATH LAB;  Service: Cardiovascular;  Laterality: N/A;  . LUMBAR LAMINECTOMY  01/06/2013   Procedure: MICRODISCECTOMY LUMBAR LAMINECTOMY;  Surgeon: Marybelle Killings, MD;  Location: [facility];  Service: Orthopedics;  Laterality: N/A;  L3-4 decompression  . LUMBAR LAMINECTOMY/DECOMPRESSION MICRODISCECTOMY  02/12/2012   Procedure: LUMBAR LAMINECTOMY/DECOMPRESSION MICRODISCECTOMY;  Surgeon: Floyce Stakes, MD;  Location: [facility];  Service: Neurosurgery;  Laterality: N/A;  Lumbar four-five laminectomy  . PATCH ANGIOPLASTY Left 04/24/2016   Procedure:  LEFT CAROTID ARTERY PATCH ANGIOPLASTY;  Surgeon: Mal Misty, MD;  Location: [facility];  Service: Vascular;  Laterality: Left;  . STERIOD INJECTION Right 01/09/2014   Procedure: STEROID INJECTION;  Surgeon: Mcarthur Rossetti, MD;  Location: [facility];  Service: Orthopedics;  Laterality: Right;  . TEE WITHOUT CARDIOVERSION N/A 04/05/2015   Procedure: TRANSESOPHAGEAL ECHOCARDIOGRAM (TEE);  Surgeon: [name], MD;  Location: [facility];  Service: Open Heart Surgery;  Laterality: N/A;  . TOTAL HIP ARTHROPLASTY Left 01/09/2014   DR Ninfa Linden  . TOTAL HIP ARTHROPLASTY Left 01/09/2014   Procedure: LEFT TOTAL HIP ARTHROPLASTY ANTERIOR APPROACH and Steroid Injection Right hip;  Surgeon: Mcarthur Rossetti, MD;   Location: [facility];  Service: Orthopedics;  Laterality: Left;    There were no vitals filed for this visit.  Subjective Assessment - 03/28/18 0951    Currently in Pain?  No/denies    Pain Score  -- up to 8/10 with walking                        [facility] Adult PT Treatment/Exercise - 03/28/18 0001      Knee/Hip Exercises: Stretches   Active Hamstring Stretch  3 reps;30 seconds supine with strap    Hip Flexor Stretch  1 rep;60 seconds    ITB Stretch  2 reps;20 seconds strap    Piriformis Stretch  3 reps;30 seconds    Other Knee/Hip Stretches  figure 4 3 x 30 each       Knee/Hip Exercises: Supine   Bridges  2 sets;10 reps    Bridges with Clamshell  10 reps red    Straight Leg Raises  Both;2 sets;10 reps    Other Supine Knee/Hip Exercises  red band 10 reps 2 sets       Knee/Hip Exercises: Sidelying   Hip ABduction  2 sets;10 reps    Clams  10x 2 red              PT Education - 03/28/18 1006    Education provided  Yes    Education Details  HEP    Person(s) Educated  Patient    Methods  Explanation;Handout    Comprehension  Verbalized understanding       PT Short Term Goals - 03/08/18 1259      PT SHORT TERM GOAL #1   Title  STG=LTG        PT Long Term Goals - 03/08/18 1302      PT LONG TERM GOAL #1   Title  "Pt will be independent with advanced HEP.     Time  6    Period  Weeks    Status  New    Target Date  04/19/18      PT LONG TERM GOAL #2   Title  Pt will be able to shop for 1 hour without depending on shopping cart for mobility    Time  6    Period  Weeks    Status  New    Target Date  04/19/18      PT LONG TERM GOAL #3   Title  "FOTO will improve from 49% limitation    to  43% limitation   indicating improved functional mobility     Time  6    Period  Weeks    Status  New    Target Date  04/19/18      PT LONG TERM GOAL #4   Title  Pt will be able to walk for exercise for at least 20 minutes without exacerbating pain in bil hips     Time  6    Period  Weeks    Status  New    Target Date  04/19/18      PT LONG TERM GOAL #5   Title  "Pt will improve bil hip  strength to >/= 4+/5 with </= 2/10 pain to promote safety with walking/standing activities    Time  6    Period  Weeks    Status  New    Target Date  04/19/18            Plan - 03/28/18 0933    Clinical Impression Statement  Pt arrives reporting dizziness. He takes medication that makes him dizzy and he forgot to take the medication for the dizziness. Pt reports he notes improved tolerance to stretches. He feels his pain is about the same with good days and bad days. Updated HEP with ITB stretch and hip abduction. No increased pain. Good stretch felt with ITB stretch.     PT Next Visit Plan  Review HEP, progress strength as able, core , is he doing clams at home-do not see it in HEP-he says he is    PT Home Exercise Plan  Hip flexor stretch, piriformis, figure 4 , hamstring stretch, ITB with strap, hip abduction,        Patient will benefit from skilled therapeutic intervention in order to improve the following deficits and impairments:     Visit Diagnosis: Pain in left hip  Pain in right hip  Stiffness of left hip, not elsewhere classified  Stiffness of right hip, not elsewhere classified  Difficulty in walking, not elsewhere classified  Muscle weakness (generalized)     Problem List Patient Active Problem List   Diagnosis Date Noted  . Chest pain 02/07/2018  . Elevated troponin 03/23/2017  . Sigmoid diverticulitis 03/23/2017  . Acute pyelonephritis 03/23/2017  . AKI (acute kidney injury) ([place]) 03/23/2017  . Wrist pain, acute, right   . Acute systolic heart failure ([place])   . Hypothyroidism   . Rapid atrial fibrillation ([place])   . Dyspnea 02/26/2017  . Shoulder blade pain 02/26/2017  . SOB (shortness of breath) 02/26/2017  . Chronic right shoulder pain   . Chronic systolic CHF (congestive heart failure) ([place])   . HLD  (hyperlipidemia) 01/20/2017  . Chronic cholecystitis 07/27/2016  . PAD (peripheral artery disease) ([place]) 07/09/2015  . S/P CABG x 4 04/05/2015  . Coronary artery disease involving native coronary artery of native heart with angina pectoris ([place])   . Atypical chest pain 03/20/2015  . Carotid artery disease ([place]) 10/09/2014  . PVD (peripheral vascular disease) ([place]) 10/09/2014  . HTN (hypertension) 05/30/2014  . Arthritis of left hip 01/09/2014  . Status post THR (total hip replacement) 01/09/2014  . Spinal stenosis, lumbar 01/06/2013    Class: Diagnosis of  . Atherosclerosis of native artery of extremity with intermittent claudication ([place]) 10/18/2012    Dorene Ar, PTA 03/28/2018, 10:09 AM  [place], [place], [address] Phone: [phone]   Fax:  [phone]  Name: JERAY SHUGART MRN: [id number] Date of Birth: 04/10/1941

## 2018-03-28 NOTE — Telephone Encounter (Signed)
Spoke to patients wife and informed her that patient has been approved to stop his Eliquis for 2 days prior to his 04/21/18 endo/colon. She voiced understanding and will tell patient.

## 2018-03-30 ENCOUNTER — Encounter: Payer: Self-pay | Admitting: Physical Therapy

## 2018-03-30 ENCOUNTER — Ambulatory Visit: Payer: Medicare Other | Admitting: Physical Therapy

## 2018-03-30 ENCOUNTER — Other Ambulatory Visit: Payer: Self-pay | Admitting: Cardiovascular Disease

## 2018-03-30 ENCOUNTER — Encounter: Payer: Self-pay | Admitting: Gastroenterology

## 2018-03-30 DIAGNOSIS — M25652 Stiffness of left hip, not elsewhere classified: Secondary | ICD-10-CM | POA: Diagnosis not present

## 2018-03-30 DIAGNOSIS — M25552 Pain in left hip: Secondary | ICD-10-CM | POA: Diagnosis not present

## 2018-03-30 DIAGNOSIS — M25651 Stiffness of right hip, not elsewhere classified: Secondary | ICD-10-CM | POA: Diagnosis not present

## 2018-03-30 DIAGNOSIS — M6281 Muscle weakness (generalized): Secondary | ICD-10-CM | POA: Diagnosis not present

## 2018-03-30 DIAGNOSIS — R262 Difficulty in walking, not elsewhere classified: Secondary | ICD-10-CM | POA: Diagnosis not present

## 2018-03-30 DIAGNOSIS — M25551 Pain in right hip: Secondary | ICD-10-CM

## 2018-03-30 NOTE — Therapy (Signed)
[place], [place], [address] Phone: [phone]   Fax:  [phone]  Physical Therapy Treatment  Patient Details  Name: [facility] [name] MRN: [id number] Date of Birth: 04-Jun-1941 Referring Provider: Zollie Beckers, MD   Encounter Date: 03/30/2018  PT End of Session - 03/30/18 0854    Visit Number  4    Number of Visits  13    Date for PT Re-Evaluation  04/19/18    Authorization Type  Medicare    PT Start Time  0845    PT Stop Time  0930    PT Time Calculation (min)  45 min       Past Medical History:  Diagnosis Date  . Arthritis   . CAD (coronary artery disease)   . Carotid artery occlusion   . Cataract    Bil eyes/worse in left eye  . CHF (congestive heart failure) ([place])   . Chronic back pain   . DVT (deep venous thrombosis) ([place])   . Dysrhythmia   . Enlarged prostate    takes Rapaflo daily  . GERD (gastroesophageal reflux disease)    occasional  . History of colon polyps   . History of gout    has colchicine prn  . History of kidney stones   . Hyperlipidemia    takes Crestor daily  . Hypertension    takes Amlodipine daily  . Hypothyroidism   . Myocardial infarction ([place])   . Peripheral vascular disease ([place])   . Pulmonary emboli ([place]) 03/20/2015   elevated d-dimer, intermediate V/Q study, atypical chest pain and SOB. Start on Xarelto 20mg  BID for 3 month  . Renal insufficiency   . Shortness of breath dyspnea   . Urinary frequency   . Urinary urgency     Past Surgical History:  Procedure Laterality Date  . APPENDECTOMY    . BACK SURGERY     5 times  . big toe surgery    . CARDIAC CATHETERIZATION     2010    dr Acie Fredrickson  . cataract surgery     left eye  . CHOLECYSTECTOMY N/A 07/27/2016   Procedure: LAPAROSCOPIC CHOLECYSTECTOMY;  Surgeon: Mickeal Skinner, MD;  Location: [facility];  Service: General;  Laterality: N/A;  . COLONOSCOPY    . CORONARY ARTERY BYPASS GRAFT N/A  04/05/2015   Procedure: CORONARY ARTERY BYPASS GRAFTING (CABG)X4 LIMA-LAD; SVG-DIAG1-DIAG2; SVG-PD;  Surgeon: [name], MD;  Location: [facility];  Service: Open Heart Surgery;  Laterality: N/A;  . CYSTOSCOPY    . ENDARTERECTOMY Left 04/24/2016   Procedure: ENDARTERECTOMY LEFT CAROTID;  Surgeon: Mal Misty, MD;  Location: [facility];  Service: Vascular;  Laterality: Left;  . EYE SURGERY    . FEMORAL ARTERY - POPLITEAL ARTERY BYPASS GRAFT    . JOINT REPLACEMENT     shoulder  . LEFT HEART CATHETERIZATION WITH CORONARY ANGIOGRAM N/A 04/03/2015   Procedure: LEFT HEART CATHETERIZATION WITH CORONARY ANGIOGRAM;  Surgeon: Troy Sine, MD;  Location: [facility] CATH LAB;  Service: Cardiovascular;  Laterality: N/A;  . LUMBAR LAMINECTOMY  01/06/2013   Procedure: MICRODISCECTOMY LUMBAR LAMINECTOMY;  Surgeon: Marybelle Killings, MD;  Location: [facility];  Service: Orthopedics;  Laterality: N/A;  L3-4 decompression  . LUMBAR LAMINECTOMY/DECOMPRESSION MICRODISCECTOMY  02/12/2012   Procedure: LUMBAR LAMINECTOMY/DECOMPRESSION MICRODISCECTOMY;  Surgeon: Floyce Stakes, MD;  Location: [facility];  Service: Neurosurgery;  Laterality: N/A;  Lumbar four-five laminectomy  . PATCH ANGIOPLASTY Left 04/24/2016   Procedure:  LEFT CAROTID ARTERY PATCH ANGIOPLASTY;  Surgeon: Mal Misty, MD;  Location: [facility];  Service: Vascular;  Laterality: Left;  . STERIOD INJECTION Right 01/09/2014   Procedure: STEROID INJECTION;  Surgeon: Mcarthur Rossetti, MD;  Location: [facility];  Service: Orthopedics;  Laterality: Right;  . TEE WITHOUT CARDIOVERSION N/A 04/05/2015   Procedure: TRANSESOPHAGEAL ECHOCARDIOGRAM (TEE);  Surgeon: [name], MD;  Location: [facility];  Service: Open Heart Surgery;  Laterality: N/A;  . TOTAL HIP ARTHROPLASTY Left 01/09/2014   DR Ninfa Linden  . TOTAL HIP ARTHROPLASTY Left 01/09/2014   Procedure: LEFT TOTAL HIP ARTHROPLASTY ANTERIOR APPROACH and Steroid Injection Right hip;  Surgeon: Mcarthur Rossetti, MD;   Location: [facility];  Service: Orthopedics;  Laterality: Left;    There were no vitals filed for this visit.      [facility] PT Assessment - 03/30/18 0001      Strength   Right Hip ABduction  3+/5    Left Hip ABduction  3+/5                   [facility] Adult PT Treatment/Exercise - 03/30/18 0001      Knee/Hip Exercises: Stretches   Active Hamstring Stretch  3 reps;30 seconds supine with strap    Hip Flexor Stretch  1 rep;60 seconds    ITB Stretch  2 reps;20 seconds strap      Knee/Hip Exercises: Aerobic   Nustep  L2 x 5 minutes LE only       Knee/Hip Exercises: Standing   Forward Step Up  Right;Left;15 reps;Hand Hold: 1;Step Height: 6"      Knee/Hip Exercises: Seated   Sit to Sand  10 reps;without UE support      Knee/Hip Exercises: Supine   Bridges  2 sets;10 reps    Bridges with Clamshell  10 reps green    Straight Leg Raises  Both;2 sets;10 reps    Other Supine Knee/Hip Exercises  green band clams unilateral and bilateral       Knee/Hip Exercises: Sidelying   Hip ABduction  2 sets;10 reps    Clams  10x 2 green               PT Short Term Goals - 03/08/18 1259      PT SHORT TERM GOAL #1   Title  STG=LTG        PT Long Term Goals - 03/08/18 1302      PT LONG TERM GOAL #1   Title  "Pt will be independent with advanced HEP.     Time  6    Period  Weeks    Status  New    [place] Date  04/19/18      PT LONG TERM GOAL #2   Title  Pt will be able to shop for 1 hour without depending on shopping cart for mobility    Time  6    Period  Weeks    Status  New    Target Date  04/19/18      PT LONG TERM GOAL #3   Title  "FOTO will improve from 49% limitation    to  43% limitation   indicating improved functional mobility     Time  6    Period  Weeks    Status  New    Target Date  04/19/18      PT LONG TERM GOAL #4   Title  Pt will be able to walk for exercise  for at least 20 minutes without exacerbating pain in bil hips    Time  6    Period   Weeks    Status  New    Target Date  04/19/18      PT LONG TERM GOAL #5   Title  "Pt will improve bil hip  strength to >/= 4+/5 with </= 2/10 pain to promote safety with walking/standing activities    Time  6    Period  Weeks    Status  New    Target Date  04/19/18            Plan - 03/30/18 0849    Clinical Impression Statement  Pt reports able to make it to the mailbox and back last week and able to make it 100 ft from the house yesterday. Still has good and bad days and reports mostly the same frequency of good and bad days. Today is a good day with pain at 3-4/10. Today he was was able to tolerate Nustep for full 5 minutes without fatigue. His is abduction strength has improved. He is able to perform single leg bridges, demonstrating improved ihp extension strength. Progressing toward strength goals.     PT Next Visit Plan  Review HEP, progress strength as able, core , is he doing clams at home-do not see it in HEP-he says he is    PT Home Exercise Plan  Hip flexor stretch, piriformis, figure 4 , hamstring stretch, ITB with strap, hip abduction, clam green band sidelying    Consulted and Agree with Plan of Care  Patient       Patient will benefit from skilled therapeutic intervention in order to improve the following deficits and impairments:     Visit Diagnosis: Pain in left hip  Pain in right hip  Stiffness of left hip, not elsewhere classified  Stiffness of right hip, not elsewhere classified  Difficulty in walking, not elsewhere classified  Muscle weakness (generalized)     Problem List Patient Active Problem List   Diagnosis Date Noted  . Chest pain 02/07/2018  . Elevated troponin 03/23/2017  . Sigmoid diverticulitis 03/23/2017  . Acute pyelonephritis 03/23/2017  . AKI (acute kidney injury) ([place]) 03/23/2017  . Wrist pain, acute, right   . Acute systolic heart failure ([place])   . Hypothyroidism   . Rapid atrial fibrillation ([place])   . Dyspnea 02/26/2017   . Shoulder blade pain 02/26/2017  . SOB (shortness of breath) 02/26/2017  . Chronic right shoulder pain   . Chronic systolic CHF (congestive heart failure) ([place])   . HLD (hyperlipidemia) 01/20/2017  . Chronic cholecystitis 07/27/2016  . PAD (peripheral artery disease) ([place]) 07/09/2015  . S/P CABG x 4 04/05/2015  . Coronary artery disease involving native coronary artery of native heart with angina pectoris ([place])   . Atypical chest pain 03/20/2015  . Carotid artery disease ([place]) 10/09/2014  . PVD (peripheral vascular disease) ([place]) 10/09/2014  . HTN (hypertension) 05/30/2014  . Arthritis of left hip 01/09/2014  . Status post THR (total hip replacement) 01/09/2014  . Spinal stenosis, lumbar 01/06/2013    Class: Diagnosis of  . Atherosclerosis of native artery of extremity with intermittent claudication ([place]) 10/18/2012    Dorene Ar, PTA 03/30/2018, 9:56 AM  [place], [place], [address] Phone: [phone]   Fax:  [phone]  Name: [facility] [name] MRN: [id number] Date of Birth: 16-Sep-1941

## 2018-04-04 ENCOUNTER — Ambulatory Visit: Payer: Medicare Other | Admitting: Physical Therapy

## 2018-04-06 ENCOUNTER — Ambulatory Visit: Payer: Medicare Other | Admitting: Physical Therapy

## 2018-04-11 ENCOUNTER — Encounter: Payer: Medicare Other | Admitting: Physical Therapy

## 2018-04-12 ENCOUNTER — Encounter: Payer: Self-pay | Admitting: Physical Therapy

## 2018-04-12 ENCOUNTER — Ambulatory Visit: Payer: Medicare Other | Admitting: Physical Therapy

## 2018-04-12 DIAGNOSIS — R262 Difficulty in walking, not elsewhere classified: Secondary | ICD-10-CM

## 2018-04-12 DIAGNOSIS — M25551 Pain in right hip: Secondary | ICD-10-CM

## 2018-04-12 DIAGNOSIS — M25552 Pain in left hip: Secondary | ICD-10-CM | POA: Diagnosis not present

## 2018-04-12 DIAGNOSIS — M25652 Stiffness of left hip, not elsewhere classified: Secondary | ICD-10-CM | POA: Diagnosis not present

## 2018-04-12 DIAGNOSIS — M25651 Stiffness of right hip, not elsewhere classified: Secondary | ICD-10-CM

## 2018-04-12 DIAGNOSIS — M6281 Muscle weakness (generalized): Secondary | ICD-10-CM

## 2018-04-12 NOTE — Therapy (Signed)
[place], [place], [address] Phone: [phone]   Fax:  [phone]  Physical Therapy Treatment  Patient Details  Name: Ryan [name] MRN: [id number] Date of Birth: [birth year] Referring Provider: Zollie Beckers, MD   Encounter Date: 04/12/2018  PT End of Session - 04/12/18 0908    Visit Number  5    Number of Visits  13    Date for PT Re-Evaluation  04/19/18    Authorization Type  Medicare    PT Start Time  0850    PT Stop Time  0930    PT Time Calculation (min)  40 min    Activity Tolerance  Patient tolerated treatment well    Behavior During Therapy  [facility] for tasks assessed/performed       Past Medical History:  Diagnosis Date  . Arthritis   . CAD (coronary artery disease)   . Carotid artery occlusion   . Cataract    Bil eyes/worse in left eye  . CHF (congestive heart failure) ([place])   . Chronic back pain   . DVT (deep venous thrombosis) ([place])   . Dysrhythmia   . Enlarged prostate    takes Rapaflo daily  . GERD (gastroesophageal reflux disease)    occasional  . History of colon polyps   . History of gout    has colchicine prn  . History of kidney stones   . Hyperlipidemia    takes Crestor daily  . Hypertension    takes Amlodipine daily  . Hypothyroidism   . Myocardial infarction ([place])   . Peripheral vascular disease ([place])   . Pulmonary emboli ([place]) 03/20/2015   elevated d-dimer, intermediate V/Q study, atypical chest pain and SOB. Start on Xarelto 20mg  BID for 3 month  . Renal insufficiency   . Shortness of breath dyspnea   . Urinary frequency   . Urinary urgency     Past Surgical History:  Procedure Laterality Date  . APPENDECTOMY    . BACK SURGERY     5 times  . big toe surgery    . CARDIAC CATHETERIZATION     2010    dr Acie Fredrickson  . cataract surgery     left eye  . CHOLECYSTECTOMY N/A 07/27/2016   Procedure: LAPAROSCOPIC CHOLECYSTECTOMY;  Surgeon: Mickeal Skinner,  MD;  Location: [facility];  Service: General;  Laterality: N/A;  . COLONOSCOPY    . CORONARY ARTERY BYPASS GRAFT N/A 04/05/2015   Procedure: CORONARY ARTERY BYPASS GRAFTING (CABG)X4 LIMA-LAD; SVG-DIAG1-DIAG2; SVG-PD;  Surgeon: [name], MD;  Location: [facility];  Service: Open Heart Surgery;  Laterality: N/A;  . CYSTOSCOPY    . ENDARTERECTOMY Left 04/24/2016   Procedure: ENDARTERECTOMY LEFT CAROTID;  Surgeon: Mal Misty, MD;  Location: [facility];  Service: Vascular;  Laterality: Left;  . EYE SURGERY    . FEMORAL ARTERY - POPLITEAL ARTERY BYPASS GRAFT    . JOINT REPLACEMENT     shoulder  . LEFT HEART CATHETERIZATION WITH CORONARY ANGIOGRAM N/A 04/03/2015   Procedure: LEFT HEART CATHETERIZATION WITH CORONARY ANGIOGRAM;  Surgeon: Troy Sine, MD;  Location: [facility] CATH LAB;  Service: Cardiovascular;  Laterality: N/A;  . LUMBAR LAMINECTOMY  01/06/2013   Procedure: MICRODISCECTOMY LUMBAR LAMINECTOMY;  Surgeon: Marybelle Killings, MD;  Location: [facility];  Service: Orthopedics;  Laterality: N/A;  L3-4 decompression  . LUMBAR LAMINECTOMY/DECOMPRESSION MICRODISCECTOMY  02/12/2012   Procedure: LUMBAR LAMINECTOMY/DECOMPRESSION MICRODISCECTOMY;  Surgeon: Floyce Stakes, MD;  Location: [facility]  NEURO ORS;  Service: Neurosurgery;  Laterality: N/A;  Lumbar four-five laminectomy  . PATCH ANGIOPLASTY Left 04/24/2016   Procedure: LEFT CAROTID ARTERY PATCH ANGIOPLASTY;  Surgeon: Mal Misty, MD;  Location: [place];  Service: Vascular;  Laterality: Left;  . STERIOD INJECTION Right 01/09/2014   Procedure: STEROID INJECTION;  Surgeon: Mcarthur Rossetti, MD;  Location: [facility];  Service: Orthopedics;  Laterality: Right;  . TEE WITHOUT CARDIOVERSION N/A 04/05/2015   Procedure: TRANSESOPHAGEAL ECHOCARDIOGRAM (TEE);  Surgeon: [name], MD;  Location: [facility];  Service: Open Heart Surgery;  Laterality: N/A;  . TOTAL HIP ARTHROPLASTY Left 01/09/2014   DR Ninfa Linden  . TOTAL HIP ARTHROPLASTY Left 01/09/2014   Procedure: LEFT  TOTAL HIP ARTHROPLASTY ANTERIOR APPROACH and Steroid Injection Right hip;  Surgeon: Mcarthur Rossetti, MD;  Location: [facility];  Service: Orthopedics;  Laterality: Left;    There were no vitals filed for this visit.  Subjective Assessment - 04/12/18 0855    Subjective  I came last week and I got sick with a virus and I needed to leave.  I think that hip flexor stretch hurt my back.  I want to know how to do it without hurting my back.  I might be arching my back too much. that cortisone shot really helped my    Pertinent History  HTN, CAD  See extensive medical history hospitalized for heart issues last month.  Left > 3 yrs THA, , left TSR > 10 years, Back surgery x 5     Limitations  Standing;Walking;House hold activities    How long can you sit comfortably?  unlimited    How long can you stand comfortably?  30 minutes but no pain    How long can you walk comfortably?  I can only walk about 400 feet on cement     Patient Stated Goals  I want to walk without pain, I would like to shop without being dependent on a shopping cart    Currently in Pain?  Yes    Pain Score  0-No pain with walking 8/10 again but standing ok and proceed    Pain Orientation  Left    Pain Descriptors / Indicators  Sore;Numbness    Pain Onset  More than a month ago    Pain Frequency  Intermittent         [facility] PT Assessment - 04/12/18 0001      Strength   Right Hip Extension  3+/5    Right Hip ABduction  4-/5    Left Hip Extension  3+/5    Left Hip ABduction  4-/5                   [facility] Adult PT Treatment/Exercise - 04/12/18 0907      Self-Care   Self-Care  Other Self-Care Comments    Other Self-Care Comments   walking program 10 -15 minutes  trying to increase tolerance to 20 minutes goal educated walking for time       Knee/Hip Exercises: Stretches   Active Hamstring Stretch  30 seconds;2 reps supine with strap    Hip Flexor Stretch  1 rep;60 seconds VC andTC to perform correctly without  pain    ITB Stretch  2 reps;20 seconds strap    Other Knee/Hip Stretches  sciatic nerve glide 10 x right and left with DF and hamstring stretch      Knee/Hip Exercises: Standing   Other Standing Knee Exercises  standing SLR  with hip ext, hip abd x 15 each bil and hip marching x 15  pt understanding importance of increasing standing tolerance      Knee/Hip Exercises: Supine   Bridges  2 sets;10 reps    Other Supine Knee/Hip Exercises  supine nerve glide 10 x 2  right and left       Knee/Hip Exercises: Sidelying   Clams  10x 2 green             PT Education - 04/12/18 0929    Education provided  Yes    Education Details  reviewed proper hip flexor stretch and strategy without pain,  added to HEP standing SLR andclams sidelying and sciatic nerve glide    Person(s) Educated  Patient    Methods  Explanation;Demonstration;Tactile cues;Verbal cues;Handout    Comprehension  Verbalized understanding;Returned demonstration       PT Short Term Goals - 04/12/18 0949      PT SHORT TERM GOAL #1   Title  STG=LTG        PT Long Term Goals - 04/12/18 0909      PT LONG TERM GOAL #1   Title  "Pt will be independent with advanced HEP.     Baseline  progressing strengthening    Time  6    Period  Weeks    Target Date  04/19/18      PT LONG TERM GOAL #2   Title  Pt will be able to shop for 1 hour without depending on shopping cart for mobility    Baseline  pt can shop as long as possible with shopping cart with minimal pain,  but without cart can only walk 5 minutes at [employer] before resting every few minutes    Time  6    Period  Weeks    Status  On-going    Target Date  04/19/18      PT LONG TERM GOAL #3   Title  "FOTO will improve from 49% limitation    to  43% limitation   indicating improved functional mobility     Time  6    Period  Weeks    Status  Unable to assess    Target Date  04/19/18      PT LONG TERM GOAL #4   Title  Pt will be able to walk for exercise for at  least 20 minutes without exacerbating pain in bil hips    Baseline  Pt takes 10- 70minutes now      Time  6    Period  Weeks    Status  On-going    Target Date  04/19/18      PT LONG TERM GOAL #5   Title  "Pt will improve bil hip  strength to >/= 4+/5 with </= 2/10 pain to promote safety with walking/standing activities    Baseline  See flow sheet hip abduction/ ext increase from eval. See flowsheet  Pain 0/10 at rest.  sometimes walking up to 8/10 after  30 minutes    Time  6    Period  Weeks    Status  On-going    Target Date  04/19/18            Plan - 04/12/18 0944    Clinical Impression Statement  Pt walking to the mailbox but stops every 3 minutes due to discomfort.  Pt still has good and bad days.  Pt educated on up and down of rehab and to  set mini goals for improvement and to keep moving.  Pt had no complaint of pain today.  Pt reviewed HEP and added challenge.   No goals achieved but MMT improved from evaluation.  Making progress to goal achievement.    Rehab Potential  Excellent    PT Frequency  2x / week    PT Duration  6 weeks    PT Treatment/Interventions  ADLs/Self Care Home Management;Cryotherapy;Electrical Stimulation;Moist Heat;Iontophoresis 4mg /ml Dexamethasone;Ultrasound;Therapeutic exercise;Therapeutic activities;Functional mobility training;Gait training;Stair training;Neuromuscular re-education;Patient/family education;Passive range of motion;Dry needling;Taping;Manual techniques    PT Next Visit Plan  Review HEP, progress strength and walking tolerance    PT Home Exercise Plan  Hip flexor stretch, piriformis, figure 4 , hamstring stretch, ITB with strap, hip abduction, clam green band sidelying staniding hip ext, and abduction.sidelying clams and sciatic nerve glide    Consulted and Agree with Plan of Care  Patient       Patient will benefit from skilled therapeutic intervention in order to improve the following deficits and impairments:  Abnormal gait, Pain,  Decreased activity tolerance, Decreased range of motion, Difficulty walking, Decreased strength, Hypomobility, Decreased mobility, Impaired flexibility, Improper body mechanics  Visit Diagnosis: Pain in left hip  Pain in right hip  Stiffness of left hip, not elsewhere classified  Stiffness of right hip, not elsewhere classified  Difficulty in walking, not elsewhere classified  Muscle weakness (generalized)     Problem List Patient Active Problem List   Diagnosis Date Noted  . Chest pain 02/07/2018  . Elevated troponin 03/23/2017  . Sigmoid diverticulitis 03/23/2017  . Acute pyelonephritis 03/23/2017  . AKI (acute kidney injury) ([place]) 03/23/2017  . Wrist pain, acute, right   . Acute systolic heart failure ([place])   . Hypothyroidism   . Rapid atrial fibrillation ([place])   . Dyspnea 02/26/2017  . Shoulder blade pain 02/26/2017  . SOB (shortness of breath) 02/26/2017  . Chronic right shoulder pain   . Chronic systolic CHF (congestive heart failure) ([place])   . HLD (hyperlipidemia) 01/20/2017  . Chronic cholecystitis 07/27/2016  . PAD (peripheral artery disease) ([place]) 07/09/2015  . S/P CABG x 4 04/05/2015  . Coronary artery disease involving native coronary artery of native heart with angina pectoris ([place])   . Atypical chest pain 03/20/2015  . Carotid artery disease ([place]) 10/09/2014  . PVD (peripheral vascular disease) ([place]) 10/09/2014  . HTN (hypertension) 05/30/2014  . Arthritis of left hip 01/09/2014  . Status post THR (total hip replacement) 01/09/2014  . Spinal stenosis, lumbar 01/06/2013    Class: Diagnosis of  . Atherosclerosis of native artery of extremity with intermittent claudication ([place]) 10/18/2012   Voncille Lo, PT Certified Exercise Expert for the Aging Adult  04/12/18 9:50 AM Phone: [phone] Fax: [facility] [address] [place], [place], [address] Phone: [phone]   Fax:   [phone]  Name: Ryan [name] MRN: [id number] Date of Birth: 09/29/1941

## 2018-04-12 NOTE — Patient Instructions (Signed)
  Hip Flexor Stretch   Lying on back near edge of bed, bend one leg, foot flat. Hang other leg over edge, relaxed, thigh resting entirely on bed for _2-3___ minutes. Repeat _on other side for 3 minutes___ times. One time a day. BE SURE TO BRING BELLY BUTTON TO SPINE.  THIS SHOULD NOT HURT BACK.  DONT OVER STRETCH     Access Code: [email or web address]  URL: [email or web address]  Date: 04/12/2018  Prepared by: Voncille Lo   Exercises  Supine Sciatic Nerve Glide - 10 reps - 2 sets - 3 hold - 1x daily - 7x weekly  Clamshell with Resistance - 10 reps - 2 sets - 3 hold - 1x daily - 7x weekly       try to get used to standing and using your legs and strengthen. Start walking for 15 minutes at a time and add to get to 20 minutes at a time.  Your goal is 20 minutes continuous walking   Knee High   Holding stable object, raise knee to hip level, then lower knee. Repeat with other knee. Complete __15_ repetitions. Do __2__ sessions per day.  ABDUCTION: Standing (Active)   Stand, feet flat. Lift right leg out to side. Use _0__ lbs. Complete __15_ repetitions. Perform __2_ sessions per day.   EXTENSION: Standing (Active)  Stand, both feet flat. Draw right leg behind body as far as possible. Use 0___ lbs. Complete 15 repetitions. Perform __2_ sessions per day.  Copyright  VHI. All rights reserved.   Voncille Lo, PT Certified Exercise Expert for the Aging Adult  04/12/18 9:19 AM Phone: [phone] Fax: [phone]

## 2018-04-14 ENCOUNTER — Ambulatory Visit: Payer: Medicare Other | Admitting: Physical Therapy

## 2018-04-14 ENCOUNTER — Encounter: Payer: Self-pay | Admitting: Physical Therapy

## 2018-04-14 VITALS — BP 161/83 | HR 60

## 2018-04-14 DIAGNOSIS — R262 Difficulty in walking, not elsewhere classified: Secondary | ICD-10-CM

## 2018-04-14 DIAGNOSIS — M25551 Pain in right hip: Secondary | ICD-10-CM | POA: Diagnosis not present

## 2018-04-14 DIAGNOSIS — M25652 Stiffness of left hip, not elsewhere classified: Secondary | ICD-10-CM

## 2018-04-14 DIAGNOSIS — M25552 Pain in left hip: Secondary | ICD-10-CM

## 2018-04-14 DIAGNOSIS — M25651 Stiffness of right hip, not elsewhere classified: Secondary | ICD-10-CM

## 2018-04-14 DIAGNOSIS — M6281 Muscle weakness (generalized): Secondary | ICD-10-CM | POA: Diagnosis not present

## 2018-04-14 NOTE — Therapy (Addendum)
[place], [place], [address] Phone: [phone]   Fax:  [phone]  Physical Therapy Treatment/Discharge Note  Patient Details  Name: Ryan [name] MRN: [id number] Date of Birth: September 01, 1941 Referring Provider: Zollie Beckers, MD   Encounter Date: 04/14/2018  PT End of Session - 04/14/18 0929    Visit Number  6    Number of Visits  13    Date for PT Re-Evaluation  04/19/18    Authorization Type  Medicare    PT Start Time  0850    PT Stop Time  0951    PT Time Calculation (min)  61 min    Activity Tolerance  Patient tolerated treatment well    Behavior During Therapy  [facility] for tasks assessed/performed       Past Medical History:  Diagnosis Date  . Arthritis   . CAD (coronary artery disease)   . Carotid artery occlusion   . Cataract    Bil eyes/worse in left eye  . CHF (congestive heart failure) ([place])   . Chronic back pain   . DVT (deep venous thrombosis) ([place])   . Dysrhythmia   . Enlarged prostate    takes Rapaflo daily  . GERD (gastroesophageal reflux disease)    occasional  . History of colon polyps   . History of gout    has colchicine prn  . History of kidney stones   . Hyperlipidemia    takes Crestor daily  . Hypertension    takes Amlodipine daily  . Hypothyroidism   . Myocardial infarction ([place])   . Peripheral vascular disease ([place])   . Pulmonary emboli ([place]) 03/20/2015   elevated d-dimer, intermediate V/Q study, atypical chest pain and SOB. Start on Xarelto 13m BID for 3 month  . Renal insufficiency   . Shortness of breath dyspnea   . Urinary frequency   . Urinary urgency     Past Surgical History:  Procedure Laterality Date  . APPENDECTOMY    . BACK SURGERY     5 times  . big toe surgery    . CARDIAC CATHETERIZATION     2010    dr nAcie Fredrickson . cataract surgery     left eye  . CHOLECYSTECTOMY N/A 07/27/2016   Procedure: LAPAROSCOPIC CHOLECYSTECTOMY;  Surgeon: LMickeal Skinner MD;  [place]  Service: General;  Laterality: N/A;  . COLONOSCOPY    . CORONARY ARTERY BYPASS GRAFT N/A 04/05/2015   Procedure: CORONARY ARTERY BYPASS GRAFTING (CABG)X4 LIMA-LAD; SVG-DIAG1-DIAG2; SVG-PD;  Surgeon: SMelrose Nakayama MD;  [place] [facility] [place] [facility]  Service: Open Heart Surgery;  Laterality: N/A;  . CYSTOSCOPY    . ENDARTERECTOMY Left 04/24/2016   Procedure: ENDARTERECTOMY LEFT CAROTID;  Surgeon: JMal Misty MD;  [place]  Service: Vascular;  Laterality: Left;  . EYE SURGERY    . FEMORAL ARTERY - POPLITEAL ARTERY BYPASS GRAFT    . JOINT REPLACEMENT     shoulder  . LEFT HEART CATHETERIZATION WITH CORONARY ANGIOGRAM N/A 04/03/2015   Procedure: LEFT HEART CATHETERIZATION WITH CORONARY ANGIOGRAM;  Surgeon: TTroy Sine MD;  [facility]  Service: Cardiovascular;  Laterality: N/A;  . LUMBAR LAMINECTOMY  01/06/2013   Procedure: MICRODISCECTOMY LUMBAR LAMINECTOMY;  Surgeon: MMarybelle Killings MD;  [place]  Service: Orthopedics;  Laterality: N/A;  L3-4 decompression  . LUMBAR LAMINECTOMY/DECOMPRESSION MICRODISCECTOMY  02/12/2012   Procedure: LUMBAR LAMINECTOMY/DECOMPRESSION MICRODISCECTOMY;  Surgeon: EFloyce Stakes MD;  Location:  [place] [facility];  Service: Neurosurgery;  Laterality: N/A;  Lumbar four-five laminectomy  . PATCH ANGIOPLASTY Left 04/24/2016   Procedure: LEFT CAROTID ARTERY PATCH ANGIOPLASTY;  Surgeon: Mal Misty, MD;  Location: [place];  Service: Vascular;  Laterality: Left;  . STERIOD INJECTION Right 01/09/2014   Procedure: STEROID INJECTION;  Surgeon: Mcarthur Rossetti, MD;  Location: [place];  Service: Orthopedics;  Laterality: Right;  . TEE WITHOUT CARDIOVERSION N/A 04/05/2015   Procedure: TRANSESOPHAGEAL ECHOCARDIOGRAM (TEE);  Surgeon: [name], MD;  Location: [place];  Service: Open Heart Surgery;  Laterality: N/A;  . TOTAL HIP ARTHROPLASTY Left 01/09/2014   DR Ninfa Linden  . TOTAL HIP ARTHROPLASTY Left 01/09/2014    Procedure: LEFT TOTAL HIP ARTHROPLASTY ANTERIOR APPROACH and Steroid Injection Right hip;  Surgeon: Mcarthur Rossetti, MD;  Location: [place];  Service: Orthopedics;  Laterality: Left;    Vitals:   04/14/18 0854  BP: (!) 161/83  Pulse: 60    Subjective Assessment - 04/14/18 0855    Subjective  I worked in the yard and my legs didnt bother me.  I dont have any pain right now and I was able to work inthe yard.  I feel dizzy because of the blood pressure meds.  BP taken at PT  161/83 pulse 60    Pertinent History  HTN, CAD  See extensive medical history hospitalized for heart issues last month.  Left > 3 yrs THA, , left TSR > 10 years, Back surgery x 5     Limitations  Standing;Walking;House hold activities    How long can you sit comfortably?  unlimited    How long can you stand comfortably?  30 minutes but no pain    Currently in Pain?  No/denies    Pain Score  -- sometimes when I walk it is tingling     Pain Orientation  Left         [facility] PT Assessment - 04/14/18 0951      Observation/Other Assessments   Focus on Therapeutic Outcomes (FOTO)   FOTO intake 49% predicted 43% 35% limitation today                   [facility] Adult PT Treatment/Exercise - 04/14/18 0903      Self-Care   Self-Care  Other Self-Care Comments    Other Self-Care Comments   care of stenotic symptoms and walking and position to reduce tingling. in legs.  Pt understands importance of abdominal/ core strength/ back strength for reduction of symptoms      Knee/Hip Exercises: Stretches   Active Hamstring Stretch  30 seconds;2 reps supine with strap    Hip Flexor Stretch  1 rep;60 seconds VC andTC to perform correctly without pain    ITB Stretch  2 reps;20 seconds strap    Other Knee/Hip Stretches  sciatic nerve glide 10 x right and left with DF and hamstring stretch      Knee/Hip Exercises: Aerobic   Tread Mill  Pt on treadmill 5 minutes and begins to have stenotic symptoms.  better on incline and  bent forward. .7 MPH speed      Knee/Hip Exercises: Standing   Heel Raises  15 reps;Both    Lateral Step Up  1 set;Left;Right;15 reps;Hand Hold: 1    Forward Step Up  1 set;Hand Hold: 1;Step Height: 6";15 reps;Right;Left    Other Standing Knee Exercises  standing SLR with hip ext, hip abd x 15 each bil and hip marching  x 15  increaseing standing tolerance 5lb    Other Standing Knee Exercises  sink squat x 15      Knee/Hip Exercises: Supine   Bridges  2 sets;10 reps 7.5 lbs on hip    Straight Leg Raises  15 reps;Both with 5 lbs    Other Supine Knee/Hip Exercises  supine pre pilates exercises  pelvic tilt x 5, ab set 10 sec hold x 3 and supine knee fall outs x 5 and supine marching with VC for breathing      Knee/Hip Exercises: Sidelying   Clams  10x 2 green bil              PT Education - 04/14/18 0916    Education provided  Yes    Education Details  added standing exercises for LE strength for advanced HEP    Person(s) Educated  Patient    Methods  Explanation;Demonstration;Tactile cues;Handout    Comprehension  Verbalized understanding;Returned demonstration       PT Short Term Goals - 04/12/18 0949      PT SHORT TERM GOAL #1   Title  STG=LTG        PT Long Term Goals - 04/14/18 0953      PT LONG TERM GOAL #1   Title  "Pt will be independent with advanced HEP.     Baseline  given Advanced HEP    Time  6    Period  Weeks    Status  On-going      PT LONG TERM GOAL #2   Title  Pt will be able to shop for 1 hour utilizing ab setting and UE support on shopping cart for mobility    Baseline  pt can shop as long as possible with shopping cart with minimal pain,  but without cart can only walk 5 minutes at [employer] before resting every few minutes    Time  6    Period  Weeks    Status  Revised      PT LONG TERM GOAL #3   Title  "FOTO will improve from 49% limitation    to  43% limitation   indicating improved functional mobility     Baseline  limitaion 35%    Time   6    Period  Weeks    Status  Achieved      PT LONG TERM GOAL #4   Title  Pt will be able to walk for exercise for at least 20 minutes without exacerbating pain in bil hips    Baseline  Pt takes 10 - 15 min but will attempt 20 minutes to barn    Time  6    Period  Weeks    Status  On-going      PT LONG TERM GOAL #5   Title  "Pt will improve bil hip  strength to >/= 4+/5 with </= 2/10 pain to promote safety with walking/standing activities    Baseline  will assess next visit for DC    Time  6    Period  Weeks    Status  Unable to assess            Plan - 04/14/18 0925    Clinical Impression Statement  Pt trying to increase walking. challenged to walk for 20 minutes at one time.  Pt given standing exercises with 5 lb weight.  Pt with no pain in bil hips.  sometimes still have pain in legs after walking probably due  to stenosis.  tried to walk on treadmill for 5 minutes.  Pt with stenotic symptoms but improved with pelvic tilt and ab set duing walking.  Pt FOTO improved to 35 % limitation.  Pt with 0/10 pain in bil hips.  but increasing tingling with back extension and walking.  Pt educated on condition. and need for core.  Pt is doing well with exericses HEP and will review and possible DC next visit     Rehab Potential  Excellent    PT Frequency  2x / week    PT Duration  6 weeks    PT Treatment/Interventions  ADLs/Self Care Home Management;Cryotherapy;Electrical Stimulation;Moist Heat;Iontophoresis 39m/ml Dexamethasone;Ultrasound;Therapeutic exercise;Therapeutic activities;Functional mobility training;Gait training;Stair training;Neuromuscular re-education;Patient/family education;Passive range of motion;Dry needling;Taping;Manual techniques    PT Next Visit Plan  FOTO  Review for DC  review pre pilates. Core and rest of HEP    PT Home Exercise Plan  Hip flexor stretch, piriformis, figure 4 , hamstring stretch, ITB with strap, hip abduction, clam green band sidelying staniding hip  ext, and abduction.sidelying clams and sciatic nerve glide stanidng exercises and squats for LE strengthe    Consulted and Agree with Plan of Care  Patient       Patient will benefit from skilled therapeutic intervention in order to improve the following deficits and impairments:  Abnormal gait, Pain, Decreased activity tolerance, Decreased range of motion, Difficulty walking, Decreased strength, Hypomobility, Decreased mobility, Impaired flexibility, Improper body mechanics  Visit Diagnosis: Pain in left hip  Pain in right hip  Stiffness of left hip, not elsewhere classified  Stiffness of right hip, not elsewhere classified  Difficulty in walking, not elsewhere classified     Problem List Patient Active Problem List   Diagnosis Date Noted  . Chest pain 02/07/2018  . Elevated troponin 03/23/2017  . Sigmoid diverticulitis 03/23/2017  . Acute pyelonephritis 03/23/2017  . AKI (acute kidney injury) ([place] 03/23/2017  . Wrist pain, acute, right   . Acute systolic heart failure ([place]   . Hypothyroidism   . Rapid atrial fibrillation ([place]   . Dyspnea 02/26/2017  . Shoulder blade pain 02/26/2017  . SOB (shortness of breath) 02/26/2017  . Chronic right shoulder pain   . Chronic systolic CHF (congestive heart failure) ([place]   . HLD (hyperlipidemia) 01/20/2017  . Chronic cholecystitis 07/27/2016  . PAD (peripheral artery disease) ([place] 07/09/2015  . S/P CABG x 4 04/05/2015  . Coronary artery disease involving native coronary artery of native heart with angina pectoris ([place]   . Atypical chest pain 03/20/2015  . Carotid artery disease ([place] 10/09/2014  . PVD (peripheral vascular disease) ([place] 10/09/2014  . HTN (hypertension) 05/30/2014  . Arthritis of left hip 01/09/2014  . Status post THR (total hip replacement) 01/09/2014  . Spinal stenosis, lumbar 01/06/2013    Class: Diagnosis of  . Atherosclerosis of native artery of extremity with intermittent claudication ([place]  10/18/2012    LVoncille Lo PT Certified Exercise Expert for the Aging Adult  04/14/18 10:03 AM Phone: [phone]: 3Chain LakeCAdvanced [facility] LLC[address] [address]Phone: [phone]  Fax:  [phone] Name: CKELLER BOUNDSMRN: [id number] of Birth: 802-02-1941  PHYSICAL THERAPY DISCHARGE SUMMARY  Visits from Start of Care: 6  Current functional level related to goals / functional outcomes: unknown   Remaining deficits: Pt making progress and improved FOTO score  Last seen as above, Pt did not return due to angina and  stent placed   Education / Equipment: HEP Plan:                                                    Patient goals were partially met. Patient is being discharged due to a change in medical status.  ?????    Pt discharged due to not returning to PT secondary to change in medical status and having angina and a stent placement.   Will discharge  Voncille Lo, PT Certified Exercise Expert for the Aging Adult  05/05/18 5:30 PM Phone: [phone] Fax: [phone]

## 2018-04-14 NOTE — Patient Instructions (Addendum)
SIT TO STAND:      May hold onto counter or chair in front.  Sit with feet shoulder-width apart, on floor. Lean chest forward, raise hips up from surface. Straighten hips and knees. Weight bear equally on left and right sides. _15_ reps per set, __2_ sets per day, _7__ days per week Place left leg closer to sitting surface. DO SLOWLY     Copyright  VHI. All rights reserved.  Step-Up: Lateral   Step up to side with right leg. Bring other foot up onto ____ inch step. Return to floor position with left leg. Repeat ____ times per session. Do ____ sessions per day. Repeat in dimly lit room. Repeat with eyes closed.  Copyright  VHI. All rights reserved.  Forward   Facing step, place one leg on step, flexed at hip. Step up slowly, bringing hips in line with knee and shoulder. Bring other foot onto step. Reverse process to step back down. Repeat with other leg. Do ____ repetitions, ____ sets.   Posture Pelvic Tilt    With back against wall, feet shoulder width apart, knees slightly bent, place one hand in curve of lower back. Try to flatten low back so hand feels increased pressure. Repeat _15___ times. Do _2___ sessions per day.  http://gt2.exer.us/850   Copyright  VHI. All rights reserved.     PELVIC TILT  Lie on back, legs bent. Exhale, tilting top of pelvis back, pubic bone up, to flatten lower back. Inhale, rolling pelvis opposite way, top forward, pubic bone down, arch in back. Repeat __10__ times. Do __2__ sessions per day. Copyright  VHI. All rights reserved.    Isometric Hold With Pelvic Floor (Hook-Lying)  Lie with hips and knees bent. Slowly inhale, and then exhale. Pull navel toward spine and tighten pelvic floor. Hold for __10_ seconds. Continue to breathe in and out during hold. Rest for _10__ seconds. Repeat __10_ times. Do __2-3_ times a day.   Knee Fold  Lie on back, legs bent, arms by sides. Exhale, lifting knee to chest. Inhale, returning. Keep  abdominals flat, navel to spine. Repeat __10__ times, alternating legs. Do __2__ sessions per day.  Knee Drop  Keep pelvis stable. Without rotating hips, slowly drop knee to side, pause, return to center, bring knee across midline toward opposite hip. Feel obliques engaging. Repeat for ___10_ times each leg.      Copyright  VHI. All rights reserved.   Pelvic Tilt   Flatten back by tightening stomach muscles and buttocks. Repeat __15__ times per set. Do _1___ sets per session. Do _2-3___ sessions per day. Or do standing as above http://orth.exer.us/134   Copyright  VHI. All rights reserved. Knee to Chest (Flexion)   Pull knee toward chest. Feel stretch in lower back or buttock area. Breathing deeply, Hold _10___ seconds. Repeat with other knee. Repeat __5__ times. Do __2__ sessions per day.                Voncille Lo, PT Certified Exercise Expert for the Aging Adult  04/14/18 9:19 AM Phone: [phone] Fax: [phone] .

## 2018-04-19 ENCOUNTER — Ambulatory Visit: Payer: Medicare Other | Admitting: Physical Therapy

## 2018-04-20 ENCOUNTER — Encounter

## 2018-04-20 ENCOUNTER — Ambulatory Visit (INDEPENDENT_AMBULATORY_CARE_PROVIDER_SITE_OTHER): Payer: Medicare Other | Admitting: Cardiovascular Disease

## 2018-04-20 ENCOUNTER — Encounter: Payer: Self-pay | Admitting: Cardiovascular Disease

## 2018-04-20 VITALS — BP 124/66 | HR 58 | Ht 66.0 in | Wt 174.0 lb

## 2018-04-20 DIAGNOSIS — I25110 Atherosclerotic heart disease of native coronary artery with unstable angina pectoris: Secondary | ICD-10-CM

## 2018-04-20 LAB — LIPID PANEL
CHOLESTEROL TOTAL: 236 mg/dL — AB (ref 100–199)
Chol/HDL Ratio: 5.5 ratio — ABNORMAL HIGH (ref 0.0–5.0)
HDL: 43 mg/dL (ref >39)
LDL Calculated: 166 mg/dL — ABNORMAL HIGH (ref 0–99)
Triglycerides: 137 mg/dL (ref 0–149)
VLDL Cholesterol Cal: 27 mg/dL (ref 5–40)

## 2018-04-20 LAB — HEPATIC FUNCTION PANEL
ALK PHOS: 47 IU/L (ref 39–117)
ALT: 14 IU/L (ref 0–44)
AST: 20 IU/L (ref 0–40)
Albumin: 4.1 g/dL (ref 3.5–4.8)
BILIRUBIN TOTAL: 0.7 mg/dL (ref 0.0–1.2)
BILIRUBIN, DIRECT: 0.18 mg/dL (ref 0.00–0.40)
Total Protein: 6.5 g/dL (ref 6.0–8.5)

## 2018-04-20 LAB — BASIC METABOLIC PANEL
BUN/Creatinine Ratio: 13 (ref 10–24)
BUN: 18 mg/dL (ref 8–27)
CALCIUM: 8.9 mg/dL (ref 8.6–10.2)
CHLORIDE: 101 mmol/L (ref 96–106)
CO2: 23 mmol/L (ref 20–29)
Creatinine, Ser: 1.41 mg/dL — ABNORMAL HIGH (ref 0.76–1.27)
GFR calc Af Amer: 56 mL/min/{1.73_m2} — ABNORMAL LOW (ref >59)
GFR calc non Af Amer: 48 mL/min/{1.73_m2} — ABNORMAL LOW (ref >59)
GLUCOSE: 108 mg/dL — AB (ref 65–99)
POTASSIUM: 4.3 mmol/L (ref 3.5–5.2)
SODIUM: 139 mmol/L (ref 134–144)

## 2018-04-20 LAB — CBC
HEMOGLOBIN: 14 g/dL (ref 13.0–17.7)
Hematocrit: 41.2 % (ref 37.5–51.0)
MCH: 32.5 pg (ref 26.6–33.0)
MCHC: 34 g/dL (ref 31.5–35.7)
MCV: 96 fL (ref 79–97)
Platelets: 234 10*3/uL (ref 150–379)
RBC: 4.31 x10E6/uL (ref 4.14–5.80)
RDW: 14 % (ref 12.3–15.4)
WBC: 8.1 10*3/uL (ref 3.4–10.8)

## 2018-04-20 MED ORDER — ISOSORBIDE MONONITRATE ER 30 MG PO TB24: 30.0000 mg | ORAL_TABLET | Freq: Every day | ORAL | 3 refills | Status: DC

## 2018-04-20 NOTE — Patient Instructions (Addendum)
Medication Instructions:  Your physician has recommended you make the following change in your medication:   START Imdur (Isosorbide mononitrate) 30 mg once daily   Labwork: TODAY - cholesterol, liver panel, basic metabolic panel, CBC   Testing/Procedures: Your physician has requested that you have a cardiac catheterization. Cardiac catheterization is used to diagnose and/or treat various heart conditions. Doctors may recommend this procedure for a number of different reasons. The most common reason is to evaluate chest pain. Chest pain can be a symptom of coronary artery disease (CAD), and cardiac catheterization can show whether plaque is narrowing or blocking your heart's arteries. This procedure is also used to evaluate the valves, as well as measure the blood flow and oxygen levels in different parts of your heart. For further information please visit HugeFiesta.tn. Please follow instruction sheet, as given.   Follow-Up: Your physician recommends that you return for a follow-up appointment in: 3 months with Dr. Acie Fredrickson on August 1 at 10:20   If you need a refill on your cardiac medications before your next appointment, please call your pharmacy.   Thank you for choosing [facility]! Christen Bame, RN [phone]     [place] OFFICE [address] [place] [address] [place] [address] Dept: [phone] Loc: [phone]  Ryan [name]  04/20/2018  You are scheduled for a Cardiac Catheterization on Friday, May 3 with Dr. Peter Martinique.  1. Please arrive at the [facility] (Main Entrance A) at [facility]: [address] [place], [place] [address] at 10:30 AM (two hours before your procedure to ensure your preparation). Free valet parking service is available.   Special note: Every effort is made to have your procedure done on time. Please understand that emergencies sometimes  delay scheduled procedures.  2. Diet: No solid food after midnight. You may have clear liquid breakfast (broth, soda, water, apple juice, Jello (except red), popsicles that are not red) until 5 AM on the day of your procedure  3. Labs: You will need to have blood drawn on Wednesday, May 1 at [facility] at [facility] [address]  Open: 7:30am - 5pm    Phone: [phone]. You do not need to be fasting.  4. Medication instructions in preparation for your procedure:   Stop taking Eliquis (Apixiban) on Tuesday, April 30.  DO NOT TAKE Lasix (Furosemide) on Friday May 3     On the morning of your procedure, take your Aspirin and any morning medicines NOT listed above.  You may use sips of water.  5. Plan for one night stay--bring personal belongings. 6. Bring a current list of your medications and current insurance cards. 7. You MUST have a responsible person to drive you home. 8. Someone MUST be with you the first 24 hours after you arrive home or your discharge will be delayed. 9. Please wear clothes that are easy to get on and off and wear slip-on shoes.  Thank you for allowing [place] to care for you!   -- [facility]

## 2018-04-20 NOTE — Progress Notes (Signed)
Problem List  1. CAD - CABG  2. Essential Hypertension 3. Hyperlipidemia 4. Left carotid arterectomy  5. Atrial fib  6. Gout    Ryan [name] is a 77 y.o. man  With a history of hyperlipidemia, coronary artery disease, hypothyroidism  He was admitted March 31 with CP and dyspnea.   VQ was intermediate.   Was clinically thought to have a PE  So he was DC'd on NOAC. Was readmitted several days later with STEMI and cath revealed severe CAD. CT angio on April 16 showed no evidence of pulmonary embolus.   Oct. 4, 2016:  Ryan [name] is doing well.  Occasional episodes of orthostatic hypotension Is eating some salty snacks. Has lots of hip pain with walking  Encouraged him to try a stationary bike or elliptical  Had peripheral arterial duplex scan ( ? At [place]) that was reportedly normal.   March 17, 2016  Thought he had some symptoms of food poisoning - occurred after eating .  Also may have developed back issues related to taking the crestor also  He stopped the crestor and the symptoms have resolved.   June 24, 2016:  Ryan [name] is seen today .   Spent the weekend at [facility]. - has a house there.  No CP or dyspnea.      March 10, 2017:  Ryan [name] is seen back after a recent hospitalization for new atrial fib .   Was started on amio and Eliquis  The echo shows an EF of 45%.  Moderate AS   Gets nausea after taking one of his meds.  Is now on Amio 400 BID,  Was also found to have gout  - was started on colchicine - feels better.  Is having some diarrhea from the colchicine   Is back in NSR   Aug. 14, 2018:  Ryan [name] is seen today for follow up of his CAD and atrial fib. He was hospitalized in April with urinary tract infection/pyelonephritis. He had mildly elevated troponin levels at that time. He also had acute kidney injury with a creatinine of 2.31 on admission. Creatinine has improved to 1.41  Had mildly elevated troponin levels at that time I had talked to Dr. Broadus John  about doing a Leane Call .   Still gets fatigued frequently . Has claudication .  Used to see Dr. Kellie Simmering.     Apr 20, 2018: Ryan [name] was admitted to the hospital in February, 2019 with chest pain. Took 1 sublingual nitroglycerin and 1 hydrocodone which relieved his chest pain.  Troponin levels were mildly elevated at 0.07, 0.08. Stress Myoview revealed a very small area of inferior ischemia. She did well and we decided to treat him medically.  He did not want to have a heart catheterization at that time.  He seems to have mildly elevated troponin levels on a chronic basis.  Has had recurrent CP  - one night he thought he would need to go to the hospital . He has stopped his rosuvastatin  and is taking just 1/2 of a Bisoprolol .  Has had progressive shortness of breath  Now he wants to have a cath    Medications Current Outpatient Medications  Medication Sig Dispense Refill  . apixaban (ELIQUIS) 5 MG TABS tablet Take 1 tablet (5 mg total) by mouth 2 (two) times daily. 60 tablet 11  . bisoprolol (ZEBETA) 5 MG tablet Take 0.5 tablets (2.5 mg total) by mouth daily. 30 tablet 0  . furosemide (LASIX) 20 MG tablet  Take 1 tablet (20 mg total) by mouth daily. 30 tablet 0  . HYDROcodone-acetaminophen (NORCO/VICODIN) 5-325 MG tablet Take 1 tablet by mouth every 12 (twelve) hours as needed for moderate pain or severe pain (pain). 10 tablet 0  . levothyroxine (UNITHROID) 50 MCG tablet Take 50 mcg by mouth daily before breakfast. "Unithroid"    . meclizine (ANTIVERT) 25 MG tablet Take 25 mg by mouth 3 (three) times daily as needed for dizziness.     . nitroGLYCERIN (NITROSTAT) 0.4 MG SL tablet Place 1 tablet (0.4 mg total) under the tongue every 5 (five) minutes as needed for chest pain (CP or SOB). 20 tablet 0  . potassium chloride SA (K-DUR,KLOR-CON) 20 MEQ tablet Take 2 tablets (40 mEq total) by mouth daily. (Patient taking differently: Take 20 mEq by mouth daily. ) 180 tablet 3  . senna (SENOKOT)  8.6 MG TABS tablet Take 1 tablet (8.6 mg total) by mouth daily. 30 each 0  . SUPREP BOWEL PREP KIT 17.5-3.13-1.6 GM/177ML SOLN Take 1 kit by mouth as directed. 2 Bottle 0  . tamsulosin (FLOMAX) 0.4 MG CAPS capsule Take 0.4 mg by mouth at bedtime.     No current facility-administered medications for this visit.    Review of systems: Noted in current history, otherwise review of systems is negative.  Physical Exam: Blood pressure 124/66, pulse (!) 58, height '5\' 6"'$  (1.676 m), weight 174 lb 0.6 oz (78.9 kg), SpO2 96 %.  GEN:  Well nourished, well developed in no acute distress HEENT: Normal NECK: No JVD; No carotid bruits LYMPHATICS: No lymphadenopathy CARDIAC: RR, with occasional prematures  RESPIRATORY:  Clear to auscultation without rales, wheezing or rhonchi  ABDOMEN: Soft, non-tender, non-distended MUSCULOSKELETAL:  No edema; No deformity  SKIN: Warm and dry NEUROLOGIC:  Alert and oriented x 3   ECG : Apr 20, 2018: Normal sinus rhythm at 60 beats minute.  First-degree AV block.  Prolonged QT with a QT interval of 500 ms.      Lab Results:  No results for input(s): WBC, HGB, HCT, PLT in the last 72 hours. BMET No results for input(s): NA, K, CL, CO2, GLUCOSE, BUN, CREATININE, CALCIUM in the last 72 hours.  Assessment/Plan    1. Paroxysmal Atrial fib:   Has a history of paroxysmal atrial fibrillation.  He is on Eliquis.  He needs a heart catheterization so we will stop the Eliquis at this point and try to arrange a heart catheter station on Friday.  2. CAD:   -He has a history of coronary artery bypass grafting on April 05, 2015.  He now has symptoms consistent with unstable angina.  He was admitted to the hospital in February but did not want to have a heart catheterization at that time.  He now presents having had several episodes of chest pain. Start him on isosorbide mononitrate 30 mg a day.  Discussed heart catheterization.  We discussed the risks, benefits, options.  He  understands and agrees to proceed..    3. Hyperlipidemia -    he has once again stopped his rosuvastatin.  We had enrolled him in the clear trial.  We will have to work on getting him back into the clear trial as that has been delayed.  4.  Hypothyroidism:   5..  Essential hypertension-   blood pressure is fairly well controlled.      Thayer Headings, Brooke Bonito., MD, [facility] 04/20/2018, 8:56 AM [address]  [place] [phone] Pager  [phone]  

## 2018-04-20 NOTE — H&P (View-Only) (Signed)
Problem List  1. CAD - CABG  2. Essential Hypertension 3. Hyperlipidemia 4. Left carotid arterectomy  5. Atrial fib  6. Gout    Ryan [name] is a 77 y.o. man  With a history of hyperlipidemia, coronary artery disease, hypothyroidism  He was admitted March 31 with CP and dyspnea.   VQ was intermediate.   Was clinically thought to have a PE  So he was DC'd on NOAC. Was readmitted several days later with STEMI and cath revealed severe CAD. CT angio on April 16 showed no evidence of pulmonary embolus.   Oct. 4, 2016:  Ryan [name] is doing well.  Occasional episodes of orthostatic hypotension Is eating some salty snacks. Has lots of hip pain with walking  Encouraged him to try a stationary bike or elliptical  Had peripheral arterial duplex scan ( ? At [place]) that was reportedly normal.   March 17, 2016  Thought he had some symptoms of food poisoning - occurred after eating .  Also may have developed back issues related to taking the crestor also  He stopped the crestor and the symptoms have resolved.   June 24, 2016:  Ryan [name] is seen today .   Spent the weekend at [facility]. - has a house there.  No CP or dyspnea.      March 10, 2017:  Ryan [name] is seen back after a recent hospitalization for new atrial fib .   Was started on amio and Eliquis  The echo shows an EF of 45%.  Moderate AS   Gets nausea after taking one of his meds.  Is now on Amio 400 BID,  Was also found to have gout  - was started on colchicine - feels better.  Is having some diarrhea from the colchicine   Is back in NSR   Aug. 14, 2018:  Ryan [name] is seen today for follow up of his CAD and atrial fib. He was hospitalized in April with urinary tract infection/pyelonephritis. He had mildly elevated troponin levels at that time. He also had acute kidney injury with a creatinine of 2.31 on admission. Creatinine has improved to 1.41  Had mildly elevated troponin levels at that time I had talked to Dr. Broadus John  about doing a Leane Call .   Still gets fatigued frequently . Has claudication .  Used to see Dr. Kellie Simmering.     Apr 20, 2018: Ryan [name] was admitted to the hospital in February, 2019 with chest pain. Took 1 sublingual nitroglycerin and 1 hydrocodone which relieved his chest pain.  Troponin levels were mildly elevated at 0.07, 0.08. Stress Myoview revealed a very small area of inferior ischemia. She did well and we decided to treat him medically.  He did not want to have a heart catheterization at that time.  He seems to have mildly elevated troponin levels on a chronic basis.  Has had recurrent CP  - one night he thought he would need to go to the hospital . He has stopped his rosuvastatin  and is taking just 1/2 of a Bisoprolol .  Has had progressive shortness of breath  Now he wants to have a cath    Medications Current Outpatient Medications  Medication Sig Dispense Refill  . apixaban (ELIQUIS) 5 MG TABS tablet Take 1 tablet (5 mg total) by mouth 2 (two) times daily. 60 tablet 11  . bisoprolol (ZEBETA) 5 MG tablet Take 0.5 tablets (2.5 mg total) by mouth daily. 30 tablet 0  . furosemide (LASIX) 20 MG tablet  Take 1 tablet (20 mg total) by mouth daily. 30 tablet 0  . HYDROcodone-acetaminophen (NORCO/VICODIN) 5-325 MG tablet Take 1 tablet by mouth every 12 (twelve) hours as needed for moderate pain or severe pain (pain). 10 tablet 0  . levothyroxine (UNITHROID) 50 MCG tablet Take 50 mcg by mouth daily before breakfast. "Unithroid"    . meclizine (ANTIVERT) 25 MG tablet Take 25 mg by mouth 3 (three) times daily as needed for dizziness.     . nitroGLYCERIN (NITROSTAT) 0.4 MG SL tablet Place 1 tablet (0.4 mg total) under the tongue every 5 (five) minutes as needed for chest pain (CP or SOB). 20 tablet 0  . potassium chloride SA (K-DUR,KLOR-CON) 20 MEQ tablet Take 2 tablets (40 mEq total) by mouth daily. (Patient taking differently: Take 20 mEq by mouth daily. ) 180 tablet 3  . senna (SENOKOT)  8.6 MG TABS tablet Take 1 tablet (8.6 mg total) by mouth daily. 30 each 0  . SUPREP BOWEL PREP KIT 17.5-3.13-1.6 GM/177ML SOLN Take 1 kit by mouth as directed. 2 Bottle 0  . tamsulosin (FLOMAX) 0.4 MG CAPS capsule Take 0.4 mg by mouth at bedtime.     No current facility-administered medications for this visit.    Review of systems: Noted in current history, otherwise review of systems is negative.  Physical Exam: Blood pressure 124/66, pulse (!) 58, height '5\' 6"'$  (1.676 m), weight 174 lb 0.6 oz (78.9 kg), SpO2 96 %.  GEN:  Well nourished, well developed in no acute distress HEENT: Normal NECK: No JVD; No carotid bruits LYMPHATICS: No lymphadenopathy CARDIAC: RR, with occasional prematures  RESPIRATORY:  Clear to auscultation without rales, wheezing or rhonchi  ABDOMEN: Soft, non-tender, non-distended MUSCULOSKELETAL:  No edema; No deformity  SKIN: Warm and dry NEUROLOGIC:  Alert and oriented x 3   ECG : Apr 20, 2018: Normal sinus rhythm at 60 beats minute.  First-degree AV block.  Prolonged QT with a QT interval of 500 ms.      Lab Results:  No results for input(s): WBC, HGB, HCT, PLT in the last 72 hours. BMET No results for input(s): NA, K, CL, CO2, GLUCOSE, BUN, CREATININE, CALCIUM in the last 72 hours.  Assessment/Plan    1. Paroxysmal Atrial fib:   Has a history of paroxysmal atrial fibrillation.  He is on Eliquis.  He needs a heart catheterization so we will stop the Eliquis at this point and try to arrange a heart catheter station on Friday.  2. CAD:   -He has a history of coronary artery bypass grafting on April 05, 2015.  He now has symptoms consistent with unstable angina.  He was admitted to the hospital in February but did not want to have a heart catheterization at that time.  He now presents having had several episodes of chest pain. Start him on isosorbide mononitrate 30 mg a day.  Discussed heart catheterization.  We discussed the risks, benefits, options.  He  understands and agrees to proceed..    3. Hyperlipidemia -    he has once again stopped his rosuvastatin.  We had enrolled him in the clear trial.  We will have to work on getting him back into the clear trial as that has been delayed.  4.  Hypothyroidism:   5..  Essential hypertension-   blood pressure is fairly well controlled.      Thayer Headings, Brooke Bonito., MD, [facility] 04/20/2018, 8:56 AM [address]  [place] [phone] Pager  [phone]  

## 2018-04-21 ENCOUNTER — Telehealth: Payer: Self-pay | Admitting: *Deleted

## 2018-04-21 ENCOUNTER — Ambulatory Visit: Payer: Medicare Other | Admitting: Physical Therapy

## 2018-04-21 NOTE — Telephone Encounter (Signed)
Pt contacted pre-catheterization scheduled at [facility] for: Friday Apr 22, 2018 12:30 PM Verified arrival time and place: [place] Entrance A at: 10:30 AM  No solid food after midnight prior to cath, clear liquids until 5 AM Verified diabetes medications.  Hold: Apixaban 04/20/18 until post cath Furosemide 04/20/18 until post cath KCL 04/20/18 until post cath.  Except hold medications AM meds can be  taken pre-cath with sip of water including: ASA 81 mg  Confirmed patient has responsible person to drive home post procedure and observe patient for 24 hours: yes

## 2018-04-22 ENCOUNTER — Ambulatory Visit (HOSPITAL_COMMUNITY)
Admission: RE | Admit: 2018-04-22 | Discharge: 2018-04-22 | Disposition: A | Discharge status: Home / Self Care | Payer: Medicare Other | Source: Ambulatory Visit | Attending: Cardiology | Admitting: Cardiology

## 2018-04-22 ENCOUNTER — Ambulatory Visit (HOSPITAL_COMMUNITY)
Admission: RE | Disposition: A | Discharge status: Home / Self Care | Payer: Self-pay | Source: Ambulatory Visit | Attending: Cardiology

## 2018-04-22 ENCOUNTER — Encounter: Payer: Medicare Other | Admitting: Gastroenterology

## 2018-04-22 DIAGNOSIS — I25119 Atherosclerotic heart disease of native coronary artery with unspecified angina pectoris: Secondary | ICD-10-CM

## 2018-04-22 DIAGNOSIS — I739 Peripheral vascular disease, unspecified: Secondary | ICD-10-CM | POA: Diagnosis present

## 2018-04-22 DIAGNOSIS — I480 Paroxysmal atrial fibrillation: Secondary | ICD-10-CM | POA: Insufficient documentation

## 2018-04-22 DIAGNOSIS — M109 Gout, unspecified: Secondary | ICD-10-CM | POA: Diagnosis not present

## 2018-04-22 DIAGNOSIS — Z7901 Long term (current) use of anticoagulants: Secondary | ICD-10-CM | POA: Diagnosis not present

## 2018-04-22 DIAGNOSIS — I252 Old myocardial infarction: Secondary | ICD-10-CM | POA: Insufficient documentation

## 2018-04-22 DIAGNOSIS — E785 Hyperlipidemia, unspecified: Secondary | ICD-10-CM | POA: Diagnosis present

## 2018-04-22 DIAGNOSIS — I25710 Atherosclerosis of autologous vein coronary artery bypass graft(s) with unstable angina pectoris: Secondary | ICD-10-CM | POA: Insufficient documentation

## 2018-04-22 DIAGNOSIS — I2582 Chronic total occlusion of coronary artery: Secondary | ICD-10-CM | POA: Diagnosis not present

## 2018-04-22 DIAGNOSIS — I10 Essential (primary) hypertension: Secondary | ICD-10-CM | POA: Diagnosis present

## 2018-04-22 DIAGNOSIS — E039 Hypothyroidism, unspecified: Secondary | ICD-10-CM | POA: Insufficient documentation

## 2018-04-22 DIAGNOSIS — I951 Orthostatic hypotension: Secondary | ICD-10-CM | POA: Diagnosis not present

## 2018-04-22 DIAGNOSIS — I2584 Coronary atherosclerosis due to calcified coronary lesion: Secondary | ICD-10-CM | POA: Diagnosis not present

## 2018-04-22 DIAGNOSIS — I25110 Atherosclerotic heart disease of native coronary artery with unstable angina pectoris: Secondary | ICD-10-CM | POA: Diagnosis present

## 2018-04-22 DIAGNOSIS — Z951 Presence of aortocoronary bypass graft: Secondary | ICD-10-CM

## 2018-04-22 HISTORY — PX: CORONARY/GRAFT ANGIOGRAPHY: CATH118237

## 2018-04-22 SURGERY — CORONARY/GRAFT ANGIOGRAPHY
Anesthesia: LOCAL

## 2018-04-22 MED ORDER — APIXABAN 5 MG PO TABS: 5.0000 mg | ORAL_TABLET | Freq: Two times a day (BID) | ORAL | 11 refills | Status: DC

## 2018-04-22 MED ORDER — FENTANYL CITRATE (PF) 100 MCG/2ML IJ SOLN
INTRAMUSCULAR | Status: DC | PRN
  Administered 2018-04-22: 25 ug via INTRAVENOUS

## 2018-04-22 MED ORDER — SODIUM CHLORIDE 0.9 % WEIGHT BASED INFUSION
3.0000 mL/kg/h | INTRAVENOUS | Status: AC
  Administered 2018-04-22: 3 mL/kg/h via INTRAVENOUS

## 2018-04-22 MED ORDER — SODIUM CHLORIDE 0.9% FLUSH
3.0000 mL | Freq: Two times a day (BID) | INTRAVENOUS | Status: DC
Start: 2018-04-23 — End: 2018-04-22

## 2018-04-22 MED ORDER — HEPARIN (PORCINE) IN NACL 2-0.9 UNITS/ML
INTRAMUSCULAR | Status: AC | PRN
  Administered 2018-04-22 (×2): 500 mL

## 2018-04-22 MED ORDER — MIDAZOLAM HCL 2 MG/2ML IJ SOLN
INTRAMUSCULAR | Status: AC
  Filled 2018-04-22: qty 2

## 2018-04-22 MED ORDER — SODIUM CHLORIDE 0.9 % WEIGHT BASED INFUSION: 1.0000 mL/kg/h | INTRAVENOUS | Status: AC

## 2018-04-22 MED ORDER — LIDOCAINE HCL (PF) 1 % IJ SOLN
INTRAMUSCULAR | Status: DC | PRN
  Administered 2018-04-22: 2 mL

## 2018-04-22 MED ORDER — SODIUM CHLORIDE 0.9% FLUSH
3.0000 mL | INTRAVENOUS | Status: DC | PRN
Start: 2018-04-22 — End: 2018-04-22

## 2018-04-22 MED ORDER — LIDOCAINE HCL (PF) 1 % IJ SOLN: INTRAMUSCULAR | Status: DC | PRN

## 2018-04-22 MED ORDER — ONDANSETRON HCL 4 MG/2ML IJ SOLN: 4.0000 mg | Freq: Four times a day (QID) | INTRAMUSCULAR | Status: DC | PRN

## 2018-04-22 MED ORDER — ASPIRIN 81 MG PO CHEW: 81.0000 mg | CHEWABLE_TABLET | ORAL | Status: DC

## 2018-04-22 MED ORDER — LIDOCAINE HCL (PF) 1 % IJ SOLN
INTRAMUSCULAR | Status: AC
  Filled 2018-04-22: qty 30

## 2018-04-22 MED ORDER — SODIUM CHLORIDE 0.9% FLUSH: 3.0000 mL | Freq: Two times a day (BID) | INTRAVENOUS | Status: DC

## 2018-04-22 MED ORDER — HEPARIN (PORCINE) IN NACL 1000-0.9 UT/500ML-% IV SOLN
INTRAVENOUS | Status: AC
  Filled 2018-04-22: qty 1000

## 2018-04-22 MED ORDER — VERAPAMIL HCL 2.5 MG/ML IV SOLN
INTRAVENOUS | Status: AC
  Filled 2018-04-22: qty 2

## 2018-04-22 MED ORDER — IOPAMIDOL (ISOVUE-370) INJECTION 76%
INTRAVENOUS | Status: AC
  Filled 2018-04-22: qty 125

## 2018-04-22 MED ORDER — FENTANYL CITRATE (PF) 100 MCG/2ML IJ SOLN
INTRAMUSCULAR | Status: AC
Start: 2018-04-22 — End: 2018-04-22
  Filled 2018-04-22: qty 2

## 2018-04-22 MED ORDER — MIDAZOLAM HCL 2 MG/2ML IJ SOLN
INTRAMUSCULAR | Status: DC | PRN
  Administered 2018-04-22: 2 mg via INTRAVENOUS
  Administered 2018-04-22: 1 mg via INTRAVENOUS

## 2018-04-22 MED ORDER — SODIUM CHLORIDE 0.9 % IV SOLN
250.0000 mL | INTRAVENOUS | Status: DC | PRN
Start: 2018-04-22 — End: 2018-04-22

## 2018-04-22 MED ORDER — HEPARIN SODIUM (PORCINE) 1000 UNIT/ML IJ SOLN
INTRAMUSCULAR | Status: AC
  Filled 2018-04-22: qty 1

## 2018-04-22 MED ORDER — HEPARIN SODIUM (PORCINE) 1000 UNIT/ML IJ SOLN
INTRAMUSCULAR | Status: DC | PRN
  Administered 2018-04-22: 4000 [IU] via INTRAVENOUS

## 2018-04-22 MED ORDER — SODIUM CHLORIDE 0.9 % WEIGHT BASED INFUSION: 1.0000 mL/kg/h | INTRAVENOUS | Status: DC

## 2018-04-22 MED ORDER — SODIUM CHLORIDE 0.9% FLUSH: 3.0000 mL | INTRAVENOUS | Status: DC | PRN

## 2018-04-22 MED ORDER — SODIUM CHLORIDE 0.9 % IV SOLN: 250.0000 mL | INTRAVENOUS | Status: DC | PRN

## 2018-04-22 MED ORDER — ACETAMINOPHEN 325 MG PO TABS: 650.0000 mg | ORAL_TABLET | ORAL | Status: DC | PRN

## 2018-04-22 MED ORDER — IOPAMIDOL (ISOVUE-370) INJECTION 76%
INTRAVENOUS | Status: DC | PRN
  Administered 2018-04-22: 95 mL via INTRA_ARTERIAL

## 2018-04-22 SURGICAL SUPPLY — 18 items
CATH INFINITI 5 FR MPA2 (CATHETERS) ×1 IMPLANT
CATH INFINITI 5 FR RCB (CATHETERS) ×1 IMPLANT
CATH INFINITI 5FR AL1 (CATHETERS) ×1 IMPLANT
CATH INFINITI MULTIPACK ST 5F (CATHETERS) ×1 IMPLANT
COVER PRB 48X5XTLSCP FOLD TPE (BAG) IMPLANT
COVER PROBE 5X48 (BAG) ×2
DEVICE RAD COMP TR BAND LRG (VASCULAR PRODUCTS) ×1 IMPLANT
GUIDEWIRE INQWIRE 1.5J.035X260 (WIRE) IMPLANT
INQWIRE 1.5J .035X260CM (WIRE) ×2
KIT HEART LEFT (KITS) ×2 IMPLANT
NDL PERC 21GX4CM (NEEDLE) IMPLANT
NEEDLE PERC 21GX4CM (NEEDLE) ×2 IMPLANT
PACK CARDIAC CATHETERIZATION (CUSTOM PROCEDURE TRAY) ×2 IMPLANT
SHEATH RAIN RADIAL 21G 6FR (SHEATH) ×1 IMPLANT
SYR MEDRAD MARK V 150ML (SYRINGE) ×1 IMPLANT
TRANSDUCER W/STOPCOCK (MISCELLANEOUS) ×2 IMPLANT
TUBING CIL FLEX 10 FLL-RA (TUBING) ×2 IMPLANT
WIRE EMERALD ST .035X260CM (WIRE) ×1 IMPLANT

## 2018-04-22 NOTE — Discharge Instructions (Signed)
Drink plenty of fluids over next 48 hours and keep left wrist elevated at heart level for 24 hours ° °Radial Site Care °Refer to this sheet in the next few weeks. These instructions provide you with information about caring for yourself after your procedure. Your health care provider may also give you more specific instructions. Your treatment has been planned according to current medical practices, but problems sometimes occur. Call your health care provider if you have any problems or questions after your procedure. °What can I expect after the procedure? °After your procedure, it is typical to have the following: °· Bruising at the radial site that usually fades within 1-2 weeks. °· Blood collecting in the tissue (hematoma) that may be painful to the touch. It should usually decrease in size and tenderness within 1-2 weeks. ° °Follow these instructions at home: °· Take medicines only as directed by your health care provider. °· You may shower 24-48 hours after the procedure or as directed by your health care provider. Remove the bandage (dressing) and gently wash the site with plain soap and water. Pat the area dry with a clean towel. Do not rub the site, because this may cause bleeding. °· Do not take baths, swim, or use a hot tub until your health care provider approves. °· Check your insertion site every day for redness, swelling, or drainage. °· Do not apply powder or lotion to the site. °· Do not flex or bend the affected arm for 24 hours or as directed by your health care provider. °· Do not push or pull heavy objects with the affected arm for 24 hours or as directed by your health care provider. °· Do not lift over 10 lb (4.5 kg) for 5 days after your procedure or as directed by your health care provider. °· Ask your health care provider when it is okay to: °? Return to work or school. °? Resume usual physical activities or sports. °? Resume sexual activity. °· Do not drive home if you are discharged the  same day as the procedure. Have someone else drive you. °· You may drive 24 hours after the procedure unless otherwise instructed by your health care provider. °· Do not operate machinery or power tools for 24 hours after the procedure. °· If your procedure was done as an outpatient procedure, which means that you went home the same day as your procedure, a responsible adult should be with you for the first 24 hours after you arrive home. °· Keep all follow-up visits as directed by your health care provider. This is important. °Contact a health care provider if: °· You have a fever. °· You have chills. °· You have increased bleeding from the radial site. Hold pressure on the site. °Get help right away if: °· You have unusual pain at the radial site. °· You have redness, warmth, or swelling at the radial site. °· You have drainage (other than a small amount of blood on the dressing) from the radial site. °· The radial site is bleeding, and the bleeding does not stop after 30 minutes of holding steady pressure on the site. °· Your arm or hand becomes pale, cool, tingly, or numb. °This information is not intended to replace advice given to you by your health care provider. Make sure you discuss any questions you have with your health care provider. °Document Released: 01/09/2011 Document Revised: 05/14/2016 Document Reviewed: 06/25/2014 °Elsevier Interactive Patient Education © 2018 [employer] ° °

## 2018-04-22 NOTE — Interval H&P Note (Signed)
History and Physical Interval Note:  04/22/2018 1:58 PM  Ryan [name]  has presented today for surgery, with the diagnosis of unstable angina  The various methods of treatment have been discussed with the patient and family. After consideration of risks, benefits and other options for treatment, the patient has consented to  Procedure(s): LEFT HEART CATH AND CORS/GRAFTS ANGIOGRAPHY (N/A) as a surgical intervention .  The patient's history has been reviewed, patient examined, no change in status, stable for surgery.  I have reviewed the patient's chart and labs.  Questions were answered to the patient's satisfaction.   Cath Lab Visit (complete for each Cath Lab visit)  Clinical Evaluation Leading to the Procedure:   ACS: No.  Non-ACS:    Anginal Classification: CCS II  Anti-ischemic medical therapy: Minimal Therapy (1 class of medications)  Non-Invasive Test Results: Intermediate-risk stress test findings: cardiac mortality 1-3%/year  Prior CABG: Previous CABG        Ryan [name] 04/22/2018 1:58 PM

## 2018-04-23 ENCOUNTER — Other Ambulatory Visit: Payer: Self-pay | Admitting: Cardiovascular Disease

## 2018-04-24 ENCOUNTER — Emergency Department (HOSPITAL_COMMUNITY): Payer: Medicare Other

## 2018-04-24 ENCOUNTER — Other Ambulatory Visit: Payer: Self-pay

## 2018-04-24 ENCOUNTER — Encounter (HOSPITAL_COMMUNITY): Payer: Self-pay | Admitting: *Deleted

## 2018-04-24 ENCOUNTER — Emergency Department (HOSPITAL_COMMUNITY)
Admission: EM | Admit: 2018-04-24 | Discharge: 2018-04-24 | Disposition: A | Discharge status: Home / Self Care | Payer: Medicare Other | Attending: Emergency Medicine | Admitting: Emergency Medicine

## 2018-04-24 DIAGNOSIS — I200 Unstable angina: Secondary | ICD-10-CM | POA: Diagnosis not present

## 2018-04-24 DIAGNOSIS — R42 Dizziness and giddiness: Secondary | ICD-10-CM | POA: Diagnosis not present

## 2018-04-24 DIAGNOSIS — Z87891 Personal history of nicotine dependence: Secondary | ICD-10-CM | POA: Diagnosis not present

## 2018-04-24 DIAGNOSIS — I209 Angina pectoris, unspecified: Secondary | ICD-10-CM

## 2018-04-24 DIAGNOSIS — E039 Hypothyroidism, unspecified: Secondary | ICD-10-CM | POA: Diagnosis not present

## 2018-04-24 DIAGNOSIS — I5022 Chronic systolic (congestive) heart failure: Secondary | ICD-10-CM | POA: Diagnosis not present

## 2018-04-24 DIAGNOSIS — Z79899 Other long term (current) drug therapy: Secondary | ICD-10-CM | POA: Diagnosis not present

## 2018-04-24 DIAGNOSIS — I110 Hypertensive heart disease with heart failure: Secondary | ICD-10-CM | POA: Diagnosis not present

## 2018-04-24 DIAGNOSIS — R0789 Other chest pain: Secondary | ICD-10-CM | POA: Diagnosis not present

## 2018-04-24 DIAGNOSIS — R079 Chest pain, unspecified: Secondary | ICD-10-CM | POA: Diagnosis not present

## 2018-04-24 LAB — BASIC METABOLIC PANEL
ANION GAP: 10 (ref 5–15)
BUN: 13 mg/dL (ref 6–20)
CALCIUM: 8.6 mg/dL — AB (ref 8.9–10.3)
CO2: 23 mmol/L (ref 22–32)
Chloride: 104 mmol/L (ref 101–111)
Creatinine, Ser: 1.43 mg/dL — ABNORMAL HIGH (ref 0.61–1.24)
GFR calc non Af Amer: 46 mL/min — ABNORMAL LOW (ref >60)
GFR, EST AFRICAN AMERICAN: 53 mL/min — AB (ref >60)
GLUCOSE: 128 mg/dL — AB (ref 65–99)
Potassium: 4.2 mmol/L (ref 3.5–5.1)
SODIUM: 137 mmol/L (ref 135–145)

## 2018-04-24 LAB — I-STAT TROPONIN, ED
TROPONIN I, POC: 0.06 ng/mL (ref 0.00–0.08)
TROPONIN I, POC: 0.06 ng/mL (ref 0.00–0.08)

## 2018-04-24 LAB — CBC
HCT: 41.1 % (ref 39.0–52.0)
HEMOGLOBIN: 13.2 g/dL (ref 13.0–17.0)
MCH: 31.1 pg (ref 26.0–34.0)
MCHC: 32.1 g/dL (ref 30.0–36.0)
MCV: 96.7 fL (ref 78.0–100.0)
Platelets: 225 10*3/uL (ref 150–400)
RBC: 4.25 MIL/uL (ref 4.22–5.81)
RDW: 14.1 % (ref 11.5–15.5)
WBC: 9.4 10*3/uL (ref 4.0–10.5)

## 2018-04-24 MED ORDER — ONDANSETRON HCL 4 MG/2ML IJ SOLN
4.0000 mg | Freq: Once | INTRAMUSCULAR | Status: AC
  Administered 2018-04-24: 4 mg via INTRAVENOUS
  Filled 2018-04-24: qty 2

## 2018-04-24 MED ORDER — MORPHINE SULFATE (PF) 4 MG/ML IV SOLN
4.0000 mg | Freq: Once | INTRAVENOUS | Status: AC
  Administered 2018-04-24: 4 mg via INTRAVENOUS
  Filled 2018-04-24: qty 1

## 2018-04-24 NOTE — ED Notes (Signed)
Resting quietly on stretcher - bag lunch given per pt request

## 2018-04-24 NOTE — Discharge Instructions (Addendum)
You were evaluated in the emergency department for chest pain.  We did blood work and an EKG that did not show any obvious cause of any heart damage.  You will need to contact her cardiologist tomorrow for further recommendations.  Please continue your regular medicines and return to the emergency department if any worsening symptoms.

## 2018-04-24 NOTE — ED Triage Notes (Addendum)
Pt was scheduled for a stent placement on Friday but doctor was unable to place stent. Pt has not taken his Eliquis since Thursday. Pt reports centralized chest pain with SOB onset yesterday that worsened at 4am. Pt also reports having cold symptoms with congestion.

## 2018-04-24 NOTE — ED Notes (Signed)
states here Friday for heart cath; h/o cardiac bypass 2016 - 2 MIs prior to bypass; states onset of mid chest pain x1 week - interm. associated with dyspnea - denies diaphoresis; seen in ED x3 days ago for same; reports took 3 NTG this am PTA - no relief hence he presented to ED; today's pain awoke him at 0400; resting comfortably on stretcher at this time with wife at bedside

## 2018-04-24 NOTE — ED Provider Notes (Signed)
[facility] EMERGENCY DEPARTMENT Provider Note   CSN: [id number] Arrival date & time: 04/24/18  9678     History   Chief Complaint Chief Complaint  Patient presents with  . Chest Pain    HPI Ryan [name] is a 77 y.o. male.  He is presenting with ongoing chest pain since his discharge on Friday.  He was admitted last week for intermittent chest pain and had a cardiac catheterization.  Per the patient's report they were unable to place any stents.  He has been off his Eliquis since then.  He is complaining of chest pain since discharge on Friday that acutely got worse at 4 AM.  He states he thought he was having his heart attack .  He rated it as 10 out of 10 sharp stabbing radiated through to his back.  He took 4 of his nitro which improved his pain to 4 out of 10 which he still has ongoing on presentation to the ED.  His cardiologist is Dr. Katharina Caper.  He states this pain is typical for his chest pain which he gets generally once a week that he has to take nitro for.  It is associated with some dizziness.  The history is provided by the patient.  Chest Pain   This is a recurrent problem. The current episode started 2 days ago. The problem occurs constantly. The problem has been gradually improving. The pain is associated with exertion. The pain is present in the substernal region. The pain is at a severity of 10/10. The quality of the pain is described as pressure-like. The pain radiates to the mid back. Associated symptoms include back pain, dizziness and exertional chest pressure. Pertinent negatives include no abdominal pain, no cough, no fever, no hemoptysis, no nausea, no palpitations, no shortness of breath and no vomiting. He has tried nitroglycerin for the symptoms.  His past medical history is significant for CAD, hyperlipidemia, hypertension and MI.  Pertinent negatives for past medical history include no seizures.    Past Medical History:  Diagnosis Date  .  Arthritis   . CAD (coronary artery disease)   . Carotid artery occlusion   . Cataract    Bil eyes/worse in left eye  . CHF (congestive heart failure) ([place])   . Chronic back pain   . DVT (deep venous thrombosis) ([place])   . Dysrhythmia   . Enlarged prostate    takes Rapaflo daily  . GERD (gastroesophageal reflux disease)    occasional  . History of colon polyps   . History of gout    has colchicine prn  . History of kidney stones   . Hyperlipidemia    takes Crestor daily  . Hypertension    takes Amlodipine daily  . Hypothyroidism   . Myocardial infarction ([place])   . Peripheral vascular disease ([place])   . Pulmonary emboli ([place]) 03/20/2015   elevated d-dimer, intermediate V/Q study, atypical chest pain and SOB. Start on Xarelto 20mg  BID for 3 month  . Renal insufficiency   . Shortness of breath dyspnea   . Urinary frequency   . Urinary urgency     Patient Active Problem List   Diagnosis Date Noted  . Chest pain 02/07/2018  . Elevated troponin 03/23/2017  . Sigmoid diverticulitis 03/23/2017  . Acute pyelonephritis 03/23/2017  . AKI (acute kidney injury) ([place]) 03/23/2017  . Wrist pain, acute, right   . Acute systolic heart failure ([place])   . Hypothyroidism   . Rapid atrial  fibrillation ([place])   . Dyspnea 02/26/2017  . Shoulder blade pain 02/26/2017  . SOB (shortness of breath) 02/26/2017  . Chronic right shoulder pain   . Chronic systolic CHF (congestive heart failure) ([place])   . HLD (hyperlipidemia) 01/20/2017  . Chronic cholecystitis 07/27/2016  . PAD (peripheral artery disease) ([place]) 07/09/2015  . S/P CABG x 4 04/05/2015  . Coronary artery disease involving native coronary artery of native heart with angina pectoris ([place])   . Atypical chest pain 03/20/2015  . Carotid artery disease ([place]) 10/09/2014  . PVD (peripheral vascular disease) ([place]) 10/09/2014  . HTN (hypertension) 05/30/2014  . Arthritis of left hip 01/09/2014  . Status post THR (total hip replacement)  01/09/2014  . Spinal stenosis, lumbar 01/06/2013    Class: Diagnosis of  . Atherosclerosis of native artery of extremity with intermittent claudication ([place]) 10/18/2012    Past Surgical History:  Procedure Laterality Date  . APPENDECTOMY    . BACK SURGERY     5 times  . big toe surgery    . CARDIAC CATHETERIZATION     2010    dr Acie Fredrickson  . cataract surgery     left eye  . CHOLECYSTECTOMY N/A 07/27/2016   Procedure: LAPAROSCOPIC CHOLECYSTECTOMY;  Surgeon: Mickeal Skinner, MD;  Location: [facility];  Service: General;  Laterality: N/A;  . COLONOSCOPY    . CORONARY ARTERY BYPASS GRAFT N/A 04/05/2015   Procedure: CORONARY ARTERY BYPASS GRAFTING (CABG)X4 LIMA-LAD; SVG-DIAG1-DIAG2; SVG-PD;  Surgeon: [name], MD;  Location: [facility];  Service: Open Heart Surgery;  Laterality: N/A;  . CYSTOSCOPY    . ENDARTERECTOMY Left 04/24/2016   Procedure: ENDARTERECTOMY LEFT CAROTID;  Surgeon: Mal Misty, MD;  Location: [facility];  Service: Vascular;  Laterality: Left;  . EYE SURGERY    . FEMORAL ARTERY - POPLITEAL ARTERY BYPASS GRAFT    . JOINT REPLACEMENT     shoulder  . LEFT HEART CATHETERIZATION WITH CORONARY ANGIOGRAM N/A 04/03/2015   Procedure: LEFT HEART CATHETERIZATION WITH CORONARY ANGIOGRAM;  Surgeon: Troy Sine, MD;  Location: [facility] CATH LAB;  Service: Cardiovascular;  Laterality: N/A;  . LUMBAR LAMINECTOMY  01/06/2013   Procedure: MICRODISCECTOMY LUMBAR LAMINECTOMY;  Surgeon: Marybelle Killings, MD;  Location: [facility];  Service: Orthopedics;  Laterality: N/A;  L3-4 decompression  . LUMBAR LAMINECTOMY/DECOMPRESSION MICRODISCECTOMY  02/12/2012   Procedure: LUMBAR LAMINECTOMY/DECOMPRESSION MICRODISCECTOMY;  Surgeon: Floyce Stakes, MD;  Location: [facility];  Service: Neurosurgery;  Laterality: N/A;  Lumbar four-five laminectomy  . PATCH ANGIOPLASTY Left 04/24/2016   Procedure: LEFT CAROTID ARTERY PATCH ANGIOPLASTY;  Surgeon: Mal Misty, MD;  Location: [facility];  Service: Vascular;  Laterality:  Left;  . STERIOD INJECTION Right 01/09/2014   Procedure: STEROID INJECTION;  Surgeon: Mcarthur Rossetti, MD;  Location: [facility];  Service: Orthopedics;  Laterality: Right;  . TEE WITHOUT CARDIOVERSION N/A 04/05/2015   Procedure: TRANSESOPHAGEAL ECHOCARDIOGRAM (TEE);  Surgeon: [name], MD;  Location: [facility];  Service: Open Heart Surgery;  Laterality: N/A;  . TOTAL HIP ARTHROPLASTY Left 01/09/2014   DR Ninfa Linden  . TOTAL HIP ARTHROPLASTY Left 01/09/2014   Procedure: LEFT TOTAL HIP ARTHROPLASTY ANTERIOR APPROACH and Steroid Injection Right hip;  Surgeon: Mcarthur Rossetti, MD;  Location: [facility];  Service: Orthopedics;  Laterality: Left;        Home Medications    Prior to Admission medications   Medication Sig Start Date End Date Taking? Authorizing Provider  apixaban (ELIQUIS) 5 MG TABS  tablet Take 1 tablet (5 mg total) by mouth 2 (two) times daily. 04/23/18   Martinique, Peter M, MD  bisoprolol (ZEBETA) 5 MG tablet Take 0.5 tablets (2.5 mg total) by mouth daily. 02/10/18   Kayleen Memos, DO  furosemide (LASIX) 20 MG tablet Take 1 tablet (20 mg total) by mouth daily. 02/10/18   Kayleen Memos, DO  HYDROcodone-acetaminophen (NORCO/VICODIN) 5-325 MG tablet Take 1 tablet by mouth every 12 (twelve) hours as needed for moderate pain or severe pain (pain). 02/08/18   Rai, Vernelle Emerald, MD  isosorbide mononitrate (IMDUR) 30 MG 24 hr tablet Take 1 tablet (30 mg total) by mouth daily. 04/20/18   [name], [name], MD  levothyroxine (UNITHROID) 50 MCG tablet Take 50 mcg by mouth daily before breakfast. "Unithroid"    [provider]  meclizine (ANTIVERT) 25 MG tablet Take 25 mg by mouth 3 (three) times daily as needed for dizziness.  01/06/18   [provider]  nitroGLYCERIN (NITROSTAT) 0.4 MG SL tablet Place 1 tablet (0.4 mg total) under the tongue every 5 (five) minutes as needed for chest pain (CP or SOB). 02/09/18   Kayleen Memos, DO  potassium chloride SA (K-DUR,KLOR-CON) 20 MEQ  tablet Take 2 tablets (40 mEq total) by mouth daily. Patient taking differently: Take 20 mEq by mouth daily.  05/06/17   [name], [name], MD  senna (SENOKOT) 8.6 MG TABS tablet Take 1 tablet (8.6 mg total) by mouth daily. 02/09/18   Kayleen Memos, DO  tamsulosin (FLOMAX) 0.4 MG CAPS capsule Take 0.4 mg by mouth at bedtime. 03/01/17   [provider]    Family History Family History  Problem Relation Age of Onset  . Heart disease Father   . Heart attack Father   . Heart disease Sister   . Hypertension Sister   . Heart attack Sister   . Hypertension Mother   . Diabetes Son     Social History Social History   Tobacco Use  . Smoking status: Former Smoker    Types: Cigarettes    Last attempt to quit: 02/04/1987    Years since quitting: 31.2  . Smokeless tobacco: Former Systems developer    Types: Chew    Quit date: 07/20/2009  . Tobacco comment: quit 35+yrs ago  Substance Use Topics  . Alcohol use: No    Alcohol/week: 0.0 oz  . Drug use: No     Allergies   Codeine and Zetia [ezetimibe]   Review of Systems Review of Systems  Constitutional: Negative for chills and fever.  HENT: Negative for ear pain and sore throat.   Eyes: Negative for pain and visual disturbance.  Respiratory: Negative for cough, hemoptysis and shortness of breath.   Cardiovascular: Positive for chest pain. Negative for palpitations.  Gastrointestinal: Negative for abdominal pain, nausea and vomiting.  Genitourinary: Negative for dysuria and hematuria.  Musculoskeletal: Positive for back pain. Negative for arthralgias.  Skin: Negative for color change and rash.  Neurological: Positive for dizziness. Negative for seizures and syncope.  All other systems reviewed and are negative.    Physical Exam Updated Vital Signs BP 134/73 (BP Location: Left Arm)   Pulse (!) 57   Temp 98 F (36.7 C)   Resp 17   SpO2 97%   Physical Exam  Constitutional: He appears well-developed and well-nourished.  HENT:    Head: Normocephalic and atraumatic.  Eyes: Conjunctivae are normal.  Neck: Neck supple.  Cardiovascular: Normal rate and regular rhythm.  No  murmur heard. Pulmonary/Chest: Effort normal and breath sounds normal. No respiratory distress.  Abdominal: Soft. There is no tenderness.  Musculoskeletal: He exhibits no edema.       Right lower leg: Normal. He exhibits no tenderness and no edema.       Left lower leg: Normal. He exhibits no tenderness and no edema.  Neurological: He is alert.  Skin: Skin is warm and dry. Capillary refill takes less than 2 seconds.  Psychiatric: He has a normal mood and affect.  Nursing note and vitals reviewed.    ED Treatments / Results  Labs (all labs ordered are listed, but only abnormal results are displayed) Labs Reviewed  BASIC METABOLIC PANEL - Abnormal; Notable for the following components:      Result Value   Glucose, Bld 128 (*)    Creatinine, Ser 1.43 (*)    Calcium 8.6 (*)    GFR calc non Af Amer 46 (*)    GFR calc Af Amer 53 (*)    All other components within normal limits  CBC  I-STAT TROPONIN, ED  I-STAT TROPONIN, ED    EKG EKG Interpretation  Date/Time:  Sunday Apr 24 2018 06:35:43 EDT Ventricular Rate:  63 PR Interval:  218 QRS Duration: 86 QT Interval:  470 QTC Calculation: 480 R Axis:   17 Text Interpretation:  Sinus rhythm with 1st degree A-V block Prolonged QT Abnormal ECG similar to prior 2/19 Confirmed by Aletta Edouard [phone]) on 04/24/2018 7:57:50 AM   Radiology Dg Chest 2 View  Result Date: 04/24/2018 CLINICAL DATA:  Chest pain EXAM: CHEST - 2 VIEW COMPARISON:  02/07/2018 chest radiograph. FINDINGS: Intact sternotomy wires. Stable partially visualized left shoulder arthroplasty with broken screw overlying the left lateral scapular body. Stable cardiomediastinal silhouette with top-normal heart size. No pneumothorax. No pleural effusion. Lungs appear clear, with no acute consolidative airspace disease and no pulmonary  edema. Surgical clips again overlie the midline upper abdomen. IMPRESSION: No active cardiopulmonary disease. Electronically Signed   By: Ilona Sorrel M.D.   On: 04/24/2018 07:51    Procedures Procedures (including critical care time)  Medications Ordered in ED Medications  morphine 4 MG/ML injection 4 mg (has no administration in time range)  ondansetron (ZOFRAN) injection 4 mg (has no administration in time range)     Initial Impression / Assessment and Plan / ED Course  I have reviewed the triage vital signs and the nursing notes.  Pertinent labs & imaging results that were available during my care of the patient were reviewed by me and considered in my medical decision making (see chart for details).  Clinical Course as of Apr 25 806  Sun Apr 24, 2018  0834 Discussed with Dr. Bronson Ing from cardiology.  He reviewed the patient's notes and felt that he was just medical management.  He noted that the patient was recently just started on Imdor and to make sure the patient was taking it.  He felt that the patient could be discharged if we can get his symptoms under control.   [MB]  [id number] Reevaluated patient, he is pain-free right now.  I updated him that he can eat and drink and will recheck another troponin in 3 hours to see if its rising.   [MB]  1135 Patient second troponin is negative.  He is still pain-free.  I recommended that if he is comfortable with going home he continues regular medications and call Dr. Sheralyn Boatman office on Monday to get further recommendations.   [MB]  1219 Patient is pain-free here.  He understands that he needs to contact his cardiologist tomorrow for close follow-up.  He will continue his regular medicines and return if any worsening symptoms.   [MB]    Clinical Course User Index [MB] Hayden Rasmussen, MD     Final Clinical Impressions(s) / ED Diagnoses   Final diagnoses:  Acute angina [facility])    ED Discharge Orders    None       Hayden Rasmussen, MD 04/25/18 [phone]

## 2018-04-25 ENCOUNTER — Ambulatory Visit: Payer: Medicare Other | Admitting: Physical Therapy

## 2018-04-25 ENCOUNTER — Encounter (HOSPITAL_COMMUNITY): Payer: Self-pay | Admitting: Cardiology

## 2018-04-25 ENCOUNTER — Telehealth: Payer: Self-pay | Admitting: Cardiovascular Disease

## 2018-04-25 NOTE — Telephone Encounter (Signed)
New message  Pt wife verbalized that she is returning call for RN   (note: pt wife spelled) Rosuvastatin calcium 10mg  take half a tablet 3x week

## 2018-04-25 NOTE — Telephone Encounter (Signed)
Patient's medication list has been updated.  

## 2018-04-25 NOTE — Telephone Encounter (Signed)
Spoke with patient's wife who states patient has some questions about his cardiac cath. Patient came to the phone and states he was told he had 1 good artery open. He states he did not get a lot of information after the cath and was told to follow-up with Dr. Acie Fredrickson. He went back to the ED yesterday for pain; states he has had shoulder pain x 2 days, hx bursitis and hx acid reflux. He states he thinks that the pain may have been from anxiety because he is very nervous about the condition of his heart. I advised him that it is important to take all of his medications as prescribed and to follow heart healthy diet. I advised him of the importance of Imdur and that I will forward note to Dr. Acie Fredrickson for any additional advice. He states he is scheduled to fly to [place] next week and would like to make certain he is safe to fly. I advised that there are no contraindications to flying but that I will forward to Dr. Acie Fredrickson for reassurance and for plan for follow-up. Patient states I have answered his questions and given him relief of some of the anxiety he was feeling. He is aware I will call back with any additional advice from Dr. Acie Fredrickson and was thankful for my call.

## 2018-04-25 NOTE — Telephone Encounter (Signed)
Reviewed Dr. Elmarie Shiley advice with patient that he can fly to [place]. I explained medical therapy for his CAD to patient and answered questions to his satisfaction. He states he has restarted his cholesterol medication but he does not know the name of the medicine. I asked him to have his wife call back to give [place] the medication name so that we can update his chart. He verbalized understanding and agreement and asked me to cancel his appointment with [name], PA for Monday 5/13. He is aware of his appointment with Dr. Acie Fredrickson on 8/1 and will call back sooner with questions or concerns.

## 2018-04-25 NOTE — Telephone Encounter (Signed)
NEW MESSAGE    Patient requesting to speak with nurse. Seen in the ED over the weekend. Patient has concerns about dizziness.   STAT if patient feels like he/she is going to faint   1) Are you dizzy now?NO  2) Do you feel faint or have you passed out? NO  3) Do you have any other symptoms? Shoulder blade pain  Have you checked your HR and BP (record if available)? NO

## 2018-04-25 NOTE — Telephone Encounter (Signed)
I have reviewed the cardiac cath angiograms. Patient has severe native coronary artery disease.  He has a patent LIMA to LAD graft.  He has collateral filling to his right coronary artery.  The circumflex system has some disease but with good antegrade flow.  There are no contraindications for him to fly to [place] next week. Continue medical therapy for now.

## 2018-04-26 MED FILL — Heparin Sod (Porcine)-NaCl IV Soln 1000 Unit/500ML-0.9%: INTRAVENOUS | Qty: 1000 | Status: AC

## 2018-04-26 MED FILL — Verapamil HCl IV Soln 2.5 MG/ML: INTRAVENOUS | Qty: 2 | Status: AC

## 2018-04-27 ENCOUNTER — Ambulatory Visit: Payer: Medicare Other | Admitting: Physical Therapy

## 2018-05-02 ENCOUNTER — Ambulatory Visit: Payer: Medicare Other | Admitting: Physician Assistant

## 2018-05-23 ENCOUNTER — Encounter: Payer: Medicare Other | Admitting: Gastroenterology

## 2018-06-11 ENCOUNTER — Other Ambulatory Visit: Payer: Self-pay | Admitting: Cardiovascular Disease

## 2018-06-13 DIAGNOSIS — I10 Essential (primary) hypertension: Secondary | ICD-10-CM | POA: Diagnosis not present

## 2018-06-13 DIAGNOSIS — I7389 Other specified peripheral vascular diseases: Secondary | ICD-10-CM | POA: Diagnosis not present

## 2018-06-13 DIAGNOSIS — N400 Enlarged prostate without lower urinary tract symptoms: Secondary | ICD-10-CM | POA: Diagnosis not present

## 2018-06-13 DIAGNOSIS — H8113 Benign paroxysmal vertigo, bilateral: Secondary | ICD-10-CM | POA: Diagnosis not present

## 2018-06-13 DIAGNOSIS — N183 Chronic kidney disease, stage 3 (moderate): Secondary | ICD-10-CM | POA: Diagnosis not present

## 2018-06-13 DIAGNOSIS — I25810 Atherosclerosis of coronary artery bypass graft(s) without angina pectoris: Secondary | ICD-10-CM | POA: Diagnosis not present

## 2018-06-13 DIAGNOSIS — I480 Paroxysmal atrial fibrillation: Secondary | ICD-10-CM | POA: Diagnosis not present

## 2018-06-13 DIAGNOSIS — Z6828 Body mass index (BMI) 28.0-28.9, adult: Secondary | ICD-10-CM | POA: Diagnosis not present

## 2018-06-13 NOTE — Telephone Encounter (Signed)
Pt is a 77 yr old male who last saw Dr Acie Fredrickson on 04/20/18. Weight at that time was 78.9Kg. Last noted SCr was 1.43 on 04/24/18. Will refill Eliquis 5mg  BID.

## 2018-07-13 ENCOUNTER — Other Ambulatory Visit: Payer: Self-pay | Admitting: Cardiovascular Disease

## 2018-07-15 ENCOUNTER — Encounter (HOSPITAL_COMMUNITY): Payer: Medicare Other

## 2018-07-15 ENCOUNTER — Ambulatory Visit: Payer: Medicare Other | Admitting: Family

## 2018-07-21 ENCOUNTER — Ambulatory Visit (INDEPENDENT_AMBULATORY_CARE_PROVIDER_SITE_OTHER): Payer: Medicare Other | Admitting: Cardiovascular Disease

## 2018-07-21 ENCOUNTER — Encounter: Payer: Self-pay | Admitting: Cardiovascular Disease

## 2018-07-21 VITALS — BP 130/62 | HR 54 | Ht 66.0 in | Wt 172.1 lb

## 2018-07-21 DIAGNOSIS — E7800 Pure hypercholesterolemia, unspecified: Secondary | ICD-10-CM | POA: Diagnosis not present

## 2018-07-21 DIAGNOSIS — I209 Angina pectoris, unspecified: Secondary | ICD-10-CM | POA: Diagnosis not present

## 2018-07-21 DIAGNOSIS — I2510 Atherosclerotic heart disease of native coronary artery without angina pectoris: Secondary | ICD-10-CM

## 2018-07-21 DIAGNOSIS — I480 Paroxysmal atrial fibrillation: Secondary | ICD-10-CM

## 2018-07-21 NOTE — Patient Instructions (Addendum)

## 2018-07-21 NOTE — Progress Notes (Signed)
Problem List  1. CAD - CABG  2. Essential Hypertension 3. Hyperlipidemia 4. Left carotid arterectomy  5. Atrial fib  6. Gout    Ryan [name] is a 77 y.o. man  With a history of hyperlipidemia, coronary artery disease, hypothyroidism  He was admitted March 31 with CP and dyspnea.   VQ was intermediate.   Was clinically thought to have a PE  So he was DC'd on NOAC. Was readmitted several days later with STEMI and cath revealed severe CAD. CT angio on April 16 showed no evidence of pulmonary embolus.   Oct. 4, 2016:  Ryan [name] is doing well.  Occasional episodes of orthostatic hypotension Is eating some salty snacks. Has lots of hip pain with walking  Encouraged him to try a stationary bike or elliptical  Had peripheral arterial duplex scan ( ? At [place]) that was reportedly normal.   March 17, 2016  Thought he had some symptoms of food poisoning - occurred after eating .  Also may have developed back issues related to taking the crestor also  He stopped the crestor and the symptoms have resolved.   June 24, 2016:  Ryan [name] is seen today .   Spent the weekend at [facility]. - has a house there.  No CP or dyspnea.      March 10, 2017:  Ryan [name] is seen back after a recent hospitalization for new atrial fib .   Was started on amio and Eliquis  The echo shows an EF of 45%.  Moderate AS   Gets nausea after taking one of his meds.  Is now on Amio 400 BID,  Was also found to have gout  - was started on colchicine - feels better.  Is having some diarrhea from the colchicine   Is back in NSR   Aug. 14, 2018:  Ryan [name] is seen today for follow up of his CAD and atrial fib. He was hospitalized in April with urinary tract infection/pyelonephritis. He had mildly elevated troponin levels at that time. He also had acute kidney injury with a creatinine of 2.31 on admission. Creatinine has improved to 1.41  Had mildly elevated troponin levels at that time I had talked to Dr. Broadus John  about doing a Leane Call .   Still gets fatigued frequently . Has claudication .  Used to see Dr. Kellie Simmering.     Apr 20, 2018: Ryan [name] was admitted to the hospital in February, 2019 with chest pain. Took 1 sublingual nitroglycerin and 1 hydrocodone which relieved his chest pain.  Troponin levels were mildly elevated at 0.07, 0.08. Stress Myoview revealed a very small area of inferior ischemia. She did well and we decided to treat him medically.  He did not want to have a heart catheterization at that time.  He seems to have mildly elevated troponin levels on a chronic basis.  Has had recurrent CP  - one night he thought he would need to go to the hospital . He has stopped his rosuvastatin  and is taking just 1/2 of a Bisoprolol .  Has had progressive shortness of breath  Now he wants to have a cath   Aug. 1, 2019:  Ryan [name] is seen today for follow up visit .  No recent episodes of CP  Has lots of bruising / bleeding  - due to Eliquis therapy Has not been taking his synthroid for the past several months     Medications Current Outpatient Medications  Medication Sig Dispense Refill  . amiodarone (  PACERONE) 200 MG tablet Take 1 tablet by mouth daily.    Marland Kitchen apixaban (ELIQUIS) 5 MG TABS tablet Take 1 tablet (5 mg total) by mouth 2 (two) times daily. 60 tablet 11  . bisoprolol (ZEBETA) 5 MG tablet TAKE ONE-HALF TABLET BY MOUTH DAILY 45 tablet 3  . furosemide (LASIX) 20 MG tablet Take 1 tablet (20 mg total) by mouth daily. 30 tablet 0  . HYDROcodone-acetaminophen (NORCO/VICODIN) 5-325 MG tablet Take 1 tablet by mouth every 12 (twelve) hours as needed for moderate pain or severe pain (pain). 10 tablet 0  . isosorbide mononitrate (IMDUR) 30 MG 24 hr tablet Take 1 tablet (30 mg total) by mouth daily. 90 tablet 3  . levothyroxine (UNITHROID) 50 MCG tablet Take 50 mcg by mouth daily before breakfast. "Unithroid"    . meclizine (ANTIVERT) 25 MG tablet Take 25 mg by mouth 3 (three) times daily as  needed for dizziness.     . nitroGLYCERIN (NITROSTAT) 0.4 MG SL tablet Place 1 tablet (0.4 mg total) under the tongue every 5 (five) minutes as needed for chest pain (CP or SOB). 20 tablet 0  . potassium chloride SA (K-DUR,KLOR-CON) 20 MEQ tablet Take 2 tablets (40 mEq total) by mouth daily. (Patient taking differently: Take 20 mEq by mouth daily. ) 180 tablet 3  . rosuvastatin (CRESTOR) 10 MG tablet Take 10 mg by mouth 3 (three) times a week.     . senna (SENOKOT) 8.6 MG TABS tablet Take 1 tablet (8.6 mg total) by mouth daily. 30 each 0  . tamsulosin (FLOMAX) 0.4 MG CAPS capsule Take 0.4 mg by mouth at bedtime.     No current facility-administered medications for this visit.    Review of systems: Noted in current history, otherwise review of systems is negative.  Physical Exam: Blood pressure 130/62, pulse (!) 54, height 5\' 6"  (1.676 m), weight 172 lb 1.9 oz (78.1 kg), SpO2 95 %.  GEN:   Elderly gentleman, no acute distress HEENT: Normal NECK: No JVD; No carotid bruits LYMPHATICS: No lymphadenopathy CARDIAC: RR, no murmurs, rubs, gallops RESPIRATORY:  Clear to auscultation without rales, wheezing or rhonchi  ABDOMEN: Soft, non-tender, non-distended MUSCULOSKELETAL:  No edema; No deformity  SKIN: Warm and dry NEUROLOGIC:  Alert and oriented x 3    ECG :        Lab Results:  No results for input(s): WBC, HGB, HCT, PLT in the last 72 hours. BMET No results for input(s): NA, K, CL, CO2, GLUCOSE, BUN, CREATININE, CALCIUM in the last 72 hours.  Assessment/Plan    1. Paroxysmal Atrial fib:    - clinically is maintaining NSR   2. CAD:   Has had CABG .   Stable CP pattern     3. Hyperlipidemia -       Could not afford PCSK9 inhibitor   4.  Hypothyroidism:   Stopped taking his synthroid for some reason .   restarte last night   5..  Essential hypertension-   blood pressure is fairly well controlled.    retrun in 6 months   Thayer Headings, Brooke Bonito., MD, [facility] 07/21/2018, 10:10  AM [address],  [place] Pager [phone]

## 2018-07-28 DIAGNOSIS — E7849 Other hyperlipidemia: Secondary | ICD-10-CM | POA: Diagnosis not present

## 2018-07-28 DIAGNOSIS — I10 Essential (primary) hypertension: Secondary | ICD-10-CM | POA: Diagnosis not present

## 2018-07-28 DIAGNOSIS — I480 Paroxysmal atrial fibrillation: Secondary | ICD-10-CM | POA: Diagnosis not present

## 2018-07-28 DIAGNOSIS — E038 Other specified hypothyroidism: Secondary | ICD-10-CM | POA: Diagnosis not present

## 2018-07-28 DIAGNOSIS — I5020 Unspecified systolic (congestive) heart failure: Secondary | ICD-10-CM | POA: Diagnosis not present

## 2018-07-28 DIAGNOSIS — Z7901 Long term (current) use of anticoagulants: Secondary | ICD-10-CM | POA: Diagnosis not present

## 2018-07-28 DIAGNOSIS — R7301 Impaired fasting glucose: Secondary | ICD-10-CM | POA: Diagnosis not present

## 2018-07-28 DIAGNOSIS — H8110 Benign paroxysmal vertigo, unspecified ear: Secondary | ICD-10-CM | POA: Diagnosis not present

## 2018-07-28 DIAGNOSIS — N183 Chronic kidney disease, stage 3 (moderate): Secondary | ICD-10-CM | POA: Diagnosis not present

## 2018-07-28 DIAGNOSIS — Z6828 Body mass index (BMI) 28.0-28.9, adult: Secondary | ICD-10-CM | POA: Diagnosis not present

## 2018-07-28 DIAGNOSIS — I25810 Atherosclerosis of coronary artery bypass graft(s) without angina pectoris: Secondary | ICD-10-CM | POA: Diagnosis not present

## 2018-08-04 ENCOUNTER — Telehealth: Payer: Self-pay

## 2018-08-04 ENCOUNTER — Telehealth: Payer: Self-pay | Admitting: Cardiovascular Disease

## 2018-08-04 MED ORDER — FUROSEMIDE 20 MG PO TABS: 20.0000 mg | ORAL_TABLET | Freq: Every day | ORAL | 3 refills | Status: DC

## 2018-08-04 NOTE — Telephone Encounter (Signed)
Spoke with patient who states he tried to refill his Lasix and was told by the pharmacy that the doctor had d/c'ed it. I reviewed patient's chart and advised that when he saw Dr. Acie Fredrickson on 8/1 that Dr. Acie Fredrickson thought patient was taking Lasix 20 mg daily. Patient states he has had Lasix 40 mg tablets but cannot get a refill. I advised that this dose was d/c'ed by Dr. [place] Crane. He states that was a doctor he saw while hospitalized. Patient states he can tell a difference in his SOB when he does not take Lasix; states he has not taken it over the last weeks. I advised him that I will send refill of Lasix 20 mg daily since this is the dose Dr. Acie Fredrickson documented that the patient was taking on 8/1. I advised patient to monitor SOB, weight and swelling and call back if these symptoms worsen after taking Lasix 20 mg daily for several days. Patient verbalized understanding and agreement and thanked me for the call.

## 2018-08-04 NOTE — Telephone Encounter (Signed)
Patients wife called, stating that he needs a refill on his furosemide 40mg  once daily.  Patient's wife confirms that the bottle reads furosemide 40mg .  The sig on file says furosemide 20 mg daily.  Patient was last seen 07/21/18 by Dr. Acie Fredrickson.  Please advise.     furosemide (LASIX) 20 MG tablet Take 1 tablet (20 mg total) by mouth daily. 30 tablet 0

## 2018-08-04 NOTE — Telephone Encounter (Signed)
New Message         *STAT* If patient is at the pharmacy, call can be transferred to refill team.   1. Which medications need to be refilled? (please list name of each medication and dose if known) Furosemide 40 mg  2. Which pharmacy/location (including street and city if local pharmacy) is medication to be sent to?[name] [address]  3. Do they need a 30 day or 90 day supply? 90      Pt c/o Shortness Of Breath: STAT if SOB developed within the last 24 hours or pt is noticeably SOB on the phone  1. Are you currently SOB (can you hear that pt is SOB on the phone)? No  2. How long have you been experiencing SOB? 1 week   3. Are you SOB when sitting or when up moving around? Both   4. Are you currently experiencing any other symptoms? No

## 2018-08-04 NOTE — Telephone Encounter (Signed)
Furosemide 20 mg refill complete, see telephone encounter

## 2018-08-26 ENCOUNTER — Encounter (HOSPITAL_COMMUNITY): Payer: Medicare Other

## 2018-08-26 ENCOUNTER — Ambulatory Visit: Payer: Medicare Other | Admitting: Family

## 2018-09-20 DIAGNOSIS — Z23 Encounter for immunization: Secondary | ICD-10-CM | POA: Diagnosis not present

## 2018-09-26 ENCOUNTER — Telehealth: Payer: Self-pay | Admitting: Cardiovascular Disease

## 2018-09-26 DIAGNOSIS — I5020 Unspecified systolic (congestive) heart failure: Secondary | ICD-10-CM | POA: Diagnosis not present

## 2018-09-26 DIAGNOSIS — Z6828 Body mass index (BMI) 28.0-28.9, adult: Secondary | ICD-10-CM | POA: Diagnosis not present

## 2018-09-26 DIAGNOSIS — M25512 Pain in left shoulder: Secondary | ICD-10-CM | POA: Diagnosis not present

## 2018-09-26 DIAGNOSIS — S0093XA Contusion of unspecified part of head, initial encounter: Secondary | ICD-10-CM | POA: Diagnosis not present

## 2018-09-26 DIAGNOSIS — W19XXXA Unspecified fall, initial encounter: Secondary | ICD-10-CM | POA: Diagnosis not present

## 2018-09-26 DIAGNOSIS — I10 Essential (primary) hypertension: Secondary | ICD-10-CM | POA: Diagnosis not present

## 2018-09-26 NOTE — Telephone Encounter (Signed)
Pt is on eliquis. Apparently he has fallen and hit his head. Agree with going to the ER for further evaluation We may need to consider stopping his Eliquis if he continues to fall

## 2018-09-26 NOTE — Telephone Encounter (Signed)
New Message:    STAT if patient feels like he/she is going to faint   1) Are you dizzy now? No   2) Do you feel faint or have you passed out? No  3) Do you have any other symptoms? No   4) Have you checked your HR and BP (record if available)? No     Pt c/o medication issue:  1. Name of Medication: furosemide (LASIX) 20 MG tablet and bisoprolol (ZEBETA) 5 MG tablet  2. How are you currently taking this medication (dosage and times per day)?  Take 1 tablet (20 mg total) by mouth daily and TAKE ONE-HALF TABLET BY MOUTH DAILY  3. Are you having a reaction (difficulty breathing--STAT)? No   4. What is your medication issue? Patient is has fallen 3 times and believe one or both of the above medication is causing him to fall.

## 2018-09-26 NOTE — Telephone Encounter (Signed)
Called patient who states that he has fallen down 3 times in the last few weeks. He states he fell in the driveway yesterday and hit his head above his eyebrow. He has not had medical evaluation. I advised him he needs to go the hospital for examination of his head due to increased risk of bleeding from taking eliquis. Patient argues that he wants to see if [name] can see him today. He states he got dizzy in the shower the other day but did not fall; states he fell another time in the grass in his yard. I advised that he does not need to waste time waiting for an appointment and that my advice is to let his wife drive him to the hospital. He verbalized understanding and thanked me for the call.

## 2018-10-07 ENCOUNTER — Other Ambulatory Visit: Payer: Self-pay | Admitting: Cardiovascular Disease

## 2018-10-17 ENCOUNTER — Encounter: Payer: Self-pay | Admitting: Gastroenterology

## 2018-10-17 ENCOUNTER — Ambulatory Visit (INDEPENDENT_AMBULATORY_CARE_PROVIDER_SITE_OTHER): Payer: Medicare Other | Admitting: Family

## 2018-10-17 ENCOUNTER — Encounter: Payer: Self-pay | Admitting: Family

## 2018-10-17 ENCOUNTER — Ambulatory Visit (INDEPENDENT_AMBULATORY_CARE_PROVIDER_SITE_OTHER)
Admission: RE | Admit: 2018-10-17 | Discharge: 2018-10-17 | Disposition: A | Discharge status: Home / Self Care | Payer: Medicare Other | Source: Ambulatory Visit | Attending: Vascular Surgery | Admitting: Vascular Surgery

## 2018-10-17 ENCOUNTER — Ambulatory Visit (HOSPITAL_COMMUNITY)
Admission: RE | Admit: 2018-10-17 | Discharge: 2018-10-17 | Disposition: A | Discharge status: Home / Self Care | Payer: Medicare Other | Source: Ambulatory Visit | Attending: Vascular Surgery | Admitting: Vascular Surgery

## 2018-10-17 VITALS — BP 150/73 | HR 61 | Temp 96.8°F | Resp 17 | Ht 66.0 in | Wt 173.0 lb

## 2018-10-17 DIAGNOSIS — I739 Peripheral vascular disease, unspecified: Secondary | ICD-10-CM | POA: Diagnosis not present

## 2018-10-17 DIAGNOSIS — Z9889 Other specified postprocedural states: Secondary | ICD-10-CM | POA: Diagnosis not present

## 2018-10-17 DIAGNOSIS — I6522 Occlusion and stenosis of left carotid artery: Secondary | ICD-10-CM | POA: Diagnosis not present

## 2018-10-17 DIAGNOSIS — I779 Disorder of arteries and arterioles, unspecified: Secondary | ICD-10-CM | POA: Diagnosis not present

## 2018-10-17 DIAGNOSIS — I6523 Occlusion and stenosis of bilateral carotid arteries: Secondary | ICD-10-CM

## 2018-10-17 NOTE — Progress Notes (Signed)
[facility] [place] HISTORY AND PHYSICAL   CC: Follow up extracranial carotid artery stenosis and aortoiliac occlusive disease    History of Present Illness:   [name] is a 77 y.o. male who is s/p left carotid endarterectomy on 04-24-16 by Dr. Kellie Simmering.  After 2-3 minutes walking both hips hurt, the pain radiates from his hips distally to his legs. He states he has had 5 back surgeries. The pain resolves with rest.  He denies any wounds in his feet or legs.  He has had a left hip replacement and this did not help the pain in his left hip.   He walks 5-10 minutes twice daily on his driveway.   He denies chest pain, denies dyspnea, denies cough, he has rhinitis.   Dr. Donzetta Matters last evaluated pt on 07-09-17. At that time left endarterectomy site was patent and no symptoms. He had apparent claudication in his bilateral hips with walking and a history of aortobifemoral bypass. He was to follow-up in one year with repeat carotid duplexes as well as ABIs.  Dr. Donzetta Matters offered pt CT scan to evaluate his aortobifemoral bypass as well as hypogastric flow but he was uninterested in any further procedures at that time. He was to continue his statin and blood thinning medications.   Diabetic: No Tobacco use: former smoker, quit in 1988, started in his teens, smoked about 30 years  Pt meds include: Statin :Yes Betablocker: Yes ASA: No Other anticoagulants/antiplatelets: Eliquis now, Xarelto started in 2016 for PE. He apparently also has atrial fib, not on his medical hx. Dr. Elmarie Shiley note from 07-21-18 indicates a diagnosis of atrial fib.    Past Medical History:  Diagnosis Date  . Arthritis   . CAD (coronary artery disease)   . Carotid artery occlusion   . Cataract    Bil eyes/worse in left eye  . CHF (congestive heart failure) ([place])   . Chronic back pain   . DVT (deep venous thrombosis) ([place])   . Dysrhythmia   . Enlarged prostate    takes Rapaflo daily  . GERD  (gastroesophageal reflux disease)    occasional  . History of colon polyps   . History of gout    has colchicine prn  . History of kidney stones   . Hyperlipidemia    takes Crestor daily  . Hypertension    takes Amlodipine daily  . Hypothyroidism   . Myocardial infarction ([place])   . Peripheral vascular disease ([place])   . Pulmonary emboli ([place]) 03/20/2015   elevated d-dimer, intermediate V/Q study, atypical chest pain and SOB. Start on Xarelto 20mg  BID for 3 month  . Renal insufficiency   . Shortness of breath dyspnea   . Urinary frequency   . Urinary urgency     Social History Social History   Tobacco Use  . Smoking status: Former Smoker    Types: Cigarettes    Last attempt to quit: 02/04/1987    Years since quitting: 31.7  . Smokeless tobacco: Former Systems developer    Types: Chew    Quit date: 07/20/2009  . Tobacco comment: quit 35+yrs ago  Substance Use Topics  . Alcohol use: No    Alcohol/week: 0.0 standard drinks  . Drug use: No    Family History Family History  Problem Relation Age of Onset  . Heart disease Father   . Heart attack Father   . Heart disease Sister   . Hypertension Sister   . Heart attack Sister   .  Hypertension Mother   . Diabetes Son     Surgical History Past Surgical History:  Procedure Laterality Date  . APPENDECTOMY    . BACK SURGERY     5 times  . big toe surgery    . CARDIAC CATHETERIZATION     2010    dr Acie Fredrickson  . cataract surgery     left eye  . CHOLECYSTECTOMY N/A 07/27/2016   Procedure: LAPAROSCOPIC CHOLECYSTECTOMY;  Surgeon: Mickeal Skinner, MD;  Location: [facility];  Service: General;  Laterality: N/A;  . COLONOSCOPY    . CORONARY ARTERY BYPASS GRAFT N/A 04/05/2015   Procedure: CORONARY ARTERY BYPASS GRAFTING (CABG)X4 LIMA-LAD; SVG-DIAG1-DIAG2; SVG-PD;  Surgeon: [name], MD;  Location: [facility];  Service: Open Heart Surgery;  Laterality: N/A;  . CORONARY/GRAFT ANGIOGRAPHY N/A 04/22/2018   Procedure: CORONARY/GRAFT ANGIOGRAPHY;   Surgeon: Martinique, Peter M, MD;  Location: [facility];  Service: Cardiovascular;  Laterality: N/A;  . CYSTOSCOPY    . ENDARTERECTOMY Left 04/24/2016   Procedure: ENDARTERECTOMY LEFT CAROTID;  Surgeon: Mal Misty, MD;  Location: [facility];  Service: Vascular;  Laterality: Left;  . EYE SURGERY    . FEMORAL ARTERY - POPLITEAL ARTERY BYPASS GRAFT    . JOINT REPLACEMENT     shoulder  . LEFT HEART CATHETERIZATION WITH CORONARY ANGIOGRAM N/A 04/03/2015   Procedure: LEFT HEART CATHETERIZATION WITH CORONARY ANGIOGRAM;  Surgeon: Troy Sine, MD;  Location[facility] CATH [facility];  Service: Cardiovascular;  Laterality: N/A;  . LUMBAR LAMINECTOMY  01/06/2013   Procedure: MICRODISCECTOMY LUMBAR LAMINECTOMY;  Surgeon: Marybelle Killings, MD;  Location: [facility];  Service: Orthopedics;  Laterality: N/A;  L3-4 decompression  . LUMBAR LAMINECTOMY/DECOMPRESSION MICRODISCECTOMY  02/12/2012   Procedure: LUMBAR LAMINECTOMY/DECOMPRESSION MICRODISCECTOMY;  Surgeon: Floyce Stakes, MD;  Location: [facility] NEURO ORS;  Service: Neurosurgery;  Laterality: N/A;  Lumbar four-five laminectomy  . PATCH ANGIOPLASTY Left 04/24/2016   Procedure: LEFT CAROTID ARTERY PATCH ANGIOPLASTY;  Surgeon: Mal Misty, MD;  Location: [facility];  Service: Vascular;  Laterality: Left;  . STERIOD INJECTION Right 01/09/2014   Procedure: STEROID INJECTION;  Surgeon: Mcarthur Rossetti, MD;  Location: [facility];  Service: Orthopedics;  Laterality: Right;  . TEE WITHOUT CARDIOVERSION N/A 04/05/2015   Procedure: TRANSESOPHAGEAL ECHOCARDIOGRAM (TEE);  Surgeon: [name], MD;  Location: [facility];  Service: Open Heart Surgery;  Laterality: N/A;  . TOTAL HIP ARTHROPLASTY Left 01/09/2014   DR Ninfa Linden  . TOTAL HIP ARTHROPLASTY Left 01/09/2014   Procedure: LEFT TOTAL HIP ARTHROPLASTY ANTERIOR APPROACH and Steroid Injection Right hip;  Surgeon: Mcarthur Rossetti, MD;  Location: [facility];  Service: Orthopedics;  Laterality: Left;    Allergies  Allergen Reactions  .  Codeine Nausea And Vomiting       . Zetia [Ezetimibe] Other (See Comments)    Myalgias     Current Outpatient Medications  Medication Sig Dispense Refill  . amiodarone (PACERONE) 200 MG tablet TAKE ONE TABLET BY MOUTH DAILY 90 tablet 2  . apixaban (ELIQUIS) 5 MG TABS tablet Take 1 tablet (5 mg total) by mouth 2 (two) times daily. 60 tablet 11  . bisoprolol (ZEBETA) 5 MG tablet TAKE ONE-HALF TABLET BY MOUTH DAILY 45 tablet 3  . furosemide (LASIX) 20 MG tablet Take 1 tablet (20 mg total) by mouth daily. 90 tablet 3  . HYDROcodone-acetaminophen (NORCO/VICODIN) 5-325 MG tablet Take 1 tablet by mouth every 12 (twelve) hours as needed for moderate pain or severe pain (pain).  10 tablet 0  . isosorbide mononitrate (IMDUR) 30 MG 24 hr tablet Take 1 tablet (30 mg total) by mouth daily. 90 tablet 3  . levothyroxine (UNITHROID) 50 MCG tablet Take 50 mcg by mouth daily before breakfast. "Unithroid"    . meclizine (ANTIVERT) 25 MG tablet Take 25 mg by mouth 3 (three) times daily as needed for dizziness.     . nitroGLYCERIN (NITROSTAT) 0.4 MG SL tablet Place 1 tablet (0.4 mg total) under the tongue every 5 (five) minutes as needed for chest pain (CP or SOB). 20 tablet 0  . potassium chloride SA (K-DUR,KLOR-CON) 20 MEQ tablet TAKE TWO TABLETS BY MOUTH DAILY 180 tablet 2  . rosuvastatin (CRESTOR) 10 MG tablet Take 10 mg by mouth 3 (three) times a week.     . senna (SENOKOT) 8.6 MG TABS tablet Take 1 tablet (8.6 mg total) by mouth daily. 30 each 0  . tamsulosin (FLOMAX) 0.4 MG CAPS capsule Take 0.4 mg by mouth at bedtime.     No current facility-administered medications for this visit.      REVIEW OF SYSTEMS: See HPI for pertinent positives and negatives.  Physical Examination Vitals:   10/17/18 1355 10/17/18 1400  BP: (!) 130/56 (!) 150/73  Pulse: 66 61  Resp: 17   Temp: (!) 96.8 F (36 C)   TempSrc: Oral   SpO2: 96% 95%  Weight: 173 lb (78.5 kg)   Height: 5\' 6"  (1.676 m)    Body mass index  is 27.92 kg/m.  General:  WDWN in NAD Gait: Normal HENT: WNL Eyes: Pupils equal Pulmonary: normal non-labored breathing, good air movement in all fields, CTAB, without Rales, rhonchi,  wheezing Cardiac: Irregular rhythm, no murmur detected Abdomen: soft, NT, no masses palpated Skin: no rashes, no ulcers, no cellulitis.   VASCULAR EXAM  Carotid Bruits Right Left   Negative Negative      Radial pulses are 2+ palpable bilaterally   Adominal aortic pulse is not palpable                      VASCULAR EXAM: Extremities without ischemic changes, without Gangrene; without open wounds.                                                                                                          LE Pulses Right Left       FEMORAL  3+ palpable  1+ palpable        POPLITEAL  2+ palpable   not palpable       POSTERIOR TIBIAL  not palpable   not palpable        DORSALIS PEDIS      ANTERIOR TIBIAL 2+ palpable  2+ palpable     Musculoskeletal: no muscle wasting or atrophy; no peripheral edema  Neurologic:  A&O X 3; appropriate affect, sensation is normal; speech is normal, CN 2-12 intact, pain and light touch intact in extremities, motor exam as listed above. Psychiatric: Normal thought content, mood appropriate to clinical situation.  ASSESSMENT:  [name] is a 77 y.o. male who is s/p left carotid endarterectomy on 04-24-16.  He has no history of stroke or TIA.   Bilateral hip pain with walking a few minutes: is not due to lack of arterial perfusion since his bilateral dorsalis pedis pulses are 2+ palpable. He has an appointment with his spine specialist soon.     DATA  Carotid Duplex (10-17-18): Right ICA: 40-59% stenosis Left ICA: CEA site with 1-39% stenosis Right ECA: >50% stenosis Bilateral vertebral artery flow is antegrade.  Bilateral subclavian artery waveforms are normal.  Slight increased stenosis of the bilateral ICA compared to the exam on 07-09-17.     ABI (Date: 10/17/2018):  R:   ABI: 0.83 (was 1.02 on 10-09-14),   PT: bi (was tri)  DP: mono (was tri)  TBI:  0.46, toe pressure 61, (was 0.70)  L:   ABI: 0.98 (was 1.10),   PT: bi (was tri)  DP: bi (was tri)  TBI: 0.56, toe pressure 74, (was 0.71) Decline in right ABI and bilateral TBI. Mild disease in the right LE.  Left ABI remains in the normal range.   PLAN:   Based on today's exam and non-invasive vascular lab results, the patient will follow up in 1 year with the following tests: carotid duplex and ABI's.   I discussed in depth with the patient the nature of atherosclerosis, and emphasized the importance of maximal medical management including strict control of blood pressure, blood glucose, and lipid levels, obtaining regular exercise, and continued cessation of smoking.  The patient is aware that without maximal medical management the underlying atherosclerotic disease process will progress, limiting the benefit of any interventions.  Consideration for repair of AAA would be made when the size approaches 5.0 cm, growth > 1 cm/yr, and symptomatic status. The patient was given information about AAA including signs, symptoms, treatment,  what symptoms should prompt the patient to seek immediate medical care, and how to minimize the risk of enlargement and rupture of aneurysms.   The patient was given information about stroke prevention and what symptoms should prompt the patient to seek immediate medical care.  The patient was given information about PAD including signs, symptoms, treatment, what symptoms should prompt the patient to seek immediate medical care, and risk reduction measures to take.  Thank you for allowing [place] to participate in this patient's care.  Clemon Chambers, RN, MSN, FNP-C [facility] Office: [phone]  Clinic MD: Trula Slade 10/17/2018 2:47 PM

## 2018-10-17 NOTE — Patient Instructions (Signed)
Stroke Prevention Some health problems and behaviors may make it more likely for you to have a stroke. Below are ways to lessen your risk of having a stroke.  Be active for at least 30 minutes on most or all days.  Do not smoke. Try not to be around others who smoke.  Do not drink too much alcohol. ? Do not have more than 2 drinks a day if you are a man. ? Do not have more than 1 drink a day if you are a woman and are not pregnant.  Eat healthy foods, such as fruits and vegetables. If you were put on a specific diet, follow the diet as told.  Keep your cholesterol levels under control through diet and medicines. Look for foods that are low in saturated fat, trans fat, cholesterol, and are high in fiber.  If you have diabetes, follow all diet plans and take your medicine as told.  Ask your doctor if you need treatment to lower your blood pressure. If you have high blood pressure (hypertension), follow all diet plans and take your medicine as told by your doctor.  If you are 18-39 years old, have your blood pressure checked every 3-5 years. If you are age 40 or older, have your blood pressure checked every year.  Keep a healthy weight. Eat foods that are low in calories, salt, saturated fat, trans fat, and cholesterol.  Do not take drugs.  Avoid birth control pills, if this applies. Talk to your doctor about the risks of taking birth control pills.  Talk to your doctor if you have sleep problems (sleep apnea).  Take all medicine as told by your doctor. ? You may be told to take aspirin or blood thinner medicine. Take this medicine as told by your doctor. ? Understand your medicine instructions.  Make sure any other conditions you have are being taken care of.  Get help right away if:  You suddenly lose feeling (you feel numb) or have weakness in your face, arm, or leg.  Your face or eyelid hangs down to one side.  You suddenly feel confused.  You have trouble talking  (aphasia) or understanding what people are saying.  You suddenly have trouble seeing in one or both eyes.  You suddenly have trouble walking.  You are dizzy.  You lose your balance or your movements are clumsy (uncoordinated).  You suddenly have a very bad headache and you do not know the cause.  You have new chest pain.  Your heart feels like it is fluttering or skipping a beat (irregular heartbeat). Do not wait to see if the symptoms above go away. Get help right away. Call your local emergency services (911 in [place] Do not drive yourself to the hospital. This information is not intended to replace advice given to you by your health care provider. Make sure you discuss any questions you have with your health care provider. Document Released: 06/07/2012 Document Revised: 05/14/2016 Document Reviewed: 06/09/2013 Elsevier Interactive Patient Education  2018 Elsevier Inc.     Peripheral Vascular Disease Peripheral vascular disease (PVD) is a disease of the blood vessels that are not part of your heart and brain. A simple term for PVD is poor circulation. In most cases, PVD narrows the blood vessels that carry blood from your heart to the rest of your body. This can result in a decreased supply of blood to your arms, legs, and internal organs, like your stomach or kidneys. However, it most often affects   a person's lower legs and feet. There are two types of PVD.  Organic PVD. This is the more common type. It is caused by damage to the structure of blood vessels.  Functional PVD. This is caused by conditions that make blood vessels contract and tighten (spasm).  Without treatment, PVD tends to get worse over time. PVD can also lead to acute ischemic limb. This is when an arm or limb suddenly has trouble getting enough blood. This is a medical emergency. Follow these instructions at home:  Take medicines only as told by your doctor.  Do not use any tobacco products, including  cigarettes, chewing tobacco, or electronic cigarettes. If you need help quitting, ask your doctor.  Lose weight if you are overweight, and maintain a healthy weight as told by your doctor.  Eat a diet that is low in fat and cholesterol. If you need help, ask your doctor.  Exercise regularly. Ask your doctor for some good activities for you.  Take good care of your feet. ? Wear comfortable shoes that fit well. ? Check your feet often for any cuts or sores. Contact a doctor if:  You have cramps in your legs while walking.  You have leg pain when you are at rest.  You have coldness in a leg or foot.  Your skin changes.  You are unable to get or have an erection (erectile dysfunction).  You have cuts or sores on your feet that are not healing. Get help right away if:  Your arm or leg turns cold and blue.  Your arms or legs become red, warm, swollen, painful, or numb.  You have chest pain or trouble breathing.  You suddenly have weakness in your face, arm, or leg.  You become very confused or you cannot speak.  You suddenly have a very bad headache.  You suddenly cannot see. This information is not intended to replace advice given to you by your health care provider. Make sure you discuss any questions you have with your health care provider. Document Released: 03/03/2010 Document Revised: 05/14/2016 Document Reviewed: 05/17/2014 Elsevier Interactive Patient Education  2017 Elsevier Inc.  

## 2018-11-21 ENCOUNTER — Encounter: Payer: Medicare Other | Admitting: *Deleted

## 2018-11-21 DIAGNOSIS — Z006 Encounter for examination for normal comparison and control in clinical research program: Secondary | ICD-10-CM

## 2018-11-21 NOTE — Research (Signed)
Subject to research clinic for screening visit in the [facility] 4 research trial.  Subject met inclusion and exclusion criteria.  The informed consent form, study requirements and expectations were reviewed with the subject and questions and concerns were addressed prior to the signing of the consent form.  The subject verbalized understanding of the trial requirements.  The subject agreed to participate in the [place] 4 trial and signed the informed consent.  The informed consent was obtained prior to performance of any protocol-specific procedures for the subject.  A copy of the signed informed consent was given to the subject and a copy was placed in the subject's medical record.  Subject was a screen fail for this trial with TC 141.  Would like to be kept in database for future trials.

## 2019-01-04 DIAGNOSIS — Z6828 Body mass index (BMI) 28.0-28.9, adult: Secondary | ICD-10-CM | POA: Diagnosis not present

## 2019-01-04 DIAGNOSIS — R350 Frequency of micturition: Secondary | ICD-10-CM | POA: Diagnosis not present

## 2019-01-04 DIAGNOSIS — J181 Lobar pneumonia, unspecified organism: Secondary | ICD-10-CM | POA: Diagnosis not present

## 2019-01-04 DIAGNOSIS — I10 Essential (primary) hypertension: Secondary | ICD-10-CM | POA: Diagnosis not present

## 2019-01-04 DIAGNOSIS — R05 Cough: Secondary | ICD-10-CM | POA: Diagnosis not present

## 2019-01-04 DIAGNOSIS — N390 Urinary tract infection, site not specified: Secondary | ICD-10-CM | POA: Diagnosis not present

## 2019-01-24 ENCOUNTER — Ambulatory Visit: Payer: Medicare Other | Admitting: Nurse Practitioner

## 2019-01-25 ENCOUNTER — Other Ambulatory Visit (HOSPITAL_COMMUNITY): Payer: Self-pay | Admitting: Respiratory Therapy

## 2019-01-25 DIAGNOSIS — E7849 Other hyperlipidemia: Secondary | ICD-10-CM | POA: Diagnosis not present

## 2019-01-25 DIAGNOSIS — R7301 Impaired fasting glucose: Secondary | ICD-10-CM | POA: Diagnosis not present

## 2019-01-25 DIAGNOSIS — Z6828 Body mass index (BMI) 28.0-28.9, adult: Secondary | ICD-10-CM | POA: Diagnosis not present

## 2019-01-25 DIAGNOSIS — M109 Gout, unspecified: Secondary | ICD-10-CM | POA: Diagnosis not present

## 2019-01-25 DIAGNOSIS — J181 Lobar pneumonia, unspecified organism: Secondary | ICD-10-CM | POA: Diagnosis not present

## 2019-01-25 DIAGNOSIS — Z125 Encounter for screening for malignant neoplasm of prostate: Secondary | ICD-10-CM | POA: Diagnosis not present

## 2019-01-25 DIAGNOSIS — E039 Hypothyroidism, unspecified: Secondary | ICD-10-CM | POA: Diagnosis not present

## 2019-01-25 DIAGNOSIS — M25511 Pain in right shoulder: Secondary | ICD-10-CM | POA: Diagnosis not present

## 2019-01-25 DIAGNOSIS — Z7901 Long term (current) use of anticoagulants: Secondary | ICD-10-CM | POA: Diagnosis not present

## 2019-01-25 DIAGNOSIS — R0609 Other forms of dyspnea: Principal | ICD-10-CM

## 2019-01-25 DIAGNOSIS — R82998 Other abnormal findings in urine: Secondary | ICD-10-CM | POA: Diagnosis not present

## 2019-01-25 DIAGNOSIS — R110 Nausea: Secondary | ICD-10-CM | POA: Diagnosis not present

## 2019-01-25 DIAGNOSIS — I10 Essential (primary) hypertension: Secondary | ICD-10-CM | POA: Diagnosis not present

## 2019-01-25 DIAGNOSIS — R0600 Dyspnea, unspecified: Secondary | ICD-10-CM

## 2019-01-25 DIAGNOSIS — I480 Paroxysmal atrial fibrillation: Secondary | ICD-10-CM | POA: Diagnosis not present

## 2019-01-27 ENCOUNTER — Inpatient Hospital Stay (HOSPITAL_COMMUNITY)
Admission: AD | Admit: 2019-01-27 | Discharge: 2019-01-31 | DRG: 286 | Disposition: A | Discharge status: Home / Self Care | Payer: Medicare Other | Attending: Cardiology | Admitting: Cardiology

## 2019-01-27 ENCOUNTER — Telehealth: Payer: Self-pay | Admitting: Physician Assistant

## 2019-01-27 ENCOUNTER — Encounter (HOSPITAL_COMMUNITY): Payer: Self-pay

## 2019-01-27 ENCOUNTER — Other Ambulatory Visit: Payer: Self-pay

## 2019-01-27 ENCOUNTER — Ambulatory Visit (INDEPENDENT_AMBULATORY_CARE_PROVIDER_SITE_OTHER): Payer: Medicare Other | Admitting: Physician Assistant

## 2019-01-27 ENCOUNTER — Observation Stay (HOSPITAL_COMMUNITY): Payer: Medicare Other

## 2019-01-27 ENCOUNTER — Telehealth: Payer: Self-pay

## 2019-01-27 ENCOUNTER — Encounter: Payer: Self-pay | Admitting: Physician Assistant

## 2019-01-27 VITALS — Ht 66.0 in | Wt 170.8 lb

## 2019-01-27 DIAGNOSIS — I200 Unstable angina: Secondary | ICD-10-CM | POA: Diagnosis not present

## 2019-01-27 DIAGNOSIS — I10 Essential (primary) hypertension: Secondary | ICD-10-CM

## 2019-01-27 DIAGNOSIS — Z8701 Personal history of pneumonia (recurrent): Secondary | ICD-10-CM | POA: Diagnosis not present

## 2019-01-27 DIAGNOSIS — I739 Peripheral vascular disease, unspecified: Secondary | ICD-10-CM | POA: Diagnosis not present

## 2019-01-27 DIAGNOSIS — Z8249 Family history of ischemic heart disease and other diseases of the circulatory system: Secondary | ICD-10-CM | POA: Diagnosis not present

## 2019-01-27 DIAGNOSIS — F419 Anxiety disorder, unspecified: Secondary | ICD-10-CM | POA: Diagnosis present

## 2019-01-27 DIAGNOSIS — E7800 Pure hypercholesterolemia, unspecified: Secondary | ICD-10-CM | POA: Diagnosis not present

## 2019-01-27 DIAGNOSIS — I130 Hypertensive heart and chronic kidney disease with heart failure and stage 1 through stage 4 chronic kidney disease, or unspecified chronic kidney disease: Secondary | ICD-10-CM | POA: Diagnosis not present

## 2019-01-27 DIAGNOSIS — N400 Enlarged prostate without lower urinary tract symptoms: Secondary | ICD-10-CM | POA: Diagnosis not present

## 2019-01-27 DIAGNOSIS — I25810 Atherosclerosis of coronary artery bypass graft(s) without angina pectoris: Secondary | ICD-10-CM | POA: Diagnosis not present

## 2019-01-27 DIAGNOSIS — E039 Hypothyroidism, unspecified: Secondary | ICD-10-CM | POA: Diagnosis present

## 2019-01-27 DIAGNOSIS — I779 Disorder of arteries and arterioles, unspecified: Secondary | ICD-10-CM

## 2019-01-27 DIAGNOSIS — Z833 Family history of diabetes mellitus: Secondary | ICD-10-CM

## 2019-01-27 DIAGNOSIS — E785 Hyperlipidemia, unspecified: Secondary | ICD-10-CM | POA: Diagnosis not present

## 2019-01-27 DIAGNOSIS — I4819 Other persistent atrial fibrillation: Secondary | ICD-10-CM | POA: Diagnosis present

## 2019-01-27 DIAGNOSIS — I5042 Chronic combined systolic (congestive) and diastolic (congestive) heart failure: Secondary | ICD-10-CM

## 2019-01-27 DIAGNOSIS — R42 Dizziness and giddiness: Secondary | ICD-10-CM

## 2019-01-27 DIAGNOSIS — I252 Old myocardial infarction: Secondary | ICD-10-CM | POA: Diagnosis not present

## 2019-01-27 DIAGNOSIS — I480 Paroxysmal atrial fibrillation: Secondary | ICD-10-CM | POA: Diagnosis present

## 2019-01-27 DIAGNOSIS — I25110 Atherosclerotic heart disease of native coronary artery with unstable angina pectoris: Secondary | ICD-10-CM

## 2019-01-27 DIAGNOSIS — R079 Chest pain, unspecified: Secondary | ICD-10-CM | POA: Diagnosis not present

## 2019-01-27 DIAGNOSIS — I25710 Atherosclerosis of autologous vein coronary artery bypass graft(s) with unstable angina pectoris: Secondary | ICD-10-CM | POA: Diagnosis not present

## 2019-01-27 DIAGNOSIS — I5043 Acute on chronic combined systolic (congestive) and diastolic (congestive) heart failure: Secondary | ICD-10-CM | POA: Diagnosis not present

## 2019-01-27 DIAGNOSIS — I2510 Atherosclerotic heart disease of native coronary artery without angina pectoris: Secondary | ICD-10-CM | POA: Diagnosis not present

## 2019-01-27 DIAGNOSIS — Z9049 Acquired absence of other specified parts of digestive tract: Secondary | ICD-10-CM

## 2019-01-27 DIAGNOSIS — I2583 Coronary atherosclerosis due to lipid rich plaque: Secondary | ICD-10-CM

## 2019-01-27 DIAGNOSIS — Z87891 Personal history of nicotine dependence: Secondary | ICD-10-CM | POA: Diagnosis not present

## 2019-01-27 DIAGNOSIS — N183 Chronic kidney disease, stage 3 unspecified: Secondary | ICD-10-CM

## 2019-01-27 DIAGNOSIS — Z96642 Presence of left artificial hip joint: Secondary | ICD-10-CM | POA: Diagnosis present

## 2019-01-27 DIAGNOSIS — I5032 Chronic diastolic (congestive) heart failure: Secondary | ICD-10-CM | POA: Diagnosis present

## 2019-01-27 DIAGNOSIS — Z79899 Other long term (current) drug therapy: Secondary | ICD-10-CM

## 2019-01-27 DIAGNOSIS — I25118 Atherosclerotic heart disease of native coronary artery with other forms of angina pectoris: Secondary | ICD-10-CM | POA: Diagnosis not present

## 2019-01-27 DIAGNOSIS — R05 Cough: Secondary | ICD-10-CM | POA: Diagnosis not present

## 2019-01-27 DIAGNOSIS — Z7901 Long term (current) use of anticoagulants: Secondary | ICD-10-CM | POA: Diagnosis not present

## 2019-01-27 DIAGNOSIS — I361 Nonrheumatic tricuspid (valve) insufficiency: Secondary | ICD-10-CM | POA: Diagnosis not present

## 2019-01-27 DIAGNOSIS — N1831 Chronic kidney disease, stage 3a: Secondary | ICD-10-CM

## 2019-01-27 DIAGNOSIS — R0602 Shortness of breath: Secondary | ICD-10-CM | POA: Diagnosis not present

## 2019-01-27 DIAGNOSIS — Z79890 Hormone replacement therapy (postmenopausal): Secondary | ICD-10-CM

## 2019-01-27 DIAGNOSIS — I5033 Acute on chronic diastolic (congestive) heart failure: Secondary | ICD-10-CM | POA: Diagnosis not present

## 2019-01-27 LAB — CBC WITH DIFFERENTIAL/PLATELET
Abs Immature Granulocytes: 0.02 10*3/uL (ref 0.00–0.07)
Basophils Absolute: 0 10*3/uL (ref 0.0–0.1)
Basophils Relative: 0 %
Eosinophils Absolute: 0.1 10*3/uL (ref 0.0–0.5)
Eosinophils Relative: 1 %
HCT: 40.9 % (ref 39.0–52.0)
HEMOGLOBIN: 13 g/dL (ref 13.0–17.0)
Immature Granulocytes: 0 %
Lymphocytes Relative: 19 %
Lymphs Abs: 1.4 10*3/uL (ref 0.7–4.0)
MCH: 30.5 pg (ref 26.0–34.0)
MCHC: 31.8 g/dL (ref 30.0–36.0)
MCV: 96 fL (ref 80.0–100.0)
Monocytes Absolute: 0.6 10*3/uL (ref 0.1–1.0)
Monocytes Relative: 8 %
NEUTROS PCT: 72 %
Neutro Abs: 5.3 10*3/uL (ref 1.7–7.7)
Platelets: 237 10*3/uL (ref 150–400)
RBC: 4.26 MIL/uL (ref 4.22–5.81)
RDW: 14.1 % (ref 11.5–15.5)
WBC: 7.5 10*3/uL (ref 4.0–10.5)
nRBC: 0 % (ref 0.0–0.2)

## 2019-01-27 LAB — COMPREHENSIVE METABOLIC PANEL
ALT: 14 U/L (ref 0–44)
AST: 24 U/L (ref 15–41)
Albumin: 3.5 g/dL (ref 3.5–5.0)
Alkaline Phosphatase: 47 U/L (ref 38–126)
Anion gap: 10 (ref 5–15)
BUN: 16 mg/dL (ref 8–23)
CO2: 25 mmol/L (ref 22–32)
CREATININE: 1.3 mg/dL — AB (ref 0.61–1.24)
Calcium: 9.2 mg/dL (ref 8.9–10.3)
Chloride: 106 mmol/L (ref 98–111)
GFR calc Af Amer: >60 mL/min (ref >60)
GFR calc non Af Amer: 53 mL/min — ABNORMAL LOW (ref >60)
Glucose, Bld: 131 mg/dL — ABNORMAL HIGH (ref 70–99)
Potassium: 4.7 mmol/L (ref 3.5–5.1)
SODIUM: 141 mmol/L (ref 135–145)
Total Bilirubin: 1.3 mg/dL — ABNORMAL HIGH (ref 0.3–1.2)
Total Protein: 6.8 g/dL (ref 6.5–8.1)

## 2019-01-27 LAB — TROPONIN I
Troponin I: 0.04 ng/mL (ref <0.03)
Troponin I: 0.05 ng/mL (ref <0.03)

## 2019-01-27 LAB — BRAIN NATRIURETIC PEPTIDE: B Natriuretic Peptide: 776.4 pg/mL — ABNORMAL HIGH (ref 0.0–100.0)

## 2019-01-27 MED ORDER — POTASSIUM CHLORIDE CRYS ER 20 MEQ PO TBCR
20.0000 meq | EXTENDED_RELEASE_TABLET | Freq: Once | ORAL | Status: AC
  Administered 2019-01-27: 20 meq via ORAL
  Filled 2019-01-27: qty 1

## 2019-01-27 MED ORDER — AMIODARONE HCL 200 MG PO TABS
200.0000 mg | ORAL_TABLET | Freq: Every day | ORAL | Status: DC
  Administered 2019-01-28: 200 mg via ORAL
  Filled 2019-01-27: qty 1

## 2019-01-27 MED ORDER — TAMSULOSIN HCL 0.4 MG PO CAPS
0.4000 mg | ORAL_CAPSULE | Freq: Every day | ORAL | Status: DC
  Administered 2019-01-27 – 2019-01-30 (×4): 0.4 mg via ORAL
  Filled 2019-01-27 (×4): qty 1

## 2019-01-27 MED ORDER — MECLIZINE HCL 25 MG PO TABS: 25.0000 mg | ORAL_TABLET | Freq: Three times a day (TID) | ORAL | Status: DC | PRN

## 2019-01-27 MED ORDER — FUROSEMIDE 10 MG/ML IJ SOLN
20.0000 mg | Freq: Once | INTRAMUSCULAR | Status: AC
  Administered 2019-01-27: 20 mg via INTRAVENOUS
  Filled 2019-01-27: qty 2

## 2019-01-27 MED ORDER — NITROGLYCERIN 0.4 MG SL SUBL: 0.4000 mg | SUBLINGUAL_TABLET | SUBLINGUAL | Status: DC | PRN

## 2019-01-27 MED ORDER — ISOSORBIDE MONONITRATE ER 60 MG PO TB24
60.0000 mg | ORAL_TABLET | Freq: Every day | ORAL | Status: DC
  Administered 2019-01-28 – 2019-01-31 (×4): 60 mg via ORAL
  Filled 2019-01-27 (×4): qty 1

## 2019-01-27 MED ORDER — SODIUM CHLORIDE 0.9% FLUSH
3.0000 mL | Freq: Two times a day (BID) | INTRAVENOUS | Status: DC
  Administered 2019-01-27 – 2019-01-31 (×8): 3 mL via INTRAVENOUS

## 2019-01-27 MED ORDER — ASPIRIN EC 81 MG PO TBEC
81.0000 mg | DELAYED_RELEASE_TABLET | Freq: Every day | ORAL | Status: DC
  Administered 2019-01-27 – 2019-01-31 (×5): 81 mg via ORAL
  Filled 2019-01-27 (×5): qty 1

## 2019-01-27 MED ORDER — HYDROCODONE-ACETAMINOPHEN 5-325 MG PO TABS: 1.0000 | ORAL_TABLET | Freq: Two times a day (BID) | ORAL | Status: DC | PRN

## 2019-01-27 MED ORDER — ENOXAPARIN SODIUM 40 MG/0.4ML ~~LOC~~ SOLN
40.0000 mg | SUBCUTANEOUS | Status: DC
  Administered 2019-01-28: 40 mg via SUBCUTANEOUS
  Filled 2019-01-27: qty 0.4

## 2019-01-27 MED ORDER — BISOPROLOL FUMARATE 5 MG PO TABS
2.5000 mg | ORAL_TABLET | Freq: Every day | ORAL | Status: DC
  Administered 2019-01-28 – 2019-01-31 (×3): 2.5 mg via ORAL
  Filled 2019-01-27 (×4): qty 1

## 2019-01-27 MED ORDER — LEVOTHYROXINE SODIUM 50 MCG PO TABS: 50.0000 ug | ORAL_TABLET | Freq: Every day | ORAL | Status: DC

## 2019-01-27 MED ORDER — ONDANSETRON HCL 4 MG/2ML IJ SOLN: 4.0000 mg | Freq: Four times a day (QID) | INTRAMUSCULAR | Status: DC | PRN

## 2019-01-27 MED ORDER — APIXABAN 5 MG PO TABS
5.0000 mg | ORAL_TABLET | Freq: Once | ORAL | Status: AC
  Administered 2019-01-27: 5 mg via ORAL
  Filled 2019-01-27: qty 1

## 2019-01-27 MED ORDER — SODIUM CHLORIDE 0.9% FLUSH: 3.0000 mL | INTRAVENOUS | Status: DC | PRN

## 2019-01-27 MED ORDER — SODIUM CHLORIDE 0.9 % IV SOLN: 250.0000 mL | INTRAVENOUS | Status: DC | PRN

## 2019-01-27 MED ORDER — ACETAMINOPHEN 325 MG PO TABS
650.0000 mg | ORAL_TABLET | ORAL | Status: DC | PRN
  Administered 2019-01-27: 650 mg via ORAL
  Filled 2019-01-27: qty 2

## 2019-01-27 MED ORDER — SENNA 8.6 MG PO TABS: 1.0000 | ORAL_TABLET | Freq: Every day | ORAL | Status: DC

## 2019-01-27 MED ORDER — FUROSEMIDE 20 MG PO TABS
20.0000 mg | ORAL_TABLET | Freq: Every day | ORAL | Status: DC
  Administered 2019-01-28: 20 mg via ORAL
  Filled 2019-01-27 (×2): qty 1

## 2019-01-27 MED ORDER — POTASSIUM CHLORIDE CRYS ER 20 MEQ PO TBCR
40.0000 meq | EXTENDED_RELEASE_TABLET | Freq: Every day | ORAL | Status: DC
  Administered 2019-01-28 – 2019-01-31 (×3): 40 meq via ORAL
  Filled 2019-01-27 (×4): qty 2

## 2019-01-27 MED ORDER — PANTOPRAZOLE SODIUM 40 MG PO TBEC
40.0000 mg | DELAYED_RELEASE_TABLET | Freq: Every day | ORAL | Status: DC
  Administered 2019-01-28 – 2019-01-31 (×4): 40 mg via ORAL
  Filled 2019-01-27 (×4): qty 1

## 2019-01-27 NOTE — Telephone Encounter (Signed)
Tried to call patient. Talked to patient's wife (DPR). She stated patient was not home and had run to [employer] down the road. She stated she would have patient call our office back. Informed patient's wife that the provider was wanting to know how severe patient's SOB was, that he might need to go to ED instead of office visit today. Will await patient to call back.

## 2019-01-27 NOTE — Progress Notes (Signed)
Cardiology Office Note:    Date:  01/27/2019   ID:  Ryan [name], DOB 11-05-1941, MRN [id number]  PCP:  Ryan Redwood, MD  Cardiologist:  Ryan Moores, MD   Electrophysiologist:  None  Vascular surgeon: Ryan [name]  Referring MD: Ryan Redwood, MD   Chief Complaint  Patient presents with  . Shortness of Breath  . Chest Pain     History of Present Illness:    Ryan [name] is a 78 y.o. male with coronary artery disease status post prior myocardial infarctions and subsequent CABG in 4650, combined systolic and diastolic heart failure, carotid artery disease, hypertension, hyperlipidemia, hypothyroidism, paroxysmal atrial fibrillation.  He underwent left CEA with Dr. Kellie [name] in May 2017.  He was diagnosed with atrial fibrillation in March 2018 during an admission and was placed on Apixaban and amiodarone.  He converted to normal sinus rhythm.  He was seen in the hospital again in February 2019 with chest pain and mildly abnormal troponin levels.  Stress Myoview was abnormal but he declined cardiac catheterization at that time.  He ultimately underwent cardiac catheterization in May 2019 due to worsening anginal symptoms.  This demonstrated three-vessel CAD.  LIMA-LAD was patent.  However, SVG-RI and SVG-RPDA were both occluded.  There were no good targets for PCI and medical therapy was recommended.  Echocardiogram in February 2019 demonstrated EF 40-45% with grade 2 diastolic dysfunction.  He was last seen by Ryan [name] in August 2019.   Ryan [name] returns for the evaluation of chest pain and shortness of breath.  He was dx with pneumonia several weeks ago and treated with antibiotics.  He had a follow up with his PCP earlier this week and the notes indicate his CXR is now clear.  However, the patient has noted shortness of breath with minimal exertion that seems to have gotten worse over the past 2-3 weeks.  He also notes substernal chest pain with exertion at times.  He has had chest  pain at rest at well.  He had recurrent chest pain at rest with assoc shortness of breath this AM that resolved with SL NTG.  He has a long hx of nausea but this is not assoc with his chest pain.  He has R shoulder pain as well that he thinks may be related to statin Rx.  His PCP had him hold his Rosuvastatin.  He also notes dizziness.  He describes this as a spinning sensation. He has fallen frequently.  He denies syncope, paroxysmal nocturnal dyspnea, leg swelling.    Prior CV studies:   The following studies were reviewed today:  Carotid US 10/17/2018 Summary: Right Carotid: Velocities in the right ICA are consistent with a 40-59%                stenosis. The ECA appears >50% stenosed. Left Carotid: Velocities in the left ICA are consistent with a 1-39% stenosis. Vertebrals:  Bilateral vertebral arteries demonstrate antegrade flow. Subclavians: Normal flow hemodynamics were seen in bilateral subclavian              arteries.   Cardiac catheterization 04/22/2018 LAD proximal 100; D1 ostial 99, 90 RI ostial 75 LCx ostial 70, mid 100-CTO; L-L collats RCA proximal 100-CTO; L-R collats LIMA-LAD patent SVG-RI 100 SVG-RPDA 100 No good targets for PCI >> Med Rx    Echocardiogram 02/08/2018 Severe concentric LVH, EF 40-45, inferior and inferoseptal HK, grade 2 diastolic dysfunction, trivial MR, mild LAE, mildly reduced RV SF, mild TR, PASP 28  Myoview 02/08/2018 There is a medium size, mild severity, reversible defect in the mid and apical inferior and mid inferolateral leads consistent with mild ischemia (SDS =3).  Echocardiogram 02/27/2017 Mild LVH, EF 45, mid anteroseptal/mid inferoseptal/basal inferior akinesis; mid inferior severe hypokinesis, mild to moderate aortic stenosis, moderate to severe LAE, severely reduced RV SF  Carotid US 11/27/16 RCA <40; L CEA patent  LHC 4/16 LAD proximal 40-50, mid and mid-distal 95 and 80, D1 ostial 90, D2 mid 50 with 90 superior branch LCx AV  groove 70-80 and 100 after OM1 takeoff RCA 100 proximal  EF 60  Myoview 3/16 Inf and inf-lat scar, EF 49, Intermediate Risk  Past Medical History:  Diagnosis Date  . Arthritis   . CAD (coronary artery disease)   . Carotid artery occlusion   . Cataract    Bil eyes/worse in left eye  . CHF (congestive heart failure) ([place])   . Chronic back pain   . DVT (deep venous thrombosis) ([place])   . Dysrhythmia   . Enlarged prostate    takes Rapaflo daily  . GERD (gastroesophageal reflux disease)    occasional  . History of colon polyps   . History of gout    has colchicine prn  . History of kidney stones   . Hyperlipidemia    takes Crestor daily  . Hypertension    takes Amlodipine daily  . Hypothyroidism   . Myocardial infarction ([place])   . Peripheral vascular disease ([place])   . Pulmonary emboli ([place]) 03/20/2015   elevated d-dimer, intermediate V/Q study, atypical chest pain and SOB. Start on Xarelto 20mg  BID for 3 month  . Rapid atrial fibrillation ([place])   . Renal insufficiency   . Shortness of breath dyspnea   . Urinary frequency   . Urinary urgency    Surgical Hx: The patient  has a past surgical history that includes Femoral artery - popliteal artery bypass graft; Joint replacement; Back surgery; Lumbar laminectomy/decompression microdiscectomy (02/12/2012); Appendectomy; cataract surgery; Colonoscopy; big toe surgery; Cystoscopy; Lumbar laminectomy (01/06/2013); Cardiac catheterization; Eye surgery; Total hip arthroplasty (Left, 01/09/2014); Total hip arthroplasty (Left, 01/09/2014); Steriod injection (Right, 01/09/2014); left heart catheterization with coronary angiogram (N/A, 04/03/2015); Coronary artery bypass graft (N/A, 04/05/2015); TEE without cardioversion (N/A, 04/05/2015); Endarterectomy (Left, 04/24/2016); Patch angioplasty (Left, 04/24/2016); Cholecystectomy (N/A, 07/27/2016); and CORONARY/GRAFT ANGIOGRAPHY (N/A, 04/22/2018).   Current Medications: Current Meds  Medication Sig  .  amiodarone (PACERONE) 200 MG tablet TAKE ONE TABLET BY MOUTH DAILY  . apixaban (ELIQUIS) 5 MG TABS tablet Take 1 tablet (5 mg total) by mouth 2 (two) times daily.  . bisoprolol (ZEBETA) 5 MG tablet TAKE ONE-HALF TABLET BY MOUTH DAILY  . furosemide (LASIX) 20 MG tablet Take 1 tablet (20 mg total) by mouth daily.  Marland Kitchen HYDROcodone-acetaminophen (NORCO/VICODIN) 5-325 MG tablet Take 1 tablet by mouth every 12 (twelve) hours as needed for moderate pain or severe pain (pain).  . isosorbide mononitrate (IMDUR) 30 MG 24 hr tablet Take 1 tablet (30 mg total) by mouth daily.  Marland Kitchen levothyroxine (UNITHROID) 50 MCG tablet Take 50 mcg by mouth daily before breakfast. "Unithroid"  . meclizine (ANTIVERT) 25 MG tablet Take 25 mg by mouth 3 (three) times daily as needed for dizziness.   . nitroGLYCERIN (NITROSTAT) 0.4 MG SL tablet Place 1 tablet (0.4 mg total) under the tongue every 5 (five) minutes as needed for chest pain (CP or SOB).  . pantoprazole (PROTONIX) 40 MG tablet Take 40 mg by mouth daily.  . potassium  chloride SA (K-DUR,KLOR-CON) 20 MEQ tablet TAKE TWO TABLETS BY MOUTH DAILY  . rosuvastatin (CRESTOR) 10 MG tablet Take 10 mg by mouth 3 (three) times a week.   . senna (SENOKOT) 8.6 MG TABS tablet Take 1 tablet (8.6 mg total) by mouth daily.  . tamsulosin (FLOMAX) 0.4 MG CAPS capsule Take 0.4 mg by mouth at bedtime.  . [DISCONTINUED] pantoprazole (PROTONIX) 40 MG tablet Take 40 mg by mouth daily.     Allergies:   Codeine and Zetia [ezetimibe]   Social History   Tobacco Use  . Smoking status: Former Smoker    Types: Cigarettes    Last attempt to quit: 02/04/1987    Years since quitting: 32.0  . Smokeless tobacco: Former Systems developer    Types: Chew    Quit date: 07/20/2009  . Tobacco comment: quit 35+yrs ago  Substance Use Topics  . Alcohol use: No    Alcohol/week: 0.0 standard drinks  . Drug use: No     Family Hx: The patient's family history includes Diabetes in his son; Heart attack in his father and  sister; Heart disease in his father and sister; Hypertension in his mother and sister.  ROS:   Please see the history of present illness.    Review of Systems  Constitution: Positive for decreased appetite and malaise/fatigue.  Cardiovascular: Positive for chest pain, dyspnea on exertion and irregular heartbeat.  Hematologic/Lymphatic: Bruises/bleeds easily.  Musculoskeletal: Positive for back pain.  Gastrointestinal: Positive for nausea.  Neurological: Positive for dizziness and loss of balance.  Psychiatric/Behavioral: The patient is nervous/anxious.    All other systems reviewed and are negative.   EKGs/Labs/Other Test Reviewed:    EKG:  EKG is  ordered today.  The ekg ordered today demonstrates normal sinus rhythm, HR 71, PRWP, non-specific ST-TW changes, QTc 484, PACs, no significant changes.   Recent Labs: 02/08/2018: TSH 13.040 04/20/2018: ALT 14 04/24/2018: BUN 13; Creatinine, Ser 1.43; Hemoglobin 13.2; Platelets 225; Potassium 4.2; Sodium 137   Recent Lipid Panel Lab Results  Component Value Date/Time   CHOL 236 (H) 04/20/2018 09:32 AM   TRIG 137 04/20/2018 09:32 AM   HDL 43 04/20/2018 09:32 AM   CHOLHDL 5.5 (H) 04/20/2018 09:32 AM   CHOLHDL 3.8 02/08/2018 10:03 AM   LDLCALC 166 (H) 04/20/2018 09:32 AM    Physical Exam:    VS: BP 160/76 Pulse 71  Ht 5\' 6"  (1.676 m)   Wt 170 lb 12.8 oz (77.5 kg)   SpO2 96%   BMI 27.57 kg/m     Wt Readings from Last 3 Encounters:  01/27/19 170 lb 12.8 oz (77.5 kg)  10/17/18 173 lb (78.5 kg)  07/21/18 172 lb 1.9 oz (78.1 kg)     Physical Exam  Constitutional: He is oriented to person, place, and time. He appears well-developed and well-nourished. No distress.  HENT:  Head: Normocephalic and atraumatic.  Eyes: No scleral icterus.  Neck: No JVD present. No thyromegaly present.  Cardiovascular: Normal rate and regular rhythm.  No murmur heard. Pulmonary/Chest: Effort normal. He has no wheezes. He has no rales.  Abdominal: Soft.   Musculoskeletal:        General: No edema.  Lymphadenopathy:    He has no cervical adenopathy.  Neurological: He is alert and oriented to person, place, and time.  Skin: Skin is warm and dry.  Psychiatric: He has a normal mood and affect.    ASSESSMENT & PLAN:    Coronary artery disease involving native coronary artery of  native heart with unstable angina pectoris ([place]) Hx of prior myocardial infarctions and subsequent CABG in 2016.  Cardiac Catheterization in 5/19 demonstrated that both vein grafts were occluded.  The L-LAD was patent.  There were no targets for PCI.  He now presents with progressively worsening shortness of breath as well as chest pain responsive to SL NTG.  His symptoms did not really improve any with treatment of community-acquired pneumonia.  His ECG does not show any ischemic changes.  However, his symptoms are suspicious for unstable angina.  He does not appear to be volume overloaded.  He did have symptoms at rest this morning.  I have recommended admission to the hospital with plans to proceed with Cardiac Catheterization on Monday.  I reviewed this with Dr. Radford Pax (attending MD) who also saw the patient.  She agrees with admission.  -Admit to [facility] serial Troponin levels  -CMET, CBC, BNP  -Plan R/L Cardiac Catheterization Monday 01/30/19  -Continue bisoprolol at 2.5 mg QD  -Increase Isosorbide to 60 mg QD  -Statin Rx currently on hold due to myalgias  -Start ASA 81 mg QD  Chronic combined systolic and diastolic CHF (congestive heart failure) ([place])  EF 40-45 by Echo in 2/19.  NYHA 3.  Increased dyspnea is likely an anginal equivalent.  He does not appear volume overloaded.  Repeat CXR today and obtain BNP.  Plan on R/L heart cath on Monday.  If BNP significantly elevated, we can increase his Lasix.  Obtain repeat Echo to reassess LVF as well.   -BNP  -Echo  -CXR  PAF (paroxysmal atrial fibrillation) (HCC)  Maintaining NSR.  Plan on giving his last  dose of Eliquis tonight. Hold starting tomorrow for Cath on Monday.  Bilateral carotid artery disease, unspecified type ([place])  Continue follow up with [facility].  Essential hypertension BP above target.  Adjust Isosorbide as noted.  History of pneumonia  Apparently resolved on CXR earlier this week.  Dizziness  He describes symptoms of vertigo.  He should follow up with his PCP to further evaluate this.  Chronic Kidney Disease Obtain follow up CMET.  Try to limit IV dye at cath.  Obtain Echo to assess LVF.   Dispo:  Return for [facility] follow up after discharge.   Medication Adjustments/Labs and Tests Ordered: Current medicines are reviewed at length with the patient today.  Concerns regarding medicines are outlined above.  Tests Ordered: Orders Placed This Encounter  Procedures  . EKG 12-Lead   Medication Changes: No orders of the defined types were placed in this encounter.   Signed, [name], PA-C  01/27/2019 2:17 PM    [facility] [place], [place], [place]  [address] Phone: [phone]; Fax: [phone]

## 2019-01-27 NOTE — Plan of Care (Signed)
  Problem: Education: Goal: Knowledge of General Education information will improve Description Including pain rating scale, medication(s)/side effects and non-pharmacologic comfort measures 01/27/2019 2335 by Winona Legato, Amedeo Gory, RN Outcome: Progressing 01/27/2019 2155 by Winona Legato, Amedeo Gory, RN Outcome: Progressing   Problem: Health Behavior/Discharge Planning: Goal: Ability to manage health-related needs will improve 01/27/2019 2335 by Winona Legato, Amedeo Gory, RN Outcome: Progressing 01/27/2019 2155 by Winona Legato, Amedeo Gory, RN Outcome: Progressing   Problem: Clinical Measurements: Goal: Ability to maintain clinical measurements within normal limits will improve 01/27/2019 2335 by Winona Legato, Amedeo Gory, RN Outcome: Progressing 01/27/2019 2155 by Winona Legato, Amedeo Gory, RN Outcome: Progressing Goal: Will remain free from infection 01/27/2019 2335 by Winona Legato, Amedeo Gory, RN Outcome: Progressing 01/27/2019 2155 by Winona Legato, Amedeo Gory, RN Outcome: Progressing Goal: Diagnostic test results will improve 01/27/2019 2335 by Winona Legato, Amedeo Gory, RN Outcome: Progressing 01/27/2019 2155 by Winona Legato, Amedeo Gory, RN Outcome: Progressing Goal: Respiratory complications will improve 01/27/2019 2335 by Winona Legato, Amedeo Gory, RN Outcome: Progressing 01/27/2019 2155 by Winona Legato, Amedeo Gory, RN Outcome: Progressing Goal: Cardiovascular complication will be avoided 01/27/2019 2335 by Henrine Screws, RN Outcome: Progressing 01/27/2019 2155 by Winona Legato, Amedeo Gory, RN Outcome: Progressing   Problem: Activity: Goal: Risk for activity intolerance will decrease 01/27/2019 2335 by Winona Legato, Amedeo Gory, RN Outcome: Progressing 01/27/2019 2155 by Winona Legato, Amedeo Gory, RN Outcome: Progressing   Problem: Nutrition: Goal: Adequate nutrition will be maintained 01/27/2019 2335 by Winona Legato, Amedeo Gory, RN Outcome: Progressing 01/27/2019 2155 by Winona Legato, Amedeo Gory, RN Outcome: Progressing

## 2019-01-27 NOTE — Progress Notes (Signed)
BNP elevated. Will give 1 dose of Lasix 20 mg IV along with 1 extra dose of K+ 20 mEq. BMET in AM [place], [place]    01/27/2019 5:08 PM

## 2019-01-27 NOTE — Progress Notes (Signed)
CRITICAL VALUE ALERT  Critical Value:  Troponin 0.04  Date & Time Notied:  01/27/2019 at [place]  Provider Notified: Eulas Post, [place]  Orders Received/Actions taken: no orders received at this time. Pt currently chest pain free. Asymptomatic. PA to assess pt at bedside shortly.

## 2019-01-27 NOTE — Telephone Encounter (Signed)
Patient called back and stated he wants to come to the office not the ED.

## 2019-01-27 NOTE — Telephone Encounter (Signed)
Disregard

## 2019-01-27 NOTE — Progress Notes (Signed)
Pt arrived to 4e as a direct admit. Pt oriented to room and staff. Family at bedside. Vitals obtained. Telemetry box applied and CCMD notified x2. IV team consulted as pt stated that he is a difficult stick and prefers them to place IV. Admission questions completed. Pt denies needs at this time.    Ara Kussmaul BSN, RN

## 2019-01-27 NOTE — H&P (Addendum)
Admission H&P:    Date:  01/27/2019   ID:  Ryan [name], DOB 10-Jun-1941, MRN [id number]  PCP:  Marton Redwood, MD  Cardiologist:  Mertie Moores, MD   Electrophysiologist:  None  Vascular surgeon: Dr. Trula Slade  Referring MD: Marton Redwood, MD   Chief Complaint  Patient presents with  . Shortness of Breath  . Chest Pain     History of Present Illness:    Ryan [name] is a 78 y.o. male with coronary artery disease status post prior myocardial infarctions and subsequent CABG in 4128, combined systolic and diastolic heart failure, carotid artery disease, hypertension, hyperlipidemia, hypothyroidism, paroxysmal atrial fibrillation.  He underwent left CEA with Dr. Kellie Simmering in May 2017.  He was diagnosed with atrial fibrillation in March 2018 during an admission and was placed on Apixaban and amiodarone.  He converted to normal sinus rhythm.  He was seen in the hospital again in February 2019 with chest pain and mildly abnormal troponin levels.  Stress Myoview was abnormal but he declined cardiac catheterization at that time.  He ultimately underwent cardiac catheterization in May 2019 due to worsening anginal symptoms.  This demonstrated three-vessel CAD.  LIMA-LAD was patent.  However, SVG-RI and SVG-RPDA were both occluded.  There were no good targets for PCI and medical therapy was recommended.  Echocardiogram in February 2019 demonstrated EF 40-45% with grade 2 diastolic dysfunction.  He was last seen by Dr. Acie Fredrickson in August 2019.   Ryan [name] returns for the evaluation of chest pain and shortness of breath.  He was dx with pneumonia several weeks ago and treated with antibiotics.  He had a follow up with his PCP earlier this week and the notes indicate his CXR is now clear.  However, the patient has noted shortness of breath with minimal exertion that seems to have gotten worse over the past 2-3 weeks.  He also notes substernal chest pain with exertion at times.  He has had chest pain at  rest at well.  He had recurrent chest pain at rest with assoc shortness of breath this AM that resolved with SL NTG.  He has a long hx of nausea but this is not assoc with his chest pain.  He has R shoulder pain as well that he thinks may be related to statin Rx.  His PCP had him hold his Rosuvastatin.  He also notes dizziness.  He describes this as a spinning sensation. He has fallen frequently.  He denies syncope, paroxysmal nocturnal dyspnea, leg swelling.    Prior CV studies:   The following studies were reviewed today:  Carotid US 10/17/2018 Summary: Right Carotid: Velocities in the right ICA are consistent with a 40-59%                stenosis. The ECA appears >50% stenosed. Left Carotid: Velocities in the left ICA are consistent with a 1-39% stenosis. Vertebrals:  Bilateral vertebral arteries demonstrate antegrade flow. Subclavians: Normal flow hemodynamics were seen in bilateral subclavian              arteries.   Cardiac catheterization 04/22/2018 LAD proximal 100; D1 ostial 99, 90 RI ostial 75 LCx ostial 70, mid 100-CTO; L-L collats RCA proximal 100-CTO; L-R collats LIMA-LAD patent SVG-RI 100 SVG-RPDA 100 No good targets for PCI >> Med Rx    Echocardiogram 02/08/2018 Severe concentric LVH, EF 40-45, inferior and inferoseptal HK, grade 2 diastolic dysfunction, trivial MR, mild LAE, mildly reduced RV SF, mild TR, PASP 28  Myoview 02/08/2018 There is a medium size, mild severity, reversible defect in the mid and apical inferior and mid inferolateral leads consistent with mild ischemia (SDS =3).  Echocardiogram 02/27/2017 Mild LVH, EF 45, mid anteroseptal/mid inferoseptal/basal inferior akinesis; mid inferior severe hypokinesis, mild to moderate aortic stenosis, moderate to severe LAE, severely reduced RV SF  Carotid US 11/27/16 RCA <40; L CEA patent  LHC 4/16 LAD proximal 40-50, mid and mid-distal 95 and 80, D1 ostial 90, D2 mid 50 with 90 superior branch LCx AV groove 70-80  and 100 after OM1 takeoff RCA 100 proximal  EF 60  Myoview 3/16 Inf and inf-lat scar, EF 49, Intermediate Risk  Past Medical History:  Diagnosis Date  . Arthritis   . CAD (coronary artery disease)   . Carotid artery occlusion   . Cataract    Bil eyes/worse in left eye  . CHF (congestive heart failure) ([place])   . Chronic back pain   . DVT (deep venous thrombosis) ([place])   . Dysrhythmia   . Enlarged prostate    takes Rapaflo daily  . GERD (gastroesophageal reflux disease)    occasional  . History of colon polyps   . History of gout    has colchicine prn  . History of kidney stones   . Hyperlipidemia    takes Crestor daily  . Hypertension    takes Amlodipine daily  . Hypothyroidism   . Myocardial infarction ([place])   . Peripheral vascular disease ([place])   . Pulmonary emboli ([place]) 03/20/2015   elevated d-dimer, intermediate V/Q study, atypical chest pain and SOB. Start on Xarelto 20mg  BID for 3 month  . Rapid atrial fibrillation ([place])   . Renal insufficiency   . Shortness of breath dyspnea   . Urinary frequency   . Urinary urgency    Surgical Hx: The patient  has a past surgical history that includes Femoral artery - popliteal artery bypass graft; Joint replacement; Back surgery; Lumbar laminectomy/decompression microdiscectomy (02/12/2012); Appendectomy; cataract surgery; Colonoscopy; big toe surgery; Cystoscopy; Lumbar laminectomy (01/06/2013); Cardiac catheterization; Eye surgery; Total hip arthroplasty (Left, 01/09/2014); Total hip arthroplasty (Left, 01/09/2014); Steriod injection (Right, 01/09/2014); left heart catheterization with coronary angiogram (N/A, 04/03/2015); Coronary artery bypass graft (N/A, 04/05/2015); TEE without cardioversion (N/A, 04/05/2015); Endarterectomy (Left, 04/24/2016); Patch angioplasty (Left, 04/24/2016); Cholecystectomy (N/A, 07/27/2016); and CORONARY/GRAFT ANGIOGRAPHY (N/A, 04/22/2018).   Current Medications: Current Meds  Medication Sig  . amiodarone  (PACERONE) 200 MG tablet TAKE ONE TABLET BY MOUTH DAILY  . apixaban (ELIQUIS) 5 MG TABS tablet Take 1 tablet (5 mg total) by mouth 2 (two) times daily.  . bisoprolol (ZEBETA) 5 MG tablet TAKE ONE-HALF TABLET BY MOUTH DAILY  . furosemide (LASIX) 20 MG tablet Take 1 tablet (20 mg total) by mouth daily.  Marland Kitchen HYDROcodone-acetaminophen (NORCO/VICODIN) 5-325 MG tablet Take 1 tablet by mouth every 12 (twelve) hours as needed for moderate pain or severe pain (pain).  . isosorbide mononitrate (IMDUR) 30 MG 24 hr tablet Take 1 tablet (30 mg total) by mouth daily.  Marland Kitchen levothyroxine (UNITHROID) 50 MCG tablet Take 50 mcg by mouth daily before breakfast. "Unithroid"  . meclizine (ANTIVERT) 25 MG tablet Take 25 mg by mouth 3 (three) times daily as needed for dizziness.   . nitroGLYCERIN (NITROSTAT) 0.4 MG SL tablet Place 1 tablet (0.4 mg total) under the tongue every 5 (five) minutes as needed for chest pain (CP or SOB).  . pantoprazole (PROTONIX) 40 MG tablet Take 40 mg by mouth daily.  . potassium  chloride SA (K-DUR,KLOR-CON) 20 MEQ tablet TAKE TWO TABLETS BY MOUTH DAILY  . rosuvastatin (CRESTOR) 10 MG tablet Take 10 mg by mouth 3 (three) times a week.   . senna (SENOKOT) 8.6 MG TABS tablet Take 1 tablet (8.6 mg total) by mouth daily.  . tamsulosin (FLOMAX) 0.4 MG CAPS capsule Take 0.4 mg by mouth at bedtime.  . [DISCONTINUED] pantoprazole (PROTONIX) 40 MG tablet Take 40 mg by mouth daily.     Allergies:   Codeine and Zetia [ezetimibe]   Social History   Tobacco Use  . Smoking status: Former Smoker    Types: Cigarettes    Last attempt to quit: 02/04/1987    Years since quitting: 32.0  . Smokeless tobacco: Former Systems developer    Types: Chew    Quit date: 07/20/2009  . Tobacco comment: quit 35+yrs ago  Substance Use Topics  . Alcohol use: No    Alcohol/week: 0.0 standard drinks  . Drug use: No     Family Hx: The patient's family history includes Diabetes in his son; Heart attack in his father and sister;  Heart disease in his father and sister; Hypertension in his mother and sister.  ROS:   Please see the history of present illness.    Review of Systems  Constitution: Positive for decreased appetite and malaise/fatigue.  Cardiovascular: Positive for chest pain, dyspnea on exertion and irregular heartbeat.  Hematologic/Lymphatic: Bruises/bleeds easily.  Musculoskeletal: Positive for back pain.  Gastrointestinal: Positive for nausea.  Neurological: Positive for dizziness and loss of balance.  Psychiatric/Behavioral: The patient is nervous/anxious.    All other systems reviewed and are negative.   EKGs/Labs/Other Test Reviewed:    EKG:  EKG is  ordered today.  The ekg ordered today demonstrates normal sinus rhythm, HR 71, PRWP, non-specific ST-TW changes, QTc 484, PACs, no significant changes.   Recent Labs: 02/08/2018: TSH 13.040 04/20/2018: ALT 14 04/24/2018: BUN 13; Creatinine, Ser 1.43; Hemoglobin 13.2; Platelets 225; Potassium 4.2; Sodium 137   Recent Lipid Panel Lab Results  Component Value Date/Time   CHOL 236 (H) 04/20/2018 09:32 AM   TRIG 137 04/20/2018 09:32 AM   HDL 43 04/20/2018 09:32 AM   CHOLHDL 5.5 (H) 04/20/2018 09:32 AM   CHOLHDL 3.8 02/08/2018 10:03 AM   LDLCALC 166 (H) 04/20/2018 09:32 AM    Physical Exam:    VS:  BP 160/76 Pulse 71 Ht 5\' 6"  (1.676 m)   Wt 170 lb 12.8 oz (77.5 kg)   SpO2 96%   BMI 27.57 kg/m     Wt Readings from Last 3 Encounters:  01/27/19 170 lb 12.8 oz (77.5 kg)  10/17/18 173 lb (78.5 kg)  07/21/18 172 lb 1.9 oz (78.1 kg)     Physical Exam  Constitutional: He is oriented to person, place, and time. He appears well-developed and well-nourished. No distress.  HENT:  Head: Normocephalic and atraumatic.  Eyes: No scleral icterus.  Neck: No JVD present. No thyromegaly present.  Cardiovascular: Normal rate and regular rhythm.  No murmur heard. Pulmonary/Chest: Effort normal. He has no wheezes. He has no rales.  Abdominal: Soft.    Musculoskeletal:        General: No edema.  Lymphadenopathy:    He has no cervical adenopathy.  Neurological: He is alert and oriented to person, place, and time.  Skin: Skin is warm and dry.  Psychiatric: He has a normal mood and affect.    ASSESSMENT & PLAN:    Coronary artery disease involving native coronary artery  of native heart with unstable angina pectoris ([place]) Hx of prior myocardial infarctions and subsequent CABG in 2016.  Cardiac Catheterization in 5/19 demonstrated that both vein grafts were occluded.  The L-LAD was patent.  There were no targets for PCI.  He now presents with progressively worsening shortness of breath as well as chest pain responsive to SL NTG.  His symptoms did not really improve any with treatment of community-acquired pneumonia.  His ECG does not show any ischemic changes.  However, his symptoms are suspicious for unstable angina.  He does not appear to be volume overloaded.  He did have symptoms at rest this morning.  I have recommended admission to the hospital with plans to proceed with Cardiac Catheterization on Monday.  I reviewed this with Dr. Radford Pax (attending MD) who also saw the patient.  She agrees with admission.  -Admit to [place] serial Troponin levels  -CMET, CBC, BNP  -Plan R/L Cardiac Catheterization Monday 01/30/19  -Continue bisoprolol at 2.5 mg QD  -Increase Isosorbide to 60 mg QD  -Statin Rx currently on hold due to myalgias  -Start ASA 81 mg QD  Chronic combined systolic and diastolic CHF (congestive heart failure) ([place])  EF 40-45 by Echo in 2/19.  NYHA 3.  Increased dyspnea is likely an anginal equivalent.  He does not appear volume overloaded.  Repeat CXR today and obtain BNP.  Plan on R/L heart cath on Monday.  If BNP significantly elevated, we can increase his Lasix.  Obtain repeat Echo to reassess LVF as well.   -BNP  -Echo  -CXR  PAF (paroxysmal atrial fibrillation) (HCC)  Maintaining NSR.  Plan on giving his last  dose of Eliquis tonight. Hold starting tomorrow for Cath on Monday.  Bilateral carotid artery disease, unspecified type ([place])  Continue follow up with [facility].  Essential hypertension BP above target.  Adjust Isosorbide as noted.  History of pneumonia  Apparently resolved on CXR earlier this week.  Dizziness  He describes symptoms of vertigo.  He should follow up with his PCP to further evaluate this.  Chronic Kidney Disease Obtain follow up CMET.  Try to limit IV dye at cath.  Obtain Echo to assess LVF.   Dispo:  Return for [facility] follow up after discharge.   Medication Adjustments/Labs and Tests Ordered: Current medicines are reviewed at length with the patient today.  Concerns regarding medicines are outlined above.  Tests Ordered: Orders Placed This Encounter  Procedures  . EKG 12-Lead   Medication Changes: No orders of the defined types were placed in this encounter.   Severity of Illness: The appropriate patient status for this patient is INPATIENT. Inpatient status is judged to be reasonable and necessary in order to provide the required intensity of service to ensure the patient's safety. The patient's presenting symptoms, physical exam findings, and initial radiographic and laboratory data in the context of their chronic comorbidities is felt to place them at high risk for further clinical deterioration. Furthermore, it is not anticipated that the patient will be medically stable for discharge from the hospital within 2 midnights of admission. The following factors support the patient status of inpatient.   " The patient's presenting symptoms include chest pain, shortness of breath. " The worrisome physical exam findings include n/a. " The initial radiographic and laboratory data are worrisome because of n/a. " The chronic co-morbidities include CAD, CHF, parox AFib, HTN, CKD.   * I certify that at the point of admission it is  my clinical judgment that the patient  will require inpatient hospital care spanning beyond 2 midnights from the point of admission due to high intensity of service, high risk for further deterioration and high frequency of surveillance required.*    For questions or updates, please contact [place] Please consult [email or web address] for contact info under   Signed, [name], PA-C  01/27/2019 1:58 PM

## 2019-01-27 NOTE — Patient Instructions (Addendum)
YOU HAVE BEEN ADMITTED TO THE HOSPITAL.

## 2019-01-27 NOTE — Plan of Care (Signed)

## 2019-01-28 ENCOUNTER — Observation Stay (HOSPITAL_BASED_OUTPATIENT_CLINIC_OR_DEPARTMENT_OTHER): Payer: Medicare Other

## 2019-01-28 DIAGNOSIS — I361 Nonrheumatic tricuspid (valve) insufficiency: Secondary | ICD-10-CM | POA: Diagnosis not present

## 2019-01-28 DIAGNOSIS — R0602 Shortness of breath: Secondary | ICD-10-CM

## 2019-01-28 DIAGNOSIS — I5033 Acute on chronic diastolic (congestive) heart failure: Secondary | ICD-10-CM

## 2019-01-28 DIAGNOSIS — I25110 Atherosclerotic heart disease of native coronary artery with unstable angina pectoris: Principal | ICD-10-CM

## 2019-01-28 LAB — ECHOCARDIOGRAM COMPLETE
Height: 66 in
WEIGHTICAEL: 2620.83 [oz_av]

## 2019-01-28 LAB — TROPONIN I: Troponin I: 0.06 ng/mL (ref <0.03)

## 2019-01-28 LAB — BASIC METABOLIC PANEL
Anion gap: 11 (ref 5–15)
BUN: 14 mg/dL (ref 8–23)
CO2: 25 mmol/L (ref 22–32)
CREATININE: 1.18 mg/dL (ref 0.61–1.24)
Calcium: 8.8 mg/dL — ABNORMAL LOW (ref 8.9–10.3)
Chloride: 104 mmol/L (ref 98–111)
GFR calc Af Amer: >60 mL/min (ref >60)
GFR, EST NON AFRICAN AMERICAN: 59 mL/min — AB (ref >60)
GLUCOSE: 91 mg/dL (ref 70–99)
Potassium: 4 mmol/L (ref 3.5–5.1)
Sodium: 140 mmol/L (ref 135–145)

## 2019-01-28 LAB — TSH: TSH: 39.642 u[IU]/mL — ABNORMAL HIGH (ref 0.350–4.500)

## 2019-01-28 MED ORDER — HEPARIN (PORCINE) 25000 UT/250ML-% IV SOLN
1150.0000 [IU]/h | INTRAVENOUS | Status: DC
  Administered 2019-01-28: 900 [IU]/h via INTRAVENOUS
  Administered 2019-01-29: 1150 [IU]/h via INTRAVENOUS
  Filled 2019-01-28 (×2): qty 250

## 2019-01-28 MED ORDER — AMIODARONE HCL 200 MG PO TABS
100.0000 mg | ORAL_TABLET | Freq: Every day | ORAL | Status: DC
  Administered 2019-01-29 – 2019-01-31 (×3): 100 mg via ORAL
  Filled 2019-01-28 (×3): qty 1

## 2019-01-28 MED ORDER — ROSUVASTATIN CALCIUM 5 MG PO TABS
10.0000 mg | ORAL_TABLET | Freq: Every day | ORAL | Status: DC
  Administered 2019-01-28 – 2019-01-29 (×2): 10 mg via ORAL
  Filled 2019-01-28 (×2): qty 2

## 2019-01-28 MED ORDER — FUROSEMIDE 10 MG/ML IJ SOLN
40.0000 mg | Freq: Two times a day (BID) | INTRAMUSCULAR | Status: DC
  Administered 2019-01-28 – 2019-01-31 (×5): 40 mg via INTRAVENOUS
  Filled 2019-01-28 (×5): qty 4

## 2019-01-28 NOTE — Progress Notes (Signed)
ANTICOAGULATION CONSULT NOTE - Initial Consult  Pharmacy Consult for heparin  Indication: atrial fibrillation  Allergies  Allergen Reactions  . Codeine Nausea And Vomiting       . Zetia [Ezetimibe] Other (See Comments)    Myalgias   . Unithroid [Levothyroxine Sodium] Other (See Comments)    Caused blurry vision per pt    Patient Measurements: Height: 5\' 6"  (167.6 cm) Weight: 163 lb 12.8 oz (74.3 kg) IBW/kg (Calculated) : 63.8   Vital Signs: Temp: 97.7 F (36.5 C) (02/08 1424) Temp Source: Oral (02/08 1424) BP: 132/52 (02/08 1424) Pulse Rate: 56 (02/08 1424)  Labs: Recent Labs    01/27/19 1519 01/27/19 2113 01/28/19 0251  HGB 13.0  --   --   HCT 40.9  --   --   PLT 237  --   --   CREATININE 1.30*  --  1.18  TROPONINI 0.04* 0.05* 0.06*    Estimated Creatinine Clearance: 47.3 mL/min (by C-G formula based on SCr of 1.18 mg/dL).   Medical History: Past Medical History:  Diagnosis Date  . Arthritis   . CAD (coronary artery disease)   . Carotid artery occlusion   . Cataract    Bil eyes/worse in left eye  . CHF (congestive heart failure) ([place])   . Chronic back pain   . DVT (deep venous thrombosis) ([place])   . Dysrhythmia   . Enlarged prostate    takes Rapaflo daily  . GERD (gastroesophageal reflux disease)    occasional  . History of colon polyps   . History of gout    has colchicine prn  . History of kidney stones   . Hyperlipidemia    takes Crestor daily  . Hypertension    takes Amlodipine daily  . Hypothyroidism   . Myocardial infarction ([place])   . Peripheral vascular disease ([place])   . Pulmonary emboli ([place]) 03/20/2015   elevated d-dimer, intermediate V/Q study, atypical chest pain and SOB. Start on Xarelto 20mg  BID for 3 month  . Rapid atrial fibrillation ([place])   . Renal insufficiency   . Shortness of breath dyspnea   . Urinary frequency   . Urinary urgency     Medications:  Medications Prior to Admission  Medication Sig Dispense Refill Last  Dose  . amiodarone (PACERONE) 200 MG tablet TAKE ONE TABLET BY MOUTH DAILY (Patient taking differently: Take 200 mg by mouth daily. ) 90 tablet 2 01/27/2019 at am  . apixaban (ELIQUIS) 5 MG TABS tablet Take 1 tablet (5 mg total) by mouth 2 (two) times daily. 60 tablet 11 01/27/2019 at 800  . bisoprolol (ZEBETA) 5 MG tablet TAKE ONE-HALF TABLET BY MOUTH DAILY (Patient taking differently: Take 2.5 mg by mouth daily. ) 45 tablet 3 01/27/2019 at 800  . furosemide (LASIX) 20 MG tablet Take 1 tablet (20 mg total) by mouth daily. 90 tablet 3 01/26/2019 at am  . isosorbide mononitrate (IMDUR) 30 MG 24 hr tablet Take 1 tablet (30 mg total) by mouth daily. 90 tablet 3 01/27/2019 at am  . lactose free nutrition (BOOST) LIQD Take 237 mLs by mouth daily.   01/26/2019 at Unknown time  . meclizine (ANTIVERT) 25 MG tablet Take 25 mg by mouth daily.    01/27/2019 at am  . nitroGLYCERIN (NITROSTAT) 0.4 MG SL tablet Place 1 tablet (0.4 mg total) under the tongue every 5 (five) minutes as needed for chest pain (CP or SOB). (Patient taking differently: Place 0.4 mg under the tongue every 5 (five)  minutes as needed (chest pain or shortness of breath). ) 20 tablet 0 01/27/2019 at 800  . pantoprazole (PROTONIX) 40 MG tablet Take 40 mg by mouth daily.   01/27/2019 at am  . potassium chloride SA (K-DUR,KLOR-CON) 20 MEQ tablet TAKE TWO TABLETS BY MOUTH DAILY (Patient taking differently: Take 20 mEq by mouth daily. ) 180 tablet 2 01/27/2019 at am  . tamsulosin (FLOMAX) 0.4 MG CAPS capsule Take 0.4 mg by mouth at bedtime.   01/26/2019 at pm  . HYDROcodone-acetaminophen (NORCO/VICODIN) 5-325 MG tablet Take 1 tablet by mouth every 12 (twelve) hours as needed for moderate pain or severe pain (pain). (Patient not taking: Reported on 01/27/2019) 10 tablet 0 Not Taking at Unknown time  . rosuvastatin (CRESTOR) 10 MG tablet Take 10 mg by mouth every Monday, Wednesday, and Friday.    01/23/2019  . senna (SENOKOT) 8.6 MG TABS tablet Take 1 tablet (8.6 mg total) by  mouth daily. (Patient not taking: Reported on 01/27/2019) 30 each 0 Not Taking at Unknown time   Scheduled:  . [START ON 01/29/2019] amiodarone  100 mg Oral Daily  . aspirin EC  81 mg Oral Daily  . bisoprolol  2.5 mg Oral Daily  . furosemide  40 mg Intravenous Q12H  . isosorbide mononitrate  60 mg Oral Daily  . pantoprazole  40 mg Oral Daily  . potassium chloride SA  40 mEq Oral Daily  . rosuvastatin  10 mg Oral q1800  . sodium chloride flush  3 mL Intravenous Q12H  . tamsulosin  0.4 mg Oral QHS    Assessment: 78 yo male with unstable angina with plans for cath on Monday. He is on apixaban PTA for afib (last dose was 2/7 at ~ 9pm in the hospital). Pharmacy consulted to dose heparin for r/o ACS.  -Hg= 13 on 2/7, plt= 237   Goal of Therapy:  Heparin level 0.3-0.7 units/ml Monitor platelets by anticoagulation protocol: Yes   Plan:  -No heparin bolus -Begin heparin at 900 units/hr -Heparin level and aPTT in 6 hours and daily wth CBC daily  Hildred Laser, PharmD Clinical Pharmacist **Pharmacist phone directory can now be found on [email or web address] ([email or web address]).  Listed under [place].

## 2019-01-28 NOTE — Progress Notes (Addendum)
Progress Note  Patient Name: Ryan [name] Date of Encounter: 01/28/2019  Primary Cardiologist: Mertie Moores, MD   Subjective   Feels better today and denies any chest pain since admission.  SOB improved.  Inpatient Medications    Scheduled Meds: . amiodarone  200 mg Oral Daily  . aspirin EC  81 mg Oral Daily  . bisoprolol  2.5 mg Oral Daily  . enoxaparin (LOVENOX) injection  40 mg Subcutaneous Q24H  . furosemide  20 mg Oral Daily  . isosorbide mononitrate  60 mg Oral Daily  . pantoprazole  40 mg Oral Daily  . potassium chloride SA  40 mEq Oral Daily  . sodium chloride flush  3 mL Intravenous Q12H  . tamsulosin  0.4 mg Oral QHS   Continuous Infusions: . sodium chloride     PRN Meds: sodium chloride, acetaminophen, meclizine, nitroGLYCERIN, ondansetron (ZOFRAN) IV, sodium chloride flush   Vital Signs    Vitals:   01/27/19 1500 01/27/19 1942 01/28/19 0400 01/28/19 1424  BP:  (!) 165/79 (!) 105/51 (!) 132/52  Pulse:  (!) 25 (!) 49 (!) 56  Resp:  (!) 9 15 18   Temp:  97.7 F (36.5 C) 97.9 F (36.6 C) 97.7 F (36.5 C)  TempSrc:  Oral Oral Oral  SpO2:  97% 94% 97%  Weight: 75.5 kg  74.3 kg   Height:        Intake/Output Summary (Last 24 hours) at 01/28/2019 1616 Last data filed at 01/28/2019 1300 Gross per 24 hour  Intake 396 ml  Output -  Net 396 ml   Filed Weights   01/27/19 1500 01/28/19 0400  Weight: 75.5 kg 74.3 kg    Telemetry    Sinus bradycardia - Personally Reviewed  ECG    Sinus bradycardia 54 bpm with nonspecific ST abnormality and prolonged QTC at 506 ms- Personally Reviewed  Physical Exam   GEN: No acute distress.   Neck: No JVD Cardiac: RRR, no murmurs, rubs, or gallops.  Respiratory: Clear to auscultation bilaterally. GI: Soft, nontender, non-distended  MS: No edema; No deformity. Neuro:  Nonfocal  Psych: Normal affect   Labs    Chemistry Recent Labs  Lab 01/27/19 1519 01/28/19 0251  NA 141 140  K 4.7 4.0  CL 106 104    CO2 25 25  GLUCOSE 131* 91  BUN 16 14  CREATININE 1.30* 1.18  CALCIUM 9.2 8.8*  PROT 6.8  --   ALBUMIN 3.5  --   AST 24  --   ALT 14  --   ALKPHOS 47  --   BILITOT 1.3*  --   GFRNONAA 53* 59*  GFRAA >60 >60  ANIONGAP 10 11     Hematology Recent Labs  Lab 01/27/19 1519  WBC 7.5  RBC 4.26  HGB 13.0  HCT 40.9  MCV 96.0  MCH 30.5  MCHC 31.8  RDW 14.1  PLT 237    Cardiac Enzymes Recent Labs  Lab 01/27/19 1519 01/27/19 2113 01/28/19 0251  TROPONINI 0.04* 0.05* 0.06*   No results for input(s): TROPIPOC in the last 168 hours.   BNP Recent Labs  Lab 01/27/19 1519  BNP 776.4*     DDimer No results for input(s): DDIMER in the last 168 hours.   Radiology    Dg Chest 2 View  Result Date: 01/27/2019 CLINICAL DATA:  78 year old male with substernal chest pain, nonproductive cough and shortness of breath. Recently on antibiotics for pneumonia. EXAM: CHEST - 2 VIEW COMPARISON:  04/24/2018 and earlier. FINDINGS: Stable mild cardiomegaly and mediastinal contours. Stable prior CABG. Stable lung volumes. Visualized tracheal air column is within normal limits. There is subtle diffuse increased interstitial opacity today, particularly when compared to 02/07/2018. No pleural effusion. No pneumothorax or confluent pulmonary opacity. Chronic postoperative changes at the left shoulder stable along with osteopenia. Stable visualized osseous structures. Chronic surgical clips in the upper abdomen. Negative visible bowel gas pattern. IMPRESSION: Stable aside from subtle increased pulmonary interstitial markings since February 2019. Top differential considerations include viral/atypical respiratory infection, developing interstitial edema, and progressed chronic lung disease. Electronically Signed   By: [name] M.D.   On: 01/27/2019 18:24    Cardiac Studies   2D echo 01/28/2019 IMPRESSIONS   1. The left ventricle has low normal systolic function of 84-16%. The cavity size was normal.  There is no increased left ventricular wall thickness. Left ventricular diastology could not be evaluated due to indeterminent diastolic function. Elevated  left ventricular end-diastolic pressure The E/e' is 60.  2. The right ventricle has mildly reduced systolic function. The cavity was mildly enlarged. There is no increase in right ventricular wall thickness. Right ventricular systolic pressure could not be assessed.  3. The tricuspid valve is normal in structure. Tricuspid valve regurgitation is mild-moderate.  4. There is moderate calcification of the aortic valve without aortic stenosis.  5. The mitral valve is normal in structure. There is mild thickening and mild calcification.  Cardiac Cath 04/2018 Conclusion     Prox RCA to Mid RCA lesion is 100% stenosed.  Ost 1st Diag lesion is 99% stenosed.  1st Diag lesion is 90% stenosed.  Ost Ramus to Ramus lesion is 75% stenosed.  Ost Cx to Mid Cx lesion is 70% stenosed.  Mid Cx to Dist Cx lesion is 100% stenosed.  Prox LAD to Mid LAD lesion is 100% stenosed.  LIMA graft was visualized by angiography and is normal in caliber.  The graft exhibits no disease.  SVG.  Origin to Prox Graft lesion is 100% stenosed.  SVG.  Origin to Prox Graft lesion is 100% stenosed.   1. Severe 3 vessel obstructive CAD 2. Patent LIMA to the LAD. 3. By operative note there was a SVG Y graft to first and second diagonal branches- I suspect one of these branches was the ramus branch. The SVG is occluded. 4. Occluded SVG to PDA  Plan: recommend medical therapy. There are no good targets for PCI.    Patient Profile     78 y.o. male with a history of known CAD with multiple MIs in the past ultimately ending up with CABG in 2016.  He also has a history of combined chronic systolic and diastolic CHF, carotid artery disease, hypertension, hyperlipidemia and paroxysmal atrial fibrillation she has been maintained on amiodarone and apixaban admitted  with unstable angina.  Assessment & Plan    1.  Coronary artery disease involving native coronary artery of native heart with unstable angina pectoris ([place]) -Hx of prior myocardial infarctions and subsequent CABG in 2016.  '- -Cardiac Catheterization in 5/19 demonstrated that both vein grafts were occluded.  The L-LAD was patent.  There were no targets for PCI.   -Now admitted with progressive dyspnea on exertion and exertional chest pain responsive to sublingual nitroglycerin consistent with unstable angina  -Shortness of breath initially felt to be due to bronchitis or pneumonia but did not improve with antibiotic treatment  -EKG in office unchanged I did not show any ischemic changes  and he does not appear volume overloaded on exam.  -The morning of admission he had pain at rest which prompted office visit.  -Troponins are minimally elevated with flat trend at 0.04<0.05<0.06 -Despite last cath showing no targets for PCI he is now having worsening progressive symptoms of chest pain consistent with unstable angina and recommend proceeding with right and left heart catheterization on Monday. -Make n.p.o. after midnight on Sunday night -Continue aspirin 81 mg daily, Imdur 60 mg daily, bisoprolol 2.5 mg daily and statin  2.  Acute on chronic combined systolic and diastolic CHF (congestive heart failure) ([place])  -EF 40-45 by Echo in 2/19.   -Appears chronically to be NYHA 3  -He does have increased dyspnea and hard to decide whether this is an anginal equivalent or volume overload.  - He did not appear volume overloaded on exam in the office  -BNP was elevated at 776 and chest x-ray showed increased pulmonary interstitial markings since February consistent with either developing interstitial edema, progressive chronic lung disease or atypical respiratory infection.  Plan on R/L heart cath on Monday.  -2D echo done today showed low normal LV function with EF 50 to 55% which is actually improved  from echo a year ago.  There appeared to be elevated LV filling pressures with E/e' 22 consistent with volume overload.  He also had evidence of mildly reduced RV systolic function with L RV enlargement.  PASP could not be determined.  There was also  mild to moderate TR -I&O's are incomplete since admission -will start Lasix 40 mg IV twice daily -Continue to follow I&O's as well as daily weights -Creatinine 1.18 today we will follow while diuresing  3.  PAF (paroxysmal atrial fibrillation) (HCC)  -Maintaining sinus brady -Hold Eliquis starting today for cath on Monday.  -Start IV heparin per pharmacy -continue on Bisoprolol 2.5mg  daily -Decrease amiodarone to 100 mg daily due to prolonged QTc is at 506 ms -Follow daily EKG for QTC  4.  Bilateral carotid artery disease, unspecified type ([place])  -Continue follow up with [facility]. -continue on ASA and statin.   5.  Essential hypertension -continue bisoprolol 2.5mg  daily  6.  History of pneumonia  -Apparently resolved on CXR earlier this week.  7.  Dizziness  -He describes symptoms of vertigo.   -He should follow up with his PCP to further evaluate this.  8.  Chronic Kidney Disease stage 2 -creatinine stable at 1.18 -follow closely post cath  I have spent a total of 40 minutes with patient reviewing 2D echo, prior cath , telemetry, EKGs, labs and examining patient as well as establishing an assessment and plan that was discussed with the patient.  > 50% of time was spent in direct patient care.    For questions or updates, please contact [place] Please consult [email or web address] for contact info under Cardiology/STEMI.      Signed, Fransico Him, MD  01/28/2019, 4:16 PM

## 2019-01-28 NOTE — Progress Notes (Signed)
  Echocardiogram 2D Echocardiogram has been performed.  Ryan [name] 01/28/2019, 2:28 PM

## 2019-01-29 LAB — APTT
aPTT: 52 seconds — ABNORMAL HIGH (ref 24–36)
aPTT: 76 seconds — ABNORMAL HIGH (ref 24–36)
aPTT: 94 seconds — ABNORMAL HIGH (ref 24–36)

## 2019-01-29 LAB — HEPARIN LEVEL (UNFRACTIONATED): Heparin Unfractionated: 1.4 IU/mL — ABNORMAL HIGH (ref 0.30–0.70)

## 2019-01-29 LAB — CBC
HCT: 42.2 % (ref 39.0–52.0)
HEMOGLOBIN: 13.8 g/dL (ref 13.0–17.0)
MCH: 31.4 pg (ref 26.0–34.0)
MCHC: 32.7 g/dL (ref 30.0–36.0)
MCV: 96.1 fL (ref 80.0–100.0)
Platelets: 276 10*3/uL (ref 150–400)
RBC: 4.39 MIL/uL (ref 4.22–5.81)
RDW: 13.8 % (ref 11.5–15.5)
WBC: 7.9 10*3/uL (ref 4.0–10.5)
nRBC: 0 % (ref 0.0–0.2)

## 2019-01-29 MED ORDER — SENNOSIDES-DOCUSATE SODIUM 8.6-50 MG PO TABS
1.0000 | ORAL_TABLET | Freq: Every evening | ORAL | Status: DC | PRN
  Administered 2019-01-29 – 2019-01-30 (×2): 1 via ORAL
  Filled 2019-01-29 (×2): qty 1

## 2019-01-29 MED ORDER — SODIUM CHLORIDE 0.9% FLUSH: 3.0000 mL | Freq: Two times a day (BID) | INTRAVENOUS | Status: DC

## 2019-01-29 MED ORDER — POLYETHYLENE GLYCOL 3350 17 G PO PACK
34.0000 g | PACK | Freq: Every day | ORAL | Status: DC
  Administered 2019-01-29 – 2019-01-31 (×2): 34 g via ORAL
  Filled 2019-01-29 (×2): qty 2

## 2019-01-29 MED ORDER — SODIUM CHLORIDE 0.9% FLUSH: 3.0000 mL | INTRAVENOUS | Status: DC | PRN

## 2019-01-29 MED ORDER — SODIUM CHLORIDE 0.9 % WEIGHT BASED INFUSION
3.0000 mL/kg/h | INTRAVENOUS | Status: DC
  Administered 2019-01-30: 3 mL/kg/h via INTRAVENOUS

## 2019-01-29 MED ORDER — ASPIRIN 81 MG PO CHEW
81.0000 mg | CHEWABLE_TABLET | ORAL | Status: AC
  Administered 2019-01-30: 81 mg via ORAL
  Filled 2019-01-29: qty 1

## 2019-01-29 MED ORDER — POLYETHYLENE GLYCOL (MIRALAX) NICU SYRINGE 0.34GM/ML
0.5000 g/kg | ORAL | Status: DC
  Filled 2019-01-29: qty 108

## 2019-01-29 MED ORDER — SODIUM CHLORIDE 0.9 % IV SOLN: 250.0000 mL | INTRAVENOUS | Status: DC | PRN

## 2019-01-29 MED ORDER — SODIUM CHLORIDE 0.9 % WEIGHT BASED INFUSION
1.0000 mL/kg/h | INTRAVENOUS | Status: DC
  Administered 2019-01-30: 1 mL/kg/h via INTRAVENOUS

## 2019-01-29 NOTE — Progress Notes (Signed)
Progress Note  Patient Name: Ryan [name] Date of Encounter: 01/29/2019  Primary Cardiologist: Mertie Moores, MD   Subjective   He has not had any further CP or SOB.  Feels much better  Inpatient Medications    Scheduled Meds: . amiodarone  100 mg Oral Daily  . aspirin EC  81 mg Oral Daily  . bisoprolol  2.5 mg Oral Daily  . furosemide  40 mg Intravenous Q12H  . isosorbide mononitrate  60 mg Oral Daily  . pantoprazole  40 mg Oral Daily  . potassium chloride SA  40 mEq Oral Daily  . rosuvastatin  10 mg Oral q1800  . sodium chloride flush  3 mL Intravenous Q12H  . tamsulosin  0.4 mg Oral QHS   Continuous Infusions: . sodium chloride    . heparin 1,150 Units/hr (01/29/19 0842)   PRN Meds: sodium chloride, acetaminophen, meclizine, nitroGLYCERIN, ondansetron (ZOFRAN) IV, senna-docusate, sodium chloride flush   Vital Signs    Vitals:   01/28/19 1424 01/28/19 2016 01/29/19 0453 01/29/19 0457  BP: (!) 132/52 (!) 125/57    Pulse: (!) 56 (!) 59    Resp: 18 18 (!) 27 (!) 21  Temp: 97.7 F (36.5 C) 97.6 F (36.4 C)    TempSrc: Oral Oral    SpO2: 97% 97%    Weight:   73.6 kg   Height:        Intake/Output Summary (Last 24 hours) at 01/29/2019 1308 Last data filed at 01/29/2019 0786 Gross per 24 hour  Intake 344.54 ml  Output 300 ml  Net 44.54 ml   Filed Weights   01/27/19 1500 01/28/19 0400 01/29/19 0453  Weight: 75.5 kg 74.3 kg 73.6 kg    Telemetry    Sinus bradycardia- Personally Reviewed  ECG    No new EKG to review- Personally Reviewed  Physical Exam   GEN: Well nourished, well developed in no acute distress HEENT: Normal NECK: No JVD; No carotid bruits LYMPHATICS: No lymphadenopathy CARDIAC:RRR, no murmurs, rubs, gallops RESPIRATORY:  Clear to auscultation without rales, wheezing or rhonchi  ABDOMEN: Soft, non-tender, non-distended MUSCULOSKELETAL:  No edema; No deformity  SKIN: Warm and dry NEUROLOGIC:  Alert and oriented x 3 PSYCHIATRIC:   Normal affect    Labs    Chemistry Recent Labs  Lab 01/27/19 1519 01/28/19 0251  NA 141 140  K 4.7 4.0  CL 106 104  CO2 25 25  GLUCOSE 131* 91  BUN 16 14  CREATININE 1.30* 1.18  CALCIUM 9.2 8.8*  PROT 6.8  --   ALBUMIN 3.5  --   AST 24  --   ALT 14  --   ALKPHOS 47  --   BILITOT 1.3*  --   GFRNONAA 53* 59*  GFRAA >60 >60  ANIONGAP 10 11     Hematology Recent Labs  Lab 01/27/19 1519 01/29/19 0600  WBC 7.5 7.9  RBC 4.26 4.39  HGB 13.0 13.8  HCT 40.9 42.2  MCV 96.0 96.1  MCH 30.5 31.4  MCHC 31.8 32.7  RDW 14.1 13.8  PLT 237 276    Cardiac Enzymes Recent Labs  Lab 01/27/19 1519 01/27/19 2113 01/28/19 0251  TROPONINI 0.04* 0.05* 0.06*   No results for input(s): TROPIPOC in the last 168 hours.   BNP Recent Labs  Lab 01/27/19 1519  BNP 776.4*     DDimer No results for input(s): DDIMER in the last 168 hours.   Radiology    Dg Chest 2 View  Result Date: 01/27/2019 CLINICAL DATA:  78 year old male with substernal chest pain, nonproductive cough and shortness of breath. Recently on antibiotics for pneumonia. EXAM: CHEST - 2 VIEW COMPARISON:  04/24/2018 and earlier. FINDINGS: Stable mild cardiomegaly and mediastinal contours. Stable prior CABG. Stable lung volumes. Visualized tracheal air column is within normal limits. There is subtle diffuse increased interstitial opacity today, particularly when compared to 02/07/2018. No pleural effusion. No pneumothorax or confluent pulmonary opacity. Chronic postoperative changes at the left shoulder stable along with osteopenia. Stable visualized osseous structures. Chronic surgical clips in the upper abdomen. Negative visible bowel gas pattern. IMPRESSION: Stable aside from subtle increased pulmonary interstitial markings since February 2019. Top differential considerations include viral/atypical respiratory infection, developing interstitial edema, and progressed chronic lung disease. Electronically Signed   By: [name]  M.D.   On: 01/27/2019 18:24    Cardiac Studies   2D echo 01/28/2019 IMPRESSIONS   1. The left ventricle has low normal systolic function of 24-26%. The cavity size was normal. There is no increased left ventricular wall thickness. Left ventricular diastology could not be evaluated due to indeterminent diastolic function. Elevated  left ventricular end-diastolic pressure The E/e' is 44.  2. The right ventricle has mildly reduced systolic function. The cavity was mildly enlarged. There is no increase in right ventricular wall thickness. Right ventricular systolic pressure could not be assessed.  3. The tricuspid valve is normal in structure. Tricuspid valve regurgitation is mild-moderate.  4. There is moderate calcification of the aortic valve without aortic stenosis.  5. The mitral valve is normal in structure. There is mild thickening and mild calcification.  Cardiac Cath 04/2018 Conclusion     Prox RCA to Mid RCA lesion is 100% stenosed.  Ost 1st Diag lesion is 99% stenosed.  1st Diag lesion is 90% stenosed.  Ost Ramus to Ramus lesion is 75% stenosed.  Ost Cx to Mid Cx lesion is 70% stenosed.  Mid Cx to Dist Cx lesion is 100% stenosed.  Prox LAD to Mid LAD lesion is 100% stenosed.  LIMA graft was visualized by angiography and is normal in caliber.  The graft exhibits no disease.  SVG.  Origin to Prox Graft lesion is 100% stenosed.  SVG.  Origin to Prox Graft lesion is 100% stenosed.   1. Severe 3 vessel obstructive CAD 2. Patent LIMA to the LAD. 3. By operative note there was a SVG Y graft to first and second diagonal branches- I suspect one of these branches was the ramus branch. The SVG is occluded. 4. Occluded SVG to PDA  Plan: recommend medical therapy. There are no good targets for PCI.    Patient Profile     78 y.o. male with a history of known CAD with multiple MIs in the past ultimately ending up with CABG in 2016.  He also has a history of combined  chronic systolic and diastolic CHF, carotid artery disease, hypertension, hyperlipidemia and paroxysmal atrial fibrillation she has been maintained on amiodarone and apixaban admitted with unstable angina.  Assessment & Plan    1.  Coronary artery disease involving native coronary artery of native heart with unstable angina pectoris ([place]) -Hx of prior myocardial infarctions and subsequent CABG in 2016.  '- -Cardiac Catheterization in 5/19 demonstrated that both vein grafts were occluded.  The L-LAD was patent.  There were no targets for PCI.   -Now admitted with progressive dyspnea on exertion and exertional chest pain responsive to sublingual nitroglycerin consistent with unstable angina  -Shortness  of breath initially felt to be due to bronchitis or pneumonia but did not improve with antibiotic treatment  -EKG in office unchanged I did not show any ischemic changes and he does not appear volume overloaded on exam.  -The morning of admission he had pain at rest which prompted office visit.  -Troponins are minimally elevated with flat trend at 0.04<0.05<0.06 -Despite last cath showing no targets for PCI he is now having worsening progressive symptoms of chest pain consistent with unstable angina and recommend proceeding with right and left heart catheterization on Monday. -He is NPO tonight for R and L heart cath in am -Continue aspirin 81 mg daily, Imdur 60 mg daily, bisoprolol 2.5 mg daily and statin  2.  Acute on chronic combined systolic and diastolic CHF (congestive heart failure) ([place])  -EF 40-45 by Echo in 2/19.   -Appears chronically to be NYHA 3  -He does have increased dyspnea and hard to decide whether this is an anginal equivalent or volume overload.  - He did not appear volume overloaded on exam in the office  -BNP was elevated at 776 and chest x-ray showed increased pulmonary interstitial markings since February consistent with either developing interstitial edema, progressive  chronic lung disease or atypical respiratory infection.  Plan on R/L heart cath on Monday.  -2D echo done today showed low normal LV function with EF 50 to 55% which is actually improved from echo a year ago.  There appeared to be elevated LV filling pressures with E/e' 22 consistent with volume overload.  He also had evidence of mildly reduced RV systolic function with L RV enlargement.  PASP could not be determined.  There was also  mild to moderate TR -I&O's are incomplete since admission -continue IV Lasix 40 mg twice daily -Continue to follow I&O's as well as daily weights -Creatinine 1.18 yesterday - BMET pending today  3.  PAF (paroxysmal atrial fibrillation) (HCC)  -Maintaining sinus brady -Hold Eliquis starting today for cath on Monday.  -continue IV heparin per pharmacy -continue on Bisoprolol 2.5mg  daily -Decreased amiodarone to 100 mg daily yesterday due to prolonged QTc is at 506 ms -Follow daily EKG for QTC  4.  Bilateral carotid artery disease, unspecified type ([place])  -Continue follow up with [facility]. -continue on ASA and statin.   5.  Essential hypertension -continue bisoprolol 2.5mg  daily  6.  History of pneumonia  -Apparently resolved on CXR earlier this week.  7.  Dizziness  -He describes symptoms of vertigo.   -He should follow up with his PCP to further evaluate this.  8.  Chronic Kidney Disease stage 2 -creatinine stable at 1.18 yesterday -follow closely post cath  9.  Hyperlipidemia -he has had ? Scapular pain on Crestor -Crestor restarted yesterday and will see how he does   For questions or updates, please contact [place] Please consult [email or web address] for contact info under Cardiology/STEMI.      Signed, Fransico Him, MD  01/29/2019, 1:08 PM

## 2019-01-29 NOTE — Progress Notes (Signed)
ANTICOAGULATION CONSULT NOTE - Follow-up Consult  Pharmacy Consult for Heparin  Indication: atrial fibrillation  Allergies  Allergen Reactions  . Codeine Nausea And Vomiting       . Zetia [Ezetimibe] Other (See Comments)    Myalgias   . Unithroid [Levothyroxine Sodium] Other (See Comments)    Caused blurry vision per pt    Patient Measurements: Height: 5\' 6"  (167.6 cm) Weight: 162 lb 4.8 oz (73.6 kg) IBW/kg (Calculated) : 63.8  Heparin Dosing Wt: 73.6   Vital Signs: Temp: 97.6 F (36.4 C) (02/08 2016) Temp Source: Oral (02/08 2016) BP: 125/57 (02/08 2016) Pulse Rate: 59 (02/08 2016)  Labs: Recent Labs    01/27/19 1519 01/27/19 2113 01/28/19 0251 01/28/19 2334 01/29/19 0600  HGB 13.0  --   --   --  13.8  HCT 40.9  --   --   --  42.2  PLT 237  --   --   --  276  APTT  --   --   --  52*  --   HEPARINUNFRC  --   --   --  1.40*  --   CREATININE 1.30*  --  1.18  --   --   TROPONINI 0.04* 0.05* 0.06*  --   --     Estimated Creatinine Clearance: 47.3 mL/min (by C-G formula based on SCr of 1.18 mg/dL).  Assessment: 78 yo male with unstable angina with plans for cath on Monday. He is on apixaban PTA for afib (last dose was 2/7 at ~ 9pm in the hospital). Pharmacy consulted to dose heparin for r/o ACS.   aPTT this morning is therapeutic after a rate increase earlier today however remains on the lower end of the range (aPTT 76 << 52, goal of 66-102 seconds). Will increase slightly to keep within range and wil follow-up with an aPTT to confirm this afternoon. No issues with the drip or bleeding noted per RN.   Goal of Therapy:  PTT 66-102 sec Heparin level 0.3-0.7 units/ml Monitor platelets by anticoagulation protocol: Yes   Plan:  - Increase Heparin drip slightly to 1150 units/hr (11.5 ml/hr) to keep within range - Will continue to monitor for any signs/symptoms of bleeding and will follow up with aPTT level in 8 hours to confirm  Thank you for allowing pharmacy to be  a part of this patient's care.  Alycia Rossetti, PharmD, BCPS Clinical Pharmacist Clinical phone for 01/29/2019: [phone] 01/29/2019 8:36 AM   **Pharmacist phone directory can now be found on [email or web address] ([email or web address]).  Listed under [place].

## 2019-01-29 NOTE — Progress Notes (Signed)
ANTICOAGULATION CONSULT NOTE - Follow-up Consult  Pharmacy Consult for heparin  Indication: atrial fibrillation  Allergies  Allergen Reactions  . Codeine Nausea And Vomiting       . Zetia [Ezetimibe] Other (See Comments)    Myalgias   . Unithroid [Levothyroxine Sodium] Other (See Comments)    Caused blurry vision per pt    Patient Measurements: Height: 5\' 6"  (167.6 cm) Weight: 163 lb 12.8 oz (74.3 kg) IBW/kg (Calculated) : 63.8   Vital Signs: Temp: 97.6 F (36.4 C) (02/08 2016) Temp Source: Oral (02/08 2016) BP: 125/57 (02/08 2016) Pulse Rate: 59 (02/08 2016)  Labs: Recent Labs    01/27/19 1519 01/27/19 2113 01/28/19 0251 01/28/19 2334  HGB 13.0  --   --   --   HCT 40.9  --   --   --   PLT 237  --   --   --   APTT  --   --   --  52*  HEPARINUNFRC  --   --   --  1.40*  CREATININE 1.30*  --  1.18  --   TROPONINI 0.04* 0.05* 0.06*  --     Estimated Creatinine Clearance: 47.3 mL/min (by C-G formula based on SCr of 1.18 mg/dL).  Assessment: 78 yo male with unstable angina with plans for cath on Monday. He is on apixaban PTA for afib (last dose was 2/7 at ~ 9pm in the hospital). Pharmacy consulted to dose heparin for r/o ACS.   PTT 52 sec (subtherapeutic) on gtt at 900 units/hr. Utilizing PTT while apixaban elevating heparin level. No issues with line or bleeding reported per RN.  Goal of Therapy:  PTT 66-102 sec Heparin level 0.3-0.7 units/ml Monitor platelets by anticoagulation protocol: Yes   Plan:  Increase heparin to 1100 units/hr Will f/u PTT in 6 hours  Sherlon Handing, PharmD, BCPS Clinical pharmacist  **Pharmacist phone directory can now be found on [email or web address] ([email or web address]).  Listed under [place]. 01/29/2019 12:43 AM

## 2019-01-29 NOTE — Progress Notes (Signed)
ANTICOAGULATION CONSULT NOTE - Follow-up Consult  Pharmacy Consult for Heparin  Indication: atrial fibrillation  Allergies  Allergen Reactions  . Codeine Nausea And Vomiting       . Zetia [Ezetimibe] Other (See Comments)    Myalgias   . Unithroid [Levothyroxine Sodium] Other (See Comments)    Caused blurry vision per pt    Patient Measurements: Height: 5\' 6"  (167.6 cm) Weight: 162 lb 4.8 oz (73.6 kg) IBW/kg (Calculated) : 63.8  Heparin Dosing Wt: 73.6   Vital Signs:    Labs: Recent Labs    01/27/19 1519 01/27/19 2113 01/28/19 0251 01/28/19 2334 01/29/19 0600 01/29/19 1621  HGB 13.0  --   --   --  13.8  --   HCT 40.9  --   --   --  42.2  --   PLT 237  --   --   --  276  --   APTT  --   --   --  52* 76* 94*  HEPARINUNFRC  --   --   --  1.40*  --   --   CREATININE 1.30*  --  1.18  --   --   --   TROPONINI 0.04* 0.05* 0.06*  --   --   --     Estimated Creatinine Clearance: 47.3 mL/min (by C-G formula based on SCr of 1.18 mg/dL).  Assessment: 78 yo male with unstable angina with plans for cath on Monday. He is on apixaban PTA for afib (last dose was 2/7 at ~ 9pm in the hospital). Pharmacy consulted to dose heparin for r/o ACS.  -aPTT= 94 on 1150 units/hr   Goal of Therapy:  PTT 66-102 sec Heparin level 0.3-0.7 units/ml Monitor platelets by anticoagulation protocol: Yes   Plan:  -No heparin changes needed -Daily heparin level and CBC  Hildred Laser, PharmD Clinical Pharmacist **Pharmacist phone directory can now be found on [email or web address] ([email or web address]).  Listed under [place].

## 2019-01-30 ENCOUNTER — Encounter (HOSPITAL_COMMUNITY)
Admission: AD | Disposition: A | Discharge status: Home / Self Care | Payer: Self-pay | Source: Home / Self Care | Attending: Cardiology

## 2019-01-30 DIAGNOSIS — I25710 Atherosclerosis of autologous vein coronary artery bypass graft(s) with unstable angina pectoris: Secondary | ICD-10-CM

## 2019-01-30 HISTORY — PX: RIGHT HEART CATH AND CORONARY/GRAFT ANGIOGRAPHY: CATH118265

## 2019-01-30 LAB — POCT I-STAT EG7
Acid-Base Excess: 1 mmol/L (ref 0.0–2.0)
Bicarbonate: 25.3 mmol/L (ref 20.0–28.0)
Bicarbonate: 25.5 mmol/L (ref 20.0–28.0)
Calcium, Ion: 1.14 mmol/L — ABNORMAL LOW (ref 1.15–1.40)
Calcium, Ion: 1.18 mmol/L (ref 1.15–1.40)
HCT: 39 % (ref 39.0–52.0)
HEMATOCRIT: 38 % — AB (ref 39.0–52.0)
HEMOGLOBIN: 13.3 g/dL (ref 13.0–17.0)
Hemoglobin: 12.9 g/dL — ABNORMAL LOW (ref 13.0–17.0)
O2 Saturation: 68 %
O2 Saturation: 72 %
POTASSIUM: 4.1 mmol/L (ref 3.5–5.1)
Potassium: 4.2 mmol/L (ref 3.5–5.1)
SODIUM: 140 mmol/L (ref 135–145)
Sodium: 140 mmol/L (ref 135–145)
TCO2: 27 mmol/L (ref 22–32)
TCO2: 27 mmol/L (ref 22–32)
pCO2, Ven: 40.4 mmHg — ABNORMAL LOW (ref 44.0–60.0)
pCO2, Ven: 40.8 mmHg — ABNORMAL LOW (ref 44.0–60.0)
pH, Ven: 7.401 (ref 7.250–7.430)
pH, Ven: 7.409 (ref 7.250–7.430)
pO2, Ven: 36 mmHg (ref 32.0–45.0)
pO2, Ven: 38 mmHg (ref 32.0–45.0)

## 2019-01-30 LAB — BASIC METABOLIC PANEL
Anion gap: 11 (ref 5–15)
BUN: 21 mg/dL (ref 8–23)
CHLORIDE: 102 mmol/L (ref 98–111)
CO2: 25 mmol/L (ref 22–32)
Calcium: 9.1 mg/dL (ref 8.9–10.3)
Creatinine, Ser: 1.55 mg/dL — ABNORMAL HIGH (ref 0.61–1.24)
GFR calc Af Amer: 49 mL/min — ABNORMAL LOW (ref >60)
GFR calc non Af Amer: 43 mL/min — ABNORMAL LOW (ref >60)
Glucose, Bld: 116 mg/dL — ABNORMAL HIGH (ref 70–99)
POTASSIUM: 4.1 mmol/L (ref 3.5–5.1)
Sodium: 138 mmol/L (ref 135–145)

## 2019-01-30 LAB — POCT I-STAT 7, (LYTES, BLD GAS, ICA,H+H)
Acid-base deficit: 2 mmol/L (ref 0.0–2.0)
Bicarbonate: 22.7 mmol/L (ref 20.0–28.0)
Calcium, Ion: 1.14 mmol/L — ABNORMAL LOW (ref 1.15–1.40)
HCT: 38 % — ABNORMAL LOW (ref 39.0–52.0)
Hemoglobin: 12.9 g/dL — ABNORMAL LOW (ref 13.0–17.0)
O2 SAT: 97 %
PO2 ART: 94 mmHg (ref 83.0–108.0)
Potassium: 4.1 mmol/L (ref 3.5–5.1)
Sodium: 136 mmol/L (ref 135–145)
TCO2: 24 mmol/L (ref 22–32)
pCO2 arterial: 37.8 mmHg (ref 32.0–48.0)
pH, Arterial: 7.386 (ref 7.350–7.450)

## 2019-01-30 LAB — CBC
HCT: 39 % (ref 39.0–52.0)
Hemoglobin: 13 g/dL (ref 13.0–17.0)
MCH: 31.7 pg (ref 26.0–34.0)
MCHC: 33.3 g/dL (ref 30.0–36.0)
MCV: 95.1 fL (ref 80.0–100.0)
NRBC: 0 % (ref 0.0–0.2)
Platelets: 257 10*3/uL (ref 150–400)
RBC: 4.1 MIL/uL — AB (ref 4.22–5.81)
RDW: 14 % (ref 11.5–15.5)
WBC: 7.2 10*3/uL (ref 4.0–10.5)

## 2019-01-30 LAB — HEPARIN LEVEL (UNFRACTIONATED): Heparin Unfractionated: 0.94 IU/mL — ABNORMAL HIGH (ref 0.30–0.70)

## 2019-01-30 LAB — APTT: aPTT: 77 seconds — ABNORMAL HIGH (ref 24–36)

## 2019-01-30 LAB — POCT ACTIVATED CLOTTING TIME: Activated Clotting Time: 180 seconds

## 2019-01-30 SURGERY — RIGHT HEART CATH AND CORONARY/GRAFT ANGIOGRAPHY
Anesthesia: LOCAL

## 2019-01-30 MED ORDER — NITROGLYCERIN 1 MG/10 ML FOR IR/CATH LAB
INTRA_ARTERIAL | Status: DC | PRN
  Administered 2019-01-30: 200 ug

## 2019-01-30 MED ORDER — MIDAZOLAM HCL 2 MG/2ML IJ SOLN
INTRAMUSCULAR | Status: DC | PRN
  Administered 2019-01-30: 1 mg via INTRAVENOUS

## 2019-01-30 MED ORDER — DIAZEPAM 5 MG PO TABS: 5.0000 mg | ORAL_TABLET | Freq: Four times a day (QID) | ORAL | Status: DC | PRN

## 2019-01-30 MED ORDER — VERAPAMIL HCL 2.5 MG/ML IV SOLN
INTRAVENOUS | Status: DC | PRN
  Administered 2019-01-30: 10 mL via INTRA_ARTERIAL

## 2019-01-30 MED ORDER — FENTANYL CITRATE (PF) 100 MCG/2ML IJ SOLN
INTRAMUSCULAR | Status: AC
  Filled 2019-01-30: qty 2

## 2019-01-30 MED ORDER — FENTANYL CITRATE (PF) 100 MCG/2ML IJ SOLN
INTRAMUSCULAR | Status: DC | PRN
  Administered 2019-01-30: 25 ug via INTRAVENOUS

## 2019-01-30 MED ORDER — SODIUM CHLORIDE 0.9 % IV SOLN: 250.0000 mL | INTRAVENOUS | Status: DC | PRN

## 2019-01-30 MED ORDER — APIXABAN 5 MG PO TABS
5.0000 mg | ORAL_TABLET | Freq: Two times a day (BID) | ORAL | Status: DC
  Administered 2019-01-31: 5 mg via ORAL
  Filled 2019-01-30: qty 1

## 2019-01-30 MED ORDER — NITROGLYCERIN 1 MG/10 ML FOR IR/CATH LAB
INTRA_ARTERIAL | Status: AC
  Filled 2019-01-30: qty 10

## 2019-01-30 MED ORDER — MIDAZOLAM HCL 2 MG/2ML IJ SOLN
INTRAMUSCULAR | Status: AC
  Filled 2019-01-30: qty 2

## 2019-01-30 MED ORDER — RANOLAZINE ER 500 MG PO TB12
500.0000 mg | ORAL_TABLET | Freq: Two times a day (BID) | ORAL | Status: DC
  Administered 2019-01-30: 500 mg via ORAL
  Filled 2019-01-30: qty 1

## 2019-01-30 MED ORDER — AMLODIPINE BESYLATE 5 MG PO TABS
5.0000 mg | ORAL_TABLET | Freq: Every day | ORAL | Status: DC
  Administered 2019-01-30: 5 mg via ORAL
  Filled 2019-01-30: qty 1

## 2019-01-30 MED ORDER — HEPARIN (PORCINE) IN NACL 1000-0.9 UT/500ML-% IV SOLN
INTRAVENOUS | Status: AC
  Filled 2019-01-30: qty 1000

## 2019-01-30 MED ORDER — LIDOCAINE HCL (PF) 1 % IJ SOLN
INTRAMUSCULAR | Status: AC
  Filled 2019-01-30: qty 30

## 2019-01-30 MED ORDER — HEPARIN SODIUM (PORCINE) 1000 UNIT/ML IJ SOLN
INTRAMUSCULAR | Status: DC | PRN
  Administered 2019-01-30: 3500 [IU] via INTRAVENOUS

## 2019-01-30 MED ORDER — ONDANSETRON HCL 4 MG/2ML IJ SOLN: 4.0000 mg | Freq: Four times a day (QID) | INTRAMUSCULAR | Status: DC | PRN

## 2019-01-30 MED ORDER — SODIUM CHLORIDE 0.9% FLUSH: 3.0000 mL | Freq: Two times a day (BID) | INTRAVENOUS | Status: DC

## 2019-01-30 MED ORDER — HEPARIN SODIUM (PORCINE) 1000 UNIT/ML IJ SOLN
INTRAMUSCULAR | Status: AC
  Filled 2019-01-30: qty 1

## 2019-01-30 MED ORDER — SODIUM CHLORIDE 0.9% FLUSH: 3.0000 mL | INTRAVENOUS | Status: DC | PRN

## 2019-01-30 MED ORDER — IOHEXOL 350 MG/ML SOLN
INTRAVENOUS | Status: DC | PRN
  Administered 2019-01-30: 55 mL via INTRA_ARTERIAL

## 2019-01-30 MED ORDER — ACETAMINOPHEN 325 MG PO TABS: 650.0000 mg | ORAL_TABLET | ORAL | Status: DC | PRN

## 2019-01-30 MED ORDER — LIDOCAINE HCL (PF) 1 % IJ SOLN
INTRAMUSCULAR | Status: DC | PRN
  Administered 2019-01-30: 20 mL via INTRADERMAL
  Administered 2019-01-30: 12 mL via INTRADERMAL
  Administered 2019-01-30: 2 mL via INTRADERMAL

## 2019-01-30 MED ORDER — SODIUM CHLORIDE 0.9 % IV SOLN: INTRAVENOUS | Status: AC

## 2019-01-30 MED ORDER — HEPARIN (PORCINE) IN NACL 1000-0.9 UT/500ML-% IV SOLN
INTRAVENOUS | Status: DC | PRN
  Administered 2019-01-30 (×2): 500 mL

## 2019-01-30 SURGICAL SUPPLY — 16 items
CATH INFINITI 5 FR IM (CATHETERS) ×1 IMPLANT
CATH INFINITI 5FR MULTPACK ANG (CATHETERS) ×1 IMPLANT
CATH SWAN GANZ 7F STRAIGHT (CATHETERS) ×1 IMPLANT
DEVICE RAD COMP TR BAND LRG (VASCULAR PRODUCTS) ×1 IMPLANT
GLIDESHEATH SLEND SS 6F .021 (SHEATH) ×1 IMPLANT
GUIDEWIRE INQWIRE 1.5J.035X260 (WIRE) IMPLANT
INQWIRE 1.5J .035X260CM (WIRE) ×2
KIT HEART LEFT (KITS) ×2 IMPLANT
PACK CARDIAC CATHETERIZATION (CUSTOM PROCEDURE TRAY) ×2 IMPLANT
SHEATH PINNACLE 5F 10CM (SHEATH) ×2 IMPLANT
SHEATH PINNACLE 7F 10CM (SHEATH) ×1 IMPLANT
SHEATH PROBE COVER 6X72 (BAG) ×1 IMPLANT
TRANSDUCER W/STOPCOCK (MISCELLANEOUS) ×2 IMPLANT
TUBING CIL FLEX 10 FLL-RA (TUBING) ×2 IMPLANT
WIRE EMERALD 3MM-J .035X150CM (WIRE) ×1 IMPLANT
WIRE HI TORQ VERSACORE-J 145CM (WIRE) ×1 IMPLANT

## 2019-01-30 NOTE — Interval H&P Note (Signed)
Cath Lab Visit (complete for each Cath Lab visit)  Clinical Evaluation Leading to the Procedure:   ACS: No.  Non-ACS:    Anginal Classification: CCS III  Anti-ischemic medical therapy: Maximal Therapy (2 or more classes of medications)  Non-Invasive Test Results: No non-invasive testing performed  Prior CABG: Previous CABG      History and Physical Interval Note:  01/30/2019 3:05 PM  Ryan [name]  has presented today for surgery, with the diagnosis of unstable angina  The various methods of treatment have been discussed with the patient and family. After consideration of risks, benefits and other options for treatment, the patient has consented to  Procedure(s): RIGHT/LEFT HEART CATH AND CORONARY/GRAFT ANGIOGRAPHY (N/A) as a surgical intervention .  The patient's history has been reviewed, patient examined, no change in status, stable for surgery.  I have reviewed the patient's chart and labs.  Questions were answered to the patient's satisfaction.     Shelva Majestic

## 2019-01-30 NOTE — Progress Notes (Signed)
Site area: rt groin fa and fv sheaths Site Prior to Removal:  Level 0 Pressure Applied For:  20 minutes Manual:   yes Patient Status During Pull:  stable Post Pull Site:  Level 0 Post Pull Instructions Given:  yes Post Pull Pulses Present: rt dp palpable Dressing Applied:  Gauze and tegaderm Bedrest begins @ 1750 Comments:

## 2019-01-30 NOTE — Progress Notes (Addendum)
Progress Note  Patient Name: Ryan [name] Date of Encounter: 01/30/2019  Primary Cardiologist: Mertie Moores, MD   Subjective   Pt feeling well today. Denies recurrent symptoms if at rest. Anticipating cath today.   Inpatient Medications    Scheduled Meds: . amiodarone  100 mg Oral Daily  . aspirin EC  81 mg Oral Daily  . bisoprolol  2.5 mg Oral Daily  . furosemide  40 mg Intravenous Q12H  . isosorbide mononitrate  60 mg Oral Daily  . pantoprazole  40 mg Oral Daily  . polyethylene glycol  34 g Oral Daily  . potassium chloride SA  40 mEq Oral Daily  . rosuvastatin  10 mg Oral q1800  . sodium chloride flush  3 mL Intravenous Q12H  . sodium chloride flush  3 mL Intravenous Q12H  . tamsulosin  0.4 mg Oral QHS   Continuous Infusions: . sodium chloride    . sodium chloride    . sodium chloride 1 mL/kg/hr (01/30/19 0501)  . heparin 1,150 Units/hr (01/29/19 2219)   PRN Meds: sodium chloride, sodium chloride, acetaminophen, meclizine, nitroGLYCERIN, ondansetron (ZOFRAN) IV, senna-docusate, sodium chloride flush, sodium chloride flush   Vital Signs    Vitals:   01/29/19 1953 01/30/19 0416 01/30/19 0510 01/30/19 0722  BP: 99/62  109/67 (!) 120/47  Pulse: 60  60 (!) 56  Resp: 17 18 15 17   Temp: 97.6 F (36.4 C)  97.9 F (36.6 C) 97.8 F (36.6 C)  TempSrc: Oral  Oral Oral  SpO2: 96%  97%   Weight:  73.5 kg    Height:        Intake/Output Summary (Last 24 hours) at 01/30/2019 0811 Last data filed at 01/29/2019 1700 Gross per 24 hour  Intake 480 ml  Output 300 ml  Net 180 ml   Filed Weights   01/28/19 0400 01/29/19 0453 01/30/19 0416  Weight: 74.3 kg 73.6 kg 73.5 kg   Physical Exam   General: Well developed, well nourished, NAD Skin: Warm, dry, intact  Head: Normocephalic, atraumatic, clear, moist mucus membranes. Neck: Negative for carotid bruits. No JVD Lungs:Clear to ausculation bilaterally. No wheezes, rales, or rhonchi. Breathing is  unlabored. Cardiovascular: RRR with S1 S2. No murmurs, rubs, gallops, or LV heave appreciated. Abdomen: Soft, non-tender, non-distended with normoactive bowel sounds.  No obvious abdominal masses. MSK: Strength and tone appear normal for age. 5/5 in all extremities Extremities: No edema. No clubbing or cyanosis. DP/PT pulses 2+ bilaterally Neuro: Alert and oriented. No focal deficits. No facial asymmetry. MAE spontaneously. Psych: Responds to questions appropriately with normal affect.    Labs    Chemistry Recent Labs  Lab 01/27/19 1519 01/28/19 0251  NA 141 140  K 4.7 4.0  CL 106 104  CO2 25 25  GLUCOSE 131* 91  BUN 16 14  CREATININE 1.30* 1.18  CALCIUM 9.2 8.8*  PROT 6.8  --   ALBUMIN 3.5  --   AST 24  --   ALT 14  --   ALKPHOS 47  --   BILITOT 1.3*  --   GFRNONAA 53* 59*  GFRAA >60 >60  ANIONGAP 10 11     Hematology Recent Labs  Lab 01/27/19 1519 01/29/19 0600 01/30/19 0331  WBC 7.5 7.9 7.2  RBC 4.26 4.39 4.10*  HGB 13.0 13.8 13.0  HCT 40.9 42.2 39.0  MCV 96.0 96.1 95.1  MCH 30.5 31.4 31.7  MCHC 31.8 32.7 33.3  RDW 14.1 13.8 14.0  PLT  237 276 257    Cardiac Enzymes Recent Labs  Lab 01/27/19 1519 01/27/19 2113 01/28/19 0251  TROPONINI 0.04* 0.05* 0.06*   No results for input(s): TROPIPOC in the last 168 hours.   BNP Recent Labs  Lab 01/27/19 1519  BNP 776.4*     DDimer No results for input(s): DDIMER in the last 168 hours.   Radiology    No results found.  Telemetry    01/30/2019 NSR with PACs - Personally Reviewed  ECG    No new tracing as of 01/30/2019 - Personally Reviewed  Cardiac Studies   2D echo 01/28/2019 IMPRESSIONS  1. The left ventricle has low normal systolic function of 16-10%. The cavity size was normal. There is no increased left ventricular wall thickness. Left ventricular diastology could not be evaluated due to indeterminent diastolic function. Elevated  left ventricular end-diastolic pressure The E/e' is  49. 2. The right ventricle has mildly reduced systolic function. The cavity was mildly enlarged. There is no increase in right ventricular wall thickness. Right ventricular systolic pressure could not be assessed. 3. The tricuspid valve is normal in structure. Tricuspid valve regurgitation is mild-moderate. 4. There is moderate calcification of the aortic valve without aortic stenosis. 5. The mitral valve is normal in structure. There is mild thickening and mild calcification.  Cardiac Cath 04/2018 Conclusion     Prox RCA to Mid RCA lesion is 100% stenosed.  Ost 1st Diag lesion is 99% stenosed.  1st Diag lesion is 90% stenosed.  Ost Ramus to Ramus lesion is 75% stenosed.  Ost Cx to Mid Cx lesion is 70% stenosed.  Mid Cx to Dist Cx lesion is 100% stenosed.  Prox LAD to Mid LAD lesion is 100% stenosed.  LIMA graft was visualized by angiography and is normal in caliber.  The graft exhibits no disease.  SVG.  Origin to Prox Graft lesion is 100% stenosed.  SVG.  Origin to Prox Graft lesion is 100% stenosed.  1. Severe 3 vessel obstructive CAD 2. Patent LIMA to the LAD. 3. By operative note there was a SVG Y graft to first and second diagonal branches- I suspect one of these branches was the ramus branch. The SVG is occluded. 4. Occluded SVG to PDA  Plan: recommend medical therapy. There are no good targets for PCI.    Patient Profile     78 y.o. male with a history of known CAD with multiple MIs in the past ultimately ending up with CABG in 2016. He also has a history of combined chronic systolic and diastolic CHF, carotid artery disease, hypertension, hyperlipidemia and paroxysmal atrial fibrillation she has been maintained on amiodarone and apixaban admitted with unstable angina.  Assessment & Plan    1.  Coronary artery disease involving native coronary artery of native heart with unstable angina pectoris [facility]): -Pt with a prior hx of CAD s/p MI and  subsequent CABG 2016 -Last cath 5/19 demonstrated that both vein grafts were occluded. The L-LAD was patent.There were no targets for PCI per cath report -Pt now admitted with resting chest pain and progressive dyspnea on exertion for several months, responsive to SL NTG  -EKG unchanged from prior tracings  -Trop minimally elevated but flat at 0.04<0.05<0.06 -Given progressive symptoms despite last cath with no PCI targets, plan is to proceed with R/L cath today for further evaluation   -Continue ASA 81, Imdur 60, bisoprolol 2.5 and statin   2. Acute on chronic combined systolic and diastolic CHF (congestive heart failure) (  [place]): -Echocardiogram in 04/2018 with LVEF of 40-45% with follow up echo 01/29/2019 with low normal LV function with EF 50 to 55% which is actually improved from echo a year ago. There was also  mild to moderate TR -BNP 776 with CXR showing increased pulmonary edema.  -Weight, 162.1lb today, 166.5lb on 01/27/2019 -I&O, net +920 mL since hospital admission -Continue IV Lasix 40 mg twice daily -Creatinine, 1.18 does not appear that BMET has been obtained since 01/28/2019>>>will get now and follow   3. PAF (paroxysmal atrial fibrillation) ([place]): -Maintaining NSR with occasional PACs per tele review  -Eliquis on hold in the setting of Hep gtt  -Continue bisoprolol for now and monitor closely  -Decreased amiodarone to 100 mg daily 01/28/2019 due to prolonged QTc is at 506 ms -Follow daily EKG for QTC>>>510 today   4.  Bilateral carotid artery disease, unspecified type ([place]):  -Continue follow up with [facility] -Continue on ASA and statin  5.  Essential hypertension: -Stable, 120/47>109/67>99/62 -Continue bisoprolol 2.5mg  daily  6.  History of pneumonia:  -Apparently resolved on CXR earlier this week  7.  Chronic Kidney Disease stage 2: -Creatinine stable at 1.18 on 01/28/2019>>will obtain BMET prior to cath  -Follow closely post procedure   8.   Hyperlipidemia: -Crestor restarted yesterday and will see how he does given prior complaints of scapular pain with previous therapy      Signed, Kathyrn Drown NP-C [facility] Pager: [phone] 01/30/2019, 8:11 AM    I have examined the patient and reviewed assessment and plan and discussed with patient.  Agree with above as stated.  He has significant anxiety as well.  Plan for cath later today.  Sx at home are imtermittent, some atypical and some typical sx of angina.  Larae Grooms  For questions or updates, please contact   Please consult [email or web address] for contact info under Cardiology/STEMI.

## 2019-01-30 NOTE — Progress Notes (Signed)
ANTICOAGULATION CONSULT NOTE - Follow-up Consult  Pharmacy Consult for Heparin  Indication: atrial fibrillation  Allergies  Allergen Reactions  . Codeine Nausea And Vomiting       . Zetia [Ezetimibe] Other (See Comments)    Myalgias   . Unithroid [Levothyroxine Sodium] Other (See Comments)    Caused blurry vision per pt    Patient Measurements: Height: 5\' 6"  (167.6 cm) Weight: 162 lb 1.6 oz (73.5 kg) IBW/kg (Calculated) : 63.8  Heparin Dosing Wt: 73.6   Vital Signs: Temp: 97.8 F (36.6 C) (02/10 0722) Temp Source: Oral (02/10 0722) BP: 120/47 (02/10 0722) Pulse Rate: 56 (02/10 0722)  Labs: Recent Labs    01/27/19 1519 01/27/19 2113 01/28/19 0251  01/28/19 2334 01/29/19 0600 01/29/19 1621 01/30/19 0331  HGB 13.0  --   --   --   --  13.8  --  13.0  HCT 40.9  --   --   --   --  42.2  --  39.0  PLT 237  --   --   --   --  276  --  257  APTT  --   --   --    < > 52* 76* 94* 77*  HEPARINUNFRC  --   --   --   --  1.40*  --   --  0.94*  CREATININE 1.30*  --  1.18  --   --   --   --   --   TROPONINI 0.04* 0.05* 0.06*  --   --   --   --   --    < > = values in this interval not displayed.    Estimated Creatinine Clearance: 47.3 mL/min (by C-G formula based on SCr of 1.18 mg/dL).  Assessment: 78 yo male with unstable angina with plans for cath today. He is on apixaban PTA for afib (last dose was 2/7 at ~ 9pm in the hospital). Pharmacy consulted to dose heparin for r/o ACS. Heparin level remains skewed by DOAC use, aPTT is at goal. Evening RN reported bleeding at IV site, per dayshift RN this has resolved. CBC stable this morning.  Goal of Therapy:  PTT 66-102 sec Heparin level 0.3-0.7 units/ml Monitor platelets by anticoagulation protocol: Yes   Plan:  -Continue heparin 1150 units/hr -Daily heparin level, aPTT, and CBC -Will follow-up plans for anticoagulation after cath today  Arrie Senate, PharmD, BCPS Clinical Pharmacist [phone] Please check AMION for  all [place] numbers 01/30/2019

## 2019-01-30 NOTE — Progress Notes (Signed)
Pt arrived back to 4e from [facility] cath lab. TR band with 13cc air to left wrist. Right femoral site with dry dressing. Dressing clean, dry, and intact. Site level 0. Vitals obtained and being monitored per protocol. Bedrest until 9:50pm. Pt aware. Pt denies needs at this time. Will continue current plan of care.  Ara Kussmaul BSN, RN

## 2019-01-30 NOTE — H&P (View-Only) (Signed)
Progress Note  Patient Name: Ryan [name] Date of Encounter: 01/30/2019  Primary Cardiologist: Mertie Moores, MD   Subjective   Pt feeling well today. Denies recurrent symptoms if at rest. Anticipating cath today.   Inpatient Medications    Scheduled Meds: . amiodarone  100 mg Oral Daily  . aspirin EC  81 mg Oral Daily  . bisoprolol  2.5 mg Oral Daily  . furosemide  40 mg Intravenous Q12H  . isosorbide mononitrate  60 mg Oral Daily  . pantoprazole  40 mg Oral Daily  . polyethylene glycol  34 g Oral Daily  . potassium chloride SA  40 mEq Oral Daily  . rosuvastatin  10 mg Oral q1800  . sodium chloride flush  3 mL Intravenous Q12H  . sodium chloride flush  3 mL Intravenous Q12H  . tamsulosin  0.4 mg Oral QHS   Continuous Infusions: . sodium chloride    . sodium chloride    . sodium chloride 1 mL/kg/hr (01/30/19 0501)  . heparin 1,150 Units/hr (01/29/19 2219)   PRN Meds: sodium chloride, sodium chloride, acetaminophen, meclizine, nitroGLYCERIN, ondansetron (ZOFRAN) IV, senna-docusate, sodium chloride flush, sodium chloride flush   Vital Signs    Vitals:   01/29/19 1953 01/30/19 0416 01/30/19 0510 01/30/19 0722  BP: 99/62  109/67 (!) 120/47  Pulse: 60  60 (!) 56  Resp: 17 18 15 17   Temp: 97.6 F (36.4 C)  97.9 F (36.6 C) 97.8 F (36.6 C)  TempSrc: Oral  Oral Oral  SpO2: 96%  97%   Weight:  73.5 kg    Height:        Intake/Output Summary (Last 24 hours) at 01/30/2019 0811 Last data filed at 01/29/2019 1700 Gross per 24 hour  Intake 480 ml  Output 300 ml  Net 180 ml   Filed Weights   01/28/19 0400 01/29/19 0453 01/30/19 0416  Weight: 74.3 kg 73.6 kg 73.5 kg   Physical Exam   General: Well developed, well nourished, NAD Skin: Warm, dry, intact  Head: Normocephalic, atraumatic, clear, moist mucus membranes. Neck: Negative for carotid bruits. No JVD Lungs:Clear to ausculation bilaterally. No wheezes, rales, or rhonchi. Breathing is  unlabored. Cardiovascular: RRR with S1 S2. No murmurs, rubs, gallops, or LV heave appreciated. Abdomen: Soft, non-tender, non-distended with normoactive bowel sounds.  No obvious abdominal masses. MSK: Strength and tone appear normal for age. 5/5 in all extremities Extremities: No edema. No clubbing or cyanosis. DP/PT pulses 2+ bilaterally Neuro: Alert and oriented. No focal deficits. No facial asymmetry. MAE spontaneously. Psych: Responds to questions appropriately with normal affect.    Labs    Chemistry Recent Labs  Lab 01/27/19 1519 01/28/19 0251  NA 141 140  K 4.7 4.0  CL 106 104  CO2 25 25  GLUCOSE 131* 91  BUN 16 14  CREATININE 1.30* 1.18  CALCIUM 9.2 8.8*  PROT 6.8  --   ALBUMIN 3.5  --   AST 24  --   ALT 14  --   ALKPHOS 47  --   BILITOT 1.3*  --   GFRNONAA 53* 59*  GFRAA >60 >60  ANIONGAP 10 11     Hematology Recent Labs  Lab 01/27/19 1519 01/29/19 0600 01/30/19 0331  WBC 7.5 7.9 7.2  RBC 4.26 4.39 4.10*  HGB 13.0 13.8 13.0  HCT 40.9 42.2 39.0  MCV 96.0 96.1 95.1  MCH 30.5 31.4 31.7  MCHC 31.8 32.7 33.3  RDW 14.1 13.8 14.0  PLT  237 276 257    Cardiac Enzymes Recent Labs  Lab 01/27/19 1519 01/27/19 2113 01/28/19 0251  TROPONINI 0.04* 0.05* 0.06*   No results for input(s): TROPIPOC in the last 168 hours.   BNP Recent Labs  Lab 01/27/19 1519  BNP 776.4*     DDimer No results for input(s): DDIMER in the last 168 hours.   Radiology    No results found.  Telemetry    01/30/2019 NSR with PACs - Personally Reviewed  ECG    No new tracing as of 01/30/2019 - Personally Reviewed  Cardiac Studies   2D echo 01/28/2019 IMPRESSIONS  1. The left ventricle has low normal systolic function of 13-24%. The cavity size was normal. There is no increased left ventricular wall thickness. Left ventricular diastology could not be evaluated due to indeterminent diastolic function. Elevated  left ventricular end-diastolic pressure The E/e' is  18. 2. The right ventricle has mildly reduced systolic function. The cavity was mildly enlarged. There is no increase in right ventricular wall thickness. Right ventricular systolic pressure could not be assessed. 3. The tricuspid valve is normal in structure. Tricuspid valve regurgitation is mild-moderate. 4. There is moderate calcification of the aortic valve without aortic stenosis. 5. The mitral valve is normal in structure. There is mild thickening and mild calcification.  Cardiac Cath 04/2018 Conclusion     Prox RCA to Mid RCA lesion is 100% stenosed.  Ost 1st Diag lesion is 99% stenosed.  1st Diag lesion is 90% stenosed.  Ost Ramus to Ramus lesion is 75% stenosed.  Ost Cx to Mid Cx lesion is 70% stenosed.  Mid Cx to Dist Cx lesion is 100% stenosed.  Prox LAD to Mid LAD lesion is 100% stenosed.  LIMA graft was visualized by angiography and is normal in caliber.  The graft exhibits no disease.  SVG.  Origin to Prox Graft lesion is 100% stenosed.  SVG.  Origin to Prox Graft lesion is 100% stenosed.  1. Severe 3 vessel obstructive CAD 2. Patent LIMA to the LAD. 3. By operative note there was a SVG Y graft to first and second diagonal branches- I suspect one of these branches was the ramus branch. The SVG is occluded. 4. Occluded SVG to PDA  Plan: recommend medical therapy. There are no good targets for PCI.    Patient Profile     78 y.o. male with a history of known CAD with multiple MIs in the past ultimately ending up with CABG in 2016. He also has a history of combined chronic systolic and diastolic CHF, carotid artery disease, hypertension, hyperlipidemia and paroxysmal atrial fibrillation she has been maintained on amiodarone and apixaban admitted with unstable angina.  Assessment & Plan    1.  Coronary artery disease involving native coronary artery of native heart with unstable angina pectoris [facility]): -Pt with a prior hx of CAD s/p MI and  subsequent CABG 2016 -Last cath 5/19 demonstrated that both vein grafts were occluded. The L-LAD was patent.There were no targets for PCI per cath report -Pt now admitted with resting chest pain and progressive dyspnea on exertion for several months, responsive to SL NTG  -EKG unchanged from prior tracings  -Trop minimally elevated but flat at 0.04<0.05<0.06 -Given progressive symptoms despite last cath with no PCI targets, plan is to proceed with R/L cath today for further evaluation   -Continue ASA 81, Imdur 60, bisoprolol 2.5 and statin   2. Acute on chronic combined systolic and diastolic CHF (congestive heart failure) (  [place]): -Echocardiogram in 04/2018 with LVEF of 40-45% with follow up echo 01/29/2019 with low normal LV function with EF 50 to 55% which is actually improved from echo a year ago. There was also  mild to moderate TR -BNP 776 with CXR showing increased pulmonary edema.  -Weight, 162.1lb today, 166.5lb on 01/27/2019 -I&O, net +920 mL since hospital admission -Continue IV Lasix 40 mg twice daily -Creatinine, 1.18 does not appear that BMET has been obtained since 01/28/2019>>>will get now and follow   3. PAF (paroxysmal atrial fibrillation) ([place]): -Maintaining NSR with occasional PACs per tele review  -Eliquis on hold in the setting of Hep gtt  -Continue bisoprolol for now and monitor closely  -Decreased amiodarone to 100 mg daily 01/28/2019 due to prolonged QTc is at 506 ms -Follow daily EKG for QTC>>>510 today   4.  Bilateral carotid artery disease, unspecified type ([place]):  -Continue follow up with [facility] -Continue on ASA and statin  5.  Essential hypertension: -Stable, 120/47>109/67>99/62 -Continue bisoprolol 2.5mg  daily  6.  History of pneumonia:  -Apparently resolved on CXR earlier this week  7.  Chronic Kidney Disease stage 2: -Creatinine stable at 1.18 on 01/28/2019>>will obtain BMET prior to cath  -Follow closely post procedure   8.   Hyperlipidemia: -Crestor restarted yesterday and will see how he does given prior complaints of scapular pain with previous therapy      Signed, Kathyrn Drown NP-C [facility] Pager: [phone] 01/30/2019, 8:11 AM    I have examined the patient and reviewed assessment and plan and discussed with patient.  Agree with above as stated.  He has significant anxiety as well.  Plan for cath later today.  Sx at home are imtermittent, some atypical and some typical sx of angina.  Larae Grooms  For questions or updates, please contact   Please consult [email or web address] for contact info under Cardiology/STEMI.

## 2019-01-31 ENCOUNTER — Telehealth: Payer: Self-pay | Admitting: Cardiovascular Disease

## 2019-01-31 ENCOUNTER — Encounter (HOSPITAL_COMMUNITY): Payer: Self-pay | Admitting: Cardiovascular Disease

## 2019-01-31 DIAGNOSIS — I25118 Atherosclerotic heart disease of native coronary artery with other forms of angina pectoris: Secondary | ICD-10-CM

## 2019-01-31 LAB — BASIC METABOLIC PANEL
Anion gap: 9 (ref 5–15)
BUN: 23 mg/dL (ref 8–23)
CO2: 26 mmol/L (ref 22–32)
Calcium: 8.7 mg/dL — ABNORMAL LOW (ref 8.9–10.3)
Chloride: 107 mmol/L (ref 98–111)
Creatinine, Ser: 1.37 mg/dL — ABNORMAL HIGH (ref 0.61–1.24)
GFR calc Af Amer: 57 mL/min — ABNORMAL LOW (ref >60)
GFR calc non Af Amer: 49 mL/min — ABNORMAL LOW (ref >60)
GLUCOSE: 122 mg/dL — AB (ref 70–99)
Potassium: 4.9 mmol/L (ref 3.5–5.1)
Sodium: 142 mmol/L (ref 135–145)

## 2019-01-31 LAB — CBC
HCT: 38.6 % — ABNORMAL LOW (ref 39.0–52.0)
Hemoglobin: 12.3 g/dL — ABNORMAL LOW (ref 13.0–17.0)
MCH: 30.6 pg (ref 26.0–34.0)
MCHC: 31.9 g/dL (ref 30.0–36.0)
MCV: 96 fL (ref 80.0–100.0)
NRBC: 0 % (ref 0.0–0.2)
Platelets: 251 10*3/uL (ref 150–400)
RBC: 4.02 MIL/uL — AB (ref 4.22–5.81)
RDW: 13.8 % (ref 11.5–15.5)
WBC: 7.8 10*3/uL (ref 4.0–10.5)

## 2019-01-31 LAB — APTT: aPTT: 27 seconds (ref 24–36)

## 2019-01-31 MED ORDER — AMLODIPINE BESYLATE 5 MG PO TABS: 5.0000 mg | ORAL_TABLET | Freq: Every day | ORAL | 4 refills | Status: DC

## 2019-01-31 MED ORDER — AMIODARONE HCL 100 MG PO TABS: 100.0000 mg | ORAL_TABLET | Freq: Every day | ORAL | 4 refills | Status: DC

## 2019-01-31 MED ORDER — BISOPROLOL FUMARATE 5 MG PO TABS: 2.5000 mg | ORAL_TABLET | Freq: Every day | ORAL | 4 refills | Status: DC

## 2019-01-31 MED ORDER — ASPIRIN 81 MG PO TBEC: 81.0000 mg | DELAYED_RELEASE_TABLET | Freq: Every day | ORAL | Status: DC

## 2019-01-31 MED ORDER — ISOSORBIDE MONONITRATE ER 60 MG PO TB24: 60.0000 mg | ORAL_TABLET | Freq: Every day | ORAL | 4 refills | Status: DC

## 2019-01-31 NOTE — Telephone Encounter (Signed)
The pt is being discharged from the hospital today. We will call tomorrow. 

## 2019-01-31 NOTE — Discharge Summary (Addendum)
Discharge Summary    Patient ID: Ryan [name] MRN: [id number]; DOB: [birth year]  Admit date: 01/27/2019 Discharge date: 01/31/2019  Primary Care Provider: Marton Redwood, MD  Primary Cardiologist: Mertie Moores, MD   Discharge Diagnoses    Principal Problem:   Coronary artery disease involving native coronary artery with unstable angina pectoris [facility]) Active Problems:   HTN (hypertension)   HLD (hyperlipidemia)   Chronic combined systolic and diastolic CHF (congestive heart failure) (HCC)   PAF (paroxysmal atrial fibrillation) (HCC)   Dizziness   History of pneumonia   CKD (chronic kidney disease) stage 3, GFR 30-59 ml/min (HCC)  Allergies Allergies  Allergen Reactions  . Codeine Nausea And Vomiting       . Zetia [Ezetimibe] Other (See Comments)    Myalgias   . Unithroid [Levothyroxine Sodium] Other (See Comments)    Caused blurry vision per pt   Diagnostic Studies/Procedures    Cardiac catheterization 01/30/2019:   Ost 1st Diag lesion is 80% stenosed.  Prox RCA to Mid RCA lesion is 100% stenosed.  Ost Ramus to Ramus lesion is 90% stenosed.  LIMA and is normal in caliber.  The graft exhibits no disease.  SVG.  Origin to Prox Graft lesion is 100% stenosed.  SVG.  Origin to Prox Graft lesion is 100% stenosed.  1st Diag lesion is 95% stenosed.  Ost LAD to Prox LAD lesion is 50% stenosed.  Ost Cx to Prox Cx lesion is 65% stenosed.  Mid Cx to Dist Cx lesion is 85% stenosed.  Prox LAD lesion is 70% stenosed.  Prox LAD to Mid LAD lesion is 50% stenosed.   Severe native coronary artery disease with diffuse proximal to mid LAD stenosis with diffusely diseased native LAD proximally and mid with competitive filling from the LIMA graft; 90% proximal stenosis in the ramus immediate vessel, diffuse stenosis of 60 to 70% in the proximal circumflex with diffuse 80% distal stenosis and total occlusion of the AV groove after marginal vessel with total occlusion of  the proximal native RCA.  There is extensive collateralization to the distal RCA both via the circumflex vessel as well as via the LIMA to LAD and distal LAD.  Patent LIMA graft supplying the mid LAD  Previously documented old occlusion of the previous Y graft which had supplied the ramus and marginal vessel in the vein graft which had supplied the PDA.  Low right heart pressures with mean PA pressure at 19 mmHg.  RECOMMENDATION: Increase medical therapy.  The patient has only been on low-dose anti-ischemic medical regimen consisting of bisoprolol 2.5 mg as well as isosorbide 60 mg.  Recommend initiation of amlodipine 5 mg.  Also recommend consideration of Ranexa 500 mg twice a day.  The patient is on rosuvastatin.  However if the patient is to be on atorvastatin, the maximum recommended dose of atorvastatin is 40 mg if the patient is on ranolazine.  Echocardiogram 01/28/2019: IMPRESSIONS  1. The left ventricle has low normal systolic function of 42-68%. The cavity size was normal. There is no increased left ventricular wall thickness. Left ventricular diastology could not be evaluated due to indeterminent diastolic function. Elevated  left ventricular end-diastolic pressure The E/e' is 33.  2. The right ventricle has mildly reduced systolic function. The cavity was mildly enlarged. There is no increase in right ventricular wall thickness. Right ventricular systolic pressure could not be assessed.  3. The tricuspid valve is normal in structure. Tricuspid valve regurgitation is mild-moderate.  4. There is moderate calcification  of the aortic valve without aortic stenosis.  5. The mitral valve is normal in structure. There is mild thickening and mild calcification.  History of Present Illness     Ryan [name] is a 78 y.o. male with coronary artery disease status post prior myocardial infarctions and subsequent CABG in 0932, combined systolic and diastolic heart failure, carotid  artery disease, hypertension, hyperlipidemia, hypothyroidism, paroxysmal atrial fibrillation.  He underwent left CEA with Dr. Kellie Simmering in May 2017.  He was diagnosed with atrial fibrillation in March 2018 during an admission and was placed on Apixaban and amiodarone.  He converted to normal sinus rhythm.  He was seen in the hospital again in February 2019 with chest pain and mildly abnormal troponin levels.  Stress Myoview was abnormal but he declined cardiac catheterization at that time.  He ultimately underwent cardiac catheterization in May 2019 due to worsening anginal symptoms.  This demonstrated three-vessel CAD.  LIMA-LAD was patent.  However, SVG-RI and SVG-RPDA were both occluded.  There were no good targets for PCI and medical therapy was recommended.  Echocardiogram in February 2019 demonstrated EF 40-45% with grade 2 diastolic dysfunction.  He was last seen by Dr. Acie Fredrickson in August 2019.   Mr. Kampe returned for the evaluation of chest pain and shortness of breath on 01/27/2019.  He was dx with pneumonia several weeks prior to admission and was treated with antibiotics.  He had a follow up with his PCP and the notes indicate his CXR is now clear.  However, the patient has noted shortness of breath with minimal exertion that seems to have gotten worse over the past 2-3 weeks.  He also noted substernal chest pain with exertion at times.  He has had chest pain at rest at well.  He had recurrent chest pain at rest with assoc shortness of breath this AM that resolved with SL NTG.  He has a long hx of nausea but this is not assoc with his chest pain.    Hospital Course     Patient with increasing symptoms of exertional shortness of breath now with minimal exertion as well as both exertional and rest chest pain. Symptoms were consistent with prior angina.  EKG shows no acute ST changes consistent with ACS.  Unfortunately his last cardiac cath a year ago did not show any good targets for PCI but he seemed  to have calmed down with really no significant chest pain since that cath until now over the past several weeks.  His symptoms are consistent with unstable angina.  He does have LV dysfunction but does not appear volume overloaded.  I have recommended proceeding with hospital admission on telemetry unit for rule out MI.  He is currently on Eliquis for his A. fib which we will hold starting tomorrow and cover with IV heparin.  He will continue on his beta-blocker.  We will increase nitrates to 60 mg daily.  Continue aspirin and repeat echo to reassess LV function.  He is maintaining normal sinus rhythm on EKG today.  Plan repeat cardiac cath on Monday, 01/30/2019.   Cath showed severe native coronary artery disease with diffuse proximal to mid LAD stenosis with diffusely diseased native LAD proximally and mid with competitive filling from the LIMA graft; 90% proximal stenosis in the ramus immediate vessel, diffuse stenosis of 60 to 70% in the proximal circumflex with diffuse 80% distal stenosis and total occlusion of the AV groove after marginal vessel with total occlusion of the proximal native RCA.  There is extensive collateralization to the distal RCA both via the circumflex vessel as well as via the LIMA to LAD and distal LAD. Patent LIMA graft supplying the mid LAD. There was previously documented old occlusion of the previous Y graft which had supplied the ramus and marginal vessel in the vein graft which had supplied the PDA. No intervention was completed.   Recommendations were for increased medical therapy.  He was noted to be on low-dose anti-ischemic medical regimen consisting of bisoprolol 2.5 mg as well as isosorbide 60 mg.  Recommended initiation of amlodipine 5 mg.  Also recommended consideration of Ranexa 500 mg twice a day at follow up if further chest pain symptoms. The patient is on rosuvastatin. However if the patient is to be on atorvastatin, the maximum recommended dose of atorvastatin is  40 mg if the patient is on ranolazine.   Hospital problems include: -Coronary artery disease involving native coronary artery of native heart with unstable angina pectoris [facility]): -Pt with a prior hx of CAD s/p MI and subsequent CABG 2016 -Last cath 5/19 demonstrated that both vein grafts were occluded. The L-LAD was patent.There were no targets for PCI per cath report -Pt now admitted with resting chest pain and progressive dyspnea on exertion for several months, responsive to SL NTG  -EKG unchanged from prior tracings  -Trop minimally elevated but flat at 0.04<0.05<0.06 -Given progressive symptoms despite last cath with no PCI targets, plan was to proceed with R/L cath 01/30/2019 for further evaluation   -Continue ASA 81, Imdur 60, bisoprolol 2.5 and statin   -Acute on chronic combined systolic and diastolic CHF (congestive heart failure) ([place]): -Echocardiogram in 04/2018 with LVEF of 40-45% with follow up echo 01/29/2019 with low normal LV function with EF 50 to 55% which is actually improved from echo a year ago.There was also mild to moderate TR -BNP 776 with CXR showing increased pulmonary edema.  -Weight, 162.1lb today, 166.5lb on 01/27/2019 -I&O, net +2LmL since hospital admission -Creatinine, 1.18>>1.55>>1.37  -IV Lasix stopped and will need BMET at follow up -Baseline creatinine appears to be in the 1.4-1.6 range -Resumed home dose PO lasix at d/c   -PAF (paroxysmal atrial fibrillation) ([place]): -Maintaining NSR with occasional PACs per tele review  -Will resume Eliquis today  -Continue bisoprolol for now and monitor closely  -Decreasedamiodarone to 100 mg daily 02/08/2020due to prolonged QTc is at 506 ms -Follow daily EKG for QTC>>>510 on 01/30/2019>>repeat in the OP setting   -Bilateral carotid artery disease, unspecified type ([place]):  -Continue follow up with [facility] -Continue on ASA and statin  -Essential hypertension: -Stable, 102/52>105/51>103/61 -Continue  bisoprolol 2.5mg  daily, amlodipine added to regimen given c/o chest pain with no area for PCI   -History of pneumonia:  -Apparently resolved on CXR earlier this week  -Chronic Kidney Disease stage 2: -Creatinine increased during hospitalization from 1.18>1.55>1.37 on day of discharge. -Follow closely in the OP setting   -Hyperlipidemia: -Crestor restarted 01/29/2019 and will see how he does given prior complaints of scapular pain with previous therapy -Follow in OP setting        Consultants: None   The patient was seen and examined by Dr. Irish Lack who feels that he is stable and ready for discharge today, 01/31/2019.   Medication changes include the addition of amlodipine. Can add Ranexa in the OP setting if needed. See Dr. Claiborne Billings cath note above.   General: Well developed, well nourished, NAD Skin: Warm, dry, intact  Head: Normocephalic, atraumatic, clear, moist  mucus membranes. Neck: Negative for carotid bruits. No JVD Lungs:Clear to ausculation bilaterally. No wheezes, rales, or rhonchi. Breathing is unlabored. Cardiovascular: RRR with S1 S2. No murmurs, rubs, gallops, or LV heave appreciated. Abdomen: Soft, non-tender, non-distended with normoactive bowel sounds. No obvious abdominal masses. MSK: Strength and tone appear normal for age. 5/5 in all extremities Extremities: No edema. No clubbing or cyanosis. DP/PT pulses 2+ bilaterally Neuro: Alert and oriented. No focal deficits. No facial asymmetry. MAE spontaneously. Psych: Responds to questions appropriately with normal affect.    Discharge Vitals Blood pressure (!) 102/52, pulse 62, temperature 97.6 F (36.4 C), temperature source Oral, resp. rate 18, height 5\' 6"  (1.676 m), weight 73.9 kg, SpO2 95 %.  Filed Weights   01/29/19 0453 01/30/19 0416 01/31/19 0512  Weight: 73.6 kg 73.5 kg 73.9 kg   Labs & Radiologic Studies    CBC Recent Labs    01/30/19 0331  01/30/19 1546 01/31/19 0331  WBC 7.2  --   --  7.8    HGB 13.0   < > 12.9* 12.3*  HCT 39.0   < > 38.0* 38.6*  MCV 95.1  --   --  96.0  PLT 257  --   --  251   < > = values in this interval not displayed.   Basic Metabolic Panel Recent Labs    01/30/19 0835  01/30/19 1546 01/31/19 0331  NA 138   < > 136 142  K 4.1   < > 4.1 4.9  CL 102  --   --  107  CO2 25  --   --  26  GLUCOSE 116*  --   --  122*  BUN 21  --   --  23  CREATININE 1.55*  --   --  1.37*  CALCIUM 9.1  --   --  8.7*   < > = values in this interval not displayed.   Dg Chest 2 View  Result Date: 01/27/2019 CLINICAL DATA:  78 year old male with substernal chest pain, nonproductive cough and shortness of breath. Recently on antibiotics for pneumonia. EXAM: CHEST - 2 VIEW COMPARISON:  04/24/2018 and earlier. FINDINGS: Stable mild cardiomegaly and mediastinal contours. Stable prior CABG. Stable lung volumes. Visualized tracheal air column is within normal limits. There is subtle diffuse increased interstitial opacity today, particularly when compared to 02/07/2018. No pleural effusion. No pneumothorax or confluent pulmonary opacity. Chronic postoperative changes at the left shoulder stable along with osteopenia. Stable visualized osseous structures. Chronic surgical clips in the upper abdomen. Negative visible bowel gas pattern. IMPRESSION: Stable aside from subtle increased pulmonary interstitial markings since February 2019. Top differential considerations include viral/atypical respiratory infection, developing interstitial edema, and progressed chronic lung disease. Electronically Signed   By: [name] M.D.   On: 01/27/2019 18:24   Disposition   Pt is being discharged home today in good condition.  Follow-up Plans & Appointments    Follow-up Information    [place], Crista Luria, [place] Follow up on 02/13/2019.   Specialty:  Cardiology Why:  Your follow up appointment will be on 02/13/2019 at 0900.  Contact information: [place]  [address] [phone]          Discharge Instructions    Call MD for:  difficulty breathing, headache or visual disturbances   Complete by:  As directed    Call MD for:  extreme fatigue   Complete by:  As directed    Call MD for:  hives   Complete by:  As directed    Call MD for:  persistant dizziness or light-headedness   Complete by:  As directed    Call MD for:  persistant nausea and vomiting   Complete by:  As directed    Call MD for:  redness, tenderness, or signs of infection (pain, swelling, redness, odor or green/yellow discharge around incision site)   Complete by:  As directed    Call MD for:  severe uncontrolled pain   Complete by:  As directed    Call MD for:  temperature >100.4   Complete by:  As directed    Diet - low sodium heart healthy   Complete by:  As directed    Discharge instructions   Complete by:  As directed    No driving for 3 days. No lifting over 5 lbs for 1 week. No sexual activity for 1 week. Keep procedure site clean & dry. If you notice increased pain, swelling, bleeding or pus, call/return!  You may shower, but no soaking baths/hot tubs/pools for 1 week.   Increase activity slowly   Complete by:  As directed      Discharge Medications   Allergies as of 01/31/2019      Reactions   Codeine Nausea And Vomiting       Zetia [ezetimibe] Other (See Comments)   Myalgias    Unithroid [levothyroxine Sodium] Other (See Comments)   Caused blurry vision per pt      Medication List    STOP taking these medications   HYDROcodone-acetaminophen 5-325 MG tablet Commonly known as:  NORCO/VICODIN     TAKE these medications   amiodarone 100 MG tablet Commonly known as:  PACERONE Take 1 tablet (100 mg total) by mouth daily. Start taking on:  February 01, 2019 What changed:    medication strength  how much to take   amLODipine 5 MG tablet Commonly known as:  NORVASC Take 1 tablet (5 mg total) by mouth daily.   apixaban 5 MG Tabs  tablet Commonly known as:  ELIQUIS Take 1 tablet (5 mg total) by mouth 2 (two) times daily.   aspirin 81 MG EC tablet Take 1 tablet (81 mg total) by mouth daily. Start taking on:  February 01, 2019   bisoprolol 5 MG tablet Commonly known as:  ZEBETA Take 0.5 tablets (2.5 mg total) by mouth daily. Start taking on:  February 01, 2019   furosemide 20 MG tablet Commonly known as:  LASIX Take 1 tablet (20 mg total) by mouth daily.   isosorbide mononitrate 60 MG 24 hr tablet Commonly known as:  IMDUR Take 1 tablet (60 mg total) by mouth daily. Start taking on:  February 01, 2019 What changed:    medication strength  how much to take   lactose free nutrition Liqd Take 237 mLs by mouth daily.   meclizine 25 MG tablet Commonly known as:  ANTIVERT Take 25 mg by mouth daily.   nitroGLYCERIN 0.4 MG SL tablet Commonly known as:  NITROSTAT Place 1 tablet (0.4 mg total) under the tongue every 5 (five) minutes as needed for chest pain (CP or SOB). What changed:  reasons to take this   pantoprazole 40 MG tablet Commonly known as:  PROTONIX Take 40 mg by mouth daily.   potassium chloride SA 20 MEQ tablet Commonly known as:  K-DUR,KLOR-CON TAKE TWO TABLETS BY MOUTH DAILY What changed:  how much to take   rosuvastatin 10 MG tablet Commonly known  as:  CRESTOR Take 10 mg by mouth every Monday, Wednesday, and Friday.   senna 8.6 MG Tabs tablet Commonly known as:  SENOKOT Take 1 tablet (8.6 mg total) by mouth daily.   tamsulosin 0.4 MG Caps capsule Commonly known as:  FLOMAX Take 0.4 mg by mouth at bedtime.        Acute coronary syndrome (MI, NSTEMI, STEMI, etc) this admission?:  No.  The elevated Troponin was due to the acute medical illness or demand ischemia.    Outstanding Labs/Studies   BMET, EKG   Duration of Discharge Encounter   Greater than 30 minutes including physician time.  Signed, Kathyrn Drown, NP 01/31/2019, 12:16 PM  I have examined the patient  and reviewed assessment and plan and discussed with patient.  Agree with above as stated.    Medical therapy for CAD.  Amlodipine added.  Sx are better.  If he has more sx, would consider adding Ranexa.   GEN: Well nourished, well developed, in no acute distress  HEENT: normal  Neck: no JVD, carotid bruits, or masses Cardiac: RRR; no murmurs, rubs, or gallops,no edema  Respiratory:  clear to auscultation bilaterally, normal work of breathing GI: soft, nontender, nondistended,  MS: no deformity or atrophy ; left radial pulse 2+, no hematoma Skin: warm and dry, no rash Neuro:  Strength and sensation are intact Psych: euthymic mood, full affect  F/u with Dr. Emogene Morgan

## 2019-01-31 NOTE — Telephone Encounter (Signed)
  TOC appt per Kathyrn Drown 02/13/19 @ 9am with Bhagat

## 2019-01-31 NOTE — Care Management Important Message (Signed)
Important Message  Patient Details  Name: Ryan [name] MRN: [id number] Date of Birth: 11-12-1941   Medicare Important Message Given:  Yes    Deannah Rossi P Winford Hehn 01/31/2019, 4:42 PM

## 2019-01-31 NOTE — Progress Notes (Signed)
  Progress Note   Date: 01/31/2019  Patient Name: Ryan [name]        MRN#: [id number]  Clarification of diagnosis:   CKD stage 3 with GFR 30-26ml./min   Kathyrn Drown, NP

## 2019-01-31 NOTE — Discharge Instructions (Signed)

## 2019-02-01 ENCOUNTER — Ambulatory Visit: Payer: Medicare Other | Admitting: Nurse Practitioner

## 2019-02-01 ENCOUNTER — Other Ambulatory Visit: Payer: Self-pay | Admitting: Cardiovascular Disease

## 2019-02-01 NOTE — Telephone Encounter (Signed)
Left the pt a message to call the office back and request to speak with a triage nurse, for TCM follow-up.

## 2019-02-01 NOTE — Telephone Encounter (Signed)
I called and s/w Pharm-D at [facility] in regards to pt stating pharmacy needed authorization for Amlodipine. Pharmacist states no they are needing Prior Auth on Amiodarone, she states they sent over a fax already but she will re-send. I advised that Prior Auth nurse will work on the Authorization for the Amiodarone. Pharmacist thanked me for the call. I will call the pt back and let them know that the Prior Auth is for the Amiodarone and we will work on that for them.   S/w pt's wife (DPR) and advised Amldoipine Rx is ready and the authorization is needed for the Amiodarone and that a fax has already been sent to our office for the Prior Auth nurse to work on. Pt's wife thanked me for the call.

## 2019-02-01 NOTE — Consult Note (Signed)
            [facility] CM Primary Care Navigator  02/01/2019  ARION SHANKLES 27-May-1941 [id number]   Wenttoseepatient at the bedsideto identify possible discharge needs buthe was alreadydischargedhome per staff.   Per MD note, patient was admitted for evaluation of chest pain, worsening shortness of breath even after treatment of pneumonia. (status post cardiac cath, coronary artery disease with unstable angina pectoris, acute on chronic combined systolic and diastolic congestive heart failure, paroxysmal atrial fibrillation)  Patient hasdischarge instruction to follow-up withcardiology on 02/13/19.   Primary care provider's officeis listed asprovidingtransition of care (TOC).  Primary care provider's office called Claiborne Billings) to notifyofdischarge, need for post hospital follow-up and transition of care (TOC). Notified ofpatient's health issues needing close follow-up.   Made aware to refer patient to [facility] care management if deemed necessary and appropriate for services.    For additional questions please contact:  Edwena Felty A. Lester Platas, BSN, RN-BC [facility] PRIMARY CARE Navigator Cell: [phone]

## 2019-02-01 NOTE — Telephone Encounter (Signed)
I called and s/w pt's wife (DPR) Rx Amlodipine was sent in yesterday. She states the pharmacy said they need an authorization to get this filled from hospital. I explained that pharmacy is most likely just needing an authorization from cardiology as pt was just d/c from hospital. I advised her that the pharmacy opens at 9 am and I will call and confirm they have Rx . I advised that I will back if there are any problems. Pt's wife thanked me for the call.

## 2019-02-01 NOTE — Telephone Encounter (Signed)
°*  STAT* If patient is at the pharmacy, call can be transferred to refill team.   1. Which medications need to be refilled? (please list name of each medication and dose if known) Amlodipine 5mg   2. Which pharmacy/location (including street and city if local pharmacy) is medication to be sent to? [address]  3. Do they need a 30 day or 90 day supply? Patient do not know. They need authorization to get this fill from hospital. Patient came home yesterday from hospital.

## 2019-02-02 NOTE — Telephone Encounter (Signed)
Patient contacted regarding discharge from [facility] on 01/31/2019.  Patient understands to follow up with provider Vin on 02/13/19 at 9:00 am at [facility]. Patient understands discharge instructions? yes Patient understands medications and regiment? yes Patient understands to bring all medications to this visit? yes

## 2019-02-03 ENCOUNTER — Telehealth: Payer: Self-pay

## 2019-02-03 ENCOUNTER — Other Ambulatory Visit: Payer: Self-pay

## 2019-02-03 MED ORDER — AMIODARONE HCL 200 MG PO TABS: 100.0000 mg | ORAL_TABLET | Freq: Every day | ORAL | 3 refills | Status: DC

## 2019-02-03 NOTE — Telephone Encounter (Signed)
I did an Amiodarone PA through covermymeds. Key: [id number]  I also called [facility] as Amiodarone is listed as the preferred medication for the pts DX and was advised that the message they received from the pts insurance when they ran this RX was: 200 mg is the preferred dose.  I was advised to send the pts Amiodarone to them as 200 mg take 1/2 tablet daily which I have done. Per the pharmacy the pt just picked up a refill and it maybe to soon for them to run it again. We may have to wait until next month to find out unless the PA I did through covermymeds is approved before then.

## 2019-02-06 ENCOUNTER — Ambulatory Visit (HOSPITAL_COMMUNITY)
Admission: RE | Admit: 2019-02-06 | Discharge: 2019-02-06 | Disposition: A | Discharge status: Home / Self Care | Payer: Medicare Other | Source: Ambulatory Visit | Attending: Internal Medicine | Admitting: Internal Medicine

## 2019-02-06 DIAGNOSIS — R0600 Dyspnea, unspecified: Secondary | ICD-10-CM

## 2019-02-06 DIAGNOSIS — R0609 Other forms of dyspnea: Secondary | ICD-10-CM | POA: Insufficient documentation

## 2019-02-06 LAB — PULMONARY FUNCTION TEST
DL/VA % pred: 70 %
DL/VA: 2.84 ml/min/mmHg/L
DLCO COR: 12.38 ml/min/mmHg
DLCO cor % pred: 58 %
DLCO unc % pred: 54 %
DLCO unc: 11.5 ml/min/mmHg
FEF 25-75 Post: 3.43 L/sec
FEF 25-75 Pre: 1.86 L/sec
FEF2575-%Change-Post: 84 %
FEF2575-%Pred-Post: 204 %
FEF2575-%Pred-Pre: 111 %
FEV1-%Change-Post: 14 %
FEV1-%Pred-Post: 98 %
FEV1-%Pred-Pre: 86 %
FEV1-Post: 2.34 L
FEV1-Pre: 2.04 L
FEV1FVC-%Change-Post: 10 %
FEV1FVC-%Pred-Pre: 107 %
FEV6-%Change-Post: 4 %
FEV6-%Pred-Post: 87 %
FEV6-%Pred-Pre: 84 %
FEV6-Post: 2.72 L
FEV6-Pre: 2.6 L
FEV6FVC-%CHANGE-POST: 0 %
FEV6FVC-%Pred-Post: 107 %
FEV6FVC-%Pred-Pre: 106 %
FVC-%Change-Post: 3 %
FVC-%Pred-Post: 81 %
FVC-%Pred-Pre: 78 %
FVC-Post: 2.72 L
FVC-Pre: 2.63 L
PRE FEV1/FVC RATIO: 78 %
Post FEV1/FVC ratio: 86 %
Post FEV6/FVC ratio: 100 %
Pre FEV6/FVC Ratio: 99 %
RV % pred: 87 %
RV: 2.04 L
TLC % pred: 81 %
TLC: 4.9 L

## 2019-02-06 MED ORDER — ALBUTEROL SULFATE (2.5 MG/3ML) 0.083% IN NEBU
2.5000 mg | INHALATION_SOLUTION | Freq: Once | RESPIRATORY_TRACT | Status: AC
  Administered 2019-02-06: 2.5 mg via RESPIRATORY_TRACT

## 2019-02-06 MED ORDER — AMIODARONE HCL 200 MG PO TABS: 200.0000 mg | ORAL_TABLET | Freq: Every day | ORAL | 3 refills | Status: DC

## 2019-02-06 MED ORDER — AMIODARONE HCL 100 MG PO TABS: 100.0000 mg | ORAL_TABLET | Freq: Every day | ORAL | 3 refills | Status: DC

## 2019-02-06 NOTE — Telephone Encounter (Signed)
**Note De-Identified Ryan [name] Obfuscation** Letter received from [facility]urocher fax stating that they have approved the pts Amiodarone 100 mg PA. Approval good until 12/21/2019.  I have notified the pts pharmacy of this approval and changed the pts Amiodarone back to 100 mg daily.

## 2019-02-08 DIAGNOSIS — N183 Chronic kidney disease, stage 3 (moderate): Secondary | ICD-10-CM | POA: Diagnosis not present

## 2019-02-08 DIAGNOSIS — Z23 Encounter for immunization: Secondary | ICD-10-CM | POA: Diagnosis not present

## 2019-02-08 DIAGNOSIS — I6529 Occlusion and stenosis of unspecified carotid artery: Secondary | ICD-10-CM | POA: Diagnosis not present

## 2019-02-08 DIAGNOSIS — I480 Paroxysmal atrial fibrillation: Secondary | ICD-10-CM | POA: Diagnosis not present

## 2019-02-08 DIAGNOSIS — I10 Essential (primary) hypertension: Secondary | ICD-10-CM | POA: Diagnosis not present

## 2019-02-08 DIAGNOSIS — Z0000 Encounter for general adult medical examination without abnormal findings: Secondary | ICD-10-CM | POA: Diagnosis not present

## 2019-02-08 DIAGNOSIS — R0609 Other forms of dyspnea: Secondary | ICD-10-CM | POA: Diagnosis not present

## 2019-02-08 DIAGNOSIS — Z1331 Encounter for screening for depression: Secondary | ICD-10-CM | POA: Diagnosis not present

## 2019-02-08 DIAGNOSIS — Z7901 Long term (current) use of anticoagulants: Secondary | ICD-10-CM | POA: Diagnosis not present

## 2019-02-08 DIAGNOSIS — R7301 Impaired fasting glucose: Secondary | ICD-10-CM | POA: Diagnosis not present

## 2019-02-08 DIAGNOSIS — I25810 Atherosclerosis of coronary artery bypass graft(s) without angina pectoris: Secondary | ICD-10-CM | POA: Diagnosis not present

## 2019-02-08 DIAGNOSIS — E7849 Other hyperlipidemia: Secondary | ICD-10-CM | POA: Diagnosis not present

## 2019-02-08 DIAGNOSIS — E038 Other specified hypothyroidism: Secondary | ICD-10-CM | POA: Diagnosis not present

## 2019-02-08 DIAGNOSIS — I5020 Unspecified systolic (congestive) heart failure: Secondary | ICD-10-CM | POA: Diagnosis not present

## 2019-02-10 DIAGNOSIS — Z1212 Encounter for screening for malignant neoplasm of rectum: Secondary | ICD-10-CM | POA: Diagnosis not present

## 2019-02-13 ENCOUNTER — Other Ambulatory Visit: Payer: Self-pay

## 2019-02-13 ENCOUNTER — Encounter: Payer: Self-pay | Admitting: Physician Assistant

## 2019-02-13 ENCOUNTER — Ambulatory Visit (INDEPENDENT_AMBULATORY_CARE_PROVIDER_SITE_OTHER): Payer: Medicare Other | Admitting: Physician Assistant

## 2019-02-13 VITALS — BP 126/60 | HR 63 | Ht 66.0 in | Wt 169.8 lb

## 2019-02-13 DIAGNOSIS — I739 Peripheral vascular disease, unspecified: Secondary | ICD-10-CM | POA: Diagnosis not present

## 2019-02-13 DIAGNOSIS — I10 Essential (primary) hypertension: Secondary | ICD-10-CM | POA: Diagnosis not present

## 2019-02-13 DIAGNOSIS — I480 Paroxysmal atrial fibrillation: Secondary | ICD-10-CM | POA: Diagnosis not present

## 2019-02-13 DIAGNOSIS — I25110 Atherosclerotic heart disease of native coronary artery with unstable angina pectoris: Secondary | ICD-10-CM | POA: Diagnosis not present

## 2019-02-13 DIAGNOSIS — I5042 Chronic combined systolic (congestive) and diastolic (congestive) heart failure: Secondary | ICD-10-CM

## 2019-02-13 DIAGNOSIS — E785 Hyperlipidemia, unspecified: Secondary | ICD-10-CM

## 2019-02-13 DIAGNOSIS — I779 Disorder of arteries and arterioles, unspecified: Secondary | ICD-10-CM

## 2019-02-13 NOTE — Progress Notes (Signed)
Cardiology Office Note    Date:  02/13/2019   ID:  [name], [name] October 19, 1941, MRN [id number]  PCP:  Marton Redwood, MD  Cardiologist:  Dr. Acie Fredrickson  Chief Complaint: Hospital follow up   History of Present Illness:   Ryan [name] is a 78 y.o. male with coronary artery disease status post prior myocardial infarctions and subsequent CABG in 9379, combined systolic and diastolic heart failure, carotid artery disease s/p L CEA (followed by Dr. Kellie Simmering), hypertension, hyperlipidemia, hypothyroidism, paroxysmal atrial fibrillation presents for hospital follow up.   He underwent cardiac catheterization in May 2019 due to worsening anginal symptoms. This demonstrated three-vessel CAD. LIMA-LAD was patent. However, SVG-RI and SVG-RPDA were both occluded. There were no good targets for PCI and medical therapy was recommended. Echocardiogram in February 2019 demonstrated EF 40-45% with grade 2 diastolic dysfunction.   Admitted 01/2019 for unstable angina. Cath showed severe native coronary artery disease with diffuse proximal to mid LAD stenosis with diffusely diseased native LAD proximally and mid with competitive filling from the LIMA graft; 90% proximal stenosis in the ramus immediate vessel, diffuse stenosis of 60 to 70% in the proximal circumflex with diffuse 80% distal stenosis and total occlusion of the AV groove after marginal vessel with total occlusion of the proximal native RCA. There is extensive collateralization to the distal RCA both via the circumflex vessel as well as via the LIMA to LAD and distal LAD. Patent LIMA graft supplying the mid LAD. There was previously documented old occlusion of the previous Y graft which had supplied the ramus and marginal vessel in the vein graft which had supplied the PDA. No intervention was completed. Echo 01/29/2019 withlow normal LV function with EF 50 to 55% which is actually improved from echo a year ago.There was also mild to moderate  TR. Reduced amiodarone to 100mg  daily.  His antianginal are bisoprolol, Imdur and amlodipine (new). Plan to add Renexa for recurrent symptoms.   Here today for follow up.  No recurrent symptoms of chest pain or shortness of breath.  Energy level is gradually improving.  Walking some without reproducible symptoms.  No orthopnea, PND, syncope, dizziness, lower extremity edema or melena.  Recently diagnosed with hypothyroidism after hospitalization and now on supplement medication.   Past Medical History:  Diagnosis Date  . Arthritis   . CAD (coronary artery disease)   . Carotid artery occlusion   . Cataract    Bil eyes/worse in left eye  . CHF (congestive heart failure) ([place])   . Chronic back pain   . DVT (deep venous thrombosis) ([place])   . Dysrhythmia   . Enlarged prostate    takes Rapaflo daily  . GERD (gastroesophageal reflux disease)    occasional  . History of colon polyps   . History of gout    has colchicine prn  . History of kidney stones   . Hyperlipidemia    takes Crestor daily  . Hypertension    takes Amlodipine daily  . Hypothyroidism   . Myocardial infarction ([place])   . Peripheral vascular disease ([place])   . Pulmonary emboli ([place]) 03/20/2015   elevated d-dimer, intermediate V/Q study, atypical chest pain and SOB. Start on Xarelto 20mg  BID for 3 month  . Rapid atrial fibrillation ([place])   . Renal insufficiency   . Shortness of breath dyspnea   . Urinary frequency   . Urinary urgency     Past Surgical History:  Procedure Laterality Date  . APPENDECTOMY    .  BACK SURGERY     5 times  . big toe surgery    . CARDIAC CATHETERIZATION     2010    dr Acie Fredrickson  . cataract surgery     left eye  . CHOLECYSTECTOMY N/A 07/27/2016   Procedure: LAPAROSCOPIC CHOLECYSTECTOMY;  Surgeon: Mickeal Skinner, MD;  Location: [facility];  Service: General;  Laterality: N/A;  . COLONOSCOPY    . CORONARY ARTERY BYPASS GRAFT N/A 04/05/2015   Procedure: CORONARY ARTERY BYPASS GRAFTING  (CABG)X4 LIMA-LAD; SVG-DIAG1-DIAG2; SVG-PD;  Surgeon: [name], MD;  Location: [facility];  Service: Open Heart Surgery;  Laterality: N/A;  . CORONARY/GRAFT ANGIOGRAPHY N/A 04/22/2018   Procedure: CORONARY/GRAFT ANGIOGRAPHY;  Surgeon: Martinique, Peter M, MD;  Location: [facility] CV LAB;  Service: Cardiovascular;  Laterality: N/A;  . CYSTOSCOPY    . ENDARTERECTOMY Left 04/24/2016   Procedure: ENDARTERECTOMY LEFT CAROTID;  Surgeon: Mal Misty, MD;  Location: [facility];  Service: Vascular;  Laterality: Left;  . EYE SURGERY    . FEMORAL ARTERY - POPLITEAL ARTERY BYPASS GRAFT    . JOINT REPLACEMENT     shoulder  . LEFT HEART CATHETERIZATION WITH CORONARY ANGIOGRAM N/A 04/03/2015   Procedure: LEFT HEART CATHETERIZATION WITH CORONARY ANGIOGRAM;  Surgeon: Troy Sine, MD;  Location: [facility] CATH LAB;  Service: Cardiovascular;  Laterality: N/A;  . LUMBAR LAMINECTOMY  01/06/2013   Procedure: MICRODISCECTOMY LUMBAR LAMINECTOMY;  Surgeon: Marybelle Killings, MD;  Location: [facility];  Service: Orthopedics;  Laterality: N/A;  L3-4 decompression  . LUMBAR LAMINECTOMY/DECOMPRESSION MICRODISCECTOMY  02/12/2012   Procedure: LUMBAR LAMINECTOMY/DECOMPRESSION MICRODISCECTOMY;  Surgeon: Floyce Stakes, MD;  Location: [facility];  Service: Neurosurgery;  Laterality: N/A;  Lumbar four-five laminectomy  . PATCH ANGIOPLASTY Left 04/24/2016   Procedure: LEFT CAROTID ARTERY PATCH ANGIOPLASTY;  Surgeon: Mal Misty, MD;  Location: [facility];  Service: Vascular;  Laterality: Left;  . RIGHT HEART CATH AND CORONARY/GRAFT ANGIOGRAPHY N/A 01/30/2019   Procedure: RIGHT HEART CATH AND CORONARY/GRAFT ANGIOGRAPHY;  Surgeon: Troy Sine, MD;  Location: [facility] CV LAB;  Service: Cardiovascular;  Laterality: N/A;  . STERIOD INJECTION Right 01/09/2014   Procedure: STEROID INJECTION;  Surgeon: Mcarthur Rossetti, MD;  Location: [facility];  Service: Orthopedics;  Laterality: Right;  . TEE WITHOUT CARDIOVERSION N/A 04/05/2015   Procedure:  TRANSESOPHAGEAL ECHOCARDIOGRAM (TEE);  Surgeon: [name], MD;  Location: [facility];  Service: Open Heart Surgery;  Laterality: N/A;  . TOTAL HIP ARTHROPLASTY Left 01/09/2014   DR Ninfa Linden  . TOTAL HIP ARTHROPLASTY Left 01/09/2014   Procedure: LEFT TOTAL HIP ARTHROPLASTY ANTERIOR APPROACH and Steroid Injection Right hip;  Surgeon: Mcarthur Rossetti, MD;  Location: [facility];  Service: Orthopedics;  Laterality: Left;    Current Medications:  Prior to Admission medications   Medication Sig Start Date End Date Taking? Authorizing Provider  amiodarone (PACERONE) 100 MG tablet Take 1 tablet (100 mg total) by mouth daily. 02/06/19  Yes [name], [name], MD  amLODipine (NORVASC) 5 MG tablet Take 1 tablet (5 mg total) by mouth daily. 01/31/19  Yes Kathyrn Drown D, NP  apixaban (ELIQUIS) 5 MG TABS tablet Take 1 tablet (5 mg total) by mouth 2 (two) times daily. 04/23/18  Yes Martinique, Peter M, MD  aspirin EC 81 MG EC tablet Take 1 tablet (81 mg total) by mouth daily. 02/01/19  Yes Kathyrn Drown D, NP  bisoprolol (ZEBETA) 5 MG tablet Take 0.5 tablets (2.5 mg total) by mouth daily. 02/01/19  Yes Kathyrn Drown D, NP  furosemide (LASIX) 20 MG tablet Take 1 tablet (20 mg total) by mouth daily. 08/04/18  Yes [name], [name], MD  isosorbide mononitrate (IMDUR) 60 MG 24 hr tablet Take 1 tablet (60 mg total) by mouth daily. 02/01/19  Yes Kathyrn Drown D, NP  lactose free nutrition (BOOST) LIQD Take 237 mLs by mouth daily.   Yes [provider]  levothyroxine (SYNTHROID, LEVOTHROID) 50 MCG tablet Take 50 mcg by mouth daily. 02/08/19  Yes [provider]  meclizine (ANTIVERT) 25 MG tablet Take 25 mg by mouth daily.  01/06/18  Yes [provider]  nitroGLYCERIN (NITROSTAT) 0.4 MG SL tablet Place 1 tablet (0.4 mg total) under the tongue every 5 (five) minutes as needed for chest pain (CP or SOB). 02/09/18  Yes Hall, Carole N, DO  pantoprazole (PROTONIX) 40 MG tablet Take 40 mg by mouth  daily.   Yes [provider]  potassium chloride SA (K-DUR,KLOR-CON) 20 MEQ tablet TAKE TWO TABLETS BY MOUTH DAILY 10/07/18  Yes [name], [name], MD  rosuvastatin (CRESTOR) 10 MG tablet Take 10 mg by mouth every Monday, Wednesday, and Friday.    Yes [provider]  senna (SENOKOT) 8.6 MG TABS tablet Take 1 tablet (8.6 mg total) by mouth daily. 02/09/18  Yes Kayleen Memos, DO  tamsulosin (FLOMAX) 0.4 MG CAPS capsule Take 0.4 mg by mouth at bedtime. 03/01/17  Yes [provider]   Allergies:   Codeine; Zetia [ezetimibe]; and Unithroid [levothyroxine sodium]   Social History   Socioeconomic History  . Marital status: Married    Spouse name: Not on file  . Number of children: Not on file  . Years of education: Not on file  . Highest education level: Not on file  Occupational History  . Not on file  Social Needs  . Financial resource strain: Not on file  . Food insecurity:    Worry: Not on file    Inability: Not on file  . Transportation needs:    Medical: Not on file    Non-medical: Not on file  Tobacco Use  . Smoking status: Former Smoker    Types: Cigarettes    Last attempt to quit: 02/04/1987    Years since quitting: 32.0  . Smokeless tobacco: Former Systems developer    Types: Chew    Quit date: 07/20/2009  . Tobacco comment: quit 35+yrs ago  Substance and Sexual Activity  . Alcohol use: No    Alcohol/week: 0.0 standard drinks  . Drug use: No  . Sexual activity: Not Currently  Lifestyle  . Physical activity:    Days per week: Not on file    Minutes per session: Not on file  . Stress: Not on file  Relationships  . Social connections:    Talks on phone: Not on file    Gets together: Not on file    Attends religious service: Not on file    Active member of club or organization: Not on file    Attends meetings of clubs or organizations: Not on file    Relationship status: Not on file  Other Topics Concern  . Not on file  Social History Narrative  . Not  on file     Family History:  The patient's family history includes Diabetes in his son; Heart attack in his father and sister; Heart disease in his father and sister; Hypertension in his mother and sister.   ROS:   Please see the history  of present illness.    ROS All other systems reviewed and are negative.   PHYSICAL EXAM:   VS:  BP 126/60   Pulse 63   Ht 5\' 6"  (1.676 m)   Wt 169 lb 12.8 oz (77 kg)   SpO2 98%   BMI 27.41 kg/m    GEN: Well nourished, well developed, in no acute distress  HEENT: normal  Neck: no JVD, carotid bruits, or masses Cardiac: RRR; no murmurs, rubs, or gallops,no edema  Respiratory:  clear to auscultation bilaterally, normal work of breathing GI: soft, nontender, nondistended, + BS MS: no deformity or atrophy  Skin: warm and dry, no rash Neuro:  Alert and Oriented x 3, Strength and sensation are intact Psych: euthymic mood, full affect  Wt Readings from Last 3 Encounters:  02/13/19 169 lb 12.8 oz (77 kg)  01/31/19 162 lb 14.7 oz (73.9 kg)  01/27/19 170 lb 12.8 oz (77.5 kg)      Studies/Labs Reviewed:   EKG:  EKG is not  ordered today.    Recent Labs: 01/27/2019: ALT 14; B Natriuretic Peptide 776.4 01/28/2019: TSH 39.642 01/31/2019: BUN 23; Creatinine, Ser 1.37; Hemoglobin 12.3; Platelets 251; Potassium 4.9; Sodium 142   Lipid Panel    Component Value Date/Time   CHOL 236 (H) 04/20/2018 0932   TRIG 137 04/20/2018 0932   HDL 43 04/20/2018 0932   CHOLHDL 5.5 (H) 04/20/2018 0932   CHOLHDL 3.8 02/08/2018 1003   VLDL 22 02/08/2018 1003   LDLCALC 166 (H) 04/20/2018 0932    Additional studies/ records that were reviewed today include:   Cardiac catheterization 01/30/2019:   Ost 1st Diag lesion is 80% stenosed.  Prox RCA to Mid RCA lesion is 100% stenosed.  Ost Ramus to Ramus lesion is 90% stenosed.  LIMA and is normal in caliber.  The graft exhibits no disease.  SVG.  Origin to Prox Graft lesion is 100% stenosed.  SVG.  Origin  to Prox Graft lesion is 100% stenosed.  1st Diag lesion is 95% stenosed.  Ost LAD to Prox LAD lesion is 50% stenosed.  Ost Cx to Prox Cx lesion is 65% stenosed.  Mid Cx to Dist Cx lesion is 85% stenosed.  Prox LAD lesion is 70% stenosed.  Prox LAD to Mid LAD lesion is 50% stenosed.  Severe native coronary artery disease with diffuse proximal to mid LAD stenosis with diffusely diseased native LAD proximally and mid with competitive filling from the LIMA graft; 90% proximal stenosis in the ramus immediate vessel, diffuse stenosis of 60 to 70% in the proximal circumflex with diffuse 80% distal stenosis and total occlusion of the AV groove after marginal vessel with total occlusion of the proximal native RCA. There is extensive collateralization to the distal RCA both via the circumflex vessel as well as via the LIMA to LAD and distal LAD.  Patent LIMA graft supplying the mid LAD  Previously documented old occlusion of the previous Y graft which had supplied the ramus and marginal vessel in the vein graft which had supplied the PDA.  Low right heart pressures with mean PA pressure at 19 mmHg.  RECOMMENDATION: Increase medical therapy. The patient has only been on low-dose anti-ischemic medical regimen consisting of bisoprolol 2.5 mg as well as isosorbide 60 mg. Recommend initiation of amlodipine 5 mg. Also recommend consideration of Ranexa 500 mg twice a day. The patient is on rosuvastatin. However if the patient is to be on atorvastatin, the maximum recommended dose of atorvastatin is 40  mg if the patient is on ranolazine.  Echocardiogram 01/28/2019: IMPRESSIONS  1. The left ventricle has low normal systolic function of 96-75%. The cavity size was normal. There is no increased left ventricular wall thickness. Left ventricular diastology could not be evaluated due to indeterminent diastolic function. Elevated  left ventricular end-diastolic pressure The E/e' is 23. 2. The  right ventricle has mildly reduced systolic function. The cavity was mildly enlarged. There is no increase in right ventricular wall thickness. Right ventricular systolic pressure could not be assessed. 3. The tricuspid valve is normal in structure. Tricuspid valve regurgitation is mild-moderate. 4. There is moderate calcification of the aortic valve without aortic stenosis. 5. The mitral valve is normal in structure. There is mild thickening and mild calcification.   ASSESSMENT & PLAN:    1. CAD Cath as above.  No recurrent angina.  Continue medical therapy with aspirin, bisoprolol, Norvasc, Imdur and Crestor.  2.  Chronic combined CHF -Echocardiogram 01/28/2019 showed improved LV function to 50 to 55%.  Continue Lasix and beta-blocker.  He is euvolemic.  3.  Paroxysmal atrial fibrillation -No bleeding issue.  Continue Eliquis, amiodarone 100 mg daily and bisoprolol 2.5 mg daily.  4.  Hypertension -Blood pressure stable on current medication.  5.  Carotid artery disease -Followed by vascular  6. HLD - 04/20/2018: Cholesterol, Total 236; HDL 43; LDL Calculated 166; Triglycerides 137 - Can not afford PCSK9 inhibitor - On Crestor 10mg  MWF  Medication Adjustments/Labs and Tests Ordered: Current medicines are reviewed at length with the patient today.  Concerns regarding medicines are outlined above.  Medication changes, Labs and Tests ordered today are listed in the Patient Instructions below. Patient Instructions  Medication Instructions:  Your physician recommends that you continue on your current medications as directed. Please refer to the Current Medication list given to you today.  If you need a refill on your cardiac medications before your next appointment, please call your pharmacy.   Lab work: None ordered  If you have labs (blood work) drawn today and your tests are completely normal, you will receive your results only by: Marland Kitchen MyChart Message (if you have MyChart)  OR . A paper copy in the mail If you have any lab test that is abnormal or we need to change your treatment, we will call you to review the results.  Testing/Procedures: None ordered  Follow-Up: At [facility], you and your health needs are our priority.  As part of our continuing mission to provide you with exceptional heart care, we have created designated Provider Care Teams.  These Care Teams include your primary Cardiologist (physician) and Advanced Practice Providers (APPs -  Physician Assistants and Nurse Practitioners) who all work together to provide you with the care you need, when you need it. You will need a follow up appointment in:  3-4 months.  You may see Mertie Moores, MD or one of the following Advanced Practice Providers on your designated Care Team: [name], PA-C [place], [place] . Daune Perch, NP  Any Other Special Instructions Will Be Listed Below (If Applicable).       Mahalia Longest [place], [place]  02/13/2019 9:08 AM    [address], [place], [place]  [address] Phone: [phone]; Fax: [phone]

## 2019-02-13 NOTE — Patient Instructions (Addendum)
Medication Instructions:  Your physician recommends that you continue on your current medications as directed. Please refer to the Current Medication list given to you today.  If you need a refill on your cardiac medications before your next appointment, please call your pharmacy.   Lab work: None ordered  If you have labs (blood work) drawn today and your tests are completely normal, you will receive your results only by: [place] Kitchen MyChart Message (if you have MyChart) OR . A paper copy in the mail If you have any lab test that is abnormal or we need to change your treatment, we will call you to review the results.  Testing/Procedures: None ordered  Follow-Up: At [facility], you and your health needs are our priority.  As part of our continuing mission to provide you with exceptional heart care, we have created designated Provider Care Teams.  These Care Teams include your primary Cardiologist (physician) and Advanced Practice Providers (APPs -  Physician Assistants and Nurse Practitioners) who all work together to provide you with the care you need, when you need it. You will need a follow up appointment in:  3-4 months.  You may see Mertie Moores, MD or one of the following Advanced Practice Providers on your designated Care Team: [name], PA-C [place], [place] . Daune Perch, NP  Any Other Special Instructions Will Be Listed Below (If Applicable).

## 2019-02-13 NOTE — Patient Outreach (Addendum)
[facility]) Care Management  02/13/2019  Ryan [name] 26-May-1941 [id number]  screening  Referral date: 02/13/19 Referral source:   Primary MD referral. Insurance: medicare  Telephone call to patient regarding primary MD referral.  Patients wife, Nassir Neidert answered call. Wife listed as patients designated party release. Explained reason for call. Wife requested RNCM speak with patient. Requested RNCM call patient on his cell number. Patients cell number listed in chart and confirmed with wife.  RNCM contacted patient.  HIPAA verified. Explained reason for call.  Patient request a call back in a few weeks. Patient states he has to have a colonoscopy due to some medical findings and would not be interested in the program at this time. RNCM informed patient she would send patient [facility] care management brochure with contact phone number.   RNCM advised patient to contact [facility] care management for future needs. Patient verbalized  Agreement and understanding.   PLAN:  RNCm will close patient due to refusal of services.  RNCM will send patient [facility] care management brochure/  [place] RNCm will send patients primary MD closure letter  Quinn Plowman RN,BSN,CCM [facility] Telephonic  [phone] '

## 2019-02-22 ENCOUNTER — Ambulatory Visit: Payer: Medicare Other | Admitting: Physician Assistant

## 2019-02-24 ENCOUNTER — Telehealth: Payer: Self-pay | Admitting: Cardiovascular Disease

## 2019-02-24 MED ORDER — NITROGLYCERIN 0.4 MG SL SUBL: 0.4000 mg | SUBLINGUAL_TABLET | SUBLINGUAL | 6 refills | Status: DC | PRN

## 2019-02-24 NOTE — Telephone Encounter (Signed)
Medication refilled to desired pharmacy.

## 2019-02-24 NOTE — Telephone Encounter (Signed)
New Message    *STAT* If patient is at the pharmacy, call can be transferred to refill team.   1. Which medications need to be refilled? (please list name of each medication and dose if known) nitroGLYCERIN (NITROSTAT) 0.4 MG SL tablet  2. Which pharmacy/location (including street and city if local pharmacy) is medication to be sent to?[facility] [address], [place]  3. Do they need a 30 day or 90 day supply? [place]

## 2019-03-08 ENCOUNTER — Ambulatory Visit: Payer: Medicare Other | Admitting: Physician Assistant

## 2019-04-04 ENCOUNTER — Other Ambulatory Visit: Payer: Self-pay | Admitting: Cardiovascular Disease

## 2019-04-04 DIAGNOSIS — I25110 Atherosclerotic heart disease of native coronary artery with unstable angina pectoris: Secondary | ICD-10-CM

## 2019-04-06 ENCOUNTER — Telehealth: Payer: Self-pay | Admitting: *Deleted

## 2019-04-06 NOTE — Telephone Encounter (Signed)
Patient should be on isosorbide mononitrate (Imdur) 60 mg daily. He was given #90 pills with 4 refills on 01/31/19 so he should have plenty of medication. Maybe this is a request for an old prescription or for Imdur 30 mg. I cannot see the refill that has been requested.

## 2019-04-06 NOTE — Telephone Encounter (Signed)
Ryan [name] is requesting a refill for Imdur, but has a note stating that it was stopped by Hale Bogus, NP on 01/31/19. pls advise. Thank you

## 2019-05-10 ENCOUNTER — Telehealth: Payer: Self-pay

## 2019-05-10 NOTE — Telephone Encounter (Signed)
Spoke with pt and he wanted to reschedule his appt because he would be out of town due to the holiday weekend. Pt does have capability to take BP, HR, and weight and will have it ready for visit.   YOUR CARDIOLOGY TEAM HAS ARRANGED FOR AN E-VISIT FOR YOUR APPOINTMENT - PLEASE REVIEW IMPORTANT INFORMATION BELOW SEVERAL DAYS PRIOR TO YOUR APPOINTMENT  Due to the recent COVID-19 pandemic, we are transitioning in-person office visits to tele-medicine visits in an effort to decrease unnecessary exposure to our patients, their families, and staff. These visits are billed to your insurance just like a normal visit is. We also encourage you to sign up for MyChart if you have not already done so. You will need a smartphone if possible. For patients that do not have this, we can still complete the visit using a regular telephone but do prefer a smartphone to enable video when possible. You may have a family member that lives with you that can help. If possible, we also ask that you have a blood pressure cuff and scale at home to measure your blood pressure, heart rate and weight prior to your scheduled appointment. Patients with clinical needs that need an in-person evaluation and testing will still be able to come to the office if absolutely necessary. If you have any questions, feel free to call our office.     Marland Kitchen YOUR PROVIDER WILL BE USING THE FOLLOWING PLATFORM TO COMPLETE YOUR VISIT: [email or web address] . IF USING MYCHART - How to Download the MyChart App to Your SmartPhone   - If Apple, go to [employer] and type in MyChart in the search bar and download the app. If Android, ask patient to go to [employer] and type in [place] in the search bar and download the app. The app is free but as with any other app downloads, your phone may require you to verify saved payment information or Apple/Android password.  - You will need to then log into the app with your MyChart username and password, and select [facility] as  your healthcare provider to link the account.  - When it is time for your visit, go to the MyChart app, find appointments, and click Begin Video Visit. Be sure to Select Allow for your device to access the Microphone and Camera for your visit. You will then be connected, and your provider will be with you shortly.  **If you have any issues connecting or need assistance, please contact MyChart service desk [phone])**  **If using a computer, in order to ensure the best quality for your visit, you will need to use either of the following Internet Browsers: Insurance underwriter or [employer]**  . IF USING DOXIMITY or [email or web address] - The staff will give you instructions on receiving your link to join the meeting the day of your visit.      2-3 DAYS BEFORE YOUR APPOINTMENT  You will receive a telephone call from one of our [place] team members - your caller ID may say "Unknown caller." If this is a video visit, we will walk you through how to get the video launched on your phone. We will remind you check your blood pressure, heart rate and weight prior to your scheduled appointment. If you have an Apple Watch or Kardia, please upload any pertinent ECG strips the day before or morning of your appointment to [place]. Our staff will also make sure you have reviewed the consent and agree to move forward with your  scheduled tele-health visit.     THE DAY OF YOUR APPOINTMENT  Approximately 15 minutes prior to your scheduled appointment, you will receive a telephone call from one of [facility] team - your caller ID may say "Unknown caller."  Our staff will confirm medications, vital signs for the day and any symptoms you may be experiencing. Please have this information available prior to the time of visit start. It may also be helpful for you to have a pad of paper and pen handy for any instructions given during your visit. They will also walk you through joining the smartphone meeting if this is a  video visit.    CONSENT FOR TELE-HEALTH VISIT - PLEASE REVIEW  I hereby voluntarily request, consent and authorize [facility] and its employed or contracted physicians, physician assistants, nurse practitioners or other licensed health care professionals (the Practitioner), to provide me with telemedicine health care services (the "Services") as deemed necessary by the treating Practitioner. I acknowledge and consent to receive the Services by the Practitioner via telemedicine. I understand that the telemedicine visit will involve communicating with the Practitioner through live audiovisual communication technology and the disclosure of certain medical information by electronic transmission. I acknowledge that I have been given the opportunity to request an in-person assessment or other available alternative prior to the telemedicine visit and am voluntarily participating in the telemedicine visit.  I understand that I have the right to withhold or withdraw my consent to the use of telemedicine in the course of my care at any time, without affecting my right to future care or treatment, and that the Practitioner or I may terminate the telemedicine visit at any time. I understand that I have the right to inspect all information obtained and/or recorded in the course of the telemedicine visit and may receive copies of available information for a reasonable fee.  I understand that some of the potential risks of receiving the Services via telemedicine include:  Marland Kitchen Delay or interruption in medical evaluation due to technological equipment failure or disruption; . Information transmitted may not be sufficient (e.g. poor resolution of images) to allow for appropriate medical decision making by the Practitioner; and/or  . In rare instances, security protocols could fail, causing a breach of personal health information.  Furthermore, I acknowledge that it is my responsibility to provide information about my  medical history, conditions and care that is complete and accurate to the best of my ability. I acknowledge that Practitioner's advice, recommendations, and/or decision may be based on factors not within their control, such as incomplete or inaccurate data provided by me or distortions of diagnostic images or specimens that may result from electronic transmissions. I understand that the practice of medicine is not an exact science and that Practitioner makes no warranties or guarantees regarding treatment outcomes. I acknowledge that I will receive a copy of this consent concurrently upon execution via email to the email address I last provided but may also request a printed copy by calling the office of [facility].    I understand that my insurance will be billed for this visit.   I have read or had this consent read to me. . I understand the contents of this consent, which adequately explains the benefits and risks of the Services being provided via telemedicine.  . I have been provided ample opportunity to ask questions regarding this consent and the Services and have had my questions answered to my satisfaction. . I give my informed consent for  the services to be provided through the use of telemedicine in my medical care  By participating in this telemedicine visit I agree to the above.

## 2019-05-16 ENCOUNTER — Ambulatory Visit: Payer: Medicare Other | Admitting: Cardiovascular Disease

## 2019-06-19 DIAGNOSIS — I5020 Unspecified systolic (congestive) heart failure: Secondary | ICD-10-CM | POA: Diagnosis not present

## 2019-06-19 DIAGNOSIS — I130 Hypertensive heart and chronic kidney disease with heart failure and stage 1 through stage 4 chronic kidney disease, or unspecified chronic kidney disease: Secondary | ICD-10-CM | POA: Diagnosis not present

## 2019-06-19 DIAGNOSIS — Z7901 Long term (current) use of anticoagulants: Secondary | ICD-10-CM | POA: Diagnosis not present

## 2019-06-19 DIAGNOSIS — E785 Hyperlipidemia, unspecified: Secondary | ICD-10-CM | POA: Diagnosis not present

## 2019-06-19 DIAGNOSIS — R7301 Impaired fasting glucose: Secondary | ICD-10-CM | POA: Diagnosis not present

## 2019-06-19 DIAGNOSIS — E039 Hypothyroidism, unspecified: Secondary | ICD-10-CM | POA: Diagnosis not present

## 2019-06-19 DIAGNOSIS — N183 Chronic kidney disease, stage 3 (moderate): Secondary | ICD-10-CM | POA: Diagnosis not present

## 2019-06-19 DIAGNOSIS — R972 Elevated prostate specific antigen [PSA]: Secondary | ICD-10-CM | POA: Diagnosis not present

## 2019-06-19 DIAGNOSIS — I480 Paroxysmal atrial fibrillation: Secondary | ICD-10-CM | POA: Diagnosis not present

## 2019-06-19 DIAGNOSIS — M5416 Radiculopathy, lumbar region: Secondary | ICD-10-CM | POA: Diagnosis not present

## 2019-06-19 DIAGNOSIS — I25810 Atherosclerosis of coronary artery bypass graft(s) without angina pectoris: Secondary | ICD-10-CM | POA: Diagnosis not present

## 2019-06-20 ENCOUNTER — Ambulatory Visit: Payer: Self-pay

## 2019-06-20 ENCOUNTER — Ambulatory Visit: Payer: Medicare Other | Admitting: Orthopaedic Surgery

## 2019-06-20 ENCOUNTER — Other Ambulatory Visit: Payer: Self-pay

## 2019-06-20 ENCOUNTER — Ambulatory Visit (INDEPENDENT_AMBULATORY_CARE_PROVIDER_SITE_OTHER): Payer: Medicare Other | Admitting: Orthopaedic Surgery

## 2019-06-20 DIAGNOSIS — M5431 Sciatica, right side: Secondary | ICD-10-CM

## 2019-06-20 DIAGNOSIS — M7061 Trochanteric bursitis, right hip: Secondary | ICD-10-CM | POA: Diagnosis not present

## 2019-06-20 DIAGNOSIS — I25110 Atherosclerotic heart disease of native coronary artery with unstable angina pectoris: Secondary | ICD-10-CM

## 2019-06-20 DIAGNOSIS — M79604 Pain in right leg: Secondary | ICD-10-CM

## 2019-06-20 MED ORDER — METHYLPREDNISOLONE 4 MG PO TABS: ORAL_TABLET | ORAL | 0 refills | Status: DC

## 2019-06-20 MED ORDER — TRAMADOL HCL 50 MG PO TABS: 50.0000 mg | ORAL_TABLET | Freq: Four times a day (QID) | ORAL | 0 refills | Status: DC | PRN

## 2019-06-20 NOTE — Progress Notes (Signed)
Office Visit Note   Patient: Ryan [name]           Date of Birth: [birth year]           MRN: [id number] Visit Date: 06/20/2019              Requested by: Marton Redwood, MD [address] [place],   [id number] PCP: Marton Redwood, MD   Assessment & Plan: Visit Diagnoses:  1. Pain in right leg   2. Trochanteric bursitis, right hip   3. Sciatica, right side     Plan: Since a steroid injection is helpful for of the trochanteric area we will try this again today.  He will continue Voltaren gel and I recommended this is not over-the-counter medication.  I will send in a 6-day steroid taper and some tramadol.  I would like to see him back in 4 weeks to see how he is doing overall.  No imaging is needed.  We may consider having him set up for an intra-articular steroid injection in his right hip.  If he continues to have sciatic symptoms and radicular symptoms we may end up needing an MRI of his lumbar spine but hopefully is going to be more of a hip issue that we are dealing with.  All question concerns were answered addressed.  Follow-Up Instructions: Return in about 4 weeks (around 07/18/2019).   Orders:  Orders Placed This Encounter  Procedures   XR HIP UNILAT W OR W/O PELVIS 1V RIGHT   XR Lumbar Spine 2-3 Views   Meds ordered this encounter  Medications   methylPREDNISolone (MEDROL) 4 MG tablet    Sig: Medrol dose pack. Take as instructed    Dispense:  21 tablet    Refill:  0   traMADol (ULTRAM) 50 MG tablet    Sig: Take 1-2 tablets (50-100 mg total) by mouth every 6 (six) hours as needed.    Dispense:  40 tablet    Refill:  0      Procedures: No procedures performed   Clinical Data: No additional findings.   Subjective: Chief Complaint  Patient presents with   Right Hip - Pain   Lower Back - Pain  The patient is well-known to [place].  He has a history of a left total hip arthroplasty done remotely.  He has been dealing with trochanteric bursitis on his  right hip and some osteoarthritis of the right hip.  He also has had multiple spine surgeries with no hardware in his back.  Those surgeries were done by Dr. Lorin Mercy.  He comes in today with worsening right hip pain around the trochanteric area.  He had an injection in that hip trochanteric area a year ago.  Is really flared up on him.  He is on blood thinner medication as well.  He has had no other acute changes in medical status.  He has had a physical recently.  He denies any weakness in his legs and denies any numbness and tingling in his feet.  HPI  Review of Systems He currently denies any headache, chest pain, shortness of breath, fever, chills, nausea, vomiting  Objective: Vital Signs: There were no vitals taken for this visit.  Physical Exam He is alert and orient x3 and in no acute distress but obvious discomfort Ortho Exam Examination of his right hip shows pain with internal and external rotation but most of his pain is palpation of the trochanteric area.  His leg lengths are equal.  He  is got good strength in his lower extremities. Specialty Comments:  No specialty comments available.  Imaging: Xr Hip Unilat W Or W/o Pelvis 1v Right  Result Date: 06/20/2019 An AP pelvis and lateral the right hip shows some mild arthritic changes in the hip joint itself.  The space itself is well-maintained otherwise.  There are osteophytes around the hip.  There is cortical irregularities around the trochanteric area suggesting chronic trochanteric bursitis and tendinosis.  Xr Lumbar Spine 2-3 Views  Result Date: 06/20/2019 2 views of the lumbar spine show significant arthritic changes at multiple levels throughout the lumbar spine.  There is no hardware that can be seen.  There is significant osteophytes and severe disc space narrowing at multiple levels    PMFS History: Patient Active Problem List   Diagnosis Date Noted   PAF (paroxysmal atrial fibrillation) ([place]) 01/27/2019   Dizziness  01/27/2019   History of pneumonia 01/27/2019   CKD (chronic kidney disease) stage 3, GFR 30-59 ml/min (HCC) 01/27/2019   Chest pain 02/07/2018   Elevated troponin 03/23/2017   Sigmoid diverticulitis 03/23/2017   Acute pyelonephritis 03/23/2017   AKI (acute kidney injury) ([place]) 03/23/2017   Wrist pain, acute, right    Hypothyroidism    Dyspnea 02/26/2017   Shoulder blade pain 02/26/2017   SOB (shortness of breath) 02/26/2017   Chronic right shoulder pain    Chronic combined systolic and diastolic CHF (congestive heart failure) ([place])    HLD (hyperlipidemia) 01/20/2017   Chronic cholecystitis 07/27/2016   PAD (peripheral artery disease) ([place]) 07/09/2015   S/P CABG x 4 04/05/2015   Coronary artery disease involving native coronary artery with unstable angina pectoris ([place])    Atypical chest pain 03/20/2015   Carotid artery disease ([place]) 10/09/2014   PVD (peripheral vascular disease) ([place]) 10/09/2014   HTN (hypertension) 05/30/2014   Arthritis of left hip 01/09/2014   Status post THR (total hip replacement) 01/09/2014   Spinal stenosis, lumbar 01/06/2013    Class: Diagnosis of   Coronary artery disease 01/05/2013   Atherosclerosis of native artery of extremity with intermittent claudication ([place]) 10/18/2012   Past Medical History:  Diagnosis Date   Arthritis    CAD (coronary artery disease)    Carotid artery occlusion    Cataract    Bil eyes/worse in left eye   CHF (congestive heart failure) (HCC)    Chronic back pain    DVT (deep venous thrombosis) (HCC)    Dysrhythmia    Enlarged prostate    takes Rapaflo daily   GERD (gastroesophageal reflux disease)    occasional   History of colon polyps    History of gout    has colchicine prn   History of kidney stones    Hyperlipidemia    takes Crestor daily   Hypertension    takes Amlodipine daily   Hypothyroidism    Myocardial infarction [facility])    Peripheral vascular disease  ([place])    Pulmonary emboli ([place]) 03/20/2015   elevated d-dimer, intermediate V/Q study, atypical chest pain and SOB. Start on Xarelto 20mg  BID for 3 month   Rapid atrial fibrillation (HCC)    Renal insufficiency    Shortness of breath dyspnea    Urinary frequency    Urinary urgency     Family History  Problem Relation Age of Onset   Heart disease Father    Heart attack Father    Heart disease Sister    Hypertension Sister    Heart attack Sister  Hypertension Mother    Diabetes Son     Past Surgical History:  Procedure Laterality Date   APPENDECTOMY     BACK SURGERY     5 times   big toe surgery     CARDIAC CATHETERIZATION     2010    dr Acie Fredrickson   cataract surgery     left eye   CHOLECYSTECTOMY N/A 07/27/2016   Procedure: LAPAROSCOPIC CHOLECYSTECTOMY;  Surgeon: Mickeal Skinner, MD;  Location: [place];  Service: General;  Laterality: N/A;   COLONOSCOPY     CORONARY ARTERY BYPASS GRAFT N/A 04/05/2015   Procedure: CORONARY ARTERY BYPASS GRAFTING (CABG)X4 LIMA-LAD; SVG-DIAG1-DIAG2; SVG-PD;  Surgeon: [name], MD;  Location: [place];  Service: Open Heart Surgery;  Laterality: N/A;   CORONARY/GRAFT ANGIOGRAPHY N/A 04/22/2018   Procedure: CORONARY/GRAFT ANGIOGRAPHY;  Surgeon: Martinique, Peter M, MD;  Location: [place] CV LAB;  Service: Cardiovascular;  Laterality: N/A;   CYSTOSCOPY     ENDARTERECTOMY Left 04/24/2016   Procedure: ENDARTERECTOMY LEFT CAROTID;  Surgeon: Mal Misty, MD;  Location: [place];  Service: Vascular;  Laterality: Left;   EYE SURGERY     FEMORAL ARTERY - POPLITEAL ARTERY BYPASS GRAFT     JOINT REPLACEMENT     shoulder   LEFT HEART CATHETERIZATION WITH CORONARY ANGIOGRAM N/A 04/03/2015   Procedure: LEFT HEART CATHETERIZATION WITH CORONARY ANGIOGRAM;  Surgeon: Troy Sine, MD;  [facility] C[facility];  Service: Cardiovascular;  Laterality: N/A;   LUMBAR LAMINECTOMY  01/06/2013   Procedure: MICRODISCECTOMY LUMBAR  LAMINECTOMY;  Surgeon: Marybelle Killings, MD;  Location: [place];  Service: Orthopedics;  Laterality: N/A;  L3-4 decompression   LUMBAR LAMINECTOMY/DECOMPRESSION MICRODISCECTOMY  02/12/2012   Procedure: LUMBAR LAMINECTOMY/DECOMPRESSION MICRODISCECTOMY;  Surgeon: Floyce Stakes, MD;  Location: [place] NEURO ORS;  Service: Neurosurgery;  Laterality: N/A;  Lumbar four-five laminectomy   PATCH ANGIOPLASTY Left 04/24/2016   Procedure: LEFT CAROTID ARTERY PATCH ANGIOPLASTY;  Surgeon: Mal Misty, MD;  Location: [place];  Service: Vascular;  Laterality: Left;   RIGHT HEART CATH AND CORONARY/GRAFT ANGIOGRAPHY N/A 01/30/2019   Procedure: RIGHT HEART CATH AND CORONARY/GRAFT ANGIOGRAPHY;  Surgeon: Troy Sine, MD;  Location: [place] CV LAB;  Service: Cardiovascular;  Laterality: N/A;   STERIOD INJECTION Right 01/09/2014   Procedure: STEROID INJECTION;  Surgeon: Mcarthur Rossetti, MD;  Location: [facility];  Service: Orthopedics;  Laterality: Right;   TEE WITHOUT CARDIOVERSION N/A 04/05/2015   Procedure: TRANSESOPHAGEAL ECHOCARDIOGRAM (TEE);  Surgeon: [name], MD;  Location: [facility];  Service: Open Heart Surgery;  Laterality: N/A;   TOTAL HIP ARTHROPLASTY Left 01/09/2014   DR Ninfa Linden   TOTAL HIP ARTHROPLASTY Left 01/09/2014   Procedure: LEFT TOTAL HIP ARTHROPLASTY ANTERIOR APPROACH and Steroid Injection Right hip;  Surgeon: Mcarthur Rossetti, MD;  Location: [facility];  Service: Orthopedics;  Laterality: Left;   Social History   Occupational History   Not on file  Tobacco Use   Smoking status: Former Smoker    Types: Cigarettes    Quit date: 02/04/1987    Years since quitting: 32.3   Smokeless tobacco: Former Systems developer    Types: Chew    Quit date: 07/20/2009   Tobacco comment: quit 35+yrs ago  Substance and Sexual Activity   Alcohol use: No    Alcohol/week: 0.0 standard drinks   Drug use: No   Sexual activity: Not Currently

## 2019-06-21 DIAGNOSIS — E7849 Other hyperlipidemia: Secondary | ICD-10-CM | POA: Diagnosis not present

## 2019-06-28 ENCOUNTER — Telehealth: Payer: Self-pay | Admitting: Radiology

## 2019-06-28 ENCOUNTER — Other Ambulatory Visit: Payer: Self-pay | Admitting: Orthopaedic Surgery

## 2019-06-28 MED ORDER — HYDROCODONE-ACETAMINOPHEN 5-325 MG PO TABS: 1.0000 | ORAL_TABLET | Freq: Four times a day (QID) | ORAL | 0 refills | Status: DC | PRN

## 2019-06-28 MED ORDER — TRAMADOL HCL 50 MG PO TABS: 50.0000 mg | ORAL_TABLET | Freq: Four times a day (QID) | ORAL | 0 refills | Status: DC | PRN

## 2019-06-28 NOTE — Telephone Encounter (Signed)
Patient left voicemail on triage line. He is increased pain in the right hip and groin, to the point that he cannot hardly walk. He received a right troch injection on 06/20/2019 by Dr. Ninfa Linden.  Patient would like a call to discuss the pain that he is in and what should be done.  [place] cell [phone]

## 2019-06-28 NOTE — Telephone Encounter (Signed)
See below

## 2019-06-28 NOTE — Telephone Encounter (Signed)
I will send in tramadol. 

## 2019-06-28 NOTE — Telephone Encounter (Signed)
Can we do Tramadol instead of Hydrocodone He just wants you to know it feels like his hip is catching

## 2019-06-28 NOTE — Telephone Encounter (Signed)
Have him stay off of his for the next few days.  I did send in some hydrocodone to try.  This was sent to his pharmacy.  We can then see him next week if needed.  Obviously if he is worsening in any way we can even have him seen on hilts  chedule this week.

## 2019-06-29 ENCOUNTER — Encounter: Payer: Self-pay | Admitting: Cardiovascular Disease

## 2019-06-30 ENCOUNTER — Telehealth (INDEPENDENT_AMBULATORY_CARE_PROVIDER_SITE_OTHER): Payer: Medicare Other | Admitting: Cardiovascular Disease

## 2019-06-30 ENCOUNTER — Other Ambulatory Visit: Payer: Self-pay

## 2019-06-30 ENCOUNTER — Encounter: Payer: Self-pay | Admitting: Cardiovascular Disease

## 2019-06-30 VITALS — Ht 66.0 in | Wt 171.0 lb

## 2019-06-30 DIAGNOSIS — I2510 Atherosclerotic heart disease of native coronary artery without angina pectoris: Secondary | ICD-10-CM

## 2019-06-30 DIAGNOSIS — Z7189 Other specified counseling: Secondary | ICD-10-CM

## 2019-06-30 DIAGNOSIS — I2583 Coronary atherosclerosis due to lipid rich plaque: Secondary | ICD-10-CM

## 2019-06-30 DIAGNOSIS — I480 Paroxysmal atrial fibrillation: Secondary | ICD-10-CM

## 2019-06-30 NOTE — Progress Notes (Signed)
Virtual Visit via Telephone Note   This visit type was conducted due to national recommendations for restrictions regarding the COVID-19 Pandemic (e.g. social distancing) in an effort to limit this patient's exposure and mitigate transmission in our community.  Due to his co-morbid illnesses, this patient is at least at moderate risk for complications without adequate follow up.  This format is felt to be most appropriate for this patient at this time.  The patient did not have access to video technology/had technical difficulties with video requiring transitioning to audio format only (telephone).  All issues noted in this document were discussed and addressed.  No physical exam could be performed with this format.  Please refer to the patient's chart for his  consent to telehealth for [facility].   Date:  06/30/2019   ID:  Ryan [name], DOB [birth year], MRN [id number]  Patient Location: Home Provider Location: Office  PCP:  Marton Redwood, MD  Cardiologist:  Mertie Moores, MD  Electrophysiologist:  None   Evaluation Performed:  Follow-Up Visit   Problem List  1. CAD - CABG  2. Essential Hypertension 3. Hyperlipidemia 4. Left carotid arterectomy  5. Atrial fib  6. Gout    Hurbert is a 78 y.o. man  With a history of hyperlipidemia, coronary artery disease, hypothyroidism  He was admitted March 31 with CP and dyspnea.   VQ was intermediate.   Was clinically thought to have a PE  So he was DC'd on NOAC. Was readmitted several days later with STEMI and cath revealed severe CAD. CT angio on April 16 showed no evidence of pulmonary embolus.   Oct. 4, 2016:  Win is doing well.  Occasional episodes of orthostatic hypotension Is eating some salty snacks. Has lots of hip pain with walking  Encouraged him to try a stationary bike or elliptical  Had peripheral arterial duplex scan ( ? At [place]) that was reportedly normal.   March 17, 2016  Thought he had some  symptoms of food poisoning - occurred after eating .  Also may have developed back issues related to taking the crestor also  He stopped the crestor and the symptoms have resolved.   June 24, 2016:  Jarad is seen today .   Spent the weekend at [facility]. - has a house there.  No CP or dyspnea.      March 10, 2017:  Zebulin is seen back after a recent hospitalization for new atrial fib .   Was started on amio and Eliquis  The echo shows an EF of 45%.  Moderate AS   Gets nausea after taking one of his meds.  Is now on Amio 400 BID,  Was also found to have gout  - was started on colchicine - feels better.  Is having some diarrhea from the colchicine   Is back in NSR   Aug. 14, 2018:  Jobanny is seen today for follow up of his CAD and atrial fib. He was hospitalized in April with urinary tract infection/pyelonephritis. He had mildly elevated troponin levels at that time. He also had acute kidney injury with a creatinine of 2.31 on admission. Creatinine has improved to 1.41  Had mildly elevated troponin levels at that time I had talked to Dr. Broadus John about doing a Leane Call .   Still gets fatigued frequently . Has claudication .  Used to see Dr. Kellie Simmering.     Apr 20, 2018: Elsie was admitted to the hospital in February, 2019 with  chest pain. Took 1 sublingual nitroglycerin and 1 hydrocodone which relieved his chest pain.  Troponin levels were mildly elevated at 0.07, 0.08. Stress Myoview revealed a very small area of inferior ischemia. She did well and we decided to treat him medically.  He did not want to have a heart catheterization at that time.  He seems to have mildly elevated troponin levels on a chronic basis.  Has had recurrent CP  - one night he thought he would need to go to the hospital . He has stopped his rosuvastatin  and is taking just 1/2 of a Bisoprolol .  Has had progressive shortness of breath  Now he wants to have a cath   Aug. 1, 2019:   Reginold is seen today for follow up visit .  No recent episodes of CP  Has lots of bruising / bleeding  - due to Eliquis therapy Has not been taking his synthroid for the past several months    Chief Complaint: Follow-up coronary artery disease, hypertension, atrial fibrillation.  June 30, 2019     CHAI ROUTH is a 78 y.o. male with history of coronary artery disease, atrial fibrillation, hyperlipidemia.  He also has has a history of hypothyroidism.  He has not been taking his Synthroid.  He complains of being tired.  Having hip trouble ,  Had a shot in his right hip by Dr. Ninfa Linden .  No CP , no dyspnea    The patient does not have symptoms concerning for COVID-19 infection (fever, chills, cough, or new shortness of breath).    Past Medical History:  Diagnosis Date  . Arthritis   . CAD (coronary artery disease)   . Carotid artery occlusion   . Cataract    Bil eyes/worse in left eye  . CHF (congestive heart failure) ([place])   . Chronic back pain   . DVT (deep venous thrombosis) ([place])   . Dysrhythmia   . Enlarged prostate    takes Rapaflo daily  . GERD (gastroesophageal reflux disease)    occasional  . History of colon polyps   . History of gout    has colchicine prn  . History of kidney stones   . Hyperlipidemia    takes Crestor daily  . Hypertension    takes Amlodipine daily  . Hypothyroidism   . Myocardial infarction ([place])   . Peripheral vascular disease ([place])   . Pulmonary emboli ([place]) 03/20/2015   elevated d-dimer, intermediate V/Q study, atypical chest pain and SOB. Start on Xarelto 20mg  BID for 3 month  . Rapid atrial fibrillation ([place])   . Renal insufficiency   . Shortness of breath dyspnea   . Urinary frequency   . Urinary urgency    Past Surgical History:  Procedure Laterality Date  . APPENDECTOMY    . BACK SURGERY     5 times  . big toe surgery    . CARDIAC CATHETERIZATION     2010    dr Acie Fredrickson  . cataract surgery     left eye  .  CHOLECYSTECTOMY N/A 07/27/2016   Procedure: LAPAROSCOPIC CHOLECYSTECTOMY;  Surgeon: Mickeal Skinner, MD;  Location: [place];  Service: General;  Laterality: N/A;  . COLONOSCOPY    . CORONARY ARTERY BYPASS GRAFT N/A 04/05/2015   Procedure: CORONARY ARTERY BYPASS GRAFTING (CABG)X4 LIMA-LAD; SVG-DIAG1-DIAG2; SVG-PD;  Surgeon: [name], MD;  Location: [place];  Service: Open Heart Surgery;  Laterality: N/A;  . CORONARY/GRAFT ANGIOGRAPHY N/A 04/22/2018   Procedure:  CORONARY/GRAFT ANGIOGRAPHY;  Surgeon: Martinique, Peter M, MD;  Location: [place] CV LAB;  Service: Cardiovascular;  Laterality: N/A;  . CYSTOSCOPY    . ENDARTERECTOMY Left 04/24/2016   Procedure: ENDARTERECTOMY LEFT CAROTID;  Surgeon: Mal Misty, MD;  Location: [place];  Service: Vascular;  Laterality: Left;  . EYE SURGERY    . FEMORAL ARTERY - POPLITEAL ARTERY BYPASS GRAFT    . JOINT REPLACEMENT     shoulder  . LEFT HEART CATHETERIZATION WITH CORONARY ANGIOGRAM N/A 04/03/2015   Procedure: LEFT HEART CATHETERIZATION WITH CORONARY ANGIOGRAM;  Surgeon: Troy Sine, MD;  Location: [facility] CATH LAB;  Service: Cardiovascular;  Laterality: N/A;  . LUMBAR LAMINECTOMY  01/06/2013   Procedure: MICRODISCECTOMY LUMBAR LAMINECTOMY;  Surgeon: Marybelle Killings, MD;  Location: [place];  Service: Orthopedics;  Laterality: N/A;  L3-4 decompression  . LUMBAR LAMINECTOMY/DECOMPRESSION MICRODISCECTOMY  02/12/2012   Procedure: LUMBAR LAMINECTOMY/DECOMPRESSION MICRODISCECTOMY;  Surgeon: Floyce Stakes, MD;  Location: [place] [facility];  Service: Neurosurgery;  Laterality: N/A;  Lumbar four-five laminectomy  . PATCH ANGIOPLASTY Left 04/24/2016   Procedure: LEFT CAROTID ARTERY PATCH ANGIOPLASTY;  Surgeon: Mal Misty, MD;  Location: [place];  Service: Vascular;  Laterality: Left;  . RIGHT HEART CATH AND CORONARY/GRAFT ANGIOGRAPHY N/A 01/30/2019   Procedure: RIGHT HEART CATH AND CORONARY/GRAFT ANGIOGRAPHY;  Surgeon: Troy Sine, MD;  Location: [place] CV LAB;   Service: Cardiovascular;  Laterality: N/A;  . STERIOD INJECTION Right 01/09/2014   Procedure: STEROID INJECTION;  Surgeon: Mcarthur Rossetti, MD;  Location: [place];  Service: Orthopedics;  Laterality: Right;  . TEE WITHOUT CARDIOVERSION N/A 04/05/2015   Procedure: TRANSESOPHAGEAL ECHOCARDIOGRAM (TEE);  Surgeon: [name], MD;  Location: [place];  Service: Open Heart Surgery;  Laterality: N/A;  . TOTAL HIP ARTHROPLASTY Left 01/09/2014   DR Ninfa Linden  . TOTAL HIP ARTHROPLASTY Left 01/09/2014   Procedure: LEFT TOTAL HIP ARTHROPLASTY ANTERIOR APPROACH and Steroid Injection Right hip;  Surgeon: Mcarthur Rossetti, MD;  Location: [place];  Service: Orthopedics;  Laterality: Left;     Current Meds  Medication Sig  . amiodarone (PACERONE) 100 MG tablet Take 1 tablet (100 mg total) by mouth daily.  Marland Kitchen amLODipine (NORVASC) 5 MG tablet Take 1 tablet (5 mg total) by mouth daily.  Marland Kitchen apixaban (ELIQUIS) 5 MG TABS tablet Take 1 tablet (5 mg total) by mouth 2 (two) times daily.  Marland Kitchen aspirin EC 81 MG EC tablet Take 1 tablet (81 mg total) by mouth daily.  . bisoprolol (ZEBETA) 5 MG tablet Take 0.5 tablets (2.5 mg total) by mouth daily.  . furosemide (LASIX) 20 MG tablet Take 1 tablet (20 mg total) by mouth daily.  Marland Kitchen HYDROcodone-acetaminophen (NORCO/VICODIN) 5-325 MG tablet Take 1 tablet by mouth every 6 (six) hours as needed for moderate pain.  . isosorbide mononitrate (IMDUR) 60 MG 24 hr tablet Take 1 tablet (60 mg total) by mouth daily.  Marland Kitchen lactose free nutrition (BOOST) LIQD Take 237 mLs by mouth daily.  . meclizine (ANTIVERT) 25 MG tablet Take 25 mg by mouth daily.   . methylPREDNISolone (MEDROL) 4 MG tablet Medrol dose pack. Take as instructed  . nitroGLYCERIN (NITROSTAT) 0.4 MG SL tablet Place 1 tablet (0.4 mg total) under the tongue every 5 (five) minutes as needed for chest pain (CP or SOB).  . pantoprazole (PROTONIX) 40 MG tablet Take 40 mg by mouth daily.  . potassium chloride SA  (K-DUR,KLOR-CON) 20 MEQ tablet TAKE TWO TABLETS BY MOUTH  DAILY  . rosuvastatin (CRESTOR) 10 MG tablet Take 10 mg by mouth every Monday, Wednesday, and Friday.   . senna (SENOKOT) 8.6 MG TABS tablet Take 1 tablet (8.6 mg total) by mouth daily.  . tamsulosin (FLOMAX) 0.4 MG CAPS capsule Take 0.4 mg by mouth at bedtime.  . traMADol (ULTRAM) 50 MG tablet Take 1-2 tablets (50-100 mg total) by mouth every 6 (six) hours as needed.     Allergies:   Codeine, Zetia [ezetimibe], and Unithroid [levothyroxine sodium]   Social History   Tobacco Use  . Smoking status: Former Smoker    Types: Cigarettes    Quit date: 02/04/1987    Years since quitting: 32.4  . Smokeless tobacco: Former Systems developer    Types: Chew    Quit date: 07/20/2009  . Tobacco comment: quit 35+yrs ago  Substance Use Topics  . Alcohol use: No    Alcohol/week: 0.0 standard drinks  . Drug use: No     Family Hx: The patient's family history includes Diabetes in his son; Heart attack in his father and sister; Heart disease in his father and sister; Hypertension in his mother and sister.  ROS:   Please see the history of present illness.     All other systems reviewed and are negative.   Prior CV studies:   The following studies were reviewed today:    Labs/Other Tests and Data Reviewed:    EKG:  No ECG reviewed.  Recent Labs: 01/27/2019: ALT 14; B Natriuretic Peptide 776.4 01/28/2019: TSH 39.642 01/31/2019: BUN 23; Creatinine, Ser 1.37; Hemoglobin 12.3; Platelets 251; Potassium 4.9; Sodium 142   Recent Lipid Panel Lab Results  Component Value Date/Time   CHOL 236 (H) 04/20/2018 09:32 AM   TRIG 137 04/20/2018 09:32 AM   HDL 43 04/20/2018 09:32 AM   CHOLHDL 5.5 (H) 04/20/2018 09:32 AM   CHOLHDL 3.8 02/08/2018 10:03 AM   LDLCALC 166 (H) 04/20/2018 09:32 AM    Wt Readings from Last 3 Encounters:  06/30/19 171 lb (77.6 kg)  02/13/19 169 lb 12.8 oz (77 kg)  01/31/19 162 lb 14.7 oz (73.9 kg)     Objective:    Vital  Signs:  Ht 5\' 6"  (1.676 m)   Wt 171 lb (77.6 kg)   BMI 27.60 kg/m    ASSESSMENT & PLAN:    1. CAD:   GERD seems to be doing well.  He is not having any episodes of angina.  2.  Atrial fibrillation: He thinks that his heart rate is well controlled.  3.  Fatigue: He informs me that he has stopped taking his levothyroxine.  I told him that this would tend to make him tired and that this long-term was a bad mistake.  He will check in with [name] for further recommendations but I encouraged him to restart it tonight.  COVID-19 Education: The signs and symptoms of COVID-19 were discussed with the patient and how to seek care for testing (follow up with PCP or arrange E-visit).  The importance of social distancing was discussed today.  Time:   Today, I have spent  18  minutes with the patient with telehealth technology discussing the above problems.     Medication Adjustments/Labs and Tests Ordered: Current medicines are reviewed at length with the patient today.  Concerns regarding medicines are outlined above.   Tests Ordered: No orders of the defined types were placed in this encounter.   Medication Changes: No orders of the defined types were placed in  this encounter.   Follow Up:  Virtual Visit in 6 month(s)  Signed, Mertie Moores, MD  06/30/2019 5:11 PM    [place]

## 2019-06-30 NOTE — Patient Instructions (Signed)

## 2019-07-03 ENCOUNTER — Other Ambulatory Visit: Payer: Self-pay | Admitting: Cardiovascular Disease

## 2019-07-03 NOTE — Telephone Encounter (Signed)
Pt last saw Dr Acie Fredrickson 06/30/19 telemedicine Covid 19, last labs 01/31/19 Creat 1.37, age 78, weight 77kg, based on specified criteria pt is on appropriate dosage of Eliquis 5mg  BID.  Will refill rx.

## 2019-07-18 ENCOUNTER — Ambulatory Visit (INDEPENDENT_AMBULATORY_CARE_PROVIDER_SITE_OTHER): Payer: Medicare Other | Admitting: Orthopaedic Surgery

## 2019-07-18 ENCOUNTER — Other Ambulatory Visit: Payer: Self-pay

## 2019-07-18 VITALS — Ht 66.0 in | Wt 167.0 lb

## 2019-07-18 DIAGNOSIS — I2583 Coronary atherosclerosis due to lipid rich plaque: Secondary | ICD-10-CM

## 2019-07-18 DIAGNOSIS — M1611 Unilateral primary osteoarthritis, right hip: Secondary | ICD-10-CM | POA: Diagnosis not present

## 2019-07-18 DIAGNOSIS — I2510 Atherosclerotic heart disease of native coronary artery without angina pectoris: Secondary | ICD-10-CM | POA: Diagnosis not present

## 2019-07-18 NOTE — Progress Notes (Signed)
The patient is well-known to me.  I been following him for worsening right hip pain for some time now.  He does have a history of a left total hip arthroplasty that we performed through a direct anterior approach a few years ago.  That hip is done very well.  His right hip hurts daily.  It is in the groin.  It is also on the lateral aspect of his hip.  We have tried steroid injections and that has not helped.  At this point his right hip pain is 10 out of 10 and 8 has detrimentally affecting his mobility, his quality life and his activities daily living.  X-rays at his last visit do confirm significant osteoarthritis of the right hip.  There is superior lateral joint space narrowing.  There is abnormality of the femoral head in terms of an irregular surface.  There para-articular osteophytes as well.  On exam he has severe pain with internal and external rotation of the right hip and all of his pain is in the groin.  There is some pain of the trochanteric area.  Again injections have failed.  He cannot take anti-inflammatories due to being on the blood thinner Eliquis.  We did go over his x-rays in detail.  At this point I have recommended hip replacement surgery.  This is been worsening for over a year now.  He is tried and failed all forms of conservative treatment including even activity modification and hip strengthening exercises.  He has had a surgery before so he is very aware of the risk and benefits of surgery.  He is sees his cardiologist regularly.  He can stop Eliquis which will have him stop preoperative and then started back the first day postoperative.  All questions and concerns were answered and addressed.  We will work on getting this scheduled in the near future.

## 2019-07-21 ENCOUNTER — Telehealth: Payer: Self-pay | Admitting: *Deleted

## 2019-07-21 NOTE — Telephone Encounter (Signed)
LVMTCB

## 2019-07-21 NOTE — Telephone Encounter (Signed)
    [facility] Pre-operative Risk Assessment    Request for surgical clearance:  1. What type of surgery is being performed? RIGHT TOTAL HIP ARTHROPLASTY    2. When is this surgery scheduled? TBD   3. What type of clearance is required (medical clearance vs. Pharmacy clearance to hold med vs. Both)? BOTH  4. Are there any medications that need to be held prior to surgery and how long? ELIQUIS, ASA   5. Practice name and name of physician performing surgery?  [facility]; DR. Zollie Beckers   6. What is your office phone number [phone]    7.   What is your office fax number [phone]  8.   Anesthesia type (None, local, MAC, general) ? NOT LISTED; GENERAL OR SPINAL ?   Julaine Hua 07/21/2019, 2:25 PM  _________________________________________________________________   (provider comments below)

## 2019-07-21 NOTE — Telephone Encounter (Addendum)
Pt takes Eliquis for afib with CHADS2VASc score of 5 (age x2, CHF, HTN, CAD/PVD). DVT and PE on PMH from March 2016 hospitalization, however per note with Dr Acie Fredrickson, CT did not show evidence of PE. SCr 1.37, CrCl 56mL/min. Ok to hold Eliquis for 3 days prior to procedure.

## 2019-07-24 NOTE — Telephone Encounter (Signed)
LM for pt to call back.

## 2019-07-24 NOTE — Telephone Encounter (Signed)
Follow up     Pts wife is returning call   Please call back  

## 2019-07-24 NOTE — Telephone Encounter (Signed)
   Primary Cardiologist: Mertie Moores, MD  Chart reviewed as part of pre-operative protocol coverage. Patient was contacted 07/24/2019 in reference to pre-operative risk assessment for pending surgery as outlined below.  Ryan [name] was last seen on 06/30/19 by Dr. Acie Fredrickson.  Since that day, Ryan [name] has done well with hx of MI, CAD and CABG in 2016, and atrial fib. Last cath 01/2019 with medical therapy and mod AS.  No chest pain and no SOB.   May hold Eliquis for 3 days prior to procedure.   .  Therefore, based on ACC/AHA guidelines, the patient would be at acceptable risk for the planned procedure without further cardiovascular testing.  I will route this recommendation to the requesting party via Epic fax function and remove from pre-op pool.  Please call with questions.  Cecilie Kicks, NP 07/24/2019, 3:29 PM

## 2019-07-24 NOTE — Telephone Encounter (Signed)
I agree that he is at low risk for his hip surgery

## 2019-07-27 ENCOUNTER — Other Ambulatory Visit: Payer: Self-pay | Admitting: Physician Assistant

## 2019-07-31 ENCOUNTER — Telehealth: Payer: Self-pay | Admitting: Pharmacist

## 2019-07-31 ENCOUNTER — Telehealth: Payer: Self-pay | Admitting: Cardiovascular Disease

## 2019-07-31 MED ORDER — REPATHA SURECLICK 140 MG/ML ~~LOC~~ SOAJ: 1.0000 "pen " | SUBCUTANEOUS | 11 refills | Status: DC

## 2019-07-31 NOTE — Telephone Encounter (Signed)
Patient wife is calling wanting to know if there is another medication patient can get, patient is currently in the donut hole for this medication amiodarone (PACERONE) 100 MG tablet. She went to pharmacy they said it would cost $400, they can't afford that.  Is there something else that be prescribed to him.

## 2019-07-31 NOTE — Telephone Encounter (Signed)
Left message for patient's wife to call back regarding cost of amiodarone

## 2019-07-31 NOTE — Telephone Encounter (Signed)
Spoke with pt about cholesterol management, he was previously approved for Repatha however copay was cost prohibitive. Discussed new [employer] to help with copay, applied online for pt and he was approved for $2500 in copay assistance. Pt and his wife are aware, they will pick up rx from the pharmacy this week and will call with concerns.

## 2019-08-01 MED ORDER — AMIODARONE HCL 100 MG PO TABS: 100.0000 mg | ORAL_TABLET | Freq: Every day | ORAL | 1 refills | Status: DC

## 2019-08-01 NOTE — Telephone Encounter (Signed)
I called [facility] on [address] and was on hold for great than 35 mins.  I was advised by Ludwig Clarks that their store is out of Amiodarone 100 mg tablets. Eddie checked other [facility] in [place] to see if they have any in stock and found that the store at [facility] has them and recommended that I send the RX to them.  I called [name], the pts wife and she states that she will be glad to pick up the pts RX at the [facility] on [address].  While on the phone [name] stated that she is concerned that the pt will be in the "donut hole' the next time he picks up his Eliquis RX next week. I gave her [employer] Pt [employer]'s phone number and advised her to contact them with questions concerning the pts eligibility for pt asst with them and that they mail her an application if he is.  She is aware to complete the application, obtain needed documents, and to bring all to the office so I can take care of the provider part and fax it in.

## 2019-08-01 NOTE — Telephone Encounter (Signed)
Spoke with patient's wife who states that patient's amiodarone is >$400 due to patient being in the donut hole. I advised that a PA for this medication was approved through 12/21/19 so I am going to reach out to [employer], LPN, our patient care advocate to see if there is anything further we can do to reduce the cost. Wife states patient has enough medication to get him through this week. I advised that we will call back with more information and patient's wife thanked me for the help.

## 2019-08-08 ENCOUNTER — Telehealth: Payer: Self-pay | Admitting: Cardiovascular Disease

## 2019-08-08 NOTE — Telephone Encounter (Signed)
   Left voicemail for patient/wife to call back to discuss need for additional cardiac testing.   Abigail Butts, PA-C 08/08/19; 4:06 PM

## 2019-08-08 NOTE — Telephone Encounter (Signed)
  Wife is calling because patient is having hip replacement on 08/16/19 and they would like for him to have a echocardiogram to make sure his heart is working right before he has the surgery.

## 2019-08-09 NOTE — Telephone Encounter (Signed)
   Spoke with patient's wife, [name], over the phone. She reports her husband is nervious about his upcoming surgery on 08/15/2019. He was recently deemed an acceptable risk for surgery 07/24/2019 without further cardiac work-up. Wife states patient has been stable from a cardiac standpoint without any new complaints of chest pain, SOB, or palpitations since 07/24/2019. Recent extensive work-up 01/2019 including an echo (EF 50-55%, and mild-moderate TR) and R/LHC (patent LIMA with L>R collaterals and occlusive native vessel/vein grafts) for which he was recommended for medical management. Given improvement in symptoms since that time and no new chest pain/SOB, I reassured Mrs. Erhardt that there is no indications for further cardiac testing at this time. Dr. Acie Fredrickson felt the patient was low risk for adverse events, therefore I do not see any utility in repeating an echocardiogram prior to his upcoming surgery. Mrs. Codrington was appreciative of the call and the reassurance. She will relay this information to her husband.  Abigail Butts, PA-C 08/09/19; 10:53 AM

## 2019-08-10 ENCOUNTER — Telehealth: Payer: Self-pay | Admitting: Cardiovascular Disease

## 2019-08-10 NOTE — Telephone Encounter (Signed)
**Note De-Identified Ryan [name] Obfuscation** The pts wife states that the pts Eliquis has gone up to $125 and they cannot afford that. She thinks he is in the donut hole.  She is advised to contact [employer] to ask questions concerning the pts eligibility to receive pt asst through them for his Eliquis and if he is eligable to request that they mail him an application.   She is aware to obtain the documentation that [employer] request they provide , complete his application and to bring all to the office so we can complete the provider part, have MD sign it and we will fax it to [employer] Pt Asst [employer].  She reports that the pt has 1 Eliquis tablet left. She is advised that we are leaving him 2 bottles of Eliquis samples at the screening table downstairs at the [facility] office at [place] thanked me calling her and states that they will pick up the Eliquis samples and that she is calling [employer] now.

## 2019-08-10 NOTE — Progress Notes (Signed)
[facility] [address], [place] [address] Phone: [phone] Fax: [phone]  Ryan [name] [facility] [address], [place] [address] Phone: [phone] Fax: [phone]      Your procedure is scheduled on August 25  Report to [facility] Main Entrance "A" at 1000 A.M., and check in at the Admitting office.  Call this number if you have problems the morning of surgery:  [phone]  Call [phone] if you have any questions prior to your surgery date Monday-Friday 8am-4pm    Remember:  Do not drink after midnight the night before your surgery  You may drink clear liquids until 0900 am the morning of your surgery.   Clear liquids allowed are: Water, Non-Citrus Juices (without pulp), Carbonated Beverages, Clear Tea, Black Coffee Only, and Gatorade    Take these medicines the morning of surgery with A SIP OF WATER  amiodarone (PACERONE) amLODipine (NORVASC) bisoprolol (ZEBETA) HYDROcodone-acetaminophen (NORCO/VICODIN if needed isosorbide mononitrate (IMDUR) levothyroxine (SYNTHROID) traMADol (ULTRAM)  7 days prior to surgery STOP taking any Aspirin (unless otherwise instructed by your surgeon), Aleve, Naproxen, Ibuprofen, Motrin, Advil, Goody's, BC's, all herbal medications, fish oil, and all vitamins.    The Morning of Surgery  Do not wear jewelry  Do not wear lotions, powders, or colognes, or deodorant  Men may shave face and neck.  Do not bring valuables to the hospital.  [facility] is not responsible for any belongings or valuables.  If you are a smoker, DO NOT Smoke 24 hours prior to surgery IF you wear a CPAP at night please bring your mask, tubing, and machine the morning of surgery   Remember that you must have someone to transport you home after your surgery, and remain with you for 24 hours if you are discharged the same  day.   Contacts, glasses, hearing aids, dentures or bridgework may not be worn into surgery.    Leave your suitcase in the car.  After surgery it may be brought to your room.  For patients admitted to the hospital, discharge time will be determined by your treatment team.  Patients discharged the day of surgery will not be allowed to drive home.    Special instructions:   - Preparing For Surgery  Before surgery, you can play an important role. Because skin is not sterile, your skin needs to be as free of germs as possible. You can reduce the number of germs on your skin by washing with CHG (chlorahexidine gluconate) Soap before surgery.  CHG is an antiseptic cleaner which kills germs and bonds with the skin to continue killing germs even after washing.    Oral Hygiene is also important to reduce your risk of infection.  Remember - BRUSH YOUR TEETH THE MORNING OF SURGERY WITH YOUR REGULAR TOOTHPASTE  Please do not use if you have an allergy to CHG or antibacterial soaps. If your skin becomes reddened/irritated stop using the CHG.  Do not shave (including legs and underarms) for at least 48 hours prior to first CHG shower. It is OK to shave your face.  Please follow these instructions carefully.   1. Shower the NIGHT BEFORE SURGERY and the MORNING OF SURGERY with CHG Soap.   2. If you chose to wash your hair, wash your hair first as usual with your normal shampoo.  3. After you shampoo, rinse your hair  and body thoroughly to remove the shampoo.  4. Use CHG as you would any other liquid soap. You can apply CHG directly to the skin and wash gently with a scrungie or a clean washcloth.   5. Apply the CHG Soap to your body ONLY FROM THE NECK DOWN.  Do not use on open wounds or open sores. Avoid contact with your eyes, ears, mouth and genitals (private parts). Wash Face and genitals (private parts)  with your normal soap.   6. Wash thoroughly, paying special attention to the  area where your surgery will be performed.  7. Thoroughly rinse your body with warm water from the neck down.  8. DO NOT shower/wash with your normal soap after using and rinsing off the CHG Soap.  9. Pat yourself dry with a CLEAN TOWEL.  10. Wear CLEAN PAJAMAS to bed the night before surgery, wear comfortable clothes the morning of surgery  11. Place CLEAN SHEETS on your bed the night of your first shower and DO NOT SLEEP WITH PETS.    Day of Surgery:  Do not apply any deodorants/lotions. Please shower the morning of surgery with the CHG soap  Please wear clean clothes to the hospital/surgery center.   Remember to brush your teeth WITH YOUR REGULAR TOOTHPASTE.   Please read over the following fact sheets that you were given.

## 2019-08-10 NOTE — Telephone Encounter (Signed)
I haved called [facility] but they are closed for lunch. Will call back after 2:30 per message on pharmacy VM.

## 2019-08-10 NOTE — Telephone Encounter (Signed)
New Message:   Wife called and said the Eliquis is too expensive. She wants to know if there is something else the pt can take instead of Eliquis?

## 2019-08-11 ENCOUNTER — Other Ambulatory Visit: Payer: Self-pay

## 2019-08-11 ENCOUNTER — Telehealth: Payer: Self-pay | Admitting: *Deleted

## 2019-08-11 ENCOUNTER — Other Ambulatory Visit (HOSPITAL_COMMUNITY)
Admission: RE | Admit: 2019-08-11 | Discharge: 2019-08-11 | Disposition: A | Discharge status: Home / Self Care | Payer: Medicare Other | Source: Ambulatory Visit | Attending: Orthopaedic Surgery | Admitting: Orthopaedic Surgery

## 2019-08-11 ENCOUNTER — Encounter (HOSPITAL_COMMUNITY)
Admission: RE | Admit: 2019-08-11 | Discharge: 2019-08-11 | Disposition: A | Discharge status: Home / Self Care | Payer: Medicare Other | Source: Ambulatory Visit | Attending: Orthopaedic Surgery | Admitting: Orthopaedic Surgery

## 2019-08-11 ENCOUNTER — Encounter (HOSPITAL_COMMUNITY): Payer: Self-pay

## 2019-08-11 DIAGNOSIS — E785 Hyperlipidemia, unspecified: Secondary | ICD-10-CM | POA: Insufficient documentation

## 2019-08-11 DIAGNOSIS — Z01818 Encounter for other preprocedural examination: Secondary | ICD-10-CM | POA: Insufficient documentation

## 2019-08-11 DIAGNOSIS — I5042 Chronic combined systolic (congestive) and diastolic (congestive) heart failure: Secondary | ICD-10-CM | POA: Insufficient documentation

## 2019-08-11 DIAGNOSIS — I2510 Atherosclerotic heart disease of native coronary artery without angina pectoris: Secondary | ICD-10-CM | POA: Diagnosis not present

## 2019-08-11 DIAGNOSIS — Z20828 Contact with and (suspected) exposure to other viral communicable diseases: Secondary | ICD-10-CM | POA: Insufficient documentation

## 2019-08-11 DIAGNOSIS — Z7901 Long term (current) use of anticoagulants: Secondary | ICD-10-CM | POA: Diagnosis not present

## 2019-08-11 DIAGNOSIS — I480 Paroxysmal atrial fibrillation: Secondary | ICD-10-CM | POA: Insufficient documentation

## 2019-08-11 DIAGNOSIS — Z86718 Personal history of other venous thrombosis and embolism: Secondary | ICD-10-CM | POA: Insufficient documentation

## 2019-08-11 DIAGNOSIS — Z79899 Other long term (current) drug therapy: Secondary | ICD-10-CM | POA: Diagnosis not present

## 2019-08-11 DIAGNOSIS — N183 Chronic kidney disease, stage 3 (moderate): Secondary | ICD-10-CM | POA: Insufficient documentation

## 2019-08-11 DIAGNOSIS — I252 Old myocardial infarction: Secondary | ICD-10-CM | POA: Insufficient documentation

## 2019-08-11 DIAGNOSIS — E039 Hypothyroidism, unspecified: Secondary | ICD-10-CM | POA: Diagnosis not present

## 2019-08-11 DIAGNOSIS — Z79890 Hormone replacement therapy (postmenopausal): Secondary | ICD-10-CM | POA: Insufficient documentation

## 2019-08-11 DIAGNOSIS — Z87891 Personal history of nicotine dependence: Secondary | ICD-10-CM | POA: Insufficient documentation

## 2019-08-11 DIAGNOSIS — I130 Hypertensive heart and chronic kidney disease with heart failure and stage 1 through stage 4 chronic kidney disease, or unspecified chronic kidney disease: Secondary | ICD-10-CM | POA: Diagnosis not present

## 2019-08-11 DIAGNOSIS — K219 Gastro-esophageal reflux disease without esophagitis: Secondary | ICD-10-CM | POA: Diagnosis not present

## 2019-08-11 DIAGNOSIS — M1611 Unilateral primary osteoarthritis, right hip: Secondary | ICD-10-CM | POA: Diagnosis not present

## 2019-08-11 DIAGNOSIS — Z96642 Presence of left artificial hip joint: Secondary | ICD-10-CM | POA: Insufficient documentation

## 2019-08-11 DIAGNOSIS — Z951 Presence of aortocoronary bypass graft: Secondary | ICD-10-CM | POA: Diagnosis not present

## 2019-08-11 LAB — BASIC METABOLIC PANEL
Anion gap: 8 (ref 5–15)
BUN: 18 mg/dL (ref 8–23)
CO2: 24 mmol/L (ref 22–32)
Calcium: 9.2 mg/dL (ref 8.9–10.3)
Chloride: 107 mmol/L (ref 98–111)
Creatinine, Ser: 1.34 mg/dL — ABNORMAL HIGH (ref 0.61–1.24)
GFR calc Af Amer: 58 mL/min — ABNORMAL LOW (ref >60)
GFR calc non Af Amer: 50 mL/min — ABNORMAL LOW (ref >60)
Glucose, Bld: 118 mg/dL — ABNORMAL HIGH (ref 70–99)
Potassium: 4.9 mmol/L (ref 3.5–5.1)
Sodium: 139 mmol/L (ref 135–145)

## 2019-08-11 LAB — CBC
HCT: 44.1 % (ref 39.0–52.0)
Hemoglobin: 14 g/dL (ref 13.0–17.0)
MCH: 31.6 pg (ref 26.0–34.0)
MCHC: 31.7 g/dL (ref 30.0–36.0)
MCV: 99.5 fL (ref 80.0–100.0)
Platelets: 271 10*3/uL (ref 150–400)
RBC: 4.43 MIL/uL (ref 4.22–5.81)
RDW: 13.3 % (ref 11.5–15.5)
WBC: 9 10*3/uL (ref 4.0–10.5)
nRBC: 0 % (ref 0.0–0.2)

## 2019-08-11 LAB — SURGICAL PCR SCREEN
MRSA, PCR: NEGATIVE
Staphylococcus aureus: NEGATIVE

## 2019-08-11 LAB — SARS CORONAVIRUS 2 (TAT 6-24 HRS): SARS Coronavirus 2: NEGATIVE

## 2019-08-11 NOTE — Progress Notes (Signed)
PCP - Marton Redwood Cardiologist - N[place]  Chest x-ray - 01/27/19 EKG - 01/30/19 Stress Test - 2016 ECHO - 01/28/19 Cardiac Cath - 01/30/19   Blood Thinner Instructions: follow your surgeon's instructions on when to stop Eliquis   Anesthesia review: yes, heart history  Patient denies shortness of breath, fever, cough and chest pain at PAT appointment   Patient verbalized understanding of instructions that were given to them at the PAT appointment. Patient was also instructed that they will need to review over the PAT instructions again at home before surgery.

## 2019-08-11 NOTE — Care Plan (Signed)
RNCM called patient to discuss upcoming Right total hip arthroplasty on 08/15/19 with Dr. Ninfa [facility]. Reviewed Ortho bundle program. Patient has a wife that will assist at discharge. Also verbalized he has a FWW and a raised/elevated toilet. No DME needed. Anticipate home health PT will be needed post-discharge. Referral made to [facility]. Choice provided. Follow up already scheduled for 08/29/19 at 9:30 am with Dr. Ninfa [facility]. Will continue to follow for any further CM needs.

## 2019-08-11 NOTE — Telephone Encounter (Signed)
Ortho bundle pre-op call completed. 

## 2019-08-14 MED ORDER — TRANEXAMIC ACID-NACL 1000-0.7 MG/100ML-% IV SOLN
1000.0000 mg | INTRAVENOUS | Status: AC
  Administered 2019-08-15: 1000 mg via INTRAVENOUS
  Filled 2019-08-14: qty 100

## 2019-08-14 NOTE — Progress Notes (Signed)
Anesthesia Chart Review:  Case: [id number] Date/Time: 08/15/19 1145   Procedure: RIGHT TOTAL HIP ARTHROPLASTY ANTERIOR APPROACH (Right )   Anesthesia type: Spinal   Pre-op diagnosis: osteoarthritis right hip   Location: [facility] OR ROOM 04 / [facility]   Surgeon: Mcarthur Rossetti, MD       DISCUSSION: Patient is a 78 year old male scheduled for the above procedure.  History includes former smoker (quit '88), CAD (MI x2 1980's with occluded RCA, distal CX; s/p CABG: LIMA-LAD, Y-SVG-D1a-D1b, SVG-PDA 04/05/15; Y-SVG occluded 04/22/18, medical therapy), afib/PAF (2018), chronic combined systolic and diastolic CHF, HLD, carotid artery disease (s/p left carotid endarterectomy 04/24/16), BPH, GERD, CKD (stage III), PE (intermediate risk VQ scan for PE 03/21/15; negative chest CTA 04/01/15), DVT, exertional dyspnea, hypothyroidism, back surgeries (L4-5 laminectomy/foraminotomoy 02/12/12; L3-4 decompression 01/06/13).  Per cardiologist Dr. Acie Fredrickson, patient is felt to be "low risk for his hip surgery." (see 07/21/19 telephone encounters).Given permission to hold Eliquis for 3 days prior to surgery.   08/11/2019 presurgical COVID-19 test negative.  If no acute changes and I would anticipate that he could proceed as planned. PT/INR on the day of surgery.    VS: BP 123/77   Pulse 91   Temp 36.9 C   Resp 20   Ht 5\' 6"  (1.676 m)   Wt 69.4 kg   SpO2 99%   BMI 24.68 kg/m    PROVIDERS: Marton Redwood, MD is PCP  Mertie Moores, MD is cardiologist. Last visit 06/30/19. Of note, in 04/20/18 note, Dr. Acie Fredrickson notes that patient "seems to have mildly elevated troponin levels on a chronic basis."   LABS: Labs reviewed: Acceptable for surgery. (all labs ordered are listed, but only abnormal results are displayed)  Labs Reviewed  BASIC METABOLIC PANEL - Abnormal; Notable for the following components:      Result Value   Glucose, Bld 118 (*)    Creatinine, Ser 1.34 (*)    GFR calc non Af Amer 50 (*)    GFR calc Af Amer 58  (*)    All other components within normal limits  SURGICAL PCR SCREEN  CBC    PFTs 02/06/19:  Ref. Range 02/06/2019 10:43  FVC-Pre Latest Units: L 2.63  FVC-%Pred-Pre Latest Units: % 78  FEV1-Pre Latest Units: L 2.04  FEV1-%Pred-Pre Latest Units: % 86  Pre FEV1/FVC ratio Latest Units: % 78  FEV1FVC-%Pred-Pre Latest Units: % 107  FEF 25-75 Pre Latest Units: L/sec 1.86  FEF2575-%Pred-Pre Latest Units: % 111    Ref. Range 02/06/2019 10:43  FVC-Post Latest Units: L 2.72  FVC-%Pred-Post Latest Units: % 81  FVC-%Change-Post Latest Units: % 3  FEV1-Post Latest Units: L 2.34  FEV1-%Pred-Post Latest Units: % 98  FEV1-%Change-Post Latest Units: % 14  Post FEV1/FVC ratio Latest Units: % 86  FEV1FVC-%Change-Post Latest Units: % 10  FEF 25-75 Post Latest Units: L/sec 3.43  FEF2575-%Pred-Post Latest Units: % 204  FEF2575-%Change-Post Latest Units: % 84    Ref. Range 02/06/2019 10:43  DLCO cor Latest Units: ml/min/mmHg 12.38  DLCO cor % pred Latest Units: % 58  DLCO unc Latest Units: ml/min/mmHg 11.50  DLCO unc % pred Latest Units: % 54    IMAGES: CXR 01/27/19: IMPRESSION: Stable aside from subtle increased pulmonary interstitial markings since February 2019. Top differential considerations include viral/atypical respiratory infection, developing interstitial edema, and progressed chronic lung disease.   EKG: 01/30/19: Sinus bradycardia with marked sinus arrhythmia with 1st degree A-V block Prolonged QT Abnormal ECG Confirmed by Allred, James ([id number]) on  01/30/2019 8:25:18 PM   CV: Cardiac cath 01/30/19: Conclusion:  Ost 1st Diag lesion is 80% stenosed.  Prox RCA to Mid RCA lesion is 100% stenosed.  Ost Ramus to Ramus lesion is 90% stenosed.  LIMA and is normal in caliber.  The graft exhibits no disease.  SVG.  Origin to Prox Graft lesion is 100% stenosed.  SVG.  Origin to Prox Graft lesion is 100% stenosed.  1st Diag lesion is 95% stenosed.  Ost LAD to Prox LAD  lesion is 50% stenosed.  Ost Cx to Prox Cx lesion is 65% stenosed.  Mid Cx to Dist Cx lesion is 85% stenosed.  Prox LAD lesion is 70% stenosed.  Prox LAD to Mid LAD lesion is 50% stenosed. - Severe native coronary artery disease with diffuse proximal to mid LAD stenosis with diffusely diseased native LAD proximally and mid with competitive filling from the LIMA graft; 90% proximal stenosis in the ramus immediate vessel, diffuse stenosis of 60 to 70% in the proximal circumflex with diffuse 80% distal stenosis and total occlusion of the AV groove after marginal vessel with total occlusion of the proximal native RCA.  There is extensive collateralization to the distal RCA both via the circumflex vessel as well as via the LIMA to LAD and distal LAD. - Patent LIMA graft supplying the mid LAD - Previously documented old occlusion of the previous Y graft which had supplied the ramus and marginal vessel in the vein graft which had supplied the PDA. - Low right heart pressures with mean PA pressure at 19 mmHg. RECOMMENDATION: Increase medical therapy.  The patient has only been on low-dose anti-ischemic medical regimen consisting of bisoprolol 2.5 mg as well as isosorbide 60 mg.  Recommend initiation of amlodipine 5 mg.  Also recommend consideration of Ranexa 500 mg twice a day.  The patient is on rosuvastatin.  However if the patient is to be on atorvastatin, the maximum recommended dose of atorvastatin is 40 mg if the patient is on ranolazine.   Echo 01/28/19: IMPRESSIONS  1. The left ventricle has low normal systolic function of [id number]. The cavity size was normal. There is no increased left ventricular wall thickness. Left ventricular diastology could not be evaluated due to indeterminent diastolic function. Elevated  left ventricular end-diastolic pressure The E/e' is 25.  2. The right ventricle has mildly reduced systolic function. The cavity was mildly enlarged. There is no increase in right  ventricular wall thickness. Right ventricular systolic pressure could not be assessed.  3. The tricuspid valve is normal in structure. Tricuspid valve regurgitation is mild-moderate.  4. There is moderate calcification of the aortic valve without aortic stenosis.  5. The mitral valve is normal in structure. There is mild thickening and mild calcification.   Carotid US 10/17/18: Summary: - Right Carotid: Velocities in the right ICA are consistent with a 40-59% stenosis. The ECA appears >50% stenosed. - Left Carotid: Velocities in the left ICA are consistent with a 1-39% stenosis. - Vertebrals:  Bilateral vertebral arteries demonstrate antegrade flow. - Subclavians: Normal flow hemodynamics were seen in bilateral subclavian arteries.   Past Medical History:  Diagnosis Date  . Arthritis   . CAD (coronary artery disease)   . Carotid artery occlusion   . Cataract    Bil eyes/worse in left eye  . CHF (congestive heart failure) ([place])   . Chronic back pain   . DVT (deep venous thrombosis) ([place])   . Dysrhythmia   . Enlarged prostate    takes  Rapaflo daily  . GERD (gastroesophageal reflux disease)    occasional  . History of colon polyps   . History of gout    has colchicine prn  . History of kidney stones   . Hyperlipidemia    takes Crestor daily  . Hypertension    takes Amlodipine daily  . Hypothyroidism   . Myocardial infarction ([place])   . Peripheral vascular disease ([place])   . Pulmonary emboli ([place]) 03/20/2015   elevated d-dimer, intermediate V/Q study, atypical chest pain and SOB. Start on Xarelto 20mg  BID for 3 month  . Rapid atrial fibrillation ([place])   . Renal insufficiency   . Shortness of breath dyspnea   . Urinary frequency   . Urinary urgency     Past Surgical History:  Procedure Laterality Date  . APPENDECTOMY    . BACK SURGERY     5 times  . big toe surgery    . CARDIAC CATHETERIZATION     2010    dr Acie Fredrickson  . cataract surgery     left eye  . CHOLECYSTECTOMY  N/A 07/27/2016   Procedure: LAPAROSCOPIC CHOLECYSTECTOMY;  Surgeon: Mickeal Skinner, MD;  Location: [facility];  Service: General;  Laterality: N/A;  . COLONOSCOPY    . CORONARY ARTERY BYPASS GRAFT N/A 04/05/2015   Procedure: CORONARY ARTERY BYPASS GRAFTING (CABG)X4 LIMA-LAD; SVG-DIAG1-DIAG2; SVG-PD;  Surgeon: [name], MD;  Location: [facility];  Service: Open Heart Surgery;  Laterality: N/A;  . CORONARY/GRAFT ANGIOGRAPHY N/A 04/22/2018   Procedure: CORONARY/GRAFT ANGIOGRAPHY;  Surgeon: Martinique, Peter M, MD;  Location: [facility] CV LAB;  Service: Cardiovascular;  Laterality: N/A;  . CYSTOSCOPY    . ENDARTERECTOMY Left 04/24/2016   Procedure: ENDARTERECTOMY LEFT CAROTID;  Surgeon: Mal Misty, MD;  Location: [facility];  Service: Vascular;  Laterality: Left;  . EYE SURGERY    . FEMORAL ARTERY - POPLITEAL ARTERY BYPASS GRAFT    . JOINT REPLACEMENT     shoulder  . LEFT HEART CATHETERIZATION WITH CORONARY ANGIOGRAM N/A 04/03/2015   Procedure: LEFT HEART CATHETERIZATION WITH CORONARY ANGIOGRAM;  Surgeon: Troy Sine, MD;  Location: [facility] C[facility] LAB;  Service: Cardiovascular;  Laterality: N/A;  . LUMBAR LAMINECTOMY  01/06/2013   Procedure: MICRODISCECTOMY LUMBAR LAMINECTOMY;  Surgeon: Marybelle Killings, MD;  Location: [facility];  Service: Orthopedics;  Laterality: N/A;  L3-4 decompression  . LUMBAR LAMINECTOMY/DECOMPRESSION MICRODISCECTOMY  02/12/2012   Procedure: LUMBAR LAMINECTOMY/DECOMPRESSION MICRODISCECTOMY;  Surgeon: Floyce Stakes, MD;  Location: [facility]URO [facility]S;  Service: Neurosurgery;  Laterality: N/A;  Lumbar four-five laminectomy  . PATCH ANGIOPLASTY Left 04/24/2016   Procedure: LEFT CAROTID ARTERY PATCH ANGIOPLASTY;  Surgeon: Mal Misty, MD;  Location: [facility];  Service: Vascular;  Laterality: Left;  . RIGHT HEART CATH AND CORONARY/GRAFT ANGIOGRAPHY N/A 01/30/2019   Procedure: RIGHT HEART CATH AND CORONARY/GRAFT ANGIOGRAPHY;  Surgeon: Troy Sine, MD;  Location: [facility] CV LAB;  Service:  Cardiovascular;  Laterality: N/A;  . STERIOD INJECTION Right 01/09/2014   Procedure: STEROID INJECTION;  Surgeon: Mcarthur Rossetti, MD;  Location: [facility];  Service: Orthopedics;  Laterality: Right;  . TEE WITHOUT CARDIOVERSION N/A 04/05/2015   Procedure: TRANSESOPHAGEAL ECHOCARDIOGRAM (TEE);  Surgeon: [name], MD;  Location: [facility];  Service: Open Heart Surgery;  Laterality: N/A;  . TOTAL HIP ARTHROPLASTY Left 01/09/2014   DR Ninfa [facility]  . TOTAL HIP ARTHROPLASTY Left 01/09/2014   Procedure: LEFT TOTAL HIP ARTHROPLASTY ANTERIOR APPROACH and Steroid Injection Right hip;  Surgeon:  Mcarthur Rossetti, MD;  Location: [facility];  Service: Orthopedics;  Laterality: Left;    MEDICATIONS: . amiodarone (PACERONE) 100 MG tablet  . amLODipine (NORVASC) 5 MG tablet  . bisoprolol (ZEBETA) 5 MG tablet  . ELIQUIS 5 MG TABS tablet  . Evolocumab (REPATHA SURECLICK) [id number] MG/ML SOAJ  . furosemide (LASIX) 20 MG tablet  . HYDROcodone-acetaminophen (NORCO/VICODIN) 5-325 MG tablet  . isosorbide mononitrate (IMDUR) 60 MG 24 hr tablet  . levothyroxine (SYNTHROID) 50 MCG tablet  . nitroGLYCERIN (NITROSTAT) 0.4 MG SL tablet  . potassium chloride SA (K-DUR,KLOR-CON) 20 MEQ tablet  . senna (SENOKOT) 8.6 MG TABS tablet  . tamsulosin (FLOMAX) 0.4 MG CAPS capsule  . traMADol (ULTRAM) 50 MG tablet   No current facility-administered medications for this encounter.     Myra Gianotti, PA-C Surgical Short Stay/Anesthesiology [facility] Phone [phone] [facility] Phone [phone] 08/14/2019 9:56 AM

## 2019-08-14 NOTE — Anesthesia Preprocedure Evaluation (Addendum)
Anesthesia Evaluation  Patient identified by MRN, date of birth, ID band Patient awake    Reviewed: Allergy & Precautions, NPO status , Patient's Chart, lab work & pertinent test results  Airway Mallampati: I  TM Distance: >3 FB Neck ROM: Full    Dental  (+) Edentulous Upper, Edentulous Lower   Pulmonary former smoker,    breath sounds clear to auscultation       Cardiovascular hypertension, Pt. on medications and Pt. on home beta blockers + CAD, + Past MI, + Peripheral Vascular Disease and +CHF  + dysrhythmias Atrial Fibrillation  Rhythm:Regular Rate:Normal     Neuro/Psych negative neurological ROS  negative psych ROS   GI/Hepatic Neg liver ROS, GERD  ,  Endo/Other  Hypothyroidism   Renal/GU Renal disease     Musculoskeletal  (+) Arthritis ,   Abdominal Normal abdominal exam  (+)   Peds  Hematology negative hematology ROS (+)   Anesthesia Other Findings   Reproductive/Obstetrics                            Anesthesia Physical Anesthesia Plan  ASA: III  Anesthesia Plan: Spinal   Post-op Pain Management:    Induction: Intravenous  PONV Risk Score and Plan: 2 and Ondansetron and Propofol infusion  Airway Management Planned: Natural Airway and Simple Face Mask  Additional Equipment: None  Intra-op Plan:   Post-operative Plan:   Informed Consent: I have reviewed the patients History and Physical, chart, labs and discussed the procedure including the risks, benefits and alternatives for the proposed anesthesia with the patient or authorized representative who has indicated his/her understanding and acceptance.     Dental advisory given  Plan Discussed with: CRNA  Anesthesia Plan Comments: (PAT note written 08/14/2019 by Myra Gianotti, PA-C. )       Anesthesia Quick Evaluation

## 2019-08-15 ENCOUNTER — Inpatient Hospital Stay (HOSPITAL_COMMUNITY)
Admission: RE | Admit: 2019-08-15 | Discharge: 2019-08-17 | DRG: 470 | Disposition: A | Discharge status: Home Health | Payer: Medicare Other | Attending: Orthopaedic Surgery | Admitting: Orthopaedic Surgery

## 2019-08-15 ENCOUNTER — Encounter (HOSPITAL_COMMUNITY): Payer: Self-pay

## 2019-08-15 ENCOUNTER — Ambulatory Visit (HOSPITAL_COMMUNITY): Payer: Medicare Other

## 2019-08-15 ENCOUNTER — Ambulatory Visit (HOSPITAL_COMMUNITY): Payer: Medicare Other | Admitting: Vascular Surgery

## 2019-08-15 ENCOUNTER — Ambulatory Visit (HOSPITAL_COMMUNITY): Payer: Medicare Other | Admitting: Certified Registered Nurse Anesthetist

## 2019-08-15 ENCOUNTER — Encounter (HOSPITAL_COMMUNITY)
Admission: RE | Disposition: A | Discharge status: Home Health | Payer: Self-pay | Source: Home / Self Care | Attending: Orthopaedic Surgery

## 2019-08-15 ENCOUNTER — Observation Stay (HOSPITAL_COMMUNITY): Payer: Medicare Other

## 2019-08-15 ENCOUNTER — Other Ambulatory Visit: Payer: Self-pay

## 2019-08-15 DIAGNOSIS — I5042 Chronic combined systolic (congestive) and diastolic (congestive) heart failure: Secondary | ICD-10-CM | POA: Diagnosis present

## 2019-08-15 DIAGNOSIS — Z96641 Presence of right artificial hip joint: Secondary | ICD-10-CM

## 2019-08-15 DIAGNOSIS — M25751 Osteophyte, right hip: Secondary | ICD-10-CM | POA: Diagnosis present

## 2019-08-15 DIAGNOSIS — Z951 Presence of aortocoronary bypass graft: Secondary | ICD-10-CM

## 2019-08-15 DIAGNOSIS — Z888 Allergy status to other drugs, medicaments and biological substances status: Secondary | ICD-10-CM

## 2019-08-15 DIAGNOSIS — Z96649 Presence of unspecified artificial hip joint: Secondary | ICD-10-CM

## 2019-08-15 DIAGNOSIS — M1611 Unilateral primary osteoarthritis, right hip: Principal | ICD-10-CM | POA: Diagnosis present

## 2019-08-15 DIAGNOSIS — Z9181 History of falling: Secondary | ICD-10-CM

## 2019-08-15 DIAGNOSIS — Z87891 Personal history of nicotine dependence: Secondary | ICD-10-CM

## 2019-08-15 DIAGNOSIS — I130 Hypertensive heart and chronic kidney disease with heart failure and stage 1 through stage 4 chronic kidney disease, or unspecified chronic kidney disease: Secondary | ICD-10-CM | POA: Diagnosis not present

## 2019-08-15 DIAGNOSIS — Z96642 Presence of left artificial hip joint: Secondary | ICD-10-CM | POA: Diagnosis not present

## 2019-08-15 DIAGNOSIS — I252 Old myocardial infarction: Secondary | ICD-10-CM

## 2019-08-15 DIAGNOSIS — I110 Hypertensive heart disease with heart failure: Secondary | ICD-10-CM | POA: Diagnosis not present

## 2019-08-15 DIAGNOSIS — G8929 Other chronic pain: Secondary | ICD-10-CM | POA: Diagnosis present

## 2019-08-15 DIAGNOSIS — N183 Chronic kidney disease, stage 3 (moderate): Secondary | ICD-10-CM | POA: Diagnosis present

## 2019-08-15 DIAGNOSIS — Z471 Aftercare following joint replacement surgery: Secondary | ICD-10-CM | POA: Diagnosis not present

## 2019-08-15 DIAGNOSIS — Z885 Allergy status to narcotic agent status: Secondary | ICD-10-CM

## 2019-08-15 DIAGNOSIS — Z86711 Personal history of pulmonary embolism: Secondary | ICD-10-CM

## 2019-08-15 DIAGNOSIS — Z95820 Peripheral vascular angioplasty status with implants and grafts: Secondary | ICD-10-CM

## 2019-08-15 DIAGNOSIS — I739 Peripheral vascular disease, unspecified: Secondary | ICD-10-CM | POA: Diagnosis not present

## 2019-08-15 DIAGNOSIS — E039 Hypothyroidism, unspecified: Secondary | ICD-10-CM | POA: Diagnosis present

## 2019-08-15 DIAGNOSIS — N400 Enlarged prostate without lower urinary tract symptoms: Secondary | ICD-10-CM | POA: Diagnosis present

## 2019-08-15 DIAGNOSIS — Z419 Encounter for procedure for purposes other than remedying health state, unspecified: Secondary | ICD-10-CM

## 2019-08-15 DIAGNOSIS — I2510 Atherosclerotic heart disease of native coronary artery without angina pectoris: Secondary | ICD-10-CM | POA: Diagnosis not present

## 2019-08-15 DIAGNOSIS — E785 Hyperlipidemia, unspecified: Secondary | ICD-10-CM | POA: Diagnosis present

## 2019-08-15 DIAGNOSIS — Z86718 Personal history of other venous thrombosis and embolism: Secondary | ICD-10-CM

## 2019-08-15 HISTORY — PX: TOTAL HIP ARTHROPLASTY: SHX124

## 2019-08-15 LAB — PROTIME-INR
INR: 1.2 (ref 0.8–1.2)
Prothrombin Time: 14.7 seconds (ref 11.4–15.2)

## 2019-08-15 SURGERY — ARTHROPLASTY, HIP, TOTAL, ANTERIOR APPROACH
Anesthesia: Spinal | Site: Hip | Laterality: Right

## 2019-08-15 MED ORDER — METHOCARBAMOL 500 MG PO TABS
500.0000 mg | ORAL_TABLET | Freq: Four times a day (QID) | ORAL | Status: DC | PRN
  Administered 2019-08-15 – 2019-08-17 (×3): 500 mg via ORAL
  Filled 2019-08-15 (×3): qty 1

## 2019-08-15 MED ORDER — CEFAZOLIN SODIUM-DEXTROSE 2-4 GM/100ML-% IV SOLN
INTRAVENOUS | Status: AC
  Filled 2019-08-15: qty 100

## 2019-08-15 MED ORDER — LACTATED RINGERS IV SOLN
INTRAVENOUS | Status: DC
  Administered 2019-08-15: 10:00:00 via INTRAVENOUS

## 2019-08-15 MED ORDER — ISOSORBIDE MONONITRATE ER 60 MG PO TB24
60.0000 mg | ORAL_TABLET | Freq: Every day | ORAL | Status: DC
  Administered 2019-08-16 – 2019-08-17 (×2): 60 mg via ORAL
  Filled 2019-08-15 (×2): qty 1

## 2019-08-15 MED ORDER — ONDANSETRON HCL 4 MG/2ML IJ SOLN
4.0000 mg | Freq: Four times a day (QID) | INTRAMUSCULAR | Status: DC | PRN
  Administered 2019-08-15 – 2019-08-16 (×2): 4 mg via INTRAVENOUS
  Filled 2019-08-15 (×2): qty 2

## 2019-08-15 MED ORDER — BISOPROLOL FUMARATE 5 MG PO TABS
2.5000 mg | ORAL_TABLET | Freq: Every day | ORAL | Status: DC
  Administered 2019-08-16 – 2019-08-17 (×2): 2.5 mg via ORAL
  Filled 2019-08-15 (×2): qty 0.5

## 2019-08-15 MED ORDER — 0.9 % SODIUM CHLORIDE (POUR BTL) OPTIME
TOPICAL | Status: DC | PRN
  Administered 2019-08-15: 13:00:00 1000 mL

## 2019-08-15 MED ORDER — PROPOFOL 500 MG/50ML IV EMUL
INTRAVENOUS | Status: DC | PRN
  Administered 2019-08-15: 75 ug/kg/min via INTRAVENOUS

## 2019-08-15 MED ORDER — TAMSULOSIN HCL 0.4 MG PO CAPS
0.4000 mg | ORAL_CAPSULE | Freq: Every day | ORAL | Status: DC
  Administered 2019-08-15 – 2019-08-16 (×2): 0.4 mg via ORAL
  Filled 2019-08-15 (×2): qty 1

## 2019-08-15 MED ORDER — ONDANSETRON HCL 4 MG/2ML IJ SOLN
INTRAMUSCULAR | Status: AC
  Filled 2019-08-15: qty 2

## 2019-08-15 MED ORDER — PANTOPRAZOLE SODIUM 40 MG PO TBEC
40.0000 mg | DELAYED_RELEASE_TABLET | Freq: Every day | ORAL | Status: DC
  Administered 2019-08-16 – 2019-08-17 (×2): 40 mg via ORAL
  Filled 2019-08-15 (×3): qty 1

## 2019-08-15 MED ORDER — ALUM & MAG HYDROXIDE-SIMETH 200-200-20 MG/5ML PO SUSP
30.0000 mL | ORAL | Status: DC | PRN
  Administered 2019-08-16: 05:00:00 30 mL via ORAL
  Filled 2019-08-15: qty 30

## 2019-08-15 MED ORDER — METHOCARBAMOL 1000 MG/10ML IJ SOLN
500.0000 mg | Freq: Four times a day (QID) | INTRAVENOUS | Status: DC | PRN
  Filled 2019-08-15: qty 5

## 2019-08-15 MED ORDER — SODIUM CHLORIDE 0.9 % IR SOLN
Status: DC | PRN
  Administered 2019-08-15: 3000 mL

## 2019-08-15 MED ORDER — LEVOTHYROXINE SODIUM 50 MCG PO TABS
50.0000 ug | ORAL_TABLET | Freq: Every day | ORAL | Status: DC
  Administered 2019-08-16 – 2019-08-17 (×2): 50 ug via ORAL
  Filled 2019-08-15 (×2): qty 1

## 2019-08-15 MED ORDER — PHENOL 1.4 % MT LIQD: 1.0000 | OROMUCOSAL | Status: DC | PRN

## 2019-08-15 MED ORDER — DOCUSATE SODIUM 100 MG PO CAPS
100.0000 mg | ORAL_CAPSULE | Freq: Two times a day (BID) | ORAL | Status: DC
  Administered 2019-08-15 – 2019-08-17 (×4): 100 mg via ORAL
  Filled 2019-08-15 (×4): qty 1

## 2019-08-15 MED ORDER — POLYETHYLENE GLYCOL 3350 17 G PO PACK: 17.0000 g | PACK | Freq: Every day | ORAL | Status: DC | PRN

## 2019-08-15 MED ORDER — BUPIVACAINE IN DEXTROSE 0.75-8.25 % IT SOLN
INTRATHECAL | Status: DC | PRN
  Administered 2019-08-15: 2 mL via INTRATHECAL

## 2019-08-15 MED ORDER — METOCLOPRAMIDE HCL 5 MG/ML IJ SOLN: 5.0000 mg | Freq: Three times a day (TID) | INTRAMUSCULAR | Status: DC | PRN

## 2019-08-15 MED ORDER — APIXABAN 5 MG PO TABS
5.0000 mg | ORAL_TABLET | Freq: Two times a day (BID) | ORAL | Status: DC
  Administered 2019-08-16 – 2019-08-17 (×3): 5 mg via ORAL
  Filled 2019-08-15 (×3): qty 1

## 2019-08-15 MED ORDER — OXYCODONE HCL 5 MG PO TABS
5.0000 mg | ORAL_TABLET | ORAL | Status: DC | PRN
  Administered 2019-08-15: 14:00:00 10 mg via ORAL
  Filled 2019-08-15 (×3): qty 2

## 2019-08-15 MED ORDER — EPHEDRINE SULFATE-NACL 50-0.9 MG/10ML-% IV SOSY
PREFILLED_SYRINGE | INTRAVENOUS | Status: DC | PRN
  Administered 2019-08-15 (×3): 5 mg via INTRAVENOUS

## 2019-08-15 MED ORDER — CEFAZOLIN SODIUM-DEXTROSE 1-4 GM/50ML-% IV SOLN
1.0000 g | Freq: Four times a day (QID) | INTRAVENOUS | Status: AC
  Administered 2019-08-15 – 2019-08-16 (×2): 1 g via INTRAVENOUS
  Filled 2019-08-15 (×2): qty 50

## 2019-08-15 MED ORDER — OXYCODONE HCL 5 MG PO TABS
10.0000 mg | ORAL_TABLET | ORAL | Status: DC | PRN
  Administered 2019-08-16 (×2): 15 mg via ORAL
  Administered 2019-08-17: 10:00:00 10 mg via ORAL
  Administered 2019-08-17: 05:00:00 15 mg via ORAL
  Filled 2019-08-15 (×3): qty 3

## 2019-08-15 MED ORDER — NITROGLYCERIN 0.4 MG SL SUBL: 0.4000 mg | SUBLINGUAL_TABLET | SUBLINGUAL | Status: DC | PRN

## 2019-08-15 MED ORDER — HYDROMORPHONE HCL 1 MG/ML IJ SOLN
INTRAMUSCULAR | Status: AC
  Filled 2019-08-15: qty 1

## 2019-08-15 MED ORDER — ACETAMINOPHEN 325 MG PO TABS: 325.0000 mg | ORAL_TABLET | Freq: Four times a day (QID) | ORAL | Status: DC | PRN

## 2019-08-15 MED ORDER — LIDOCAINE 2% (20 MG/ML) 5 ML SYRINGE
INTRAMUSCULAR | Status: AC
  Filled 2019-08-15: qty 5

## 2019-08-15 MED ORDER — ONDANSETRON HCL 4 MG PO TABS: 4.0000 mg | ORAL_TABLET | Freq: Four times a day (QID) | ORAL | Status: DC | PRN

## 2019-08-15 MED ORDER — MENTHOL 3 MG MT LOZG: 1.0000 | LOZENGE | OROMUCOSAL | Status: DC | PRN

## 2019-08-15 MED ORDER — CHLORHEXIDINE GLUCONATE 4 % EX LIQD: 60.0000 mL | Freq: Once | CUTANEOUS | Status: DC

## 2019-08-15 MED ORDER — METOCLOPRAMIDE HCL 5 MG PO TABS
5.0000 mg | ORAL_TABLET | Freq: Three times a day (TID) | ORAL | Status: DC | PRN
  Administered 2019-08-16: 15:00:00 10 mg via ORAL
  Filled 2019-08-15: qty 2

## 2019-08-15 MED ORDER — CEFAZOLIN SODIUM-DEXTROSE 2-4 GM/100ML-% IV SOLN
2.0000 g | INTRAVENOUS | Status: AC
  Administered 2019-08-15: 2 g via INTRAVENOUS

## 2019-08-15 MED ORDER — FUROSEMIDE 20 MG PO TABS
20.0000 mg | ORAL_TABLET | Freq: Every day | ORAL | Status: DC
  Administered 2019-08-16 – 2019-08-17 (×2): 20 mg via ORAL
  Filled 2019-08-15 (×2): qty 1

## 2019-08-15 MED ORDER — DIPHENHYDRAMINE HCL 12.5 MG/5ML PO ELIX: 12.5000 mg | ORAL_SOLUTION | ORAL | Status: DC | PRN

## 2019-08-15 MED ORDER — AMIODARONE HCL 200 MG PO TABS
100.0000 mg | ORAL_TABLET | Freq: Every day | ORAL | Status: DC
  Administered 2019-08-16 – 2019-08-17 (×2): 100 mg via ORAL
  Filled 2019-08-15 (×2): qty 1

## 2019-08-15 MED ORDER — AMLODIPINE BESYLATE 5 MG PO TABS
5.0000 mg | ORAL_TABLET | Freq: Every day | ORAL | Status: DC
  Administered 2019-08-16 – 2019-08-17 (×2): 5 mg via ORAL
  Filled 2019-08-15 (×2): qty 1

## 2019-08-15 MED ORDER — GLYCOPYRROLATE PF 0.2 MG/ML IJ SOSY
PREFILLED_SYRINGE | INTRAMUSCULAR | Status: DC | PRN
  Administered 2019-08-15 (×2): .1 mg via INTRAVENOUS

## 2019-08-15 MED ORDER — POTASSIUM CHLORIDE CRYS ER 20 MEQ PO TBCR
40.0000 meq | EXTENDED_RELEASE_TABLET | Freq: Every day | ORAL | Status: DC
  Administered 2019-08-16 – 2019-08-17 (×2): 40 meq via ORAL
  Filled 2019-08-15 (×2): qty 2

## 2019-08-15 MED ORDER — SODIUM CHLORIDE 0.9 % IV SOLN
INTRAVENOUS | Status: DC
  Administered 2019-08-15: 16:00:00 via INTRAVENOUS

## 2019-08-15 MED ORDER — ONDANSETRON HCL 4 MG/2ML IJ SOLN
INTRAMUSCULAR | Status: AC
  Administered 2019-08-16: 4 mg
  Filled 2019-08-15: qty 2

## 2019-08-15 MED ORDER — METHOCARBAMOL 500 MG PO TABS
ORAL_TABLET | ORAL | Status: AC
  Filled 2019-08-15: qty 1

## 2019-08-15 MED ORDER — POVIDONE-IODINE 10 % EX SWAB: 2.0000 "application " | Freq: Once | CUTANEOUS | Status: DC

## 2019-08-15 MED ORDER — OXYCODONE HCL 5 MG PO TABS
ORAL_TABLET | ORAL | Status: AC
  Filled 2019-08-15: qty 2

## 2019-08-15 MED ORDER — SODIUM CHLORIDE 0.9 % IV SOLN
INTRAVENOUS | Status: DC | PRN
  Administered 2019-08-15: 40 ug/min via INTRAVENOUS

## 2019-08-15 MED ORDER — HYDROMORPHONE HCL 1 MG/ML IJ SOLN
0.5000 mg | INTRAMUSCULAR | Status: DC | PRN
  Administered 2019-08-15: 15:00:00 0.5 mg via INTRAVENOUS
  Administered 2019-08-15 (×2): 1 mg via INTRAVENOUS
  Administered 2019-08-15: 15:00:00 0.5 mg via INTRAVENOUS
  Administered 2019-08-16 (×3): 1 mg via INTRAVENOUS
  Filled 2019-08-15 (×7): qty 1

## 2019-08-15 SURGICAL SUPPLY — 57 items
APL SKNCLS STERI-STRIP NONHPOA (GAUZE/BANDAGES/DRESSINGS) ×1
BENZOIN TINCTURE PRP APPL 2/3 (GAUZE/BANDAGES/DRESSINGS) ×2 IMPLANT
BLADE CLIPPER SURG (BLADE) IMPLANT
BLADE SAW SGTL 18X1.27X75 (BLADE) ×2 IMPLANT
COLLAR OFFSET CORAIL SZ 12 HIP (Stem) IMPLANT
CORAIL OFFSET COLLAR SZ 12 HIP (Stem) ×2 IMPLANT
COVER SURGICAL LIGHT HANDLE (MISCELLANEOUS) ×2 IMPLANT
COVER WAND RF STERILE (DRAPES) ×2 IMPLANT
DRAPE C-ARM 42X72 X-RAY (DRAPES) ×2 IMPLANT
DRAPE POUCH INSTRU U-SHP 10X18 (DRAPES) ×1 IMPLANT
DRAPE STERI IOBAN 125X83 (DRAPES) ×2 IMPLANT
DRAPE U-SHAPE 47X51 STRL (DRAPES) ×6 IMPLANT
DRSG AQUACEL AG ADV 3.5X10 (GAUZE/BANDAGES/DRESSINGS) ×2 IMPLANT
DURAPREP 26ML APPLICATOR (WOUND CARE) ×2 IMPLANT
ELECT BLADE 4.0 EZ CLEAN MEGAD (MISCELLANEOUS) ×2
ELECT BLADE 6.5 EXT (BLADE) IMPLANT
ELECT REM PT RETURN 9FT ADLT (ELECTROSURGICAL) ×2
ELECTRODE BLDE 4.0 EZ CLN MEGD (MISCELLANEOUS) ×1 IMPLANT
ELECTRODE REM PT RTRN 9FT ADLT (ELECTROSURGICAL) ×1 IMPLANT
FACESHIELD WRAPAROUND (MASK) ×4 IMPLANT
FACESHIELD WRAPAROUND OR TEAM (MASK) ×2 IMPLANT
GLOVE BIOGEL PI IND STRL 8 (GLOVE) ×2 IMPLANT
GLOVE BIOGEL PI INDICATOR 8 (GLOVE) ×2
GLOVE ECLIPSE 8.0 STRL XLNG CF (GLOVE) ×2 IMPLANT
GLOVE ORTHO TXT STRL SZ7.5 (GLOVE) ×4 IMPLANT
GOWN STRL REUS W/ TWL LRG LVL3 (GOWN DISPOSABLE) ×2 IMPLANT
GOWN STRL REUS W/ TWL XL LVL3 (GOWN DISPOSABLE) ×2 IMPLANT
GOWN STRL REUS W/TWL LRG LVL3 (GOWN DISPOSABLE) ×4
GOWN STRL REUS W/TWL XL LVL3 (GOWN DISPOSABLE) ×4
HANDPIECE INTERPULSE COAX TIP (DISPOSABLE) ×2
HEAD M SROM 36MM PLUS 1.5 (Hips) IMPLANT
KIT BASIN OR (CUSTOM PROCEDURE TRAY) ×2 IMPLANT
KIT TURNOVER KIT B (KITS) ×2 IMPLANT
LINER ACETAB NEUTRAL 36ID 520D (Liner) ×1 IMPLANT
MANIFOLD NEPTUNE II (INSTRUMENTS) ×2 IMPLANT
NS IRRIG 1000ML POUR BTL (IV SOLUTION) ×2 IMPLANT
PACK TOTAL JOINT (CUSTOM PROCEDURE TRAY) ×2 IMPLANT
PAD ARMBOARD 7.5X6 YLW CONV (MISCELLANEOUS) ×2 IMPLANT
PIN SECTOR W/GRIP ACE CUP 52MM (Hips) ×1 IMPLANT
SET HNDPC FAN SPRY TIP SCT (DISPOSABLE) ×1 IMPLANT
SROM M HEAD 36MM PLUS 1.5 (Hips) ×2 IMPLANT
STAPLER VISISTAT 35W (STAPLE) IMPLANT
STRIP CLOSURE SKIN 1/2X4 (GAUZE/BANDAGES/DRESSINGS) ×3 IMPLANT
SUT ETHIBOND NAB CT1 #1 30IN (SUTURE) ×2 IMPLANT
SUT MNCRL AB 4-0 PS2 18 (SUTURE) IMPLANT
SUT VIC AB 0 CT1 27 (SUTURE) ×2
SUT VIC AB 0 CT1 27XBRD ANBCTR (SUTURE) ×1 IMPLANT
SUT VIC AB 1 CT1 27 (SUTURE) ×2
SUT VIC AB 1 CT1 27XBRD ANBCTR (SUTURE) ×1 IMPLANT
SUT VIC AB 2-0 CT1 27 (SUTURE) ×2
SUT VIC AB 2-0 CT1 TAPERPNT 27 (SUTURE) ×1 IMPLANT
TOWEL GREEN STERILE (TOWEL DISPOSABLE) ×2 IMPLANT
TOWEL GREEN STERILE FF (TOWEL DISPOSABLE) ×2 IMPLANT
TRAY CATH 16FR W/PLASTIC CATH (SET/KITS/TRAYS/PACK) IMPLANT
TRAY FOLEY W/BAG SLVR 16FR (SET/KITS/TRAYS/PACK)
TRAY FOLEY W/BAG SLVR 16FR ST (SET/KITS/TRAYS/PACK) IMPLANT
WATER STERILE IRR 1000ML POUR (IV SOLUTION) ×4 IMPLANT

## 2019-08-15 NOTE — Brief Op Note (Signed)
08/15/2019  1:32 PM  PATIENT:  [name]  78 y.o. male  PRE-OPERATIVE DIAGNOSIS:  osteoarthritis right hip  POST-OPERATIVE DIAGNOSIS:  osteoarthritis right hip  PROCEDURE:  Procedure(s): RIGHT TOTAL HIP ARTHROPLASTY ANTERIOR APPROACH (Right)  SURGEON:  Surgeon(s) and Role:    Mcarthur Rossetti, MD - Primary  PHYSICIAN ASSISTANT:  Benita Stabile, PA-C  ANESTHESIA:   spinal  EBL:  200 mL   COUNTS:  YES  DICTATION: .Other Dictation: Dictation Number [id number]  PLAN OF CARE: Admit to inpatient   PATIENT DISPOSITION:  PACU - hemodynamically stable.   Delay start of Pharmacological VTE agent (>24hrs) due to surgical blood loss or risk of bleeding: no

## 2019-08-15 NOTE — Anesthesia Procedure Notes (Signed)
Spinal  Patient location during procedure: OR Start time: 08/15/2019 12:05 PM End time: 08/15/2019 12:13 PM Staffing Anesthesiologist: Suzette Battiest, MD Performed: anesthesiologist  Preanesthetic Checklist Completed: patient identified, site marked, surgical consent, pre-op evaluation, timeout performed, IV checked, risks and benefits discussed and monitors and equipment checked Spinal Block Patient position: sitting Prep: DuraPrep Patient monitoring: heart rate, cardiac monitor, continuous pulse ox and blood pressure Approach: right paramedian Location: L3-4 Injection technique: single-shot Needle Needle type: Quincke  Needle gauge: 24 G Needle length: 9 cm Assessment Sensory level: T4

## 2019-08-15 NOTE — Care Plan (Signed)
Ortho Bundle Case Management Note  Patient Details  Name: Ryan [name] MRN: [id number] Date of Birth: [birth year]     [name] called patient to discuss upcoming Right total hip arthroplasty on 08/15/19 with Dr. Ninfa [facility]. Reviewed Ortho bundle program. Patient has a wife that will assist at discharge. Also verbalized he has a FWW and a raised/elevated toilet. No DME needed. Anticipate home health PT will be needed post-discharge. Referral made to [facility]. Choice provided. Follow up already scheduled for 08/29/19 at 9:30 am with Dr. Ninfa [facility]. Will continue to follow for any further CM needs.                       DME Arranged:  (Patient verbalized he had a FWW and elevated toilet seat) DME Agency:     HH Arranged:  [place]:  [facility] (now [facility])  Additional Comments: Please contact me with any questions of if this plan should need to change.  Jamse Arn, RN, BSN, [employer]  [phone] 08/15/2019, 1:15 PM

## 2019-08-15 NOTE — Op Note (Signed)
NAME: Ryan [name], Ryan [name] MEDICAL RECORD [id number] ACCOUNT [id number] DATE OF BIRTH:1941-11-22 FACILITY: [facility] LOCATION: [facility] PHYSICIAN:[name], MD  OPERATIVE REPORT  DATE OF PROCEDURE:  08/15/2019  PREOPERATIVE DIAGNOSIS:  Primary osteoarthritis and degenerative joint disease, right hip.  POSTOPERATIVE DIAGNOSIS:  Primary osteoarthritis and degenerative joint disease, right hip.  PROCEDURE:  Right total hip arthroplasty through direct anterior approach.  IMPLANTS:  DePuy Sector Gription acetabular component size 52, size 36+0 neutral polyethylene liner, size 12 Corail femoral component with high offset, size 36+1.5 metal hip ball.  SURGEON:  Lind Guest. Ninfa Linden, MD  ASSISTANT:  Erskine Emery, PA-C.  ANESTHESIA:  Spinal.  ANTIBIOTICS:  Two grams IV Ancef.  ESTIMATED BLOOD LOSS:  [id number] mL  COMPLICATIONS:  None.  INDICATIONS:  The patient is a very pleasant 78 year old gentleman with debilitating arthritis involving his right hip.  We actually performed a left total hip arthroplasty on him 5 years ago.  His right hip now has become painful on a daily basis.  It  is detrimentally affecting his mobility, his quality of life and his activities of daily living to the point he does wish to proceed with total hip arthroplasty on the right side.  He is on a blood thinner, Eliquis, so we had him stop this 4 days before  surgery.  Having had the surgery before, he is fully aware of the risk of acute blood loss anemia, nerve and vessel injury, fracture, infection, DVT, dislocation, implant failure.  He understands our goals are to decrease pain, improve mobility and  overall improve quality of life.  DESCRIPTION OF PROCEDURE:  After informed consent was obtained and appropriate right hip was marked, he was brought to the operating room and sat up on a stretcher where spinal anesthesia was then obtained.  He was then laid in supine position on a  stretcher.  Foley  catheter was placed and traction boots were placed on both his feet.  Next, he was placed supine on the Hana fracture table, the perineal post in place and both legs in line skeletal traction device and no traction applied.  He did off  preoperatively shorter on his right operative side than the left.  His right hip was prepped and draped with DuraPrep and sterile drapes.  A time-out was called.  He was identified as correct patient, correct right hip.  I then made an incision just  inferior and posterior to the anterior superior iliac spine and carried this obliquely down the leg.  We dissected down tensor fascia lata muscle.  Tensor fascia was then divided longitudinally to proceed with direct anterior approach to the hip.  We  identified and cauterized circumflex vessels.  I then identified the hip capsule, opened the hip capsule in an L-type format, finding a moderate joint effusion and significant periarticular osteophytes around the femoral head and neck.  We placed curved  retractors around the medial and lateral femoral neck and then made our femoral neck cut with an oscillating saw and completed this with an osteotome.  We placed a corkscrew guide in the femoral head and removed the femoral head in its entirety and found  significant degenerative joint disease in that hip.  We then placed a bent Hohmann over the medial acetabular rim and removed remnants of the acetabular labrum and other debris.  We then began reaming under direct visualization from a size 44 reamer  going in stepwise increments up to a size 51 with all reamers under direct visualization, the last  reamer under direct fluoroscopy, so we could obtain our depth of reaming, our inclination and anteversion.  We then placed the real DePuy Sector Gription  acetabular component size 52 a 36+0 neutral polyethylene liner.  Attention was then turned to the femur.  With the leg externally rotated to 120 degrees, extended and adducted, we were  able to place a Mueller retractor medially and Hohman retractor in  the greater trochanter, released lateral joint capsule and used a box-cutting osteotome to enter the femoral canal and a rongeur to lateralize.  We then began broaching using the Corail broaching system from a size 8 up to a size 12.  With a size 12 in  place, we trialed a standard offset femoral neck and with a 36-2 hip ball because he started off shorter and we were afraid of his soft tissue contracture being hard to overcome.  We reduced this in the pelvis, and actually it definitely was loose and we  felt like we needed more offset and leg length.  We dislocated the hip and removed the trial components.  We then went with a high offset real femoral component, which is the Corail size 12 with high offset femoral component.  We put this down the  femoral canal.  We were pleased with the positioning of it, so then we went with a real 36+1.5 metal hip ball, reduced this in the acetabulum.  We appreciated the improved leg length, offset, range of motion and stability on exam.  This was assessed  fluoroscopically as well.  We then irrigated the soft tissue with normal saline solution using pulsatile lavage.  We were able to close the joint capsule with interrupted #1 Ethibond suture.  We then closed the tensor fascia with interrupted 2-0 Vicryl  followed by 0 Vicryl to closed the deep tissue, 0 Vicryl to close the subcutaneous tissue and 4-0 Monocryl subcuticular stitch.  A well-padded sterile dressing was applied.  He was then taken off the Hana table and taken to recovery room in stable  condition.  All final counts were correct.  There were no complications noted.  Of note, Benita Stabile, PA-C, assisted in the entire case.  His assistance was crucial for facilitating all aspects of this case.  TN/NUANCE  D:08/15/2019 T:08/15/2019 JOB:[id number]

## 2019-08-15 NOTE — Progress Notes (Signed)
PT Cancellation Note  Patient Details Name: JIONNI SANDERS MRN: [id number] DOB: 08-Oct-1941   Cancelled Treatment:    Reason Eval/Treat Not Completed: Pain limiting ability to participate Per RN, pt with increased pain and requesting to hold PT. Will follow up as schedule allows.   Leighton Ruff, PT, DPT  Acute Rehabilitation Services  Pager: [phone] Office: [phone]  Rudean Hitt 08/15/2019, 4:48 PM

## 2019-08-15 NOTE — Progress Notes (Signed)
Orthopedic Tech Progress Note Patient Details:  Ryan [name] [birth year] [id number] Applied Over Head Frame with Trapeze for patient  Patient ID: Ryan [name], male   DOB: 24-Oct-1941, 78 y.o.   MRN: [id number]   Janit Pagan 08/15/2019, 5:39 PM

## 2019-08-15 NOTE — Transfer of Care (Signed)
Immediate Anesthesia Transfer of Care Note  Patient: Ryan [name]  Procedure(s) Performed: RIGHT TOTAL HIP ARTHROPLASTY ANTERIOR APPROACH (Right Hip)  Patient Location: PACU  Anesthesia Type:Spinal  Level of Consciousness: drowsy  Airway & Oxygen Therapy: Patient Spontanous Breathing  Post-op Assessment: Report given to RN and Post -op Vital signs reviewed and stable  Post vital signs: Reviewed and stable  Last Vitals:  Vitals Value Taken Time  BP 95/52 08/15/19 1348  Temp    Pulse 70 08/15/19 1349  Resp 15 08/15/19 1349  SpO2 98 % 08/15/19 1349  Vitals shown include unvalidated device data.  Last Pain:  Vitals:   08/15/19 1007  TempSrc:   PainSc: 4       Patients Stated Pain Goal: 2 ([id number])  Complications: No apparent anesthesia complications

## 2019-08-15 NOTE — H&P (Signed)
TOTAL HIP ADMISSION H&P  Patient is admitted for right total hip arthroplasty.  Subjective:  Chief Complaint: right hip pain  HPI: Ryan [name], 78 y.o. male, has a history of pain and functional disability in the right hip(s) due to arthritis and patient has failed non-surgical conservative treatments for greater than 12 weeks to include NSAID's and/or analgesics, corticosteriod injections, use of assistive devices and activity modification.  Onset of symptoms was gradual starting 2 years ago with gradually worsening course since that time.The patient noted no past surgery on the right hip(s).  Patient currently rates pain in the right hip at 10 out of 10 with activity. Patient has night pain, worsening of pain with activity and weight bearing, pain that interfers with activities of daily living and pain with passive range of motion. Patient has evidence of subchondral cysts, subchondral sclerosis, periarticular osteophytes and joint space narrowing by imaging studies. This condition presents safety issues increasing the risk of falls.  There is no current active infection.  Patient Active Problem List   Diagnosis Date Noted  . Unilateral primary osteoarthritis, right hip 07/18/2019  . PAF (paroxysmal atrial fibrillation) ([place]) 01/27/2019  . Dizziness 01/27/2019  . History of pneumonia 01/27/2019  . CKD (chronic kidney disease) stage 3, GFR 30-59 ml/min ([facility]) 01/27/2019  . Chest pain 02/07/2018  . Elevated troponin 03/23/2017  . Sigmoid diverticulitis 03/23/2017  . Acute pyelonephritis 03/23/2017  . AKI (acute kidney injury) ([place]) 03/23/2017  . Wrist pain, acute, right   . Hypothyroidism   . Dyspnea 02/26/2017  . Shoulder blade pain 02/26/2017  . SOB (shortness of breath) 02/26/2017  . Chronic right shoulder pain   . Chronic combined systolic and diastolic CHF (congestive heart failure) ([place])   . HLD (hyperlipidemia) 01/20/2017  . Chronic cholecystitis 07/27/2016  . PAD  (peripheral artery disease) ([place]) 07/09/2015  . S/P CABG x 4 04/05/2015  . Coronary artery disease involving native coronary artery with unstable angina pectoris ([place])   . Atypical chest pain 03/20/2015  . Carotid artery disease ([place]) 10/09/2014  . PVD (peripheral vascular disease) ([place]) 10/09/2014  . HTN (hypertension) 05/30/2014  . Arthritis of left hip 01/09/2014  . Status post THR (total hip replacement) 01/09/2014  . Spinal stenosis, lumbar 01/06/2013    Class: Diagnosis of  . Coronary artery disease 01/05/2013  . Atherosclerosis of native artery of extremity with intermittent claudication ([place]) 10/18/2012   Past Medical History:  Diagnosis Date  . Arthritis   . CAD (coronary artery disease)   . Carotid artery occlusion   . Cataract    Bil eyes/worse in left eye  . CHF (congestive heart failure) ([place])   . Chronic back pain   . DVT (deep venous thrombosis) ([place])   . Dysrhythmia   . Enlarged prostate    takes Rapaflo daily  . GERD (gastroesophageal reflux disease)    occasional  . History of colon polyps   . History of gout    has colchicine prn  . History of kidney stones   . Hyperlipidemia    takes Crestor daily  . Hypertension    takes Amlodipine daily  . Hypothyroidism   . Myocardial infarction ([place])   . Peripheral vascular disease ([place])   . Pulmonary emboli ([place]) 03/20/2015   elevated d-dimer, intermediate V/Q study, atypical chest pain and SOB. Start on Xarelto 20mg  BID for 3 month  . Rapid atrial fibrillation ([place])   . Renal insufficiency   . Shortness of breath dyspnea   .  Urinary frequency   . Urinary urgency     Past Surgical History:  Procedure Laterality Date  . APPENDECTOMY    . BACK SURGERY     5 times  . big toe surgery    . CARDIAC CATHETERIZATION     2010    dr Acie Fredrickson  . cataract surgery     left eye  . CHOLECYSTECTOMY N/A 07/27/2016   Procedure: LAPAROSCOPIC CHOLECYSTECTOMY;  Surgeon: Mickeal Skinner, MD;  Location: [facility];  Service:  General;  Laterality: N/A;  . COLONOSCOPY    . CORONARY ARTERY BYPASS GRAFT N/A 04/05/2015   Procedure: CORONARY ARTERY BYPASS GRAFTING (CABG)X4 LIMA-LAD; SVG-DIAG1-DIAG2; SVG-PD;  Surgeon: [name], MD;  Location: [facility];  Service: Open Heart Surgery;  Laterality: N/A;  . CORONARY/GRAFT ANGIOGRAPHY N/A 04/22/2018   Procedure: CORONARY/GRAFT ANGIOGRAPHY;  Surgeon: Martinique, Peter M, MD;  Location: [facility];  Service: Cardiovascular;  Laterality: N/A;  . CYSTOSCOPY    . ENDARTERECTOMY Left 04/24/2016   Procedure: ENDARTERECTOMY LEFT CAROTID;  Surgeon: Mal Misty, MD;  Location: [facility];  Service: Vascular;  Laterality: Left;  . EYE SURGERY    . FEMORAL ARTERY - POPLITEAL ARTERY BYPASS GRAFT    . JOINT REPLACEMENT     shoulder  . LEFT HEART CATHETERIZATION WITH CORONARY ANGIOGRAM N/A 04/03/2015   Procedure: LEFT HEART CATHETERIZATION WITH CORONARY ANGIOGRAM;  Surgeon: Troy Sine, MD;  Location: [facility] CATH LAB;  Service: Cardiovascular;  Laterality: N/A;  . LUMBAR LAMINECTOMY  01/06/2013   Procedure: MICRODISCECTOMY LUMBAR LAMINECTOMY;  Surgeon: Marybelle Killings, MD;  Location: [facility];  Service: Orthopedics;  Laterality: N/A;  L3-4 decompression  . LUMBAR LAMINECTOMY/DECOMPRESSION MICRODISCECTOMY  02/12/2012   Procedure: LUMBAR LAMINECTOMY/DECOMPRESSION MICRODISCECTOMY;  Surgeon: Floyce Stakes, MD;  Location: [place] [facility];  Service: Neurosurgery;  Laterality: N/A;  Lumbar four-five laminectomy  . PATCH ANGIOPLASTY Left 04/24/2016   Procedure: LEFT CAROTID ARTERY PATCH ANGIOPLASTY;  Surgeon: Mal Misty, MD;  Location: [facility];  Service: Vascular;  Laterality: Left;  . RIGHT HEART CATH AND CORONARY/GRAFT ANGIOGRAPHY N/A 01/30/2019   Procedure: RIGHT HEART CATH AND CORONARY/GRAFT ANGIOGRAPHY;  Surgeon: Troy Sine, MD;  Location: [facility];  Service: Cardiovascular;  Laterality: N/A;  . STERIOD INJECTION Right 01/09/2014   Procedure: STEROID INJECTION;  Surgeon: Mcarthur Rossetti, MD;  Location: [facility];  Service: Orthopedics;  Laterality: Right;  . TEE WITHOUT CARDIOVERSION N/A 04/05/2015   Procedure: TRANSESOPHAGEAL ECHOCARDIOGRAM (TEE);  Surgeon: [name], MD;  Location: [facility];  Service: Open Heart Surgery;  Laterality: N/A;  . TOTAL HIP ARTHROPLASTY Left 01/09/2014   DR Ninfa Linden  . TOTAL HIP ARTHROPLASTY Left 01/09/2014   Procedure: LEFT TOTAL HIP ARTHROPLASTY ANTERIOR APPROACH and Steroid Injection Right hip;  Surgeon: Mcarthur Rossetti, MD;  Location: [facility];  Service: Orthopedics;  Laterality: Left;    Current Facility-Administered Medications  Medication Dose Route Frequency Provider Last Rate Last Dose  . ceFAZolin (ANCEF) 2-4 GM/100ML-% IVPB           . ceFAZolin (ANCEF) IVPB 2g/100 mL premix  2 g Intravenous On Call to OR Pete Pelt, PA-C      . chlorhexidine (HIBICLENS) 4 % liquid 4 application  60 mL Topical Once Pete Pelt, PA-C      . lactated ringers infusion   Intravenous Continuous Roberts Gaudy, MD 10 mL/hr at 08/15/19 1022    . povidone-iodine 10 % swab 2 application  2 application Topical Once Pete Pelt, PA-C      . tranexamic acid (CYKLOKAPRON) IVPB 1,000 mg  1,000 mg Intravenous To SS-Surg Mcarthur Rossetti, MD       Allergies  Allergen Reactions  . Zetia [Ezetimibe] Other (See Comments)    Myalgias   . Codeine Nausea And Vomiting       . Unithroid [Levothyroxine Sodium] Other (See Comments)    Caused blurry vision per pt    Social History   Tobacco Use  . Smoking status: Former Smoker    Types: Cigarettes    Quit date: 02/04/1987    Years since quitting: 32.5  . Smokeless tobacco: Former Systems developer    Types: Chew    Quit date: 07/20/2009  . Tobacco comment: quit 35+yrs ago  Substance Use Topics  . Alcohol use: No    Alcohol/week: 0.0 standard drinks    Family History  Problem Relation Age of Onset  . Heart disease Father   . Heart attack Father   . Heart disease Sister   . Hypertension  Sister   . Heart attack Sister   . Hypertension Mother   . Diabetes Son      Review of Systems  Musculoskeletal: Positive for joint pain.  All other systems reviewed and are negative.   Objective:  Physical Exam  Constitutional: He is oriented to person, place, and time. He appears well-developed and well-nourished.  HENT:  Head: Normocephalic and atraumatic.  Eyes: Pupils are equal, round, and reactive to light. EOM are normal.  Neck: Normal range of motion. Neck supple.  Cardiovascular: Normal rate.  Respiratory: Effort normal.  GI: Soft.  Musculoskeletal:     Right hip: He exhibits decreased range of motion, decreased strength, tenderness and bony tenderness.  Neurological: He is alert and oriented to person, place, and time.  Skin: Skin is warm and dry.  Psychiatric: He has a normal mood and affect.    Vital signs in last 24 hours: Temp:  [97.9 F (36.6 C)] 97.9 F (36.6 C) (08/25 0952) Pulse Rate:  [96] 96 (08/25 0952) Resp:  [20] 20 (08/25 0952) BP: (152)/(62) 152/62 (08/25 0952) SpO2:  [99 %] 99 % (08/25 0952) Weight:  [69.4 kg] 69.4 kg (08/25 0952)  Labs:   Estimated body mass index is 24.68 kg/m as calculated from the following:   Height as of this encounter: 5\' 6"  (1.676 m).   Weight as of this encounter: 69.4 kg.   Imaging Review Plain radiographs demonstrate severe degenerative joint disease of the right hip(s). The bone quality appears to be good for age and reported activity level.      Assessment/Plan:  End stage arthritis, right hip(s)  The patient history, physical examination, clinical judgement of the provider and imaging studies are consistent with end stage degenerative joint disease of the right hip(s) and total hip arthroplasty is deemed medically necessary. The treatment options including medical management, injection therapy, arthroscopy and arthroplasty were discussed at length. The risks and benefits of total hip arthroplasty were  presented and reviewed. The risks due to aseptic loosening, infection, stiffness, dislocation/subluxation,  thromboembolic complications and other imponderables were discussed.  The patient acknowledged the explanation, agreed to proceed with the plan and consent was signed. Patient is being admitted for inpatient treatment for surgery, pain control, PT, OT, prophylactic antibiotics, VTE prophylaxis, progressive ambulation and ADL's and discharge planning.The patient is planning to be discharged home with home health services

## 2019-08-15 NOTE — Anesthesia Postprocedure Evaluation (Signed)
Anesthesia Post Note  Patient: Ryan [name]  Procedure(s) Performed: RIGHT TOTAL HIP ARTHROPLASTY ANTERIOR APPROACH (Right Hip)     Patient location during evaluation: PACU Anesthesia Type: Spinal Level of consciousness: oriented and awake and alert Pain management: pain level controlled Vital Signs Assessment: post-procedure vital signs reviewed and stable Respiratory status: spontaneous breathing, respiratory function stable and patient connected to nasal cannula oxygen Cardiovascular status: blood pressure returned to baseline and stable Postop Assessment: no headache, no backache and no apparent nausea or vomiting Anesthetic complications: no    Last Vitals:  Vitals:   08/15/19 1530 08/15/19 1604  BP: (!) 109/52 122/68  Pulse: (!) 55 (!) 55  Resp: 15   Temp:    SpO2: 100% 100%                  Effie Berkshire

## 2019-08-16 ENCOUNTER — Encounter (HOSPITAL_COMMUNITY): Payer: Self-pay | Admitting: Orthopaedic Surgery

## 2019-08-16 DIAGNOSIS — N183 Chronic kidney disease, stage 3 (moderate): Secondary | ICD-10-CM | POA: Diagnosis not present

## 2019-08-16 DIAGNOSIS — I130 Hypertensive heart and chronic kidney disease with heart failure and stage 1 through stage 4 chronic kidney disease, or unspecified chronic kidney disease: Secondary | ICD-10-CM | POA: Diagnosis not present

## 2019-08-16 DIAGNOSIS — E785 Hyperlipidemia, unspecified: Secondary | ICD-10-CM | POA: Diagnosis not present

## 2019-08-16 DIAGNOSIS — I739 Peripheral vascular disease, unspecified: Secondary | ICD-10-CM | POA: Diagnosis not present

## 2019-08-16 DIAGNOSIS — Z885 Allergy status to narcotic agent status: Secondary | ICD-10-CM | POA: Diagnosis not present

## 2019-08-16 DIAGNOSIS — Z951 Presence of aortocoronary bypass graft: Secondary | ICD-10-CM | POA: Diagnosis not present

## 2019-08-16 DIAGNOSIS — I252 Old myocardial infarction: Secondary | ICD-10-CM | POA: Diagnosis not present

## 2019-08-16 DIAGNOSIS — M25751 Osteophyte, right hip: Secondary | ICD-10-CM | POA: Diagnosis not present

## 2019-08-16 DIAGNOSIS — Z888 Allergy status to other drugs, medicaments and biological substances status: Secondary | ICD-10-CM | POA: Diagnosis not present

## 2019-08-16 DIAGNOSIS — E039 Hypothyroidism, unspecified: Secondary | ICD-10-CM | POA: Diagnosis not present

## 2019-08-16 DIAGNOSIS — N400 Enlarged prostate without lower urinary tract symptoms: Secondary | ICD-10-CM | POA: Diagnosis not present

## 2019-08-16 DIAGNOSIS — Z86718 Personal history of other venous thrombosis and embolism: Secondary | ICD-10-CM | POA: Diagnosis not present

## 2019-08-16 DIAGNOSIS — Z96649 Presence of unspecified artificial hip joint: Secondary | ICD-10-CM

## 2019-08-16 DIAGNOSIS — Z96642 Presence of left artificial hip joint: Secondary | ICD-10-CM | POA: Diagnosis not present

## 2019-08-16 DIAGNOSIS — Z86711 Personal history of pulmonary embolism: Secondary | ICD-10-CM | POA: Diagnosis not present

## 2019-08-16 DIAGNOSIS — I5042 Chronic combined systolic (congestive) and diastolic (congestive) heart failure: Secondary | ICD-10-CM | POA: Diagnosis not present

## 2019-08-16 DIAGNOSIS — M1611 Unilateral primary osteoarthritis, right hip: Secondary | ICD-10-CM | POA: Diagnosis not present

## 2019-08-16 DIAGNOSIS — Z9181 History of falling: Secondary | ICD-10-CM | POA: Diagnosis not present

## 2019-08-16 DIAGNOSIS — Z87891 Personal history of nicotine dependence: Secondary | ICD-10-CM | POA: Diagnosis not present

## 2019-08-16 DIAGNOSIS — G8929 Other chronic pain: Secondary | ICD-10-CM | POA: Diagnosis present

## 2019-08-16 DIAGNOSIS — Z95820 Peripheral vascular angioplasty status with implants and grafts: Secondary | ICD-10-CM | POA: Diagnosis not present

## 2019-08-16 DIAGNOSIS — I2510 Atherosclerotic heart disease of native coronary artery without angina pectoris: Secondary | ICD-10-CM | POA: Diagnosis not present

## 2019-08-16 LAB — CBC
HCT: 37.9 % — ABNORMAL LOW (ref 39.0–52.0)
Hemoglobin: 12.2 g/dL — ABNORMAL LOW (ref 13.0–17.0)
MCH: 31.9 pg (ref 26.0–34.0)
MCHC: 32.2 g/dL (ref 30.0–36.0)
MCV: 99 fL (ref 80.0–100.0)
Platelets: 230 10*3/uL (ref 150–400)
RBC: 3.83 MIL/uL — ABNORMAL LOW (ref 4.22–5.81)
RDW: 13.3 % (ref 11.5–15.5)
WBC: 11.6 10*3/uL — ABNORMAL HIGH (ref 4.0–10.5)
nRBC: 0 % (ref 0.0–0.2)

## 2019-08-16 LAB — BASIC METABOLIC PANEL
Anion gap: 10 (ref 5–15)
BUN: 16 mg/dL (ref 8–23)
CO2: 24 mmol/L (ref 22–32)
Calcium: 8.3 mg/dL — ABNORMAL LOW (ref 8.9–10.3)
Chloride: 104 mmol/L (ref 98–111)
Creatinine, Ser: 1.16 mg/dL (ref 0.61–1.24)
GFR calc Af Amer: >60 mL/min (ref >60)
GFR calc non Af Amer: 60 mL/min — ABNORMAL LOW (ref >60)
Glucose, Bld: 140 mg/dL — ABNORMAL HIGH (ref 70–99)
Potassium: 4.3 mmol/L (ref 3.5–5.1)
Sodium: 138 mmol/L (ref 135–145)

## 2019-08-16 NOTE — Progress Notes (Signed)
Subjective: 1 Day Post-Op Procedure(s) (LRB): RIGHT TOTAL HIP ARTHROPLASTY ANTERIOR APPROACH (Right) Patient reports pain as moderate.    Objective: Vital signs in last 24 hours: Temp:  [97.6 F (36.4 C)-98.2 F (36.8 C)] 98.2 F (36.8 C) (08/26 0517) Pulse Rate:  [55-96] 68 (08/26 0517) Resp:  [12-20] 15 (08/25 1530) BP: (81-152)/(45-70) 124/69 (08/26 0517) SpO2:  [95 %-100 %] 97 % (08/26 0517) Weight:  [69.4 kg] 69.4 kg (08/25 0952)  Intake/Output from previous day: 08/25 0701 - 08/26 0700 In: 1953.9 [P.O.:360; I.V.:1443.9; IV Piggyback:150] Out: 1175 [Urine:975; Blood:200] Intake/Output this shift: No intake/output data recorded.  Recent Labs    08/16/19 0418  HGB 12.2*   Recent Labs    08/16/19 0418  WBC 11.6*  RBC 3.83*  HCT 37.9*  PLT 230   Recent Labs    08/16/19 0418  NA 138  K 4.3  CL 104  CO2 24  BUN 16  CREATININE 1.16  GLUCOSE 140*  CALCIUM 8.3*   Recent Labs    08/15/19 1028  INR 1.2    Sensation intact distally Intact pulses distally Dorsiflexion/Plantar flexion intact Incision: dressing C/D/I   Assessment/Plan: 1 Day Post-Op Procedure(s) (LRB): RIGHT TOTAL HIP ARTHROPLASTY ANTERIOR APPROACH (Right) Up with therapy Discharge home with home health once clears therapy.  Will need to leave the foley catheter in place until just before discharge due to urinary/bladder issues.      Mcarthur Rossetti 08/16/2019, 8:12 AM

## 2019-08-16 NOTE — Plan of Care (Signed)
  Problem: Elimination: Goal: Will not experience complications related to bowel motility Outcome: Progressing   Problem: Elimination: Goal: Will not experience complications related to urinary retention Outcome: Progressing   Problem: Pain Managment: Goal: General experience of comfort will improve Outcome: Progressing   Problem: Safety: Goal: Ability to remain free from injury will improve Outcome: Progressing   

## 2019-08-16 NOTE — Progress Notes (Signed)
Noted order to remove foley at 6 am this morning. Pt refused to let me remove foley due to the prior problems experienced with surgical procedures and voiding after foley removed post op day 1. Pt was educated and verbalized understanding, but he would like to speak with the doctor before it is removed. Day shift Nurse updated and made aware of patients concern.

## 2019-08-16 NOTE — Evaluation (Addendum)
Physical Therapy Evaluation Patient Details Name: Ryan [name] MRN: [id number] DOB: Feb 03, 1941 Today's Date: 08/16/2019   History of Present Illness  Pt is a 78 y.o. male admitted 08/15/19 for elective R THA (direct anterior). PMH includes L THA (2015), afib, PE, PVD, HTN, CHF, CAD, arthritis, cataracts.    Clinical Impression  Pt presents with an overall decrease in functional mobility secondary to above. 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 Educ on precautions, positioning, therex, and importance of mobility. This morning, pt only able to tolerate steps from bed to recliner with RW, requiring consistent minA for mobility. Limited by pain, nausea and dizziness; BP 131/62. Pt would benefit from continued acute PT services to maximize functional mobility and independence prior to d/c with HHPT services.     Follow Up Recommendations Follow surgeon's recommendation for DC plan and follow-up therapies;Supervision for mobility/OOB;Home health PT    Equipment Recommendations  None (wife reports pt owns RW)   Recommendations for Other Services       Precautions / Restrictions Precautions Precautions: Fall Restrictions Weight Bearing Restrictions: Yes RLE Weight Bearing: Weight bearing as tolerated      Mobility  Bed Mobility Overal bed mobility: Needs Assistance Bed Mobility: Supine to Sit     Supine to sit: Min assist     General bed mobility comments: MinA to manage RLE; pt assisting well with BUE/LLE support  Transfers Overall transfer level: Needs assistance Equipment used: Rolling walker (2 wheeled) Transfers: Sit to/from Stand Sit to Stand: Min assist         General transfer comment: MinA to assist trunk elevation to RW, cues for correct hand placement; pt limited by c/o dizziness  Ambulation/Gait Ambulation/Gait assistance: Min assist Gait Distance (Feet): 2 Feet Assistive device: Rolling walker (2 wheeled) Gait  Pattern/deviations: Step-to pattern;Leaning posteriorly;Trunk flexed Gait velocity: Decreased   General Gait Details: Pivotal steps from bed to recliner with RW and minA; further distance limited by pain, nausea, dizziness. BP 131/62  Stairs            Wheelchair Mobility    Modified Rankin (Stroke Patients Only)       Balance                                             Pertinent Vitals/Pain Pain Assessment: Faces Faces Pain Scale: Hurts little more Pain Location: R hip Pain Descriptors / Indicators: Guarding;Grimacing Pain Intervention(s): Monitored during session;Limited activity within patient's tolerance    Home Living Family/patient expects to be discharged to:: Private residence Living Arrangements: Spouse/significant other Available Help at Discharge: Family;Available 24 hours/day Type of Home: House Home Access: Stairs to enter Entrance Stairs-Rails: Right Entrance Stairs-Number of Steps: 2 Home Layout: Two level;Able to live on main level with bedroom/bathroom;Full bath on main level        Prior Function Level of Independence: Independent         Comments: Drives. Independent. 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, still working     Journalist, newspaper        Extremity/Trunk Assessment   Upper Extremity Assessment Upper Extremity Assessment: Overall WFL for tasks assessed    Lower Extremity Assessment Lower Extremity Assessment: Generalized weakness       Communication   Communication: HOH  Cognition Arousal/Alertness: Awake/alert Behavior During Therapy: WFL for tasks assessed/performed Overall Cognitive Status:  Impaired/Different from baseline Area of Impairment: Problem solving;Attention                   Current Attention Level: Selective         Problem Solving: Slow processing;Difficulty sequencing;Requires verbal cues General Comments: WFL for simple tasks; A&O x4. Some slowed problem solving, poor  short-term memory and decreased attention noted; likely baseline cognition exacerbated by pain/fatigue      General Comments      Exercises Total Joint Exercises Long Arc Quad: AROM;Right;Seated   Assessment/Plan    PT Assessment Patient needs continued PT services  PT Problem List Decreased strength;Decreased range of motion;Decreased activity tolerance;Decreased balance;Decreased mobility;Decreased knowledge of use of DME;Decreased safety awareness;Decreased knowledge of precautions;Pain       PT Treatment Interventions DME instruction;Gait training;Stair training;Functional mobility training;Therapeutic activities;Therapeutic exercise;Balance training;Patient/family education    PT Goals (Current goals can be found in the Care Plan section)  Acute Rehab PT Goals Patient Stated Goal: Feel better PT Goal Formulation: With patient Time For Goal Achievement: 08/30/19 Potential to Achieve Goals: Good    Frequency 7X/week   Barriers to discharge        Co-evaluation               AM-PAC PT "6 Clicks" Mobility  Outcome Measure Help needed turning from your back to your side while in a flat bed without using bedrails?: A Little Help needed moving from lying on your back to sitting on the side of a flat bed without using bedrails?: A Little Help needed moving to and from a bed to a chair (including a wheelchair)?: A Little Help needed standing up from a chair using your arms (e.g., wheelchair or bedside chair)?: A Little Help needed to walk in hospital room?: A Little Help needed climbing 3-5 steps with a railing? : A Little 6 Click Score: 18    End of Session Equipment Utilized During Treatment: Gait belt Activity Tolerance: Patient limited by fatigue;Patient limited by pain Patient left: in chair;with call bell/phone within reach Nurse Communication: Mobility status PT Visit Diagnosis: Other abnormalities of gait and mobility (R26.89);Muscle weakness (generalized)  (M62.81);Pain Pain - Right/Left: Right Pain - part of body: Hip    Time: [id number] PT Time Calculation (min) (ACUTE ONLY): 28 min   Charges:   PT Evaluation $PT Eval Moderate Complexity: 1 Mod PT Treatments $Therapeutic Activity: 8-22 mins   Mabeline Caras, PT, DPT Acute Rehabilitation Services  Pager [phone] Office [place] 08/16/2019, 9:37 AM

## 2019-08-16 NOTE — Progress Notes (Signed)
Physical Therapy Treatment Patient Details Name: Ryan [name] MRN: [id number] DOB: 07-22-1941 Today's Date: 08/16/2019    History of Present Illness Pt is a 78 y.o. male admitted 08/15/19 for elective R THA (direct anterior). PMH includes L THA (2015), afib, PE, PVD, HTN, CHF, CAD, arthritis, cataracts.   PT Comments    Pt progressing with mobility. Able to increase ambulation distance this session with RW and intermittent min guard; pt with 1x LOB requiring assist to correct. Limited by c/o dizziness, although pt reports improved since this morning; BP 133/60. Wife present throughout session. Reviewed therex, precautions and importance of mobility. Pt planning to d/c home tomorrow afternoon after second PT session.    Follow Up Recommendations  Follow surgeon's recommendation for DC plan and follow-up therapies;Supervision for mobility/OOB;Home health PT     Equipment Recommendations  None recommended by PT    Recommendations for Other Services       Precautions / Restrictions Precautions Precautions: Fall Restrictions Weight Bearing Restrictions: Yes RLE Weight Bearing: Weight bearing as tolerated    Mobility  Bed Mobility               General bed mobility comments: Received sitting in recliner  Transfers Overall transfer level: Needs assistance Equipment used: Rolling walker (2 wheeled) Transfers: Sit to/from Stand Sit to Stand: Min guard;Supervision         General transfer comment: Heavy reliance on BUE support on recliner armrests to push into standing, initial min guard for balance transitioning UE suppor to RW. Performed additional trial with supervision; improved eccentric control  Ambulation/Gait Ambulation/Gait assistance: Min guard;Min assist Gait Distance (Feet): 28 Feet(+28) Assistive device: Rolling walker (2 wheeled) Gait Pattern/deviations: Step-through pattern;Decreased stride length Gait velocity: Decreased   General Gait Details: Amb  64' + 28' with 1x seated rest break due to fatigue/dizziness; BP 133/60. Slow, antalgic gait with RW and min guard; 1x posterior LOB requiring minA to correct   Stairs             Wheelchair Mobility    Modified Rankin (Stroke Patients Only)       Balance Overall balance assessment: Needs assistance Sitting-balance support: No upper extremity supported;Feet supported Sitting balance-Leahy Scale: Fair Sitting balance - Comments: Unable to reach bilateral feet due to pain     Standing balance-Leahy Scale: Poor Standing balance comment: Reliant on UE support                            Cognition Arousal/Alertness: Awake/alert Behavior During Therapy: WFL for tasks assessed/performed Overall Cognitive Status: Impaired/Different from baseline Area of Impairment: Problem solving;Attention                   Current Attention Level: Selective         Problem Solving: Difficulty sequencing;Requires verbal cues        Exercises General Exercises - Lower Extremity Long Arc Quad: AROM;Right;Seated Hip Flexion/Marching: AROM;Right;Seated    General Comments General comments (skin integrity, edema, etc.): Wife present and supportive      Pertinent Vitals/Pain Pain Assessment: Faces Faces Pain Scale: Hurts little more Pain Location: R hip Pain Descriptors / Indicators: Guarding;Grimacing Pain Intervention(s): Monitored during session    Home Living                      Prior Function            PT Goals (current goals  can now be found in the care plan section) Acute Rehab PT Goals Patient Stated Goal: Feel better and home tomorrow PT Goal Formulation: With patient Potential to Achieve Goals: Good Progress towards PT goals: Progressing toward goals    Frequency    7X/week      PT Plan Current plan remains appropriate    Co-evaluation              AM-PAC PT "6 Clicks" Mobility   Outcome Measure  Help needed  turning from your back to your side while in a flat bed without using bedrails?: A Little Help needed moving from lying on your back to sitting on the side of a flat bed without using bedrails?: A Little Help needed moving to and from a bed to a chair (including a wheelchair)?: A Little Help needed standing up from a chair using your arms (e.g., wheelchair or bedside chair)?: A Little Help needed to walk in hospital room?: A Little Help needed climbing 3-5 steps with a railing? : A Little 6 Click Score: 18    End of Session Equipment Utilized During Treatment: Gait belt Activity Tolerance: Patient tolerated treatment well Patient left: in chair;with call bell/phone within reach;with family/visitor present Nurse Communication: Mobility status PT Visit Diagnosis: Other abnormalities of gait and mobility (R26.89);Muscle weakness (generalized) (M62.81);Pain Pain - Right/Left: Right Pain - part of body: Hip     Time: 1533-1550 PT Time Calculation (min) (ACUTE ONLY): 17 min  Charges:  $Gait Training: 8-22 mins                    Mabeline Caras, PT, DPT Acute Rehabilitation Services  Pager [phone] Office [place] 08/16/2019, 5:29 PM

## 2019-08-16 NOTE — Discharge Instructions (Signed)
INSTRUCTIONS AFTER JOINT REPLACEMENT  ° °o Remove items at home which could result in a fall. This includes throw rugs or furniture in walking pathways °o ICE to the affected joint every three hours while awake for 30 minutes at a time, for at least the first 3-5 days, and then as needed for pain and swelling.  Continue to use ice for pain and swelling. You may notice swelling that will progress down to the foot and ankle.  This is normal after surgery.  Elevate your leg when you are not up walking on it.   °o Continue to use the breathing machine you got in the hospital (incentive spirometer) which will help keep your temperature down.  It is common for your temperature to cycle up and down following surgery, especially at night when you are not up moving around and exerting yourself.  The breathing machine keeps your lungs expanded and your temperature down. ° ° °DIET:  As you were doing prior to hospitalization, we recommend a well-balanced diet. ° °DRESSING / WOUND CARE / SHOWERING ° °Keep the surgical dressing until follow up.  The dressing is water proof, so you can shower without any extra covering.  IF THE DRESSING FALLS OFF or the wound gets wet inside, change the dressing with sterile gauze.  Please use good hand washing techniques before changing the dressing.  Do not use any lotions or creams on the incision until instructed by your surgeon.   ° °ACTIVITY ° °o Increase activity slowly as tolerated, but follow the weight bearing instructions below.   °o No driving for 6 weeks or until further direction given by your physician.  You cannot drive while taking narcotics.  °o No lifting or carrying greater than 10 lbs. until further directed by your surgeon. °o Avoid periods of inactivity such as sitting longer than an hour when not asleep. This helps prevent blood clots.  °o You may return to work once you are authorized by your doctor.  ° ° ° °WEIGHT BEARING  ° °Weight bearing as tolerated with assist  device (walker, cane, etc) as directed, use it as long as suggested by your surgeon or therapist, typically at least 4-6 weeks. ° ° °EXERCISES ° °Results after joint replacement surgery are often greatly improved when you follow the exercise, range of motion and muscle strengthening exercises prescribed by your doctor. Safety measures are also important to protect the joint from further injury. Any time any of these exercises cause you to have increased pain or swelling, decrease what you are doing until you are comfortable again and then slowly increase them. If you have problems or questions, call your caregiver or physical therapist for advice.  ° °Rehabilitation is important following a joint replacement. After just a few days of immobilization, the muscles of the leg can become weakened and shrink (atrophy).  These exercises are designed to build up the tone and strength of the thigh and leg muscles and to improve motion. Often times heat used for twenty to thirty minutes before working out will loosen up your tissues and help with improving the range of motion but do not use heat for the first two weeks following surgery (sometimes heat can increase post-operative swelling).  ° °These exercises can be done on a training (exercise) mat, on the floor, on a table or on a bed. Use whatever works the best and is most comfortable for you.    Use music or television while you are exercising so that   the exercises are a pleasant break in your day. This will make your life better with the exercises acting as a break in your routine that you can look forward to.   Perform all exercises about fifteen times, three times per day or as directed.  You should exercise both the operative leg and the other leg as well. ° °Exercises include: °  °• Quad Sets - Tighten up the muscle on the front of the thigh (Quad) and hold for 5-10 seconds.   °• Straight Leg Raises - With your knee straight (if you were given a brace, keep it on),  lift the leg to 60 degrees, hold for 3 seconds, and slowly lower the leg.  Perform this exercise against resistance later as your leg gets stronger.  °• Leg Slides: Lying on your back, slowly slide your foot toward your buttocks, bending your knee up off the floor (only go as far as is comfortable). Then slowly slide your foot back down until your leg is flat on the floor again.  °• Angel Wings: Lying on your back spread your legs to the side as far apart as you can without causing discomfort.  °• Hamstring Strength:  Lying on your back, push your heel against the floor with your leg straight by tightening up the muscles of your buttocks.  Repeat, but this time bend your knee to a comfortable angle, and push your heel against the floor.  You may put a pillow under the heel to make it more comfortable if necessary.  ° °A rehabilitation program following joint replacement surgery can speed recovery and prevent re-injury in the future due to weakened muscles. Contact your doctor or a physical therapist for more information on knee rehabilitation.  ° ° °CONSTIPATION ° °Constipation is defined medically as fewer than three stools per week and severe constipation as less than one stool per week.  Even if you have a regular bowel pattern at home, your normal regimen is likely to be disrupted due to multiple reasons following surgery.  Combination of anesthesia, postoperative narcotics, change in appetite and fluid intake all can affect your bowels.  ° °YOU MUST use at least one of the following options; they are listed in order of increasing strength to get the job done.  They are all available over the counter, and you may need to use some, POSSIBLY even all of these options:   ° °Drink plenty of fluids (prune juice may be helpful) and high fiber foods °Colace 100 mg by mouth twice a day  °Senokot for constipation as directed and as needed Dulcolax (bisacodyl), take with full glass of water  °Miralax (polyethylene glycol)  once or twice a day as needed. ° °If you have tried all these things and are unable to have a bowel movement in the first 3-4 days after surgery call either your surgeon or your primary doctor.   ° °If you experience loose stools or diarrhea, hold the medications until you stool forms back up.  If your symptoms do not get better within 1 week or if they get worse, check with your doctor.  If you experience "the worst abdominal pain ever" or develop nausea or vomiting, please contact the office immediately for further recommendations for treatment. ° ° °ITCHING:  If you experience itching with your medications, try taking only a single pain pill, or even half a pain pill at a time.  You can also use Benadryl over the counter for itching or also to   help with sleep.   TED HOSE STOCKINGS:  Use stockings on both legs until for at least 2 weeks or as directed by physician office. They may be removed at night for sleeping.  MEDICATIONS:  See your medication summary on the After Visit Summary that nursing will review with you.  You may have some home medications which will be placed on hold until you complete the course of blood thinner medication.  It is important for you to complete the blood thinner medication as prescribed.  PRECAUTIONS:  If you experience chest pain or shortness of breath - call 911 immediately for transfer to the hospital emergency department.   If you develop a fever greater that 101 F, purulent drainage from wound, increased redness or drainage from wound, foul odor from the wound/dressing, or calf pain - CONTACT YOUR SURGEON.                                                   FOLLOW-UP APPOINTMENTS:  If you do not already have a post-op appointment, please call the office for an appointment to be seen by your surgeon.  Guidelines for how soon to be seen are listed in your After Visit Summary, but are typically between 1-4 weeks after surgery.  OTHER INSTRUCTIONS:   Knee  Replacement:  Do not place pillow under knee, focus on keeping the knee straight while resting. CPM instructions: 0-90 degrees, 2 hours in the morning, 2 hours in the afternoon, and 2 hours in the evening. Place foam block, curve side up under heel at all times except when in CPM or when walking.  DO NOT modify, tear, cut, or change the foam block in any way.  MAKE SURE YOU:   Understand these instructions.   Get help right away if you are not doing well or get worse.    Thank you for letting us be a part of your medical care team.  It is a privilege we respect greatly.  We hope these instructions will help you stay on track for a fast and full recovery!   Information on my medicine - ELIQUIS (apixaban)  This medication education was reviewed with me or my healthcare representative as part of my discharge preparation.    Why was Eliquis prescribed for you? Eliquis was prescribed for you to reduce the risk of a blood clot forming that can cause a stroke if you have a medical condition called atrial fibrillation (a type of irregular heartbeat).  What do You need to know about Eliquis ? Take your Eliquis TWICE DAILY - one tablet in the morning and one tablet in the evening with or without food. If you have difficulty swallowing the tablet whole please discuss with your pharmacist how to take the medication safely.  Take Eliquis exactly as prescribed by your doctor and DO NOT stop taking Eliquis without talking to the doctor who prescribed the medication.  Stopping may increase your risk of developing a stroke.  Refill your prescription before you run out.  After discharge, you should have regular check-up appointments with your healthcare provider that is prescribing your Eliquis.  In the future your dose may need to be changed if your kidney function or weight changes by a significant amount or as you get older.  What do you do if you miss a dose? If you  miss a dose, take it as soon  as you remember on the same day and resume taking twice daily.  Do not take more than one dose of ELIQUIS at the same time to make up a missed dose.  Important Safety Information A possible side effect of Eliquis is bleeding. You should call your healthcare provider right away if you experience any of the following: ? Bleeding from an injury or your nose that does not stop. ? Unusual colored urine (red or dark brown) or unusual colored stools (red or black). ? Unusual bruising for unknown reasons. ? A serious fall or if you hit your head (even if there is no bleeding).  Some medicines may interact with Eliquis and might increase your risk of bleeding or clotting while on Eliquis. To help avoid this, consult your healthcare provider or pharmacist prior to using any new prescription or non-prescription medications, including herbals, vitamins, non-steroidal anti-inflammatory drugs (NSAIDs) and supplements.  This website has more information on Eliquis (apixaban): http://[email or web address].com/eliquis/home

## 2019-08-17 LAB — GLUCOSE, CAPILLARY: Glucose-Capillary: 123 mg/dL — ABNORMAL HIGH (ref 70–99)

## 2019-08-17 MED ORDER — OXYCODONE HCL 5 MG PO TABS: 5.0000 mg | ORAL_TABLET | ORAL | 0 refills | Status: DC | PRN

## 2019-08-17 MED ORDER — METHOCARBAMOL 500 MG PO TABS: 500.0000 mg | ORAL_TABLET | Freq: Four times a day (QID) | ORAL | 1 refills | Status: DC | PRN

## 2019-08-17 NOTE — Progress Notes (Signed)
Physical Therapy Treatment & Discharge Patient Details Name: Ryan [name] MRN: [id number] DOB: 1941-03-02 Today's Date: 08/17/2019    History of Present Illness Pt is a 78 y.o. male admitted 08/15/19 for elective R THA (direct anterior). PMH includes L THA (2015), afib, PE, PVD, HTN, CHF, CAD, arthritis, cataracts.   PT Comments    Pt has progressed well with mobility. Able to ambulate with RW and ascend/descend steps with rail support at supervision-level. Reviewed educ re: precautions, therex, fall risk reduction, importance of mobility, return to activity. Pt preparing for d/c home this afternoon. Will have necessary DME and family support upon return home. Has met short-term acute PT goals.     Follow Up Recommendations  Follow surgeon's recommendation for DC plan and follow-up therapies;Supervision for mobility/OOB;Home health PT     Equipment Recommendations  None recommended by PT    Recommendations for Other Services       Precautions / Restrictions Precautions Precautions: Fall Restrictions Weight Bearing Restrictions: Yes RLE Weight Bearing: Weight bearing as tolerated    Mobility  Bed Mobility Overal bed mobility: Modified Independent Bed Mobility: Supine to Sit     Supine to sit: Min assist     General bed mobility comments: Bed flat, increased time and effort, but improved ability to come to sitting on L-side of bed; no physical assist required  Transfers Overall transfer level: Needs assistance Equipment used: Rolling walker (2 wheeled) Transfers: Sit to/from Stand Sit to Stand: Supervision            Ambulation/Gait Ambulation/Gait assistance: Supervision Gait Distance (Feet): 200 Feet Assistive device: Rolling walker (2 wheeled) Gait Pattern/deviations: Step-through pattern;Decreased stride length;Antalgic Gait velocity: Decreased Gait velocity interpretation: <1.8 ft/sec, indicate of risk for recurrent falls General Gait Details: Slow,  steady gait with RW at [place]: Yes Stairs assistance: Supervision Stair Management: One rail Right;Step to pattern;Sideways Number of Stairs: 3 General stair comments: Ascend/descended 3 steps with BUE support on R-side rail; educ on technique; supervision for safety   Wheelchair Mobility    Modified Rankin (Stroke Patients Only)       Balance Overall balance assessment: Needs assistance Sitting-balance support: No upper extremity supported;Feet supported Sitting balance-Leahy Scale: Fair       Standing balance-Leahy Scale: Poor Standing balance comment: Reliant on UE support                            Cognition Arousal/Alertness: Awake/alert Behavior During Therapy: WFL for tasks assessed/performed Overall Cognitive Status: Impaired/Different from baseline Area of Impairment: Problem solving;Attention;Memory                   Current Attention Level: Selective Memory: Decreased short-term memory       Problem Solving: Difficulty sequencing;Requires verbal cues General Comments: Likely baseline cognition      Exercises Total Joint Exercises Ankle Circles/Pumps: AROM;Both;Seated Heel Slides: AROM;Right;Seated Long Arc Quad: AROM;Right;Seated Knee Flexion: AROM;Right;Seated    General Comments        Pertinent Vitals/Pain Pain Assessment: Faces Faces Pain Scale: Hurts a little bit Pain Location: R hip Pain Descriptors / Indicators: Guarding;Sore Pain Intervention(s): Monitored during session    Home Living                      Prior Function            PT Goals (current goals can now be found in the  care plan section) Progress towards PT goals: Goals met/education completed, patient discharged from PT    Frequency    7X/week      PT Plan Current plan remains appropriate    Co-evaluation              AM-PAC PT "6 Clicks" Mobility   Outcome Measure  Help needed turning from  your back to your side while in a flat bed without using bedrails?: None Help needed moving from lying on your back to sitting on the side of a flat bed without using bedrails?: None Help needed moving to and from a bed to a chair (including a wheelchair)?: A Little Help needed standing up from a chair using your arms (e.g., wheelchair or bedside chair)?: A Little Help needed to walk in hospital room?: A Little Help needed climbing 3-5 steps with a railing? : A Little 6 Click Score: 20    End of Session Equipment Utilized During Treatment: Gait belt Activity Tolerance: Patient tolerated treatment well Patient left: in chair;with call bell/phone within reach Nurse Communication: Mobility status PT Visit Diagnosis: Other abnormalities of gait and mobility (R26.89);Muscle weakness (generalized) (M62.81);Pain Pain - Right/Left: Right Pain - part of body: Hip     Time: 1155-2080 PT Time Calculation (min) (ACUTE ONLY): 13 min  Charges:  $Gait Training: 8-22 mins                    Mabeline Caras, PT, DPT Acute Rehabilitation Services  Pager [phone] Office [place] 08/17/2019, 12:50 PM

## 2019-08-17 NOTE — Progress Notes (Signed)
Subjective: 2 Days Post-Op Procedure(s) (LRB): RIGHT TOTAL HIP ARTHROPLASTY ANTERIOR APPROACH (Right) Patient reports pain as mild.   Wanting to discharge to home after PT this afternoon.  Objective: Vital signs in last 24 hours: Temp:  [97.7 F (36.5 C)-98.6 F (37 C)] 97.7 F (36.5 C) (08/27 0809) Pulse Rate:  [71-106] 106 (08/27 0809) Resp:  [15-16] 16 (08/27 0809) BP: (98-139)/(51-67) 139/67 (08/27 1022) SpO2:  [91 %-95 %] 95 % (08/27 0809)  Intake/Output from previous day: 08/26 0701 - 08/27 0700 In: 360 [P.O.:360] Out: 600 [Urine:600] Intake/Output this shift: Total I/O In: 120 [P.O.:120] Out: -   Recent Labs    08/16/19 0418  HGB 12.2*   Recent Labs    08/16/19 0418  WBC 11.6*  RBC 3.83*  HCT 37.9*  PLT 230   Recent Labs    08/16/19 0418  NA 138  K 4.3  CL 104  CO2 24  BUN 16  CREATININE 1.16  GLUCOSE 140*  CALCIUM 8.3*   Recent Labs    08/15/19 1028  INR 1.2   Right lower extremity:  Dorsiflexion/Plantar flexion intact Incision: dressing C/D/I Compartment soft   Assessment/Plan: 2 Days Post-Op Procedure(s) (LRB): RIGHT TOTAL HIP ARTHROPLASTY ANTERIOR APPROACH (Right) Up with therapy if patient does well with PT and remains stable then discharge to home today.        Ryan [name] 08/17/2019, 11:34 AM

## 2019-08-17 NOTE — Progress Notes (Signed)
Physical Therapy Treatment Patient Details Name: Ryan [name] MRN: [id number] DOB: 06/17/1941 Today's Date: 08/17/2019    History of Present Illness Pt is a 78 y.o. male admitted 08/15/19 for elective R THA (direct anterior). PMH includes L THA (2015), afib, PE, PVD, HTN, CHF, CAD, arthritis, cataracts.   PT Comments    Pt progressing well with mobility. Ambulating 180' with RW and intermittent min guard for balance, completed seated therex. Pt reports improved pain and no c/o dizziness; post-ambulation BP 139/67. Will plan for additional session for stair training today; pt hopeful for d/c home this afternoon.   Follow Up Recommendations  Follow surgeon's recommendation for DC plan and follow-up therapies;Supervision for mobility/OOB;Home health PT     Equipment Recommendations  None recommended by PT    Recommendations for Other Services       Precautions / Restrictions Precautions Precautions: Fall Restrictions Weight Bearing Restrictions: Yes RLE Weight Bearing: Weight bearing as tolerated    Mobility  Bed Mobility Overal bed mobility: Needs Assistance Bed Mobility: Supine to Sit     Supine to sit: Min assist     General bed mobility comments: MinA for RLE management  Transfers Overall transfer level: Needs assistance Equipment used: Rolling walker (2 wheeled) Transfers: Sit to/from Stand Sit to Stand: Min guard            Ambulation/Gait Ambulation/Gait assistance: Min guard Gait Distance (Feet): 180 Feet Assistive device: Rolling walker (2 wheeled) Gait Pattern/deviations: Step-through pattern;Decreased stride length;Antalgic Gait velocity: Decreased Gait velocity interpretation: <1.8 ft/sec, indicate of risk for recurrent falls General Gait Details: Slow, mostly steady gait with RW and intermittent min guard for balance. Pt reports fatigue towards end of walk; no c/o dizziness. Post-ambulation BP 139/67   Stairs             Wheelchair  Mobility    Modified Rankin (Stroke Patients Only)       Balance Overall balance assessment: Needs assistance Sitting-balance support: No upper extremity supported;Feet supported Sitting balance-Leahy Scale: Fair       Standing balance-Leahy Scale: Poor Standing balance comment: Reliant on UE support                            Cognition Arousal/Alertness: Awake/alert Behavior During Therapy: WFL for tasks assessed/performed Overall Cognitive Status: Impaired/Different from baseline Area of Impairment: Problem solving;Attention;Memory                   Current Attention Level: Selective Memory: Decreased short-term memory       Problem Solving: Difficulty sequencing;Requires verbal cues General Comments: Likely baseline cognition      Exercises Total Joint Exercises Ankle Circles/Pumps: AROM;Both;Seated Heel Slides: AROM;Right;Seated Long Arc Quad: AROM;Right;Seated Knee Flexion: AROM;Right;Seated    General Comments        Pertinent Vitals/Pain Pain Assessment: Faces Faces Pain Scale: Hurts a little bit Pain Location: R hip Pain Descriptors / Indicators: Guarding;Sore Pain Intervention(s): Monitored during session    Home Living                      Prior Function            PT Goals (current goals can now be found in the care plan section) Progress towards PT goals: Progressing toward goals    Frequency    7X/week      PT Plan Current plan remains appropriate    Co-evaluation  AM-PAC PT "6 Clicks" Mobility   Outcome Measure  Help needed turning from your back to your side while in a flat bed without using bedrails?: A Little Help needed moving from lying on your back to sitting on the side of a flat bed without using bedrails?: A Little Help needed moving to and from a bed to a chair (including a wheelchair)?: A Little Help needed standing up from a chair using your arms (e.g., wheelchair or  bedside chair)?: A Little Help needed to walk in hospital room?: A Little Help needed climbing 3-5 steps with a railing? : A Little 6 Click Score: 18    End of Session Equipment Utilized During Treatment: Gait belt Activity Tolerance: Patient tolerated treatment well Patient left: in chair;with call bell/phone within reach Nurse Communication: Mobility status PT Visit Diagnosis: Other abnormalities of gait and mobility (R26.89);Muscle weakness (generalized) (M62.81);Pain Pain - Right/Left: Right Pain - part of body: Hip     Time: 0752-0817 PT Time Calculation (min) (ACUTE ONLY): 25 min  Charges:  $Gait Training: 8-22 mins $Therapeutic Exercise: 8-22 mins                    Mabeline Caras, PT, DPT Acute Rehabilitation Services  Pager [phone] Office [place] 08/17/2019, 9:03 AM

## 2019-08-17 NOTE — Progress Notes (Signed)
Discharge packet printed and provided to the patient.  Discharge instructions reviewed with the patient.    The patient will discharge after a 2nd PT session.  The patient will be discharged to home with his wife.  He reports that the necessary equipment will be delivered to his home

## 2019-08-17 NOTE — Plan of Care (Signed)
°  Problem: Elimination: °Goal: Will not experience complications related to bowel motility °Outcome: Progressing °  °Problem: Pain Managment: °Goal: General experience of comfort will improve °Outcome: Progressing °  °

## 2019-08-17 NOTE — Discharge Summary (Signed)
Patient ID: Ryan [name] MRN: [id number] DOB/AGE: January 28, 1941 78 y.o.  Admit date: 08/15/2019 Discharge date: 08/17/2019  Admission Diagnoses:  Principal Problem:   Unilateral primary osteoarthritis, right hip Active Problems:   Status post THR (total hip replacement)   S/P total hip arthroplasty   Discharge Diagnoses:  Same  Past Medical History:  Diagnosis Date  . Arthritis   . CAD (coronary artery disease)   . Carotid artery occlusion   . Cataract    Bil eyes/worse in left eye  . CHF (congestive heart failure) ([place])   . Chronic back pain   . DVT (deep venous thrombosis) ([place])   . Dysrhythmia   . Enlarged prostate    takes Rapaflo daily  . GERD (gastroesophageal reflux disease)    occasional  . History of colon polyps   . History of gout    has colchicine prn  . History of kidney stones   . Hyperlipidemia    takes Crestor daily  . Hypertension    takes Amlodipine daily  . Hypothyroidism   . Myocardial infarction ([place])   . Peripheral vascular disease ([place])   . Pulmonary emboli ([place]) 03/20/2015   elevated d-dimer, intermediate V/Q study, atypical chest pain and SOB. Start on Xarelto 20mg  BID for 3 month  . Rapid atrial fibrillation ([place])   . Renal insufficiency   . Shortness of breath dyspnea   . Urinary frequency   . Urinary urgency     Surgeries: Procedure(s): RIGHT TOTAL HIP ARTHROPLASTY ANTERIOR APPROACH on 08/15/2019   Consultants:   Discharged Condition: Improved  Hospital Course: Ryan [name] is an 78 y.o. male who was admitted 08/15/2019 for operative treatment ofUnilateral primary osteoarthritis, right hip. Patient has severe unremitting pain that affects sleep, daily activities, and work/hobbies. After pre-op clearance the patient was taken to the operating room on 08/15/2019 and underwent  Procedure(s): RIGHT TOTAL HIP ARTHROPLASTY ANTERIOR APPROACH.    Patient was given perioperative antibiotics:  Anti-infectives (From admission, onward)    Start     Dose/Rate Route Frequency Ordered Stop   08/15/19 1800  ceFAZolin (ANCEF) IVPB 1 g/50 mL premix     1 g 100 mL/hr over 30 Minutes Intravenous Every 6 hours 08/15/19 1555 08/16/19 0036   08/15/19 1000  ceFAZolin (ANCEF) IVPB 2g/100 mL premix     2 g 200 mL/hr over 30 Minutes Intravenous On call to O.R. 08/15/19 0945 08/15/19 1225   08/15/19 0952  ceFAZolin (ANCEF) 2-4 GM/100ML-% IVPB    Note to Pharmacy: Lorne Skeens   : cabinet override      08/15/19 0952 08/15/19 1225       Patient was given sequential compression devices, early ambulation, and chemoprophylaxis to prevent DVT.  Patient benefited maximally from hospital stay and there were no complications.    Recent vital signs:  Patient Vitals for the past 24 hrs:  BP Temp Temp src Pulse Resp SpO2  08/17/19 1022 139/67 - - - - -  08/17/19 0809 139/67 97.7 F (36.5 C) Oral (!) 106 16 95 %  08/17/19 0518 (!) 103/51 98.5 F (36.9 C) Oral 71 16 91 %  08/16/19 2038 (!) 98/52 98.6 F (37 C) Oral 86 15 95 %  08/16/19 1618 (!) 100/52 98.2 F (36.8 C) Oral 92 16 94 %     Recent laboratory studies:  Recent Labs    08/15/19 1028 08/16/19 0418  WBC  --  11.6*  HGB  --  12.2*  HCT  --  37.9*  PLT  --  230  NA  --  138  K  --  4.3  CL  --  104  CO2  --  24  BUN  --  16  CREATININE  --  1.16  GLUCOSE  --  140*  INR 1.2  --   CALCIUM  --  8.3*     Discharge Medications:   Allergies as of 08/17/2019      Reactions   Zetia [ezetimibe] Other (See Comments)   Myalgias    Codeine Nausea And Vomiting       Unithroid [levothyroxine Sodium] Other (See Comments)   Caused blurry vision per pt      Medication List    STOP taking these medications   HYDROcodone-acetaminophen 5-325 MG tablet Commonly known as: NORCO/VICODIN   traMADol 50 MG tablet Commonly known as: ULTRAM     TAKE these medications   amiodarone 100 MG tablet Commonly known as: PACERONE Take 1 tablet (100 mg total) by mouth daily.    amLODipine 5 MG tablet Commonly known as: NORVASC Take 1 tablet (5 mg total) by mouth daily.   bisoprolol 5 MG tablet Commonly known as: ZEBETA Take 0.5 tablets (2.5 mg total) by mouth daily.   Eliquis 5 MG Tabs tablet Generic drug: apixaban TAKE ONE TABLET BY MOUTH TWICE A DAY   furosemide 20 MG tablet Commonly known as: LASIX Take 1 tablet (20 mg total) by mouth daily.   isosorbide mononitrate 60 MG 24 hr tablet Commonly known as: IMDUR Take 1 tablet (60 mg total) by mouth daily.   levothyroxine 50 MCG tablet Commonly known as: SYNTHROID Take 50 mcg by mouth daily before breakfast.   methocarbamol 500 MG tablet Commonly known as: ROBAXIN Take 1 tablet (500 mg total) by mouth every 6 (six) hours as needed for muscle spasms.   nitroGLYCERIN 0.4 MG SL tablet Commonly known as: NITROSTAT Place 1 tablet (0.4 mg total) under the tongue every 5 (five) minutes as needed for chest pain (CP or SOB).   oxyCODONE 5 MG immediate release tablet Commonly known as: Oxy IR/ROXICODONE Take 1-2 tablets (5-10 mg total) by mouth every 4 (four) hours as needed for moderate pain (pain score 4-6).   potassium chloride SA 20 MEQ tablet Commonly known as: K-DUR TAKE TWO TABLETS BY MOUTH DAILY   Repatha SureClick [id number] MG/ML Soaj Generic drug: Evolocumab Inject 1 pen into the skin every 14 (fourteen) days.   senna 8.6 MG Tabs tablet Commonly known as: SENOKOT Take 1 tablet (8.6 mg total) by mouth daily.   tamsulosin 0.4 MG Caps capsule Commonly known as: FLOMAX Take 0.4 mg by mouth at bedtime.            Durable Medical Equipment  (From admission, onward)         Start     Ordered   08/15/19 1556  DME 3 n 1  Once     08/15/19 1555   08/15/19 1556  DME Walker rolling  Once    Question:  Patient needs a walker to treat with the following condition  Answer:  Status post total replacement of right hip   08/15/19 1555          Diagnostic Studies: Dg Pelvis  Portable  Result Date: 08/15/2019 CLINICAL DATA:  Right hip replacement. EXAM: PORTABLE PELVIS 1-2 VIEWS COMPARISON:  No recent prior. FINDINGS: Total right hip replacement. Hardware intact. Anatomic alignment. Prior total left hip replacement. No acute bony abnormality.  IMPRESSION: Total right hip replacement with anatomic alignment. Electronically Signed   By: Marcello Moores  Register   On: 08/15/2019 15:37   Dg C-arm 1-60 Min  Result Date: 08/15/2019 CLINICAL DATA:  Right hip replacement. EXAM: OPERATIVE right HIP (WITH PELVIS IF PERFORMED) 2 VIEWS TECHNIQUE: Fluoroscopic spot image(s) were submitted for interpretation post-operatively. FLUOROSCOPY TIME:  22 seconds. COMPARISON:  Radiographs of June 20, 2019. FINDINGS: Two intraoperative fluoroscopic images demonstrate the right acetabular and femoral components to be well situated. Expected postoperative changes are noted in the surrounding soft tissues. IMPRESSION: Fluoroscopic guidance provided during right total hip arthroplasty. Electronically Signed   By: Marijo Conception M.D.   On: 08/15/2019 15:05   Dg Hip Operative Unilat W Or W/o Pelvis Right  Result Date: 08/15/2019 CLINICAL DATA:  Right hip replacement. EXAM: OPERATIVE right HIP (WITH PELVIS IF PERFORMED) 2 VIEWS TECHNIQUE: Fluoroscopic spot image(s) were submitted for interpretation post-operatively. FLUOROSCOPY TIME:  22 seconds. COMPARISON:  Radiographs of June 20, 2019. FINDINGS: Two intraoperative fluoroscopic images demonstrate the right acetabular and femoral components to be well situated. Expected postoperative changes are noted in the surrounding soft tissues. IMPRESSION: Fluoroscopic guidance provided during right total hip arthroplasty. Electronically Signed   By: Marijo Conception M.D.   On: 08/15/2019 15:05    Disposition: Discharge disposition: 01-Home or Self Care         Follow-up Information    Mcarthur Rossetti, MD. Go on 08/29/2019.   Specialty: Orthopedic  Surgery Why: at 9:30 am for your 2 week post-op with Dr. Ninfa Linden. Contact information: [place] [address] [phone]        Home[facility] Follow up.   Specialty: Home Health Services Why: you have been authorized for 5 home health Physical Therapy visits. Someone from the home health agency will be in contact after discharge from the hospital. Contact information: [address] [place] [address] [phone]            Signed: Erskine Emery 08/17/2019, 11:36 AM

## 2019-08-17 NOTE — TOC Transition Note (Signed)
Transition of Care [facility]) - CM/SW Discharge Note   Patient Details  Name: Ryan [name] MRN: [id number] Date of Birth: 05/10/1941  [facility] CM/SW Contact:  Carles Collet, RN Phone Number: 08/17/2019, 10:17 AM   Clinical Narrative:   Notified [facility] of DC. [place] set up through office, patient is a bundle. Patient states he needs 3/1, has RW. Spoke w Barbera Setters at office who states to use [place] w [place]. [phone].  LM w Ruby Cola for 3/1 to be delivered to house. No other CM needs    Final next level of care: Home w Home Health Services Barriers to Discharge: No Barriers Identified   Patient Goals and CMS Choice Patient states their goals for this hospitalization and ongoing recovery are:: to go home CMS Medicare.gov Compare Post Acute Care list provided to:: (pre op bundle set up by office)    Discharge Placement                       Discharge Plan and Services   Discharge Planning Services: CM Consult            DME Arranged: 3-N-1 DME Agency: [place] Date DME Agency Contacted: 08/17/19 Time DME Agency Contacted: [id number] Representative spoke with at DME Agency: [place]: PT [place]: [facility] ([facility])        Social Determinants of Health ([place]) Interventions     Readmission Risk Interventions No flowsheet data found.

## 2019-08-21 ENCOUNTER — Telehealth: Payer: Self-pay | Admitting: *Deleted

## 2019-08-21 NOTE — Care Plan (Signed)
Attempted discharge call to patient on Friday, 08/18/19, but phone call would not go through after several tries. Also attempted wife's cell number and left VM. Received VM from wife later in the day noting that they were contacted by therapy to schedule on Saturday, but he would like for them to wait until Monday. RNCM called patient on Monday, 08/21/19 and did speak with patient. He noted he was feeling much better today than he did over the weekend. Reminded of importance of beginning therapy ASAP after discharge home. He states he will be willing for them to come tomorrow. Pain is ok today and he is using a cane in the home. Reminded of appointment with Dr. Ninfa Linden on 08/29/19 for 2 week f/u.

## 2019-08-21 NOTE — Telephone Encounter (Signed)
Ortho bundle discharge call completed. 

## 2019-08-22 DIAGNOSIS — M109 Gout, unspecified: Secondary | ICD-10-CM | POA: Diagnosis not present

## 2019-08-22 DIAGNOSIS — N183 Chronic kidney disease, stage 3 (moderate): Secondary | ICD-10-CM | POA: Diagnosis not present

## 2019-08-22 DIAGNOSIS — E039 Hypothyroidism, unspecified: Secondary | ICD-10-CM | POA: Diagnosis not present

## 2019-08-22 DIAGNOSIS — Z96643 Presence of artificial hip joint, bilateral: Secondary | ICD-10-CM | POA: Diagnosis not present

## 2019-08-22 DIAGNOSIS — R3915 Urgency of urination: Secondary | ICD-10-CM | POA: Diagnosis not present

## 2019-08-22 DIAGNOSIS — Z7901 Long term (current) use of anticoagulants: Secondary | ICD-10-CM | POA: Diagnosis not present

## 2019-08-22 DIAGNOSIS — K219 Gastro-esophageal reflux disease without esophagitis: Secondary | ICD-10-CM | POA: Diagnosis not present

## 2019-08-22 DIAGNOSIS — Z86010 Personal history of colonic polyps: Secondary | ICD-10-CM | POA: Diagnosis not present

## 2019-08-22 DIAGNOSIS — Z9181 History of falling: Secondary | ICD-10-CM | POA: Diagnosis not present

## 2019-08-22 DIAGNOSIS — I252 Old myocardial infarction: Secondary | ICD-10-CM | POA: Diagnosis not present

## 2019-08-22 DIAGNOSIS — R350 Frequency of micturition: Secondary | ICD-10-CM | POA: Diagnosis not present

## 2019-08-22 DIAGNOSIS — N401 Enlarged prostate with lower urinary tract symptoms: Secondary | ICD-10-CM | POA: Diagnosis not present

## 2019-08-22 DIAGNOSIS — Z86711 Personal history of pulmonary embolism: Secondary | ICD-10-CM | POA: Diagnosis not present

## 2019-08-22 DIAGNOSIS — Z86718 Personal history of other venous thrombosis and embolism: Secondary | ICD-10-CM | POA: Diagnosis not present

## 2019-08-22 DIAGNOSIS — Z471 Aftercare following joint replacement surgery: Secondary | ICD-10-CM | POA: Diagnosis not present

## 2019-08-22 DIAGNOSIS — I5042 Chronic combined systolic (congestive) and diastolic (congestive) heart failure: Secondary | ICD-10-CM | POA: Diagnosis not present

## 2019-08-22 DIAGNOSIS — M48061 Spinal stenosis, lumbar region without neurogenic claudication: Secondary | ICD-10-CM | POA: Diagnosis not present

## 2019-08-22 DIAGNOSIS — I25110 Atherosclerotic heart disease of native coronary artery with unstable angina pectoris: Secondary | ICD-10-CM | POA: Diagnosis not present

## 2019-08-22 DIAGNOSIS — E785 Hyperlipidemia, unspecified: Secondary | ICD-10-CM | POA: Diagnosis not present

## 2019-08-22 DIAGNOSIS — I130 Hypertensive heart and chronic kidney disease with heart failure and stage 1 through stage 4 chronic kidney disease, or unspecified chronic kidney disease: Secondary | ICD-10-CM | POA: Diagnosis not present

## 2019-08-22 DIAGNOSIS — I480 Paroxysmal atrial fibrillation: Secondary | ICD-10-CM | POA: Diagnosis not present

## 2019-08-22 DIAGNOSIS — Z87891 Personal history of nicotine dependence: Secondary | ICD-10-CM | POA: Diagnosis not present

## 2019-08-22 DIAGNOSIS — I70219 Atherosclerosis of native arteries of extremities with intermittent claudication, unspecified extremity: Secondary | ICD-10-CM | POA: Diagnosis not present

## 2019-08-23 ENCOUNTER — Telehealth: Payer: Self-pay | Admitting: *Deleted

## 2019-08-23 MED ORDER — OXYCODONE HCL 5 MG PO TABS: 5.0000 mg | ORAL_TABLET | Freq: Four times a day (QID) | ORAL | 0 refills | Status: DC | PRN

## 2019-08-23 NOTE — Telephone Encounter (Signed)
I contacted patient for 1 week check in and he informed he is headed to his [place] for the weekend; only been seen by therapy for 1 visit so far and would like a refill of his pain medication. He states he is currently driving and I informed to maybe wait several more weeks before driving.

## 2019-08-23 NOTE — Telephone Encounter (Signed)
1 week Ortho bundle call completed. 

## 2019-08-23 NOTE — Care Plan (Signed)
RNCM called patient for 1 week post-op status. States that therapy came out for initial evaluation on yesterday, 08/22/19, because he has refused on multiple occasions since being home. [place] and won't be back until Sunday. Did request refill of pain medication. Reports muscle relaxer helped with sleep and able to now sleep in his bed. Explained to hold off driving for now s/p RLE surgery just 1 week ago. He verbalized understanding. Will send message to MD about refill request. Wants therapy to come back next Tuesday. Will update therapy.

## 2019-08-29 ENCOUNTER — Encounter: Payer: Self-pay | Admitting: Orthopaedic Surgery

## 2019-08-29 ENCOUNTER — Ambulatory Visit (INDEPENDENT_AMBULATORY_CARE_PROVIDER_SITE_OTHER): Payer: Medicare Other | Admitting: Orthopaedic Surgery

## 2019-08-29 DIAGNOSIS — Z96641 Presence of right artificial hip joint: Secondary | ICD-10-CM

## 2019-08-29 MED ORDER — OXYCODONE HCL 5 MG PO TABS: 5.0000 mg | ORAL_TABLET | Freq: Four times a day (QID) | ORAL | 0 refills | Status: DC | PRN

## 2019-08-29 MED ORDER — METHOCARBAMOL 500 MG PO TABS: 500.0000 mg | ORAL_TABLET | Freq: Four times a day (QID) | ORAL | 1 refills | Status: DC | PRN

## 2019-08-29 NOTE — Progress Notes (Signed)
HPI: [name] returns today 2 weeks status post right total hip arthroplasty.  He is overall doing well.  He is walking without any assistive device.  Does state he takes Robaxin and oxycodone at night whenever he wakes up between 1 AM and 3 AM hip hurting but otherwise takes no pain medication for medications for muscle spasm.  Physical exam: General well-developed well-nourished male no acute distress mood and affect appropriate  Right hip: Surgical incisions well approximated with a subcu running stitch.  No signs of infection or dehiscence.  Right calf supple nontender.  Dorsiflexion plantarflexion ankle intact.  Good range of motion right hip without significant pain.  Impression: Status post right total hip arthroplasty 08/15/2019  Plan: We will continue work on range of motion strengthening right hip.  New Steri-Strips applied he is able to get the incision wet.  Questions encouraged and answered.  Refill on oxycodone and Robaxin were sent to his pharmacy.  He is on chronic Eliquis.

## 2019-08-30 NOTE — Care Plan (Signed)
RNCM met with patient in office for his scheduled 2 week post-op with Benita Stabile, PA-C for Dr. Ninfa Linden. Patient ambulates without assistive device. Reports he is doing very well overall and only takes muscle relaxer and pain medication at night if he wakes during the night. Refill provided today. Incision healed and well approximated. Reinforced with steri-strips per PA. Noted that patient has only received 1 visit of HHPT due to either going out of town or refusing because he is doing so well. Feel that no further therapy is needed. F/U in 4 weeks with Dr. Ninfa Linden. RNCM updated [facility] lia[facility] regarding no further HHPT needed. Reminded to contact RNCM for any further questions or needs.

## 2019-09-11 ENCOUNTER — Telehealth: Payer: Self-pay

## 2019-09-11 ENCOUNTER — Other Ambulatory Visit: Payer: Self-pay

## 2019-09-11 DIAGNOSIS — E7800 Pure hypercholesterolemia, unspecified: Secondary | ICD-10-CM

## 2019-09-11 NOTE — Telephone Encounter (Signed)
Called and lmomed the pt regarding needing lipid labs for repatha 8 week post 1st dose awaiting callback

## 2019-09-12 DIAGNOSIS — Z23 Encounter for immunization: Secondary | ICD-10-CM | POA: Diagnosis not present

## 2019-09-15 ENCOUNTER — Telehealth: Payer: Self-pay | Admitting: *Deleted

## 2019-09-15 NOTE — Telephone Encounter (Signed)
30 day Ortho bundle call and survey completed. 

## 2019-09-15 NOTE — Care Plan (Signed)
RNCM contacted patient for 30 day Ortho bundle call and survey. Patient verbalized he is doing very well. Some days are better than others. He has a long driveway and walks the length of this. Discussed some days if he does too much, his body will be his guide and he may back off the next day until he feels better. Using voltaren gel as needed for pain/anti-inflammatory. 30 day Patient survey reviewed with patient. Used TOM/THN survey (4 questions) in place of previous 10 question survey in [place]. 1. Prior to surgery I was provided sufficient education regarding my surgery and the bundle program- Patient Answer- Strongly agree 2. I was satisfied with the care I received at the facility where my surgery was performed- Patient answer- strongly agree 3. Following surgery, I received sufficient postoperative care instructions- Patient answer- Strongly agree 4. I would recommend my surgeon and this bundle program to others. Patient answer- Strongly recommend

## 2019-09-27 ENCOUNTER — Ambulatory Visit (INDEPENDENT_AMBULATORY_CARE_PROVIDER_SITE_OTHER): Payer: Medicare Other | Admitting: Orthopaedic Surgery

## 2019-09-27 ENCOUNTER — Encounter: Payer: Self-pay | Admitting: Orthopaedic Surgery

## 2019-09-27 DIAGNOSIS — Z96641 Presence of right artificial hip joint: Secondary | ICD-10-CM

## 2019-09-27 NOTE — Progress Notes (Signed)
The patient is now 6 weeks status post a right total hip arthroplasty through direct anterior approach.  He said he is doing great and has no issues at all other than he did have some pain of the trochanteric area but he points Voltaren gel on this and his pain is subsided.  He is walking without an assistive device and he says he is still slow getting up but he is working on that and doing well overall.  On exam his right hip moves smoothly and it moves the same this is nonoperative left side.  He is pain-free on exam today.  His leg lengths are equal.  At this point he can continue Voltaren gel of the trochanteric area on the right side as needed.  If he has any issues he knows to give [place] a call.  We do not need to see him back now for 6 months.  At that visit would like a standing low AP pelvis and lateral of his right operative hip.

## 2019-10-18 DIAGNOSIS — I5020 Unspecified systolic (congestive) heart failure: Secondary | ICD-10-CM | POA: Diagnosis not present

## 2019-10-18 DIAGNOSIS — I130 Hypertensive heart and chronic kidney disease with heart failure and stage 1 through stage 4 chronic kidney disease, or unspecified chronic kidney disease: Secondary | ICD-10-CM | POA: Diagnosis not present

## 2019-10-18 DIAGNOSIS — E785 Hyperlipidemia, unspecified: Secondary | ICD-10-CM | POA: Diagnosis not present

## 2019-10-18 DIAGNOSIS — I25810 Atherosclerosis of coronary artery bypass graft(s) without angina pectoris: Secondary | ICD-10-CM | POA: Diagnosis not present

## 2019-10-18 DIAGNOSIS — N1831 Chronic kidney disease, stage 3a: Secondary | ICD-10-CM | POA: Diagnosis not present

## 2019-10-18 DIAGNOSIS — D6869 Other thrombophilia: Secondary | ICD-10-CM | POA: Diagnosis not present

## 2019-10-18 DIAGNOSIS — R0609 Other forms of dyspnea: Secondary | ICD-10-CM | POA: Diagnosis not present

## 2019-10-18 DIAGNOSIS — E039 Hypothyroidism, unspecified: Secondary | ICD-10-CM | POA: Diagnosis not present

## 2019-10-18 DIAGNOSIS — R7301 Impaired fasting glucose: Secondary | ICD-10-CM | POA: Diagnosis not present

## 2019-10-18 DIAGNOSIS — I480 Paroxysmal atrial fibrillation: Secondary | ICD-10-CM | POA: Diagnosis not present

## 2019-10-24 ENCOUNTER — Telehealth: Payer: Self-pay

## 2019-10-24 ENCOUNTER — Telehealth: Payer: Self-pay | Admitting: Pharmacist

## 2019-10-24 NOTE — Telephone Encounter (Signed)
Patient wife called office returning call about needing labs. Mentioned patient was going to PCP office tomorrow AM for labs. I have called PCP office and left a message on machine to see if lipids were one of the labs they were drawing or if they could order it.  Patient also changed pharmacies and it appears [place] was not transferred. I have faxed over healthwell card info to [facility] on [address]

## 2019-10-24 NOTE — Telephone Encounter (Signed)
lmomed for lipids labs

## 2019-10-25 ENCOUNTER — Encounter: Payer: Self-pay | Admitting: Cardiovascular Disease

## 2019-10-25 DIAGNOSIS — E7849 Other hyperlipidemia: Secondary | ICD-10-CM | POA: Diagnosis not present

## 2019-10-25 DIAGNOSIS — R7301 Impaired fasting glucose: Secondary | ICD-10-CM | POA: Diagnosis not present

## 2019-10-25 NOTE — Telephone Encounter (Signed)
Spoke with Ryan [name] at [facility], she confirmed they can add on lipid panel to patient's other labs being drawn this AM. Will await results.

## 2019-10-27 NOTE — Telephone Encounter (Signed)
Called and left message on [place] VM requesting they fax lipid panel results to [phone]

## 2019-10-31 NOTE — Telephone Encounter (Signed)
Labs from 10/27/2019 @ [facility] LDL -79 TC 144 TG-102 HDL -45

## 2019-11-15 ENCOUNTER — Telehealth: Payer: Self-pay | Admitting: *Deleted

## 2019-11-15 NOTE — Care Plan (Signed)
RNCM call to patient to check status at 90 days post-op for a Right total hip arthroplasty per Dr. Ninfa Linden on 08/15/19. He states overall he is doing ok, but still having pain in both hips that he rates as Extreme with walking. States that it is in both hips and having difficulty with walking or climbing stairs. Pain stops when not ambulating. Suggested if pain does not improve to make an appointment with Dr. Ninfa Linden to discuss this. Patient states he would like to go ahead and schedule something. Appointment scheduled for Monday, 11/20/19 at 9:30 am with Dr. Ninfa Linden. Patient verbalized agreement of date/time.90 day survey competed.

## 2019-11-15 NOTE — Telephone Encounter (Signed)
90 day Ortho bundle call and survey completed.

## 2019-11-20 ENCOUNTER — Ambulatory Visit: Payer: Medicare Other | Admitting: Orthopaedic Surgery

## 2019-12-08 ENCOUNTER — Other Ambulatory Visit: Payer: Self-pay

## 2019-12-08 MED ORDER — POTASSIUM CHLORIDE CRYS ER 20 MEQ PO TBCR: 40.0000 meq | EXTENDED_RELEASE_TABLET | Freq: Every day | ORAL | 0 refills | Status: DC

## 2020-01-18 ENCOUNTER — Encounter: Payer: Self-pay | Admitting: Cardiovascular Disease

## 2020-01-18 ENCOUNTER — Other Ambulatory Visit: Payer: Self-pay

## 2020-01-18 ENCOUNTER — Ambulatory Visit (INDEPENDENT_AMBULATORY_CARE_PROVIDER_SITE_OTHER): Payer: Medicare Other | Admitting: Cardiovascular Disease

## 2020-01-18 ENCOUNTER — Telehealth: Payer: Self-pay | Admitting: Cardiovascular Disease

## 2020-01-18 VITALS — BP 124/65 | HR 74 | Ht 66.0 in | Wt 169.0 lb

## 2020-01-18 DIAGNOSIS — E785 Hyperlipidemia, unspecified: Secondary | ICD-10-CM

## 2020-01-18 DIAGNOSIS — I5042 Chronic combined systolic (congestive) and diastolic (congestive) heart failure: Secondary | ICD-10-CM

## 2020-01-18 DIAGNOSIS — I480 Paroxysmal atrial fibrillation: Secondary | ICD-10-CM

## 2020-01-18 DIAGNOSIS — I2583 Coronary atherosclerosis due to lipid rich plaque: Secondary | ICD-10-CM

## 2020-01-18 DIAGNOSIS — I2510 Atherosclerotic heart disease of native coronary artery without angina pectoris: Secondary | ICD-10-CM

## 2020-01-18 DIAGNOSIS — I10 Essential (primary) hypertension: Secondary | ICD-10-CM | POA: Diagnosis not present

## 2020-01-18 MED ORDER — ISOSORBIDE MONONITRATE ER 30 MG PO TB24: 30.0000 mg | ORAL_TABLET | Freq: Every day | ORAL | 3 refills | Status: DC

## 2020-01-18 NOTE — Telephone Encounter (Signed)
Have noted this information in patient's appointment notes

## 2020-01-18 NOTE — Patient Instructions (Signed)
Medication Instructions:  Your physician has recommended you make the following change in your medication:  DECREASE Isosorbide mononitrate (Imdur) to 30 mg once daily  *If you need a refill on your cardiac medications before your next appointment, please call your pharmacy*  Lab Work: None Ordered  If you have labs (blood work) drawn today and your tests are completely normal, you will receive your results only by: Marland Kitchen MyChart Message (if you have MyChart) OR . A paper copy in the mail If you have any lab test that is abnormal or we need to change your treatment, we will call you to review the results.   Testing/Procedures: None Ordered   Follow-Up: At [facility], you and your health needs are our priority.  As part of our continuing mission to provide you with exceptional heart care, we have created designated Provider Care Teams.  These Care Teams include your primary Cardiologist (physician) and Advanced Practice Providers (APPs -  Physician Assistants and Nurse Practitioners) who all work together to provide you with the care you need, when you need it.  Your next appointment:   6 month(s)  The format for your next appointment:   Either In Person or Virtual  Provider:   [name], PA-C, Robbie Lis, PA-C or Daune Perch, NP

## 2020-01-18 NOTE — Telephone Encounter (Signed)
Patients wife states that she needs to come with her husband to his appt this morning with Dr. Acie Fredrickson to help fill out paperwork.

## 2020-01-18 NOTE — Progress Notes (Signed)
Problem List  1. CAD - CABG  2. Essential Hypertension 3. Hyperlipidemia 4. Left carotid arterectomy  5. Atrial fib  6. Gout    Ryan [name] is a 79 y.o. man  With a history of hyperlipidemia, coronary artery disease, hypothyroidism  He was admitted March 31 with CP and dyspnea.   VQ was intermediate.   Was clinically thought to have a PE  So he was DC'd on NOAC. Was readmitted several days later with STEMI and cath revealed severe CAD. CT angio on April 16 showed no evidence of pulmonary embolus.   Oct. 4, 2016:  Ryan [name] is doing well.  Occasional episodes of orthostatic hypotension Is eating some salty snacks. Has lots of hip pain with walking  Encouraged him to try a stationary bike or elliptical  Had peripheral arterial duplex scan ( ? At [place]) that was reportedly normal.   March 17, 2016  Thought he had some symptoms of food poisoning - occurred after eating .  Also may have developed back issues related to taking the crestor also  He stopped the crestor and the symptoms have resolved.   June 24, 2016:  Ryan [name] is seen today .   Spent the weekend at [facility]. - has a house there.  No CP or dyspnea.      March 10, 2017:  Ryan [name] is seen back after a recent hospitalization for new atrial fib .   Was started on amio and Eliquis  The echo shows an EF of 45%.  Moderate AS   Gets nausea after taking one of his meds.  Is now on Amio 400 BID,  Was also found to have gout  - was started on colchicine - feels better.  Is having some diarrhea from the colchicine   Is back in [facility]   Aug. 14, 2018:  Ryan [name] is seen today for follow up of his CAD and atrial fib. He was hospitalized in April with urinary tract infection/pyelonephritis. He had mildly elevated troponin levels at that time. He also had acute kidney injury with a creatinine of 2.31 on admission. Creatinine has improved to 1.41  Had mildly elevated troponin levels at that time I had talked to Dr. Broadus John  about doing a [place] Call .   Still gets fatigued frequently . Has claudication .  Used to see Dr. Kellie Simmering.     Apr 20, 2018: Ryan [name] was admitted to the hospital in February, 2019 with chest pain. Took 1 sublingual nitroglycerin and 1 hydrocodone which relieved his chest pain.  Troponin levels were mildly elevated at 0.07, 0.08. Stress Myoview revealed a very small area of inferior ischemia. She did well and we decided to treat him medically.  He did not want to have a heart catheterization at that time.  He seems to have mildly elevated troponin levels on a chronic basis.  Has had recurrent CP  - one night he thought he would need to go to the hospital . He has stopped his rosuvastatin  and is taking just 1/2 of a Bisoprolol .  Has had progressive shortness of breath  Now he wants to have a cath   Aug. 1, 2019:  Ryan [name] is seen today for follow up visit .  No recent episodes of CP  Has lots of bruising / bleeding  - due to Eliquis therapy Has not been taking his synthroid for the past several months   January 18, 2020:  Ryan [name] is seen today for follow-up of his coronary artery  disease and atrial fibrillation. Occasional gas pain .  Relieved by passing gas or belching  Breathing is ok  Is now on Repatha  Has dizzy spells .   Symptoms are orthostatic hypotension  Is walking up and down his driveway - seems to be doing well with the exercise    Medications Current Outpatient Medications  Medication Sig Dispense Refill  . amiodarone (PACERONE) 200 MG tablet Take 200 mg by mouth daily.    Marland Kitchen amLODipine (NORVASC) 5 MG tablet Take 1 tablet (5 mg total) by mouth daily. 90 tablet 4  . bisoprolol (ZEBETA) 5 MG tablet Take 0.5 tablets (2.5 mg total) by mouth daily. 90 tablet 4  . ELIQUIS 5 MG TABS tablet TAKE ONE TABLET BY MOUTH TWICE A DAY 60 tablet 6  . Evolocumab (REPATHA SURECLICK) [id number] MG/ML SOAJ Inject 1 pen into the skin every 14 (fourteen) days. 2 pen 11  . furosemide (LASIX) 20  MG tablet Take 1 tablet (20 mg total) by mouth daily. 90 tablet 3  . isosorbide mononitrate (IMDUR) 60 MG 24 hr tablet Take 1 tablet (60 mg total) by mouth daily. 90 tablet 4  . levothyroxine (SYNTHROID) 50 MCG tablet Take 50 mcg by mouth daily before breakfast.     . methocarbamol (ROBAXIN) 500 MG tablet Take 1 tablet (500 mg total) by mouth every 6 (six) hours as needed for muscle spasms. 40 tablet 1  . nitroGLYCERIN (NITROSTAT) 0.4 MG SL tablet Place 1 tablet (0.4 mg total) under the tongue every 5 (five) minutes as needed for chest pain (CP or SOB). 25 tablet 6  . oxyCODONE (OXY IR/ROXICODONE) 5 MG immediate release tablet Take 1-2 tablets (5-10 mg total) by mouth every 6 (six) hours as needed for moderate pain (pain score 4-6). 30 tablet 0  . potassium chloride SA (KLOR-CON) 20 MEQ tablet Take 2 tablets (40 mEq total) by mouth daily. 180 tablet 0  . senna (SENOKOT) 8.6 MG TABS tablet Take 1 tablet (8.6 mg total) by mouth daily. 30 each 0  . tamsulosin (FLOMAX) 0.4 MG CAPS capsule Take 0.4 mg by mouth at bedtime.     No current facility-administered medications for this visit.   Review of systems: Noted in current history, otherwise review of systems is negative.  Physical Exam: Blood pressure 124/65, pulse 74, height 5\' 6"  (1.676 m), weight 169 lb (76.7 kg), SpO2 98 %.  GEN: Elderly male, no acute distress HEENT: Normal NECK: No JVD; No carotid bruits LYMPHATICS: No lymphadenopathy CARDIAC: Irregularly irregular. RESPIRATORY:  Clear to auscultation without rales, wheezing or rhonchi  ABDOMEN: Soft, non-tender, non-distended MUSCULOSKELETAL:  No edema; No deformity  SKIN: Warm and dry NEUROLOGIC:  Alert and oriented x 3    ECG : January 17, 2019 2021: Atrial fibrillation at a rate of 74.  Occasionally aberrantly conducted heartbeat.      Lab Results:  No results for input(s): WBC, HGB, HCT, PLT in the last 72 hours. BMET No results for input(s): NA, K, CL, CO2, GLUCOSE, BUN,  CREATININE, CALCIUM in the last 72 hours.  Assessment/Plan    1. Paroxysmal Atrial fib: He is back in atrial fibrillation.  He is tolerating it very well.  Heart rate is well controlled.  Continue amiodarone 200 mg a day.  2. CAD:   He is not having any episodes of angina.  Will reduce Imdur to 30 mg a day to help prevent his orthostasis.   3. Hyperlipidemia -       continue  current medications.  Labs typically been drawn by [name].  4.  Hypothyroidism:   He is back on his thyroid medicine.   5..  Essential hypertension-blood pressures currently well controlled.  He is having some symptoms of orthostatic hypotension.  We will reduce his isosorbide slightly to 30 mg a day in an effort to help with this orthostasis.    Thayer Headings, Brooke Bonito., MD, [facility] 01/18/2020, 9:50 AM [address],  [place] Pager [phone]

## 2020-01-19 ENCOUNTER — Ambulatory Visit: Payer: Medicare Other

## 2020-01-24 ENCOUNTER — Ambulatory Visit: Payer: Medicare Other

## 2020-01-27 ENCOUNTER — Ambulatory Visit: Payer: Medicare Other | Attending: Internal Medicine

## 2020-01-27 DIAGNOSIS — Z23 Encounter for immunization: Secondary | ICD-10-CM | POA: Insufficient documentation

## 2020-01-27 NOTE — Progress Notes (Signed)
   Covid-19 Vaccination Clinic  Name:  Ryan [name]    MRN: [id number] DOB: [birth year]  01/27/2020  [name] was observed post Covid-19 immunization for 15 minutes without incidence. He was provided with Vaccine Information Sheet and instruction to access the V-Safe system.   [name] was instructed to call 911 with any severe reactions post vaccine: Marland Kitchen Difficulty breathing  . Swelling of your face and throat  . A fast heartbeat  . A bad rash all over your body  . Dizziness and weakness    Immunizations Administered    Name Date Dose VIS Date Route   Pfizer COVID-19 Vaccine 01/27/2020  3:11 PM 0.3 mL 12/01/2019 Intramuscular   Manufacturer: [place]   Lot: [id number]   [place]: [id number]

## 2020-02-15 ENCOUNTER — Other Ambulatory Visit: Payer: Self-pay

## 2020-02-15 MED ORDER — AMIODARONE HCL 200 MG PO TABS: 200.0000 mg | ORAL_TABLET | Freq: Every day | ORAL | 3 refills | Status: DC

## 2020-02-16 DIAGNOSIS — E038 Other specified hypothyroidism: Secondary | ICD-10-CM | POA: Diagnosis not present

## 2020-02-16 DIAGNOSIS — Z125 Encounter for screening for malignant neoplasm of prostate: Secondary | ICD-10-CM | POA: Diagnosis not present

## 2020-02-16 DIAGNOSIS — E7849 Other hyperlipidemia: Secondary | ICD-10-CM | POA: Diagnosis not present

## 2020-02-16 DIAGNOSIS — R7301 Impaired fasting glucose: Secondary | ICD-10-CM | POA: Diagnosis not present

## 2020-02-16 DIAGNOSIS — M109 Gout, unspecified: Secondary | ICD-10-CM | POA: Diagnosis not present

## 2020-02-21 ENCOUNTER — Ambulatory Visit: Payer: Medicare Other | Attending: Internal Medicine

## 2020-02-21 DIAGNOSIS — Z23 Encounter for immunization: Secondary | ICD-10-CM | POA: Insufficient documentation

## 2020-02-21 NOTE — Progress Notes (Signed)
   Covid-19 Vaccination Clinic  Name:  Ryan [name]    MRN: [id number] DOB: 1941-02-02  02/21/2020  [name] was observed post Covid-19 immunization for 15 minutes without incident. He was provided with Vaccine Information Sheet and instruction to access the V-Safe system.   [name] was instructed to call 911 with any severe reactions post vaccine: Marland Kitchen Difficulty breathing  . Swelling of face and throat  . A fast heartbeat  . A bad rash all over body  . Dizziness and weakness   Immunizations Administered    Name Date Dose VIS Date Route   Pfizer COVID-19 Vaccine 02/21/2020 11:08 AM 0.3 mL 12/01/2019 Intramuscular   Manufacturer: [place]   Lot: [id number]   [place]: [id number]

## 2020-02-23 DIAGNOSIS — I739 Peripheral vascular disease, unspecified: Secondary | ICD-10-CM | POA: Diagnosis not present

## 2020-02-23 DIAGNOSIS — I130 Hypertensive heart and chronic kidney disease with heart failure and stage 1 through stage 4 chronic kidney disease, or unspecified chronic kidney disease: Secondary | ICD-10-CM | POA: Diagnosis not present

## 2020-02-23 DIAGNOSIS — Z0000 Encounter for general adult medical examination without abnormal findings: Secondary | ICD-10-CM | POA: Diagnosis not present

## 2020-02-23 DIAGNOSIS — Z1331 Encounter for screening for depression: Secondary | ICD-10-CM | POA: Diagnosis not present

## 2020-02-23 DIAGNOSIS — E039 Hypothyroidism, unspecified: Secondary | ICD-10-CM | POA: Diagnosis not present

## 2020-02-23 DIAGNOSIS — I6529 Occlusion and stenosis of unspecified carotid artery: Secondary | ICD-10-CM | POA: Diagnosis not present

## 2020-02-23 DIAGNOSIS — R7301 Impaired fasting glucose: Secondary | ICD-10-CM | POA: Diagnosis not present

## 2020-02-23 DIAGNOSIS — Z1339 Encounter for screening examination for other mental health and behavioral disorders: Secondary | ICD-10-CM | POA: Diagnosis not present

## 2020-02-23 DIAGNOSIS — E785 Hyperlipidemia, unspecified: Secondary | ICD-10-CM | POA: Diagnosis not present

## 2020-02-23 DIAGNOSIS — D6869 Other thrombophilia: Secondary | ICD-10-CM | POA: Diagnosis not present

## 2020-02-23 DIAGNOSIS — I25810 Atherosclerosis of coronary artery bypass graft(s) without angina pectoris: Secondary | ICD-10-CM | POA: Diagnosis not present

## 2020-02-23 DIAGNOSIS — I480 Paroxysmal atrial fibrillation: Secondary | ICD-10-CM | POA: Diagnosis not present

## 2020-02-23 DIAGNOSIS — I5020 Unspecified systolic (congestive) heart failure: Secondary | ICD-10-CM | POA: Diagnosis not present

## 2020-02-23 DIAGNOSIS — N1831 Chronic kidney disease, stage 3a: Secondary | ICD-10-CM | POA: Diagnosis not present

## 2020-02-29 ENCOUNTER — Ambulatory Visit (INDEPENDENT_AMBULATORY_CARE_PROVIDER_SITE_OTHER): Payer: Medicare Other

## 2020-02-29 ENCOUNTER — Encounter: Payer: Self-pay | Admitting: Orthopaedic Surgery

## 2020-02-29 ENCOUNTER — Ambulatory Visit (INDEPENDENT_AMBULATORY_CARE_PROVIDER_SITE_OTHER): Payer: Medicare Other | Admitting: Orthopaedic Surgery

## 2020-02-29 ENCOUNTER — Other Ambulatory Visit: Payer: Self-pay

## 2020-02-29 DIAGNOSIS — M542 Cervicalgia: Secondary | ICD-10-CM

## 2020-02-29 DIAGNOSIS — M25511 Pain in right shoulder: Secondary | ICD-10-CM | POA: Diagnosis not present

## 2020-02-29 DIAGNOSIS — I2583 Coronary atherosclerosis due to lipid rich plaque: Secondary | ICD-10-CM

## 2020-02-29 DIAGNOSIS — I2510 Atherosclerotic heart disease of native coronary artery without angina pectoris: Secondary | ICD-10-CM

## 2020-02-29 MED ORDER — METHYLPREDNISOLONE 4 MG PO TABS: ORAL_TABLET | ORAL | 0 refills | Status: DC

## 2020-02-29 MED ORDER — ACETAMINOPHEN-CODEINE #3 300-30 MG PO TABS: 1.0000 | ORAL_TABLET | Freq: Three times a day (TID) | ORAL | 0 refills | Status: DC | PRN

## 2020-02-29 NOTE — Progress Notes (Signed)
+  Office Visit Note   Patient: Ryan [name]           Date of Birth: 10-29-1941           MRN: [id number] Visit Date: 02/29/2020              Requested by: Marton Redwood, MD [address] [place],  [place] [address] PCP: Marton Redwood, MD   Assessment & Plan: Visit Diagnoses:  1. Acute pain of right shoulder   2. Neck pain    Plan: Since he is doing well I would recommend just some pain medication such as Tylenol 3 and he agrees with this.  He cannot take anti-inflammatories because he is on blood thinning medication (Eliquis).  I will send in a 6-day steroid taper but to hold off on this until next week since he is about 7 days out from his second COVID-19 vaccine.  All question concerns were answered addressed.  Since he is continuing to improve hopefully that will be the case.  Follow-up can be as needed.  Follow-Up Instructions: Return if symptoms worsen or fail to improve.   Orders:  Orders Placed This Encounter  Procedures  . XR Shoulder Right  . XR Cervical Spine 2 or 3 views   Meds ordered this encounter  Medications  . methylPREDNISolone (MEDROL) 4 MG tablet    Sig: Medrol dose pack. Take as instructed    Dispense:  21 tablet    Refill:  0  . acetaminophen-codeine (TYLENOL #3) 300-30 MG tablet    Sig: Take 1-2 tablets by mouth every 8 (eight) hours as needed for moderate pain.    Dispense:  30 tablet    Refill:  0      Procedures: No procedures performed   Clinical Data: No additional findings.   Subjective: Chief Complaint  Patient presents with  . Neck - Pain  . Right Shoulder - Pain  The patient comes in with mainly neck pain to the right side and some into his shoulder.  He is 79 years old very active.  He states that he may have slept wrong the other night and slept on his stomach.  He woke up with neck pain for the last 4 days.  He points to the right side of his neck at the occipital region down into his axilla as the source of his pain.  He  says his neck does pop in his pocket for years.  He denies any numbness or tingling in his hands.  He said today his pain is significantly better.   HPI  Review of Systems He currently denies any headache, chest pain, shortness of breath, fever, chills, nausea, vomiting  Objective: Vital Signs: There were no vitals taken for this visit.  Physical Exam He is alert and orient x3 and in no acute distress Ortho Exam Examination of the cervical spine shows some popping and cracking from arthritic changes but his range of motion is full.  He has negative Spurling sign bilaterally.  He has excellent strength in his wrist and hands bilaterally with normal grip strength.  He has some limited motion of the left shoulder secondary to a previous shoulder replacement.  He does show weakness in the rotator cuff on the right shoulder. Specialty Comments:  No specialty comments available.  Imaging: XR Cervical Spine 2 or 3 views  Result Date: 02/29/2020 2 views of the cervical spine show no acute findings.  There is arthritic changes at multiple levels but  no significant malalignment.  XR Shoulder Right  Result Date: 02/29/2020 3 views of the right shoulder show a high riding humeral head suggesting rotator cuff disease.  There is otherwise no acute findings in the shoulder is located there is not severe glenohumeral disease.    PMFS History: Patient Active Problem List   Diagnosis Date Noted  . S/P total hip arthroplasty 08/16/2019  . Unilateral primary osteoarthritis, right hip 07/18/2019  . PAF (paroxysmal atrial fibrillation) ([place]) 01/27/2019  . Dizziness 01/27/2019  . History of pneumonia 01/27/2019  . CKD (chronic kidney disease) stage 3, GFR 30-59 ml/min 01/27/2019  . Chest pain 02/07/2018  . Elevated troponin 03/23/2017  . Sigmoid diverticulitis 03/23/2017  . Acute pyelonephritis 03/23/2017  . AKI (acute kidney injury) ([place]) 03/23/2017  . Wrist pain, acute, right   .  Hypothyroidism   . Dyspnea 02/26/2017  . Shoulder blade pain 02/26/2017  . SOB (shortness of breath) 02/26/2017  . Chronic right shoulder pain   . Chronic combined systolic and diastolic CHF (congestive heart failure) ([place])   . HLD (hyperlipidemia) 01/20/2017  . Chronic cholecystitis 07/27/2016  . PAD (peripheral artery disease) ([place]) 07/09/2015  . S/P CABG x 4 04/05/2015  . Coronary artery disease involving native coronary artery with unstable angina pectoris ([place])   . Atypical chest pain 03/20/2015  . Carotid artery disease ([place]) 10/09/2014  . PVD (peripheral vascular disease) ([facility]) 10/09/2014  . HTN (hypertension) 05/30/2014  . Arthritis of left hip 01/09/2014  . Status post THR (total hip replacement) 01/09/2014  . Spinal stenosis, lumbar 01/06/2013    Class: Diagnosis of  . Coronary artery disease 01/05/2013  . Atherosclerosis of native artery of extremity with intermittent claudication ([place]) 10/18/2012   Past Medical History:  Diagnosis Date  . Arthritis   . CAD (coronary artery disease)   . Carotid artery occlusion   . Cataract    Bil eyes/worse in left eye  . CHF (congestive heart failure) ([place])   . Chronic back pain   . DVT (deep venous thrombosis) ([place])   . Dysrhythmia   . Enlarged prostate    takes Rapaflo daily  . GERD (gastroesophageal reflux disease)    occasional  . History of colon polyps   . History of gout    has colchicine prn  . History of kidney stones   . Hyperlipidemia    takes Crestor daily  . Hypertension    takes Amlodipine daily  . Hypothyroidism   . Myocardial infarction ([place])   . Peripheral vascular disease ([place])   . Pulmonary emboli ([place]) 03/20/2015   elevated d-dimer, intermediate V/Q study, atypical chest pain and SOB. Start on Xarelto 20mg  BID for 3 month  . Rapid atrial fibrillation ([place])   . Renal insufficiency   . Shortness of breath dyspnea   . Urinary frequency   . Urinary urgency     Family History  Problem Relation Age of  Onset  . Heart disease Father   . Heart attack Father   . Heart disease Sister   . Hypertension Sister   . Heart attack Sister   . Hypertension Mother   . Diabetes Son     Past Surgical History:  Procedure Laterality Date  . APPENDECTOMY    . BACK SURGERY     5 times  . big toe surgery    . CARDIAC CATHETERIZATION     2010    dr Acie Fredrickson  . cataract surgery     left eye  .  CHOLECYSTECTOMY N/A 07/27/2016   Procedure: LAPAROSCOPIC CHOLECYSTECTOMY;  Surgeon: Mickeal Skinner, MD;  Location: [facility];  Service: General;  Laterality: N/A;  . COLONOSCOPY    . CORONARY ARTERY BYPASS GRAFT N/A 04/05/2015   Procedure: CORONARY ARTERY BYPASS GRAFTING (CABG)X4 LIMA-LAD; SVG-DIAG1-DIAG2; SVG-PD;  Surgeon: [name], MD;  Location: [facility];  Service: Open Heart Surgery;  Laterality: N/A;  . CORONARY/GRAFT ANGIOGRAPHY N/A 04/22/2018   Procedure: CORONARY/GRAFT ANGIOGRAPHY;  Surgeon: Martinique, Peter M, MD;  Location: [facility];  Service: Cardiovascular;  Laterality: N/A;  . CYSTOSCOPY    . ENDARTERECTOMY Left 04/24/2016   Procedure: ENDARTERECTOMY LEFT CAROTID;  Surgeon: Mal Misty, MD;  Location: [facility];  Service: Vascular;  Laterality: Left;  . EYE SURGERY    . FEMORAL ARTERY - POPLITEAL ARTERY BYPASS GRAFT    . JOINT REPLACEMENT     shoulder  . LEFT HEART CATHETERIZATION WITH CORONARY ANGIOGRAM N/A 04/03/2015   Procedure: LEFT HEART CATHETERIZATION WITH CORONARY ANGIOGRAM;  Surgeon: Troy Sine, MD;  Location: [facility];  Service: Cardiovascular;  Laterality: N/A;  . LUMBAR LAMINECTOMY  01/06/2013   Procedure: MICRODISCECTOMY LUMBAR LAMINECTOMY;  Surgeon: Marybelle Killings, MD;  Location: [facility];  Service: Orthopedics;  Laterality: N/A;  L3-4 decompression  . LUMBAR LAMINECTOMY/DECOMPRESSION MICRODISCECTOMY  02/12/2012   Procedure: LUMBAR LAMINECTOMY/DECOMPRESSION MICRODISCECTOMY;  Surgeon: Floyce Stakes, MD;  Location: [facility];  Service: Neurosurgery;  Laterality: N/A;   Lumbar four-five laminectomy  . PATCH ANGIOPLASTY Left 04/24/2016   Procedure: LEFT CAROTID ARTERY PATCH ANGIOPLASTY;  Surgeon: Mal Misty, MD;  Location: [facility];  Service: Vascular;  Laterality: Left;  . RIGHT HEART CATH AND CORONARY/GRAFT ANGIOGRAPHY N/A 01/30/2019   Procedure: RIGHT HEART CATH AND CORONARY/GRAFT ANGIOGRAPHY;  Surgeon: Troy Sine, MD;  Location: [facility];  Service: Cardiovascular;  Laterality: N/A;  . STERIOD INJECTION Right 01/09/2014   Procedure: STEROID INJECTION;  Surgeon: Mcarthur Rossetti, MD;  Location: [facility];  Service: Orthopedics;  Laterality: Right;  . TEE WITHOUT CARDIOVERSION N/A 04/05/2015   Procedure: TRANSESOPHAGEAL ECHOCARDIOGRAM (TEE);  Surgeon: [name], MD;  Location: [facility];  Service: Open Heart Surgery;  Laterality: N/A;  . TOTAL HIP ARTHROPLASTY Left 01/09/2014   DR Ninfa [facility]  . TOTAL HIP ARTHROPLASTY Left 01/09/2014   Procedure: LEFT TOTAL HIP ARTHROPLASTY ANTERIOR APPROACH and Steroid Injection Right hip;  Surgeon: Mcarthur Rossetti, MD;  Location: [facility];  Service: Orthopedics;  Laterality: Left;  . TOTAL HIP ARTHROPLASTY Right 08/15/2019  . TOTAL HIP ARTHROPLASTY Right 08/15/2019   Procedure: RIGHT TOTAL HIP ARTHROPLASTY ANTERIOR APPROACH;  Surgeon: Mcarthur Rossetti, MD;  Location: [facility];  Service: Orthopedics;  Laterality: Right;   Social History   Occupational History  . Not on file  Tobacco Use  . Smoking status: Former Smoker    Types: Cigarettes    Quit date: 02/04/1987    Years since quitting: 33.0  . Smokeless tobacco: Former Systems developer    Types: Chew    Quit date: 07/20/2009  . Tobacco comment: quit 35+yrs ago  Substance and Sexual Activity  . Alcohol use: No    Alcohol/week: 0.0 standard drinks  . Drug use: No  . Sexual activity: Not Currently

## 2020-03-14 ENCOUNTER — Other Ambulatory Visit: Payer: Self-pay | Admitting: Cardiology

## 2020-03-15 ENCOUNTER — Other Ambulatory Visit: Payer: Self-pay

## 2020-03-15 MED ORDER — APIXABAN 5 MG PO TABS: 5.0000 mg | ORAL_TABLET | Freq: Two times a day (BID) | ORAL | 5 refills | Status: DC

## 2020-03-15 NOTE — Telephone Encounter (Addendum)
Eliquis 5mg  refill request received, pt is 79yrs old, weight-76.7kg, Crea-1.16 on 08/16/2019, Diagnosis-Afib, and last seen by Dr. Acie Fredrickson on 01/18/2020. Dose is appropriate based on dosing criteria. Will send in refill to requested pharmacy. Pt was updated and was thankful.

## 2020-03-15 NOTE — Telephone Encounter (Signed)
Called pt to let them know their prescription has been sent to the to the coumadin dept for a refill and left VM. Pt would like a call to confirm why or why not their med can be filled.

## 2020-03-16 ENCOUNTER — Other Ambulatory Visit: Payer: Self-pay | Admitting: Cardiology

## 2020-03-26 DIAGNOSIS — R0609 Other forms of dyspnea: Secondary | ICD-10-CM | POA: Diagnosis not present

## 2020-03-26 DIAGNOSIS — M898X1 Other specified disorders of bone, shoulder: Secondary | ICD-10-CM | POA: Diagnosis not present

## 2020-03-26 DIAGNOSIS — I5020 Unspecified systolic (congestive) heart failure: Secondary | ICD-10-CM | POA: Diagnosis not present

## 2020-04-02 ENCOUNTER — Ambulatory Visit: Payer: Medicare Other | Admitting: Orthopaedic Surgery

## 2020-05-10 ENCOUNTER — Other Ambulatory Visit: Payer: Self-pay

## 2020-05-10 MED ORDER — BISOPROLOL FUMARATE 5 MG PO TABS: 2.5000 mg | ORAL_TABLET | Freq: Every day | ORAL | 2 refills | Status: DC

## 2020-05-10 MED ORDER — POTASSIUM CHLORIDE CRYS ER 20 MEQ PO TBCR: 40.0000 meq | EXTENDED_RELEASE_TABLET | Freq: Every day | ORAL | 2 refills | Status: DC

## 2020-05-21 ENCOUNTER — Other Ambulatory Visit: Payer: Self-pay

## 2020-05-21 MED ORDER — NITROGLYCERIN 0.4 MG SL SUBL: 0.4000 mg | SUBLINGUAL_TABLET | SUBLINGUAL | 5 refills | Status: DC | PRN

## 2020-07-03 ENCOUNTER — Ambulatory Visit (INDEPENDENT_AMBULATORY_CARE_PROVIDER_SITE_OTHER): Payer: Medicare Other

## 2020-07-03 ENCOUNTER — Ambulatory Visit (INDEPENDENT_AMBULATORY_CARE_PROVIDER_SITE_OTHER): Payer: Medicare Other | Admitting: Podiatry

## 2020-07-03 ENCOUNTER — Other Ambulatory Visit: Payer: Self-pay

## 2020-07-03 ENCOUNTER — Other Ambulatory Visit: Payer: Self-pay | Admitting: Podiatry

## 2020-07-03 DIAGNOSIS — M779 Enthesopathy, unspecified: Secondary | ICD-10-CM

## 2020-07-03 DIAGNOSIS — I2510 Atherosclerotic heart disease of native coronary artery without angina pectoris: Secondary | ICD-10-CM | POA: Diagnosis not present

## 2020-07-03 DIAGNOSIS — M1000 Idiopathic gout, unspecified site: Secondary | ICD-10-CM | POA: Diagnosis not present

## 2020-07-03 DIAGNOSIS — I2583 Coronary atherosclerosis due to lipid rich plaque: Secondary | ICD-10-CM

## 2020-07-03 DIAGNOSIS — R52 Pain, unspecified: Secondary | ICD-10-CM | POA: Diagnosis not present

## 2020-07-03 MED ORDER — TRAMADOL HCL 50 MG PO TABS: 50.0000 mg | ORAL_TABLET | Freq: Two times a day (BID) | ORAL | 2 refills | Status: DC

## 2020-07-03 NOTE — Patient Instructions (Signed)
 Gout  Gout is painful swelling of your joints. Gout is a type of arthritis. It is caused by having too much uric acid in your body. Uric acid is a chemical that is made when your body breaks down substances called purines. If your body has too much uric acid, sharp crystals can form and build up in your joints. This causes pain and swelling. Gout attacks can happen quickly and be very painful (acute gout). Over time, the attacks can affect more joints and happen more often (chronic gout). What are the causes?  Too much uric acid in your blood. This can happen because: ? Your kidneys do not remove enough uric acid from your blood. ? Your body makes too much uric acid. ? You eat too many foods that are high in purines. These foods include organ meats, some seafood, and beer.  Trauma or stress. What increases the risk?  Having a family history of gout.  Being male and middle-aged.  Being male and having gone through menopause.  Being very overweight (obese).  Drinking alcohol, especially beer.  Not having enough water in the body (being dehydrated).  Losing weight too quickly.  Having an organ transplant.  Having lead poisoning.  Taking certain medicines.  Having kidney disease.  Having a skin condition called psoriasis. What are the signs or symptoms? An attack of acute gout usually happens in just one joint. The most common place is the big toe. Attacks often start at night. Other joints that may be affected include joints of the feet, ankle, knee, fingers, wrist, or elbow. Symptoms of an attack may include:  Very bad pain.  Warmth.  Swelling.  Stiffness.  Shiny, red, or purple skin.  Tenderness. The affected joint may be very painful to touch.  Chills and fever. Chronic gout may cause symptoms more often. More joints may be involved. You may also have white or yellow lumps (tophi) on your hands or feet or in other areas near your joints. How is this  treated?  Treatment for this condition has two phases: treating an acute attack and preventing future attacks.  Acute gout treatment may include: ? NSAIDs. ? Steroids. These are taken by mouth or injected into a joint. ? Colchicine. This medicine relieves pain and swelling. It can be given by mouth or through an IV tube.  Preventive treatment may include: ? Taking small doses of NSAIDs or colchicine daily. ? Using a medicine that reduces uric acid levels in your blood. ? Making changes to your diet. You may need to see a food expert (dietitian) about what to eat and drink to prevent gout. Follow these instructions at home: During a gout attack   If told, put ice on the painful area: ? Put ice in a plastic bag. ? Place a towel between your skin and the bag. ? Leave the ice on for 20 minutes, 2-3 times a day.  Raise (elevate) the painful joint above the level of your heart as often as you can.  Rest the joint as much as possible. If the joint is in your leg, you may be given crutches.  Follow instructions from your doctor about what you cannot eat or drink. Avoiding future gout attacks  Eat a low-purine diet. Avoid foods and drinks such as: ? Liver. ? Kidney. ? Anchovies. ? Asparagus. ? Herring. ? Mushrooms. ? Mussels. ? Beer.  Stay at a healthy weight. If you want to lose weight, talk with your doctor. Do not lose   weight too fast.  Start or continue an exercise plan as told by your doctor. Eating and drinking  Drink enough fluids to keep your pee (urine) pale yellow.  If you drink alcohol: ? Limit how much you use to:  0-1 drink a day for women.  0-2 drinks a day for men. ? Be aware of how much alcohol is in your drink. In the [place] one drink equals one 12 oz bottle of beer (355 mL), one 5 oz glass of wine (148 mL), or one 1 oz glass of hard liquor (44 mL). General instructions  Take over-the-counter and prescription medicines only as told by your doctor.  Do  not drive or use heavy machinery while taking prescription pain medicine.  Return to your normal activities as told by your doctor. Ask your doctor what activities are safe for you.  Keep all follow-up visits as told by your doctor. This is important. Contact a doctor if:  You have another gout attack.  You still have symptoms of a gout attack after 10 days of treatment.  You have problems (side effects) because of your medicines.  You have chills or a fever.  You have burning pain when you pee (urinate).  You have pain in your lower back or belly. Get help right away if:  You have very bad pain.  Your pain cannot be controlled.  You cannot pee. Summary  Gout is painful swelling of the joints.  The most common site of pain is the big toe, but it can affect other joints.  Medicines and avoiding some foods can help to prevent and treat gout attacks. This information is not intended to replace advice given to you by your health care provider. Make sure you discuss any questions you have with your health care provider. Document Revised: 06/29/2018 Document Reviewed: 06/29/2018 Elsevier Patient Education  2020 [employer] Inc.  

## 2020-07-04 NOTE — Progress Notes (Signed)
Subjective:   Patient ID: Ryan [name], male   DOB: 79 y.o.   MRN: [id number]   HPI Patient's not been seen for a number of years and states that he has a lot of pain on top of his foot and on the first metatarsal that is occurred over the last 4 to 5 days.  Had had history of gout and takes medicine but is not helped him this time.  Patient does not smoke likes to be active   Review of Systems  All other systems reviewed and are negative.       Objective:  Physical Exam Vitals and nursing note reviewed.  Constitutional:      Appearance: He is well-developed.  Pulmonary:     Effort: Pulmonary effort is normal.  Musculoskeletal:        General: Normal range of motion.  Skin:    General: Skin is warm.  Neurological:     Mental Status: He is alert.     Neurovascular status is found to be intact muscle strength adequate range of motion was adequate.  Patient has red inflammation on the dorsal aspect of the right midfoot that is painful when pressed and has inflammation fluid buildup around the first MPJ right slightly more to the plantar side of the first metatarsal head.  Both of them are sore and quite inflamed at the current time     Assessment:  Probability for acute gout with acute dorsal tendinitis and first MPJ capsulitis     Plan:  H&P reviewed both conditions.  I did do sterile prep I injected the dorsal tendon complex right 3 mg dexamethasone Kenalog 5 mg Xylocaine and around the first MPJ right 3 mg dexamethasone 5 mg Xylocaine advised on wider shoe soaks and reappoint to recheck.  Gave him education on gout and foods to avoid and other modalities and may consider other treatment if symptoms persist  X-rays indicate there is no indications of acute osteolysis arthritis of a moderate nature and no structural bunion deformity noted currently

## 2020-07-22 ENCOUNTER — Ambulatory Visit: Payer: Medicare Other | Admitting: Podiatry

## 2020-07-29 ENCOUNTER — Ambulatory Visit (INDEPENDENT_AMBULATORY_CARE_PROVIDER_SITE_OTHER): Payer: Medicare Other | Admitting: Cardiovascular Disease

## 2020-07-29 ENCOUNTER — Other Ambulatory Visit: Payer: Self-pay

## 2020-07-29 ENCOUNTER — Encounter: Payer: Self-pay | Admitting: Cardiovascular Disease

## 2020-07-29 VITALS — BP 130/74 | HR 90 | Ht 66.0 in | Wt 161.8 lb

## 2020-07-29 DIAGNOSIS — I2583 Coronary atherosclerosis due to lipid rich plaque: Secondary | ICD-10-CM | POA: Diagnosis not present

## 2020-07-29 DIAGNOSIS — I10 Essential (primary) hypertension: Secondary | ICD-10-CM

## 2020-07-29 DIAGNOSIS — I480 Paroxysmal atrial fibrillation: Secondary | ICD-10-CM

## 2020-07-29 DIAGNOSIS — I2510 Atherosclerotic heart disease of native coronary artery without angina pectoris: Secondary | ICD-10-CM | POA: Diagnosis not present

## 2020-07-29 DIAGNOSIS — R5383 Other fatigue: Secondary | ICD-10-CM | POA: Diagnosis not present

## 2020-07-29 DIAGNOSIS — E785 Hyperlipidemia, unspecified: Secondary | ICD-10-CM

## 2020-07-29 DIAGNOSIS — I5042 Chronic combined systolic (congestive) and diastolic (congestive) heart failure: Secondary | ICD-10-CM | POA: Diagnosis not present

## 2020-07-29 LAB — BASIC METABOLIC PANEL
BUN/Creatinine Ratio: 12 (ref 10–24)
BUN: 14 mg/dL (ref 8–27)
CO2: 23 mmol/L (ref 20–29)
Calcium: 9 mg/dL (ref 8.6–10.2)
Chloride: 102 mmol/L (ref 96–106)
Creatinine, Ser: 1.21 mg/dL (ref 0.76–1.27)
GFR calc Af Amer: 66 mL/min/{1.73_m2} (ref >59)
GFR calc non Af Amer: 57 mL/min/{1.73_m2} — ABNORMAL LOW (ref >59)
Glucose: 103 mg/dL — ABNORMAL HIGH (ref 65–99)
Potassium: 4.3 mmol/L (ref 3.5–5.2)
Sodium: 136 mmol/L (ref 134–144)

## 2020-07-29 LAB — CBC WITH DIFFERENTIAL/PLATELET
Basophils Absolute: 0.1 10*3/uL (ref 0.0–0.2)
Basos: 1 %
EOS (ABSOLUTE): 0.1 10*3/uL (ref 0.0–0.4)
Eos: 1 %
Hematocrit: 44.6 % (ref 37.5–51.0)
Hemoglobin: 14.5 g/dL (ref 13.0–17.7)
Immature Grans (Abs): 0.1 10*3/uL (ref 0.0–0.1)
Immature Granulocytes: 1 %
Lymphocytes Absolute: 1.2 10*3/uL (ref 0.7–3.1)
Lymphs: 13 %
MCH: 31.4 pg (ref 26.6–33.0)
MCHC: 32.5 g/dL (ref 31.5–35.7)
MCV: 97 fL (ref 79–97)
Monocytes Absolute: 0.7 10*3/uL (ref 0.1–0.9)
Monocytes: 8 %
Neutrophils Absolute: 7.2 10*3/uL — ABNORMAL HIGH (ref 1.4–7.0)
Neutrophils: 76 %
Platelets: 315 10*3/uL (ref 150–450)
RBC: 4.62 x10E6/uL (ref 4.14–5.80)
RDW: 13.3 % (ref 11.6–15.4)
WBC: 9.4 10*3/uL (ref 3.4–10.8)

## 2020-07-29 LAB — HEPATIC FUNCTION PANEL
ALT: 13 IU/L (ref 0–44)
AST: 22 IU/L (ref 0–40)
Albumin: 3.9 g/dL (ref 3.7–4.7)
Alkaline Phosphatase: 70 IU/L (ref 48–121)
Bilirubin Total: 0.5 mg/dL (ref 0.0–1.2)
Bilirubin, Direct: 0.26 mg/dL (ref 0.00–0.40)
Total Protein: 6.5 g/dL (ref 6.0–8.5)

## 2020-07-29 LAB — LIPID PANEL
Chol/HDL Ratio: 2.5 ratio (ref 0.0–5.0)
Cholesterol, Total: 99 mg/dL — ABNORMAL LOW (ref 100–199)
HDL: 39 mg/dL — ABNORMAL LOW (ref >39)
LDL Chol Calc (NIH): 45 mg/dL (ref 0–99)
Triglycerides: 72 mg/dL (ref 0–149)
VLDL Cholesterol Cal: 15 mg/dL (ref 5–40)

## 2020-07-29 LAB — TSH: TSH: 14 u[IU]/mL — ABNORMAL HIGH (ref 0.450–4.500)

## 2020-07-29 MED ORDER — AMLODIPINE BESYLATE 2.5 MG PO TABS: 2.5000 mg | ORAL_TABLET | Freq: Every day | ORAL | 3 refills | Status: DC

## 2020-07-29 MED ORDER — AMIODARONE HCL 200 MG PO TABS: 100.0000 mg | ORAL_TABLET | Freq: Every day | ORAL | 3 refills | Status: DC

## 2020-07-29 NOTE — Patient Instructions (Addendum)
Medication Instructions:  Your physician has recommended you make the following change in your medication:  DECREASE Amiodarone to 100 mg (1/2 tab) once daily DECREASE Amlodipine (Norvasc) to 2.5 mg once daily  *If you need a refill on your cardiac medications before your next appointment, please call your pharmacy*   Lab Work: TODAY - CBC, BMET, TSH, Liver panel, Cholesterol If you have labs (blood work) drawn today and your tests are completely normal, you will receive your results only by: Marland Kitchen MyChart Message (if you have MyChart) OR . A paper copy in the mail If you have any lab test that is abnormal or we need to change your treatment, we will call you to review the results.   Testing/Procedures: None Ordered    Follow-Up: At [facility], you and your health needs are our priority.  As part of our continuing mission to provide you with exceptional heart care, we have created designated Provider Care Teams.  These Care Teams include your primary Cardiologist (physician) and Advanced Practice Providers (APPs -  Physician Assistants and Nurse Practitioners) who all work together to provide you with the care you need, when you need it.     Your next appointment:   6 month(s)  The format for your next appointment:   In Person  Provider:   [name], PA-C or Robbie Lis, PA-C

## 2020-07-29 NOTE — Progress Notes (Signed)
Problem List  1. CAD - CABG  2. Essential Hypertension 3. Hyperlipidemia 4. Left carotid arterectomy  5. Atrial fib  6. Gout    Ryan [name] is a 79 y.o. man  With a history of hyperlipidemia, coronary artery disease, hypothyroidism  He was admitted March 31 with CP and dyspnea.   VQ was intermediate.   Was clinically thought to have a PE  So he was DC'd on NOAC. Was readmitted several days later with STEMI and cath revealed severe CAD. CT angio on April 16 showed no evidence of pulmonary embolus.   Oct. 4, 2016:  Ryan [name] is doing well.  Occasional episodes of orthostatic hypotension Is eating some salty snacks. Has lots of hip pain with walking  Encouraged him to try a stationary bike or elliptical  Had peripheral arterial duplex scan ( ? At [place]) that was reportedly normal.   March 17, 2016  Thought he had some symptoms of food poisoning - occurred after eating .  Also may have developed back issues related to taking the crestor also  He stopped the crestor and the symptoms have resolved.   June 24, 2016:  Ryan [name] is seen today .   Spent the weekend at [facility]. - has a house there.  No CP or dyspnea.      March 10, 2017:  Ryan [name] is seen back after a recent hospitalization for new atrial fib .   Was started on amio and Eliquis  The echo shows an EF of 45%.  Moderate AS   Gets nausea after taking one of his meds.  Is now on Amio 400 BID,  Was also found to have gout  - was started on colchicine - feels better.  Is having some diarrhea from the colchicine   Is back in [facility]   Aug. 14, 2018:  Ryan [name] is seen today for follow up of his CAD and atrial fib. He was hospitalized in April with urinary tract infection/pyelonephritis. He had mildly elevated troponin levels at that time. He also had acute kidney injury with a creatinine of 2.31 on admission. Creatinine has improved to 1.41  Had mildly elevated troponin levels at that time I had talked to Dr. Broadus John  about doing a Leane Call .   Still gets fatigued frequently . Has claudication .  Used to see Dr. Kellie Simmering.     Apr 20, 2018: Ryan [name] was admitted to the hospital in February, 2019 with chest pain. Took 1 sublingual nitroglycerin and 1 hydrocodone which relieved his chest pain.  Troponin levels were mildly elevated at 0.07, 0.08. Stress Myoview revealed a very small area of inferior ischemia. She did well and we decided to treat him medically.  He did not want to have a heart catheterization at that time.  He seems to have mildly elevated troponin levels on a chronic basis.  Has had recurrent CP  - one night he thought he would need to go to the hospital . He has stopped his rosuvastatin  and is taking just 1/2 of a Bisoprolol .  Has had progressive shortness of breath  Now he wants to have a cath   Aug. 1, 2019:  Ryan [name] is seen today for follow up visit .  No recent episodes of CP  Has lots of bruising / bleeding  - due to Eliquis therapy Has not been taking his synthroid for the past several months   January 18, 2020:  Ryan [name] is seen today for follow-up of his coronary artery  disease and atrial fibrillation. Occasional gas pain .  Relieved by passing gas or belching  Breathing is ok  Is now on Repatha  Has dizzy spells .   Symptoms are orthostatic hypotension  Is walking up and down his driveway - seems to be doing well with the exercise   July 29, 2020:  Is having dysuria.   Difficulty in urinating - asked him to go to [place] and give them a urine sample Started 5 days ago.   Lasix makes it worse  Has some chest pain  Has lost his appetite  Gets fatigued after taking his meds.     Medications Current Outpatient Medications  Medication Sig Dispense Refill  . acetaminophen-codeine (TYLENOL #3) 300-30 MG tablet Take 1-2 tablets by mouth every 8 (eight) hours as needed for moderate pain. 30 tablet 0  . allopurinol (ZYLOPRIM) 100 MG tablet Take 100 mg by mouth  daily.    Marland Kitchen amiodarone (PACERONE) 200 MG tablet Take 1 tablet (200 mg total) by mouth daily. 90 tablet 3  . amLODipine (NORVASC) 5 MG tablet TAKE 1 TABLET BY MOUTH EVERY DAY 90 tablet 2  . apixaban (ELIQUIS) 5 MG TABS tablet Take 1 tablet (5 mg total) by mouth 2 (two) times daily. 60 tablet 5  . bisoprolol (ZEBETA) 5 MG tablet Take 0.5 tablets (2.5 mg total) by mouth daily. 45 tablet 2  . Evolocumab (REPATHA SURECLICK) 814 MG/ML SOAJ Inject 1 pen into the skin every 14 (fourteen) days. 2 pen 11  . furosemide (LASIX) 20 MG tablet Take 1 tablet (20 mg total) by mouth daily. 90 tablet 3  . HYDROcodone-acetaminophen (NORCO/VICODIN) 5-325 MG tablet Take 1 tablet by mouth every 6 (six) hours as needed for moderate pain.    . isosorbide mononitrate (IMDUR) 30 MG 24 hr tablet Take 1 tablet (30 mg total) by mouth daily. 90 tablet 3  . levothyroxine (SYNTHROID) 50 MCG tablet Take 50 mcg by mouth daily before breakfast.     . LINZESS 72 MCG capsule Take 72 mcg by mouth daily.    . methocarbamol (ROBAXIN) 500 MG tablet Take 1 tablet (500 mg total) by mouth every 6 (six) hours as needed for muscle spasms. 40 tablet 1  . methylPREDNISolone (MEDROL) 4 MG tablet Medrol dose pack. Take as instructed 21 tablet 0  . nitroGLYCERIN (NITROSTAT) 0.4 MG SL tablet Place 1 tablet (0.4 mg total) under the tongue every 5 (five) minutes as needed for chest pain (CP or SOB). 25 tablet 5  . oxyCODONE (OXY IR/ROXICODONE) 5 MG immediate release tablet Take 1-2 tablets (5-10 mg total) by mouth every 6 (six) hours as needed for moderate pain (pain score 4-6). 30 tablet 0  . potassium chloride SA (KLOR-CON) 20 MEQ tablet Take 2 tablets (40 mEq total) by mouth daily. 180 tablet 2  . senna (SENOKOT) 8.6 MG TABS tablet Take 1 tablet (8.6 mg total) by mouth daily. 30 each 0  . tamsulosin (FLOMAX) 0.4 MG CAPS capsule Take 0.4 mg by mouth at bedtime.    . traMADol (ULTRAM) 50 MG tablet Take 1 tablet (50 mg total) by mouth 2 (two) times  daily. 30 tablet 2   No current facility-administered medications for this visit.   Review of systems: Noted in current history, otherwise review of systems is negative.  Physical Exam: Blood pressure 130/74, pulse 90, height 5\' 6"  (1.676 m), weight 161 lb 12.8 oz (73.4 kg), SpO2 98 %.  GEN:   Elderly male,  Slightly weak  HEENT: Normal NECK: No JVD; No carotid bruits LYMPHATICS: No lymphadenopathy CARDIAC: irreg. Irreg.  RESPIRATORY:  Clear to auscultation without rales, wheezing or rhonchi  ABDOMEN: Soft, non-tender, non-distended MUSCULOSKELETAL:  No edema; No deformity  SKIN: Warm and dry NEUROLOGIC:  Alert and oriented x 3   ECG :    Lab Results:  No results for input(s): WBC, HGB, HCT, PLT in the last 72 hours. BMET No results for input(s): NA, K, CL, CO2, GLUCOSE, BUN, CREATININE, CALCIUM in the last 72 hours.  Assessment/Plan    1. Paroxysmal Atrial fib:    Will reduce amio to 100 mg a day .   Check TSH, liver enz  2. CAD:   Rare episodes of CP .  farily stable pattern . Does not think he needs any further work up at this point but he will cont to watch for any signs of worsening angina    3. Hyperlipidemia -     Check labs today    4.  Hypothyroidism:   He is on amio 200 mg a day .   Reduce dose to 100 mg a day .  Check tsh today      5..  Essential hypertension-  BP is well controlled.     Thayer Headings, Brooke Bonito., MD, [facility] 07/29/2020, 11:47 AM [address]  [place] Pager [phone]

## 2020-07-30 DIAGNOSIS — E039 Hypothyroidism, unspecified: Secondary | ICD-10-CM | POA: Diagnosis not present

## 2020-07-30 DIAGNOSIS — R300 Dysuria: Secondary | ICD-10-CM | POA: Diagnosis not present

## 2020-07-30 DIAGNOSIS — N390 Urinary tract infection, site not specified: Secondary | ICD-10-CM | POA: Diagnosis not present

## 2020-07-30 DIAGNOSIS — R5383 Other fatigue: Secondary | ICD-10-CM | POA: Diagnosis not present

## 2020-08-21 ENCOUNTER — Telehealth: Payer: Self-pay | Admitting: *Deleted

## 2020-08-21 NOTE — Telephone Encounter (Signed)
Ortho bundle 1 year call attempted. Left VM requesting call back.

## 2020-08-29 ENCOUNTER — Telehealth: Payer: Self-pay | Admitting: *Deleted

## 2020-08-29 NOTE — Telephone Encounter (Signed)
Attempted 2nd Ortho bundle 1 year post op call to patient. No answer and left 2nd VM for him requesting to return call. 1 year call completed unless patient calls back as requested.

## 2020-08-30 ENCOUNTER — Telehealth: Payer: Self-pay | Admitting: Cardiovascular Disease

## 2020-08-30 MED ORDER — REPATHA SURECLICK 140 MG/ML ~~LOC~~ SOAJ: 1.0000 "pen " | SUBCUTANEOUS | 11 refills | Status: DC

## 2020-08-30 NOTE — Telephone Encounter (Signed)
*  STAT* If patient is at the pharmacy, call can be transferred to refill team.   1. Which medications need to be refilled? (please list name of each medication and dose if known) Evolocumab (REPATHA SURECLICK) 754 MG/ML SOAJ  2. Which pharmacy/location (including street and city if local pharmacy) is medication to be sent to? [place], [place] - [address] AT [place]  3. Do they need a 30 day or 90 day supply? N/a

## 2020-09-01 ENCOUNTER — Other Ambulatory Visit: Payer: Self-pay | Admitting: Cardiovascular Disease

## 2020-09-02 NOTE — Telephone Encounter (Signed)
Pt last saw Dr Acie Fredrickson 07/29/20, last labs 07/29/20 Creat 1.21, age 79, weight 73.4kg, based on specified criteria pt is on appropriate dosage of Eliquis 5mg  BID.  Will refill rx.

## 2020-09-11 ENCOUNTER — Telehealth: Payer: Self-pay | Admitting: Cardiovascular Disease

## 2020-09-11 NOTE — Telephone Encounter (Signed)
Pt's wife called and stated that they have been receiving the medication Evolocumab (REPATHA SURECLICK) 148 MG/ML SOAJ for no charge. Went to refill medication, and said now the charge was $127. Wants to speak with someone about it.

## 2020-09-11 NOTE — Telephone Encounter (Addendum)
Spoke with patient's wife. Discussed that [place] is closed. No other pt assistance available at this time. Could try manufacture pt assistance but previously not covering if had insurance. Spoke to wife about clinical trial vs retrying statins vs zetia. Clinical trial he would have to wait 3 months due to being on PCSK9i I have scheduled pt in clinic oct 5th to discuss options in the clinic.

## 2020-09-20 DIAGNOSIS — D6869 Other thrombophilia: Secondary | ICD-10-CM | POA: Diagnosis not present

## 2020-09-20 DIAGNOSIS — I10 Essential (primary) hypertension: Secondary | ICD-10-CM | POA: Diagnosis not present

## 2020-09-20 DIAGNOSIS — K5909 Other constipation: Secondary | ICD-10-CM | POA: Diagnosis not present

## 2020-09-20 DIAGNOSIS — E039 Hypothyroidism, unspecified: Secondary | ICD-10-CM | POA: Diagnosis not present

## 2020-09-20 DIAGNOSIS — M109 Gout, unspecified: Secondary | ICD-10-CM | POA: Diagnosis not present

## 2020-09-20 DIAGNOSIS — I2510 Atherosclerotic heart disease of native coronary artery without angina pectoris: Secondary | ICD-10-CM | POA: Diagnosis not present

## 2020-09-20 DIAGNOSIS — E785 Hyperlipidemia, unspecified: Secondary | ICD-10-CM | POA: Diagnosis not present

## 2020-09-20 DIAGNOSIS — R7301 Impaired fasting glucose: Secondary | ICD-10-CM | POA: Diagnosis not present

## 2020-09-20 DIAGNOSIS — Z23 Encounter for immunization: Secondary | ICD-10-CM | POA: Diagnosis not present

## 2020-09-20 DIAGNOSIS — R0989 Other specified symptoms and signs involving the circulatory and respiratory systems: Secondary | ICD-10-CM | POA: Diagnosis not present

## 2020-09-20 DIAGNOSIS — I480 Paroxysmal atrial fibrillation: Secondary | ICD-10-CM | POA: Diagnosis not present

## 2020-09-23 DIAGNOSIS — Z23 Encounter for immunization: Secondary | ICD-10-CM | POA: Diagnosis not present

## 2020-09-23 NOTE — Progress Notes (Signed)
Patient ID: Ryan [name]                 DOB: 09-Nov-1941                    MRN: [id number]     HPI: Ryan [name] is a 79 y.o. male patient referred to lipid clinic by Dr. Acie Fredrickson. PMH is significant for CAD s/p STEMI & CABG x4 in 2016, HLD, HTN, PVD, PAD, CHF, Afib, former smoker, CKD-3. EF 50-55% (01/2019).  Patient discussed with clinical pharmacist on 09/11/20 that Ryan [name] cost has increased from $0 to $127. Discussed that [place] is closed and no other patient assistance is currently available. Discussed retrying statins or Zetia vs clinical trial which will require a 59-month washout period due to being on PCSK9i.  Patient presents today in good spirits with his wife. Reports medication adherence and no side effects with Repatha. Reports having 74-month supply left.   Current Medications: Repatha 140 mg every 14 days Intolerances: atorvastatin 40 mg daily, pravastatin 10, 20 mg daily, rosuvastatin 10, 20 mg daily, rosuvastatin 5 mg MWF, Zetia 10 mg daily (myalgias), Welchol 1250 mg BID Risk Factors: progressive heart disease, former smoker, CKD-3 LDL goal: <55 mg/dL  Social History: former tobacco smoker (quit 1988); former tobacco chewer (quit 2010)  Labs: 07/29/20: LDL 45, TG 72, TC 99, HDL 39 (Repatha)  Past Medical History:  Diagnosis Date  . Arthritis   . CAD (coronary artery disease)   . Carotid artery occlusion   . Cataract    Bil eyes/worse in left eye  . CHF (congestive heart failure) ([place])   . Chronic back pain   . DVT (deep venous thrombosis) ([place])   . Dysrhythmia   . Enlarged prostate    takes Rapaflo daily  . GERD (gastroesophageal reflux disease)    occasional  . History of colon polyps   . History of gout    has colchicine prn  . History of kidney stones   . Hyperlipidemia    takes Crestor daily  . Hypertension    takes Amlodipine daily  . Hypothyroidism   . Myocardial infarction ([place])   . Peripheral vascular disease ([place])    . Pulmonary emboli ([place]) 03/20/2015   elevated d-dimer, intermediate V/Q study, atypical chest pain and SOB. Start on Xarelto 20mg  BID for 3 month  . Rapid atrial fibrillation ([place])   . Renal insufficiency   . Shortness of breath dyspnea   . Urinary frequency   . Urinary urgency     Current Outpatient Medications on File Prior to Visit  Medication Sig Dispense Refill  . acetaminophen-codeine (TYLENOL #3) 300-30 MG tablet Take 1-2 tablets by mouth every 8 (eight) hours as needed for moderate pain. 30 tablet 0  . allopurinol (ZYLOPRIM) 100 MG tablet Take 100 mg by mouth daily.    Marland Kitchen amiodarone (PACERONE) 200 MG tablet Take 0.5 tablets (100 mg total) by mouth daily. 45 tablet 3  . amLODipine (NORVASC) 2.5 MG tablet Take 1 tablet (2.5 mg total) by mouth daily. 90 tablet 3  . bisoprolol (ZEBETA) 5 MG tablet Take 0.5 tablets (2.5 mg total) by mouth daily. 45 tablet 2  . ELIQUIS 5 MG TABS tablet TAKE 1 TABLET(5 MG) BY MOUTH TWICE DAILY 60 tablet 5  . Evolocumab (REPATHA SURECLICK) 025 MG/ML SOAJ Inject 1 pen into the skin every 14 (fourteen) days. 2 mL 11  . furosemide (LASIX) 20 MG tablet Take 1 tablet (  20 mg total) by mouth daily. 90 tablet 3  . HYDROcodone-acetaminophen (NORCO/VICODIN) 5-325 MG tablet Take 1 tablet by mouth every 6 (six) hours as needed for moderate pain.    . isosorbide mononitrate (IMDUR) 30 MG 24 hr tablet Take 1 tablet (30 mg total) by mouth daily. 90 tablet 3  . levothyroxine (SYNTHROID) 50 MCG tablet Take 50 mcg by mouth daily before breakfast.     . LINZESS 72 MCG capsule Take 72 mcg by mouth daily.    . methocarbamol (ROBAXIN) 500 MG tablet Take 1 tablet (500 mg total) by mouth every 6 (six) hours as needed for muscle spasms. 40 tablet 1  . methylPREDNISolone (MEDROL) 4 MG tablet Medrol dose pack. Take as instructed 21 tablet 0  . nitroGLYCERIN (NITROSTAT) 0.4 MG SL tablet Place 1 tablet (0.4 mg total) under the tongue every 5 (five) minutes as needed for chest pain (CP  or SOB). 25 tablet 5  . oxyCODONE (OXY IR/ROXICODONE) 5 MG immediate release tablet Take 1-2 tablets (5-10 mg total) by mouth every 6 (six) hours as needed for moderate pain (pain score 4-6). 30 tablet 0  . potassium chloride SA (KLOR-CON) 20 MEQ tablet Take 2 tablets (40 mEq total) by mouth daily. 180 tablet 2  . senna (SENOKOT) 8.6 MG TABS tablet Take 1 tablet (8.6 mg total) by mouth daily. 30 each 0  . tamsulosin (FLOMAX) 0.4 MG CAPS capsule Take 0.4 mg by mouth at bedtime.    . traMADol (ULTRAM) 50 MG tablet Take 1 tablet (50 mg total) by mouth 2 (two) times daily. 30 tablet 2   No current facility-administered medications on file prior to visit.    Allergies  Allergen Reactions  . Zetia [Ezetimibe] Other (See Comments)    Myalgias   . Codeine Nausea And Vomiting       . Unithroid [Levothyroxine Sodium] Other (See Comments)    Caused blurry vision per pt    Assessment/Plan:  1. Hyperlipidemia -  LDL at goal <55 mg/dL due to progressive heart disease, former smoker, and CKD-3. Patient tolerating Repatha with no side effects, however voiced concern of $127 copay cost since patient assistance funds are not available anymore. Explained to patient there is limited lipid-lowering options due to history of statin and zetia intolerance. Discussed retrying statins or Zetia vs enrolling in the [place]-4 clinical trial with Inclisiran which will require a 62-month washout period due to being on PCSK9i. After discussion, patient is not interested in enrolling in the [place] 4 clinical trial. Patient initially said in visit that he wanted to try rosuvastatin 5mg  three times a week, but then called clinic after visit and decided he will continue Repatha despite expensive copay cost. Patient explained that he knows Repatha works very well and wants to avoid side-effects associated with statins. Will continue Repatha 140 mg every 14 days and schedule labs for fasting lipid panel and LFTs in 3 months. Can  consider rosuvastatin 5 mg MWF if Repatha cost is prohibitive. Discussed healthy eating options such as a diet full of vegetables, fruit and lean meats (chicken, [place], fish) and to limit carbs (bread, pasta, sugar, rice) and red meat consumption. Encouraged patient to exercise to the best of his abilities.   Lorel Monaco, PharmD PGY2 [place] [address] [place], [place] [address] Phone: [phone]; Fax: [phone]

## 2020-09-24 ENCOUNTER — Other Ambulatory Visit: Payer: Self-pay

## 2020-09-24 ENCOUNTER — Ambulatory Visit (INDEPENDENT_AMBULATORY_CARE_PROVIDER_SITE_OTHER): Payer: Medicare Other | Admitting: Pharmacist

## 2020-09-24 DIAGNOSIS — E785 Hyperlipidemia, unspecified: Secondary | ICD-10-CM | POA: Diagnosis not present

## 2020-09-24 MED ORDER — ROSUVASTATIN CALCIUM 5 MG PO TABS: 5.0000 mg | ORAL_TABLET | ORAL | 3 refills | Status: DC

## 2020-09-24 NOTE — Patient Instructions (Addendum)
Nice to see you today!  Keep up the good work with diet and exercise. Aim for a diet full of vegetables, fruit and lean meats (chicken, [place], fish). Try to limit carbs (bread, pasta, sugar, rice) and red meat consumption.  Your goal LDL is <55 mg/dL, you're currently at 45 mg/dL  Medication Changes: Begin taking Rosuvastatin 5 mg on Mondays, Wednesdays, Fridays  Continue Repatha every 14 days  Please give [place] a call at [phone] with any questions or concerns.  For PCSK9i, inject once every other week (any day of the week that works for you) into the fatty skin of stomach, upper outer thigh or back of the arm. Clean the site with soap and warm water or an alcohol pad. Keep the medication in the fridge until you are ready to give your dose, then take it out and let warm up to room temperature for 30-60 mins.

## 2020-09-26 ENCOUNTER — Telehealth: Payer: Self-pay | Admitting: *Deleted

## 2020-09-26 ENCOUNTER — Encounter: Payer: Self-pay | Admitting: Cardiovascular Disease

## 2020-09-26 NOTE — Telephone Encounter (Signed)
Left message to please call the office in regards to clearance request our office received today. Need further information as to dental procedure etc.

## 2020-09-26 NOTE — Telephone Encounter (Signed)
error 

## 2020-09-27 NOTE — Telephone Encounter (Signed)
1. What dental office are you calling from? [facility]   2. What is your office phone number? [phone]   3. What is your fax number? [phone]  4. What type of procedure is the patient having performed? Tooth Extraction (#31)   5. What date is procedure scheduled or is the patient there now? 09/27/20   6. What is your question? Their office is inquiring about whether or not patient needs to hold any medications pre/post op. Please advise.

## 2020-09-27 NOTE — Telephone Encounter (Signed)
Dental office called to check on status.

## 2020-09-27 NOTE — Telephone Encounter (Signed)
No need to hold anticoagulation for one tooth extraction  

## 2020-09-27 NOTE — Telephone Encounter (Signed)
   Primary Cardiologist: Mertie Moores, MD  Chart reviewed as part of pre-operative protocol coverage.   Simple dental extractions are considered low risk procedures per guidelines and generally do not require any specific cardiac clearance. It is also generally accepted that for simple extractions and dental cleanings, there is no need to interrupt blood thinner therapy.    I will route this recommendation to the requesting party via Epic fax function and remove from pre-op pool.  Please call with questions.  [place], [place] 09/27/2020, 11:07 AM

## 2020-11-20 ENCOUNTER — Other Ambulatory Visit: Payer: Self-pay | Admitting: Cardiovascular Disease

## 2020-12-04 DIAGNOSIS — R3912 Poor urinary stream: Secondary | ICD-10-CM | POA: Diagnosis not present

## 2020-12-04 DIAGNOSIS — R972 Elevated prostate specific antigen [PSA]: Secondary | ICD-10-CM | POA: Diagnosis not present

## 2020-12-04 DIAGNOSIS — N401 Enlarged prostate with lower urinary tract symptoms: Secondary | ICD-10-CM | POA: Diagnosis not present

## 2020-12-23 ENCOUNTER — Telehealth: Payer: Self-pay | Admitting: Pharmacist

## 2020-12-23 NOTE — Telephone Encounter (Signed)
Called patient and advised that the [employer] grant has reopened for existing patients. He provided me with the info I needed to reapply. Grant approved.  Pharmacy Card Id [id number] Group [id number] PCN [id number] BIN [id number]  Info called into [facility]- copay $0  Pt very appreciative.

## 2020-12-25 ENCOUNTER — Other Ambulatory Visit: Payer: Medicare Other

## 2020-12-27 ENCOUNTER — Emergency Department (HOSPITAL_COMMUNITY): Payer: Medicare Other

## 2020-12-27 ENCOUNTER — Other Ambulatory Visit: Payer: Self-pay

## 2020-12-27 ENCOUNTER — Inpatient Hospital Stay (HOSPITAL_COMMUNITY)
Admission: EM | Admit: 2020-12-27 | Discharge: 2020-12-31 | DRG: 226 | Disposition: A | Discharge status: Home / Self Care | Payer: Medicare Other | Attending: Internal Medicine | Admitting: Internal Medicine

## 2020-12-27 DIAGNOSIS — Z87891 Personal history of nicotine dependence: Secondary | ICD-10-CM

## 2020-12-27 DIAGNOSIS — N179 Acute kidney failure, unspecified: Secondary | ICD-10-CM | POA: Diagnosis not present

## 2020-12-27 DIAGNOSIS — M109 Gout, unspecified: Secondary | ICD-10-CM | POA: Diagnosis present

## 2020-12-27 DIAGNOSIS — G8929 Other chronic pain: Secondary | ICD-10-CM | POA: Diagnosis present

## 2020-12-27 DIAGNOSIS — N183 Chronic kidney disease, stage 3 unspecified: Secondary | ICD-10-CM | POA: Diagnosis not present

## 2020-12-27 DIAGNOSIS — I509 Heart failure, unspecified: Secondary | ICD-10-CM | POA: Diagnosis not present

## 2020-12-27 DIAGNOSIS — R000 Tachycardia, unspecified: Secondary | ICD-10-CM

## 2020-12-27 DIAGNOSIS — Z79890 Hormone replacement therapy (postmenopausal): Secondary | ICD-10-CM

## 2020-12-27 DIAGNOSIS — I130 Hypertensive heart and chronic kidney disease with heart failure and stage 1 through stage 4 chronic kidney disease, or unspecified chronic kidney disease: Secondary | ICD-10-CM | POA: Diagnosis not present

## 2020-12-27 DIAGNOSIS — Z87442 Personal history of urinary calculi: Secondary | ICD-10-CM

## 2020-12-27 DIAGNOSIS — E039 Hypothyroidism, unspecified: Secondary | ICD-10-CM | POA: Diagnosis present

## 2020-12-27 DIAGNOSIS — Z7901 Long term (current) use of anticoagulants: Secondary | ICD-10-CM

## 2020-12-27 DIAGNOSIS — E785 Hyperlipidemia, unspecified: Secondary | ICD-10-CM | POA: Diagnosis present

## 2020-12-27 DIAGNOSIS — I472 Ventricular tachycardia, unspecified: Secondary | ICD-10-CM

## 2020-12-27 DIAGNOSIS — M1990 Unspecified osteoarthritis, unspecified site: Secondary | ICD-10-CM | POA: Diagnosis present

## 2020-12-27 DIAGNOSIS — R079 Chest pain, unspecified: Secondary | ICD-10-CM | POA: Diagnosis not present

## 2020-12-27 DIAGNOSIS — I517 Cardiomegaly: Secondary | ICD-10-CM | POA: Diagnosis not present

## 2020-12-27 DIAGNOSIS — I739 Peripheral vascular disease, unspecified: Secondary | ICD-10-CM | POA: Diagnosis present

## 2020-12-27 DIAGNOSIS — Z79899 Other long term (current) drug therapy: Secondary | ICD-10-CM

## 2020-12-27 DIAGNOSIS — N400 Enlarged prostate without lower urinary tract symptoms: Secondary | ICD-10-CM | POA: Diagnosis present

## 2020-12-27 DIAGNOSIS — I255 Ischemic cardiomyopathy: Secondary | ICD-10-CM | POA: Diagnosis present

## 2020-12-27 DIAGNOSIS — Z20822 Contact with and (suspected) exposure to covid-19: Secondary | ICD-10-CM | POA: Diagnosis not present

## 2020-12-27 DIAGNOSIS — Z888 Allergy status to other drugs, medicaments and biological substances status: Secondary | ICD-10-CM

## 2020-12-27 DIAGNOSIS — I252 Old myocardial infarction: Secondary | ICD-10-CM

## 2020-12-27 DIAGNOSIS — Z8249 Family history of ischemic heart disease and other diseases of the circulatory system: Secondary | ICD-10-CM

## 2020-12-27 DIAGNOSIS — Z8701 Personal history of pneumonia (recurrent): Secondary | ICD-10-CM

## 2020-12-27 DIAGNOSIS — I5043 Acute on chronic combined systolic (congestive) and diastolic (congestive) heart failure: Secondary | ICD-10-CM | POA: Diagnosis present

## 2020-12-27 DIAGNOSIS — I25118 Atherosclerotic heart disease of native coronary artery with other forms of angina pectoris: Secondary | ICD-10-CM | POA: Diagnosis present

## 2020-12-27 DIAGNOSIS — M549 Dorsalgia, unspecified: Secondary | ICD-10-CM | POA: Diagnosis present

## 2020-12-27 DIAGNOSIS — I5032 Chronic diastolic (congestive) heart failure: Secondary | ICD-10-CM

## 2020-12-27 DIAGNOSIS — I5030 Unspecified diastolic (congestive) heart failure: Secondary | ICD-10-CM

## 2020-12-27 DIAGNOSIS — Z885 Allergy status to narcotic agent status: Secondary | ICD-10-CM

## 2020-12-27 DIAGNOSIS — K219 Gastro-esophageal reflux disease without esophagitis: Secondary | ICD-10-CM | POA: Diagnosis present

## 2020-12-27 DIAGNOSIS — J9811 Atelectasis: Secondary | ICD-10-CM | POA: Diagnosis not present

## 2020-12-27 DIAGNOSIS — I480 Paroxysmal atrial fibrillation: Secondary | ICD-10-CM

## 2020-12-27 DIAGNOSIS — Z95810 Presence of automatic (implantable) cardiac defibrillator: Secondary | ICD-10-CM

## 2020-12-27 DIAGNOSIS — Z86711 Personal history of pulmonary embolism: Secondary | ICD-10-CM

## 2020-12-27 DIAGNOSIS — Z951 Presence of aortocoronary bypass graft: Secondary | ICD-10-CM

## 2020-12-27 DIAGNOSIS — I4819 Other persistent atrial fibrillation: Secondary | ICD-10-CM | POA: Diagnosis present

## 2020-12-27 LAB — CBC WITH DIFFERENTIAL/PLATELET
Abs Immature Granulocytes: 0.11 10*3/uL — ABNORMAL HIGH (ref 0.00–0.07)
Basophils Absolute: 0 10*3/uL (ref 0.0–0.1)
Basophils Relative: 0 %
Eosinophils Absolute: 0 10*3/uL (ref 0.0–0.5)
Eosinophils Relative: 0 %
HCT: 41.6 % (ref 39.0–52.0)
Hemoglobin: 13.5 g/dL (ref 13.0–17.0)
Immature Granulocytes: 1 %
Lymphocytes Relative: 5 %
Lymphs Abs: 0.7 10*3/uL (ref 0.7–4.0)
MCH: 32.5 pg (ref 26.0–34.0)
MCHC: 32.5 g/dL (ref 30.0–36.0)
MCV: 100.2 fL — ABNORMAL HIGH (ref 80.0–100.0)
Monocytes Absolute: 0.5 10*3/uL (ref 0.1–1.0)
Monocytes Relative: 4 %
Neutro Abs: 11.8 10*3/uL — ABNORMAL HIGH (ref 1.7–7.7)
Neutrophils Relative %: 90 %
Platelets: 248 10*3/uL (ref 150–400)
RBC: 4.15 MIL/uL — ABNORMAL LOW (ref 4.22–5.81)
RDW: 13.2 % (ref 11.5–15.5)
WBC: 13.1 10*3/uL — ABNORMAL HIGH (ref 4.0–10.5)
nRBC: 0 % (ref 0.0–0.2)

## 2020-12-27 MED ORDER — MORPHINE SULFATE (PF) 2 MG/ML IV SOLN
2.0000 mg | Freq: Once | INTRAVENOUS | Status: AC
Start: 2020-12-27 — End: 2020-12-27
  Administered 2020-12-27: 2 mg via INTRAVENOUS
  Filled 2020-12-27: qty 1

## 2020-12-27 MED ORDER — ONDANSETRON HCL 4 MG/2ML IJ SOLN
4.0000 mg | Freq: Once | INTRAMUSCULAR | Status: AC
  Administered 2020-12-27: 4 mg via INTRAVENOUS
  Filled 2020-12-27: qty 2

## 2020-12-27 NOTE — ED Triage Notes (Signed)
BIB [facility] from home. PT started having CP at 2100. Took 2 nitro. Pt was pale, diaphoretic. Upon arrival pt was Vtach  At 180. EMS tried 150 mg of Amio. Didn't help. Pt given 2.5 mg of Versed for sedation. Pt was cardioverted at 100 J at 2245 to sinus Rhythm 60-80.  Hx: 2 MI, CABG  104/62 96% NRB 60-80

## 2020-12-27 NOTE — ED Provider Notes (Signed)
[facility] EMERGENCY DEPARTMENT Provider Note   CSN: [id number] Arrival date & time: 12/27/20  2313   History Chief Complaint  Patient presents with  . VTACH    Ryan [name] is a 80 y.o. male.  The history is provided by the patient.  He has history of hypertension, hyperlipidemia, coronary artery disease, atrial fibrillation anticoagulated on apixaban and is brought in by ambulance after having an episode of rapid heartbeat and chest pain at home.  He states that he was watching television when his heart suddenly started racing and he developed severe pain across his chest with associated dyspnea and nausea.  He denies any diaphoresis.  Nothing made symptoms better, nothing made them worse.  EMS noted wide-complex tachycardia and administered amiodarone with little change and then cardioverted him with conversion to atrial fibrillation with controlled ventricular response.  Patient states that he feels much better following cardioversion but still has residual pain in the right side of his chest and right scapular area which he rates at 4/10.  He is unable to characterize the pain.  He has never had any episodes like this before.  Past Medical History:  Diagnosis Date  . Arthritis   . CAD (coronary artery disease)   . Carotid artery occlusion   . Cataract    Bil eyes/worse in left eye  . CHF (congestive heart failure) ([place])   . Chronic back pain   . DVT (deep venous thrombosis) ([place])   . Dysrhythmia   . Enlarged prostate    takes Rapaflo daily  . GERD (gastroesophageal reflux disease)    occasional  . History of colon polyps   . History of gout    has colchicine prn  . History of kidney stones   . Hyperlipidemia    takes Crestor daily  . Hypertension    takes Amlodipine daily  . Hypothyroidism   . Myocardial infarction ([place])   . Peripheral vascular disease ([place])   . Pulmonary emboli ([place]) 03/20/2015   elevated d-dimer, intermediate V/Q study,  atypical chest pain and SOB. Start on Xarelto 20mg  BID for 3 month  . Rapid atrial fibrillation ([place])   . Renal insufficiency   . Shortness of breath dyspnea   . Urinary frequency   . Urinary urgency     Patient Active Problem List   Diagnosis Date Noted  . S/P total hip arthroplasty 08/16/2019  . Unilateral primary osteoarthritis, right hip 07/18/2019  . PAF (paroxysmal atrial fibrillation) ([place]) 01/27/2019  . Dizziness 01/27/2019  . History of pneumonia 01/27/2019  . CKD (chronic kidney disease) stage 3, GFR 30-59 ml/min (HCC) 01/27/2019  . Chest pain 02/07/2018  . Elevated troponin 03/23/2017  . Sigmoid diverticulitis 03/23/2017  . Acute pyelonephritis 03/23/2017  . AKI (acute kidney injury) ([place]) 03/23/2017  . Wrist pain, acute, right   . Hypothyroidism   . Dyspnea 02/26/2017  . Shoulder blade pain 02/26/2017  . SOB (shortness of breath) 02/26/2017  . Chronic right shoulder pain   . Chronic combined systolic and diastolic CHF (congestive heart failure) ([place])   . Hyperlipidemia LDL goal <70 01/20/2017  . Chronic cholecystitis 07/27/2016  . PAD (peripheral artery disease) ([place]) 07/09/2015  . S/P CABG x 4 04/05/2015  . Coronary artery disease involving native coronary artery with unstable angina pectoris ([place])   . Atypical chest pain 03/20/2015  . Carotid artery disease ([place]) 10/09/2014  . PVD (peripheral vascular disease) ([place]) 10/09/2014  . HTN (hypertension) 05/30/2014  . Arthritis of  left hip 01/09/2014  . Status post THR (total hip replacement) 01/09/2014  . Spinal stenosis, lumbar 01/06/2013    Class: Diagnosis of  . Coronary artery disease 01/05/2013  . Atherosclerosis of native artery of extremity with intermittent claudication ([place]) 10/18/2012    Past Surgical History:  Procedure Laterality Date  . APPENDECTOMY    . BACK SURGERY     5 times  . big toe surgery    . CARDIAC CATHETERIZATION     2010    dr Acie Fredrickson  . cataract surgery     left eye  .  CHOLECYSTECTOMY N/A 07/27/2016   Procedure: LAPAROSCOPIC CHOLECYSTECTOMY;  Surgeon: Mickeal Skinner, MD;  Location: [facility];  Service: General;  Laterality: N/A;  . COLONOSCOPY    . CORONARY ARTERY BYPASS GRAFT N/A 04/05/2015   Procedure: CORONARY ARTERY BYPASS GRAFTING (CABG)X4 LIMA-LAD; SVG-DIAG1-DIAG2; SVG-PD;  Surgeon: [name], MD;  Location: [facility];  Service: Open Heart Surgery;  Laterality: N/A;  . CORONARY/GRAFT ANGIOGRAPHY N/A 04/22/2018   Procedure: CORONARY/GRAFT ANGIOGRAPHY;  Surgeon: Martinique, Peter M, MD;  Location: [facility] CV LAB;  Service: Cardiovascular;  Laterality: N/A;  . CYSTOSCOPY    . ENDARTERECTOMY Left 04/24/2016   Procedure: ENDARTERECTOMY LEFT CAROTID;  Surgeon: Mal Misty, MD;  Location: [facility];  Service: Vascular;  Laterality: Left;  . EYE SURGERY    . FEMORAL ARTERY - POPLITEAL ARTERY BYPASS GRAFT    . JOINT REPLACEMENT     shoulder  . LEFT HEART CATHETERIZATION WITH CORONARY ANGIOGRAM N/A 04/03/2015   Procedure: LEFT HEART CATHETERIZATION WITH CORONARY ANGIOGRAM;  Surgeon: Troy Sine, MD;  Location[facility] CATH LAB;  Service: Cardiovascular;  Laterality: N/A;  . LUMBAR LAMINECTOMY  01/06/2013   Procedure: MICRODISCECTOMY LUMBAR LAMINECTOMY;  Surgeon: Marybelle Killings, MD;  Location: [facility];  Service: Orthopedics;  Laterality: N/A;  L3-4 decompression  . LUMBAR LAMINECTOMY/DECOMPRESSION MICRODISCECTOMY  02/12/2012   Procedure: LUMBAR LAMINECTOMY/DECOMPRESSION MICRODISCECTOMY;  Surgeon: Floyce Stakes, MD;  Location: [facility];  Service: Neurosurgery;  Laterality: N/A;  Lumbar four-five laminectomy  . PATCH ANGIOPLASTY Left 04/24/2016   Procedure: LEFT CAROTID ARTERY PATCH ANGIOPLASTY;  Surgeon: Mal Misty, MD;  Location: [facility];  Service: Vascular;  Laterality: Left;  . RIGHT HEART CATH AND CORONARY/GRAFT ANGIOGRAPHY N/A 01/30/2019   Procedure: RIGHT HEART CATH AND CORONARY/GRAFT ANGIOGRAPHY;  Surgeon: Troy Sine, MD;  Location: [facility] CV LAB;   Service: Cardiovascular;  Laterality: N/A;  . STERIOD INJECTION Right 01/09/2014   Procedure: STEROID INJECTION;  Surgeon: Mcarthur Rossetti, MD;  Location: [facility];  Service: Orthopedics;  Laterality: Right;  . TEE WITHOUT CARDIOVERSION N/A 04/05/2015   Procedure: TRANSESOPHAGEAL ECHOCARDIOGRAM (TEE);  Surgeon: [name], MD;  Location: [facility];  Service: Open Heart Surgery;  Laterality: N/A;  . TOTAL HIP ARTHROPLASTY Left 01/09/2014   DR Ninfa Linden  . TOTAL HIP ARTHROPLASTY Left 01/09/2014   Procedure: LEFT TOTAL HIP ARTHROPLASTY ANTERIOR APPROACH and Steroid Injection Right hip;  Surgeon: Mcarthur Rossetti, MD;  Location: [facility];  Service: Orthopedics;  Laterality: Left;  . TOTAL HIP ARTHROPLASTY Right 08/15/2019  . TOTAL HIP ARTHROPLASTY Right 08/15/2019   Procedure: RIGHT TOTAL HIP ARTHROPLASTY ANTERIOR APPROACH;  Surgeon: Mcarthur Rossetti, MD;  Location: [facility];  Service: Orthopedics;  Laterality: Right;       Family History  Problem Relation Age of Onset  . Heart disease Father   . Heart attack Father   . Heart disease Sister   .  Hypertension Sister   . Heart attack Sister   . Hypertension Mother   . Diabetes Son     Social History   Tobacco Use  . Smoking status: Former Smoker    Types: Cigarettes    Quit date: 02/04/1987    Years since quitting: 33.9  . Smokeless tobacco: Former Systems developer    Types: Chew    Quit date: 07/20/2009  . Tobacco comment: quit 35+yrs ago  Vaping Use  . Vaping Use: Never used  Substance Use Topics  . Alcohol use: No    Alcohol/week: 0.0 standard drinks  . Drug use: No    Home Medications Prior to Admission medications   Medication Sig Start Date End Date Taking? Authorizing Provider  acetaminophen-codeine (TYLENOL #3) 300-30 MG tablet Take 1-2 tablets by mouth every 8 (eight) hours as needed for moderate pain. 02/29/20   Mcarthur Rossetti, MD  allopurinol (ZYLOPRIM) 100 MG tablet Take 100 mg by mouth daily. 05/29/20    [provider]  amiodarone (PACERONE) 200 MG tablet Take 0.5 tablets (100 mg total) by mouth daily. 07/29/20   [name], [name], MD  amLODipine (NORVASC) 2.5 MG tablet Take 1 tablet (2.5 mg total) by mouth daily. 07/29/20   [name], [name], MD  bisoprolol (ZEBETA) 5 MG tablet Take 0.5 tablets (2.5 mg total) by mouth daily. 05/10/20   [name], [name], MD  ELIQUIS 5 MG TABS tablet TAKE 1 TABLET(5 MG) BY MOUTH TWICE DAILY 09/02/20   [name], [name], MD  Evolocumab (REPATHA SURECLICK) [id number] MG/ML SOAJ Inject 1 pen into the skin every 14 (fourteen) days. 08/30/20   [name], [name], MD  furosemide (LASIX) 20 MG tablet Take 1 tablet (20 mg total) by mouth daily. 08/04/18   [name], [name], MD  HYDROcodone-acetaminophen (NORCO/VICODIN) 5-325 MG tablet Take 1 tablet by mouth every 6 (six) hours as needed for moderate pain.    [provider]  isosorbide mononitrate (IMDUR) 30 MG 24 hr tablet Take 1 tablet (30 mg total) by mouth daily. 01/18/20   [name], [name], MD  levothyroxine (SYNTHROID) 50 MCG tablet Take 50 mcg by mouth daily before breakfast.  07/17/19   [provider]  LINZESS 72 MCG capsule Take 72 mcg by mouth daily. 02/23/20   [provider]  methocarbamol (ROBAXIN) 500 MG tablet Take 1 tablet (500 mg total) by mouth every 6 (six) hours as needed for muscle spasms. 08/29/19   Pete Pelt, PA-C  methylPREDNISolone (MEDROL) 4 MG tablet Medrol dose pack. Take as instructed 02/29/20   Mcarthur Rossetti, MD  nitroGLYCERIN (NITROSTAT) 0.4 MG SL tablet PLACE 1 TABLET UNDER THE TONGUE EVERY 5 MINUTES AS NEEDED FOR CHEST PAIN OR SHORTNESS OF BREATH 11/21/20   [name], [name], MD  oxyCODONE (OXY IR/ROXICODONE) 5 MG immediate release tablet Take 1-2 tablets (5-10 mg total) by mouth every 6 (six) hours as needed for moderate pain (pain score 4-6). 08/29/19   Pete Pelt, PA-C  potassium chloride SA (KLOR-CON) 20 MEQ tablet Take 2 tablets (40 mEq total) by mouth daily.  05/10/20   [name], [name], MD  senna (SENOKOT) 8.6 MG TABS tablet Take 1 tablet (8.6 mg total) by mouth daily. 02/09/18   Kayleen Memos, DO  tamsulosin (FLOMAX) 0.4 MG CAPS capsule Take 0.4 mg by mouth at bedtime. 03/01/17   [provider]  traMADol (ULTRAM) 50 MG tablet Take 1 tablet (50 mg total) by mouth 2 (two) times daily. 07/03/20  Wallene Huh, DPM    Allergies    Zetia [ezetimibe], Codeine, and Unithroid [levothyroxine sodium]  Review of Systems   Review of Systems  All other systems reviewed and are negative.   Physical Exam Updated Vital Signs BP 136/69   Pulse 88   Temp 97.6 F (36.4 C) (Oral)   Resp 19   Ht 5\' 7"  (1.702 m)   Wt 73.5 kg   SpO2 98%   BMI 25.37 kg/m   Physical Exam Vitals and nursing note reviewed.   80 year old male, resting comfortably and in no acute distress. Vital signs are normal. Oxygen saturation is 98%, which is normal. Head is normocephalic and atraumatic. PERRLA, EOMI. Oropharynx is clear. Neck is nontender and supple without adenopathy or JVD. Back is nontender and there is no CVA tenderness. Lungs are clear without rales, wheezes, or rhonchi. Chest is nontender. Heart has regular rate and rhythm without murmur. Abdomen is soft, flat, nontender without masses or hepatosplenomegaly and peristalsis is normoactive. Extremities have no cyanosis or edema, full range of motion is present. Skin is warm and dry without rash. Neurologic: Mental status is normal, cranial nerves are intact, there are no motor or sensory deficits.  ED Results / Procedures / Treatments   Labs (all labs ordered are listed, but only abnormal results are displayed) Labs Reviewed  BASIC METABOLIC PANEL - Abnormal; Notable for the following components:      Result Value   Glucose, Bld 256 (*)    Creatinine, Ser 1.47 (*)    Calcium 8.5 (*)    GFR, Estimated 48 (*)    All other components within normal limits  CBC WITH DIFFERENTIAL/PLATELET -  Abnormal; Notable for the following components:   WBC 13.1 (*)    RBC 4.15 (*)    MCV 100.2 (*)    Neutro Abs 11.8 (*)    Abs Immature Granulocytes 0.11 (*)    All other components within normal limits  TROPONIN I (HIGH SENSITIVITY) - Abnormal; Notable for the following components:   Troponin I (High Sensitivity) 64 (*)    All other components within normal limits  SARS CORONAVIRUS 2 (TAT 6-24 HRS)  MAGNESIUM  TROPONIN I (HIGH SENSITIVITY)    EKG EKG Interpretation  Date/Time:  Friday December 27 2020 23:26:00 EST Ventricular Rate:  66 PR Interval:    QRS Duration: 98 QT Interval:  527 QTC Calculation: 553 R Axis:   74 Text Interpretation: Atrial fibrillation Low voltage, precordial leads Prolonged QT interval When compared with ECG of 01/30/2019, Atrial fibrillation has replaced Sinus bradycardia QT has lengthened Confirmed by Delora Fuel ([id number]) on 12/27/2020 11:29:36 PM   Radiology DG Chest Port 1 View  Result Date: 12/28/2020 CLINICAL DATA:  Chest pain. EXAM: PORTABLE CHEST 1 VIEW COMPARISON:  Radiograph 01/27/2019 FINDINGS: Median sternotomy and CABG. Mild cardiomegaly. Aortic atherosclerosis. No pulmonary edema. Mild chronic interstitial coarsening. Streaky opacities at the left lung base. No pleural fluid or pneumothorax. Postsurgical change of the left shoulder. Overlying monitoring devices partially obscure detailed assessment. IMPRESSION: 1. Streaky left basilar opacities, favor atelectasis. 2. Mild cardiomegaly post CABG. Electronically Signed   By: Keith Rake M.D.   On: 12/28/2020 00:08    Procedures Procedures  CRITICAL CARE Performed by: Delora Fuel Total critical care time: 50 minutes Critical care time was exclusive of separately billable procedures and treating other patients. Critical care was necessary to treat or prevent imminent or life-threatening deterioration. Critical care was time spent personally by me  on the following activities: development of  treatment plan with patient and/or surrogate as well as nursing, discussions with consultants, evaluation of patient's response to treatment, examination of patient, obtaining history from patient or surrogate, ordering and performing treatments and interventions, ordering and review of laboratory studies, ordering and review of radiographic studies, pulse oximetry and re-evaluation of patient's condition.  Medications Ordered in ED Medications  ondansetron (ZOFRAN) injection 4 mg (4 mg Intravenous Given 12/27/20 2337)  morphine 2 MG/ML injection 2 mg (2 mg Intravenous Given 12/27/20 2338)    ED Course  I have reviewed the triage vital signs and the nursing notes.  Pertinent labs & imaging results that were available during my care of the patient were reviewed by me and considered in my medical decision making (see chart for details).  MDM Rules/Calculators/A&P Episode of chest pain with tachycardia.  ECG strips from EMS clearly show a wide-complex tachycardia.  Given the fact that he converted to atrial fibrillation, it is most likely that this was ventricular tachycardia.  ECG here shows atrial fibrillation with prolonged QT interval.  Old records were reviewed showing that he is managed for coronary artery disease and history of atrial fibrillation.  He will be given morphine for his pain and will clearly need to be admitted for evaluation of his arrhythmia.  Labs show mild elevation of troponin which will need to be trended.  Also, mild acute kidney injury.  Chest x-ray shows no acute changes.  Heart rhythm has been stable in the ED.  Case is discussed with Dr. Clayton Bibles of cardiology service who agrees to come and evaluate the patient.  Final Clinical Impression(s) / ED Diagnoses Final diagnoses:  Wide-complex tachycardia ([place])  Paroxysmal atrial fibrillation ([place])  Chronic anticoagulation    Rx / DC Orders ED Discharge Orders    None       Delora Fuel, MD [id number]

## 2020-12-28 ENCOUNTER — Inpatient Hospital Stay (HOSPITAL_COMMUNITY): Payer: Medicare Other

## 2020-12-28 DIAGNOSIS — M1990 Unspecified osteoarthritis, unspecified site: Secondary | ICD-10-CM | POA: Diagnosis present

## 2020-12-28 DIAGNOSIS — M109 Gout, unspecified: Secondary | ICD-10-CM | POA: Diagnosis not present

## 2020-12-28 DIAGNOSIS — E785 Hyperlipidemia, unspecified: Secondary | ICD-10-CM | POA: Diagnosis not present

## 2020-12-28 DIAGNOSIS — I214 Non-ST elevation (NSTEMI) myocardial infarction: Secondary | ICD-10-CM | POA: Diagnosis not present

## 2020-12-28 DIAGNOSIS — Z79899 Other long term (current) drug therapy: Secondary | ICD-10-CM | POA: Diagnosis not present

## 2020-12-28 DIAGNOSIS — K219 Gastro-esophageal reflux disease without esophagitis: Secondary | ICD-10-CM | POA: Diagnosis not present

## 2020-12-28 DIAGNOSIS — I472 Ventricular tachycardia, unspecified: Secondary | ICD-10-CM

## 2020-12-28 DIAGNOSIS — Z79890 Hormone replacement therapy (postmenopausal): Secondary | ICD-10-CM | POA: Diagnosis not present

## 2020-12-28 DIAGNOSIS — M549 Dorsalgia, unspecified: Secondary | ICD-10-CM | POA: Diagnosis present

## 2020-12-28 DIAGNOSIS — I5043 Acute on chronic combined systolic (congestive) and diastolic (congestive) heart failure: Secondary | ICD-10-CM | POA: Diagnosis not present

## 2020-12-28 DIAGNOSIS — I130 Hypertensive heart and chronic kidney disease with heart failure and stage 1 through stage 4 chronic kidney disease, or unspecified chronic kidney disease: Secondary | ICD-10-CM | POA: Diagnosis not present

## 2020-12-28 DIAGNOSIS — I255 Ischemic cardiomyopathy: Secondary | ICD-10-CM | POA: Diagnosis present

## 2020-12-28 DIAGNOSIS — I5031 Acute diastolic (congestive) heart failure: Secondary | ICD-10-CM | POA: Diagnosis not present

## 2020-12-28 DIAGNOSIS — J9601 Acute respiratory failure with hypoxia: Secondary | ICD-10-CM | POA: Diagnosis not present

## 2020-12-28 DIAGNOSIS — Z20822 Contact with and (suspected) exposure to covid-19: Secondary | ICD-10-CM | POA: Diagnosis not present

## 2020-12-28 DIAGNOSIS — R079 Chest pain, unspecified: Secondary | ICD-10-CM | POA: Diagnosis not present

## 2020-12-28 DIAGNOSIS — N179 Acute kidney failure, unspecified: Secondary | ICD-10-CM | POA: Diagnosis not present

## 2020-12-28 DIAGNOSIS — I2510 Atherosclerotic heart disease of native coronary artery without angina pectoris: Secondary | ICD-10-CM | POA: Diagnosis not present

## 2020-12-28 DIAGNOSIS — I517 Cardiomegaly: Secondary | ICD-10-CM | POA: Diagnosis not present

## 2020-12-28 DIAGNOSIS — I361 Nonrheumatic tricuspid (valve) insufficiency: Secondary | ICD-10-CM

## 2020-12-28 DIAGNOSIS — E039 Hypothyroidism, unspecified: Secondary | ICD-10-CM | POA: Diagnosis not present

## 2020-12-28 DIAGNOSIS — I25118 Atherosclerotic heart disease of native coronary artery with other forms of angina pectoris: Secondary | ICD-10-CM | POA: Diagnosis not present

## 2020-12-28 DIAGNOSIS — I480 Paroxysmal atrial fibrillation: Secondary | ICD-10-CM

## 2020-12-28 DIAGNOSIS — Z86711 Personal history of pulmonary embolism: Secondary | ICD-10-CM | POA: Diagnosis not present

## 2020-12-28 DIAGNOSIS — I5032 Chronic diastolic (congestive) heart failure: Secondary | ICD-10-CM

## 2020-12-28 DIAGNOSIS — I739 Peripheral vascular disease, unspecified: Secondary | ICD-10-CM | POA: Diagnosis present

## 2020-12-28 DIAGNOSIS — N400 Enlarged prostate without lower urinary tract symptoms: Secondary | ICD-10-CM | POA: Diagnosis present

## 2020-12-28 DIAGNOSIS — I4819 Other persistent atrial fibrillation: Secondary | ICD-10-CM | POA: Diagnosis not present

## 2020-12-28 DIAGNOSIS — I509 Heart failure, unspecified: Secondary | ICD-10-CM

## 2020-12-28 DIAGNOSIS — J9811 Atelectasis: Secondary | ICD-10-CM | POA: Diagnosis not present

## 2020-12-28 DIAGNOSIS — G8929 Other chronic pain: Secondary | ICD-10-CM | POA: Diagnosis present

## 2020-12-28 DIAGNOSIS — N183 Chronic kidney disease, stage 3 unspecified: Secondary | ICD-10-CM | POA: Diagnosis not present

## 2020-12-28 DIAGNOSIS — Z87442 Personal history of urinary calculi: Secondary | ICD-10-CM | POA: Diagnosis not present

## 2020-12-28 DIAGNOSIS — Z7901 Long term (current) use of anticoagulants: Secondary | ICD-10-CM | POA: Diagnosis not present

## 2020-12-28 DIAGNOSIS — I252 Old myocardial infarction: Secondary | ICD-10-CM | POA: Diagnosis not present

## 2020-12-28 DIAGNOSIS — I5030 Unspecified diastolic (congestive) heart failure: Secondary | ICD-10-CM

## 2020-12-28 LAB — ECHOCARDIOGRAM COMPLETE
Area-P 1/2: 2.16 cm2
Calc EF: 39 %
Height: 67 in
S' Lateral: 2.6 cm
Single Plane A2C EF: 42.2 %
Single Plane A4C EF: 31.9 %
Weight: 2592 oz

## 2020-12-28 LAB — BASIC METABOLIC PANEL
Anion gap: 17 — ABNORMAL HIGH (ref 5–15)
Anion gap: 9 (ref 5–15)
BUN: 19 mg/dL (ref 8–23)
BUN: 20 mg/dL (ref 8–23)
CO2: 21 mmol/L — ABNORMAL LOW (ref 22–32)
CO2: 22 mmol/L (ref 22–32)
Calcium: 7.9 mg/dL — ABNORMAL LOW (ref 8.9–10.3)
Calcium: 8.5 mg/dL — ABNORMAL LOW (ref 8.9–10.3)
Chloride: 107 mmol/L (ref 98–111)
Chloride: 96 mmol/L — ABNORMAL LOW (ref 98–111)
Creatinine, Ser: 1.23 mg/dL (ref 0.61–1.24)
Creatinine, Ser: 1.47 mg/dL — ABNORMAL HIGH (ref 0.61–1.24)
GFR, Estimated: 48 mL/min — ABNORMAL LOW (ref >60)
GFR, Estimated: 60 mL/min — ABNORMAL LOW (ref >60)
Glucose, Bld: 256 mg/dL — ABNORMAL HIGH (ref 70–99)
Glucose, Bld: 411 mg/dL — ABNORMAL HIGH (ref 70–99)
Potassium: 4.5 mmol/L (ref 3.5–5.1)
Potassium: 4.7 mmol/L (ref 3.5–5.1)
Sodium: 134 mmol/L — ABNORMAL LOW (ref 135–145)
Sodium: 138 mmol/L (ref 135–145)

## 2020-12-28 LAB — CBC
HCT: 41.7 % (ref 39.0–52.0)
Hemoglobin: 13.2 g/dL (ref 13.0–17.0)
MCH: 31.7 pg (ref 26.0–34.0)
MCHC: 31.7 g/dL (ref 30.0–36.0)
MCV: 100.2 fL — ABNORMAL HIGH (ref 80.0–100.0)
Platelets: 231 10*3/uL (ref 150–400)
RBC: 4.16 MIL/uL — ABNORMAL LOW (ref 4.22–5.81)
RDW: 13.2 % (ref 11.5–15.5)
WBC: 9.3 10*3/uL (ref 4.0–10.5)
nRBC: 0 % (ref 0.0–0.2)

## 2020-12-28 LAB — MRSA PCR SCREENING: MRSA by PCR: NEGATIVE

## 2020-12-28 LAB — TROPONIN I (HIGH SENSITIVITY)
Troponin I (High Sensitivity): 64 ng/L — ABNORMAL HIGH (ref <18)
Troponin I (High Sensitivity): 963 ng/L (ref <18)

## 2020-12-28 LAB — HEMOGLOBIN A1C
Hgb A1c MFr Bld: 6.1 % — ABNORMAL HIGH (ref 4.8–5.6)
Mean Plasma Glucose: 128.37 mg/dL

## 2020-12-28 LAB — BRAIN NATRIURETIC PEPTIDE: B Natriuretic Peptide: 705.8 pg/mL — ABNORMAL HIGH (ref 0.0–100.0)

## 2020-12-28 LAB — APTT: aPTT: 37 seconds — ABNORMAL HIGH (ref 24–36)

## 2020-12-28 LAB — SARS CORONAVIRUS 2 (TAT 6-24 HRS): SARS Coronavirus 2: NEGATIVE

## 2020-12-28 LAB — GLUCOSE, CAPILLARY
Glucose-Capillary: 103 mg/dL — ABNORMAL HIGH (ref 70–99)
Glucose-Capillary: 132 mg/dL — ABNORMAL HIGH (ref 70–99)
Glucose-Capillary: 96 mg/dL (ref 70–99)
Glucose-Capillary: 98 mg/dL (ref 70–99)

## 2020-12-28 LAB — HEPARIN LEVEL (UNFRACTIONATED): Heparin Unfractionated: 1.56 IU/mL — ABNORMAL HIGH (ref 0.30–0.70)

## 2020-12-28 LAB — MAGNESIUM
Magnesium: 1.9 mg/dL (ref 1.7–2.4)
Magnesium: 2.3 mg/dL (ref 1.7–2.4)

## 2020-12-28 MED ORDER — ACETAMINOPHEN 325 MG PO TABS: 650.0000 mg | ORAL_TABLET | ORAL | Status: DC | PRN

## 2020-12-28 MED ORDER — INSULIN ASPART 100 UNIT/ML ~~LOC~~ SOLN
0.0000 [IU] | Freq: Three times a day (TID) | SUBCUTANEOUS | Status: DC
  Administered 2020-12-28 – 2020-12-30 (×2): 2 [IU] via SUBCUTANEOUS

## 2020-12-28 MED ORDER — SODIUM CHLORIDE 0.9 % IV SOLN: 250.0000 mL | INTRAVENOUS | Status: DC | PRN

## 2020-12-28 MED ORDER — ORAL CARE MOUTH RINSE
15.0000 mL | Freq: Two times a day (BID) | OROMUCOSAL | Status: DC
  Administered 2020-12-28 – 2020-12-30 (×5): 15 mL via OROMUCOSAL

## 2020-12-28 MED ORDER — NITROGLYCERIN IN D5W 200-5 MCG/ML-% IV SOLN
0.0000 ug/min | INTRAVENOUS | Status: DC
  Administered 2020-12-28: 10 ug/min via INTRAVENOUS

## 2020-12-28 MED ORDER — LEVOTHYROXINE SODIUM 50 MCG PO TABS: 50.0000 ug | ORAL_TABLET | Freq: Every day | ORAL | Status: DC

## 2020-12-28 MED ORDER — TAMSULOSIN HCL 0.4 MG PO CAPS
0.4000 mg | ORAL_CAPSULE | Freq: Every day | ORAL | Status: DC
  Administered 2020-12-28 – 2020-12-30 (×3): 0.4 mg via ORAL
  Filled 2020-12-28 (×3): qty 1

## 2020-12-28 MED ORDER — ONDANSETRON HCL 4 MG/2ML IJ SOLN
4.0000 mg | Freq: Four times a day (QID) | INTRAMUSCULAR | Status: DC | PRN
Start: 2020-12-28 — End: 2020-12-28

## 2020-12-28 MED ORDER — AMIODARONE HCL IN DEXTROSE 360-4.14 MG/200ML-% IV SOLN
30.0000 mg/h | INTRAVENOUS | Status: DC
  Administered 2020-12-28 (×2): 30 mg/h via INTRAVENOUS
  Filled 2020-12-28 (×2): qty 200

## 2020-12-28 MED ORDER — AMIODARONE HCL IN DEXTROSE 360-4.14 MG/200ML-% IV SOLN
60.0000 mg/h | INTRAVENOUS | Status: AC
  Administered 2020-12-28: 60 mg/h via INTRAVENOUS
  Filled 2020-12-28: qty 200

## 2020-12-28 MED ORDER — AMIODARONE HCL 200 MG PO TABS
400.0000 mg | ORAL_TABLET | Freq: Two times a day (BID) | ORAL | Status: DC
  Administered 2020-12-29 – 2020-12-31 (×6): 400 mg via ORAL
  Filled 2020-12-28 (×6): qty 2

## 2020-12-28 MED ORDER — SODIUM CHLORIDE 0.9% FLUSH
3.0000 mL | Freq: Two times a day (BID) | INTRAVENOUS | Status: DC
  Administered 2020-12-28 – 2020-12-31 (×7): 3 mL via INTRAVENOUS

## 2020-12-28 MED ORDER — FUROSEMIDE 10 MG/ML IJ SOLN
40.0000 mg | Freq: Two times a day (BID) | INTRAMUSCULAR | Status: DC
  Administered 2020-12-28 – 2020-12-29 (×3): 40 mg via INTRAVENOUS
  Filled 2020-12-28 (×2): qty 4

## 2020-12-28 MED ORDER — SODIUM CHLORIDE 0.9% FLUSH: 3.0000 mL | INTRAVENOUS | Status: DC | PRN

## 2020-12-28 MED ORDER — INSULIN ASPART 100 UNIT/ML ~~LOC~~ SOLN: 0.0000 [IU] | Freq: Every day | SUBCUTANEOUS | Status: DC

## 2020-12-28 MED ORDER — CHLORHEXIDINE GLUCONATE CLOTH 2 % EX PADS
6.0000 | MEDICATED_PAD | Freq: Every day | CUTANEOUS | Status: DC
  Administered 2020-12-28 – 2020-12-30 (×3): 6 via TOPICAL

## 2020-12-28 MED ORDER — FUROSEMIDE 10 MG/ML IJ SOLN
INTRAMUSCULAR | Status: AC
  Filled 2020-12-28: qty 4

## 2020-12-28 MED ORDER — NITROGLYCERIN 0.4 MG SL SUBL
0.4000 mg | SUBLINGUAL_TABLET | SUBLINGUAL | Status: DC | PRN
  Administered 2020-12-28: 0.4 mg via SUBLINGUAL

## 2020-12-28 MED ORDER — POTASSIUM CHLORIDE CRYS ER 20 MEQ PO TBCR
20.0000 meq | EXTENDED_RELEASE_TABLET | Freq: Two times a day (BID) | ORAL | Status: DC
  Administered 2020-12-28 – 2020-12-31 (×7): 20 meq via ORAL
  Filled 2020-12-28 (×7): qty 1

## 2020-12-28 MED ORDER — HEPARIN (PORCINE) 25000 UT/250ML-% IV SOLN
1150.0000 [IU]/h | INTRAVENOUS | Status: DC
  Administered 2020-12-28 (×2): 1250 [IU]/h via INTRAVENOUS
  Administered 2020-12-28: 1000 [IU]/h via INTRAVENOUS
  Administered 2020-12-29: 1250 [IU]/h via INTRAVENOUS
  Filled 2020-12-28 (×3): qty 250

## 2020-12-28 MED ORDER — ISOSORBIDE MONONITRATE ER 30 MG PO TB24
30.0000 mg | ORAL_TABLET | Freq: Every day | ORAL | Status: DC
Start: 2020-12-28 — End: 2020-12-31
  Administered 2020-12-28 – 2020-12-31 (×4): 30 mg via ORAL
  Filled 2020-12-28 (×4): qty 1

## 2020-12-28 MED ORDER — AMIODARONE HCL 200 MG PO TABS: 400.0000 mg | ORAL_TABLET | Freq: Every day | ORAL | Status: DC

## 2020-12-28 MED ORDER — MAGNESIUM SULFATE 2 GM/50ML IV SOLN
2.0000 g | Freq: Once | INTRAVENOUS | Status: AC
  Administered 2020-12-28: 2 g via INTRAVENOUS
  Filled 2020-12-28: qty 50

## 2020-12-28 MED ORDER — ONDANSETRON HCL 4 MG/2ML IJ SOLN
4.0000 mg | Freq: Four times a day (QID) | INTRAMUSCULAR | Status: DC | PRN
Start: 2020-12-28 — End: 2020-12-30

## 2020-12-28 MED ORDER — ACETAMINOPHEN 325 MG PO TABS
650.0000 mg | ORAL_TABLET | ORAL | Status: DC | PRN
  Administered 2020-12-28 – 2020-12-29 (×2): 650 mg via ORAL
  Filled 2020-12-28 (×2): qty 2

## 2020-12-28 MED ORDER — LEVOTHYROXINE SODIUM 50 MCG PO TABS
50.0000 ug | ORAL_TABLET | ORAL | Status: DC
  Administered 2020-12-29 – 2020-12-31 (×2): 50 ug via ORAL
  Filled 2020-12-28 (×2): qty 1

## 2020-12-28 MED ORDER — HYDROCODONE-ACETAMINOPHEN 5-325 MG PO TABS
1.0000 | ORAL_TABLET | Freq: Four times a day (QID) | ORAL | Status: DC | PRN
  Administered 2020-12-30: 1 via ORAL
  Filled 2020-12-28: qty 1

## 2020-12-28 NOTE — ED Notes (Signed)
Date and time results received: 12/28/20 0610 (use smartphrase ".now" to insert current time)  Test: Troponin Critical Value: 963  Name of Provider Notified: Clayton Bibles, MD  Orders Received? Or Actions Taken?:

## 2020-12-28 NOTE — ED Provider Notes (Addendum)
  I was called to the bedside by the RN for patient's abrupt change in status.  Patient dyspneic, tachypneic, hypoxic, with accessory muscle use. Airway clear.  Rales in all fields.  Patient continues to complain of right-sided chest pain, which he states is not new.  Blood pressure also elevated to 191/87.  RN had also contacted admitting team (cardiology) and prior to my contact with the patient orders were received over the phone by Dr. Katharina Caper.   Nitroglycerin drip and BiPAP ordered and initiated with improvement in patient's symptoms.  Further management of the patient was left in the hands of Dr. Katharina Caper upon his arrival at the bedside.  .Critical Care Performed by: Lorayne Bender, PA-C Authorized by: Lorayne Bender, PA-C   Critical care provider statement:    Critical care time (minutes):  15   Critical care time was exclusive of:  Separately billable procedures and treating other patients   Critical care was necessary to treat or prevent imminent or life-threatening deterioration of the following conditions:  Respiratory failure and cardiac failure   Critical care was time spent personally by me on the following activities:  Re-evaluation of patient's condition, pulse oximetry, examination of patient, evaluation of patient's response to treatment, obtaining history from patient or surrogate, development of treatment plan with patient or surrogate and discussions with consultants   Care discussed with: admitting provider       Lorayne Bender, PA-C 12/28/20 [place], Helane Gunther, PA-C 12/28/20 1357    Varney Biles, MD 12/28/20 1400

## 2020-12-28 NOTE — Progress Notes (Signed)
    Ryan [name] is a 80 year old male with a history of coronary artery disease and an ischemic cardiomyopathy.  He also has paroxysmal atrial fibrillation, chronic kidney disease, PAD, hypothyroidism, hypertension.  He developed "heart racing" last night.  EMS was called and he was found to have sustained monomorphic VT.  He had chest pressure with the tachycardia but did not have any chest pain or pressure prior to the tachycardia.  He was defibrillated in the ambulance on the way here.  He currently is pain-free.  Troponin levels  are elevated.  He has already been seen by Dr. Clayton Bibles this am  BP 134/70 (BP Location: Left Arm)   Pulse 65   Temp 97.6 F (36.4 C) (Oral)   Resp 16   Ht 5\' 7"  (1.702 m)   Wt 73.5 kg   SpO2 98%   BMI 25.37 kg/m  Lungs clear, Cor:  RR Abd:  Soft n Ext: no edema   1.  Monomorphic VT:  ? Due to ischemia.  Current pain free Will have EP see him toda  2. CAD:  NSTEMI:    Currently pain free.  Has elevated trop in the setting of sustained VT.   Plan is for cath on Monday  If he becomes unstable, we may need to cath him sooner.     Mertie Moores, MD  12/28/2020 8:54 AM    [address],  [place], [place]  [address] Phone: [phone]; Fax: [phone]

## 2020-12-28 NOTE — Progress Notes (Signed)
     Patient was fairly stable when I rounded on him earlier this morning.  He developed sudden respiratory distress.  He has significant rales in both lungs.  I was notified by Jodi Marble, RN that the patient was having trouble.  We started Lasix, IV nitroglycerin and BiPAP.  Foley catheter was placed. I reassessed the patient down in the emergency room.  He does not have any ST segment elevation.  He does have some very minimal ST segment depression which I suspect is due to his known diffuse coronary artery disease. The patient was also given sublingual nitroglycerin.  After 20 or so minutes of BiPAP and nitroglycerin, the patient started putting out urine and his breathing better.  We will continue BiPAP.  He will need to go to the ICU instead of telemetry. [facility] will be admitted to [facility].  Critical care time 1 Hr.    Mertie Moores, MD  12/28/2020 11:35 AM    [address],  [place], [place]  [address] Phone: [phone]; Fax: [phone]

## 2020-12-28 NOTE — H&P (Signed)
Cardiology Admission History and Physical:   Patient ID: Ryan [name] MRN: [id number]; DOB: [birth year]   Admission date: 12/27/2020  Primary Care Provider: Marton Redwood, MD [facility] Cardiologist: Mertie Moores, MD  [facility] Electrophysiologist:  None   Chief Complaint:  "My heart just started racing"  Patient Profile:   Ryan [name] is a 80 y.o. male with hx of CAD s/p 4v CABG (2016, LIMA--> LAD, SVG (y) --> D1, SVG --> PDA), paroxysmal atrial fibrillation, CKD III, PAD,  hypothyroidism, essential hypertension, and BPH who presented to the ED this evening after developing sudden palpitations and chest discomfort. When paramedics arrived he was found to be in monomorphic VT and was cardioverted in the field before being brought to the ED.   History of Present Illness:   Ryan [name] has a long history of coronary disease.  He tells me that his first heart attack was when he was 33.  In 2016 he reported worsening chest pain and underwent left heart catheterization.  At that time he was noted to have severe three-vessel coronary disease and was referred for CABG.  He underwent four-vessel CABG in 2016.  In the year since he has had persistent angina and last underwent left heart catheterization in February 2020.  His LIMA was patent but all vein grafts were occluded.  There were no suitable targets for PCI in his antianginal regimen was uptitrated.  Ryan [name] also has a history of paroxysmal atrial fibrillation and has remained on amiodarone for rhythm control and Eliquis for anticoagulation.  Recently, Ryan [name] tells me he has been doing reasonably well.  He has a very large yard, and is able to walk the yard and rake/blow leaves without any significant chest pain or shortness of breath.  He does have occasional episodes of what he describes as chest pain although he attributes this to "gas."  These episodes happen a few times a month and are typically relieved by  belching and flatus.  About 2 weeks ago, he had an episode where he fell near his garage.  He reports tripping with no prodromal symptoms, and reports remembering everything before and after his fall.  His right shoulder and right ribs have been hurting since although this has slowly been getting better.  This evening Ryan [name] was lying in bed watching television and suddenly had a sensation that his heart was racing.  He has never felt anything like this before.  This was associated with significant chest pressure that was very different than the typical chest pain he developed.  He became nauseated and felt like he would feel better if he were able to vomit.  He attempted to induce vomiting multiple times in the bathroom but was unsuccessful.  The palpitations and chest pressure persisted, so he told his wife to call 911.  When EMS arrived he was noted to be in a wide-complex tachycardia.  He was successfully cardioverted in the field with 1 shock.  Reviewing his EMS strips, Ryan [name] was in a regular, wide complex tachycardia with a RBBB morphology at a rate of around 180. There was positive concordance across the precordium with an R wave in aVR, all consistent with VT. He initially converted to sinus but has remained in rate controlled atrial fibrillation in the emergency department.   Past Medical History:  Diagnosis Date  . Arthritis   . CAD (coronary artery disease)   . Carotid artery occlusion   . Cataract    Bil eyes/worse  in left eye  . CHF (congestive heart failure) ([place])   . Chronic back pain   . DVT (deep venous thrombosis) ([place])   . Dysrhythmia   . Enlarged prostate    takes Rapaflo daily  . GERD (gastroesophageal reflux disease)    occasional  . History of colon polyps   . History of gout    has colchicine prn  . History of kidney stones   . Hyperlipidemia    takes Crestor daily  . Hypertension    takes Amlodipine daily  . Hypothyroidism   . Myocardial infarction  ([place])   . Peripheral vascular disease ([place])   . Pulmonary emboli ([place]) 03/20/2015   elevated d-dimer, intermediate V/Q study, atypical chest pain and SOB. Start on Xarelto 20mg  BID for 3 month  . Rapid atrial fibrillation ([place])   . Renal insufficiency   . Shortness of breath dyspnea   . Urinary frequency   . Urinary urgency     Past Surgical History:  Procedure Laterality Date  . APPENDECTOMY    . BACK SURGERY     5 times  . big toe surgery    . CARDIAC CATHETERIZATION     2010    dr Acie Fredrickson  . cataract surgery     left eye  . CHOLECYSTECTOMY N/A 07/27/2016   Procedure: LAPAROSCOPIC CHOLECYSTECTOMY;  Surgeon: Mickeal Skinner, MD;  Location: [place];  Service: General;  Laterality: N/A;  . COLONOSCOPY    . CORONARY ARTERY BYPASS GRAFT N/A 04/05/2015   Procedure: CORONARY ARTERY BYPASS GRAFTING (CABG)X4 LIMA-LAD; SVG-DIAG1-DIAG2; SVG-PD;  Surgeon: [name], MD;  Location: [place];  Service: Open Heart Surgery;  Laterality: N/A;  . CORONARY/GRAFT ANGIOGRAPHY N/A 04/22/2018   Procedure: CORONARY/GRAFT ANGIOGRAPHY;  Surgeon: Martinique, Peter M, MD;  Location: [place] CV LAB;  Service: Cardiovascular;  Laterality: N/A;  . CYSTOSCOPY    . ENDARTERECTOMY Left 04/24/2016   Procedure: ENDARTERECTOMY LEFT CAROTID;  Surgeon: Mal Misty, MD;  Location: [place];  Service: Vascular;  Laterality: Left;  . EYE SURGERY    . FEMORAL ARTERY - POPLITEAL ARTERY BYPASS GRAFT    . JOINT REPLACEMENT     shoulder  . LEFT HEART CATHETERIZATION WITH CORONARY ANGIOGRAM N/A 04/03/2015   Procedure: LEFT HEART CATHETERIZATION WITH CORONARY ANGIOGRAM;  Surgeon: Troy Sine, MD;  Location: [facility] CATH LAB;  Service: Cardiovascular;  Laterality: N/A;  . LUMBAR LAMINECTOMY  01/06/2013   Procedure: MICRODISCECTOMY LUMBAR LAMINECTOMY;  Surgeon: Marybelle Killings, MD;  Location: [place];  Service: Orthopedics;  Laterality: N/A;  L3-4 decompression  . LUMBAR LAMINECTOMY/DECOMPRESSION MICRODISCECTOMY  02/12/2012    Procedure: LUMBAR LAMINECTOMY/DECOMPRESSION MICRODISCECTOMY;  Surgeon: Floyce Stakes, MD;  Location: [place] NEURO ORS;  Service: Neurosurgery;  Laterality: N/A;  Lumbar four-five laminectomy  . PATCH ANGIOPLASTY Left 04/24/2016   Procedure: LEFT CAROTID ARTERY PATCH ANGIOPLASTY;  Surgeon: Mal Misty, MD;  Location: [place];  Service: Vascular;  Laterality: Left;  . RIGHT HEART CATH AND CORONARY/GRAFT ANGIOGRAPHY N/A 01/30/2019   Procedure: RIGHT HEART CATH AND CORONARY/GRAFT ANGIOGRAPHY;  Surgeon: Troy Sine, MD;  Location: [place] CV LAB;  Service: Cardiovascular;  Laterality: N/A;  . STERIOD INJECTION Right 01/09/2014   Procedure: STEROID INJECTION;  Surgeon: Mcarthur Rossetti, MD;  Location: [place];  Service: Orthopedics;  Laterality: Right;  . TEE WITHOUT CARDIOVERSION N/A 04/05/2015   Procedure: TRANSESOPHAGEAL ECHOCARDIOGRAM (TEE);  Surgeon: [name], MD;  Location: [place];  Service: Open Heart Surgery;  Laterality: N/A;  . TOTAL HIP ARTHROPLASTY Left 01/09/2014   DR Ninfa [place]  . TOTAL HIP ARTHROPLASTY Left 01/09/2014   Procedure: LEFT TOTAL HIP ARTHROPLASTY ANTERIOR APPROACH and Steroid Injection Right hip;  Surgeon: Mcarthur Rossetti, MD;  Location: [place];  Service: Orthopedics;  Laterality: Left;  . TOTAL HIP ARTHROPLASTY Right 08/15/2019  . TOTAL HIP ARTHROPLASTY Right 08/15/2019   Procedure: RIGHT TOTAL HIP ARTHROPLASTY ANTERIOR APPROACH;  Surgeon: Mcarthur Rossetti, MD;  Location: [place];  Service: Orthopedics;  Laterality: Right;     Medications Prior to Admission: Prior to Admission medications   Medication Sig Start Date End Date Taking? Authorizing Provider  allopurinol (ZYLOPRIM) 100 MG tablet Take 100 mg by mouth daily as needed (gout). 05/29/20  Yes [provider]  amiodarone (PACERONE) 200 MG tablet Take 0.5 tablets (100 mg total) by mouth daily. 07/29/20  Yes [name], [name], MD  amLODipine (NORVASC) 2.5 MG tablet Take 1 tablet (2.5 mg  total) by mouth daily. 07/29/20  Yes [name], [name], MD  bisoprolol (ZEBETA) 5 MG tablet Take 0.5 tablets (2.5 mg total) by mouth daily. 05/10/20  Yes [name], [name], MD  ELIQUIS 5 MG TABS tablet TAKE 1 TABLET(5 MG) BY MOUTH TWICE DAILY Patient taking differently: Take 5 mg by mouth 2 (two) times daily. 09/02/20  Yes [name], [name], MD  Evolocumab (REPATHA SURECLICK) [id number] MG/ML SOAJ Inject 1 pen into the skin every 14 (fourteen) days. 08/30/20  Yes [name], [name], MD  furosemide (LASIX) 40 MG tablet Take 40 mg by mouth at bedtime. 09/27/20  Yes [provider]  HYDROcodone-acetaminophen (NORCO/VICODIN) 5-325 MG tablet Take 1 tablet by mouth every 6 (six) hours as needed for moderate pain.   Yes [provider]  isosorbide mononitrate (IMDUR) 30 MG 24 hr tablet Take 1 tablet (30 mg total) by mouth daily. 01/18/20  Yes [name], [name], MD  levothyroxine (SYNTHROID) 50 MCG tablet Take 50 mcg by mouth See admin instructions. Every other night 07/17/19  Yes [provider]  nitroGLYCERIN (NITROSTAT) 0.4 MG SL tablet PLACE 1 TABLET UNDER THE TONGUE EVERY 5 MINUTES AS NEEDED FOR CHEST PAIN OR SHORTNESS OF BREATH Patient taking differently: Place 0.4 mg under the tongue every 5 (five) minutes as needed for chest pain. 11/21/20  Yes [name], [name], MD  oxyCODONE (OXY IR/ROXICODONE) 5 MG immediate release tablet Take 1-2 tablets (5-10 mg total) by mouth every 6 (six) hours as needed for moderate pain (pain score 4-6). 08/29/19  Yes Pete Pelt, PA-C  potassium chloride SA (KLOR-CON) 20 MEQ tablet Take 2 tablets (40 mEq total) by mouth daily. Patient taking differently: Take 20 mEq by mouth 2 (two) times daily. 05/10/20  Yes [name], [name], MD  tamsulosin (FLOMAX) 0.4 MG CAPS capsule Take 0.4 mg by mouth at bedtime. 03/01/17  Yes [provider]  traMADol (ULTRAM) 50 MG tablet Take 1 tablet (50 mg total) by mouth 2 (two) times daily. Patient taking differently: Take 50 mg  by mouth every 6 (six) hours as needed for moderate pain. 07/03/20  Yes Wallene Huh, DPM     Allergies:    Allergies  Allergen Reactions  . Zetia [Ezetimibe] Other (See Comments)    Myalgias   . Codeine Nausea And Vomiting       . Unithroid [Levothyroxine Sodium] Other (See Comments)    Caused blurry vision per pt    Social History:   Social History   Socioeconomic History  .  Marital status: Married    Spouse name: Not on file  . Number of children: Not on file  . Years of education: Not on file  . Highest education level: Not on file  Occupational History  . Not on file  Tobacco Use  . Smoking status: Former Smoker    Types: Cigarettes    Quit date: 02/04/1987    Years since quitting: 33.9  . Smokeless tobacco: Former Systems developer    Types: Chew    Quit date: 07/20/2009  . Tobacco comment: quit 35+yrs ago  Vaping Use  . Vaping Use: Never used  Substance and Sexual Activity  . Alcohol use: No    Alcohol/week: 0.0 standard drinks  . Drug use: No  . Sexual activity: Not Currently  Other Topics Concern  . Not on file  Social History Narrative  . Not on file   Social Determinants of Health   Financial Resource Strain: Not on file  Food Insecurity: Not on file  Transportation Needs: Not on file  Physical Activity: Not on file  Stress: Not on file  Social Connections: Not on file  Intimate Partner Violence: Not on file    Family History:   The patient's family history includes Diabetes in his son; Heart attack in his father and sister; Heart disease in his father and sister; Hypertension in his mother and sister.    ROS:  Please see the history of present illness.  All other ROS reviewed and negative.     Physical Exam/Data:   Vitals:   12/28/20 0015 12/28/20 0045 12/28/20 0115 12/28/20 0231  BP: 107/75 (!) 121/53 136/69 (!) 151/61  Pulse: (!) 45 (!) 34 88 83  Resp: 14 14 19 20   Temp:    97.9 F (36.6 C)  TempSrc:    Oral  SpO2: 98% 97% 98% 93%  Weight:       Height:       No intake or output data in the 24 hours ending 12/28/20 0252 Last 3 Weights 12/27/2020 07/29/2020 01/18/2020  Weight (lbs) 162 lb 161 lb 12.8 oz 169 lb  Weight (kg) 73.483 kg 73.392 kg 76.658 kg     Body mass index is 25.37 kg/m.  General:  Well nourished, well developed, in no acute distress HEENT: normal Lymph: no adenopathy Neck: no JVD Endocrine:  No thryomegaly Vascular: No carotid bruits; FA pulses 1+ on the R, 2+ on the L Cardiac:  normal S1, S2; RRR; no murmur  Lungs:  clear to auscultation bilaterally, no wheezing, rhonchi or rales  Abd: soft, nontender, no hepatomegaly  Ext: no edema Musculoskeletal:  No deformities, BUE and BLE strength normal and equal Skin: warm and dry  Neuro:  CNs 2-12 intact, no focal abnormalities noted Psych:  Normal affect   EKG:  The ECG that was done on 12/28/19 was personally reviewed and demonstrates rate controlled atrial fibrillation, low voltage QRS, near incomplete LBBB  Relevant CV Studies: LHC (01/2019):  Ost 1st Diag lesion is 80% stenosed.  Prox RCA to Mid RCA lesion is 100% stenosed.  Ost Ramus to Ramus lesion is 90% stenosed.  LIMA and is normal in caliber.  The graft exhibits no disease.  SVG.  Origin to Prox Graft lesion is 100% stenosed.  SVG.  Origin to Prox Graft lesion is 100% stenosed.  1st Diag lesion is 95% stenosed.  Ost LAD to Prox LAD lesion is 50% stenosed.  Ost Cx to Prox Cx lesion is 65% stenosed.  Mid Cx to  Dist Cx lesion is 85% stenosed.  Prox LAD lesion is 70% stenosed.  Prox LAD to Mid LAD lesion is 50% stenosed.  TTE (01/2019): 1. The left ventricle has low normal systolic function of 51-02%. The  cavity size was normal. There is no increased left ventricular wall  thickness. Left ventricular diastology could not be evaluated due to  indeterminent diastolic function. Elevated  left ventricular end-diastolic pressure The E/e' is 71.  2. The right ventricle has mildly  reduced systolic function. The cavity  was mildly enlarged. There is no increase in right ventricular wall  thickness. Right ventricular systolic pressure could not be assessed.  3. The tricuspid valve is normal in structure. Tricuspid valve  regurgitation is mild-moderate.  4. There is moderate calcification of the aortic valve without aortic  stenosis.  5. The mitral valve is normal in structure. There is mild thickening and  mild calcification.   Laboratory Data:  High Sensitivity Troponin:   Recent Labs  Lab 12/27/20 2330  TROPONINIHS 64*      Chemistry Recent Labs  Lab 12/27/20 2330  NA 138  K 4.7  CL 107  CO2 22  GLUCOSE 256*  BUN 20  CREATININE 1.47*  CALCIUM 8.5*  GFRNONAA 48*  ANIONGAP 9    No results for input(s): PROT, ALBUMIN, AST, ALT, ALKPHOS, BILITOT in the last 168 hours. Hematology Recent Labs  Lab 12/27/20 2330  WBC 13.1*  RBC 4.15*  HGB 13.5  HCT 41.6  MCV 100.2*  MCH 32.5  MCHC 32.5  RDW 13.2  PLT 248   BNPNo results for input(s): BNP, PROBNP in the last 168 hours.  DDimer No results for input(s): DDIMER in the last 168 hours.   Radiology/Studies:  DG Chest Port 1 View  Result Date: 12/28/2020 CLINICAL DATA:  Chest pain. EXAM: PORTABLE CHEST 1 VIEW COMPARISON:  Radiograph 01/27/2019 FINDINGS: Median sternotomy and CABG. Mild cardiomegaly. Aortic atherosclerosis. No pulmonary edema. Mild chronic interstitial coarsening. Streaky opacities at the left lung base. No pleural fluid or pneumothorax. Postsurgical change of the left shoulder. Overlying monitoring devices partially obscure detailed assessment. IMPRESSION: 1. Streaky left basilar opacities, favor atelectasis. 2. Mild cardiomegaly post CABG. Electronically Signed   By: Keith Rake M.D.   On: 12/28/2020 00:08     Assessment and Plan:   #Ventricular Tachycardia  - Sudden onset of palpitations and chest pressure this evening. EMS strips with WCT at a rate of around 180,  consistent with monomorphic VT. Converted to sinus (and later rate controlled atrial fibrillation)  - Suspect this is likely scar mediated although ischemia could contributing. First hsTrop slightly elevated. Waiting on repeat. No significant electrolyte abnormalities. - Ideally would get cMRI to assess scar burden but has previous L shoulder replacement  - Repeat echo in AM  - May need repeat LHC to r/o progressive CAD (although absent critical circ disease not sure there are any targets here). Will keep NPO just in case.  - Start IV amiodarone  - Repeat TFTs - If no reversible causes will need ICD for secondary prevention   #CAD - Stable angina preceding tonight's event. Did develop ischemic CP during VT, but this is not surprising given his extensive CAD  - Waiting on repeat troponin but suspect he just has myocardial injury from above  - Loaded with ASA by EMS. If troponin flat, can keep off ASA (as he was prior to admission) given he will remain on eliquis as an outpatient  - No statin (myalgias).  On repatha as an outpatient - Cont Imdur. Holding bisoprolol/amlodipine for now   #Paroxysmal Atrial Fibrillation: - Rate controlled in the 70s in the ED  - Start heparin ggt in the event he will need any invasive procedures - Amiodarone as above   #CKD III - Cr near baseline   #Hypothyroidism  - Cont synthroid - Repeat TFTs  #Leukocytosis: - No signs or symptoms of infection  - Suspect this may be reactive to above   CHA2DS2-VASc Score = 3  This indicates a 3.2% annual risk of stroke. The patient's score is based upon: CHF History: No HTN History: No Diabetes History: No Stroke History: No Vascular Disease History: Yes Age Score: 2 Gender Score: 0   Severity of Illness: The appropriate patient status for this patient is INPATIENT. Inpatient status is judged to be reasonable and necessary in order to provide the required intensity of service to ensure the patient's safety.  The patient's presenting symptoms, physical exam findings, and initial radiographic and laboratory data in the context of their chronic comorbidities is felt to place them at high risk for further clinical deterioration. Furthermore, it is not anticipated that the patient will be medically stable for discharge from the hospital within 2 midnights of admission. The following factors support the patient status of inpatient.   " The patient's presenting symptoms include chest pain, palpitations. " The worrisome physical exam findings include chest pain. " The initial radiographic and laboratory data are worrisome because of ventricular tachycardia . " The chronic co-morbidities include coronary artery disease.  * I certify that at the point of admission it is my clinical judgment that the patient will require inpatient hospital care spanning beyond 2 midnights from the point of admission due to high intensity of service, high risk for further deterioration and high frequency of surveillance required.*    For questions or updates, please contact [place] Please consult [email or web address] for contact info under   Signed, Ronaldo Miyamoto, MD  12/28/2020 2:52 AM

## 2020-12-28 NOTE — Progress Notes (Signed)
Echocardiogram 2D Echocardiogram has been performed.  [place] 12/28/2020, 1:56 PM

## 2020-12-28 NOTE — ED Notes (Signed)
Two unsuccessful attempts at drawing morning labs.

## 2020-12-28 NOTE — ED Notes (Signed)
Patient desat to 87%% RA. 2LO2 applied to patient per RN. sats improved to 96%

## 2020-12-28 NOTE — ED Notes (Signed)
Troponin resulted of 963. [place] made aware through secure message due to no pager. EKG taken at this time. Pt has No complaints.

## 2020-12-28 NOTE — Progress Notes (Signed)
[place] for heparin Indication: atrial fibrillation  Allergies  Allergen Reactions  . Zetia [Ezetimibe] Other (See Comments)    Myalgias   . Codeine Nausea And Vomiting       . Unithroid [Levothyroxine Sodium] Other (See Comments)    Caused blurry vision per pt    Patient Measurements: Height: 5\' 7"  (170.2 cm) Weight: 73.5 kg (162 lb 0.6 oz) IBW/kg (Calculated) : 66.1  Heparin dosing weight: 73.5 kg  Vital Signs: Temp: 97.7 F (36.5 C) (01/08 1400) Temp Source: Axillary (01/08 1400) BP: 118/67 (01/08 1430) Pulse Rate: 44 (01/08 1430)  Labs: Recent Labs    12/27/20 2330 12/28/20 0401 12/28/20 0816 12/28/20 1404  HGB 13.5  --  13.2  --   HCT 41.6  --  41.7  --   PLT 248  --  231  --   APTT  --   --   --  37*  CREATININE 1.47*  --  1.23  --   TROPONINIHS 64* 963*  --   --     Estimated Creatinine Clearance: 45.5 mL/min (by C-G formula based on SCr of 1.23 mg/dL).   Medical History: Past Medical History:  Diagnosis Date  . Arthritis   . CAD (coronary artery disease)   . Carotid artery occlusion   . Cataract    Bil eyes/worse in left eye  . CHF (congestive heart failure) ([place])   . Chronic back pain   . DVT (deep venous thrombosis) ([place])   . Dysrhythmia   . Enlarged prostate    takes Rapaflo daily  . GERD (gastroesophageal reflux disease)    occasional  . History of colon polyps   . History of gout    has colchicine prn  . History of kidney stones   . Hyperlipidemia    takes Crestor daily  . Hypertension    takes Amlodipine daily  . Hypothyroidism   . Myocardial infarction ([place])   . Peripheral vascular disease ([place])   . Pulmonary emboli ([place]) 03/20/2015   elevated d-dimer, intermediate V/Q study, atypical chest pain and SOB. Start on Xarelto 20mg  BID for 3 month  . Rapid atrial fibrillation ([place])   . Renal insufficiency   . Shortness of breath dyspnea   . Urinary frequency   . Urinary urgency      Assessment: 80yo male called EMS for CP, found to be in [place] >> cardioverted to Afib, to transition from apixaban to heparin during further cardiac w/u; last dose of Eliquis taken 1/7 at 1600.  Initial aPTT subtherapeutic at 37 seconds after starting drip rate 1000 units/hr. Heparin level falsely elevated given recent Eliquis. Will dose based off aPTT until levels correlate. CBC wnl and stable. No overt bleeding or infusion issues per discussion with nursing.  Goal of Therapy:  Heparin level 0.3-0.7 units/ml aPTT 66-102 seconds Monitor platelets by anticoagulation protocol: Yes   Plan:  Increase IV heparin to 1250 units/hr Check 8-hr aPTT Monitor daily aPTT/HL, CBC, and s/sx bleeding  Richardine Service, PharmD, BCPS PGY2 Cardiology Pharmacy Resident Phone: [phone] 12/28/2020  3:02 PM  Please check [email or web address] for unit-specific pharmacy phone numbers.

## 2020-12-28 NOTE — ED Notes (Signed)
Pt placed on BiPap

## 2020-12-28 NOTE — Progress Notes (Signed)
ANTICOAGULATION CONSULT NOTE - Initial Consult  Pharmacy Consult for heparin Indication: atrial fibrillation  Allergies  Allergen Reactions  . Zetia [Ezetimibe] Other (See Comments)    Myalgias   . Codeine Nausea And Vomiting       . Unithroid [Levothyroxine Sodium] Other (See Comments)    Caused blurry vision per pt    Patient Measurements: Height: 5\' 7"  (170.2 cm) Weight: 73.5 kg (162 lb) IBW/kg (Calculated) : 66.1  Vital Signs: Temp: 97.9 F (36.6 C) (01/08 0231) Temp Source: Oral (01/08 0231) BP: 151/61 (01/08 0231) Pulse Rate: 83 (01/08 0231)  Labs: Recent Labs    12/27/20 2330  HGB 13.5  HCT 41.6  PLT 248  CREATININE 1.47*  TROPONINIHS 64*    Estimated Creatinine Clearance: 38.1 mL/min (A) (by C-G formula based on SCr of 1.47 mg/dL (H)).   Medical History: Past Medical History:  Diagnosis Date  . Arthritis   . CAD (coronary artery disease)   . Carotid artery occlusion   . Cataract    Bil eyes/worse in left eye  . CHF (congestive heart failure) ([place])   . Chronic back pain   . DVT (deep venous thrombosis) ([place])   . Dysrhythmia   . Enlarged prostate    takes Rapaflo daily  . GERD (gastroesophageal reflux disease)    occasional  . History of colon polyps   . History of gout    has colchicine prn  . History of kidney stones   . Hyperlipidemia    takes Crestor daily  . Hypertension    takes Amlodipine daily  . Hypothyroidism   . Myocardial infarction ([place])   . Peripheral vascular disease ([place])   . Pulmonary emboli ([place]) 03/20/2015   elevated d-dimer, intermediate V/Q study, atypical chest pain and SOB. Start on Xarelto 20mg  BID for 3 month  . Rapid atrial fibrillation ([place])   . Renal insufficiency   . Shortness of breath dyspnea   . Urinary frequency   . Urinary urgency     Assessment: 80yo male called EMS for CP, found to be in [place] >> cardioverted to Afib, to transition from apixaban to heparin during further cardiac w/u; last dose of  Eliquis taken 1/7 at 1600.  Goal of Therapy:  Heparin level 0.3-0.7 units/ml aPTT 66-102 seconds Monitor platelets by anticoagulation protocol: Yes   Plan:  At 4a will start heparin gtt at 1000 units/hr and monitor heparin levels, aPTT (while Eliquis affects anti-Xa), and CBC.  Wynona Neat, PharmD, BCPS  12/28/2020,2:36 AM

## 2020-12-28 NOTE — Progress Notes (Signed)
  Amiodarone Drug - Drug Interaction Consult Note  Recommendations: No specific recommendations for drug interactions currently, but pt did have prolonged QTc on ECG after cardioversion >> monitor QTc.  Amiodarone is metabolized by the cytochrome P450 system and therefore has the potential to cause many drug interactions. Amiodarone has an average plasma half-life of 50 days (range 20 to 100 days).   There is potential for drug interactions to occur several weeks or months after stopping treatment and the onset of drug interactions may be slow after initiating amiodarone.   []  Statins: Increased risk of myopathy. Simvastatin- restrict dose to 20mg  daily. Other statins: counsel patients to report any muscle pain or weakness immediately.  []  Anticoagulants: Amiodarone can increase anticoagulant effect. Consider warfarin dose reduction. Patients should be monitored closely and the dose of anticoagulant altered accordingly, remembering that amiodarone levels take several weeks to stabilize.  []  Antiepileptics: Amiodarone can increase plasma concentration of phenytoin, the dose should be reduced. Note that small changes in phenytoin dose can result in large changes in levels. Monitor patient and counsel on signs of toxicity.  []  Beta blockers: increased risk of bradycardia, AV block and myocardial depression. Sotalol - avoid concomitant use.  []   Calcium channel blockers (diltiazem and verapamil): increased risk of bradycardia, AV block and myocardial depression.  []   Cyclosporine: Amiodarone increases levels of cyclosporine. Reduced dose of cyclosporine is recommended.  []  Digoxin dose should be halved when amiodarone is started.  []  Diuretics: increased risk of cardiotoxicity if hypokalemia occurs.  []  Oral hypoglycemic agents (glyburide, glipizide, glimepiride): increased risk of hypoglycemia. Patient's glucose levels should be monitored closely when initiating amiodarone therapy.    []  Drugs that prolong the QT interval:  Torsades de pointes risk may be increased with concurrent use - avoid if possible.  Monitor QTc, also keep magnesium/potassium WNL if concurrent therapy can't be avoided. Marland Kitchen Antibiotics: e.g. fluoroquinolones, erythromycin. . Antiarrhythmics: e.g. quinidine, procainamide, disopyramide, sotalol. . Antipsychotics: e.g. phenothiazines, haloperidol.  . Lithium, tricyclic antidepressants, and methadone.  Wynona Neat, PharmD, BCPS  12/28/2020 2:44 AM

## 2020-12-29 ENCOUNTER — Encounter (HOSPITAL_COMMUNITY): Payer: Self-pay | Admitting: Cardiovascular Disease

## 2020-12-29 DIAGNOSIS — I214 Non-ST elevation (NSTEMI) myocardial infarction: Secondary | ICD-10-CM

## 2020-12-29 DIAGNOSIS — I5031 Acute diastolic (congestive) heart failure: Secondary | ICD-10-CM | POA: Diagnosis not present

## 2020-12-29 DIAGNOSIS — I472 Ventricular tachycardia: Secondary | ICD-10-CM | POA: Diagnosis not present

## 2020-12-29 LAB — BASIC METABOLIC PANEL
Anion gap: 11 (ref 5–15)
BUN: 20 mg/dL (ref 8–23)
CO2: 25 mmol/L (ref 22–32)
Calcium: 8.9 mg/dL (ref 8.9–10.3)
Chloride: 101 mmol/L (ref 98–111)
Creatinine, Ser: 1.51 mg/dL — ABNORMAL HIGH (ref 0.61–1.24)
GFR, Estimated: 47 mL/min — ABNORMAL LOW (ref >60)
Glucose, Bld: 123 mg/dL — ABNORMAL HIGH (ref 70–99)
Potassium: 4.7 mmol/L (ref 3.5–5.1)
Sodium: 137 mmol/L (ref 135–145)

## 2020-12-29 LAB — GLUCOSE, CAPILLARY
Glucose-Capillary: 101 mg/dL — ABNORMAL HIGH (ref 70–99)
Glucose-Capillary: 106 mg/dL — ABNORMAL HIGH (ref 70–99)
Glucose-Capillary: 131 mg/dL — ABNORMAL HIGH (ref 70–99)
Glucose-Capillary: 137 mg/dL — ABNORMAL HIGH (ref 70–99)
Glucose-Capillary: 93 mg/dL (ref 70–99)
Glucose-Capillary: 97 mg/dL (ref 70–99)
Glucose-Capillary: 99 mg/dL (ref 70–99)

## 2020-12-29 LAB — CBC
HCT: 39.8 % (ref 39.0–52.0)
Hemoglobin: 13.1 g/dL (ref 13.0–17.0)
MCH: 32.1 pg (ref 26.0–34.0)
MCHC: 32.9 g/dL (ref 30.0–36.0)
MCV: 97.5 fL (ref 80.0–100.0)
Platelets: 241 10*3/uL (ref 150–400)
RBC: 4.08 MIL/uL — ABNORMAL LOW (ref 4.22–5.81)
RDW: 13.1 % (ref 11.5–15.5)
WBC: 8.7 10*3/uL (ref 4.0–10.5)
nRBC: 0 % (ref 0.0–0.2)

## 2020-12-29 LAB — TROPONIN I (HIGH SENSITIVITY)
Troponin I (High Sensitivity): 1042 ng/L (ref <18)
Troponin I (High Sensitivity): 962 ng/L (ref <18)

## 2020-12-29 LAB — APTT
aPTT: 93 seconds — ABNORMAL HIGH (ref 24–36)
aPTT: 96 seconds — ABNORMAL HIGH (ref 24–36)

## 2020-12-29 LAB — TSH: TSH: 6.705 u[IU]/mL — ABNORMAL HIGH (ref 0.350–4.500)

## 2020-12-29 LAB — T4, FREE: Free T4: 1.1 ng/dL (ref 0.61–1.12)

## 2020-12-29 LAB — HEPARIN LEVEL (UNFRACTIONATED): Heparin Unfractionated: 0.94 IU/mL — ABNORMAL HIGH (ref 0.30–0.70)

## 2020-12-29 MED ORDER — FUROSEMIDE 10 MG/ML IJ SOLN
40.0000 mg | Freq: Every day | INTRAMUSCULAR | Status: DC
  Administered 2020-12-30: 40 mg via INTRAVENOUS
  Filled 2020-12-29: qty 4

## 2020-12-29 NOTE — Progress Notes (Signed)
[place] [facility] for heparin Indication: atrial fibrillation  Allergies  Allergen Reactions  . Zetia [Ezetimibe] Other (See Comments)    Myalgias   . Codeine Nausea And Vomiting       . Unithroid [Levothyroxine Sodium] Other (See Comments)    Caused blurry vision per pt    Patient Measurements: Height: 5\' 7"  (170.2 cm) Weight: 73.5 kg (162 lb 0.6 oz) IBW/kg (Calculated) : 66.1  Heparin dosing weight: 73.5 kg  Vital Signs: Temp: 98.2 F (36.8 C) (01/09 0754) Temp Source: Oral (01/09 0754) BP: 110/67 (01/09 1005) Pulse Rate: 62 (01/09 1005)  Labs: Recent Labs    12/27/20 2330 12/28/20 0401 12/28/20 0816 12/28/20 1404 12/28/20 2330 12/29/20 0205  HGB 13.5  --  13.2  --   --  13.1  HCT 41.6  --  41.7  --   --  39.8  PLT 248  --  231  --   --  241  APTT  --   --   --  37* 96* 93*  HEPARINUNFRC  --   --   --  1.56*  --  0.94*  CREATININE 1.47*  --  1.23  --   --   --   TROPONINIHS 64* 963*  --   --   --   --     Estimated Creatinine Clearance: 45.5 mL/min (by C-G formula based on SCr of 1.23 mg/dL).   Medical History: Past Medical History:  Diagnosis Date  . Arthritis   . CAD (coronary artery disease)   . Carotid artery occlusion   . Cataract    Bil eyes/worse in left eye  . CHF (congestive heart failure) ([place])   . Chronic back pain   . DVT (deep venous thrombosis) ([place])   . Dysrhythmia   . Enlarged prostate    takes Rapaflo daily  . GERD (gastroesophageal reflux disease)    occasional  . History of colon polyps   . History of gout    has colchicine prn  . History of kidney stones   . Hyperlipidemia    takes Crestor daily  . Hypertension    takes Amlodipine daily  . Hypothyroidism   . Myocardial infarction ([place])   . Peripheral vascular disease ([place])   . Pulmonary emboli ([place]) 03/20/2015   elevated d-dimer, intermediate V/Q study, atypical chest pain and SOB. Start on Xarelto 20mg  BID for 3 month  . Rapid atrial  fibrillation ([place])   . Renal insufficiency   . Shortness of breath dyspnea   . Urinary frequency   . Urinary urgency     Assessment: 80yo male called EMS for CP, found to be in [place] >> cardioverted to Afib, to transition from apixaban to heparin during further cardiac w/u; last dose of Eliquis taken 1/7 at 1600.  aPTT remains therapeutic at 93 seconds on drip rate 1250 units/hr. Heparin level falsely elevated given recent Eliquis. Will dose based off aPTT until levels correlate. CBC wnl and stable. No overt bleeding or infusion issues noted.  Goal of Therapy:  Heparin level 0.3-0.7 units/ml aPTT 66-102 seconds Monitor platelets by anticoagulation protocol: Yes   Plan:  Continue IV heparin to 1250 units/hr Monitor daily aPTT/HL, CBC, and s/sx bleeding  Richardine Service, PharmD, BCPS PGY2 Cardiology Pharmacy Resident Phone: [phone] 12/29/2020  11:02 AM  Please check [email or web address] for unit-specific pharmacy phone numbers.

## 2020-12-29 NOTE — Progress Notes (Signed)
ANTICOAGULATION CONSULT NOTE - Follow Up Consult  Pharmacy Consult for heparin Indication: atrial fibrillation  Labs: Recent Labs    12/27/20 2330 12/28/20 0401 12/28/20 0816 12/28/20 1404 12/28/20 2330  HGB 13.5  --  13.2  --   --   HCT 41.6  --  41.7  --   --   PLT 248  --  231  --   --   APTT  --   --   --  37* 96*  HEPARINUNFRC  --   --   --  1.56*  --   CREATININE 1.47*  --  1.23  --   --   TROPONINIHS 64* 963*  --   --   --     Assessment/Plan:  80yo male therapeutic on heparin after rate change. Will continue gtt at current rate and confirm stable with am labs.   Wynona Neat, PharmD, BCPS  12/29/2020,12:12 AM

## 2020-12-29 NOTE — Consult Note (Signed)
Cardiology Consultation:   Patient ID: IBRAHAM [name] MRN: [id number]; DOB: [birth year]  Admit date: 12/27/2020 Date of Consult: 12/29/2020  Primary Care Provider: Marton Redwood, MD [facility] Cardiologist: Mertie Moores, MD  [facility] Electrophysiologist:  None Renwick Asman,MD   Patient Profile:   Ryan [name] is a 80 y.o. male with a hx of CAD, s/p CABG who is being seen today for the evaluation of VT at the request of Dr. Acie Fredrickson.  History of Present Illness:   Ryan [name] is a pleasant 80 yo man with known coronary artery disease status post bypass surgery, with very small blood vessels, with a history of persistent atrial fibrillation on amiodarone, as well as subsequent thyroid dysfunction and hypertension.  He has class II heart failure symptoms at baseline.  He was in his usual state of health until yesterday when he suddenly developed shortness of breath and chest pressure.  He called 911 and on arrival was found to be in a wide QRS tachycardia.  He was given intravenous Versed and cardioverted.  He initially reverted to sinus rhythm but appears back in A. fib.  The patient did not experience palpitations.  After being cardioverted, he returned to his normal.  His initial troponin was around 1000.  No acute ST-T wave changes.  He has been treated with intravenous amiodarone and had no recurrent VT.   Past Medical History:  Diagnosis Date  . Arthritis   . CAD (coronary artery disease)   . Carotid artery occlusion   . Cataract    Bil eyes/worse in left eye  . CHF (congestive heart failure) ([place])   . Chronic back pain   . DVT (deep venous thrombosis) ([place])   . Dysrhythmia   . Enlarged prostate    takes Rapaflo daily  . GERD (gastroesophageal reflux disease)    occasional  . History of colon polyps   . History of gout    has colchicine prn  . History of kidney stones   . Hyperlipidemia    takes Crestor daily  . Hypertension    takes Amlodipine daily  .  Hypothyroidism   . Myocardial infarction ([place])   . Peripheral vascular disease ([place])   . Pulmonary emboli ([place]) 03/20/2015   elevated d-dimer, intermediate V/Q study, atypical chest pain and SOB. Start on Xarelto 20mg  BID for 3 month  . Rapid atrial fibrillation ([place])   . Renal insufficiency   . Shortness of breath dyspnea   . Urinary frequency   . Urinary urgency     Past Surgical History:  Procedure Laterality Date  . APPENDECTOMY    . BACK SURGERY     5 times  . big toe surgery    . CARDIAC CATHETERIZATION     2010    dr Acie Fredrickson  . cataract surgery     left eye  . CHOLECYSTECTOMY N/A 07/27/2016   Procedure: LAPAROSCOPIC CHOLECYSTECTOMY;  Surgeon: Mickeal Skinner, MD;  Location: [place];  Service: General;  Laterality: N/A;  . COLONOSCOPY    . CORONARY ARTERY BYPASS GRAFT N/A 04/05/2015   Procedure: CORONARY ARTERY BYPASS GRAFTING (CABG)X4 LIMA-LAD; SVG-DIAG1-DIAG2; SVG-PD;  Surgeon: [name], MD;  Location: [place];  Service: Open Heart Surgery;  Laterality: N/A;  . CORONARY/GRAFT ANGIOGRAPHY N/A 04/22/2018   Procedure: CORONARY/GRAFT ANGIOGRAPHY;  Surgeon: Martinique, Peter M, MD;  Location: [place] CV LAB;  Service: Cardiovascular;  Laterality: N/A;  . CYSTOSCOPY    . ENDARTERECTOMY Left 04/24/2016   Procedure: ENDARTERECTOMY LEFT  CAROTID;  Surgeon: Mal Misty, MD;  Location: [place];  Service: Vascular;  Laterality: Left;  . EYE SURGERY    . FEMORAL ARTERY - POPLITEAL ARTERY BYPASS GRAFT    . JOINT REPLACEMENT     shoulder  . LEFT HEART CATHETERIZATION WITH CORONARY ANGIOGRAM N/A 04/03/2015   Procedure: LEFT HEART CATHETERIZATION WITH CORONARY ANGIOGRAM;  Surgeon: Troy Sine, MD;  Location: [facility] CATH LAB;  Service: Cardiovascular;  Laterality: N/A;  . LUMBAR LAMINECTOMY  01/06/2013   Procedure: MICRODISCECTOMY LUMBAR LAMINECTOMY;  Surgeon: Marybelle Killings, MD;  Location: [place];  Service: Orthopedics;  Laterality: N/A;  L3-4 decompression  . LUMBAR  LAMINECTOMY/DECOMPRESSION MICRODISCECTOMY  02/12/2012   Procedure: LUMBAR LAMINECTOMY/DECOMPRESSION MICRODISCECTOMY;  Surgeon: Floyce Stakes, MD;  Location: [place] NEURO ORS;  Service: Neurosurgery;  Laterality: N/A;  Lumbar four-five laminectomy  . PATCH ANGIOPLASTY Left 04/24/2016   Procedure: LEFT CAROTID ARTERY PATCH ANGIOPLASTY;  Surgeon: Mal Misty, MD;  Location: [place];  Service: Vascular;  Laterality: Left;  . RIGHT HEART CATH AND CORONARY/GRAFT ANGIOGRAPHY N/A 01/30/2019   Procedure: RIGHT HEART CATH AND CORONARY/GRAFT ANGIOGRAPHY;  Surgeon: Troy Sine, MD;  Location: [place] CV LAB;  Service: Cardiovascular;  Laterality: N/A;  . STERIOD INJECTION Right 01/09/2014   Procedure: STEROID INJECTION;  Surgeon: Mcarthur Rossetti, MD;  Location: [place];  Service: Orthopedics;  Laterality: Right;  . TEE WITHOUT CARDIOVERSION N/A 04/05/2015   Procedure: TRANSESOPHAGEAL ECHOCARDIOGRAM (TEE);  Surgeon: [name], MD;  Location: [place];  Service: Open Heart Surgery;  Laterality: N/A;  . TOTAL HIP ARTHROPLASTY Left 01/09/2014   DR Ninfa Linden  . TOTAL HIP ARTHROPLASTY Left 01/09/2014   Procedure: LEFT TOTAL HIP ARTHROPLASTY ANTERIOR APPROACH and Steroid Injection Right hip;  Surgeon: Mcarthur Rossetti, MD;  Location: [place];  Service: Orthopedics;  Laterality: Left;  . TOTAL HIP ARTHROPLASTY Right 08/15/2019  . TOTAL HIP ARTHROPLASTY Right 08/15/2019   Procedure: RIGHT TOTAL HIP ARTHROPLASTY ANTERIOR APPROACH;  Surgeon: Mcarthur Rossetti, MD;  Location: [place];  Service: Orthopedics;  Laterality: Right;     Home Medications:  Prior to Admission medications   Medication Sig Start Date End Date Taking? Authorizing Provider  allopurinol (ZYLOPRIM) 100 MG tablet Take 100 mg by mouth daily as needed (gout). 05/29/20  Yes [provider]  amiodarone (PACERONE) 200 MG tablet Take 0.5 tablets (100 mg total) by mouth daily. 07/29/20  Yes [name], [name], MD  amLODipine  (NORVASC) 2.5 MG tablet Take 1 tablet (2.5 mg total) by mouth daily. 07/29/20  Yes [name], [name], MD  bisoprolol (ZEBETA) 5 MG tablet Take 0.5 tablets (2.5 mg total) by mouth daily. 05/10/20  Yes [name], [name], MD  ELIQUIS 5 MG TABS tablet TAKE 1 TABLET(5 MG) BY MOUTH TWICE DAILY Patient taking differently: Take 5 mg by mouth 2 (two) times daily. 09/02/20  Yes [name], [name], MD  Evolocumab (REPATHA SURECLICK) [id number] MG/ML SOAJ Inject 1 pen into the skin every 14 (fourteen) days. 08/30/20  Yes [name], [name], MD  furosemide (LASIX) 40 MG tablet Take 40 mg by mouth at bedtime. 09/27/20  Yes [provider]  HYDROcodone-acetaminophen (NORCO/VICODIN) 5-325 MG tablet Take 1 tablet by mouth every 6 (six) hours as needed for moderate pain.   Yes [provider]  isosorbide mononitrate (IMDUR) 30 MG 24 hr tablet Take 1 tablet (30 mg total) by mouth daily. 01/18/20  Yes [name], [name], MD  levothyroxine (SYNTHROID) 50 MCG tablet  Take 50 mcg by mouth See admin instructions. Every other night 07/17/19  Yes [provider]  nitroGLYCERIN (NITROSTAT) 0.4 MG SL tablet PLACE 1 TABLET UNDER THE TONGUE EVERY 5 MINUTES AS NEEDED FOR CHEST PAIN OR SHORTNESS OF BREATH Patient taking differently: Place 0.4 mg under the tongue every 5 (five) minutes as needed for chest pain. 11/21/20  Yes [name], Deloris Ping, MD  oxyCODONE (OXY IR/ROXICODONE) 5 MG immediate release tablet Take 1-2 tablets (5-10 mg total) by mouth every 6 (six) hours as needed for moderate pain (pain score 4-6). 08/29/19  Yes Kirtland Bouchard, PA-C  potassium chloride SA (KLOR-CON) 20 MEQ tablet Take 2 tablets (40 mEq total) by mouth daily. Patient taking differently: Take 20 mEq by mouth 2 (two) times daily. 05/10/20  Yes [name], Deloris Ping, MD  tamsulosin (FLOMAX) 0.4 MG CAPS capsule Take 0.4 mg by mouth at bedtime. 03/01/17  Yes [provider]  traMADol (ULTRAM) 50 MG tablet Take 1 tablet (50 mg total) by mouth 2 (two) times  daily. Patient taking differently: Take 50 mg by mouth every 6 (six) hours as needed for moderate pain. 07/03/20  Yes Lenn Sink, DPM    Inpatient Medications: Scheduled Meds: . amiodarone  400 mg Oral Q12H   Followed by  . [START ON 01/12/2021] amiodarone  400 mg Oral Daily  . Chlorhexidine Gluconate Cloth  6 each Topical Daily  . [START ON 12/30/2020] furosemide  40 mg Intravenous Daily  . insulin aspart  0-15 Units Subcutaneous TID WC  . insulin aspart  0-5 Units Subcutaneous QHS  . isosorbide mononitrate  30 mg Oral Daily  . levothyroxine  50 mcg Oral QODAY  . mouth rinse  15 mL Mouth Rinse BID  . potassium chloride  20 mEq Oral BID  . sodium chloride flush  3 mL Intravenous Q12H  . tamsulosin  0.4 mg Oral QHS   Continuous Infusions: . sodium chloride    . heparin 1,250 Units/hr (12/29/20 1009)  . nitroGLYCERIN Stopped (12/28/20 1833)   PRN Meds: sodium chloride, acetaminophen, HYDROcodone-acetaminophen, nitroGLYCERIN, ondansetron (ZOFRAN) IV, sodium chloride flush  Allergies:    Allergies  Allergen Reactions  . Zetia [Ezetimibe] Other (See Comments)    Myalgias   . Codeine Nausea And Vomiting       . Unithroid [Levothyroxine Sodium] Other (See Comments)    Caused blurry vision per pt    Social History:   Social History   Socioeconomic History  . Marital status: Married    Spouse name: Not on file  . Number of children: Not on file  . Years of education: Not on file  . Highest education level: Not on file  Occupational History  . Not on file  Tobacco Use  . Smoking status: Former Smoker    Types: Cigarettes    Quit date: 02/04/1987    Years since quitting: 33.9  . Smokeless tobacco: Former Neurosurgeon    Types: Chew    Quit date: 07/20/2009  . Tobacco comment: quit 35+yrs ago  Vaping Use  . Vaping Use: Never used  Substance and Sexual Activity  . Alcohol use: No    Alcohol/week: 0.0 standard drinks  . Drug use: No  . Sexual activity: Not Currently  Other  Topics Concern  . Not on file  Social History Narrative  . Not on file   Social Determinants of Health   Financial Resource Strain: Not on file  Food Insecurity: Not on file  Transportation Needs: Not on  file  Physical Activity: Not on file  Stress: Not on file  Social Connections: Not on file  Intimate Partner Violence: Not on file    Family History:    Family History  Problem Relation Age of Onset  . Heart disease Father   . Heart attack Father   . Heart disease Sister   . Hypertension Sister   . Heart attack Sister   . Hypertension Mother   . Diabetes Son      ROS:  Please see the history of present illness.   All other ROS reviewed and negative.     Physical Exam/Data:   Vitals:   12/29/20 0900 12/29/20 1000 12/29/20 1005 12/29/20 1156  BP: (!) 100/56 (!) 89/54 110/67   Pulse: (!) 51 63 62   Resp: 18 16 (!) 25   Temp:    98.3 F (36.8 C)  TempSrc:    Oral  SpO2: 95% 97% 96%   Weight:      Height:        Intake/Output Summary (Last 24 hours) at 12/29/2020 1215 Last data filed at 12/29/2020 1100 Gross per 24 hour  Intake 743.16 ml  Output 3560 ml  Net -2816.84 ml   Last 3 Weights 12/28/2020 12/27/2020 07/29/2020  Weight (lbs) 162 lb 0.6 oz 162 lb 161 lb 12.8 oz  Weight (kg) 73.5 kg 73.483 kg 73.392 kg     Body mass index is 25.38 kg/m.  General:  Well nourished, well developed, in no acute distress HEENT: normal Lymph: no adenopathy Neck: no JVD Endocrine:  No thryomegaly Vascular: No carotid bruits; FA pulses 2+ bilaterally without bruits  Cardiac:  normal S1, S2; IRRR; no murmur  Lungs:  clear to auscultation bilaterally, no wheezing, rhonchi or rales  Abd: soft, nontender, no hepatomegaly  Ext: no edema Musculoskeletal:  No deformities, BUE and BLE strength normal and equal Skin: warm and dry  Neuro:  CNs 2-12 intact, no focal abnormalities noted Psych:  Normal affect   EKG:  The EKG was personally reviewed and demonstrates: Ventricular  tachycardia with a right bundle branch block QRS morphology and a right inferior axis.  The transition from positive to negative is V6 although V6 is probably still more negative than positive. Telemetry:  Telemetry was personally reviewed and demonstrates: Probable atrial fibrillation  Relevant CV Studies: 2D echo demonstrates left ventricular ejection fraction of 35%.  There is left ventricular hypertrophy.  Right ventricular function is normal.  There is no significant valvular abnormality.  Laboratory Data:  High Sensitivity Troponin:   Recent Labs  Lab 12/27/20 2330 12/28/20 0401 12/29/20 0940  TROPONINIHS 64* 963* 1,042*     Chemistry Recent Labs  Lab 12/27/20 2330 12/28/20 0816  NA 138 134*  K 4.7 4.5  CL 107 96*  CO2 22 21*  GLUCOSE 256* 411*  BUN 20 19  CREATININE 1.47* 1.23  CALCIUM 8.5* 7.9*  GFRNONAA 48* 60*  ANIONGAP 9 17*    No results for input(s): PROT, ALBUMIN, AST, ALT, ALKPHOS, BILITOT in the last 168 hours. Hematology Recent Labs  Lab 12/27/20 2330 12/28/20 0816 12/29/20 0205  WBC 13.1* 9.3 8.7  RBC 4.15* 4.16* 4.08*  HGB 13.5 13.2 13.1  HCT 41.6 41.7 39.8  MCV 100.2* 100.2* 97.5  MCH 32.5 31.7 32.1  MCHC 32.5 31.7 32.9  RDW 13.2 13.2 13.1  PLT 248 231 241   BNP Recent Labs  Lab 12/28/20 0816  BNP 705.8*    DDimer No results for input(s):  DDIMER in the last 168 hours.   Radiology/Studies:  DG Chest Port 1 View  Result Date: 12/28/2020 CLINICAL DATA:  Chest pain. EXAM: PORTABLE CHEST 1 VIEW COMPARISON:  Radiograph 01/27/2019 FINDINGS: Median sternotomy and CABG. Mild cardiomegaly. Aortic atherosclerosis. No pulmonary edema. Mild chronic interstitial coarsening. Streaky opacities at the left lung base. No pleural fluid or pneumothorax. Postsurgical change of the left shoulder. Overlying monitoring devices partially obscure detailed assessment. IMPRESSION: 1. Streaky left basilar opacities, favor atelectasis. 2. Mild cardiomegaly post CABG.  Electronically Signed   By: Keith Rake M.D.   On: 12/28/2020 00:08   ECHOCARDIOGRAM COMPLETE  Result Date: 12/28/2020    ECHOCARDIOGRAM REPORT   Patient Name:   Ryan [name] Date of Exam: 12/28/2020 Medical Rec #:  [id number]         Height:       67.0 in Accession #:    [id number]        Weight:       162.0 lb Date of Birth:  12-Nov-1941         BSA:          1.849 m Patient Age:    52 years          BP:           127/69 mmHg Patient Gender: M                 HR:           74 bpm. Exam Location:  Inpatient Procedure: 2D Echo, Color Doppler and Cardiac Doppler Indications:    I47.2 Ventricular tachycardia  History:        Patient has prior history of Echocardiogram examinations, most                 recent 01/28/2019. CHF, Prior CABG, Arrythmias:Atrial                 Fibrillation; Risk Factors:Hypertension and Dyslipidemia.  Sonographer:    Raquel Sarna Senior RDCS Referring Phys: [place] Comments: Scanned upright on bipap. IMPRESSIONS  1. Left ventricular ejection fraction, by estimation, is 35 to 40%. The left ventricle has moderately decreased function. The left ventricle has no regional wall motion abnormalities. There is moderate asymmetric left ventricular hypertrophy. Left ventricular diastolic function could not be evaluated.  2. Right ventricular systolic function is normal. The right ventricular size is normal. There is mildly elevated pulmonary artery systolic pressure.  3. Left atrial size was moderately dilated.  4. The mitral valve is grossly normal. No evidence of mitral valve regurgitation.  5. The aortic valve is tricuspid. There is mild calcification of the aortic valve. There is moderate thickening of the aortic valve. Aortic valve regurgitation is not visualized. FINDINGS  Left Ventricle: Left ventricular ejection fraction, by estimation, is 35 to 40%. The left ventricle has moderately decreased function. The left ventricle has no regional wall motion  abnormalities. The left ventricular internal cavity size was normal in size. There is moderate asymmetric left ventricular hypertrophy. Left ventricular diastolic function could not be evaluated due to atrial fibrillation. Left ventricular diastolic function could not be evaluated. Right Ventricle: The right ventricular size is normal. No increase in right ventricular wall thickness. Right ventricular systolic function is normal. There is mildly elevated pulmonary artery systolic pressure. The tricuspid regurgitant velocity is 2.42  m/s, and with an assumed right atrial pressure of 15 mmHg, the estimated right ventricular systolic pressure is [id number] mmHg. Left  Atrium: Left atrial size was moderately dilated. Right Atrium: Right atrial size was normal in size. Pericardium: There is no evidence of pericardial effusion. Mitral Valve: The mitral valve is grossly normal. No evidence of mitral valve regurgitation. Tricuspid Valve: The tricuspid valve is grossly normal. Tricuspid valve regurgitation is mild. Aortic Valve: The aortic valve is tricuspid. There is mild calcification of the aortic valve. There is moderate thickening of the aortic valve. There is mild aortic valve annular calcification. Aortic valve regurgitation is not visualized. Pulmonic Valve: The pulmonic valve was not well visualized. Pulmonic valve regurgitation is not visualized. Aorta: The aortic root and ascending aorta are structurally normal, with no evidence of dilitation. IAS/Shunts: The atrial septum is grossly normal.  LEFT VENTRICLE PLAX 2D LVIDd:         3.40 cm     Diastology LVIDs:         2.60 cm     LV e' medial:    5.77 cm/s LV PW:         1.10 cm     LV E/e' medial:  13.1 LV IVS:        1.50 cm     LV e' lateral:   7.29 cm/s LVOT diam:     2.20 cm     LV E/e' lateral: 10.4 LV SV:         35 LV SV Index:   19 LVOT Area:     3.80 cm  LV Volumes (MOD) LV vol d, MOD A2C: 90.6 ml LV vol d, MOD A4C: 78.7 ml LV vol s, MOD A2C: 52.4 ml LV vol s,  MOD A4C: 53.6 ml LV SV MOD A2C:     38.2 ml LV SV MOD A4C:     78.7 ml LV SV MOD BP:      34.5 ml RIGHT VENTRICLE RV S prime:     6.96 cm/s TAPSE (M-mode): 0.6 cm LEFT ATRIUM             Index       RIGHT ATRIUM           Index LA diam:        4.30 cm 2.33 cm/m  RA Area:     15.50 cm LA Vol (A2C):   77.1 ml 41.70 ml/m RA Volume:   36.30 ml  19.63 ml/m LA Vol (A4C):   77.5 ml 41.91 ml/m LA Biplane Vol: 81.3 ml 43.97 ml/m  AORTIC VALVE LVOT Vmax:   45.00 cm/s LVOT Vmean:  31.800 cm/s LVOT VTI:    0.093 m  AORTA Ao Root diam: 3.10 cm MITRAL VALVE               TRICUSPID VALVE MV Area (PHT): 2.16 cm    TR Peak grad:   23.4 mmHg MV Decel Time: 352 msec    TR Vmax:        242.00 cm/s MV E velocity: 75.80 cm/s MV A velocity: 13.30 cm/s  SHUNTS MV E/A ratio:  5.70        Systemic VTI:  0.09 m                            Systemic Diam: 2.20 cm Mertie Moores MD Electronically signed by Mertie Moores MD Signature Date/Time: 12/28/2020/2:05:16 PM    Final      Assessment and Plan:   1. Sustained monomorphic ventricular tachycardia -the patient has hemodynamic instability with this  and will undergo insertion of an ICD.  He is currently on intravenous amiodarone.  I would be inclined with his prior history of having thyroid dysfunction to reduce his dose of amiodarone from intravenous down to oral 200 mg daily.  We will do this tomorrow. 2. Ischemic cardiomyopathy -the patient's coronary anatomy is quite difficult.  He has not had angina.  His cardiac enzymes are minimally elevated and EKG suggests no evidence of an acute coronary syndrome as the cause of his ventricular tachycardia.  I do not feel strongly about cardiac catheterization prior to ICD insertion because the likelihood of finding a significant lesion that could be readily revascularized is exceedingly low. 3. Atrial fibrillation -he was cardioverted back to sinus rhythm though appears to be back in A. fib today.  I would not be aggressive at this point in  trying to maintain sinus rhythm, but rather attempt rate control of his atrial fibrillation.        For questions or updates, please contact [place] Please consult [email or web address] for contact info under    Signed, Cristopher Peru, MD  12/29/2020 12:15 PM

## 2020-12-29 NOTE — Progress Notes (Signed)
Progress Note  Patient Name: Ryan [name] Date of Encounter: 12/29/2020  [facility] Cardiologist: Mertie Moores, MD   Subjective   80 year old gentleman with a history of diffuse severe native coronary artery disease, history of CABG with a patent LIMA to LAD.  He was brought in by EMS several months ago with sustained monomorphic ventricular tachycardia.  He was cardioverted on route to the hospital.  In the ER he had flash pulmonary edema requiring BiPAP.  He was given Lasix and supported overnight.  He diuresed a net 2.8 L overnight. Feeling much better.  Should be able to go home tomorrow     Inpatient Medications    Scheduled Meds: . amiodarone  400 mg Oral Q12H   Followed by  . [START ON 01/12/2021] amiodarone  400 mg Oral Daily  . Chlorhexidine Gluconate Cloth  6 each Topical Daily  . furosemide  40 mg Intravenous BID  . insulin aspart  0-15 Units Subcutaneous TID WC  . insulin aspart  0-5 Units Subcutaneous QHS  . isosorbide mononitrate  30 mg Oral Daily  . levothyroxine  50 mcg Oral QODAY  . mouth rinse  15 mL Mouth Rinse BID  . potassium chloride  20 mEq Oral BID  . sodium chloride flush  3 mL Intravenous Q12H  . tamsulosin  0.4 mg Oral QHS   Continuous Infusions: . sodium chloride    . heparin 1,250 Units/hr (12/29/20 1009)  . nitroGLYCERIN Stopped (12/28/20 1833)   PRN Meds: sodium chloride, acetaminophen, HYDROcodone-acetaminophen, nitroGLYCERIN, ondansetron (ZOFRAN) IV, sodium chloride flush   Vital Signs    Vitals:   12/29/20 0800 12/29/20 0900 12/29/20 1000 12/29/20 1005  BP: (!) 112/48 (!) 100/56 (!) 89/54 110/67  Pulse: 66 (!) 51 63 62  Resp: (!) 26 18 16  (!) 25  Temp:      TempSrc:      SpO2: 96% 95% 97% 96%  Weight:      Height:        Intake/Output Summary (Last 24 hours) at 12/29/2020 1111 Last data filed at 12/29/2020 1100 Gross per 24 hour  Intake 743.16 ml  Output 3560 ml  Net -2816.84 ml   Last 3 Weights 12/28/2020 12/27/2020  07/29/2020  Weight (lbs) 162 lb 0.6 oz 162 lb 161 lb 12.8 oz  Weight (kg) 73.5 kg 73.483 kg 73.392 kg      Telemetry    afib with controlled V response  - Personally Reviewed  ECG     - Personally Reviewed  Physical Exam   Physical Exam: Blood pressure 110/67, pulse 62, temperature 98.2 F (36.8 C), temperature source Oral, resp. rate (!) 25, height 5\' 7"  (1.702 m), weight 73.5 kg, SpO2 96 %.  GEN:  Elderly male,  NAD ,  Much better than yesterday  HEENT: Normal NECK: No JVD; No carotid bruits LYMPHATICS: No lymphadenopathy CARDIAC: irreg. Irreg.  RESPIRATORY:  Clear to auscultation without rales, wheezing or rhonchi  ABDOMEN: Soft, non-tender, non-distended MUSCULOSKELETAL:  No edema; No deformity  SKIN: Warm and dry NEUROLOGIC:  Alert and oriented x 3    Labs    High Sensitivity Troponin:   Recent Labs  Lab 12/27/20 2330 12/28/20 0401 12/29/20 0940  TROPONINIHS 64* 963* 1,042*      Chemistry Recent Labs  Lab 12/27/20 2330 12/28/20 0816  NA 138 134*  K 4.7 4.5  CL 107 96*  CO2 22 21*  GLUCOSE 256* 411*  BUN 20 19  CREATININE 1.47* 1.23  CALCIUM  8.5* 7.9*  GFRNONAA 48* 60*  ANIONGAP 9 17*     Hematology Recent Labs  Lab 12/27/20 2330 12/28/20 0816 12/29/20 0205  WBC 13.1* 9.3 8.7  RBC 4.15* 4.16* 4.08*  HGB 13.5 13.2 13.1  HCT 41.6 41.7 39.8  MCV 100.2* 100.2* 97.5  MCH 32.5 31.7 32.1  MCHC 32.5 31.7 32.9  RDW 13.2 13.2 13.1  PLT 248 231 241    BNP Recent Labs  Lab 12/28/20 0816  BNP 705.8*     DDimer No results for input(s): DDIMER in the last 168 hours.   Radiology    DG Chest Port 1 View  Result Date: 12/28/2020 CLINICAL DATA:  Chest pain. EXAM: PORTABLE CHEST 1 VIEW COMPARISON:  Radiograph 01/27/2019 FINDINGS: Median sternotomy and CABG. Mild cardiomegaly. Aortic atherosclerosis. No pulmonary edema. Mild chronic interstitial coarsening. Streaky opacities at the left lung base. No pleural fluid or pneumothorax. Postsurgical  change of the left shoulder. Overlying monitoring devices partially obscure detailed assessment. IMPRESSION: 1. Streaky left basilar opacities, favor atelectasis. 2. Mild cardiomegaly post CABG. Electronically Signed   By: Keith Rake M.D.   On: 12/28/2020 00:08   ECHOCARDIOGRAM COMPLETE  Result Date: 12/28/2020    ECHOCARDIOGRAM REPORT   Patient Name:   Ryan [name] Date of Exam: 12/28/2020 Medical Rec #:  [id number]         Height:       67.0 in Accession #:    [id number]        Weight:       162.0 lb Date of Birth:  1941-02-03         BSA:          1.849 m Patient Age:    41 years          BP:           127/69 mmHg Patient Gender: M                 HR:           74 bpm. Exam Location:  Inpatient Procedure: 2D Echo, Color Doppler and Cardiac Doppler Indications:    I47.2 Ventricular tachycardia  History:        Patient has prior history of Echocardiogram examinations, most                 recent 01/28/2019. CHF, Prior CABG, Arrythmias:Atrial                 Fibrillation; Risk Factors:Hypertension and Dyslipidemia.  Sonographer:    Raquel Sarna Senior RDCS Referring Phys: [place] Comments: Scanned upright on bipap. IMPRESSIONS  1. Left ventricular ejection fraction, by estimation, is 35 to 40%. The left ventricle has moderately decreased function. The left ventricle has no regional wall motion abnormalities. There is moderate asymmetric left ventricular hypertrophy. Left ventricular diastolic function could not be evaluated.  2. Right ventricular systolic function is normal. The right ventricular size is normal. There is mildly elevated pulmonary artery systolic pressure.  3. Left atrial size was moderately dilated.  4. The mitral valve is grossly normal. No evidence of mitral valve regurgitation.  5. The aortic valve is tricuspid. There is mild calcification of the aortic valve. There is moderate thickening of the aortic valve. Aortic valve regurgitation is not visualized.  FINDINGS  Left Ventricle: Left ventricular ejection fraction, by estimation, is 35 to 40%. The left ventricle has moderately decreased function. The left ventricle has no regional wall motion abnormalities.  The left ventricular internal cavity size was normal in size. There is moderate asymmetric left ventricular hypertrophy. Left ventricular diastolic function could not be evaluated due to atrial fibrillation. Left ventricular diastolic function could not be evaluated. Right Ventricle: The right ventricular size is normal. No increase in right ventricular wall thickness. Right ventricular systolic function is normal. There is mildly elevated pulmonary artery systolic pressure. The tricuspid regurgitant velocity is 2.42  m/s, and with an assumed right atrial pressure of 15 mmHg, the estimated right ventricular systolic pressure is 98.9 mmHg. Left Atrium: Left atrial size was moderately dilated. Right Atrium: Right atrial size was normal in size. Pericardium: There is no evidence of pericardial effusion. Mitral Valve: The mitral valve is grossly normal. No evidence of mitral valve regurgitation. Tricuspid Valve: The tricuspid valve is grossly normal. Tricuspid valve regurgitation is mild. Aortic Valve: The aortic valve is tricuspid. There is mild calcification of the aortic valve. There is moderate thickening of the aortic valve. There is mild aortic valve annular calcification. Aortic valve regurgitation is not visualized. Pulmonic Valve: The pulmonic valve was not well visualized. Pulmonic valve regurgitation is not visualized. Aorta: The aortic root and ascending aorta are structurally normal, with no evidence of dilitation. IAS/Shunts: The atrial septum is grossly normal.  LEFT VENTRICLE PLAX 2D LVIDd:         3.40 cm     Diastology LVIDs:         2.60 cm     LV e' medial:    5.77 cm/s LV PW:         1.10 cm     LV E/e' medial:  13.1 LV IVS:        1.50 cm     LV e' lateral:   7.29 cm/s LVOT diam:     2.20 cm      LV E/e' lateral: 10.4 LV SV:         35 LV SV Index:   19 LVOT Area:     3.80 cm  LV Volumes (MOD) LV vol d, MOD A2C: 90.6 ml LV vol d, MOD A4C: 78.7 ml LV vol s, MOD A2C: 52.4 ml LV vol s, MOD A4C: 53.6 ml LV SV MOD A2C:     38.2 ml LV SV MOD A4C:     78.7 ml LV SV MOD BP:      34.5 ml RIGHT VENTRICLE RV S prime:     6.96 cm/s TAPSE (M-mode): 0.6 cm LEFT ATRIUM             Index       RIGHT ATRIUM           Index LA diam:        4.30 cm 2.33 cm/m  RA Area:     15.50 cm LA Vol (A2C):   77.1 ml 41.70 ml/m RA Volume:   36.30 ml  19.63 ml/m LA Vol (A4C):   77.5 ml 41.91 ml/m LA Biplane Vol: 81.3 ml 43.97 ml/m  AORTIC VALVE LVOT Vmax:   45.00 cm/s LVOT Vmean:  31.800 cm/s LVOT VTI:    0.093 m  AORTA Ao Root diam: 3.10 cm MITRAL VALVE               TRICUSPID VALVE MV Area (PHT): 2.16 cm    TR Peak grad:   23.4 mmHg MV Decel Time: 352 msec    TR Vmax:        242.00 cm/s MV E velocity: 75.80 cm/s  MV A velocity: 13.30 cm/s  SHUNTS MV E/A ratio:  5.70        Systemic VTI:  0.09 m                            Systemic Diam: 2.20 cm Mertie Moores MD Electronically signed by Mertie Moores MD Signature Date/Time: 12/28/2020/2:05:16 PM    Final     Cardiac Studies      Patient Profile     80 y.o. male   Assessment & Plan    1.  Acute on chronic combined CHF:   New EF of 35-40%. Previous EF was normal. Feels better after lasix  Cont imdur.   BP is on the low side.   We may not be able to add any CHF meds at this point but will add in as tolerated as OP   2.  CAD:  On Imdur.   Trop level is 1042.   I have personally reviewed his heart catheterization films from 2 years ago.  He has severe diffuse disease and it is not surprising that he has troponin elevations in the setting of sustained ventricular tachycardia.  He is not having chest pain currently.  He is not a good candidate for repeat heart catheterization so I would hold off on doing any heart cath unless he had further episodes of pain or an ST segment  elevation myocardial infarction.  2.  Ventricular tachycardia: He has been relatively stable.  He is now on oral amiodarone.  He will be seen by electrophysiology today.  I suspect this is an ischemic issue.  He may or may not need an ICD.   4.  Chronic kidney disease: Creatinine is mildly elevated.  We will recheck levels today.  Transfer to tele.        For questions or updates, please contact [place] Please consult [email or web address] for contact info under        Signed, Mertie Moores, MD  12/29/2020, 11:11 AM

## 2020-12-29 NOTE — Plan of Care (Signed)

## 2020-12-30 ENCOUNTER — Inpatient Hospital Stay (HOSPITAL_COMMUNITY)
Admission: EM | Disposition: A | Discharge status: Home / Self Care | Payer: Self-pay | Source: Home / Self Care | Attending: Internal Medicine

## 2020-12-30 DIAGNOSIS — I472 Ventricular tachycardia: Secondary | ICD-10-CM | POA: Diagnosis not present

## 2020-12-30 DIAGNOSIS — I255 Ischemic cardiomyopathy: Secondary | ICD-10-CM

## 2020-12-30 HISTORY — PX: ICD IMPLANT: EP1208

## 2020-12-30 LAB — CBC
HCT: 40.1 % (ref 39.0–52.0)
Hemoglobin: 12.9 g/dL — ABNORMAL LOW (ref 13.0–17.0)
MCH: 31.5 pg (ref 26.0–34.0)
MCHC: 32.2 g/dL (ref 30.0–36.0)
MCV: 97.8 fL (ref 80.0–100.0)
Platelets: 245 10*3/uL (ref 150–400)
RBC: 4.1 MIL/uL — ABNORMAL LOW (ref 4.22–5.81)
RDW: 13.1 % (ref 11.5–15.5)
WBC: 8.2 10*3/uL (ref 4.0–10.5)
nRBC: 0 % (ref 0.0–0.2)

## 2020-12-30 LAB — BASIC METABOLIC PANEL
Anion gap: 9 (ref 5–15)
BUN: 18 mg/dL (ref 8–23)
CO2: 27 mmol/L (ref 22–32)
Calcium: 8.6 mg/dL — ABNORMAL LOW (ref 8.9–10.3)
Chloride: 104 mmol/L (ref 98–111)
Creatinine, Ser: 1.32 mg/dL — ABNORMAL HIGH (ref 0.61–1.24)
GFR, Estimated: 55 mL/min — ABNORMAL LOW (ref >60)
Glucose, Bld: 107 mg/dL — ABNORMAL HIGH (ref 70–99)
Potassium: 4.5 mmol/L (ref 3.5–5.1)
Sodium: 140 mmol/L (ref 135–145)

## 2020-12-30 LAB — GLUCOSE, CAPILLARY
Glucose-Capillary: 116 mg/dL — ABNORMAL HIGH (ref 70–99)
Glucose-Capillary: 108 mg/dL — ABNORMAL HIGH (ref 70–99)
Glucose-Capillary: 104 mg/dL — ABNORMAL HIGH (ref 70–99)
Glucose-Capillary: 122 mg/dL — ABNORMAL HIGH (ref 70–99)
Glucose-Capillary: 134 mg/dL — ABNORMAL HIGH (ref 70–99)

## 2020-12-30 LAB — HEPARIN LEVEL (UNFRACTIONATED): Heparin Unfractionated: 1 IU/mL — ABNORMAL HIGH (ref 0.30–0.70)

## 2020-12-30 LAB — SURGICAL PCR SCREEN
MRSA, PCR: NEGATIVE
Staphylococcus aureus: NEGATIVE

## 2020-12-30 LAB — APTT: aPTT: 105 seconds — ABNORMAL HIGH (ref 24–36)

## 2020-12-30 SURGERY — ICD IMPLANT
Anesthesia: LOCAL

## 2020-12-30 MED ORDER — SODIUM CHLORIDE 0.9 % IV SOLN: INTRAVENOUS | Status: DC

## 2020-12-30 MED ORDER — MIDAZOLAM HCL 5 MG/5ML IJ SOLN
INTRAMUSCULAR | Status: AC
  Filled 2020-12-30: qty 5

## 2020-12-30 MED ORDER — HEPARIN (PORCINE) IN NACL 1000-0.9 UT/500ML-% IV SOLN
INTRAVENOUS | Status: AC
  Filled 2020-12-30: qty 500

## 2020-12-30 MED ORDER — ACETAMINOPHEN 325 MG PO TABS
325.0000 mg | ORAL_TABLET | ORAL | Status: DC | PRN
Start: 2020-12-30 — End: 2020-12-31
  Administered 2020-12-30: 650 mg via ORAL
  Filled 2020-12-30: qty 2

## 2020-12-30 MED ORDER — LIDOCAINE HCL 1 % IJ SOLN
INTRAMUSCULAR | Status: AC
  Filled 2020-12-30: qty 20

## 2020-12-30 MED ORDER — FUROSEMIDE 40 MG PO TABS
40.0000 mg | ORAL_TABLET | Freq: Every day | ORAL | Status: DC
  Administered 2020-12-31: 40 mg via ORAL
  Filled 2020-12-30: qty 1

## 2020-12-30 MED ORDER — FENTANYL CITRATE (PF) 100 MCG/2ML IJ SOLN
INTRAMUSCULAR | Status: DC | PRN
  Administered 2020-12-30: 12.5 ug via INTRAVENOUS
  Administered 2020-12-30 (×2): 25 ug via INTRAVENOUS

## 2020-12-30 MED ORDER — FENTANYL CITRATE (PF) 100 MCG/2ML IJ SOLN
INTRAMUSCULAR | Status: AC
  Filled 2020-12-30: qty 2

## 2020-12-30 MED ORDER — SODIUM CHLORIDE 0.9 % IV SOLN
80.0000 mg | INTRAVENOUS | Status: DC
  Filled 2020-12-30: qty 2

## 2020-12-30 MED ORDER — ONDANSETRON HCL 4 MG/2ML IJ SOLN: 4.0000 mg | Freq: Four times a day (QID) | INTRAMUSCULAR | Status: DC | PRN

## 2020-12-30 MED ORDER — CHLORHEXIDINE GLUCONATE 4 % EX LIQD
60.0000 mL | Freq: Once | CUTANEOUS | Status: AC
  Administered 2020-12-30: 4 via TOPICAL
  Filled 2020-12-30: qty 60

## 2020-12-30 MED ORDER — HEPARIN (PORCINE) IN NACL 1000-0.9 UT/500ML-% IV SOLN
INTRAVENOUS | Status: DC | PRN
  Administered 2020-12-30: 500 mL

## 2020-12-30 MED ORDER — SODIUM CHLORIDE 0.9 % IV SOLN
INTRAVENOUS | Status: DC
Start: 2020-12-30 — End: 2020-12-30

## 2020-12-30 MED ORDER — MIDAZOLAM HCL 5 MG/5ML IJ SOLN
INTRAMUSCULAR | Status: DC | PRN
  Administered 2020-12-30: 2 mg via INTRAVENOUS
  Administered 2020-12-30 (×2): 1 mg via INTRAVENOUS

## 2020-12-30 MED ORDER — CEFAZOLIN SODIUM-DEXTROSE 2-4 GM/100ML-% IV SOLN
2.0000 g | INTRAVENOUS | Status: AC
  Administered 2020-12-30: 2 g via INTRAVENOUS
  Filled 2020-12-30: qty 100

## 2020-12-30 MED ORDER — LIDOCAINE HCL (PF) 1 % IJ SOLN
INTRAMUSCULAR | Status: DC | PRN
  Administered 2020-12-30: 60 mL

## 2020-12-30 MED ORDER — SODIUM CHLORIDE 0.9 % IV SOLN
INTRAVENOUS | Status: AC
  Filled 2020-12-30: qty 2

## 2020-12-30 MED ORDER — IOHEXOL 350 MG/ML SOLN
INTRAVENOUS | Status: DC | PRN
  Administered 2020-12-30 (×2): 10 mL

## 2020-12-30 MED ORDER — CEFAZOLIN SODIUM-DEXTROSE 2-4 GM/100ML-% IV SOLN
INTRAVENOUS | Status: AC
  Filled 2020-12-30: qty 100

## 2020-12-30 MED ORDER — CEFAZOLIN SODIUM-DEXTROSE 1-4 GM/50ML-% IV SOLN
1.0000 g | Freq: Four times a day (QID) | INTRAVENOUS | Status: AC
  Administered 2020-12-30 – 2020-12-31 (×3): 1 g via INTRAVENOUS
  Filled 2020-12-30 (×5): qty 50

## 2020-12-30 SURGICAL SUPPLY — 9 items
CABLE SURGICAL S-101-97-12 (CABLE) ×2 IMPLANT
ICD MOMENTUM D120 (ICD Generator) ×1 IMPLANT
KIT MICROPUNCTURE NIT STIFF (SHEATH) ×1 IMPLANT
LEAD RELIANCE 0137-59 (Lead) ×1 IMPLANT
MAT PREVALON FULL STRYKER (MISCELLANEOUS) ×1 IMPLANT
PAD PRO RADIOLUCENT 2001M-C (PAD) ×2 IMPLANT
SHEATH PINNACLE 9F 10CM (SHEATH) ×1 IMPLANT
TRAY PACEMAKER INSERTION (PACKS) ×2 IMPLANT
WIRE HI TORQ VERSACORE-J 145CM (WIRE) ×1 IMPLANT

## 2020-12-30 NOTE — Plan of Care (Signed)

## 2020-12-30 NOTE — Progress Notes (Addendum)
Electrophysiology Rounding Note  Patient Name: Ryan [name] Date of Encounter: 12/30/2020  Primary Cardiologist: Mertie Moores, MD Electrophysiologist: Dr. Lovena Le   Subjective   The patient is doing well today.  At this time, the patient denies chest pain, shortness of breath, or any new concerns.  Inpatient Medications    Scheduled Meds: . amiodarone  400 mg Oral Q12H   Followed by  . [START ON 01/12/2021] amiodarone  400 mg Oral Daily  . Chlorhexidine Gluconate Cloth  6 each Topical Daily  . furosemide  40 mg Intravenous Daily  . insulin aspart  0-15 Units Subcutaneous TID WC  . insulin aspart  0-5 Units Subcutaneous QHS  . isosorbide mononitrate  30 mg Oral Daily  . levothyroxine  50 mcg Oral QODAY  . mouth rinse  15 mL Mouth Rinse BID  . potassium chloride  20 mEq Oral BID  . sodium chloride flush  3 mL Intravenous Q12H  . tamsulosin  0.4 mg Oral QHS   Continuous Infusions: . sodium chloride    . heparin 1,150 Units/hr (12/30/20 0600)  . nitroGLYCERIN Stopped (12/28/20 1833)   PRN Meds: sodium chloride, acetaminophen, HYDROcodone-acetaminophen, nitroGLYCERIN, ondansetron (ZOFRAN) IV, sodium chloride flush   Vital Signs    Vitals:   12/30/20 0400 12/30/20 0500 12/30/20 0600 12/30/20 0700  BP: (!) 112/55 (!) 98/56 (!) 110/55 (!) 121/53  Pulse: (!) 51 (!) 55 (!) 57 60  Resp: 16 13 14 15   Temp:      TempSrc:      SpO2: 97% 97% 98% 97%  Weight:  70.5 kg    Height:        Intake/Output Summary (Last 24 hours) at 12/30/2020 0749 Last data filed at 12/30/2020 0600 Gross per 24 hour  Intake 635.08 ml  Output 2360 ml  Net -1724.92 ml   Filed Weights   12/27/20 2327 12/28/20 1114 12/30/20 0500  Weight: 73.5 kg 73.5 kg 70.5 kg    Physical Exam    GEN- The patient is well appearing, alert and oriented x 3 today.   Head- normocephalic, atraumatic Eyes-  Sclera clear, conjunctiva pink Ears- hearing intact Oropharynx- clear Neck- supple Lungs- Clear to  ausculation bilaterally, normal work of breathing Heart- Regular rate and rhythm, no murmurs, rubs or gallops GI- soft, NT, ND, + BS Extremities- no clubbing or cyanosis. No edema Skin- no rash or lesion Psych- euthymic mood, full affect Neuro- strength and sensation are intact  Labs    CBC Recent Labs    12/27/20 2330 12/28/20 0816 12/29/20 0205 12/30/20 0106  WBC 13.1*   < > 8.7 8.2  NEUTROABS 11.8*  --   --   --   HGB 13.5   < > 13.1 12.9*  HCT 41.6   < > 39.8 40.1  MCV 100.2*   < > 97.5 97.8  PLT 248   < > 241 245   < > = values in this interval not displayed.   Basic Metabolic Panel Recent Labs    12/27/20 2330 12/28/20 0816 12/29/20 1151 12/30/20 0106  NA 138 134* 137 140  K 4.7 4.5 4.7 4.5  CL 107 96* 101 104  CO2 22 21* 25 27  GLUCOSE 256* 411* 123* 107*  BUN 20 19 20 18   CREATININE 1.47* 1.23 1.51* 1.32*  CALCIUM 8.5* 7.9* 8.9 8.6*  MG 1.9 2.3  --   --    Liver Function Tests No results for input(s): AST, ALT, ALKPHOS, BILITOT, PROT, ALBUMIN  in the last 72 hours. No results for input(s): LIPASE, AMYLASE in the last 72 hours. Cardiac Enzymes No results for input(s): CKTOTAL, CKMB, CKMBINDEX, TROPONINI in the last 72 hours.   Telemetry    AF 60-70s (personally reviewed)  Radiology    ECHOCARDIOGRAM COMPLETE  Result Date: 12/28/2020    ECHOCARDIOGRAM REPORT   Patient Name:   [name] Date of Exam: 12/28/2020 Medical Rec #:  [id number]         Height:       67.0 in Accession #:    [id number]        Weight:       162.0 lb Date of Birth:  12-Jan-1941         BSA:          1.849 m Patient Age:    67 years          BP:           127/69 mmHg Patient Gender: M                 HR:           74 bpm. Exam Location:  Inpatient Procedure: 2D Echo, Color Doppler and Cardiac Doppler Indications:    I47.2 Ventricular tachycardia  History:        Patient has prior history of Echocardiogram examinations, most                 recent 01/28/2019. CHF, Prior CABG,  Arrythmias:Atrial                 Fibrillation; Risk Factors:Hypertension and Dyslipidemia.  Sonographer:    Raquel Sarna Senior RDCS Referring Phys: [place] Comments: Scanned upright on bipap. IMPRESSIONS  1. Left ventricular ejection fraction, by estimation, is 35 to 40%. The left ventricle has moderately decreased function. The left ventricle has no regional wall motion abnormalities. There is moderate asymmetric left ventricular hypertrophy. Left ventricular diastolic function could not be evaluated.  2. Right ventricular systolic function is normal. The right ventricular size is normal. There is mildly elevated pulmonary artery systolic pressure.  3. Left atrial size was moderately dilated.  4. The mitral valve is grossly normal. No evidence of mitral valve regurgitation.  5. The aortic valve is tricuspid. There is mild calcification of the aortic valve. There is moderate thickening of the aortic valve. Aortic valve regurgitation is not visualized. FINDINGS  Left Ventricle: Left ventricular ejection fraction, by estimation, is 35 to 40%. The left ventricle has moderately decreased function. The left ventricle has no regional wall motion abnormalities. The left ventricular internal cavity size was normal in size. There is moderate asymmetric left ventricular hypertrophy. Left ventricular diastolic function could not be evaluated due to atrial fibrillation. Left ventricular diastolic function could not be evaluated. Right Ventricle: The right ventricular size is normal. No increase in right ventricular wall thickness. Right ventricular systolic function is normal. There is mildly elevated pulmonary artery systolic pressure. The tricuspid regurgitant velocity is 2.42  m/s, and with an assumed right atrial pressure of 15 mmHg, the estimated right ventricular systolic pressure is [id number] mmHg. Left Atrium: Left atrial size was moderately dilated. Right Atrium: Right atrial size was normal in  size. Pericardium: There is no evidence of pericardial effusion. Mitral Valve: The mitral valve is grossly normal. No evidence of mitral valve regurgitation. Tricuspid Valve: The tricuspid valve is grossly normal. Tricuspid valve regurgitation is mild. Aortic Valve: The aortic valve is  tricuspid. There is mild calcification of the aortic valve. There is moderate thickening of the aortic valve. There is mild aortic valve annular calcification. Aortic valve regurgitation is not visualized. Pulmonic Valve: The pulmonic valve was not well visualized. Pulmonic valve regurgitation is not visualized. Aorta: The aortic root and ascending aorta are structurally normal, with no evidence of dilitation. IAS/Shunts: The atrial septum is grossly normal.  LEFT VENTRICLE PLAX 2D LVIDd:         3.40 cm     Diastology LVIDs:         2.60 cm     LV e' medial:    5.77 cm/s LV PW:         1.10 cm     LV E/e' medial:  13.1 LV IVS:        1.50 cm     LV e' lateral:   7.29 cm/s LVOT diam:     2.20 cm     LV E/e' lateral: 10.4 LV SV:         35 LV SV Index:   19 LVOT Area:     3.80 cm  LV Volumes (MOD) LV vol d, MOD A2C: 90.6 ml LV vol d, MOD A4C: 78.7 ml LV vol s, MOD A2C: 52.4 ml LV vol s, MOD A4C: 53.6 ml LV SV MOD A2C:     38.2 ml LV SV MOD A4C:     78.7 ml LV SV MOD BP:      34.5 ml RIGHT VENTRICLE RV S prime:     6.96 cm/s TAPSE (M-mode): 0.6 cm LEFT ATRIUM             Index       RIGHT ATRIUM           Index LA diam:        4.30 cm 2.33 cm/m  RA Area:     15.50 cm LA Vol (A2C):   77.1 ml 41.70 ml/m RA Volume:   36.30 ml  19.63 ml/m LA Vol (A4C):   77.5 ml 41.91 ml/m LA Biplane Vol: 81.3 ml 43.97 ml/m  AORTIC VALVE LVOT Vmax:   45.00 cm/s LVOT Vmean:  31.800 cm/s LVOT VTI:    0.093 m  AORTA Ao Root diam: 3.10 cm MITRAL VALVE               TRICUSPID VALVE MV Area (PHT): 2.16 cm    TR Peak grad:   23.4 mmHg MV Decel Time: 352 msec    TR Vmax:        242.00 cm/s MV E velocity: 75.80 cm/s MV A velocity: 13.30 cm/s  SHUNTS MV E/A  ratio:  5.70        Systemic VTI:  0.09 m                            Systemic Diam: 2.20 cm Mertie Moores MD Electronically signed by Mertie Moores MD Signature Date/Time: 12/28/2020/2:05:16 PM    Final     Patient Profile     KATRELL MILHORN is a 80 y.o. male with a hx of CAD, s/p CABG who is being seen today for the evaluation of VT at the request of Dr. Acie Fredrickson.  Assessment & Plan    1. Sustained monomorphic ventricular tachycardia - the patient has hemodynamic instability with this and will undergo insertion of an ICD. He is currently on intravenous amiodarone.  He has been transitioned  to po amiodarone. Will confirm dosing with Dr. Lovena Le given prior history of having thyroid dysfunction  Explained risks, benefits, and alternatives to ICD implantation, including but not limited to bleeding, infection, pneumothorax, pericardial effusion, lead dislodgement, heart attack, stroke, or death.  Pt verbalized understanding and agrees to proceed  2. Ischemic cardiomyopathy - The patient's coronary anatomy is quite difficult.  He has not had angina.  His cardiac enzymes are minimally elevated and EKG suggests no evidence of an acute coronary syndrome as the cause of his ventricular tachycardia. Per Dr. Lovena Le, do not feel strongly about cardiac catheterization prior to ICD insertion because the likelihood of finding a significant lesion that could be readily revascularized is exceedingly low. 3. Atrial fibrillation - he was cardioverted back to sinus rhythm but now A. Fib.  Rates are well controlled. Would follow rates for now, especially in setting of holding [place] for ICD.  CHA2DS2VASC of at least 5.  He is on Eliquis chronically.    For questions or updates, please contact [place] Please consult [email or web address] for contact info under Cardiology/STEMI.  Signed, Shirley Friar, PA-C  12/30/2020, 7:49 AM   EP attending  Patient seen and examined.  Agree with the findings as noted  above.  He has had no recurrent ventricular tachycardia.  The patient's cardioversion out of VT initially returned him to sinus but he is now back in atrial fibrillation.  We will plan to proceed with ICD insertion today with the expectation that he will be discharged tomorrow.  We will hold off for several days before restarting back on systemic anticoagulation due to the concern about pocket bleeding.  I have reviewed the indications, risk, benefits, goals, and expectations of ICD implantation and he wishes to proceed.  Cristopher Peru, MD

## 2020-12-30 NOTE — H&P (Signed)
I have seen Ryan [name] is a 80 y.o. male in the hospital today who has been referred by Dr. Acie Fredrickson for consideration of ICD implant for secondary prevention of sudden death.  The patient's chart has been reviewed and they meet criteria for ICD implant.  I have had a thorough discussion with the patient reviewing options.  The patient and their family (if available) have had opportunities to ask questions and have them answered. The patient and I have decided together through a shared decision making process to proceed with ICD at this time.  Risks, benefits, alternatives to ICD implantation were discussed in detail with the patient today. The patient  understands that the risks include but are not limited to bleeding, infection, pneumothorax, perforation, tamponade, vascular damage, renal failure, MI, stroke, death, inappropriate shocks, and lead dislodgement and he wishes to proceed.  We will therefore schedule device implantation at the next available time.

## 2020-12-30 NOTE — Discharge Instructions (Signed)
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 After 7 days, you may progress as below.   You should remove your sling 24 hours after your procedure, unless otherwise instructed by your provider.     Monday January 06, 2021  Tuesday January 07, 2021 Wednesday January 08, 2021 Thursday January 09, 2021    Do not lift, push, pull, or carry anything over 10 pounds with the affected arm until 6 weeks (Monday February 10, 2021 ) after your procedure.    Do NOT DRIVE until you have been seen for your wound check, or as long as instructed by your healthcare provider.    Ask your healthcare provider when you can go back to work   INCISION/Dressing  If you are on a blood thinner such as Coumadin, Xarelto, Eliquis, Plavix, or Pradaxa please confirm with your provider when this should be resumed.    Monitor your defibrillator site for redness, swelling, and drainage. Call the device clinic at [phone] if you experience these symptoms or fever/chills.   If your incision is sealed with Steri-strips or staples, you may shower 10 days after your procedure or when told by your provider. Do not remove the steri-strips or let the shower hit directly on your site. You may wash around your site with soap and water.     Avoid lotions, ointments, or perfumes over your incision until it is well-healed.   You may use a hot tub or a pool AFTER your wound check appointment if the incision is completely closed.   Your ICD is designed to protect you from life threatening heart rhythms. Because of this, you may receive a shock.   o 1 shock with no symptoms:  Call the office during business hours. o 1 shock with symptoms (chest pain, chest pressure, dizziness, lightheadedness, shortness of breath, overall feeling unwell):  Call 911. o If you experience 2 or more shocks  in 24 hours:  Call 911. o If you receive a shock, you should not drive for 6 months per the [place] DMV IF you receive appropriate therapy from your ICD.    ICD Alerts:  Some alerts are vibratory and others beep. These are NOT emergencies. Please call our office to let us know. If this occurs at night or on weekends, it can wait until the next business day. Send a remote transmission.   If your device is capable of reading fluid status (for heart failure), you will be offered monthly monitoring to review this with you.   DEVICE MANAGEMENT  Remote monitoring is used to monitor your ICD from home. This monitoring is scheduled every 91 days by our office. It allows [place] to keep an eye on the functioning of your device to ensure it is working properly. You will routinely see your Electrophysiologist annually (more often if necessary).    You should receive your ID card for your new device in 4-8 weeks. Keep this card with you at all times once received. Consider wearing a medical alert bracelet or necklace.   Your ICD  may be MRI compatible. This will be discussed at your next office visit/wound check.  You should avoid contact with strong electric or magnetic fields.    Do not use amateur (ham) radio equipment or electric (arc) welding torches. MP3 player headphones with magnets should not be used. Some devices are  safe to use if held at least 12 inches (30 cm) from your defibrillator. These include power tools, lawn mowers, and speakers. If you are unsure if something is safe to use, ask your health care provider.   When using your cell phone, hold it to the ear that is on the opposite side from the defibrillator. Do not leave your cell phone in a pocket over the defibrillator.   You may safely use electric blankets, heating pads, computers, and microwave ovens.  Call the office right away if:  You have chest pain.  You feel more than one shock.  You feel more short of breath than you have  felt before.  You feel more light-headed than you have felt before.  Your incision starts to open up.  This information is not intended to replace advice given to you by your health care provider. Make sure you discuss any questions you have with your health care provider.

## 2020-12-30 NOTE — Progress Notes (Signed)
ANTICOAGULATION CONSULT NOTE - Follow Up Consult  Pharmacy Consult for heparin Indication: atrial fibrillation  Labs: Recent Labs    12/28/20 0401 12/28/20 0816 12/28/20 1404 12/28/20 1404 12/28/20 2330 12/29/20 0205 12/29/20 0940 12/29/20 1151 12/30/20 0106  HGB  --  13.2  --   --   --  13.1  --   --  12.9*  HCT  --  41.7  --   --   --  39.8  --   --  40.1  PLT  --  231  --   --   --  241  --   --  245  APTT  --   --  37*   < > 96* 93*  --   --  105*  HEPARINUNFRC  --   --  1.56*  --   --  0.94*  --   --  1.00*  CREATININE  --  1.23  --   --   --   --   --  1.51* 1.32*  TROPONINIHS 963*  --   --   --   --   --  1,042* 962*  --    < > = values in this interval not displayed.    Assessment: 80yo male supratherapeutic on heparin after two PTTs at upper end of goal; no gtt issues or signs of bleeding per RN.  Goal of Therapy:  aPTT 66-102 seconds   Plan:  Will decrease heparin gtt by ~1-2 units/kg/hr to 1150 units/hr and check PTT in 8 hours.    Wynona Neat, PharmD, BCPS  12/30/2020,3:56 AM

## 2020-12-31 ENCOUNTER — Inpatient Hospital Stay (HOSPITAL_COMMUNITY): Payer: Medicare Other

## 2020-12-31 ENCOUNTER — Telehealth: Payer: Self-pay | Admitting: Student

## 2020-12-31 DIAGNOSIS — I472 Ventricular tachycardia: Secondary | ICD-10-CM | POA: Diagnosis not present

## 2020-12-31 DIAGNOSIS — I255 Ischemic cardiomyopathy: Secondary | ICD-10-CM | POA: Diagnosis not present

## 2020-12-31 LAB — CBC
HCT: 40 % (ref 39.0–52.0)
Hemoglobin: 13.9 g/dL (ref 13.0–17.0)
MCH: 33.1 pg (ref 26.0–34.0)
MCHC: 34.8 g/dL (ref 30.0–36.0)
MCV: 95.2 fL (ref 80.0–100.0)
Platelets: 233 10*3/uL (ref 150–400)
RBC: 4.2 MIL/uL — ABNORMAL LOW (ref 4.22–5.81)
RDW: 13.2 % (ref 11.5–15.5)
WBC: 9.2 10*3/uL (ref 4.0–10.5)
nRBC: 0 % (ref 0.0–0.2)

## 2020-12-31 LAB — GLUCOSE, CAPILLARY: Glucose-Capillary: 108 mg/dL — ABNORMAL HIGH (ref 70–99)

## 2020-12-31 MED ORDER — ACETAMINOPHEN 325 MG PO TABS
325.0000 mg | ORAL_TABLET | ORAL | Status: DC | PRN
Start: 2020-12-31 — End: 2021-01-15

## 2020-12-31 MED ORDER — APIXABAN 5 MG PO TABS: ORAL_TABLET | ORAL | 5 refills | Status: DC

## 2020-12-31 MED ORDER — AMIODARONE HCL 400 MG PO TABS: ORAL_TABLET | ORAL | 3 refills | Status: DC

## 2020-12-31 MED FILL — Gentamicin Sulfate Inj 40 MG/ML: INTRAMUSCULAR | Qty: 80 | Status: AC

## 2020-12-31 MED FILL — Lidocaine HCl Local Inj 1%: INTRAMUSCULAR | Qty: 60 | Status: AC

## 2020-12-31 NOTE — Plan of Care (Signed)

## 2020-12-31 NOTE — Telephone Encounter (Signed)
Follow Up:    She said they received 2 different prescriptions for pt's Amiodarone and two different directions. She  want to know which direction is correct please.Marland Kitchen

## 2020-12-31 NOTE — Discharge Summary (Addendum)
ELECTROPHYSIOLOGY PROCEDURE DISCHARGE SUMMARY    Patient ID: Ryan [name],  MRN: [id number], DOB/AGE: 03/23/1941 80 y.o.  Admit date: 12/27/2020 Discharge date: 12/31/2020  Primary Care Physician: Marton Redwood, MD  Primary Cardiologist: Mertie Moores, MD  Electrophysiologist: Dr. Lovena Le  Primary Diagnosis:  Sustained ventricular tachycardia Chronic systolic CHF  Secondary Diagnosis: Paroxysmal AF on Eliquis Ischemic cardiomyopathy Mild AKI. Cr varied from 1.23 - 1.51 this admission. Follow as outpatient  Allergies  Allergen Reactions   Zetia [Ezetimibe] Other (See Comments)    Myalgias    Codeine Nausea And Vomiting        Unithroid [Levothyroxine Sodium] Other (See Comments)    Caused blurry vision per pt     Procedures This Admission:  1.  Implantation of a [employer] single chamber ICD on 12/30/20 by Dr. Lovena Le.  The patient received a [employer] model number D120 ICD with model number [phone] right ventricular lead. There were no immediate post procedure complications. 2.  CXR on 12/31/20 demonstrated no pneumothorax status post device implantation.  Brief HPI: Ryan [name] is a 80 y.o. male was admitted for unstable monomorphic ventricular tachycardia and consulted by electrophysiology  for consideration of ICD implantation.  Past medical history includes above.  The patient has LV dysfunction and ICD is indicated for secondary prevention of ventricular tachycardia. Risks, benefits, and alternatives to ICD implantation were reviewed with the patient who wished to proceed.   Hospital Course:  The patient was admitted and underwent implantation of a [employer] single chamber ICD with details as outlined above. They were monitored on telemetry overnight which demonstrated NSR and no further VT.  Left chest was without hematoma or ecchymosis.  The device was interrogated and found to be functioning normally.  CXR was obtained and demonstrated no  pneumothorax status post device implantation..  Wound care, arm mobility, and restrictions were reviewed with the patient.  The patient was examined and considered stable for discharge to home.   The patient's discharge medications a beta blocker (bisoprolol). ACE/ARB has been precluded due to kidney disease. This will be revisited as an outpatient on 1/26. Regarding blood thinner therapy, they were instructed to resume their Eliquis on Sunday, 1/16 with the MORNING dose.    Due to history of thyroid dysfunction, will do rapid taper of amiodarone as below.   We discussed [place] DMV recommendations of no driving x 6 months after VT.   Physical Exam: Vitals:   12/31/20 0600 12/31/20 0645 12/31/20 0700 12/31/20 0800  BP: (!) 146/69  125/78 118/72  Pulse: (!) 44  85 76  Resp: 15  18   Temp:  97.9 F (36.6 C)  98.1 F (36.7 C)  TempSrc:  Oral  Oral  SpO2: 98%  99% 96%  Weight:      Height:        GEN- The patient is well appearing, alert and oriented x 3 today.   HEENT: normocephalic, atraumatic; sclera clear, conjunctiva pink; hearing intact; oropharynx clear; neck supple, no JVP Lymph- no cervical lymphadenopathy Lungs- Clear to ausculation bilaterally, normal work of breathing.  No wheezes, rales, rhonchi Heart- Regular rate and rhythm, no murmurs, rubs or gallops, PMI not laterally displaced GI- soft, non-tender, non-distended, bowel sounds present, no hepatosplenomegaly Extremities- no clubbing, cyanosis, or edema; DP/PT/radial pulses 2+ bilaterally MS- no significant deformity or atrophy Skin- warm and dry, no rash or lesion. ICD site stable. Psych- euthymic mood, full affect Neuro- strength and sensation are intact  Labs:   Lab Results  Component Value Date   WBC 9.2 12/31/2020   HGB 13.9 12/31/2020   HCT 40.0 12/31/2020   MCV 95.2 12/31/2020   PLT 233 12/31/2020    Recent Labs  Lab 12/30/20 0106  NA 140  K 4.5  CL 104  CO2 27  BUN 18  CREATININE 1.32*  CALCIUM  8.6*  GLUCOSE 107*    Discharge Medications:  Allergies as of 12/31/2020       Reactions   Zetia [ezetimibe] Other (See Comments)   Myalgias    Codeine Nausea And Vomiting       Unithroid [levothyroxine Sodium] Other (See Comments)   Caused blurry vision per pt        Medication List     TAKE these medications    acetaminophen 325 MG tablet Commonly known as: TYLENOL Take 1-2 tablets (325-650 mg total) by mouth every 4 (four) hours as needed for mild pain.   allopurinol 100 MG tablet Commonly known as: ZYLOPRIM Take 100 mg by mouth daily as needed (gout).   amiodarone 400 MG tablet Commonly known as: PACERONE Take 1 tablet (400 mg) twice daily, on 1/16 start taking 1 tablet once daily, on 1/23 start taking 0.5 tablet (200 mg) once daily. What changed:  medication strength how much to take how to take this when to take this additional instructions   amLODipine 2.5 MG tablet Commonly known as: NORVASC Take 1 tablet (2.5 mg total) by mouth daily.   apixaban 5 MG Tabs tablet Commonly known as: Eliquis TAKE 1 TABLET(5 MG) BY MOUTH TWICE DAILY. Resume 1/16 with am dose. Start taking on: January 05, 2021 What changed:  See the new instructions. These instructions start on January 05, 2021. If you are unsure what to do until then, ask your doctor or other care provider.   bisoprolol 5 MG tablet Commonly known as: ZEBETA Take 0.5 tablets (2.5 mg total) by mouth daily.   furosemide 40 MG tablet Commonly known as: LASIX Take 40 mg by mouth at bedtime.   HYDROcodone-acetaminophen 5-325 MG tablet Commonly known as: NORCO/VICODIN Take 1 tablet by mouth every 6 (six) hours as needed for moderate pain.   isosorbide mononitrate 30 MG 24 hr tablet Commonly known as: IMDUR Take 1 tablet (30 mg total) by mouth daily.   levothyroxine 50 MCG tablet Commonly known as: SYNTHROID Take 50 mcg by mouth See admin instructions. Every other night   nitroGLYCERIN 0.4 MG SL  tablet Commonly known as: NITROSTAT PLACE 1 TABLET UNDER THE TONGUE EVERY 5 MINUTES AS NEEDED FOR CHEST PAIN OR SHORTNESS OF BREATH What changed: See the new instructions.   oxyCODONE 5 MG immediate release tablet Commonly known as: Oxy IR/ROXICODONE Take 1-2 tablets (5-10 mg total) by mouth every 6 (six) hours as needed for moderate pain (pain score 4-6).   potassium chloride SA 20 MEQ tablet Commonly known as: KLOR-CON Take 2 tablets (40 mEq total) by mouth daily. What changed:  how much to take when to take this   Repatha SureClick 017 MG/ML Soaj Generic drug: Evolocumab Inject 1 pen into the skin every 14 (fourteen) days.   tamsulosin 0.4 MG Caps capsule Commonly known as: FLOMAX Take 0.4 mg by mouth at bedtime.   traMADol 50 MG tablet Commonly known as: Ultram Take 1 tablet (50 mg total) by mouth 2 (two) times daily. What changed:  when to take this reasons to take this        Disposition:  Follow-up Information     [place]ley Friar, PA-C Follow up.   Specialty: Physician Assistant Why: on Friday, 1/21 at 1210 pm for post ICD wound check Contact information: [place] [phone]         Leanor Kail, [place] Follow up.   Specialty: Cardiology Why: [facility] - we have moved your February appointment with [name] up to Wednesday January 15, 2021 at 11:15 AM with Robbie Lis, one of our PAs you have met before. Please arrive 15 minutes early to check in. Contact information: [address] [place] [address] [phone]                 Duration of Discharge Encounter: Greater than 30 minutes including physician time.  Jacalyn Lefevre, PA-C  12/31/2020 8:31 AM  EP Attending  Patient seen and examined. Agree with the findings as noted above. The patient is doing well after ICD insertion. He denies incisional pain. His device interrogation demonstrates  normal VVI ICD function and his CXR demonstrates normal lead position. He will be discharged home with followup as noted above.  Carleene Overlie Zebadiah Willert,MD

## 2020-12-31 NOTE — TOC Transition Note (Signed)
Transition of Care [facility]) - CM/SW Discharge Note   Patient Details  Name: Ryan [name] MRN: [id number] Date of Birth: 01-10-1941  [facility]) CM/SW Contact:  Sharin Mons, RN Phone Number: 12/31/2020, 11:54 AM   Clinical Narrative:    Patient will DC to: home Anticipated DC date: 01/01/2020 Family notified: wife Transport by: car  Admitted: Sustained ventricular tachycardia Chronic systolic CHF. S/p ICD on 12/30/20. From home with wife, [name]. [name] (Spouse)     [phone]     Per MD patient ready for DC today . RN, patient, patient's wife aware of d/c plan.  PTA states independent with ADL'S, Dme usage.  Consult received regarding heart failure. Pt states follows cardiologist instructions. Doesn't miss MD appointments. States knows does and don'ts regarding HFand  has scale @ home and knows the importance of weighing daily.  Pt without Rx med concerns or affordability.   Post hospital f/u noted on AVS.  RNCM will sign off for now as intervention is no longer needed. Please consult [place] again if new needs arise.   Final next level of care: Home/Self Care Barriers to Discharge: No Barriers Identified   Patient Goals and CMS Choice Patient states their goals for this hospitalization and ongoing recovery are:: to go home and get better      Discharge Placement                       Discharge Plan and Services                                     Social Determinants of Health (SDOH) Interventions     Readmission Risk Interventions No flowsheet data found.

## 2020-12-31 NOTE — Progress Notes (Signed)
Patient discharge instructions and medications reviewed with patient. All questions answered. Patient discharged via wheelchair with all personal belongings.

## 2020-12-31 NOTE — Telephone Encounter (Signed)
[facility] has 2 different amiodarone prescriptions sent today to them.  Needs clarification.  Reviewed with prescriber, Oda Kilts.  Adv to follow directions below.   Patient Sig: Take 1 tablet (400 mg) twice daily, on 1/16 start taking 1 tablet once daily, on 1/23 start taking 0.5 tablet (200 mg) once daily.     Ordered on: 12/31/2020     Authorized by: [place] Friar     Dispense: 45 tablet     Admin Instructions: Take 1 tablet (400 mg) twice daily, on 1/16 start taking 1 tablet once daily, on 1/23 start taking 0.5 tablet (200 mg) once daily.

## 2021-01-02 ENCOUNTER — Encounter (HOSPITAL_COMMUNITY): Payer: Self-pay | Admitting: Internal Medicine

## 2021-01-10 ENCOUNTER — Encounter: Payer: Medicare Other | Admitting: Student

## 2021-01-10 ENCOUNTER — Ambulatory Visit (INDEPENDENT_AMBULATORY_CARE_PROVIDER_SITE_OTHER): Payer: Medicare Other | Admitting: Student

## 2021-01-10 ENCOUNTER — Other Ambulatory Visit: Payer: Self-pay

## 2021-01-10 DIAGNOSIS — I5042 Chronic combined systolic (congestive) and diastolic (congestive) heart failure: Secondary | ICD-10-CM

## 2021-01-10 LAB — CUP PACEART INCLINIC DEVICE CHECK
Date Time Interrogation Session: 20220121124453
HighPow Impedance: 66 Ohm
Implantable Lead Implant Date: 20220110
Implantable Lead Location: 753860
Implantable Lead Model: 137
Implantable Lead Serial Number: 301176
Implantable Pulse Generator Implant Date: 20220110
Lead Channel Impedance Value: 469 Ohm
Lead Channel Pacing Threshold Amplitude: 1.3 V
Lead Channel Pacing Threshold Pulse Width: 0.4 ms
Lead Channel Sensing Intrinsic Amplitude: 15.6 mV
Lead Channel Setting Pacing Amplitude: 3.5 V
Lead Channel Setting Pacing Pulse Width: 0.4 ms
Lead Channel Setting Sensing Sensitivity: 0.5 mV
Pulse Gen Serial Number: 211464

## 2021-01-10 NOTE — Patient Instructions (Signed)
Medication Instructions:  1) Stop Amlodipine   2) Start taking Bisoprolol at bedtime   *If you need a refill on your cardiac medications before your next appointment, please call your pharmacy*   Lab Work: None ordered   If you have labs (blood work) drawn today and your tests are completely normal, you will receive your results only by: Marland Kitchen MyChart Message (if you have MyChart) OR . A paper copy in the mail If you have any lab test that is abnormal or we need to change your treatment, we will call you to review the results.   Testing/Procedures: None ordered    Follow-Up: Keep upcoming appointments as scheduled    Other Instructions None

## 2021-01-10 NOTE — Progress Notes (Signed)
Wound check appointment. Steri-strips removed. Wound without redness or edema. Incision edges approximated, wound well healed. Normal device function. Sensing and impedances consistent with implant measurements. RV threshold 1.3V today, 1.0V at implant. Follow Device programmed at 3.5V for extra safety margin until 3 month visit. Histogram distribution appropriate for patient and level of activity. No mode switches or ventricular arrhythmias noted. Patient educated about wound care, arm mobility, lifting restrictions, shock plan. ROV in 3 months with implanting physician.  BP 112/56. Stop amlodipine. Move bisoprolol to bedtime with intermittent "dizzy spells".  No correlated events on device as above.   Keep gen cards follow up for continues medication adjustment as tolerated for ICM/Chronic systolic CHF.   He is still awaiting his monitor due to supply chain issues. Will reach back out to [facility].  Legrand Como [address] [place], PA-C  01/10/2021 12:50 PM

## 2021-01-14 ENCOUNTER — Ambulatory Visit: Payer: Medicare Other

## 2021-01-15 ENCOUNTER — Ambulatory Visit (INDEPENDENT_AMBULATORY_CARE_PROVIDER_SITE_OTHER): Payer: Medicare Other | Admitting: Physician Assistant

## 2021-01-15 ENCOUNTER — Other Ambulatory Visit: Payer: Self-pay

## 2021-01-15 ENCOUNTER — Encounter: Payer: Self-pay | Admitting: Physician Assistant

## 2021-01-15 VITALS — BP 120/60 | HR 59 | Ht 66.0 in | Wt 151.0 lb

## 2021-01-15 DIAGNOSIS — I25110 Atherosclerotic heart disease of native coronary artery with unstable angina pectoris: Secondary | ICD-10-CM

## 2021-01-15 DIAGNOSIS — I5042 Chronic combined systolic (congestive) and diastolic (congestive) heart failure: Secondary | ICD-10-CM

## 2021-01-15 DIAGNOSIS — I472 Ventricular tachycardia, unspecified: Secondary | ICD-10-CM

## 2021-01-15 DIAGNOSIS — E785 Hyperlipidemia, unspecified: Secondary | ICD-10-CM

## 2021-01-15 DIAGNOSIS — I480 Paroxysmal atrial fibrillation: Secondary | ICD-10-CM

## 2021-01-15 NOTE — Patient Instructions (Signed)
Medication Instructions:  Your physician recommends that you continue on your current medications as directed. Please refer to the Current Medication list given to you today.  *If you need a refill on your cardiac medications before your next appointment, please call your pharmacy*   Lab Work: TODAY:  BMET   If you have labs (blood work) drawn today and your tests are completely normal, you will receive your results only by: Marland Kitchen MyChart Message (if you have MyChart) OR . A paper copy in the mail If you have any lab test that is abnormal or we need to change your treatment, we will call you to review the results.   Testing/Procedures: None ordered   Follow-Up: At [facility], you and your health needs are our priority.  As part of our continuing mission to provide you with exceptional heart care, we have created designated Provider Care Teams.  These Care Teams include your primary Cardiologist (physician) and Advanced Practice Providers (APPs -  Physician Assistants and Nurse Practitioners) who all work together to provide you with the care you need, when you need it.  We recommend signing up for the patient portal called "MyChart".  Sign up information is provided on this After Visit Summary.  MyChart is used to connect with patients for Virtual Visits (Telemedicine).  Patients are able to view lab/test results, encounter notes, upcoming appointments, etc.  Non-urgent messages can be sent to your provider as well.   To learn more about what you can do with MyChart, go to [email or web address].    Your next appointment:   2 month(s)  The format for your next appointment:   In Person  Provider:   Mertie Moores, MD    Other Instructions

## 2021-01-15 NOTE — Progress Notes (Signed)
Cardiology Office Note:    Date:  01/15/2021   ID:  Ryan [name], DOB 1941/01/31, MRN [id number]  PCP:  Marton Redwood, MD  [facility] Cardiologist:  Mertie Moores, MD  [place] Electrophysiologist:  None   Chief Complaint: hospital follow up   History of Present Illness:    Ryan [name] is a 80 y.o. male with a hx of with coronary artery disease status post prior myocardial infarctions and subsequent CABG in [id number], combined systolic and diastolic heart failure, carotid artery disease s/p L CEA (followed by Dr. Kellie Simmering), hypertension, hyperlipidemia, hypothyroidism, paroxysmal atrial fibrillation and CKD presents for hospital follow up.   Last Cath 01/2019 showed severe native coronary artery disease with diffuse proximal to mid LAD stenosis with diffusely diseased native LAD proximally and mid with competitive filling from the LIMA graft; 90% proximal stenosis in the ramus immediate vessel, diffuse stenosis of 60 to 70% in the proximal circumflex with diffuse 80% distal stenosis and total occlusion of the AV groove after marginal vessel with total occlusion of the proximal native RCA. There is extensive collateralization to the distal RCA both via the circumflex vessel as well as via the LIMA to LAD and distal LAD. Patent LIMA graft supplying the mid LAD. There was previously documented old occlusion of the previous Y graft which had supplied the ramus and marginal vessel in the vein graft which had supplied the PDA.No intervention was completed.   Admitted 12/2020 with sustained monomorphic ventricular tachycardia.  He was cardioverted on route to the hospital.  In the ER he had flash pulmonary edema requiring BiPAP. Echo with reduced LVEF to 35-40% (previously normal). Dr. Acie Fredrickson did not recommend cath given lack of chest pain. Seen by EP and underwent implantation of a [employer] single chamber ICD. No recurrent VT. Recommended not to drive for 6 months given VT.   He  was having some dizzy spell during wound check visit 01/10/21. Stopped Amlodipine. Moved Bisoprolol to bedtime. No VT noted on device check.   Here today for follow up.  Patient reports that dizziness has been improved.  This occurs in the morning when he takes his medication, especially with potassium supplement.  No swallowing difficulty.  Sometimes dizziness happens when he tried to stand up from sitting position.  Last for 2 to 3 seconds with self resolution.  He denies orthopnea, PND, lower extremity edema, palpitation or syncope.   Past Medical History:  Diagnosis Date  . Arthritis   . CAD (coronary artery disease)   . Carotid artery occlusion   . Cataract    Bil eyes/worse in left eye  . CHF (congestive heart failure) ([place])   . Chronic back pain   . DVT (deep venous thrombosis) ([place])   . Dysrhythmia   . Enlarged prostate    takes Rapaflo daily  . GERD (gastroesophageal reflux disease)    occasional  . History of colon polyps   . History of gout    has colchicine prn  . History of kidney stones   . Hyperlipidemia    takes Crestor daily  . Hypertension    takes Amlodipine daily  . Hypothyroidism   . Myocardial infarction ([place])   . Peripheral vascular disease ([place])   . Pulmonary emboli ([place]) 03/20/2015   elevated d-dimer, intermediate V/Q study, atypical chest pain and SOB. Start on Xarelto 20mg  BID for 3 month  . Rapid atrial fibrillation ([place])   . Renal insufficiency   . Shortness of breath dyspnea   .  Urinary frequency   . Urinary urgency     Past Surgical History:  Procedure Laterality Date  . APPENDECTOMY    . BACK SURGERY     5 times  . big toe surgery    . CARDIAC CATHETERIZATION     2010    dr Acie Fredrickson  . cataract surgery     left eye  . CHOLECYSTECTOMY N/A 07/27/2016   Procedure: LAPAROSCOPIC CHOLECYSTECTOMY;  Surgeon: Mickeal Skinner, MD;  Location: [place];  Service: General;  Laterality: N/A;  . COLONOSCOPY    . CORONARY ARTERY BYPASS GRAFT N/A  04/05/2015   Procedure: CORONARY ARTERY BYPASS GRAFTING (CABG)X4 LIMA-LAD; SVG-DIAG1-DIAG2; SVG-PD;  Surgeon: [name], MD;  Location: [place];  Service: Open Heart Surgery;  Laterality: N/A;  . CORONARY/GRAFT ANGIOGRAPHY N/A 04/22/2018   Procedure: CORONARY/GRAFT ANGIOGRAPHY;  Surgeon: Martinique, Peter M, MD;  Location: [place] CV LAB;  Service: Cardiovascular;  Laterality: N/A;  . CYSTOSCOPY    . ENDARTERECTOMY Left 04/24/2016   Procedure: ENDARTERECTOMY LEFT CAROTID;  Surgeon: Mal Misty, MD;  Location: [place];  Service: Vascular;  Laterality: Left;  . EYE SURGERY    . FEMORAL ARTERY - POPLITEAL ARTERY BYPASS GRAFT    . ICD IMPLANT N/A 12/30/2020   Procedure: ICD IMPLANT;  Surgeon: Evans Lance, MD;  Location: [place] CV LAB;  Service: Cardiovascular;  Laterality: N/A;  . JOINT REPLACEMENT     shoulder  . LEFT HEART CATHETERIZATION WITH CORONARY ANGIOGRAM N/A 04/03/2015   Procedure: LEFT HEART CATHETERIZATION WITH CORONARY ANGIOGRAM;  Surgeon: Troy Sine, MD;  Location: [facility] CATH LAB;  Service: Cardiovascular;  Laterality: N/A;  . LUMBAR LAMINECTOMY  01/06/2013   Procedure: MICRODISCECTOMY LUMBAR LAMINECTOMY;  Surgeon: Marybelle Killings, MD;  Location: [place];  Service: Orthopedics;  Laterality: N/A;  L3-4 decompression  . LUMBAR LAMINECTOMY/DECOMPRESSION MICRODISCECTOMY  02/12/2012   Procedure: LUMBAR LAMINECTOMY/DECOMPRESSION MICRODISCECTOMY;  Surgeon: Floyce Stakes, MD;  Location: [place] NEURO OR[facility];  Service: Neurosurgery;  Laterality: N/A;  Lumbar four-five laminectomy  . PATCH ANGIOPLASTY Left 04/24/2016   Procedure: LEFT CAROTID ARTERY PATCH ANGIOPLASTY;  Surgeon: Mal Misty, MD;  Location: [place];  Service: Vascular;  Laterality: Left;  . RIGHT HEART CATH AND CORONARY/GRAFT ANGIOGRAPHY N/A 01/30/2019   Procedure: RIGHT HEART CATH AND CORONARY/GRAFT ANGIOGRAPHY;  Surgeon: Troy Sine, MD;  Location: [place] CV LAB;  Service: Cardiovascular;  Laterality: N/A;  . STERIOD  INJECTION Right 01/09/2014   Procedure: STEROID INJECTION;  Surgeon: Mcarthur Rossetti, MD;  Location: [place];  Service: Orthopedics;  Laterality: Right;  . TEE WITHOUT CARDIOVERSION N/A 04/05/2015   Procedure: TRANSESOPHAGEAL ECHOCARDIOGRAM (TEE);  Surgeon: [name], MD;  Location: [place];  Service: Open Heart Surgery;  Laterality: N/A;  . TOTAL HIP ARTHROPLASTY Left 01/09/2014   DR Ninfa Linden  . TOTAL HIP ARTHROPLASTY Left 01/09/2014   Procedure: LEFT TOTAL HIP ARTHROPLASTY ANTERIOR APPROACH and Steroid Injection Right hip;  Surgeon: Mcarthur Rossetti, MD;  Location: [place];  Service: Orthopedics;  Laterality: Left;  . TOTAL HIP ARTHROPLASTY Right 08/15/2019  . TOTAL HIP ARTHROPLASTY Right 08/15/2019   Procedure: RIGHT TOTAL HIP ARTHROPLASTY ANTERIOR APPROACH;  Surgeon: Mcarthur Rossetti, MD;  Location: [place];  Service: Orthopedics;  Laterality: Right;    Current Medications: Current Meds  Medication Sig  . allopurinol (ZYLOPRIM) 100 MG tablet Take 100 mg by mouth daily as needed (gout).  Marland Kitchen amiodarone (PACERONE) 400 MG tablet Take 1 tablet (400  mg) twice daily, on 1/16 start taking 1 tablet once daily, on 1/23 start taking 0.5 tablet (200 mg) once daily.  Marland Kitchen apixaban (ELIQUIS) 5 MG TABS tablet TAKE 1 TABLET(5 MG) BY MOUTH TWICE DAILY. Resume 1/16 with am dose.  . bisoprolol (ZEBETA) 5 MG tablet Take 2.5 mg by mouth at bedtime.  . Evolocumab (REPATHA SURECLICK) [id number] MG/ML SOAJ Inject 1 pen into the skin every 14 (fourteen) days.  . furosemide (LASIX) 40 MG tablet Take 40 mg by mouth at bedtime.  Marland Kitchen HYDROcodone-acetaminophen (NORCO/VICODIN) 5-325 MG tablet Take 1 tablet by mouth every 6 (six) hours as needed for moderate pain.  . isosorbide mononitrate (IMDUR) 30 MG 24 hr tablet Take 1 tablet (30 mg total) by mouth daily.  Marland Kitchen levothyroxine (SYNTHROID) 50 MCG tablet Take 50 mcg by mouth See admin instructions. Every other night  . nitroGLYCERIN (NITROSTAT) 0.4 MG SL tablet  PLACE 1 TABLET UNDER THE TONGUE EVERY 5 MINUTES AS NEEDED FOR CHEST PAIN OR SHORTNESS OF BREATH  . oxyCODONE (OXY IR/ROXICODONE) 5 MG immediate release tablet Take 1-2 tablets (5-10 mg total) by mouth every 6 (six) hours as needed for moderate pain (pain score 4-6).  Marland Kitchen potassium chloride SA (KLOR-CON) 20 MEQ tablet Take 2 tablets (40 mEq total) by mouth daily.  . tamsulosin (FLOMAX) 0.4 MG CAPS capsule Take 0.4 mg by mouth at bedtime.  . traMADol (ULTRAM) 50 MG tablet Take by mouth as needed.  . [DISCONTINUED] acetaminophen (TYLENOL) 325 MG tablet Take 1-2 tablets (325-650 mg total) by mouth every 4 (four) hours as needed for mild pain.  . [DISCONTINUED] traMADol (ULTRAM) 50 MG tablet Take 1 tablet (50 mg total) by mouth 2 (two) times daily. (Patient taking differently: Take 50 mg by mouth every 6 (six) hours as needed for moderate pain.)     Allergies:   Zetia [ezetimibe], Codeine, and Unithroid [levothyroxine sodium]   Social History   Socioeconomic History  . Marital status: Married    Spouse name: Not on file  . Number of children: Not on file  . Years of education: Not on file  . Highest education level: Not on file  Occupational History  . Not on file  Tobacco Use  . Smoking status: Former Smoker    Types: Cigarettes    Quit date: 02/04/1987    Years since quitting: 33.9  . Smokeless tobacco: Former Systems developer    Types: Chew    Quit date: 07/20/2009  . Tobacco comment: quit 35+yrs ago  Vaping Use  . Vaping Use: Never used  Substance and Sexual Activity  . Alcohol use: No    Alcohol/week: 0.0 standard drinks  . Drug use: No  . Sexual activity: Not Currently  Other Topics Concern  . Not on file  Social History Narrative  . Not on file   Social Determinants of Health   Financial Resource Strain: Not on file  Food Insecurity: Not on file  Transportation Needs: Not on file  Physical Activity: Not on file  Stress: Not on file  Social Connections: Not on file     Family  History: The patient's family history includes Diabetes in his son; Heart attack in his father and sister; Heart disease in his father and sister; Hypertension in his mother and sister.    ROS:   Please see the history of present illness.    All other systems reviewed and are negative.   EKGs/Labs/Other Studies Reviewed:    The following studies were reviewed today:  Echo 12/28/20 1. Left ventricular ejection fraction, by estimation, is 35 to 40%. The  left ventricle has moderately decreased function. The left ventricle has  no regional wall motion abnormalities. There is moderate asymmetric left  ventricular hypertrophy. Left  ventricular diastolic function could not be evaluated.  2. Right ventricular systolic function is normal. The right ventricular  size is normal. There is mildly elevated pulmonary artery systolic  pressure.  3. Left atrial size was moderately dilated.  4. The mitral valve is grossly normal. No evidence of mitral valve  regurgitation.  5. The aortic valve is tricuspid. There is mild calcification of the  aortic valve. There is moderate thickening of the aortic valve. Aortic  valve regurgitation is not visualized.   Echo 01/2019 1. The left ventricle has low normal systolic function of [id number]. The  cavity size was normal. There is no increased left ventricular wall  thickness. Left ventricular diastology could not be evaluated due to  indeterminent diastolic function. Elevated  left ventricular end-diastolic pressure The E/e' is 76.  2. The right ventricle has mildly reduced systolic function. The cavity  was mildly enlarged. There is no increase in right ventricular wall  thickness. Right ventricular systolic pressure could not be assessed.  3. The tricuspid valve is normal in structure. Tricuspid valve  regurgitation is mild-moderate.  4. There is moderate calcification of the aortic valve without aortic  stenosis.  5. The mitral valve is normal  in structure. There is mild thickening and  mild calcification.   RIGHT HEART CATH AND CORONARY/GRAFT ANGIOGRAPHY  12/28/20    Conclusion    Ost 1st Diag lesion is 80% stenosed.  Prox RCA to Mid RCA lesion is 100% stenosed.  Ost Ramus to Ramus lesion is 90% stenosed.  LIMA and is normal in caliber.  The graft exhibits no disease.  SVG.  Origin to Prox Graft lesion is 100% stenosed.  SVG.  Origin to Prox Graft lesion is 100% stenosed.  1st Diag lesion is 95% stenosed.  Ost LAD to Prox LAD lesion is 50% stenosed.  Ost Cx to Prox Cx lesion is 65% stenosed.  Mid Cx to Dist Cx lesion is 85% stenosed.  Prox LAD lesion is 70% stenosed.  Prox LAD to Mid LAD lesion is 50% stenosed.   Severe native coronary artery disease with diffuse proximal to mid LAD stenosis with diffusely diseased native LAD proximally and mid with competitive filling from the LIMA graft; 90% proximal stenosis in the ramus immediate vessel, diffuse stenosis of 60 to 70% in the proximal circumflex with diffuse 80% distal stenosis and total occlusion of the AV groove after marginal vessel with total occlusion of the proximal native RCA.  There is extensive collateralization to the distal RCA both via the circumflex vessel as well as via the LIMA to LAD and distal LAD.  Patent LIMA graft supplying the mid LAD  Previously documented old occlusion of the previous Y graft which had supplied the ramus and marginal vessel in the vein graft which had supplied the PDA.  Low right heart pressures with mean PA pressure at 19 mmHg.  RECOMMENDATION: Increase medical therapy.  The patient has only been on low-dose anti-ischemic medical regimen consisting of bisoprolol 2.5 mg as well as isosorbide 60 mg.  Recommend initiation of amlodipine 5 mg.  Also recommend consideration of Ranexa 500 mg twice a day.  The patient is on rosuvastatin.  However if the patient is to be on atorvastatin, the maximum recommended dose of  atorvastatin is 40 mg if the patient is on ranolazine.  Diagnostic Dominance: Right     EKG:  EKG is  Not ordered today.   Recent Labs: 07/29/2020: ALT 13 12/28/2020: B Natriuretic Peptide 705.8; Magnesium 2.3 12/29/2020: TSH 6.705 12/30/2020: BUN 18; Creatinine, Ser 1.32; Potassium 4.5; Sodium 140 12/31/2020: Hemoglobin 13.9; Platelets 233  Recent Lipid Panel    Component Value Date/Time   CHOL 99 (L) 07/29/2020 1213   TRIG 72 07/29/2020 1213   HDL 39 (L) 07/29/2020 1213   CHOLHDL 2.5 07/29/2020 1213   CHOLHDL 3.8 02/08/2018 1003   VLDL 22 02/08/2018 1003   LDLCALC 45 07/29/2020 1213     Risk Assessment/Calculations:    CHA2DS2-VASc Score = 5   The patient's score is based upon: CHF History: Yes HTN History: Yes Diabetes History: No Stroke History: No Vascular Disease History: Yes Age Score: 2 Gender Score: 0     Physical Exam:    VS:  BP 120/60   Pulse (!) 59   Ht 5\' 6"  (1.676 m)   Wt 151 lb (68.5 kg)   SpO2 99%   BMI 24.37 kg/m     Wt Readings from Last 3 Encounters:  01/15/21 151 lb (68.5 kg)  12/31/20 154 lb 5.2 oz (70 kg)  07/29/20 161 lb 12.8 oz (73.4 kg)     GEN:  Well nourished, well developed in no acute distress HEENT: Normal NECK: No JVD; No carotid bruits LYMPHATICS: No lymphadenopathy CARDIAC: RRR, no murmurs, rubs, gallops RESPIRATORY:  Clear to auscultation without rales, wheezing or rhonchi  ABDOMEN: Soft, non-tender, non-distended MUSCULOSKELETAL:  No edema; No deformity  SKIN: Warm and dry NEUROLOGIC:  Alert and oriented x 3 PSYCHIATRIC:  Normal affect   ASSESSMENT AND PLAN:    1. CAD -No chest pain.  Not on aspirin due to need of anticoagulation.  Continue bisoprolol, Imdur and Repatha.  2. Chronic combined CHF - Most recent echo showed reduced LVEF to 35-40% (previously normal). During admission Dr. Cathie Olden recommended "He is not a good candidate for repeat heart catheterization so I would hold off on doing any heart cath   unless he had further episodes of pain or an ST segment elevation myocardial infarction." -Continue bisoprolol. -Will avoid adding advanced heart failure regimen currently due to orthostatic hypotension as below.  3.  Orthostatic hypotension -He did felt dizzy with drop in blood pressure -Recommended balance before movement.  Try compression stocking. -Continue bisoprolol 2.5 mg at bedtime -Likely reduce Lasix dose based on lab  Orthostatic VS for the past 24 hrs:  BP- Lying Pulse- Lying BP- Sitting Pulse- Sitting BP- Standing at 0 minutes Pulse- Standing at 0 minutes  01/15/21 1131 130/66 60 111/62 51 98/60 66    3. VT now S/p ICD -Continue amiodarone 200 mg daily - Followed by EP  4. CKD - Baseline scr 1.1-1.2  - Scr was 1.32 on 12/30/20 -Check renal function today  5 HLD - On Repatha  6. PAF - No bleeding issue Continue Eliquis    Medication Adjustments/Labs and Tests Ordered: Current medicines are reviewed at length with the patient today.  Concerns regarding medicines are outlined above.  Orders Placed This Encounter  Procedures  . Basic metabolic panel   No orders of the defined types were placed in this encounter.   Patient Instructions  Medication Instructions:  Your physician recommends that you continue on your current medications as directed. Please refer to the Current Medication list given to you today.  *  If you need a refill on your cardiac medications before your next appointment, please call your pharmacy*   Lab Work: TODAY:  BMET   If you have labs (blood work) drawn today and your tests are completely normal, you will receive your results only by: Marland Kitchen MyChart Message (if you have MyChart) OR . A paper copy in the mail If you have any lab test that is abnormal or we need to change your treatment, we will call you to review the results.   Testing/Procedures: None ordered   Follow-Up: At [facility], you and your health needs are our  priority.  As part of our continuing mission to provide you with exceptional heart care, we have created designated Provider Care Teams.  These Care Teams include your primary Cardiologist (physician) and Advanced Practice Providers (APPs -  Physician Assistants and Nurse Practitioners) who all work together to provide you with the care you need, when you need it.  We recommend signing up for the patient portal called "MyChart".  Sign up information is provided on this After Visit Summary.  MyChart is used to connect with patients for Virtual Visits (Telemedicine).  Patients are able to view lab/test results, encounter notes, upcoming appointments, etc.  Non-urgent messages can be sent to your provider as well.   To learn more about what you can do with MyChart, go to [email or web address].    Your next appointment:   2 month(s)  The format for your next appointment:   In Person  Provider:   Mertie Moores, MD    Other Instructions      Signed, Leanor Kail, PA  01/15/2021 11:48 AM    [place]

## 2021-01-16 ENCOUNTER — Telehealth: Payer: Self-pay | Admitting: *Deleted

## 2021-01-16 DIAGNOSIS — I25110 Atherosclerotic heart disease of native coronary artery with unstable angina pectoris: Secondary | ICD-10-CM

## 2021-01-16 DIAGNOSIS — I5042 Chronic combined systolic (congestive) and diastolic (congestive) heart failure: Secondary | ICD-10-CM

## 2021-01-16 LAB — BASIC METABOLIC PANEL
BUN/Creatinine Ratio: 16 (ref 10–24)
BUN: 25 mg/dL (ref 8–27)
CO2: 20 mmol/L (ref 20–29)
Calcium: 9.4 mg/dL (ref 8.6–10.2)
Chloride: 102 mmol/L (ref 96–106)
Creatinine, Ser: 1.54 mg/dL — ABNORMAL HIGH (ref 0.76–1.27)
GFR calc Af Amer: 49 mL/min/{1.73_m2} — ABNORMAL LOW (ref >59)
GFR calc non Af Amer: 42 mL/min/{1.73_m2} — ABNORMAL LOW (ref >59)
Glucose: 91 mg/dL (ref 65–99)
Potassium: 5.2 mmol/L (ref 3.5–5.2)
Sodium: 138 mmol/L (ref 134–144)

## 2021-01-16 MED ORDER — POTASSIUM CHLORIDE CRYS ER 20 MEQ PO TBCR: 20.0000 meq | EXTENDED_RELEASE_TABLET | Freq: Every day | ORAL | 3 refills | Status: DC

## 2021-01-16 MED ORDER — FUROSEMIDE 40 MG PO TABS
ORAL_TABLET | ORAL | 3 refills | Status: DC
Start: 2021-01-16 — End: 2021-03-23

## 2021-01-16 NOTE — Telephone Encounter (Signed)
-----   Message from [place], [place] sent at 01/16/2021 10:26 AM EST ----- Renal function minimally up from last check (seems high than baseline). Potassium normal at upper limit.   Reduce lasix to 40mg  MWF and 20mg   every other days.>> this should help with dizziness/orthostatic hypotension.    If he continues to have dizziness only on higher dose of Lasix Monday, Wednesday and Friday>> please give [place] call for recommendation.  Reduce Kdur to 20 meq daily.   Repeat BMP in 3 weeks.

## 2021-01-26 ENCOUNTER — Other Ambulatory Visit: Payer: Self-pay | Admitting: Cardiovascular Disease

## 2021-01-29 ENCOUNTER — Other Ambulatory Visit: Payer: Self-pay

## 2021-01-29 ENCOUNTER — Telehealth: Payer: Self-pay | Admitting: Cardiovascular Disease

## 2021-01-29 ENCOUNTER — Ambulatory Visit: Payer: Medicare Other | Admitting: Physician Assistant

## 2021-01-29 MED ORDER — ISOSORBIDE MONONITRATE ER 30 MG PO TB24: 30.0000 mg | ORAL_TABLET | Freq: Every day | ORAL | 3 refills | Status: DC

## 2021-01-29 NOTE — Telephone Encounter (Signed)
New Message:    Wife wants to know if Dr Acie Fredrickson would order the patches that goes behind the ear for motion sickness please.?

## 2021-01-29 NOTE — Telephone Encounter (Signed)
Keep track of BP /HR esp when dizzy  Keep log Cut KCL in 1/2 (10 mEq)   Take 1/2 tab daily    F?U BMET in 2 wks

## 2021-01-29 NOTE — Telephone Encounter (Signed)
Spoke with pt who continues to complain of intermittent dizziness.  Pt reports dizziness worse after he takes K+ supplement but will eventually resolve.  Pt denies CP, SOB, N&V or edema.  He states he does take his BP and HR regularly  And readings have been "normal" pt states he cannot remember what his BP was this morning but his HR was 65.  Pt would like to know if patches for dizziness would be appropriate.  Will forward information to provider for review and recommendation.  Reviewed ED precautions.  Pt verbalizes understanding and agrees with current plan.

## 2021-01-30 ENCOUNTER — Telehealth: Payer: Self-pay | Admitting: *Deleted

## 2021-01-30 DIAGNOSIS — R42 Dizziness and giddiness: Secondary | ICD-10-CM

## 2021-01-30 MED ORDER — POTASSIUM CHLORIDE CRYS ER 20 MEQ PO TBCR: 10.0000 meq | EXTENDED_RELEASE_TABLET | Freq: Every day | ORAL | 3 refills | Status: DC

## 2021-01-30 NOTE — Telephone Encounter (Signed)
Pt's wife aware or recommendations and verbalizes understanding./cy

## 2021-01-30 NOTE — Telephone Encounter (Signed)
Pt's wife aware of recommendations and agrees with plan ./cy

## 2021-02-07 ENCOUNTER — Other Ambulatory Visit: Payer: Self-pay

## 2021-02-07 ENCOUNTER — Other Ambulatory Visit: Payer: Medicare Other

## 2021-02-07 ENCOUNTER — Other Ambulatory Visit: Payer: Medicare Other | Admitting: *Deleted

## 2021-02-07 DIAGNOSIS — R42 Dizziness and giddiness: Secondary | ICD-10-CM | POA: Diagnosis not present

## 2021-02-07 LAB — BASIC METABOLIC PANEL
BUN/Creatinine Ratio: 18 (ref 10–24)
BUN: 22 mg/dL (ref 8–27)
CO2: 19 mmol/L — ABNORMAL LOW (ref 20–29)
Calcium: 9 mg/dL (ref 8.6–10.2)
Chloride: 103 mmol/L (ref 96–106)
Creatinine, Ser: 1.25 mg/dL (ref 0.76–1.27)
GFR calc Af Amer: 63 mL/min/{1.73_m2} (ref >59)
GFR calc non Af Amer: 54 mL/min/{1.73_m2} — ABNORMAL LOW (ref >59)
Glucose: 113 mg/dL — ABNORMAL HIGH (ref 65–99)
Potassium: 4.4 mmol/L (ref 3.5–5.2)
Sodium: 140 mmol/L (ref 134–144)

## 2021-02-13 ENCOUNTER — Other Ambulatory Visit: Payer: Medicare Other

## 2021-02-21 ENCOUNTER — Other Ambulatory Visit: Payer: Self-pay | Admitting: Cardiovascular Disease

## 2021-02-21 DIAGNOSIS — I5042 Chronic combined systolic (congestive) and diastolic (congestive) heart failure: Secondary | ICD-10-CM

## 2021-02-22 ENCOUNTER — Other Ambulatory Visit: Payer: Self-pay | Admitting: Cardiovascular Disease

## 2021-02-22 DIAGNOSIS — I480 Paroxysmal atrial fibrillation: Secondary | ICD-10-CM

## 2021-02-24 NOTE — Telephone Encounter (Signed)
Prescription refill request for Eliquis received. Indication: A Fib Last office visit: 01/15/21 Scr: 1.25 Age: 80 Weight: 68kg

## 2021-03-05 DIAGNOSIS — Z125 Encounter for screening for malignant neoplasm of prostate: Secondary | ICD-10-CM | POA: Diagnosis not present

## 2021-03-05 DIAGNOSIS — E785 Hyperlipidemia, unspecified: Secondary | ICD-10-CM | POA: Diagnosis not present

## 2021-03-05 DIAGNOSIS — M109 Gout, unspecified: Secondary | ICD-10-CM | POA: Diagnosis not present

## 2021-03-05 DIAGNOSIS — R7301 Impaired fasting glucose: Secondary | ICD-10-CM | POA: Diagnosis not present

## 2021-03-05 DIAGNOSIS — E039 Hypothyroidism, unspecified: Secondary | ICD-10-CM | POA: Diagnosis not present

## 2021-03-12 DIAGNOSIS — D6869 Other thrombophilia: Secondary | ICD-10-CM | POA: Diagnosis not present

## 2021-03-12 DIAGNOSIS — Z1331 Encounter for screening for depression: Secondary | ICD-10-CM | POA: Diagnosis not present

## 2021-03-12 DIAGNOSIS — E039 Hypothyroidism, unspecified: Secondary | ICD-10-CM | POA: Diagnosis not present

## 2021-03-12 DIAGNOSIS — I6529 Occlusion and stenosis of unspecified carotid artery: Secondary | ICD-10-CM | POA: Diagnosis not present

## 2021-03-12 DIAGNOSIS — Z0000 Encounter for general adult medical examination without abnormal findings: Secondary | ICD-10-CM | POA: Diagnosis not present

## 2021-03-12 DIAGNOSIS — I130 Hypertensive heart and chronic kidney disease with heart failure and stage 1 through stage 4 chronic kidney disease, or unspecified chronic kidney disease: Secondary | ICD-10-CM | POA: Diagnosis not present

## 2021-03-12 DIAGNOSIS — I5020 Unspecified systolic (congestive) heart failure: Secondary | ICD-10-CM | POA: Diagnosis not present

## 2021-03-12 DIAGNOSIS — I10 Essential (primary) hypertension: Secondary | ICD-10-CM | POA: Diagnosis not present

## 2021-03-12 DIAGNOSIS — Z1212 Encounter for screening for malignant neoplasm of rectum: Secondary | ICD-10-CM | POA: Diagnosis not present

## 2021-03-12 DIAGNOSIS — G720 Drug-induced myopathy: Secondary | ICD-10-CM | POA: Diagnosis not present

## 2021-03-12 DIAGNOSIS — R82998 Other abnormal findings in urine: Secondary | ICD-10-CM | POA: Diagnosis not present

## 2021-03-12 DIAGNOSIS — I25810 Atherosclerosis of coronary artery bypass graft(s) without angina pectoris: Secondary | ICD-10-CM | POA: Diagnosis not present

## 2021-03-12 DIAGNOSIS — R1319 Other dysphagia: Secondary | ICD-10-CM | POA: Diagnosis not present

## 2021-03-12 DIAGNOSIS — R7301 Impaired fasting glucose: Secondary | ICD-10-CM | POA: Diagnosis not present

## 2021-03-12 DIAGNOSIS — I700 Atherosclerosis of aorta: Secondary | ICD-10-CM | POA: Diagnosis not present

## 2021-03-12 DIAGNOSIS — N1831 Chronic kidney disease, stage 3a: Secondary | ICD-10-CM | POA: Diagnosis not present

## 2021-03-12 DIAGNOSIS — Z1339 Encounter for screening examination for other mental health and behavioral disorders: Secondary | ICD-10-CM | POA: Diagnosis not present

## 2021-03-17 ENCOUNTER — Ambulatory Visit (INDEPENDENT_AMBULATORY_CARE_PROVIDER_SITE_OTHER): Payer: Medicare Other | Admitting: Cardiovascular Disease

## 2021-03-17 ENCOUNTER — Other Ambulatory Visit: Payer: Self-pay

## 2021-03-17 ENCOUNTER — Encounter: Payer: Self-pay | Admitting: Cardiovascular Disease

## 2021-03-17 VITALS — BP 132/58 | HR 56 | Ht 66.0 in | Wt 161.8 lb

## 2021-03-17 DIAGNOSIS — I5042 Chronic combined systolic (congestive) and diastolic (congestive) heart failure: Secondary | ICD-10-CM | POA: Diagnosis not present

## 2021-03-17 DIAGNOSIS — I25110 Atherosclerotic heart disease of native coronary artery with unstable angina pectoris: Secondary | ICD-10-CM | POA: Diagnosis not present

## 2021-03-17 DIAGNOSIS — I480 Paroxysmal atrial fibrillation: Secondary | ICD-10-CM | POA: Diagnosis not present

## 2021-03-17 NOTE — Patient Instructions (Signed)
Medication Instructions:  Your physician recommends that you continue on your current medications as directed. Please refer to the Current Medication list given to you today.  *If you need a refill on your cardiac medications before your next appointment, please call your pharmacy*   Lab Work: none If you have labs (blood work) drawn today and your tests are completely normal, you will receive your results only by: Marland Kitchen MyChart Message (if you have MyChart) OR . A paper copy in the mail If you have any lab test that is abnormal or we need to change your treatment, we will call you to review the results.   Testing/Procedures: none   Follow-Up: At [facility], you and your health needs are our priority.  As part of our continuing mission to provide you with exceptional heart care, we have created designated Provider Care Teams.  These Care Teams include your primary Cardiologist (physician) and Advanced Practice Providers (APPs -  Physician Assistants and Nurse Practitioners) who all work together to provide you with the care you need, when you need it.  We recommend signing up for the patient portal called "MyChart".  Sign up information is provided on this After Visit Summary.  MyChart is used to connect with patients for Virtual Visits (Telemedicine).  Patients are able to view lab/test results, encounter notes, upcoming appointments, etc.  Non-urgent messages can be sent to your provider as well.   To learn more about what you can do with MyChart, go to [email or web address].    Your next appointment:   6 month(s)  The format for your next appointment:   In Person  Provider:   You may see Mertie Moores, MD or one of the following Advanced Practice Providers on your designated Care Team:    [name], PA-C  [place], [place]

## 2021-03-17 NOTE — Progress Notes (Signed)
Problem List  1. CAD - CABG  2. Essential Hypertension 3. Hyperlipidemia 4. Left carotid arterectomy  5. Atrial fib  6. Gout    Ryan [name] is a 80 y.o. man  With a history of hyperlipidemia, coronary artery disease, hypothyroidism  He was admitted March 31 with CP and dyspnea.   VQ was intermediate.   Was clinically thought to have a PE  So he was DC'd on NOAC. Was readmitted several days later with STEMI and cath revealed severe CAD. CT angio on April 16 showed no evidence of pulmonary embolus.   Oct. 4, 2016:  Ryan [name] is doing well.  Occasional episodes of orthostatic hypotension Is eating some salty snacks. Has lots of hip pain with walking  Encouraged him to try a stationary bike or elliptical  Had peripheral arterial duplex scan ( ? At [facility]) that was reportedly normal.   March 17, 2016  Thought he had some symptoms of food poisoning - occurred after eating .  Also may have developed back issues related to taking the crestor also  He stopped the crestor and the symptoms have resolved.   June 24, 2016:  Ryan [name] is seen today .   Spent the weekend at [facility]. - has a house there.  No CP or dyspnea.      March 10, 2017:  Ryan [name] is seen back after a recent hospitalization for new atrial fib .   Was started on amio and Eliquis  The echo shows an EF of 45%.  Moderate AS   Gets nausea after taking one of his meds.  Is now on Amio 400 BID,  Was also found to have gout  - was started on colchicine - feels better.  Is having some diarrhea from the colchicine   Is back in NSR   Aug. 14, 2018:  Ryan [name] is seen today for follow up of his CAD and atrial fib. He was hospitalized in April with urinary tract infection/pyelonephritis. He had mildly elevated troponin levels at that time. He also had acute kidney injury with a creatinine of 2.31 on admission. Creatinine has improved to 1.41  Had mildly elevated troponin levels at that time I had talked to Dr. Broadus John  about doing a Leane Call .   Still gets fatigued frequently . Has claudication .  Used to see Dr. Kellie Simmering.     Apr 20, 2018: Ryan [name] was admitted to the hospital in February, 2019 with chest pain. Took 1 sublingual nitroglycerin and 1 hydrocodone which relieved his chest pain.  Troponin levels were mildly elevated at 0.07, 0.08. Stress Myoview revealed a very small area of inferior ischemia. She did well and we decided to treat him medically.  He did not want to have a heart catheterization at that time.  He seems to have mildly elevated troponin levels on a chronic basis.  Has had recurrent CP  - one night he thought he would need to go to the hospital . He has stopped his rosuvastatin  and is taking just 1/2 of a Bisoprolol .  Has had progressive shortness of breath  Now he wants to have a cath   Aug. 1, 2019:  Ryan [name] is seen today for follow up visit .  No recent episodes of CP  Has lots of bruising / bleeding  - due to Eliquis therapy Has not been taking his synthroid for the past several months   January 18, 2020:  Ryan [name] is seen today for follow-up of his coronary artery  disease and atrial fibrillation. Occasional gas pain .  Relieved by passing gas or belching  Breathing is ok  Is now on Repatha  Has dizzy spells .   Symptoms are orthostatic hypotension  Is walking up and down his driveway - seems to be doing well with the exercise   July 29, 2020:  Is having dysuria.   Difficulty in urinating - asked him to go to [facility] and give them a urine sample Started 5 days ago.   Lasix makes it worse  Has some chest pain  Has lost his appetite  Gets fatigued after taking his meds.    March 17, 2021: Ryan [name] is seen for follow up visit  BP is well controlled.  Feels well today     Medications Current Outpatient Medications  Medication Sig Dispense Refill  . allopurinol (ZYLOPRIM) 100 MG tablet Take 100 mg by mouth daily as needed (gout).    Marland Kitchen amiodarone  (PACERONE) 400 MG tablet Take 1 tablet (400 mg) twice daily, on 1/16 start taking 1 tablet once daily, on 1/23 start taking 0.5 tablet (200 mg) once daily. 45 tablet 3  . bisoprolol (ZEBETA) 5 MG tablet TAKE 1/2 TABLET(2.5 MG) BY MOUTH DAILY 45 tablet 3  . ELIQUIS 5 MG TABS tablet TAKE 1 TABLET(5 MG) BY MOUTH TWICE DAILY 90 tablet 1  . Evolocumab (REPATHA SURECLICK) 459 MG/ML SOAJ Inject 1 pen into the skin every 14 (fourteen) days. 2 mL 11  . furosemide (LASIX) 40 MG tablet Take 1 tablet by mouth on Mondays, Wednesdays, & Fridays, take 1/2 tablet by mouth on all other days 90 tablet 3  . HYDROcodone-acetaminophen (NORCO/VICODIN) 5-325 MG tablet Take 1 tablet by mouth every 6 (six) hours as needed for moderate pain.    . isosorbide mononitrate (IMDUR) 30 MG 24 hr tablet Take 1 tablet (30 mg total) by mouth daily. 90 tablet 3  . levothyroxine (SYNTHROID) 50 MCG tablet Take 50 mcg by mouth See admin instructions. Every other night    . nitroGLYCERIN (NITROSTAT) 0.4 MG SL tablet PLACE 1 TABLET UNDER THE TONGUE EVERY 5 MINUTES AS NEEDED FOR CHEST PAIN OR SHORTNESS OF BREATH 25 tablet 6  . oxyCODONE (OXY IR/ROXICODONE) 5 MG immediate release tablet Take 1-2 tablets (5-10 mg total) by mouth every 6 (six) hours as needed for moderate pain (pain score 4-6). 30 tablet 0  . potassium chloride SA (KLOR-CON) 20 MEQ tablet Take 0.5 tablets (10 mEq total) by mouth daily. 90 tablet 3  . tamsulosin (FLOMAX) 0.4 MG CAPS capsule Take 0.4 mg by mouth at bedtime.    . traMADol (ULTRAM) 50 MG tablet Take by mouth as needed.     No current facility-administered medications for this visit.   Review of systems: Noted in current history, otherwise review of systems is negative.  Physical Exam: Blood pressure (!) 132/58, pulse (!) 56, height 5\' 6"  (1.676 m), weight 161 lb 12.8 oz (73.4 kg), SpO2 98 %.  GEN:   Elderly male  HEENT: Normal NECK: No JVD; No carotid bruits LYMPHATICS: No lymphadenopathy CARDIAC: irreg.  Irreg,   Soft systolic murmur  RESPIRATORY:  Clear to auscultation without rales, wheezing or rhonchi  ABDOMEN: Soft, non-tender, non-distended MUSCULOSKELETAL:  No edema; No deformity  SKIN: Warm and dry NEUROLOGIC:  Alert and oriented x 3   ECG :    Lab Results:  No results for input(s): WBC, HGB, HCT, PLT in the last 72 hours. BMET No results for input(s): NA, K,  CL, CO2, GLUCOSE, BUN, CREATININE, CALCIUM in the last 72 hours.  Assessment/Plan    1. Paroxysmal Atrial fib:     Is in Afib today  . Cont Eliquis   2. CAD:    No angina   3. Hyperlipidemia -      Lipid are stable   4.  Chronic combined chf:   Echo from Jan. 2022 shows EF 35-40%.     Stable.   It has been difficult to keep him on standard CHF meds Will see him in 6 months   5..  Essential hypertension-  BP is well controlled.   6.   Ventricular tachycardia:   S/p ICD:   Doing well ,  Follows with Dr. Jessee Avers, Brooke Bonito., MD, [facility] 03/17/2021, 11:07 AM [address],  [place] Pager [phone]

## 2021-03-21 ENCOUNTER — Observation Stay (HOSPITAL_COMMUNITY)
Admission: EM | Admit: 2021-03-21 | Discharge: 2021-03-23 | Disposition: A | Discharge status: Home / Self Care | Payer: Medicare Other | Attending: Internal Medicine | Admitting: Internal Medicine

## 2021-03-21 ENCOUNTER — Other Ambulatory Visit: Payer: Self-pay

## 2021-03-21 DIAGNOSIS — E039 Hypothyroidism, unspecified: Secondary | ICD-10-CM | POA: Diagnosis not present

## 2021-03-21 DIAGNOSIS — D72829 Elevated white blood cell count, unspecified: Secondary | ICD-10-CM | POA: Diagnosis not present

## 2021-03-21 DIAGNOSIS — I10 Essential (primary) hypertension: Secondary | ICD-10-CM | POA: Diagnosis present

## 2021-03-21 DIAGNOSIS — Z951 Presence of aortocoronary bypass graft: Secondary | ICD-10-CM

## 2021-03-21 DIAGNOSIS — R079 Chest pain, unspecified: Secondary | ICD-10-CM | POA: Diagnosis not present

## 2021-03-21 DIAGNOSIS — Z87891 Personal history of nicotine dependence: Secondary | ICD-10-CM | POA: Insufficient documentation

## 2021-03-21 DIAGNOSIS — I2510 Atherosclerotic heart disease of native coronary artery without angina pectoris: Secondary | ICD-10-CM | POA: Insufficient documentation

## 2021-03-21 DIAGNOSIS — J439 Emphysema, unspecified: Secondary | ICD-10-CM | POA: Diagnosis not present

## 2021-03-21 DIAGNOSIS — I480 Paroxysmal atrial fibrillation: Secondary | ICD-10-CM | POA: Insufficient documentation

## 2021-03-21 DIAGNOSIS — E038 Other specified hypothyroidism: Secondary | ICD-10-CM | POA: Diagnosis present

## 2021-03-21 DIAGNOSIS — Z20822 Contact with and (suspected) exposure to covid-19: Secondary | ICD-10-CM | POA: Insufficient documentation

## 2021-03-21 DIAGNOSIS — Z7901 Long term (current) use of anticoagulants: Secondary | ICD-10-CM | POA: Diagnosis not present

## 2021-03-21 DIAGNOSIS — Z79899 Other long term (current) drug therapy: Secondary | ICD-10-CM | POA: Diagnosis not present

## 2021-03-21 DIAGNOSIS — I739 Peripheral vascular disease, unspecified: Secondary | ICD-10-CM | POA: Diagnosis present

## 2021-03-21 DIAGNOSIS — I509 Heart failure, unspecified: Secondary | ICD-10-CM | POA: Diagnosis not present

## 2021-03-21 DIAGNOSIS — N183 Chronic kidney disease, stage 3 unspecified: Secondary | ICD-10-CM | POA: Diagnosis not present

## 2021-03-21 DIAGNOSIS — N189 Chronic kidney disease, unspecified: Secondary | ICD-10-CM | POA: Diagnosis present

## 2021-03-21 DIAGNOSIS — I110 Hypertensive heart disease with heart failure: Secondary | ICD-10-CM | POA: Diagnosis not present

## 2021-03-21 DIAGNOSIS — E785 Hyperlipidemia, unspecified: Secondary | ICD-10-CM | POA: Diagnosis present

## 2021-03-21 DIAGNOSIS — N179 Acute kidney failure, unspecified: Secondary | ICD-10-CM | POA: Diagnosis present

## 2021-03-21 DIAGNOSIS — Z95810 Presence of automatic (implantable) cardiac defibrillator: Secondary | ICD-10-CM | POA: Diagnosis present

## 2021-03-21 DIAGNOSIS — I472 Ventricular tachycardia, unspecified: Secondary | ICD-10-CM

## 2021-03-21 DIAGNOSIS — I5021 Acute systolic (congestive) heart failure: Secondary | ICD-10-CM | POA: Insufficient documentation

## 2021-03-21 DIAGNOSIS — I130 Hypertensive heart and chronic kidney disease with heart failure and stage 1 through stage 4 chronic kidney disease, or unspecified chronic kidney disease: Secondary | ICD-10-CM | POA: Diagnosis not present

## 2021-03-21 DIAGNOSIS — I4819 Other persistent atrial fibrillation: Secondary | ICD-10-CM | POA: Diagnosis present

## 2021-03-21 DIAGNOSIS — R0602 Shortness of breath: Secondary | ICD-10-CM | POA: Diagnosis present

## 2021-03-21 NOTE — ED Triage Notes (Signed)
Pt reports SOB started 3 days ago and worse tonight, left sides chest pain radiating to the back of his shoulder with hx of 3 MI. Pt states also having difficulty urination

## 2021-03-21 NOTE — ED Provider Notes (Signed)
MSE was initiated and I personally evaluated the patient and placed orders (if any) at  12:10 AM on March 22, 2021.  The patient appears stable so that the remainder of the MSE may be completed by another provider.  80 year old male with an extensive cardiac history including CAD s/p CABG x4, chronic combined systolic and diastolic heart failure, PVD, carotid artery disease, urinary retention ventricular tachycardia s/p AICD who presents to the emergency department with a chief complaint of shortness of breath.  The patient endorses shortness of breath that has been gradually worsening over the last 3 to 4 days until tonight when his symptoms significantly worsened.  He has been having intermittent left-sided sharp chest pain that radiates through to his back, but has had a constant episode since approximately 22:00.  He endorses a nonproductive cough.  He also feels as if he is unable to urinate.  He was able to pass a small amount of urine earlier tonight.  He reports a history of a similar episode with urinary retention when he was in CHF.  He does reports that his home daily weights have been up trending and he has noticed worsening swelling in his legs despite his home Lasix.  No recent missed doses.  No fever, chills, vomiting.   On exam, patient has increased work of breathing.  Oxygen saturation 90 to 91% and patient was placed on 2 L due to accessory muscle use and tachypnea.  Crackles in the bilateral bases.  Nontoxic-appearing.  Standing orders for chest pain and shortness of breath have been placed by RN.  He does have a prolonged QTC on his EKG and will add on magnesium as well as BNP as there is concern for volume overload based on his history.  Discussed with patient that there care has been initiated.   Patient has been counseled on need to stay for full evaluation.  Risks of leaving the emergency department prior to completion of treatment were discussed. Patient was advised to inform ED  staff if they are leaving before treatment is complete. The patient acknowledged these risks and time was allowed for questions.      Joanne Gavel, PA-C 03/22/21 0011    Ezequiel Essex, MD 03/22/21 [phone]

## 2021-03-22 ENCOUNTER — Emergency Department (HOSPITAL_COMMUNITY): Payer: Medicare Other

## 2021-03-22 ENCOUNTER — Other Ambulatory Visit: Payer: Self-pay

## 2021-03-22 ENCOUNTER — Encounter (HOSPITAL_COMMUNITY): Payer: Self-pay

## 2021-03-22 DIAGNOSIS — N189 Chronic kidney disease, unspecified: Secondary | ICD-10-CM | POA: Diagnosis not present

## 2021-03-22 DIAGNOSIS — Z95810 Presence of automatic (implantable) cardiac defibrillator: Secondary | ICD-10-CM | POA: Diagnosis not present

## 2021-03-22 DIAGNOSIS — I5021 Acute systolic (congestive) heart failure: Secondary | ICD-10-CM | POA: Diagnosis not present

## 2021-03-22 DIAGNOSIS — I5043 Acute on chronic combined systolic (congestive) and diastolic (congestive) heart failure: Secondary | ICD-10-CM

## 2021-03-22 DIAGNOSIS — I472 Ventricular tachycardia: Secondary | ICD-10-CM | POA: Diagnosis not present

## 2021-03-22 DIAGNOSIS — I509 Heart failure, unspecified: Secondary | ICD-10-CM

## 2021-03-22 DIAGNOSIS — I10 Essential (primary) hypertension: Secondary | ICD-10-CM | POA: Diagnosis not present

## 2021-03-22 DIAGNOSIS — Z951 Presence of aortocoronary bypass graft: Secondary | ICD-10-CM | POA: Diagnosis not present

## 2021-03-22 DIAGNOSIS — R0602 Shortness of breath: Secondary | ICD-10-CM | POA: Diagnosis not present

## 2021-03-22 DIAGNOSIS — R079 Chest pain, unspecified: Secondary | ICD-10-CM | POA: Diagnosis not present

## 2021-03-22 DIAGNOSIS — I739 Peripheral vascular disease, unspecified: Secondary | ICD-10-CM | POA: Diagnosis not present

## 2021-03-22 DIAGNOSIS — E785 Hyperlipidemia, unspecified: Secondary | ICD-10-CM | POA: Diagnosis not present

## 2021-03-22 DIAGNOSIS — E038 Other specified hypothyroidism: Secondary | ICD-10-CM | POA: Diagnosis not present

## 2021-03-22 DIAGNOSIS — J439 Emphysema, unspecified: Secondary | ICD-10-CM | POA: Diagnosis not present

## 2021-03-22 DIAGNOSIS — N179 Acute kidney failure, unspecified: Secondary | ICD-10-CM | POA: Diagnosis not present

## 2021-03-22 DIAGNOSIS — I480 Paroxysmal atrial fibrillation: Secondary | ICD-10-CM | POA: Diagnosis not present

## 2021-03-22 LAB — CBC
HCT: 48.8 % (ref 39.0–52.0)
Hemoglobin: 15.8 g/dL (ref 13.0–17.0)
MCH: 32.9 pg (ref 26.0–34.0)
MCHC: 32.4 g/dL (ref 30.0–36.0)
MCV: 101.7 fL — ABNORMAL HIGH (ref 80.0–100.0)
Platelets: 307 10*3/uL (ref 150–400)
RBC: 4.8 MIL/uL (ref 4.22–5.81)
RDW: 13.9 % (ref 11.5–15.5)
WBC: 11.4 10*3/uL — ABNORMAL HIGH (ref 4.0–10.5)
nRBC: 0 % (ref 0.0–0.2)

## 2021-03-22 LAB — BASIC METABOLIC PANEL
Anion gap: 8 (ref 5–15)
BUN: 19 mg/dL (ref 8–23)
CO2: 27 mmol/L (ref 22–32)
Calcium: 9 mg/dL (ref 8.9–10.3)
Chloride: 105 mmol/L (ref 98–111)
Creatinine, Ser: 1.52 mg/dL — ABNORMAL HIGH (ref 0.61–1.24)
GFR, Estimated: 46 mL/min — ABNORMAL LOW (ref >60)
Glucose, Bld: 158 mg/dL — ABNORMAL HIGH (ref 70–99)
Potassium: 4 mmol/L (ref 3.5–5.1)
Sodium: 140 mmol/L (ref 135–145)

## 2021-03-22 LAB — URINALYSIS, ROUTINE W REFLEX MICROSCOPIC
Bilirubin Urine: NEGATIVE
Glucose, UA: NEGATIVE mg/dL
Ketones, ur: NEGATIVE mg/dL
Leukocytes,Ua: NEGATIVE
Nitrite: NEGATIVE
Protein, ur: NEGATIVE mg/dL
Specific Gravity, Urine: 1.006 (ref 1.005–1.030)
pH: 6 (ref 5.0–8.0)

## 2021-03-22 LAB — BRAIN NATRIURETIC PEPTIDE: B Natriuretic Peptide: 1273.3 pg/mL — ABNORMAL HIGH (ref 0.0–100.0)

## 2021-03-22 LAB — RESP PANEL BY RT-PCR (FLU A&B, COVID) ARPGX2
Influenza A by PCR: NEGATIVE
Influenza B by PCR: NEGATIVE
SARS Coronavirus 2 by RT PCR: NEGATIVE

## 2021-03-22 LAB — MRSA PCR SCREENING: MRSA by PCR: NEGATIVE

## 2021-03-22 LAB — PROTIME-INR
INR: 1.4 — ABNORMAL HIGH (ref 0.8–1.2)
Prothrombin Time: 16.3 seconds — ABNORMAL HIGH (ref 11.4–15.2)

## 2021-03-22 LAB — TROPONIN I (HIGH SENSITIVITY)
Troponin I (High Sensitivity): 46 ng/L — ABNORMAL HIGH (ref <18)
Troponin I (High Sensitivity): 56 ng/L — ABNORMAL HIGH (ref <18)

## 2021-03-22 LAB — T4, FREE: Free T4: 1.04 ng/dL (ref 0.61–1.12)

## 2021-03-22 LAB — TSH: TSH: 7.434 u[IU]/mL — ABNORMAL HIGH (ref 0.350–4.500)

## 2021-03-22 LAB — MAGNESIUM: Magnesium: 2.2 mg/dL (ref 1.7–2.4)

## 2021-03-22 MED ORDER — FUROSEMIDE 10 MG/ML IJ SOLN
40.0000 mg | Freq: Two times a day (BID) | INTRAMUSCULAR | Status: DC
  Administered 2021-03-22: 40 mg via INTRAVENOUS
  Filled 2021-03-22: qty 4

## 2021-03-22 MED ORDER — ACETAMINOPHEN 325 MG PO TABS: 650.0000 mg | ORAL_TABLET | ORAL | Status: DC | PRN

## 2021-03-22 MED ORDER — SODIUM CHLORIDE 0.9% FLUSH
3.0000 mL | Freq: Two times a day (BID) | INTRAVENOUS | Status: DC
  Administered 2021-03-22 – 2021-03-23 (×3): 3 mL via INTRAVENOUS

## 2021-03-22 MED ORDER — FUROSEMIDE 10 MG/ML IJ SOLN
40.0000 mg | Freq: Once | INTRAMUSCULAR | Status: AC
  Administered 2021-03-22: 40 mg via INTRAVENOUS
  Filled 2021-03-22: qty 4

## 2021-03-22 MED ORDER — TAMSULOSIN HCL 0.4 MG PO CAPS
0.4000 mg | ORAL_CAPSULE | Freq: Every day | ORAL | Status: DC
  Administered 2021-03-22: 0.4 mg via ORAL
  Filled 2021-03-22: qty 1

## 2021-03-22 MED ORDER — SODIUM CHLORIDE 0.9% FLUSH: 3.0000 mL | INTRAVENOUS | Status: DC | PRN

## 2021-03-22 MED ORDER — ONDANSETRON HCL 4 MG/2ML IJ SOLN: 4.0000 mg | Freq: Four times a day (QID) | INTRAMUSCULAR | Status: DC | PRN

## 2021-03-22 MED ORDER — POTASSIUM CHLORIDE CRYS ER 10 MEQ PO TBCR
10.0000 meq | EXTENDED_RELEASE_TABLET | Freq: Every day | ORAL | Status: DC
  Administered 2021-03-22 – 2021-03-23 (×2): 10 meq via ORAL
  Filled 2021-03-22 (×2): qty 1

## 2021-03-22 MED ORDER — ALBUTEROL SULFATE (2.5 MG/3ML) 0.083% IN NEBU: 2.5000 mg | INHALATION_SOLUTION | Freq: Four times a day (QID) | RESPIRATORY_TRACT | Status: DC | PRN

## 2021-03-22 MED ORDER — AMIODARONE HCL 200 MG PO TABS
200.0000 mg | ORAL_TABLET | Freq: Every day | ORAL | Status: DC
  Administered 2021-03-22 – 2021-03-23 (×2): 200 mg via ORAL
  Filled 2021-03-22 (×2): qty 1

## 2021-03-22 MED ORDER — ALLOPURINOL 100 MG PO TABS: 100.0000 mg | ORAL_TABLET | Freq: Every day | ORAL | Status: DC | PRN

## 2021-03-22 MED ORDER — BISOPROLOL FUMARATE 5 MG PO TABS
2.5000 mg | ORAL_TABLET | Freq: Every day | ORAL | Status: DC
  Filled 2021-03-22: qty 0.5

## 2021-03-22 MED ORDER — SODIUM CHLORIDE 0.9 % IV SOLN: 250.0000 mL | INTRAVENOUS | Status: DC | PRN

## 2021-03-22 MED ORDER — CHLORHEXIDINE GLUCONATE CLOTH 2 % EX PADS
6.0000 | MEDICATED_PAD | Freq: Every day | CUTANEOUS | Status: DC
  Administered 2021-03-22: 6 via TOPICAL

## 2021-03-22 MED ORDER — ASPIRIN 81 MG PO CHEW
324.0000 mg | CHEWABLE_TABLET | Freq: Once | ORAL | Status: AC
  Administered 2021-03-22: 324 mg via ORAL
  Filled 2021-03-22: qty 4

## 2021-03-22 MED ORDER — ISOSORBIDE MONONITRATE ER 30 MG PO TB24
30.0000 mg | ORAL_TABLET | Freq: Every evening | ORAL | Status: DC
  Administered 2021-03-22: 30 mg via ORAL
  Filled 2021-03-22: qty 1

## 2021-03-22 MED ORDER — APIXABAN 5 MG PO TABS
5.0000 mg | ORAL_TABLET | Freq: Two times a day (BID) | ORAL | Status: DC
  Administered 2021-03-22 – 2021-03-23 (×3): 5 mg via ORAL
  Filled 2021-03-22 (×3): qty 1

## 2021-03-22 NOTE — H&P (Addendum)
History and Physical    Ryan [name]:[id number] DOB: [birth year] DOA: 03/21/2021  Referring MD/NP/PA: Kennith Maes, PA-C PCP: Ginger Organ., MD  Patient coming from:  Home   Chief Complaint: Shortness of breath and cough  I have personally briefly reviewed patient's old medical records in [place]   HPI: Ryan [name] is a 80 y.o. male with medical history significant of hypertension, hyperlipidemia, CAD s/p CABG, systolic CHF, PVD, A. fib, VT s/p AICD, DVT/PE on Eliquis, hypothyroidism, and GERD notes with complaints of shortness of breath and cough. He had just went to bed last night around 10:30 PM and when he turned over to his side began coughing and acutely got short of breath.  Noted associated symptoms of feeling hot, heart racing, nausea feeling as though he wanted to vomit, and had some substernal chest pain that did not radiate.  He had tried opening the door to let the cool air come in to see if it would help, but it provided no relief.  Patient reports that his weight just got back to around 160 pounds from his prior hospitalization.  He was hospitalized in January of this year after being found to be in monomorphic ventricular tachycardia requiring cardioversion and subsequent AICD placement.  Symptoms that he felt last night felt very similar to when he came into the hospital January and therefore called EMS.  Denies that he ever received any shocks from his AICD.  He currently has the urge to urinate, but is incompletely voiding.  He had just had a follow-up appointment with Dr. Katharina Caper of cardiology on 03/17/2021.  He forgot to mention that he had been hearing crackling in his throat specifically when he would lay down at night over the last month.  Related symptoms to what sounds like esophageal stenosis and the possibility of it needing to be stretched.  ED Course: On admission patient was afebrile with pulse 81-103, respiration 14-24, blood pressures  119/102 185/87, and O2 saturation maintained.  Labs significant for WBC 11.4, BUN 19, creatinine 1.59, BNP 1273.3, and high-sensitivity troponin 46->56.  Significant for cardiac enlargement with vascular congestion and interstitial edema.  Patient was given full dose aspirin and furosemide 40 mg IV.  TRH called to admit for CHF exacerbation.  Review of Systems  Constitutional: Positive for malaise/fatigue.  HENT: Negative for nosebleeds and sinus pain.   Eyes: Negative for double vision and photophobia.  Respiratory: Positive for cough and shortness of breath.   Cardiovascular: Positive for chest pain and palpitations. Negative for claudication and leg swelling.  Gastrointestinal: Positive for abdominal pain and nausea. Negative for vomiting.  Genitourinary: Positive for urgency. Negative for hematuria.  Musculoskeletal: Negative for falls.  Skin: Negative for rash.  Neurological: Positive for dizziness. Negative for focal weakness and loss of consciousness.  Psychiatric/Behavioral: Negative for substance abuse. The patient has insomnia.     Past Medical History:  Diagnosis Date  . Arthritis   . CAD (coronary artery disease)   . Carotid artery occlusion   . Cataract    Bil eyes/worse in left eye  . CHF (congestive heart failure) ([place])   . Chronic back pain   . DVT (deep venous thrombosis) ([place])   . Dysrhythmia   . Enlarged prostate    takes Rapaflo daily  . GERD (gastroesophageal reflux disease)    occasional  . History of colon polyps   . History of gout    has colchicine prn  . History of  kidney stones   . Hyperlipidemia    takes Crestor daily  . Hypertension    takes Amlodipine daily  . Hypothyroidism   . Myocardial infarction ([place])   . Peripheral vascular disease ([place])   . Pulmonary emboli ([place]) 03/20/2015   elevated d-dimer, intermediate V/Q study, atypical chest pain and SOB. Start on Xarelto 20mg  BID for 3 month  . Rapid atrial fibrillation ([place])   . Renal  insufficiency   . Shortness of breath dyspnea   . Urinary frequency   . Urinary urgency     Past Surgical History:  Procedure Laterality Date  . APPENDECTOMY    . BACK SURGERY     5 times  . big toe surgery    . CARDIAC CATHETERIZATION     2010    dr Acie Fredrickson  . cataract surgery     left eye  . CHOLECYSTECTOMY N/A 07/27/2016   Procedure: LAPAROSCOPIC CHOLECYSTECTOMY;  Surgeon: Mickeal Skinner, MD;  Location: [facility];  Service: General;  Laterality: N/A;  . COLONOSCOPY    . CORONARY ARTERY BYPASS GRAFT N/A 04/05/2015   Procedure: CORONARY ARTERY BYPASS GRAFTING (CABG)X4 LIMA-LAD; SVG-DIAG1-DIAG2; SVG-PD;  Surgeon: [name], MD;  Location: [place];  Service: Open Heart Surgery;  Laterality: N/A;  . CORONARY/GRAFT ANGIOGRAPHY N/A 04/22/2018   Procedure: CORONARY/GRAFT ANGIOGRAPHY;  Surgeon: Martinique, Peter M, MD;  Location: [facility];  Service: Cardiovascular;  Laterality: N/A;  . CYSTOSCOPY    . ENDARTERECTOMY Left 04/24/2016   Procedure: ENDARTERECTOMY LEFT CAROTID;  Surgeon: Mal Misty, MD;  Location: [facility];  Service: Vascular;  Laterality: Left;  . EYE SURGERY    . FEMORAL ARTERY - POPLITEAL ARTERY BYPASS GRAFT    . ICD IMPLANT N/A 12/30/2020   Procedure: ICD IMPLANT;  Surgeon: Evans Lance, MD;  Location: [facility];  Service: Cardiovascular;  Laterality: N/A;  . JOINT REPLACEMENT     shoulder  . LEFT HEART CATHETERIZATION WITH CORONARY ANGIOGRAM N/A 04/03/2015   Procedure: LEFT HEART CATHETERIZATION WITH CORONARY ANGIOGRAM;  Surgeon: Troy Sine, MD;  Location: [facility] CATH [facility];  Service: Cardiovascular;  Laterality: N/A;  . LUMBAR LAMINECTOMY  01/06/2013   Procedure: MICRODISCECTOMY LUMBAR LAMINECTOMY;  Surgeon: Marybelle Killings, MD;  Location: [place];  Service: Orthopedics;  Laterality: N/A;  L3-4 decompression  . LUMBAR LAMINECTOMY/DECOMPRESSION MICRODISCECTOMY  02/12/2012   Procedure: LUMBAR LAMINECTOMY/DECOMPRESSION MICRODISCECTOMY;  Surgeon: Floyce Stakes, MD;  Location: [place] [facility];  Service: Neurosurgery;  Laterality: N/A;  Lumbar four-five laminectomy  . PATCH ANGIOPLASTY Left 04/24/2016   Procedure: LEFT CAROTID ARTERY PATCH ANGIOPLASTY;  Surgeon: Mal Misty, MD;  Location: [place];  Service: Vascular;  Laterality: Left;  . RIGHT HEART CATH AND CORONARY/GRAFT ANGIOGRAPHY N/A 01/30/2019   Procedure: RIGHT HEART CATH AND CORONARY/GRAFT ANGIOGRAPHY;  Surgeon: Troy Sine, MD;  Location: [place] [facility];  Service: Cardiovascular;  Laterality: N/A;  . STERIOD INJECTION Right 01/09/2014   Procedure: STEROID INJECTION;  Surgeon: Mcarthur Rossetti, MD;  Location: [place];  Service: Orthopedics;  Laterality: Right;  . TEE WITHOUT CARDIOVERSION N/A 04/05/2015   Procedure: TRANSESOPHAGEAL ECHOCARDIOGRAM (TEE);  Surgeon: [name], MD;  Location: [facility];  Service: Open Heart Surgery;  Laterality: N/A;  . TOTAL HIP ARTHROPLASTY Left 01/09/2014   DR Ninfa Linden  . TOTAL HIP ARTHROPLASTY Left 01/09/2014   Procedure: LEFT TOTAL HIP ARTHROPLASTY ANTERIOR APPROACH and Steroid Injection Right hip;  Surgeon: Mcarthur Rossetti, MD;  Location: [place];  Service: Orthopedics;  Laterality: Left;  . TOTAL HIP ARTHROPLASTY Right 08/15/2019  . TOTAL HIP ARTHROPLASTY Right 08/15/2019   Procedure: RIGHT TOTAL HIP ARTHROPLASTY ANTERIOR APPROACH;  Surgeon: Mcarthur Rossetti, MD;  Location: [place];  Service: Orthopedics;  Laterality: Right;     reports that he quit smoking about 34 years ago. His smoking use included cigarettes. He quit smokeless tobacco use about 11 years ago.  His smokeless tobacco use included chew. He reports that he does not drink alcohol and does not use drugs.  Allergies  Allergen Reactions  . Zetia [Ezetimibe] Other (See Comments)    Myalgias   . Codeine Nausea And Vomiting       . Unithroid [Levothyroxine Sodium] Other (See Comments)    Caused blurry vision per pt    Family History  Problem Relation Age of  Onset  . Heart disease Father   . Heart attack Father   . Heart disease Sister   . Hypertension Sister   . Heart attack Sister   . Hypertension Mother   . Diabetes Son     Prior to Admission medications   Medication Sig Start Date End Date Taking? Authorizing Provider  allopurinol (ZYLOPRIM) 100 MG tablet Take 100 mg by mouth daily as needed (gout). 05/29/20   [provider]  amiodarone (PACERONE) 400 MG tablet Take 1 tablet (400 mg) twice daily, on 1/16 start taking 1 tablet once daily, on 1/23 start taking 0.5 tablet (200 mg) once daily. 12/31/20   Shirley Friar, PA-C  bisoprolol (ZEBETA) 5 MG tablet TAKE 1/2 TABLET(2.5 MG) BY MOUTH DAILY 02/24/21   [name], [name], MD  ELIQUIS 5 MG TABS tablet TAKE 1 TABLET(5 MG) BY MOUTH TWICE DAILY 02/24/21   [name], [name], MD  Evolocumab (REPATHA SURECLICK) 353 MG/ML SOAJ Inject 1 pen into the skin every 14 (fourteen) days. 08/30/20   [name], [name], MD  furosemide (LASIX) 40 MG tablet Take 1 tablet by mouth on Mondays, Wednesdays, & Fridays, take 1/2 tablet by mouth on all other days 01/16/21   Leanor Kail, PA  HYDROcodone-acetaminophen (NORCO/VICODIN) 5-325 MG tablet Take 1 tablet by mouth every 6 (six) hours as needed for moderate pain.    [provider]  isosorbide mononitrate (IMDUR) 30 MG 24 hr tablet Take 1 tablet (30 mg total) by mouth daily. 01/29/21   [name], [name], MD  levothyroxine (SYNTHROID) 50 MCG tablet Take 50 mcg by mouth See admin instructions. Every other night 07/17/19   [provider]  nitroGLYCERIN (NITROSTAT) 0.4 MG SL tablet PLACE 1 TABLET UNDER THE TONGUE EVERY 5 MINUTES AS NEEDED FOR CHEST PAIN OR SHORTNESS OF BREATH 11/21/20   [name], [name], MD  oxyCODONE (OXY IR/ROXICODONE) 5 MG immediate release tablet Take 1-2 tablets (5-10 mg total) by mouth every 6 (six) hours as needed for moderate pain (pain score 4-6). 08/29/19   Pete Pelt, PA-C  potassium chloride SA (KLOR-CON) 20  MEQ tablet Take 0.5 tablets (10 mEq total) by mouth daily. 01/30/21   Fay Records, MD  tamsulosin (FLOMAX) 0.4 MG CAPS capsule Take 0.4 mg by mouth at bedtime. 03/01/17   [provider]  traMADol (ULTRAM) 50 MG tablet Take by mouth as needed.    [provider]    Physical Exam:  Constitutional: Elderly male who appears to be in some discomfort Vitals:   03/22/21 0334 03/22/21 0539 03/22/21 0714 03/22/21 0846  BP: (!) 151/85 (!) 155/88 Marland Kitchen)  148/88 (!) 134/56  Pulse: 92 81 74 84  Resp: 14 18 15 17   Temp: 97.6 F (36.4 C)  97.8 F (36.6 C)   TempSrc: Oral  Oral   SpO2: 98% 98% 99% 97%   Eyes: PERRL, lids and conjunctivae normal ENMT: Mucous membranes are moist. Posterior pharynx clear of any exudate or lesions.  Neck: normal, supple, no masses, no thyromegaly.  Mild JVD. Respiratory: Positive crackles heard in the mid to lower lung fields.  No significant wheezing appreciated at this time.  Patient able to talk in complete sentences Cardiovascular: Irregular, no murmurs / rubs / gallops. No extremity edema. 2+ pedal pulses. No carotid bruits.  Abdomen: Suprapubic abdominal tenderness appreciated prior to Foley catheter being placed.  Which drained out 400 mL of urine Musculoskeletal: no clubbing / cyanosis. No joint deformity upper and lower extremities. Good ROM, no contractures. Normal muscle tone.  Skin: no rashes, lesions, ulcers. No induration Neurologic: CN 2-12 grossly intact. Sensation intact, DTR normal. Strength 5/5 in all 4.  Psychiatric: Normal judgment and insight. Alert and oriented x 3. Normal mood.     Labs on Admission: I have personally reviewed following labs and imaging studies  CBC: Recent Labs  Lab 03/22/21 0004  WBC 11.4*  HGB 15.8  HCT 48.8  MCV 101.7*  PLT 474   Basic Metabolic Panel: Recent Labs  Lab 03/22/21 0004  NA 140  K 4.0  CL 105  CO2 27  GLUCOSE 158*  BUN 19  CREATININE 1.52*  CALCIUM 9.0  MG 2.2    GFR: Estimated Creatinine Clearance: 35.6 mL/min (A) (by C-G formula based on SCr of 1.52 mg/dL (H)). Liver Function Tests: No results for input(s): AST, ALT, ALKPHOS, BILITOT, PROT, ALBUMIN in the last 168 hours. No results for input(s): LIPASE, AMYLASE in the last 168 hours. No results for input(s): AMMONIA in the last 168 hours. Coagulation Profile: Recent Labs  Lab 03/22/21 0004  INR 1.4*   Cardiac Enzymes: No results for input(s): CKTOTAL, CKMB, CKMBINDEX, TROPONINI in the last 168 hours. BNP (last 3 results) No results for input(s): PROBNP in the last 8760 hours. HbA1C: No results for input(s): HGBA1C in the last 72 hours. CBG: No results for input(s): GLUCAP in the last 168 hours. Lipid Profile: No results for input(s): CHOL, HDL, LDLCALC, TRIG, CHOLHDL, LDLDIRECT in the last 72 hours. Thyroid Function Tests: No results for input(s): TSH, T4TOTAL, FREET4, T3FREE, THYROIDAB in the last 72 hours. Anemia Panel: No results for input(s): VITAMINB12, FOLATE, FERRITIN, TIBC, IRON, RETICCTPCT in the last 72 hours. Urine analysis:    Component Value Date/Time   COLORURINE STRAW (A) 02/07/2018 1700   APPEARANCEUR CLEAR 02/07/2018 1700   LABSPEC 1.008 02/07/2018 1700   PHURINE 5.0 02/07/2018 1700   GLUCOSEU NEGATIVE 02/07/2018 1700   HGBUR SMALL (A) 02/07/2018 1700   BILIRUBINUR NEGATIVE 02/07/2018 1700   KETONESUR NEGATIVE 02/07/2018 1700   PROTEINUR NEGATIVE 02/07/2018 1700   UROBILINOGEN 1.0 04/04/2015 2320   NITRITE NEGATIVE 02/07/2018 1700   LEUKOCYTESUR NEGATIVE 02/07/2018 1700   Sepsis Labs: No results found for this or any previous visit (from the past 240 hour(s)).   Radiological Exams on Admission: DG Chest 2 View  Result Date: 03/22/2021 CLINICAL DATA:  Chest pain and shortness of breath for 3 days. EXAM: CHEST - 2 VIEW COMPARISON:  12/31/2020 FINDINGS: Postoperative changes in the mediastinum and left shoulder. Cardiac pacemaker. Mild cardiac enlargement.  Mild vascular congestion. Interstitial changes in the lung bases likely representing  mild interstitial edema. This is developing since previous study. No pleural effusions. Emphysematous changes in the lungs. Calcification of the aorta. Degenerative changes in the spine and right shoulder. IMPRESSION: Cardiac enlargement with mild vascular congestion and interstitial edema. Electronically Signed   By: Lucienne Capers M.D.   On: 03/22/2021 00:46    EKG: Independently reviewed.  Sinus rhythm with premature atrial complexes at 96 bpm with first-degree heart block and QT prolonged 497  Assessment/Plan Systolic congestive heart failure exacerbation: Acute.  Patient presents with complaints of shortness of breath and cough.  On physical exam patient has crackles in both lung fields, but no significant lower extremity edema.  Labs significant for BNP 1273.3.  Chest x-ray significant for cardiac enlargement with mild vascular congestion and interstitial edema.  Last echocardiogram from 12/28/2020 noted EF of 35 to 40%.  Patient was given 40 mg of Lasix IV.  -Admit to a telemetry bed -Heart failure orders set  initiated  -Continuous pulse oximetry with nasal cannula oxygen as needed to keep O2 saturations >92% -Strict I&Os and daily weights -Elevate lower extremities -Lasix 40 mg IV Bid -Reassess diuresis in a.m. and adjust as needed  Elevated troponin CAD s/p CABG [id number]: Patient had initially reported substernal chest pain with onset of symptoms last night.  On admission troponin 46-> 56.  Patient currently reports being chest pain-free. -Follow-up telemetry  Leukocytosis: Acute. WBC 11.4 on admission. Suspect reactive to above. -Continue to monitor  Ventricular tachycardia s/p AICD: Patient had AICD placed after sustained ventricular tachycardia during hospitalization in January of this year. -AICD to be interrogated(noted episode of nonsustained ventricular tachycardia lasting 22 seconds) -Continue  amiodarone  Paroxysmal atrial fibrillation on chronic anticoagulation: Patient currently appears to be rate controlled. -Continue Eliquis, bisoprolol, and amiodarone  PVDcarotid artery stenosis CEA -Continue Eliquis and Repatha  Essential hypertension: Blood pressures currently stable -Continue home medication regimen as tolerated  Possible acute kidney injury superimposed on chronic kidney disease stage IIIa: Chronic.  Patient presents with creatinine elevated up to 1.52.  Baseline creatinine last was 1.25 on 02/07/2021.  Suspect possible acute kidney injury related with hypoperfusion secondary to congestive heart failure. -Continue to monitor kidney function with diuresis  Sympathetic BPH: Patient reported feeling the urge to pee, but only been able to void just a littlle.  Noted to have tenderness to palpation until Foley catheter placed. -Foley catheter placed   -Check urinalysis  Subclinical hypothyroidism: Last TSH 6.705 and free T4 1.1 on 12/2020.  Patient not on levothyroxine. -Check TSH and free T4  Hyperlipidemia -Continue Repatha   DVT prophylaxis: Eliquis Code Status: Full Family Communication: Declined need as he was able to update her his self. Disposition Plan: Hopefully discharge home Consults called: None Admission status: inpatient, require more than 2 midnight stay due to need of IV diarrhea   Norval Morton MD [facility]   If 7PM-7AM, please contact night-coverage   03/22/2021, 9:22 AM

## 2021-03-22 NOTE — ED Provider Notes (Signed)
[facility] EMERGENCY DEPARTMENT Provider Note   CSN: [id number] Arrival date & time: 03/21/21  2348     History Chief Complaint  Patient presents with  . Chest Pain  . Shortness of Breath    Ryan [name] is a 80 y.o. male with a hx of CAD S/p CABG, prior PE/DVT on Eliquis, CHF (last EF 35-40%), A. fib, hypertension, hyperlipidemia, PAD, and ICD who presents to the ED with complaints of dyspnea that began @ 2300 last night.  Patient states that his symptoms began after he returned home from dinner and included shortness of breath, cough productive of phlegm sputum, as well as central chest pressure radiating to the back.  Symptoms were constant and concerned him prompting emergency department visit.  He states that over the past couple of days he has noted that he has had increased work of breathing with activity more so than usual. His sxs felt similar to when he was admitted in January of this year when he was told his heart stopped. He now feels somewhat better, still has a bit of chest pressure. Reports some leg swelling. He denies fever, chills, abdominal pain, nausea, vomiting, diaphoresis, or syncope. Saw cardiologist last week. Has been taking all medications as prescribed.  HPI     Past Medical History:  Diagnosis Date  . Arthritis   . CAD (coronary artery disease)   . Carotid artery occlusion   . Cataract    Bil eyes/worse in left eye  . CHF (congestive heart failure) ([place])   . Chronic back pain   . DVT (deep venous thrombosis) ([place])   . Dysrhythmia   . Enlarged prostate    takes Rapaflo daily  . GERD (gastroesophageal reflux disease)    occasional  . History of colon polyps   . History of gout    has colchicine prn  . History of kidney stones   . Hyperlipidemia    takes Crestor daily  . Hypertension    takes Amlodipine daily  . Hypothyroidism   . Myocardial infarction ([place])   . Peripheral vascular disease ([place])   . Pulmonary emboli ([place])  03/20/2015   elevated d-dimer, intermediate V/Q study, atypical chest pain and SOB. Start on Xarelto 20mg  BID for 3 month  . Rapid atrial fibrillation ([place])   . Renal insufficiency   . Shortness of breath dyspnea   . Urinary frequency   . Urinary urgency     Patient Active Problem List   Diagnosis Date Noted  . Ventricular tachycardia ([place]) 12/28/2020  . CHF (congestive heart failure) ([place]) 12/28/2020  . NSTEMI (non-ST elevated myocardial infarction) ([place])   . Acute respiratory failure with hypoxia ([place])   . S/P total hip arthroplasty 08/16/2019  . Unilateral primary osteoarthritis, right hip 07/18/2019  . PAF (paroxysmal atrial fibrillation) ([place]) 01/27/2019  . Dizziness 01/27/2019  . History of pneumonia 01/27/2019  . CKD (chronic kidney disease) stage 3, GFR 30-59 ml/min (HCC) 01/27/2019  . Chest pain 02/07/2018  . Elevated troponin 03/23/2017  . Sigmoid diverticulitis 03/23/2017  . Acute pyelonephritis 03/23/2017  . AKI (acute kidney injury) ([place]) 03/23/2017  . Wrist pain, acute, right   . Hypothyroidism   . Dyspnea 02/26/2017  . Shoulder blade pain 02/26/2017  . SOB (shortness of breath) 02/26/2017  . Chronic right shoulder pain   . Chronic combined systolic and diastolic CHF (congestive heart failure) ([place])   . Hyperlipidemia LDL goal <70 01/20/2017  . Chronic cholecystitis 07/27/2016  . PAD (  peripheral artery disease) ([place]) 07/09/2015  . S/P CABG x 4 04/05/2015  . Coronary artery disease involving native coronary artery with unstable angina pectoris ([place])   . Atypical chest pain 03/20/2015  . Carotid artery disease ([place]) 10/09/2014  . PVD (peripheral vascular disease) ([place]) 10/09/2014  . HTN (hypertension) 05/30/2014  . Arthritis of left hip 01/09/2014  . Status post THR (total hip replacement) 01/09/2014  . Spinal stenosis, lumbar 01/06/2013    Class: Diagnosis of  . Coronary artery disease 01/05/2013  . Atherosclerosis of native artery of extremity with  intermittent claudication ([place]) 10/18/2012    Past Surgical History:  Procedure Laterality Date  . APPENDECTOMY    . BACK SURGERY     5 times  . big toe surgery    . CARDIAC CATHETERIZATION     2010    dr Acie Fredrickson  . cataract surgery     left eye  . CHOLECYSTECTOMY N/A 07/27/2016   Procedure: LAPAROSCOPIC CHOLECYSTECTOMY;  Surgeon: Mickeal Skinner, MD;  Location: [facility];  Service: General;  Laterality: N/A;  . COLONOSCOPY    . CORONARY ARTERY BYPASS GRAFT N/A 04/05/2015   Procedure: CORONARY ARTERY BYPASS GRAFTING (CABG)X4 LIMA-LAD; SVG-DIAG1-DIAG2; SVG-PD;  Surgeon: [name], MD;  Location: [facility];  Service: Open Heart Surgery;  Laterality: N/A;  . CORONARY/GRAFT ANGIOGRAPHY N/A 04/22/2018   Procedure: CORONARY/GRAFT ANGIOGRAPHY;  Surgeon: Martin[place], Peter M, MD;  Location: [facility];  Service: Cardiovascular;  Laterality: N/A;  . CYSTOSCOPY    . ENDARTERECTOMY Left 04/24/2016   Procedure: ENDARTERECTOMY LEFT CAROTID;  Surgeon: Mal Misty, MD;  Location: [facility];  Service: Vascular;  Laterality: Left;  . EYE SURGERY    . FEMORAL ARTERY - POPLITEAL ARTERY BYPASS GRAFT    . ICD IMPLANT N/A 12/30/2020   Procedure: ICD IMPLANT;  Surgeon: Evans Lance, MD;  Location: [facility];  Service: Cardiovascular;  Laterality: N/A;  . JOINT REPLACEMENT     shoulder  . LEFT HEART CATHETERIZATION WITH CORONARY ANGIOGRAM N/A 04/03/2015   Procedure: LEFT HEART CATHETERIZATION WITH CORONARY ANGIOGRAM;  Surgeon: [place] Sine, MD;  Location: [facility] CATH LAB;  Service: Cardiovascular;  Laterality: N/A;  . LUMBAR LAMINECTOMY  01/06/2013   Procedure: MICRODISCECTOMY LUMBAR LAMINECTOMY;  Surgeon: Marybelle Killings, MD;  Location: [facility];  Service: Orthopedics;  Laterality: N/A;  L3-4 decompression  . LUMBAR LAMINECTOMY/DECOMPRESSION MICRODISCECTOMY  02/12/2012   Procedure: LUMBAR LAMINECTOMY/DECOMPRESSION MICRODISCECTOMY;  Surgeon: Floyce Stakes, MD;  Location: [facility];  Service:  Neurosurgery;  Laterality: N/A;  Lumbar four-five laminectomy  . PATCH ANGIOPLASTY Left 04/24/2016   Procedure: LEFT CAROTID ARTERY PATCH ANGIOPLASTY;  Surgeon: Mal Misty, MD;  Location: [facility];  Service: Vascular;  Laterality: Left;  . RIGHT HEART CATH AND CORONARY/GRAFT ANGIOGRAPHY N/A 01/30/2019   Procedure: RIGHT HEART CATH AND CORONARY/GRAFT ANGIOGRAPHY;  Surgeon: [place] Sine, MD;  Location: [facility];  Service: Cardiovascular;  Laterality: N/A;  . STERIOD INJECTION Right 01/09/2014   Procedure: STEROID INJECTION;  Surgeon: Mcarthur Rossetti, MD;  Location: [facility];  Service: Orthopedics;  Laterality: Right;  . TEE WITHOUT CARDIOVERSION N/A 04/05/2015   Procedure: TRANSESOPHAGEAL ECHOCARDIOGRAM (TEE);  Surgeon: [name], MD;  Location: [facility];  Service: Open Heart Surgery;  Laterality: N/A;  . TOTAL HIP ARTHROPLASTY Left 01/09/2014   DR Ninfa Linden  . TOTAL HIP ARTHROPLASTY Left 01/09/2014   Procedure: LEFT TOTAL HIP ARTHROPLASTY ANTERIOR APPROACH and Steroid Injection Right hip;  Surgeon: Mcarthur Rossetti, MD;  Location: [facility];  Service: Orthopedics;  Laterality: Left;  . TOTAL HIP ARTHROPLASTY Right 08/15/2019  . TOTAL HIP ARTHROPLASTY Right 08/15/2019   Procedure: RIGHT TOTAL HIP ARTHROPLASTY ANTERIOR APPROACH;  Surgeon: Mcarthur Rossetti, MD;  Location: [facility];  Service: Orthopedics;  Laterality: Right;       Family History  Problem Relation Age of Onset  . Heart disease Father   . Heart attack Father   . Heart disease Sister   . Hypertension Sister   . Heart attack Sister   . Hypertension Mother   . Diabetes Son     Social History   Tobacco Use  . Smoking status: Former Smoker    Types: Cigarettes    Quit date: 02/04/1987    Years since quitting: 34.1  . Smokeless tobacco: Former Systems developer    Types: Chew    Quit date: 07/20/2009  . Tobacco comment: quit 35+yrs ago  Vaping Use  . Vaping Use: Never used  Substance Use Topics  . Alcohol  use: No    Alcohol/week: 0.0 standard drinks  . Drug use: No    Home Medications Prior to Admission medications   Medication Sig Start Date End Date Taking? Authorizing Provider  allopurinol (ZYLOPRIM) 100 MG tablet Take 100 mg by mouth daily as needed (gout). 05/29/20   [provider]  amiodarone (PACERONE) 400 MG tablet Take 1 tablet (400 mg) twice daily, on 1/16 start taking 1 tablet once daily, on 1/23 start taking 0.5 tablet (200 mg) once daily. 12/31/20   Shirley Friar, PA-C  bisoprolol (ZEBETA) 5 MG tablet TAKE 1/2 TABLET(2.5 MG) BY MOUTH DAILY 02/24/21   [name], [name], MD  ELIQUIS 5 MG TABS tablet TAKE 1 TABLET(5 MG) BY MOUTH TWICE DAILY 02/24/21   [name], [name], MD  Evolocumab (REPATHA SURECLICK) 132 MG/ML SOAJ Inject 1 pen into the skin every 14 (fourteen) days. 08/30/20   [name], [name], MD  furosemide (LASIX) 40 MG tablet Take 1 tablet by mouth on Mondays, Wednesdays, & Fridays, take 1/2 tablet by mouth on all other days 01/16/21   Leanor Kail, PA  HYDROcodone-acetaminophen (NORCO/VICODIN) 5-325 MG tablet Take 1 tablet by mouth every 6 (six) hours as needed for moderate pain.    [provider]  isosorbide mononitrate (IMDUR) 30 MG 24 hr tablet Take 1 tablet (30 mg total) by mouth daily. 01/29/21   [name], [name], MD  levothyroxine (SYNTHROID) 50 MCG tablet Take 50 mcg by mouth See admin instructions. Every other night 07/17/19   [provider]  nitroGLYCERIN (NITROSTAT) 0.4 MG SL tablet PLACE 1 TABLET UNDER THE TONGUE EVERY 5 MINUTES AS NEEDED FOR CHEST PAIN OR SHORTNESS OF BREATH 11/21/20   [name], [name], MD  oxyCODONE (OXY IR/ROXICODONE) 5 MG immediate release tablet Take 1-2 tablets (5-10 mg total) by mouth every 6 (six) hours as needed for moderate pain (pain score 4-6). 08/29/19   Pete Pelt, PA-C  potassium chloride SA (KLOR-CON) 20 MEQ tablet Take 0.5 tablets (10 mEq total) by mouth daily. 01/30/21   Fay Records, MD   tamsulosin (FLOMAX) 0.4 MG CAPS capsule Take 0.4 mg by mouth at bedtime. 03/01/17   [provider]  traMADol (ULTRAM) 50 MG tablet Take by mouth as needed.    [provider]    Allergies    Zetia [ezetimibe], Codeine, and Unithroid [levothyroxine sodium]  Review of Systems   Review of Systems  Constitutional: Negative  for chills, diaphoresis and fever.  Respiratory: Positive for cough and shortness of breath.   Cardiovascular: Positive for chest pain and leg swelling.  Gastrointestinal: Negative for abdominal pain, diarrhea, nausea and vomiting.  Neurological: Negative for syncope.  All other systems reviewed and are negative.   Physical Exam Updated Vital Signs BP (!) 148/88 (BP Location: Right Arm)   Pulse 74   Temp 97.8 F (36.6 C) (Oral)   Resp 15   SpO2 99%   Physical Exam Vitals and nursing note reviewed.  Constitutional:      General: He is not in acute distress.    Appearance: He is well-developed. He is not toxic-appearing.  HENT:     Head: Normocephalic and atraumatic.  Eyes:     General:        Right eye: No discharge.        Left eye: No discharge.     Conjunctiva/sclera: Conjunctivae normal.  Cardiovascular:     Rate and Rhythm: Normal rate and regular rhythm.     Pulses:          Posterior tibial pulses are 2+ on the right side and 2+ on the left side.  Pulmonary:     Effort: No respiratory distress.     Breath sounds: Rales (faint bibasilar) present. No wheezing or rhonchi.  Abdominal:     General: There is no distension.     Palpations: Abdomen is soft.     Tenderness: There is no abdominal tenderness.  Musculoskeletal:     Cervical back: Neck supple.     Comments: Trace edema to bilateral lower legs, symmetric, no calf tenderness.   Skin:    General: Skin is warm and dry.     Findings: No rash.  Neurological:     Mental Status: He is alert.     Comments: Clear speech.   Psychiatric:        Behavior: Behavior normal.      ED Results / Procedures / Treatments   Labs (all labs ordered are listed, but only abnormal results are displayed) Labs Reviewed  BASIC METABOLIC PANEL - Abnormal; Notable for the following components:      Result Value   Glucose, Bld 158 (*)    Creatinine, Ser 1.52 (*)    GFR, Estimated 46 (*)    All other components within normal limits  CBC - Abnormal; Notable for the following components:   WBC 11.4 (*)    MCV 101.7 (*)    All other components within normal limits  PROTIME-INR - Abnormal; Notable for the following components:   Prothrombin Time 16.3 (*)    INR 1.4 (*)    All other components within normal limits  BRAIN NATRIURETIC PEPTIDE - Abnormal; Notable for the following components:   B Natriuretic Peptide 1,273.3 (*)    All other components within normal limits  TROPONIN I (HIGH SENSITIVITY) - Abnormal; Notable for the following components:   Troponin I (High Sensitivity) 46 (*)    All other components within normal limits  TROPONIN I (HIGH SENSITIVITY) - Abnormal; Notable for the following components:   Troponin I (High Sensitivity) 56 (*)    All other components within normal limits  RESP PANEL BY RT-PCR (FLU A&B, COVID) ARPGX2  MAGNESIUM    EKG EKG Interpretation  Date/Time:  Saturday March 22 2021 00:01:20 EDT Ventricular Rate:  96 PR Interval:  200 QRS Duration: 88 QT Interval:  394 QTC Calculation: 497 R Axis:   87 Text  Interpretation: Sinus rhythm with Premature atrial complexes Low voltage QRS Nonspecific ST abnormality Prolonged QT Abnormal ECG No significant change since last tracing Confirmed by Nanda Quinton [phone]) on 03/22/2021 8:45:07 AM   Radiology DG Chest 2 View  Result Date: 03/22/2021 CLINICAL DATA:  Chest pain and shortness of breath for 3 days. EXAM: CHEST - 2 VIEW COMPARISON:  12/31/2020 FINDINGS: Postoperative changes in the mediastinum and left shoulder. Cardiac pacemaker. Mild cardiac enlargement. Mild vascular congestion.  Interstitial changes in the lung bases likely representing mild interstitial edema. This is developing since previous study. No pleural effusions. Emphysematous changes in the lungs. Calcification of the aorta. Degenerative changes in the spine and right shoulder. IMPRESSION: Cardiac enlargement with mild vascular congestion and interstitial edema. Electronically Signed   By: Lucienne Capers M.D.   On: 03/22/2021 00:46    Procedures Procedures   Medications Ordered in ED Medications - No data to display  ED Course  I have reviewed the triage vital signs and the nursing notes.  Pertinent labs & imaging results that were available during my care of the patient were reviewed by me and considered in my medical decision making (see chart for details).    MDM Rules/Calculators/A&P                         Patient presents to the ED with complaints of dyspnea/cough/chest discomfort. Nontoxic, vitals on my assessment WNL with the exception of elevated BP- doubt HTN emergency. Was initially on oxygen in triage which helped his sxs, has weaned off of this. Bibasilar rales, trace edema to lower legs.   DDx: primarily considering Acute CHF, ACS, pulmonary embolism, pneumonia,  Additional history obtained:  Additional history obtained from chart review & nursing note review.   EKG: No significant change since last tracing  Lab Tests:  I reviewed and interpreted labs, which included:  CBC: Mild leukocytosis- felt to be nonspecific.  BMP: Renal function similar to prior.  Troponin: Elevated some, less so than prior.  BNP: Elevated Magnesium: WNL  Imaging Studies ordered:  CXR ordered by triage, I independently reviewed, formal radiology impression shows: Cardiac enlargement with mild vascular congestion and interstitial edema.   ED Course:  Findings appear consistent with acute CHF exacerbation, has improved some while waiting in the waiting room for extended period of time at rest on  supplemental O2, complex medical hx, troponins are elevated and did upstrend some, will give IV diuresis & aspirin, and discuss with hospital service for admission.Findings and plan of care discussed with supervising physician Dr. Laverta [place] who is in agreement.   09:22: CONSULT: Discussed with hospitalist Dr. Tamala Julian- accepts admission.   Portions of this note were generated with Lobbyist. Dictation errors may occur despite best attempts at proofreading.  Final Clinical Impression(s) / ED Diagnoses Final diagnoses:  Acute congestive heart failure, unspecified heart failure type [facility])    Rx / DC Orders ED Discharge Orders    None       Amaryllis Dyke, PA-C 03/22/21 [id number]    Margette Fast, MD 03/23/21 [phone]

## 2021-03-22 NOTE — Plan of Care (Signed)
Initiate Care Plan Problem: Education: Goal: Knowledge of General Education information will improve Description: Including pain rating scale, medication(s)/side effects and non-pharmacologic comfort measures Outcome: Progressing   Problem: Health Behavior/Discharge Planning: Goal: Ability to manage health-related needs will improve Outcome: Progressing   Problem: Clinical Measurements: Goal: Ability to maintain clinical measurements within normal limits will improve Outcome: Progressing Goal: Will remain free from infection Outcome: Progressing Goal: Diagnostic test results will improve Outcome: Progressing Goal: Respiratory complications will improve Outcome: Progressing Goal: Cardiovascular complication will be avoided Outcome: Progressing   Problem: Activity: Goal: Risk for activity intolerance will decrease Outcome: Progressing   Problem: Nutrition: Goal: Adequate nutrition will be maintained Outcome: Progressing   Problem: Coping: Goal: Level of anxiety will decrease Outcome: Progressing   Problem: Elimination: Goal: Will not experience complications related to bowel motility Outcome: Progressing Goal: Will not experience complications related to urinary retention Outcome: Progressing   Problem: Pain Managment: Goal: General experience of comfort will improve Outcome: Progressing   Problem: Safety: Goal: Ability to remain free from injury will improve Outcome: Progressing   Problem: Skin Integrity: Goal: Risk for impaired skin integrity will decrease Outcome: Progressing   Problem: Education: Goal: Ability to demonstrate management of disease process will improve Outcome: Progressing Goal: Ability to verbalize understanding of medication therapies will improve Outcome: Progressing Goal: Individualized Educational Video(s) Outcome: Progressing   Problem: Activity: Goal: Capacity to carry out activities will improve Outcome: Progressing   Problem:  Cardiac: Goal: Ability to achieve and maintain adequate cardiopulmonary perfusion will improve Outcome: Progressing   Problem: Education: Goal: Knowledge of disease or condition will improve Outcome: Progressing Goal: Understanding of medication regimen will improve Outcome: Progressing Goal: Individualized Educational Video(s) Outcome: Progressing   Problem: Activity: Goal: Ability to tolerate increased activity will improve Outcome: Progressing   Problem: Cardiac: Goal: Ability to achieve and maintain adequate cardiopulmonary perfusion will improve Outcome: Progressing   Problem: Health Behavior/Discharge Planning: Goal: Ability to safely manage health-related needs after discharge will improve Outcome: Progressing

## 2021-03-22 NOTE — Plan of Care (Signed)
  Problem: Education: Goal: Knowledge of General Education information will improve Description: Including pain rating scale, medication(s)/side effects and non-pharmacologic comfort measures Outcome: Progressing   Problem: Health Behavior/Discharge Planning: Goal: Ability to manage health-related needs will improve Outcome: Progressing   Problem: Clinical Measurements: Goal: Ability to maintain clinical measurements within normal limits will improve Outcome: Progressing Goal: Will remain free from infection Outcome: Progressing Goal: Diagnostic test results will improve Outcome: Progressing Goal: Respiratory complications will improve Outcome: Progressing Goal: Cardiovascular complication will be avoided Outcome: Progressing   Problem: Activity: Goal: Risk for activity intolerance will decrease Outcome: Progressing   Problem: Nutrition: Goal: Adequate nutrition will be maintained Outcome: Progressing   Problem: Coping: Goal: Level of anxiety will decrease Outcome: Progressing   Problem: Elimination: Goal: Will not experience complications related to bowel motility Outcome: Progressing Goal: Will not experience complications related to urinary retention Outcome: Progressing   Problem: Pain Managment: Goal: General experience of comfort will improve Outcome: Progressing   Problem: Safety: Goal: Ability to remain free from injury will improve Outcome: Progressing   Problem: Skin Integrity: Goal: Risk for impaired skin integrity will decrease Outcome: Progressing   Problem: Education: Goal: Ability to demonstrate management of disease process will improve Outcome: Progressing Goal: Ability to verbalize understanding of medication therapies will improve Outcome: Progressing Goal: Individualized Educational Video(s) Outcome: Progressing   Problem: Activity: Goal: Capacity to carry out activities will improve Outcome: Progressing   Problem: Cardiac: Goal:  Ability to achieve and maintain adequate cardiopulmonary perfusion will improve Outcome: Progressing   Problem: Education: Goal: Knowledge of disease or condition will improve Outcome: Progressing Goal: Understanding of medication regimen will improve Outcome: Progressing Goal: Individualized Educational Video(s) Outcome: Progressing   Problem: Activity: Goal: Ability to tolerate increased activity will improve Outcome: Progressing   Problem: Cardiac: Goal: Ability to achieve and maintain adequate cardiopulmonary perfusion will improve Outcome: Progressing   Problem: Health Behavior/Discharge Planning: Goal: Ability to safely manage health-related needs after discharge will improve Outcome: Progressing   

## 2021-03-22 NOTE — ED Notes (Signed)
Heart healthy lunch tray ordered 

## 2021-03-23 DIAGNOSIS — I509 Heart failure, unspecified: Secondary | ICD-10-CM

## 2021-03-23 DIAGNOSIS — Z95810 Presence of automatic (implantable) cardiac defibrillator: Secondary | ICD-10-CM | POA: Diagnosis not present

## 2021-03-23 DIAGNOSIS — I480 Paroxysmal atrial fibrillation: Secondary | ICD-10-CM | POA: Diagnosis not present

## 2021-03-23 DIAGNOSIS — I10 Essential (primary) hypertension: Secondary | ICD-10-CM

## 2021-03-23 DIAGNOSIS — I739 Peripheral vascular disease, unspecified: Secondary | ICD-10-CM

## 2021-03-23 DIAGNOSIS — E785 Hyperlipidemia, unspecified: Secondary | ICD-10-CM

## 2021-03-23 DIAGNOSIS — N179 Acute kidney failure, unspecified: Secondary | ICD-10-CM

## 2021-03-23 DIAGNOSIS — N189 Chronic kidney disease, unspecified: Secondary | ICD-10-CM

## 2021-03-23 DIAGNOSIS — I5021 Acute systolic (congestive) heart failure: Secondary | ICD-10-CM | POA: Diagnosis not present

## 2021-03-23 DIAGNOSIS — E038 Other specified hypothyroidism: Secondary | ICD-10-CM | POA: Diagnosis not present

## 2021-03-23 DIAGNOSIS — Z951 Presence of aortocoronary bypass graft: Secondary | ICD-10-CM | POA: Diagnosis not present

## 2021-03-23 DIAGNOSIS — I472 Ventricular tachycardia: Secondary | ICD-10-CM | POA: Diagnosis not present

## 2021-03-23 DIAGNOSIS — I5023 Acute on chronic systolic (congestive) heart failure: Secondary | ICD-10-CM | POA: Diagnosis not present

## 2021-03-23 LAB — BASIC METABOLIC PANEL
Anion gap: 6 (ref 5–15)
BUN: 20 mg/dL (ref 8–23)
CO2: 27 mmol/L (ref 22–32)
Calcium: 8.5 mg/dL — ABNORMAL LOW (ref 8.9–10.3)
Chloride: 104 mmol/L (ref 98–111)
Creatinine, Ser: 1.43 mg/dL — ABNORMAL HIGH (ref 0.61–1.24)
GFR, Estimated: 50 mL/min — ABNORMAL LOW (ref >60)
Glucose, Bld: 96 mg/dL (ref 70–99)
Potassium: 3.6 mmol/L (ref 3.5–5.1)
Sodium: 137 mmol/L (ref 135–145)

## 2021-03-23 MED ORDER — FUROSEMIDE 40 MG PO TABS: 40.0000 mg | ORAL_TABLET | Freq: Every day | ORAL | 1 refills | Status: DC

## 2021-03-23 NOTE — Discharge Instructions (Signed)
Heart Failure, Diagnosis  Heart failure means that your heart is not able to pump blood in the right way. This makes it hard for your body to work well. Heart failure is usually a long-term (chronic) condition. You must take good care of yourself and follow your treatment plan from your doctor. What are the causes?  High blood pressure.  Buildup of cholesterol and fat in the arteries.  Heart attack. This injures the heart muscle.  Heart valves that do not open and close properly.  Damage of the heart muscle. This is also called cardiomyopathy.  Infection of the heart muscle. This is also called myocarditis.  Lung disease. What increases the risk?  Getting older. The risk of heart failure goes up as a person ages.  Being overweight.  Being male.  Use tobacco or nicotine products.  Abusing alcohol or drugs.  Having taken medicines that can damage the heart.  Having any of these conditions: ? Diabetes. ? Abnormal heart rhythms. ? Thyroid problems. ? Low blood counts (anemia).  Having a family history of heart failure. What are the signs or symptoms?  Shortness of breath.  Coughing.  Swelling of the feet, ankles, legs, or belly.  Losing or gaining weight for no reason.  Trouble breathing.  Waking from sleep because of the need to sit up and get more air.  Fast heartbeat.  Being very tired.  Feeling dizzy, or feeling like you may pass out (faint).  Having no desire to eat.  Feeling like you may vomit (nauseous).  Peeing (urinating) more at night.  Feeling confused. How is this treated? This condition may be treated with:  Medicines. These can be given to treat blood pressure and to make the heart muscles stronger.  Changes in your daily life. These may include: ? Eating a healthy diet. ? Staying at a healthy body weight. ? Quitting tobacco, alcohol, and drug use. ? Doing exercises. ? Participating in a cardiac rehabilitation program. This program  helps you improve your health through exercise, education, and counseling.  Surgery. Surgery can be done to open blocked valves, or to put devices in the heart, such as pacemakers.  A donor heart (heart transplant). You will receive a healthy heart from a donor. Follow these instructions at home:  Treat other conditions as told by your doctor. These may include high blood pressure, diabetes, thyroid disease, or abnormal heart rhythms.  Learn as much as you can about heart failure.  Get support as you need it.  Keep all follow-up visits. Summary  Heart failure means that your heart is not able to pump blood in the right way.  This condition is often caused by high blood pressure, heart attack, or damage of the heart muscle.  Symptoms of this condition include shortness of breath and swelling of the feet, ankles, legs, or belly. You may also feel very tired or feel like you may vomit.  You may be treated with medicines, surgery, or changes in your daily life.  Treat other health conditions as told by your doctor. This information is not intended to replace advice given to you by your health care provider. Make sure you discuss any questions you have with your health care provider. Document Revised: 06/29/2020 Document Reviewed: 06/29/2020 Elsevier Patient Education  2021 Elsevier Inc.  

## 2021-03-23 NOTE — Discharge Summary (Signed)
Physician Discharge Summary  Ryan [name] GBT:[id number] DOB: [birth year] DOA: 03/21/2021  PCP: Ginger Organ., MD  Admit date: 03/21/2021 Discharge date: 03/23/2021  Time spent: 45 minutes  Recommendations for Outpatient Follow-up:  Patient will be discharged to home.  Patient will need to follow up with primary care provider within one week of discharge, repeat BMP and discuss thyroid function.  Follow up cardiology. Patient should continue medications as prescribed.  Patient should follow a heart healthy diet.   Discharge Diagnoses:  Acute Systolic congestive heart failure exacerbation Elevated troponin/CAD  Leukocytosis Ventricular tachycardia s/p AICD Paroxysmal atrial fibrillation on chronic anticoagulation PVD/carotid artery stenosis CEA Essential hypertension Chronic kidney disease stage IIIa BPH  Subclinical hypothyroidism Hyperlipidemia  Discharge Condition: stable  Diet recommendation: heart healthy  Filed Weights   03/22/21 1452 03/23/21 0402  Weight: 69.9 kg 69.3 kg    History of present illness:  On 03/24/2021 by Dr. Fuller Plan Ryan [name] is a 80 y.o. male with medical history significant of hypertension, hyperlipidemia, CAD s/p CABG, systolic CHF, PVD, A. fib, VT s/p AICD, DVT/PE on Eliquis, hypothyroidism, and GERD notes with complaints of shortness of breath and cough. He had just went to bed last night around 10:30 PM and when he turned over to his side began coughing and acutely got short of breath.  Noted associated symptoms of feeling hot, heart racing, nausea feeling as though he wanted to vomit, and had some substernal chest pain that did not radiate.  He had tried opening the door to let the cool air come in to see if it would help, but it provided no relief.  Patient reports that his weight just got back to around 160 pounds from his prior hospitalization.  He was hospitalized in January of this year after being found to be in monomorphic  ventricular tachycardia requiring cardioversion and subsequent AICD placement.  Symptoms that he felt last night felt very similar to when he came into the hospital January and therefore called EMS.  Denies that he ever received any shocks from his AICD.  He currently has the urge to urinate, but is incompletely voiding.  He had just had a follow-up appointment with Dr. Katharina Caper of cardiology on 03/17/2021.  He forgot to mention that he had been hearing crackling in his throat specifically when he would lay down at night over the last month.  Related symptoms to what sounds like esophageal stenosis and the possibility of it needing to be stretched.  Hospital Course:  Acute Systolic congestive heart failure exacerbation -Patient presented with shortness of breath and cough -BNP 1273.3 -Chest x-ray showed mild vascular congestion and interstitial edema -Echocardiogram 182 922 showed an EF of 35 to 40% -Patient is on alternating doses of Lasix, 40/20 mg every other day -Patient was placed on IV Lasix 40 mg twice daily -Urine output over the past 24 hours 2100 cc; weight down 2 pounds since admission -Patient states his breathing has improved and is feeling much better -Cardiology consulted and appreciated recommending 40 mg Lasix orally daily -Discussed diet modifications as well as fluid intake with patient -Patient can follow-up with cardiology, Dr. Acie Fredrickson  Elevated troponin/CAD  -s/p CABG [phone] -Patient initially reported substernal chest pain but currently states he is no longer having chest pain -High-sensitivity troponin 46 followed by 2 -Suspect secondary to demand ischemia from CHF exacerbation  Leukocytosis -Suspect reactive -Currently afebrile -Chest x-ray and UA are unremarkable for infection   Ventricular tachycardia s/p AICD -  Patient had AICD placed after sustained ventricular tachycardia during hospitalization in January of this year. -Patient noted to have an episode of  nonsustained VT lasting 22 seconds; no shocks -Continue amiodarone -Cardiology consulted and appreciated, patient can follow with Dr. Lovena Le  Paroxysmal atrial fibrillation on chronic anticoagulation -Patient currently appears to be rate controlled. -Continue Eliquis, bisoprolol, and amiodarone  PVD/carotid artery stenosis CEA -Continue Eliquis and Repatha  Essential hypertension -Blood pressures have been on the softer side according to patient -Continue home medications and follow up with cardiology  Chronic kidney disease stage IIIa -Upon review of chart, patient's creatinine has ranged between 1.2-1.5 -Creatinine currently stable -Patient should have creatinine monitored periodically given diuresis  BPH  -Patient has had urgency likely related to diuresis -UA obtained, unremarkable for infection -Currently on flomax  Subclinical hypothyroidism -TSH 7.434, free T4 1.04 -Given acute CHF exacerbation, would not start treatment at this time -Patient will need to follow-up with his PCP for retesting   Hyperlipidemia -Continue Repatha  Procedures: none  Consultations: Cardiology  Discharge Exam: Vitals:   03/23/21 0817 03/23/21 0857  BP: (!) 108/49 95/64  Pulse: 69 76  Resp: 16 15  Temp: 98.3 F (36.8 C)   SpO2: 92% 91%     General: Well developed, well nourished, elderly, NAD  HEENT: NCAT, mucous membranes moist.  Cardiovascular: S1 S2 auscultated, irregular, soft SEM  Respiratory: Clear to auscultation bilaterally, no wheezing, rhonchi or rales  Abdomen: Soft, nontender, nondistended, + bowel sounds  Extremities: warm dry without cyanosis clubbing or edema  Neuro: AAOx3, nonfocal  Psych: Normal affect and demeanor with intact judgement and insight  Discharge Instructions Discharge Instructions    (HEART FAILURE PATIENTS) Call MD:  Anytime you have any of the following symptoms: 1) 3 pound weight gain in 24 hours or 5 pounds in 1 week 2)  shortness of breath, with or without a dry hacking cough 3) swelling in the hands, feet or stomach 4) if you have to sleep on extra pillows at night in order to breathe.   Complete by: As directed    Diet - low sodium heart healthy   Complete by: As directed    Discharge instructions   Complete by: As directed    Patient will be discharged to home.  Patient will need to follow up with primary care provider within one week of discharge, repeat BMP and discuss thyroid function.  Follow up cardiology. Patient should continue medications as prescribed.  Patient should follow a heart healthy diet.   Increase activity slowly   Complete by: As directed      Allergies as of 03/23/2021      Reactions   Zetia [ezetimibe] Other (See Comments)   Myalgias    Codeine Nausea And Vomiting       Unithroid [levothyroxine Sodium] Other (See Comments)   Caused blurry vision per pt      Medication List    TAKE these medications   allopurinol 100 MG tablet Commonly known as: ZYLOPRIM Take 100 mg by mouth daily as needed (gout).   amiodarone 400 MG tablet Commonly known as: PACERONE Take 1 tablet (400 mg) twice daily, on 1/16 start taking 1 tablet once daily, on 1/23 start taking 0.5 tablet (200 mg) once daily. What changed:   how much to take  how to take this  when to take this  additional instructions   bisoprolol 5 MG tablet Commonly known as: ZEBETA TAKE 1/2 TABLET(2.5 MG) BY MOUTH DAILY What  changed: See the new instructions.   Eliquis 5 MG Tabs tablet Generic drug: apixaban TAKE 1 TABLET(5 MG) BY MOUTH TWICE DAILY What changed: See the new instructions.   furosemide 40 MG tablet Commonly known as: Lasix Take 1 tablet (40 mg total) by mouth daily. What changed:   how much to take  how to take this  when to take this  additional instructions   HYDROcodone-acetaminophen 5-325 MG tablet Commonly known as: NORCO/VICODIN Take 1 tablet by mouth every 6 (six) hours as needed  for moderate pain.   isosorbide mononitrate 30 MG 24 hr tablet Commonly known as: IMDUR Take 1 tablet (30 mg total) by mouth daily. What changed: when to take this   nitroGLYCERIN 0.4 MG SL tablet Commonly known as: NITROSTAT PLACE 1 TABLET UNDER THE TONGUE EVERY 5 MINUTES AS NEEDED FOR CHEST PAIN OR SHORTNESS OF BREATH What changed: See the new instructions.   oxyCODONE 5 MG immediate release tablet Commonly known as: Oxy IR/ROXICODONE Take 1-2 tablets (5-10 mg total) by mouth every 6 (six) hours as needed for moderate pain (pain score 4-6).   potassium chloride SA 20 MEQ tablet Commonly known as: KLOR-CON Take 0.5 tablets (10 mEq total) by mouth daily. What changed:   how much to take  when to take this   Repatha SureClick 683 MG/ML Soaj Generic drug: Evolocumab Inject 1 pen into the skin every 14 (fourteen) days.   tamsulosin 0.4 MG Caps capsule Commonly known as: FLOMAX Take 0.4 mg by mouth at bedtime.      Allergies  Allergen Reactions  . Zetia [Ezetimibe] Other (See Comments)    Myalgias   . Codeine Nausea And Vomiting       . Unithroid [Levothyroxine Sodium] Other (See Comments)    Caused blurry vision per pt    Follow-up Information    Ginger Organ., MD. Schedule an appointment as soon as possible for a visit in 1 week(s).   Specialty: Internal Medicine Why: Hospital follow-up Contact information: [place] [address] [phone]        [name], [name], MD .   Specialty: Cardiology Contact information: [place] [address] [phone]                The results of significant diagnostics from this hospitalization (including imaging, microbiology, ancillary and laboratory) are listed below for reference.    Significant Diagnostic Studies: DG Chest 2 View  Result Date: 03/22/2021 CLINICAL DATA:  Chest pain and shortness of breath for 3 days. EXAM: CHEST - 2 VIEW COMPARISON:   12/31/2020 FINDINGS: Postoperative changes in the mediastinum and left shoulder. Cardiac pacemaker. Mild cardiac enlargement. Mild vascular congestion. Interstitial changes in the lung bases likely representing mild interstitial edema. This is developing since previous study. No pleural effusions. Emphysematous changes in the lungs. Calcification of the aorta. Degenerative changes in the spine and right shoulder. IMPRESSION: Cardiac enlargement with mild vascular congestion and interstitial edema. Electronically Signed   By: Lucienne Capers M.D.   On: 03/22/2021 00:46    Microbiology: Recent Results (from the past 240 hour(s))  Resp Panel by RT-PCR (Flu A&B, Covid) Nasopharyngeal Swab     Status: None   Collection Time: 03/22/21 10:04 AM   Specimen: Nasopharyngeal Swab; Nasopharyngeal(NP) swabs in vial transport medium  Result Value Ref Range Status   SARS Coronavirus 2 by RT PCR NEGATIVE NEGATIVE Final    Comment: (NOTE) SARS-CoV-2 target nucleic acids are  NOT DETECTED.  The SARS-CoV-2 RNA is generally detectable in upper respiratory specimens during the acute phase of infection. The lowest concentration of SARS-CoV-2 viral copies this assay can detect is 138 copies/mL. A negative result does not preclude SARS-Cov-2 infection and should not be used as the sole basis for treatment or other patient management decisions. A negative result may occur with  improper specimen collection/handling, submission of specimen other than nasopharyngeal swab, presence of viral mutation(s) within the areas targeted by this assay, and inadequate number of viral copies(<138 copies/mL). A negative result must be combined with clinical observations, patient history, and epidemiological information. The expected result is Negative.  Fact Sheet for Patients:  EntrepreneurPulse.com.au  Fact Sheet for Healthcare Providers:  IncredibleEmployment.be  This test is no t yet  approved or cleared by the [place] FDA and  has been authorized for detection and/or diagnosis of SARS-CoV-2 by FDA under an Emergency Use Authorization (EUA). This EUA will remain  in effect (meaning this test can be used) for the duration of the COVID-19 declaration under Section 564(b)(1) of the Act, 21 U.S.C.section 360bbb-3(b)(1), unless the authorization is terminated  or revoked sooner.       Influenza A by PCR NEGATIVE NEGATIVE Final   Influenza B by PCR NEGATIVE NEGATIVE Final    Comment: (NOTE) The Xpert Xpress SARS-CoV-2/FLU/RSV plus assay is intended as an aid in the diagnosis of influenza from Nasopharyngeal swab specimens and should not be used as a sole basis for treatment. Nasal washings and aspirates are unacceptable for Xpert Xpress SARS-CoV-2/FLU/RSV testing.  Fact Sheet for Patients: EntrepreneurPulse.com.au  Fact Sheet for Healthcare Providers: IncredibleEmployment.be  This test is not yet approved or cleared by the [place] FDA and has been authorized for detection and/or diagnosis of SARS-CoV-2 by FDA under an Emergency Use Authorization (EUA). This EUA will remain in effect (meaning this test can be used) for the duration of the COVID-19 declaration under Section 564(b)(1) of the Act, 21 U.S.C. section 360bbb-3(b)(1), unless the authorization is terminated or revoked.  Performed at [facility], [place] [address] [place], [place] [address]   MRSA PCR Screening     Status: None   Collection Time: 03/22/21  2:48 PM   Specimen: Nasal Mucosa; Nasopharyngeal  Result Value Ref Range Status   MRSA by PCR NEGATIVE NEGATIVE Final    Comment:        The GeneXpert MRSA Assay (FDA approved for NASAL specimens only), is one component of a comprehensive MRSA colonization surveillance program. It is not intended to diagnose MRSA infection nor to guide or monitor treatment for MRSA infections. Performed at  [facility], [place] [address], [place], [place] [address]      Labs: Basic Metabolic Panel: Recent Labs  Lab 03/22/21 0004 03/23/21 0037  NA 140 137  K 4.0 3.6  CL 105 104  CO2 27 27  GLUCOSE 158* 96  BUN 19 20  CREATININE 1.52* 1.43*  CALCIUM 9.0 8.5*  MG 2.2  --    Liver Function Tests: No results for input(s): AST, ALT, ALKPHOS, BILITOT, PROT, ALBUMIN in the last 168 hours. No results for input(s): LIPASE, AMYLASE in the last 168 hours. No results for input(s): AMMONIA in the last 168 hours. CBC: Recent Labs  Lab 03/22/21 0004  WBC 11.4*  HGB 15.8  HCT 48.8  MCV 101.7*  PLT 307   Cardiac Enzymes: No results for input(s): CKTOTAL, CKMB, CKMBINDEX, TROPONINI in the last 168 hours. BNP:  BNP (last 3 results) Recent Labs    12/28/20 0816 03/22/21 0004  BNP 705.8* 1,273.3*    ProBNP (last 3 results) No results for input(s): PROBNP in the last 8760 hours.  CBG: No results for input(s): GLUCAP in the last 168 hours.     Signed:  Cristal Ford  [facility] 03/23/2021, 9:50 AM

## 2021-03-23 NOTE — Consult Note (Addendum)
Cardiology Consultation:   Patient ID: Ryan [name] MRN: [id number]; DOB: August 16, 1941  Admit date: 03/21/2021 Date of Consult: 03/23/2021  PCP:  Ginger Organ., MD   [facility]  Cardiologist:  Mertie Moores, MD  Advanced Practice Provider:  No care team member to display Electrophysiologist:  None        Patient Profile:   Ryan [name] is a 80 y.o. male with a hx of CAD post CABG with systolic heart failure who is being seen today for the evaluation of heart failure exacerbation at the request of Dr. Barbaraann Rondo.  History of Present Illness:   [name] this 80 year old male with CAD post CABG with systolic heart failure PVD atrial fibrillation ventricular tachycardia status post ICD, DVT/PE on Eliquis admitted with shortness of breath as well as cough.  In January 2022 he was also hospitalized secondary to monomorphic VT requiring cardioversion and subsequent ICD placement.  He felt symptoms very similar to this and therefore called EMS.  No shocks from the ICD.  Creatinine was 1.59 BNP 1273 troponin 50 flat.  He was given IV Lasix 40 mg and felt much better.  X-ray personally reviewed showed cardiac enlargement as well as vascular congestion  On telemetry-no evidence of ventricular tachycardia Atrial fibrillation at baseline.  Currently he is feeling well enough to go home he states.  No more shortness of breath.  Feels better no chest pain.  No syncope no dizziness.  Blood pressure usually runs in the upper 90s low 100s at home.  He did recently have his Lasix adjusted to 40/20 because of urinary issues.  I have encouraged him to take 40 once a day.  Foley catheter was placed earlier on during this admission.    Past Medical History:  Diagnosis Date  . Arthritis   . CAD (coronary artery disease)   . Carotid artery occlusion   . Cataract    Bil eyes/worse in left eye  . CHF (congestive heart failure) ([place])   . Chronic back pain   . DVT  (deep venous thrombosis) ([place])   . Dysrhythmia   . Enlarged prostate    takes Rapaflo daily  . GERD (gastroesophageal reflux disease)    occasional  . History of colon polyps   . History of gout    has colchicine prn  . History of kidney stones   . Hyperlipidemia    takes Crestor daily  . Hypertension    takes Amlodipine daily  . Hypothyroidism   . Myocardial infarction ([place])   . Peripheral vascular disease ([place])   . Pulmonary emboli ([place]) 03/20/2015   elevated d-dimer, intermediate V/Q study, atypical chest pain and SOB. Start on Xarelto 20mg  BID for 3 month  . Rapid atrial fibrillation ([place])   . Renal insufficiency   . Shortness of breath dyspnea   . Urinary frequency   . Urinary urgency     Past Surgical History:  Procedure Laterality Date  . APPENDECTOMY    . BACK SURGERY     5 times  . big toe surgery    . CARDIAC CATHETERIZATION     2010    dr Acie Fredrickson  . cataract surgery     left eye  . CHOLECYSTECTOMY N/A 07/27/2016   Procedure: LAPAROSCOPIC CHOLECYSTECTOMY;  Surgeon: Mickeal Skinner, MD;  Location: [place];  Service: General;  Laterality: N/A;  . COLONOSCOPY    . CORONARY ARTERY BYPASS GRAFT N/A 04/05/2015   Procedure: CORONARY ARTERY  BYPASS GRAFTING (CABG)X4 LIMA-LAD; SVG-DIAG1-DIAG2; SVG-PD;  Surgeon: [name], MD;  Location: [place];  Service: Open Heart Surgery;  Laterality: N/A;  . CORONARY/GRAFT ANGIOGRAPHY N/A 04/22/2018   Procedure: CORONARY/GRAFT ANGIOGRAPHY;  Surgeon: Martinique, Peter M, MD;  Location: [place] CV LAB;  Service: Cardiovascular;  Laterality: N/A;  . CYSTOSCOPY    . ENDARTERECTOMY Left 04/24/2016   Procedure: ENDARTERECTOMY LEFT CAROTID;  Surgeon: Mal Misty, MD;  Location: [place];  Service: Vascular;  Laterality: Left;  . EYE SURGERY    . FEMORAL ARTERY - POPLITEAL ARTERY BYPASS GRAFT    . ICD IMPLANT N/A 12/30/2020   Procedure: ICD IMPLANT;  Surgeon: Evans Lance, MD;  Location: [place] CV LAB;  Service: Cardiovascular;   Laterality: N/A;  . JOINT REPLACEMENT     shoulder  . LEFT HEART CATHETERIZATION WITH CORONARY ANGIOGRAM N/A 04/03/2015   Procedure: LEFT HEART CATHETERIZATION WITH CORONARY ANGIOGRAM;  Surgeon: Troy Sine, MD;  Location: [facility] CATH LAB;  Service: Cardiovascular;  Laterality: N/A;  . LUMBAR LAMINECTOMY  01/06/2013   Procedure: MICRODISCECTOMY LUMBAR LAMINECTOMY;  Surgeon: Marybelle Killings, MD;  Location: [place];  Service: Orthopedics;  Laterality: N/A;  L3-4 decompression  . LUMBAR LAMINECTOMY/DECOMPRESSION MICRODISCECTOMY  02/12/2012   Procedure: LUMBAR LAMINECTOMY/DECOMPRESSION MICRODISCECTOMY;  Surgeon: Floyce Stakes, MD;  Location: [place] NEURO ORS;  Service: Neurosurgery;  Laterality: N/A;  Lumbar four-five laminectomy  . PATCH ANGIOPLASTY Left 04/24/2016   Procedure: LEFT CAROTID ARTERY PATCH ANGIOPLASTY;  Surgeon: Mal Misty, MD;  Location: [place];  Service: Vascular;  Laterality: Left;  . RIGHT HEART CATH AND CORONARY/GRAFT ANGIOGRAPHY N/A 01/30/2019   Procedure: RIGHT HEART CATH AND CORONARY/GRAFT ANGIOGRAPHY;  Surgeon: Troy Sine, MD;  Location: [place] CV LAB;  Service: Cardiovascular;  Laterality: N/A;  . STERIOD INJECTION Right 01/09/2014   Procedure: STEROID INJECTION;  Surgeon: Mcarthur Rossetti, MD;  Location: [place];  Service: Orthopedics;  Laterality: Right;  . TEE WITHOUT CARDIOVERSION N/A 04/05/2015   Procedure: TRANSESOPHAGEAL ECHOCARDIOGRAM (TEE);  Surgeon: [name], MD;  Location: [place];  Service: Open Heart Surgery;  Laterality: N/A;  . TOTAL HIP ARTHROPLASTY Left 01/09/2014   DR Ninfa Linden  . TOTAL HIP ARTHROPLASTY Left 01/09/2014   Procedure: LEFT TOTAL HIP ARTHROPLASTY ANTERIOR APPROACH and Steroid Injection Right hip;  Surgeon: Mcarthur Rossetti, MD;  Location: [place];  Service: Orthopedics;  Laterality: Left;  . TOTAL HIP ARTHROPLASTY Right 08/15/2019  . TOTAL HIP ARTHROPLASTY Right 08/15/2019   Procedure: RIGHT TOTAL HIP ARTHROPLASTY ANTERIOR  APPROACH;  Surgeon: Mcarthur Rossetti, MD;  Location: [place];  Service: Orthopedics;  Laterality: Right;     Home Medications:  Prior to Admission medications   Medication Sig Start Date End Date Taking? Authorizing Provider  allopurinol (ZYLOPRIM) 100 MG tablet Take 100 mg by mouth daily as needed (gout). 05/29/20  Yes [provider]  amiodarone (PACERONE) 400 MG tablet Take 1 tablet (400 mg) twice daily, on 1/16 start taking 1 tablet once daily, on 1/23 start taking 0.5 tablet (200 mg) once daily. Patient taking differently: Take 200 mg by mouth daily. 12/31/20  Yes Shirley Friar, PA-C  bisoprolol (ZEBETA) 5 MG tablet TAKE 1/2 TABLET(2.5 MG) BY MOUTH DAILY Patient taking differently: Take 2.5 mg by mouth daily. 02/24/21  Yes [name], [name], MD  ELIQUIS 5 MG TABS tablet TAKE 1 TABLET(5 MG) BY MOUTH TWICE DAILY Patient taking differently: Take 5 mg by mouth 2 (two) times daily.  02/24/21  Yes [name], [name], MD  Evolocumab (REPATHA SURECLICK) 937 MG/ML SOAJ Inject 1 pen into the skin every 14 (fourteen) days. 08/30/20  Yes [name], [name], MD  furosemide (LASIX) 40 MG tablet Take 1 tablet by mouth on Mondays, Wednesdays, & Fridays, take 1/2 tablet by mouth on all other days Patient taking differently: Take 20-40 mg by mouth daily. Take 1 tablet by mouth on Mondays, Wednesdays, & Fridays, take 1/2 tablet by mouth on all other days 01/16/21  Yes Bhagat, Bhavinkumar, PA  HYDROcodone-acetaminophen (NORCO/VICODIN) 5-325 MG tablet Take 1 tablet by mouth every 6 (six) hours as needed for moderate pain.   Yes [provider]  isosorbide mononitrate (IMDUR) 30 MG 24 hr tablet Take 1 tablet (30 mg total) by mouth daily. Patient taking differently: Take 30 mg by mouth every evening. 01/29/21  Yes [name], [name], MD  nitroGLYCERIN (NITROSTAT) 0.4 MG SL tablet PLACE 1 TABLET UNDER THE TONGUE EVERY 5 MINUTES AS NEEDED FOR CHEST PAIN OR SHORTNESS OF BREATH Patient taking  differently: Place 0.4 mg under the tongue every 5 (five) minutes as needed for chest pain. 11/21/20  Yes [name], [name], MD  oxyCODONE (OXY IR/ROXICODONE) 5 MG immediate release tablet Take 1-2 tablets (5-10 mg total) by mouth every 6 (six) hours as needed for moderate pain (pain score 4-6). 08/29/19  Yes Pete Pelt, PA-C  potassium chloride SA (KLOR-CON) 20 MEQ tablet Take 0.5 tablets (10 mEq total) by mouth daily. Patient taking differently: Take 20 mEq by mouth 2 (two) times daily. 01/30/21  Yes Fay Records, MD  tamsulosin (FLOMAX) 0.4 MG CAPS capsule Take 0.4 mg by mouth at bedtime. 03/01/17  Yes [provider]    Inpatient Medications: Scheduled Meds: . amiodarone  200 mg Oral Daily  . apixaban  5 mg Oral BID  . bisoprolol  2.5 mg Oral Daily  . Chlorhexidine Gluconate Cloth  6 each Topical Daily  . furosemide  40 mg Intravenous BID  . isosorbide mononitrate  30 mg Oral QPM  . potassium chloride SA  10 mEq Oral Daily  . sodium chloride flush  3 mL Intravenous Q12H  . tamsulosin  0.4 mg Oral QHS   Continuous Infusions: . sodium chloride     PRN Meds: sodium chloride, acetaminophen, albuterol, allopurinol, ondansetron (ZOFRAN) IV, sodium chloride flush  Allergies:    Allergies  Allergen Reactions  . Zetia [Ezetimibe] Other (See Comments)    Myalgias   . Codeine Nausea And Vomiting       . Unithroid [Levothyroxine Sodium] Other (See Comments)    Caused blurry vision per pt    Social History:   Social History   Socioeconomic History  . Marital status: Married    Spouse name: Not on file  . Number of children: Not on file  . Years of education: Not on file  . Highest education level: Not on file  Occupational History  . Not on file  Tobacco Use  . Smoking status: Former Smoker    Types: Cigarettes    Quit date: 02/04/1987    Years since quitting: 34.1  . Smokeless tobacco: Former Systems developer    Types: Chew    Quit date: 07/20/2009  . Tobacco comment: quit  35+yrs ago  Vaping Use  . Vaping Use: Never used  Substance and Sexual Activity  . Alcohol use: No    Alcohol/week: 0.0 standard drinks  . Drug use: No  . Sexual activity: Not Currently  Other  Topics Concern  . Not on file  Social History Narrative  . Not on file   Social Determinants of Health   Financial Resource Strain: Not on file  Food Insecurity: Not on file  Transportation Needs: Not on file  Physical Activity: Not on file  Stress: Not on file  Social Connections: Not on file  Intimate Partner Violence: Not on file    Family History:    Family History  Problem Relation Age of Onset  . Heart disease Father   . Heart attack Father   . Heart disease Sister   . Hypertension Sister   . Heart attack Sister   . Hypertension Mother   . Diabetes Son      ROS:  Please see the history of present illness.  No syncope no bleeding no orthopnea no PND.  Has been feeling urinary frequency. All other ROS reviewed and negative.     Physical Exam/Data:   Vitals:   03/22/21 2239 03/23/21 0402 03/23/21 0817 03/23/21 0857  BP: 101/70 (!) 96/59 (!) 108/49 95/64  Pulse: 65 78 69 76  Resp: 13 14 16 15   Temp: 98.1 F (36.7 C) 97.6 F (36.4 C) 98.3 F (36.8 C)   TempSrc: Oral Oral Oral   SpO2: 100% 96% 92% 91%  Weight:  69.3 kg    Height:        Intake/Output Summary (Last 24 hours) at 03/23/2021 0904 Last data filed at 03/23/2021 0409 Gross per 24 hour  Intake 3 ml  Output 2100 ml  Net -2097 ml   Last 3 Weights 03/23/2021 03/22/2021 03/17/2021  Weight (lbs) 152 lb 12.5 oz 154 lb 1.6 oz 161 lb 12.8 oz  Weight (kg) 69.3 kg 69.899 kg 73.392 kg     Body mass index is 24.66 kg/m.  General:  Well nourished, well developed, in no acute distress HEENT: normal Lymph: no adenopathy Neck: no JVD Endocrine:  No thryomegaly Vascular: No carotid bruits Cardiac:  normal S1, S2; irregularly irregular; no murmur  Lungs:  clear to auscultation bilaterally, no wheezing, rhonchi or  rales  Abd: soft, nontender, no hepatomegaly  Ext: no edema Musculoskeletal:  No deformities, BUE and BLE strength normal and equal Skin: warm and dry  Neuro:  CNs 2-12 intact, no focal abnormalities noted Psych:  Normal affect   EKG:  The EKG was personally reviewed and demonstrates: Atrial fibrillation Telemetry:  Telemetry was personally reviewed and demonstrates: Atrial fibrillation rate controlled  Relevant CV Studies:  ECHO 12/2020:   1. Left ventricular ejection fraction, by estimation, is 35 to 40%. The  left ventricle has moderately decreased function. The left ventricle has  no regional wall motion abnormalities. There is moderate asymmetric left  ventricular hypertrophy. Left  ventricular diastolic function could not be evaluated.  2. Right ventricular systolic function is normal. The right ventricular  size is normal. There is mildly elevated pulmonary artery systolic  pressure.  3. Left atrial size was moderately dilated.  4. The mitral valve is grossly normal. No evidence of mitral valve  regurgitation.  5. The aortic valve is tricuspid. There is mild calcification of the  aortic valve. There is moderate thickening of the aortic valve. Aortic  valve regurgitation is not visualized.   Cardiac catheterization 01/30/2019: Diagnostic Dominance: Right     Laboratory Data:  High Sensitivity Troponin:   Recent Labs  Lab 03/22/21 0004 03/22/21 0200  TROPONINIHS 46* 56*     Chemistry Recent Labs  Lab 03/22/21 0004 03/23/21 0037  NA 140 137  K 4.0 3.6  CL 105 104  CO2 27 27  GLUCOSE 158* 96  BUN 19 20  CREATININE 1.52* 1.43*  CALCIUM 9.0 8.5*  GFRNONAA 46* 50*  ANIONGAP 8 6    No results for input(s): PROT, ALBUMIN, AST, ALT, ALKPHOS, BILITOT in the last 168 hours. Hematology Recent Labs  Lab 03/22/21 0004  WBC 11.4*  RBC 4.80  HGB 15.8  HCT 48.8  MCV 101.7*  MCH 32.9  MCHC 32.4  RDW 13.9  PLT 307   BNP Recent Labs  Lab  03/22/21 0004  BNP 1,273.3*    DDimer No results for input(s): DDIMER in the last 168 hours.   Radiology/Studies:  DG Chest 2 View  Result Date: 03/22/2021 CLINICAL DATA:  Chest pain and shortness of breath for 3 days. EXAM: CHEST - 2 VIEW COMPARISON:  12/31/2020 FINDINGS: Postoperative changes in the mediastinum and left shoulder. Cardiac pacemaker. Mild cardiac enlargement. Mild vascular congestion. Interstitial changes in the lung bases likely representing mild interstitial edema. This is developing since previous study. No pleural effusions. Emphysematous changes in the lungs. Calcification of the aorta. Degenerative changes in the spine and right shoulder. IMPRESSION: Cardiac enlargement with mild vascular congestion and interstitial edema. Electronically Signed   By: Lucienne Capers M.D.   On: 03/22/2021 00:46     Assessment and Plan:   Acute systolic heart failure -IV furosemide 40 mg has been administered.  EF 35 to 40%.  ICD in place secondary to prior ventricular tachycardia episode. -Overall feeling much better.  Approximately 2 L out.  Creatinine decreased from 1.5-1.4.  Eager to go home.  Coronary artery disease status post CABG -Catheterization personally reviewed from 01/30/2019-LIMA to LAD is patent with competitive LIMA flow.  Both SVG grafts were occluded to RCA as well as obtuse marginal 1.  There is collateral blood flow from the distal LAD to the RCA.  Paroxysmal atrial fibrillation -On Eliquis.  Was in atrial fibrillation in the clinic setting.  Continues to be in atrial fibrillation on the monitor.  Elevated troponin/demand ischemia -Mildly elevated at approximately 50 and flat, this is not ACS.  He did report some substernal chest discomfort last night however.  He is currently chest pain-free.  Ventricular tachycardia status post ICD -This was placed in January 2022 after sustained tachycardia.  He did have an episode of nonsustained VT lasting 22 seconds.  He is  currently on amiodarone.  Continue.  No shocks required.  No change in therapy.  Dr. Lovena Le monitoring.  Peripheral vascular disease with carotid artery stenosis, CVA -On Eliquis [place]  Chronic kidney disease stage IIIa -Stable.  Creatinine slightly improved after Lasix administration.    Okay for discharge home.  Will need Foley catheter removed.  Does not require IV Lasix this morning.  Instead of taking 1 tablet Monday Wednesday Fridays and 1/2 tablet on all other days, I would encourage him to take 1 full tablet of furosemide 40 mg daily.  I know that urinary urgency has been an issue with him.  Has follow-up with cardiology  Discussed with Dr. Ree Kida  For questions or updates, please contact [facility] Please consult [email or web address] for contact info under    Signed, Candee Furbish, MD  03/23/2021 9:04 AM

## 2021-03-23 NOTE — Care Management CC44 (Signed)
Condition Code 44 Documentation Completed  Patient Details  Name: AMARRION PASTORINO MRN: [id number] Date of Birth: 04-Nov-1941   Condition Code 44 given:  Yes Patient signature on Condition Code 44 notice:  Yes Documentation of 2 MD's agreement:  Yes Code 44 added to claim:  Yes    Carles Collet, RN 03/23/2021, 9:45 AM

## 2021-03-23 NOTE — Care Management Obs Status (Signed)
[facility] NOTIFICATION   Patient Details  Name: Ryan [name] MRN: [id number] Date of Birth: Sep 17, 1941   Medicare Observation Status Notification Given:  Yes    Carles Collet, RN 03/23/2021, 9:44 AM

## 2021-04-02 ENCOUNTER — Ambulatory Visit (INDEPENDENT_AMBULATORY_CARE_PROVIDER_SITE_OTHER): Payer: Medicare Other

## 2021-04-02 DIAGNOSIS — I472 Ventricular tachycardia, unspecified: Secondary | ICD-10-CM

## 2021-04-03 LAB — CUP PACEART REMOTE DEVICE CHECK
Battery Remaining Longevity: 180 mo
Battery Remaining Percentage: 100 %
Brady Statistic RV Percent Paced: 1 %
Date Time Interrogation Session: 20220413024400
HighPow Impedance: 74 Ohm
Implantable Lead Implant Date: 20220110
Implantable Lead Location: 753860
Implantable Lead Model: 137
Implantable Lead Serial Number: 301176
Implantable Pulse Generator Implant Date: 20220110
Lead Channel Impedance Value: 652 Ohm
Lead Channel Setting Pacing Amplitude: 3.5 V
Lead Channel Setting Pacing Pulse Width: 0.4 ms
Lead Channel Setting Sensing Sensitivity: 0.5 mV
Pulse Gen Serial Number: 211464

## 2021-04-04 ENCOUNTER — Encounter: Payer: Medicare Other | Admitting: Internal Medicine

## 2021-04-09 DIAGNOSIS — I5022 Chronic systolic (congestive) heart failure: Secondary | ICD-10-CM | POA: Diagnosis not present

## 2021-04-09 DIAGNOSIS — N400 Enlarged prostate without lower urinary tract symptoms: Secondary | ICD-10-CM | POA: Diagnosis not present

## 2021-04-09 DIAGNOSIS — E039 Hypothyroidism, unspecified: Secondary | ICD-10-CM | POA: Diagnosis not present

## 2021-04-17 NOTE — Progress Notes (Signed)
Remote ICD transmission.   

## 2021-05-06 DIAGNOSIS — I130 Hypertensive heart and chronic kidney disease with heart failure and stage 1 through stage 4 chronic kidney disease, or unspecified chronic kidney disease: Secondary | ICD-10-CM | POA: Diagnosis not present

## 2021-05-06 DIAGNOSIS — I5022 Chronic systolic (congestive) heart failure: Secondary | ICD-10-CM | POA: Diagnosis not present

## 2021-05-06 DIAGNOSIS — I25810 Atherosclerosis of coronary artery bypass graft(s) without angina pectoris: Secondary | ICD-10-CM | POA: Diagnosis not present

## 2021-05-06 DIAGNOSIS — I480 Paroxysmal atrial fibrillation: Secondary | ICD-10-CM | POA: Diagnosis not present

## 2021-05-07 ENCOUNTER — Telehealth: Payer: Self-pay | Admitting: Cardiovascular Disease

## 2021-05-07 NOTE — Telephone Encounter (Signed)
STAT if HR is under 50 or over 120 (normal HR is 60-100 beats per minute)  1) What is your heart rate? 173/132 pulse 67  2) Do you have a log of your heart rate readings (document readings)? 67 5/17, 67 5/18  3) Do you have any other symptoms? This morning pt was feeling sick didn't take rx, ate a banana felt better.     pt states that he saw his pcp about his heart speeding up, pt wants to notify Dr.[name] so pcp can fax cardiogram over, pt also has questions about rx, cant remember name of rx but assumes that this is the cause of the ongoing issue,  173/132 pulse 67 , last night was 67

## 2021-05-07 NOTE — Telephone Encounter (Signed)
Spoke with patient about HR and blood pressure.  Patient states his blood pressure this morning was 134/64 and HR 64.  Patient states to this nurse that his blood pressure has been fine and HR good but reports occasional nausea and patient advised to eat something before taking his medications.  Patient said he doesn't eat very much and advised to eat some crackers before medications.  Patient reports nausea does subside.  Patient asked if he could stop taking any of his heart medicines and he was advised to take all medications as prescribed.  Pain in shoulder blade that he is seeing Dr. Ninfa Linden next week for.  PCP did EKG and notified patient it looked good.

## 2021-05-14 ENCOUNTER — Ambulatory Visit (INDEPENDENT_AMBULATORY_CARE_PROVIDER_SITE_OTHER): Payer: Medicare Other | Admitting: Orthopaedic Surgery

## 2021-05-14 ENCOUNTER — Encounter: Payer: Self-pay | Admitting: Orthopaedic Surgery

## 2021-05-14 DIAGNOSIS — M7061 Trochanteric bursitis, right hip: Secondary | ICD-10-CM | POA: Diagnosis not present

## 2021-05-14 DIAGNOSIS — M7062 Trochanteric bursitis, left hip: Secondary | ICD-10-CM | POA: Diagnosis not present

## 2021-05-14 DIAGNOSIS — M19212 Secondary osteoarthritis, left shoulder: Secondary | ICD-10-CM | POA: Diagnosis not present

## 2021-05-14 DIAGNOSIS — I25110 Atherosclerotic heart disease of native coronary artery with unstable angina pectoris: Secondary | ICD-10-CM

## 2021-05-14 MED ORDER — METHYLPREDNISOLONE ACETATE 40 MG/ML IJ SUSP
40.0000 mg | INTRAMUSCULAR | Status: AC | PRN
  Administered 2021-05-14: 40 mg via INTRA_ARTICULAR

## 2021-05-14 MED ORDER — ACETAMINOPHEN-CODEINE #3 300-30 MG PO TABS: 1.0000 | ORAL_TABLET | ORAL | 0 refills | Status: DC | PRN

## 2021-05-14 MED ORDER — LIDOCAINE HCL 1 % IJ SOLN
3.0000 mL | INTRAMUSCULAR | Status: AC | PRN
  Administered 2021-05-14: 3 mL
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
## 2021-05-20 ENCOUNTER — Encounter: Payer: Self-pay | Admitting: Internal Medicine

## 2021-05-20 ENCOUNTER — Ambulatory Visit (INDEPENDENT_AMBULATORY_CARE_PROVIDER_SITE_OTHER): Payer: Medicare Other | Admitting: Internal Medicine

## 2021-05-20 ENCOUNTER — Other Ambulatory Visit: Payer: Self-pay

## 2021-05-20 VITALS — BP 106/60 | HR 72 | Ht 66.0 in | Wt 151.6 lb

## 2021-05-20 DIAGNOSIS — I25110 Atherosclerotic heart disease of native coronary artery with unstable angina pectoris: Secondary | ICD-10-CM

## 2021-05-20 DIAGNOSIS — Z95810 Presence of automatic (implantable) cardiac defibrillator: Secondary | ICD-10-CM | POA: Diagnosis not present

## 2021-05-20 DIAGNOSIS — I480 Paroxysmal atrial fibrillation: Secondary | ICD-10-CM

## 2021-05-20 DIAGNOSIS — I5042 Chronic combined systolic (congestive) and diastolic (congestive) heart failure: Secondary | ICD-10-CM

## 2021-05-20 DIAGNOSIS — I472 Ventricular tachycardia, unspecified: Secondary | ICD-10-CM

## 2021-05-20 NOTE — Patient Instructions (Signed)
Medication Instructions:  Your physician recommends that you continue on your current medications as directed. Please refer to the Current Medication list given to you today.  Labwork: None ordered.  Testing/Procedures: None ordered.  Follow-Up: Your physician wants you to follow-up in: one year with Cristopher Peru, MD or one of the following Advanced Practice Providers on your designated Care Team:    Chanetta Marshall, NP  [name], PA-C  Legrand Como "[name]" [place], [place]  Remote monitoring is used to monitor your ICD from home. This monitoring reduces the number of office visits required to check your device to one time per year. It allows [place] to keep an eye on the functioning of your device to ensure it is working properly. You are scheduled for a device check from home on 07/02/2021. You may send your transmission at any time that day. If you have a wireless device, the transmission will be sent automatically. After your physician reviews your transmission, you will receive a postcard with your next transmission date.  Any Other Special Instructions Will Be Listed Below (If Applicable).  If you need a refill on your cardiac medications before your next appointment, please call your pharmacy.

## 2021-05-20 NOTE — Progress Notes (Signed)
HPI Mr. Knack returns today for followup. He is a pleasant 80 yo man with a h/o LV dysfunction and VT who presented with VT back in January and underwent ICD implantation at that time. He has known CAD and is s/p MI remotely. Since his ICD insertion he notes class 2 CHF symptoms. He has not had any ICD shocks.  Allergies  Allergen Reactions  . Zetia [Ezetimibe] Other (See Comments)    Myalgias   . Codeine Nausea And Vomiting       . Unithroid [Levothyroxine Sodium] Other (See Comments)    Caused blurry vision per pt     Current Outpatient Medications  Medication Sig Dispense Refill  . acetaminophen-codeine (TYLENOL #3) 300-30 MG tablet Take 1 tablet by mouth every 4 (four) hours as needed for moderate pain. 30 tablet 0  . allopurinol (ZYLOPRIM) 100 MG tablet Take 100 mg by mouth daily as needed (gout).    Marland Kitchen amiodarone (PACERONE) 400 MG tablet Take 1 tablet (400 mg) twice daily, on 1/16 start taking 1 tablet once daily, on 1/23 start taking 0.5 tablet (200 mg) once daily. (Patient taking differently: Take 200 mg by mouth daily.) 45 tablet 3  . bisoprolol (ZEBETA) 5 MG tablet TAKE 1/2 TABLET(2.5 MG) BY MOUTH DAILY (Patient taking differently: Take 2.5 mg by mouth daily.) 45 tablet 3  . ELIQUIS 5 MG TABS tablet TAKE 1 TABLET(5 MG) BY MOUTH TWICE DAILY (Patient taking differently: Take 5 mg by mouth 2 (two) times daily.) 90 tablet 1  . Evolocumab (REPATHA SURECLICK) 440 MG/ML SOAJ Inject 1 pen into the skin every 14 (fourteen) days. 2 mL 11  . furosemide (LASIX) 40 MG tablet Take 1 tablet (40 mg total) by mouth daily. 30 tablet 1  . isosorbide mononitrate (IMDUR) 30 MG 24 hr tablet Take 1 tablet (30 mg total) by mouth daily. (Patient taking differently: Take 30 mg by mouth every evening.) 90 tablet 3  . nitroGLYCERIN (NITROSTAT) 0.4 MG SL tablet PLACE 1 TABLET UNDER THE TONGUE EVERY 5 MINUTES AS NEEDED FOR CHEST PAIN OR SHORTNESS OF BREATH (Patient taking differently: Place 0.4 mg  under the tongue every 5 (five) minutes as needed for chest pain.) 25 tablet 6  . oxyCODONE (OXY IR/ROXICODONE) 5 MG immediate release tablet Take 1-2 tablets (5-10 mg total) by mouth every 6 (six) hours as needed for moderate pain (pain score 4-6). 30 tablet 0  . potassium chloride SA (KLOR-CON) 20 MEQ tablet Take 0.5 tablets (10 mEq total) by mouth daily. (Patient taking differently: Take 20 mEq by mouth 2 (two) times daily.) 90 tablet 3  . tamsulosin (FLOMAX) 0.4 MG CAPS capsule Take 0.4 mg by mouth at bedtime.     No current facility-administered medications for this visit.     Past Medical History:  Diagnosis Date  . Arthritis   . CAD (coronary artery disease)   . Carotid artery occlusion   . Cataract    Bil eyes/worse in left eye  . CHF (congestive heart failure) ([place])   . Chronic back pain   . DVT (deep venous thrombosis) ([place])   . Dysrhythmia   . Enlarged prostate    takes Rapaflo daily  . GERD (gastroesophageal reflux disease)    occasional  . History of colon polyps   . History of gout    has colchicine prn  . History of kidney stones   . Hyperlipidemia    takes Crestor daily  . Hypertension  takes Amlodipine daily  . Hypothyroidism   . Myocardial infarction ([place])   . Peripheral vascular disease ([place])   . Pulmonary emboli ([place]) 03/20/2015   elevated d-dimer, intermediate V/Q study, atypical chest pain and SOB. Start on Xarelto 20mg  BID for 3 month  . Rapid atrial fibrillation ([place])   . Renal insufficiency   . Shortness of breath dyspnea   . Urinary frequency   . Urinary urgency     ROS:   All systems reviewed and negative except as noted in the HPI.   Past Surgical History:  Procedure Laterality Date  . APPENDECTOMY    . BACK SURGERY     5 times  . big toe surgery    . CARDIAC CATHETERIZATION     2010    dr Acie Fredrickson  . cataract surgery     left eye  . CHOLECYSTECTOMY N/A 07/27/2016   Procedure: LAPAROSCOPIC CHOLECYSTECTOMY;  Surgeon: Mickeal Skinner, MD;  Location: [facility];  Service: General;  Laterality: N/A;  . COLONOSCOPY    . CORONARY ARTERY BYPASS GRAFT N/A 04/05/2015   Procedure: CORONARY ARTERY BYPASS GRAFTING (CABG)X4 LIMA-LAD; SVG-DIAG1-DIAG2; SVG-PD;  Surgeon: [name], MD;  Location: [facility];  Service: Open Heart Surgery;  Laterality: N/A;  . CORONARY/GRAFT ANGIOGRAPHY N/A 04/22/2018   Procedure: CORONARY/GRAFT ANGIOGRAPHY;  Surgeon: Martinique, Peter M, MD;  Location: [facility];  Service: Cardiovascular;  Laterality: N/A;  . CYSTOSCOPY    . ENDARTERECTOMY Left 04/24/2016   Procedure: ENDARTERECTOMY LEFT CAROTID;  Surgeon: Mal Misty, MD;  Location: [facility];  Service: Vascular;  Laterality: Left;  . EYE SURGERY    . FEMORAL ARTERY - POPLITEAL ARTERY BYPASS GRAFT    . ICD IMPLANT N/A 12/30/2020   Procedure: ICD IMPLANT;  Surgeon: Evans Lance, MD;  Location: [facility];  Service: Cardiovascular;  Laterality: N/A;  . JOINT REPLACEMENT     shoulder  . LEFT HEART CATHETERIZATION WITH CORONARY ANGIOGRAM N/A 04/03/2015   Procedure: LEFT HEART CATHETERIZATION WITH CORONARY ANGIOGRAM;  Surgeon: Troy Sine, MD;  Location: [facility] CATH LAB;  Service: Cardiovascular;  Laterality: N/A;  . LUMBAR LAMINECTOMY  01/06/2013   Procedure: MICRODISCECTOMY LUMBAR LAMINECTOMY;  Surgeon: Marybelle Killings, MD;  Location: [facility];  Service: Orthopedics;  Laterality: N/A;  L3-4 decompression  . LUMBAR LAMINECTOMY/DECOMPRESSION MICRODISCECTOMY  02/12/2012   Procedure: LUMBAR LAMINECTOMY/DECOMPRESSION MICRODISCECTOMY;  Surgeon: Floyce Stakes, MD;  Location: [facility];  Service: Neurosurgery;  Laterality: N/A;  Lumbar four-five laminectomy  . PATCH ANGIOPLASTY Left 04/24/2016   Procedure: LEFT CAROTID ARTERY PATCH ANGIOPLASTY;  Surgeon: Mal Misty, MD;  Location: [facility];  Service: Vascular;  Laterality: Left;  . RIGHT HEART CATH AND CORONARY/GRAFT ANGIOGRAPHY N/A 01/30/2019   Procedure: RIGHT HEART CATH AND CORONARY/GRAFT  ANGIOGRAPHY;  Surgeon: Troy Sine, MD;  Location: [facility];  Service: Cardiovascular;  Laterality: N/A;  . STERIOD INJECTION Right 01/09/2014   Procedure: STEROID INJECTION;  Surgeon: Mcarthur Rossetti, MD;  Location: [facility];  Service: Orthopedics;  Laterality: Right;  . TEE WITHOUT CARDIOVERSION N/A 04/05/2015   Procedure: TRANSESOPHAGEAL ECHOCARDIOGRAM (TEE);  Surgeon: [name], MD;  Location: [facility];  Service: Open Heart Surgery;  Laterality: N/A;  . TOTAL HIP ARTHROPLASTY Left 01/09/2014   DR Ninfa Linden  . TOTAL HIP ARTHROPLASTY Left 01/09/2014   Procedure: LEFT TOTAL HIP ARTHROPLASTY ANTERIOR APPROACH and Steroid Injection Right hip;  Surgeon: Mcarthur Rossetti, MD;  [facility];  Service: Orthopedics;  Laterality: Left;  . TOTAL HIP ARTHROPLASTY Right 08/15/2019  . TOTAL HIP ARTHROPLASTY Right 08/15/2019   Procedure: RIGHT TOTAL HIP ARTHROPLASTY ANTERIOR APPROACH;  Surgeon: Mcarthur Rossetti, MD;  Location: [facility];  Service: Orthopedics;  Laterality: Right;     Family History  Problem Relation Age of Onset  . Heart disease Father   . Heart attack Father   . Heart disease Sister   . Hypertension Sister   . Heart attack Sister   . Hypertension Mother   . Diabetes Son      Social History   Socioeconomic History  . Marital status: Married    Spouse name: Not on file  . Number of children: Not on file  . Years of education: Not on file  . Highest education level: Not on file  Occupational History  . Not on file  Tobacco Use  . Smoking status: Former Smoker    Types: Cigarettes    Quit date: 02/04/1987    Years since quitting: 34.3  . Smokeless tobacco: Former Systems developer    Types: Chew    Quit date: 07/20/2009  . Tobacco comment: quit 35+yrs ago  Vaping Use  . Vaping Use: Never used  Substance and Sexual Activity  . Alcohol use: No    Alcohol/week: 0.0 standard drinks  . Drug use: No  . Sexual activity: Not Currently  Other Topics  Concern  . Not on file  Social History Narrative  . Not on file   Social Determinants of Health   Financial Resource Strain: Not on file  Food Insecurity: Not on file  Transportation Needs: Not on file  Physical Activity: Not on file  Stress: Not on file  Social Connections: Not on file  Intimate Partner Violence: Not on file     BP 106/60   Pulse 72   Ht 5\' 6"  (1.676 m)   Wt 151 lb 9.6 oz (68.8 kg)   SpO2 98%   BMI 24.47 kg/m   Physical Exam:  Well appearing NAD HEENT: Unremarkable Neck:  No JVD, no thyromegally Lymphatics:  No adenopathy Back:  No CVA tenderness Lungs:  Clear with no wheezes HEART:  Regular rate rhythm, no murmurs, no rubs, no clicks Abd:  soft, positive bowel sounds, no organomegally, no rebound, no guarding Ext:  2 plus pulses, no edema, no cyanosis, no clubbing Skin:  No rashes no nodules Neuro:  CN II through XII intact, motor grossly intact  DEVICE  Normal device function.  See PaceArt for details.   Assess/Plan: 1. VT - he has had occaisional brief episodes. He will continue dose of amio though I would imagine reducing the dose in followup if his VT remains controlled. 2. ICD - his [employer] [employer] ICD has 15 years of battery longevity. We will follow. 3. ICM - he denies anginal symptoms and he will continue with GDMT 4. PAF  - he is maintaining NSR on amiodarone. We will follow.  Carleene Overlie Porschea Borys,MD

## 2021-05-27 ENCOUNTER — Telehealth: Payer: Self-pay | Admitting: Internal Medicine

## 2021-05-27 ENCOUNTER — Telehealth: Payer: Self-pay | Admitting: Emergency Medicine

## 2021-05-27 NOTE — Telephone Encounter (Addendum)
Called the pt and he wanted to be sure that he did not have any new restrictions after his ICD went off yesterday.. I advised him that there is no change and when he is working in the yard (he enjoys it) to take breaks when he is feeling tired and if he feels dizzy and be sure that he is hydrating well. Pt agrees and will call back if he develops any further questions or problems.   Pt reminded no driving for 6 months.

## 2021-05-27 NOTE — Telephone Encounter (Signed)
Received latitude alert for episode of VT that fell in VT1 zone that was successfully terminated by ATP x 1 on 05/26/21 at 1120. Episode reviewed and discussed with Dr Lovena Le. No change in treatment plan made by Dr Lovena Le. Patient reports he was doing yard work and became extremely tired. He reports no CP, chest pressure, SOB, dizziness, or syncope. Shock plan and [place] DMV driving restrictions reviewed with patient.

## 2021-05-27 NOTE — Telephone Encounter (Signed)
    Pt said he spoke with nurse yesterday regarding his HR and he wanted to speak with a nurse again because he has some questions.

## 2021-05-31 ENCOUNTER — Other Ambulatory Visit: Payer: Self-pay | Admitting: Cardiovascular Disease

## 2021-05-31 DIAGNOSIS — I5042 Chronic combined systolic (congestive) and diastolic (congestive) heart failure: Secondary | ICD-10-CM

## 2021-06-05 ENCOUNTER — Telehealth: Payer: Self-pay | Admitting: Cardiovascular Disease

## 2021-06-05 NOTE — Telephone Encounter (Signed)
**Note De-Identified Shenise Wolgamott Obfuscation** I called the pt and he states that he is in his donut hole and cannot afford $200/30 day supply of Eliquis. We discussed Pt Asst through [employer] and he is interested in applying. He cannot see well so he asked me to contact his wife [name] [facility]) to give her [employer] information.  I called Hilda and we discussed [employer] and she is in agreement with the pt applying as neither of them are interested in the pt switching to generic Warfarin.   I gave her [employer] phone number and asked her to call with questions concerning their Eliquis program and the pts eligibility to be approved and that if it appears that he would be eligible to request that they mail them an application to their home address. She is aware that once they receive the application to complete the pt's part, obtain required documents per [employer], and to bring all to Dr Lanny Hurst office to drop off and that we will take care of the provider part of the application and will fax all to [employer].  [name] is also aware that we are leaving 2 boxes of Eliquis 5mg  samples in the front office at Dr Lanny Hurst office at [facility] for them to pick up.  She thanked me for calling her to discuss.

## 2021-06-05 NOTE — Telephone Encounter (Signed)
Pt c/o medication issue:  1. Name of Medication: ELIQUIS 5 MG TABS tablet  2. How are you currently taking this medication (dosage and times per day)? 1 tablet twice a day  3. Are you having a reaction (difficulty breathing--STAT)? no  4. What is your medication issue? Patient's wife states they tried to refill the medication and it will cost $200. She would like to know if there is another medication she can take.

## 2021-06-19 DIAGNOSIS — I130 Hypertensive heart and chronic kidney disease with heart failure and stage 1 through stage 4 chronic kidney disease, or unspecified chronic kidney disease: Secondary | ICD-10-CM | POA: Diagnosis not present

## 2021-06-19 DIAGNOSIS — N1831 Chronic kidney disease, stage 3a: Secondary | ICD-10-CM | POA: Diagnosis not present

## 2021-06-19 DIAGNOSIS — I5022 Chronic systolic (congestive) heart failure: Secondary | ICD-10-CM | POA: Diagnosis not present

## 2021-07-02 ENCOUNTER — Ambulatory Visit (INDEPENDENT_AMBULATORY_CARE_PROVIDER_SITE_OTHER): Payer: Medicare Other

## 2021-07-02 ENCOUNTER — Telehealth: Payer: Self-pay | Admitting: Cardiovascular Disease

## 2021-07-02 DIAGNOSIS — I472 Ventricular tachycardia, unspecified: Secondary | ICD-10-CM

## 2021-07-02 LAB — CUP PACEART REMOTE DEVICE CHECK
Battery Remaining Longevity: 180 mo
Battery Remaining Percentage: 100 %
Brady Statistic RV Percent Paced: 3 %
Date Time Interrogation Session: 20220713024400
HighPow Impedance: 61 Ohm
Implantable Lead Implant Date: 20220110
Implantable Lead Location: 753860
Implantable Lead Model: 137
Implantable Lead Serial Number: 301176
Implantable Pulse Generator Implant Date: 20220110
Lead Channel Impedance Value: 590 Ohm
Lead Channel Setting Pacing Amplitude: 2.5 V
Lead Channel Setting Pacing Pulse Width: 0.4 ms
Lead Channel Setting Sensing Sensitivity: 0.5 mV
Pulse Gen Serial Number: 211464

## 2021-07-09 ENCOUNTER — Other Ambulatory Visit: Payer: Self-pay | Admitting: *Deleted

## 2021-07-09 ENCOUNTER — Telehealth: Payer: Self-pay | Admitting: Cardiovascular Disease

## 2021-07-09 DIAGNOSIS — I5042 Chronic combined systolic (congestive) and diastolic (congestive) heart failure: Secondary | ICD-10-CM

## 2021-07-09 DIAGNOSIS — I480 Paroxysmal atrial fibrillation: Secondary | ICD-10-CM

## 2021-07-09 MED ORDER — ISOSORBIDE MONONITRATE ER 30 MG PO TB24: 30.0000 mg | ORAL_TABLET | Freq: Every evening | ORAL | 3 refills | Status: DC

## 2021-07-09 MED ORDER — AMIODARONE HCL 400 MG PO TABS: 200.0000 mg | ORAL_TABLET | Freq: Every day | ORAL | 3 refills | Status: DC

## 2021-07-09 MED ORDER — NITROGLYCERIN 0.4 MG SL SUBL: 0.4000 mg | SUBLINGUAL_TABLET | SUBLINGUAL | 3 refills | Status: AC | PRN

## 2021-07-09 MED ORDER — APIXABAN 5 MG PO TABS: 5.0000 mg | ORAL_TABLET | Freq: Two times a day (BID) | ORAL | 1 refills | Status: DC

## 2021-07-09 MED ORDER — BISOPROLOL FUMARATE 5 MG PO TABS: ORAL_TABLET | ORAL | 3 refills | Status: DC

## 2021-07-09 NOTE — Progress Notes (Signed)
error 

## 2021-07-09 NOTE — Telephone Encounter (Signed)
*  STAT* If patient is at the pharmacy, call can be transferred to refill team.   1. Which medications need to be refilled? (please list name of each medication and dose if known) bisoprolol (ZEBETA) 5 MG tablet isosorbide mononitrate (IMDUR) 30 MG 24 hr tablet amiodarone (PACERONE) 400 MG tablet potassium chloride SA (KLOR-CON) 20 MEQ tablet ELIQUIS 5 MG TABS tablet nitroGLYCERIN (NITROSTAT) 0.4 MG SL tablet  2. Which pharmacy/location (including street and city if local pharmacy) is medication to be sent to? [facility] - [place], [place] - [place] [address]  3. Do they need a 30 day or 90 day supply? 90  Ivy called on the 13th to have refills sent in but it wasn't. Patient will be running out in two days. Please advise

## 2021-07-09 NOTE — Telephone Encounter (Signed)
Prescription refill request for Eliquis received. Indication: afib  Last office visit: Ryan [name] 05/20/2021 Scr: 1.43, 03/23/2021 Age: 80 yo  Weight: 68.8 kg   Pt is on the correct doe of Eliquis per dsoing criteria, prescription refill sent for Eliquis 5mg  BID.

## 2021-07-14 ENCOUNTER — Telehealth: Payer: Self-pay | Admitting: Internal Medicine

## 2021-07-14 ENCOUNTER — Other Ambulatory Visit: Payer: Self-pay | Admitting: *Deleted

## 2021-07-14 MED ORDER — POTASSIUM CHLORIDE CRYS ER 20 MEQ PO TBCR: 20.0000 meq | EXTENDED_RELEASE_TABLET | Freq: Every day | ORAL | 3 refills | Status: DC

## 2021-07-14 NOTE — Telephone Encounter (Signed)
S/w to Ermalinda Barrios at [facility] pharmacy X 2.  Pt does not need a refill on potassium. S/w pt to clarify pt's dose of potassium stated spouse handles this, in pt's chart potassium is listed several different ways and different mEq.  S/w spouse per pt, spouse does not know dose of potassium will call spouse back around 2:30 when spouse gets home from work.

## 2021-07-14 NOTE — Telephone Encounter (Signed)
S/w pt's wife clarified potassium one tablet by mouth ( 20 meq) daily. Medication list updated.

## 2021-07-14 NOTE — Telephone Encounter (Signed)
*  STAT* If patient is at the pharmacy, call can be transferred to refill team.   1. Which medications need to be refilled? (please list name of each medication and dose if known) new prescription for his Potassium  2. Which pharmacy/location (including street and city if local pharmacy) is medication to be sent to?[facility]  3. Do they need a 30 day or 90 day supply? 90 days- Patient need this medicine today please

## 2021-07-20 DIAGNOSIS — E785 Hyperlipidemia, unspecified: Secondary | ICD-10-CM | POA: Diagnosis not present

## 2021-07-20 DIAGNOSIS — I5022 Chronic systolic (congestive) heart failure: Secondary | ICD-10-CM | POA: Diagnosis not present

## 2021-07-20 DIAGNOSIS — N1831 Chronic kidney disease, stage 3a: Secondary | ICD-10-CM | POA: Diagnosis not present

## 2021-07-20 DIAGNOSIS — I130 Hypertensive heart and chronic kidney disease with heart failure and stage 1 through stage 4 chronic kidney disease, or unspecified chronic kidney disease: Secondary | ICD-10-CM | POA: Diagnosis not present

## 2021-07-23 ENCOUNTER — Telehealth: Payer: Self-pay

## 2021-07-23 NOTE — Telephone Encounter (Signed)
Patient calls today to report some catching in his throat and some trouble swallowing. Says he is in no pain, but sometimes pills catch in his throat. His primary care thinks it is potentially a thyroid problem according to the patient. He had a left carotid endarterectomy in 2017. He was supposed to follow up with [facility] in 2020, but has not been to the office since 2019. Advised him to follow up with PCP and see about potential referral to a GI or ENT doctor; that his problem did not sound like a carotid issue. Placed patient on schedule to be seen for the follow up he was supposed to have.

## 2021-07-25 NOTE — Progress Notes (Signed)
Remote ICD transmission.   

## 2021-07-30 DIAGNOSIS — I480 Paroxysmal atrial fibrillation: Secondary | ICD-10-CM | POA: Diagnosis not present

## 2021-07-30 DIAGNOSIS — E039 Hypothyroidism, unspecified: Secondary | ICD-10-CM | POA: Diagnosis not present

## 2021-07-30 DIAGNOSIS — D6869 Other thrombophilia: Secondary | ICD-10-CM | POA: Diagnosis not present

## 2021-07-30 DIAGNOSIS — Z7901 Long term (current) use of anticoagulants: Secondary | ICD-10-CM | POA: Diagnosis not present

## 2021-07-30 DIAGNOSIS — H8110 Benign paroxysmal vertigo, unspecified ear: Secondary | ICD-10-CM | POA: Diagnosis not present

## 2021-07-30 DIAGNOSIS — N1831 Chronic kidney disease, stage 3a: Secondary | ICD-10-CM | POA: Diagnosis not present

## 2021-08-14 ENCOUNTER — Ambulatory Visit: Payer: Medicare Other | Attending: Internal Medicine | Admitting: Physical Therapy

## 2021-08-14 ENCOUNTER — Other Ambulatory Visit: Payer: Self-pay

## 2021-08-14 VITALS — BP 129/63 | HR 71

## 2021-08-14 DIAGNOSIS — R42 Dizziness and giddiness: Secondary | ICD-10-CM | POA: Diagnosis not present

## 2021-08-14 DIAGNOSIS — H8113 Benign paroxysmal vertigo, bilateral: Secondary | ICD-10-CM | POA: Diagnosis not present

## 2021-08-15 NOTE — Therapy (Signed)
[facility] [address] [place], [place], [address] Phone: [phone]   Fax:  [phone]  Physical Therapy Evaluation  Patient Details  Name: Ryan [name] MRN: [id number] Date of Birth: 1941/05/28 Referring Provider (PT): Sable Feil, MD   Encounter Date: 08/14/2021   PT End of Session - 08/15/21 0847     Visit Number 1    Number of Visits 1    Authorization Type Medicare    PT Start Time 72    PT Stop Time 1404    PT Time Calculation (min) 44 min    Activity Tolerance Other (comment)   limited by nausea   Behavior During Therapy WFL for tasks assessed/performed             Past Medical History:  Diagnosis Date   Arthritis    CAD (coronary artery disease)    Carotid artery occlusion    Cataract    Bil eyes/worse in left eye   CHF (congestive heart failure) (HCC)    Chronic back pain    DVT (deep venous thrombosis) (HCC)    Dysrhythmia    Enlarged prostate    takes Rapaflo daily   GERD (gastroesophageal reflux disease)    occasional   History of colon polyps    History of gout    has colchicine prn   History of kidney stones    Hyperlipidemia    takes Crestor daily   Hypertension    takes Amlodipine daily   Hypothyroidism    Myocardial infarction [facility])    Peripheral vascular disease ([place])    Pulmonary emboli ([place]) 03/20/2015   elevated d-dimer, intermediate V/Q study, atypical chest pain and SOB. Start on Xarelto '20mg'$  BID for 3 month   Rapid atrial fibrillation ([place])    Renal insufficiency    Shortness of breath dyspnea    Urinary frequency    Urinary urgency     Past Surgical History:  Procedure Laterality Date   APPENDECTOMY     BACK SURGERY     5 times   big toe surgery     CARDIAC CATHETERIZATION     2010    dr Acie Fredrickson   cataract surgery     left eye   CHOLECYSTECTOMY N/A 07/27/2016   Procedure: LAPAROSCOPIC CHOLECYSTECTOMY;  Surgeon: Mickeal Skinner, MD;   Location: [place];  Service: General;  Laterality: N/A;   COLONOSCOPY     CORONARY ARTERY BYPASS GRAFT N/A 04/05/2015   Procedure: CORONARY ARTERY BYPASS GRAFTING (CABG)X4 LIMA-LAD; SVG-DIAG1-DIAG2; SVG-PD;  Surgeon: [name], MD;  Location: [place];  Service: Open Heart Surgery;  Laterality: N/A;   CORONARY/GRAFT ANGIOGRAPHY N/A 04/22/2018   Procedure: CORONARY/GRAFT ANGIOGRAPHY;  Surgeon: Martinique, Peter M, MD;  Location: [place] CV LAB;  Service: Cardiovascular;  Laterality: N/A;   CYSTOSCOPY     ENDARTERECTOMY Left 04/24/2016   Procedure: ENDARTERECTOMY LEFT CAROTID;  Surgeon: Mal Misty, MD;  Location: [facility];  Service: Vascular;  Laterality: Left;   EYE SURGERY     FEMORAL ARTERY - POPLITEAL ARTERY BYPASS GRAFT     ICD IMPLANT N/A 12/30/2020   Procedure: ICD IMPLANT;  Surgeon: Evans Lance, MD;  Location: [place] CV LAB;  Service: Cardiovascular;  Laterality: N/A;   JOINT REPLACEMENT     shoulder   LEFT HEART CATHETERIZATION WITH CORONARY ANGIOGRAM N/A 04/03/2015   Procedure: LEFT HEART CATHETERIZATION WITH CORONARY ANGIOGRAM;  Surgeon: [place] Sine, MD;  Location: [facility]  CATH LAB;  Service: Cardiovascular;  Laterality: N/A;   LUMBAR LAMINECTOMY  01/06/2013   Procedure: MICRODISCECTOMY LUMBAR LAMINECTOMY;  Surgeon: Marybelle Killings, MD;  Location: [facility];  Service: Orthopedics;  Laterality: N/A;  L3-4 decompression   LUMBAR LAMINECTOMY/DECOMPRESSION MICRODISCECTOMY  02/12/2012   Procedure: LUMBAR LAMINECTOMY/DECOMPRESSION MICRODISCECTOMY;  Surgeon: Floyce Stakes, MD;  Location: [facility];  Service: Neurosurgery;  Laterality: N/A;  Lumbar four-five laminectomy   PATCH ANGIOPLASTY Left 04/24/2016   Procedure: LEFT CAROTID ARTERY PATCH ANGIOPLASTY;  Surgeon: Mal Misty, MD;  Location: [facility];  Service: Vascular;  Laterality: Left;   RIGHT HEART CATH AND CORONARY/GRAFT ANGIOGRAPHY N/A 01/30/2019   Procedure: RIGHT HEART CATH AND CORONARY/GRAFT ANGIOGRAPHY;  Surgeon: [place] Sine, MD;  Location: [facility] CV LAB;  Service: Cardiovascular;  Laterality: N/A;   STERIOD INJECTION Right 01/09/2014   Procedure: STEROID INJECTION;  Surgeon: Mcarthur Rossetti, MD;  Location: [facility];  Service: Orthopedics;  Laterality: Right;   TEE WITHOUT CARDIOVERSION N/A 04/05/2015   Procedure: TRANSESOPHAGEAL ECHOCARDIOGRAM (TEE);  Surgeon: [name], MD;  Location: [facility];  Service: Open Heart Surgery;  Laterality: N/A;   TOTAL HIP ARTHROPLASTY Left 01/09/2014   DR Ninfa Linden   TOTAL HIP ARTHROPLASTY Left 01/09/2014   Procedure: LEFT TOTAL HIP ARTHROPLASTY ANTERIOR APPROACH and Steroid Injection Right hip;  Surgeon: Mcarthur Rossetti, MD;  Location: [facility] [place];  Service: Orthopedics;  Laterality: Left;   TOTAL HIP ARTHROPLASTY Right 08/15/2019   TOTAL HIP ARTHROPLASTY Right 08/15/2019   Procedure: RIGHT TOTAL HIP ARTHROPLASTY ANTERIOR APPROACH;  Surgeon: Mcarthur Rossetti, MD;  Location: [facility];  Service: Orthopedics;  Laterality: Right;    Vitals:   08/14/21 1352 08/14/21 1357  BP: 140/76 129/63  Pulse: 71       Subjective Assessment - 08/14/21 1327     Subjective Pt reports the vertigo started approx. 2 months ago - pt states the vertigo is really bad today - is requesting a medication for nausea - states he got Meclizine for the dizziness last week but has had severe nausea intermittently; pt was hospitalzed 03-21-21 -03-23-21 with acute exacerbation CHF and states it seems as if the dizziness started around that time; pt reports today (08-14-21) is a really bad day for him - very dizzy and nauseous    Patient is accompained by: Family member    Pertinent History PAD, CAD, s/p Icd implant 12-30-20, Rt heart cath 01-30-19, PAF, CHF exacerbation 03-21-21 - 03-23-21 hospitalization, s/p Lt THA 2015and s/p Rt THA Aug. 2020    Patient Stated Goals resolve the vertigo    Currently in Pain? No/denies                [facility] PT Assessment - 08/15/21 0001       Assessment    Medical Diagnosis BPPV    Referring Provider (PT) Sable Feil, MD    Onset Date/Surgical Date 03/21/21    Prior Therapy none      Precautions   Precautions Fall      Restrictions   Weight Bearing Restrictions No      Balance Screen   Has the patient fallen in the past 6 months No    Has the patient had a decrease in activity level because of a fear of falling?  No    Is the patient reluctant to leave their home because of a fear of falling?  No      Prior Function   Level  of Independence Independent with basic ADLs;Independent with household mobility without device;Independent with community mobility without device                    Vestibular Assessment - 08/15/21 0001       Symptom Behavior   Subjective history of current problem pt reports he has had recurrent episodes of dizziness and nausea - states it started 2-3 months ago, however, pt later reports he thinks it started after hospitalization for CHF on 03-21-21; states he hears a sound in his ears and it also feels as if his head is pulsing    Type of Dizziness  Imbalance;Vertigo;Unsteady with head/body turns;Lightheadedness;"Funny feeling in head";Spinning    Frequency of Dizziness varies -occurs daily    Duration of Dizziness hours    Symptom Nature Spontaneous;Motion provoked    Aggravating Factors Activity in general;Sit to stand;Supine to sit    Relieving Factors Rest    Progression of Symptoms Worse    History of similar episodes pt states it started around time of hospitalization in April 2022      Oculomotor Exam   Oculomotor Alignment Normal    Spontaneous Absent      Positional Testing   Dix-Hallpike Dix-Hallpike Right;Dix-Hallpike Left    Sidelying Test Sidelying Right;Sidelying Left    Horizontal Canal Testing Horizontal Canal Right;Horizontal Canal Left      Dix-Hallpike Right   Dix-Hallpike Right Duration pt reported dizziness but no nystagmus noted    Dix-Hallpike Right Symptoms No  nystagmus      Dix-Hallpike Left   Dix-Hallpike Left Duration dizziness reported but no nystagmus noted    Dix-Hallpike Left Symptoms No nystagmus      Sidelying Right   Sidelying Right Duration minimal dizziness reported    Sidelying Right Symptoms No nystagmus      Sidelying Left   Sidelying Left Duration pt reported slight increase in dizziness after lying on Lt side approx. 5 secs    Sidelying Left Symptoms No nystagmus      Horizontal Canal Right   Horizontal Canal Right Duration none    Horizontal Canal Right Symptoms Normal      Horizontal Canal Left   Horizontal Canal Left Duration none    Horizontal Canal Left Symptoms Normal      Positional Sensitivities   Up from Right Hallpike Moderate dizziness    Up from Left Hallpike Moderate dizziness    Positional Sensitivities Comments nausea reported continuously during eval - stayed consistent in severity                Objective measurements completed on examination: See above findings.                    PT Long Term Goals - 08/15/21 0853       PT LONG TERM GOAL #1   Title N/A - eval only                    Plan - 08/15/21 0848     Clinical Impression Statement Pt reported dizziness with all positional testing but no nystagmus was provoked with any testing or movements.  Pt reported severe nausea during eval with all positional testing - requested medication for nausea (pt did report he had not taken Meclizine prior to eval appt).  BP was checked in seated and then in standing and diastolic reading did indicate orthostatic hypotension.  Full orthostatic assessment was not completed due to time  constraint.  Pt's symptoms are not fully consistent with BPPV at this time - no nystagmus was provoked with any positional testing or bed mobility movements.  Pt did report severe dizziness with supine to sit transfer.  Pt declined follow up appt for further assessment - requests to follow up with  MD for further work up to determine etiology of dizziness.    Stability/Clinical Decision Making Evolving/Moderate complexity    Clinical Decision Making Moderate    PT Frequency One time visit    PT Next Visit Plan N/A - eval only due to no BPPV at this time    Consulted and Agree with Plan of Care Patient;Family member/caregiver    Family Member Consulted wife             Patient will benefit from skilled therapeutic intervention in order to improve the following deficits and impairments:  Dizziness, Decreased balance, Difficulty walking  Visit Diagnosis: Dizziness and giddiness - Plan: PT plan of care cert/re-cert  BPPV (benign paroxysmal positional vertigo), bilateral - Plan: PT plan of care cert/re-cert     Problem List Patient Active Problem List   Diagnosis Date Noted   CHF exacerbation ([place]) 03/23/2021   Acute exacerbation of CHF (congestive heart failure) ([place]) 03/22/2021   Status post implantation of automatic cardioverter/defibrillator (AICD) 03/22/2021   Ventricular tachycardia ([place]) 12/28/2020   CHF (congestive heart failure) ([place]) 12/28/2020   NSTEMI (non-ST elevated myocardial infarction) ([place])    Acute respiratory failure with hypoxia ([place])    S/P total hip arthroplasty 08/16/2019   Unilateral primary osteoarthritis, right hip 07/18/2019   PAF (paroxysmal atrial fibrillation) ([place]) 01/27/2019   Dizziness 01/27/2019   History of pneumonia 01/27/2019   CKD (chronic kidney disease) stage 3, GFR 30-59 ml/min (HCC) 01/27/2019   Chest pain 02/07/2018   Elevated troponin 03/23/2017   Sigmoid diverticulitis 03/23/2017   Acute pyelonephritis 03/23/2017   Acute kidney injury superimposed on chronic kidney disease ([place]) 03/23/2017   Wrist pain, acute, right    Subclinical hypothyroidism    Dyspnea 02/26/2017   Shoulder blade pain 02/26/2017   SOB (shortness of breath) 02/26/2017   Chronic right shoulder pain    Chronic combined systolic and diastolic CHF  (congestive heart failure) ([place])    Hyperlipidemia LDL goal <70 01/20/2017   Chronic cholecystitis 07/27/2016   PAD (peripheral artery disease) ([place]) 07/09/2015   S/P CABG x 4 04/05/2015   Coronary artery disease involving native coronary artery with unstable angina pectoris ([place])    Atypical chest pain 03/20/2015   Carotid artery disease ([place]) 10/09/2014   PVD (peripheral vascular disease) ([place]) 10/09/2014   HTN (hypertension) 05/30/2014   Arthritis of left hip 01/09/2014   Status post THR (total hip replacement) 01/09/2014   Spinal stenosis, lumbar 01/06/2013    Class: Diagnosis of   Coronary artery disease 01/05/2013   Atherosclerosis of native artery of extremity with intermittent claudication ([place]) 10/18/2012    Cleon Thoma, [place], PT 08/15/2021, 8:57 AM  [facility] [address] [place], [place], [address] Phone: [phone]   Fax:  [phone]  Name: Ryan [name] MRN: [id number] Date of Birth: 06/19/1941

## 2021-08-18 DIAGNOSIS — I10 Essential (primary) hypertension: Secondary | ICD-10-CM | POA: Diagnosis not present

## 2021-08-18 DIAGNOSIS — I5020 Unspecified systolic (congestive) heart failure: Secondary | ICD-10-CM | POA: Diagnosis not present

## 2021-08-18 DIAGNOSIS — R42 Dizziness and giddiness: Secondary | ICD-10-CM | POA: Diagnosis not present

## 2021-08-18 DIAGNOSIS — N400 Enlarged prostate without lower urinary tract symptoms: Secondary | ICD-10-CM | POA: Diagnosis not present

## 2021-08-27 ENCOUNTER — Inpatient Hospital Stay (HOSPITAL_COMMUNITY)
Admission: EM | Admit: 2021-08-27 | Discharge: 2021-08-29 | DRG: 291 | Disposition: A | Discharge status: Home / Self Care | Payer: Medicare Other | Attending: Internal Medicine | Admitting: Internal Medicine

## 2021-08-27 ENCOUNTER — Other Ambulatory Visit: Payer: Self-pay

## 2021-08-27 ENCOUNTER — Emergency Department (HOSPITAL_COMMUNITY): Payer: Medicare Other

## 2021-08-27 ENCOUNTER — Inpatient Hospital Stay (HOSPITAL_COMMUNITY): Payer: Medicare Other

## 2021-08-27 ENCOUNTER — Telehealth: Payer: Self-pay | Admitting: Cardiovascular Disease

## 2021-08-27 ENCOUNTER — Encounter (HOSPITAL_COMMUNITY): Payer: Self-pay | Admitting: Emergency Medicine

## 2021-08-27 DIAGNOSIS — I509 Heart failure, unspecified: Secondary | ICD-10-CM | POA: Diagnosis not present

## 2021-08-27 DIAGNOSIS — R0902 Hypoxemia: Secondary | ICD-10-CM | POA: Diagnosis not present

## 2021-08-27 DIAGNOSIS — R0789 Other chest pain: Secondary | ICD-10-CM | POA: Diagnosis not present

## 2021-08-27 DIAGNOSIS — M1990 Unspecified osteoarthritis, unspecified site: Secondary | ICD-10-CM | POA: Diagnosis present

## 2021-08-27 DIAGNOSIS — Z888 Allergy status to other drugs, medicaments and biological substances status: Secondary | ICD-10-CM

## 2021-08-27 DIAGNOSIS — N401 Enlarged prostate with lower urinary tract symptoms: Secondary | ICD-10-CM | POA: Diagnosis present

## 2021-08-27 DIAGNOSIS — Z20822 Contact with and (suspected) exposure to covid-19: Secondary | ICD-10-CM | POA: Diagnosis present

## 2021-08-27 DIAGNOSIS — M549 Dorsalgia, unspecified: Secondary | ICD-10-CM | POA: Diagnosis present

## 2021-08-27 DIAGNOSIS — I252 Old myocardial infarction: Secondary | ICD-10-CM

## 2021-08-27 DIAGNOSIS — I255 Ischemic cardiomyopathy: Secondary | ICD-10-CM | POA: Diagnosis present

## 2021-08-27 DIAGNOSIS — I739 Peripheral vascular disease, unspecified: Secondary | ICD-10-CM | POA: Diagnosis present

## 2021-08-27 DIAGNOSIS — I5042 Chronic combined systolic (congestive) and diastolic (congestive) heart failure: Secondary | ICD-10-CM | POA: Diagnosis not present

## 2021-08-27 DIAGNOSIS — Z7901 Long term (current) use of anticoagulants: Secondary | ICD-10-CM | POA: Diagnosis not present

## 2021-08-27 DIAGNOSIS — Z885 Allergy status to narcotic agent status: Secondary | ICD-10-CM

## 2021-08-27 DIAGNOSIS — R1310 Dysphagia, unspecified: Secondary | ICD-10-CM | POA: Diagnosis present

## 2021-08-27 DIAGNOSIS — Z87891 Personal history of nicotine dependence: Secondary | ICD-10-CM

## 2021-08-27 DIAGNOSIS — G8929 Other chronic pain: Secondary | ICD-10-CM | POA: Diagnosis present

## 2021-08-27 DIAGNOSIS — E039 Hypothyroidism, unspecified: Secondary | ICD-10-CM | POA: Diagnosis present

## 2021-08-27 DIAGNOSIS — M109 Gout, unspecified: Secondary | ICD-10-CM | POA: Diagnosis present

## 2021-08-27 DIAGNOSIS — Z86718 Personal history of other venous thrombosis and embolism: Secondary | ICD-10-CM

## 2021-08-27 DIAGNOSIS — I4891 Unspecified atrial fibrillation: Secondary | ICD-10-CM | POA: Diagnosis not present

## 2021-08-27 DIAGNOSIS — I4819 Other persistent atrial fibrillation: Secondary | ICD-10-CM | POA: Diagnosis present

## 2021-08-27 DIAGNOSIS — Z79890 Hormone replacement therapy (postmenopausal): Secondary | ICD-10-CM

## 2021-08-27 DIAGNOSIS — R079 Chest pain, unspecified: Secondary | ICD-10-CM

## 2021-08-27 DIAGNOSIS — I517 Cardiomegaly: Secondary | ICD-10-CM | POA: Diagnosis not present

## 2021-08-27 DIAGNOSIS — I5032 Chronic diastolic (congestive) heart failure: Secondary | ICD-10-CM | POA: Diagnosis present

## 2021-08-27 DIAGNOSIS — I5021 Acute systolic (congestive) heart failure: Secondary | ICD-10-CM | POA: Diagnosis not present

## 2021-08-27 DIAGNOSIS — R42 Dizziness and giddiness: Secondary | ICD-10-CM | POA: Diagnosis present

## 2021-08-27 DIAGNOSIS — I672 Cerebral atherosclerosis: Secondary | ICD-10-CM | POA: Diagnosis not present

## 2021-08-27 DIAGNOSIS — I130 Hypertensive heart and chronic kidney disease with heart failure and stage 1 through stage 4 chronic kidney disease, or unspecified chronic kidney disease: Secondary | ICD-10-CM | POA: Diagnosis not present

## 2021-08-27 DIAGNOSIS — I25119 Atherosclerotic heart disease of native coronary artery with unspecified angina pectoris: Secondary | ICD-10-CM | POA: Diagnosis present

## 2021-08-27 DIAGNOSIS — I350 Nonrheumatic aortic (valve) stenosis: Secondary | ICD-10-CM | POA: Diagnosis present

## 2021-08-27 DIAGNOSIS — Z95810 Presence of automatic (implantable) cardiac defibrillator: Secondary | ICD-10-CM

## 2021-08-27 DIAGNOSIS — I6523 Occlusion and stenosis of bilateral carotid arteries: Secondary | ICD-10-CM | POA: Diagnosis not present

## 2021-08-27 DIAGNOSIS — Z79899 Other long term (current) drug therapy: Secondary | ICD-10-CM | POA: Diagnosis not present

## 2021-08-27 DIAGNOSIS — Z8249 Family history of ischemic heart disease and other diseases of the circulatory system: Secondary | ICD-10-CM

## 2021-08-27 DIAGNOSIS — R338 Other retention of urine: Secondary | ICD-10-CM | POA: Diagnosis present

## 2021-08-27 DIAGNOSIS — E785 Hyperlipidemia, unspecified: Secondary | ICD-10-CM | POA: Diagnosis present

## 2021-08-27 DIAGNOSIS — Z951 Presence of aortocoronary bypass graft: Secondary | ICD-10-CM

## 2021-08-27 DIAGNOSIS — J9601 Acute respiratory failure with hypoxia: Secondary | ICD-10-CM | POA: Diagnosis present

## 2021-08-27 DIAGNOSIS — N1831 Chronic kidney disease, stage 3a: Secondary | ICD-10-CM | POA: Diagnosis present

## 2021-08-27 DIAGNOSIS — Z87442 Personal history of urinary calculi: Secondary | ICD-10-CM

## 2021-08-27 DIAGNOSIS — Z8673 Personal history of transient ischemic attack (TIA), and cerebral infarction without residual deficits: Secondary | ICD-10-CM

## 2021-08-27 DIAGNOSIS — K219 Gastro-esophageal reflux disease without esophagitis: Secondary | ICD-10-CM | POA: Diagnosis present

## 2021-08-27 DIAGNOSIS — I5043 Acute on chronic combined systolic (congestive) and diastolic (congestive) heart failure: Secondary | ICD-10-CM | POA: Diagnosis present

## 2021-08-27 DIAGNOSIS — J9811 Atelectasis: Secondary | ICD-10-CM | POA: Diagnosis not present

## 2021-08-27 DIAGNOSIS — J90 Pleural effusion, not elsewhere classified: Secondary | ICD-10-CM | POA: Diagnosis not present

## 2021-08-27 LAB — BASIC METABOLIC PANEL
Anion gap: 11 (ref 5–15)
BUN: 17 mg/dL (ref 8–23)
CO2: 21 mmol/L — ABNORMAL LOW (ref 22–32)
Calcium: 9.1 mg/dL (ref 8.9–10.3)
Chloride: 104 mmol/L (ref 98–111)
Creatinine, Ser: 1.43 mg/dL — ABNORMAL HIGH (ref 0.61–1.24)
GFR, Estimated: 50 mL/min — ABNORMAL LOW (ref >60)
Glucose, Bld: 129 mg/dL — ABNORMAL HIGH (ref 70–99)
Potassium: 4.4 mmol/L (ref 3.5–5.1)
Sodium: 136 mmol/L (ref 135–145)

## 2021-08-27 LAB — CBC
HCT: 46.2 % (ref 39.0–52.0)
Hemoglobin: 14.8 g/dL (ref 13.0–17.0)
MCH: 33 pg (ref 26.0–34.0)
MCHC: 32 g/dL (ref 30.0–36.0)
MCV: 103.1 fL — ABNORMAL HIGH (ref 80.0–100.0)
Platelets: 257 10*3/uL (ref 150–400)
RBC: 4.48 MIL/uL (ref 4.22–5.81)
RDW: 13.7 % (ref 11.5–15.5)
WBC: 11.1 10*3/uL — ABNORMAL HIGH (ref 4.0–10.5)
nRBC: 0 % (ref 0.0–0.2)

## 2021-08-27 LAB — BRAIN NATRIURETIC PEPTIDE: B Natriuretic Peptide: 1366.1 pg/mL — ABNORMAL HIGH (ref 0.0–100.0)

## 2021-08-27 LAB — TROPONIN I (HIGH SENSITIVITY)
Troponin I (High Sensitivity): 30 ng/L — ABNORMAL HIGH (ref <18)
Troponin I (High Sensitivity): 34 ng/L — ABNORMAL HIGH (ref <18)

## 2021-08-27 LAB — RESP PANEL BY RT-PCR (FLU A&B, COVID) ARPGX2
Influenza A by PCR: NEGATIVE
Influenza B by PCR: NEGATIVE
SARS Coronavirus 2 by RT PCR: NEGATIVE

## 2021-08-27 LAB — TSH: TSH: 11.949 u[IU]/mL — ABNORMAL HIGH (ref 0.350–4.500)

## 2021-08-27 LAB — D-DIMER, QUANTITATIVE: D-Dimer, Quant: 2.05 ug/mL-FEU — ABNORMAL HIGH (ref 0.00–0.50)

## 2021-08-27 MED ORDER — LOSARTAN POTASSIUM 25 MG PO TABS
25.0000 mg | ORAL_TABLET | Freq: Every day | ORAL | Status: DC
  Administered 2021-08-28 – 2021-08-29 (×2): 25 mg via ORAL
  Filled 2021-08-27 (×2): qty 1

## 2021-08-27 MED ORDER — FUROSEMIDE 10 MG/ML IJ SOLN
40.0000 mg | Freq: Every day | INTRAMUSCULAR | Status: DC
  Administered 2021-08-28 – 2021-08-29 (×2): 40 mg via INTRAVENOUS
  Filled 2021-08-27 (×2): qty 4

## 2021-08-27 MED ORDER — SODIUM CHLORIDE 0.9% FLUSH
3.0000 mL | Freq: Two times a day (BID) | INTRAVENOUS | Status: DC
  Administered 2021-08-27 – 2021-08-29 (×4): 3 mL via INTRAVENOUS

## 2021-08-27 MED ORDER — SPIRONOLACTONE 12.5 MG HALF TABLET
12.5000 mg | ORAL_TABLET | Freq: Every day | ORAL | Status: DC
  Administered 2021-08-28 – 2021-08-29 (×2): 12.5 mg via ORAL
  Filled 2021-08-27 (×2): qty 1

## 2021-08-27 MED ORDER — ACETAMINOPHEN 325 MG PO TABS: 650.0000 mg | ORAL_TABLET | ORAL | Status: DC | PRN

## 2021-08-27 MED ORDER — ALLOPURINOL 100 MG PO TABS: 100.0000 mg | ORAL_TABLET | Freq: Every day | ORAL | Status: DC | PRN

## 2021-08-27 MED ORDER — HYDRALAZINE HCL 25 MG PO TABS: 25.0000 mg | ORAL_TABLET | Freq: Four times a day (QID) | ORAL | Status: DC | PRN

## 2021-08-27 MED ORDER — SODIUM CHLORIDE 0.9 % IV SOLN: 250.0000 mL | INTRAVENOUS | Status: DC | PRN

## 2021-08-27 MED ORDER — EMPAGLIFLOZIN 10 MG PO TABS
10.0000 mg | ORAL_TABLET | Freq: Every day | ORAL | Status: DC
  Administered 2021-08-28 – 2021-08-29 (×2): 10 mg via ORAL
  Filled 2021-08-27 (×2): qty 1

## 2021-08-27 MED ORDER — AMIODARONE HCL 200 MG PO TABS
200.0000 mg | ORAL_TABLET | Freq: Every day | ORAL | Status: DC
  Administered 2021-08-28 – 2021-08-29 (×2): 200 mg via ORAL
  Filled 2021-08-27 (×2): qty 1

## 2021-08-27 MED ORDER — ONDANSETRON HCL 4 MG/2ML IJ SOLN: 4.0000 mg | Freq: Four times a day (QID) | INTRAMUSCULAR | Status: DC | PRN

## 2021-08-27 MED ORDER — LORAZEPAM 2 MG/ML IJ SOLN
0.5000 mg | Freq: Once | INTRAMUSCULAR | Status: AC
  Administered 2021-08-27: 0.5 mg via INTRAVENOUS

## 2021-08-27 MED ORDER — TAMSULOSIN HCL 0.4 MG PO CAPS
0.4000 mg | ORAL_CAPSULE | Freq: Every day | ORAL | Status: DC
  Administered 2021-08-28: 0.4 mg via ORAL
  Filled 2021-08-27: qty 1

## 2021-08-27 MED ORDER — APIXABAN 5 MG PO TABS: 5.0000 mg | ORAL_TABLET | Freq: Two times a day (BID) | ORAL | Status: DC

## 2021-08-27 MED ORDER — GUAIFENESIN ER 600 MG PO TB12
1200.0000 mg | ORAL_TABLET | Freq: Two times a day (BID) | ORAL | Status: DC
  Administered 2021-08-28 – 2021-08-29 (×3): 1200 mg via ORAL
  Filled 2021-08-27 (×3): qty 2

## 2021-08-27 MED ORDER — SODIUM CHLORIDE 0.9% FLUSH: 3.0000 mL | INTRAVENOUS | Status: DC | PRN

## 2021-08-27 MED ORDER — LEVOTHYROXINE SODIUM 25 MCG PO TABS
25.0000 ug | ORAL_TABLET | Freq: Every day | ORAL | Status: DC
  Administered 2021-08-29: 25 ug via ORAL
  Filled 2021-08-27: qty 1

## 2021-08-27 MED ORDER — IPRATROPIUM-ALBUTEROL 0.5-2.5 (3) MG/3ML IN SOLN
3.0000 mL | Freq: Two times a day (BID) | RESPIRATORY_TRACT | Status: DC
  Administered 2021-08-28: 3 mL via RESPIRATORY_TRACT
  Filled 2021-08-27: qty 3

## 2021-08-27 MED ORDER — FINASTERIDE 5 MG PO TABS
5.0000 mg | ORAL_TABLET | Freq: Every day | ORAL | Status: DC
  Administered 2021-08-28 – 2021-08-29 (×2): 5 mg via ORAL
  Filled 2021-08-27 (×2): qty 1

## 2021-08-27 MED ORDER — IOHEXOL 350 MG/ML SOLN
60.0000 mL | Freq: Once | INTRAVENOUS | Status: AC | PRN
  Administered 2021-08-27: 60 mL via INTRAVENOUS

## 2021-08-27 MED ORDER — CARVEDILOL 3.125 MG PO TABS
3.1250 mg | ORAL_TABLET | Freq: Two times a day (BID) | ORAL | Status: DC
  Administered 2021-08-28 – 2021-08-29 (×2): 3.125 mg via ORAL
  Filled 2021-08-27 (×2): qty 1

## 2021-08-27 MED ORDER — DILTIAZEM HCL 25 MG/5ML IV SOLN
10.0000 mg | Freq: Once | INTRAVENOUS | Status: AC
  Administered 2021-08-27: 10 mg via INTRAVENOUS
  Filled 2021-08-27: qty 5

## 2021-08-27 MED ORDER — ISOSORBIDE MONONITRATE ER 30 MG PO TB24: 30.0000 mg | ORAL_TABLET | Freq: Every evening | ORAL | Status: DC

## 2021-08-27 MED ORDER — FUROSEMIDE 10 MG/ML IJ SOLN
60.0000 mg | Freq: Once | INTRAMUSCULAR | Status: AC
  Administered 2021-08-27: 60 mg via INTRAVENOUS
  Filled 2021-08-27: qty 6

## 2021-08-27 MED ORDER — HYDRALAZINE HCL 25 MG PO TABS
25.0000 mg | ORAL_TABLET | Freq: Once | ORAL | Status: AC
  Administered 2021-08-27: 25 mg via ORAL
  Filled 2021-08-27: qty 1

## 2021-08-27 MED ORDER — LORAZEPAM 2 MG/ML IJ SOLN
INTRAMUSCULAR | Status: AC
  Filled 2021-08-27: qty 1

## 2021-08-27 MED ORDER — MECLIZINE HCL 25 MG PO TABS
25.0000 mg | ORAL_TABLET | Freq: Three times a day (TID) | ORAL | Status: DC | PRN
  Filled 2021-08-27: qty 1

## 2021-08-27 MED ORDER — IPRATROPIUM-ALBUTEROL 0.5-2.5 (3) MG/3ML IN SOLN
3.0000 mL | Freq: Four times a day (QID) | RESPIRATORY_TRACT | Status: DC
  Administered 2021-08-27: 3 mL via RESPIRATORY_TRACT
  Filled 2021-08-27: qty 3

## 2021-08-27 NOTE — Telephone Encounter (Signed)
Pt c/o of Chest Pain: STAT if CP now or developed within 24 hours  1. Are you having CP right now? Yes   2. Are you experiencing any other symptoms (ex. SOB, nausea, vomiting, sweating)? SOB, nausea, fill like patient has to urine all the time, sweating, dizziness; off-balance  3. How long have you been experiencing CP? A week  4. Is your CP continuous or coming and going? Continuous   5. Have you taken Nitroglycerin? No  Patient says that he feels like he wants to lay down and dying.  ?

## 2021-08-27 NOTE — Progress Notes (Signed)
Pt transported to 2c 17 from the ED without complications. RT will continue to monitor.

## 2021-08-27 NOTE — ED Notes (Addendum)
Dr. Roosevelt Locks notified that pt did not get all his oral meds.

## 2021-08-27 NOTE — Consult Note (Addendum)
Cardiology Consultation:   Patient ID: Ryan [name]; ZC:[id number]; [birth year]   Admit date: 08/27/2021 Date of Consult: 08/27/2021  Primary Care Provider: Ginger Organ., MD Primary Cardiologist: Dr. Mertie Moores, MD  Primary Electrophysiologist: Dr. Cristopher Peru, MD   Patient Profile:   Ryan [name] is a 80 y.o. male with a hx of CAD s/p CABG 2016 with most recent cath 01/2019 showing patient LIMA to LAD with competitive LIMA flow and occluded SVG to RCA/OM1 grafts, chronic systolic CHF with LVEF at 35-40%, hx of VT s/p ICD 12/2020, paroxysmal atrial fibrillation, DVT/PE on Eliquis, PVD with carotid stenosis, CVA, and CKD stage IIIa who is being seen today for the evaluation of heart failure exacerbation and atrial fibrillation at the request of Dr. Roosevelt Locks.  History of Present Illness:   Ryan [name] is an 80yo M with a hx as stated above who presented to [facility] with dizziness and progressive SOB with evidence of fluid volume overload. He report s that his SOB has been occurring for the last several months however has more recently worsened. He also has been having issues with profound dizziness/nausea with sitting and standing. He has seen his PCP and was told this was not vertigo. He was given meclizine with mild improvement. He reports chest pain however this is chronic and unchanged from prior.   In the ED, he is noted to be in AF however per chart review, this has been consistent since 03/2021. He is on [facility] with Eliquis. BNP on ED arrival at 1366. HsT 30>>34. CXR with stable borderline cardiomegaly and no radiographic evidence of acute . cardiopulmonary process. Head CT negative pending. He was given IV Lasix with great response. He was initially placed on BiPAP however can likely be transitioned to [place].   Ryan [name] has a complicated PMH including CAD s/p prior myocardial infarction and subsequent CABG in [id number], combined systolic and diastolic heart failure, carotid artery disease,  hypertension, hyperlipidemia, hypothyroidism, paroxysmal atrial fibrillation. He underwent left CEA with Dr. Kellie Simmering in 04/2016.  He was diagnosed with atrial fibrillation in 02/2017 during an admission and was placed on Eliquis and amiodarone. He was seen in the hospital again in 01/2018 with chest pain and mildly abnormal troponin levels. Stress Myoview was abnormal but he declined cardiac catheterization at that time. He ultimately underwent cardiac catheterization in 04/2018 due to worsening anginal symptoms which demonstrated three-vessel CAD. With patent LIMA-LAD however, SVG-RI and SVG-RPDA were both occluded noted to have no good targets for PCI and medical therapy was recommended.  Echocardiogram in 12/2020 with EF with LVEF at 35-40%.   Past Medical History:  Diagnosis Date   Arthritis    CAD (coronary artery disease)    Carotid artery occlusion    Cataract    Bil eyes/worse in left eye   CHF (congestive heart failure) (HCC)    Chronic back pain    DVT (deep venous thrombosis) (HCC)    Dysrhythmia    Enlarged prostate    takes Rapaflo daily   GERD (gastroesophageal reflux disease)    occasional   History of colon polyps    History of gout    has colchicine prn   History of kidney stones    Hyperlipidemia    takes Crestor daily   Hypertension    takes Amlodipine daily   Hypothyroidism    Myocardial infarction [facility])    Peripheral vascular disease ([place])    Pulmonary emboli ([place]) 03/20/2015   elevated d-dimer,  intermediate V/Q study, atypical chest pain and SOB. Start on Xarelto '20mg'$  BID for 3 month   Rapid atrial fibrillation ([place])    Renal insufficiency    Shortness of breath dyspnea    Urinary frequency    Urinary urgency     Past Surgical History:  Procedure Laterality Date   APPENDECTOMY     BACK SURGERY     5 times   big toe surgery     CARDIAC CATHETERIZATION     2010    dr Acie Fredrickson   cataract surgery     left eye   CHOLECYSTECTOMY N/A 07/27/2016   Procedure:  LAPAROSCOPIC CHOLECYSTECTOMY;  Surgeon: Mickeal Skinner, MD;  Location: [place];  Service: General;  Laterality: N/A;   COLONOSCOPY     CORONARY ARTERY BYPASS GRAFT N/A 04/05/2015   Procedure: CORONARY ARTERY BYPASS GRAFTING (CABG)X4 LIMA-LAD; SVG-DIAG1-DIAG2; SVG-PD;  Surgeon: [name], MD;  Location: [place];  Service: Open Heart Surgery;  Laterality: N/A;   CORONARY/GRAFT ANGIOGRAPHY N/A 04/22/2018   Procedure: CORONARY/GRAFT ANGIOGRAPHY;  Surgeon: Martinique, Peter M, MD;  Location: [place] CV LAB;  Service: Cardiovascular;  Laterality: N/A;   CYSTOSCOPY     ENDARTERECTOMY Left 04/24/2016   Procedure: ENDARTERECTOMY LEFT CAROTID;  Surgeon: Mal Misty, MD;  Location: [place];  Service: Vascular;  Laterality: Left;   EYE SURGERY     FEMORAL ARTERY - POPLITEAL ARTERY BYPASS GRAFT     ICD IMPLANT N/A 12/30/2020   Procedure: ICD IMPLANT;  Surgeon: Evans Lance, MD;  Location: [place] CV LAB;  Service: Cardiovascular;  Laterality: N/A;   JOINT REPLACEMENT     shoulder   LEFT HEART CATHETERIZATION WITH CORONARY ANGIOGRAM N/A 04/03/2015   Procedure: LEFT HEART CATHETERIZATION WITH CORONARY ANGIOGRAM;  Surgeon: Troy Sine, MD;  Location: [facility] CATH LAB;  Service: Cardiovascular;  Laterality: N/A;   LUMBAR LAMINECTOMY  01/06/2013   Procedure: MICRODISCECTOMY LUMBAR LAMINECTOMY;  Surgeon: Marybelle Killings, MD;  Location: [place];  Service: Orthopedics;  Laterality: N/A;  L3-4 decompression   LUMBAR LAMINECTOMY/DECOMPRESSION MICRODISCECTOMY  02/12/2012   Procedure: LUMBAR LAMINECTOMY/DECOMPRESSION MICRODISCECTOMY;  Surgeon: Floyce Stakes, MD;  Location: [place] NEURO ORS;  Service: Neurosurgery;  Laterality: N/A;  Lumbar four-five laminectomy   PATCH ANGIOPLASTY Left 04/24/2016   Procedure: LEFT CAROTID ARTERY PATCH ANGIOPLASTY;  Surgeon: Mal Misty, MD;  Location: [place];  Service: Vascular;  Laterality: Left;   RIGHT HEART CATH AND CORONARY/GRAFT ANGIOGRAPHY N/A 01/30/2019   Procedure: RIGHT  HEART CATH AND CORONARY/GRAFT ANGIOGRAPHY;  Surgeon: Troy Sine, MD;  Location: [place] CV LAB;  Service: Cardiovascular;  Laterality: N/A;   STERIOD INJECTION Right 01/09/2014   Procedure: STEROID INJECTION;  Surgeon: Mcarthur Rossetti, MD;  Location: [place];  Service: Orthopedics;  Laterality: Right;   TEE WITHOUT CARDIOVERSION N/A 04/05/2015   Procedure: TRANSESOPHAGEAL ECHOCARDIOGRAM (TEE);  Surgeon: [name], MD;  Location: [place];  Service: Open Heart Surgery;  Laterality: N/A;   TOTAL HIP ARTHROPLASTY Left 01/09/2014   DR Ninfa Linden   TOTAL HIP ARTHROPLASTY Left 01/09/2014   Procedure: LEFT TOTAL HIP ARTHROPLASTY ANTERIOR APPROACH and Steroid Injection Right hip;  Surgeon: Mcarthur Rossetti, MD;  Location: [place];  Service: Orthopedics;  Laterality: Left;   TOTAL HIP ARTHROPLASTY Right 08/15/2019   TOTAL HIP ARTHROPLASTY Right 08/15/2019   Procedure: RIGHT TOTAL HIP ARTHROPLASTY ANTERIOR APPROACH;  Surgeon: Mcarthur Rossetti, MD;  Location: [place];  Service: Orthopedics;  Laterality: Right;  Prior to Admission medications   Medication Sig Start Date End Date Taking? Authorizing Provider  allopurinol (ZYLOPRIM) 100 MG tablet Take 100 mg by mouth daily as needed (gout). 05/29/20  Yes [provider]  amiodarone (PACERONE) 400 MG tablet Take 0.5 tablets (200 mg total) by mouth daily. 07/09/21  Yes Evans Lance, MD  apixaban (ELIQUIS) 5 MG TABS tablet Take 1 tablet (5 mg total) by mouth 2 (two) times daily. 07/09/21  Yes [name], [name], MD  bisoprolol (ZEBETA) 5 MG tablet TAKE 1/2 TABLET(2.5 MG) BY MOUTH DAILY Patient taking differently: Take 2.5 mg by mouth daily. 07/09/21  Yes Evans Lance, MD  Evolocumab (REPATHA SURECLICK) [id number] MG/ML SOAJ Inject 1 pen into the skin every 14 (fourteen) days. 08/30/20  Yes [name], [name], MD  finasteride (PROSCAR) 5 MG tablet Take 5 mg by mouth daily. 08/18/21  Yes [provider]  furosemide (LASIX) 40 MG  tablet Take 1 tablet (40 mg total) by mouth daily. 03/23/21 03/23/22 Yes Mikhail, Velta Addison, DO  isosorbide mononitrate (IMDUR) 30 MG 24 hr tablet Take 1 tablet (30 mg total) by mouth every evening. 07/09/21  Yes Evans Lance, MD  levothyroxine (SYNTHROID) 25 MCG tablet Take 25 mcg by mouth daily. 07/02/21  Yes [provider]  meclizine (ANTIVERT) 25 MG tablet Take 25 mg by mouth every 8 (eight) hours as needed for dizziness. 07/30/21  Yes [provider]  nitroGLYCERIN (NITROSTAT) 0.4 MG SL tablet Place 1 tablet (0.4 mg total) under the tongue every 5 (five) minutes as needed for chest pain. 07/09/21  Yes Evans Lance, MD  potassium chloride SA (KLOR-CON) 20 MEQ tablet Take 1 tablet (20 mEq total) by mouth daily. 07/14/21  Yes [name], [name], MD  tamsulosin (FLOMAX) 0.4 MG CAPS capsule Take 0.4 mg by mouth at bedtime. 03/01/17  Yes [provider]  acetaminophen-codeine (TYLENOL #3) 300-30 MG tablet Take 1 tablet by mouth every 4 (four) hours as needed for moderate pain. Patient not taking: No sig reported 05/14/21   Pete Pelt, PA-C    Inpatient Medications: Scheduled Meds:  amiodarone  200 mg Oral Daily   apixaban  5 mg Oral BID   carvedilol  3.125 mg Oral BID WC   finasteride  5 mg Oral Daily   [START ON 08/28/2021] furosemide  40 mg Intravenous Daily   guaiFENesin  1,200 mg Oral BID   ipratropium-albuterol  3 mL Nebulization Q6H   [START ON 08/28/2021] isosorbide mononitrate  30 mg Oral QPM   levothyroxine  25 mcg Oral Daily   LORazepam       sodium chloride flush  3 mL Intravenous Q12H   tamsulosin  0.4 mg Oral QHS   Continuous Infusions:  sodium chloride     PRN Meds: sodium chloride, acetaminophen, allopurinol, hydrALAZINE, meclizine, ondansetron (ZOFRAN) IV, sodium chloride flush  Allergies:    Allergies  Allergen Reactions   Zetia [Ezetimibe] Other (See Comments)    Myalgias    Codeine Nausea And Vomiting        Unithroid [Levothyroxine  Sodium] Other (See Comments)    Caused blurry vision per pt    Social History:   Social History   Socioeconomic History   Marital status: Married    Spouse name: Not on file   Number of children: Not on file   Years of education: Not on file   Highest education level: Not on file  Occupational History   Not on file  Tobacco Use   Smoking status: Former    Types: Cigarettes    Quit date: 02/04/1987    Years since quitting: 34.5   Smokeless tobacco: Former    Types: Chew    Quit date: 07/20/2009   Tobacco comments:    quit 35+yrs ago  Vaping Use   Vaping Use: Never used  Substance and Sexual Activity   Alcohol use: No    Alcohol/week: 0.0 standard drinks   Drug use: No   Sexual activity: Not Currently  Other Topics Concern   Not on file  Social History Narrative   Not on file   Social Determinants of Health   Financial Resource Strain: Not on file  Food Insecurity: Not on file  Transportation Needs: Not on file  Physical Activity: Not on file  Stress: Not on file  Social Connections: Not on file  Intimate Partner Violence: Not on file    Family History:   Family History  Problem Relation Age of Onset   Heart disease Father    Heart attack Father    Heart disease Sister    Hypertension Sister    Heart attack Sister    Hypertension Mother    Diabetes Son    Family Status:  Family Status  Relation Name Status   Father  Deceased   Sister  Deceased   Mother  Deceased at age 76   Brother  Alive   Son  (Not Specified)   MGM  Deceased   MGF  Deceased   PGM  Deceased   PGF  Deceased    ROS:  Please see the history of present illness.  All other ROS reviewed and negative.     Physical Exam/Data:   Vitals:   08/27/21 1356 08/27/21 1401 08/27/21 1432 08/27/21 1500  BP:  (!) 186/120 (!) 140/93   Pulse: 96 95 94   Resp: (!) 34 (!) 28 (!) 25   Temp:      SpO2: 92% 97% 96% 96%   No intake or output data in the 24 hours ending 08/27/21 1627 There were  no vitals filed for this visit. There is no height or weight on file to calculate BMI.   Physical exam per MD as below   EKG:  The EKG was personally reviewed and demonstrates:  AF with HR 64bpm  Telemetry:  Telemetry was personally reviewed and demonstrates: 08/27/21 AF with HR 60's   Relevant CV Studies:  ECHO 12/2020:   1. Left ventricular ejection fraction, by estimation, is 35 to 40%. The  left ventricle has moderately decreased function. The left ventricle has  no regional wall motion abnormalities. There is moderate asymmetric left  ventricular hypertrophy. Left  ventricular diastolic function could not be evaluated.   2. Right ventricular systolic function is normal. The right ventricular  size is normal. There is mildly elevated pulmonary artery systolic  pressure.   3. Left atrial size was moderately dilated.   4. The mitral valve is grossly normal. No evidence of mitral valve  regurgitation.   5. The aortic valve is tricuspid. There is mild calcification of the  aortic valve. There is moderate thickening of the aortic valve. Aortic  valve regurgitation is not visualized.    Cardiac catheterization 01/30/2019:  Diagnostic Dominance: Right     Laboratory Data:  Chemistry Recent Labs  Lab 08/27/21 1105  NA 136  K 4.4  CL 104  CO2 21*  GLUCOSE 129*  BUN 17  CREATININE 1.43*  CALCIUM  9.1  GFRNONAA 50*  ANIONGAP 11    Total Protein  Date Value Ref Range Status  07/29/2020 6.5 6.0 - 8.5 g/dL Final   Albumin  Date Value Ref Range Status  07/29/2020 3.9 3.7 - 4.7 g/dL Final   AST  Date Value Ref Range Status  07/29/2020 22 0 - 40 IU/L Final   ALT  Date Value Ref Range Status  07/29/2020 13 0 - 44 IU/L Final   Alkaline Phosphatase  Date Value Ref Range Status  07/29/2020 70 48 - 121 IU/L Final   Bilirubin Total  Date Value Ref Range Status  07/29/2020 0.5 0.0 - 1.2 mg/dL Final   Hematology Recent Labs  Lab 08/27/21 1105  WBC 11.1*  RBC 4.48   HGB 14.8  HCT 46.2  MCV 103.1*  MCH 33.0  MCHC 32.0  RDW 13.7  PLT 257   Cardiac EnzymesNo results for input(s): TROPONINI in the last 168 hours. No results for input(s): TROPIPOC in the last 168 hours.  BNP Recent Labs  Lab 08/27/21 1246  BNP 1,366.1*    DDimer  Recent Labs  Lab 08/27/21 1247  DDIMER 2.05*   TSH:  Lab Results  Component Value Date   TSH 7.434 (H) 03/22/2021   Lipids: Lab Results  Component Value Date   CHOL 99 (L) 07/29/2020   HDL 39 (L) 07/29/2020   LDLCALC 45 07/29/2020   TRIG 72 07/29/2020   CHOLHDL 2.5 07/29/2020   HgbA1c: Lab Results  Component Value Date   HGBA1C 6.1 (H) 12/28/2020    Radiology/Studies:  DG Chest 2 View  Result Date: 08/27/2021 CLINICAL DATA:  Chest pain EXAM: CHEST - 2 VIEW COMPARISON:  Chest radiograph 03/22/2021 FINDINGS: A left chest wall cardiac device and single associated lead are stable. Median sternotomy wires and mediastinal surgical clips are again noted. The heart is at the upper limits of normal for size, unchanged. Lung volumes are low. Patchy opacities in the lateral left base likely reflect atelectasis or scar. There is no new focal airspace disease. There is no significant pleural effusion. There is no pneumothorax. Left shoulder hardware is again noted. There is degenerative change of the right shoulder. There is no acute osseous abnormality. Upper abdominal surgical clips are again noted. IMPRESSION: Stable borderline cardiomegaly. No radiographic evidence of acute cardiopulmonary process. Electronically Signed   By: Valetta Mole M.D.   On: 08/27/2021 11:47    Assessment and Plan:   1. Acute on chronic systolic CHF: -Pt presented with dizziness and progressive SOB with evidence of fluid volume overload. He reports that his SOB has been occurring for the last several months however has more recently worsened. He also has been having issues with profound dizziness/nausea with sitting and standing. He has seen  his PCP and was told this was not vertigo. He was given meclizine with mild improvement. He reports chest pain however this is chronic and unchanged from prior.  -In the ED, he is noted to be in AF however per chart review, this has been consistent since 03/2021. He is on [facility] with Eliquis.  -BNP on ED arrival at 1366.  -HsT 30>>34.  -CXR with stable borderline cardiomegaly and no radiographic evidence of acute cardiopulmonary process.  -Head CT negative pending.  -He was given IV Lasix with great response. He was initially placed on BiPAP however can likely be transitioned to [place]. -Start IV Lasix '40mg'$  QD, Jardiance 10, Losatan 25 QD and Spironolactone 12.'5mg'$  QD -Follow renal function with daily  BMET   2. Dizziness: -Has been evaluated by PCP and was started on meclizine with poor improvement -Head CT pending -No LOC   3. CAD s/p CABG: -Has chronic angina, noted to be unchanged from prior symptoms  4. PAF: -Noted on EKGs form 03/2021. He is on chronic anticoagulation with Eliquis -Continue current regimen with amiodarone and Eliquis   -HR stable   5. HLD: -Continue statin   6. CKD stage III: -Creatinine, 1.4  -Follow with diuresis   For questions or updates, please contact [place] Please consult [email or web address] for contact info under Cardiology/STEMI.   SignedKathyrn Drown NP-C [facility] Pager: [phone] 08/27/2021 4:27 PM As above, patient seen and examined.  Briefly he is an 80 year old male with coronary artery disease status post coronary bypass and graft, ischemic cardiomyopathy, chronic systolic congestive heart failure, prior ICD, persistent atrial fibrillation, prior CVA, chronic stage IIIa kidney disease for evaluation of acute on chronic combined systolic/diastolic congestive heart failure.  Most recent echocardiogram January 2022 showed ejection fraction 35 to 40%, moderate left atrial enlargement.  Patient presents with complaints of dizziness and nausea both with  standing and lying.  He also describes dyspnea for approximately 2 months.  It has slowly worsened.  He has dyspnea on exertion and probable orthopnea.  He denies pedal edema or increased weight.  He states he has occasional chest pain that can last 5 minutes to 1 hour.  This appears to be chronic and is not exertional.  He was admitted for congestive heart failure (initially was on BiPAP) and cardiology asked to evaluate.  Note his Lasix was recently decreased. Chest x-ray shows cardiomegaly.  Sodium 136, potassium 4.4, BUN 17, creatinine 1.43, troponin I 30 and 34, BNP 1366, hemoglobin 14.8. Electrocardiogram shows atrial fibrillation and poor R wave progression.  1 acute on chronic combined systolic/diastolic congestive heart failure-he is not markedly volume overloaded on examination and feels better in the emergency room after 1 dose of IV Lasix.  We will continue 40 mg IV daily and follow renal function.  Add spironolactone 12.5 mg daily.  Add Jardiance 10 mg daily.  Follow renal function closely.  2 ischemic cardiomyopathy-agree with carvedilol.  We will add losartan 25 mg daily as he apparently has had some difficulties with CHF medications in the past.  Titrate as tolerated.  3 chronic stage IIIa kidney disease-follow renal function closely with diuresis.  4 persistent atrial fibrillation-continue apixaban 5 mg twice daily.  We will change beta-blocker to carvedilol as outlined above.  Will need close follow-up with Dr. Acie Fredrickson.  If there are no plans for cardioversion in the future would likely discontinue amiodarone.  5 prior ICD  6 coronary artery disease status post coronary artery bypass graft-continue statin.  Dyspnea  7 vertigo-Per primary care.  8 chest pain-symptoms seem chronic with no change in frequency or severity.  Troponins not consistent with acute coronary syndrome.  Will not pursue further ischemia evaluation.  [name] Ruths, MD

## 2021-08-27 NOTE — Progress Notes (Signed)
Diet on hold. Po meds due not given, on bipap.

## 2021-08-27 NOTE — ED Notes (Signed)
RT paged. Pt currently 98% on 10L NRB. 87HR afib. Pt is more calm, eyes close.

## 2021-08-27 NOTE — Progress Notes (Signed)
Admission from the ED by bed awake and alert. 

## 2021-08-27 NOTE — ED Notes (Addendum)
Pt on BiPAP, unable to give oral meds.

## 2021-08-27 NOTE — ED Provider Notes (Signed)
Emergency Medicine Provider Triage Evaluation Note  Ryan [name] , a 80 y.o. male  was evaluated in triage.  Pt complains of chest pain  Review of Systems  Positive: Chest pain  Negative: fever  Physical Exam  BP (!) 183/93 (BP Location: Right Arm)   Pulse 63   Temp 97.9 F (36.6 C)   Resp 16   SpO2 93%  Gen:   Awake, no distress   Resp:  Normal effort  MSK:   Moves extremities without difficulty  Other:    Medical Decision Making  Medically screening exam initiated at 11:15 AM.  Appropriate orders placed.  Ryan [name] was informed that the remainder of the evaluation will be completed by another provider, this initial triage assessment does not replace that evaluation, and the importance of remaining in the ED until their evaluation is complete.     Ryan [name] 08/27/21 1115    Ryan Fuse, MD 08/27/21 1430

## 2021-08-27 NOTE — H&P (Signed)
History and Physical    LAWARNCE CHUGH [id number] DOB: 02/16/1941 DOA: 08/27/2021  PCP: Ginger Organ., MD (Confirm with patient/family/NH records and if not entered, this has to be entered at [facility] point of entry) Patient coming from: Home  I have personally briefly reviewed patient's old medical records in [place]  Chief Complaint: SOB, dizziness, trouble swallowing  HPI: Ryan [name] is a 80 y.o. male with medical history significant of HTN, hyperlipidemia, CAD status post CABG and stenting, chronic systolic CHF LVEF 30 to [id number], PVD, PAF on Eliquis, VT status post AICD, DVT on Eliquis, hypothyroidism, presented with multiple complaints.  Patient has significant respiratory distress on BiPAP, with significant trouble speaking, most history provided by patient wife at bedside.  But reported the patient has been having increasing breathing problems for few days, usually happens in the morning when he wakes up, with significant exertional dyspnea.  No chest pains.  And for the last 2 to 3 months, patient developed positional vertigo's, happens every time patient tried to stand up.  Patient was evaluated by " specialist (unsure whether she meant ENT), was told not of vertigo.  But patient was given meclizine as a as needed, which patient took occasionally with little help on his vertigo.  Wife also reported the patient has been having trouble swallowing solid food, " stuck in my throat", symptoms only with solid food, has to use water to wash food down.  Denies any pain with swallowing, he also feels " nausea" frequently but no vomiting.  Denies any diarrhea.  No abdominal pain.  ED Course: Patient was hypoxic, with increasing oxygen demand to 10 L/NRB, switched to BiPAP.  Blood pressure significantly elevated, 1 dose of 60 mg Lasix given, breathing symptoms improved and 2500 mm of urine.  Chest x-ray showed pulmonary congestion.  CTA pending.  Review of Systems: As per HPI  otherwise 14 point review of systems negative.    Past Medical History:  Diagnosis Date   Arthritis    CAD (coronary artery disease)    Carotid artery occlusion    Cataract    Bil eyes/worse in left eye   CHF (congestive heart failure) (HCC)    Chronic back pain    DVT (deep venous thrombosis) (HCC)    Dysrhythmia    Enlarged prostate    takes Rapaflo daily   GERD (gastroesophageal reflux disease)    occasional   History of colon polyps    History of gout    has colchicine prn   History of kidney stones    Hyperlipidemia    takes Crestor daily   Hypertension    takes Amlodipine daily   Hypothyroidism    Myocardial infarction [facility])    Peripheral vascular disease ([place])    Pulmonary emboli ([place]) 03/20/2015   elevated d-dimer, intermediate V/Q study, atypical chest pain and SOB. Start on Xarelto '20mg'$  BID for 3 month   Rapid atrial fibrillation (HCC)    Renal insufficiency    Shortness of breath dyspnea    Urinary frequency    Urinary urgency     Past Surgical History:  Procedure Laterality Date   APPENDECTOMY     BACK SURGERY     5 times   big toe surgery     CARDIAC CATHETERIZATION     2010    dr Acie Fredrickson   cataract surgery     left eye   CHOLECYSTECTOMY N/A 07/27/2016   Procedure: LAPAROSCOPIC CHOLECYSTECTOMY;  Surgeon: Lurena Joiner  Sondra Come, MD;  Location: [place];  Service: General;  Laterality: N/A;   COLONOSCOPY     CORONARY ARTERY BYPASS GRAFT N/A 04/05/2015   Procedure: CORONARY ARTERY BYPASS GRAFTING (CABG)X4 LIMA-LAD; SVG-DIAG1-DIAG2; SVG-PD;  Surgeon: [name], MD;  Location: [place];  Service: Open Heart Surgery;  Laterality: N/A;   CORONARY/GRAFT ANGIOGRAPHY N/A 04/22/2018   Procedure: CORONARY/GRAFT ANGIOGRAPHY;  Surgeon: Martinique, Peter M, MD;  Location: [place] CV LAB;  Service: Cardiovascular;  Laterality: N/A;   CYSTOSCOPY     ENDARTERECTOMY Left 04/24/2016   Procedure: ENDARTERECTOMY LEFT CAROTID;  Surgeon: Mal Misty, MD;  Location: [place];   Service: Vascular;  Laterality: Left;   EYE SURGERY     FEMORAL ARTERY - POPLITEAL ARTERY BYPASS GRAFT     ICD IMPLANT N/A 12/30/2020   Procedure: ICD IMPLANT;  Surgeon: Evans Lance, MD;  Location: [place] CV LAB;  Service: Cardiovascular;  Laterality: N/A;   JOINT REPLACEMENT     shoulder   LEFT HEART CATHETERIZATION WITH CORONARY ANGIOGRAM N/A 04/03/2015   Procedure: LEFT HEART CATHETERIZATION WITH CORONARY ANGIOGRAM;  Surgeon: Troy Sine, MD;  Location: [facility] CATH LAB;  Service: Cardiovascular;  Laterality: N/A;   LUMBAR LAMINECTOMY  01/06/2013   Procedure: MICRODISCECTOMY LUMBAR LAMINECTOMY;  Surgeon: Marybelle Killings, MD;  Location: [place];  Service: Orthopedics;  Laterality: N/A;  L3-4 decompression   LUMBAR LAMINECTOMY/DECOMPRESSION MICRODISCECTOMY  02/12/2012   Procedure: LUMBAR LAMINECTOMY/DECOMPRESSION MICRODISCECTOMY;  Surgeon: Floyce Stakes, MD;  Location: [place] NEURO ORS;  Service: Neurosurgery;  Laterality: N/A;  Lumbar four-five laminectomy   PATCH ANGIOPLASTY Left 04/24/2016   Procedure: LEFT CAROTID ARTERY PATCH ANGIOPLASTY;  Surgeon: Mal Misty, MD;  Location: [place];  Service: Vascular;  Laterality: Left;   RIGHT HEART CATH AND CORONARY/GRAFT ANGIOGRAPHY N/A 01/30/2019   Procedure: RIGHT HEART CATH AND CORONARY/GRAFT ANGIOGRAPHY;  Surgeon: Troy Sine, MD;  Location: [place] CV LAB;  Service: Cardiovascular;  Laterality: N/A;   STERIOD INJECTION Right 01/09/2014   Procedure: STEROID INJECTION;  Surgeon: Mcarthur Rossetti, MD;  Location: [place];  Service: Orthopedics;  Laterality: Right;   TEE WITHOUT CARDIOVERSION N/A 04/05/2015   Procedure: TRANSESOPHAGEAL ECHOCARDIOGRAM (TEE);  Surgeon: [name], MD;  Location: [place];  Service: Open Heart Surgery;  Laterality: N/A;   TOTAL HIP ARTHROPLASTY Left 01/09/2014   DR Ninfa Linden   TOTAL HIP ARTHROPLASTY Left 01/09/2014   Procedure: LEFT TOTAL HIP ARTHROPLASTY ANTERIOR APPROACH and Steroid Injection Right hip;   Surgeon: Mcarthur Rossetti, MD;  Location: [place];  Service: Orthopedics;  Laterality: Left;   TOTAL HIP ARTHROPLASTY Right 08/15/2019   TOTAL HIP ARTHROPLASTY Right 08/15/2019   Procedure: RIGHT TOTAL HIP ARTHROPLASTY ANTERIOR APPROACH;  Surgeon: Mcarthur Rossetti, MD;  Location: [place];  Service: Orthopedics;  Laterality: Right;     reports that he quit smoking about 34 years ago. His smoking use included cigarettes. He quit smokeless tobacco use about 12 years ago.  His smokeless tobacco use included chew. He reports that he does not drink alcohol and does not use drugs.  Allergies  Allergen Reactions   Zetia [Ezetimibe] Other (See Comments)    Myalgias    Codeine Nausea And Vomiting        Unithroid [Levothyroxine Sodium] Other (See Comments)    Caused blurry vision per pt    Family History  Problem Relation Age of Onset   Heart disease Father    Heart attack Father  Heart disease Sister    Hypertension Sister    Heart attack Sister    Hypertension Mother    Diabetes Son      Prior to Admission medications   Medication Sig Start Date End Date Taking? Authorizing Provider  allopurinol (ZYLOPRIM) 100 MG tablet Take 100 mg by mouth daily as needed (gout). 05/29/20  Yes [provider]  amiodarone (PACERONE) 400 MG tablet Take 0.5 tablets (200 mg total) by mouth daily. 07/09/21  Yes Evans Lance, MD  apixaban (ELIQUIS) 5 MG TABS tablet Take 1 tablet (5 mg total) by mouth 2 (two) times daily. 07/09/21  Yes [name], [name], MD  bisoprolol (ZEBETA) 5 MG tablet TAKE 1/2 TABLET(2.5 MG) BY MOUTH DAILY Patient taking differently: Take 2.5 mg by mouth daily. 07/09/21  Yes Evans Lance, MD  Evolocumab (REPATHA SURECLICK) [id number] MG/ML SOAJ Inject 1 pen into the skin every 14 (fourteen) days. 08/30/20  Yes [name], [name], MD  finasteride (PROSCAR) 5 MG tablet Take 5 mg by mouth daily. 08/18/21  Yes [provider]  furosemide (LASIX) 40 MG tablet Take 1 tablet  (40 mg total) by mouth daily. 03/23/21 03/23/22 Yes Mikhail, Velta Addison, DO  isosorbide mononitrate (IMDUR) 30 MG 24 hr tablet Take 1 tablet (30 mg total) by mouth every evening. 07/09/21  Yes Evans Lance, MD  levothyroxine (SYNTHROID) 25 MCG tablet Take 25 mcg by mouth daily. 07/02/21  Yes [provider]  meclizine (ANTIVERT) 25 MG tablet Take 25 mg by mouth every 8 (eight) hours as needed for dizziness. 07/30/21  Yes [provider]  nitroGLYCERIN (NITROSTAT) 0.4 MG SL tablet Place 1 tablet (0.4 mg total) under the tongue every 5 (five) minutes as needed for chest pain. 07/09/21  Yes Evans Lance, MD  potassium chloride SA (KLOR-CON) 20 MEQ tablet Take 1 tablet (20 mEq total) by mouth daily. 07/14/21  Yes [name], [name], MD  tamsulosin (FLOMAX) 0.4 MG CAPS capsule Take 0.4 mg by mouth at bedtime. 03/01/17  Yes [provider]  acetaminophen-codeine (TYLENOL #3) 300-30 MG tablet Take 1 tablet by mouth every 4 (four) hours as needed for moderate pain. Patient not taking: No sig reported 05/14/21   Pete Pelt, PA-C    Physical Exam: Vitals:   08/27/21 1356 08/27/21 1401 08/27/21 1432 08/27/21 1500  BP:  (!) 186/120 (!) 140/93   Pulse: 96 95 94   Resp: (!) 34 (!) 28 (!) 25   Temp:      SpO2: 92% 97% 96% 96%    Constitutional: NAD, calm, comfortable Vitals:   08/27/21 1356 08/27/21 1401 08/27/21 1432 08/27/21 1500  BP:  (!) 186/120 (!) 140/93   Pulse: 96 95 94   Resp: (!) 34 (!) 28 (!) 25   Temp:      SpO2: 92% 97% 96% 96%   Eyes: PERRL, lids and conjunctivae normal ENMT: Mucous membranes are moist. Posterior pharynx clear of any exudate or lesions.Normal dentition.  Neck: normal, supple, no masses, no thyromegaly Respiratory: clear to auscultation bilaterally, no wheezing, fine crackles to bilateral mid levels.  Increasing respiratory effort, talking in broken sentences. No accessory muscle use.  Cardiovascular: Regular rate and rhythm, no murmurs / rubs /  gallops. No extremity edema. 2+ pedal pulses. No carotid bruits.  Abdomen: no tenderness, no masses palpated. No hepatosplenomegaly. Bowel sounds positive.  Musculoskeletal: no clubbing / cyanosis. No joint deformity upper and lower extremities. Good ROM, no contractures. Normal muscle tone.  Skin:  no rashes, lesions, ulcers. No induration Neurologic: CN 2-12 grossly intact. Sensation intact, DTR normal. Strength 5/5 in all 4.  Positive Dix-Hallpike maneuver Psychiatric: Normal judgment and insight. Alert and oriented x 3. Normal mood.     Labs on Admission: I have personally reviewed following labs and imaging studies  CBC: Recent Labs  Lab 08/27/21 1105  WBC 11.1*  HGB 14.8  HCT 46.2  MCV 103.1*  PLT 99991111   Basic Metabolic Panel: Recent Labs  Lab 08/27/21 1105  NA 136  K 4.4  CL 104  CO2 21*  GLUCOSE 129*  BUN 17  CREATININE 1.43*  CALCIUM 9.1   GFR: CrCl cannot be calculated (Unknown ideal weight.). Liver Function Tests: No results for input(s): AST, ALT, ALKPHOS, BILITOT, PROT, ALBUMIN in the last 168 hours. No results for input(s): LIPASE, AMYLASE in the last 168 hours. No results for input(s): AMMONIA in the last 168 hours. Coagulation Profile: No results for input(s): INR, PROTIME in the last 168 hours. Cardiac Enzymes: No results for input(s): CKTOTAL, CKMB, CKMBINDEX, TROPONINI in the last 168 hours. BNP (last 3 results) No results for input(s): PROBNP in the last 8760 hours. HbA1C: No results for input(s): HGBA1C in the last 72 hours. CBG: No results for input(s): GLUCAP in the last 168 hours. Lipid Profile: No results for input(s): CHOL, HDL, LDLCALC, TRIG, CHOLHDL, LDLDIRECT in the last 72 hours. Thyroid Function Tests: No results for input(s): TSH, T4TOTAL, FREET4, T3FREE, THYROIDAB in the last 72 hours. Anemia Panel: No results for input(s): VITAMINB12, FOLATE, FERRITIN, TIBC, IRON, RETICCTPCT in the last 72 hours. Urine analysis:    Component  Value Date/Time   COLORURINE STRAW (A) 03/22/2021 1406   APPEARANCEUR CLEAR 03/22/2021 1406   LABSPEC 1.006 03/22/2021 1406   PHURINE 6.0 03/22/2021 1406   GLUCOSEU NEGATIVE 03/22/2021 1406   HGBUR MODERATE (A) 03/22/2021 1406   BILIRUBINUR NEGATIVE 03/22/2021 1406   [place] 03/22/2021 1406   PROTEINUR NEGATIVE 03/22/2021 1406   UROBILINOGEN 1.0 04/04/2015 2320   NITRITE NEGATIVE 03/22/2021 1406   LEUKOCYTESUR NEGATIVE 03/22/2021 1406    Radiological Exams on Admission: DG Chest 2 View  Result Date: 08/27/2021 CLINICAL DATA:  Chest pain EXAM: CHEST - 2 VIEW COMPARISON:  Chest radiograph 03/22/2021 FINDINGS: A left chest wall cardiac device and single associated lead are stable. Median sternotomy wires and mediastinal surgical clips are again noted. The heart is at the upper limits of normal for size, unchanged. Lung volumes are low. Patchy opacities in the lateral left base likely reflect atelectasis or scar. There is no new focal airspace disease. There is no significant pleural effusion. There is no pneumothorax. Left shoulder hardware is again noted. There is degenerative change of the right shoulder. There is no acute osseous abnormality. Upper abdominal surgical clips are again noted. IMPRESSION: Stable borderline cardiomegaly. No radiographic evidence of acute cardiopulmonary process. Electronically Signed   By: Valetta Mole M.D.   On: 08/27/2021 11:47    EKG: Independently reviewed.  A. fib with prolonged QTC's.  Assessment/Plan Active Problems:   Chronic combined systolic and diastolic CHF (congestive heart failure) ([place])  (please populate well all problems here in Problem List. (For example, if patient is on BP meds at home and you resume or decide to hold them, it is a problem that needs to be her. Same for CAD, COPD, HLD and so on)  Acute hypoxic respite failure -Secondary to acute on chronic systolic CHF decompensation, and uncontrolled hypertension. -Changed once a  day bisoprolol 2 twice daily Coreg for fine-tuning BP control, titrate according to response.  Add as needed hydralazine, continue once daily IV Lasix 40 mg. -Wean off BIPAP  Uncontrolled hypertension -As above.  Vertigo -Positive Hallpike maneuver, suspect peripheral vertigo/BPV. -Continue meclizine. -Patient on Eliquis, considered to be less likely central vertigo given the history showing symptoms related to head positioning, and positive Hallpike maneuver on physical exam. -CT head  Acute on chronic dysphagia -Denies any history of choking, no aspiration on chest x-ray. -Speech evaluation.  Dyspepsia -As needed Zofran, outpatient GI follow-up.  BPH with acute urinary retention -Foley was placed in ED, expect a Foley stain for at least 3 days, and then void trial inpatient versus outpatient. -Finasteride and Flomax.  History of VT status post AICD -No acute issue -Change beta-blocker to  Coreg as above. -Cardiology consulted in ED.  PAF -Rate controlled, continue Eliquis  CKD stage II -Stable, diuresis as above.  DVT prophylaxis: Eliquis Code Status: Full code Family Communication: Wife at bedside Disposition Plan: Expect more than 2 midnight hospital stay to treat CHF, wean off oxygen, PT evaluation. Consults called: Cardiology Admission status: PCU   Lequita Halt MD [facility] Pager [phone]  08/27/2021, 3:48 PM

## 2021-08-27 NOTE — ED Notes (Addendum)
This RN remains in the room, coaching pt to breathe slowly and try to relax, pt repositioned.

## 2021-08-27 NOTE — ED Notes (Signed)
Pt called, said he couldnt breathe. Oxygen saturation 88% on 3L but pt is a mouth breather. NRB placed, pt is now 94% but still very anxious. ECG done. Messaged Dr. Almyra Free.

## 2021-08-27 NOTE — Telephone Encounter (Signed)
Spoke with pt who reports that he feels that he is going to die.  Per pt he is having CP and can not breathe.  I advised pt to call 911.  His wife is home with him I spoke with her and also advised her to call 911.  Pt and wife both agreeable to call 911.

## 2021-08-27 NOTE — ED Triage Notes (Addendum)
Pt to triage via [facility].  Reports pain to center of chest since last night that radiates to bilateral shoulder blades.  Woke up at 6am with SOB and dizziness.  Afib with history of same.  Pain 5/10.  Pain down to 2/10 with 2 NTG.  20 g LFA.  No aspirin given.  Placed on 1 liter [place] by EMS for sats 92% on room air.

## 2021-08-27 NOTE — Telephone Encounter (Signed)
Called back to check on pt.  Wife reports that he is being admitted to the hospital and thanked me for calling to check up on him.

## 2021-08-28 ENCOUNTER — Inpatient Hospital Stay (HOSPITAL_COMMUNITY): Payer: Medicare Other

## 2021-08-28 ENCOUNTER — Encounter (HOSPITAL_COMMUNITY): Payer: Self-pay | Admitting: Internal Medicine

## 2021-08-28 DIAGNOSIS — I5021 Acute systolic (congestive) heart failure: Secondary | ICD-10-CM | POA: Diagnosis not present

## 2021-08-28 DIAGNOSIS — I5043 Acute on chronic combined systolic (congestive) and diastolic (congestive) heart failure: Secondary | ICD-10-CM | POA: Diagnosis not present

## 2021-08-28 DIAGNOSIS — I5042 Chronic combined systolic (congestive) and diastolic (congestive) heart failure: Secondary | ICD-10-CM | POA: Diagnosis not present

## 2021-08-28 LAB — URINALYSIS, ROUTINE W REFLEX MICROSCOPIC
Bilirubin Urine: NEGATIVE
Glucose, UA: NEGATIVE mg/dL
Hgb urine dipstick: NEGATIVE
Ketones, ur: NEGATIVE mg/dL
Leukocytes,Ua: NEGATIVE
Nitrite: NEGATIVE
Protein, ur: NEGATIVE mg/dL
Specific Gravity, Urine: 1.01 (ref 1.005–1.030)
pH: 6 (ref 5.0–8.0)

## 2021-08-28 LAB — ECHOCARDIOGRAM COMPLETE
AR max vel: 0.8 cm2
AV Area VTI: 0.87 cm2
AV Area mean vel: 0.79 cm2
AV Mean grad: 6.5 mmHg
AV Peak grad: 11.8 mmHg
Ao pk vel: 1.72 m/s
Calc EF: 55.8 %
Height: 66 in
S' Lateral: 3.3 cm
Single Plane A2C EF: 52.2 %
Single Plane A4C EF: 59.6 %
Weight: 2469.15 oz

## 2021-08-28 LAB — BASIC METABOLIC PANEL
Anion gap: 10 (ref 5–15)
BUN: 20 mg/dL (ref 8–23)
CO2: 22 mmol/L (ref 22–32)
Calcium: 8.8 mg/dL — ABNORMAL LOW (ref 8.9–10.3)
Chloride: 107 mmol/L (ref 98–111)
Creatinine, Ser: 1.51 mg/dL — ABNORMAL HIGH (ref 0.61–1.24)
GFR, Estimated: 46 mL/min — ABNORMAL LOW (ref >60)
Glucose, Bld: 111 mg/dL — ABNORMAL HIGH (ref 70–99)
Potassium: 4.5 mmol/L (ref 3.5–5.1)
Sodium: 139 mmol/L (ref 135–145)

## 2021-08-28 LAB — MRSA NEXT GEN BY PCR, NASAL: MRSA by PCR Next Gen: NOT DETECTED

## 2021-08-28 MED ORDER — ENSURE ENLIVE PO LIQD
237.0000 mL | Freq: Three times a day (TID) | ORAL | Status: DC
  Administered 2021-08-28 (×2): 237 mL via ORAL

## 2021-08-28 MED ORDER — APIXABAN 2.5 MG PO TABS
2.5000 mg | ORAL_TABLET | Freq: Two times a day (BID) | ORAL | Status: DC
  Administered 2021-08-28 – 2021-08-29 (×3): 2.5 mg via ORAL
  Filled 2021-08-28 (×3): qty 1

## 2021-08-28 MED ORDER — CHLORHEXIDINE GLUCONATE CLOTH 2 % EX PADS
6.0000 | MEDICATED_PAD | Freq: Every day | CUTANEOUS | Status: DC
  Administered 2021-08-28: 6 via TOPICAL

## 2021-08-28 MED ORDER — IPRATROPIUM-ALBUTEROL 0.5-2.5 (3) MG/3ML IN SOLN: 3.0000 mL | Freq: Four times a day (QID) | RESPIRATORY_TRACT | Status: DC | PRN

## 2021-08-28 MED ORDER — PERFLUTREN LIPID MICROSPHERE
1.0000 mL | INTRAVENOUS | Status: AC | PRN
  Administered 2021-08-28: 3 mL via INTRAVENOUS
  Filled 2021-08-28: qty 10

## 2021-08-28 MED ORDER — ADULT MULTIVITAMIN W/MINERALS CH
1.0000 | ORAL_TABLET | Freq: Every day | ORAL | Status: DC
  Administered 2021-08-28 – 2021-08-29 (×2): 1 via ORAL
  Filled 2021-08-28 (×2): qty 1

## 2021-08-28 NOTE — Progress Notes (Signed)
Progress Note  Patient Name: Ryan [name] Date of Encounter: 08/28/2021  [facility] Cardiologist: Mertie Moores, MD   Subjective   No CP; dyspnea improving  Inpatient Medications    Scheduled Meds:  amiodarone  200 mg Oral Daily   apixaban  5 mg Oral BID   carvedilol  3.125 mg Oral BID WC   Chlorhexidine Gluconate Cloth  6 each Topical Daily   empagliflozin  10 mg Oral Daily   finasteride  5 mg Oral Daily   furosemide  40 mg Intravenous Daily   guaiFENesin  1,200 mg Oral BID   ipratropium-albuterol  3 mL Nebulization BID   levothyroxine  25 mcg Oral Daily   losartan  25 mg Oral Daily   sodium chloride flush  3 mL Intravenous Q12H   spironolactone  12.5 mg Oral Daily   tamsulosin  0.4 mg Oral QHS   Continuous Infusions:  sodium chloride     PRN Meds: sodium chloride, acetaminophen, allopurinol, hydrALAZINE, meclizine, ondansetron (ZOFRAN) IV, sodium chloride flush   Vital Signs    Vitals:   08/28/21 0350 08/28/21 0400 08/28/21 0429 08/28/21 0753  BP:  103/66    Pulse:  (!) 58  64  Resp:  13  16  Temp:  (!) 97.1 F (36.2 C)    TempSrc:  Axillary    SpO2: 98% 99%  98%  Weight:   70 kg   Height:        Intake/Output Summary (Last 24 hours) at 08/28/2021 0807 Last data filed at 08/28/2021 0400 Gross per 24 hour  Intake --  Output 925 ml  Net -925 ml   Last 3 Weights 08/28/2021 08/27/2021 08/27/2021  Weight (lbs) 154 lb 5.2 oz 153 lb 148 lb  Weight (kg) 70 kg 69.4 kg 67.132 kg      Telemetry    Atrial fibrillation - Personally Reviewed  Physical Exam   GEN: No acute distress.   Neck: No JVD Cardiac: irregular Respiratory: diminished BS bases GI: Soft, nontender, non-distended  MS: No edema Neuro:  Nonfocal  Psych: Normal affect   Labs    High Sensitivity Troponin:   Recent Labs  Lab 08/27/21 1105 08/27/21 1247  TROPONINIHS 30* 34*      Chemistry Recent Labs  Lab 08/27/21 1105 08/28/21 0046  NA 136 139  K 4.4 4.5  CL 104 107   CO2 21* 22  GLUCOSE 129* 111*  BUN 17 20  CREATININE 1.43* 1.51*  CALCIUM 9.1 8.8*  GFRNONAA 50* 46*  ANIONGAP 11 10     Hematology Recent Labs  Lab 08/27/21 1105  WBC 11.1*  RBC 4.48  HGB 14.8  HCT 46.2  MCV 103.1*  MCH 33.0  MCHC 32.0  RDW 13.7  PLT 257    BNP Recent Labs  Lab 08/27/21 1246  BNP 1,366.1*     DDimer  Recent Labs  Lab 08/27/21 1247  DDIMER 2.05*     Radiology    DG Chest 2 View  Result Date: 08/27/2021 CLINICAL DATA:  Chest pain EXAM: CHEST - 2 VIEW COMPARISON:  Chest radiograph 03/22/2021 FINDINGS: A left chest wall cardiac device and single associated lead are stable. Median sternotomy wires and mediastinal surgical clips are again noted. The heart is at the upper limits of normal for size, unchanged. Lung volumes are low. Patchy opacities in the lateral left base likely reflect atelectasis or scar. There is no new focal airspace disease. There is no significant pleural effusion. There is  no pneumothorax. Left shoulder hardware is again noted. There is degenerative change of the right shoulder. There is no acute osseous abnormality. Upper abdominal surgical clips are again noted. IMPRESSION: Stable borderline cardiomegaly. No radiographic evidence of acute cardiopulmonary process. Electronically Signed   By: Valetta Mole M.D.   On: 08/27/2021 11:47   CT HEAD WO CONTRAST (5MM)  Result Date: 08/27/2021 CLINICAL DATA:  Vertigo, peripheral. EXAM: CT HEAD WITHOUT CONTRAST TECHNIQUE: Contiguous axial images were obtained from the base of the skull through the vertex without intravenous contrast. COMPARISON:  None. FINDINGS: Brain: No evidence of acute infarction, hemorrhage, hydrocephalus, extra-axial collection or mass lesion/mass effect. There is mild diffuse atrophy. There is mild periventricular white matter hypodensity, likely chronic small vessel ischemic change. Vascular: There are atherosclerotic calcifications of the intracranial internal carotid  arteries. Skull: Normal. Negative for fracture or focal lesion. Sinuses/Orbits: No acute finding. Other: None. IMPRESSION: 1. No acute intracranial abnormality. 2. Mild diffuse atrophy.  Mild chronic small vessel ischemic change. Electronically Signed   By: Ronney Asters M.D.   On: 08/27/2021 17:16   CT Angio Chest PE W and/or Wo Contrast  Result Date: 08/27/2021 CLINICAL DATA:  PE suspected, low/intermediate probability, positive D-dimer EXAM: CT ANGIOGRAPHY CHEST WITH CONTRAST TECHNIQUE: Multidetector CT imaging of the chest was performed using the standard protocol during bolus administration of intravenous contrast. Multiplanar CT image reconstructions and MIPs were obtained to evaluate the vascular anatomy. CONTRAST:  64m OMNIPAQUE IOHEXOL 350 MG/ML SOLN COMPARISON:  2019 FINDINGS: Cardiovascular: Satisfactory opacification of the pulmonary arteries to the segmental level. No evidence of pulmonary embolism. Normal heart size. Coronary artery calcifications. No pericardial effusion. Thoracic aorta is normal in caliber with calcified plaque. Mediastinum/Nodes: No enlarged lymph nodes. Thyroid and esophagus are unremarkable. Lungs/Pleura: Small to moderate pleural effusions bilaterally with adjacent compressive atelectasis. Patchy ground-glass density with small consolidative opacities. No pneumothorax. Upper Abdomen: No acute abnormality. Musculoskeletal: Degenerative changes of the included spine. Post sternotomy. Left shoulder arthroplasty with associated streak artifact. Left chest wall ICD. Review of the MIP images confirms the above findings. IMPRESSION: No evidence of acute pulmonary embolism. Small to moderate bilateral pleural effusions with adjacent atelectasis. Patchy ground-glass density with small consolidative opacities may reflect edema. Multifocal pneumonia is possible in the appropriate setting. Coronary artery calcification. Electronically Signed   By: PMacy MisM.D.   On: 08/27/2021  17:06     Patient Profile     80year old male with coronary artery disease status post coronary bypass and graft, ischemic cardiomyopathy, chronic systolic congestive heart failure, prior ICD, persistent atrial fibrillation, prior CVA, chronic stage IIIa kidney disease for evaluation of acute on chronic combined systolic/diastolic congestive heart failure.  Most recent echocardiogram January 2022 showed ejection fraction 35 to 40%, moderate left atrial enlargement.    Assessment & Plan    1 acute on chronic combined systolic/diastolic congestive heart failure-symptoms much improved today.  We will continue Lasix 40 mg IV daily today and likely transition to oral Lasix tomorrow.  Continue low-dose spironolactone and Jardiance.  Follow renal function closely.   2 ischemic cardiomyopathy-continue low-dose carvedilol and losartan and follow blood pressure.  These medications can be titrated as an outpatient.  Await follow-up echocardiogram.   3 chronic stage IIIa kidney disease-follow renal function closely with diuresis.   4 persistent atrial fibrillation-change apixaban to 2.5 mg twice daily as creatinine 1.51 and patient 80years of age.  Continue low-dose carvedilol.  Will need close follow-up with Dr. NAcie Fredrickson  If  there are no plans for cardioversion in the future would likely discontinue amiodarone.   5 prior ICD   6 coronary artery disease status post coronary artery bypass graft-continue statin.     7 vertigo-Per primary care.   8 chest pain-no recurrences.  Troponins not consistent with acute coronary syndrome.  No plans for further ischemia evaluation.  9 hypothyroidism-Per primary care.  Ambulate today, wean O2 and potential discharge tomorrow morning if stable.  For questions or updates, please contact [place] Please consult [email or web address] for contact info under        Signed, [name] Ruths, MD  08/28/2021, 8:07 AM

## 2021-08-28 NOTE — Progress Notes (Signed)
  Echocardiogram 2D Echocardiogram has been performed.  Ryan [name] M 08/28/2021, 3:13 PM

## 2021-08-28 NOTE — Evaluation (Signed)
Clinical/Bedside Swallow Evaluation Patient Details  Name: MAGIC KUHAR MRN: [id number] Date of Birth: [birth year]  Today's Date: 08/28/2021 Time: SLP Start Time (ACUTE ONLY): 56 SLP Stop Time (ACUTE ONLY): 1215 SLP Time Calculation (min) (ACUTE ONLY): 22 min  Past Medical History:  Past Medical History:  Diagnosis Date   Arthritis    CAD (coronary artery disease)    Carotid artery occlusion    Cataract    Bil eyes/worse in left eye   CHF (congestive heart failure) (HCC)    Chronic back pain    DVT (deep venous thrombosis) (HCC)    Dysrhythmia    Enlarged prostate    takes Rapaflo daily   GERD (gastroesophageal reflux disease)    occasional   History of colon polyps    History of gout    has colchicine prn   History of kidney stones    Hyperlipidemia    takes Crestor daily   Hypertension    takes Amlodipine daily   Hypothyroidism    Myocardial infarction [facility])    Peripheral vascular disease ([place])    Pulmonary emboli ([place]) 03/20/2015   elevated d-dimer, intermediate V/Q study, atypical chest pain and SOB. Start on Xarelto '20mg'$  BID for 3 month   Rapid atrial fibrillation ([place])    Renal insufficiency    Shortness of breath dyspnea    Urinary frequency    Urinary urgency    Past Surgical History:  Past Surgical History:  Procedure Laterality Date   APPENDECTOMY     BACK SURGERY     5 times   big toe surgery     CARDIAC CATHETERIZATION     2010    dr Acie Fredrickson   cataract surgery     left eye   CHOLECYSTECTOMY N/A 07/27/2016   Procedure: LAPAROSCOPIC CHOLECYSTECTOMY;  Surgeon: Mickeal Skinner, MD;  Location: [place];  Service: General;  Laterality: N/A;   COLONOSCOPY     CORONARY ARTERY BYPASS GRAFT N/A 04/05/2015   Procedure: CORONARY ARTERY BYPASS GRAFTING (CABG)X4 LIMA-LAD; SVG-DIAG1-DIAG2; SVG-PD;  Surgeon: [name], MD;  Location: [place];  Service: Open Heart Surgery;  Laterality: N/A;   CORONARY/GRAFT ANGIOGRAPHY N/A 04/22/2018   Procedure:  CORONARY/GRAFT ANGIOGRAPHY;  Surgeon: Martinique, Peter M, MD;  Location: [place] CV LAB;  Service: Cardiovascular;  Laterality: N/A;   CYSTOSCOPY     ENDARTERECTOMY Left 04/24/2016   Procedure: ENDARTERECTOMY LEFT CAROTID;  Surgeon: Mal Misty, MD;  Location: [place];  Service: Vascular;  Laterality: Left;   EYE SURGERY     FEMORAL ARTERY - POPLITEAL ARTERY BYPASS GRAFT     ICD IMPLANT N/A 12/30/2020   Procedure: ICD IMPLANT;  Surgeon: Evans Lance, MD;  Location: [place] CV LAB;  Service: Cardiovascular;  Laterality: N/A;   JOINT REPLACEMENT     shoulder   LEFT HEART CATHETERIZATION WITH CORONARY ANGIOGRAM N/A 04/03/2015   Procedure: LEFT HEART CATHETERIZATION WITH CORONARY ANGIOGRAM;  Surgeon: Troy Sine, MD;  Location: [facility] CATH LAB;  Service: Cardiovascular;  Laterality: N/A;   LUMBAR LAMINECTOMY  01/06/2013   Procedure: MICRODISCECTOMY LUMBAR LAMINECTOMY;  Surgeon: Marybelle Killings, MD;  Location: [place];  Service: Orthopedics;  Laterality: N/A;  L3-4 decompression   LUMBAR LAMINECTOMY/DECOMPRESSION MICRODISCECTOMY  02/12/2012   Procedure: LUMBAR LAMINECTOMY/DECOMPRESSION MICRODISCECTOMY;  Surgeon: Floyce Stakes, MD;  Location: [place] NEURO OR[facility];  Service: Neurosurgery;  Laterality: N/A;  Lumbar four-five laminectomy   PATCH ANGIOPLASTY Left 04/24/2016   Procedure: LEFT CAROTID ARTERY PATCH ANGIOPLASTY;  Surgeon: Mal Misty, MD;  Location: [place];  Service: Vascular;  Laterality: Left;   RIGHT HEART CATH AND CORONARY/GRAFT ANGIOGRAPHY N/A 01/30/2019   Procedure: RIGHT HEART CATH AND CORONARY/GRAFT ANGIOGRAPHY;  Surgeon: Troy Sine, MD;  Location: [place] CV LAB;  Service: Cardiovascular;  Laterality: N/A;   STERIOD INJECTION Right 01/09/2014   Procedure: STEROID INJECTION;  Surgeon: Mcarthur Rossetti, MD;  Location: [place];  Service: Orthopedics;  Laterality: Right;   TEE WITHOUT CARDIOVERSION N/A 04/05/2015   Procedure: TRANSESOPHAGEAL ECHOCARDIOGRAM (TEE);  Surgeon: [name], MD;  Location: [place];  Service: Open Heart Surgery;  Laterality: N/A;   TOTAL HIP ARTHROPLASTY Left 01/09/2014   DR Ninfa Linden   TOTAL HIP ARTHROPLASTY Left 01/09/2014   Procedure: LEFT TOTAL HIP ARTHROPLASTY ANTERIOR APPROACH and Steroid Injection Right hip;  Surgeon: Mcarthur Rossetti, MD;  Location: [place];  Service: Orthopedics;  Laterality: Left;   TOTAL HIP ARTHROPLASTY Right 08/15/2019   TOTAL HIP ARTHROPLASTY Right 08/15/2019   Procedure: RIGHT TOTAL HIP ARTHROPLASTY ANTERIOR APPROACH;  Surgeon: Mcarthur Rossetti, MD;  Location: [place];  Service: Orthopedics;  Laterality: Right;   HPI:  80 y.o. male admitted with acute hypoxic respiratory failure, uncontrolled HTN, and reports of solid food dysphagia.  PMHx HTN, hyperlipidemia, CAD status post CABG and stenting, chronic systolic CHF LVEF 30 to [id number], PVD, PAF on Eliquis, VT status post AICD, DVT on Eliquis, hypothyroidism, GERD. Upon discussion, pt states he has occasional episodes when a pill or a piece of solid food lodges temporarily in his throat, cleared easily with liquid wash.  Frequency is <once per week.   Assessment / Plan / Recommendation Clinical Impression  Pt presents with functional swallowing with no s/s of an oropharyngeal dysphagia.  He demonstrated normal mastication, the appearance of a brisk swallow response, no s/s of aspiration, good coordination of swallow/respiratory cycles, no c/o of globus or eosphageal issues.  Recommend continuing regular solids, thin liquids; crush meds when preferred by pt.  No SLP f/u is warranted. D/W pt and RN. SLP Visit Diagnosis: Dysphagia, unspecified (R13.10)    Aspiration Risk  No limitations    Diet Recommendation   Regular solids, thin liquids  Medication Administration: Whole meds with liquid    Other  Recommendations Oral Care Recommendations: Oral care BID   Follow up Recommendations None        Swallow Study   General HPI: 80 y.o. male admitted with  acute hypoxic respiratory failure, uncontrolled HTN, and reports of solid food dysphagia.  PMHx HTN, hyperlipidemia, CAD status post CABG and stenting, chronic systolic CHF LVEF 30 to [id number], PVD, PAF on Eliquis, VT status post AICD, DVT on Eliquis, hypothyroidism, GERD. Upon discussion, pt states he has occasional episodes when a pill or a piece of solid food lodges temporarily in his throat, cleared easily with liquid wash.  Frequency is <once per week. Type of Study: Bedside Swallow Evaluation Diet Prior to this Study: Regular;Thin liquids Temperature Spikes Noted: No Respiratory Status: Room air History of Recent Intubation: No Behavior/Cognition: Alert Oral Cavity Assessment: Within Functional Limits Oral Care Completed by SLP: No Oral Cavity - Dentition: Missing dentition (upper dentures not placed) Vision: Functional for self-feeding Self-Feeding Abilities: Able to feed self Patient Positioning: Upright in chair Baseline Vocal Quality: Normal Volitional Cough: Strong Volitional Swallow: Able to elicit    Oral/Motor/Sensory Function Overall Oral Motor/Sensory Function: Within functional limits   Ice Chips Ice chips: Within functional limits   Thin  Liquid Thin Liquid: Within functional limits    Nectar Thick Nectar Thick Liquid: Not tested   Honey Thick Honey Thick Liquid: Not tested   Puree Puree: Within functional limits   Solid     Solid: Within functional limits      Juan Quam Laurice 08/28/2021,12:34 PM  Estill Bamberg L. Tivis Ringer, [place] Office number [phone] Pager [phone]

## 2021-08-28 NOTE — Progress Notes (Addendum)
Initial Nutrition Assessment  DOCUMENTATION CODES:   Non-severe (moderate) malnutrition in context of chronic illness  INTERVENTION:   Encourage good PO intake   Ensure Enlive (Chocolate) - TID       -Provides 350 kcal & 20 gm PRO each   Multivitamin - Daily   Recommend liberalizing the diet to increase PO intake  NUTRITION DIAGNOSIS:    Moderate Malnutrition related to chronic illness (CHF) as evidenced by moderate fat depletion, moderate muscle depletion, energy intake < 75% for > or equal to 1 month.   GOAL:   Patient will meet greater than or equal to 90% of their needs   MONITOR:   PO intake, Supplement acceptance  REASON FOR ASSESSMENT:   Malnutrition Screening Tool - 5    ASSESSMENT:   Pt admitted for SOB, dizziness, and trouble swallowing. PMH of HTN, CHF, CAD, and GERD.  Patient resting in bed with wife at bedside. Pt reports that he does not have an appetite; he stated that he "just don't want it". He included that if they go out to eat at [facility], orders a steak, potato, and salad; he maybe will eat half of it and then is full. Reports that he has started to drink 1 Boost/ day, his daughter is a Marine scientist at [employer] and told him that he needed to start drinking them again. Pt reports that he uses no assistance to ambulate at home; walks his driveway once per day. Pt reports that he does have difficulty swallowing; reports that solids and pills feel like they are caught and then drinks water and they are gone. (SLP to see him today, 09/08). Pt reports that he lost from 192# to 147# (23% considering weight is accurate, not EMR record) last year after some hospitalization, since then he has not had an appetite and has been steady weight between 147-151#.   Discussed ONS with pt, pt expressed if he could have more than 1 per day. Pt agreeable to Ensure -TID. Discussed with pt that it is important that he tries to eat food and not rely on ONS. Discussed that pt should  try to eat his meal first and then drink Ensure after he tries to eat.    Medications reviewed: Jardiance, Eliquis, Lasix, Levothyroxine Labs reviewed: Creatinine 1.51   NUTRITION - FOCUSED PHYSICAL EXAM:  Flowsheet Row Most Recent Value  Orbital Region Moderate depletion  Upper Arm Region Moderate depletion  Thoracic and Lumbar Region Moderate depletion  Buccal Region Moderate depletion  Temple Region Moderate depletion  Clavicle Bone Region Moderate depletion  Clavicle and Acromion Bone Region Moderate depletion  Scapular Bone Region Moderate depletion  Dorsal Hand Moderate depletion  Patellar Region Moderate depletion  Anterior Thigh Region Moderate depletion  Posterior Calf Region Moderate depletion  Edema (RD Assessment) None  Hair Reviewed  Eyes Reviewed  Mouth Reviewed  Skin Reviewed  Nails Reviewed       Diet Order:   Diet Order             Diet Heart Room service appropriate? Yes; Fluid consistency: Thin  Diet effective now                   EDUCATION NEEDS:   No education needs have been identified at this time  Skin:  Skin Assessment: Reviewed RN Assessment  Last BM:  09/07  Height:   Ht Readings from Last 1 Encounters:  08/27/21 '5\' 6"'$  (1.676 m)    Weight:   Wt  Readings from Last 1 Encounters:  08/28/21 70 kg    Ideal Body Weight:  64.5 kg  BMI:  Body mass index is 24.91 kg/m.  Estimated Nutritional Needs:   Kcal:  2000-2200  Protein:  100-120 gm  Fluid:  2 L    Koa Palla BS, PLDN Clinical Dietitian See [facility] for contact information.

## 2021-08-28 NOTE — Progress Notes (Signed)
Passed swallowing evaluation by speech therapist.  All po meds given  and lunch served tolerated well.

## 2021-08-28 NOTE — Progress Notes (Signed)
Heart Failure Nurse Navigator Progress Note  Attempted to assess patient for HV TOC readiness. Pt currently getting ECHO at bedside. Will evaluate at a later time.   Pricilla Holm, MSN, RN Heart Failure Nurse Navigator [phone]

## 2021-08-28 NOTE — Progress Notes (Addendum)
PROGRESS NOTE  Ryan [name] [id number] DOB: 1941-06-06 DOA: 08/27/2021 PCP: Ginger Organ., MD  HPI/Recap of past 24 hours: Ryan [name] is a 80 y.o. male with medical history significant of HTN, hyperlipidemia, CAD status post CABG and stenting, chronic systolic CHF LVEF 30 to [id number], PVD, PAF on Eliquis, VT status post AICD, DVT on Eliquis, hypothyroidism, presented with multiple complaints.  Patient has significant respiratory distress on BiPAP, with significant trouble speaking, most history provided by patient wife at bedside.   But reported the patient has been having increasing breathing problems for few days, usually happens in the morning when he wakes up, with significant exertional dyspnea.  No chest pains.  And for the last 2 to 3 months, patient developed positional vertigo's, happens every time patient tried to stand up.  Patient was evaluated by " specialist (unsure whether she meant ENT), was told not of vertigo.  But patient was given meclizine as a as needed, which patient took occasionally with little help on his vertigo.   Wife also reported the patient has been having trouble swallowing solid food, " stuck in my throat", symptoms only with solid food, has to use water to wash food down.  Denies any pain with swallowing, he also feels " nausea" frequently but no vomiting.  Denies any diarrhea.  No abdominal pain.   ED Course: Patient was hypoxic, with increasing oxygen demand to 10 L/NRB, switched to BiPAP.  Blood pressure significantly elevated, 1 dose of 60 mg Lasix given, breathing symptoms improved and 2500 mm of urine.  Chest x-ray showed pulmonary congestion.  CTA negative for PE but positive for mild pulmonary edema.  08/28/2021: Patient was seen and examined at his bedside.  He reports his dyspnea is improved with diuresing.  Denies any chest pain at the time of this visit.  Seen by cardiology.  Appreciate assistance.   Assessment/Plan: Active Problems:    Chronic combined systolic and diastolic CHF (congestive heart failure) (HCC)  Improving acute hypoxic respiratory failure secondary to pulmonary edema in the setting of acute on chronic diastolic CHF. -Secondary to acute on chronic systolic CHF decompensation, and uncontrolled hypertension. -Changed once a day bisoprolol 2 twice daily Coreg for fine-tuning BP control, titrate according to response.  Add as needed hydralazine, continue once daily IV Lasix 40 mg. -Wean off BIPAP Weaning off oxygen supplementation, currently on 2 L with O2 saturation in the mid 90s. Normal oxygen supplementation at baseline. Continue to monitor O2 saturation Continue to maintain O2 saturation greater than 92%.  Acute on chronic diastolic CHF 2D echo done on 28 Apr 2021 revealed normal LVEF 55 to 55%, the left ventricle demonstrate regional wall motion abnormalities.  Basal anterior/anteroseptal hypokinesis.  Severe asymmetric left ventricular hypertrophy of the basal septal segment. Ongoing diuresing Continue strict I's and O's and daily weight Net I&O -1.3 L Cardiology following  Hypertension BP is at goal.   Vertigo -Positive Hallpike maneuver, suspect peripheral vertigo/BPV. -Continue meclizine. -Patient on Eliquis, considered to be less likely central vertigo given the history showing symptoms related to head positioning, and positive Hallpike maneuver on physical exam. -CT head   Acute on chronic dysphagia -Self reports dysphagia with solids. Speech therapist consulted. Aspiration precautions.   Dyspepsia -As needed Zofran, outpatient GI follow-up.   BPH with acute urinary retention -Foley was placed in ED, expect a Foley stain for at least 3 days, and then void trial inpatient versus outpatient. -Finasteride and Flomax.   History of  VT status post AICD -No acute issue -Change beta-blocker to  Coreg as above. -Cardiology consulted in ED.   PAF -Rate controlled, continue Eliquis   CKD  stage II -Stable, diuresis as above.   DVT prophylaxis: Eliquis Code Status: Full code Family Communication: Wife at bedside Disposition Plan: Expect more than 2 midnight hospital stay to treat CHF, wean off oxygen, PT evaluation. Consults called: Cardiology Admission status: PCU        Objective: Vitals:   08/28/21 0727 08/28/21 0753 08/28/21 1137 08/28/21 1551  BP: 131/69  (!) 152/62 115/61  Pulse: 62 64 67 60  Resp: '20 16 18   '$ Temp: 98.3 F (36.8 C)  98.4 F (36.9 C) 98.2 F (36.8 C)  TempSrc: Oral  Oral Oral  SpO2: 100% 98% 92%   Weight:      Height:        Intake/Output Summary (Last 24 hours) at 08/28/2021 1901 Last data filed at 08/28/2021 1641 Gross per 24 hour  Intake 437 ml  Output 1775 ml  Net -1338 ml   Filed Weights   08/27/21 1742 08/27/21 1822 08/28/21 0429  Weight: 67.1 kg 69.4 kg 70 kg    Exam:  General: 80 y.o. year-old male well developed well nourished in no acute distress.  Alert and oriented x3. Cardiovascular: Regular rate and rhythm with no rubs or gallops.  No thyromegaly or JVD noted.   Respiratory: Mild rales at bases.  No wheezing noted.  Good respiratory effort.   Abdomen: Soft nontender nondistended with normal bowel sounds x4 quadrants. Musculoskeletal: No lower extremity edema. 2/4 pulses in all 4 extremities. Skin: No ulcerative lesions noted or rashes, Psychiatry: Mood is appropriate for condition and setting   Data Reviewed: CBC: Recent Labs  Lab 08/27/21 1105  WBC 11.1*  HGB 14.8  HCT 46.2  MCV 103.1*  PLT 99991111   Basic Metabolic Panel: Recent Labs  Lab 08/27/21 1105 08/28/21 0046  NA 136 139  K 4.4 4.5  CL 104 107  CO2 21* 22  GLUCOSE 129* 111*  BUN 17 20  CREATININE 1.43* 1.51*  CALCIUM 9.1 8.8*   GFR: Estimated Creatinine Clearance: 35.2 mL/min (A) (by C-G formula based on SCr of 1.51 mg/dL (H)). Liver Function Tests: No results for input(s): AST, ALT, ALKPHOS, BILITOT, PROT, ALBUMIN in the last 168  hours. No results for input(s): LIPASE, AMYLASE in the last 168 hours. No results for input(s): AMMONIA in the last 168 hours. Coagulation Profile: No results for input(s): INR, PROTIME in the last 168 hours. Cardiac Enzymes: No results for input(s): CKTOTAL, CKMB, CKMBINDEX, TROPONINI in the last 168 hours. BNP (last 3 results) No results for input(s): PROBNP in the last 8760 hours. HbA1C: No results for input(s): HGBA1C in the last 72 hours. CBG: No results for input(s): GLUCAP in the last 168 hours. Lipid Profile: No results for input(s): CHOL, HDL, LDLCALC, TRIG, CHOLHDL, LDLDIRECT in the last 72 hours. Thyroid Function Tests: Recent Labs    08/27/21 1934  TSH 11.949*   Anemia Panel: No results for input(s): VITAMINB12, FOLATE, FERRITIN, TIBC, IRON, RETICCTPCT in the last 72 hours. Urine analysis:    Component Value Date/Time   COLORURINE YELLOW 08/27/2021 [place] 08/27/2021 1259   LABSPEC 1.010 08/27/2021 1259   PHURINE 6.0 08/27/2021 1259   GLUCOSEU NEGATIVE 08/27/2021 1259   HGBUR NEGATIVE 08/27/2021 1259   BILIRUBINUR NEGATIVE 08/27/2021 [place] 08/27/2021 1259   PROTEINUR NEGATIVE 08/27/2021 1259  UROBILINOGEN 1.0 04/04/2015 2320   NITRITE NEGATIVE 08/27/2021 1259   LEUKOCYTESUR NEGATIVE 08/27/2021 1259   Sepsis Labs: '@LABRCNTIP'$ (procalcitonin:4,lacticidven:4)  ) Recent Results (from the past 240 hour(s))  Resp Panel by RT-PCR (Flu A&B, Covid) Nasopharyngeal Swab     Status: None   Collection Time: 08/27/21  1:22 PM   Specimen: Nasopharyngeal Swab; Nasopharyngeal(NP) swabs in vial transport medium  Result Value Ref Range Status   SARS Coronavirus 2 by RT PCR NEGATIVE NEGATIVE Final    Comment: (NOTE) SARS-CoV-2 target nucleic acids are NOT DETECTED.  The SARS-CoV-2 RNA is generally detectable in upper respiratory specimens during the acute phase of infection. The lowest concentration of SARS-CoV-2 viral copies this  assay can detect is 138 copies/mL. A negative result does not preclude SARS-Cov-2 infection and should not be used as the sole basis for treatment or other patient management decisions. A negative result may occur with  improper specimen collection/handling, submission of specimen other than nasopharyngeal swab, presence of viral mutation(s) within the areas targeted by this assay, and inadequate number of viral copies(<138 copies/mL). A negative result must be combined with clinical observations, patient history, and epidemiological information. The expected result is Negative.  Fact Sheet for Patients:  EntrepreneurPulse.com.au  Fact Sheet for Healthcare Providers:  IncredibleEmployment.be  This test is no t yet approved or cleared by the [place] FDA and  has been authorized for detection and/or diagnosis of SARS-CoV-2 by FDA under an Emergency Use Authorization (EUA). This EUA will remain  in effect (meaning this test can be used) for the duration of the COVID-19 declaration under Section 564(b)(1) of the Act, 21 U.S.C.section 360bbb-3(b)(1), unless the authorization is terminated  or revoked sooner.       Influenza A by PCR NEGATIVE NEGATIVE Final   Influenza B by PCR NEGATIVE NEGATIVE Final    Comment: (NOTE) The Xpert Xpress SARS-CoV-2/FLU/RSV plus assay is intended as an aid in the diagnosis of influenza from Nasopharyngeal swab specimens and should not be used as a sole basis for treatment. Nasal washings and aspirates are unacceptable for Xpert Xpress SARS-CoV-2/FLU/RSV testing.  Fact Sheet for Patients: EntrepreneurPulse.com.au  Fact Sheet for Healthcare Providers: IncredibleEmployment.be  This test is not yet approved or cleared by the [place] FDA and has been authorized for detection and/or diagnosis of SARS-CoV-2 by FDA under an Emergency Use Authorization (EUA). This EUA will  remain in effect (meaning this test can be used) for the duration of the COVID-19 declaration under Section 564(b)(1) of the Act, 21 U.S.C. section 360bbb-3(b)(1), unless the authorization is terminated or revoked.  Performed at [facility], [address], [place], [place] [address]       Studies: ECHOCARDIOGRAM COMPLETE  Result Date: 08/28/2021    ECHOCARDIOGRAM REPORT   Patient Name:   KESHAUN DERIENZO Date of Exam: 08/28/2021 Medical Rec #:  [id number]         Height:       66.0 in Accession #:    [id number]        Weight:       154.3 lb Date of Birth:  09-22-1941         BSA:          1.791 m Patient Age:    64 years          BP:           152/62 mmHg Patient Gender: M  HR:           57 bpm. Exam Location:  Inpatient Procedure: 2D Echo, Cardiac Doppler, Color Doppler and Intracardiac            Opacification Agent Indications:    CHF-Acute Systolic [id number]  History:        Patient has prior history of Echocardiogram examinations, most                 recent 12/28/2020. CHF, CAD and Previous Myocardial Infarction,                 Pacemaker, Carotid Disease and PAD, Arrythmias:Atrial                 Fibrillation, Signs/Symptoms:Shortness of Breath; Risk                 Factors:Hypertension and Dyslipidemia. GERD.  Sonographer:    Darlina Sicilian RDCS Referring Phys: [place]  1. Left ventricular ejection fraction, by estimation, is 50 to 55%. The left ventricle has low normal function. The left ventricle demonstrates regional wall motion abnormalities. Basal anterior/anteroseptal hypokinesis. There is severe asymmetric left ventricular hypertrophy of the basal-septal segment. Left ventricular diastolic parameters are indeterminate.  2. Right ventricular systolic function is moderately reduced. The right ventricular size is normal. There is normal pulmonary artery systolic pressure.  3. The mitral valve is normal in structure. Trivial mitral valve  regurgitation. No evidence of mitral stenosis.  4. The inferior vena cava is normal in size with greater than 50% respiratory variability, suggesting right atrial pressure of 3 mmHg.  5. The aortic valve is calcified. There is moderate calcification of the aortic valve. Aortic valve regurgitation is not visualized. Moderate aortic valve stenosis. Mean gradient only 47mHg, but AVA 1.0 cm^2 and DI 0.35, suspect paradoxical low flow low  gradient moderate AS given low SV index (21cc/m^2)  6. Small mobile echodensity adjacent to RV pacemaker lead (seen on image 14), could represent fibrinous strand or small adherent thrombus; if clinical concern for infection would check blood cultures FINDINGS  Left Ventricle: Left ventricular ejection fraction, by estimation, is 50 to 55%. The left ventricle has low normal function. The left ventricle demonstrates regional wall motion abnormalities. Definity contrast agent was given IV to delineate the left ventricular endocardial borders. The left ventricular internal cavity size was small. There is severe asymmetric left ventricular hypertrophy of the basal-septal segment. Left ventricular diastolic parameters are indeterminate. Right Ventricle: The right ventricular size is normal. Right vetricular wall thickness was not well visualized. Right ventricular systolic function is moderately reduced. There is normal pulmonary artery systolic pressure. The tricuspid regurgitant velocity is 2.14 m/s, and with an assumed right atrial pressure of 3 mmHg, the estimated right ventricular systolic pressure is 2Q000111QmmHg. Left Atrium: Left atrial size was normal in size. Right Atrium: Right atrial size was normal in size. Pericardium: Trivial pericardial effusion is present. Mitral Valve: The mitral valve is normal in structure. Trivial mitral valve regurgitation. No evidence of mitral valve stenosis. Tricuspid Valve: The tricuspid valve is normal in structure. Tricuspid valve regurgitation is  trivial. Aortic Valve: The aortic valve is calcified. There is moderate calcification of the aortic valve. Aortic valve regurgitation is not visualized. Moderate aortic stenosis is present. Aortic valve mean gradient measures 6.5 mmHg. Aortic valve peak gradient measures 11.8 mmHg. Aortic valve area, by VTI measures 0.87 cm. Pulmonic Valve: The pulmonic valve was not well visualized. Pulmonic valve regurgitation is not  visualized. Aorta: The aortic root is normal in size and structure. Venous: The inferior vena cava is normal in size with greater than 50% respiratory variability, suggesting right atrial pressure of 3 mmHg. IAS/Shunts: The interatrial septum was not well visualized.  LEFT VENTRICLE PLAX 2D LVIDd:         4.15 cm LVIDs:         3.30 cm LV PW:         1.15 cm LV IVS:        1.15 cm LVOT diam:     1.90 cm LV SV:         34 LV SV Index:   19 LVOT Area:     2.84 cm  LV Volumes (MOD) LV vol d, MOD A2C: 80.1 ml LV vol d, MOD A4C: 74.1 ml LV vol s, MOD A2C: 38.3 ml LV vol s, MOD A4C: 29.9 ml LV SV MOD A2C:     41.8 ml LV SV MOD A4C:     74.1 ml LV SV MOD BP:      44.4 ml RIGHT VENTRICLE RV S prime:     5.43 cm/s LEFT ATRIUM             Index       RIGHT ATRIUM           Index LA diam:        4.00 cm 2.23 cm/m  RA Area:     15.30 cm LA Vol (A2C):   40.5 ml 22.61 ml/m RA Volume:   35.80 ml  19.99 ml/m LA Vol (A4C):   45.5 ml 25.40 ml/m LA Biplane Vol: 44.6 ml 24.90 ml/m  AORTIC VALVE AV Area (Vmax):    0.80 cm AV Area (Vmean):   0.79 cm AV Area (VTI):     0.87 cm AV Vmax:           171.75 cm/s AV Vmean:          120.250 cm/s AV VTI:            0.388 m AV Peak Grad:      11.8 mmHg AV Mean Grad:      6.5 mmHg LVOT Vmax:         48.75 cm/s LVOT Vmean:        33.300 cm/s LVOT VTI:          0.119 m LVOT/AV VTI ratio: 0.31  AORTA Ao Root diam: 3.20 cm TRICUSPID VALVE TR Peak grad:   18.3 mmHg TR Vmax:        214.00 cm/s  SHUNTS Systemic VTI:  0.12 m Systemic Diam: 1.90 cm Oswaldo Milian MD  Electronically signed by Oswaldo Milian MD Signature Date/Time: 08/28/2021/5:32:57 PM    Final     Scheduled Meds:  amiodarone  200 mg Oral Daily   apixaban  2.5 mg Oral BID   carvedilol  3.125 mg Oral BID WC   Chlorhexidine Gluconate Cloth  6 each Topical Daily   empagliflozin  10 mg Oral Daily   feeding supplement  237 mL Oral TID BM   finasteride  5 mg Oral Daily   furosemide  40 mg Intravenous Daily   guaiFENesin  1,200 mg Oral BID   levothyroxine  25 mcg Oral Daily   losartan  25 mg Oral Daily   multivitamin with minerals  1 tablet Oral Daily   sodium chloride flush  3 mL Intravenous Q12H   spironolactone  12.5 mg Oral Daily  tamsulosin  0.4 mg Oral QHS    Continuous Infusions:  sodium chloride       LOS: 1 day     Kayleen Memos, MD [facility] Pager [phone]  If 7PM-7AM, please contact night-coverage [email or web address] Password [email or web address] 08/28/2021, 7:01 PM

## 2021-08-28 NOTE — Evaluation (Signed)
Physical Therapy Evaluation & Discharge Patient Details Name: Ryan [name] MRN: [id number] DOB: [birth year] Today's Date: 08/28/2021   History of Present Illness  Pt is an 80 y.o. male admitted 08/27/21 with c/o SOB, dizziness, trouble swallowing. CXR with pulmonary congestion. Workup for acute hypoxic respiratory failure secondary to CHF decompensation, uncontrolled HTN. PMH includes HTN, CHF, PAF, CKD 2, VT s/p AICD, DVT on Eliquis, CABG, vertigo (on meclizine PRN), bilateral THA.   Clinical Impression  Patient evaluated by Physical Therapy with no further acute PT needs identified. PTA, pt independent, retired from work, active, lives with supportive wife. Today, pt independent with mobility; denies dizziness or SOB with activity; HR 60s. Educ re: activity recommendations, fall risk reduction. All education has been completed and the patient has no further questions. Acute PT is signing off. Thank you for this referral.  Orthostatic BPs Supine 125/61  Sitting 114/64  Standing 120/67  Post-ambulation 152/62   SATURATION QUALIFICATIONS:  Patient Saturations on Room Air at Rest = 92% Patient Saturations on Room Air while Ambulating = 89%     Follow Up Recommendations No PT follow up    Equipment Recommendations  None recommended by PT    Recommendations for Other Services       Precautions / Restrictions Precautions Precautions: None Restrictions Weight Bearing Restrictions: No      Mobility  Bed Mobility Overal bed mobility: Independent                  Transfers Overall transfer level: Independent Equipment used: None                Ambulation/Gait Ambulation/Gait assistance: Independent Gait Distance (Feet): 300 Feet Assistive device: None Gait Pattern/deviations: Step-through pattern;Decreased stride length Gait velocity: Decreased   General Gait Details: Slow, steady, guarded gait independent without DME; pt briefly down to 89% on RA,  maintaining ~92% on RA  Stairs            Wheelchair Mobility    Modified Rankin (Stroke Patients Only)       Balance Overall balance assessment: No apparent balance deficits (not formally assessed)                                           Pertinent Vitals/Pain Pain Assessment: No/denies pain    Home Living Family/patient expects to be discharged to:: Private residence Living Arrangements: Spouse/significant other Available Help at Discharge: Family;Available 24 hours/day Type of Home: House Home Access: Stairs to enter Entrance Stairs-Rails: Right Entrance Stairs-Number of Steps: 2 Home Layout: Two level;Able to live on main level with bedroom/bathroom Home Equipment: Gilford Rile - 2 wheels;Cane - single point;Walker - 4 wheels      Prior Function Level of Independence: Independent         Comments: Independent without DME, drives; 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 Enjoys time with his dog     Hand Dominance        Extremity/Trunk Assessment   Upper Extremity Assessment Upper Extremity Assessment: Overall WFL for tasks assessed    Lower Extremity Assessment Lower Extremity Assessment: Overall WFL for tasks assessed (reports chronic bilateral hip issues; has had bilateral THA)       Communication   Communication: HOH  Cognition Arousal/Alertness: Awake/alert Behavior During Therapy: WFL for tasks assessed/performed Overall Cognitive Status:  Within Functional Limits for tasks assessed                                        General Comments General comments (skin integrity, edema, etc.): Wife present and supportive. Orthostatic hypotension negative. Pt denies dizziness throughout session, no evidence of vertigo    Exercises     Assessment/Plan    PT Assessment Patent does not need any further PT services  PT Problem List         PT Treatment  Interventions      PT Goals (Current goals can be found in the Care Plan section)  Acute Rehab PT Goals PT Goal Formulation: All assessment and education complete, DC therapy    Frequency     Barriers to discharge        Co-evaluation               AM-PAC PT "6 Clicks" Mobility  Outcome Measure Help needed turning from your back to your side while in a flat bed without using bedrails?: None Help needed moving from lying on your back to sitting on the side of a flat bed without using bedrails?: None Help needed moving to and from a bed to a chair (including a wheelchair)?: None Help needed standing up from a chair using your arms (e.g., wheelchair or bedside chair)?: None Help needed to walk in hospital room?: None Help needed climbing 3-5 steps with a railing? : None 6 Click Score: 24    End of Session Equipment Utilized During Treatment: Gait belt Activity Tolerance: Patient tolerated treatment well Patient left: in chair;with call bell/phone within reach;with family/visitor present;with nursing/sitter in room Nurse Communication: Mobility status PT Visit Diagnosis: Other abnormalities of gait and mobility (R26.89)    Time: [id number] PT Time Calculation (min) (ACUTE ONLY): 20 min   Charges:   PT Evaluation $PT Eval Low Complexity: [place], PT, DPT Acute Rehabilitation Services  Pager [phone] Office [place] 08/28/2021, 12:36 PM

## 2021-08-29 DIAGNOSIS — I5043 Acute on chronic combined systolic (congestive) and diastolic (congestive) heart failure: Secondary | ICD-10-CM | POA: Diagnosis not present

## 2021-08-29 DIAGNOSIS — I5042 Chronic combined systolic (congestive) and diastolic (congestive) heart failure: Secondary | ICD-10-CM | POA: Diagnosis not present

## 2021-08-29 LAB — BASIC METABOLIC PANEL
Anion gap: 11 (ref 5–15)
BUN: 31 mg/dL — ABNORMAL HIGH (ref 8–23)
CO2: 23 mmol/L (ref 22–32)
Calcium: 8.9 mg/dL (ref 8.9–10.3)
Chloride: 104 mmol/L (ref 98–111)
Creatinine, Ser: 1.42 mg/dL — ABNORMAL HIGH (ref 0.61–1.24)
GFR, Estimated: 50 mL/min — ABNORMAL LOW (ref >60)
Glucose, Bld: 93 mg/dL (ref 70–99)
Potassium: 3.9 mmol/L (ref 3.5–5.1)
Sodium: 138 mmol/L (ref 135–145)

## 2021-08-29 MED ORDER — FUROSEMIDE 20 MG PO TABS: 20.0000 mg | ORAL_TABLET | Freq: Every day | ORAL | Status: DC

## 2021-08-29 MED ORDER — CARVEDILOL 3.125 MG PO TABS: 3.1250 mg | ORAL_TABLET | Freq: Two times a day (BID) | ORAL | 0 refills | Status: DC

## 2021-08-29 MED ORDER — ADULT MULTIVITAMIN W/MINERALS CH: 1.0000 | ORAL_TABLET | Freq: Every day | ORAL | 0 refills | Status: AC

## 2021-08-29 MED ORDER — EMPAGLIFLOZIN 10 MG PO TABS: 10.0000 mg | ORAL_TABLET | Freq: Every day | ORAL | 0 refills | Status: DC

## 2021-08-29 MED ORDER — FUROSEMIDE 20 MG PO TABS: 20.0000 mg | ORAL_TABLET | Freq: Every day | ORAL | 0 refills | Status: DC

## 2021-08-29 MED ORDER — APIXABAN 2.5 MG PO TABS: 2.5000 mg | ORAL_TABLET | Freq: Two times a day (BID) | ORAL | 0 refills | Status: DC

## 2021-08-29 MED ORDER — SPIRONOLACTONE 25 MG PO TABS: 12.5000 mg | ORAL_TABLET | Freq: Every day | ORAL | 0 refills | Status: DC

## 2021-08-29 NOTE — Discharge Instructions (Signed)
Wear compression stockings during the day and remove at night Stand up slowly USE THE WALKER!

## 2021-08-29 NOTE — Discharge Summary (Addendum)
Discharge Summary  Ryan [name] [id number] DOB: Sep 13, 1941  PCP: Ginger Organ., MD  Admit date: 08/27/2021 Discharge date: 08/29/2021  Time spent: 35 minutes  Recommendations for Outpatient Follow-up:  Follow-up with cardiology Follow-up with your PCP Take your medications as prescribed  Discharge Diagnoses:  Active Hospital Problems   Diagnosis Date Noted   Chronic combined systolic and diastolic CHF (congestive heart failure) [facility])     Resolved Hospital Problems  No resolved problems to display.    Discharge Condition: Stable  Diet recommendation: Heart healthy diet.  Vitals:   08/29/21 0751 08/29/21 1155  BP:  106/70  Pulse: 78 61  Resp:  16  Temp:  98.2 F (36.8 C)  SpO2:  97%    History of present illness:   Ryan [name] is a 80 y.o. male with medical history significant of HTN, hyperlipidemia, CAD status post CABG and stenting, chronic systolic CHF LVEF 30 to [id number], PVD, PAF on Eliquis, VT status post AICD, DVT on Eliquis, hypothyroidism, presented with multiple complaints.  Patient has significant respiratory distress on BiPAP, with significant trouble speaking, most history provided by patient wife at bedside.   But reported the patient has been having increasing breathing problems for few days, usually happens in the morning when he wakes up, with significant exertional dyspnea.  No chest pains.  And for the last 2 to 3 months, patient developed positional vertigo's, happens every time patient tried to stand up.  Patient was evaluated by " specialist (unsure whether she meant ENT), was told not of vertigo.  But patient was given meclizine as a as needed, which patient took occasionally with little help on his vertigo.   Wife also reported the patient has been having trouble swallowing solid food, " stuck in my throat", symptoms only with solid food, has to use water to wash food down.  Denies any pain with swallowing, he also feels " nausea"  frequently but no vomiting.  Denies any diarrhea.  No abdominal pain.   ED Course: Patient was hypoxic, with increasing oxygen demand to 10 L/NRB, switched to BiPAP.  Blood pressure significantly elevated, 1 dose of 60 mg Lasix given, breathing symptoms improved and 2500 mm of urine.  Chest x-ray showed pulmonary congestion.  CTA negative for PE but positive for mild pulmonary edema.   08/29/2021: Breathing is much improved, wean off oxygen.  O2 saturation 97% on room air.  He has no new complaints.  Okay to discharge from a cardiology standpoint.  He was seen by PT with no further recommendations.  Patient advised to take his medications as prescribed and to follow-up with his cardiologist post hospitalization.  He understands and agrees with the plan.  Hospital Course:  Active Problems:   Chronic combined systolic and diastolic CHF (congestive heart failure) (HCC)  Resolved acute hypoxic respiratory failure secondary to pulmonary edema in the setting of acute on chronic diastolic CHF. Initially requiring BiPAP in the ED, was diuresed with improvement of respiratory status. Weaned off oxygen supplementation Currently on room air with O2 saturation 97%.   Acute on chronic diastolic CHF 2D echo done on 08/28/2021 revealed normal LVEF 50 to 55%, the left ventricle demonstrate regional wall motion abnormalities.  Basal anterior/anteroseptal hypokinesis.  Severe asymmetric left ventricular hypertrophy of the basal septal segment. Tolerated diuresing well. Net I&O -2.4 L Home medications adjusted by cardiology.   Hypertension BP is at goal.   Vertigo Was assessed by PT with no further recommendations. -Continue meclizine  as needed.   Acute on chronic dysphagia Seen by speech therapist with recommendation for regular solids and thin liquids.  Whole meds with liquid.   BPH with acute urinary retention Continue finasteride and Flomax. -Finasteride and Flomax.   History of VT status post  AICD -No acute issue -Change beta-blocker to  Coreg as above. -Cardiology consulted in ED.   Persistent A. fib -Rate controlled, continue Eliquis   CKD stage II -Stable    DVT prophylaxis: Eliquis  Code Status: Full code        Procedures: 2D echo on 08/28/2021.  Consultations: Cardiology.  Discharge Exam: BP 106/70 (BP Location: Left Arm)   Pulse 61   Temp 98.2 F (36.8 C) (Oral)   Resp 16   Ht '5\' 6"'$  (1.676 m)   Wt 66.5 kg   SpO2 97%   BMI 23.66 kg/m  General: 80 y.o. year-old male well developed well nourished in no acute distress.  Alert and oriented x3. Cardiovascular: Irregular rate and rhythm with no rubs or gallops.  No thyromegaly or JVD noted.   Respiratory: Clear to auscultation with no wheezes or rales. Good inspiratory effort. Abdomen: Soft nontender nondistended with normal bowel sounds x4 quadrants. Musculoskeletal: No lower extremity edema. 2/4 pulses in all 4 extremities. Skin: No ulcerative lesions noted or rashes, Psychiatry: Mood is appropriate for condition and setting  Discharge Instructions You were cared for by a hospitalist during your hospital stay. If you have any questions about your discharge medications or the care you received while you were in the hospital after you are discharged, you can call the unit and asked to speak with the hospitalist on call if the hospitalist that took care of you is not available. Once you are discharged, your primary care physician will handle any further medical issues. Please note that NO REFILLS for any discharge medications will be authorized once you are discharged, as it is imperative that you return to your primary care physician (or establish a relationship with a primary care physician if you do not have one) for your aftercare needs so that they can reassess your need for medications and monitor your lab values.   Allergies as of 08/29/2021       Reactions   Zetia [ezetimibe] Other (See Comments)    Myalgias    Codeine Nausea And Vomiting       Unithroid [levothyroxine Sodium] Other (See Comments)   Caused blurry vision per pt        Medication List     STOP taking these medications    acetaminophen-codeine 300-30 MG tablet Commonly known as: TYLENOL #3   bisoprolol 5 MG tablet Commonly known as: ZEBETA   isosorbide mononitrate 30 MG 24 hr tablet Commonly known as: IMDUR   potassium chloride SA 20 MEQ tablet Commonly known as: KLOR-CON       TAKE these medications    allopurinol 100 MG tablet Commonly known as: ZYLOPRIM Take 100 mg by mouth daily as needed (gout).   amiodarone 400 MG tablet Commonly known as: PACERONE Take 0.5 tablets (200 mg total) by mouth daily.   apixaban 2.5 MG Tabs tablet Commonly known as: ELIQUIS Take 1 tablet (2.5 mg total) by mouth 2 (two) times daily. What changed:  medication strength how much to take   carvedilol 3.125 MG tablet Commonly known as: COREG Take 1 tablet (3.125 mg total) by mouth 2 (two) times daily with a meal.   empagliflozin 10 MG Tabs tablet Commonly known  as: JARDIANCE Take 1 tablet (10 mg total) by mouth daily. Start taking on: August 30, 2021   finasteride 5 MG tablet Commonly known as: PROSCAR Take 5 mg by mouth daily.   furosemide 20 MG tablet Commonly known as: LASIX Take 1 tablet (20 mg total) by mouth daily. Start taking on: August 30, 2021 What changed:  medication strength how much to take   levothyroxine 25 MCG tablet Commonly known as: SYNTHROID Take 25 mcg by mouth daily.   meclizine 25 MG tablet Commonly known as: ANTIVERT Take 25 mg by mouth every 8 (eight) hours as needed for dizziness.   multivitamin with minerals Tabs tablet Take 1 tablet by mouth daily. Start taking on: August 30, 2021   nitroGLYCERIN 0.4 MG SL tablet Commonly known as: NITROSTAT Place 1 tablet (0.4 mg total) under the tongue every 5 (five) minutes as needed for chest pain.   Repatha  SureClick [id number] MG/ML Soaj Generic drug: Evolocumab Inject 1 pen into the skin every 14 (fourteen) days.   spironolactone 25 MG tablet Commonly known as: ALDACTONE Take 0.5 tablets (12.5 mg total) by mouth daily. Start taking on: August 30, 2021   tamsulosin 0.4 MG Caps capsule Commonly known as: FLOMAX Take 0.4 mg by mouth at bedtime.       Allergies  Allergen Reactions   Zetia [Ezetimibe] Other (See Comments)    Myalgias    Codeine Nausea And Vomiting        Unithroid [Levothyroxine Sodium] Other (See Comments)    Caused blurry vision per pt    Follow-up Information     Ginger Organ., MD. Call today.   Specialty: Internal Medicine Why: Please call for a posthospital follow-up appointment. Contact information: [place] [address] [phone]         [name], [name], MD .   Specialty: Cardiology Contact information: [place] [address] [phone]                  The results of significant diagnostics from this hospitalization (including imaging, microbiology, ancillary and laboratory) are listed below for reference.    Significant Diagnostic Studies: DG Chest 2 View  Result Date: 08/27/2021 CLINICAL DATA:  Chest pain EXAM: CHEST - 2 VIEW COMPARISON:  Chest radiograph 03/22/2021 FINDINGS: A left chest wall cardiac device and single associated lead are stable. Median sternotomy wires and mediastinal surgical clips are again noted. The heart is at the upper limits of normal for size, unchanged. Lung volumes are low. Patchy opacities in the lateral left base likely reflect atelectasis or scar. There is no new focal airspace disease. There is no significant pleural effusion. There is no pneumothorax. Left shoulder hardware is again noted. There is degenerative change of the right shoulder. There is no acute osseous abnormality. Upper abdominal surgical clips are again noted. IMPRESSION: Stable  borderline cardiomegaly. No radiographic evidence of acute cardiopulmonary process. Electronically Signed   By: Valetta Mole M.D.   On: 08/27/2021 11:47   CT HEAD WO CONTRAST (5MM)  Result Date: 08/27/2021 CLINICAL DATA:  Vertigo, peripheral. EXAM: CT HEAD WITHOUT CONTRAST TECHNIQUE: Contiguous axial images were obtained from the base of the skull through the vertex without intravenous contrast. COMPARISON:  None. FINDINGS: Brain: No evidence of acute infarction, hemorrhage, hydrocephalus, extra-axial collection or mass lesion/mass effect. There is mild diffuse atrophy. There is mild periventricular white matter hypodensity, likely chronic small vessel ischemic change. Vascular: There are atherosclerotic calcifications  of the intracranial internal carotid arteries. Skull: Normal. Negative for fracture or focal lesion. Sinuses/Orbits: No acute finding. Other: None. IMPRESSION: 1. No acute intracranial abnormality. 2. Mild diffuse atrophy.  Mild chronic small vessel ischemic change. Electronically Signed   By: Ronney Asters M.D.   On: 08/27/2021 17:16   CT Angio Chest PE W and/or Wo Contrast  Result Date: 08/27/2021 CLINICAL DATA:  PE suspected, low/intermediate probability, positive D-dimer EXAM: CT ANGIOGRAPHY CHEST WITH CONTRAST TECHNIQUE: Multidetector CT imaging of the chest was performed using the standard protocol during bolus administration of intravenous contrast. Multiplanar CT image reconstructions and MIPs were obtained to evaluate the vascular anatomy. CONTRAST:  64m OMNIPAQUE IOHEXOL 350 MG/ML SOLN COMPARISON:  2019 FINDINGS: Cardiovascular: Satisfactory opacification of the pulmonary arteries to the segmental level. No evidence of pulmonary embolism. Normal heart size. Coronary artery calcifications. No pericardial effusion. Thoracic aorta is normal in caliber with calcified plaque. Mediastinum/Nodes: No enlarged lymph nodes. Thyroid and esophagus are unremarkable. Lungs/Pleura: Small to moderate  pleural effusions bilaterally with adjacent compressive atelectasis. Patchy ground-glass density with small consolidative opacities. No pneumothorax. Upper Abdomen: No acute abnormality. Musculoskeletal: Degenerative changes of the included spine. Post sternotomy. Left shoulder arthroplasty with associated streak artifact. Left chest wall ICD. Review of the MIP images confirms the above findings. IMPRESSION: No evidence of acute pulmonary embolism. Small to moderate bilateral pleural effusions with adjacent atelectasis. Patchy ground-glass density with small consolidative opacities may reflect edema. Multifocal pneumonia is possible in the appropriate setting. Coronary artery calcification. Electronically Signed   By: PMacy MisM.D.   On: 08/27/2021 17:06   ECHOCARDIOGRAM COMPLETE  Result Date: 08/28/2021    ECHOCARDIOGRAM REPORT   Patient Name:   CANTOINO STAPFDate of Exam: 08/28/2021 Medical Rec #:  [id number]        Height:       66.0 in Accession #:    [id number]       Weight:       154.3 lb Date of Birth:  809/23/1942        BSA:          1.791 m Patient Age:    89years          BP:           152/62 mmHg Patient Gender: M                 HR:           57 bpm. Exam Location:  Inpatient Procedure: 2D Echo, Cardiac Doppler, Color Doppler and Intracardiac            Opacification Agent Indications:    CHF-Acute Systolic [id number] History:        Patient has prior history of Echocardiogram examinations, most                 recent 12/28/2020. CHF, CAD and Previous Myocardial Infarction,                 Pacemaker, Carotid Disease and PAD, Arrythmias:Atrial                 Fibrillation, Signs/Symptoms:Shortness of Breath; Risk                 Factors:Hypertension and Dyslipidemia. GERD.  Sonographer:    TDarlina SicilianRDCS Referring Phys: 2[place] 1. Left ventricular ejection fraction, by estimation, is 50 to 55%. The left ventricle has low normal  function. The left ventricle  demonstrates regional wall motion abnormalities. Basal anterior/anteroseptal hypokinesis. There is severe asymmetric left ventricular hypertrophy of the basal-septal segment. Left ventricular diastolic parameters are indeterminate.  2. Right ventricular systolic function is moderately reduced. The right ventricular size is normal. There is normal pulmonary artery systolic pressure.  3. The mitral valve is normal in structure. Trivial mitral valve regurgitation. No evidence of mitral stenosis.  4. The inferior vena cava is normal in size with greater than 50% respiratory variability, suggesting right atrial pressure of 3 mmHg.  5. The aortic valve is calcified. There is moderate calcification of the aortic valve. Aortic valve regurgitation is not visualized. Moderate aortic valve stenosis. Mean gradient only 69mHg, but AVA 1.0 cm^2 and DI 0.35, suspect paradoxical low flow low  gradient moderate AS given low SV index (21cc/m^2)  6. Small mobile echodensity adjacent to RV pacemaker lead (seen on image 14), could represent fibrinous strand or small adherent thrombus; if clinical concern for infection would check blood cultures FINDINGS  Left Ventricle: Left ventricular ejection fraction, by estimation, is 50 to 55%. The left ventricle has low normal function. The left ventricle demonstrates regional wall motion abnormalities. Definity contrast agent was given IV to delineate the left ventricular endocardial borders. The left ventricular internal cavity size was small. There is severe asymmetric left ventricular hypertrophy of the basal-septal segment. Left ventricular diastolic parameters are indeterminate. Right Ventricle: The right ventricular size is normal. Right vetricular wall thickness was not well visualized. Right ventricular systolic function is moderately reduced. There is normal pulmonary artery systolic pressure. The tricuspid regurgitant velocity is 2.14 m/s, and with an assumed right atrial pressure of 3  mmHg, the estimated right ventricular systolic pressure is 2Q000111QmmHg. Left Atrium: Left atrial size was normal in size. Right Atrium: Right atrial size was normal in size. Pericardium: Trivial pericardial effusion is present. Mitral Valve: The mitral valve is normal in structure. Trivial mitral valve regurgitation. No evidence of mitral valve stenosis. Tricuspid Valve: The tricuspid valve is normal in structure. Tricuspid valve regurgitation is trivial. Aortic Valve: The aortic valve is calcified. There is moderate calcification of the aortic valve. Aortic valve regurgitation is not visualized. Moderate aortic stenosis is present. Aortic valve mean gradient measures 6.5 mmHg. Aortic valve peak gradient measures 11.8 mmHg. Aortic valve area, by VTI measures 0.87 cm. Pulmonic Valve: The pulmonic valve was not well visualized. Pulmonic valve regurgitation is not visualized. Aorta: The aortic root is normal in size and structure. Venous: The inferior vena cava is normal in size with greater than 50% respiratory variability, suggesting right atrial pressure of 3 mmHg. IAS/Shunts: The interatrial septum was not well visualized.  LEFT VENTRICLE PLAX 2D LVIDd:         4.15 cm LVIDs:         3.30 cm LV PW:         1.15 cm LV IVS:        1.15 cm LVOT diam:     1.90 cm LV SV:         34 LV SV Index:   19 LVOT Area:     2.84 cm  LV Volumes (MOD) LV vol d, MOD A2C: 80.1 ml LV vol d, MOD A4C: 74.1 ml LV vol s, MOD A2C: 38.3 ml LV vol s, MOD A4C: 29.9 ml LV SV MOD A2C:     41.8 ml LV SV MOD A4C:     74.1 ml LV SV MOD BP:  44.4 ml RIGHT VENTRICLE RV S prime:     5.43 cm/s LEFT ATRIUM             Index       RIGHT ATRIUM           Index LA diam:        4.00 cm 2.23 cm/m  RA Area:     15.30 cm LA Vol (A2C):   40.5 ml 22.61 ml/m RA Volume:   35.80 ml  19.99 ml/m LA Vol (A4C):   45.5 ml 25.40 ml/m LA Biplane Vol: 44.6 ml 24.90 ml/m  AORTIC VALVE AV Area (Vmax):    0.80 cm AV Area (Vmean):   0.79 cm AV Area (VTI):     0.87  cm AV Vmax:           171.75 cm/s AV Vmean:          120.250 cm/s AV VTI:            0.388 m AV Peak Grad:      11.8 mmHg AV Mean Grad:      6.5 mmHg LVOT Vmax:         48.75 cm/s LVOT Vmean:        33.300 cm/s LVOT VTI:          0.119 m LVOT/AV VTI ratio: 0.31  AORTA Ao Root diam: 3.20 cm TRICUSPID VALVE TR Peak grad:   18.3 mmHg TR Vmax:        214.00 cm/s  SHUNTS Systemic VTI:  0.12 m Systemic Diam: 1.90 cm Oswaldo Milian MD Electronically signed by Oswaldo Milian MD Signature Date/Time: 08/28/2021/5:32:57 PM    Final     Microbiology: Recent Results (from the past 240 hour(s))  Resp Panel by RT-PCR (Flu A&B, Covid) Nasopharyngeal Swab     Status: None   Collection Time: 08/27/21  1:22 PM   Specimen: Nasopharyngeal Swab; Nasopharyngeal(NP) swabs in vial transport medium  Result Value Ref Range Status   SARS Coronavirus 2 by RT PCR NEGATIVE NEGATIVE Final    Comment: (NOTE) SARS-CoV-2 target nucleic acids are NOT DETECTED.  The SARS-CoV-2 RNA is generally detectable in upper respiratory specimens during the acute phase of infection. The lowest concentration of SARS-CoV-2 viral copies this assay can detect is 138 copies/mL. A negative result does not preclude SARS-Cov-2 infection and should not be used as the sole basis for treatment or other patient management decisions. A negative result may occur with  improper specimen collection/handling, submission of specimen other than nasopharyngeal swab, presence of viral mutation(s) within the areas targeted by this assay, and inadequate number of viral copies(<138 copies/mL). A negative result must be combined with clinical observations, patient history, and epidemiological information. The expected result is Negative.  Fact Sheet for Patients:  EntrepreneurPulse.com.au  Fact Sheet for Healthcare Providers:  IncredibleEmployment.be  This test is no t yet approved or cleared by the [place]  FDA and  has been authorized for detection and/or diagnosis of SARS-CoV-2 by FDA under an Emergency Use Authorization (EUA). This EUA will remain  in effect (meaning this test can be used) for the duration of the COVID-19 declaration under Section 564(b)(1) of the Act, 21 U.S.C.section 360bbb-3(b)(1), unless the authorization is terminated  or revoked sooner.       Influenza A by PCR NEGATIVE NEGATIVE Final   Influenza B by PCR NEGATIVE NEGATIVE Final    Comment: (NOTE) The Xpert Xpress SARS-CoV-2/FLU/RSV plus assay is intended as an aid in  the diagnosis of influenza from Nasopharyngeal swab specimens and should not be used as a sole basis for treatment. Nasal washings and aspirates are unacceptable for Xpert Xpress SARS-CoV-2/FLU/RSV testing.  Fact Sheet for Patients: EntrepreneurPulse.com.au  Fact Sheet for Healthcare Providers: IncredibleEmployment.be  This test is not yet approved or cleared by the [place] FDA and has been authorized for detection and/or diagnosis of SARS-CoV-2 by FDA under an Emergency Use Authorization (EUA). This EUA will remain in effect (meaning this test can be used) for the duration of the COVID-19 declaration under Section 564(b)(1) of the Act, 21 U.S.C. section 360bbb-3(b)(1), unless the authorization is terminated or revoked.  Performed at [facility], [place] [address] [place], [place] [address]   MRSA Next Gen by PCR, Nasal     Status: None   Collection Time: 08/28/21 12:17 PM  Result Value Ref Range Status   MRSA by PCR Next Gen NOT DETECTED NOT DETECTED Final    Comment: (NOTE) The GeneXpert MRSA Assay (FDA approved for NASAL specimens only), is one component of a comprehensive MRSA colonization surveillance program. It is not intended to diagnose MRSA infection nor to guide or monitor treatment for MRSA infections. Test performance is not FDA approved in patients less than 55  years old. Performed at [facility], [place] [address] [place], [place] [address]      Labs: Basic Metabolic Panel: Recent Labs  Lab 08/27/21 1105 08/28/21 0046 08/29/21 0022  NA 136 139 138  K 4.4 4.5 3.9  CL 104 107 104  CO2 21* 22 23  GLUCOSE 129* 111* 93  BUN 17 20 31*  CREATININE 1.43* 1.51* 1.42*  CALCIUM 9.1 8.8* 8.9   Liver Function Tests: No results for input(s): AST, ALT, ALKPHOS, BILITOT, PROT, ALBUMIN in the last 168 hours. No results for input(s): LIPASE, AMYLASE in the last 168 hours. No results for input(s): AMMONIA in the last 168 hours. CBC: Recent Labs  Lab 08/27/21 1105  WBC 11.1*  HGB 14.8  HCT 46.2  MCV 103.1*  PLT 257   Cardiac Enzymes: No results for input(s): CKTOTAL, CKMB, CKMBINDEX, TROPONINI in the last 168 hours. BNP: BNP (last 3 results) Recent Labs    12/28/20 0816 03/22/21 0004 08/27/21 1246  BNP 705.8* 1,273.3* 1,366.1*    ProBNP (last 3 results) No results for input(s): PROBNP in the last 8760 hours.  CBG: No results for input(s): GLUCAP in the last 168 hours.     Signed:  Kayleen Memos, MD [facility] 08/29/2021, 5:59 PM

## 2021-08-29 NOTE — Plan of Care (Signed)
  Problem: Education: Goal: Knowledge of General Education information will improve Description: Including pain rating scale, medication(s)/side effects and non-pharmacologic comfort measures Outcome: Adequate for Discharge   Problem: Health Behavior/Discharge Planning: Goal: Ability to manage health-related needs will improve Outcome: Adequate for Discharge   Problem: Clinical Measurements: Goal: Ability to maintain clinical measurements within normal limits will improve Outcome: Adequate for Discharge Goal: Will remain free from infection Outcome: Adequate for Discharge Goal: Diagnostic test results will improve Outcome: Adequate for Discharge Goal: Respiratory complications will improve Outcome: Adequate for Discharge Goal: Cardiovascular complication will be avoided Outcome: Adequate for Discharge   Problem: Activity: Goal: Risk for activity intolerance will decrease Outcome: Adequate for Discharge   Problem: Nutrition: Goal: Adequate nutrition will be maintained Outcome: Adequate for Discharge   Problem: Coping: Goal: Level of anxiety will decrease Outcome: Adequate for Discharge   Problem: Elimination: Goal: Will not experience complications related to bowel motility Outcome: Adequate for Discharge Goal: Will not experience complications related to urinary retention Outcome: Adequate for Discharge   Problem: Pain Managment: Goal: General experience of comfort will improve Outcome: Adequate for Discharge   Problem: Safety: Goal: Ability to remain free from injury will improve Outcome: Adequate for Discharge   Problem: Skin Integrity: Goal: Risk for impaired skin integrity will decrease Outcome: Adequate for Discharge   Problem: Education: Goal: Ability to demonstrate management of disease process will improve Outcome: Adequate for Discharge Goal: Ability to verbalize understanding of medication therapies will improve Outcome: Adequate for Discharge Goal:  Individualized Educational Video(s) Outcome: Adequate for Discharge   Problem: Activity: Goal: Capacity to carry out activities will improve Outcome: Adequate for Discharge   Problem: Cardiac: Goal: Ability to achieve and maintain adequate cardiopulmonary perfusion will improve Outcome: Adequate for Discharge   Problem: Malnutrition  (NI-5.2) Goal: Food and/or nutrient delivery Description: Individualized approach for food/nutrient provision. Outcome: Adequate for Discharge

## 2021-08-29 NOTE — Progress Notes (Signed)
Patient discharge instructions reviewed with patient and spouse. Both verbalize understanding. Written copy given to patient spouse. Foley was removed and patient given a urinal for the trip home. Patient via wheelchair to spouses waiting car in stable condition

## 2021-08-29 NOTE — Progress Notes (Addendum)
Progress Note  Patient Name: Ryan [name] Date of Encounter: 08/29/2021  [facility] Cardiologist: Mertie Moores, MD   Subjective   Denies CP or dyspnea  Inpatient Medications    Scheduled Meds:  amiodarone  200 mg Oral Daily   apixaban  2.5 mg Oral BID   carvedilol  3.125 mg Oral BID WC   Chlorhexidine Gluconate Cloth  6 each Topical Daily   empagliflozin  10 mg Oral Daily   feeding supplement  237 mL Oral TID BM   finasteride  5 mg Oral Daily   furosemide  40 mg Intravenous Daily   guaiFENesin  1,200 mg Oral BID   levothyroxine  25 mcg Oral Daily   losartan  25 mg Oral Daily   multivitamin with minerals  1 tablet Oral Daily   sodium chloride flush  3 mL Intravenous Q12H   spironolactone  12.5 mg Oral Daily   tamsulosin  0.4 mg Oral QHS   Continuous Infusions:  sodium chloride     PRN Meds: sodium chloride, acetaminophen, allopurinol, hydrALAZINE, ipratropium-albuterol, meclizine, ondansetron (ZOFRAN) IV, sodium chloride flush   Vital Signs    Vitals:   08/28/21 2353 08/29/21 0412 08/29/21 0716 08/29/21 0751  BP: (!) 142/77 98/73 100/61   Pulse: 67 (!) 51  78  Resp: '20 16 18   '$ Temp: 98.2 F (36.8 C) 97.7 F (36.5 C) 97.8 F (36.6 C)   TempSrc: Oral Oral Oral   SpO2: 94% 97% 96%   Weight:  66.5 kg    Height:        Intake/Output Summary (Last 24 hours) at 08/29/2021 0854 Last data filed at 08/29/2021 [id number] Gross per 24 hour  Intake 440 ml  Output 1951 ml  Net -1511 ml    Last 3 Weights 08/29/2021 08/28/2021 08/27/2021  Weight (lbs) 146 lb 9.7 oz 154 lb 5.2 oz 153 lb  Weight (kg) 66.5 kg 70 kg 69.4 kg      Telemetry    Atrial fibrillation - Personally Reviewed  Physical Exam   GEN: NAD Neck: supple Cardiac: irregular, 2/6 systolic murmur Respiratory: diminished BS bases; no wheeze GI: Soft, NT/ND MS: No edema Neuro: Grossly intact Psych: Normal affect   Labs    High Sensitivity Troponin:   Recent Labs  Lab 08/27/21 1105  08/27/21 1247  TROPONINIHS 30* 34*       Chemistry Recent Labs  Lab 08/27/21 1105 08/28/21 0046 08/29/21 0022  NA 136 139 138  K 4.4 4.5 3.9  CL 104 107 104  CO2 21* 22 23  GLUCOSE 129* 111* 93  BUN 17 20 31*  CREATININE 1.43* 1.51* 1.42*  CALCIUM 9.1 8.8* 8.9  GFRNONAA 50* 46* 50*  ANIONGAP '11 10 11      '$ Hematology Recent Labs  Lab 08/27/21 1105  WBC 11.1*  RBC 4.48  HGB 14.8  HCT 46.2  MCV 103.1*  MCH 33.0  MCHC 32.0  RDW 13.7  PLT 257     BNP Recent Labs  Lab 08/27/21 1246  BNP 1,366.1*      DDimer  Recent Labs  Lab 08/27/21 1247  DDIMER 2.05*      Radiology    DG Chest 2 View  Result Date: 08/27/2021 CLINICAL DATA:  Chest pain EXAM: CHEST - 2 VIEW COMPARISON:  Chest radiograph 03/22/2021 FINDINGS: A left chest wall cardiac device and single associated lead are stable. Median sternotomy wires and mediastinal surgical clips are again noted. The heart is at the upper  limits of normal for size, unchanged. Lung volumes are low. Patchy opacities in the lateral left base likely reflect atelectasis or scar. There is no new focal airspace disease. There is no significant pleural effusion. There is no pneumothorax. Left shoulder hardware is again noted. There is degenerative change of the right shoulder. There is no acute osseous abnormality. Upper abdominal surgical clips are again noted. IMPRESSION: Stable borderline cardiomegaly. No radiographic evidence of acute cardiopulmonary process. Electronically Signed   By: Valetta Mole M.D.   On: 08/27/2021 11:47   CT HEAD WO CONTRAST (5MM)  Result Date: 08/27/2021 CLINICAL DATA:  Vertigo, peripheral. EXAM: CT HEAD WITHOUT CONTRAST TECHNIQUE: Contiguous axial images were obtained from the base of the skull through the vertex without intravenous contrast. COMPARISON:  None. FINDINGS: Brain: No evidence of acute infarction, hemorrhage, hydrocephalus, extra-axial collection or mass lesion/mass effect. There is mild  diffuse atrophy. There is mild periventricular white matter hypodensity, likely chronic small vessel ischemic change. Vascular: There are atherosclerotic calcifications of the intracranial internal carotid arteries. Skull: Normal. Negative for fracture or focal lesion. Sinuses/Orbits: No acute finding. Other: None. IMPRESSION: 1. No acute intracranial abnormality. 2. Mild diffuse atrophy.  Mild chronic small vessel ischemic change. Electronically Signed   By: Ronney Asters M.D.   On: 08/27/2021 17:16   CT Angio Chest PE W and/or Wo Contrast  Result Date: 08/27/2021 CLINICAL DATA:  PE suspected, low/intermediate probability, positive D-dimer EXAM: CT ANGIOGRAPHY CHEST WITH CONTRAST TECHNIQUE: Multidetector CT imaging of the chest was performed using the standard protocol during bolus administration of intravenous contrast. Multiplanar CT image reconstructions and MIPs were obtained to evaluate the vascular anatomy. CONTRAST:  58m OMNIPAQUE IOHEXOL 350 MG/ML SOLN COMPARISON:  2019 FINDINGS: Cardiovascular: Satisfactory opacification of the pulmonary arteries to the segmental level. No evidence of pulmonary embolism. Normal heart size. Coronary artery calcifications. No pericardial effusion. Thoracic aorta is normal in caliber with calcified plaque. Mediastinum/Nodes: No enlarged lymph nodes. Thyroid and esophagus are unremarkable. Lungs/Pleura: Small to moderate pleural effusions bilaterally with adjacent compressive atelectasis. Patchy ground-glass density with small consolidative opacities. No pneumothorax. Upper Abdomen: No acute abnormality. Musculoskeletal: Degenerative changes of the included spine. Post sternotomy. Left shoulder arthroplasty with associated streak artifact. Left chest wall ICD. Review of the MIP images confirms the above findings. IMPRESSION: No evidence of acute pulmonary embolism. Small to moderate bilateral pleural effusions with adjacent atelectasis. Patchy ground-glass density with  small consolidative opacities may reflect edema. Multifocal pneumonia is possible in the appropriate setting. Coronary artery calcification. Electronically Signed   By: PMacy MisM.D.   On: 08/27/2021 17:06   ECHOCARDIOGRAM COMPLETE  Result Date: 08/28/2021    ECHOCARDIOGRAM REPORT   Patient Name:   Ryan MARCHAKDate of Exam: 08/28/2021 Medical Rec #:  [id number]        Height:       66.0 in Accession #:    [id number]       Weight:       154.3 lb Date of Birth:  8May 27, 1942        BSA:          1.791 m Patient Age:    89years          BP:           152/62 mmHg Patient Gender: M                 HR:  57 bpm. Exam Location:  Inpatient Procedure: 2D Echo, Cardiac Doppler, Color Doppler and Intracardiac            Opacification Agent Indications:    CHF-Acute Systolic [id number]  History:        Patient has prior history of Echocardiogram examinations, most                 recent 12/28/2020. CHF, CAD and Previous Myocardial Infarction,                 Pacemaker, Carotid Disease and PAD, Arrythmias:Atrial                 Fibrillation, Signs/Symptoms:Shortness of Breath; Risk                 Factors:Hypertension and Dyslipidemia. GERD.  Sonographer:    Darlina Sicilian RDCS Referring Phys: [place]  1. Left ventricular ejection fraction, by estimation, is 50 to 55%. The left ventricle has low normal function. The left ventricle demonstrates regional wall motion abnormalities. Basal anterior/anteroseptal hypokinesis. There is severe asymmetric left ventricular hypertrophy of the basal-septal segment. Left ventricular diastolic parameters are indeterminate.  2. Right ventricular systolic function is moderately reduced. The right ventricular size is normal. There is normal pulmonary artery systolic pressure.  3. The mitral valve is normal in structure. Trivial mitral valve regurgitation. No evidence of mitral stenosis.  4. The inferior vena cava is normal in size with greater than 50%  respiratory variability, suggesting right atrial pressure of 3 mmHg.  5. The aortic valve is calcified. There is moderate calcification of the aortic valve. Aortic valve regurgitation is not visualized. Moderate aortic valve stenosis. Mean gradient only 62mHg, but AVA 1.0 cm^2 and DI 0.35, suspect paradoxical low flow low  gradient moderate AS given low SV index (21cc/m^2)  6. Small mobile echodensity adjacent to RV pacemaker lead (seen on image 14), could represent fibrinous strand or small adherent thrombus; if clinical concern for infection would check blood cultures FINDINGS  Left Ventricle: Left ventricular ejection fraction, by estimation, is 50 to 55%. The left ventricle has low normal function. The left ventricle demonstrates regional wall motion abnormalities. Definity contrast agent was given IV to delineate the left ventricular endocardial borders. The left ventricular internal cavity size was small. There is severe asymmetric left ventricular hypertrophy of the basal-septal segment. Left ventricular diastolic parameters are indeterminate. Right Ventricle: The right ventricular size is normal. Right vetricular wall thickness was not well visualized. Right ventricular systolic function is moderately reduced. There is normal pulmonary artery systolic pressure. The tricuspid regurgitant velocity is 2.14 m/s, and with an assumed right atrial pressure of 3 mmHg, the estimated right ventricular systolic pressure is 2Q000111QmmHg. Left Atrium: Left atrial size was normal in size. Right Atrium: Right atrial size was normal in size. Pericardium: Trivial pericardial effusion is present. Mitral Valve: The mitral valve is normal in structure. Trivial mitral valve regurgitation. No evidence of mitral valve stenosis. Tricuspid Valve: The tricuspid valve is normal in structure. Tricuspid valve regurgitation is trivial. Aortic Valve: The aortic valve is calcified. There is moderate calcification of the aortic valve. Aortic  valve regurgitation is not visualized. Moderate aortic stenosis is present. Aortic valve mean gradient measures 6.5 mmHg. Aortic valve peak gradient measures 11.8 mmHg. Aortic valve area, by VTI measures 0.87 cm. Pulmonic Valve: The pulmonic valve was not well visualized. Pulmonic valve regurgitation is not visualized. Aorta: The aortic root is normal in size and structure.  Venous: The inferior vena cava is normal in size with greater than 50% respiratory variability, suggesting right atrial pressure of 3 mmHg. IAS/Shunts: The interatrial septum was not well visualized.  LEFT VENTRICLE PLAX 2D LVIDd:         4.15 cm LVIDs:         3.30 cm LV PW:         1.15 cm LV IVS:        1.15 cm LVOT diam:     1.90 cm LV SV:         34 LV SV Index:   19 LVOT Area:     2.84 cm  LV Volumes (MOD) LV vol d, MOD A2C: 80.1 ml LV vol d, MOD A4C: 74.1 ml LV vol s, MOD A2C: 38.3 ml LV vol s, MOD A4C: 29.9 ml LV SV MOD A2C:     41.8 ml LV SV MOD A4C:     74.1 ml LV SV MOD BP:      44.4 ml RIGHT VENTRICLE RV S prime:     5.43 cm/s LEFT ATRIUM             Index       RIGHT ATRIUM           Index LA diam:        4.00 cm 2.23 cm/m  RA Area:     15.30 cm LA Vol (A2C):   40.5 ml 22.61 ml/m RA Volume:   35.80 ml  19.99 ml/m LA Vol (A4C):   45.5 ml 25.40 ml/m LA Biplane Vol: 44.6 ml 24.90 ml/m  AORTIC VALVE AV Area (Vmax):    0.80 cm AV Area (Vmean):   0.79 cm AV Area (VTI):     0.87 cm AV Vmax:           171.75 cm/s AV Vmean:          120.250 cm/s AV VTI:            0.388 m AV Peak Grad:      11.8 mmHg AV Mean Grad:      6.5 mmHg LVOT Vmax:         48.75 cm/s LVOT Vmean:        33.300 cm/s LVOT VTI:          0.119 m LVOT/AV VTI ratio: 0.31  AORTA Ao Root diam: 3.20 cm TRICUSPID VALVE TR Peak grad:   18.3 mmHg TR Vmax:        214.00 cm/s  SHUNTS Systemic VTI:  0.12 m Systemic Diam: 1.90 cm Oswaldo Milian MD Electronically signed by Oswaldo Milian MD Signature Date/Time: 08/28/2021/5:32:57 PM    Final      Patient Profile      80 year old male with coronary artery disease status post coronary bypass and graft, ischemic cardiomyopathy, chronic systolic congestive heart failure, prior ICD, persistent atrial fibrillation, prior CVA, chronic stage IIIa kidney disease for evaluation of acute on chronic combined systolic/diastolic congestive heart failure.  Most recent echocardiogram January 2022 showed ejection fraction 35 to 40%, moderate left atrial enlargement.    Assessment & Plan    1 acute on chronic combined systolic/diastolic congestive heart failure-symptoms have resolved.  We will change Lasix to 20 mg by mouth daily.  Continue low-dose spironolactone and Jardiance.  Needs fluid restriction and low-sodium diet.   2 ischemic cardiomyopathy-follow-up echocardiogram shows ejection fraction 50 to 55%, moderate RV dysfunction, possible moderate aortic stenosis the mean gradient 8 mmHg.  Continue low-dose carvedilol; will discontinue losartan as blood pressure is borderline and LV function improved.  Titrate carvedilol as an outpatient.     3 chronic stage IIIa kidney disease-would recheck potassium and renal function in 1 week following discharge.   4 persistent atrial fibrillation-patient remains in atrial fibrillation.  We will continue carvedilol.  If plan is rate control could potentially discontinue amiodarone as an outpatient when he sees Dr. Acie Fredrickson back.  Continue apixaban (dose reduced to 2.5 mg twice daily as creatinine is hovering close to 1.5).  Will need close follow-up as an outpatient.   5 prior ICD   6 coronary artery disease status post coronary artery bypass graft-continue statin.     7 vertigo-Per primary care.   8 chest pain-no recurrences.  Troponins not consistent with acute coronary syndrome.  No plans for further ischemia evaluation.  9 hypothyroidism-Per primary care.  10 aortic stenosis-patient will need follow-up echoes in the future.  Aortic stenosis likely mild at this  point.  Patient can be discharged from a cardiac standpoint.  Check potassium and renal function in 1 week.  Will arrange follow-up with APP in 2 to 4 weeks.  Follow-up Dr. Acie Fredrickson approximately 4 months.  Cardiology will sign off.  Please call with questions.  Continue present medications at discharge.  For questions or updates, please contact [place] Please consult [email or web address] for contact info under        Signed, [name] Ruths, MD  08/29/2021, 8:54 AM

## 2021-08-29 NOTE — Progress Notes (Signed)
Heart Failure Nurse Navigator Progress Note  PCP: Ryan [name]., MD PCP-Cardiologist: Ryan [name]., MD Admission Diagnosis: A/C CHF Admitted from: home with spouse  Presentation:   Ryan [name] presented with SOB and increased BLE edema. Pt interactive with interview process. Son at bedside. Pt states he still drives. Pt stated he was instructed to decrease his BP medicine by half via Cardiology. PCP decreased lasix to 3 days per week per pt statement. Pt feels this is what has caused his symptoms to worsen.   ECHO/ LVEF: 50-55%  Clinical Course:  Past Medical History:  Diagnosis Date   Arthritis    CAD (coronary artery disease)    Carotid artery occlusion    Cataract    Bil eyes/worse in left eye   CHF (congestive heart failure) (HCC)    Chronic back pain    DVT (deep venous thrombosis) (HCC)    Dysrhythmia    Enlarged prostate    takes Rapaflo daily   GERD (gastroesophageal reflux disease)    occasional   History of colon polyps    History of gout    has colchicine prn   History of kidney stones    Hyperlipidemia    takes Crestor daily   Hypertension    takes Amlodipine daily   Hypothyroidism    Myocardial infarction [facility])    Peripheral vascular disease ([place])    Pulmonary emboli ([place]) 03/20/2015   elevated d-dimer, intermediate V/Q study, atypical chest pain and SOB. Start on Xarelto '20mg'$  BID for 3 month   Rapid atrial fibrillation (HCC)    Renal insufficiency    Shortness of breath dyspnea    Urinary frequency    Urinary urgency      Social History   Socioeconomic History   Marital status: Married    Spouse name: Ryan [name]   Number of children: 2   Years of education: Not on file   Highest education level: 9th grade  Occupational History   Not on file  Tobacco Use   Smoking status: Former    Types: Cigarettes    Quit date: 02/04/1987    Years since quitting: 34.5   Smokeless tobacco: Former    Types: Chew    Quit date: 07/20/2009    Tobacco comments:    quit 35+yrs ago  Vaping Use   Vaping Use: Never used  Substance and Sexual Activity   Alcohol use: No    Alcohol/week: 0.0 standard drinks   Drug use: No   Sexual activity: Not Currently  Other Topics Concern   Not on file  Social History Narrative   Not on file   Social Determinants of Health   Financial Resource Strain: Low Risk    Difficulty of Paying Living Expenses: Not hard at all  Food Insecurity: No Food Insecurity   Worried About Charity fundraiser in the Last Year: Never true   [place] in the Last Year: Never true  Transportation Needs: No Transportation Needs   Lack of Transportation (Medical): No   Lack of Transportation (Non-Medical): No  Physical Activity: Not on file  Stress: Not on file  Social Connections: Not on file    High Risk Criteria for Readmission and/or Poor Patient Outcomes: Heart failure hospital admissions (last 6 months): 2  No Show rate: 1% Difficult social situation: no Demonstrates medication adherence: yes Primary Language: English Literacy level: able to read/write and comprehend. Wears reading glasses  Education Assessment and Provision:  Detailed education  and instructions provided on heart failure disease management including the following:  Signs and symptoms of Heart Failure When to call the physician Importance of daily weights Low sodium diet Fluid restriction Medication management Anticipated future follow-up appointments  Patient education given on each of the above topics.  Patient acknowledges understanding via teach back method and acceptance of all instructions.  Education Materials:  "Living Better With Heart Failure" Booklet, HF zone tool, & Daily Weight Tracker Tool.  Patient has scale at home: yes Patient has pill box at home: yes   Barriers of Care:   -none  Considerations/Referrals:   Referral made to Heart Failure Pharmacist Stewardship: yes, at [place] Referral made to  Heart Failure CSW/NCM TOC: no Referral made to Heart & Vascular TOC clinic: yes, 9/13 @ 1pm  Items for Follow-up on DC/TOC: -medication optimization  -medication side effects related to pt c/o dizziness and nausea/decreased appetite.    Pricilla Holm, MSN, RN Heart Failure Nurse Navigator [phone]

## 2021-09-01 ENCOUNTER — Telehealth (HOSPITAL_COMMUNITY): Payer: Self-pay

## 2021-09-01 NOTE — Progress Notes (Signed)
[facility]  PCP: Ginger Organ., MD  Primary Cardiologist: Mertie Moores, MD   HPI:  Ryan [name] is a 80 y.o.  male  with a PMH significant for CAD s/p CABG 2016 with most recent cath 01/2019 showing patent LIMA to LAD with competitive LIMA flow and occluded SVG to RCA/OM1 grafts, chronic systolic CHF with LVEF at 35-40%, hx of VT s/p ICD 12/2020, paroxysmal atrial fibrillation, DVT/PE on Eliquis, PVD with carotid stenosis, CVA, and CKD stage IIIa   2010 LHC with total occlusion of the right coronary artery, total occlusion of the left circumflex coronary artery following the first OM and a high-grade ostial stenosis of the diagonal branch of the LAD artery.  the LAD artery had diffuse 30-40 percent stenoses and provided collaterals to both the RCA and distal circumflex  Developed severe chest pain in early 2016 04/03/2015 underwent cardiac catheterization. This revealed chronic occlusion of the RCA and circumflex as well as progression in the LAD disease.  Underwent coronary artery bypass grafting x4 (left internal mammary artery to left anterior descending, sequential saphenous vein graft to diagonal 1 A and B, saphenous vein graft to posterior descending), endoscopic vein harvest, left thigh. Developed afib started on amio and beta blocker.  EF apparently low normal at that time.    01/2019 admitted for shortness of breath, chest pain.  Repeat LHC 01/2019 patent LIMA to LAD with competitive LIMA flow and occluded SVG to RCA/OM1 grafts.  No targets on PCI, amlodipine added to regimen. EF had improved to 50-55%  Most recently admitted 08/27/2021 with acute on chronic systolic CHF.  Started on IV diuresis. ECHO repeated 08/28/2021 EF 50-55%, moderately reduced RV function, Moderate AS.  Bisoprol switched to low dose carvedilol, spironolactone added.  Losartan held due to low bp and dizziness.    Here for hospital FU still having dizziness,  lightheadedness.  Still having fatigue since discharge. He feels like his dizziness is much worse since the medication changes that took place during hospitalization. He is concerned about adding any new medicines today. Not checking bp but weight has been stable.     ROS: All systems negative except as listed in HPI, PMH and Problem List.  SH:  Social History   Socioeconomic History   Marital status: Married    Spouse name: Ryan [name]   Number of children: 2   Years of education: Not on file   Highest education level: 9th grade  Occupational History   Not on file  Tobacco Use   Smoking status: Former    Types: Cigarettes    Quit date: 02/04/1987    Years since quitting: 34.6   Smokeless tobacco: Former    Types: Chew    Quit date: 07/20/2009   Tobacco comments:    quit 35+yrs ago  Vaping Use   Vaping Use: Never used  Substance and Sexual Activity   Alcohol use: No    Alcohol/week: 0.0 standard drinks   Drug use: No   Sexual activity: Not Currently  Other Topics Concern   Not on file  Social History Narrative   Not on file   Social Determinants of Health   Financial Resource Strain: Low Risk    Difficulty of Paying Living Expenses: Not hard at all  Food Insecurity: No Food Insecurity   Worried About Charity fundraiser in the Last Year: Never true   [place] in the Last Year: Never true  Transportation Needs: No Data processing manager (Medical): No   Lack of Transportation (Non-Medical): No  Physical Activity: Not on file  Stress: Not on file  Social Connections: Not on file  Intimate Partner Violence: Not on file    FH:  Family History  Problem Relation Age of Onset   Heart disease Father    Heart attack Father    Heart disease Sister    Hypertension Sister    Heart attack Sister    Hypertension Mother    Diabetes Son     Past Medical History:  Diagnosis Date   Arthritis    CAD (coronary artery disease)    Carotid  artery occlusion    Cataract    Bil eyes/worse in left eye   CHF (congestive heart failure) (HCC)    Chronic back pain    DVT (deep venous thrombosis) (HCC)    Dysrhythmia    Enlarged prostate    takes Rapaflo daily   GERD (gastroesophageal reflux disease)    occasional   History of colon polyps    History of gout    has colchicine prn   History of kidney stones    Hyperlipidemia    takes Crestor daily   Hypertension    takes Amlodipine daily   Hypothyroidism    Myocardial infarction [facility])    Peripheral vascular disease ([place])    Pulmonary emboli ([place]) 03/20/2015   elevated d-dimer, intermediate V/Q study, atypical chest pain and SOB. Start on Xarelto '20mg'$  BID for 3 month   Rapid atrial fibrillation (HCC)    Renal insufficiency    Shortness of breath dyspnea    Urinary frequency    Urinary urgency     Current Outpatient Medications  Medication Sig Dispense Refill   allopurinol (ZYLOPRIM) 100 MG tablet Take 100 mg by mouth daily as needed (gout).     amiodarone (PACERONE) 400 MG tablet Take 0.5 tablets (200 mg total) by mouth daily. 45 tablet 3   apixaban (ELIQUIS) 2.5 MG TABS tablet Take 1 tablet (2.5 mg total) by mouth 2 (two) times daily. 60 tablet 0   carvedilol (COREG) 3.125 MG tablet Take 1 tablet (3.125 mg total) by mouth 2 (two) times daily with a meal. 60 tablet 0   empagliflozin (JARDIANCE) 10 MG TABS tablet Take 1 tablet (10 mg total) by mouth daily. 30 tablet 0   Evolocumab (REPATHA SURECLICK) [id number] MG/ML SOAJ Inject 1 pen into the skin every 14 (fourteen) days. 2 mL 11   finasteride (PROSCAR) 5 MG tablet Take 5 mg by mouth daily.     furosemide (LASIX) 20 MG tablet Take 1 tablet (20 mg total) by mouth daily. 30 tablet 0   levothyroxine (SYNTHROID) 25 MCG tablet Take 25 mcg by mouth daily.     meclizine (ANTIVERT) 25 MG tablet Take 25 mg by mouth every 8 (eight) hours as needed for dizziness.     Multiple Vitamin (MULTIVITAMIN WITH MINERALS) TABS tablet Take 1 tablet  by mouth daily. 30 tablet 0   nitroGLYCERIN (NITROSTAT) 0.4 MG SL tablet Place 1 tablet (0.4 mg total) under the tongue every 5 (five) minutes as needed for chest pain. 25 tablet 3   spironolactone (ALDACTONE) 25 MG tablet Take 0.5 tablets (12.5 mg total) by mouth daily. 15 tablet 0   tamsulosin (FLOMAX) 0.4 MG CAPS capsule Take 0.4 mg by mouth at bedtime.     No current facility-administered medications for this encounter.    Vitals:  09/02/21 1255  BP: 110/70  Pulse: 77  SpO2: 98%  Weight: 68.8 kg (151 lb 9.6 oz)    PHYSICAL EXAM: Cardiac: JVD flat, normal rate and rhythm, clear s1 and s2, no murmurs, rubs or gallops, no LE edema Pulmonary: CTAB, not in distress Abdominal: non distended abdomen, soft and nontender Psych: Alert, conversant, in good spirits   ASSESSMENT & PLAN: Ischemic CM Systolic CHF with recovery -Repeat LHC 01/2019 patent LIMA to LAD with competitive LIMA flow and occluded SVG to RCA/OM1 grafts.  No targets on PCI, amlodipine added to regimen. EF had improved to 50-55% -ECHO repeated 08/28/2021 EF 50-55%, moderately reduced RV function, Moderate AS -NYHA Class II, euvolemic on exam -He complains of worsening dizziness and light headedness he thinks it is due to his medications, I'm not convinced based on chart review.  We had a long discussion about the importance of GDMT but ultimately agreed to trial holding spironolactone but I explained I felt this would not likely make a difference.  BP is on the low side here today and he does not check bp at home unfortunately.   -Continue carvedilol 3.125 BID, Jardiance 10 -Hopefully will be able to restart spiro or losartan at follow up  CAD, s/p CABG -Repeat LHC 01/2019 patent LIMA to LAD with competitive LIMA flow and occluded SVG to RCA/OM1 grafts.  No targets on PCI, amlodipine added to regimen. EF had improved to 50-55% -Continue eliquis, repatha, bb, GDMT above  Persistent AFib -CHADS2VASC 5 -remains rate  controlled -on lower dose eliquis due to borderline cr, continue -Continue amio '200mg'$  daily, carvedilol 3.125 BID  CKD 3a  -repeat bmp today   Follow up w/ Dr. Acie Fredrickson

## 2021-09-01 NOTE — Telephone Encounter (Signed)
Called to confirm [facility] Transitions of Care appointment at 9/13 @ 1pm. Patient reminded to bring all medications and pill box organizer with them. Confirmed patient has transportation. Gave directions, instructed to utilize [place] parking.  Confirmed appointment prior to ending call.   Pricilla Holm, MSN, RN Heart Failure Nurse Navigator [phone]

## 2021-09-02 ENCOUNTER — Ambulatory Visit (HOSPITAL_COMMUNITY)
Admit: 2021-09-02 | Discharge: 2021-09-02 | Disposition: A | Discharge status: Home / Self Care | Payer: Medicare Other | Source: Ambulatory Visit | Attending: Internal Medicine | Admitting: Internal Medicine

## 2021-09-02 ENCOUNTER — Other Ambulatory Visit: Payer: Self-pay

## 2021-09-02 VITALS — BP 110/70 | HR 77 | Wt 151.6 lb

## 2021-09-02 DIAGNOSIS — I5042 Chronic combined systolic (congestive) and diastolic (congestive) heart failure: Secondary | ICD-10-CM | POA: Diagnosis not present

## 2021-09-02 DIAGNOSIS — I480 Paroxysmal atrial fibrillation: Secondary | ICD-10-CM

## 2021-09-02 DIAGNOSIS — I2583 Coronary atherosclerosis due to lipid rich plaque: Secondary | ICD-10-CM

## 2021-09-02 DIAGNOSIS — I2510 Atherosclerotic heart disease of native coronary artery without angina pectoris: Secondary | ICD-10-CM

## 2021-09-02 DIAGNOSIS — N1831 Chronic kidney disease, stage 3a: Secondary | ICD-10-CM

## 2021-09-02 LAB — BASIC METABOLIC PANEL
Anion gap: 8 (ref 5–15)
BUN: 23 mg/dL (ref 8–23)
CO2: 29 mmol/L (ref 22–32)
Calcium: 9 mg/dL (ref 8.9–10.3)
Chloride: 102 mmol/L (ref 98–111)
Creatinine, Ser: 1.49 mg/dL — ABNORMAL HIGH (ref 0.61–1.24)
GFR, Estimated: 47 mL/min — ABNORMAL LOW (ref >60)
Glucose, Bld: 96 mg/dL (ref 70–99)
Potassium: 4.6 mmol/L (ref 3.5–5.1)
Sodium: 139 mmol/L (ref 135–145)

## 2021-09-02 NOTE — Patient Instructions (Addendum)
Labs done today. We will contact you only if your labs are abnormal.  STOP taking Spironolactone.  No other medication changes were made. Please continue all current medications as prescribed.  Please follow up soon with Dr. Acie Fredrickson.

## 2021-09-10 ENCOUNTER — Observation Stay (HOSPITAL_COMMUNITY)
Admission: EM | Admit: 2021-09-10 | Discharge: 2021-09-11 | Disposition: A | Discharge status: Home / Self Care | Payer: Medicare Other | Attending: Emergency Medicine | Admitting: Emergency Medicine

## 2021-09-10 ENCOUNTER — Emergency Department (HOSPITAL_COMMUNITY): Payer: Medicare Other

## 2021-09-10 ENCOUNTER — Encounter (HOSPITAL_COMMUNITY): Payer: Self-pay

## 2021-09-10 ENCOUNTER — Other Ambulatory Visit: Payer: Self-pay

## 2021-09-10 DIAGNOSIS — R079 Chest pain, unspecified: Secondary | ICD-10-CM | POA: Diagnosis not present

## 2021-09-10 DIAGNOSIS — N179 Acute kidney failure, unspecified: Secondary | ICD-10-CM | POA: Diagnosis present

## 2021-09-10 DIAGNOSIS — N183 Chronic kidney disease, stage 3 unspecified: Secondary | ICD-10-CM | POA: Diagnosis present

## 2021-09-10 DIAGNOSIS — R0602 Shortness of breath: Secondary | ICD-10-CM | POA: Diagnosis not present

## 2021-09-10 DIAGNOSIS — R112 Nausea with vomiting, unspecified: Secondary | ICD-10-CM | POA: Diagnosis present

## 2021-09-10 DIAGNOSIS — N1831 Chronic kidney disease, stage 3a: Secondary | ICD-10-CM | POA: Diagnosis not present

## 2021-09-10 DIAGNOSIS — I2510 Atherosclerotic heart disease of native coronary artery without angina pectoris: Secondary | ICD-10-CM | POA: Insufficient documentation

## 2021-09-10 DIAGNOSIS — R42 Dizziness and giddiness: Secondary | ICD-10-CM | POA: Diagnosis present

## 2021-09-10 DIAGNOSIS — I5032 Chronic diastolic (congestive) heart failure: Secondary | ICD-10-CM | POA: Diagnosis present

## 2021-09-10 DIAGNOSIS — I5042 Chronic combined systolic (congestive) and diastolic (congestive) heart failure: Secondary | ICD-10-CM | POA: Diagnosis present

## 2021-09-10 DIAGNOSIS — I4819 Other persistent atrial fibrillation: Secondary | ICD-10-CM | POA: Diagnosis present

## 2021-09-10 DIAGNOSIS — I130 Hypertensive heart and chronic kidney disease with heart failure and stage 1 through stage 4 chronic kidney disease, or unspecified chronic kidney disease: Secondary | ICD-10-CM | POA: Insufficient documentation

## 2021-09-10 DIAGNOSIS — K7689 Other specified diseases of liver: Secondary | ICD-10-CM | POA: Diagnosis not present

## 2021-09-10 DIAGNOSIS — R7989 Other specified abnormal findings of blood chemistry: Secondary | ICD-10-CM | POA: Diagnosis not present

## 2021-09-10 DIAGNOSIS — H814 Vertigo of central origin: Secondary | ICD-10-CM | POA: Diagnosis not present

## 2021-09-10 DIAGNOSIS — N281 Cyst of kidney, acquired: Secondary | ICD-10-CM | POA: Diagnosis not present

## 2021-09-10 DIAGNOSIS — I774 Celiac artery compression syndrome: Secondary | ICD-10-CM | POA: Diagnosis not present

## 2021-09-10 DIAGNOSIS — N189 Chronic kidney disease, unspecified: Secondary | ICD-10-CM | POA: Diagnosis not present

## 2021-09-10 DIAGNOSIS — I480 Paroxysmal atrial fibrillation: Secondary | ICD-10-CM

## 2021-09-10 DIAGNOSIS — Z96643 Presence of artificial hip joint, bilateral: Secondary | ICD-10-CM | POA: Insufficient documentation

## 2021-09-10 DIAGNOSIS — I745 Embolism and thrombosis of iliac artery: Secondary | ICD-10-CM | POA: Diagnosis not present

## 2021-09-10 DIAGNOSIS — Z87891 Personal history of nicotine dependence: Secondary | ICD-10-CM | POA: Diagnosis not present

## 2021-09-10 DIAGNOSIS — I517 Cardiomegaly: Secondary | ICD-10-CM | POA: Diagnosis not present

## 2021-09-10 DIAGNOSIS — Z0389 Encounter for observation for other suspected diseases and conditions ruled out: Secondary | ICD-10-CM | POA: Diagnosis not present

## 2021-09-10 DIAGNOSIS — K5730 Diverticulosis of large intestine without perforation or abscess without bleeding: Secondary | ICD-10-CM | POA: Diagnosis not present

## 2021-09-10 DIAGNOSIS — Z79899 Other long term (current) drug therapy: Secondary | ICD-10-CM | POA: Insufficient documentation

## 2021-09-10 DIAGNOSIS — Z20822 Contact with and (suspected) exposure to covid-19: Secondary | ICD-10-CM | POA: Diagnosis not present

## 2021-09-10 DIAGNOSIS — I5022 Chronic systolic (congestive) heart failure: Secondary | ICD-10-CM | POA: Insufficient documentation

## 2021-09-10 DIAGNOSIS — I10 Essential (primary) hypertension: Secondary | ICD-10-CM

## 2021-09-10 DIAGNOSIS — I5043 Acute on chronic combined systolic (congestive) and diastolic (congestive) heart failure: Secondary | ICD-10-CM | POA: Diagnosis present

## 2021-09-10 LAB — CBC WITH DIFFERENTIAL/PLATELET
Abs Immature Granulocytes: 0.09 10*3/uL — ABNORMAL HIGH (ref 0.00–0.07)
Basophils Absolute: 0 10*3/uL (ref 0.0–0.1)
Basophils Relative: 0 %
Eosinophils Absolute: 0.1 10*3/uL (ref 0.0–0.5)
Eosinophils Relative: 1 %
HCT: 49.2 % (ref 39.0–52.0)
Hemoglobin: 16.1 g/dL (ref 13.0–17.0)
Immature Granulocytes: 1 %
Lymphocytes Relative: 7 %
Lymphs Abs: 1.1 10*3/uL (ref 0.7–4.0)
MCH: 32.8 pg (ref 26.0–34.0)
MCHC: 32.7 g/dL (ref 30.0–36.0)
MCV: 100.2 fL — ABNORMAL HIGH (ref 80.0–100.0)
Monocytes Absolute: 0.2 10*3/uL (ref 0.1–1.0)
Monocytes Relative: 1 %
Neutro Abs: 14.1 10*3/uL — ABNORMAL HIGH (ref 1.7–7.7)
Neutrophils Relative %: 90 %
Platelets: 297 10*3/uL (ref 150–400)
RBC: 4.91 MIL/uL (ref 4.22–5.81)
RDW: 13.2 % (ref 11.5–15.5)
WBC: 15.6 10*3/uL — ABNORMAL HIGH (ref 4.0–10.5)
nRBC: 0 % (ref 0.0–0.2)

## 2021-09-10 LAB — COMPREHENSIVE METABOLIC PANEL
ALT: 19 U/L (ref 0–44)
AST: 29 U/L (ref 15–41)
Albumin: 3.6 g/dL (ref 3.5–5.0)
Alkaline Phosphatase: 59 U/L (ref 38–126)
Anion gap: 11 (ref 5–15)
BUN: 19 mg/dL (ref 8–23)
CO2: 24 mmol/L (ref 22–32)
Calcium: 9.2 mg/dL (ref 8.9–10.3)
Chloride: 104 mmol/L (ref 98–111)
Creatinine, Ser: 1.76 mg/dL — ABNORMAL HIGH (ref 0.61–1.24)
GFR, Estimated: 39 mL/min — ABNORMAL LOW (ref >60)
Glucose, Bld: 153 mg/dL — ABNORMAL HIGH (ref 70–99)
Potassium: 4.2 mmol/L (ref 3.5–5.1)
Sodium: 139 mmol/L (ref 135–145)
Total Bilirubin: 0.9 mg/dL (ref 0.3–1.2)
Total Protein: 7.1 g/dL (ref 6.5–8.1)

## 2021-09-10 LAB — URINALYSIS, ROUTINE W REFLEX MICROSCOPIC
Bilirubin Urine: NEGATIVE
Glucose, UA: >=500 mg/dL — AB
Hgb urine dipstick: NEGATIVE
Ketones, ur: NEGATIVE mg/dL
Leukocytes,Ua: NEGATIVE
Nitrite: NEGATIVE
Protein, ur: NEGATIVE mg/dL
Specific Gravity, Urine: <1.005 — ABNORMAL LOW (ref 1.005–1.030)
pH: 5 (ref 5.0–8.0)

## 2021-09-10 LAB — TROPONIN I (HIGH SENSITIVITY)
Troponin I (High Sensitivity): 42 ng/L — ABNORMAL HIGH (ref <18)
Troponin I (High Sensitivity): 40 ng/L — ABNORMAL HIGH (ref <18)

## 2021-09-10 LAB — RESP PANEL BY RT-PCR (FLU A&B, COVID) ARPGX2
Influenza A by PCR: NEGATIVE
Influenza B by PCR: NEGATIVE
SARS Coronavirus 2 by RT PCR: NEGATIVE

## 2021-09-10 LAB — LIPASE, BLOOD: Lipase: 26 U/L (ref 11–51)

## 2021-09-10 LAB — URINALYSIS, MICROSCOPIC (REFLEX)

## 2021-09-10 LAB — BRAIN NATRIURETIC PEPTIDE: B Natriuretic Peptide: 852.2 pg/mL — ABNORMAL HIGH (ref 0.0–100.0)

## 2021-09-10 LAB — MAGNESIUM: Magnesium: 2.1 mg/dL (ref 1.7–2.4)

## 2021-09-10 MED ORDER — ADULT MULTIVITAMIN W/MINERALS CH
1.0000 | ORAL_TABLET | Freq: Every day | ORAL | Status: DC
  Administered 2021-09-11: 1 via ORAL
  Filled 2021-09-10 (×2): qty 1

## 2021-09-10 MED ORDER — ONDANSETRON HCL 4 MG/2ML IJ SOLN: 4.0000 mg | Freq: Four times a day (QID) | INTRAMUSCULAR | Status: DC | PRN

## 2021-09-10 MED ORDER — TAMSULOSIN HCL 0.4 MG PO CAPS
0.4000 mg | ORAL_CAPSULE | Freq: Every day | ORAL | Status: DC
  Administered 2021-09-10: 0.4 mg via ORAL
  Filled 2021-09-10: qty 1

## 2021-09-10 MED ORDER — OXYCODONE-ACETAMINOPHEN 5-325 MG PO TABS: 1.0000 | ORAL_TABLET | Freq: Once | ORAL | Status: DC

## 2021-09-10 MED ORDER — AMIODARONE HCL 200 MG PO TABS
200.0000 mg | ORAL_TABLET | Freq: Every day | ORAL | Status: DC
  Administered 2021-09-10 – 2021-09-11 (×2): 200 mg via ORAL
  Filled 2021-09-10 (×2): qty 1

## 2021-09-10 MED ORDER — APIXABAN 2.5 MG PO TABS
2.5000 mg | ORAL_TABLET | Freq: Two times a day (BID) | ORAL | Status: DC
  Administered 2021-09-10 – 2021-09-11 (×3): 2.5 mg via ORAL
  Filled 2021-09-10 (×3): qty 1

## 2021-09-10 MED ORDER — ALLOPURINOL 100 MG PO TABS: 100.0000 mg | ORAL_TABLET | Freq: Every day | ORAL | Status: DC | PRN

## 2021-09-10 MED ORDER — MECLIZINE HCL 25 MG PO TABS
25.0000 mg | ORAL_TABLET | Freq: Three times a day (TID) | ORAL | Status: DC | PRN
  Filled 2021-09-10: qty 1

## 2021-09-10 MED ORDER — FINASTERIDE 5 MG PO TABS
5.0000 mg | ORAL_TABLET | Freq: Every day | ORAL | Status: DC
  Administered 2021-09-11: 5 mg via ORAL
  Filled 2021-09-10 (×2): qty 1

## 2021-09-10 MED ORDER — ACETAMINOPHEN 650 MG RE SUPP: 650.0000 mg | Freq: Four times a day (QID) | RECTAL | Status: DC | PRN

## 2021-09-10 MED ORDER — ACETAMINOPHEN 325 MG PO TABS
650.0000 mg | ORAL_TABLET | Freq: Four times a day (QID) | ORAL | Status: DC | PRN
  Administered 2021-09-10: 650 mg via ORAL
  Filled 2021-09-10: qty 2

## 2021-09-10 MED ORDER — OXYCODONE-ACETAMINOPHEN 5-325 MG PO TABS
1.0000 | ORAL_TABLET | Freq: Once | ORAL | Status: AC
  Administered 2021-09-10: 1 via ORAL
  Filled 2021-09-10: qty 1

## 2021-09-10 MED ORDER — ONDANSETRON HCL 4 MG PO TABS: 4.0000 mg | ORAL_TABLET | Freq: Four times a day (QID) | ORAL | Status: DC | PRN

## 2021-09-10 MED ORDER — ACETAMINOPHEN 325 MG PO TABS: 650.0000 mg | ORAL_TABLET | Freq: Once | ORAL | Status: DC

## 2021-09-10 MED ORDER — FENTANYL CITRATE PF 50 MCG/ML IJ SOSY
50.0000 ug | PREFILLED_SYRINGE | Freq: Once | INTRAMUSCULAR | Status: AC | PRN
  Administered 2021-09-10: 50 ug via INTRAVENOUS
  Filled 2021-09-10: qty 1

## 2021-09-10 MED ORDER — LEVOTHYROXINE SODIUM 25 MCG PO TABS
25.0000 ug | ORAL_TABLET | Freq: Every day | ORAL | Status: DC
  Administered 2021-09-11: 25 ug via ORAL
  Filled 2021-09-10 (×2): qty 1

## 2021-09-10 MED ORDER — IOHEXOL 350 MG/ML SOLN
65.0000 mL | Freq: Once | INTRAVENOUS | Status: AC | PRN
  Administered 2021-09-10: 65 mL via INTRAVENOUS

## 2021-09-10 MED ORDER — SODIUM CHLORIDE 0.9 % IV BOLUS
1000.0000 mL | Freq: Once | INTRAVENOUS | Status: AC
  Administered 2021-09-10: 1000 mL via INTRAVENOUS

## 2021-09-10 MED ORDER — ONDANSETRON 4 MG PO TBDP
4.0000 mg | ORAL_TABLET | Freq: Once | ORAL | Status: AC | PRN
  Administered 2021-09-10: 4 mg via ORAL
  Filled 2021-09-10: qty 1

## 2021-09-10 MED ORDER — CARVEDILOL 3.125 MG PO TABS: 3.1250 mg | ORAL_TABLET | Freq: Two times a day (BID) | ORAL | Status: DC

## 2021-09-10 MED ORDER — MIDODRINE HCL 5 MG PO TABS
5.0000 mg | ORAL_TABLET | Freq: Three times a day (TID) | ORAL | Status: DC
  Administered 2021-09-10 – 2021-09-11 (×2): 5 mg via ORAL
  Filled 2021-09-10 (×2): qty 1

## 2021-09-10 MED ORDER — ONDANSETRON HCL 4 MG/2ML IJ SOLN
4.0000 mg | Freq: Once | INTRAMUSCULAR | Status: AC
  Administered 2021-09-10: 4 mg via INTRAVENOUS
  Filled 2021-09-10: qty 2

## 2021-09-10 MED ORDER — LACTATED RINGERS IV SOLN: INTRAVENOUS | Status: DC

## 2021-09-10 NOTE — ED Notes (Signed)
Patient transported to CT 

## 2021-09-10 NOTE — ED Notes (Signed)
Put using urinal at this time

## 2021-09-10 NOTE — ED Provider Notes (Signed)
Emergency Medicine Provider Triage Evaluation Note  Ryan [name] , a 80 y.o. male  was evaluated in triage.  Pt complains of "I feel sick".   The patient reports bilateral upper abdominal pain that radiates through to his right shoulder accompanied by nausea, vomiting, headache, room spinning dizziness, that began 4 days ago.  He reports that his barely eaten over the last 4 days due to symptoms.  States that his symptoms feel similar prior to his last hospitalization.  He reports that he was also having right ear pain earlier this week and was not even able to touch the outside of his right ear, but reports that this has improved.  Headache is all over.  He has not been voiding as much due to vomiting.  Reports that the pain worsened tonight, which brought him to the emergency department for further evaluation.  He denies leg swelling, diarrhea, fever, chills.   Patient has a history of chronic combined systolic and diastolic heart failure with LVEF of 30 to 35%, HTN, hyperlipidemia, CAD status post CABG and stenting, PVD, PAF on Eliquis, VT status post AICD, DVT on Eliquis, hypothyroidism.  Review of Systems  Positive: Abdominal pain, dizziness, headache, nausea, vomiting, decreased urinary output, chest pain, shortness of breath Negative: Leg swelling, diarrhea, fever, chills, syncope  Physical Exam  BP (!) 109/53 (BP Location: Right Arm)   Pulse 61   Temp 99.3 F (37.4 C)   Resp 15   Ht 5\' 6"  (1.676 m)   Wt 65.8 kg   SpO2 100%   BMI 23.40 kg/m  Gen:   Awake, ill-appearing Resp:  Normal effort crackles noted bilaterally MSK:   Moves extremities without difficulty, no peripheral edema Other:  Tender to palpation in the bilateral upper abdomen, right greater than left.  Medical Decision Making  Medically screening exam initiated at 2:13 AM.  Appropriate orders placed.  Ryan [name] was informed that the remainder of the evaluation will be completed by another provider, this  initial triage assessment does not replace that evaluation, and the importance of remaining in the ED until their evaluation is complete.  Labs have been ordered.  Discussed with charge nurse.  He will need to be roomed to be fully evaluated soon.   Joanne Gavel, PA-C 09/10/21 [id number]    Ezequiel Essex, MD 09/10/21 2026

## 2021-09-10 NOTE — Progress Notes (Signed)
PROGRESS NOTE  Ryan [name]:[id number] DOB: 04-28-1941   PCP: Ginger Organ., MD  Patient is from: Home.  Lives with his wife.  Independently ambulates at baseline.  DOA: 09/10/2021 LOS: 0  Chief complaints:  Chief Complaint  Patient presents with   Chest Pain   Nausea   Abdominal Pain     Brief Narrative / Interim history: 80 year old male with HTN, systolic CHF, CKD, CAD s/p CABG, PE, PVD, occluded distal AA s/p aorto bi femoral bypass graft, vertigo?,  VT s/p AICD, R TKA and recent hospitalization for CHF exacerbation from 9/7-9/8 returning with nausea, vomiting, worsening dizziness, chest pain and abdominal pain for about 2 days, and admitted for AKI, vertigo, nausea and vomiting.  SBP 90s.  CXR, CTA chest/abdomen/pelvis without acute finding.  WBC 16.  Troponin flat at 40s.  BNP 852 (lower than prior values).  CT head   Subjective: Patient reports some nausea but no further emesis since midnight.  He reports feeling dizzy but not able to characterize his dizziness.  He says both lightheadedness and spinning.  He thinks this has gotten worse since his heart medications were adjusted by his cardiologist about 3 weeks ago.  He also reports poor p.o. intake and about 50 pound weight loss in the last 12 months.  He said he had a colonoscopy in 2014 and nothing major was found.  Patient's wife at bedside.  Objective: Vitals:   09/10/21 0643 09/10/21 1047 09/10/21 1415 09/10/21 1430  BP: 104/64 (!) 100/51 (!) 89/50 (!) 93/45  Pulse: (!) 59 63 (!) 51 (!) 58  Resp: 16 15 12 13   Temp:      SpO2: 98% 100% 95% 96%  Weight:      Height:        Intake/Output Summary (Last 24 hours) at 09/10/2021 1455 Last data filed at 09/10/2021 1118 Gross per 24 hour  Intake --  Output 150 ml  Net -150 ml   Filed Weights   09/10/21 0146  Weight: 65.8 kg    Examination:  GENERAL: No apparent distress.  Nontoxic. HEENT: MMM.  Vision and hearing grossly intact.  NECK: Supple.  No  apparent JVD.  RESP: 98% on RA.  No IWOB.  Fair aeration bilaterally. CVS:  RRR. Heart sounds normal.  ABD/GI/GU: BS+. Abd soft, NTND.  MSK/EXT:  Moves extremities. No apparent deformity. No edema.  SKIN: no apparent skin lesion or wound NEURO: Awake, alert and oriented appropriately.  No apparent focal neuro deficit. PSYCH: Calm. Normal affect.   Procedures:  None  Microbiology summarized: [id number] and influenza PCR nonreactive.  Assessment & Plan: AKI on CKD-3A: Likely prerenal from poor p.o. intake and diuretics.  Still with soft blood pressures. Recent Labs    12/30/20 0106 01/15/21 1148 02/07/21 0934 03/22/21 0004 03/23/21 0037 08/27/21 1105 08/28/21 0046 08/29/21 0022 09/02/21 1402 09/10/21 0149  BUN 18 25 22 19 20 17 20  31* 23 19  CREATININE 1.32* 1.54* 1.25 1.52* 1.43* 1.43* 1.51* 1.42* 1.49* 1.76*  -Decrease continuous IV fluid given CHF. -Monitor renal functions  Intractable nausea and vomiting-unclear etiology of this.  Abdominal exam benign.  CT chest/abdomen and pelvis without significant finding.  He seems to have vertigo.  Has not had further emesis but still with nausea.  Abdominal exam benign. -Continue gentle IV fluids and antiemetics  Acute on chronic vertigo-his neuro exam is reassuring.  No nystagmus or cerebellar deficit. -May benefit from vestibular PT -Reasonable to get CT head given associated nausea  and vomiting  Chronic systolic CHF: TTE on 9/8 with LVEF of 50 to 55% (previously 35 to 40%), RWMA (new), indeterminate DD, moderate AS and small mobile equal density adjacent to RV pacemaker lead.  Appears hypovolemic on exam.  See XR and CT without acute finding.  BNP lower than baseline. -Monitor respiratory and fluid status while on gentle IV fluid -Continue holding home Lasix. -Monitor renal functions and electrolytes  History of CAD/CABG: Denies chest pain -Hold home Coreg in the setting of hypotension  Mildly elevated troponin-likely due to  poor renal clearance.  Remained flat in 29s.  Paroxysmal A. Fib/history of VT s/p AICD: Rate controlled.  On low-dose Coreg and low-dose Eliquis -May consider metoprolol moving forward given soft blood pressure -Continue Eliquis for anticoagulation  Hypotension/history of hypertension: Remains hypotensive. -Continue IV fluid-decrease rate given CHF  BPH with LUTS-he reports sense of incomplete void -Intermittent bladder scan -Strict intake and output  Poor oral intake/unintentional weight loss-last colonoscopy in 2014 sessile polyps (resected), moderate diverticulosis and internal hemorrhoid. Wt Readings from Last 10 Encounters:  09/10/21 65.8 kg  09/02/21 68.8 kg  08/29/21 66.5 kg  05/20/21 68.8 kg  03/23/21 69.3 kg  03/17/21 73.4 kg  01/15/21 68.5 kg  12/31/20 70 kg  07/29/20 73.4 kg  01/18/20 76.7 kg  -Consult dietitian  Body mass index is 23.4 kg/m.         DVT prophylaxis:  apixaban (ELIQUIS) tablet 2.5 mg Start: 09/10/21 1000 apixaban (ELIQUIS) tablet 2.5 mg  Code Status: Full code Family Communication: Updated patient's wife at bedside. Level of care: Med-Surg Status is: Observation  The patient will require care spanning > 2 midnights and should be moved to inpatient because: Hemodynamically unstable, IV treatments appropriate due to intensity of illness or inability to take PO, and Inpatient level of care appropriate due to severity of illness  Dispo: The patient is from: Home              Anticipated d/c is to: Home              Patient currently is not medically stable to d/c.   Difficult to place patient No       Consultants:  None   Sch Meds:  Scheduled Meds:  amiodarone  200 mg Oral Daily   apixaban  2.5 mg Oral BID   finasteride  5 mg Oral Daily   levothyroxine  25 mcg Oral Q0600   multivitamin with minerals  1 tablet Oral Daily   tamsulosin  0.4 mg Oral QHS   Continuous Infusions:  lactated ringers 75 mL/hr at 09/10/21 0642   PRN  Meds:.acetaminophen **OR** acetaminophen, meclizine, ondansetron **OR** ondansetron (ZOFRAN) IV  Antimicrobials: Anti-infectives (From admission, onward)    None        I have personally reviewed the following labs and images: CBC: Recent Labs  Lab 09/10/21 0205  WBC 15.6*  NEUTROABS 14.1*  HGB 16.1  HCT 49.2  MCV 100.2*  PLT 297   BMP &GFR Recent Labs  Lab 09/10/21 0149 09/10/21 0241  NA 139  --   K 4.2  --   CL 104  --   CO2 24  --   GLUCOSE 153*  --   BUN 19  --   CREATININE 1.76*  --   CALCIUM 9.2  --   MG  --  2.1   Estimated Creatinine Clearance: 30.2 mL/min (A) (by C-G formula based on SCr of 1.76 mg/dL (H)). Liver & Pancreas:  Recent Labs  Lab 09/10/21 0149  AST 29  ALT 19  ALKPHOS 59  BILITOT 0.9  PROT 7.1  ALBUMIN 3.6   Recent Labs  Lab 09/10/21 0149  LIPASE 26   No results for input(s): AMMONIA in the last 168 hours. Diabetic: No results for input(s): HGBA1C in the last 72 hours. No results for input(s): GLUCAP in the last 168 hours. Cardiac Enzymes: No results for input(s): CKTOTAL, CKMB, CKMBINDEX, TROPONINI in the last 168 hours. No results for input(s): PROBNP in the last 8760 hours. Coagulation Profile: No results for input(s): INR, PROTIME in the last 168 hours. Thyroid Function Tests: No results for input(s): TSH, T4TOTAL, FREET4, T3FREE, THYROIDAB in the last 72 hours. Lipid Profile: No results for input(s): CHOL, HDL, LDLCALC, TRIG, CHOLHDL, LDLDIRECT in the last 72 hours. Anemia Panel: No results for input(s): VITAMINB12, FOLATE, FERRITIN, TIBC, IRON, RETICCTPCT in the last 72 hours. Urine analysis:    Component Value Date/Time   COLORURINE YELLOW 09/10/2021 0449   APPEARANCEUR CLEAR 09/10/2021 0449   LABSPEC <1.005 (L) 09/10/2021 0449   PHURINE 5.0 09/10/2021 0449   GLUCOSEU >=500 (A) 09/10/2021 0449   HGBUR NEGATIVE 09/10/2021 0449   BILIRUBINUR NEGATIVE 09/10/2021 0449   KETONESUR NEGATIVE 09/10/2021 0449    PROTEINUR NEGATIVE 09/10/2021 0449   UROBILINOGEN 1.0 04/04/2015 2320   NITRITE NEGATIVE 09/10/2021 0449   LEUKOCYTESUR NEGATIVE 09/10/2021 0449   Sepsis Labs: Invalid input(s): PROCALCITONIN, [place]  Microbiology: Recent Results (from the past 240 hour(s))  Resp Panel by RT-PCR (Flu A&B, Covid) Nasopharyngeal Swab     Status: None   Collection Time: 09/10/21  2:37 AM   Specimen: Nasopharyngeal Swab; Nasopharyngeal(NP) swabs in vial transport medium  Result Value Ref Range Status   SARS Coronavirus 2 by RT PCR NEGATIVE NEGATIVE Final    Comment: (NOTE) SARS-CoV-2 target nucleic acids are NOT DETECTED.  The SARS-CoV-2 RNA is generally detectable in upper respiratory specimens during the acute phase of infection. The lowest concentration of SARS-CoV-2 viral copies this assay can detect is 138 copies/mL. A negative result does not preclude SARS-Cov-2 infection and should not be used as the sole basis for treatment or other patient management decisions. A negative result may occur with  improper specimen collection/handling, submission of specimen other than nasopharyngeal swab, presence of viral mutation(s) within the areas targeted by this assay, and inadequate number of viral copies(<138 copies/mL). A negative result must be combined with clinical observations, patient history, and epidemiological information. The expected result is Negative.  Fact Sheet for Patients:  EntrepreneurPulse.com.au  Fact Sheet for Healthcare Providers:  IncredibleEmployment.be  This test is no t yet approved or cleared by the [place] FDA and  has been authorized for detection and/or diagnosis of SARS-CoV-2 by FDA under an Emergency Use Authorization (EUA). This EUA will remain  in effect (meaning this test can be used) for the duration of the COVID-19 declaration under Section 564(b)(1) of the Act, 21 U.S.C.section 360bbb-3(b)(1), unless the  authorization is terminated  or revoked sooner.       Influenza A by PCR NEGATIVE NEGATIVE Final   Influenza B by PCR NEGATIVE NEGATIVE Final    Comment: (NOTE) The Xpert Xpress SARS-CoV-2/FLU/RSV plus assay is intended as an aid in the diagnosis of influenza from Nasopharyngeal swab specimens and should not be used as a sole basis for treatment. Nasal washings and aspirates are unacceptable for Xpert Xpress SARS-CoV-2/FLU/RSV testing.  Fact Sheet for Patients: EntrepreneurPulse.com.au  Fact Sheet for Healthcare Providers: IncredibleEmployment.be  This test is not yet approved or cleared by the [place] and has been authorized for detection and/or diagnosis of SARS-CoV-2 by FDA under an Emergency Use Authorization (EUA). This EUA will remain in effect (meaning this test can be used) for the duration of the COVID-19 declaration under Section 564(b)(1) of the Act, 21 U.S.C. section 360bbb-3(b)(1), unless the authorization is terminated or revoked.  Performed at [facility], [place] [address] [place], [place] [address]     Radiology Studies: DG Chest 2 View  Result Date: 09/10/2021 CLINICAL DATA:  Chest pain, shortness of breath EXAM: CHEST - 2 VIEW COMPARISON:  08/27/2021 FINDINGS: Left chest cardiac device and leads, unchanged. Status post median sternotomy. Heart at the upper limits normal in size. Low lung volumes. Left basilar opacity, which may represent atelectasis and/or scarring. No definite pleural effusion. Status post left shoulder arthroplasty. No acute osseous abnormality. IMPRESSION: Stable borderline cardiomegaly.  No acute cardiopulmonary process. Electronically Signed   By: Merilyn Baba M.D.   On: 09/10/2021 03:11   CT Angio Chest/Abd/Pel for Dissection W and/or Wo Contrast  Result Date: 09/10/2021 CLINICAL DATA:  Concern for aortic dissection. EXAM: CT ANGIOGRAPHY CHEST, ABDOMEN AND PELVIS TECHNIQUE:  Non-contrast CT of the chest was initially obtained. Multidetector CT imaging through the chest, abdomen and pelvis was performed using the standard protocol during bolus administration of intravenous contrast. Multiplanar reconstructed images and MIPs were obtained and reviewed to evaluate the vascular anatomy. CONTRAST:  86mL OMNIPAQUE IOHEXOL 350 MG/ML SOLN COMPARISON:  Chest CT dated 08/27/2021. FINDINGS: CTA CHEST FINDINGS Cardiovascular: Borderline cardiomegaly. Three-vessel coronary vascular calcification and postsurgical changes of CABG. No pericardial effusion. Moderate atherosclerotic calcification of the thoracic aorta. No aneurysmal dilatation or dissection. No periaortic fluid collection. The origins of the great vessels of the aortic arch appear patent. The central pulmonary arteries are unremarkable. Mediastinum/Nodes: No hilar or mediastinal adenopathy. The esophagus is grossly unremarkable. No mediastinal fluid collection. Lungs/Pleura: Minimal atelectasis/scarring in the lingula. No focal consolidation, pleural effusion, or pneumothorax. The central airways are patent. Musculoskeletal: Osteopenia with degenerative changes of the spine. Left shoulder arthroplasty. Left pectoral pacemaker device. No acute osseous pathology. Median sternotomy wires. Review of the MIP images confirms the above findings. CTA ABDOMEN AND PELVIS FINDINGS VASCULAR Aorta: Moderate atherosclerotic calcification of the abdominal aorta. No aneurysmal dilatation or dissection. Occlusion of the distal aorta status post prior aorto bi femoral bypass graft. The graft is patent. Celiac: Atherosclerotic calcification of the origin of the celiac axis. The celiac artery and its branches are patent. SMA: Patent without evidence of aneurysm, dissection, vasculitis or significant stenosis. Renals: Atherosclerotic calcification of the origins of the renal arteries. The renal arteries are patent. IMA: Not visualized. Inflow: Occlusion of  the native common iliac arteries with reconstitution of the flow in the internal and external iliac arteries. The internal and external iliac arteries and the limbs of the bypass graft are patent. Veins: No obvious venous abnormality within the limitations of this arterial phase study. Review of the MIP images confirms the above findings. NON-VASCULAR No intra-abdominal free air or free fluid. Hepatobiliary: Subcentimeter hypodense lesion in the dome of the liver is too small to characterize. No intrahepatic biliary ductal dilatation. Cholecystectomy. Pancreas: Unremarkable. No pancreatic ductal dilatation or surrounding inflammatory changes. Spleen: Normal in size without focal abnormality. Adrenals/Urinary Tract: The adrenal glands are unremarkable. Mild bilateral renal parenchyma atrophy. There is a 2.5 cm left renal inferior pole cyst. Mild bilateral renal parenchyma atrophy and areas  of scarring. There is no hydronephrosis on either side. Visualized ureters appear unremarkable. The urinary bladder is poorly visualized due to streak artifact caused by bilateral hip arthroplasties. Stomach/Bowel: There is sigmoid diverticulosis without active inflammatory changes. There is no bowel obstruction or active inflammation. There is loose stool within the colon. Appendectomy. Lymphatic: No adenopathy. Reproductive: The prostate gland is poorly visualized due to streak artifact. Other: None Musculoskeletal: Osteopenia with degenerative changes of the spine. Lower lumbar laminectomy. Bilateral hip arthroplasties. No acute osseous pathology. Review of the MIP images confirms the above findings. IMPRESSION: 1. No acute intrathoracic, abdominal, or pelvic pathology. No aortic dissection or aneurysm. 2. 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 The graft is patent. 3. Aortic Atherosclerosis (ICD10-I70.0). Electronically Signed   By: Anner Crete M.D.   On: 09/10/2021 03:43       Paton Crum T. [place]  If 7PM-7AM, please contact night-coverage [email or web address] 09/10/2021, 2:55 PM

## 2021-09-10 NOTE — ED Notes (Signed)
Pt got back from waiting room and needed to [place] [facility]. Pt had watery diarrhea. I introduced myself to pt. Pt AxO x4. Pt c/o 1 week of feeling like his head his spinning, intermittent abdominal pain and nausea with dry heaving. Says he has barely been able to keep anything down. He says nothing makes the spinning in his head better/worse.

## 2021-09-10 NOTE — ED Notes (Signed)
Report given to [place], RN

## 2021-09-10 NOTE — ED Triage Notes (Signed)
Brought to hospital for CP, N/V, abd pain, SOB started this morning. Woke from sleep with symptoms. Fever started today called PCP and was unable to be seen. Seen 3 weeks ago due to heart issues. CP radiates to back, rt shoulder, lt side of CP, intermittent sharp. Abd pain middle of abd  states it just hurts bad, defibrillator in place. C/o headache sharp all over

## 2021-09-10 NOTE — Progress Notes (Signed)
[place] Patient complaining of lower abdominal/bladder pain 7/10. Patient complaining of being unable to void.Patient bladder scanned for 54 cc of urine and no abdomen distention noted.Patient requesting in and out catheter and pain medication or threatening to leave AMA. 1950 Text paged MD.

## 2021-09-10 NOTE — Progress Notes (Signed)
Patient received to the unit. Patient is alert and oriented x4. Iv in place and running fluid. Skin assessment done with another nurse. Given instructions about call bell and phone. Bed in low position and call bell in reach.

## 2021-09-10 NOTE — ED Notes (Signed)
Pt using the urinal at this time.

## 2021-09-10 NOTE — Progress Notes (Signed)
HOSPITAL MEDICINE OVERNIGHT EVENT NOTE    Notified by nursing that patient is complaining of lower abdominal pain.  Patient states as needed Tylenol is not adequately controlling his pain.  Nursing reports that abdomen is soft and nontender.  Reports that bladder scan was performed revealing only 54 cc of urine in the bladder.  Patient reports that he typically self catheterizes himself at home and therefore we will go ahead and place order for resumption of this practice.  We will place an order for every 4 hours as needed which will give plenty flexibility and the frequency of catheterizations.  CT imaging of abdomen pelvis reveals no significant abnormality.  Urinalysis not particularly suggestive of an infection.  Will provide patient with a one-time dose of Percocet for pain relief and monitor.  Vernelle Emerald  MD [facility]

## 2021-09-10 NOTE — ED Notes (Signed)
Patient transported to X-ray 

## 2021-09-10 NOTE — ED Notes (Signed)
Pt ambulatory to bathroom

## 2021-09-10 NOTE — ED Provider Notes (Signed)
[facility] EMERGENCY DEPARTMENT Provider Note   CSN: [id number] Arrival date & time: 09/10/21  0125     History Chief Complaint  Patient presents with   Chest Pain   Nausea   Abdominal Pain    Ryan [name] is a 80 y.o. male.  Patient presents to the emergency department with a chief complaint of chest pain, abdominal pain, nausea, vomiting, and diarrhea.  Was recently admitted for CHF exacerbation.  States that since going home he has been having "stomach problems."  States that he has not been able to keep anything down.  He denies fevers or chills.  He states that he has not had a recent COVID test.  The history is provided by the patient. No language interpreter was used.      Past Medical History:  Diagnosis Date   Arthritis    CAD (coronary artery disease)    Carotid artery occlusion    Cataract    Bil eyes/worse in left eye   CHF (congestive heart failure) (HCC)    Chronic back pain    DVT (deep venous thrombosis) (HCC)    Dysrhythmia    Enlarged prostate    takes Rapaflo daily   GERD (gastroesophageal reflux disease)    occasional   History of colon polyps    History of gout    has colchicine prn   History of kidney stones    Hyperlipidemia    takes Crestor daily   Hypertension    takes Amlodipine daily   Hypothyroidism    Myocardial infarction [facility])    Peripheral vascular disease ([place])    Pulmonary emboli ([place]) 03/20/2015   elevated d-dimer, intermediate V/Q study, atypical chest pain and SOB. Start on Xarelto 20mg  BID for 3 month   Rapid atrial fibrillation (HCC)    Renal insufficiency    Shortness of breath dyspnea    Urinary frequency    Urinary urgency     Patient Active Problem List   Diagnosis Date Noted   Hypoxemia    CHF exacerbation ([place]) 03/23/2021   Acute exacerbation of CHF (congestive heart failure) ([place]) 03/22/2021   Status post implantation of automatic cardioverter/defibrillator (AICD) 03/22/2021    Ventricular tachycardia ([place]) 12/28/2020   CHF (congestive heart failure) ([place]) 12/28/2020   NSTEMI (non-ST elevated myocardial infarction) ([place])    Acute respiratory failure with hypoxia ([place])    S/P total hip arthroplasty 08/16/2019   Unilateral primary osteoarthritis, right hip 07/18/2019   PAF (paroxysmal atrial fibrillation) ([place]) 01/27/2019   Dizziness 01/27/2019   History of pneumonia 01/27/2019   CKD (chronic kidney disease) stage 3, GFR 30-59 ml/min (HCC) 01/27/2019   Chest pain 02/07/2018   Elevated troponin 03/23/2017   Sigmoid diverticulitis 03/23/2017   Acute pyelonephritis 03/23/2017   Acute kidney injury superimposed on chronic kidney disease ([place]) 03/23/2017   Wrist pain, acute, right    Subclinical hypothyroidism    Dyspnea 02/26/2017   Shoulder blade pain 02/26/2017   SOB (shortness of breath) 02/26/2017   Chronic right shoulder pain    Chronic combined systolic and diastolic CHF (congestive heart failure) ([place])    Hyperlipidemia LDL goal <70 01/20/2017   Chronic cholecystitis 07/27/2016   PAD (peripheral artery disease) ([place]) 07/09/2015   S/P CABG x 4 04/05/2015   Coronary artery disease involving native coronary artery with unstable angina pectoris ([place])    Atypical chest pain 03/20/2015   Carotid artery disease ([place]) 10/09/2014   PVD (peripheral vascular disease) ([place]) 10/09/2014  HTN (hypertension) 05/30/2014   Arthritis of left hip 01/09/2014   Status post THR (total hip replacement) 01/09/2014   Spinal stenosis, lumbar 01/06/2013    Class: Diagnosis of   Coronary artery disease 01/05/2013   Atherosclerosis of native artery of extremity with intermittent claudication ([place]) 10/18/2012    Past Surgical History:  Procedure Laterality Date   APPENDECTOMY     BACK SURGERY     5 times   big toe surgery     CARDIAC CATHETERIZATION     2010    dr Acie Fredrickson   cataract surgery     left eye   CHOLECYSTECTOMY N/A 07/27/2016   Procedure: LAPAROSCOPIC  CHOLECYSTECTOMY;  Surgeon: Mickeal Skinner, MD;  Location: [place];  Service: General;  Laterality: N/A;   COLONOSCOPY     CORONARY ARTERY BYPASS GRAFT N/A 04/05/2015   Procedure: CORONARY ARTERY BYPASS GRAFTING (CABG)X4 LIMA-LAD; SVG-DIAG1-DIAG2; SVG-PD;  Surgeon: [name], MD;  Location: [place];  Service: Open Heart Surgery;  Laterality: N/A;   CORONARY/GRAFT ANGIOGRAPHY N/A 04/22/2018   Procedure: CORONARY/GRAFT ANGIOGRAPHY;  Surgeon: Martinique, Peter M, MD;  Location: [place] CV LAB;  Service: Cardiovascular;  Laterality: N/A;   CYSTOSCOPY     ENDARTERECTOMY Left 04/24/2016   Procedure: ENDARTERECTOMY LEFT CAROTID;  Surgeon: Mal Misty, MD;  Location: [place];  Service: Vascular;  Laterality: Left;   EYE SURGERY     FEMORAL ARTERY - POPLITEAL ARTERY BYPASS GRAFT     ICD IMPLANT N/A 12/30/2020   Procedure: ICD IMPLANT;  Surgeon: Evans Lance, MD;  Location: [place] CV LAB;  Service: Cardiovascular;  Laterality: N/A;   JOINT REPLACEMENT     shoulder   LEFT HEART CATHETERIZATION WITH CORONARY ANGIOGRAM N/A 04/03/2015   Procedure: LEFT HEART CATHETERIZATION WITH CORONARY ANGIOGRAM;  Surgeon: Troy Sine, MD;  Location[facility] CATH LAB;  Service: Cardiovascular;  Laterality: N/A;   LUMBAR LAMINECTOMY  01/06/2013   Procedure: MICRODISCECTOMY LUMBAR LAMINECTOMY;  Surgeon: Marybelle Killings, MD;  Location: [place];  Service: Orthopedics;  Laterality: N/A;  L3-4 decompression   LUMBAR LAMINECTOMY/DECOMPRESSION MICRODISCECTOMY  02/12/2012   Procedure: LUMBAR LAMINECTOMY/DECOMPRESSION MICRODISCECTOMY;  Surgeon: Floyce Stakes, MD;  Location: [place] NEURO OR[facility];  Service: Neurosurgery;  Laterality: N/A;  Lumbar four-five laminectomy   PATCH ANGIOPLASTY Left 04/24/2016   Procedure: LEFT CAROTID ARTERY PATCH ANGIOPLASTY;  Surgeon: Mal Misty, MD;  Location: [place];  Service: Vascular;  Laterality: Left;   RIGHT HEART CATH AND CORONARY/GRAFT ANGIOGRAPHY N/A 01/30/2019   Procedure: RIGHT HEART CATH  AND CORONARY/GRAFT ANGIOGRAPHY;  Surgeon: Troy Sine, MD;  Location: [place] CV LAB;  Service: Cardiovascular;  Laterality: N/A;   STERIOD INJECTION Right 01/09/2014   Procedure: STEROID INJECTION;  Surgeon: Mcarthur Rossetti, MD;  Location: [place];  Service: Orthopedics;  Laterality: Right;   TEE WITHOUT CARDIOVERSION N/A 04/05/2015   Procedure: TRANSESOPHAGEAL ECHOCARDIOGRAM (TEE);  Surgeon: [name], MD;  Location: [place];  Service: Open Heart Surgery;  Laterality: N/A;   TOTAL HIP ARTHROPLASTY Left 01/09/2014   DR Ninfa Linden   TOTAL HIP ARTHROPLASTY Left 01/09/2014   Procedure: LEFT TOTAL HIP ARTHROPLASTY ANTERIOR APPROACH and Steroid Injection Right hip;  Surgeon: Mcarthur Rossetti, MD;  Location: [place];  Service: Orthopedics;  Laterality: Left;   TOTAL HIP ARTHROPLASTY Right 08/15/2019   TOTAL HIP ARTHROPLASTY Right 08/15/2019   Procedure: RIGHT TOTAL HIP ARTHROPLASTY ANTERIOR APPROACH;  Surgeon: Mcarthur Rossetti, MD;  Location: [place];  Service: Orthopedics;  Laterality: Right;       Family History  Problem Relation Age of Onset   Heart disease Father    Heart attack Father    Heart disease Sister    Hypertension Sister    Heart attack Sister    Hypertension Mother    Diabetes Son     Social History   Tobacco Use   Smoking status: Former    Types: Cigarettes    Quit date: 02/04/1987    Years since quitting: 34.6   Smokeless tobacco: Former    Types: Chew    Quit date: 07/20/2009   Tobacco comments:    quit 35+yrs ago  Vaping Use   Vaping Use: Never used  Substance Use Topics   Alcohol use: No    Alcohol/week: 0.0 standard drinks   Drug use: No    Home Medications Prior to Admission medications   Medication Sig Start Date End Date Taking? Authorizing Provider  allopurinol (ZYLOPRIM) 100 MG tablet Take 100 mg by mouth daily as needed (gout). 05/29/20   [provider]  amiodarone (PACERONE) 400 MG tablet Take 0.5 tablets (200  mg total) by mouth daily. 07/09/21   Evans Lance, MD  apixaban (ELIQUIS) 2.5 MG TABS tablet Take 1 tablet (2.5 mg total) by mouth 2 (two) times daily. 08/29/21 09/28/21  Kayleen Memos, DO  carvedilol (COREG) 3.125 MG tablet Take 1 tablet (3.125 mg total) by mouth 2 (two) times daily with a meal. 08/29/21 09/28/21  Kayleen Memos, DO  empagliflozin (JARDIANCE) 10 MG TABS tablet Take 1 tablet (10 mg total) by mouth daily. 08/30/21 09/29/21  Kayleen Memos, DO  Evolocumab (REPATHA SURECLICK) 209 MG/ML SOAJ Inject 1 pen into the skin every 14 (fourteen) days. 08/30/20   [name], [name], MD  finasteride (PROSCAR) 5 MG tablet Take 5 mg by mouth daily. 08/18/21   [provider]  furosemide (LASIX) 20 MG tablet Take 1 tablet (20 mg total) by mouth daily. 08/30/21 09/29/21  Kayleen Memos, DO  levothyroxine (SYNTHROID) 25 MCG tablet Take 25 mcg by mouth daily. 07/02/21   [provider]  meclizine (ANTIVERT) 25 MG tablet Take 25 mg by mouth every 8 (eight) hours as needed for dizziness. 07/30/21   [provider]  Multiple Vitamin (MULTIVITAMIN WITH MINERALS) TABS tablet Take 1 tablet by mouth daily. 08/30/21 09/29/21  Kayleen Memos, DO  nitroGLYCERIN (NITROSTAT) 0.4 MG SL tablet Place 1 tablet (0.4 mg total) under the tongue every 5 (five) minutes as needed for chest pain. 07/09/21   Evans Lance, MD  tamsulosin (FLOMAX) 0.4 MG CAPS capsule Take 0.4 mg by mouth at bedtime. 03/01/17   [provider]    Allergies    Zetia [ezetimibe], Codeine, and Unithroid [levothyroxine sodium]  Review of Systems   Review of Systems  All other systems reviewed and are negative.  Physical Exam Updated Vital Signs BP 118/86   Pulse (!) 59   Temp 99.3 F (37.4 C)   Resp 16   Ht 5\' 6"  (1.676 m)   Wt 65.8 kg   SpO2 100%   BMI 23.40 kg/m   Physical Exam Vitals and nursing note reviewed.  Constitutional:      Appearance: He is well-developed.  HENT:     Head: Normocephalic and  atraumatic.  Eyes:     Conjunctiva/sclera: Conjunctivae normal.  Cardiovascular:     Rate and Rhythm: Normal rate and regular rhythm.  Heart sounds: No murmur heard. Pulmonary:     Effort: Pulmonary effort is normal. No respiratory distress.     Breath sounds: Normal breath sounds.  Abdominal:     Palpations: Abdomen is soft.     Tenderness: There is no abdominal tenderness.  Musculoskeletal:        General: Normal range of motion.     Cervical back: Neck supple.  Skin:    General: Skin is warm and dry.  Neurological:     Mental Status: He is alert and oriented to person, place, and time.  Psychiatric:        Mood and Affect: Mood normal.        Behavior: Behavior normal.    ED Results / Procedures / Treatments   Labs (all labs ordered are listed, but only abnormal results are displayed) Labs Reviewed  CBC WITH DIFFERENTIAL/PLATELET - Abnormal; Notable for the following components:      Result Value   WBC 15.6 (*)    MCV 100.2 (*)    Neutro Abs 14.1 (*)    Abs Immature Granulocytes 0.09 (*)    All other components within normal limits  RESP PANEL BY RT-PCR (FLU A&B, COVID) ARPGX2  LIPASE, BLOOD  COMPREHENSIVE METABOLIC PANEL  URINALYSIS, ROUTINE W REFLEX MICROSCOPIC  BRAIN NATRIURETIC PEPTIDE  MAGNESIUM  TROPONIN I (HIGH SENSITIVITY)    EKG EKG Interpretation  Date/Time:  Wednesday September 10 2021 01:40:59 EDT Ventricular Rate:  75 PR Interval:    QRS Duration: 78 QT Interval:  452 QTC Calculation: 504 R Axis:   4 Text Interpretation: Atrial flutter with variable A-V block Nonspecific ST abnormality Prolonged QT Abnormal ECG Confirmed by Quintella Reichert [phone]) on 09/10/2021 2:18:14 AM  Radiology DG Chest 2 View  Result Date: 09/10/2021 CLINICAL DATA:  Chest pain, shortness of breath EXAM: CHEST - 2 VIEW COMPARISON:  08/27/2021 FINDINGS: Left chest cardiac device and leads, unchanged. Status post median sternotomy. Heart at the upper limits normal in  size. Low lung volumes. Left basilar opacity, which may represent atelectasis and/or scarring. No definite pleural effusion. Status post left shoulder arthroplasty. No acute osseous abnormality. IMPRESSION: Stable borderline cardiomegaly.  No acute cardiopulmonary process. Electronically Signed   By: Merilyn Baba M.D.   On: 09/10/2021 03:11   CT Angio Chest/Abd/Pel for Dissection W and/or Wo Contrast  Result Date: 09/10/2021 CLINICAL DATA:  Concern for aortic dissection. EXAM: CT ANGIOGRAPHY CHEST, ABDOMEN AND PELVIS TECHNIQUE: Non-contrast CT of the chest was initially obtained. Multidetector CT imaging through the chest, abdomen and pelvis was performed using the standard protocol during bolus administration of intravenous contrast. Multiplanar reconstructed images and MIPs were obtained and reviewed to evaluate the vascular anatomy. CONTRAST:  86mL OMNIPAQUE IOHEXOL 350 MG/ML SOLN COMPARISON:  Chest CT dated 08/27/2021. FINDINGS: CTA CHEST FINDINGS Cardiovascular: Borderline cardiomegaly. Three-vessel coronary vascular calcification and postsurgical changes of CABG. No pericardial effusion. Moderate atherosclerotic calcification of the thoracic aorta. No aneurysmal dilatation or dissection. No periaortic fluid collection. The origins of the great vessels of the aortic arch appear patent. The central pulmonary arteries are unremarkable. Mediastinum/Nodes: No hilar or mediastinal adenopathy. The esophagus is grossly unremarkable. No mediastinal fluid collection. Lungs/Pleura: Minimal atelectasis/scarring in the lingula. No focal consolidation, pleural effusion, or pneumothorax. The central airways are patent. Musculoskeletal: Osteopenia with degenerative changes of the spine. Left shoulder arthroplasty. Left pectoral pacemaker device. No acute osseous pathology. Median sternotomy wires. Review of the MIP images confirms the above findings. CTA ABDOMEN AND PELVIS FINDINGS  VASCULAR Aorta: Moderate  atherosclerotic calcification of the abdominal aorta. No aneurysmal dilatation or dissection. Occlusion of the distal aorta status post prior aorto bi femoral bypass graft. The graft is patent. Celiac: Atherosclerotic calcification of the origin of the celiac axis. The celiac artery and its branches are patent. SMA: Patent without evidence of aneurysm, dissection, vasculitis or significant stenosis. Renals: Atherosclerotic calcification of the origins of the renal arteries. The renal arteries are patent. IMA: Not visualized. Inflow: Occlusion of the native common iliac arteries with reconstitution of the flow in the internal and external iliac arteries. The internal and external iliac arteries and the limbs of the bypass graft are patent. Veins: No obvious venous abnormality within the limitations of this arterial phase study. Review of the MIP images confirms the above findings. NON-VASCULAR No intra-abdominal free air or free fluid. Hepatobiliary: Subcentimeter hypodense lesion in the dome of the liver is too small to characterize. No intrahepatic biliary ductal dilatation. Cholecystectomy. Pancreas: Unremarkable. No pancreatic ductal dilatation or surrounding inflammatory changes. Spleen: Normal in size without focal abnormality. Adrenals/Urinary Tract: The adrenal glands are unremarkable. Mild bilateral renal parenchyma atrophy. There is a 2.5 cm left renal inferior pole cyst. Mild bilateral renal parenchyma atrophy and areas of scarring. There is no hydronephrosis on either side. Visualized ureters appear unremarkable. The urinary bladder is poorly visualized due to streak artifact caused by bilateral hip arthroplasties. Stomach/Bowel: There is sigmoid diverticulosis without active inflammatory changes. There is no bowel obstruction or active inflammation. There is loose stool within the colon. Appendectomy. Lymphatic: No adenopathy. Reproductive: The prostate gland is poorly visualized due to streak artifact.  Other: None Musculoskeletal: Osteopenia with degenerative changes of the spine. Lower lumbar laminectomy. Bilateral hip arthroplasties. No acute osseous pathology. Review of the MIP images confirms the above findings. IMPRESSION: 1. No acute intrathoracic, abdominal, or pelvic pathology. No aortic dissection or aneurysm. 2. 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 The graft is patent. 3. Aortic Atherosclerosis (ICD10-I70.0). Electronically Signed   By: Anner Crete M.D.   On: 09/10/2021 03:43    Procedures Procedures   Medications Ordered in ED Medications  ondansetron (ZOFRAN-ODT) disintegrating tablet 4 mg (4 mg Oral Given 09/10/21 0151)    ED Course  I have reviewed the triage vital signs and the nursing notes.  Pertinent labs & imaging results that were available during my care of the patient were reviewed by me and considered in my medical decision making (see chart for details).    MDM Rules/Calculators/A&P                           Patient here with nausea and vomiting.  Reports persistent symptoms since being discharged from the hospital for CHF exacerbation.  States that he has not been able to keep anything down.  He denies any fever.  He reports some crampy abdominal pain.  Laboratory work-up notable for increased creatinine, 1.74 today. Moderate leukocytosis, perhaps from vomiting.    COVID test is negative.  Troponins are flat and near baseline.  BNP is elevated 852, improved from priors.  Chest x-ray shows no significant vascular congestion.  CTangio shows no evidence of dissection.  No obvious acute intrathoracic, abdominal, or pelvic pathology.  Patient seen by discussed with Dr. Ralene Bathe, who advised admission for intractable vomiting.  Thought to be over-diuresed.   Appreciate Dr. Alcario Drought for admitting. Final Clinical Impression(s) / ED Diagnoses Final diagnoses:  Intractable vomiting with nausea, unspecified vomiting  type  Elevated serum creatinine    Rx / DC Orders ED Discharge Orders     None        Montine Circle, PA-C 09/10/21 0518    Quintella Reichert, MD 09/10/21 2325

## 2021-09-10 NOTE — H&P (Signed)
History and Physical    Ryan [name]:[id number] DOB: 1941/10/07 DOA: 09/10/2021  PCP: Ginger Organ., MD  Patient coming from: Home  I have personally briefly reviewed patient's old medical records in [place]  Chief Complaint: CP, nausea  HPI: Ryan [name] is a 80 y.o. male with medical history significant of HTN, HLD, CAD s/p CABG and stents, HFrEF (30-35%), PAF on eliquis, VT s/p AICD.  Pt presents to the ED with c/o intractable n/v, dizziness.  Pt admitted for acute on chronic CHF from 9/7-9/9.  Since discharge he has had persistent dizziness.  On Friday last week he had severe R ear pain, hyperesthesia, but no rash, and this resolved after just 1 day.  Over the past couple of days he has had intractable N/V.  Unable to keep anything down.  No fevers, nor chills.  Recent COVID test at home neg.  Has had CP and abd pain associated with this.  Nothing seems to make better or worse.   ED Course: Pt hypotensive with SBP in the 90s initially.  Creat 1.7 up from 1.5 on discharge.  WBC 15.9k.  COVID and flu are neg.  Trops 42 and 40 (baseline for patient).  CTA chest, abd, pelvis - no acute findings  Pt put on IVF, hospitalist asked to admit.   Review of Systems: As per HPI, otherwise all review of systems negative.  Past Medical History:  Diagnosis Date   Arthritis    CAD (coronary artery disease)    Carotid artery occlusion    Cataract    Bil eyes/worse in left eye   CHF (congestive heart failure) (HCC)    Chronic back pain    DVT (deep venous thrombosis) (HCC)    Dysrhythmia    Enlarged prostate    takes Rapaflo daily   GERD (gastroesophageal reflux disease)    occasional   History of colon polyps    History of gout    has colchicine prn   History of kidney stones    Hyperlipidemia    takes Crestor daily   Hypertension    takes Amlodipine daily   Hypothyroidism    Myocardial infarction [facility])    Peripheral vascular  disease ([place])    Pulmonary emboli ([place]) 03/20/2015   elevated d-dimer, intermediate V/Q study, atypical chest pain and SOB. Start on Xarelto 20mg  BID for 3 month   Rapid atrial fibrillation ([place])    Renal insufficiency    Shortness of breath dyspnea    Urinary frequency    Urinary urgency     Past Surgical History:  Procedure Laterality Date   APPENDECTOMY     BACK SURGERY     5 times   big toe surgery     CARDIAC CATHETERIZATION     2010    dr Acie Fredrickson   cataract surgery     left eye   CHOLECYSTECTOMY N/A 07/27/2016   Procedure: LAPAROSCOPIC CHOLECYSTECTOMY;  Surgeon: Mickeal Skinner, MD;  Location: [place];  Service: General;  Laterality: N/A;   COLONOSCOPY     CORONARY ARTERY BYPASS GRAFT N/A 04/05/2015   Procedure: CORONARY ARTERY BYPASS GRAFTING (CABG)X4 LIMA-LAD; SVG-DIAG1-DIAG2; SVG-PD;  Surgeon: [name], MD;  Location: [place];  Service: Open Heart Surgery;  Laterality: N/A;   CORONARY/GRAFT ANGIOGRAPHY N/A 04/22/2018   Procedure: CORONARY/GRAFT ANGIOGRAPHY;  Surgeon: Martinique, Peter M, MD;  Location: [place] CV LAB;  Service: Cardiovascular;  Laterality: N/A;   CYSTOSCOPY  ENDARTERECTOMY Left 04/24/2016   Procedure: ENDARTERECTOMY LEFT CAROTID;  Surgeon: Mal Misty, MD;  Location: [place];  Service: Vascular;  Laterality: Left;   EYE SURGERY     FEMORAL ARTERY - POPLITEAL ARTERY BYPASS GRAFT     ICD IMPLANT N/A 12/30/2020   Procedure: ICD IMPLANT;  Surgeon: Evans Lance, MD;  Location: [place] CV LAB;  Service: Cardiovascular;  Laterality: N/A;   JOINT REPLACEMENT     shoulder   LEFT HEART CATHETERIZATION WITH CORONARY ANGIOGRAM N/A 04/03/2015   Procedure: LEFT HEART CATHETERIZATION WITH CORONARY ANGIOGRAM;  Surgeon: Troy Sine, MD;  Location: [facility] CATH LAB;  Service: Cardiovascular;  Laterality: N/A;   LUMBAR LAMINECTOMY  01/06/2013   Procedure: MICRODISCECTOMY LUMBAR LAMINECTOMY;  Surgeon: Marybelle Killings, MD;  Location: [place];  Service: Orthopedics;   Laterality: N/A;  L3-4 decompression   LUMBAR LAMINECTOMY/DECOMPRESSION MICRODISCECTOMY  02/12/2012   Procedure: LUMBAR LAMINECTOMY/DECOMPRESSION MICRODISCECTOMY;  Surgeon: Floyce Stakes, MD;  Location: [place] NEURO ORS;  Service: Neurosurgery;  Laterality: N/A;  Lumbar four-five laminectomy   PATCH ANGIOPLASTY Left 04/24/2016   Procedure: LEFT CAROTID ARTERY PATCH ANGIOPLASTY;  Surgeon: Mal Misty, MD;  Location: [place];  Service: Vascular;  Laterality: Left;   RIGHT HEART CATH AND CORONARY/GRAFT ANGIOGRAPHY N/A 01/30/2019   Procedure: RIGHT HEART CATH AND CORONARY/GRAFT ANGIOGRAPHY;  Surgeon: Troy Sine, MD;  Location: [place] CV LAB;  Service: Cardiovascular;  Laterality: N/A;   STERIOD INJECTION Right 01/09/2014   Procedure: STEROID INJECTION;  Surgeon: Mcarthur Rossetti, MD;  Location: [place];  Service: Orthopedics;  Laterality: Right;   TEE WITHOUT CARDIOVERSION N/A 04/05/2015   Procedure: TRANSESOPHAGEAL ECHOCARDIOGRAM (TEE);  Surgeon: [name], MD;  Location: [place];  Service: Open Heart Surgery;  Laterality: N/A;   TOTAL HIP ARTHROPLASTY Left 01/09/2014   DR Ninfa Linden   TOTAL HIP ARTHROPLASTY Left 01/09/2014   Procedure: LEFT TOTAL HIP ARTHROPLASTY ANTERIOR APPROACH and Steroid Injection Right hip;  Surgeon: Mcarthur Rossetti, MD;  Location: [place];  Service: Orthopedics;  Laterality: Left;   TOTAL HIP ARTHROPLASTY Right 08/15/2019   TOTAL HIP ARTHROPLASTY Right 08/15/2019   Procedure: RIGHT TOTAL HIP ARTHROPLASTY ANTERIOR APPROACH;  Surgeon: Mcarthur Rossetti, MD;  Location: [place];  Service: Orthopedics;  Laterality: Right;     reports that he quit smoking about 34 years ago. His smoking use included cigarettes. He quit smokeless tobacco use about 12 years ago.  His smokeless tobacco use included chew. He reports that he does not drink alcohol and does not use drugs.  Allergies  Allergen Reactions   Zetia [Ezetimibe] Other (See Comments)    Myalgias     Codeine Nausea And Vomiting        Unithroid [Levothyroxine Sodium] Other (See Comments)    Caused blurry vision per pt    Family History  Problem Relation Age of Onset   Heart disease Father    Heart attack Father    Heart disease Sister    Hypertension Sister    Heart attack Sister    Hypertension Mother    Diabetes Son      Prior to Admission medications   Medication Sig Start Date End Date Taking? Authorizing Provider  allopurinol (ZYLOPRIM) 100 MG tablet Take 100 mg by mouth daily as needed (gout). 05/29/20   [provider]  amiodarone (PACERONE) 400 MG tablet Take 0.5 tablets (200 mg total) by mouth daily. 07/09/21   Evans Lance, MD  apixaban (  ELIQUIS) 2.5 MG TABS tablet Take 1 tablet (2.5 mg total) by mouth 2 (two) times daily. 08/29/21 09/28/21  Kayleen Memos, DO  carvedilol (COREG) 3.125 MG tablet Take 1 tablet (3.125 mg total) by mouth 2 (two) times daily with a meal. 08/29/21 09/28/21  Kayleen Memos, DO  empagliflozin (JARDIANCE) 10 MG TABS tablet Take 1 tablet (10 mg total) by mouth daily. 08/30/21 09/29/21  Kayleen Memos, DO  Evolocumab (REPATHA SURECLICK) 967 MG/ML SOAJ Inject 1 pen into the skin every 14 (fourteen) days. 08/30/20   [name], [name], MD  finasteride (PROSCAR) 5 MG tablet Take 5 mg by mouth daily. 08/18/21   [provider]  furosemide (LASIX) 20 MG tablet Take 1 tablet (20 mg total) by mouth daily. 08/30/21 09/29/21  Kayleen Memos, DO  levothyroxine (SYNTHROID) 25 MCG tablet Take 25 mcg by mouth daily. 07/02/21   [provider]  meclizine (ANTIVERT) 25 MG tablet Take 25 mg by mouth every 8 (eight) hours as needed for dizziness. 07/30/21   [provider]  Multiple Vitamin (MULTIVITAMIN WITH MINERALS) TABS tablet Take 1 tablet by mouth daily. 08/30/21 09/29/21  Kayleen Memos, DO  nitroGLYCERIN (NITROSTAT) 0.4 MG SL tablet Place 1 tablet (0.4 mg total) under the tongue every 5 (five) minutes as needed for chest pain. 07/09/21    Evans Lance, MD  tamsulosin (FLOMAX) 0.4 MG CAPS capsule Take 0.4 mg by mouth at bedtime. 03/01/17   [provider]    Physical Exam: Vitals:   09/10/21 0245 09/10/21 0400 09/10/21 0430 09/10/21 0500  BP: 118/86 (!) 100/51 126/65 (!) 91/49  Pulse: (!) 59 63 87 (!) 51  Resp: 16 20 20 20   Temp:      SpO2: 100% 97% 99% 95%  Weight:      Height:        Constitutional: NAD, calm, comfortable Eyes: PERRL, lids and conjunctivae normal ENMT: No rash to R ear. Neck: normal, supple, no masses, no thyromegaly Respiratory: clear to auscultation bilaterally, no wheezing, no crackles. Normal respiratory effort. No accessory muscle use.  Cardiovascular: IRR, IRR Abdomen: no tenderness, no masses palpated. No hepatosplenomegaly. Bowel sounds positive.  Musculoskeletal: no clubbing / cyanosis. No joint deformity upper and lower extremities. Good ROM, no contractures. Normal muscle tone.  Skin: no rashes, lesions, ulcers. No induration Neurologic: CN 2-12 grossly intact. Sensation intact, DTR normal. Strength 5/5 in all 4.  Psychiatric: Normal judgment and insight. Alert and oriented x 3. Normal mood.    Labs on Admission: I have personally reviewed following labs and imaging studies  CBC: Recent Labs  Lab 09/10/21 0205  WBC 15.6*  NEUTROABS 14.1*  HGB 16.1  HCT 49.2  MCV 100.2*  PLT 591   Basic Metabolic Panel: Recent Labs  Lab 09/10/21 0149 09/10/21 0241  NA 139  --   K 4.2  --   CL 104  --   CO2 24  --   GLUCOSE 153*  --   BUN 19  --   CREATININE 1.76*  --   CALCIUM 9.2  --   MG  --  2.1   GFR: Estimated Creatinine Clearance: 30.2 mL/min (A) (by C-G formula based on SCr of 1.76 mg/dL (H)). Liver Function Tests: Recent Labs  Lab 09/10/21 0149  AST 29  ALT 19  ALKPHOS 59  BILITOT 0.9  PROT 7.1  ALBUMIN 3.6   Recent Labs  Lab 09/10/21 0149  LIPASE 26   No results for  input(s): AMMONIA in the last 168 hours. Coagulation Profile: No results for  input(s): INR, PROTIME in the last 168 hours. Cardiac Enzymes: No results for input(s): CKTOTAL, CKMB, CKMBINDEX, TROPONINI in the last 168 hours. BNP (last 3 results) No results for input(s): PROBNP in the last 8760 hours. HbA1C: No results for input(s): HGBA1C in the last 72 hours. CBG: No results for input(s): GLUCAP in the last 168 hours. Lipid Profile: No results for input(s): CHOL, HDL, LDLCALC, TRIG, CHOLHDL, LDLDIRECT in the last 72 hours. Thyroid Function Tests: No results for input(s): TSH, T4TOTAL, FREET4, T3FREE, THYROIDAB in the last 72 hours. Anemia Panel: No results for input(s): VITAMINB12, FOLATE, FERRITIN, TIBC, IRON, RETICCTPCT in the last 72 hours. Urine analysis:    Component Value Date/Time   COLORURINE YELLOW 09/10/2021 0449   APPEARANCEUR CLEAR 09/10/2021 0449   LABSPEC <1.005 (L) 09/10/2021 0449   PHURINE 5.0 09/10/2021 0449   GLUCOSEU >=500 (A) 09/10/2021 0449   HGBUR NEGATIVE 09/10/2021 0449   BILIRUBINUR NEGATIVE 09/10/2021 0449   KETONESUR NEGATIVE 09/10/2021 0449   PROTEINUR NEGATIVE 09/10/2021 0449   UROBILINOGEN 1.0 04/04/2015 2320   NITRITE NEGATIVE 09/10/2021 0449   LEUKOCYTESUR NEGATIVE 09/10/2021 0449    Radiological Exams on Admission: DG Chest 2 View  Result Date: 09/10/2021 CLINICAL DATA:  Chest pain, shortness of breath EXAM: CHEST - 2 VIEW COMPARISON:  08/27/2021 FINDINGS: Left chest cardiac device and leads, unchanged. Status post median sternotomy. Heart at the upper limits normal in size. Low lung volumes. Left basilar opacity, which may represent atelectasis and/or scarring. No definite pleural effusion. Status post left shoulder arthroplasty. No acute osseous abnormality. IMPRESSION: Stable borderline cardiomegaly.  No acute cardiopulmonary process. Electronically Signed   By: Merilyn Baba M.D.   On: 09/10/2021 03:11   CT Angio Chest/Abd/Pel for Dissection W and/or Wo Contrast  Result Date: 09/10/2021 CLINICAL DATA:  Concern for  aortic dissection. EXAM: CT ANGIOGRAPHY CHEST, ABDOMEN AND PELVIS TECHNIQUE: Non-contrast CT of the chest was initially obtained. Multidetector CT imaging through the chest, abdomen and pelvis was performed using the standard protocol during bolus administration of intravenous contrast. Multiplanar reconstructed images and MIPs were obtained and reviewed to evaluate the vascular anatomy. CONTRAST:  67mL OMNIPAQUE IOHEXOL 350 MG/ML SOLN COMPARISON:  Chest CT dated 08/27/2021. FINDINGS: CTA CHEST FINDINGS Cardiovascular: Borderline cardiomegaly. Three-vessel coronary vascular calcification and postsurgical changes of CABG. No pericardial effusion. Moderate atherosclerotic calcification of the thoracic aorta. No aneurysmal dilatation or dissection. No periaortic fluid collection. The origins of the great vessels of the aortic arch appear patent. The central pulmonary arteries are unremarkable. Mediastinum/Nodes: No hilar or mediastinal adenopathy. The esophagus is grossly unremarkable. No mediastinal fluid collection. Lungs/Pleura: Minimal atelectasis/scarring in the lingula. No focal consolidation, pleural effusion, or pneumothorax. The central airways are patent. Musculoskeletal: Osteopenia with degenerative changes of the spine. Left shoulder arthroplasty. Left pectoral pacemaker device. No acute osseous pathology. Median sternotomy wires. Review of the MIP images confirms the above findings. CTA ABDOMEN AND PELVIS FINDINGS VASCULAR Aorta: Moderate atherosclerotic calcification of the abdominal aorta. No aneurysmal dilatation or dissection. Occlusion of the distal aorta status post prior aorto bi femoral bypass graft. The graft is patent. Celiac: Atherosclerotic calcification of the origin of the celiac axis. The celiac artery and its branches are patent. SMA: Patent without evidence of aneurysm, dissection, vasculitis or significant stenosis. Renals: Atherosclerotic calcification of the origins of the renal  arteries. The renal arteries are patent. IMA: Not visualized. Inflow: Occlusion of the native common iliac  arteries with reconstitution of the flow in the internal and external iliac arteries. The internal and external iliac arteries and the limbs of the bypass graft are patent. Veins: No obvious venous abnormality within the limitations of this arterial phase study. Review of the MIP images confirms the above findings. NON-VASCULAR No intra-abdominal free air or free fluid. Hepatobiliary: Subcentimeter hypodense lesion in the dome of the liver is too small to characterize. No intrahepatic biliary ductal dilatation. Cholecystectomy. Pancreas: Unremarkable. No pancreatic ductal dilatation or surrounding inflammatory changes. Spleen: Normal in size without focal abnormality. Adrenals/Urinary Tract: The adrenal glands are unremarkable. Mild bilateral renal parenchyma atrophy. There is a 2.5 cm left renal inferior pole cyst. Mild bilateral renal parenchyma atrophy and areas of scarring. There is no hydronephrosis on either side. Visualized ureters appear unremarkable. The urinary bladder is poorly visualized due to streak artifact caused by bilateral hip arthroplasties. Stomach/Bowel: There is sigmoid diverticulosis without active inflammatory changes. There is no bowel obstruction or active inflammation. There is loose stool within the colon. Appendectomy. Lymphatic: No adenopathy. Reproductive: The prostate gland is poorly visualized due to streak artifact. Other: None Musculoskeletal: Osteopenia with degenerative changes of the spine. Lower lumbar laminectomy. Bilateral hip arthroplasties. No acute osseous pathology. Review of the MIP images confirms the above findings. IMPRESSION: 1. No acute intrathoracic, abdominal, or pelvic pathology. No aortic dissection or aneurysm. 2. 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 The graft is patent. 3. Aortic Atherosclerosis  (ICD10-I70.0). Electronically Signed   By: Anner Crete M.D.   On: 09/10/2021 03:43    EKG: Independently reviewed.  Assessment/Plan Principal Problem:   Acute kidney injury superimposed on chronic kidney disease (HCC) Active Problems:   HTN (hypertension)   Chronic combined systolic and diastolic CHF (congestive heart failure) (HCC)   PAF (paroxysmal atrial fibrillation) (HCC)   CKD (chronic kidney disease) stage 3, GFR 30-59 ml/min (HCC)   Intractable nausea and vomiting    AKI on CKD 3 - Likely pre-renal due to dehydration from N/V, also diuretics Hold diuretics IVF: 1L bolus in ED and LR at 75 Strict intake and output Repeat BMP in AM Does have urinary retention when ever he comes to hospital he says, and always needs a foley. Will go ahead and order foley Intractable N/V - Secondary to vertigo? CTA chest/abd/pelvis was unimpressive Hydration Zofran PRN nausea Try to advance to clear liquids as able Vertigo - Given persistent vertigo, ordering CT head to make sure no acute findings. PRN meclizine If work up neg and still persistent: can consider MRI, but does have defib.  Was just put in Jan of this year, so is PROBABLY MRI compatible (need to check for sure). HTN - Hold home BP meds given hypotension in ED CHF - Hold diuretics Gentle hydration PAF - Holding coreg Cont eliquis  DVT prophylaxis: Eliquis Code Status: Full Family Communication: No family in room Disposition Plan: Home after tolerating POs Consults called: None Admission status: Place in 93     Klarisa Barman, [place] [facility]  How to contact the [facility] Attending or Consulting provider [place] or covering provider during after hours [place], for this patient?  Check the care team in [facility] and look for a) attending/consulting [facility] provider listed and b) the [facility] team listed Log into [email or web address]  Amion Physician Scheduling and messaging for groups and whole hospitals  On call and  physician scheduling software for group practices, residents, hospitalists and other  medical providers for call, clinic, rotation and shift schedules. On[employer] is a hospital-wide system for scheduling doctors and paging doctors on call. EasyPlot is for scientific plotting and data analysis.  [email or web address]  and use [place]'s universal password to access. If you do not have the password, please contact the hospital operator.  Locate the [facility], The provider you are looking for under [facility] and page to a number that you can be directly reached. If you still have difficulty reaching the provider, please page the [facility] (Director on Call) for the Hospitalists listed on amion for assistance.  09/10/2021, 6:15 AM

## 2021-09-10 NOTE — Evaluation (Signed)
Physical Therapy Evaluation Patient Details Name: Ryan [name] MRN: [id number] DOB: 1941/12/05 Today's Date: 09/10/2021  History of Present Illness  Pt is an 80 y/o male admitted 9/21 secondary to nausea/vomiting and dizziness. PMH includes HTN, CHF, CKD, CAD s/p CABG, PE, PVD, and R TKA.  Clinical Impression  Pt admitted secondary to problem above with deficits below. Pt requiring min guard A for mobility tasks this session. Mild dizziness reported upon sitting, but no nystagmus noted and resolved quickly. BP WFL. Did not report any dizziness upon return to supine or with rolling and no nystagmus noted with those change in positions either. Would likely benefit from further vestibular follow up to address dizziness. Will continue to follow acutely to maximize functional mobility independence and safety.        Recommendations for follow up therapy are one component of a multi-disciplinary discharge planning process, led by the attending physician.  Recommendations may be updated based on patient status, additional functional criteria and insurance authorization.  Follow Up Recommendations Other (comment) (May benefit from vestibular outpatient PT)    Equipment Recommendations  None recommended by PT    Recommendations for Other Services       Precautions / Restrictions Precautions Precautions: Fall Restrictions Weight Bearing Restrictions: No      Mobility  Bed Mobility Overal bed mobility: Needs Assistance Bed Mobility: Supine to Sit;Sit to Supine     Supine to sit: Min guard Sit to supine: Supervision   General bed mobility comments: Min guard for safety to come to sitting. Mild dizziness reported, but did not note nystagmus. BP WFL. Pt did not report dizziness with rolling in bed or with return to supine. No nystagmus noted with those motions either.    Transfers Overall transfer level: Needs assistance Equipment used: 1 person hand held assist Transfers: Sit  to/from Stand Sit to Stand: Min guard         General transfer comment: Min guard for safety.  Ambulation/Gait Ambulation/Gait assistance: Min guard Gait Distance (Feet): 5 Feet Assistive device: 1 person hand held assist       General Gait Details: Took side steps at EOB with min guard A. Very slow, and guarded. No overt LOB noted. Pt with headache so wanting to defer further mobility.  Stairs            Wheelchair Mobility    Modified Rankin (Stroke Patients Only)       Balance Overall balance assessment: Mild deficits observed, not formally tested                                           Pertinent Vitals/Pain Pain Assessment: Faces Faces Pain Scale: Hurts little more Pain Location: headache Pain Descriptors / Indicators: Headache Pain Intervention(s): Limited activity within patient's tolerance;Monitored during session;Repositioned    Home Living Family/patient expects to be discharged to:: Private residence Living Arrangements: Spouse/significant other Available Help at Discharge: Family;Available 24 hours/day Type of Home: House Home Access: Stairs to enter Entrance Stairs-Rails: Right Entrance Stairs-Number of Steps: 2 Home Layout: Two level;Able to live on main level with bedroom/bathroom Home Equipment: Gilford Rile - 2 wheels;Cane - single point;Walker - 4 wheels      Prior Function Level of Independence: Independent               Hand Dominance        Extremity/Trunk Assessment  Upper Extremity Assessment Upper Extremity Assessment: Defer to OT evaluation    Lower Extremity Assessment Lower Extremity Assessment: Generalized weakness    Cervical / Trunk Assessment Cervical / Trunk Assessment: Kyphotic  Communication   Communication: HOH  Cognition Arousal/Alertness: Awake/alert Behavior During Therapy: WFL for tasks assessed/performed Overall Cognitive Status: Within Functional Limits for tasks assessed                                         General Comments      Exercises     Assessment/Plan    PT Assessment Patient needs continued PT services  PT Problem List Decreased strength;Decreased activity tolerance;Decreased balance;Decreased mobility;Decreased knowledge of precautions       PT Treatment Interventions DME instruction;Gait training;Stair training;Functional mobility training;Therapeutic activities;Therapeutic exercise;Balance training;Patient/family education    PT Goals (Current goals can be found in the Care Plan section)  Acute Rehab PT Goals Patient Stated Goal: to feel better PT Goal Formulation: With patient Time For Goal Achievement: 09/24/21 Potential to Achieve Goals: Good    Frequency Min 3X/week   Barriers to discharge        Co-evaluation               AM-PAC PT "6 Clicks" Mobility  Outcome Measure Help needed turning from your back to your side while in a flat bed without using bedrails?: None Help needed moving from lying on your back to sitting on the side of a flat bed without using bedrails?: A Little Help needed moving to and from a bed to a chair (including a wheelchair)?: A Little Help needed standing up from a chair using your arms (e.g., wheelchair or bedside chair)?: A Little Help needed to walk in hospital room?: A Little Help needed climbing 3-5 steps with a railing? : A Little 6 Click Score: 19    End of Session Equipment Utilized During Treatment: Gait belt Activity Tolerance: Patient limited by pain Patient left: in bed;with call bell/phone within reach;with family/visitor present (on stretcher in ED) Nurse Communication: Mobility status PT Visit Diagnosis: Other abnormalities of gait and mobility (R26.89);Dizziness and giddiness (R42)    Time: 7195-9747 PT Time Calculation (min) (ACUTE ONLY): 24 min   Charges:   PT Evaluation $PT Eval Low Complexity: 1 Low PT Treatments $Therapeutic Activity: 8-22 mins         Lou Miner, DPT  Acute Rehabilitation Services  Pager: [phone] Office: [phone]   Rudean Hitt 09/10/2021, 11:19 AM

## 2021-09-11 ENCOUNTER — Observation Stay (HOSPITAL_COMMUNITY): Payer: Medicare Other

## 2021-09-11 DIAGNOSIS — R42 Dizziness and giddiness: Secondary | ICD-10-CM

## 2021-09-11 DIAGNOSIS — I10 Essential (primary) hypertension: Secondary | ICD-10-CM | POA: Diagnosis not present

## 2021-09-11 DIAGNOSIS — R634 Abnormal weight loss: Secondary | ICD-10-CM | POA: Diagnosis not present

## 2021-09-11 DIAGNOSIS — N1831 Chronic kidney disease, stage 3a: Secondary | ICD-10-CM | POA: Diagnosis not present

## 2021-09-11 DIAGNOSIS — N179 Acute kidney failure, unspecified: Secondary | ICD-10-CM | POA: Diagnosis not present

## 2021-09-11 DIAGNOSIS — I480 Paroxysmal atrial fibrillation: Secondary | ICD-10-CM | POA: Diagnosis not present

## 2021-09-11 DIAGNOSIS — H814 Vertigo of central origin: Secondary | ICD-10-CM | POA: Diagnosis not present

## 2021-09-11 DIAGNOSIS — N189 Chronic kidney disease, unspecified: Secondary | ICD-10-CM | POA: Diagnosis not present

## 2021-09-11 DIAGNOSIS — R112 Nausea with vomiting, unspecified: Secondary | ICD-10-CM | POA: Diagnosis not present

## 2021-09-11 DIAGNOSIS — I959 Hypotension, unspecified: Secondary | ICD-10-CM | POA: Diagnosis not present

## 2021-09-11 DIAGNOSIS — I5042 Chronic combined systolic (congestive) and diastolic (congestive) heart failure: Secondary | ICD-10-CM | POA: Diagnosis not present

## 2021-09-11 LAB — CBC
HCT: 38.6 % — ABNORMAL LOW (ref 39.0–52.0)
Hemoglobin: 12.7 g/dL — ABNORMAL LOW (ref 13.0–17.0)
MCH: 32.7 pg (ref 26.0–34.0)
MCHC: 32.9 g/dL (ref 30.0–36.0)
MCV: 99.5 fL (ref 80.0–100.0)
Platelets: 194 10*3/uL (ref 150–400)
RBC: 3.88 MIL/uL — ABNORMAL LOW (ref 4.22–5.81)
RDW: 13.7 % (ref 11.5–15.5)
WBC: 7.3 10*3/uL (ref 4.0–10.5)
nRBC: 0 % (ref 0.0–0.2)

## 2021-09-11 LAB — RENAL FUNCTION PANEL
Albumin: 2.6 g/dL — ABNORMAL LOW (ref 3.5–5.0)
Anion gap: 7 (ref 5–15)
BUN: 19 mg/dL (ref 8–23)
CO2: 24 mmol/L (ref 22–32)
Calcium: 8.1 mg/dL — ABNORMAL LOW (ref 8.9–10.3)
Chloride: 107 mmol/L (ref 98–111)
Creatinine, Ser: 1.49 mg/dL — ABNORMAL HIGH (ref 0.61–1.24)
GFR, Estimated: 47 mL/min — ABNORMAL LOW (ref >60)
Glucose, Bld: 93 mg/dL (ref 70–99)
Phosphorus: 3.4 mg/dL (ref 2.5–4.6)
Potassium: 4.4 mmol/L (ref 3.5–5.1)
Sodium: 138 mmol/L (ref 135–145)

## 2021-09-11 LAB — TSH: TSH: 5.069 u[IU]/mL — ABNORMAL HIGH (ref 0.350–4.500)

## 2021-09-11 LAB — VITAMIN B12: Vitamin B-12: 266 pg/mL (ref 180–914)

## 2021-09-11 LAB — HEMOGLOBIN A1C
Hgb A1c MFr Bld: 6 % — ABNORMAL HIGH (ref 4.8–5.6)
Mean Plasma Glucose: 126 mg/dL

## 2021-09-11 LAB — CORTISOL-AM, BLOOD: Cortisol - AM: 6.5 ug/dL — ABNORMAL LOW (ref 6.7–22.6)

## 2021-09-11 LAB — BRAIN NATRIURETIC PEPTIDE: B Natriuretic Peptide: 853.2 pg/mL — ABNORMAL HIGH (ref 0.0–100.0)

## 2021-09-11 LAB — MAGNESIUM: Magnesium: 2.2 mg/dL (ref 1.7–2.4)

## 2021-09-11 MED ORDER — AMIODARONE HCL 200 MG PO TABS
200.0000 mg | ORAL_TABLET | Freq: Every day | ORAL | 1 refills | Status: DC
Start: 2021-09-11 — End: 2022-06-15

## 2021-09-11 NOTE — Progress Notes (Signed)
AVS reviewed with patient/family. All questions answered at this time. Transportation provided by family.

## 2021-09-11 NOTE — Progress Notes (Signed)
Physical Therapy Treatment Patient Details Name: Ryan [name] MRN: [id number] DOB: 26-Sep-1941 Today's Date: 09/11/2021   History of Present Illness Pt is an 80 y/o male admitted 9/21 secondary to nausea/vomiting and dizziness. PMH includes HTN, CHF, CKD, CAD s/p CABG, PE, PVD, and R TKA.    PT Comments    Pt tolerates treatment well, reporting only very brief dizziness with head impulse test to R side which resolves almost instantaneously and is different than pt's reported symptoms at the time of admission. PT notes no nystagmus and pt denies dizziness with the remainder of vestibular assessment and mobility during session. Pt is able to perform all mobility without physical assistance and is eager to discharge home. PT recommends no PT services at the time of discharge.   Recommendations for follow up therapy are one component of a multi-disciplinary discharge planning process, led by the attending physician.  Recommendations may be updated based on patient status, additional functional criteria and insurance authorization.  Follow Up Recommendations  No PT follow up     Equipment Recommendations  None recommended by PT    Recommendations for Other Services       Precautions / Restrictions Precautions Precautions: Fall Restrictions Weight Bearing Restrictions: No     Mobility  Bed Mobility Overal bed mobility: Independent Bed Mobility: Sidelying to Sit;Rolling;Sit to Sidelying Rolling: Independent Sidelying to sit: Independent     Sit to sidelying: Independent General bed mobility comments: pt denies symptoms of dizziness with mobility    Transfers Overall transfer level: Independent Equipment used: None Transfers: Sit to/from Stand Sit to Stand: Independent            Ambulation/Gait Ambulation/Gait assistance: Supervision Gait Distance (Feet): 200 Feet Assistive device: None Gait Pattern/deviations: Step-through pattern Gait velocity:  functional Gait velocity interpretation: 1.31 - 2.62 ft/sec, indicative of limited community ambulator General Gait Details: pt with steady step-through gait, reduced stance time on RLE due to hip pain   Stairs             Wheelchair Mobility    Modified Rankin (Stroke Patients Only)       Balance Overall balance assessment: Independent                                          Cognition Arousal/Alertness: Awake/alert Behavior During Therapy: WFL for tasks assessed/performed Overall Cognitive Status: Within Functional Limits for tasks assessed                                        Exercises      General Comments General comments (skin integrity, edema, etc.): VSS on RA. Head impulse test left negative, VOR negative bilaterally, pursuits negative. Pt reports brief dizziness with head impulse test to R side, symptoms resolve within 2 seconds of maneuver, no nystagmus noted. No skew noted. Pt denies symptoms of dizziness with all functional mobility tasks.      Pertinent Vitals/Pain Pain Assessment: Faces Faces Pain Scale: Hurts little more Pain Location: R hip Pain Descriptors / Indicators: Aching Pain Intervention(s): Monitored during session    Home Living                      Prior Function  PT Goals (current goals can now be found in the care plan section) Acute Rehab PT Goals Patient Stated Goal: to feel better Progress towards PT goals: Progressing toward goals    Frequency    Min 3X/week      PT Plan Current plan remains appropriate    Co-evaluation              AM-PAC PT "6 Clicks" Mobility   Outcome Measure  Help needed turning from your back to your side while in a flat bed without using bedrails?: None Help needed moving from lying on your back to sitting on the side of a flat bed without using bedrails?: None Help needed moving to and from a bed to a chair (including a  wheelchair)?: None Help needed standing up from a chair using your arms (e.g., wheelchair or bedside chair)?: None Help needed to walk in hospital room?: A Little Help needed climbing 3-5 steps with a railing? : A Little 6 Click Score: 22    End of Session   Activity Tolerance: Patient tolerated treatment well Patient left: in chair;with call bell/phone within reach Nurse Communication: Mobility status PT Visit Diagnosis: Other abnormalities of gait and mobility (R26.89);Dizziness and giddiness (R42)     Time: 7482-7078 PT Time Calculation (min) (ACUTE ONLY): 26 min  Charges:  $Gait Training: 8-22 mins $Therapeutic Activity: 8-22 mins                     Zenaida Niece, PT, DPT Acute Rehabilitation Pager: [phone]    Zenaida Niece 09/11/2021, 11:08 AM

## 2021-09-11 NOTE — Discharge Summary (Signed)
Physician Discharge Summary  Ryan [name] WSF:[id number] DOB: [birth year] DOA: 09/10/2021  PCP: Ginger Organ., MD  Admit date: 09/10/2021 Discharge date: 09/11/2021 Admitted From: Home Disposition: Home Recommendations for Outpatient Follow-up:  Follow ups as below. Please obtain CBC/BMP/Mag at follow up Please follow up on the following pending results: [place]: Overall PT Equipment/Devices: Not indicated Discharge Condition: Stable CODE STATUS: Full code  Follow-up Information     [facility]. Schedule an appointment as soon as possible for a visit.   Specialty: Rehabilitation Contact information: [address] [place]        Ginger Organ., MD. Schedule an appointment as soon as possible for a visit in 1 week(s).   Specialty: Internal Medicine Contact information: [place] [address] [phone]         [name], [name], MD. Schedule an appointment as soon as possible for a visit in 2 week(s).   Specialty: Cardiology Contact information: [address] [place] [address] [phone]                Hospital Course: 80 year old male with HTN, systolic CHF, CKD, CAD s/p CABG, PE, PVD, occluded distal AA s/p aorto bi femoral bypass graft, vertigo?,  VT s/p AICD, R TKA and recent hospitalization for CHF exacerbation from 9/7-9/8 returning with nausea, vomiting, worsening dizziness, chest pain and abdominal pain for about 2 days, and admitted for AKI, vertigo, nausea and vomiting.  SBP 90s.  CXR, CTA chest/abdomen/pelvis without acute finding.  WBC 16.  Troponin flat at 40s.  BNP 852 (lower than prior values).    Patient's blood pressure remained low despite IV fluid.  Eventually, started on low-dose midodrine.  On the day of discharge, patient's symptoms and hypotension resolved.  He felt well and  ready to go home.  Therapy recommended outpatient PT. He is discharged on p.o. midodrine 5 mg 3 times daily.  Advised to hold his torsemide on the days he has nausea, vomiting or poor p.o. intake, and take additional dose as needed for leg swelling, shortness of breath or weight gain.  Of note, patient reports significant weight loss due to poor appetite and poor p.o. intake.  He has been encouraged to take his multivitamins.  CT chest/abdomen/pelvis did not reveal significant finding.  Consider outpatient referral to GI if no improvement.  Patient's a.m. cortisol was low at 6.5.  ACTH ordered and pending at the time of discharge.  See individual problem list below for more on hospital course.  Discharge Diagnoses:  AKI on CKD-3A: Likely prerenal from poor p.o. intake and diuretics.  AKI resolving. Recent Labs    01/15/21 1148 02/07/21 0934 03/22/21 0004 03/23/21 0037 08/27/21 1105 08/28/21 0046 08/29/21 0022 09/02/21 1402 09/10/21 0149 09/11/21 0341  BUN 25 22 19 20 17 20  31* 23 19 19   CREATININE 1.54* 1.25 1.52* 1.43* 1.43* 1.51* 1.42* 1.49* 1.76* 1.49*  -Continue low-dose midodrine -Counseled on adjusting his diuretics as above -Recheck renal function and electrolytes at follow-up   Intractable nausea and vomiting-unclear etiology but resolved.  Abdominal exam benign.  CT chest/abdomen and pelvis without significant finding.  He seems to have vertigo.    Acute on chronic vertigo-his neuro exam is reassuring.  No nystagmus or cerebellar deficit. -Outpatient follow-up for vestibular PT -Discontinued his meclizine since his vertigo resolved without it.   Chronic systolic CHF: TTE on 9/8 with LVEF of  50 to 55% (previously 35 to 40%), RWMA (new), indeterminate DD, moderate AS and small mobile equal density adjacent to RV pacemaker lead.  Appears hypovolemic on exam.  See XR and CT without acute finding.  BNP lower than baseline. -Advised to adjust his torsemide as above. -Continue  low-dose Coreg   History of CAD/CABG: Denies chest pain -Continue low-dose Coreg   Mildly elevated troponin-likely due to poor renal clearance.  Remained flat in 42s.   Paroxysmal A. Fib/history of VT s/p AICD: Rate controlled.  On low-dose Coreg and low-dose Eliquis -Discharged on home amiodarone, low-dose Coreg and low-dose Eliquis.   Hypotension/history of hypertension: Hypotension resolved. -Continue midodrine 5 mg 3 times daily.   BPH with LUTS-he reports sense of incomplete void.  Reports I&O cath at home -Continue Flomax -Outpatient follow-up with urology as previously planned   Poor oral intake/unintentional weight loss-last colonoscopy in 2014 sessile polyps (resected), moderate diverticulosis and internal hemorrhoid.  CT chest, abdomen and pelvis without significant finding.    Wt Readings from Last 10 Encounters:  09/10/21 65.8 kg  09/02/21 68.8 kg  08/29/21 66.5 kg  05/20/21 68.8 kg  03/23/21 69.3 kg  03/17/21 73.4 kg  01/15/21 68.5 kg  12/31/20 70 kg  07/29/20 73.4 kg  01/18/20 76.7 kg  -Continue multivitamin -Consider outpatient follow-up with gastroenterology   Body mass index is 23.4 kg/m. Body mass index is 23.87 kg/m.           Discharge Exam: Vitals:   09/10/21 1614 09/10/21 1941 09/10/21 2349 09/11/21 0353  BP: (!) 113/57 (!) 141/72 99/65 (!) 126/59  Pulse: (!) 54 89 63 61  Temp: 98 F (36.7 C) 97.6 F (36.4 C) 97.8 F (36.6 C) 97.6 F (36.4 C)  Resp: 16 20 20 20   Height:      Weight:    67.1 kg  SpO2: 100% 98% 97% 96%  TempSrc: Oral Oral Oral Oral  BMI (Calculated):    23.88     GENERAL: Frail looking elderly male.  No apparent distress.  Ambulating in hallway with PT without assistance HEENT: MMM.  Vision and hearing grossly intact.  NECK: Supple.  No apparent JVD.  RESP: 98% on RA.  No IWOB.  Fair aeration bilaterally. CVS:  RRR. Heart sounds normal.  ABD/GI/GU: Bowel sounds present. Soft. Non tender.  MSK/EXT:  Moves  extremities. No apparent deformity. No edema.  SKIN: no apparent skin lesion or wound NEURO: Awake, alert and oriented appropriately. Speech clear. Cranial nerves II-XII  intact.  No nystagmus.  Motor 5/5 in all muscle groups of UE and LE bilaterally, Normal tone. Light sensation intact in all dermatomes of upper and lower ext bilaterally. Patellar reflex symmetric.  No pronator drift.  Finger to nose intact. PSYCH: Calm. Normal affect.   Discharge Instructions  Discharge Instructions     (HEART FAILURE PATIENTS) Call MD:  Anytime you have any of the following symptoms: 1) 3 pound weight gain in 24 hours or 5 pounds in 1 week 2) shortness of breath, with or without a dry hacking cough 3) swelling in the hands, feet or stomach 4) if you have to sleep on extra pillows at night in order to breathe.   Complete by: As directed    Ambulatory referral to Physical Therapy   Complete by: As directed    Iontophoresis - 4 mg/ml of dexamethasone: No   T.E.N.S. Unit Evaluation and Dispense as Indicated: No   Call MD for:  difficulty breathing, headache or visual  disturbances   Complete by: As directed    Call MD for:  extreme fatigue   Complete by: As directed    Call MD for:  persistant dizziness or light-headedness   Complete by: As directed    Call MD for:  persistant nausea and vomiting   Complete by: As directed    Call MD for:  severe uncontrolled pain   Complete by: As directed    Diet general   Complete by: As directed    Discharge instructions   Complete by: As directed    It has been a pleasure taking care of you!  You were hospitalized with worsening dizziness likely from dehydration in the setting of nausea, vomiting and not eating.  You might also have vertigo.  Your symptoms improved with IV fluid hydration and medication to support your blood pressure to the point we think it is safe to let you go home and follow-up with your primary care doctor and cardiologist.  We have started you  on new medication called midodrine to help with your blood pressure.  We recommend not taking meclizine.  In regards to your heart failure and Lasix, you may hold it on the days you are not eating or drinking well or you have nausea and vomiting.  You can also take an additional dose on the days you take you have leg swelling, shortness of breath or weight gain.  We recommend weighing yourself daily at the same time and keeping weight log.     Take care,   Increase activity slowly   Complete by: As directed       Allergies as of 09/11/2021       Reactions   Zetia [ezetimibe] Other (See Comments)   Myalgias    Codeine Nausea And Vomiting       Unithroid [levothyroxine Sodium] Other (See Comments)   Caused blurry vision per pt        Medication List     STOP taking these medications    meclizine 25 MG tablet Commonly known as: ANTIVERT       TAKE these medications    allopurinol 100 MG tablet Commonly known as: ZYLOPRIM Take 100 mg by mouth daily as needed (gout).   amiodarone 200 MG tablet Commonly known as: PACERONE Take 1 tablet (200 mg total) by mouth daily. What changed: medication strength   apixaban 2.5 MG Tabs tablet Commonly known as: ELIQUIS Take 1 tablet (2.5 mg total) by mouth 2 (two) times daily.   carvedilol 3.125 MG tablet Commonly known as: COREG Take 1 tablet (3.125 mg total) by mouth 2 (two) times daily with a meal.   empagliflozin 10 MG Tabs tablet Commonly known as: JARDIANCE Take 1 tablet (10 mg total) by mouth daily.   finasteride 5 MG tablet Commonly known as: PROSCAR Take 5 mg by mouth daily.   furosemide 20 MG tablet Commonly known as: LASIX Take 1 tablet (20 mg total) by mouth daily.   levothyroxine 25 MCG tablet Commonly known as: SYNTHROID Take 25 mcg by mouth daily.   multivitamin with minerals Tabs tablet Take 1 tablet by mouth daily.   nitroGLYCERIN 0.4 MG SL tablet Commonly known as: NITROSTAT Place 1 tablet (0.4  mg total) under the tongue every 5 (five) minutes as needed for chest pain.   ondansetron 4 MG tablet Commonly known as: ZOFRAN Take 4 mg by mouth every 8 (eight) hours as needed for nausea/vomiting.   Repatha SureClick 939 MG/ML Soaj Generic  drug: Evolocumab Inject 1 pen into the skin every 14 (fourteen) days.   tamsulosin 0.4 MG Caps capsule Commonly known as: FLOMAX Take 0.4 mg by mouth at bedtime.        Consultations: None  Procedures/Studies: None   DG Chest 2 View  Result Date: 09/10/2021 CLINICAL DATA:  Chest pain, shortness of breath EXAM: CHEST - 2 VIEW COMPARISON:  08/27/2021 FINDINGS: Left chest cardiac device and leads, unchanged. Status post median sternotomy. Heart at the upper limits normal in size. Low lung volumes. Left basilar opacity, which may represent atelectasis and/or scarring. No definite pleural effusion. Status post left shoulder arthroplasty. No acute osseous abnormality. IMPRESSION: Stable borderline cardiomegaly.  No acute cardiopulmonary process. Electronically Signed   By: Merilyn Baba M.D.   On: 09/10/2021 03:11   DG Chest 2 View  Result Date: 08/27/2021 CLINICAL DATA:  Chest pain EXAM: CHEST - 2 VIEW COMPARISON:  Chest radiograph 03/22/2021 FINDINGS: A left chest wall cardiac device and single associated lead are stable. Median sternotomy wires and mediastinal surgical clips are again noted. The heart is at the upper limits of normal for size, unchanged. Lung volumes are low. Patchy opacities in the lateral left base likely reflect atelectasis or scar. There is no new focal airspace disease. There is no significant pleural effusion. There is no pneumothorax. Left shoulder hardware is again noted. There is degenerative change of the right shoulder. There is no acute osseous abnormality. Upper abdominal surgical clips are again noted. IMPRESSION: Stable borderline cardiomegaly. No radiographic evidence of acute cardiopulmonary process. Electronically  Signed   By: Valetta Mole M.D.   On: 08/27/2021 11:47   CT HEAD WO CONTRAST (5MM)  Result Date: 09/11/2021 CLINICAL DATA:  Dizziness. EXAM: CT HEAD WITHOUT CONTRAST TECHNIQUE: Contiguous axial images were obtained from the base of the skull through the vertex without intravenous contrast. COMPARISON:  08/27/2021 FINDINGS: Brain: No evidence of acute infarction, hemorrhage, hydrocephalus, extra-axial collection or mass lesion/mass effect. Unchanged appearance of partially calcified dural base mass overlying the left cerebral hemisphere containing small calcification is identified. This measures 1.4 x 0.5 cm, image 26/4 and is favored to represent a meningioma. There is prominence of the sulci and ventricles compatible with brain atrophy. There is mild diffuse low-attenuation within the subcortical and periventricular white matter compatible with chronic microvascular disease. Vascular: No hyperdense vessel or unexpected calcification. Skull: Normal. Negative for fracture or focal lesion. Sinuses/Orbits: No acute finding. Other: None IMPRESSION: 1. No acute intracranial abnormalities. 2. Chronic small vessel ischemic disease and brain atrophy. 3. Stable appearance of partially calcified dural base mass overlying the left cerebral hemisphere compatible with a meningioma. Electronically Signed   By: Kerby Moors M.D.   On: 09/11/2021 10:27   CT HEAD WO CONTRAST (5MM)  Result Date: 08/27/2021 CLINICAL DATA:  Vertigo, peripheral. EXAM: CT HEAD WITHOUT CONTRAST TECHNIQUE: Contiguous axial images were obtained from the base of the skull through the vertex without intravenous contrast. COMPARISON:  None. FINDINGS: Brain: No evidence of acute infarction, hemorrhage, hydrocephalus, extra-axial collection or mass lesion/mass effect. There is mild diffuse atrophy. There is mild periventricular white matter hypodensity, likely chronic small vessel ischemic change. Vascular: There are atherosclerotic calcifications of the  intracranial internal carotid arteries. Skull: Normal. Negative for fracture or focal lesion. Sinuses/Orbits: No acute finding. Other: None. IMPRESSION: 1. No acute intracranial abnormality. 2. Mild diffuse atrophy.  Mild chronic small vessel ischemic change. Electronically Signed   By: Ronney Asters M.D.   On: 08/27/2021 17:16  CT Angio Chest PE W and/or Wo Contrast  Result Date: 08/27/2021 CLINICAL DATA:  PE suspected, low/intermediate probability, positive D-dimer EXAM: CT ANGIOGRAPHY CHEST WITH CONTRAST TECHNIQUE: Multidetector CT imaging of the chest was performed using the standard protocol during bolus administration of intravenous contrast. Multiplanar CT image reconstructions and MIPs were obtained to evaluate the vascular anatomy. CONTRAST:  70mL OMNIPAQUE IOHEXOL 350 MG/ML SOLN COMPARISON:  2019 FINDINGS: Cardiovascular: Satisfactory opacification of the pulmonary arteries to the segmental level. No evidence of pulmonary embolism. Normal heart size. Coronary artery calcifications. No pericardial effusion. Thoracic aorta is normal in caliber with calcified plaque. Mediastinum/Nodes: No enlarged lymph nodes. Thyroid and esophagus are unremarkable. Lungs/Pleura: Small to moderate pleural effusions bilaterally with adjacent compressive atelectasis. Patchy ground-glass density with small consolidative opacities. No pneumothorax. Upper Abdomen: No acute abnormality. Musculoskeletal: Degenerative changes of the included spine. Post sternotomy. Left shoulder arthroplasty with associated streak artifact. Left chest wall ICD. Review of the MIP images confirms the above findings. IMPRESSION: No evidence of acute pulmonary embolism. Small to moderate bilateral pleural effusions with adjacent atelectasis. Patchy ground-glass density with small consolidative opacities may reflect edema. Multifocal pneumonia is possible in the appropriate setting. Coronary artery calcification. Electronically Signed   By: Macy Mis M.D.   On: 08/27/2021 17:06   ECHOCARDIOGRAM COMPLETE  Result Date: 08/28/2021    ECHOCARDIOGRAM REPORT   Patient Name:   RENJI BERWICK Date of Exam: 08/28/2021 Medical Rec #:  [id number]         Height:       66.0 in Accession #:    [id number]        Weight:       154.3 lb Date of Birth:  [birth year]         BSA:          1.791 m Patient Age:    58 years          BP:           152/62 mmHg Patient Gender: M                 HR:           57 bpm. Exam Location:  Inpatient Procedure: 2D Echo, Cardiac Doppler, Color Doppler and Intracardiac            Opacification Agent Indications:    CHF-Acute Systolic Q11.94  History:        Patient has prior history of Echocardiogram examinations, most                 recent 12/28/2020. CHF, CAD and Previous Myocardial Infarction,                 Pacemaker, Carotid Disease and PAD, Arrythmias:Atrial                 Fibrillation, Signs/Symptoms:Shortness of Breath; Risk                 Factors:Hypertension and Dyslipidemia. GERD.  Sonographer:    Darlina Sicilian RDCS Referring Phys: [place]  1. Left ventricular ejection fraction, by estimation, is 50 to 55%. The left ventricle has low normal function. The left ventricle demonstrates regional wall motion abnormalities. Basal anterior/anteroseptal hypokinesis. There is severe asymmetric left ventricular hypertrophy of the basal-septal segment. Left ventricular diastolic parameters are indeterminate.  2. Right ventricular systolic function is moderately reduced. The right ventricular size is normal. There is normal pulmonary artery systolic pressure.  3.  The mitral valve is normal in structure. Trivial mitral valve regurgitation. No evidence of mitral stenosis.  4. The inferior vena cava is normal in size with greater than 50% respiratory variability, suggesting right atrial pressure of 3 mmHg.  5. The aortic valve is calcified. There is moderate calcification of the aortic valve. Aortic valve  regurgitation is not visualized. Moderate aortic valve stenosis. Mean gradient only 38mmHg, but AVA 1.0 cm^2 and DI 0.35, suspect paradoxical low flow low  gradient moderate AS given low SV index (21cc/m^2)  6. Small mobile echodensity adjacent to RV pacemaker lead (seen on image 14), could represent fibrinous strand or small adherent thrombus; if clinical concern for infection would check blood cultures FINDINGS  Left Ventricle: Left ventricular ejection fraction, by estimation, is 50 to 55%. The left ventricle has low normal function. The left ventricle demonstrates regional wall motion abnormalities. Definity contrast agent was given IV to delineate the left ventricular endocardial borders. The left ventricular internal cavity size was small. There is severe asymmetric left ventricular hypertrophy of the basal-septal segment. Left ventricular diastolic parameters are indeterminate. Right Ventricle: The right ventricular size is normal. Right vetricular wall thickness was not well visualized. Right ventricular systolic function is moderately reduced. There is normal pulmonary artery systolic pressure. The tricuspid regurgitant velocity is 2.14 m/s, and with an assumed right atrial pressure of 3 mmHg, the estimated right ventricular systolic pressure is 37.8 mmHg. Left Atrium: Left atrial size was normal in size. Right Atrium: Right atrial size was normal in size. Pericardium: Trivial pericardial effusion is present. Mitral Valve: The mitral valve is normal in structure. Trivial mitral valve regurgitation. No evidence of mitral valve stenosis. Tricuspid Valve: The tricuspid valve is normal in structure. Tricuspid valve regurgitation is trivial. Aortic Valve: The aortic valve is calcified. There is moderate calcification of the aortic valve. Aortic valve regurgitation is not visualized. Moderate aortic stenosis is present. Aortic valve mean gradient measures 6.5 mmHg. Aortic valve peak gradient measures 11.8 mmHg.  Aortic valve area, by VTI measures 0.87 cm. Pulmonic Valve: The pulmonic valve was not well visualized. Pulmonic valve regurgitation is not visualized. Aorta: The aortic root is normal in size and structure. Venous: The inferior vena cava is normal in size with greater than 50% respiratory variability, suggesting right atrial pressure of 3 mmHg. IAS/Shunts: The interatrial septum was not well visualized.  LEFT VENTRICLE PLAX 2D LVIDd:         4.15 cm LVIDs:         3.30 cm LV PW:         1.15 cm LV IVS:        1.15 cm LVOT diam:     1.90 cm LV SV:         34 LV SV Index:   19 LVOT Area:     2.84 cm  LV Volumes (MOD) LV vol d, MOD A2C: 80.1 ml LV vol d, MOD A4C: 74.1 ml LV vol s, MOD A2C: 38.3 ml LV vol s, MOD A4C: 29.9 ml LV SV MOD A2C:     41.8 ml LV SV MOD A4C:     74.1 ml LV SV MOD BP:      44.4 ml RIGHT VENTRICLE RV S prime:     5.43 cm/s LEFT ATRIUM             Index       RIGHT ATRIUM           Index LA diam:  4.00 cm 2.23 cm/m  RA Area:     15.30 cm LA Vol (A2C):   40.5 ml 22.61 ml/m RA Volume:   35.80 ml  19.99 ml/m LA Vol (A4C):   45.5 ml 25.40 ml/m LA Biplane Vol: 44.6 ml 24.90 ml/m  AORTIC VALVE AV Area (Vmax):    0.80 cm AV Area (Vmean):   0.79 cm AV Area (VTI):     0.87 cm AV Vmax:           171.75 cm/s AV Vmean:          120.250 cm/s AV VTI:            0.388 m AV Peak Grad:      11.8 mmHg AV Mean Grad:      6.5 mmHg LVOT Vmax:         48.75 cm/s LVOT Vmean:        33.300 cm/s LVOT VTI:          0.119 m LVOT/AV VTI ratio: 0.31  AORTA Ao Root diam: 3.20 cm TRICUSPID VALVE TR Peak grad:   18.3 mmHg TR Vmax:        214.00 cm/s  SHUNTS Systemic VTI:  0.12 m Systemic Diam: 1.90 cm Oswaldo Milian MD Electronically signed by Oswaldo Milian MD Signature Date/Time: 08/28/2021/5:32:57 PM    Final    CT Angio Chest/Abd/Pel for Dissection W and/or Wo Contrast  Result Date: 09/10/2021 CLINICAL DATA:  Concern for aortic dissection. EXAM: CT ANGIOGRAPHY CHEST, ABDOMEN AND PELVIS  TECHNIQUE: Non-contrast CT of the chest was initially obtained. Multidetector CT imaging through the chest, abdomen and pelvis was performed using the standard protocol during bolus administration of intravenous contrast. Multiplanar reconstructed images and MIPs were obtained and reviewed to evaluate the vascular anatomy. CONTRAST:  9mL OMNIPAQUE IOHEXOL 350 MG/ML SOLN COMPARISON:  Chest CT dated 08/27/2021. FINDINGS: CTA CHEST FINDINGS Cardiovascular: Borderline cardiomegaly. Three-vessel coronary vascular calcification and postsurgical changes of CABG. No pericardial effusion. Moderate atherosclerotic calcification of the thoracic aorta. No aneurysmal dilatation or dissection. No periaortic fluid collection. The origins of the great vessels of the aortic arch appear patent. The central pulmonary arteries are unremarkable. Mediastinum/Nodes: No hilar or mediastinal adenopathy. The esophagus is grossly unremarkable. No mediastinal fluid collection. Lungs/Pleura: Minimal atelectasis/scarring in the lingula. No focal consolidation, pleural effusion, or pneumothorax. The central airways are patent. Musculoskeletal: Osteopenia with degenerative changes of the spine. Left shoulder arthroplasty. Left pectoral pacemaker device. No acute osseous pathology. Median sternotomy wires. Review of the MIP images confirms the above findings. CTA ABDOMEN AND PELVIS FINDINGS VASCULAR Aorta: Moderate atherosclerotic calcification of the abdominal aorta. No aneurysmal dilatation or dissection. Occlusion of the distal aorta status post prior aorto bi femoral bypass graft. The graft is patent. Celiac: Atherosclerotic calcification of the origin of the celiac axis. The celiac artery and its branches are patent. SMA: Patent without evidence of aneurysm, dissection, vasculitis or significant stenosis. Renals: Atherosclerotic calcification of the origins of the renal arteries. The renal arteries are patent. IMA: Not visualized. Inflow:  Occlusion of the native common iliac arteries with reconstitution of the flow in the internal and external iliac arteries. The internal and external iliac arteries and the limbs of the bypass graft are patent. Veins: No obvious venous abnormality within the limitations of this arterial phase study. Review of the MIP images confirms the above findings. NON-VASCULAR No intra-abdominal free air or free fluid. Hepatobiliary: Subcentimeter hypodense lesion in the dome of the liver is too small to  characterize. No intrahepatic biliary ductal dilatation. Cholecystectomy. Pancreas: Unremarkable. No pancreatic ductal dilatation or surrounding inflammatory changes. Spleen: Normal in size without focal abnormality. Adrenals/Urinary Tract: The adrenal glands are unremarkable. Mild bilateral renal parenchyma atrophy. There is a 2.5 cm left renal inferior pole cyst. Mild bilateral renal parenchyma atrophy and areas of scarring. There is no hydronephrosis on either side. Visualized ureters appear unremarkable. The urinary bladder is poorly visualized due to streak artifact caused by bilateral hip arthroplasties. Stomach/Bowel: There is sigmoid diverticulosis without active inflammatory changes. There is no bowel obstruction or active inflammation. There is loose stool within the colon. Appendectomy. Lymphatic: No adenopathy. Reproductive: The prostate gland is poorly visualized due to streak artifact. Other: None Musculoskeletal: Osteopenia with degenerative changes of the spine. Lower lumbar laminectomy. Bilateral hip arthroplasties. No acute osseous pathology. Review of the MIP images confirms the above findings. IMPRESSION: 1. No acute intrathoracic, abdominal, or pelvic pathology. No aortic dissection or aneurysm. 2. 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 The graft is patent. 3. Aortic Atherosclerosis (ICD10-I70.0). Electronically Signed   By: Anner Crete M.D.   On:  09/10/2021 03:43       The results of significant diagnostics from this hospitalization (including imaging, microbiology, ancillary and laboratory) are listed below for reference.     Microbiology: Recent Results (from the past 240 hour(s))  Resp Panel by RT-PCR (Flu A&B, Covid) Nasopharyngeal Swab     Status: None   Collection Time: 09/10/21  2:37 AM   Specimen: Nasopharyngeal Swab; Nasopharyngeal(NP) swabs in vial transport medium  Result Value Ref Range Status   SARS Coronavirus 2 by RT PCR NEGATIVE NEGATIVE Final    Comment: (NOTE) SARS-CoV-2 target nucleic acids are NOT DETECTED.  The SARS-CoV-2 RNA is generally detectable in upper respiratory specimens during the acute phase of infection. The lowest concentration of SARS-CoV-2 viral copies this assay can detect is 138 copies/mL. A negative result does not preclude SARS-Cov-2 infection and should not be used as the sole basis for treatment or other patient management decisions. A negative result may occur with  improper specimen collection/handling, submission of specimen other than nasopharyngeal swab, presence of viral mutation(s) within the areas targeted by this assay, and inadequate number of viral copies(<138 copies/mL). A negative result must be combined with clinical observations, patient history, and epidemiological information. The expected result is Negative.  Fact Sheet for Patients:  EntrepreneurPulse.com.au  Fact Sheet for Healthcare Providers:  IncredibleEmployment.be  This test is no t yet approved or cleared by the [place] FDA and  has been authorized for detection and/or diagnosis of SARS-CoV-2 by FDA under an Emergency Use Authorization (EUA). This EUA will remain  in effect (meaning this test can be used) for the duration of the COVID-19 declaration under Section 564(b)(1) of the Act, 21 U.S.C.section 360bbb-3(b)(1), unless the authorization is terminated  or  revoked sooner.       Influenza A by PCR NEGATIVE NEGATIVE Final   Influenza B by PCR NEGATIVE NEGATIVE Final    Comment: (NOTE) The Xpert Xpress SARS-CoV-2/FLU/RSV plus assay is intended as an aid in the diagnosis of influenza from Nasopharyngeal swab specimens and should not be used as a sole basis for treatment. Nasal washings and aspirates are unacceptable for Xpert Xpress SARS-CoV-2/FLU/RSV testing.  Fact Sheet for Patients: EntrepreneurPulse.com.au  Fact Sheet for Healthcare Providers: IncredibleEmployment.be  This test is not yet approved or cleared by the [place] and has been authorized for  detection and/or diagnosis of SARS-CoV-2 by FDA under an Emergency Use Authorization (EUA). This EUA will remain in effect (meaning this test can be used) for the duration of the COVID-19 declaration under Section 564(b)(1) of the Act, 21 U.S.C. section 360bbb-3(b)(1), unless the authorization is terminated or revoked.  Performed at [facility], [place] [address], [place], [place] [address]      Labs:  CBC: Recent Labs  Lab 09/10/21 0205 09/11/21 0341  WBC 15.6* 7.3  NEUTROABS 14.1*  --   HGB 16.1 12.7*  HCT 49.2 38.6*  MCV 100.2* 99.5  PLT 297 194   BMP &GFR Recent Labs  Lab 09/10/21 0149 09/10/21 0241 09/11/21 0341  NA 139  --  138  K 4.2  --  4.4  CL 104  --  107  CO2 24  --  24  GLUCOSE 153*  --  93  BUN 19  --  19  CREATININE 1.76*  --  1.49*  CALCIUM 9.2  --  8.1*  MG  --  2.1 2.2  PHOS  --   --  3.4   Estimated Creatinine Clearance: 35.7 mL/min (A) (by C-G formula based on SCr of 1.49 mg/dL (H)). Liver & Pancreas: Recent Labs  Lab 09/10/21 0149 09/11/21 0341  AST 29  --   ALT 19  --   ALKPHOS 59  --   BILITOT 0.9  --   PROT 7.1  --   ALBUMIN 3.6 2.6*   Recent Labs  Lab 09/10/21 0149  LIPASE 26   No results for input(s): AMMONIA in the last 168 hours. Diabetic: No results for input(s):  HGBA1C in the last 72 hours. No results for input(s): GLUCAP in the last 168 hours. Cardiac Enzymes: No results for input(s): CKTOTAL, CKMB, CKMBINDEX, TROPONINI in the last 168 hours. No results for input(s): PROBNP in the last 8760 hours. Coagulation Profile: No results for input(s): INR, PROTIME in the last 168 hours. Thyroid Function Tests: Recent Labs    09/11/21 0341  TSH 5.069*   Lipid Profile: No results for input(s): CHOL, HDL, LDLCALC, TRIG, CHOLHDL, LDLDIRECT in the last 72 hours. Anemia Panel: Recent Labs    09/11/21 0341  VITAMINB12 266   Urine analysis:    Component Value Date/Time   COLORURINE YELLOW 09/10/2021 0449   APPEARANCEUR CLEAR 09/10/2021 0449   LABSPEC <1.005 (L) 09/10/2021 0449   PHURINE 5.0 09/10/2021 0449   GLUCOSEU >=500 (A) 09/10/2021 0449   HGBUR NEGATIVE 09/10/2021 [place] 09/10/2021 0449   KETONESUR NEGATIVE 09/10/2021 0449   PROTEINUR NEGATIVE 09/10/2021 0449   UROBILINOGEN 1.0 04/04/2015 2320   NITRITE NEGATIVE 09/10/2021 0449   LEUKOCYTESUR NEGATIVE 09/10/2021 0449   Sepsis Labs: Invalid input(s): PROCALCITONIN, LACTICIDVEN   Time coordinating discharge: 55 minutes  SIGNED:  Mercy Riding, MD  [facility] 09/11/2021, 11:11 AM

## 2021-09-11 NOTE — Progress Notes (Signed)
Pt is functioning independently in ADL andf ADL transfers. No OT needs.    09/11/21 1100  OT Visit Information  Last OT Received On 09/11/21  Assistance Needed +1  History of Present Illness Pt is an 80 y/o male admitted 9/21 secondary to nausea/vomiting and dizziness. PMH includes HTN, CHF, CKD, CAD s/p CABG, PE, PVD, and R TKA.  Precautions  Precautions Fall  Home Living  Family/patient expects to be discharged to: Private residence  Living Arrangements Spouse/significant other  Available Help at Discharge Family;Available 24 hours/day  Type of Home House  Home Access Stairs to enter  Entrance Stairs-Number of Steps 2  Entrance Stairs-Rails Right  Home Layout Two level;Able to live on main level with bedroom/bathroom  Bathroom Shower/Tub Tub/shower unit;Walk-in shower  Bathroom Toilet Handicapped [employer] - 2 wheels;Cane - single point;Walker - 4 wheels  Prior Function  Level of Independence Independent  Communication  Communication HOH  Pain Assessment  Pain Assessment No/denies pain  Cognition  Arousal/Alertness Awake/alert  Behavior During Therapy WFL for tasks assessed/performed  Overall Cognitive Status Within Functional Limits for tasks assessed  Upper Extremity Assessment  Upper Extremity Assessment Overall WFL for tasks assessed  Lower Extremity Assessment  Lower Extremity Assessment Defer to PT evaluation  Cervical / Trunk Assessment  Cervical / Trunk Assessment Kyphotic  ADL  Overall ADL's  Modified independent  General ADL Comments wife helps comb back of head  Vision- History  Patient Visual Report No change from baseline  Bed Mobility  Overal bed mobility Independent  Transfers  Overall transfer level Independent  Equipment used None  Balance  Overall balance assessment Independent  OT - End of Session  Activity Tolerance Patient tolerated treatment well  Patient left in chair;with call bell/phone within reach;with family/visitor  present  OT Assessment  OT Recommendation/Assessment Patient does not need any further OT services  OT Visit Diagnosis Dizziness and giddiness (R42)  AM-PAC OT "6 Clicks" Daily Activity Outcome Measure (Version 2)  Help from another person eating meals? 4  Help from another person taking care of personal grooming? 4  Help from another person toileting, which includes using toliet, bedpan, or urinal? 4  Help from another person bathing (including washing, rinsing, drying)? 4  Help from another person to put on and taking off regular upper body clothing? 4  Help from another person to put on and taking off regular lower body clothing? 4  6 Click Score 24  Progressive Mobility  What is the highest level of mobility based on the progressive mobility assessment? Level 5 (Walks with assist in room/hall) - Balance while stepping forward/back and can walk in room with assist - Complete  Mobility Ambulated independently in room  OT Recommendation  Follow Up Recommendations No OT follow up  OT Equipment None recommended by OT  Acute Rehab OT Goals  Patient Stated Goal to feel better  OT Time Calculation  OT Start Time (ACUTE ONLY) 1122  OT Stop Time (ACUTE ONLY) 1138  OT Time Calculation (min) 16 min  OT General Charges  $OT Visit 1 Visit  OT Evaluation  $OT Eval Low Complexity 1 Low  Written Expression  Dominant Hand Right  Nestor Lewandowsky, OTR/L Acute Rehabilitation Services Pager: [phone] Office: [phone]

## 2021-09-11 NOTE — Progress Notes (Addendum)
Patient shares with RN that he "is not able to peed if someone is in the room" except his wife. Patient is independent in room with RN but alone in the bathroom. He states he was able to "pee quite a bit in the commode. He also shares that he feels like the synthroid is making him sick. RN notified MD

## 2021-09-11 NOTE — Plan of Care (Signed)
  Problem: Education: Goal: Knowledge of General Education information will improve Description: Including pain rating scale, medication(s)/side effects and non-pharmacologic comfort measures Outcome: Adequate for Discharge   

## 2021-09-12 LAB — ACTH: C206 ACTH: 8.2 pg/mL (ref 7.2–63.3)

## 2021-09-15 ENCOUNTER — Other Ambulatory Visit: Payer: Self-pay | Admitting: Cardiovascular Disease

## 2021-09-17 ENCOUNTER — Encounter (HOSPITAL_COMMUNITY): Payer: Medicare Other

## 2021-09-17 ENCOUNTER — Telehealth: Payer: Self-pay

## 2021-09-17 ENCOUNTER — Ambulatory Visit: Payer: Medicare Other | Admitting: Vascular Surgery

## 2021-09-17 ENCOUNTER — Ambulatory Visit: Payer: Medicare Other

## 2021-09-17 NOTE — Telephone Encounter (Addendum)
Latitude alert for ATP therapy Event occurred 9/27 @ 12:48, rate 155, ATP delivered x1 with little change is rate or rhythm Since last remote there were 3 NSVT, and 7 NSVT following the above event which appear to be AF with RVR Eliquis prescribed.   Pt has history of the same occurring while doing yard work.    Pt meds include: Amiodarone 200mg  daily, carvedilol 3.125mg  BID, Eliquis for [place].    LOV with Dr. Lovena Le 05/20/21.    Attempted to reach pt to discuss 09/16/21 episode.  Primary # on record answered by spouse.  She stated she was not with patient at current time and to call home phone.  Attempted to call home number, no answer, LVM with device clinic # to return call.         Presenting Rate

## 2021-09-17 NOTE — Telephone Encounter (Signed)
The patient return nurse call. I let him speak with Benjamine Mola, rn.

## 2021-09-17 NOTE — Telephone Encounter (Signed)
Spoke with patient regarding ATP episode from 09/16/21 at 12:48pm patient stated that he was working on the generator, patient denied any CP/SOB patient stated that he was taking Amiodarone, Coreg as prescribed. Informed patient that Dr. Lovena Le would review the episode and if there any changes to medications or appointments someone would call him back, also informed patient of 6 month driving restrictions unless notified otherwise, patient voiced understanding.

## 2021-09-18 ENCOUNTER — Telehealth: Payer: Self-pay | Admitting: Cardiovascular Disease

## 2021-09-18 NOTE — Telephone Encounter (Signed)
**Note De-Identified Ryan [name] Obfuscation** The pt is having trouble getting his Repatha refill. Forwarding call to [employer] for assistance to the pt.

## 2021-09-18 NOTE — Telephone Encounter (Signed)
Pt is currently in the coverage gap with his insurance company and is required to pay about $140 dollars to refill until the beginning of the year.. pt would like to know if there are any patient assistance programs that could help with the price of the medication... please advise.

## 2021-09-18 NOTE — Telephone Encounter (Signed)
Called and pt agreeable to complete [employer] snf forms

## 2021-09-19 NOTE — Telephone Encounter (Signed)
The patient wife states someone called them about 15 minutes ago about refilling some medication and having it sent to their home. She would like a phone call back to discuss the medications.

## 2021-09-22 DIAGNOSIS — Z23 Encounter for immunization: Secondary | ICD-10-CM | POA: Diagnosis not present

## 2021-09-22 DIAGNOSIS — N1831 Chronic kidney disease, stage 3a: Secondary | ICD-10-CM | POA: Diagnosis not present

## 2021-09-22 DIAGNOSIS — I130 Hypertensive heart and chronic kidney disease with heart failure and stage 1 through stage 4 chronic kidney disease, or unspecified chronic kidney disease: Secondary | ICD-10-CM | POA: Diagnosis not present

## 2021-09-22 DIAGNOSIS — I5020 Unspecified systolic (congestive) heart failure: Secondary | ICD-10-CM | POA: Diagnosis not present

## 2021-09-22 DIAGNOSIS — R7989 Other specified abnormal findings of blood chemistry: Secondary | ICD-10-CM | POA: Diagnosis not present

## 2021-09-24 ENCOUNTER — Telehealth: Payer: Self-pay | Admitting: Cardiovascular Disease

## 2021-09-24 NOTE — Telephone Encounter (Signed)
Returned call and gave the patient the phone number for [employer] to call for patient assistance. I spoke with Carter Kitten with our refill department and she will contact patient tomorrow regarding samples and free trial. Patient verbalized understanding and agreement with plan.

## 2021-09-24 NOTE — Telephone Encounter (Signed)
Pt c/o medication issue:  1. Name of Medication:  empagliflozin (JARDIANCE) 10 MG TABS tablet  2. How are you currently taking this medication (dosage and times per day)?  As prescribed   3. Are you having a reaction (difficulty breathing--STAT)?  No   4. What is your medication issue?   Patient's wife states the patient only has 1 tablet remaining and this medication will cost him $320/refill. She would like to discuss assistance options.

## 2021-09-25 ENCOUNTER — Other Ambulatory Visit: Payer: Self-pay

## 2021-09-25 MED ORDER — EMPAGLIFLOZIN 10 MG PO TABS: 10.0000 mg | ORAL_TABLET | Freq: Every day | ORAL | 0 refills | Status: DC

## 2021-09-25 NOTE — Telephone Encounter (Signed)
Called pt to inform him that I would be leaving him a 14 day free trial for Jardiance 10 mg tablets and the pt assistant information again, so he would have it on hand and a month of samples, 4 boxes of jardiance 10 mg tablets as well, at the front desk for pt to pick up. I advised the pt that if he has any other problems, questions or concerns, to give our office a call back. Pt verbalized understanding.  LOT: [id number]  EXP: 04/2023  FYI

## 2021-09-25 NOTE — Telephone Encounter (Signed)
Pt's medication was resent to another pharmacy as requested. Confirmation received.

## 2021-09-30 ENCOUNTER — Ambulatory Visit: Payer: Medicare Other | Admitting: Physical Therapy

## 2021-09-30 ENCOUNTER — Other Ambulatory Visit (HOSPITAL_COMMUNITY): Payer: Self-pay | Admitting: *Deleted

## 2021-09-30 ENCOUNTER — Other Ambulatory Visit (HOSPITAL_COMMUNITY): Payer: Self-pay | Admitting: Internal Medicine

## 2021-09-30 DIAGNOSIS — R110 Nausea: Secondary | ICD-10-CM | POA: Diagnosis not present

## 2021-09-30 DIAGNOSIS — H9313 Tinnitus, bilateral: Secondary | ICD-10-CM | POA: Diagnosis not present

## 2021-09-30 DIAGNOSIS — R42 Dizziness and giddiness: Secondary | ICD-10-CM | POA: Diagnosis not present

## 2021-09-30 DIAGNOSIS — I130 Hypertensive heart and chronic kidney disease with heart failure and stage 1 through stage 4 chronic kidney disease, or unspecified chronic kidney disease: Secondary | ICD-10-CM | POA: Diagnosis not present

## 2021-09-30 DIAGNOSIS — M25511 Pain in right shoulder: Secondary | ICD-10-CM | POA: Diagnosis not present

## 2021-09-30 DIAGNOSIS — I480 Paroxysmal atrial fibrillation: Secondary | ICD-10-CM | POA: Diagnosis not present

## 2021-09-30 DIAGNOSIS — R7989 Other specified abnormal findings of blood chemistry: Secondary | ICD-10-CM | POA: Diagnosis not present

## 2021-09-30 DIAGNOSIS — D6869 Other thrombophilia: Secondary | ICD-10-CM | POA: Diagnosis not present

## 2021-09-30 DIAGNOSIS — Z1152 Encounter for screening for COVID-19: Secondary | ICD-10-CM | POA: Diagnosis not present

## 2021-09-30 DIAGNOSIS — R0789 Other chest pain: Secondary | ICD-10-CM | POA: Diagnosis not present

## 2021-09-30 DIAGNOSIS — N1831 Chronic kidney disease, stage 3a: Secondary | ICD-10-CM | POA: Diagnosis not present

## 2021-09-30 DIAGNOSIS — I5020 Unspecified systolic (congestive) heart failure: Secondary | ICD-10-CM | POA: Diagnosis not present

## 2021-10-01 ENCOUNTER — Other Ambulatory Visit: Payer: Self-pay

## 2021-10-01 ENCOUNTER — Ambulatory Visit (HOSPITAL_BASED_OUTPATIENT_CLINIC_OR_DEPARTMENT_OTHER)
Admission: RE | Admit: 2021-10-01 | Discharge: 2021-10-01 | Disposition: A | Discharge status: Home / Self Care | Payer: Medicare Other | Source: Ambulatory Visit | Attending: Internal Medicine | Admitting: Internal Medicine

## 2021-10-01 ENCOUNTER — Ambulatory Visit (INDEPENDENT_AMBULATORY_CARE_PROVIDER_SITE_OTHER): Payer: Medicare Other

## 2021-10-01 DIAGNOSIS — H9313 Tinnitus, bilateral: Secondary | ICD-10-CM | POA: Insufficient documentation

## 2021-10-01 DIAGNOSIS — I472 Ventricular tachycardia, unspecified: Secondary | ICD-10-CM

## 2021-10-01 DIAGNOSIS — I6521 Occlusion and stenosis of right carotid artery: Secondary | ICD-10-CM | POA: Diagnosis not present

## 2021-10-01 DIAGNOSIS — R42 Dizziness and giddiness: Secondary | ICD-10-CM | POA: Diagnosis not present

## 2021-10-01 LAB — ACTH STIMULATION, 3 TIME POINTS
Cortisol, 30 Min: 30.4 ug/dL
Cortisol, 60 Min: 38.5 ug/dL
Cortisol, Base: 17.7 ug/dL

## 2021-10-01 MED ORDER — COSYNTROPIN 0.25 MG IJ SOLR: 0.2500 mg | Freq: Once | INTRAMUSCULAR | Status: AC

## 2021-10-01 MED ORDER — IOHEXOL 350 MG/ML SOLN
75.0000 mL | Freq: Once | INTRAVENOUS | Status: AC | PRN
  Administered 2021-10-01: 75 mL via INTRAVENOUS

## 2021-10-01 MED ORDER — COSYNTROPIN 0.25 MG IJ SOLR
INTRAMUSCULAR | Status: AC
  Administered 2021-10-01: 0.25 mg via INTRAVENOUS
  Filled 2021-10-01: qty 0.25

## 2021-10-02 LAB — CUP PACEART REMOTE DEVICE CHECK
Battery Remaining Longevity: 180 mo
Battery Remaining Percentage: 100 %
Brady Statistic RV Percent Paced: 4 %
Date Time Interrogation Session: 20221012040100
HighPow Impedance: 64 Ohm
Implantable Lead Implant Date: 20220110
Implantable Lead Location: 753860
Implantable Lead Model: 137
Implantable Lead Serial Number: 301176
Implantable Pulse Generator Implant Date: 20220110
Lead Channel Impedance Value: 536 Ohm
Lead Channel Setting Pacing Amplitude: 2.5 V
Lead Channel Setting Pacing Pulse Width: 0.4 ms
Lead Channel Setting Sensing Sensitivity: 0.5 mV
Pulse Gen Serial Number: 211464

## 2021-10-09 NOTE — Progress Notes (Signed)
Remote ICD transmission.   

## 2021-10-15 DIAGNOSIS — H906 Mixed conductive and sensorineural hearing loss, bilateral: Secondary | ICD-10-CM | POA: Diagnosis not present

## 2021-10-15 DIAGNOSIS — R42 Dizziness and giddiness: Secondary | ICD-10-CM | POA: Diagnosis not present

## 2021-10-15 DIAGNOSIS — H9313 Tinnitus, bilateral: Secondary | ICD-10-CM | POA: Diagnosis not present

## 2021-10-16 ENCOUNTER — Encounter (HOSPITAL_BASED_OUTPATIENT_CLINIC_OR_DEPARTMENT_OTHER): Payer: Self-pay | Admitting: Family

## 2021-10-16 ENCOUNTER — Ambulatory Visit (INDEPENDENT_AMBULATORY_CARE_PROVIDER_SITE_OTHER): Payer: Medicare Other | Admitting: Family

## 2021-10-16 ENCOUNTER — Other Ambulatory Visit: Payer: Self-pay

## 2021-10-16 VITALS — BP 170/99 | HR 57 | Ht 66.0 in | Wt 149.0 lb

## 2021-10-16 DIAGNOSIS — I472 Ventricular tachycardia, unspecified: Secondary | ICD-10-CM | POA: Diagnosis not present

## 2021-10-16 DIAGNOSIS — Z79899 Other long term (current) drug therapy: Secondary | ICD-10-CM

## 2021-10-16 DIAGNOSIS — Z95810 Presence of automatic (implantable) cardiac defibrillator: Secondary | ICD-10-CM | POA: Diagnosis not present

## 2021-10-16 DIAGNOSIS — I25118 Atherosclerotic heart disease of native coronary artery with other forms of angina pectoris: Secondary | ICD-10-CM

## 2021-10-16 DIAGNOSIS — I480 Paroxysmal atrial fibrillation: Secondary | ICD-10-CM | POA: Diagnosis not present

## 2021-10-16 DIAGNOSIS — I5042 Chronic combined systolic (congestive) and diastolic (congestive) heart failure: Secondary | ICD-10-CM

## 2021-10-16 DIAGNOSIS — N1831 Chronic kidney disease, stage 3a: Secondary | ICD-10-CM

## 2021-10-16 MED ORDER — SPIRONOLACTONE 25 MG PO TABS: 12.5000 mg | ORAL_TABLET | Freq: Every day | ORAL | 3 refills | Status: DC

## 2021-10-16 MED ORDER — CARVEDILOL 3.125 MG PO TABS: 3.1250 mg | ORAL_TABLET | Freq: Two times a day (BID) | ORAL | 1 refills | Status: DC

## 2021-10-16 MED ORDER — APIXABAN 2.5 MG PO TABS: 2.5000 mg | ORAL_TABLET | Freq: Two times a day (BID) | ORAL | 5 refills | Status: DC

## 2021-10-16 MED ORDER — FUROSEMIDE 20 MG PO TABS: 20.0000 mg | ORAL_TABLET | Freq: Every day | ORAL | 1 refills | Status: DC

## 2021-10-16 NOTE — Progress Notes (Signed)
Office Visit    Patient Name: Ryan [name] Date of Encounter: 10/16/2021  PCP:  Ginger Organ., MD   [facility]  Cardiologist:  Mertie Moores, MD  Advanced Practice Provider:  No care team member to display Electrophysiologist:  None      Chief Complaint    Ryan [name] is a 80 y.o. male with a hx of CAD s/p CABG 2016 with cardiac cath 01/2019 showing patent LIMA-LAD and competitive LIMA flow and occluded SVG-RCA/OM1 graft, chronic systolic heart failure with LVEF previously 35-40% now improved to 50 to 55%, history of VT s/p ICD 12/2020, paroxysmal atrial fibrillation, DVT/PE on Eliquis, PVD with carotid stenosis, CVA, CKD 3 A presents today for hospital follow-up  Past Medical History    Past Medical History:  Diagnosis Date   Arthritis    CAD (coronary artery disease)    Carotid artery occlusion    Cataract    Bil eyes/worse in left eye   CHF (congestive heart failure) (HCC)    Chronic back pain    DVT (deep venous thrombosis) ([place])    Dysrhythmia    Enlarged prostate    takes Rapaflo daily   GERD (gastroesophageal reflux disease)    occasional   History of colon polyps    History of gout    has colchicine prn   History of kidney stones    Hyperlipidemia    takes Crestor daily   Hypertension    takes Amlodipine daily   Hypothyroidism    Myocardial infarction [facility])    Peripheral vascular disease ([place])    Pulmonary emboli ([place]) 03/20/2015   elevated d-dimer, intermediate V/Q study, atypical chest pain and SOB. Start on Xarelto 20mg  BID for 3 month   Rapid atrial fibrillation ([place])    Renal insufficiency    Shortness of breath dyspnea    Urinary frequency    Urinary urgency    Past Surgical History:  Procedure Laterality Date   APPENDECTOMY     BACK SURGERY     5 times   big toe surgery     CARDIAC CATHETERIZATION     2010    dr Acie Fredrickson   cataract surgery     left eye   CHOLECYSTECTOMY N/A 07/27/2016   Procedure:  LAPAROSCOPIC CHOLECYSTECTOMY;  Surgeon: Mickeal Skinner, MD;  Location: [place];  Service: General;  Laterality: N/A;   COLONOSCOPY     CORONARY ARTERY BYPASS GRAFT N/A 04/05/2015   Procedure: CORONARY ARTERY BYPASS GRAFTING (CABG)X4 LIMA-LAD; SVG-DIAG1-DIAG2; SVG-PD;  Surgeon: [name], MD;  Location: [place];  Service: Open Heart Surgery;  Laterality: N/A;   CORONARY/GRAFT ANGIOGRAPHY N/A 04/22/2018   Procedure: CORONARY/GRAFT ANGIOGRAPHY;  Surgeon: Martinique, Peter M, MD;  Location: [place] CV LAB;  Service: Cardiovascular;  Laterality: N/A;   CYSTOSCOPY     ENDARTERECTOMY Left 04/24/2016   Procedure: ENDARTERECTOMY LEFT CAROTID;  Surgeon: Mal Misty, MD;  Location: [place];  Service: Vascular;  Laterality: Left;   EYE SURGERY     FEMORAL ARTERY - POPLITEAL ARTERY BYPASS GRAFT     ICD IMPLANT N/A 12/30/2020   Procedure: ICD IMPLANT;  Surgeon: Evans Lance, MD;  Location: [place] CV LAB;  Service: Cardiovascular;  Laterality: N/A;   JOINT REPLACEMENT     shoulder   LEFT HEART CATHETERIZATION WITH CORONARY ANGIOGRAM N/A 04/03/2015   Procedure: LEFT HEART CATHETERIZATION WITH CORONARY ANGIOGRAM;  Surgeon: Troy Sine, MD;  Location: [facility]  CATH LAB;  Service: Cardiovascular;  Laterality: N/A;   LUMBAR LAMINECTOMY  01/06/2013   Procedure: MICRODISCECTOMY LUMBAR LAMINECTOMY;  Surgeon: Marybelle Killings, MD;  Location: [place];  Service: Orthopedics;  Laterality: N/A;  L3-4 decompression   LUMBAR LAMINECTOMY/DECOMPRESSION MICRODISCECTOMY  02/12/2012   Procedure: LUMBAR LAMINECTOMY/DECOMPRESSION MICRODISCECTOMY;  Surgeon: Floyce Stakes, MD;  Location: [place] [facility];  Service: Neurosurgery;  Laterality: N/A;  Lumbar four-five laminectomy   PATCH ANGIOPLASTY Left 04/24/2016   Procedure: LEFT CAROTID ARTERY PATCH ANGIOPLASTY;  Surgeon: Mal Misty, MD;  Location: [place];  Service: Vascular;  Laterality: Left;   RIGHT HEART CATH AND CORONARY/GRAFT ANGIOGRAPHY N/A 01/30/2019   Procedure: RIGHT  HEART CATH AND CORONARY/GRAFT ANGIOGRAPHY;  Surgeon: Troy Sine, MD;  Location: [place] CV LAB;  Service: Cardiovascular;  Laterality: N/A;   STERIOD INJECTION Right 01/09/2014   Procedure: STEROID INJECTION;  Surgeon: Mcarthur Rossetti, MD;  Location: [place];  Service: Orthopedics;  Laterality: Right;   TEE WITHOUT CARDIOVERSION N/A 04/05/2015   Procedure: TRANSESOPHAGEAL ECHOCARDIOGRAM (TEE);  Surgeon: [name], MD;  Location: [place];  Service: Open Heart Surgery;  Laterality: N/A;   TOTAL HIP ARTHROPLASTY Left 01/09/2014   DR Ninfa Linden   TOTAL HIP ARTHROPLASTY Left 01/09/2014   Procedure: LEFT TOTAL HIP ARTHROPLASTY ANTERIOR APPROACH and Steroid Injection Right hip;  Surgeon: Mcarthur Rossetti, MD;  Location: [place];  Service: Orthopedics;  Laterality: Left;   TOTAL HIP ARTHROPLASTY Right 08/15/2019   TOTAL HIP ARTHROPLASTY Right 08/15/2019   Procedure: RIGHT TOTAL HIP ARTHROPLASTY ANTERIOR APPROACH;  Surgeon: Mcarthur Rossetti, MD;  Location: [place];  Service: Orthopedics;  Laterality: Right;    Allergies  Allergies  Allergen Reactions   Zetia [Ezetimibe] Other (See Comments)    Myalgias    Codeine Nausea And Vomiting        Unithroid [Levothyroxine Sodium] Other (See Comments)    Caused blurry vision per pt    History of Present Illness    Ryan [name] is a 80 y.o. male with a hx of CAD s/p CABG 2016 with cardiac cath 01/2019 showing patent LIMA-LAD and competitive LIMA flow and occluded SVG-RCA/OM1 graft, chronic systolic heart failure with LVEF previously 35-40% now improved to 50 to 55%, history of VT s/p ICD 12/2020, paroxysmal atrial fibrillation, DVT/PE on Eliquis, PVD with carotid stenosis, CVA, CKD 3 A last seen in Heart & Vascular TOC clinic 09/02/21.  Cardiac catheterization 2010 with total occlusion of RCA, left circumflex following the first OM and high-grade ostial stenosis of diagonal branch of LAD.  He developed severe chest pain early  2016 with cardiac catheterization with severe multivessel disease.  Underwent CABG x4 (LIMA-LAD, sequential SVG to diagonal 1A and B, SVG to posterior descending) is.  He developed atrial fibrillation was started on amiodarone and beta-blocker.  EF at the time was low 35 to 40%.  February 2020 admitted for shortness of breath, chest pain, repeat LHC with patent LIMA to LAD with competitive LIMA flow and occluded SVG to RCA/OM1 graft.  No targets for PCI, amlodipine added.  EF improved to 50 to 55%.  Admitted 08/27/2021 with acute on chronic CHF.  He was diuresed via IV.  Echo 08/28/2021 LVEF 50 to 55%, moderately reduced RV function, moderate aortic stenosis please.  As of below we will switch to low-dose carvedilol and spironolactone added.  Losartan was held due to low blood pressure and dizziness.  TOC follow-up 09/02/2021 his spironolactone was stopped due to  dizziness.  Admitted 9/21 - 09/11/2021 with nausea and vomiting with AKI on CKD 3A.  Mildly elevated troponin which remained flat in the 40s and was deemed likely due to poor renal clearance.  Presents today for follow-up with his wife. No recurrent vomiting. Does feel nausea after taking his medicaitons. Takes medications in the morning, but does not eat with his medications. Blood pressure at home has been 130s-170s. Weight stable at 145-149lbs. No chest pain, pressure, tightness. Exertional dyspnea improving. Trace ankle edema which improves with elevation. Exercising by walking twice per day.   EKGs/Labs/Other Studies Reviewed:   The following studies were reviewed today:  Echo  08/28/2021  1. Left ventricular ejection fraction, by estimation, is 50 to 55%. The  left ventricle has low normal function. The left ventricle demonstrates  regional wall motion abnormalities. Basal anterior/anteroseptal  hypokinesis. There is severe asymmetric left  ventricular hypertrophy of the basal-septal segment. Left ventricular  diastolic parameters are  indeterminate.   2. Right ventricular systolic function is moderately reduced. The right  ventricular size is normal. There is normal pulmonary artery systolic  pressure.   3. The mitral valve is normal in structure. Trivial mitral valve  regurgitation. No evidence of mitral stenosis.   4. The inferior vena cava is normal in size with greater than 50%  respiratory variability, suggesting right atrial pressure of 3 mmHg.   5. The aortic valve is calcified. There is moderate calcification of the  aortic valve. Aortic valve regurgitation is not visualized. Moderate  aortic valve stenosis. Mean gradient only 64mmHg, but AVA 1.0 cm^2 and DI  0.35, suspect paradoxical low flow low   gradient moderate AS given low SV index (21cc/m^2)   6. Small mobile echodensity adjacent to RV pacemaker lead (seen on image  14), could represent fibrinous strand or small adherent thrombus; if  clinical concern for infection would check blood cultures   Echo 12/28/2020  1. Left ventricular ejection fraction, by estimation, is 35 to 40%. The  left ventricle has moderately decreased function. The left ventricle has  no regional wall motion abnormalities. There is moderate asymmetric left  ventricular hypertrophy. Left  ventricular diastolic function could not be evaluated.   2. Right ventricular systolic function is normal. The right ventricular  size is normal. There is mildly elevated pulmonary artery systolic  pressure.   3. Left atrial size was moderately dilated.   4. The mitral valve is grossly normal. No evidence of mitral valve  regurgitation.   5. The aortic valve is tricuspid. There is mild calcification of the  aortic valve. There is moderate thickening of the aortic valve. Aortic  valve regurgitation is not visualized.   Right/LHC 01/30/2019 Ost 1st Diag lesion is 80% stenosed. Prox RCA to Mid RCA lesion is 100% stenosed. Ost Ramus to Ramus lesion is 90% stenosed. LIMA and is normal in  caliber. The graft exhibits no disease. SVG. Origin to Prox Graft lesion is 100% stenosed. SVG. Origin to Prox Graft lesion is 100% stenosed. 1st Diag lesion is 95% stenosed. Ost LAD to Prox LAD lesion is 50% stenosed. Ost Cx to Prox Cx lesion is 65% stenosed. Mid Cx to Dist Cx lesion is 85% stenosed. Prox LAD lesion is 70% stenosed. Prox LAD to Mid LAD lesion is 50% stenosed.   Severe native coronary artery disease with diffuse proximal to mid LAD stenosis with diffusely diseased native LAD proximally and mid with competitive filling from the LIMA graft; 90% proximal stenosis in the ramus immediate  vessel, diffuse stenosis of 60 to 70% in the proximal circumflex with diffuse 80% distal stenosis and total occlusion of the AV groove after marginal vessel with total occlusion of the proximal native RCA.  There is extensive collateralization to the distal RCA both via the circumflex vessel as well as via the LIMA to LAD and distal LAD.   Patent LIMA graft supplying the mid LAD   Previously documented old occlusion of the previous Y graft which had supplied the ramus and marginal vessel in the vein graft which had supplied the PDA.   Low right heart pressures with mean PA pressure at 19 mmHg.   RECOMMENDATION: Increase medical therapy.  The patient has only been on low-dose anti-ischemic medical regimen consisting of bisoprolol 2.5 mg as well as isosorbide 60 mg.  Recommend initiation of amlodipine 5 mg.  Also recommend consideration of Ranexa 500 mg twice a day.  The patient is on rosuvastatin.  However if the patient is to be on atorvastatin, the maximum recommended dose of atorvastatin is 40 mg if the patient is on ranolazine.  EKG:  No EKG today  Recent Labs: 09/10/2021: ALT 19 09/11/2021: B Natriuretic Peptide 853.2; BUN 19; Creatinine, Ser 1.49; Hemoglobin 12.7; Magnesium 2.2; Platelets 194; Potassium 4.4; Sodium 138; TSH 5.069  Recent Lipid Panel    Component Value Date/Time   CHOL  99 (L) 07/29/2020 1213   TRIG 72 07/29/2020 1213   HDL 39 (L) 07/29/2020 1213   CHOLHDL 2.5 07/29/2020 1213   CHOLHDL 3.8 02/08/2018 1003   VLDL 22 02/08/2018 1003   LDLCALC 45 07/29/2020 1213    Risk Assessment/Calculations:   CHA2DS2-VASc Score = 5   This indicates a 7.2% annual risk of stroke. The patient's score is based upon: CHF History: 1 HTN History: 1 Diabetes History: 0 Stroke History: 0 Vascular Disease History: 1 Age Score: 2 Gender Score: 0    Home Medications   Current Meds  Medication Sig   allopurinol (ZYLOPRIM) 100 MG tablet Take 100 mg by mouth daily as needed (gout).   ALPRAZolam (XANAX) 0.5 MG tablet Take 0.5 mg by mouth 3 (three) times daily as needed.   amiodarone (PACERONE) 200 MG tablet Take 1 tablet (200 mg total) by mouth daily.   empagliflozin (JARDIANCE) 10 MG TABS tablet Take 1 tablet (10 mg total) by mouth daily.   finasteride (PROSCAR) 5 MG tablet Take 5 mg by mouth daily.   levothyroxine (SYNTHROID) 25 MCG tablet Take 25 mcg by mouth daily.   nitroGLYCERIN (NITROSTAT) 0.4 MG SL tablet Place 1 tablet (0.4 mg total) under the tongue every 5 (five) minutes as needed for chest pain.   ondansetron (ZOFRAN) 4 MG tablet Take 4 mg by mouth every 8 (eight) hours as needed for nausea/vomiting.   pantoprazole (PROTONIX) 40 MG tablet Take 40 mg by mouth daily.   REPATHA SURECLICK 923 MG/ML SOAJ INJECT 1 PEN INTO THE SKIN EVERY 14 DAYS.   spironolactone (ALDACTONE) 25 MG tablet Take 0.5 tablets (12.5 mg total) by mouth daily.   tamsulosin (FLOMAX) 0.4 MG CAPS capsule Take 0.4 mg by mouth at bedtime.   [DISCONTINUED] apixaban (ELIQUIS) 2.5 MG TABS tablet Take 1 tablet (2.5 mg total) by mouth 2 (two) times daily.   [DISCONTINUED] carvedilol (COREG) 3.125 MG tablet Take 1 tablet (3.125 mg total) by mouth 2 (two) times daily with a meal.   [DISCONTINUED] furosemide (LASIX) 20 MG tablet Take 1 tablet (20 mg total) by mouth daily.  Review of Systems       All other systems reviewed and are otherwise negative except as noted above.  Physical Exam    VS:  BP (!) 170/99 Comment: done by  [employer]  Pulse (!) 57   Ht 5\' 6"  (1.676 m)   Wt 149 lb (67.6 kg)   SpO2 97%   BMI 24.05 kg/m  , BMI Body mass index is 24.05 kg/m.  Wt Readings from Last 3 Encounters:  10/16/21 149 lb (67.6 kg)  10/01/21 145 lb (65.8 kg)  09/11/21 147 lb 14.4 oz (67.1 kg)    GEN: Well nourished, well developed, in no acute distress. HEENT: normal. Neck: Supple, no JVD or masses. Cardiac: RRR, no murmurs, rubs, or gallops. No clubbing, cyanosis. Trace ankle edema bilaterally.  Radials/PT 2+ and equal bilaterally.  Respiratory:  Respirations regular and unlabored, clear to auscultation bilaterally. GI: Soft, nontender, nondistended. MS: No deformity or atrophy. Skin: Warm and dry, no rash. Neuro:  Strength and sensation are intact. Psych: Normal affect.  Assessment & Plan     CAD s/p CABG - Stable with no anginal symptoms. No indication for ischemic evaluation.  GDMT includes carvedilol, repatha, jardiance. No aspirin due to chronic anticoagulation. Heart healthy diet and regular cardiovascular exercise encouraged.    Nausea / Dizziness - After morning medications. He is taking all of his morning medications together on an empty stomach. Encouraged to eat with medications. He will take Eliquis, Coreg, Lasix, Spironolactone first thing in the morning. He will take Jardiance, Protonix, and Amiodarone at lunch time. Hopeful this will improve symptoms.   CKD 3 A - Careful titration of diuretic and antihypertensive.  Upcoming labs with primary care.  HFrEF with recovered LVEF - Euvolemic and well compensated on exam. GDMT includes Coreg, Lasix, Jardiance. Given elevated BP and trace bilateral ankle edema will resume Spironolactone 12.5mg  QD. Low salt diet, fluid restriction, daily weight encouraged.   PAF and VT s/p AICD /on amiodarone therapy /anticoagulation -  Follows with EP Dr. Lovena Le. Recent ATP episode 09/16/21 with 6 months driving restrictions. No signs of amiodarone toxicity. 08/2021 normal liver enzymes. 09/11/21 TSH 5.069. Levothyroxine dosing per primary care.   Hypertension / Hx of hypotension - Previous hypotension on midodrine. No longer on midodrine. Now hypertensive home BP readings 130s-170s. Discussed BP goal <130/80. Has not yet taken all of his morning medications. Continue Coreg 3.125mg  BID, Lasix 20mg  QD. Resume Spironolactone 12.5mg  QD. Future considerations include resumption of Losartan.   Disposition: Follow up in 2 month(s) with Dr. Acie Fredrickson or APP.  Signed, Loel Dubonnet, NP 10/16/2021, 10:11 AM [place]

## 2021-10-16 NOTE — Patient Instructions (Addendum)
Medication Instructions:  Your physician has recommended you make the following change in your medication:    STOP Bisoprolol  RESUME Spironolactone half tablet (12.11m) daily   CHANGE the timing of your medications Take your Eliquis, Carvedilol, Lasix, Spironolactone first thing in the morning with food A few hours later or at lunch time take your Amiodarone, Jardiance, and Protonix  *If you need a refill on your cardiac medications before your next appointment, please call your pharmacy*  Lab Work: None ordered today.  Testing/Procedures: None ordered today  Follow-Up: At [facility], you and your health needs are our priority.  As part of our continuing mission to provide you with exceptional heart care, we have created designated Provider Care Teams.  These Care Teams include your primary Cardiologist (physician) and Advanced Practice Providers (APPs -  Physician Assistants and Nurse Practitioners) who all work together to provide you with the care you need, when you need it.  We recommend signing up for the patient portal called "MyChart".  Sign up information is provided on this After Visit Summary.  MyChart is used to connect with patients for Virtual Visits (Telemedicine).  Patients are able to view lab/test results, encounter notes, upcoming appointments, etc.  Non-urgent messages can be sent to your provider as well.   To learn more about what you can do with MyChart, go to [email or web address].    Your next appointment:   2-3 month(s)  The format for your next appointment:   In Person  Provider:   You may see Mertie Moores, MD or one of the following Advanced Practice Providers on your designated Care Team:   [name], PA-C Vin Parole, [place] Loel Dubonnet, NP   Other Instructions  Heart Healthy Diet Recommendations: A low-salt diet is recommended. Meats should be grilled, baked, or boiled. Avoid fried foods. Focus on lean protein sources like fish or  chicken with vegetables and fruits. The American Heart Association is a Microbiologist!  American Heart Association Diet and Lifeystyle Recommendations    Exercise recommendations: The American Heart Association recommends 150 minutes of moderate intensity exercise weekly. Try 30 minutes of moderate intensity exercise 4-5 times per week. This could include walking, jogging, or swimming.    Recommend weighing daily and keeping a log. Please call our office if you have weight gain of 2 pounds overnight or 5 pounds in 1 week.   Date  Time Weight

## 2021-10-20 DIAGNOSIS — I5022 Chronic systolic (congestive) heart failure: Secondary | ICD-10-CM | POA: Diagnosis not present

## 2021-10-20 DIAGNOSIS — E785 Hyperlipidemia, unspecified: Secondary | ICD-10-CM | POA: Diagnosis not present

## 2021-10-20 DIAGNOSIS — I130 Hypertensive heart and chronic kidney disease with heart failure and stage 1 through stage 4 chronic kidney disease, or unspecified chronic kidney disease: Secondary | ICD-10-CM | POA: Diagnosis not present

## 2021-10-20 DIAGNOSIS — N1831 Chronic kidney disease, stage 3a: Secondary | ICD-10-CM | POA: Diagnosis not present

## 2021-10-23 ENCOUNTER — Other Ambulatory Visit: Payer: Self-pay

## 2021-10-23 DIAGNOSIS — I739 Peripheral vascular disease, unspecified: Secondary | ICD-10-CM

## 2021-10-27 ENCOUNTER — Emergency Department (HOSPITAL_BASED_OUTPATIENT_CLINIC_OR_DEPARTMENT_OTHER): Payer: Medicare Other

## 2021-10-27 ENCOUNTER — Other Ambulatory Visit: Payer: Self-pay

## 2021-10-27 ENCOUNTER — Encounter (HOSPITAL_BASED_OUTPATIENT_CLINIC_OR_DEPARTMENT_OTHER): Payer: Self-pay

## 2021-10-27 ENCOUNTER — Emergency Department (HOSPITAL_BASED_OUTPATIENT_CLINIC_OR_DEPARTMENT_OTHER)
Admission: EM | Admit: 2021-10-27 | Discharge: 2021-10-27 | Disposition: A | Discharge status: Home / Self Care | Payer: Medicare Other | Attending: Emergency Medicine | Admitting: Emergency Medicine

## 2021-10-27 DIAGNOSIS — Z87891 Personal history of nicotine dependence: Secondary | ICD-10-CM | POA: Diagnosis not present

## 2021-10-27 DIAGNOSIS — N183 Chronic kidney disease, stage 3 unspecified: Secondary | ICD-10-CM | POA: Diagnosis not present

## 2021-10-27 DIAGNOSIS — U071 COVID-19: Secondary | ICD-10-CM | POA: Diagnosis not present

## 2021-10-27 DIAGNOSIS — I130 Hypertensive heart and chronic kidney disease with heart failure and stage 1 through stage 4 chronic kidney disease, or unspecified chronic kidney disease: Secondary | ICD-10-CM | POA: Insufficient documentation

## 2021-10-27 DIAGNOSIS — I2510 Atherosclerotic heart disease of native coronary artery without angina pectoris: Secondary | ICD-10-CM | POA: Insufficient documentation

## 2021-10-27 DIAGNOSIS — R079 Chest pain, unspecified: Secondary | ICD-10-CM | POA: Diagnosis not present

## 2021-10-27 DIAGNOSIS — R059 Cough, unspecified: Secondary | ICD-10-CM | POA: Diagnosis not present

## 2021-10-27 DIAGNOSIS — I509 Heart failure, unspecified: Secondary | ICD-10-CM | POA: Insufficient documentation

## 2021-10-27 DIAGNOSIS — R0602 Shortness of breath: Secondary | ICD-10-CM | POA: Diagnosis not present

## 2021-10-27 DIAGNOSIS — R051 Acute cough: Secondary | ICD-10-CM

## 2021-10-27 LAB — CBC
HCT: 47.9 % (ref 39.0–52.0)
Hemoglobin: 15.7 g/dL (ref 13.0–17.0)
MCH: 31.5 pg (ref 26.0–34.0)
MCHC: 32.8 g/dL (ref 30.0–36.0)
MCV: 96 fL (ref 80.0–100.0)
Platelets: 273 10*3/uL (ref 150–400)
RBC: 4.99 MIL/uL (ref 4.22–5.81)
RDW: 12.7 % (ref 11.5–15.5)
WBC: 8.6 10*3/uL (ref 4.0–10.5)
nRBC: 0 % (ref 0.0–0.2)

## 2021-10-27 LAB — PROTIME-INR
INR: 1.2 (ref 0.8–1.2)
Prothrombin Time: 14.8 seconds (ref 11.4–15.2)

## 2021-10-27 LAB — COMPREHENSIVE METABOLIC PANEL
ALT: 30 U/L (ref 0–44)
AST: 39 U/L (ref 15–41)
Albumin: 3.8 g/dL (ref 3.5–5.0)
Alkaline Phosphatase: 56 U/L (ref 38–126)
Anion gap: 9 (ref 5–15)
BUN: 15 mg/dL (ref 8–23)
CO2: 26 mmol/L (ref 22–32)
Calcium: 9.1 mg/dL (ref 8.9–10.3)
Chloride: 103 mmol/L (ref 98–111)
Creatinine, Ser: 1.4 mg/dL — ABNORMAL HIGH (ref 0.61–1.24)
GFR, Estimated: 51 mL/min — ABNORMAL LOW (ref >60)
Glucose, Bld: 104 mg/dL — ABNORMAL HIGH (ref 70–99)
Potassium: 4.6 mmol/L (ref 3.5–5.1)
Sodium: 138 mmol/L (ref 135–145)
Total Bilirubin: 1 mg/dL (ref 0.3–1.2)
Total Protein: 7.1 g/dL (ref 6.5–8.1)

## 2021-10-27 LAB — TROPONIN I (HIGH SENSITIVITY): Troponin I (High Sensitivity): 29 ng/L — ABNORMAL HIGH (ref <18)

## 2021-10-27 LAB — RESP PANEL BY RT-PCR (FLU A&B, COVID) ARPGX2
Influenza A by PCR: NEGATIVE
Influenza B by PCR: NEGATIVE
SARS Coronavirus 2 by RT PCR: POSITIVE — AB

## 2021-10-27 NOTE — Discharge Instructions (Signed)
Your EKG, chest x-ray, heart enzymes look good today, better than your baseline.  Recommend that you follow-up with your primary care doctor to discuss medication changes, see if any medications are causing your nausea in particular, on my chart review I do not see any medications that I think they should stop taking at this time without consultation with your other doctors.  Please return if your symptoms worsen or fail to improve.

## 2021-10-27 NOTE — ED Triage Notes (Signed)
Patient here POV from Home with Cough, CP, and SOB.  Patient states he began having General Cough and Congestion Symptoms since last Tuesday. Patient also began having CP that radiates through Right Chest causing increase in SOB.  NAD Noted during Triage. A&Ox4. GCS 15. BIB Wheelchair.  History of MI

## 2021-10-27 NOTE — ED Provider Notes (Signed)
Emergency Medicine Provider Triage Evaluation Note  Ryan [name] , a 80 y.o. male  was evaluated in triage.  Pt complains of cough, chest pain, malaise, shortness of breath since Thursday. Pain radiates to right shoulder. No diaphoresis, lightheadedness. History of ACS x 2. Positive home covid test.  Review of Systems  Positive: Chest pain, SOB Negative: Vomiting, diaphoresis  Physical Exam  BP (!) 179/94 (BP Location: Right Arm)   Pulse 60   Temp 97.6 F (36.4 C) (Oral)   Resp 16   SpO2 100%  Gen:   Awake, no distress   Resp:  Normal effort, some pain with deep inspiration MSK:   Moves extremities without difficulty  Other:  Minimal TTP epigastric region  Medical Decision Making  Medically screening exam initiated at 1:02 PM.  Appropriate orders placed.  Ryan [name] was informed that the remainder of the evaluation will be completed by another provider, this initial triage assessment does not replace that evaluation, and the importance of remaining in the ED until their evaluation is complete.  Chest pain since Thursday, stable VS, positive home COVID   Dorien Chihuahua 10/27/21 1303    Tegeler, Gwenyth Allegra, MD 10/27/21 [phone]

## 2021-10-27 NOTE — ED Provider Notes (Signed)
[facility] EMERGENCY DEPT Provider Note   CSN: [id number] Arrival date & time: 10/27/21  1242     History Chief Complaint  Patient presents with   Cough    Ryan [name] is a 80 y.o. male with a past medical history significant for coronary artery disease, congestive heart failure, heart dysrhythmia with flutter, fibrillation, pacemaker, recent home diagnosis for COVID, currently taking pack Slo-Bid who presents today with cough, some chest pain at 730, 8 AM this morning, with lesion to right shoulder.  Patient denies diaphoresis, lightheadedness, nausea, vomiting.  Patient does endorse some occasional nausea when taking medications.  Patient denies any constipation, diarrhea.   Cough Associated symptoms: chest pain       Past Medical History:  Diagnosis Date   Arthritis    CAD (coronary artery disease)    Carotid artery occlusion    Cataract    Bil eyes/worse in left eye   CHF (congestive heart failure) (HCC)    Chronic back pain    DVT (deep venous thrombosis) (HCC)    Dysrhythmia    Enlarged prostate    takes Rapaflo daily   GERD (gastroesophageal reflux disease)    occasional   History of colon polyps    History of gout    has colchicine prn   History of kidney stones    Hyperlipidemia    takes Crestor daily   Hypertension    takes Amlodipine daily   Hypothyroidism    Myocardial infarction [facility])    Peripheral vascular disease ([place])    Pulmonary emboli ([place]) 03/20/2015   elevated d-dimer, intermediate V/Q study, atypical chest pain and SOB. Start on Xarelto 20mg  BID for 3 month   Rapid atrial fibrillation (HCC)    Renal insufficiency    Shortness of breath dyspnea    Urinary frequency    Urinary urgency     Patient Active Problem List   Diagnosis Date Noted   Intractable nausea and vomiting 09/10/2021   Hypoxemia    CHF exacerbation ([place]) 03/23/2021   Acute exacerbation of CHF (congestive heart failure) ([place]) 03/22/2021   Status post  implantation of automatic cardioverter/defibrillator (AICD) 03/22/2021   Ventricular tachycardia 12/28/2020   CHF (congestive heart failure) ([place]) 12/28/2020   NSTEMI (non-ST elevated myocardial infarction) ([place])    Acute respiratory failure with hypoxia ([place])    S/P total hip arthroplasty 08/16/2019   Unilateral primary osteoarthritis, right hip 07/18/2019   PAF (paroxysmal atrial fibrillation) ([place]) 01/27/2019   Dizziness 01/27/2019   History of pneumonia 01/27/2019   CKD (chronic kidney disease) stage 3, GFR 30-59 ml/min (HCC) 01/27/2019   Chest pain 02/07/2018   Elevated troponin 03/23/2017   Sigmoid diverticulitis 03/23/2017   Acute pyelonephritis 03/23/2017   Acute kidney injury superimposed on chronic kidney disease ([place]) 03/23/2017   Wrist pain, acute, right    Subclinical hypothyroidism    Dyspnea 02/26/2017   Shoulder blade pain 02/26/2017   SOB (shortness of breath) 02/26/2017   Chronic right shoulder pain    Chronic combined systolic and diastolic CHF (congestive heart failure) ([place])    Hyperlipidemia LDL goal <70 01/20/2017   Chronic cholecystitis 07/27/2016   PAD (peripheral artery disease) ([place]) 07/09/2015   S/P CABG x 4 04/05/2015   Coronary artery disease involving native coronary artery with unstable angina pectoris ([place])    Atypical chest pain 03/20/2015   Carotid artery disease ([place]) 10/09/2014   PVD (peripheral vascular disease) ([place]) 10/09/2014   HTN (hypertension) 05/30/2014   Arthritis  of left hip 01/09/2014   Status post THR (total hip replacement) 01/09/2014   Spinal stenosis, lumbar 01/06/2013    Class: Diagnosis of   Coronary artery disease 01/05/2013   Atherosclerosis of native artery of extremity with intermittent claudication ([place]) 10/18/2012    Past Surgical History:  Procedure Laterality Date   APPENDECTOMY     BACK SURGERY     5 times   big toe surgery     CARDIAC CATHETERIZATION     2010    dr Acie Fredrickson   cataract surgery     left eye    CHOLECYSTECTOMY N/A 07/27/2016   Procedure: LAPAROSCOPIC CHOLECYSTECTOMY;  Surgeon: Mickeal Skinner, MD;  Location: [facility];  Service: General;  Laterality: N/A;   COLONOSCOPY     CORONARY ARTERY BYPASS GRAFT N/A 04/05/2015   Procedure: CORONARY ARTERY BYPASS GRAFTING (CABG)X4 LIMA-LAD; SVG-DIAG1-DIAG2; SVG-PD;  Surgeon: [name], MD;  Location: [facility];  Service: Open Heart Surgery;  Laterality: N/A;   CORONARY/GRAFT ANGIOGRAPHY N/A 04/22/2018   Procedure: CORONARY/GRAFT ANGIOGRAPHY;  Surgeon: Martinique, Peter M, MD;  Location: [facility] [place] CV LAB;  Service: Cardiovascular;  Laterality: N/A;   CYSTOSCOPY     ENDARTERECTOMY Left 04/24/2016   Procedure: ENDARTERECTOMY LEFT CAROTID;  Surgeon: Mal Misty, MD;  Location: [facility];  Service: Vascular;  Laterality: Left;   EYE SURGERY     FEMORAL ARTERY - POPLITEAL ARTERY BYPASS GRAFT     ICD IMPLANT N/A 12/30/2020   Procedure: ICD IMPLANT;  Surgeon: Evans Lance, MD;  Location: [facility] CV LAB;  Service: Cardiovascular;  Laterality: N/A;   JOINT REPLACEMENT     shoulder   LEFT HEART CATHETERIZATION WITH CORONARY ANGIOGRAM N/A 04/03/2015   Procedure: LEFT HEART CATHETERIZATION WITH CORONARY ANGIOGRAM;  Surgeon: Troy Sine, MD;  Location[facility] CATH LAB;  Service: Cardiovascular;  Laterality: N/A;   LUMBAR LAMINECTOMY  01/06/2013   Procedure: MICRODISCECTOMY LUMBAR LAMINECTOMY;  Surgeon: Marybelle Killings, MD;  Location: [facility];  Service: Orthopedics;  Laterality: N/A;  L3-4 decompression   LUMBAR LAMINECTOMY/DECOMPRESSION MICRODISCECTOMY  02/12/2012   Procedure: LUMBAR LAMINECTOMY/DECOMPRESSION MICRODISCECTOMY;  Surgeon: Floyce Stakes, MD;  Location: [place] NEURO ORS;  Service: Neurosurgery;  Laterality: N/A;  Lumbar four-five laminectomy   PATCH ANGIOPLASTY Left 04/24/2016   Procedure: LEFT CAROTID ARTERY PATCH ANGIOPLASTY;  Surgeon: Mal Misty, MD;  Location: [facility];  Service: Vascular;  Laterality: Left;   RIGHT HEART CATH AND CORONARY/GRAFT  ANGIOGRAPHY N/A 01/30/2019   Procedure: RIGHT HEART CATH AND CORONARY/GRAFT ANGIOGRAPHY;  Surgeon: Troy Sine, MD;  Location: [place] CV LAB;  Service: Cardiovascular;  Laterality: N/A;   STERIOD INJECTION Right 01/09/2014   Procedure: STEROID INJECTION;  Surgeon: Mcarthur Rossetti, MD;  Location: [facility];  Service: Orthopedics;  Laterality: Right;   TEE WITHOUT CARDIOVERSION N/A 04/05/2015   Procedure: TRANSESOPHAGEAL ECHOCARDIOGRAM (TEE);  Surgeon: [name], MD;  Location: [facility];  Service: Open Heart Surgery;  Laterality: N/A;   TOTAL HIP ARTHROPLASTY Left 01/09/2014   DR Ninfa Linden   TOTAL HIP ARTHROPLASTY Left 01/09/2014   Procedure: LEFT TOTAL HIP ARTHROPLASTY ANTERIOR APPROACH and Steroid Injection Right hip;  Surgeon: Mcarthur Rossetti, MD;  Location: [facility];  Service: Orthopedics;  Laterality: Left;   TOTAL HIP ARTHROPLASTY Right 08/15/2019   TOTAL HIP ARTHROPLASTY Right 08/15/2019   Procedure: RIGHT TOTAL HIP ARTHROPLASTY ANTERIOR APPROACH;  Surgeon: Mcarth[place] Rossetti, MD;  Location: [facility];  Service: Orthopedics;  Laterality: Right;  Family History  Problem Relation Age of Onset   Heart disease Father    Heart attack Father    Heart disease Sister    Hypertension Sister    Heart attack Sister    Hypertension Mother    Diabetes Son     Social History   Tobacco Use   Smoking status: Former    Types: Cigarettes    Quit date: 02/04/1987    Years since quitting: 34.7   Smokeless tobacco: Former    Types: Chew    Quit date: 07/20/2009   Tobacco comments:    quit 35+yrs ago  Vaping Use   Vaping Use: Never used  Substance Use Topics   Alcohol use: No    Alcohol/week: 0.0 standard drinks   Drug use: No    Home Medications Prior to Admission medications   Medication Sig Start Date End Date Taking? Authorizing Provider  allopurinol (ZYLOPRIM) 100 MG tablet Take 100 mg by mouth daily as needed (gout). 05/29/20   [provider]   ALPRAZolam Duanne Moron) 0.5 MG tablet Take 0.5 mg by mouth 3 (three) times daily as needed. 10/07/21   [provider]  amiodarone (PACERONE) 200 MG tablet Take 1 tablet (200 mg total) by mouth daily. 09/11/21   Mercy Riding, MD  apixaban (ELIQUIS) 2.5 MG TABS tablet Take 1 tablet (2.5 mg total) by mouth 2 (two) times daily. 10/16/21 04/14/22  Loel Dubonnet, NP  carvedilol (COREG) 3.125 MG tablet Take 1 tablet (3.125 mg total) by mouth 2 (two) times daily with a meal. 10/16/21 04/14/22  Loel Dubonnet, NP  finasteride (PROSCAR) 5 MG tablet Take 5 mg by mouth daily. 08/18/21   [provider]  furosemide (LASIX) 20 MG tablet Take 1 tablet (20 mg total) by mouth daily. 10/16/21 04/14/22  Loel Dubonnet, NP  levothyroxine (SYNTHROID) 25 MCG tablet Take 25 mcg by mouth daily. 07/02/21   [provider]  nitroGLYCERIN (NITROSTAT) 0.4 MG SL tablet Place 1 tablet (0.4 mg total) under the tongue every 5 (five) minutes as needed for chest pain. 07/09/21   Evans Lance, MD  ondansetron (ZOFRAN) 4 MG tablet Take 4 mg by mouth every 8 (eight) hours as needed for nausea/vomiting. 09/09/21   [provider]  pantoprazole (PROTONIX) 40 MG tablet Take 40 mg by mouth daily. 09/30/21   [provider]  REPATHA SURECLICK 027 MG/ML SOAJ INJECT 1 PEN INTO THE SKIN EVERY 14 DAYS. 09/16/21   [name], [name], MD  spironolactone (ALDACTONE) 25 MG tablet Take 0.5 tablets (12.5 mg total) by mouth daily. 10/16/21 10/11/22  Loel Dubonnet, NP  tamsulosin (FLOMAX) 0.4 MG CAPS capsule Take 0.4 mg by mouth at bedtime. 03/01/17   [provider]    Allergies    Zetia [ezetimibe], Codeine, and Unithroid [levothyroxine sodium]  Review of Systems   Review of Systems  Respiratory:  Positive for cough.   Cardiovascular:  Positive for chest pain.  All other systems reviewed and are negative.  Physical Exam Updated Vital Signs BP (!) 198/102   Pulse 85   Temp 97.9 F  (36.6 C) (Oral)   Resp 16   Ht 5\' 6"  (1.676 m)   Wt 67.6 kg   SpO2 100%   BMI 24.05 kg/m   Physical Exam Vitals and nursing note reviewed.  Constitutional:      General: He is not in acute distress.    Appearance: Normal appearance.  HENT:  Head: Normocephalic and atraumatic.  Eyes:     General:        Right eye: No discharge.        Left eye: No discharge.  Cardiovascular:     Rate and Rhythm: Normal rate and regular rhythm.     Heart sounds: No murmur heard.   No friction rub. No gallop.  Pulmonary:     Effort: Pulmonary effort is normal.     Breath sounds: Normal breath sounds.  Abdominal:     General: Bowel sounds are normal.     Palpations: Abdomen is soft.     Comments: Minimal tenderness to palpation diffusely, no focal tenderness, no rebound, rigidity, guarding  Skin:    General: Skin is warm and dry.     Capillary Refill: Capillary refill takes less than 2 seconds.  Neurological:     Mental Status: He is alert and oriented to person, place, and time.  Psychiatric:        Mood and Affect: Mood normal.        Behavior: Behavior normal.    ED Results / Procedures / Treatments   Labs (all labs ordered are listed, but only abnormal results are displayed) Labs Reviewed  RESP PANEL BY RT-PCR (FLU A&B, COVID) ARPGX2 - Abnormal; Notable for the following components:      Result Value   SARS Coronavirus 2 by RT PCR POSITIVE (*)    All other components within normal limits  COMPREHENSIVE METABOLIC PANEL - Abnormal; Notable for the following components:   Glucose, Bld 104 (*)    Creatinine, Ser 1.40 (*)    GFR, Estimated 51 (*)    All other components within normal limits  TROPONIN I (HIGH SENSITIVITY) - Abnormal; Notable for the following components:   Troponin I (High Sensitivity) 29 (*)    All other components within normal limits  CBC  PROTIME-INR    EKG EKG Interpretation  Date/Time:  Monday October 27 2021 13:02:17 EST Ventricular Rate:  63 PR  Interval:    QRS Duration: 80 QT Interval:  472 QTC Calculation: 483 R Axis:   101 Text Interpretation: Atrial fibrillation Rightward axis Similar to prior Confirmed by Wynona Dove ([id number]) on 10/27/2021 4:55:40 PM  Radiology DG Chest Portable 1 View  Result Date: 10/27/2021 CLINICAL DATA:  Cough, chest pain, shortness of breath EXAM: PORTABLE CHEST 1 VIEW COMPARISON:  Portable exam 1307 hours compared to 12/10/2021 FINDINGS: LEFT subclavian ICD with lead projecting over RIGHT ventricle. Normal heart size post CABG. Mediastinal contours and pulmonary vascularity normal. Atherosclerotic calcification aorta. Lungs clear. No acute infiltrate, pleural effusion, or pneumothorax. Bones demineralized with chronic RIGHT rotator cuff tear, LEFT shoulder prosthesis, and a fractures screw at the inferior LEFT glenoid. IMPRESSION: Post CABG and ICD. No acute abnormalities. Aortic Atherosclerosis (ICD10-I70.0). Electronically Signed   By: Lavonia Dana M.D.   On: 10/27/2021 13:20    Procedures Procedures   Medications Ordered in ED Medications - No data to display  ED Course  I have reviewed the triage vital signs and the nursing notes.  Pertinent labs & imaging results that were available during my care of the patient were reviewed by me and considered in my medical decision making (see chart for details).    MDM Rules/Calculators/A&P                         I discussed this case with my attending physician who cosigned this note including patient's  presenting symptoms, physical exam, and planned diagnostics and interventions. Attending physician stated agreement with plan or made changes to plan which were implemented.   Attending physician assessed patient at bedside.  Given the large differential diagnosis for JAQUANN GUARISCO, the decision making in this case is of high complexity.  After evaluating all of the data points in this case, the presentation of ALAKAI MACBRIDE is NOT consistent with  Acute Coronary Syndrome (ACS) and/or myocardial ischemia, pulmonary embolism, aortic dissection; Borhaave's, significant arrythmia, pneumothorax, cardiac tamponade, or other emergent cardiopulmonary condition.  Further, the presentation of BRYCESON GRAPE is NOT consistent with pericarditis, myocarditis, cholecystitis, pancreatitis, mediastinitis, endocarditis, new valvular disease.  Additionally, the presentation of Arville Postlewaite Kendrickis NOT consistent with flail chest, cardiac contusion, ARDS, or significant intra-thoracic or intra-abdominal bleeding.  Moreover, this presentation is NOT consistent with pneumonia, sepsis, or pyelonephritis.  The patient has a  pleuritic chest pain with cough consistent with COVID 19 infection. SpO2 100% throughout evaluation.  Overall well-appearing patient in no respiratory distress, with occasional cough consistent with COVID.  Vital signs stable, hypertension noted.  No headache, vision changes, no chest pain since 7:00 this morning, no nausea, vomiting at time of evaluation.  Initial lab work without abnormality, troponin elevated at 29 which is less than patient's baseline of around 40, no delta Trope needed at this time as chest pain occurred greater than 3 hours from first troponin.  Chest x-ray without abnormality, EKG stable compared to baseline.  Patient experiencing normal sequelae of COVID infection, already took paxlovid  Patient has some concerns about his medications potentially causing him nausea, discussed that is important for him to follow-up with this with his primary care doctor.  Patient discharged in stable condition, return precautions given. Final Clinical Impression(s) / ED Diagnoses Final diagnoses:  COVID  Acute cough    Rx / DC Orders ED Discharge Orders     None        Anselmo Pickler, PA-C 10/27/21 [place], [place], DO 10/28/21 [phone]

## 2021-10-29 ENCOUNTER — Ambulatory Visit (INDEPENDENT_AMBULATORY_CARE_PROVIDER_SITE_OTHER): Payer: Medicare Other | Admitting: Vascular Surgery

## 2021-10-29 ENCOUNTER — Ambulatory Visit (HOSPITAL_COMMUNITY): Payer: Medicare Other

## 2021-10-29 ENCOUNTER — Other Ambulatory Visit: Payer: Self-pay | Admitting: Vascular Surgery

## 2021-10-29 ENCOUNTER — Ambulatory Visit: Payer: Medicare Other | Admitting: Vascular Surgery

## 2021-10-29 ENCOUNTER — Encounter: Payer: Self-pay | Admitting: Vascular Surgery

## 2021-10-29 ENCOUNTER — Ambulatory Visit (INDEPENDENT_AMBULATORY_CARE_PROVIDER_SITE_OTHER)
Admission: RE | Admit: 2021-10-29 | Discharge: 2021-10-29 | Disposition: A | Discharge status: Home / Self Care | Payer: Medicare Other | Source: Ambulatory Visit | Attending: Vascular Surgery | Admitting: Vascular Surgery

## 2021-10-29 ENCOUNTER — Other Ambulatory Visit: Payer: Self-pay

## 2021-10-29 VITALS — BP 156/77 | HR 61 | Temp 98.3°F | Resp 20 | Ht 66.0 in | Wt 145.0 lb

## 2021-10-29 DIAGNOSIS — I5032 Chronic diastolic (congestive) heart failure: Secondary | ICD-10-CM | POA: Diagnosis not present

## 2021-10-29 DIAGNOSIS — I739 Peripheral vascular disease, unspecified: Secondary | ICD-10-CM

## 2021-10-29 DIAGNOSIS — R0602 Shortness of breath: Secondary | ICD-10-CM | POA: Diagnosis not present

## 2021-10-29 DIAGNOSIS — J189 Pneumonia, unspecified organism: Secondary | ICD-10-CM | POA: Diagnosis not present

## 2021-10-29 DIAGNOSIS — J1282 Pneumonia due to coronavirus disease 2019: Secondary | ICD-10-CM | POA: Diagnosis not present

## 2021-10-29 DIAGNOSIS — J159 Unspecified bacterial pneumonia: Secondary | ICD-10-CM | POA: Diagnosis not present

## 2021-10-29 DIAGNOSIS — U071 COVID-19: Secondary | ICD-10-CM | POA: Diagnosis not present

## 2021-10-29 DIAGNOSIS — I130 Hypertensive heart and chronic kidney disease with heart failure and stage 1 through stage 4 chronic kidney disease, or unspecified chronic kidney disease: Secondary | ICD-10-CM | POA: Diagnosis not present

## 2021-10-29 DIAGNOSIS — D6859 Other primary thrombophilia: Secondary | ICD-10-CM | POA: Diagnosis not present

## 2021-10-29 NOTE — Progress Notes (Signed)
Patient ID: Ryan [name], male   DOB: 1941/02/19, 80 y.o.   MRN: [id number]  Reason for Consult: Follow-up   Referred by Ginger Organ., MD  Subjective:     HPI:  Ryan [name] is a 80 y.o. male history of left carotid endarterectomy by Dr. Kellie Simmering also has history of aortobifemoral bypass grafting.  Recently complained of right-sided tinnitus has CT scan which demonstrated right-sided carotid artery disease which has been known.  He is not taking any aspirin at this time but he is on Eliquis.  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  He walks without any limitation.  Denies any previous right-sided stroke, TIA or amaurosis.  No current abdominal or back pain.  No real complaints other than tinnitus on today's exam.  Past Medical History:  Diagnosis Date   Arthritis    CAD (coronary artery disease)    Carotid artery occlusion    Cataract    Bil eyes/worse in left eye   CHF (congestive heart failure) (HCC)    Chronic back pain    DVT (deep venous thrombosis) (HCC)    Dysrhythmia    Enlarged prostate    takes Rapaflo daily   GERD (gastroesophageal reflux disease)    occasional   History of colon polyps    History of gout    has colchicine prn   History of kidney stones    Hyperlipidemia    takes Crestor daily   Hypertension    takes Amlodipine daily   Hypothyroidism    Myocardial infarction [facility])    Peripheral vascular disease ([place])    Pulmonary emboli ([place]) 03/20/2015   elevated d-dimer, intermediate V/Q study, atypical chest pain and SOB. Start on Xarelto 20mg  BID for 3 month   Rapid atrial fibrillation (HCC)    Renal insufficiency    Shortness of breath dyspnea    Urinary frequency    Urinary urgency    Family History  Problem Relation Age of Onset   Heart disease Father    Heart attack Father    Heart disease Sister    Hypertension Sister    Heart attack Sister    Hypertension Mother    Diabetes Son    Past Surgical History:  Procedure Laterality  Date   APPENDECTOMY     BACK SURGERY     5 times   big toe surgery     CARDIAC CATHETERIZATION     2010    dr Acie Fredrickson   cataract surgery     left eye   CHOLECYSTECTOMY N/A 07/27/2016   Procedure: LAPAROSCOPIC CHOLECYSTECTOMY;  Surgeon: Mickeal Skinner, MD;  Location: [place];  Service: General;  Laterality: N/A;   COLONOSCOPY     CORONARY ARTERY BYPASS GRAFT N/A 04/05/2015   Procedure: CORONARY ARTERY BYPASS GRAFTING (CABG)X4 LIMA-LAD; SVG-DIAG1-DIAG2; SVG-PD;  Surgeon: [name], MD;  Location: [place];  Service: Open Heart Surgery;  Laterality: N/A;   CORONARY/GRAFT ANGIOGRAPHY N/A 04/22/2018   Procedure: CORONARY/GRAFT ANGIOGRAPHY;  Surgeon: Martinique, Peter M, MD;  Location: [place] CV LAB;  Service: Cardiovascular;  Laterality: N/A;   CYSTOSCOPY     ENDARTERECTOMY Left 04/24/2016   Procedure: ENDARTERECTOMY LEFT CAROTID;  Surgeon: Mal Misty, MD;  Location: [place];  Service: Vascular;  Laterality: Left;   EYE SURGERY     FEMORAL ARTERY - POPLITEAL ARTERY BYPASS GRAFT     ICD IMPLANT N/A 12/30/2020   Procedure: ICD IMPLANT;  Surgeon: Evans Lance, MD;  Location: [place] CV LAB;  Service: Cardiovascular;  Laterality: N/A;   JOINT REPLACEMENT     shoulder   LEFT HEART CATHETERIZATION WITH CORONARY ANGIOGRAM N/A 04/03/2015   Procedure: LEFT HEART CATHETERIZATION WITH CORONARY ANGIOGRAM;  Surgeon: Troy Sine, MD;  Location: [facility] CATH LAB;  Service: Cardiovascular;  Laterality: N/A;   LUMBAR LAMINECTOMY  01/06/2013   Procedure: MICRODISCECTOMY LUMBAR LAMINECTOMY;  Surgeon: Marybelle Killings, MD;  Location: [place];  Service: Orthopedics;  Laterality: N/A;  L3-4 decompression   LUMBAR LAMINECTOMY/DECOMPRESSION MICRODISCECTOMY  02/12/2012   Procedure: LUMBAR LAMINECTOMY/DECOMPRESSION MICRODISCECTOMY;  Surgeon: Floyce Stakes, MD;  Location: [place] NEURO ORS;  Service: Neurosurgery;  Laterality: N/A;  Lumbar four-five laminectomy   PATCH ANGIOPLASTY Left 04/24/2016   Procedure: LEFT  CAROTID ARTERY PATCH ANGIOPLASTY;  Surgeon: Mal Misty, MD;  Location: [place];  Service: Vascular;  Laterality: Left;   RIGHT HEART CATH AND CORONARY/GRAFT ANGIOGRAPHY N/A 01/30/2019   Procedure: RIGHT HEART CATH AND CORONARY/GRAFT ANGIOGRAPHY;  Surgeon: Troy Sine, MD;  Location: [place] CV LAB;  Service: Cardiovascular;  Laterality: N/A;   STERIOD INJECTION Right 01/09/2014   Procedure: STEROID INJECTION;  Surgeon: Mcarthur Rossetti, MD;  Location: [place];  Service: Orthopedics;  Laterality: Right;   TEE WITHOUT CARDIOVERSION N/A 04/05/2015   Procedure: TRANSESOPHAGEAL ECHOCARDIOGRAM (TEE);  Surgeon: [name], MD;  Location: [place];  Service: Open Heart Surgery;  Laterality: N/A;   TOTAL HIP ARTHROPLASTY Left 01/09/2014   DR Ninfa Linden   TOTAL HIP ARTHROPLASTY Left 01/09/2014   Procedure: LEFT TOTAL HIP ARTHROPLASTY ANTERIOR APPROACH and Steroid Injection Right hip;  Surgeon: Mcarthur Rossetti, MD;  Location: [place];  Service: Orthopedics;  Laterality: Left;   TOTAL HIP ARTHROPLASTY Right 08/15/2019   TOTAL HIP ARTHROPLASTY Right 08/15/2019   Procedure: RIGHT TOTAL HIP ARTHROPLASTY ANTERIOR APPROACH;  Surgeon: Mcarthur Rossetti, MD;  Location: [place];  Service: Orthopedics;  Laterality: Right;    Short Social History:  Social History   Tobacco Use   Smoking status: Former    Types: Cigarettes    Quit date: 02/04/1987    Years since quitting: 34.7   Smokeless tobacco: Former    Types: Chew    Quit date: 07/20/2009   Tobacco comments:    quit 35+yrs ago  Substance Use Topics   Alcohol use: No    Alcohol/week: 0.0 standard drinks    Allergies  Allergen Reactions   Zetia [Ezetimibe] Other (See Comments)    Myalgias    Codeine Nausea And Vomiting        Unithroid [Levothyroxine Sodium] Other (See Comments)    Caused blurry vision per pt    Current Outpatient Medications  Medication Sig Dispense Refill   allopurinol (ZYLOPRIM) 100 MG tablet Take  100 mg by mouth daily as needed (gout).     ALPRAZolam (XANAX) 0.5 MG tablet Take 0.5 mg by mouth 3 (three) times daily as needed.     amiodarone (PACERONE) 200 MG tablet Take 1 tablet (200 mg total) by mouth daily. 90 tablet 1   apixaban (ELIQUIS) 2.5 MG TABS tablet Take 1 tablet (2.5 mg total) by mouth 2 (two) times daily. 60 tablet 5   carvedilol (COREG) 3.125 MG tablet Take 1 tablet (3.125 mg total) by mouth 2 (two) times daily with a meal. 180 tablet 1   finasteride (PROSCAR) 5 MG tablet Take 5 mg by mouth daily.     furosemide (LASIX) 20 MG tablet Take 1 tablet (  20 mg total) by mouth daily. 90 tablet 1   levothyroxine (SYNTHROID) 25 MCG tablet Take 25 mcg by mouth daily.     nitroGLYCERIN (NITROSTAT) 0.4 MG SL tablet Place 1 tablet (0.4 mg total) under the tongue every 5 (five) minutes as needed for chest pain. 25 tablet 3   ondansetron (ZOFRAN) 4 MG tablet Take 4 mg by mouth every 8 (eight) hours as needed for nausea/vomiting.     pantoprazole (PROTONIX) 40 MG tablet Take 40 mg by mouth daily.     REPATHA SURECLICK 353 MG/ML SOAJ INJECT 1 PEN INTO THE SKIN EVERY 14 DAYS. 2 mL 11   spironolactone (ALDACTONE) 25 MG tablet Take 0.5 tablets (12.5 mg total) by mouth daily. 45 tablet 3   tamsulosin (FLOMAX) 0.4 MG CAPS capsule Take 0.4 mg by mouth at bedtime.     No current facility-administered medications for this visit.    Review of Systems  Constitutional:  Constitutional negative. HENT:       tinnitus Eyes: Eyes negative.  Respiratory: Respiratory negative.  Cardiovascular: Cardiovascular negative.  GI: Gastrointestinal negative.  Musculoskeletal: Musculoskeletal negative.  Skin: Skin negative.  Neurological: Neurological negative. Hematologic: Hematologic/lymphatic negative.  Psychiatric: Psychiatric negative.       Objective:  Objective   Vitals:   10/29/21 1208 10/29/21 1210  BP: (!) 148/87 (!) 156/77  Pulse: 61   Resp: 20   Temp: 98.3 F (36.8 C)   SpO2: 96%    Weight: 145 lb (65.8 kg)   Height: 5\' 6"  (1.676 m)    Body mass index is 23.4 kg/m.  Physical Exam HENT:     Head: Normocephalic.     Nose:     Comments: Wearing a mask Eyes:     Pupils: Pupils are equal, round, and reactive to light.  Neck:     Vascular: No carotid bruit.  Cardiovascular:     Pulses:          Radial pulses are 2+ on the right side and 2+ on the left side.       Popliteal pulses are 2+ on the right side and 3+ on the left side.  Pulmonary:     Effort: Pulmonary effort is normal.     Breath sounds: Normal breath sounds.  Abdominal:     General: Abdomen is flat.     Palpations: Abdomen is soft. There is no mass.  Musculoskeletal:        General: Normal range of motion.     Cervical back: Normal range of motion and neck supple.  Skin:    General: Skin is warm and dry.     Capillary Refill: Capillary refill takes less than 2 seconds.  Neurological:     General: No focal deficit present.     Mental Status: He is alert.  Psychiatric:        Mood and Affect: Mood normal.        Behavior: Behavior normal.        Thought Content: Thought content normal.        Judgment: Judgment normal.    Data: Right Carotid Findings:  +----------+--------+--------+--------+------------------+--------+            PSV cm/sEDV cm/sStenosisPlaque DescriptionComments  +----------+--------+--------+--------+------------------+--------+  CCA Prox  63      16              heterogenous                +----------+--------+--------+--------+------------------+--------+  CCA Mid  65      18              homogeneous                 +----------+--------+--------+--------+------------------+--------+  CCA Distal47      10              heterogenous                +----------+--------+--------+--------+------------------+--------+  ICA Prox  172     44      40-59%  calcific                     +----------+--------+--------+--------+------------------+--------+  ICA Mid   166     38                                          +----------+--------+--------+--------+------------------+--------+  ICA Distal82      19                                          +----------+--------+--------+--------+------------------+--------+  ECA       202     21              calcific                    +----------+--------+--------+--------+------------------+--------+   +----------+--------+-------+----------------+-------------------+            PSV cm/sEDV cmsDescribe        Arm Pressure (mmHG)  +----------+--------+-------+----------------+-------------------+  [id number]             Multiphasic, WNL                     +----------+--------+-------+----------------+-------------------+   +---------+--------+--+--------+-+---------+  VertebralPSV cm/s47EDV cm/s7Antegrade  +---------+--------+--+--------+-+---------+       Left Carotid Findings:  +----------+--------+--------+--------+------------------+--------+            PSV cm/sEDV cm/sStenosisPlaque DescriptionComments  +----------+--------+--------+--------+------------------+--------+  CCA Prox  69      11                                          +----------+--------+--------+--------+------------------+--------+  CCA Mid   71      15              homogeneous                 +----------+--------+--------+--------+------------------+--------+  CCA Distal112     13              homogeneous                 +----------+--------+--------+--------+------------------+--------+  ICA Prox  108     12      1-39%   homogeneous                 +----------+--------+--------+--------+------------------+--------+  ICA Mid   58      11                                          +----------+--------+--------+--------+------------------+--------+  ICA Distal75  11                                           +----------+--------+--------+--------+------------------+--------+  ECA       72      9                                           +----------+--------+--------+--------+------------------+--------+   +----------+--------+--------+----------------+-------------------+            PSV cm/sEDV cm/sDescribe        Arm Pressure (mmHG)  +----------+--------+--------+----------------+-------------------+  Subclavian109             Multiphasic, WNL                     +----------+--------+--------+----------------+-------------------+   +---------+--------+--+--------+--+---------+  VertebralPSV cm/s75EDV cm/s11Antegrade  +---------+--------+--+--------+--+---------+           Summary:  Right Carotid: Velocities in the right ICA are consistent with a 40-59%                 stenosis.   Left Carotid: Velocities in the left ICA are consistent with a 1-39%  stenosis.   Vertebrals:  Bilateral vertebral arteries demonstrate antegrade flow.  Subclavians: Normal flow hemodynamics were seen in bilateral subclavian               arteries.      Assessment/Plan:    80 year old male with history of left carotid endarterectomy now with 40 to 59% stenosis by duplex of his right carotid and was 60% by CT angio recently evaluated for tinnitus.  Also has history of aortobifemoral bypass and a 3+ palpable left popliteal pulse.  He will follow-up in 1 year with repeat carotid duplex and left lower extremity popliteal artery duplex as well.      Waynetta Sandy MD [facility]

## 2021-10-30 DIAGNOSIS — R110 Nausea: Secondary | ICD-10-CM | POA: Diagnosis not present

## 2021-10-30 DIAGNOSIS — U071 COVID-19: Secondary | ICD-10-CM | POA: Diagnosis not present

## 2021-10-31 ENCOUNTER — Other Ambulatory Visit: Payer: Self-pay

## 2021-10-31 ENCOUNTER — Inpatient Hospital Stay (HOSPITAL_BASED_OUTPATIENT_CLINIC_OR_DEPARTMENT_OTHER)
Admission: EM | Admit: 2021-10-31 | Discharge: 2021-11-02 | DRG: 177 | Disposition: A | Discharge status: Home / Self Care | Payer: Medicare Other | Attending: Family Medicine | Admitting: Family Medicine

## 2021-10-31 ENCOUNTER — Encounter (HOSPITAL_BASED_OUTPATIENT_CLINIC_OR_DEPARTMENT_OTHER): Payer: Self-pay | Admitting: *Deleted

## 2021-10-31 ENCOUNTER — Emergency Department (HOSPITAL_BASED_OUTPATIENT_CLINIC_OR_DEPARTMENT_OTHER): Payer: Medicare Other | Admitting: Radiology

## 2021-10-31 ENCOUNTER — Emergency Department (HOSPITAL_BASED_OUTPATIENT_CLINIC_OR_DEPARTMENT_OTHER): Payer: Medicare Other

## 2021-10-31 DIAGNOSIS — Z79890 Hormone replacement therapy (postmenopausal): Secondary | ICD-10-CM

## 2021-10-31 DIAGNOSIS — I4819 Other persistent atrial fibrillation: Secondary | ICD-10-CM | POA: Diagnosis present

## 2021-10-31 DIAGNOSIS — I252 Old myocardial infarction: Secondary | ICD-10-CM | POA: Diagnosis not present

## 2021-10-31 DIAGNOSIS — E861 Hypovolemia: Secondary | ICD-10-CM | POA: Diagnosis present

## 2021-10-31 DIAGNOSIS — I5042 Chronic combined systolic (congestive) and diastolic (congestive) heart failure: Secondary | ICD-10-CM | POA: Diagnosis present

## 2021-10-31 DIAGNOSIS — I5043 Acute on chronic combined systolic (congestive) and diastolic (congestive) heart failure: Secondary | ICD-10-CM | POA: Diagnosis present

## 2021-10-31 DIAGNOSIS — Z951 Presence of aortocoronary bypass graft: Secondary | ICD-10-CM

## 2021-10-31 DIAGNOSIS — Z888 Allergy status to other drugs, medicaments and biological substances status: Secondary | ICD-10-CM | POA: Diagnosis not present

## 2021-10-31 DIAGNOSIS — Z95810 Presence of automatic (implantable) cardiac defibrillator: Secondary | ICD-10-CM | POA: Diagnosis not present

## 2021-10-31 DIAGNOSIS — J1282 Pneumonia due to coronavirus disease 2019: Secondary | ICD-10-CM | POA: Diagnosis present

## 2021-10-31 DIAGNOSIS — N1831 Chronic kidney disease, stage 3a: Secondary | ICD-10-CM | POA: Diagnosis present

## 2021-10-31 DIAGNOSIS — Z86711 Personal history of pulmonary embolism: Secondary | ICD-10-CM

## 2021-10-31 DIAGNOSIS — I517 Cardiomegaly: Secondary | ICD-10-CM | POA: Diagnosis not present

## 2021-10-31 DIAGNOSIS — N400 Enlarged prostate without lower urinary tract symptoms: Secondary | ICD-10-CM | POA: Diagnosis present

## 2021-10-31 DIAGNOSIS — J189 Pneumonia, unspecified organism: Secondary | ICD-10-CM | POA: Diagnosis present

## 2021-10-31 DIAGNOSIS — Z87891 Personal history of nicotine dependence: Secondary | ICD-10-CM

## 2021-10-31 DIAGNOSIS — I2583 Coronary atherosclerosis due to lipid rich plaque: Secondary | ICD-10-CM

## 2021-10-31 DIAGNOSIS — I25110 Atherosclerotic heart disease of native coronary artery with unstable angina pectoris: Secondary | ICD-10-CM | POA: Diagnosis present

## 2021-10-31 DIAGNOSIS — I5032 Chronic diastolic (congestive) heart failure: Secondary | ICD-10-CM | POA: Diagnosis present

## 2021-10-31 DIAGNOSIS — D6869 Other thrombophilia: Secondary | ICD-10-CM | POA: Diagnosis not present

## 2021-10-31 DIAGNOSIS — J159 Unspecified bacterial pneumonia: Secondary | ICD-10-CM | POA: Diagnosis present

## 2021-10-31 DIAGNOSIS — M109 Gout, unspecified: Secondary | ICD-10-CM | POA: Diagnosis present

## 2021-10-31 DIAGNOSIS — I130 Hypertensive heart and chronic kidney disease with heart failure and stage 1 through stage 4 chronic kidney disease, or unspecified chronic kidney disease: Secondary | ICD-10-CM | POA: Diagnosis present

## 2021-10-31 DIAGNOSIS — D6859 Other primary thrombophilia: Secondary | ICD-10-CM | POA: Diagnosis present

## 2021-10-31 DIAGNOSIS — Z886 Allergy status to analgesic agent status: Secondary | ICD-10-CM | POA: Diagnosis not present

## 2021-10-31 DIAGNOSIS — E039 Hypothyroidism, unspecified: Secondary | ICD-10-CM | POA: Diagnosis present

## 2021-10-31 DIAGNOSIS — U071 COVID-19: Principal | ICD-10-CM | POA: Diagnosis present

## 2021-10-31 DIAGNOSIS — I480 Paroxysmal atrial fibrillation: Secondary | ICD-10-CM | POA: Diagnosis present

## 2021-10-31 DIAGNOSIS — Z7901 Long term (current) use of anticoagulants: Secondary | ICD-10-CM

## 2021-10-31 DIAGNOSIS — R0602 Shortness of breath: Secondary | ICD-10-CM | POA: Diagnosis not present

## 2021-10-31 DIAGNOSIS — Z79899 Other long term (current) drug therapy: Secondary | ICD-10-CM

## 2021-10-31 DIAGNOSIS — I700 Atherosclerosis of aorta: Secondary | ICD-10-CM

## 2021-10-31 DIAGNOSIS — K219 Gastro-esophageal reflux disease without esophagitis: Secondary | ICD-10-CM | POA: Diagnosis present

## 2021-10-31 DIAGNOSIS — Z8249 Family history of ischemic heart disease and other diseases of the circulatory system: Secondary | ICD-10-CM

## 2021-10-31 DIAGNOSIS — E785 Hyperlipidemia, unspecified: Secondary | ICD-10-CM | POA: Diagnosis present

## 2021-10-31 DIAGNOSIS — I739 Peripheral vascular disease, unspecified: Secondary | ICD-10-CM | POA: Diagnosis present

## 2021-10-31 DIAGNOSIS — I2510 Atherosclerotic heart disease of native coronary artery without angina pectoris: Secondary | ICD-10-CM | POA: Diagnosis present

## 2021-10-31 DIAGNOSIS — Z885 Allergy status to narcotic agent status: Secondary | ICD-10-CM

## 2021-10-31 DIAGNOSIS — Z96643 Presence of artificial hip joint, bilateral: Secondary | ICD-10-CM | POA: Diagnosis present

## 2021-10-31 DIAGNOSIS — N183 Chronic kidney disease, stage 3 unspecified: Secondary | ICD-10-CM | POA: Diagnosis present

## 2021-10-31 DIAGNOSIS — R9431 Abnormal electrocardiogram [ECG] [EKG]: Secondary | ICD-10-CM | POA: Diagnosis not present

## 2021-10-31 HISTORY — DX: COVID-19: U07.1

## 2021-10-31 LAB — BASIC METABOLIC PANEL
Anion gap: 11 (ref 5–15)
BUN: 19 mg/dL (ref 8–23)
CO2: 24 mmol/L (ref 22–32)
Calcium: 8.7 mg/dL — ABNORMAL LOW (ref 8.9–10.3)
Chloride: 105 mmol/L (ref 98–111)
Creatinine, Ser: 1.32 mg/dL — ABNORMAL HIGH (ref 0.61–1.24)
GFR, Estimated: 55 mL/min — ABNORMAL LOW (ref >60)
Glucose, Bld: 115 mg/dL — ABNORMAL HIGH (ref 70–99)
Potassium: 3.6 mmol/L (ref 3.5–5.1)
Sodium: 140 mmol/L (ref 135–145)

## 2021-10-31 LAB — CBC WITH DIFFERENTIAL/PLATELET
Abs Immature Granulocytes: 0.07 10*3/uL (ref 0.00–0.07)
Basophils Absolute: 0 10*3/uL (ref 0.0–0.1)
Basophils Relative: 0 %
Eosinophils Absolute: 0 10*3/uL (ref 0.0–0.5)
Eosinophils Relative: 0 %
HCT: 46.2 % (ref 39.0–52.0)
Hemoglobin: 15.2 g/dL (ref 13.0–17.0)
Immature Granulocytes: 1 %
Lymphocytes Relative: 11 %
Lymphs Abs: 1.6 10*3/uL (ref 0.7–4.0)
MCH: 31.9 pg (ref 26.0–34.0)
MCHC: 32.9 g/dL (ref 30.0–36.0)
MCV: 96.9 fL (ref 80.0–100.0)
Monocytes Absolute: 0.9 10*3/uL (ref 0.1–1.0)
Monocytes Relative: 6 %
Neutro Abs: 11.7 10*3/uL — ABNORMAL HIGH (ref 1.7–7.7)
Neutrophils Relative %: 82 %
Platelets: 263 10*3/uL (ref 150–400)
RBC: 4.77 MIL/uL (ref 4.22–5.81)
RDW: 12.9 % (ref 11.5–15.5)
WBC: 14.3 10*3/uL — ABNORMAL HIGH (ref 4.0–10.5)
nRBC: 0 % (ref 0.0–0.2)

## 2021-10-31 LAB — URINALYSIS, ROUTINE W REFLEX MICROSCOPIC
Bilirubin Urine: NEGATIVE
Glucose, UA: >1000 mg/dL — AB
Hgb urine dipstick: NEGATIVE
Ketones, ur: 15 mg/dL — AB
Leukocytes,Ua: NEGATIVE
Nitrite: NEGATIVE
Specific Gravity, Urine: 1.045 — ABNORMAL HIGH (ref 1.005–1.030)
pH: 7 (ref 5.0–8.0)

## 2021-10-31 LAB — RESP PANEL BY RT-PCR (FLU A&B, COVID) ARPGX2
Influenza A by PCR: NEGATIVE
Influenza B by PCR: NEGATIVE
SARS Coronavirus 2 by RT PCR: POSITIVE — AB

## 2021-10-31 LAB — D-DIMER, QUANTITATIVE: D-Dimer, Quant: 1.9 ug/mL-FEU — ABNORMAL HIGH (ref 0.00–0.50)

## 2021-10-31 MED ORDER — LACTATED RINGERS IV BOLUS
1000.0000 mL | Freq: Once | INTRAVENOUS | Status: AC
  Administered 2021-10-31: 1000 mL via INTRAVENOUS

## 2021-10-31 MED ORDER — IPRATROPIUM-ALBUTEROL 0.5-2.5 (3) MG/3ML IN SOLN
3.0000 mL | Freq: Once | RESPIRATORY_TRACT | Status: AC
  Administered 2021-10-31: 3 mL via RESPIRATORY_TRACT
  Filled 2021-10-31: qty 3

## 2021-10-31 MED ORDER — IOHEXOL 350 MG/ML SOLN
100.0000 mL | Freq: Once | INTRAVENOUS | Status: AC | PRN
  Administered 2021-10-31: 80 mL via INTRAVENOUS

## 2021-10-31 MED ORDER — ONDANSETRON HCL 4 MG/2ML IJ SOLN
4.0000 mg | Freq: Once | INTRAMUSCULAR | Status: AC
  Administered 2021-10-31: 4 mg via INTRAVENOUS
  Filled 2021-10-31: qty 2

## 2021-10-31 MED ORDER — SODIUM CHLORIDE 0.9 % IV SOLN
500.0000 mg | Freq: Once | INTRAVENOUS | Status: AC
  Administered 2021-10-31: 500 mg via INTRAVENOUS
  Filled 2021-10-31: qty 500

## 2021-10-31 MED ORDER — SODIUM CHLORIDE 0.9 % IV SOLN
1.0000 g | Freq: Once | INTRAVENOUS | Status: AC
  Administered 2021-10-31: 1 g via INTRAVENOUS
  Filled 2021-10-31: qty 10

## 2021-10-31 NOTE — ED Notes (Signed)
Patient transported to CT 

## 2021-10-31 NOTE — ED Notes (Signed)
Called [facility] to transport patient to [place] room 36

## 2021-10-31 NOTE — ED Provider Notes (Signed)
[facility] EMERGENCY DEPARTMENT Provider Note  CSN: [id number] Arrival date & time: 10/31/21 1543    History Chief Complaint  Patient presents with  . Generalized Body Aches    Ryan [name] is a 80 y.o. male with multiple medical problems was diagnosed with Covid by home swab around Nov 1, began Paxlovid which he finished around Nov 5. He was still feeling poorly so he came to the ED on Nov 7 where his workup was largely unremarkable. He has continued to feel bad with diffuse body aches, worse in R upper back, dry cough, SOB, ringing in his R ear. He has been nauseated with some dry heaves but no vomiting. No chest pains or fever at home. He went to vascular surgery 2 days ago who told him his ear ringing was not due to his known carotid disease. He has not been back to see his PCP.    Past Medical History:  Diagnosis Date  . Arthritis   . CAD (coronary artery disease)   . Carotid artery occlusion   . Cataract    Bil eyes/worse in left eye  . CHF (congestive heart failure) ([place])   . Chronic back pain   . DVT (deep venous thrombosis) ([place])   . Dysrhythmia   . Enlarged prostate    takes Rapaflo daily  . GERD (gastroesophageal reflux disease)    occasional  . History of colon polyps   . History of gout    has colchicine prn  . History of kidney stones   . Hyperlipidemia    takes Crestor daily  . Hypertension    takes Amlodipine daily  . Hypothyroidism   . Myocardial infarction ([place])   . Peripheral vascular disease ([place])   . Pulmonary emboli ([place]) 03/20/2015   elevated d-dimer, intermediate V/Q study, atypical chest pain and SOB. Start on Xarelto 20mg  BID for 3 month  . Rapid atrial fibrillation ([place])   . Renal insufficiency   . Shortness of breath dyspnea   . Urinary frequency   . Urinary urgency     Past Surgical History:  Procedure Laterality Date  . APPENDECTOMY    . BACK SURGERY     5 times  . big toe surgery    . CARDIAC CATHETERIZATION      2010    dr Acie Fredrickson  . cataract surgery     left eye  . CHOLECYSTECTOMY N/A 07/27/2016   Procedure: LAPAROSCOPIC CHOLECYSTECTOMY;  Surgeon: Mickeal Skinner, MD;  Location: [facility];  Service: General;  Laterality: N/A;  . COLONOSCOPY    . CORONARY ARTERY BYPASS GRAFT N/A 04/05/2015   Procedure: CORONARY ARTERY BYPASS GRAFTING (CABG)X4 LIMA-LAD; SVG-DIAG1-DIAG2; SVG-PD;  Surgeon: [name], MD;  Location: [facility];  Service: Open Heart Surgery;  Laterality: N/A;  . CORONARY/GRAFT ANGIOGRAPHY N/A 04/22/2018   Procedure: CORONARY/GRAFT ANGIOGRAPHY;  Surgeon: Martinique, Peter M, MD;  Location: [facility] CV LAB;  Service: Cardiovascular;  Laterality: N/A;  . CYSTOSCOPY    . ENDARTERECTOMY Left 04/24/2016   Procedure: ENDARTERECTOMY LEFT CAROTID;  Surgeon: Mal Misty, MD;  Location: [facility];  Service: Vascular;  Laterality: Left;  . EYE SURGERY    . FEMORAL ARTERY - POPLITEAL ARTERY BYPASS GRAFT    . ICD IMPLANT N/A 12/30/2020   Procedure: ICD IMPLANT;  Surgeon: Evans Lance, MD;  Location: [facility];  Service: Cardiovascular;  Laterality: N/A;  . JOINT REPLACEMENT     shoulder  . LEFT HEART CATHETERIZATION  WITH CORONARY ANGIOGRAM N/A 04/03/2015   Procedure: LEFT HEART CATHETERIZATION WITH CORONARY ANGIOGRAM;  Surgeon: Troy Sine, MD;  Location: [facility] CATH LAB;  Service: Cardiovascular;  Laterality: N/A;  . LUMBAR LAMINECTOMY  01/06/2013   Procedure: MICRODISCECTOMY LUMBAR LAMINECTOMY;  Surgeon: Marybelle Killings, MD;  Location: [facility];  Service: Orthopedics;  Laterality: N/A;  L3-4 decompression  . LUMBAR LAMINECTOMY/DECOMPRESSION MICRODISCECTOMY  02/12/2012   Procedure: LUMBAR LAMINECTOMY/DECOMPRESSION MICRODISCECTOMY;  Surgeon: Floyce Stakes, MD;  Location: [facility];  Service: Neurosurgery;  Laterality: N/A;  Lumbar four-five laminectomy  . PATCH ANGIOPLASTY Left 04/24/2016   Procedure: LEFT CAROTID ARTERY PATCH ANGIOPLASTY;  Surgeon: Mal Misty, MD;  Location: [facility];   Service: Vascular;  Laterality: Left;  . RIGHT HEART CATH AND CORONARY/GRAFT ANGIOGRAPHY N/A 01/30/2019   Procedure: RIGHT HEART CATH AND CORONARY/GRAFT ANGIOGRAPHY;  Surgeon: Troy Sine, MD;  Location: [facility];  Service: Cardiovascular;  Laterality: N/A;  . STERIOD INJECTION Right 01/09/2014   Procedure: STEROID INJECTION;  Surgeon: Mcarthur Rossetti, MD;  Location: [facility];  Service: Orthopedics;  Laterality: Right;  . TEE WITHOUT CARDIOVERSION N/A 04/05/2015   Procedure: TRANSESOPHAGEAL ECHOCARDIOGRAM (TEE);  Surgeon: [name], MD;  Location: [facility];  Service: Open Heart Surgery;  Laterality: N/A;  . TOTAL HIP ARTHROPLASTY Left 01/09/2014   DR Ninfa Linden  . TOTAL HIP ARTHROPLASTY Left 01/09/2014   Procedure: LEFT TOTAL HIP ARTHROPLASTY ANTERIOR APPROACH and Steroid Injection Right hip;  Surgeon: Mcarthur Rossetti, MD;  Location: [facility];  Service: Orthopedics;  Laterality: Left;  . TOTAL HIP ARTHROPLASTY Right 08/15/2019  . TOTAL HIP ARTHROPLASTY Right 08/15/2019   Procedure: RIGHT TOTAL HIP ARTHROPLASTY ANTERIOR APPROACH;  Surgeon: Mcarthur Rossetti, MD;  Location: [facility];  Service: Orthopedics;  Laterality: Right;    Family History  Problem Relation Age of Onset  . Heart disease Father   . Heart attack Father   . Heart disease Sister   . Hypertension Sister   . Heart attack Sister   . Hypertension Mother   . Diabetes Son     Social History   Tobacco Use  . Smoking status: Former    Types: Cigarettes    Quit date: 02/04/1987    Years since quitting: 34.7  . Smokeless tobacco: Former    Types: Chew    Quit date: 07/20/2009  . Tobacco comments:    quit 35+yrs ago  Vaping Use  . Vaping Use: Never used  Substance Use Topics  . Alcohol use: No    Alcohol/week: 0.0 standard drinks  . Drug use: No     Home Medications Prior to Admission medications   Medication Sig Start Date End Date Taking? Authorizing Provider  allopurinol (ZYLOPRIM) 100  MG tablet Take 100 mg by mouth daily as needed (gout). 05/29/20   [provider]  ALPRAZolam Duanne Moron) 0.5 MG tablet Take 0.5 mg by mouth 3 (three) times daily as needed. 10/07/21   [provider]  amiodarone (PACERONE) 200 MG tablet Take 1 tablet (200 mg total) by mouth daily. 09/11/21   Mercy Riding, MD  apixaban (ELIQUIS) 2.5 MG TABS tablet Take 1 tablet (2.5 mg total) by mouth 2 (two) times daily. 10/16/21 04/14/22  Loel Dubonnet, NP  carvedilol (COREG) 3.125 MG tablet Take 1 tablet (3.125 mg total) by mouth 2 (two) times daily with a meal. 10/16/21 04/14/22  Loel Dubonnet, NP  finasteride (PROSCAR) 5 MG tablet Take 5 mg by mouth  daily. 08/18/21   [provider]  furosemide (LASIX) 20 MG tablet Take 1 tablet (20 mg total) by mouth daily. 10/16/21 04/14/22  Loel Dubonnet, NP  levothyroxine (SYNTHROID) 25 MCG tablet Take 25 mcg by mouth daily. 07/02/21   [provider]  nitroGLYCERIN (NITROSTAT) 0.4 MG SL tablet Place 1 tablet (0.4 mg total) under the tongue every 5 (five) minutes as needed for chest pain. 07/09/21   Evans Lance, MD  ondansetron (ZOFRAN) 4 MG tablet Take 4 mg by mouth every 8 (eight) hours as needed for nausea/vomiting. 09/09/21   [provider]  pantoprazole (PROTONIX) 40 MG tablet Take 40 mg by mouth daily. 09/30/21   [provider]  REPATHA SURECLICK 578 MG/ML SOAJ INJECT 1 PEN INTO THE SKIN EVERY 14 DAYS. 09/16/21   [name], [name], MD  spironolactone (ALDACTONE) 25 MG tablet Take 0.5 tablets (12.5 mg total) by mouth daily. 10/16/21 10/11/22  Loel Dubonnet, NP  tamsulosin (FLOMAX) 0.4 MG CAPS capsule Take 0.4 mg by mouth at bedtime. 03/01/17   [provider]     Allergies    Zetia [ezetimibe], Codeine, and Unithroid [levothyroxine sodium]   Review of Systems   Review of Systems A comprehensive review of systems was completed and negative except as noted in HPI.    Physical Exam BP (!)  189/93 (BP Location: Left Arm) Comment: Left Arm Lying  Pulse 86   Temp 97.9 F (36.6 C) (Oral)   Resp (!) 28   SpO2 99%   Physical Exam Vitals and nursing note reviewed.  Constitutional:      Appearance: Normal appearance.  HENT:     Head: Normocephalic and atraumatic.     Ears:     Comments: L TM is normal, R TM without bulging or erythema, small circular area just inferior to umbo, no perforation or signs of infection    Nose: Nose normal.     Mouth/Throat:     Mouth: Mucous membranes are moist.  Eyes:     Extraocular Movements: Extraocular movements intact.     Conjunctiva/sclera: Conjunctivae normal.  Cardiovascular:     Rate and Rhythm: Normal rate.  Pulmonary:     Effort: Pulmonary effort is normal.     Breath sounds: Normal breath sounds. No wheezing, rhonchi or rales.  Abdominal:     General: Abdomen is flat.     Palpations: Abdomen is soft.     Tenderness: There is no abdominal tenderness.  Musculoskeletal:        General: No swelling. Normal range of motion.     Cervical back: Neck supple.  Skin:    General: Skin is warm and dry.  Neurological:     General: No focal deficit present.     Mental Status: He is alert.  Psychiatric:        Mood and Affect: Mood normal.     ED Results / Procedures / Treatments   Labs (all labs ordered are listed, but only abnormal results are displayed) Labs Reviewed  RESP PANEL BY RT-PCR (FLU A&B, COVID) ARPGX2  BASIC METABOLIC PANEL  CBC WITH DIFFERENTIAL/PLATELET    EKG None   Radiology No results found.  Procedures Procedures  Medications Ordered in the ED Medications  lactated ringers bolus 1,000 mL (has no administration in time range)  ondansetron (ZOFRAN) injection 4 mg (has no administration in time range)     MDM Rules/Calculators/A&P MDM Patient with continued Covid symptoms now 10 days from diagnosis  and 5 days s/p Paxlovid. He has not discussed his symptoms with his primary doctor except when he  called complaining of a continued cough and had some medications called in. He does not know the name of it. His exam is largely benign here. He has elevated BP, has not missed any doses of meds. Will recheck labs and CXR today. Consider PE as the cause of his thoracic back pain although he is on Eliquis.   ED Course  I have reviewed the triage vital signs and the nursing notes.  Pertinent labs & imaging results that were available during my care of the patient were reviewed by me and considered in my medical decision making (see chart for details).  Clinical Course as of 10/31/21 2320  Fri Oct 31, 2021  1652 CBC with mild leukocytosis, worsened from previous ED visit this week.  [CS]  1219 Dimer is mildly elevated, even with age adjustment. Awaiting BMP to determine ability to do CTA.  [CS]  7588 BMP shows Cr improved from baseline, will check CTA chest.  [CS]  1745 BP improved without intervention.  [CS]  1819 CTA images and results reviewed. No PE but there is a RLL pneumonia which may account for his R back pain. Will give Abx, plan admission given PSI of 100.  [CS]  1923 Spoke with Dr. Rowe Pavy, Hospitalist, who will accept for admission. Patient reports some difficulty urinating, unable to go into a urinal, states he had to have cath last time he was hospitalized. Bladder scan with ~300cc. He in-and-out caths at home and is requesting a foley catheter while admitted.  [CS]    Clinical Course User Index [CS] Truddie Hidden, MD    Final Clinical Impression(s) / ED Diagnoses Final diagnoses:  None    Rx / DC Orders ED Discharge Orders     None        Truddie Hidden, MD 10/31/21 2320

## 2021-10-31 NOTE — ED Notes (Signed)
Pt bladder scanned. Highest amount scanned was 306 ml. Informed Zenia Resides - RN.

## 2021-10-31 NOTE — ED Notes (Signed)
States it is his Rt shoulder that hurts him the most and this is what has brought him to the ED

## 2021-10-31 NOTE — ED Triage Notes (Signed)
States was recently dx with COVID, presents today with body aches and nausea. Onset this past Tuesday.

## 2021-10-31 NOTE — ED Notes (Signed)
Patient attempt to urinate unsuccessful.  States feels like he needs to void.  Bladder scan done.

## 2021-11-01 ENCOUNTER — Encounter (HOSPITAL_COMMUNITY): Payer: Self-pay | Admitting: Family Medicine

## 2021-11-01 ENCOUNTER — Other Ambulatory Visit: Payer: Self-pay

## 2021-11-01 DIAGNOSIS — J189 Pneumonia, unspecified organism: Secondary | ICD-10-CM | POA: Diagnosis present

## 2021-11-01 DIAGNOSIS — I700 Atherosclerosis of aorta: Secondary | ICD-10-CM

## 2021-11-01 DIAGNOSIS — U071 COVID-19: Secondary | ICD-10-CM | POA: Diagnosis not present

## 2021-11-01 DIAGNOSIS — D6869 Other thrombophilia: Secondary | ICD-10-CM | POA: Diagnosis not present

## 2021-11-01 DIAGNOSIS — R9431 Abnormal electrocardiogram [ECG] [EKG]: Secondary | ICD-10-CM | POA: Diagnosis present

## 2021-11-01 DIAGNOSIS — I5032 Chronic diastolic (congestive) heart failure: Secondary | ICD-10-CM | POA: Diagnosis not present

## 2021-11-01 HISTORY — DX: Other thrombophilia: D68.69

## 2021-11-01 HISTORY — DX: Abnormal electrocardiogram (ECG) (EKG): R94.31

## 2021-11-01 LAB — BASIC METABOLIC PANEL
Anion gap: 9 (ref 5–15)
BUN: 13 mg/dL (ref 8–23)
CO2: 25 mmol/L (ref 22–32)
Calcium: 8.1 mg/dL — ABNORMAL LOW (ref 8.9–10.3)
Chloride: 104 mmol/L (ref 98–111)
Creatinine, Ser: 1.17 mg/dL (ref 0.61–1.24)
GFR, Estimated: >60 mL/min (ref >60)
Glucose, Bld: 101 mg/dL — ABNORMAL HIGH (ref 70–99)
Potassium: 4.2 mmol/L (ref 3.5–5.1)
Sodium: 138 mmol/L (ref 135–145)

## 2021-11-01 LAB — CBC
HCT: 43.6 % (ref 39.0–52.0)
Hemoglobin: 14.5 g/dL (ref 13.0–17.0)
MCH: 32.4 pg (ref 26.0–34.0)
MCHC: 33.3 g/dL (ref 30.0–36.0)
MCV: 97.5 fL (ref 80.0–100.0)
Platelets: 265 10*3/uL (ref 150–400)
RBC: 4.47 MIL/uL (ref 4.22–5.81)
RDW: 12.9 % (ref 11.5–15.5)
WBC: 13.7 10*3/uL — ABNORMAL HIGH (ref 4.0–10.5)
nRBC: 0 % (ref 0.0–0.2)

## 2021-11-01 LAB — MAGNESIUM: Magnesium: 1.8 mg/dL (ref 1.7–2.4)

## 2021-11-01 LAB — STREP PNEUMONIAE URINARY ANTIGEN: Strep Pneumo Urinary Antigen: NEGATIVE

## 2021-11-01 LAB — PROCALCITONIN: Procalcitonin: <0.1 ng/mL

## 2021-11-01 MED ORDER — ACETAMINOPHEN 650 MG RE SUPP: 650.0000 mg | Freq: Four times a day (QID) | RECTAL | Status: DC | PRN

## 2021-11-01 MED ORDER — KETOROLAC TROMETHAMINE 15 MG/ML IJ SOLN
7.5000 mg | Freq: Once | INTRAMUSCULAR | Status: AC
Start: 2021-11-01 — End: 2021-11-01
  Administered 2021-11-01: 7.5 mg via INTRAVENOUS
  Filled 2021-11-01: qty 1

## 2021-11-01 MED ORDER — APIXABAN 2.5 MG PO TABS
2.5000 mg | ORAL_TABLET | Freq: Two times a day (BID) | ORAL | Status: DC
  Administered 2021-11-01 – 2021-11-02 (×4): 2.5 mg via ORAL
  Filled 2021-11-01 (×4): qty 1

## 2021-11-01 MED ORDER — ENSURE ENLIVE PO LIQD
237.0000 mL | Freq: Two times a day (BID) | ORAL | Status: DC
  Administered 2021-11-01 – 2021-11-02 (×3): 237 mL via ORAL

## 2021-11-01 MED ORDER — POTASSIUM CHLORIDE CRYS ER 20 MEQ PO TBCR
20.0000 meq | EXTENDED_RELEASE_TABLET | Freq: Once | ORAL | Status: AC
  Administered 2021-11-01: 20 meq via ORAL
  Filled 2021-11-01: qty 1

## 2021-11-01 MED ORDER — ALPRAZOLAM 0.5 MG PO TABS
0.5000 mg | ORAL_TABLET | Freq: Three times a day (TID) | ORAL | Status: DC | PRN
  Administered 2021-11-01: 0.5 mg via ORAL
  Filled 2021-11-01: qty 1

## 2021-11-01 MED ORDER — SENNOSIDES-DOCUSATE SODIUM 8.6-50 MG PO TABS
1.0000 | ORAL_TABLET | Freq: Every evening | ORAL | Status: DC | PRN
  Filled 2021-11-01: qty 1

## 2021-11-01 MED ORDER — LEVOTHYROXINE SODIUM 25 MCG PO TABS
25.0000 ug | ORAL_TABLET | Freq: Every day | ORAL | Status: DC
  Administered 2021-11-01 – 2021-11-02 (×2): 25 ug via ORAL
  Filled 2021-11-01 (×3): qty 1

## 2021-11-01 MED ORDER — ZOLPIDEM TARTRATE 5 MG PO TABS: 5.0000 mg | ORAL_TABLET | Freq: Every evening | ORAL | Status: DC | PRN

## 2021-11-01 MED ORDER — MAGNESIUM SULFATE IN D5W 1-5 GM/100ML-% IV SOLN
1.0000 g | Freq: Once | INTRAVENOUS | Status: AC
  Administered 2021-11-01: 1 g via INTRAVENOUS
  Filled 2021-11-01: qty 100

## 2021-11-01 MED ORDER — SODIUM CHLORIDE 0.9 % IV SOLN
2.0000 g | INTRAVENOUS | Status: DC
  Administered 2021-11-01: 2 g via INTRAVENOUS
  Filled 2021-11-01: qty 20

## 2021-11-01 MED ORDER — TAMSULOSIN HCL 0.4 MG PO CAPS
0.4000 mg | ORAL_CAPSULE | Freq: Every day | ORAL | Status: DC
  Administered 2021-11-01 (×2): 0.4 mg via ORAL
  Filled 2021-11-01 (×2): qty 1

## 2021-11-01 MED ORDER — GUAIFENESIN 100 MG/5ML PO LIQD: 5.0000 mL | ORAL | Status: DC | PRN

## 2021-11-01 MED ORDER — SODIUM CHLORIDE 0.9 % IV SOLN: 500.0000 mg | INTRAVENOUS | Status: DC

## 2021-11-01 MED ORDER — ALLOPURINOL 100 MG PO TABS: 100.0000 mg | ORAL_TABLET | Freq: Every day | ORAL | Status: DC | PRN

## 2021-11-01 MED ORDER — ADULT MULTIVITAMIN W/MINERALS CH
1.0000 | ORAL_TABLET | Freq: Every day | ORAL | Status: DC
  Administered 2021-11-01 – 2021-11-02 (×2): 1 via ORAL
  Filled 2021-11-01 (×2): qty 1

## 2021-11-01 MED ORDER — HYDROCODONE-ACETAMINOPHEN 5-325 MG PO TABS
1.0000 | ORAL_TABLET | Freq: Four times a day (QID) | ORAL | Status: DC | PRN
  Administered 2021-11-01: 1 via ORAL
  Filled 2021-11-01: qty 1

## 2021-11-01 MED ORDER — FINASTERIDE 5 MG PO TABS
5.0000 mg | ORAL_TABLET | Freq: Every day | ORAL | Status: DC
  Administered 2021-11-01 – 2021-11-02 (×2): 5 mg via ORAL
  Filled 2021-11-01 (×2): qty 1

## 2021-11-01 MED ORDER — CHLORHEXIDINE GLUCONATE CLOTH 2 % EX PADS
6.0000 | MEDICATED_PAD | Freq: Every day | CUTANEOUS | Status: DC
  Administered 2021-11-01 – 2021-11-02 (×2): 6 via TOPICAL

## 2021-11-01 MED ORDER — ACETAMINOPHEN 325 MG PO TABS
650.0000 mg | ORAL_TABLET | Freq: Four times a day (QID) | ORAL | Status: DC | PRN
  Administered 2021-11-01: 650 mg via ORAL
  Filled 2021-11-01: qty 2

## 2021-11-01 MED ORDER — PANTOPRAZOLE SODIUM 40 MG PO TBEC
40.0000 mg | DELAYED_RELEASE_TABLET | Freq: Every day | ORAL | Status: DC
  Administered 2021-11-01 – 2021-11-02 (×2): 40 mg via ORAL
  Filled 2021-11-01 (×2): qty 1

## 2021-11-01 NOTE — Assessment & Plan Note (Signed)
--   Secondary to atrial fibrillation, continue apixaban

## 2021-11-01 NOTE — Assessment & Plan Note (Addendum)
--   Stable today on EKG. felt to be stable.  Discussed with Dr. Domenic Polite, recommends continuing current medications including amiodarone without change.

## 2021-11-01 NOTE — Assessment & Plan Note (Addendum)
--   No hypoxia.  Responded well to antibiotics, transition to oral antibiotics and continue as an outpatient.

## 2021-11-01 NOTE — Hospital Course (Addendum)
80 year old man PMH including atrial fibrillation on apixaban presented with cough and shortness of breath.  Recent diagnosis COVID-19 treated with Paxil elevated. --11/11 admitted for pneumonia --11/12 responded well and antibiotics, cleared for discharge

## 2021-11-01 NOTE — Assessment & Plan Note (Signed)
--   Appears stable, continue apixaban.  Could consider statin as an outpatient.

## 2021-11-01 NOTE — Progress Notes (Signed)
  Progress Note Ryan [name]   QVZ:[id number]  DOB: [birth year]  DOA: 10/31/2021     1 Date of Service: 11/01/2021   Clinical Course 80 year old man PMH including atrial fibrillation on apixaban presented with cough and shortness of breath.  Recent diagnosis COVID-19 treated with Paxil elevated. --11/11 admitted for pneumonia --11/12 better  Assessment and Plan * Right lower lobe pneumonia -- Feels much better today, no hypoxia, breathing better -- Continue antibiotics, likely home tomorrow  COVID-19 -- Per chart diagnosed at home around November 1.  Completed baclofen at home.  CT did not show typical findings, no hypoxia, no indication for treatment. -- Supportive care  Stage 3a chronic kidney disease (CKD) (HCC) -- Appears stable  PAF (paroxysmal atrial fibrillation) ([facility]) -- Stable.  Continue apixaban, amiodarone, carvedilol  Chronic diastolic CHF (congestive heart failure) ([facility]) -- Appears stable, resume Lasix and spironolactone on discharge  Acquired thrombophilia ([place]) -- Secondary to atrial fibrillation, continue apixaban  PAD (peripheral artery disease) ([facility]) -- Appears stable, continue apixaban.  Could consider statin as an outpatient.  Aortic atherosclerosis ([facility]CC) --consider statin as outpatient  Prolonged QT interval -- Stable today on EKG.  Repeat EKG in AM.  Potassium at goal.  Will give magnesium.  Review medications Subjective:  Feels much better, breathing better  Objective Vitals:   11/01/21 0610 11/01/21 0759 11/01/21 1228 11/01/21 1624  BP:  102/61 (!) 101/56 124/70  Pulse: 75 70 62 75  Resp: (!) 22 17 17 14   Temp:  97.7 F (36.5 C) (!) 97.3 F (36.3 C) (!) 97.5 F (36.4 C)  TempSrc:  Oral Oral Oral  SpO2: 96% 97% 95% 98%  Weight:      Height:       66.7 kg  Vital signs were reviewed and unremarkable.  Exam Physical Exam Constitutional:      General: He is not in acute distress.    Appearance: He is not ill-appearing or  toxic-appearing.  Cardiovascular:     Rate and Rhythm: Normal rate and regular rhythm.     Heart sounds: No murmur heard. Pulmonary:     Effort: Pulmonary effort is normal. No respiratory distress.     Breath sounds: No wheezing, rhonchi or rales.  Neurological:     Mental Status: He is alert.  Psychiatric:        Mood and Affect: Mood normal.        Behavior: Behavior normal.    Labs / Other Information My review of labs, imaging, notes and other tests is significant for basic metabolic panel unremarkable, magnesium 1.8   WBC down to 13.7  Disposition Plan: Status is: Inpatient  Remains inpatient appropriate because: IV antibiotics for pneumonia   Time spent: 25 minutes [facility] 11/01/2021, 6:31 PM

## 2021-11-01 NOTE — Assessment & Plan Note (Signed)
--   Appears stable, resume Lasix and spironolactone on discharge

## 2021-11-01 NOTE — Progress Notes (Signed)
Initial Nutrition Assessment  DOCUMENTATION CODES:   Not applicable  INTERVENTION:   -Continue Ensure Enlive po BID, each supplement provides 350 kcal and 20 grams of protein  -MVI with minerals daily -Liberalize diet to regular  NUTRITION DIAGNOSIS:   Increased nutrient needs related to acute illness (COVID-19) as evidenced by estimated needs.  GOAL:   Patient will meet greater than or equal to 90% of their needs  MONITOR:   PO intake, Supplement acceptance, Labs, Weight trends, Skin, I & O's  REASON FOR ASSESSMENT:   Malnutrition Screening Tool    ASSESSMENT:   Ryan [name] is a pleasant 80 y.o. male with medical history significant for atrial fibrillation on Eliquis, chronic diastolic CHF, CAD, PAD, and chronic kidney disease stage IIIa, now presenting to the emergency department for evaluation of worsening cough and shortness of breath.  The patient reports that he was recently diagnosed with COVID-19, completed a course of Paxlovid, had some improvement initially, but has since began to worsen again with worsening shortness of breath, productive cough, and fatigue.  He denies any lower extremity swelling or tenderness and denies any chest pain.  Pt admitted with bacterial pneumonia and COVID-19.   Reviewed I/O's:-250 ml x 24 hours  UOP: 700 ml x 24 hours   Pt unavailable at time of visit. Attempted to speak with pt via call to hospital room phone, however, unable to reach. RD unable to obtain further nutrition-related history or complete nutrition-focused physical exam at this time.    Pt currently on a heart healthy diet, but no meal completion data available to assess at this time.   Reviewed wt hx; pt has experienced a 3% wt loss over the past 6 months, which is not significant for time frame.   Pt with increased nutritional needs for COVID-19 and would benefit from addition of oral nutrition supplements. He is consuming Ensure Enlive supplements per MAR.    Medications reviewed.   Labs reviewed.   Diet Order:   Diet Order             Diet Heart Room service appropriate? Yes; Fluid consistency: Thin  Diet effective now                   EDUCATION NEEDS:   No education needs have been identified at this time  Skin:  Skin Assessment: Reviewed RN Assessment  Last BM:  11/01/21  Height:   Ht Readings from Last 1 Encounters:  10/31/21 5\' 6"  (1.676 m)    Weight:   Wt Readings from Last 1 Encounters:  10/31/21 66.7 kg    Ideal Body Weight:  64.5 kg  BMI:  Body mass index is 23.73 kg/m.  Estimated Nutritional Needs:   Kcal:  1800-2000  Protein:  85-100 grams  Fluid:  > 1.8 L    Ryan [name], RD, LDN, [place] Registered Dietitian II Certified Diabetes Care and Education Specialist Please refer to [facility] for RD and/or RD on-call/weekend/after hours pager

## 2021-11-01 NOTE — Assessment & Plan Note (Signed)
--   Stable.  Continue apixaban, amiodarone, carvedilol

## 2021-11-01 NOTE — Assessment & Plan Note (Addendum)
--   Per chart diagnosed at home around November 1.  Completed Paxlovid at home.  CT did not show typical findings, no hypoxia, no indication for treatment. -- Supportive care

## 2021-11-01 NOTE — H&P (Addendum)
History and Physical    Ryan [name] CWU:[id number] DOB: 1941/11/22 DOA: 10/31/2021  PCP: Ginger Organ., MD   Patient coming from: Home   Chief Complaint: SOB, cough, fatigue   HPI: Ryan [name] is a pleasant 80 y.o. male with medical history significant for atrial fibrillation on Eliquis, chronic diastolic CHF, CAD, PAD, and chronic kidney disease stage IIIa, now presenting to the emergency department for evaluation of worsening cough and shortness of breath.  The patient reports that he was recently diagnosed with COVID-19, completed a course of Paxlovid, had some improvement initially, but has since began to worsen again with worsening shortness of breath, productive cough, and fatigue.  He denies any lower extremity swelling or tenderness and denies any chest pain.  [facility] ED Course: Upon arrival to the ED, patient is found to be afebrile, saturating mid 90s on room air, tachypneic in the upper 20s, and with stable blood pressure.  EKG features atrial flutter with QTc 551 ms.  Chemistry panel with creatinine 1.32.  CBC with leukocytosis to 14,300.  D-dimer elevated 1.90.  CTA chest negative for PE but concerning for right lower lobe pneumonia.  Blood cultures were collected in the ED and the patient was given Rocephin, azithromycin, DuoNeb, Zofran, and a liter of LR.  He was transferred to [facility] for admission.  Review of Systems:  All other systems reviewed and apart from HPI, are negative.  Past Medical History:  Diagnosis Date   Arthritis    CAD (coronary artery disease)    Carotid artery occlusion    Cataract    Bil eyes/worse in left eye   CHF (congestive heart failure) (HCC)    Chronic back pain    DVT (deep venous thrombosis) (HCC)    Dysrhythmia    Enlarged prostate    takes Rapaflo daily   GERD (gastroesophageal reflux disease)    occasional   History of colon polyps    History of gout    has colchicine prn   History of kidney stones     Hyperlipidemia    takes Crestor daily   Hypertension    takes Amlodipine daily   Hypothyroidism    Myocardial infarction [facility])    Peripheral vascular disease ([place])    Pulmonary emboli ([place]) 03/20/2015   elevated d-dimer, intermediate V/Q study, atypical chest pain and SOB. Start on Xarelto 20mg  BID for 3 month   Rapid atrial fibrillation ([place])    Renal insufficiency    Shortness of breath dyspnea    Urinary frequency    Urinary urgency     Past Surgical History:  Procedure Laterality Date   APPENDECTOMY     BACK SURGERY     5 times   big toe surgery     CARDIAC CATHETERIZATION     2010    dr Acie Fredrickson   cataract surgery     left eye   CHOLECYSTECTOMY N/A 07/27/2016   Procedure: LAPAROSCOPIC CHOLECYSTECTOMY;  Surgeon: Mickeal Skinner, MD;  Location: [place];  Service: General;  Laterality: N/A;   COLONOSCOPY     CORONARY ARTERY BYPASS GRAFT N/A 04/05/2015   Procedure: CORONARY ARTERY BYPASS GRAFTING (CABG)X4 LIMA-LAD; SVG-DIAG1-DIAG2; SVG-PD;  Surgeon: [name], MD;  Location: [place];  Service: Open Heart Surgery;  Laterality: N/A;   CORONARY/GRAFT ANGIOGRAPHY N/A 04/22/2018   Procedure: CORONARY/GRAFT ANGIOGRAPHY;  Surgeon: Martinique, Peter M, MD;  Location: [place] CV LAB;  Service: Cardiovascular;  Laterality: N/A;   CYSTOSCOPY  ENDARTERECTOMY Left 04/24/2016   Procedure: ENDARTERECTOMY LEFT CAROTID;  Surgeon: Mal Misty, MD;  Location: [place];  Service: Vascular;  Laterality: Left;   EYE SURGERY     FEMORAL ARTERY - POPLITEAL ARTERY BYPASS GRAFT     ICD IMPLANT N/A 12/30/2020   Procedure: ICD IMPLANT;  Surgeon: Evans Lance, MD;  Location: [place] CV LAB;  Service: Cardiovascular;  Laterality: N/A;   JOINT REPLACEMENT     shoulder   LEFT HEART CATHETERIZATION WITH CORONARY ANGIOGRAM N/A 04/03/2015   Procedure: LEFT HEART CATHETERIZATION WITH CORONARY ANGIOGRAM;  Surgeon: Troy Sine, MD;  Location: [facility] CATH LAB;  Service: Cardiovascular;  Laterality:  N/A;   LUMBAR LAMINECTOMY  01/06/2013   Procedure: MICRODISCECTOMY LUMBAR LAMINECTOMY;  Surgeon: Marybelle Killings, MD;  Location: [place];  Service: Orthopedics;  Laterality: N/A;  L3-4 decompression   LUMBAR LAMINECTOMY/DECOMPRESSION MICRODISCECTOMY  02/12/2012   Procedure: LUMBAR LAMINECTOMY/DECOMPRESSION MICRODISCECTOMY;  Surgeon: Floyce Stakes, MD;  Location: [place] [facility]O [facility];  Service: Neurosurgery;  Laterality: N/A;  Lumbar four-five laminectomy   PATCH ANGIOPLASTY Left 04/24/2016   Procedure: LEFT CAROTID ARTERY PATCH ANGIOPLASTY;  Surgeon: Mal Misty, MD;  Location: [place];  Service: Vascular;  Laterality: Left;   RIGHT HEART CATH AND CORONARY/GRAFT ANGIOGRAPHY N/A 01/30/2019   Procedure: RIGHT HEART CATH AND CORONARY/GRAFT ANGIOGRAPHY;  Surgeon: Troy Sine, MD;  Location: [place] CV LAB;  Service: Cardiovascular;  Laterality: N/A;   STERIOD INJECTION Right 01/09/2014   Procedure: STEROID INJECTION;  Surgeon: Mcarthur Rossetti, MD;  Location: [place];  Service: Orthopedics;  Laterality: Right;   TEE WITHOUT CARDIOVERSION N/A 04/05/2015   Procedure: TRANSESOPHAGEAL ECHOCARDIOGRAM (TEE);  Surgeon: [name], MD;  Location: [place];  Service: Open Heart Surgery;  Laterality: N/A;   TOTAL HIP ARTHROPLASTY Left 01/09/2014   DR Ninfa Linden   TOTAL HIP ARTHROPLASTY Left 01/09/2014   Procedure: LEFT TOTAL HIP ARTHROPLASTY ANTERIOR APPROACH and Steroid Injection Right hip;  Surgeon: Mcarthur Rossetti, MD;  Location: [place];  Service: Orthopedics;  Laterality: Left;   TOTAL HIP ARTHROPLASTY Right 08/15/2019   TOTAL HIP ARTHROPLASTY Right 08/15/2019   Procedure: RIGHT TOTAL HIP ARTHROPLASTY ANTERIOR APPROACH;  Surgeon: Mcarthur Rossetti, MD;  Location: [place];  Service: Orthopedics;  Laterality: Right;    Social History:   reports that he quit smoking about 34 years ago. His smoking use included cigarettes. He quit smokeless tobacco use about 12 years ago.  His smokeless tobacco use  included chew. He reports that he does not drink alcohol and does not use drugs.  Allergies  Allergen Reactions   Zetia [Ezetimibe] Other (See Comments)    Myalgias    Codeine Nausea And Vomiting        Unithroid [Levothyroxine Sodium] Other (See Comments)    Caused blurry vision per pt    Family History  Problem Relation Age of Onset   Heart disease Father    Heart attack Father    Heart disease Sister    Hypertension Sister    Heart attack Sister    Hypertension Mother    Diabetes Son      Prior to Admission medications   Medication Sig Start Date End Date Taking? Authorizing Provider  allopurinol (ZYLOPRIM) 100 MG tablet Take 100 mg by mouth daily as needed (gout). 05/29/20   [provider]  ALPRAZolam Duanne Moron) 0.5 MG tablet Take 0.5 mg by mouth 3 (three) times daily as needed. 10/07/21   [provider]  amiodarone (PACERONE) 200 MG tablet Take 1 tablet (200 mg total) by mouth daily. 09/11/21   Mercy Riding, MD  apixaban (ELIQUIS) 2.5 MG TABS tablet Take 1 tablet (2.5 mg total) by mouth 2 (two) times daily. 10/16/21 04/14/22  Loel Dubonnet, NP  carvedilol (COREG) 3.125 MG tablet Take 1 tablet (3.125 mg total) by mouth 2 (two) times daily with a meal. 10/16/21 04/14/22  Loel Dubonnet, NP  finasteride (PROSCAR) 5 MG tablet Take 5 mg by mouth daily. 08/18/21   [provider]  furosemide (LASIX) 20 MG tablet Take 1 tablet (20 mg total) by mouth daily. 10/16/21 04/14/22  Loel Dubonnet, NP  levothyroxine (SYNTHROID) 25 MCG tablet Take 25 mcg by mouth daily. 07/02/21   [provider]  nitroGLYCERIN (NITROSTAT) 0.4 MG SL tablet Place 1 tablet (0.4 mg total) under the tongue every 5 (five) minutes as needed for chest pain. 07/09/21   Evans Lance, MD  ondansetron (ZOFRAN) 4 MG tablet Take 4 mg by mouth every 8 (eight) hours as needed for nausea/vomiting. 09/09/21   [provider]  pantoprazole (PROTONIX) 40 MG tablet Take 40 mg by  mouth daily. 09/30/21   [provider]  REPATHA SURECLICK 008 MG/ML SOAJ INJECT 1 PEN INTO THE SKIN EVERY 14 DAYS. 09/16/21   [name], [name], MD  spironolactone (ALDACTONE) 25 MG tablet Take 0.5 tablets (12.5 mg total) by mouth daily. 10/16/21 10/11/22  Loel Dubonnet, NP  tamsulosin (FLOMAX) 0.4 MG CAPS capsule Take 0.4 mg by mouth at bedtime. 03/01/17   [provider]    Physical Exam: Vitals:   10/31/21 2037 10/31/21 2156 10/31/21 2257 11/01/21 0000  BP: (!) 158/83 (!) 176/83 112/77 107/61  Pulse: 76  74 66  Resp: (!) 21 (!) 21 19 17   Temp:  98 F (36.7 C)  97.9 F (36.6 C)  TempSrc:  Oral  Oral  SpO2: 100% 96% 99% 98%  Weight:  66.7 kg      Constitutional: NAD, calm  Eyes: PERTLA, lids and conjunctivae normal ENMT: Mucous membranes are moist. Posterior pharynx clear of any exudate or lesions.   Neck: supple, no masses  Respiratory: no wheezing. Speaking full sentences. No accessory muscle use.  Cardiovascular: S1 & S2 heard, regular rate and rhythm. No extremity edema.  Abdomen: No distension, no tenderness, soft. Bowel sounds active.  Musculoskeletal: no clubbing / cyanosis. No joint deformity upper and lower extremities.   Skin: no significant rashes, lesions, ulcers. Warm, dry, well-perfused. Neurologic: CN 2-12 grossly intact. Moving all extremities. Alert and oriented.  Psychiatric: Very pleasant. Cooperative.    Labs and Imaging on Admission: I have personally reviewed following labs and imaging studies  CBC: Recent Labs  Lab 10/27/21 1312 10/31/21 1632  WBC 8.6 14.3*  NEUTROABS  --  11.7*  HGB 15.7 15.2  HCT 47.9 46.2  MCV 96.0 96.9  PLT 273 676   Basic Metabolic Panel: Recent Labs  Lab 10/27/21 1312 10/31/21 1632  NA 138 140  K 4.6 3.6  CL 103 105  CO2 26 24  GLUCOSE 104* 115*  BUN 15 19  CREATININE 1.40* 1.32*  CALCIUM 9.1 8.7*   GFR: Estimated Creatinine Clearance: 40.3 mL/min (A) (by C-G formula based on SCr of 1.32  mg/dL (H)). Liver Function Tests: Recent Labs  Lab 10/27/21 1312  AST 39  ALT 30  ALKPHOS 56  BILITOT 1.0  PROT 7.1  ALBUMIN 3.8   No results  for input(s): LIPASE, AMYLASE in the last 168 hours. No results for input(s): AMMONIA in the last 168 hours. Coagulation Profile: Recent Labs  Lab 10/27/21 1312  INR 1.2   Cardiac Enzymes: No results for input(s): CKTOTAL, CKMB, CKMBINDEX, TROPONINI in the last 168 hours. BNP (last 3 results) No results for input(s): PROBNP in the last 8760 hours. HbA1C: No results for input(s): HGBA1C in the last 72 hours. CBG: No results for input(s): GLUCAP in the last 168 hours. Lipid Profile: No results for input(s): CHOL, HDL, LDLCALC, TRIG, CHOLHDL, LDLDIRECT in the last 72 hours. Thyroid Function Tests: No results for input(s): TSH, T4TOTAL, FREET4, T3FREE, THYROIDAB in the last 72 hours. Anemia Panel: No results for input(s): VITAMINB12, FOLATE, FERRITIN, TIBC, IRON, RETICCTPCT in the last 72 hours. Urine analysis:    Component Value Date/Time   COLORURINE YELLOW 10/31/2021 2008   APPEARANCEUR CLEAR 10/31/2021 2008   LABSPEC 1.045 (H) 10/31/2021 2008   PHURINE 7.0 10/31/2021 2008   GLUCOSEU >1,000 (A) 10/31/2021 2008   HGBUR NEGATIVE 10/31/2021 2008   BILIRUBINUR NEGATIVE 10/31/2021 2008   KETONESUR 15 (A) 10/31/2021 2008   PROTEINUR TRACE (A) 10/31/2021 2008   UROBILINOGEN 1.0 04/04/2015 2320   NITRITE NEGATIVE 10/31/2021 2008   LEUKOCYTESUR NEGATIVE 10/31/2021 2008   Sepsis Labs: @LABRCNTIP (procalcitonin:4,lacticidven:4) ) Recent Results (from the past 240 hour(s))  Resp Panel by RT-PCR (Flu A&B, Covid) Nasopharyngeal Swab     Status: Abnormal   Collection Time: 10/27/21  1:12 PM   Specimen: Nasopharyngeal Swab; Nasopharyngeal(NP) swabs in vial transport medium  Result Value Ref Range Status   SARS Coronavirus 2 by RT PCR POSITIVE (A) NEGATIVE Final    Comment: RESULT CALLED TO, READ BACK BY AND VERIFIED WITH: P DOWD,RN  1406 10/27/2021 DBRADLEY (NOTE) SARS-CoV-2 target nucleic acids are DETECTED.  The SARS-CoV-2 RNA is generally detectable in upper respiratory specimens during the acute phase of infection. Positive results are indicative of the presence of the identified virus, but do not rule out bacterial infection or co-infection with other pathogens not detected by the test. Clinical correlation with patient history and other diagnostic information is necessary to determine patient infection status. The expected result is Negative.  Fact Sheet for Patients: EntrepreneurPulse.com.au  Fact Sheet for Healthcare Providers: IncredibleEmployment.be  This test is not yet approved or cleared by the [place] FDA and  has been authorized for detection and/or diagnosis of SARS-CoV-2 by FDA under an Emergency Use Authorization (EUA).  This EUA will remain in effect (meaning this test can be  used) for the duration of  the COVID-19 declaration under Section 564(b)(1) of the Act, 21 U.S.C. section 360bbb-3(b)(1), unless the authorization is terminated or revoked sooner.     Influenza A by PCR NEGATIVE NEGATIVE Final   Influenza B by PCR NEGATIVE NEGATIVE Final    Comment: (NOTE) The Xpert Xpress SARS-CoV-2/FLU/RSV plus assay is intended as an aid in the diagnosis of influenza from Nasopharyngeal swab specimens and should not be used as a sole basis for treatment. Nasal washings and aspirates are unacceptable for Xpert Xpress SARS-CoV-2/FLU/RSV testing.  Fact Sheet for Patients: EntrepreneurPulse.com.au  Fact Sheet for Healthcare Providers: IncredibleEmployment.be  This test is not yet approved or cleared by the [place] FDA and has been authorized for detection and/or diagnosis of SARS-CoV-2 by FDA under an Emergency Use Authorization (EUA). This EUA will remain in effect (meaning this test can be used) for the duration  of the COVID-19 declaration under Section 564(b)(1) of the Act,  21 U.S.C. section 360bbb-3(b)(1), unless the authorization is terminated or revoked.  Performed at [facility], [address], [place], [place] [address]   Resp Panel by RT-PCR (Flu A&B, Covid) Nasopharyngeal Swab     Status: Abnormal   Collection Time: 10/31/21  4:32 PM   Specimen: Nasopharyngeal Swab; Nasopharyngeal(NP) swabs in vial transport medium  Result Value Ref Range Status   SARS Coronavirus 2 by RT PCR POSITIVE (A) NEGATIVE Final    Comment: RESULT CALLED TO, READ BACK BY AND VERIFIED WITH: PATTY DOWD RN 0488 [facility] 10/31/21  (NOTE) SARS-CoV-2 target nucleic acids are DETECTED.  The SARS-CoV-2 RNA is generally detectable in upper respiratory specimens during the acute phase of infection. Positive results are indicative of the presence of the identified virus, but do not rule out bacterial infection or co-infection with other pathogens not detected by the test. Clinical correlation with patient history and other diagnostic information is necessary to determine patient infection status. The expected result is Negative.  Fact Sheet for Patients: EntrepreneurPulse.com.au  Fact Sheet for Healthcare Providers: IncredibleEmployment.be  This test is not yet approved or cleared by the [place] FDA and  has been authorized for detection and/or diagnosis of SARS-CoV-2 by FDA under an Emergency Use Authorization (EUA).  This EUA will remain in effect (meaning this test can be  used) for the duration of  the COVID-19 declaration under Section 564(b)(1) of the Act, 21 U.S.C. section 360bbb-3(b)(1), unless the authorization is terminated or revoked sooner.     Influenza A by PCR NEGATIVE NEGATIVE Final   Influenza B by PCR NEGATIVE NEGATIVE Final    Comment: (NOTE) The Xpert Xpress SARS-CoV-2/FLU/RSV plus assay is intended as an aid in the diagnosis of  influenza from Nasopharyngeal swab specimens and should not be used as a sole basis for treatment. Nasal washings and aspirates are unacceptable for Xpert Xpress SARS-CoV-2/FLU/RSV testing.  Fact Sheet for Patients: EntrepreneurPulse.com.au  Fact Sheet for Healthcare Providers: IncredibleEmployment.be  This test is not yet approved or cleared by the [place] FDA and has been authorized for detection and/or diagnosis of SARS-CoV-2 by FDA under an Emergency Use Authorization (EUA). This EUA will remain in effect (meaning this test can be used) for the duration of the COVID-19 declaration under Section 564(b)(1) of the Act, 21 U.S.C. section 360bbb-3(b)(1), unless the authorization is terminated or revoked.  Performed at [facility], [address] [place], [place] [address]      Radiological Exams on Admission: CT Angio Chest PE W/Cm &/Or Wo Cm  Result Date: 10/31/2021 CLINICAL DATA:  Body aches.  Elevated D-dimer.  COVID positive. EXAM: CT ANGIOGRAPHY CHEST WITH CONTRAST TECHNIQUE: Multidetector CT imaging of the chest was performed using the standard protocol during bolus administration of intravenous contrast. Multiplanar CT image reconstructions and MIPs were obtained to evaluate the vascular anatomy. CONTRAST:  49mL OMNIPAQUE IOHEXOL 350 MG/ML SOLN COMPARISON:  Chest x-ray dated October 27, 2021. CTA chest dated September 10, 2021. FINDINGS: Cardiovascular: Satisfactory opacification of the pulmonary arteries to the segmental level. No evidence of pulmonary embolism. Unchanged cardiomegaly. Prior CABG. No pericardial effusion. No thoracic aortic aneurysm. Coronary, aortic arch, and branch vessel atherosclerotic vascular disease. Unchanged left chest wall pacemaker. Mediastinum/Nodes: No enlarged mediastinal, hilar, or axillary lymph nodes. Thyroid gland, trachea, and esophagus demonstrate no significant findings.  Lungs/Pleura: Confluent ground-glass densities in the right lower lobe. No pleural effusion or pneumothorax. Upper Abdomen: No acute abnormality. Musculoskeletal: No chest wall abnormality. No acute or significant  osseous findings. Review of the MIP images confirms the above findings. IMPRESSION: 1. No evidence of pulmonary embolism. 2. Confluent ground-glass densities in the right lower lobe, consistent with pneumonia. 3. Aortic Atherosclerosis (ICD10-I70.0). Electronically Signed   By: Titus Dubin M.D.   On: 10/31/2021 18:14    EKG: Independently reviewed. Atrial flutter, QTc 551 ms.   Assessment/Plan  1. Bacterial PNA; COVID-19  - Pt with recent COVID-19 infection s/p Paxlovid p/w worsening SOB and productive cough after initial improvement and found to have new leukocytosis and RLL consolidation  - COVID pcr positive with cycle threshold 27.5  - Blood cultures collected in ED and Rocephin and azithromycin started  - Continue Rocpehin and azithromycin, trend procalcitonin, follow cultures and clinical course    2. PAF  - Continue Eliquis   3. CAD; PAD    - No anginal complaints or acute ischemia  - Not on antiplatelets but Eliquis, will continue    4. HFpEF  - Appeared hypovolemic in ED and was given a liter of IVF   - EF was 50-55% in Sept 2022  - Hold diuretics initially, monitor volume status   5. Prolonged QT interval  - QTc 551 ms in ED  - Supplement potassium, check magnesium level, minimize QT-prolonging medications, repeat EKG in am   6. CKD IIIa  - SCr is 1.32 on admission, appears to be his baseline  - Renally-dose medications, monitor     DVT prophylaxis: Eliquis  Code Status: Full  Level of Care: Level of care: Med-Surg Family Communication: None available  Disposition Plan:  Patient is from: home  Anticipated d/c is to: Home  Anticipated d/c date is: 11/03/21  Patient currently: pending improvement in respiratory status Consults called: none  Admission  status: Inpatient     Vianne Bulls, MD [facility]  11/01/2021, 12:49 AM

## 2021-11-01 NOTE — Assessment & Plan Note (Signed)
--  consider statin as outpatient

## 2021-11-01 NOTE — Assessment & Plan Note (Signed)
Appears stable 

## 2021-11-02 ENCOUNTER — Encounter (HOSPITAL_COMMUNITY): Payer: Self-pay | Admitting: Family Medicine

## 2021-11-02 DIAGNOSIS — J189 Pneumonia, unspecified organism: Secondary | ICD-10-CM | POA: Diagnosis not present

## 2021-11-02 DIAGNOSIS — I700 Atherosclerosis of aorta: Secondary | ICD-10-CM | POA: Diagnosis not present

## 2021-11-02 DIAGNOSIS — U071 COVID-19: Secondary | ICD-10-CM | POA: Diagnosis not present

## 2021-11-02 DIAGNOSIS — D6869 Other thrombophilia: Secondary | ICD-10-CM | POA: Diagnosis not present

## 2021-11-02 LAB — BASIC METABOLIC PANEL
Anion gap: 8 (ref 5–15)
BUN: 14 mg/dL (ref 8–23)
CO2: 22 mmol/L (ref 22–32)
Calcium: 7.9 mg/dL — ABNORMAL LOW (ref 8.9–10.3)
Chloride: 104 mmol/L (ref 98–111)
Creatinine, Ser: 1.06 mg/dL (ref 0.61–1.24)
GFR, Estimated: >60 mL/min (ref >60)
Glucose, Bld: 109 mg/dL — ABNORMAL HIGH (ref 70–99)
Potassium: 3.9 mmol/L (ref 3.5–5.1)
Sodium: 134 mmol/L — ABNORMAL LOW (ref 135–145)

## 2021-11-02 LAB — CBC
HCT: 39.9 % (ref 39.0–52.0)
Hemoglobin: 13.4 g/dL (ref 13.0–17.0)
MCH: 32.6 pg (ref 26.0–34.0)
MCHC: 33.6 g/dL (ref 30.0–36.0)
MCV: 97.1 fL (ref 80.0–100.0)
Platelets: 203 10*3/uL (ref 150–400)
RBC: 4.11 MIL/uL — ABNORMAL LOW (ref 4.22–5.81)
RDW: 13 % (ref 11.5–15.5)
WBC: 7.5 10*3/uL (ref 4.0–10.5)
nRBC: 0 % (ref 0.0–0.2)

## 2021-11-02 LAB — PROCALCITONIN: Procalcitonin: <0.1 ng/mL

## 2021-11-02 MED ORDER — CEFUROXIME AXETIL 500 MG PO TABS: 500.0000 mg | ORAL_TABLET | Freq: Two times a day (BID) | ORAL | 0 refills | Status: AC

## 2021-11-02 NOTE — Discharge Summary (Signed)
Physician Discharge Summary   Patient name: Ryan [name]  Admit date:     10/31/2021  Discharge date: 11/02/2021  Discharge Physician: Murray Hodgkins   PCP: Ginger Organ., MD   Recommendations at discharge:  Follow-up resolution of pneumonia, aortic atherosclerosis, prolonged QT  Discharge Diagnoses Principal Problem:   Right lower lobe pneumonia Active Problems:   Chronic diastolic CHF (congestive heart failure) (HCC)   PAF (paroxysmal atrial fibrillation) (HCC)   Stage 3a chronic kidney disease (CKD) (HCC)   PAD (peripheral artery disease) (HCC)   Acquired thrombophilia (HCC)   Prolonged QT interval   Aortic atherosclerosis (HCC)  Resolved Diagnoses Resolved Problems:   [place] [facility]   80 year old man PMH including atrial fibrillation on apixaban presented with cough and shortness of breath.  Recent diagnosis COVID-19 treated with Paxil elevated. --11/11 admitted for pneumonia --11/12 responded well and antibiotics, cleared for discharge  * Right lower lobe pneumonia -- No hypoxia.  Responded well to antibiotics, transition to oral antibiotics and continue as an outpatient.  COVID-19-resolved as of 11/02/2021 -- Per chart diagnosed at home around November 1.  Completed Paxlovid at home.  CT did not show typical findings, no hypoxia, no indication for treatment. -- Supportive care  Stage 3a chronic kidney disease (CKD) (HCC) -- Appears stable  PAF (paroxysmal atrial fibrillation) (HCC) -- Stable.  Continue apixaban, amiodarone, carvedilol  Chronic diastolic CHF (congestive heart failure) (HCC) -- Appears stable, resume Lasix and spironolactone on discharge  Acquired thrombophilia ([place]) -- Secondary to atrial fibrillation, continue apixaban  PAD (peripheral artery disease) (HCC) -- Appears stable, continue apixaban.  Could consider statin as an outpatient.  Aortic atherosclerosis (HCC) --consider statin as outpatient  Prolonged QT  interval -- Stable today on EKG. felt to be stable.  Discussed with Dr. Domenic Polite, recommends continuing current medications including amiodarone without change.   Procedures performed: none   Condition at discharge: good  Feels well  Exam Physical Exam Vitals reviewed.  Constitutional:      General: He is not in acute distress. Cardiovascular:     Rate and Rhythm: Normal rate and regular rhythm.     Heart sounds: No murmur heard. Pulmonary:     Effort: Pulmonary effort is normal. No respiratory distress.     Breath sounds: No wheezing, rhonchi or rales.  Psychiatric:        Mood and Affect: Mood normal.        Behavior: Behavior normal.    Disposition: Home  Discharge time: less than 30 minutes.  Follow-up Information     Ginger Organ., MD. Schedule an appointment as soon as possible for a visit in 1 week(s).   Specialty: Internal Medicine Contact information: [address] [place] [address] [phone]         [name], [name], MD .   Specialty: Cardiology Contact information: [address] [place] [address] [phone]                 Allergies as of 11/02/2021       Reactions   Zetia [ezetimibe] Other (See Comments)   Myalgias    Ace Inhibitors Other (See Comments)   Aspirin Other (See Comments)   Atorvastatin Other (See Comments)   Ezetimibe-simvastatin Other (See Comments)   Codeine Nausea And Vomiting       Unithroid [levothyroxine Sodium] Other (See Comments)   Caused blurry vision per pt        Medication  List     TAKE these medications    allopurinol 100 MG tablet Commonly known as: ZYLOPRIM Take 100 mg by mouth daily as needed (gout).   ALPRAZolam 0.5 MG tablet Commonly known as: XANAX Take 0.5 mg by mouth 3 (three) times daily as needed for anxiety or sleep.   amiodarone 200 MG tablet Commonly known as: PACERONE Take 1 tablet (200 mg total) by mouth daily.   apixaban 2.5 MG Tabs  tablet Commonly known as: ELIQUIS Take 1 tablet (2.5 mg total) by mouth 2 (two) times daily.   carvedilol 3.125 MG tablet Commonly known as: COREG Take 1 tablet (3.125 mg total) by mouth 2 (two) times daily with a meal.   cefUROXime 500 MG tablet Commonly known as: CEFTIN Take 1 tablet (500 mg total) by mouth 2 (two) times daily for 7 days.   finasteride 5 MG tablet Commonly known as: PROSCAR Take 5 mg by mouth daily.   furosemide 20 MG tablet Commonly known as: LASIX Take 1 tablet (20 mg total) by mouth daily.   levothyroxine 25 MCG tablet Commonly known as: SYNTHROID Take 25 mcg by mouth daily.   nitroGLYCERIN 0.4 MG SL tablet Commonly known as: NITROSTAT Place 1 tablet (0.4 mg total) under the tongue every 5 (five) minutes as needed for chest pain.   ondansetron 4 MG disintegrating tablet Commonly known as: ZOFRAN-ODT Take 4 mg by mouth every 6 (six) hours as needed for nausea/vomiting.   pantoprazole 40 MG tablet Commonly known as: PROTONIX Take 40 mg by mouth daily.   Repatha SureClick 048 MG/ML Soaj Generic drug: Evolocumab INJECT 1 PEN INTO THE SKIN EVERY 14 DAYS. What changed: See the new instructions.   spironolactone 25 MG tablet Commonly known as: ALDACTONE Take 0.5 tablets (12.5 mg total) by mouth daily.   tamsulosin 0.4 MG Caps capsule Commonly known as: FLOMAX Take 0.4 mg by mouth at bedtime.        CT Angio Chest PE W/Cm &/Or Wo Cm  Result Date: 10/31/2021 CLINICAL DATA:  Body aches.  Elevated D-dimer.  COVID positive. EXAM: CT ANGIOGRAPHY CHEST WITH CONTRAST TECHNIQUE: Multidetector CT imaging of the chest was performed using the standard protocol during bolus administration of intravenous contrast. Multiplanar CT image reconstructions and MIPs were obtained to evaluate the vascular anatomy. CONTRAST:  62mL OMNIPAQUE IOHEXOL 350 MG/ML SOLN COMPARISON:  Chest x-ray dated October 27, 2021. CTA chest dated September 10, 2021. FINDINGS:  Cardiovascular: Satisfactory opacification of the pulmonary arteries to the segmental level. No evidence of pulmonary embolism. Unchanged cardiomegaly. Prior CABG. No pericardial effusion. No thoracic aortic aneurysm. Coronary, aortic arch, and branch vessel atherosclerotic vascular disease. Unchanged left chest wall pacemaker. Mediastinum/Nodes: No enlarged mediastinal, hilar, or axillary lymph nodes. Thyroid gland, trachea, and esophagus demonstrate no significant findings. Lungs/Pleura: Confluent ground-glass densities in the right lower lobe. No pleural effusion or pneumothorax. Upper Abdomen: No acute abnormality. Musculoskeletal: No chest wall abnormality. No acute or significant osseous findings. Review of the MIP images confirms the above findings. IMPRESSION: 1. No evidence of pulmonary embolism. 2. Confluent ground-glass densities in the right lower lobe, consistent with pneumonia. 3. Aortic Atherosclerosis (ICD10-I70.0). Electronically Signed   By: Titus Dubin M.D.   On: 10/31/2021 18:14   DG Chest Portable 1 View  Result Date: 10/27/2021 CLINICAL DATA:  Cough, chest pain, shortness of breath EXAM: PORTABLE CHEST 1 VIEW COMPARISON:  Portable exam 1307 hours compared to 12/10/2021 FINDINGS: LEFT subclavian ICD with lead projecting over RIGHT ventricle. Normal  heart size post CABG. Mediastinal contours and pulmonary vascularity normal. Atherosclerotic calcification aorta. Lungs clear. No acute infiltrate, pleural effusion, or pneumothorax. Bones demineralized with chronic RIGHT rotator cuff tear, LEFT shoulder prosthesis, and a fractures screw at the inferior LEFT glenoid. IMPRESSION: Post CABG and ICD. No acute abnormalities. Aortic Atherosclerosis (ICD10-I70.0). Electronically Signed   By: Lavonia Dana M.D.   On: 10/27/2021 13:20   VAS US CAROTID  Result Date: 10/29/2021 Carotid Arterial Duplex Study Patient Name:  Ryan [name] [facility]  Date of Exam:   10/29/2021 Medical Rec #: [id number]           Accession #:    [id number] Date of Birth: [birth year]          Patient Gender: M Patient Age:   26 years Exam Location:  [facility] Procedure:      VAS US CAROTID Referring Phys: Servando Snare --------------------------------------------------------------------------------  Indications:       Carotid artery disease, Syncope and left endarterectomy. Risk Factors:      Hypertension, coronary artery disease, PAD. Other Factors:     CKD 3. Comparison Study:  CTA 10/01/2021                    R=60%, L=no significant stenosis Performing Technologist: Ronal Fear RVS, RCS  Examination Guidelines: A complete evaluation includes B-mode imaging, spectral Doppler, color Doppler, and power Doppler as needed of all accessible portions of each vessel. Bilateral testing is considered an integral part of a complete examination. Limited examinations for reoccurring indications may be performed as noted.  Right Carotid Findings: +----------+--------+--------+--------+------------------+--------+           PSV cm/sEDV cm/sStenosisPlaque DescriptionComments +----------+--------+--------+--------+------------------+--------+ CCA Prox  63      16              heterogenous               +----------+--------+--------+--------+------------------+--------+ CCA Mid   65      18              homogeneous                +----------+--------+--------+--------+------------------+--------+ CCA Distal47      10              heterogenous               +----------+--------+--------+--------+------------------+--------+ ICA Prox  172     44      40-59%  calcific                   +----------+--------+--------+--------+------------------+--------+ ICA Mid   166     38                                         +----------+--------+--------+--------+------------------+--------+ ICA Distal82      19                                          +----------+--------+--------+--------+------------------+--------+ ECA       202     21              calcific                   +----------+--------+--------+--------+------------------+--------+ +----------+--------+-------+----------------+-------------------+  PSV cm/sEDV cmsDescribe        Arm Pressure (mmHG) +----------+--------+-------+----------------+-------------------+ [id number]             Multiphasic, WNL                    +----------+--------+-------+----------------+-------------------+ +---------+--------+--+--------+-+---------+ VertebralPSV cm/s47EDV cm/s7Antegrade +---------+--------+--+--------+-+---------+  Left Carotid Findings: +----------+--------+--------+--------+------------------+--------+           PSV cm/sEDV cm/sStenosisPlaque DescriptionComments +----------+--------+--------+--------+------------------+--------+ CCA Prox  69      11                                         +----------+--------+--------+--------+------------------+--------+ CCA Mid   71      15              homogeneous                +----------+--------+--------+--------+------------------+--------+ CCA Distal112     13              homogeneous                +----------+--------+--------+--------+------------------+--------+ ICA Prox  108     12      1-39%   homogeneous                +----------+--------+--------+--------+------------------+--------+ ICA Mid   58      11                                         +----------+--------+--------+--------+------------------+--------+ ICA Distal75      11                                         +----------+--------+--------+--------+------------------+--------+ ECA       72      9                                          +----------+--------+--------+--------+------------------+--------+ +----------+--------+--------+----------------+-------------------+           PSV cm/sEDV  cm/sDescribe        Arm Pressure (mmHG) +----------+--------+--------+----------------+-------------------+ Subclavian109             Multiphasic, WNL                    +----------+--------+--------+----------------+-------------------+ +---------+--------+--+--------+--+---------+ VertebralPSV cm/s75EDV cm/s11Antegrade +---------+--------+--+--------+--+---------+   Summary: Right Carotid: Velocities in the right ICA are consistent with a 40-59%                stenosis. Left Carotid: Velocities in the left ICA are consistent with a 1-39% stenosis. Vertebrals:  Bilateral vertebral arteries demonstrate antegrade flow. Subclavians: Normal flow hemodynamics were seen in bilateral subclavian              arteries. *See table(s) above for measurements and observations.  Electronically signed by Servando Snare MD on 10/29/2021 at 3:58:57 PM.    Final    Results for orders placed or performed during the hospital encounter of 10/31/21  Resp Panel by RT-PCR (Flu A&B, Covid) Nasopharyngeal Swab     Status: Abnormal   Collection Time: 10/31/21  4:32 PM   Specimen:  Nasopharyngeal Swab; Nasopharyngeal(NP) swabs in vial transport medium  Result Value Ref Range Status   SARS Coronavirus 2 by RT PCR POSITIVE (A) NEGATIVE Final    Comment: RESULT CALLED TO, READ BACK BY AND VERIFIED WITH: PATTY DOWD RN 132[address] [facility] 10/31/21  (NOTE) SARS-CoV-2 target nucleic acids are DETECTED.  The SARS-CoV-2 RNA is generally detectable in upper respiratory specimens during the acute phase of infection. Positive results are indicative of the presence of the identified virus, but do not rule out bacterial infection or co-infection with other pathogens not detected by the test. Clinical correlation with patient history and other diagnostic information is necessary to determine patient infection status. The expected result is Negative.  Fact Sheet for Patients: EntrepreneurPulse.com.au  Fact Sheet for  Healthcare Providers: IncredibleEmployment.be  This test is not yet approved or cleared by the [place] FDA and  has been authorized for detection and/or diagnosis of SARS-CoV-2 by FDA under an Emergency Use Authorization (EUA).  This EUA will remain in effect (meaning this test can be  used) for the duration of  the COVID-19 declaration under Section 564(b)(1) of the Act, 21 U.S.C. section 360bbb-3(b)(1), unless the authorization is terminated or revoked sooner.     Influenza A by PCR NEGATIVE NEGATIVE Final   Influenza B by PCR NEGATIVE NEGATIVE Final    Comment: (NOTE) The Xpert Xpress SARS-CoV-2/FLU/RSV plus assay is intended as an aid in the diagnosis of influenza from Nasopharyngeal swab specimens and should not be used as a sole basis for treatment. Nasal washings and aspirates are unacceptable for Xpert Xpress SARS-CoV-2/FLU/RSV testing.  Fact Sheet for Patients: EntrepreneurPulse.com.au  Fact Sheet for Healthcare Providers: IncredibleEmployment.be  This test is not yet approved or cleared by the [place] FDA and has been authorized for detection and/or diagnosis of SARS-CoV-2 by FDA under an Emergency Use Authorization (EUA). This EUA will remain in effect (meaning this test can be used) for the duration of the COVID-19 declaration under Section 564(b)(1) of the Act, 21 U.S.C. section 360bbb-3(b)(1), unless the authorization is terminated or revoked.  Performed at [facility], [address] [place], [place] [address]   Culture, blood (routine x 2)     Status: None (Preliminary result)   Collection Time: 10/31/21  6:33 PM   Specimen: BLOOD  Result Value Ref Range Status   Specimen Description   Final    BLOOD BLOOD RIGHT ARM Performed at [facility], [address] [place], [place] [address]    Special Requests   Final    Blood Culture adequate volume  BOTTLES DRAWN AEROBIC AND ANAEROBIC Performed at [facility], [address] [place], [place] [address]    Culture   Final    NO GROWTH 2 DAYS Performed at [facility], [place] [address] [place], [place] [address]    Report Status PENDING  Incomplete  Culture, blood (routine x 2)     Status: None (Preliminary result)   Collection Time: 10/31/21  6:33 PM   Specimen: BLOOD  Result Value Ref Range Status   Specimen Description   Final    BLOOD BLOOD LEFT ARM Performed at [facility], [address] [place], [place] [address]    Special Requests   Final    Blood Culture adequate volume BOTTLES DRAWN AEROBIC AND ANAEROBIC Performed at [facility], [address] [place], [place] [address]    Culture   Final    NO GROWTH 2 DAYS Performed at  [facility], [place] [address], [place], [place] [address]    Report Status PENDING  Incomplete    Signed:  Murray Hodgkins MD.  [facility] 11/02/2021, 11:26 AM

## 2021-11-03 LAB — LEGIONELLA PNEUMOPHILA SEROGP 1 UR AG: L. pneumophila Serogp 1 Ur Ag: NEGATIVE

## 2021-11-05 LAB — CULTURE, BLOOD (ROUTINE X 2)
Culture: NO GROWTH
Culture: NO GROWTH
Special Requests: ADEQUATE
Special Requests: ADEQUATE

## 2021-11-07 ENCOUNTER — Other Ambulatory Visit: Payer: Self-pay | Admitting: Cardiovascular Disease

## 2021-11-10 ENCOUNTER — Telehealth: Payer: Self-pay

## 2021-11-10 NOTE — Telephone Encounter (Signed)
BSC alert - Antitachycardia pacing (ATP) therapy delivered to convert arrhythmia. Event occurred 11/19 @ 20:37, EGM shows AF followed by sustained VT, ATP delivered slowing rate to ~ 154, remains regular.  Presenting irregular VS NSVT event shows AF with RVR, known AF Route to triage LR  Successful telephone encounter to patient to follow up on s/s VT with ATP therapy and medications compliance. Patient states he "briefly fell deathly sick" at time of episode. He did not know what was wrong and prepared to go to the urgent care however symptoms resolved. Denies chest pain/pressure/palpitations but admits to overwhelming nausea and lightheadedness. Patient did not pursue Urgent care visit as symptoms resolved within 20 min. Denies symptom recurrence. He admits to compliance with amiodarone, eliquis, coreg, and all other medications listed in EMR. Reviewed with Dr. Lovena Le. No new orders received. Will continue to monitor. Patient is reminded of [place] driving restrictions and shock plan. Verbalized understanding.

## 2021-11-11 DIAGNOSIS — K219 Gastro-esophageal reflux disease without esophagitis: Secondary | ICD-10-CM | POA: Diagnosis not present

## 2021-11-11 DIAGNOSIS — J1282 Pneumonia due to coronavirus disease 2019: Secondary | ICD-10-CM | POA: Diagnosis not present

## 2021-11-11 DIAGNOSIS — R110 Nausea: Secondary | ICD-10-CM | POA: Diagnosis not present

## 2021-11-11 DIAGNOSIS — I350 Nonrheumatic aortic (valve) stenosis: Secondary | ICD-10-CM | POA: Insufficient documentation

## 2021-11-11 DIAGNOSIS — I480 Paroxysmal atrial fibrillation: Secondary | ICD-10-CM | POA: Diagnosis not present

## 2021-11-11 DIAGNOSIS — R42 Dizziness and giddiness: Secondary | ICD-10-CM | POA: Diagnosis not present

## 2021-11-11 DIAGNOSIS — I472 Ventricular tachycardia, unspecified: Secondary | ICD-10-CM | POA: Diagnosis not present

## 2021-11-11 NOTE — Progress Notes (Signed)
Cardiology Office Note:    Date:  11/12/2021   ID:  BELEN ZWAHLEN, DOB 1941/01/13, MRN [id number]  PCP:  Ginger Organ., MD   [facility] Providers Cardiologist:  Mertie Moores, MD Cardiology APP:  Liliane Shi, PA-C  Electrophysiologist:  Cristopher Peru, MD     Referring MD: Ginger Organ., MD   Chief Complaint:  Hospitalization Follow-up    Patient Profile:   Ryan [name] is a 80 y.o. male with:  Coronary artery disease  S/p CABG in 2016 Cath in 2020: 1/3 grafts patent - med Rx Paroxysmal atrial fibrillation (HFpEF) heart failure with preserved ejection fraction  Echocardiogram 01/2019: EF 50-55 Echocardiogram 12/2020: EF 35-40 Echocardiogram 9/22: EF 50-55 Ventricular tachycardia Admx 12/2020 with monomorphic VT; EF 35-40 S/p ICD Amiodarone Rx Aortic stenosis Echocardiogram 9/22: mod AS (?paradoxical LFLG mod AS) - mean 6.5, V-max 171.75 cm/s, DI 0.31 Hx of DVT/pulmonary embolism - chronic anticoagulation  Peripheral arterial disease  S/p Ao-Bifem bypass (Dr. Donzetta Matters) Carotid artery disease S/p L CEA Hx of CVA BPH Chronic kidney disease  Hypertension  Hyperlipidemia  Hypothyroidism  Prolonged QT Chronic dizziness   History of Present Illness: Ryan [name] was admitted in early 9/22 with decompensated HF.  He was having issues with dizziness at that time and was DC on meclizine prn for vertigo.  Of note, his beta-blocker was changed from Bisoprolol to Carvedilol during this admission.  He was admitted again in 9/22 with intractable n/v of unclear etiology c/b worsening Creatinine.  He was placed on Midodrine due to hypotension.  He had minimally elevated hs-Trops without trend.  He was to be referred to OP PT for vestibular rehab as well.  He had f/u with Cardiology [name], NP) 10/16/21.  He was no longer on midodrine as his BP was improved.  [name] restarted his Spironolactone 12.5 mg once daily.  He was then dx with COVID-19 in the ED and  placed on Paxlovid.  He was admitted 11/11-11/13 with COVID-19 pneumonia.  He responded to therapy well and had a fairly benign course.  On 11/22, our office received an alert that he received ATP.  His device showed atrial fibrillation that progressed to VT.  This was reviewed by Dr. Lovena Le and no changes were made in his Rx.  He returns for f/u.    I spoke to his PCP by phone on 11/22.  [name] saw the patient in the office and noted that he continued to have episodes of nausea and dizziness.  He questions if his symptoms are all due to orthostasis as his BP from lying to standing: 174>>154>>142 vs meds vs AFib.  The pt had stopped Carvedilol for several days after his last admission, but resumed this when he went into atrial fibrillation.  He told [name] that he felt much better off of this medication.    He is here today with his wife.  He has multiple symptoms.  He is quite frustrated and tearful at times.  He notes that his symptoms have only gotten worse over the last several months.  He describes a constellation of symptoms that seem to come together.  He has significant issues with constipation.  He goes several days without a bowel movement.  He feels nauseated at times.  He has associated chest pain with this.  He has radiation to his shoulders.  He can push on his chest to reproduce his pain.  His symptoms do not seem to be  associated with exertion.  He does feel short of breath.  He has not had palpitations.  When he developed rapid atrial fibrillation progressing to VT requiring ATP from his device several nights ago, he had the same symptoms.  At that time, they were much worse.  He does not really describe orthopnea or PND.  His weight is down.  He has no lower extremity edema at this time.  He would like to see gastroenterology  ASSESSMENT & PLAN:   (HFpEF) heart failure with preserved ejection fraction ([place]) He has had variable ejection fractions in the past.  He was admitted in January  of this year with monomorphic VT.  At that time, his EF was down to 35-40%.  During his hospitalization in September, his EF was back to 50-55%.  He has had several admissions with decompensated heart failure.  He now has evidence of orthostatic hypotension on exam both with his primary care doctor yesterday and again today here.  He has had weight loss.  His exam does not suggest significant volume excess.  I suspect he may be at a good euvolemic status.  I think we can cut back on his diuretic.  He weighs daily.  We discussed the importance of adjusting his diuretics should he have significant weight gain.  I do not think that the addition of empagliflozin or spironolactone have contributed to his symptoms.  However, he feels that his symptoms worsened after his admission in September.  At that time, his bisoprolol was switched to carvedilol.  He did tell [name] yesterday that he felt better off carvedilol when this was held for a few days.  As bisoprolol is not lipophilic, hopefully will cause less side effects.  -DC Carvedilol  -Start Bisoprolol 2.5 mg once daily  -Continue Spironolactone 12.5 mg once daily, empagliflozin 10 mg daily  -Decrease furosemide to 20 mg every other day  -Weigh daily  -Patient knows to call if weight increases 3 pounds or more in 1 day  Coronary artery disease History of CABG in 2016.  Cardiac catheterization in 2020 demonstrated patent LIMA-LAD but occluded vein graft to RI and vein graft to RPDA.  There were no targets for PCI and he has been managed medically.  He does have chest discomfort with nausea.  This seems to be associated to constipation.  I am not convinced that he is describing angina.  With all of his dizziness and nausea, I am hesitant to adjust antianginals further at this point.  If his symptoms stabilize, we could consider adding long-acting nitrates or increasing his dose of beta-blocker.  He is not on aspirin as he is on Apixaban.  Continue evolocumab  140 mg every 14 days.  Orthostatic hypotension He does have a significant blood pressure drop and heart rate increased from lying to standing.  His supine blood pressure is somewhat elevated.  He does feel lightheaded when he stands.  As noted, I will reduce his furosemide to every other day.  I have also asked him to wear compression stockings from morning to night.  His orthostasis may be exacerbating some of his symptoms.  Hopefully with reducing his furosemide and wearing compression stockings this will improve somewhat.  If orthostatic hypotension continues to be an issue, in light of his supine hypertension, we may need to try pyridostigmine.  Persistent atrial fibrillation ([place]) He remains on amiodarone.  He has been on this medication for years.  I believe it was initially started for control of atrial  fibrillation.  However, it appears he has been continued on this due to his history of ventricular tachycardia.  Therefore, I think he should remain on amiodarone.  I do not think amiodarone is contributing to his nausea as he has been on this for years.  Recent LFTs were normal.  His TSH was mildly elevated and he is on thyroid replacement.  Recent CT scans did not suggest any evidence of pulmonary fibrosis.  His heart rate is controlled.  Initially, I consider whether or not atrial fibrillation could be contributing to his symptoms.  However, on review of several previous EKGs, he has been in atrial fibrillation for over a year now.  His apixaban dose was reduced in the hospital due to worsening renal function.  His last several creatinines have been normal.  His weight is just over 60 kg.  Therefore, he should be on apixaban 5 mg twice daily.  -Increase apixaban to 5 mg twice daily  Carotid artery disease ([place]) History of left carotid endarterectomy.  Managed by vascular surgery.  Aortic stenosis Echocardiogram in September suggested moderate aortic stenosis and probable paradoxical low-flow  low gradient.  He really has not had significant aortic stenosis on previous echocardiograms.  I do not hear a significant murmur on exam.  I do not think any of his symptoms are explained by aortic stenosis.  We will likely need to repeat an echocardiogram at some point in the next 6 months.  Ventricular tachycardia ([place]) As noted, he remains on amiodarone mainly for control of ventricular tachycardia.  He is status post ICD.  He had recent ATP in the setting of atrial fibrillation with RVR progressing to ventricular tachycardia.  This was likely exacerbated by being off of his beta-blocker.  As noted, carvedilol will be changed to bisoprolol.  Hyperlipidemia LDL goal <70 Labs from [facility] personally reviewed and interpreted.  03/05/2021: LDL 37.  Lipids optimal.  Continue evolocumab 140 mg twice monthly  Nausea He has significant issues with nausea.  This seems to come and go.  He is not eating very well.  He also has significant constipation.  He sometimes goes for days without a bowel movement.  His PCP did advise him to take fiber supplements yesterday.  I have encouraged him to use Metamucil or MiraLAX on a daily basis.  He only takes pantoprazole as needed.  I have asked him to take pantoprazole on a daily basis.  He can also use simethicone for gas.  He still has his gallbladder.  I think he would benefit from reevaluation with gastroenterology as all of his symptoms seem to be gastrointestinal in nature.           Dispo:  Return in about 2 weeks (around 11/26/2021) for Routine follow up in 2 weeks with [name], PA-C.    Prior CV studies: Chest CTA 10/31/2021 No evidence of pulmonary embolism; right lower lobe pneumonia; aortic atherosclerosis  Carotid US 10/29/2021 R ICA 40-59, L ICA 1-39  Echocardiogram 08/28/2021 EF 50-55, anterior/anteroseptal HK, severe asymmetric LVH, moderately reduced RVSF, trivial MR, moderate aortic stenosis, mean gradient 6.5, V-max 171.75 cm/s, DI 0.31 (suspect  paradoxical low-flow low gradient moderate AS), small mobile echodensity adjacent RV pacemaker lead  Echocardiogram 12/28/2020 EF 35-40, moderate asymmetric LVH, normal RVSF, moderate LAE  Cardiac catheterization 01/30/2019 LAD ostial 50, proximal 70, mid 50; D1 ostial 80, 95 RI ostial 90 LCx ostial 65, mid 85 RCA proximal 100 CTO LIMA-mid LAD patent SVG-RI 100 SVG-RPDA 100 Medical therapy  Echocardiogram 01/28/2019 EF 50-55, mild reduced RVSF moderate calcification of aortic valve without aortic stenosis     Past Medical History:  Diagnosis Date   Acquired thrombophilia ([place]) 11/01/2021   Arthritis    CAD (coronary artery disease)    Carotid artery occlusion    Cataract    Bil eyes/worse in left eye   CHF (congestive heart failure) (HCC)    Chronic back pain    COVID-19 10/31/2021   DVT (deep venous thrombosis) (HCC)    Dysrhythmia    Enlarged prostate    takes Rapaflo daily   GERD (gastroesophageal reflux disease)    occasional   History of colon polyps    History of gout    has colchicine prn   History of kidney stones    Hyperlipidemia    takes Crestor daily   Hypertension    takes Amlodipine daily   Hypothyroidism    Myocardial infarction [facility])    Peripheral vascular disease (HCC)    Prolonged QT interval 11/01/2021   Pulmonary emboli ([place]) 03/20/2015   elevated d-dimer, intermediate V/Q study, atypical chest pain and SOB. Start on Xarelto 20mg  BID for 3 month   Rapid atrial fibrillation (HCC)    Renal insufficiency    Shortness of breath dyspnea    Urinary frequency    Urinary urgency    Current Medications: Current Meds  Medication Sig   allopurinol (ZYLOPRIM) 100 MG tablet Take 100 mg by mouth daily as needed (gout).   ALPRAZolam (XANAX) 0.5 MG tablet Take 0.5 mg by mouth 3 (three) times daily as needed for anxiety or sleep.   amiodarone (PACERONE) 200 MG tablet Take 1 tablet (200 mg total) by mouth daily.   bisoprolol (ZEBETA) 5 MG tablet Take 0.5  tablets (2.5 mg total) by mouth daily.   empagliflozin (JARDIANCE) 10 MG TABS tablet Take 1 tablet (10 mg total) by mouth daily.   finasteride (PROSCAR) 5 MG tablet Take 5 mg by mouth daily.   levothyroxine (SYNTHROID) 25 MCG tablet Take 25 mcg by mouth daily.   nitroGLYCERIN (NITROSTAT) 0.4 MG SL tablet Place 1 tablet (0.4 mg total) under the tongue every 5 (five) minutes as needed for chest pain.   ondansetron (ZOFRAN-ODT) 4 MG disintegrating tablet Take 4 mg by mouth every 6 (six) hours as needed for nausea/vomiting.   pantoprazole (PROTONIX) 40 MG tablet Take 40 mg by mouth daily.   pantoprazole (PROTONIX) 40 MG tablet Take 1 tablet (40 mg total) by mouth daily.   REPATHA SURECLICK 169 MG/ML SOAJ INJECT 1 PEN INTO THE SKIN EVERY 14 DAYS. (Patient taking differently: Inject 140 mg into the skin every 14 (fourteen) days.)   spironolactone (ALDACTONE) 25 MG tablet Take 0.5 tablets (12.5 mg total) by mouth daily.   tamsulosin (FLOMAX) 0.4 MG CAPS capsule Take 0.4 mg by mouth at bedtime.   [DISCONTINUED] apixaban (ELIQUIS) 2.5 MG TABS tablet Take 1 tablet (2.5 mg total) by mouth 2 (two) times daily.   [DISCONTINUED] carvedilol (COREG) 3.125 MG tablet Take 1 tablet (3.125 mg total) by mouth 2 (two) times daily with a meal.   [DISCONTINUED] furosemide (LASIX) 20 MG tablet Take 1 tablet (20 mg total) by mouth daily.    Allergies:   Zetia [ezetimibe], Ace inhibitors, Aspirin, Atorvastatin, Ezetimibe-simvastatin, Codeine, and Unithroid [levothyroxine sodium]   Social History   Tobacco Use   Smoking status: Former    Types: Cigarettes    Quit date: 02/04/1987    Years since quitting: 69.7  Smokeless tobacco: Former    Types: Chew    Quit date: 07/20/2009   Tobacco comments:    quit 35+yrs ago  Vaping Use   Vaping Use: Never used  Substance Use Topics   Alcohol use: No    Alcohol/week: 0.0 standard drinks   Drug use: No    Family Hx: The patient's family history includes Diabetes in his  son; Heart attack in his father and sister; Heart disease in his father and sister; Hypertension in his mother and sister.  Review of Systems  Constitutional: Negative for fever.  Gastrointestinal:  Positive for anorexia and constipation. Negative for hematochezia and melena.  Genitourinary:  Negative for hematuria.    EKGs/Labs/Other Test Reviewed:    EKG:  EKG is  ordered today.  The ekg ordered today demonstrates atrial fibrillation, HR 72, normal axis, QTC 488  Recent Labs: 09/11/2021: B Natriuretic Peptide 853.2; TSH 5.069 10/27/2021: ALT 30 11/01/2021: Magnesium 1.8 11/02/2021: BUN 14; Creatinine, Ser 1.06; Hemoglobin 13.4; Platelets 203; Potassium 3.9; Sodium 134   Recent Lipid Panel Lab Results  Component Value Date/Time   CHOL 99 (L) 07/29/2020 12:13 PM   TRIG 72 07/29/2020 12:13 PM   HDL 39 (L) 07/29/2020 12:13 PM   LDLCALC 45 07/29/2020 12:13 PM     Risk Assessment/Calculations:    CHA2DS2-VASc Score = 7   This indicates a 11.2% annual risk of stroke. The patient's score is based upon: CHF History: 1 HTN History: 1 Diabetes History: 0 Stroke History: 2 Vascular Disease History: 1 Age Score: 2 Gender Score: 0         Physical Exam:    VS:  BP 136/80   Pulse 75   Ht 5\' 6"  (1.676 m)   Wt 144 lb 12.8 oz (65.7 kg)   SpO2 98%   BMI 23.37 kg/m     Orthostatic VS for the past 24 hrs (Last 3 readings):  BP- Lying Pulse- Lying BP- Sitting Pulse- Sitting BP- Standing at 0 minutes Pulse- Standing at 0 minutes BP- Standing at 3 minutes Pulse- Standing at 3 minutes  11/12/21 1130 169/81 65 138/79 84 144/68 90 161/82 82    Wt Readings from Last 3 Encounters:  11/12/21 144 lb 12.8 oz (65.7 kg)  11/02/21 145 lb 4.5 oz (65.9 kg)  10/29/21 145 lb (65.8 kg)    Physical Exam     Medication Adjustments/Labs and Tests Ordered: Current medicines are reviewed at length with the patient today.  Concerns regarding medicines are outlined above.  Tests Ordered: Orders  Placed This Encounter  Procedures   Ambulatory referral to Gastroenterology   EKG 12-Lead    Medication Changes: Meds ordered this encounter  Medications   apixaban (ELIQUIS) 5 MG TABS tablet    Sig: Take 1 tablet (5 mg total) by mouth 2 (two) times daily.    Dispense:  180 tablet    Refill:  3   furosemide (LASIX) 20 MG tablet    Sig: Take 1 tablet (20 mg total) by mouth every other day.    Dispense:  90 tablet    Refill:  1   pantoprazole (PROTONIX) 40 MG tablet    Sig: Take 1 tablet (40 mg total) by mouth daily.    Dispense:  30 tablet    Refill:  11   bisoprolol (ZEBETA) 5 MG tablet    Sig: Take 0.5 tablets (2.5 mg total) by mouth daily.    Dispense:  45 tablet    Refill:  3  Signed, [name], PA-C  11/12/2021 6:02 PM    [facility] [place], [place], [place]  [address] Phone: [phone]; Fax: [phone]

## 2021-11-12 ENCOUNTER — Other Ambulatory Visit: Payer: Self-pay

## 2021-11-12 ENCOUNTER — Encounter: Payer: Self-pay | Admitting: Physician Assistant

## 2021-11-12 ENCOUNTER — Ambulatory Visit (INDEPENDENT_AMBULATORY_CARE_PROVIDER_SITE_OTHER): Payer: Medicare Other | Admitting: Physician Assistant

## 2021-11-12 VITALS — BP 136/80 | HR 75 | Ht 66.0 in | Wt 144.8 lb

## 2021-11-12 DIAGNOSIS — I10 Essential (primary) hypertension: Secondary | ICD-10-CM

## 2021-11-12 DIAGNOSIS — I6523 Occlusion and stenosis of bilateral carotid arteries: Secondary | ICD-10-CM | POA: Diagnosis not present

## 2021-11-12 DIAGNOSIS — I2510 Atherosclerotic heart disease of native coronary artery without angina pectoris: Secondary | ICD-10-CM | POA: Diagnosis not present

## 2021-11-12 DIAGNOSIS — I472 Ventricular tachycardia, unspecified: Secondary | ICD-10-CM | POA: Diagnosis not present

## 2021-11-12 DIAGNOSIS — E785 Hyperlipidemia, unspecified: Secondary | ICD-10-CM

## 2021-11-12 DIAGNOSIS — I4819 Other persistent atrial fibrillation: Secondary | ICD-10-CM

## 2021-11-12 DIAGNOSIS — I951 Orthostatic hypotension: Secondary | ICD-10-CM | POA: Diagnosis not present

## 2021-11-12 DIAGNOSIS — I480 Paroxysmal atrial fibrillation: Secondary | ICD-10-CM

## 2021-11-12 DIAGNOSIS — I350 Nonrheumatic aortic (valve) stenosis: Secondary | ICD-10-CM | POA: Diagnosis not present

## 2021-11-12 DIAGNOSIS — I739 Peripheral vascular disease, unspecified: Secondary | ICD-10-CM

## 2021-11-12 DIAGNOSIS — N1831 Chronic kidney disease, stage 3a: Secondary | ICD-10-CM

## 2021-11-12 DIAGNOSIS — I5032 Chronic diastolic (congestive) heart failure: Secondary | ICD-10-CM

## 2021-11-12 DIAGNOSIS — R110 Nausea: Secondary | ICD-10-CM | POA: Diagnosis not present

## 2021-11-12 DIAGNOSIS — R42 Dizziness and giddiness: Secondary | ICD-10-CM

## 2021-11-12 MED ORDER — APIXABAN 5 MG PO TABS: 5.0000 mg | ORAL_TABLET | Freq: Two times a day (BID) | ORAL | 3 refills | Status: DC

## 2021-11-12 MED ORDER — PANTOPRAZOLE SODIUM 40 MG PO TBEC: 40.0000 mg | DELAYED_RELEASE_TABLET | Freq: Every day | ORAL | 11 refills | Status: AC

## 2021-11-12 MED ORDER — BISOPROLOL FUMARATE 5 MG PO TABS: 2.5000 mg | ORAL_TABLET | Freq: Every day | ORAL | 3 refills | Status: DC

## 2021-11-12 MED ORDER — FUROSEMIDE 20 MG PO TABS: 20.0000 mg | ORAL_TABLET | ORAL | 1 refills | Status: DC

## 2021-11-12 NOTE — Assessment & Plan Note (Addendum)
He has had variable ejection fractions in the past.  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  At that time, his EF was down to 35-40%.  During his hospitalization in September, his EF was back to 50-55%.  He has had several admissions with decompensated heart failure.  He now has evidence of orthostatic hypotension on exam both with his primary care doctor yesterday and again today here.  He has had weight loss.  His exam does not suggest significant volume excess.  I suspect he may be at a good euvolemic status.  I think we can cut back on his diuretic.  He weighs daily.  We discussed the importance of adjusting his diuretics should he have significant weight gain.  I do not think that the addition of empagliflozin or spironolactone have contributed to his symptoms.  However, he feels that his symptoms worsened after his admission in September.  At that time, his bisoprolol was switched to carvedilol.  He did tell [name] yesterday that he felt better off carvedilol when this was held for a few days.  As bisoprolol is not lipophilic, hopefully will cause less side effects.  -DC Carvedilol  -Start Bisoprolol 2.5 mg once daily  -Continue Spironolactone 12.5 mg once daily, empagliflozin 10 mg daily  -Decrease furosemide to 20 mg every other day  -Weigh daily  -Patient knows to call if weight increases 3 pounds or more in 1 day

## 2021-11-12 NOTE — Assessment & Plan Note (Signed)
He remains on amiodarone.  He has been on this medication for years.  I believe it was initially started for control of atrial fibrillation.  However, it appears he has been continued on this due to his history of ventricular tachycardia.  Therefore, I think he should remain on amiodarone.  I do not think amiodarone is contributing to his nausea as he has been on this for years.  Recent LFTs were normal.  His TSH was mildly elevated and he is on thyroid replacement.  Recent CT scans did not suggest any evidence of pulmonary fibrosis.  His heart rate is controlled.  Initially, I consider whether or not atrial fibrillation could be contributing to his symptoms.  However, on review of several previous EKGs, he has been in atrial fibrillation for over a year now.  His apixaban dose was reduced in the hospital due to worsening renal function.  His last several creatinines have been normal.  His weight is just over 60 kg.  Therefore, he should be on apixaban 5 mg twice daily.  -Increase apixaban to 5 mg twice daily

## 2021-11-12 NOTE — Assessment & Plan Note (Signed)
History of left carotid endarterectomy.  Managed by vascular surgery.

## 2021-11-12 NOTE — Patient Instructions (Signed)
Medication Instructions:    DISCONTINUE Coreg  CHANGE Lasix one (1) tablet ( 20 mg) every other day.  START Bisoprolol one half (1/2) tablet by mouth ( 2.5 mg) daily.  INCREASE Eliquis one (1) tablet by mouth ( 5 mg ) twice daily.   START Protonix one (1) tablet by mouth ( 40 mg) daily.  START Metamucil every day this is Over the Counter.  START Miralax everyday this is Over the Counter.   *If you need a refill on your cardiac medications before your next appointment, please call your pharmacy*   Lab Work:  -NONE  If you have labs (blood work) drawn today and your tests are completely normal, you will receive your results only by: [place] (if you have MyChart) OR A paper copy in the mail If you have any lab test that is abnormal or we need to change your treatment, we will call you to review the results.   Testing/Procedures:  .NONE-   Follow-Up: At [facility], you and your health needs are our priority.  As part of our continuing mission to provide you with exceptional heart care, we have created designated Provider Care Teams.  These Care Teams include your primary Cardiologist (physician) and Advanced Practice Providers (APPs -  Physician Assistants and Nurse Practitioners) who all work together to provide you with the care you need, when you need it.  We recommend signing up for the patient portal called "MyChart".  Sign up information is provided on this After Visit Summary.  MyChart is used to connect with patients for Virtual Visits (Telemedicine).  Patients are able to view lab/test results, encounter notes, upcoming appointments, etc.  Non-urgent messages can be sent to your provider as well.   To learn more about what you can do with MyChart, go to [email or web address].    Your next appointment:   2 week(s)  The format for your next appointment:   In Person  Provider:   [name], PA-C         Other Instructions  You have been referred  to GI doctor.  The GI doctor's office will call you to schedule appointment.   Please Weigh daily if your weight increases 3 lbs in 1 day call office at [phone].  Please wear compression stockings every day.

## 2021-11-12 NOTE — Assessment & Plan Note (Signed)
Echocardiogram in September suggested moderate aortic stenosis and probable paradoxical low-flow low gradient.  He really has not had significant aortic stenosis on previous echocardiograms.  I do not hear a significant murmur on exam.  I do not think any of his symptoms are explained by aortic stenosis.  We will likely need to repeat an echocardiogram at some point in the next 6 months.

## 2021-11-12 NOTE — Assessment & Plan Note (Addendum)
He does have a significant blood pressure drop and heart rate increased from lying to standing.  His supine blood pressure is somewhat elevated.  He does feel lightheaded when he stands.  As noted, I will reduce his furosemide to every other day.  I have also asked him to wear compression stockings from morning to night.  His orthostasis may be exacerbating some of his symptoms.  Hopefully with reducing his furosemide and wearing compression stockings this will improve somewhat.  If orthostatic hypotension continues to be an issue, in light of his supine hypertension, we may need to try pyridostigmine.

## 2021-11-12 NOTE — Assessment & Plan Note (Signed)
As noted, he remains on amiodarone mainly for control of ventricular tachycardia.  He is status post ICD.  He had recent ATP in the setting of atrial fibrillation with RVR progressing to ventricular tachycardia.  This was likely exacerbated by being off of his beta-blocker.  As noted, carvedilol will be changed to bisoprolol.

## 2021-11-12 NOTE — Assessment & Plan Note (Signed)
Labs from [facility] personally reviewed and interpreted.  03/05/2021: LDL 37.  Lipids optimal.  Continue evolocumab 140 mg twice monthly

## 2021-11-12 NOTE — Assessment & Plan Note (Addendum)
He has significant issues with nausea.  This seems to come and go.  He is not eating very well.  He also has significant constipation.  He sometimes goes for days without a bowel movement.  His PCP did advise him to take fiber supplements yesterday.  I have encouraged him to use Metamucil or MiraLAX on a daily basis.  He only takes pantoprazole as needed.  I have asked him to take pantoprazole on a daily basis.  He can also use simethicone for gas.  He still has his gallbladder.  I think he would benefit from reevaluation with gastroenterology as all of his symptoms seem to be gastrointestinal in nature.

## 2021-11-12 NOTE — Assessment & Plan Note (Signed)
History of CABG in 2016.  Cardiac catheterization in 2020 demonstrated patent LIMA-LAD but occluded vein graft to RI and vein graft to RPDA.  There were no targets for PCI and he has been managed medically.  He does have chest discomfort with nausea.  This seems to be associated to constipation.  I am not convinced that he is describing angina.  With all of his dizziness and nausea, I am hesitant to adjust antianginals further at this point.  If his symptoms stabilize, we could consider adding long-acting nitrates or increasing his dose of beta-blocker.  He is not on aspirin as he is on Apixaban.  Continue evolocumab 140 mg every 14 days.

## 2021-11-13 ENCOUNTER — Other Ambulatory Visit: Payer: Self-pay

## 2021-11-13 ENCOUNTER — Emergency Department (HOSPITAL_BASED_OUTPATIENT_CLINIC_OR_DEPARTMENT_OTHER)
Admission: EM | Admit: 2021-11-13 | Discharge: 2021-11-13 | Disposition: A | Discharge status: Home / Self Care | Payer: Medicare Other | Attending: Emergency Medicine | Admitting: Emergency Medicine

## 2021-11-13 ENCOUNTER — Encounter (HOSPITAL_BASED_OUTPATIENT_CLINIC_OR_DEPARTMENT_OTHER): Payer: Self-pay | Admitting: Obstetrics and Gynecology

## 2021-11-13 ENCOUNTER — Emergency Department (HOSPITAL_BASED_OUTPATIENT_CLINIC_OR_DEPARTMENT_OTHER): Payer: Medicare Other | Admitting: Radiology

## 2021-11-13 DIAGNOSIS — R072 Precordial pain: Secondary | ICD-10-CM | POA: Diagnosis not present

## 2021-11-13 DIAGNOSIS — Z951 Presence of aortocoronary bypass graft: Secondary | ICD-10-CM | POA: Diagnosis not present

## 2021-11-13 DIAGNOSIS — Z87891 Personal history of nicotine dependence: Secondary | ICD-10-CM | POA: Diagnosis not present

## 2021-11-13 DIAGNOSIS — Z8616 Personal history of COVID-19: Secondary | ICD-10-CM | POA: Diagnosis not present

## 2021-11-13 DIAGNOSIS — N1831 Chronic kidney disease, stage 3a: Secondary | ICD-10-CM | POA: Diagnosis not present

## 2021-11-13 DIAGNOSIS — I130 Hypertensive heart and chronic kidney disease with heart failure and stage 1 through stage 4 chronic kidney disease, or unspecified chronic kidney disease: Secondary | ICD-10-CM | POA: Diagnosis not present

## 2021-11-13 DIAGNOSIS — K5900 Constipation, unspecified: Secondary | ICD-10-CM | POA: Diagnosis not present

## 2021-11-13 DIAGNOSIS — Z79899 Other long term (current) drug therapy: Secondary | ICD-10-CM | POA: Insufficient documentation

## 2021-11-13 DIAGNOSIS — I4821 Permanent atrial fibrillation: Secondary | ICD-10-CM | POA: Diagnosis not present

## 2021-11-13 DIAGNOSIS — R0602 Shortness of breath: Secondary | ICD-10-CM | POA: Insufficient documentation

## 2021-11-13 DIAGNOSIS — E039 Hypothyroidism, unspecified: Secondary | ICD-10-CM | POA: Diagnosis not present

## 2021-11-13 DIAGNOSIS — Z96643 Presence of artificial hip joint, bilateral: Secondary | ICD-10-CM | POA: Diagnosis not present

## 2021-11-13 DIAGNOSIS — Z96619 Presence of unspecified artificial shoulder joint: Secondary | ICD-10-CM | POA: Diagnosis not present

## 2021-11-13 DIAGNOSIS — I509 Heart failure, unspecified: Secondary | ICD-10-CM | POA: Diagnosis not present

## 2021-11-13 DIAGNOSIS — I2510 Atherosclerotic heart disease of native coronary artery without angina pectoris: Secondary | ICD-10-CM | POA: Insufficient documentation

## 2021-11-13 DIAGNOSIS — I517 Cardiomegaly: Secondary | ICD-10-CM | POA: Diagnosis not present

## 2021-11-13 DIAGNOSIS — R112 Nausea with vomiting, unspecified: Secondary | ICD-10-CM | POA: Diagnosis not present

## 2021-11-13 DIAGNOSIS — R110 Nausea: Secondary | ICD-10-CM | POA: Diagnosis not present

## 2021-11-13 DIAGNOSIS — Z7901 Long term (current) use of anticoagulants: Secondary | ICD-10-CM | POA: Diagnosis not present

## 2021-11-13 DIAGNOSIS — R079 Chest pain, unspecified: Secondary | ICD-10-CM | POA: Diagnosis not present

## 2021-11-13 LAB — LIPASE, BLOOD: Lipase: 21 U/L (ref 11–51)

## 2021-11-13 LAB — TROPONIN I (HIGH SENSITIVITY)
Troponin I (High Sensitivity): 40 ng/L — ABNORMAL HIGH (ref <18)
Troponin I (High Sensitivity): 39 ng/L — ABNORMAL HIGH (ref <18)

## 2021-11-13 LAB — CBC
HCT: 45.6 % (ref 39.0–52.0)
Hemoglobin: 14.9 g/dL (ref 13.0–17.0)
MCH: 31.8 pg (ref 26.0–34.0)
MCHC: 32.7 g/dL (ref 30.0–36.0)
MCV: 97.2 fL (ref 80.0–100.0)
Platelets: 275 10*3/uL (ref 150–400)
RBC: 4.69 MIL/uL (ref 4.22–5.81)
RDW: 13.2 % (ref 11.5–15.5)
WBC: 8.8 10*3/uL (ref 4.0–10.5)
nRBC: 0 % (ref 0.0–0.2)

## 2021-11-13 LAB — BASIC METABOLIC PANEL
Anion gap: 9 (ref 5–15)
BUN: 16 mg/dL (ref 8–23)
CO2: 27 mmol/L (ref 22–32)
Calcium: 9.7 mg/dL (ref 8.9–10.3)
Chloride: 102 mmol/L (ref 98–111)
Creatinine, Ser: 1.2 mg/dL (ref 0.61–1.24)
GFR, Estimated: >60 mL/min (ref >60)
Glucose, Bld: 102 mg/dL — ABNORMAL HIGH (ref 70–99)
Potassium: 4.4 mmol/L (ref 3.5–5.1)
Sodium: 138 mmol/L (ref 135–145)

## 2021-11-13 LAB — HEPATIC FUNCTION PANEL
ALT: 14 U/L (ref 0–44)
AST: 25 U/L (ref 15–41)
Albumin: 4 g/dL (ref 3.5–5.0)
Alkaline Phosphatase: 53 U/L (ref 38–126)
Bilirubin, Direct: 0.3 mg/dL — ABNORMAL HIGH (ref 0.0–0.2)
Indirect Bilirubin: 0.9 mg/dL (ref 0.3–0.9)
Total Bilirubin: 1.2 mg/dL (ref 0.3–1.2)
Total Protein: 6.9 g/dL (ref 6.5–8.1)

## 2021-11-13 LAB — BRAIN NATRIURETIC PEPTIDE: B Natriuretic Peptide: 787.3 pg/mL — ABNORMAL HIGH (ref 0.0–100.0)

## 2021-11-13 MED ORDER — METOCLOPRAMIDE HCL 5 MG PO TABS: 5.0000 mg | ORAL_TABLET | Freq: Four times a day (QID) | ORAL | 0 refills | Status: DC | PRN

## 2021-11-13 MED ORDER — METOCLOPRAMIDE HCL 5 MG/ML IJ SOLN
5.0000 mg | Freq: Once | INTRAMUSCULAR | Status: AC
  Administered 2021-11-13: 5 mg via INTRAVENOUS
  Filled 2021-11-13: qty 2

## 2021-11-13 NOTE — ED Notes (Signed)
Pt taken to restroom in wheelchair. Pt toileted independently and easily self transferred between bed and wheelchair and toilet.

## 2021-11-13 NOTE — ED Triage Notes (Signed)
Patient reports to the ER for chest pain and shortness of breath. Patient reports he was recently hospitalized for pneumonia.

## 2021-11-13 NOTE — Discharge Instructions (Signed)
Please read and follow all provided instructions.  Your diagnoses today include:  1. Nausea   2. Constipation, unspecified constipation type   3. Precordial pain     Tests performed today include: An EKG of your heart A chest x-ray Cardiac enzymes - a blood test for heart muscle damage Blood counts and electrolytes Vital signs. See below for your results today.   Medications prescribed:  Reglan: Medication for nausea that can also help with dizziness  Take any prescribed medications only as directed.  Follow-up instructions: Please follow-up with your primary care provider as soon as you can for further evaluation of your symptoms.   Return instructions:  SEEK IMMEDIATE MEDICAL ATTENTION IF: You have severe chest pain, especially if the pain is crushing or pressure-like and spreads to the arms, back, neck, or jaw, or if you have sweating, nausea (feeling sick to your stomach), or shortness of breath. THIS IS AN EMERGENCY. Don't wait to see if the pain will go away. Get medical help at once. Call 911 or 0 (operator). DO NOT drive yourself to the hospital.  Your chest pain gets worse and does not go away with rest.  You have an attack of chest pain lasting longer than usual, despite rest and treatment with the medications your caregiver has prescribed.  You wake from sleep with chest pain or shortness of breath. You feel dizzy or faint. You have chest pain not typical of your usual pain for which you originally saw your caregiver.  You have any other emergent concerns regarding your health.  Additional Information: Chest pain comes from many different causes. Your caregiver has diagnosed you as having chest pain that is not specific for one problem, but does not require admission.  You are at low risk for an acute heart condition or other serious illness.   Your vital signs today were: BP (!) 158/76 (BP Location: Right Arm)   Pulse 74   Temp 98.3 F (36.8 C)   Resp 18   SpO2  98%  If your blood pressure (BP) was elevated above 135/85 this visit, please have this repeated by your doctor within one month. --------------

## 2021-11-13 NOTE — ED Provider Notes (Signed)
[facility] DEPT Provider Note   CSN: [id number] Arrival date & time: 11/13/21  1429     History Chief Complaint  Patient presents with   Chest Pain   Shortness of Breath         Ryan [name] is a 80 y.o. male.  Patient with history of coronary artery disease, congestive heart failure, ICD placement, permanent A. fib on anticoagulation with Eliquis and antiarrhythmic amiodarone --presents the emergency department for multiple symptoms.  Patient has had multiple recent chronic complaints and several hospitalizations over the past 3 months.  Patient reports that last night he developed pain in his left chest with radiation through to his shoulder blade.  He also had severe nausea without vomiting.  This seems to have worsened last night however patient has noted this chest pain in the past.  This was noted in his cardiology provider note from yesterday.  Patient states that during that appointment he was actually feeling pretty well.  He did have a recent run of atrial fibrillation with progression to VT which was shocked.  Patient was apparently unaware of this.  He has had constipation that has been chronic in nature.  No associated diarrhea overnight.  He has had shortness of breath but it is unclear if this is chronic for the patient.  He feels just generally "sick".  He took a antinausea medicine about 3 hours ago and it did not help much.  Otherwise no URI symptoms.  No abdominal tenderness.  No worsening leg swelling. The onset of this condition was acute on chronic. The course is constant. Aggravating factors: none. Alleviating factors: none.        Past Medical History:  Diagnosis Date   Acquired thrombophilia ([place]) 11/01/2021   Arthritis    CAD (coronary artery disease)    Carotid artery occlusion    Cataract    Bil eyes/worse in left eye   CHF (congestive heart failure) (HCC)    Chronic back pain    COVID-19 10/31/2021   DVT (deep venous  thrombosis) (HCC)    Dysrhythmia    Enlarged prostate    takes Rapaflo daily   GERD (gastroesophageal reflux disease)    occasional   History of colon polyps    History of gout    has colchicine prn   History of kidney stones    Hyperlipidemia    takes Crestor daily   Hypertension    takes Amlodipine daily   Hypothyroidism    Myocardial infarction [facility])    Peripheral vascular disease (HCC)    Prolonged QT interval 11/01/2021   Pulmonary emboli ([place]) 03/20/2015   elevated d-dimer, intermediate V/Q study, atypical chest pain and SOB. Start on Xarelto 20mg  BID for 3 month   Rapid atrial fibrillation [facility])    Renal insufficiency    Shortness of breath dyspnea    Urinary frequency    Urinary urgency     Patient Active Problem List   Diagnosis Date Noted   Orthostatic hypotension 11/12/2021   Nausea 11/12/2021   Aortic stenosis 11/11/2021   Right lower lobe pneumonia 11/01/2021   Prolonged QT interval 11/01/2021   Acquired thrombophilia ([place]) 11/01/2021   Aortic atherosclerosis ([place]) 11/01/2021   Intractable nausea and vomiting 09/10/2021   Hypoxemia    Status post implantation of automatic cardioverter/defibrillator (AICD) 03/22/2021   Ventricular tachycardia 12/28/2020   (HFpEF) heart failure with preserved ejection fraction ([place]) 12/28/2020   NSTEMI (non-ST elevated myocardial infarction) ([place])  Acute respiratory failure with hypoxia (HCC)    S/P total hip arthroplasty 08/16/2019   Unilateral primary osteoarthritis, right hip 07/18/2019   Persistent atrial fibrillation ([place]) 01/27/2019   Dizziness 01/27/2019   History of pneumonia 01/27/2019   Stage 3a chronic kidney disease (CKD) ([place]) 01/27/2019   Chest pain 02/07/2018   Elevated troponin 03/23/2017   Sigmoid diverticulitis 03/23/2017   Wrist pain, acute, right    Subclinical hypothyroidism    Dyspnea 02/26/2017   Shoulder blade pain 02/26/2017   SOB (shortness of breath) 02/26/2017   Chronic right shoulder  pain    Hyperlipidemia LDL goal <70 01/20/2017   Chronic cholecystitis 07/27/2016   PAD (peripheral artery disease) ([place]) 07/09/2015   S/P CABG x 4 04/05/2015   Atypical chest pain 03/20/2015   Carotid artery disease ([place]) 10/09/2014   PVD (peripheral vascular disease) ([place]) 10/09/2014   HTN (hypertension) 05/30/2014   Arthritis of left hip 01/09/2014   Status post THR (total hip replacement) 01/09/2014   Spinal stenosis, lumbar 01/06/2013    Class: Diagnosis of   Coronary artery disease 01/05/2013   Atherosclerosis of native artery of extremity with intermittent claudication ([place]) 10/18/2012    Past Surgical History:  Procedure Laterality Date   APPENDECTOMY     BACK SURGERY     5 times   big toe surgery     CARDIAC CATHETERIZATION     2010    dr Acie Fredrickson   cataract surgery     left eye   CHOLECYSTECTOMY N/A 07/27/2016   Procedure: LAPAROSCOPIC CHOLECYSTECTOMY;  Surgeon: Mickeal Skinner, MD;  Location: [facility];  Service: General;  Laterality: N/A;   COLONOSCOPY     CORONARY ARTERY BYPASS GRAFT N/A 04/05/2015   Procedure: CORONARY ARTERY BYPASS GRAFTING (CABG)X4 LIMA-LAD; SVG-DIAG1-DIAG2; SVG-PD;  Surgeon: [name], MD;  Location: [facility];  Service: Open Heart Surgery;  Laterality: N/A;   CORONARY/GRAFT ANGIOGRAPHY N/A 04/22/2018   Procedure: CORONARY/GRAFT ANGIOGRAPHY;  Surgeon: Martinique, Peter M, MD;  Location: [facility] CV LAB;  Service: Cardiovascular;  Laterality: N/A;   CYSTOSCOPY     ENDARTERECTOMY Left 04/24/2016   Procedure: ENDARTERECTOMY LEFT CAROTID;  Surgeon: Mal Misty, MD;  Location: [facility];  Service: Vascular;  Laterality: Left;   EYE SURGERY     FEMORAL ARTERY - POPLITEAL ARTERY BYPASS GRAFT     ICD IMPLANT N/A 12/30/2020   Procedure: ICD IMPLANT;  Surgeon: Evans Lance, MD;  Location: [facility] CV LAB;  Service: Cardiovascular;  Laterality: N/A;   JOINT REPLACEMENT     shoulder   LEFT HEART CATHETERIZATION WITH CORONARY ANGIOGRAM N/A 04/03/2015    Procedure: LEFT HEART CATHETERIZATION WITH CORONARY ANGIOGRAM;  Surgeon: Troy Sine, MD;  Location: [facility] CATH LAB;  Service: Cardiovascular;  Laterality: N/A;   LUMBAR LAMINECTOMY  01/06/2013   Procedure: MICRODISCECTOMY LUMBAR LAMINECTOMY;  Surgeon: Marybelle Killings, MD;  Location: [facility];  Service: Orthopedics;  Laterality: N/A;  L3-4 decompression   LUMBAR LAMINECTOMY/DECOMPRESSION MICRODISCECTOMY  02/12/2012   Procedure: LUMBAR LAMINECTOMY/DECOMPRESSION MICRODISCECTOMY;  Surgeon: Floyce Stakes, MD;  Location: [facility] NEURO ORS;  Service: Neurosurgery;  Laterality: N/A;  Lumbar four-five laminectomy   PATCH ANGIOPLASTY Left 04/24/2016   Procedure: LEFT CAROTID ARTERY PATCH ANGIOPLASTY;  Surgeon: Mal Misty, MD;  Location: [facility];  Service: Vascular;  Laterality: Left;   RIGHT HEART CATH AND CORONARY/GRAFT ANGIOGRAPHY N/A 01/30/2019   Procedure: RIGHT HEART CATH AND CORONARY/GRAFT ANGIOGRAPHY;  Surgeon: Troy Sine, MD;  Location:  [facility] INVASIVE CV LAB;  Service: Cardiovascular;  Laterality: N/A;   STERIOD INJECTION Right 01/09/2014   Procedure: STEROID INJECTION;  Surgeon: Mcarthur Rossetti, MD;  Location: [facility];  Service: Orthopedics;  Laterality: Right;   TEE WITHOUT CARDIOVERSION N/A 04/05/2015   Procedure: TRANSESOPHAGEAL ECHOCARDIOGRAM (TEE);  Surgeon: [name], MD;  Location: [facility];  Service: Open Heart Surgery;  Laterality: N/A;   TOTAL HIP ARTHROPLASTY Left 01/09/2014   DR Ninfa Linden   TOTAL HIP ARTHROPLASTY Left 01/09/2014   Procedure: LEFT TOTAL HIP ARTHROPLASTY ANTERIOR APPROACH and Steroid Injection Right hip;  Surgeon: Mcarthur Rossetti, MD;  Location: [facility];  Service: Orthopedics;  Laterality: Left;   TOTAL HIP ARTHROPLASTY Right 08/15/2019   TOTAL HIP ARTHROPLASTY Right 08/15/2019   Procedure: RIGHT TOTAL HIP ARTHROPLASTY ANTERIOR APPROACH;  Surgeon: Mcarthur Rossetti, MD;  Location: [facility];  Service: Orthopedics;  Laterality: Right;       Family History   Problem Relation Age of Onset   Heart disease Father    Heart attack Father    Heart disease Sister    Hypertension Sister    Heart attack Sister    Hypertension Mother    Diabetes Son     Social History   Tobacco Use   Smoking status: Former    Types: Cigarettes    Quit date: 02/04/1987    Years since quitting: 34.7   Smokeless tobacco: Former    Types: Chew    Quit date: 07/20/2009   Tobacco comments:    quit 35+yrs ago  Vaping Use   Vaping Use: Never used  Substance Use Topics   Alcohol use: No    Alcohol/week: 0.0 standard drinks   Drug use: No    Home Medications Prior to Admission medications   Medication Sig Start Date End Date Taking? Authorizing Provider  allopurinol (ZYLOPRIM) 100 MG tablet Take 100 mg by mouth daily as needed (gout). 05/29/20   [provider]  ALPRAZolam Duanne Moron) 0.5 MG tablet Take 0.5 mg by mouth 3 (three) times daily as needed for anxiety or sleep. 10/07/21   [provider]  amiodarone (PACERONE) 200 MG tablet Take 1 tablet (200 mg total) by mouth daily. 09/11/21   Mercy Riding, MD  apixaban (ELIQUIS) 5 MG TABS tablet Take 1 tablet (5 mg total) by mouth 2 (two) times daily. 11/12/21 11/12/22  [name] T, PA-C  bisoprolol (ZEBETA) 5 MG tablet Take 0.5 tablets (2.5 mg total) by mouth daily. 11/12/21 11/12/22  [name] T, PA-C  empagliflozin (JARDIANCE) 10 MG TABS tablet Take 1 tablet (10 mg total) by mouth daily. 11/10/21   [name], [name], MD  finasteride (PROSCAR) 5 MG tablet Take 5 mg by mouth daily. 08/18/21   [provider]  furosemide (LASIX) 20 MG tablet Take 1 tablet (20 mg total) by mouth every other day. 11/12/21 05/11/22  [name] T, PA-C  levothyroxine (SYNTHROID) 25 MCG tablet Take 25 mcg by mouth daily. 07/02/21   [provider]  nitroGLYCERIN (NITROSTAT) 0.4 MG SL tablet Place 1 tablet (0.4 mg total) under the tongue every 5 (five) minutes as needed for chest pain. 07/09/21   Evans Lance, MD  ondansetron (ZOFRAN-ODT) 4 MG disintegrating tablet Take 4 mg by mouth every 6 (six) hours as needed for nausea/vomiting. 10/30/21   [provider]  pantoprazole (PROTONIX) 40 MG tablet Take 40 mg by mouth daily. 09/30/21   [provider]  pantoprazole (PROTONIX) 40 MG  tablet Take 1 tablet (40 mg total) by mouth daily. 11/12/21   [facility], Scott T, PA-C  REPATHA SURECLICK 338 MG/ML SOAJ INJECT 1 PEN INTO THE SKIN EVERY 14 DAYS. Patient taking differently: Inject 140 mg into the skin every 14 (fourteen) days. 09/16/21   [name], [name], MD  spironolactone (ALDACTONE) 25 MG tablet Take 0.5 tablets (12.5 mg total) by mouth daily. 10/16/21 10/11/22  Loel Dubonnet, NP  tamsulosin (FLOMAX) 0.4 MG CAPS capsule Take 0.4 mg by mouth at bedtime. 03/01/17   [provider]    Allergies    Zetia [ezetimibe], Ace inhibitors, Aspirin, Atorvastatin, Ezetimibe-simvastatin, Codeine, and Unithroid [levothyroxine sodium]  Review of Systems   Review of Systems  Constitutional:  Positive for fatigue. Negative for diaphoresis and fever.  Eyes:  Negative for redness.  Respiratory:  Positive for shortness of breath. Negative for cough.   Cardiovascular:  Positive for chest pain. Negative for palpitations and leg swelling.  Gastrointestinal:  Positive for nausea. Negative for abdominal pain and vomiting.  Genitourinary:  Negative for dysuria.  Musculoskeletal:  Positive for back pain. Negative for neck pain.  Skin:  Negative for rash.  Neurological:  Negative for syncope and light-headedness.  Psychiatric/Behavioral:  The patient is not nervous/anxious.    Physical Exam Updated Vital Signs BP (!) 163/87 (BP Location: Left Arm)   Pulse 81   Temp 98.3 F (36.8 C)   Resp 16   SpO2 97%   Physical Exam Vitals and nursing note reviewed.  Constitutional:      General: He is not in acute distress.    Appearance: He is well-developed.  HENT:     Head: Normocephalic  and atraumatic.  Eyes:     General:        Right eye: No discharge.        Left eye: No discharge.     Conjunctiva/sclera: Conjunctivae normal.  Cardiovascular:     Rate and Rhythm: Normal rate and regular rhythm.     Heart sounds: Murmur heard.  Systolic murmur is present.  Pulmonary:     Effort: Pulmonary effort is normal.     Breath sounds: Normal breath sounds. No decreased breath sounds, wheezing, rhonchi or rales.  Chest:     Chest wall: Tenderness present.     Comments: Patient with tenderness on the chest wall left of the sternum.  Reproduces patient's chest pain. Abdominal:     Palpations: Abdomen is soft.     Tenderness: There is no abdominal tenderness.  Musculoskeletal:     Cervical back: Normal range of motion and neck supple.     Right lower leg: No edema.     Left lower leg: No edema.  Skin:    General: Skin is warm and dry.  Neurological:     Mental Status: He is alert.    ED Results / Procedures / Treatments   Labs (all labs ordered are listed, but only abnormal results are displayed) Labs Reviewed  BASIC METABOLIC PANEL - Abnormal; Notable for the following components:      Result Value   Glucose, Bld 102 (*)    All other components within normal limits  BRAIN NATRIURETIC PEPTIDE - Abnormal; Notable for the following components:   B Natriuretic Peptide 787.3 (*)    All other components within normal limits  HEPATIC FUNCTION PANEL - Abnormal; Notable for the following components:   Bilirubin, Direct 0.3 (*)    All other components within normal limits  TROPONIN I (  HIGH SENSITIVITY) - Abnormal; Notable for the following components:   Troponin I (High Sensitivity) 40 (*)    All other components within normal limits  TROPONIN I (HIGH SENSITIVITY) - Abnormal; Notable for the following components:   Troponin I (High Sensitivity) 39 (*)    All other components within normal limits  CBC  LIPASE, BLOOD    ED ECG REPORT   Date: 11/13/2021  Rate: 81   Rhythm: atrial fibrillation  QRS Axis: normal  Intervals: QT prolonged  ST/T Wave abnormalities: normal  Conduction Disutrbances:none  Narrative Interpretation:   Old EKG Reviewed: unchanged  I have personally reviewed the EKG tracing and agree with the computerized printout as noted.   Radiology DG Chest 2 View  Result Date: 11/13/2021 CLINICAL DATA:  Chest pain EXAM: CHEST - 2 VIEW COMPARISON:  Chest x-ray 10/27/2021 FINDINGS: Heart is mildly enlarged. Mediastinum appears stable. Cardiac surgical changes and median sternotomy wires. Left-sided cardiac pacemaker device. Pulmonary vasculature is normal. No focal consolidation identified. No pleural effusion or pneumothorax. IMPRESSION: Cardiomegaly with no acute process identified. Electronically Signed   By: Ofilia Neas M.D.   On: 11/13/2021 15:22    Procedures Procedures   Medications Ordered in ED Medications  metoCLOPramide (REGLAN) injection 5 mg (5 mg Intravenous Given 11/13/21 1514)    ED Course  I have reviewed the triage vital signs and the nursing notes.  Pertinent labs & imaging results that were available during my care of the patient were reviewed by me and considered in my medical decision making (see chart for details).  Patient seen and examined. Plan discussed with patient.   Labs: CBC, CMP, lipase, BNP, EKG  Imaging: Chest x-ray  Medications/Fluids: Reglan 5 mg for severe nausea, prolonged QT noted.  In addition, will ask that ICD be interrogated  Vital signs reviewed and are as follows: BP (!) 163/87 (BP Location: Left Arm)   Pulse 81   Temp 98.3 F (36.8 C)   Resp 16   SpO2 97%   About 30 minutes after receiving Reglan, patient reports feeling much better.  Initial work-up reassuring.  Troponin slightly elevated but in line with previous level.  Awaiting second troponin.  6:42 PM patient continues to feel well.  Troponin flat.   At this point he is requesting discharge to home.  We did  send defibrillator transmission, however this will go to his primary cardiologist.  He has been in rate controlled A. fib his entire ED visit.  Plan discharge with Reglan to use for nausea.  Patient states that he was feeling dizzy and this was also improved.  Question element of vertigo.  He will continue MiraLAX daily for constipation.  Plan PCP/cardiology follow-up.  Encouraged return with chest pain, shortness of breath, strokelike symptoms, other concerns.    MDM Rules/Calculators/A&P                           Patient presents today feeling generally unwell.  He has some chest pain that is reproducible with palpation and is likely chest wall pain.  Troponin elevated but flat consistent with previous.  EKG without concerning findings.  He has persistent A. fib.  Otherwise labs are reassuring.  He looks well compensated from a CHF standpoint and appears euvolemic.  He has had several medicines adjusted by cardiology yesterday.  Given that patient is clinically improved, will discharged home.  He is in agreement with this plan.  Low concern for ACS,  stroke.    Final Clinical Impression(s) / ED Diagnoses Final diagnoses:  Nausea  Constipation, unspecified constipation type  Precordial pain    Rx / DC Orders ED Discharge Orders          Ordered    metoCLOPramide (REGLAN) 5 MG tablet  Every 6 hours PRN        11/13/21 1822             Carlisle Cater, PA-C 11/13/21 1847    Truddie Hidden, MD 11/13/21 [phone]

## 2021-11-17 ENCOUNTER — Inpatient Hospital Stay (HOSPITAL_BASED_OUTPATIENT_CLINIC_OR_DEPARTMENT_OTHER)
Admission: EM | Admit: 2021-11-17 | Discharge: 2021-11-19 | DRG: 309 | Disposition: A | Discharge status: Home / Self Care | Payer: Medicare Other | Attending: Internal Medicine | Admitting: Internal Medicine

## 2021-11-17 ENCOUNTER — Other Ambulatory Visit: Payer: Self-pay

## 2021-11-17 ENCOUNTER — Emergency Department (HOSPITAL_BASED_OUTPATIENT_CLINIC_OR_DEPARTMENT_OTHER): Payer: Medicare Other | Admitting: Radiology

## 2021-11-17 ENCOUNTER — Encounter (HOSPITAL_BASED_OUTPATIENT_CLINIC_OR_DEPARTMENT_OTHER): Payer: Self-pay | Admitting: Emergency Medicine

## 2021-11-17 DIAGNOSIS — Z79890 Hormone replacement therapy (postmenopausal): Secondary | ICD-10-CM

## 2021-11-17 DIAGNOSIS — Z8616 Personal history of COVID-19: Secondary | ICD-10-CM

## 2021-11-17 DIAGNOSIS — Z20822 Contact with and (suspected) exposure to covid-19: Secondary | ICD-10-CM | POA: Diagnosis not present

## 2021-11-17 DIAGNOSIS — I4892 Unspecified atrial flutter: Secondary | ICD-10-CM | POA: Diagnosis present

## 2021-11-17 DIAGNOSIS — I4819 Other persistent atrial fibrillation: Secondary | ICD-10-CM | POA: Diagnosis present

## 2021-11-17 DIAGNOSIS — I4729 Other ventricular tachycardia: Secondary | ICD-10-CM | POA: Diagnosis present

## 2021-11-17 DIAGNOSIS — Z87442 Personal history of urinary calculi: Secondary | ICD-10-CM

## 2021-11-17 DIAGNOSIS — I5032 Chronic diastolic (congestive) heart failure: Secondary | ICD-10-CM | POA: Diagnosis not present

## 2021-11-17 DIAGNOSIS — Z95820 Peripheral vascular angioplasty status with implants and grafts: Secondary | ICD-10-CM

## 2021-11-17 DIAGNOSIS — R079 Chest pain, unspecified: Secondary | ICD-10-CM | POA: Diagnosis present

## 2021-11-17 DIAGNOSIS — Z8679 Personal history of other diseases of the circulatory system: Secondary | ICD-10-CM

## 2021-11-17 DIAGNOSIS — I472 Ventricular tachycardia, unspecified: Secondary | ICD-10-CM | POA: Diagnosis not present

## 2021-11-17 DIAGNOSIS — N401 Enlarged prostate with lower urinary tract symptoms: Secondary | ICD-10-CM | POA: Diagnosis present

## 2021-11-17 DIAGNOSIS — Z96612 Presence of left artificial shoulder joint: Secondary | ICD-10-CM | POA: Diagnosis present

## 2021-11-17 DIAGNOSIS — I130 Hypertensive heart and chronic kidney disease with heart failure and stage 1 through stage 4 chronic kidney disease, or unspecified chronic kidney disease: Secondary | ICD-10-CM | POA: Diagnosis present

## 2021-11-17 DIAGNOSIS — Z885 Allergy status to narcotic agent status: Secondary | ICD-10-CM

## 2021-11-17 DIAGNOSIS — R338 Other retention of urine: Secondary | ICD-10-CM | POA: Diagnosis present

## 2021-11-17 DIAGNOSIS — I70209 Unspecified atherosclerosis of native arteries of extremities, unspecified extremity: Secondary | ICD-10-CM | POA: Diagnosis present

## 2021-11-17 DIAGNOSIS — R0789 Other chest pain: Secondary | ICD-10-CM | POA: Diagnosis not present

## 2021-11-17 DIAGNOSIS — I779 Disorder of arteries and arterioles, unspecified: Secondary | ICD-10-CM | POA: Diagnosis present

## 2021-11-17 DIAGNOSIS — E785 Hyperlipidemia, unspecified: Secondary | ICD-10-CM | POA: Diagnosis present

## 2021-11-17 DIAGNOSIS — I700 Atherosclerosis of aorta: Secondary | ICD-10-CM | POA: Diagnosis present

## 2021-11-17 DIAGNOSIS — F419 Anxiety disorder, unspecified: Secondary | ICD-10-CM | POA: Diagnosis present

## 2021-11-17 DIAGNOSIS — Z87891 Personal history of nicotine dependence: Secondary | ICD-10-CM

## 2021-11-17 DIAGNOSIS — Z96643 Presence of artificial hip joint, bilateral: Secondary | ICD-10-CM | POA: Diagnosis present

## 2021-11-17 DIAGNOSIS — I10 Essential (primary) hypertension: Secondary | ICD-10-CM | POA: Diagnosis present

## 2021-11-17 DIAGNOSIS — R0602 Shortness of breath: Secondary | ICD-10-CM | POA: Diagnosis not present

## 2021-11-17 DIAGNOSIS — Z86718 Personal history of other venous thrombosis and embolism: Secondary | ICD-10-CM

## 2021-11-17 DIAGNOSIS — N1831 Chronic kidney disease, stage 3a: Secondary | ICD-10-CM | POA: Diagnosis present

## 2021-11-17 DIAGNOSIS — M1990 Unspecified osteoarthritis, unspecified site: Secondary | ICD-10-CM | POA: Diagnosis present

## 2021-11-17 DIAGNOSIS — E039 Hypothyroidism, unspecified: Secondary | ICD-10-CM | POA: Diagnosis present

## 2021-11-17 DIAGNOSIS — I252 Old myocardial infarction: Secondary | ICD-10-CM

## 2021-11-17 DIAGNOSIS — Z7984 Long term (current) use of oral hypoglycemic drugs: Secondary | ICD-10-CM

## 2021-11-17 DIAGNOSIS — K5909 Other constipation: Secondary | ICD-10-CM | POA: Diagnosis present

## 2021-11-17 DIAGNOSIS — Z886 Allergy status to analgesic agent status: Secondary | ICD-10-CM

## 2021-11-17 DIAGNOSIS — Z95810 Presence of automatic (implantable) cardiac defibrillator: Secondary | ICD-10-CM

## 2021-11-17 DIAGNOSIS — M109 Gout, unspecified: Secondary | ICD-10-CM | POA: Diagnosis present

## 2021-11-17 DIAGNOSIS — K30 Functional dyspepsia: Secondary | ICD-10-CM | POA: Diagnosis present

## 2021-11-17 DIAGNOSIS — D6859 Other primary thrombophilia: Secondary | ICD-10-CM | POA: Diagnosis not present

## 2021-11-17 DIAGNOSIS — I739 Peripheral vascular disease, unspecified: Secondary | ICD-10-CM | POA: Diagnosis present

## 2021-11-17 DIAGNOSIS — Z7901 Long term (current) use of anticoagulants: Secondary | ICD-10-CM

## 2021-11-17 DIAGNOSIS — G8929 Other chronic pain: Secondary | ICD-10-CM | POA: Diagnosis present

## 2021-11-17 DIAGNOSIS — Z79899 Other long term (current) drug therapy: Secondary | ICD-10-CM

## 2021-11-17 DIAGNOSIS — Z86711 Personal history of pulmonary embolism: Secondary | ICD-10-CM

## 2021-11-17 DIAGNOSIS — R7303 Prediabetes: Secondary | ICD-10-CM | POA: Diagnosis present

## 2021-11-17 DIAGNOSIS — Z833 Family history of diabetes mellitus: Secondary | ICD-10-CM

## 2021-11-17 DIAGNOSIS — I2510 Atherosclerotic heart disease of native coronary artery without angina pectoris: Secondary | ICD-10-CM | POA: Diagnosis present

## 2021-11-17 DIAGNOSIS — K219 Gastro-esophageal reflux disease without esophagitis: Secondary | ICD-10-CM | POA: Diagnosis present

## 2021-11-17 DIAGNOSIS — Z8673 Personal history of transient ischemic attack (TIA), and cerebral infarction without residual deficits: Secondary | ICD-10-CM

## 2021-11-17 DIAGNOSIS — M549 Dorsalgia, unspecified: Secondary | ICD-10-CM | POA: Diagnosis present

## 2021-11-17 DIAGNOSIS — Z888 Allergy status to other drugs, medicaments and biological substances status: Secondary | ICD-10-CM

## 2021-11-17 DIAGNOSIS — R17 Unspecified jaundice: Secondary | ICD-10-CM | POA: Diagnosis present

## 2021-11-17 DIAGNOSIS — I350 Nonrheumatic aortic (valve) stenosis: Secondary | ICD-10-CM | POA: Diagnosis present

## 2021-11-17 DIAGNOSIS — Z8249 Family history of ischemic heart disease and other diseases of the circulatory system: Secondary | ICD-10-CM

## 2021-11-17 DIAGNOSIS — Z8719 Personal history of other diseases of the digestive system: Secondary | ICD-10-CM

## 2021-11-17 DIAGNOSIS — Z951 Presence of aortocoronary bypass graft: Secondary | ICD-10-CM

## 2021-11-17 LAB — CBC
HCT: 44.3 % (ref 39.0–52.0)
Hemoglobin: 14.4 g/dL (ref 13.0–17.0)
MCH: 32.2 pg (ref 26.0–34.0)
MCHC: 32.5 g/dL (ref 30.0–36.0)
MCV: 99.1 fL (ref 80.0–100.0)
Platelets: 246 10*3/uL (ref 150–400)
RBC: 4.47 MIL/uL (ref 4.22–5.81)
RDW: 13.7 % (ref 11.5–15.5)
WBC: 10.9 10*3/uL — ABNORMAL HIGH (ref 4.0–10.5)
nRBC: 0 % (ref 0.0–0.2)

## 2021-11-17 LAB — PROTIME-INR
INR: 1.2 (ref 0.8–1.2)
Prothrombin Time: 15 seconds (ref 11.4–15.2)

## 2021-11-17 LAB — BASIC METABOLIC PANEL
Anion gap: 10 (ref 5–15)
BUN: 22 mg/dL (ref 8–23)
CO2: 26 mmol/L (ref 22–32)
Calcium: 9.1 mg/dL (ref 8.9–10.3)
Chloride: 105 mmol/L (ref 98–111)
Creatinine, Ser: 1.25 mg/dL — ABNORMAL HIGH (ref 0.61–1.24)
GFR, Estimated: 58 mL/min — ABNORMAL LOW (ref >60)
Glucose, Bld: 150 mg/dL — ABNORMAL HIGH (ref 70–99)
Potassium: 4.4 mmol/L (ref 3.5–5.1)
Sodium: 141 mmol/L (ref 135–145)

## 2021-11-17 LAB — RESP PANEL BY RT-PCR (FLU A&B, COVID) ARPGX2
Influenza A by PCR: NEGATIVE
Influenza B by PCR: NEGATIVE
SARS Coronavirus 2 by RT PCR: NEGATIVE

## 2021-11-17 LAB — TROPONIN I (HIGH SENSITIVITY)
Troponin I (High Sensitivity): 41 ng/L — ABNORMAL HIGH (ref <18)
Troponin I (High Sensitivity): 47 ng/L — ABNORMAL HIGH (ref <18)

## 2021-11-17 MED ORDER — ENOXAPARIN SODIUM 40 MG/0.4ML IJ SOSY
40.0000 mg | PREFILLED_SYRINGE | Freq: Every day | INTRAMUSCULAR | Status: DC
  Administered 2021-11-18: 40 mg via SUBCUTANEOUS

## 2021-11-17 MED ORDER — ACETAMINOPHEN 325 MG PO TABS: 650.0000 mg | ORAL_TABLET | Freq: Four times a day (QID) | ORAL | Status: DC | PRN

## 2021-11-17 MED ORDER — ACETAMINOPHEN 650 MG RE SUPP: 650.0000 mg | Freq: Four times a day (QID) | RECTAL | Status: DC | PRN

## 2021-11-17 NOTE — Progress Notes (Signed)
Received pt from [facility] via care[facility] to 2c01.  Pt alert and oriented x4.  Denies cp or SOB at this time.  Pt placed in bed and oriented to room.  Monitor placed on pt.  Pt in Afib, controlled rate.  Pt states his wife knows he was coming to [facility] and he would call her with his room number.  [facility] notified of pt arrival.  VSS at this time, bp elevated.  Pt does endorse that he will need order for I&O cath as he sometimes has to do it at home and always needs it when he is hospitalized.

## 2021-11-17 NOTE — ED Triage Notes (Signed)
Pt via pov from home with sob and cp today. This is the same pain he had when he was seen last week, which resolved. Pt states his heart feels like it is "shivering" and that he has trouble catching his breath. Pt has hx of MI and CABG 3-4 years ago. Pt alert & oriented, nad noted.

## 2021-11-17 NOTE — H&P (Signed)
History and Physical    Ryan [name]:[id number] DOB: 09-24-1941 DOA: 11/17/2021  PCP: Ginger Organ., MD Patient coming from: Home  Chief Complaint: Chest pain  HPI: Ryan [name] is a 80 y.o. male with medical history significant of CAD status post CABG in 9485, chronic diastolic CHF, moderate aortic stenosis, persistent A. fib on Eliquis, VT status post ICD, prolonged QT, history of DVT/PE, CKD stage IIIa, PAD status post Ao-Bifem bypass, carotid artery disease status post L CEA, history of CVA, aortic atherosclerosis, BPH, hypertension, hyperlipidemia, hypothyroidism, diagnosed with COVID at the beginning of this month and treated with Paxlovid, subsequently admitted and treated with antibiotics for right lower lobe pneumonia.  Seen by cardiology 5 days ago and carvedilol discontinued.  He was switched to bisoprolol and dose of Lasix reduced due to concern for hypotension.  Seen in the ED 4 days ago for feeling unwell and work-up did not show any acute finding.  He presents to the ED today complaining of chest pain, palpitations, and near syncope.  He was told that his defibrillator fired the other night when he was asleep when he had an episode of VT.  EKG showing A. fib without evidence of STEMI, prolonged QT.  High-sensitivity troponin 41 > 47.  Chest x-ray showing no acute finding.  COVID and influenza PCR negative.  Labs showing borderline leukocytosis but no anemia.  Patient did not have the box to transmit his ICD as it was at home.  ED physician discussed the case with on-call cardiologist who recommended admission for telemetry monitoring, EP will see him in the morning.   Patient states this morning he woke up feeling short of breath and his heart was "trembling."  He did not have any chest pain at that time.  He rested and symptoms resolved but then he had a similar episode again later on in the afternoon which prompted him to come into the emergency room to be evaluated.   He does report intermittent brief episodes of left-sided, nonexertional chest pain this past week.  No chest pain at present.  States his cardiology office called him twice in the past 4 days to inform him that his ICD fired but he did not feel any shocks.  Patient is concerned that his symptoms are due to a gastrointestinal cause.  States he was constipated last week and took an over-the-counter laxative on Friday after which he had bowel movements over the weekend and felt good.  However, he is now constipated again.  He has felt nauseous but has not vomited.  Denies abdominal pain.  Patient is requesting catheterization stating he is not able to urinate whenever he is in the hospital.  States this is a chronic issue and he has to do intermittent self-catheterization at home.  Review of Systems:  All systems reviewed and apart from history of presenting illness, are negative.  Past Medical History:  Diagnosis Date   Acquired thrombophilia ([place]) 11/01/2021   Arthritis    CAD (coronary artery disease)    Carotid artery occlusion    Cataract    Bil eyes/worse in left eye   CHF (congestive heart failure) (HCC)    Chronic back pain    COVID-19 10/31/2021   DVT (deep venous thrombosis) (HCC)    Dysrhythmia    Enlarged prostate    takes Rapaflo daily   GERD (gastroesophageal reflux disease)    occasional   History of colon polyps    History of gout  has colchicine prn   History of kidney stones    Hyperlipidemia    takes Crestor daily   Hypertension    takes Amlodipine daily   Hypothyroidism    Myocardial infarction [facility])    Peripheral vascular disease (HCC)    Prolonged QT interval 11/01/2021   Pulmonary emboli ([place]) 03/20/2015   elevated d-dimer, intermediate V/Q study, atypical chest pain and SOB. Start on Xarelto 20mg  BID for 3 month   Rapid atrial fibrillation ([place])    Renal insufficiency    Shortness of breath dyspnea    Urinary frequency    Urinary urgency     Past  Surgical History:  Procedure Laterality Date   APPENDECTOMY     BACK SURGERY     5 times   big toe surgery     CARDIAC CATHETERIZATION     2010    dr Acie Fredrickson   cataract surgery     left eye   CHOLECYSTECTOMY N/A 07/27/2016   Procedure: LAPAROSCOPIC CHOLECYSTECTOMY;  Surgeon: Mickeal Skinner, MD;  Location: [place];  Service: General;  Laterality: N/A;   COLONOSCOPY     CORONARY ARTERY BYPASS GRAFT N/A 04/05/2015   Procedure: CORONARY ARTERY BYPASS GRAFTING (CABG)X4 LIMA-LAD; SVG-DIAG1-DIAG2; SVG-PD;  Surgeon: [name], MD;  Location: [place];  Service: Open Heart Surgery;  Laterality: N/A;   CORONARY/GRAFT ANGIOGRAPHY N/A 04/22/2018   Procedure: CORONARY/GRAFT ANGIOGRAPHY;  Surgeon: Martinique, Peter M, MD;  Location: [place] [facility];  Service: Cardiovascular;  Laterality: N/A;   CYSTOSCOPY     ENDARTERECTOMY Left 04/24/2016   Procedure: ENDARTERECTOMY LEFT CAROTID;  Surgeon: Mal Misty, MD;  Location: [place];  Service: Vascular;  Laterality: Left;   EYE SURGERY     FEMORAL ARTERY - POPLITEAL ARTERY BYPASS GRAFT     ICD IMPLANT N/A 12/30/2020   Procedure: ICD IMPLANT;  Surgeon: Evans Lance, MD;  Location: [place] CV [facility];  Service: Cardiovascular;  Laterality: N/A;   JOINT REPLACEMENT     shoulder   LEFT HEART CATHETERIZATION WITH CORONARY ANGIOGRAM N/A 04/03/2015   Procedure: LEFT HEART CATHETERIZATION WITH CORONARY ANGIOGRAM;  Surgeon: Troy Sine, MD;  Location: [facility] CATH LAB;  Service: Cardiovascular;  Laterality: N/A;   LUMBAR LAMINECTOMY  01/06/2013   Procedure: MICRODISCECTOMY LUMBAR LAMINECTOMY;  Surgeon: Marybelle Killings, MD;  Location: [place];  Service: Orthopedics;  Laterality: N/A;  L3-4 decompression   LUMBAR LAMINECTOMY/DECOMPRESSION MICRODISCECTOMY  02/12/2012   Procedure: LUMBAR LAMINECTOMY/DECOMPRESSION MICRODISCECTOMY;  Surgeon: Floyce Stakes, MD;  Location: [place] [facility];  Service: Neurosurgery;  Laterality: N/A;  Lumbar four-five laminectomy   PATCH  ANGIOPLASTY Left 04/24/2016   Procedure: LEFT CAROTID ARTERY PATCH ANGIOPLASTY;  Surgeon: Mal Misty, MD;  Location: [place];  Service: Vascular;  Laterality: Left;   RIGHT HEART CATH AND CORONARY/GRAFT ANGIOGRAPHY N/A 01/30/2019   Procedure: RIGHT HEART CATH AND CORONARY/GRAFT ANGIOGRAPHY;  Surgeon: Troy Sine, MD;  Location: [place] CV [facility];  Service: Cardiovascular;  Laterality: N/A;   STERIOD INJECTION Right 01/09/2014   Procedure: STEROID INJECTION;  Surgeon: Mcarthur Rossetti, MD;  Location: [place];  Service: Orthopedics;  Laterality: Right;   TEE WITHOUT CARDIOVERSION N/A 04/05/2015   Procedure: TRANSESOPHAGEAL ECHOCARDIOGRAM (TEE);  Surgeon: [name], MD;  Location: [place];  Service: Open Heart Surgery;  Laterality: N/A;   TOTAL HIP ARTHROPLASTY Left 01/09/2014   DR [facility]   TOTAL HIP ARTHROPLASTY Left 01/09/2014   Procedure: LEFT TOTAL HIP ARTHROPLASTY ANTERIOR  APPROACH and Steroid Injection Right hip;  Surgeon: Mcarthur Rossetti, MD;  Location: [place];  Service: Orthopedics;  Laterality: Left;   TOTAL HIP ARTHROPLASTY Right 08/15/2019   TOTAL HIP ARTHROPLASTY Right 08/15/2019   Procedure: RIGHT TOTAL HIP ARTHROPLASTY ANTERIOR APPROACH;  Surgeon: Mcarthur Rossetti, MD;  Location: [place];  Service: Orthopedics;  Laterality: Right;     reports that he quit smoking about 34 years ago. His smoking use included cigarettes. He quit smokeless tobacco use about 12 years ago.  His smokeless tobacco use included chew. He reports that he does not drink alcohol and does not use drugs.  Allergies  Allergen Reactions   Zetia [Ezetimibe] Other (See Comments)    Myalgias    Ace Inhibitors Other (See Comments)   Aspirin Other (See Comments)   Atorvastatin Other (See Comments)   Ezetimibe-Simvastatin Other (See Comments)   Codeine Nausea And Vomiting        Unithroid [Levothyroxine Sodium] Other (See Comments)    Caused blurry vision per pt    Family History   Problem Relation Age of Onset   Heart disease Father    Heart attack Father    Heart disease Sister    Hypertension Sister    Heart attack Sister    Hypertension Mother    Diabetes Son     Prior to Admission medications   Medication Sig Start Date End Date Taking? Authorizing Provider  allopurinol (ZYLOPRIM) 100 MG tablet Take 100 mg by mouth daily as needed (gout). 05/29/20   [provider]  ALPRAZolam Duanne Moron) 0.5 MG tablet Take 0.5 mg by mouth 3 (three) times daily as needed for anxiety or sleep. 10/07/21   [provider]  amiodarone (PACERONE) 200 MG tablet Take 1 tablet (200 mg total) by mouth daily. 09/11/21   Mercy Riding, MD  apixaban (ELIQUIS) 5 MG TABS tablet Take 1 tablet (5 mg total) by mouth 2 (two) times daily. 11/12/21 11/12/22  [name] T, PA-C  bisoprolol (ZEBETA) 5 MG tablet Take 0.5 tablets (2.5 mg total) by mouth daily. 11/12/21 11/12/22  [name] T, PA-C  empagliflozin (JARDIANCE) 10 MG TABS tablet Take 1 tablet (10 mg total) by mouth daily. 11/10/21   [name], [name], MD  finasteride (PROSCAR) 5 MG tablet Take 5 mg by mouth daily. 08/18/21   [provider]  furosemide (LASIX) 20 MG tablet Take 1 tablet (20 mg total) by mouth every other day. 11/12/21 05/11/22  [name] T, PA-C  levothyroxine (SYNTHROID) 25 MCG tablet Take 25 mcg by mouth daily. 07/02/21   [provider]  metoCLOPramide (REGLAN) 5 MG tablet Take 1 tablet (5 mg total) by mouth every 6 (six) hours as needed for nausea. 11/13/21   Carlisle Cater, PA-C  nitroGLYCERIN (NITROSTAT) 0.4 MG SL tablet Place 1 tablet (0.4 mg total) under the tongue every 5 (five) minutes as needed for chest pain. 07/09/21   Evans Lance, MD  ondansetron (ZOFRAN-ODT) 4 MG disintegrating tablet Take 4 mg by mouth every 6 (six) hours as needed for nausea/vomiting. 10/30/21   [provider]  pantoprazole (PROTONIX) 40 MG tablet Take 40 mg by mouth daily. 09/30/21    [provider]  pantoprazole (PROTONIX) 40 MG tablet Take 1 tablet (40 mg total) by mouth daily. 11/12/21   Weaver, Scott T, PA-C  REPATHA SURECLICK 782 MG/ML SOAJ INJECT 1 PEN INTO THE SKIN EVERY 14 DAYS. Patient taking differently: Inject 140 mg into the skin every 14 (  fourteen) days. 09/16/21   [name], [name], MD  spironolactone (ALDACTONE) 25 MG tablet Take 0.5 tablets (12.5 mg total) by mouth daily. 10/16/21 10/11/22  Loel Dubonnet, NP  tamsulosin (FLOMAX) 0.4 MG CAPS capsule Take 0.4 mg by mouth at bedtime. 03/01/17   [provider]    Physical Exam: Vitals:   11/17/21 1700 11/17/21 1848 11/17/21 1905 11/17/21 2229  BP: (!) 155/76  (!) 161/68 (!) 189/75  Pulse: 69 69 64 64  Resp: 15 15 15 18   Temp:    97.7 F (36.5 C)  TempSrc:    Oral  SpO2: 100% 97% 97% 95%  Weight:    66.3 kg  Height:    5\' 6"  (1.676 m)    Physical Exam Constitutional:      General: He is not in acute distress. HENT:     Head: Normocephalic and atraumatic.  Eyes:     Extraocular Movements: Extraocular movements intact.     Conjunctiva/sclera: Conjunctivae normal.  Cardiovascular:     Rate and Rhythm: Normal rate and regular rhythm.     Pulses: Normal pulses.  Pulmonary:     Effort: Pulmonary effort is normal. No respiratory distress.     Breath sounds: Normal breath sounds. No wheezing or rales.  Abdominal:     General: Bowel sounds are normal. There is no distension.     Palpations: Abdomen is soft.     Tenderness: There is no abdominal tenderness.  Musculoskeletal:        General: No swelling or tenderness.     Cervical back: Normal range of motion and neck supple.  Skin:    General: Skin is warm and dry.  Neurological:     General: No focal deficit present.     Mental Status: He is alert and oriented to person, place, and time.     Labs on Admission: I have personally reviewed following labs and imaging studies  CBC: Recent Labs  Lab 11/13/21 1443  11/17/21 1358  WBC 8.8 10.9*  HGB 14.9 14.4  HCT 45.6 44.3  MCV 97.2 99.1  PLT 275 616   Basic Metabolic Panel: Recent Labs  Lab 11/13/21 1443 11/17/21 1358  NA 138 141  K 4.4 4.4  CL 102 105  CO2 27 26  GLUCOSE 102* 150*  BUN 16 22  CREATININE 1.20 1.25*  CALCIUM 9.7 9.1   GFR: Estimated Creatinine Clearance: 42.5 mL/min (A) (by C-G formula based on SCr of 1.25 mg/dL (H)). Liver Function Tests: Recent Labs  Lab 11/13/21 1443  AST 25  ALT 14  ALKPHOS 53  BILITOT 1.2  PROT 6.9  ALBUMIN 4.0   Recent Labs  Lab 11/13/21 1443  LIPASE 21   No results for input(s): AMMONIA in the last 168 hours. Coagulation Profile: Recent Labs  Lab 11/17/21 1358  INR 1.2   Cardiac Enzymes: No results for input(s): CKTOTAL, CKMB, CKMBINDEX, TROPONINI in the last 168 hours. BNP (last 3 results) No results for input(s): PROBNP in the last 8760 hours. HbA1C: No results for input(s): HGBA1C in the last 72 hours. CBG: No results for input(s): GLUCAP in the last 168 hours. Lipid Profile: No results for input(s): CHOL, HDL, LDLCALC, TRIG, CHOLHDL, LDLDIRECT in the last 72 hours. Thyroid Function Tests: No results for input(s): TSH, T4TOTAL, FREET4, T3FREE, THYROIDAB in the last 72 hours. Anemia Panel: No results for input(s): VITAMINB12, FOLATE, FERRITIN, TIBC, IRON, RETICCTPCT in the last 72 hours. Urine analysis:    Component Value Date/Time  Kenneth YELLOW 10/31/2021 2008   APPEARANCEUR CLEAR 10/31/2021 2008   LABSPEC 1.045 (H) 10/31/2021 2008   PHURINE 7.0 10/31/2021 2008   GLUCOSEU >1,000 (A) 10/31/2021 2008   HGBUR NEGATIVE 10/31/2021 2008   BILIRUBINUR NEGATIVE 10/31/2021 2008   KETONESUR 15 (A) 10/31/2021 2008   PROTEINUR TRACE (A) 10/31/2021 2008   UROBILINOGEN 1.0 04/04/2015 2320   NITRITE NEGATIVE 10/31/2021 2008   LEUKOCYTESUR NEGATIVE 10/31/2021 2008    Radiological Exams on Admission: DG Chest 2 View  Result Date: 11/17/2021 CLINICAL DATA:   Shortness of breath.  Chest pain. EXAM: CHEST - 2 VIEW COMPARISON:  Two-view chest x-ray 11/13/2021 FINDINGS: Heart size is upper limits of normal. Atherosclerotic changes are noted at the aortic arch. Patient is status post median sternotomy for CABG. AICD in place. Lungs are clear. Left shoulder hemiarthroplasty noted. Surgical clips are present in the upper abdomen. IMPRESSION: No acute cardiopulmonary disease. Electronically Signed   By: San Morelle M.D.   On: 11/17/2021 14:40    EKG: Independently reviewed.  Rate controlled A. fib, QTC 508.  No STEMI.  Assessment/Plan Principal Problem:   Chest pain Active Problems:   Coronary artery disease   HTN (hypertension)   Carotid artery disease (HCC)   PVD (peripheral vascular disease) (HCC)   Chest pain, SOB, palpitations History concerning for VT. Patient states he was told by his cardiologist's office that his ICD fired twice in the past 4 days. Currently asymptomatic. EKG showing rate controlled A. fib without evidence of STEMI, prolonged QT.  High-sensitivity troponin 41 > 47.  Chest x-ray showing no acute finding.  COVID and influenza PCR negative. Labs showing borderline leukocytosis but no anemia.  Patient is currently asymptomatic. -Cardiac monitoring.  Trend troponin.  Needs ICD interrogation. I spoke to on call cardioogust (Dr. Marcelle Smiling), EP will see the patient in the morning.  Hypertension Blood pressure elevated. -IV hydralazine prn.  Resume home meds after pharmacy med rec is done.  CKD stage IIIa -Creatinine 1.2, stable.  Constipation No vomiting, abdominal pain/tenderness, or abdominal distention. -MiraLAX prn  Urinary retention -In-N-Out cath, bladder scans every 4 hours  CAD status post CABG Chronic diastolic CHF: Appears euvolemic on exam. Persistent A. fib: Rate controlled. History of DVT/PE BPH Hyperlipidemia Hypothyroidism PAD Carotid artery disease History of CVA -Pharmacy med rec pending.  DVT  prophylaxis: Lovenox Code Status: Full code-discussed with the patient. Family Communication: No family available at this time. Disposition Plan: Status is: Observation  The patient remains OBS appropriate and will d/c before 2 midnights.  Level of care: Level of care: Progressive  The medical decision making on this patient was of high complexity and the patient is at high risk for clinical deterioration, therefore this is a level 3 visit.  Shela Leff MD [facility]  If 7PM-7AM, please contact night-coverage [email or web address]  11/18/2021, 12:30 AM

## 2021-11-17 NOTE — ED Provider Notes (Signed)
[facility] EMERGENCY DEPT Provider Note   CSN: [id number] Arrival date & time: 11/17/21  1338     History Chief Complaint  Patient presents with   Shortness of Breath    Ryan [name] is a 80 y.o. male.  The history is provided by the patient, the spouse and medical records. No language interpreter was used.  Chest Pain Pain location:  Substernal area and L chest Pain quality: aching, crushing, dull and pressure   Pain severity:  Severe Onset quality:  Sudden Timing:  Intermittent Progression:  Waxing and waning Chronicity:  Recurrent Worsened by:  Nothing Ineffective treatments:  None tried Associated symptoms: cough, diaphoresis, fatigue, heartburn, nausea, near-syncope, palpitations and shortness of breath   Associated symptoms: no abdominal pain, no altered mental status, no back pain, no dizziness, no fever, no headache, no lower extremity edema, no syncope, no vomiting and no weakness   Risk factors: coronary artery disease, hypertension, male sex and prior DVT/PE       Past Medical History:  Diagnosis Date   Acquired thrombophilia ([place]) 11/01/2021   Arthritis    CAD (coronary artery disease)    Carotid artery occlusion    Cataract    Bil eyes/worse in left eye   CHF (congestive heart failure) (HCC)    Chronic back pain    COVID-19 10/31/2021   DVT (deep venous thrombosis) (HCC)    Dysrhythmia    Enlarged prostate    takes Rapaflo daily   GERD (gastroesophageal reflux disease)    occasional   History of colon polyps    History of gout    has colchicine prn   History of kidney stones    Hyperlipidemia    takes Crestor daily   Hypertension    takes Amlodipine daily   Hypothyroidism    Myocardial infarction [facility])    Peripheral vascular disease (HCC)    Prolonged QT interval 11/01/2021   Pulmonary emboli ([place]) 03/20/2015   elevated d-dimer, intermediate V/Q study, atypical chest pain and SOB. Start on Xarelto 20mg  BID for 3 month    Rapid atrial fibrillation (HCC)    Renal insufficiency    Shortness of breath dyspnea    Urinary frequency    Urinary urgency     Patient Active Problem List   Diagnosis Date Noted   Orthostatic hypotension 11/12/2021   Nausea 11/12/2021   Aortic stenosis 11/11/2021   Right lower lobe pneumonia 11/01/2021   Prolonged QT interval 11/01/2021   Acquired thrombophilia ([place]) 11/01/2021   Aortic atherosclerosis ([place]) 11/01/2021   Intractable nausea and vomiting 09/10/2021   Hypoxemia    Status post implantation of automatic cardioverter/defibrillator (AICD) 03/22/2021   Ventricular tachycardia 12/28/2020   (HFpEF) heart failure with preserved ejection fraction ([place]) 12/28/2020   NSTEMI (non-ST elevated myocardial infarction) ([place])    Acute respiratory failure with hypoxia ([place])    S/P total hip arthroplasty 08/16/2019   Unilateral primary osteoarthritis, right hip 07/18/2019   Persistent atrial fibrillation ([place]) 01/27/2019   Dizziness 01/27/2019   History of pneumonia 01/27/2019   Stage 3a chronic kidney disease (CKD) ([place]) 01/27/2019   Chest pain 02/07/2018   Elevated troponin 03/23/2017   Sigmoid diverticulitis 03/23/2017   Wrist pain, acute, right    Subclinical hypothyroidism    Dyspnea 02/26/2017   Shoulder blade pain 02/26/2017   SOB (shortness of breath) 02/26/2017   Chronic right shoulder pain    Hyperlipidemia LDL goal <70 01/20/2017   Chronic cholecystitis 07/27/2016   PAD (peripheral  artery disease) ([place]) 07/09/2015   S/P CABG x 4 04/05/2015   Atypical chest pain 03/20/2015   Carotid artery disease ([place]) 10/09/2014   PVD (peripheral vascular disease) ([place]) 10/09/2014   HTN (hypertension) 05/30/2014   Arthritis of left hip 01/09/2014   Status post THR (total hip replacement) 01/09/2014   Spinal stenosis, lumbar 01/06/2013    Class: Diagnosis of   Coronary artery disease 01/05/2013   Atherosclerosis of native artery of extremity with intermittent claudication  ([address]) 10/18/2012    Past Surgical History:  Procedure Laterality Date   APPENDECTOMY     BACK SURGERY     5 times   big toe surgery     CARDIAC CATHETERIZATION     2010    dr Acie Fredrickson   cataract surgery     left eye   CHOLECYSTECTOMY N/A 07/27/2016   Procedure: LAPAROSCOPIC CHOLECYSTECTOMY;  Surgeon: Mickeal Skinner, MD;  Location: [facility];  Service: General;  Laterality: N/A;   COLONOSCOPY     CORONARY ARTERY BYPASS GRAFT N/A 04/05/2015   Procedure: CORONARY ARTERY BYPASS GRAFTING (CABG)X4 LIMA-LAD; SVG-DIAG1-DIAG2; SVG-PD;  Surgeon: [name], MD;  Location: [facility];  Service: Open Heart Surgery;  Laterality: N/A;   CORONARY/GRAFT ANGIOGRAPHY N/A 04/22/2018   Procedure: CORONARY/GRAFT ANGIOGRAPHY;  Surgeon: Martinique, Peter M, MD;  Location: [facility] CV LAB;  Service: Cardiovascular;  Laterality: N/A;   CYSTOSCOPY     ENDARTERECTOMY Left 04/24/2016   Procedure: ENDARTERECTOMY LEFT CAROTID;  Surgeon: Mal Misty, MD;  Location: [facility];  Service: Vascular;  Laterality: Left;   EYE SURGERY     FEMORAL ARTERY - POPLITEAL ARTERY BYPASS GRAFT     ICD IMPLANT N/A 12/30/2020   Procedure: ICD IMPLANT;  Surgeon: Evans Lance, MD;  Location: [facility] CV LAB;  Service: Cardiovascular;  Laterality: N/A;   JOINT REPLACEMENT     shoulder   LEFT HEART CATHETERIZATION WITH CORONARY ANGIOGRAM N/A 04/03/2015   Procedure: LEFT HEART CATHETERIZATION WITH CORONARY ANGIOGRAM;  Surgeon: Troy Sine, MD;  Location: [facility] CATH LAB;  Service: Cardiovascular;  Laterality: N/A;   LUMBAR LAMINECTOMY  01/06/2013   Procedure: MICRODISCECTOMY LUMBAR LAMINECTOMY;  Surgeon: Marybelle Killings, MD;  Location: [facility];  Service: Orthopedics;  Laterality: N/A;  L3-4 decompression   LUMBAR LAMINECTOMY/DECOMPRESSION MICRODISCECTOMY  02/12/2012   Procedure: LUMBAR LAMINECTOMY/DECOMPRESSION MICRODISCECTOMY;  Surgeon: Floyce Stakes, MD;  Location: [facility] NEURO ORS;  Service: Neurosurgery;  Laterality: N/A;  Lumbar four-five  laminectomy   PATCH ANGIOPLASTY Left 04/24/2016   Procedure: LEFT CAROTID ARTERY PATCH ANGIOPLASTY;  Surgeon: Mal Misty, MD;  Location: [facility];  Service: Vascular;  Laterality: Left;   RIGHT HEART CATH AND CORONARY/GRAFT ANGIOGRAPHY N/A 01/30/2019   Procedure: RIGHT HEART CATH AND CORONARY/GRAFT ANGIOGRAPHY;  Surgeon: Troy Sine, MD;  Location: [place] CV LAB;  Service: Cardiovascular;  Laterality: N/A;   STERIOD INJECTION Right 01/09/2014   Procedure: STEROID INJECTION;  Surgeon: Mcarthur Rossetti, MD;  Location: [facility];  Service: Orthopedics;  Laterality: Right;   TEE WITHOUT CARDIOVERSION N/A 04/05/2015   Procedure: TRANSESOPHAGEAL ECHOCARDIOGRAM (TEE);  Surgeon: [name], MD;  Location: [facility];  Service: Open Heart Surgery;  Laterality: N/A;   TOTAL HIP ARTHROPLASTY Left 01/09/2014   DR Ninfa Linden   TOTAL HIP ARTHROPLASTY Left 01/09/2014   Procedure: LEFT TOTAL HIP ARTHROPLASTY ANTERIOR APPROACH and Steroid Injection Right hip;  Surgeon: Mcarthur Rossetti, MD;  Location: [facility];  Service: Orthopedics;  Laterality: Left;  TOTAL HIP ARTHROPLASTY Right 08/15/2019   TOTAL HIP ARTHROPLASTY Right 08/15/2019   Procedure: RIGHT TOTAL HIP ARTHROPLASTY ANTERIOR APPROACH;  Surgeon: Mcarthur Rossetti, MD;  Location: [place];  Service: Orthopedics;  Laterality: Right;       Family History  Problem Relation Age of Onset   Heart disease Father    Heart attack Father    Heart disease Sister    Hypertension Sister    Heart attack Sister    Hypertension Mother    Diabetes Son     Social History   Tobacco Use   Smoking status: Former    Types: Cigarettes    Quit date: 02/04/1987    Years since quitting: 34.8   Smokeless tobacco: Former    Types: Chew    Quit date: 07/20/2009   Tobacco comments:    quit 35+yrs ago  Vaping Use   Vaping Use: Never used  Substance Use Topics   Alcohol use: No    Alcohol/week: 0.0 standard drinks   Drug use: No    Home  Medications Prior to Admission medications   Medication Sig Start Date End Date Taking? Authorizing Provider  allopurinol (ZYLOPRIM) 100 MG tablet Take 100 mg by mouth daily as needed (gout). 05/29/20   [provider]  ALPRAZolam Duanne Moron) 0.5 MG tablet Take 0.5 mg by mouth 3 (three) times daily as needed for anxiety or sleep. 10/07/21   [provider]  amiodarone (PACERONE) 200 MG tablet Take 1 tablet (200 mg total) by mouth daily. 09/11/21   Mercy Riding, MD  apixaban (ELIQUIS) 5 MG TABS tablet Take 1 tablet (5 mg total) by mouth 2 (two) times daily. 11/12/21 11/12/22  [name] T, PA-C  bisoprolol (ZEBETA) 5 MG tablet Take 0.5 tablets (2.5 mg total) by mouth daily. 11/12/21 11/12/22  [name] T, PA-C  empagliflozin (JARDIANCE) 10 MG TABS tablet Take 1 tablet (10 mg total) by mouth daily. 11/10/21   [name], [name], MD  finasteride (PROSCAR) 5 MG tablet Take 5 mg by mouth daily. 08/18/21   [provider]  furosemide (LASIX) 20 MG tablet Take 1 tablet (20 mg total) by mouth every other day. 11/12/21 05/11/22  [name] T, PA-C  levothyroxine (SYNTHROID) 25 MCG tablet Take 25 mcg by mouth daily. 07/02/21   [provider]  metoCLOPramide (REGLAN) 5 MG tablet Take 1 tablet (5 mg total) by mouth every 6 (six) hours as needed for nausea. 11/13/21   Carlisle Cater, PA-C  nitroGLYCERIN (NITROSTAT) 0.4 MG SL tablet Place 1 tablet (0.4 mg total) under the tongue every 5 (five) minutes as needed for chest pain. 07/09/21   Evans Lance, MD  ondansetron (ZOFRAN-ODT) 4 MG disintegrating tablet Take 4 mg by mouth every 6 (six) hours as needed for nausea/vomiting. 10/30/21   [provider]  pantoprazole (PROTONIX) 40 MG tablet Take 40 mg by mouth daily. 09/30/21   [provider]  pantoprazole (PROTONIX) 40 MG tablet Take 1 tablet (40 mg total) by mouth daily. 11/12/21   Weaver, Scott T, PA-C  REPATHA SURECLICK 482 MG/ML SOAJ INJECT 1 PEN INTO  THE SKIN EVERY 14 DAYS. Patient taking differently: Inject 140 mg into the skin every 14 (fourteen) days. 09/16/21   [name], [name], MD  spironolactone (ALDACTONE) 25 MG tablet Take 0.5 tablets (12.5 mg total) by mouth daily. 10/16/21 10/11/22  Loel Dubonnet, NP  tamsulosin (FLOMAX) 0.4 MG CAPS capsule Take 0.4 mg by mouth at bedtime. 03/01/17  [provider]    Allergies    Zetia [ezetimibe], Ace inhibitors, Aspirin, Atorvastatin, Ezetimibe-simvastatin, Codeine, and Unithroid [levothyroxine sodium]  Review of Systems   Review of Systems  Constitutional:  Positive for diaphoresis and fatigue. Negative for chills and fever.  HENT:  Negative for congestion.   Eyes:  Negative for visual disturbance.  Respiratory:  Positive for cough and shortness of breath. Negative for chest tightness and wheezing.   Cardiovascular:  Positive for chest pain, palpitations and near-syncope. Negative for leg swelling and syncope.  Gastrointestinal:  Positive for constipation, heartburn and nausea. Negative for abdominal pain, diarrhea and vomiting.  Genitourinary:  Negative for dysuria, flank pain and frequency.  Musculoskeletal:  Negative for back pain, neck pain and neck stiffness.  Skin:  Negative for rash and wound.  Neurological:  Positive for light-headedness. Negative for dizziness, weakness and headaches.  Psychiatric/Behavioral:  Negative for agitation and confusion.    Physical Exam Updated Vital Signs BP (!) 155/76   Pulse 69   Temp 98.3 F (36.8 C) (Oral)   Resp 15   Ht 5\' 6"  (1.676 m)   Wt 61.2 kg   SpO2 100%   BMI 21.79 kg/m   Physical Exam Vitals and nursing note reviewed.  Constitutional:      General: He is not in acute distress.    Appearance: He is well-developed. He is not ill-appearing, toxic-appearing or diaphoretic.  HENT:     Head: Normocephalic and atraumatic.  Eyes:     Conjunctiva/sclera: Conjunctivae normal.  Cardiovascular:     Rate and Rhythm:  Normal rate and regular rhythm.     Heart sounds: No murmur heard. Pulmonary:     Effort: Pulmonary effort is normal. No respiratory distress.     Breath sounds: Normal breath sounds. No decreased breath sounds, wheezing, rhonchi or rales.  Chest:     Chest wall: No tenderness.  Abdominal:     Palpations: Abdomen is soft.     Tenderness: There is no abdominal tenderness.  Musculoskeletal:        General: No swelling.     Cervical back: Neck supple.     Right lower leg: No tenderness. No edema.     Left lower leg: No tenderness. No edema.  Skin:    General: Skin is warm and dry.     Capillary Refill: Capillary refill takes less than 2 seconds.     Findings: No erythema.  Neurological:     General: No focal deficit present.     Mental Status: He is alert.  Psychiatric:        Mood and Affect: Mood normal.    ED Results / Procedures / Treatments   Labs (all labs ordered are listed, but only abnormal results are displayed) Labs Reviewed  BASIC METABOLIC PANEL - Abnormal; Notable for the following components:      Result Value   Glucose, Bld 150 (*)    Creatinine, Ser 1.25 (*)    GFR, Estimated 58 (*)    All other components within normal limits  CBC - Abnormal; Notable for the following components:   WBC 10.9 (*)    All other components within normal limits  TROPONIN I (HIGH SENSITIVITY) - Abnormal; Notable for the following components:   Troponin I (High Sensitivity) 41 (*)    All other components within normal limits  TROPONIN I (HIGH SENSITIVITY) - Abnormal; Notable for the following components:   Troponin I (High Sensitivity) 47 (*)    All  other components within normal limits  RESP PANEL BY RT-PCR (FLU A&B, COVID) [id number]  PROTIME-INR    EKG None  Radiology DG Chest 2 View  Result Date: 11/17/2021 CLINICAL DATA:  Shortness of breath.  Chest pain. EXAM: CHEST - 2 VIEW COMPARISON:  Two-view chest x-ray 11/13/2021 FINDINGS: Heart size is upper limits of normal.  Atherosclerotic changes are noted at the aortic arch. Patient is status post median sternotomy for CABG. AICD in place. Lungs are clear. Left shoulder hemiarthroplasty noted. Surgical clips are present in the upper abdomen. IMPRESSION: No acute cardiopulmonary disease. Electronically Signed   By: San Morelle M.D.   On: 11/17/2021 14:40    Procedures Procedures   Medications Ordered in ED Medications - No data to display  ED Course  I have reviewed the triage vital signs and the nursing notes.  Pertinent labs & imaging results that were available during my care of the patient were reviewed by me and considered in my medical decision making (see chart for details).    MDM Rules/Calculators/A&P                           MANCIL PFENNING is a 80 y.o. male with a past medical history significant for atrial fibrillation on Eliquis therapy, previous DVT/PE, CAD status post CABG, CHF with ICD, prolonged QT, CKD, COVID several weeks ago and recent pneumonia, and hypertension who presents with chest pain.  According to patient, he has felt more fatigue last few days but then this morning woke up with his heart "shivering" with severe palpitations and chest pain.  Reports it was a crushing weight on his central chest and he could not even sit up.  He reports he was covered in sweat, nauseous, and lightheaded.  He reports that wax and wane throughout the day but is slightly improved now.  He reports he does not feel like it is shivering now.  He was told that his defibrillator fired the other night when he was asleep when he had an episode of VT.  He says that the chest pain location and pressure feel similar to what it was when he had to have his bypass surgery.  He reports he felt nauseous no vomiting.  He also reports on chronic constipation that he has been working on.  Denies abdominal pain at this time.  Denies any leg pain or leg swelling.  Denies recent fevers, chills, congestion, or cough.   Denies recent trauma.  On exam, lungs are clear and chest was nontender.  Abdomen was nontender.  Normal bowel sounds.  Lungs did not have significant rhonchi or rales.  Legs were nontender and nonedematous.  Patient resting.  Normal oxygen saturations and he is not tachycardic at this time.  EKG showed A. fib without evidence of STEMI.  Prolonged QT is still present.  Clinically I am somewhat concerned that his episode this morning is concerning for recurrent VT.  He says that he does not member being shocked last time and thinks he may have passed out when he was shocked previously.  He does not think he passed out today.  He is tearful and crying saying that he is very concerned about what happened this morning.  He had work-up started in triage initially that had 2 troponins that were both in the 40s similar to what he had in the past.  Initial chest x-ray did not show new pneumonia.  COVID test is now  negative.   Otherwise his metabolic panel did not show significant electrolyte abnormality.  Mild leukocytosis but no anemia.  Will call cardiology to discuss this concerning episode today.  He does not have the box to transmit his ICD at this time as it is at home.  Will discuss with cardiology it is high risk chest pain and near syncope and concern for possible intermittent arrhythmias.  Anticipate disposition based on cardiology recommendations.  7:49 PM Spoke to cardiology who completely agrees he needs admission for telemetry monitoring and electrophysiology to see him in the morning.  Due to his constipation/diarrhea troubles, they did feel that it was appropriate for medicine to admit with them following.  They will see the patient.  Anticipate his device being interrogated and will defer to the medicine team about further management for his constipation and diarrhea as I am more concerned about this intermittent episodes of suspected ventricular tachycardia and severe crushing chest pain that  felt similar to previous cardiac pains.  Will call medicine for admission.   Final Clinical Impression(s) / ED Diagnoses Final diagnoses:  Chest pain, unspecified type    Clinical Impression: 1. Chest pain, unspecified type     Disposition: Admit  This note was prepared with assistance of Dragon voice recognition software. Occasional wrong-word or sound-a-like substitutions may have occurred due to the inherent limitations of voice recognition software.     Palak Tercero, Gwenyth Allegra, MD 11/17/21 2151

## 2021-11-17 NOTE — Plan of Care (Signed)
  Problem: Health Behavior/Discharge Planning: Goal: Ability to manage health-related needs will improve Outcome: Progressing   Problem: Clinical Measurements: Goal: Ability to maintain clinical measurements within normal limits will improve Outcome: Progressing Goal: Will remain free from infection Outcome: Progressing Goal: Respiratory complications will improve Outcome: Progressing   Problem: Activity: Goal: Risk for activity intolerance will decrease Outcome: Progressing   Problem: Nutrition: Goal: Adequate nutrition will be maintained Outcome: Progressing   

## 2021-11-17 NOTE — Progress Notes (Signed)
  [facility] will assume care on arrival to accepting facility. Until arrival, care as per EDP. However, [facility] available 24/7 for questions and assistance.   Nursing staff please page [facility] Admits and Consults ([phone]) as soon as the patient arrives to the hospital.  Said Rueb, DO [facility]  

## 2021-11-18 ENCOUNTER — Ambulatory Visit: Payer: Medicare Other | Admitting: Orthopaedic Surgery

## 2021-11-18 DIAGNOSIS — I739 Peripheral vascular disease, unspecified: Secondary | ICD-10-CM | POA: Diagnosis not present

## 2021-11-18 DIAGNOSIS — R338 Other retention of urine: Secondary | ICD-10-CM | POA: Diagnosis present

## 2021-11-18 DIAGNOSIS — N401 Enlarged prostate with lower urinary tract symptoms: Secondary | ICD-10-CM | POA: Diagnosis present

## 2021-11-18 DIAGNOSIS — M109 Gout, unspecified: Secondary | ICD-10-CM | POA: Diagnosis present

## 2021-11-18 DIAGNOSIS — R17 Unspecified jaundice: Secondary | ICD-10-CM | POA: Diagnosis present

## 2021-11-18 DIAGNOSIS — I6523 Occlusion and stenosis of bilateral carotid arteries: Secondary | ICD-10-CM | POA: Diagnosis not present

## 2021-11-18 DIAGNOSIS — Z20822 Contact with and (suspected) exposure to covid-19: Secondary | ICD-10-CM | POA: Diagnosis present

## 2021-11-18 DIAGNOSIS — Z8616 Personal history of COVID-19: Secondary | ICD-10-CM | POA: Diagnosis not present

## 2021-11-18 DIAGNOSIS — I480 Paroxysmal atrial fibrillation: Secondary | ICD-10-CM | POA: Diagnosis not present

## 2021-11-18 DIAGNOSIS — I70209 Unspecified atherosclerosis of native arteries of extremities, unspecified extremity: Secondary | ICD-10-CM | POA: Diagnosis present

## 2021-11-18 DIAGNOSIS — K5909 Other constipation: Secondary | ICD-10-CM | POA: Diagnosis present

## 2021-11-18 DIAGNOSIS — I130 Hypertensive heart and chronic kidney disease with heart failure and stage 1 through stage 4 chronic kidney disease, or unspecified chronic kidney disease: Secondary | ICD-10-CM | POA: Diagnosis present

## 2021-11-18 DIAGNOSIS — E039 Hypothyroidism, unspecified: Secondary | ICD-10-CM | POA: Diagnosis present

## 2021-11-18 DIAGNOSIS — N1831 Chronic kidney disease, stage 3a: Secondary | ICD-10-CM | POA: Diagnosis present

## 2021-11-18 DIAGNOSIS — K30 Functional dyspepsia: Secondary | ICD-10-CM | POA: Diagnosis present

## 2021-11-18 DIAGNOSIS — I4819 Other persistent atrial fibrillation: Secondary | ICD-10-CM | POA: Diagnosis present

## 2021-11-18 DIAGNOSIS — I700 Atherosclerosis of aorta: Secondary | ICD-10-CM | POA: Diagnosis present

## 2021-11-18 DIAGNOSIS — E785 Hyperlipidemia, unspecified: Secondary | ICD-10-CM | POA: Diagnosis present

## 2021-11-18 DIAGNOSIS — I472 Ventricular tachycardia, unspecified: Secondary | ICD-10-CM | POA: Diagnosis present

## 2021-11-18 DIAGNOSIS — I5032 Chronic diastolic (congestive) heart failure: Secondary | ICD-10-CM | POA: Diagnosis present

## 2021-11-18 DIAGNOSIS — D6859 Other primary thrombophilia: Secondary | ICD-10-CM | POA: Diagnosis present

## 2021-11-18 DIAGNOSIS — K219 Gastro-esophageal reflux disease without esophagitis: Secondary | ICD-10-CM | POA: Diagnosis present

## 2021-11-18 DIAGNOSIS — I10 Essential (primary) hypertension: Secondary | ICD-10-CM | POA: Diagnosis not present

## 2021-11-18 DIAGNOSIS — R079 Chest pain, unspecified: Secondary | ICD-10-CM | POA: Diagnosis not present

## 2021-11-18 DIAGNOSIS — I350 Nonrheumatic aortic (valve) stenosis: Secondary | ICD-10-CM | POA: Diagnosis present

## 2021-11-18 DIAGNOSIS — I4729 Other ventricular tachycardia: Secondary | ICD-10-CM | POA: Diagnosis present

## 2021-11-18 DIAGNOSIS — F419 Anxiety disorder, unspecified: Secondary | ICD-10-CM | POA: Diagnosis present

## 2021-11-18 DIAGNOSIS — Z95820 Peripheral vascular angioplasty status with implants and grafts: Secondary | ICD-10-CM | POA: Diagnosis not present

## 2021-11-18 DIAGNOSIS — I2510 Atherosclerotic heart disease of native coronary artery without angina pectoris: Secondary | ICD-10-CM | POA: Diagnosis present

## 2021-11-18 DIAGNOSIS — I4892 Unspecified atrial flutter: Secondary | ICD-10-CM | POA: Diagnosis present

## 2021-11-18 LAB — CBC WITH DIFFERENTIAL/PLATELET
Abs Immature Granulocytes: 0.03 10*3/uL (ref 0.00–0.07)
Basophils Absolute: 0 10*3/uL (ref 0.0–0.1)
Basophils Relative: 1 %
Eosinophils Absolute: 0.1 10*3/uL (ref 0.0–0.5)
Eosinophils Relative: 1 %
HCT: 41.4 % (ref 39.0–52.0)
Hemoglobin: 13.8 g/dL (ref 13.0–17.0)
Immature Granulocytes: 1 %
Lymphocytes Relative: 19 %
Lymphs Abs: 1.3 10*3/uL (ref 0.7–4.0)
MCH: 32.6 pg (ref 26.0–34.0)
MCHC: 33.3 g/dL (ref 30.0–36.0)
MCV: 97.9 fL (ref 80.0–100.0)
Monocytes Absolute: 0.6 10*3/uL (ref 0.1–1.0)
Monocytes Relative: 9 %
Neutro Abs: 4.6 10*3/uL (ref 1.7–7.7)
Neutrophils Relative %: 69 %
Platelets: 231 10*3/uL (ref 150–400)
RBC: 4.23 MIL/uL (ref 4.22–5.81)
RDW: 13.6 % (ref 11.5–15.5)
WBC: 6.5 10*3/uL (ref 4.0–10.5)
nRBC: 0 % (ref 0.0–0.2)

## 2021-11-18 LAB — COMPREHENSIVE METABOLIC PANEL
ALT: 18 U/L (ref 0–44)
AST: 26 U/L (ref 15–41)
Albumin: 3 g/dL — ABNORMAL LOW (ref 3.5–5.0)
Alkaline Phosphatase: 52 U/L (ref 38–126)
Anion gap: 7 (ref 5–15)
BUN: 14 mg/dL (ref 8–23)
CO2: 25 mmol/L (ref 22–32)
Calcium: 8.4 mg/dL — ABNORMAL LOW (ref 8.9–10.3)
Chloride: 105 mmol/L (ref 98–111)
Creatinine, Ser: 1.26 mg/dL — ABNORMAL HIGH (ref 0.61–1.24)
GFR, Estimated: 58 mL/min — ABNORMAL LOW (ref >60)
Glucose, Bld: 102 mg/dL — ABNORMAL HIGH (ref 70–99)
Potassium: 4.5 mmol/L (ref 3.5–5.1)
Sodium: 137 mmol/L (ref 135–145)
Total Bilirubin: 1.8 mg/dL — ABNORMAL HIGH (ref 0.3–1.2)
Total Protein: 5.7 g/dL — ABNORMAL LOW (ref 6.5–8.1)

## 2021-11-18 LAB — TROPONIN I (HIGH SENSITIVITY): Troponin I (High Sensitivity): 62 ng/L — ABNORMAL HIGH (ref <18)

## 2021-11-18 MED ORDER — TAMSULOSIN HCL 0.4 MG PO CAPS
0.4000 mg | ORAL_CAPSULE | Freq: Every day | ORAL | Status: DC
  Administered 2021-11-18: 0.4 mg via ORAL
  Filled 2021-11-18: qty 1

## 2021-11-18 MED ORDER — POLYETHYLENE GLYCOL 3350 17 G PO PACK
17.0000 g | PACK | Freq: Every day | ORAL | Status: DC
  Administered 2021-11-18: 17 g via ORAL

## 2021-11-18 MED ORDER — APIXABAN 5 MG PO TABS
5.0000 mg | ORAL_TABLET | Freq: Two times a day (BID) | ORAL | Status: DC
  Administered 2021-11-18 – 2021-11-19 (×3): 5 mg via ORAL
  Filled 2021-11-18 (×2): qty 1

## 2021-11-18 MED ORDER — FINASTERIDE 5 MG PO TABS
5.0000 mg | ORAL_TABLET | Freq: Every day | ORAL | Status: DC
  Administered 2021-11-18 – 2021-11-19 (×2): 5 mg via ORAL
  Filled 2021-11-18: qty 1

## 2021-11-18 MED ORDER — SENNOSIDES-DOCUSATE SODIUM 8.6-50 MG PO TABS
1.0000 | ORAL_TABLET | Freq: Every day | ORAL | Status: DC
  Administered 2021-11-18: 1 via ORAL
  Filled 2021-11-18: qty 1

## 2021-11-18 MED ORDER — LEVOTHYROXINE SODIUM 25 MCG PO TABS
25.0000 ug | ORAL_TABLET | Freq: Every day | ORAL | Status: DC
  Administered 2021-11-18 – 2021-11-19 (×2): 25 ug via ORAL
  Filled 2021-11-18: qty 1

## 2021-11-18 MED ORDER — AMIODARONE HCL 200 MG PO TABS
200.0000 mg | ORAL_TABLET | Freq: Every day | ORAL | Status: DC
  Administered 2021-11-18 – 2021-11-19 (×2): 200 mg via ORAL
  Filled 2021-11-18: qty 1

## 2021-11-18 MED ORDER — MELATONIN 5 MG PO TABS
5.0000 mg | ORAL_TABLET | Freq: Every evening | ORAL | Status: DC | PRN
  Administered 2021-11-18 (×2): 5 mg via ORAL
  Filled 2021-11-18: qty 1

## 2021-11-18 MED ORDER — PANTOPRAZOLE SODIUM 40 MG PO TBEC
40.0000 mg | DELAYED_RELEASE_TABLET | Freq: Every day | ORAL | Status: DC
  Administered 2021-11-18 – 2021-11-19 (×2): 40 mg via ORAL
  Filled 2021-11-18: qty 1

## 2021-11-18 MED ORDER — HYDRALAZINE HCL 20 MG/ML IJ SOLN: 5.0000 mg | INTRAMUSCULAR | Status: DC | PRN

## 2021-11-18 MED ORDER — CHLORHEXIDINE GLUCONATE CLOTH 2 % EX PADS
6.0000 | MEDICATED_PAD | Freq: Every day | CUTANEOUS | Status: DC
  Administered 2021-11-18 – 2021-11-19 (×2): 6 via TOPICAL

## 2021-11-18 MED ORDER — ALLOPURINOL 100 MG PO TABS: 100.0000 mg | ORAL_TABLET | Freq: Every day | ORAL | Status: DC | PRN

## 2021-11-18 MED ORDER — BISOPROLOL FUMARATE 5 MG PO TABS
2.5000 mg | ORAL_TABLET | Freq: Every day | ORAL | Status: DC
  Administered 2021-11-18 – 2021-11-19 (×2): 2.5 mg via ORAL
  Filled 2021-11-18 (×3): qty 0.5

## 2021-11-18 MED ORDER — ALPRAZOLAM 0.5 MG PO TABS: 0.5000 mg | ORAL_TABLET | Freq: Three times a day (TID) | ORAL | Status: DC | PRN

## 2021-11-18 NOTE — Progress Notes (Signed)
Mobility Specialist Progress Note    11/18/21 1557  Mobility  Activity Refused mobility   Pt stated he would rather stay in bed in case he needed to use the BR.   [facility] Mobility Specialist  M.S. Primary Phone: [phone] M.S. Secondary Phone: [phone]

## 2021-11-18 NOTE — Progress Notes (Addendum)
PROGRESS NOTE   Ryan [name]  IZT:[id number] DOB: 11-02-1941 DOA: 11/17/2021 PCP: Ginger Organ., MD  Brief Narrative:  80 year old community dwelling independent white male CABG 2016 HFrEF-->improved HFpEF 50-55% 08/28/2021 aortic stenosis A. fib CHADS2 score >4 on Eliquis VT with ICD DVT/PE in the past PAD with aortobifem bypass left CEA prior CVA HTN HLD hypothyroid BPH with LUTS vertigo Has had several left admissions gait program September for respiratory failure secondary to decompensated heart failure  Recent Rx COVID 10/31/2021 through 11/01/2021 and discharged on 11/13-completed Paxlovid, also Rx 7 days of Ceftin   Recent Rx changes cardiology decrease Lasix changed to bisoprolol secondary to hypotension-evaluated 11/24 in ED and sent home as no acute findings--re- presented with near syncope and chest pain 11/28-?  Defibrillator firing while asleep--- work-up revealed A. fib without STEMI, long QT troponin trend flat borderline leukocytosis Cardiology EP will see the patient  Hospital-Problem based course  ?  Recurrent VT?  Chest pain A. fib CHADS2 score >4 on Eliquis Troponin trend relatively flat EKG personally over read no STEMI atrial fibrillation Resume bisoprolol 2.5 daily-amiodarone on hold at this time given risk for prolonged QT--- defer to cardiology when to resume Resume apixaban 5 twice daily GI upset-chronic constipation Patient states that he seems to have relief from belching and flatus but does not have actual chest pain He has had diarrhea recently so I do not know if he is hypomagnesemic we will check this in the morning and replace as needed I will make sure that he is on proper regimen of laxatives during hospital stay HFrEF with improvement HFpEF 55% 08/28/2021 CABG 2016 Aortic stenosis Continue bisoprolol 2.5 mg daily as above Holding at this time Aldactone 12.5 hold Jardiance 10 and Lasix 20 q. other day for now Last hospital weight with 66 kg he  is at the same weight today PAD, CEA Prior reported CVA in the past Outpatient surveillance and monitoring BPH with LUTS Resume finasteride 5 daily, Flomax 0.4 at bedtime Patient request to have indwelling Foley catheter because he has difficulty passing urine during hospitalization-he will need a voiding trial prior to discharge Hypothyroid Continue levothyroxine 25 daily Reflux Continue Protonix 40 daily Anxiety Continue Xanax 0.5 3 times daily outpatient wean Prediabetes A1c 6.09/22 Only monitor at this time no treatment discussed with outpatient  DVT prophylaxis: Eliquis Code Status: Full Family Communication: None present Disposition:  Status is: Observation  The patient will require care spanning > 2 midnights and should be moved to inpatient because: Need for pacemaker interrogation and discussion regarding the same from cardiology    Consultants:  Electrophysiology  Procedures: None  Antimicrobials:  Subjective: Patient looks well no distress He ate about 50% of his breakfast he states he has primarily abdominal discomfort and bloating and difficulty passing stool-no chest pain whatsoever On monitor has sinus bradycardia with dropped beats at times  Objective: Vitals:   11/17/21 2229 11/17/21 2300 11/18/21 0000 11/18/21 0402  BP: (!) 189/75 (!) 149/64 (!) 156/69 (!) 106/59  Pulse: 64 60 61 61  Resp: 18 16 15 12   Temp: 97.7 F (36.5 C)  98.1 F (36.7 C) 98.7 F (37.1 C)  TempSrc: Oral  Oral Oral  SpO2: 95% (!) 89% 96% 98%  Weight: 66.3 kg     Height: 5\' 6"  (1.676 m)       Intake/Output Summary (Last 24 hours) at 11/18/2021 0717 Last data filed at 11/18/2021 0629 Gross per 24 hour  Intake --  Output  700 ml  Net -700 ml   Filed Weights   11/17/21 1347 11/17/21 2229  Weight: 61.2 kg 66.3 kg    Examination:  Alert coherent looks younger than stated age EOMI NCAT pterygium on right side No icterus no pallor neck soft supple No JVD S1-S2  bradycardic Abdomen soft no rebound no guarding No lower extremity edema Neurologically intact no focal deficit  Data Reviewed: personally reviewed   CBC    Component Value Date/Time   WBC 10.9 (H) 11/17/2021 1358   RBC 4.47 11/17/2021 1358   HGB 14.4 11/17/2021 1358   HGB 14.5 07/29/2020 1213   HCT 44.3 11/17/2021 1358   HCT 44.6 07/29/2020 1213   PLT 246 11/17/2021 1358   PLT 315 07/29/2020 1213   MCV 99.1 11/17/2021 1358   MCV 97 07/29/2020 1213   MCH 32.2 11/17/2021 1358   MCHC 32.5 11/17/2021 1358   RDW 13.7 11/17/2021 1358   RDW 13.3 07/29/2020 1213   LYMPHSABS 1.6 10/31/2021 1632   LYMPHSABS 1.2 07/29/2020 1213   MONOABS 0.9 10/31/2021 1632   EOSABS 0.0 10/31/2021 1632   EOSABS 0.1 07/29/2020 1213   BASOSABS 0.0 10/31/2021 1632   BASOSABS 0.1 07/29/2020 1213   CMP Latest Ref Rng & Units 11/17/2021 11/13/2021 11/02/2021  Glucose 70 - 99 mg/dL 150(H) 102(H) 109(H)  BUN 8 - 23 mg/dL 22 16 14   Creatinine 0.61 - 1.24 mg/dL 1.25(H) 1.20 1.06  Sodium 135 - 145 mmol/L 141 138 134(L)  Potassium 3.5 - 5.1 mmol/L 4.4 4.4 3.9  Chloride 98 - 111 mmol/L 105 102 104  CO2 22 - 32 mmol/L 26 27 22   Calcium 8.9 - 10.3 mg/dL 9.1 9.7 7.9(L)  Total Protein 6.5 - 8.1 g/dL - 6.9 -  Total Bilirubin 0.3 - 1.2 mg/dL - 1.2 -  Alkaline Phos 38 - 126 U/L - 53 -  AST 15 - 41 U/L - 25 -  ALT 0 - 44 U/L - 14 -     Radiology Studies: DG Chest 2 View  Result Date: 11/17/2021 CLINICAL DATA:  Shortness of breath.  Chest pain. EXAM: CHEST - 2 VIEW COMPARISON:  Two-view chest x-ray 11/13/2021 FINDINGS: Heart size is upper limits of normal. Atherosclerotic changes are noted at the aortic arch. Patient is status post median sternotomy for CABG. AICD in place. Lungs are clear. Left shoulder hemiarthroplasty noted. Surgical clips are present in the upper abdomen. IMPRESSION: No acute cardiopulmonary disease. Electronically Signed   By: San Morelle M.D.   On: 11/17/2021 14:40      Scheduled Meds:  apixaban  5 mg Oral BID   bisoprolol  2.5 mg Oral Daily   finasteride  5 mg Oral Daily   levothyroxine  25 mcg Oral Q0600   pantoprazole  40 mg Oral Daily   polyethylene glycol  17 g Oral Daily   tamsulosin  0.4 mg Oral QHS   Continuous Infusions:   LOS: 0 days   Time spent: [place], MD [facility] To contact the attending provider between 7A-7P or the covering provider during after hours 7P-7A, please log into the web site [email or web address] and access using universal [place] password for that web site. If you do not have the password, please call the hospital operator.  11/18/2021, 7:17 AM

## 2021-11-18 NOTE — TOC Progression Note (Addendum)
Transition of Care [facility]) - Progression Note    Patient Details  Name: Ryan [name] MRN: [id number] Date of Birth: 24-Nov-1941  Transition of Care [facility]) CM/SW Contact  Zenon Mayo, RN Phone Number: 11/18/2021, 3:55 PM  Clinical Narrative:    from home with wife, sob, chest pain, 4th admission, cards consulted , pacemaker may be interrogated. TOC will continue to follow for dc needs.        Expected Discharge Plan and Services                                                 Social Determinants of Health (SDOH) Interventions    Readmission Risk Interventions No flowsheet data found.

## 2021-11-18 NOTE — Plan of Care (Signed)
  Problem: Education: Goal: Knowledge of General Education information will improve Description: Including pain rating scale, medication(s)/side effects and non-pharmacologic comfort measures Outcome: Progressing   Problem: Health Behavior/Discharge Planning: Goal: Ability to manage health-related needs will improve Outcome: Progressing   Problem: Clinical Measurements: Goal: Ability to maintain clinical measurements within normal limits will improve Outcome: Progressing Goal: Diagnostic test results will improve Outcome: Progressing   Problem: Elimination: Goal: Will not experience complications related to urinary retention Outcome: Progressing

## 2021-11-18 NOTE — Progress Notes (Addendum)
  Device interrogation reviewed with [employer].   Single chamber ICD programmed VVI 40.  No episodes since 11/19 (addendum). No episode explaining his symptoms, which per notes are improving as he belches and has flatus.   EKG shows atrial flutter with stable QTc from previous. Please resume amiodarone given recent VT with ATP and ICD already in place.   Please call with any questions.  Legrand Como [address] [place], PA-C  11/18/2021 9:06 AM

## 2021-11-19 DIAGNOSIS — N1831 Chronic kidney disease, stage 3a: Secondary | ICD-10-CM | POA: Diagnosis not present

## 2021-11-19 DIAGNOSIS — I10 Essential (primary) hypertension: Secondary | ICD-10-CM | POA: Diagnosis not present

## 2021-11-19 DIAGNOSIS — I6523 Occlusion and stenosis of bilateral carotid arteries: Secondary | ICD-10-CM

## 2021-11-19 DIAGNOSIS — I472 Ventricular tachycardia, unspecified: Principal | ICD-10-CM

## 2021-11-19 DIAGNOSIS — E785 Hyperlipidemia, unspecified: Secondary | ICD-10-CM | POA: Diagnosis not present

## 2021-11-19 DIAGNOSIS — I2510 Atherosclerotic heart disease of native coronary artery without angina pectoris: Secondary | ICD-10-CM

## 2021-11-19 DIAGNOSIS — I739 Peripheral vascular disease, unspecified: Secondary | ICD-10-CM

## 2021-11-19 DIAGNOSIS — I480 Paroxysmal atrial fibrillation: Secondary | ICD-10-CM | POA: Diagnosis not present

## 2021-11-19 LAB — CBC WITH DIFFERENTIAL/PLATELET
Abs Immature Granulocytes: 0.04 10*3/uL (ref 0.00–0.07)
Basophils Absolute: 0 10*3/uL (ref 0.0–0.1)
Basophils Relative: 1 %
Eosinophils Absolute: 0.1 10*3/uL (ref 0.0–0.5)
Eosinophils Relative: 2 %
HCT: 39.5 % (ref 39.0–52.0)
Hemoglobin: 13.1 g/dL (ref 13.0–17.0)
Immature Granulocytes: 1 %
Lymphocytes Relative: 20 %
Lymphs Abs: 1.3 10*3/uL (ref 0.7–4.0)
MCH: 32.4 pg (ref 26.0–34.0)
MCHC: 33.2 g/dL (ref 30.0–36.0)
MCV: 97.8 fL (ref 80.0–100.0)
Monocytes Absolute: 0.7 10*3/uL (ref 0.1–1.0)
Monocytes Relative: 11 %
Neutro Abs: 4.4 10*3/uL (ref 1.7–7.7)
Neutrophils Relative %: 65 %
Platelets: 199 10*3/uL (ref 150–400)
RBC: 4.04 MIL/uL — ABNORMAL LOW (ref 4.22–5.81)
RDW: 13.7 % (ref 11.5–15.5)
WBC: 6.6 10*3/uL (ref 4.0–10.5)
nRBC: 0 % (ref 0.0–0.2)

## 2021-11-19 LAB — COMPREHENSIVE METABOLIC PANEL
ALT: 15 U/L (ref 0–44)
AST: 20 U/L (ref 15–41)
Albumin: 2.7 g/dL — ABNORMAL LOW (ref 3.5–5.0)
Alkaline Phosphatase: 44 U/L (ref 38–126)
Anion gap: 6 (ref 5–15)
BUN: 14 mg/dL (ref 8–23)
CO2: 26 mmol/L (ref 22–32)
Calcium: 8.5 mg/dL — ABNORMAL LOW (ref 8.9–10.3)
Chloride: 105 mmol/L (ref 98–111)
Creatinine, Ser: 1.31 mg/dL — ABNORMAL HIGH (ref 0.61–1.24)
GFR, Estimated: 55 mL/min — ABNORMAL LOW (ref >60)
Glucose, Bld: 113 mg/dL — ABNORMAL HIGH (ref 70–99)
Potassium: 4.1 mmol/L (ref 3.5–5.1)
Sodium: 137 mmol/L (ref 135–145)
Total Bilirubin: 0.9 mg/dL (ref 0.3–1.2)
Total Protein: 5.4 g/dL — ABNORMAL LOW (ref 6.5–8.1)

## 2021-11-19 LAB — MAGNESIUM: Magnesium: 2.1 mg/dL (ref 1.7–2.4)

## 2021-11-19 MED ORDER — CALCIUM CARBONATE ANTACID 500 MG PO CHEW: 400.0000 mg | CHEWABLE_TABLET | Freq: Two times a day (BID) | ORAL | Status: DC

## 2021-11-19 MED ORDER — POLYETHYLENE GLYCOL 3350 17 G PO PACK: 17.0000 g | PACK | Freq: Every day | ORAL | 0 refills | Status: DC

## 2021-11-19 MED ORDER — ACETAMINOPHEN 325 MG PO TABS
650.0000 mg | ORAL_TABLET | Freq: Four times a day (QID) | ORAL | 0 refills | Status: DC | PRN
Start: 2021-11-19 — End: 2024-07-18

## 2021-11-19 MED ORDER — SENNOSIDES-DOCUSATE SODIUM 8.6-50 MG PO TABS: 1.0000 | ORAL_TABLET | Freq: Every day | ORAL | 0 refills | Status: DC

## 2021-11-19 MED ORDER — SODIUM CHLORIDE 0.9 % IV BOLUS: 500.0000 mL | Freq: Once | INTRAVENOUS | Status: DC

## 2021-11-19 MED ORDER — MECLIZINE HCL 12.5 MG PO TABS
12.5000 mg | ORAL_TABLET | Freq: Three times a day (TID) | ORAL | Status: DC | PRN
  Filled 2021-11-19: qty 1

## 2021-11-19 NOTE — Consult Note (Signed)
   [facility] Inpatient Consult   11/19/2021  SEGER JANI 1941-10-06 [id number]  [employer] [ACO] Patient: Medicare  Primary Care Provider:  Ginger Organ., MD, [facility]   Patient screened for readmission with 4 hospitalization with noted high risk score for unplanned readmission risk and  to assess for potential [place] Management service needs for post hospital transition.  Review of patient's medical record reveals patient is for home.   Plan:  Post hospital referral for Complex Care Management follow up/support.  For questions contact:   [name], RN BSN [facility] Liaison  [phone] business mobile phone Toll free office [phone]  Fax number: [phone] [email or web address]

## 2021-11-19 NOTE — Evaluation (Signed)
Occupational Therapy Evaluation Patient Details Name: Ryan [name] MRN: [id number] DOB: [birth year] Today's Date: 11/19/2021   History of Present Illness Patient is an 80 y/o male admitted due to chest pain, palpitations, and near syncope.  PMH positive for CAD, CABG 2016, CHFm AS, A-fib on Eliquis, VT s/p ICD, h/o DVT/PE, PAD s/p aortobifem bypass, L CEA, and recent COVID infection.   Clinical Impression   PTA patient independent and driving. Admitted for above and presenting near baseline at modified independence for ADLs and mobility in room.  Pt denies dizziness during session, reports mild weakness due to decreased mobility since admission. Orthostatic from sitting to standing, but recovers with prolonged standing with no signs or symptoms.  Patient reports his spouse can assist as needed after dc.  Based on performance today, no further OT needs identified and OT will sign off.   BP sitting:  144/68 (83) BP standing: 115/65 (80) BP standing x 3 minutes: 128/75 (93)     Recommendations for follow up therapy are one component of a multi-disciplinary discharge planning process, led by the attending physician.  Recommendations may be updated based on patient status, additional functional criteria and insurance authorization.   Follow Up Recommendations  No OT follow up    Assistance Recommended at Discharge PRN  Functional Status Assessment     Equipment Recommendations  None recommended by OT    Recommendations for Other Services       Precautions / Restrictions Precautions Precautions: Fall Restrictions Weight Bearing Restrictions: No      Mobility Bed Mobility               General bed mobility comments: OOB upon entry    Transfers Overall transfer level: Modified independent Equipment used: None                      Balance Overall balance assessment: Mild deficits observed, not formally tested                                          ADL either performed or assessed with clinical judgement   ADL Overall ADL's : Modified independent;At baseline                                             Vision         Perception     Praxis      Pertinent Vitals/Pain Pain Assessment: No/denies pain     Hand Dominance Right   Extremity/Trunk Assessment Upper Extremity Assessment Upper Extremity Assessment: Overall WFL for tasks assessed   Lower Extremity Assessment Lower Extremity Assessment: Defer to PT evaluation       Communication Communication Communication: HOH   Cognition Arousal/Alertness: Awake/alert Behavior During Therapy: WFL for tasks assessed/performed Overall Cognitive Status: Within Functional Limits for tasks assessed                                       General Comments   BP sitting:  144/68 (83) BP standing: 115/65 (80) BP standing x 3 minutes: 128/75 (93)    Exercises     Shoulder Instructions      Home Living  Family/patient expects to be discharged to:: Private residence Living Arrangements: Spouse/significant other Available Help at Discharge: Family;Available 24 hours/day Type of Home: House Home Access: Stairs to enter [employer] of Steps: 2 Entrance Stairs-Rails: Right Home Layout: Two level;Able to live on main level with bedroom/bathroom     Bathroom Shower/Tub: Occupational psychologist: Standard     Home Equipment: Conservation officer, nature (2 wheels)          Prior Functioning/Environment Prior Level of Function : Independent/Modified Independent               ADLs Comments: driving        OT Problem List:        OT Treatment/Interventions:      OT Goals(Current goals can be found in the care plan section) Acute Rehab OT Goals Patient Stated Goal: home today OT Goal Formulation: With patient  OT Frequency:     Barriers to D/C:            Co-evaluation              AM-PAC OT "6  Clicks" Daily Activity     Outcome Measure Help from another person eating meals?: None Help from another person taking care of personal grooming?: None Help from another person toileting, which includes using toliet, bedpan, or urinal?: None Help from another person bathing (including washing, rinsing, drying)?: None Help from another person to put on and taking off regular upper body clothing?: None Help from another person to put on and taking off regular lower body clothing?: None 6 Click Score: 24   End of Session Nurse Communication: Mobility status  Activity Tolerance: Patient tolerated treatment well Patient left: in chair;with call bell/phone within reach  OT Visit Diagnosis: Unsteadiness on feet (R26.81)                Time: 1301-1320 OT Time Calculation (min): 19 min Charges:  OT General Charges $OT Visit: 1 Visit OT Evaluation $OT Eval Low Complexity: 1 Low  Jolaine Artist, OT Acute Rehabilitation Services Pager [phone] Office [phone]   Delight Stare 11/19/2021, 1:51 PM

## 2021-11-19 NOTE — Progress Notes (Signed)
RN went over d/c summary with pt and pt's wife. NT removed PIV and foley catheter. NT transporting pt to private vehicle where pt's wife will transport pt home.

## 2021-11-19 NOTE — TOC Initial Note (Signed)
Transition of Care [facility]) - Initial/Assessment Note    Patient Details  Name: Ryan [name] MRN: [id number] Date of Birth: 1941/06/07  Transition of Care [facility]) CM/SW Contact:    Zenon Mayo, RN Phone Number: 11/19/2021, 2:44 PM  Clinical Narrative:                 Patient is for dc home, wife will transport him home, he has no needs.  Expected Discharge Plan: Home/Self Care Barriers to Discharge: No Barriers Identified   Patient Goals and CMS Choice Patient states their goals for this hospitalization and ongoing recovery are:: return home   Choice offered to / list presented to : NA  Expected Discharge Plan and Services Expected Discharge Plan: Home/Self Care   Discharge Planning Services: CM Consult Post Acute Care Choice: NA Living arrangements for the past 2 months: Single Family Home Expected Discharge Date: 11/19/21                 DME Agency: NA       HH Arranged: NA          Prior Living Arrangements/Services Living arrangements for the past 2 months: Single Family Home Lives with:: Spouse Patient language and need for interpreter reviewed:: Yes Do you feel safe going back to the place where you live?: Yes      Need for Family Participation in Patient Care: Yes (Comment) Care giver support system in place?: Yes (comment)   Criminal Activity/Legal Involvement Pertinent to Current Situation/Hospitalization: No - Comment as needed  Activities of Daily Living Home Assistive Devices/Equipment: None ADL Screening (condition at time of admission) Patient's cognitive ability adequate to safely complete daily activities?: Yes Is the patient deaf or have difficulty hearing?: No Does the patient have difficulty seeing, even when wearing glasses/contacts?: No Does the patient have difficulty concentrating, remembering, or making decisions?: No Patient able to express need for assistance with ADLs?: Yes Does the patient have difficulty dressing or  bathing?: No Independently performs ADLs?: Yes (appropriate for developmental age) Does the patient have difficulty walking or climbing stairs?: No Weakness of Legs: None Weakness of Arms/Hands: None  Permission Sought/Granted                  Emotional Assessment   Attitude/Demeanor/Rapport: Engaged Affect (typically observed): Appropriate Orientation: : Oriented to  Time, Oriented to Situation, Oriented to Place, Oriented to Self Alcohol / Substance Use: Not Applicable Psych Involvement: No (comment)  Admission diagnosis:  Recurrent chest pain [R07.9] Chest pain, unspecified type [R07.9] VT (ventricular tachycardia) [I47.20] Patient Active Problem List   Diagnosis Date Noted   VT (ventricular tachycardia) 11/18/2021   Orthostatic hypotension 11/12/2021   Nausea 11/12/2021   Aortic stenosis 11/11/2021   Right lower lobe pneumonia 11/01/2021   Prolonged QT interval 11/01/2021   Acquired thrombophilia ([place]) 11/01/2021   Aortic atherosclerosis ([place]) 11/01/2021   Intractable nausea and vomiting 09/10/2021   Hypoxemia    Status post implantation of automatic cardioverter/defibrillator (AICD) 03/22/2021   Ventricular tachycardia 12/28/2020   (HFpEF) heart failure with preserved ejection fraction ([place]) 12/28/2020   NSTEMI (non-ST elevated myocardial infarction) ([place])    Acute respiratory failure with hypoxia ([place])    S/P total hip arthroplasty 08/16/2019   Unilateral primary osteoarthritis, right hip 07/18/2019   Persistent atrial fibrillation ([place]) 01/27/2019   Dizziness 01/27/2019   History of pneumonia 01/27/2019   Stage 3a chronic kidney disease (CKD) ([place]) 01/27/2019   Chest pain 02/07/2018   Elevated troponin  03/23/2017   Sigmoid diverticulitis 03/23/2017   Wrist pain, acute, right    Subclinical hypothyroidism    Dyspnea 02/26/2017   Shoulder blade pain 02/26/2017   SOB (shortness of breath) 02/26/2017   Chronic right shoulder pain    Hyperlipidemia LDL goal  <70 01/20/2017   Chronic cholecystitis 07/27/2016   PAD (peripheral artery disease) ([place]) 07/09/2015   S/P CABG x 4 04/05/2015   Atypical chest pain 03/20/2015   Carotid artery disease ([place]) 10/09/2014   PVD (peripheral vascular disease) ([place]) 10/09/2014   HTN (hypertension) 05/30/2014   Arthritis of left hip 01/09/2014   Status post THR (total hip replacement) 01/09/2014   Spinal stenosis, lumbar 01/06/2013    Class: Diagnosis of   Coronary artery disease 01/05/2013   Atherosclerosis of native artery of extremity with intermittent claudication ([place]) 10/18/2012   PCP:  Ginger Organ., MD Pharmacy:   [facility] [id number] - [address], [place] [address] Phone: [phone] Fax: [phone]     Social Determinants of Health (SDOH) Interventions    Readmission Risk Interventions Readmission Risk Prevention Plan 11/19/2021  Transportation Screening Complete  PCP or Specialist Appt within 3-5 Days Complete  HRI or Home Care Consult Complete  Social Work Consult for [place] Planning/Counseling Complete  Palliative Care Screening Not Applicable  Medication Review Press photographer) Complete  Some recent data might be hidden

## 2021-11-19 NOTE — Discharge Summary (Signed)
Physician Discharge Summary  Ryan [name] GGE:[id number] DOB: 09/09/1941 DOA: 11/17/2021  PCP: Ginger Organ., MD  Admit date: 11/17/2021 Discharge date: 11/20/2021  Admitted From: Home Disposition: Home  Recommendations for Outpatient Follow-up:  Follow up with PCP in 1-2 weeks Follow-up with EP cardiology within 1 to 2 weeks Please obtain CMP/CBC, Mag, Phos in one week Please follow up on the following pending results:  Home Health: No  Equipment/Devices: None    Discharge Condition: Stable  CODE STATUS: FULL CODE Diet recommendation: Heart Healthy Diet   Brief/Interim Summary: Patient is an 80 year old Caucasian male with a past medical history significant for but not limited to CAD status post CABG 2016, history of heart failure with reduced ejection fraction which is improved and now has heart failure preserved ejection fraction with an EF of 50 to 55% back on echocardiogram done on 08/28/2021, aortic stenosis, atrial fibrillation on anticoagulation with Eliquis given CHA2DS2-VASc score of rhythm 4, history of VT status post ICD placement, history of DVT and PE in the past with PAD and aorto bifemoral past, history of left CEA, history of prior CVA, hypertension, hyperlipidemia, hypothyroidism, history of BPH with LUTS, history of vertigo and other comorbidities who has had several admissions in the past but recently diagnosed and admitted for respiratory failure secondary to decompensated heart failure.  He is recently diagnosed with COVID from 10/31/2021 until 11/01/2020 discharge 1113 he completed Paxil within 7 days of Ceftin.  He recently had medication changes by his cardiologist who decreased his Lasix and changed to bisoprolol secondary to hypotension.  He is evaluated 11/13/2021 and the ED sent home with no acute findings and read presented with near syncope and chest pain on 11/09/2021 with his defibrillator firing.  Work-up revealed A. fib without STEMI and a long QT  troponin trending flat borderline leukocytosis.  EP evaluated his ICD and he had no episodes since 1119 and no episodes explaining symptoms.  Patient was little bit dizzy but denied no more chest pain or chest discomfort or abdominal discomfort and he is moved his bowels and improving with belching.  His EKG was reviewed by the EP team and they recommended resuming amiodarone given his VT with a PT and ICD already in place.  PT OT evaluated and initially he dropped his blood pressure but was not orthostatic and repeat orthostatics done did show that his blood pressure did not drop.  He was deemed stable for discharge given his improvement in symptoms improvement in his chest and abdominal pain.  He states that he had a bowel movement which is improved.  He was deemed stable to be discharged home and follow-up with his PCP and cardiologist in the outpatient setting  Discharge Diagnoses:  Principal Problem:   Chest pain Active Problems:   Coronary artery disease   HTN (hypertension)   Carotid artery disease ([place])   PVD (peripheral vascular disease) ([place])   VT (ventricular tachycardia)  ?  Recurrent VT?  Chest pain, rule out recurrent V. tach and chest pain improved likely gastric in nature A. fib CHADS2 score >4 on Eliquis Troponin trend relatively flat EKG personally over read no STEMI atrial fibrillation Resume bisoprolol 2.5 daily-amiodarone on hold initially but resumed cardiology recommendations -Troponin trended up and went from 39 up to 41 and then trended up to 47 and last check was 62 but patient not have any more abdominal pain or chest pain Resume apixaban 5 twice daily -I spoke personally to the EP cardiology team  who recommends no inpatient further work-up at this time as patient's symptoms are resolved and recommends outpatient follow-up and discharge home  GI upset-chronic constipation, improved Patient states that he seems to have relief from belching and flatus but does not have  actual chest pain He has had diarrhea recently so I do not know if he is hypomagnesemic but he now states that he has been constipated and had a bowel movement yesterday and today He was given a bowel regimen at discharge  HFrEF with improvement HFpEF 55% 08/28/2021 CABG 2016 Aortic stenosis Continue bisoprolol 2.5 mg daily as above Holding at this time Aldactone 12.5 hold Jardiance 10 and Lasix 20 q. other day for now while he is hospitalized and resume at discharge Last hospital weight with 66 kg he is at the same weight today Appears euvolemic at the time of discharge  PAD, CEA Prior reported CVA in the past Outpatient surveillance and monitoring with continuing home medications  BPH with LUTS Resume finasteride 5 daily, Flomax 0.4 at bedtime Patient request to have indwelling Foley catheter because he has difficulty passing urine during hospitalization-he will need a voiding trial prior to discharge and I discontinued his Foley catheter today and he will go back to in and out caths at home  Hypothyroidism Continue levothyroxine 25 daily  Reflux/GERD/GI Prophylaxis Continue Protonix 40 daily  Anxiety Continue Xanax 0.5 3 times daily outpatient wean  Prediabetes A1c 6.09/22 Only monitor at this time no treatment discussed with outpatient  CKD stage IIIa -Stable.  Patient's BUNs/creatinine is 14/1.31 -Avoid further nephrotoxic medications, contrast dyes, hypotension renally dose medications -Repeat CMP within 1 week  Hyperbilirubinemia -Mild and likely reactive and patient's T bili improved and went from 1.8 and is now 0.9 -Continue monitor and trend in the outpatient setting repeat CMP within 1 week  Discharge Instructions  Discharge Instructions     AMB Referral to [place]   Complete by: As directed    [facility] Central Team:  [facility], Brigitte Pulse, Emily Filbert., MD does the [facility]   Please assign to [place] Coordinator for complex care  and disease management follow up calls and assess for further needs.  Questions please call:   [name], RN BSN [facility] Liaison  [phone] business mobile phone Toll free office [phone]  Fax number: [phone] [email or web address]   Reason for Referral: [facility] Disease [facility] ([employer] payers)   Disease managment services needed: Nurse Case Manager   Diagnoses of:  Hypertension Other     Other Diagnosis: Cardiac   Expected date of contact: Emergent - 3 Days   Call MD for:  difficulty breathing, headache or visual disturbances   Complete by: As directed    Call MD for:  extreme fatigue   Complete by: As directed    Call MD for:  hives   Complete by: As directed    Call MD for:  persistant dizziness or light-headedness   Complete by: As directed    Call MD for:  persistant nausea and vomiting   Complete by: As directed    Call MD for:  redness, tenderness, or signs of infection (pain, swelling, redness, odor or green/yellow discharge around incision site)   Complete by: As directed    Call MD for:  severe uncontrolled pain   Complete by: As directed    Call MD for:  temperature >100.4   Complete by: As directed    Diet - low sodium heart  healthy   Complete by: As directed    Discharge instructions   Complete by: As directed    You were cared for by a hospitalist during your hospital stay. If you have any questions about your discharge medications or the care you received while you were in the hospital after you are discharged, you can call the unit and ask to speak with the hospitalist on call if the hospitalist that took care of you is not available. Once you are discharged, your primary care physician will handle any further medical issues. Please note that NO REFILLS for any discharge medications will be authorized once you are discharged, as it is imperative that you return to your primary care  physician (or establish a relationship with a primary care physician if you do not have one) for your aftercare needs so that they can reassess your need for medications and monitor your lab values.  Follow up with PCP, Medical Cardiology,and EP Cardiology. Take all medications as prescribed. If symptoms change or worsen please return to the ED for evaluation   Discharge wound care:   Complete by: As directed    Keep clean and dry   Increase activity slowly   Complete by: As directed       Allergies as of 11/19/2021       Reactions   Zetia [ezetimibe] Other (See Comments)   Myalgias    Ace Inhibitors Other (See Comments)   Aspirin Other (See Comments)   Atorvastatin Other (See Comments)   Ezetimibe-simvastatin Other (See Comments)   Codeine Nausea And Vomiting       Unithroid [levothyroxine Sodium] Other (See Comments)   Caused blurry vision per pt        Medication List     TAKE these medications    acetaminophen 325 MG tablet Commonly known as: TYLENOL Take 2 tablets (650 mg total) by mouth every 6 (six) hours as needed for mild pain (or Fever >/= 101).   allopurinol 100 MG tablet Commonly known as: ZYLOPRIM Take 100 mg by mouth daily as needed (gout).   ALPRAZolam 0.5 MG tablet Commonly known as: XANAX Take 0.5 mg by mouth 3 (three) times daily as needed for anxiety or sleep.   amiodarone 200 MG tablet Commonly known as: PACERONE Take 1 tablet (200 mg total) by mouth daily.   apixaban 5 MG Tabs tablet Commonly known as: ELIQUIS Take 1 tablet (5 mg total) by mouth 2 (two) times daily.   bisoprolol 5 MG tablet Commonly known as: ZEBETA Take 0.5 tablets (2.5 mg total) by mouth daily.   empagliflozin 10 MG Tabs tablet Commonly known as: Jardiance Take 1 tablet (10 mg total) by mouth daily.   finasteride 5 MG tablet Commonly known as: PROSCAR Take 5 mg by mouth daily.   furosemide 20 MG tablet Commonly known as: LASIX Take 1 tablet (20 mg total) by  mouth every other day.   levothyroxine 25 MCG tablet Commonly known as: SYNTHROID Take 25 mcg by mouth daily.   metoCLOPramide 5 MG tablet Commonly known as: REGLAN Take 1 tablet (5 mg total) by mouth every 6 (six) hours as needed for nausea.   nitroGLYCERIN 0.4 MG SL tablet Commonly known as: NITROSTAT Place 1 tablet (0.4 mg total) under the tongue every 5 (five) minutes as needed for chest pain.   ondansetron 4 MG disintegrating tablet Commonly known as: ZOFRAN-ODT Take 4 mg by mouth every 6 (six) hours as needed for nausea/vomiting.   pantoprazole 40  MG tablet Commonly known as: PROTONIX Take 1 tablet (40 mg total) by mouth daily.   polyethylene glycol 17 g packet Commonly known as: MIRALAX / GLYCOLAX Take 17 g by mouth daily.   Repatha SureClick 742 MG/ML Soaj Generic drug: Evolocumab INJECT 1 PEN INTO THE SKIN EVERY 14 DAYS. What changed: See the new instructions.   senna-docusate 8.6-50 MG tablet Commonly known as: Senokot-S Take 1 tablet by mouth at bedtime.   spironolactone 25 MG tablet Commonly known as: ALDACTONE Take 0.5 tablets (12.5 mg total) by mouth daily.   tamsulosin 0.4 MG Caps capsule Commonly known as: FLOMAX Take 0.4 mg by mouth at bedtime.               Discharge Care Instructions  (From admission, onward)           Start     Ordered   11/19/21 0000  Discharge wound care:       Comments: Keep clean and dry   11/19/21 1424            Allergies  Allergen Reactions   Zetia [Ezetimibe] Other (See Comments)    Myalgias    Ace Inhibitors Other (See Comments)   Aspirin Other (See Comments)   Atorvastatin Other (See Comments)   Ezetimibe-Simvastatin Other (See Comments)   Codeine Nausea And Vomiting        Unithroid [Levothyroxine Sodium] Other (See Comments)    Caused blurry vision per pt    Consultations: Discussed the case with the EP cardiology team  Procedures/Studies: DG Chest 2 View  Result Date:  11/17/2021 CLINICAL DATA:  Shortness of breath.  Chest pain. EXAM: CHEST - 2 VIEW COMPARISON:  Two-view chest x-ray 11/13/2021 FINDINGS: Heart size is upper limits of normal. Atherosclerotic changes are noted at the aortic arch. Patient is status post median sternotomy for CABG. AICD in place. Lungs are clear. Left shoulder hemiarthroplasty noted. Surgical clips are present in the upper abdomen. IMPRESSION: No acute cardiopulmonary disease. Electronically Signed   By: San Morelle M.D.   On: 11/17/2021 14:40   DG Chest 2 View  Result Date: 11/13/2021 CLINICAL DATA:  Chest pain EXAM: CHEST - 2 VIEW COMPARISON:  Chest x-ray 10/27/2021 FINDINGS: Heart is mildly enlarged. Mediastinum appears stable. Cardiac surgical changes and median sternotomy wires. Left-sided cardiac pacemaker device. Pulmonary vasculature is normal. No focal consolidation identified. No pleural effusion or pneumothorax. IMPRESSION: Cardiomegaly with no acute process identified. Electronically Signed   By: Ofilia Neas M.D.   On: 11/13/2021 15:22   CT Angio Chest PE W/Cm &/Or Wo Cm  Result Date: 10/31/2021 CLINICAL DATA:  Body aches.  Elevated D-dimer.  COVID positive. EXAM: CT ANGIOGRAPHY CHEST WITH CONTRAST TECHNIQUE: Multidetector CT imaging of the chest was performed using the standard protocol during bolus administration of intravenous contrast. Multiplanar CT image reconstructions and MIPs were obtained to evaluate the vascular anatomy. CONTRAST:  42mL OMNIPAQUE IOHEXOL 350 MG/ML SOLN COMPARISON:  Chest x-ray dated October 27, 2021. CTA chest dated September 10, 2021. FINDINGS: Cardiovascular: Satisfactory opacification of the pulmonary arteries to the segmental level. No evidence of pulmonary embolism. Unchanged cardiomegaly. Prior CABG. No pericardial effusion. No thoracic aortic aneurysm. Coronary, aortic arch, and branch vessel atherosclerotic vascular disease. Unchanged left chest wall pacemaker. Mediastinum/Nodes:  No enlarged mediastinal, hilar, or axillary lymph nodes. Thyroid gland, trachea, and esophagus demonstrate no significant findings. Lungs/Pleura: Confluent ground-glass densities in the right lower lobe. No pleural effusion or pneumothorax. Upper Abdomen: No acute abnormality. Musculoskeletal:  No chest wall abnormality. No acute or significant osseous findings. Review of the MIP images confirms the above findings. IMPRESSION: 1. No evidence of pulmonary embolism. 2. Confluent ground-glass densities in the right lower lobe, consistent with pneumonia. 3. Aortic Atherosclerosis (ICD10-I70.0). Electronically Signed   By: Titus Dubin M.D.   On: 10/31/2021 18:14   DG Chest Portable 1 View  Result Date: 10/27/2021 CLINICAL DATA:  Cough, chest pain, shortness of breath EXAM: PORTABLE CHEST 1 VIEW COMPARISON:  Portable exam 1307 hours compared to 12/10/2021 FINDINGS: LEFT subclavian ICD with lead projecting over RIGHT ventricle. Normal heart size post CABG. Mediastinal contours and pulmonary vascularity normal. Atherosclerotic calcification aorta. Lungs clear. No acute infiltrate, pleural effusion, or pneumothorax. Bones demineralized with chronic RIGHT rotator cuff tear, LEFT shoulder prosthesis, and a fractures screw at the inferior LEFT glenoid. IMPRESSION: Post CABG and ICD. No acute abnormalities. Aortic Atherosclerosis (ICD10-I70.0). Electronically Signed   By: Lavonia Dana M.D.   On: 10/27/2021 13:20   VAS US CAROTID  Result Date: 10/29/2021 Carotid Arterial Duplex Study Patient Name:  Ryan [name] [facility]  Date of Exam:   10/29/2021 Medical Rec #: [id number]          Accession #:    [id number] Date of Birth: October 05, 1941          Patient Gender: M Patient Age:   48 years Exam Location:  [facility] Procedure:      VAS US CAROTID Referring Phys: Servando Snare --------------------------------------------------------------------------------  Indications:       Carotid artery disease, Syncope and left  endarterectomy. Risk Factors:      Hypertension, coronary artery disease, PAD. Other Factors:     CKD 3. Comparison Study:  CTA 10/01/2021                    R=60%, L=no significant stenosis Performing Technologist: Ronal Fear RVS, RCS  Examination Guidelines: A complete evaluation includes B-mode imaging, spectral Doppler, color Doppler, and power Doppler as needed of all accessible portions of each vessel. Bilateral testing is considered an integral part of a complete examination. Limited examinations for reoccurring indications may be performed as noted.  Right Carotid Findings: +----------+--------+--------+--------+------------------+--------+           PSV cm/sEDV cm/sStenosisPlaque DescriptionComments +----------+--------+--------+--------+------------------+--------+ CCA Prox  63      16              heterogenous               +----------+--------+--------+--------+------------------+--------+ CCA Mid   65      18              homogeneous                +----------+--------+--------+--------+------------------+--------+ CCA Distal47      10              heterogenous               +----------+--------+--------+--------+------------------+--------+ ICA Prox  172     44      40-59%  calcific                   +----------+--------+--------+--------+------------------+--------+ ICA Mid   166     38                                         +----------+--------+--------+--------+------------------+--------+ ICA Distal82  19                                         +----------+--------+--------+--------+------------------+--------+ ECA       202     21              calcific                   +----------+--------+--------+--------+------------------+--------+ +----------+--------+-------+----------------+-------------------+           PSV cm/sEDV cmsDescribe        Arm Pressure (mmHG)  +----------+--------+-------+----------------+-------------------+ [id number]             Multiphasic, WNL                    +----------+--------+-------+----------------+-------------------+ +---------+--------+--+--------+-+---------+ VertebralPSV cm/s47EDV cm/s7Antegrade +---------+--------+--+--------+-+---------+  Left Carotid Findings: +----------+--------+--------+--------+------------------+--------+           PSV cm/sEDV cm/sStenosisPlaque DescriptionComments +----------+--------+--------+--------+------------------+--------+ CCA Prox  69      11                                         +----------+--------+--------+--------+------------------+--------+ CCA Mid   71      15              homogeneous                +----------+--------+--------+--------+------------------+--------+ CCA Distal112     13              homogeneous                +----------+--------+--------+--------+------------------+--------+ ICA Prox  108     12      1-39%   homogeneous                +----------+--------+--------+--------+------------------+--------+ ICA Mid   58      11                                         +----------+--------+--------+--------+------------------+--------+ ICA Distal75      11                                         +----------+--------+--------+--------+------------------+--------+ ECA       72      9                                          +----------+--------+--------+--------+------------------+--------+ +----------+--------+--------+----------------+-------------------+           PSV cm/sEDV cm/sDescribe        Arm Pressure (mmHG) +----------+--------+--------+----------------+-------------------+ Subclavian109             Multiphasic, WNL                    +----------+--------+--------+----------------+-------------------+ +---------+--------+--+--------+--+---------+ VertebralPSV cm/s75EDV cm/s11Antegrade  +---------+--------+--+--------+--+---------+   Summary: Right Carotid: Velocities in the right ICA are consistent with a 40-59%                stenosis. Left Carotid: Velocities in the left ICA are consistent  with a 1-39% stenosis. Vertebrals:  Bilateral vertebral arteries demonstrate antegrade flow. Subclavians: Normal flow hemodynamics were seen in bilateral subclavian              arteries. *See table(s) above for measurements and observations.  Electronically signed by Servando Snare MD on 10/29/2021 at 3:58:57 PM.    Final      Subjective: Seen and examined at bedside and had no more chest pain or abdominal discomfort.  Felt well.  Had a little bit of dizziness but states that his dizziness is chronic and he takes medication for it at home.  Denies any nausea or vomiting.  Did well with physical therapy and did not desaturate and did not have any symptoms of orthostatic hypotension.  Felt well and was at his baseline ready to go home.  Discharge Exam: Vitals:   11/19/21 0748 11/19/21 1147  BP: 111/60 127/66  Pulse: (!) 55 60  Resp: 15 19  Temp: 98.4 F (36.9 C) 98 F (36.7 C)  SpO2: 94% 97%   Vitals:   11/18/21 2331 11/19/21 0336 11/19/21 0748 11/19/21 1147  BP: (!) 115/57 104/62 111/60 127/66  Pulse: (!) 59 61 (!) 55 60  Resp: 17 18 15 19   Temp: 97.6 F (36.4 C) 98.4 F (36.9 C) 98.4 F (36.9 C) 98 F (36.7 C)  TempSrc: Oral Oral Oral Oral  SpO2: 95% 96% 94% 97%  Weight:      Height:       General: Pt is alert, awake, not in acute distress Cardiovascular: RRR, S1/S2 +, no rubs, no gallops Respiratory: Diminished bilaterally, no wheezing, no rhonchi; unlabored breathing Abdominal: Soft, not tender to palpate, ND, bowel sounds + Extremities: No appreciable edema, no cyanosis  The results of significant diagnostics from this hospitalization (including imaging, microbiology, ancillary and laboratory) are listed below for reference.    Microbiology: Recent Results (from the  past 240 hour(s))  Resp Panel by RT-PCR (Flu A&B, Covid) Nasopharyngeal Swab     Status: None   Collection Time: 11/17/21  2:00 PM   Specimen: Nasopharyngeal Swab; Nasopharyngeal(NP) swabs in vial transport medium  Result Value Ref Range Status   SARS Coronavirus 2 by RT PCR NEGATIVE NEGATIVE Final    Comment: (NOTE) SARS-CoV-2 target nucleic acids are NOT DETECTED.  The SARS-CoV-2 RNA is generally detectable in upper respiratory specimens during the acute phase of infection. The lowest concentration of SARS-CoV-2 viral copies this assay can detect is 138 copies/mL. A negative result does not preclude SARS-Cov-2 infection and should not be used as the sole basis for treatment or other patient management decisions. A negative result may occur with  improper specimen collection/handling, submission of specimen other than nasopharyngeal swab, presence of viral mutation(s) within the areas targeted by this assay, and inadequate number of viral copies(<138 copies/mL). A negative result must be combined with clinical observations, patient history, and epidemiological information. The expected result is Negative.  Fact Sheet for Patients:  EntrepreneurPulse.com.au  Fact Sheet for Healthcare Providers:  IncredibleEmployment.be  This test is no t yet approved or cleared by the [place] FDA and  has been authorized for detection and/or diagnosis of SARS-CoV-2 by FDA under an Emergency Use Authorization (EUA). This EUA will remain  in effect (meaning this test can be used) for the duration of the COVID-19 declaration under Section 564(b)(1) of the Act, 21 U.S.C.section 360bbb-3(b)(1), unless the authorization is terminated  or revoked sooner.       Influenza A by PCR NEGATIVE  NEGATIVE Final   Influenza B by PCR NEGATIVE NEGATIVE Final    Comment: (NOTE) The Xpert Xpress SARS-CoV-2/FLU/RSV plus assay is intended as an aid in the diagnosis of  influenza from Nasopharyngeal swab specimens and should not be used as a sole basis for treatment. Nasal washings and aspirates are unacceptable for Xpert Xpress SARS-CoV-2/FLU/RSV testing.  Fact Sheet for Patients: EntrepreneurPulse.com.au  Fact Sheet for Healthcare Providers: IncredibleEmployment.be  This test is not yet approved or cleared by the [place] FDA and has been authorized for detection and/or diagnosis of SARS-CoV-2 by FDA under an Emergency Use Authorization (EUA). This EUA will remain in effect (meaning this test can be used) for the duration of the COVID-19 declaration under Section 564(b)(1) of the Act, 21 U.S.C. section 360bbb-3(b)(1), unless the authorization is terminated or revoked.  Performed at [facility], [address] [place], [place] [address]     Labs: BNP (last 3 results) Recent Labs    09/10/21 0205 09/11/21 0341 11/13/21 1443  BNP 852.2* 853.2* 229.7*   Basic Metabolic Panel: Recent Labs  Lab 11/17/21 1358 11/18/21 0748 11/19/21 0103  NA 141 137 137  K 4.4 4.5 4.1  CL 105 105 105  CO2 26 25 26   GLUCOSE 150* 102* 113*  BUN 22 14 14   CREATININE 1.25* 1.26* 1.31*  CALCIUM 9.1 8.4* 8.5*  MG  --   --  2.1   Liver Function Tests: Recent Labs  Lab 11/18/21 0748 11/19/21 0103  AST 26 20  ALT 18 15  ALKPHOS 52 44  BILITOT 1.8* 0.9  PROT 5.7* 5.4*  ALBUMIN 3.0* 2.7*   No results for input(s): LIPASE, AMYLASE in the last 168 hours.  No results for input(s): AMMONIA in the last 168 hours. CBC: Recent Labs  Lab 11/17/21 1358 11/18/21 0748 11/19/21 0103  WBC 10.9* 6.5 6.6  NEUTROABS  --  4.6 4.4  HGB 14.4 13.8 13.1  HCT 44.3 41.4 39.5  MCV 99.1 97.9 97.8  PLT 246 231 199   Cardiac Enzymes: No results for input(s): CKTOTAL, CKMB, CKMBINDEX, TROPONINI in the last 168 hours. BNP: Invalid input(s): POCBNP CBG: No results for input(s): GLUCAP in the last 168  hours. D-Dimer No results for input(s): DDIMER in the last 72 hours. Hgb A1c No results for input(s): HGBA1C in the last 72 hours. Lipid Profile No results for input(s): CHOL, HDL, LDLCALC, TRIG, CHOLHDL, LDLDIRECT in the last 72 hours. Thyroid function studies No results for input(s): TSH, T4TOTAL, T3FREE, THYROIDAB in the last 72 hours.  Invalid input(s): FREET3 Anemia work up No results for input(s): VITAMINB12, FOLATE, FERRITIN, TIBC, IRON, RETICCTPCT in the last 72 hours. Urinalysis    Component Value Date/Time   COLORURINE YELLOW 10/31/2021 2008   APPEARANCEUR CLEAR 10/31/2021 2008   LABSPEC 1.045 (H) 10/31/2021 2008   PHURINE 7.0 10/31/2021 2008   GLUCOSEU >1,000 (A) 10/31/2021 2008   HGBUR NEGATIVE 10/31/2021 2008   [place] NEGATIVE 10/31/2021 2008   KETONESUR 15 (A) 10/31/2021 2008   PROTEINUR TRACE (A) 10/31/2021 2008   UROBILINOGEN 1.0 04/04/2015 2320   NITRITE NEGATIVE 10/31/2021 2008   LEUKOCYTESUR NEGATIVE 10/31/2021 2008   Sepsis Labs Invalid input(s): PROCALCITONIN,  WBC,  LACTICIDVEN Microbiology Recent Results (from the past 240 hour(s))  Resp Panel by RT-PCR (Flu A&B, Covid) Nasopharyngeal Swab     Status: None   Collection Time: 11/17/21  2:00 PM   Specimen: Nasopharyngeal Swab; Nasopharyngeal(NP) swabs in vial transport medium  Result Value Ref Range Status  SARS Coronavirus 2 by RT PCR NEGATIVE NEGATIVE Final    Comment: (NOTE) SARS-CoV-2 target nucleic acids are NOT DETECTED.  The SARS-CoV-2 RNA is generally detectable in upper respiratory specimens during the acute phase of infection. The lowest concentration of SARS-CoV-2 viral copies this assay can detect is 138 copies/mL. A negative result does not preclude SARS-Cov-2 infection and should not be used as the sole basis for treatment or other patient management decisions. A negative result may occur with  improper specimen collection/handling, submission of specimen other than  nasopharyngeal swab, presence of viral mutation(s) within the areas targeted by this assay, and inadequate number of viral copies(<138 copies/mL). A negative result must be combined with clinical observations, patient history, and epidemiological information. The expected result is Negative.  Fact Sheet for Patients:  EntrepreneurPulse.com.au  Fact Sheet for Healthcare Providers:  IncredibleEmployment.be  This test is no t yet approved or cleared by the [place] FDA and  has been authorized for detection and/or diagnosis of SARS-CoV-2 by FDA under an Emergency Use Authorization (EUA). This EUA will remain  in effect (meaning this test can be used) for the duration of the COVID-19 declaration under Section 564(b)(1) of the Act, 21 U.S.C.section 360bbb-3(b)(1), unless the authorization is terminated  or revoked sooner.       Influenza A by PCR NEGATIVE NEGATIVE Final   Influenza B by PCR NEGATIVE NEGATIVE Final    Comment: (NOTE) The Xpert Xpress SARS-CoV-2/FLU/RSV plus assay is intended as an aid in the diagnosis of influenza from Nasopharyngeal swab specimens and should not be used as a sole basis for treatment. Nasal washings and aspirates are unacceptable for Xpert Xpress SARS-CoV-2/FLU/RSV testing.  Fact Sheet for Patients: EntrepreneurPulse.com.au  Fact Sheet for Healthcare Providers: IncredibleEmployment.be  This test is not yet approved or cleared by the [place] FDA and has been authorized for detection and/or diagnosis of SARS-CoV-2 by FDA under an Emergency Use Authorization (EUA). This EUA will remain in effect (meaning this test can be used) for the duration of the COVID-19 declaration under Section 564(b)(1) of the Act, 21 U.S.C. section 360bbb-3(b)(1), unless the authorization is terminated or revoked.  Performed at [facility], [address] [place], [place] [address]    Time coordinating discharge: 35 minutes  SIGNED:  Kerney Elbe, DO [facility] 11/20/2021, 9:21 PM Pager is on [place]  If 7PM-7AM, please contact night-coverage [email or web address]

## 2021-11-19 NOTE — Evaluation (Signed)
Physical Therapy Evaluation Patient Details Name: Ryan [name] MRN: [id number] DOB: Sep 22, 1941 Today's Date: 11/19/2021  History of Present Illness  Patient is an 80 y/o male admitted due to chest pain, palpitations, and near syncope.  PMH positive for CAD, CABG 2016, CHFm AS, A-fib on Eliquis, VT s/p ICD, h/o DVT/PE, PAD s/p aortobifem bypass, L CEA, and recent COVID infection.  Clinical Impression  Patient presents with mobility close to baseline.  Some generalized weakness due to hospitalization and not eating well.  Patient able to walk the unit with VSS and occasional minguard for balance with HHA versus no device.  Did not use device at home, but has walker if needed.  Wife able to support as well.  Pt stable for home without follow up PT needs at this time.  Encouraged slow return to activity.  PT signing off.        Recommendations for follow up therapy are one component of a multi-disciplinary discharge planning process, led by the attending physician.  Recommendations may be updated based on patient status, additional functional criteria and insurance authorization.  Follow Up Recommendations No PT follow up    Assistance Recommended at Discharge Intermittent Supervision/Assistance  Functional Status Assessment Patient has had a recent decline in their functional status and demonstrates the ability to make significant improvements in function in a reasonable and predictable amount of time.  Equipment Recommendations  None recommended by PT    Recommendations for Other Services       Precautions / Restrictions Precautions Precautions: Fall Restrictions Weight Bearing Restrictions: No      Mobility  Bed Mobility Overal bed mobility: Modified Independent             General bed mobility comments: OOB upon entry    Transfers Overall transfer level: Needs assistance Equipment used: None Transfers: Sit to/from Stand Sit to Stand: Supervision            General transfer comment: for lines from EOB    Ambulation/Gait Ambulation/Gait assistance: Min guard Gait Distance (Feet): 300 Feet Assistive device: None;1 person hand held assist Gait Pattern/deviations: Step-through pattern;Decreased stride length       General Gait Details: shortened stride length with some initial unsteadiness with HHA given, improved with distance  Stairs            Wheelchair Mobility    Modified Rankin (Stroke Patients Only)       Balance Overall balance assessment: Mild deficits observed, not formally tested                                           Pertinent Vitals/Pain Pain Assessment: Faces Faces Pain Scale: Hurts little more Pain Location: abdomen Pain Descriptors / Indicators: Discomfort Pain Intervention(s): Monitored during session    Home Living Family/patient expects to be discharged to:: Private residence Living Arrangements: Spouse/significant other Available Help at Discharge: Family;Available 24 hours/day Type of Home: House Home Access: Stairs to enter Entrance Stairs-Rails: Right Entrance Stairs-Number of Steps: 2   Home Layout: Two level;Able to live on main level with bedroom/bathroom Home Equipment: Rolling Walker (2 wheels)      Prior Function Prior Level of Function : Independent/Modified Independent               ADLs Comments: driving     Hand Dominance   Dominant Hand: Right    Extremity/Trunk Assessment  Upper Extremity Assessment Upper Extremity Assessment: Defer to OT evaluation    Lower Extremity Assessment Lower Extremity Assessment: Overall WFL for tasks assessed       Communication   Communication: HOH  Cognition Arousal/Alertness: Awake/alert Behavior During Therapy: WFL for tasks assessed/performed Overall Cognitive Status: Within Functional Limits for tasks assessed                                          General Comments       Exercises     Assessment/Plan    PT Assessment Patient does not need any further PT services  PT Problem List         PT Treatment Interventions      PT Goals (Current goals can be found in the Care Plan section)  Acute Rehab PT Goals PT Goal Formulation: All assessment and education complete, DC therapy    Frequency     Barriers to discharge        Co-evaluation               AM-PAC PT "6 Clicks" Mobility  Outcome Measure Help needed turning from your back to your side while in a flat bed without using bedrails?: None Help needed moving from lying on your back to sitting on the side of a flat bed without using bedrails?: None Help needed moving to and from a bed to a chair (including a wheelchair)?: None Help needed standing up from a chair using your arms (e.g., wheelchair or bedside chair)?: None Help needed to walk in hospital room?: A Little Help needed climbing 3-5 steps with a railing? : A Little 6 Click Score: 22    End of Session   Activity Tolerance: Patient tolerated treatment well Patient left: in chair;with call bell/phone within reach   PT Visit Diagnosis: Other abnormalities of gait and mobility (R26.89);Muscle weakness (generalized) (M62.81)    Time: 0881-1031 PT Time Calculation (min) (ACUTE ONLY): 25 min   Charges:   PT Evaluation $PT Eval Low Complexity: 1 Low PT Treatments $Gait Training: 8-22 mins        Magda Kiel, PT Acute Rehabilitation Services RXYVO:[phone] Office:[phone] 11/19/2021   Reginia Naas 11/19/2021, 2:27 PM

## 2021-11-19 NOTE — TOC Transition Note (Signed)
Transition of Care [facility]) - CM/SW Discharge Note   Patient Details  Name: Ryan [name] MRN: [id number] Date of Birth: [birth year]  [facility] CM/SW Contact:  Zenon Mayo, RN Phone Number: 11/19/2021, 2:45 PM   Clinical Narrative:    Patient is for dc home, wife will transport him home, he has no needs.   Final next level of care: Home/Self Care Barriers to Discharge: No Barriers Identified   Patient Goals and CMS Choice Patient states their goals for this hospitalization and ongoing recovery are:: return home   Choice offered to / list presented to : NA  Discharge Placement                       Discharge Plan and Services   Discharge Planning Services: CM Consult Post Acute Care Choice: NA            DME Agency: NA       HH Arranged: NA          Social Determinants of Health (SDOH) Interventions     Readmission Risk Interventions Readmission Risk Prevention Plan 11/19/2021  Transportation Screening Complete  PCP or Specialist Appt within 3-5 Days Complete  HRI or [place] Complete  Social Work Consult for [place] Planning/Counseling Complete  Palliative Care Screening Not Applicable  Medication Review [employer]) Complete  Some recent data might be hidden

## 2021-11-21 ENCOUNTER — Other Ambulatory Visit: Payer: Self-pay

## 2021-11-21 ENCOUNTER — Ambulatory Visit (INDEPENDENT_AMBULATORY_CARE_PROVIDER_SITE_OTHER): Payer: Medicare Other | Admitting: Orthopaedic Surgery

## 2021-11-21 ENCOUNTER — Other Ambulatory Visit: Payer: Self-pay | Admitting: *Deleted

## 2021-11-21 ENCOUNTER — Encounter: Payer: Self-pay | Admitting: Orthopaedic Surgery

## 2021-11-21 ENCOUNTER — Ambulatory Visit (INDEPENDENT_AMBULATORY_CARE_PROVIDER_SITE_OTHER): Payer: Medicare Other

## 2021-11-21 VITALS — BP 147/74 | Ht 66.0 in | Wt 142.0 lb

## 2021-11-21 DIAGNOSIS — G8929 Other chronic pain: Secondary | ICD-10-CM

## 2021-11-21 DIAGNOSIS — M25511 Pain in right shoulder: Secondary | ICD-10-CM

## 2021-11-21 DIAGNOSIS — I6523 Occlusion and stenosis of bilateral carotid arteries: Secondary | ICD-10-CM

## 2021-11-21 DIAGNOSIS — M75121 Complete rotator cuff tear or rupture of right shoulder, not specified as traumatic: Secondary | ICD-10-CM

## 2021-11-21 NOTE — Progress Notes (Signed)
Office Visit Note   Patient: Ryan [name]           Date of Birth: 01-08-1941           MRN: [id number] Visit Date: 11/21/2021              Requested by: Ginger Organ., MD [address] [place],  [place] [address] PCP: Ginger Organ., MD   Assessment & Plan: Visit Diagnoses:  1. Chronic right shoulder pain   2. Complete tear of right rotator cuff, unspecified whether traumatic     Plan: Reviewed x-rays with patient and he understands his full-thickness rotator cuff tear likely.  He had trouble with his arm for years long as he uses a download he should do fine.  He could try some Salonpas topical for his shoulder.  He states if he gets really bad he has medicine he could take for pain or could use Tylenol.  He has increased symptoms with impingement he can return for an injection.  Follow-Up Instructions: Return if symptoms worsen or fail to improve.   Orders:  Orders Placed This Encounter  Procedures   XR Shoulder Right   No orders of the defined types were placed in this encounter.     Procedures: No procedures performed   Clinical Data: No additional findings.   Subjective: Chief Complaint  Patient presents with   Right Shoulder - Pain    HPI 80 year old male long-term patient of our practice had total hips done by Dr. Ninfa Linden.  I did his back surgery lumbar.  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  Sometimes he has trouble lifting his arm other times he is able to do it.  Today get his arm over his head.  He states take pain pill whenever it is really painful.  He had pulmonary artery disease coronary bypass surgery x4 and Is on Eliquis.  Previous injection given good relief for many months.  Currently is not painful enough for him.  He is unable to use anti-inflammatories since he is on Eliquis. Review of Systems All other systems noncontributory to HPI.  Objective: Vital Signs: BP (!) 147/74   Ht 5\' 6"   (1.676 m)   Wt 142 lb (64.4 kg)   BMI 22.92 kg/m   Physical Exam Constitutional:      Appearance: He is well-developed.  HENT:     Head: Normocephalic and atraumatic.     Right Ear: External ear normal.     Left Ear: External ear normal.  Eyes:     Pupils: Pupils are equal, round, and reactive to light.  Neck:     Thyroid: No thyromegaly.     Trachea: No tracheal deviation.  Cardiovascular:     Rate and Rhythm: Normal rate.  Pulmonary:     Effort: Pulmonary effort is normal.     Breath sounds: No wheezing.  Abdominal:     General: Bowel sounds are normal.     Palpations: Abdomen is soft.  Musculoskeletal:     Cervical back: Neck supple.  Skin:    General: Skin is warm and dry.     Capillary Refill: Capillary refill takes less than 2 seconds.  Neurological:     Mental Status: He is alert and oriented to person, place, and time.  Psychiatric:        Behavior: Behavior normal.        Thought Content: Thought content normal.  Judgment: Judgment normal.    Ortho Exam positive impingement and get his arm up over his head supraspinatus weakness.  Specialty Comments:  No specialty comments available.  Imaging: XR Shoulder Right  Result Date: 11/21/2021 2 view x-rays right shoulder demonstrates high riding head without glenohumeral arthropathy consistent with full-thickness supraspinatus tear. Impression: High riding humeral head consistent with supraspinatus tear.    PMFS History: Patient Active Problem List   Diagnosis Date Noted   Complete tear of right rotator cuff 11/21/2021   VT (ventricular tachycardia) 11/18/2021   Orthostatic hypotension 11/12/2021   Nausea 11/12/2021   Aortic stenosis 11/11/2021   Right lower lobe pneumonia 11/01/2021   Prolonged QT interval 11/01/2021   Acquired thrombophilia ([place]) 11/01/2021   Aortic atherosclerosis ([place]) 11/01/2021   Intractable nausea and vomiting 09/10/2021   Hypoxemia    Status post implantation of automatic  cardioverter/defibrillator (AICD) 03/22/2021   Ventricular tachycardia 12/28/2020   (HFpEF) heart failure with preserved ejection fraction ([place]) 12/28/2020   NSTEMI (non-ST elevated myocardial infarction) ([place])    Acute respiratory failure with hypoxia ([place])    S/P total hip arthroplasty 08/16/2019   Unilateral primary osteoarthritis, right hip 07/18/2019   Persistent atrial fibrillation ([place]) 01/27/2019   Dizziness 01/27/2019   History of pneumonia 01/27/2019   Stage 3a chronic kidney disease (CKD) ([place]) 01/27/2019   Chest pain 02/07/2018   Elevated troponin 03/23/2017   Sigmoid diverticulitis 03/23/2017   Wrist pain, acute, right    Subclinical hypothyroidism    Dyspnea 02/26/2017   Shoulder blade pain 02/26/2017   SOB (shortness of breath) 02/26/2017   Chronic right shoulder pain    Hyperlipidemia LDL goal <70 01/20/2017   Chronic cholecystitis 07/27/2016   PAD (peripheral artery disease) ([place]) 07/09/2015   S/P CABG x 4 04/05/2015   Atypical chest pain 03/20/2015   Carotid artery disease ([place]) 10/09/2014   PVD (peripheral vascular disease) ([place]) 10/09/2014   HTN (hypertension) 05/30/2014   Arthritis of left hip 01/09/2014   Status post THR (total hip replacement) 01/09/2014   Spinal stenosis, lumbar 01/06/2013    Class: Diagnosis of   Coronary artery disease 01/05/2013   Atherosclerosis of native artery of extremity with intermittent claudication ([place]) 10/18/2012   Past Medical History:  Diagnosis Date   Acquired thrombophilia ([place]) 11/01/2021   Arthritis    CAD (coronary artery disease)    Carotid artery occlusion    Cataract    Bil eyes/worse in left eye   CHF (congestive heart failure) ([place])    Chronic back pain    COVID-19 10/31/2021   DVT (deep venous thrombosis) (H[facility])    Dysrhythmia    Enlarged prostate    takes Rapaflo daily   GERD (gastroesophageal reflux disease)    occasional   History of colon polyps    History of gout    has colchicine prn    History of kidney stones    Hyperlipidemia    takes Crestor daily   Hypertension    takes Amlodipine daily   Hypothyroidism    Myocardial infarction [facility])    Peripheral vascular disease (HCC)    Prolonged QT interval 11/01/2021   Pulmonary emboli ([place]) 03/20/2015   elevated d-dimer, intermediate V/Q study, atypical chest pain and SOB. Start on Xarelto 20mg  BID for 3 month   Rapid atrial fibrillation [employer])    Renal insufficiency    Shortness of breath dyspnea    Urinary frequency    Urinary urgency     Family History  Problem Relation Age of Onset   Heart disease Father    Heart attack Father    Heart disease Sister    Hypertension Sister    Heart attack Sister    Hypertension Mother    Diabetes Son     Past Surgical History:  Procedure Laterality Date   APPENDECTOMY     BACK SURGERY     5 times   big toe surgery     CARDIAC CATHETERIZATION     2010    dr Acie Fredrickson   cataract surgery     left eye   CHOLECYSTECTOMY N/A 07/27/2016   Procedure: LAPAROSCOPIC CHOLECYSTECTOMY;  Surgeon: Mickeal Skinner, MD;  Location: [facility];  Service: General;  Laterality: N/A;   COLONOSCOPY     CORONARY ARTERY BYPASS GRAFT N/A 04/05/2015   Procedure: CORONARY ARTERY BYPASS GRAFTING (CABG)X4 LIMA-LAD; SVG-DIAG1-DIAG2; SVG-PD;  Surgeon: [name], MD;  Location: [place];  Service: Open Heart Surgery;  Laterality: N/A;   CORONARY/GRAFT ANGIOGRAPHY N/A 04/22/2018   Procedure: CORONARY/GRAFT ANGIOGRAPHY;  Surgeon: Martinique, Peter M, MD;  Location: [place] CV LAB;  Service: Cardiovascular;  Laterality: N/A;   CYSTOSCOPY     ENDARTERECTOMY Left 04/24/2016   Procedure: ENDARTERECTOMY LEFT CAROTID;  Surgeon: Mal Misty, MD;  Location: [facility];  Service: Vascular;  Laterality: Left;   EYE SURGERY     FEMORAL ARTERY - POPLITEAL ARTERY BYPASS GRAFT     ICD IMPLANT N/A 12/30/2020   Procedure: ICD IMPLANT;  Surgeon: Evans Lance, MD;  Location: [place] CV [facility];  Service: Cardiovascular;   Laterality: N/A;   JOINT REPLACEMENT     shoulder   LEFT HEART CATHETERIZATION WITH CORONARY ANGIOGRAM N/A 04/03/2015   Procedure: LEFT HEART CATHETERIZATION WITH CORONARY ANGIOGRAM;  Surgeon: Troy Sine, MD;  Location: [facility] CATH LAB;  Service: Cardiovascular;  Laterality: N/A;   LUMBAR LAMINECTOMY  01/06/2013   Procedure: MICRODISCECTOMY LUMBAR LAMINECTOMY;  Surgeon: Marybelle Killings, MD;  Location: [facility];  Service: Orthopedics;  Laterality: N/A;  L3-4 decompression   LUMBAR LAMINECTOMY/DECOMPRESSION MICRODISCECTOMY  02/12/2012   Procedure: LUMBAR LAMINECTOMY/DECOMPRESSION MICRODISCECTOMY;  Surgeon: Floyce Stakes, MD;  Location: [place] [facility]O [facility];  Service: Neurosurgery;  Laterality: N/A;  Lumbar four-five laminectomy   PATCH ANGIOPLASTY Left 04/24/2016   Procedure: LEFT CAROTID ARTERY PATCH ANGIOPLASTY;  Surgeon: Mal Misty, MD;  Location: [facility];  Service: Vascular;  Laterality: Left;   RIGHT HEART CATH AND CORONARY/GRAFT ANGIOGRAPHY N/A 01/30/2019   Procedure: RIGHT HEART CATH AND CORONARY/GRAFT ANGIOGRAPHY;  Surgeon: Troy Sine, MD;  Location: [place] CV LAB;  Service: Cardiovascular;  Laterality: N/A;   STERIOD INJECTION Right 01/09/2014   Procedure: STEROID INJECTION;  Surgeon: Mcarthur Rossetti, MD;  Location: [facility];  Service: Orthopedics;  Laterality: Right;   TEE WITHOUT CARDIOVERSION N/A 04/05/2015   Procedure: TRANSESOPHAGEAL ECHOCARDIOGRAM (TEE);  Surgeon: [name], MD;  Location: [facility];  Service: Open Heart Surgery;  Laterality: N/A;   TOTAL HIP ARTHROPLASTY Left 01/09/2014   DR Ninfa Linden   TOTAL HIP ARTHROPLASTY Left 01/09/2014   Procedure: LEFT TOTAL HIP ARTHROPLASTY ANTERIOR APPROACH and Steroid Injection Right hip;  Surgeon: Mcarthur Rossetti, MD;  Location: [facility];  Service: Orthopedics;  Laterality: Left;   TOTAL HIP ARTHROPLASTY Right 08/15/2019   TOTAL HIP ARTHROPLASTY Right 08/15/2019   Procedure: RIGHT TOTAL HIP ARTHROPLASTY ANTERIOR APPROACH;   Surgeon: Mcarthur Rossetti, MD;  Location: [facility];  Service: Orthopedics;  Laterality: Right;  Social History   Occupational History   Not on file  Tobacco Use   Smoking status: Former    Types: Cigarettes    Quit date: 02/04/1987    Years since quitting: 34.8   Smokeless tobacco: Former    Types: Chew    Quit date: 07/20/2009   Tobacco comments:    quit 35+yrs ago  Vaping Use   Vaping Use: Never used  Substance and Sexual Activity   Alcohol use: No    Alcohol/week: 0.0 standard drinks   Drug use: No   Sexual activity: Not Currently

## 2021-11-21 NOTE — Patient Outreach (Signed)
Tirrell Buchberger [facility]) Care Management  11/21/2021  JONAVAN VANHORN December 10, 1941 [id number]   Telephone Assessment-Unsuccessful  RN attempted outreach call today however unsuccessful. RN able to leave a HIPAA approved voice message on pt's mobile device requesting a call back.  Will schedule another outreach call over the next week.  [name], RN Care Management Coordinator [place] Office [phone]

## 2021-11-24 ENCOUNTER — Encounter (HOSPITAL_BASED_OUTPATIENT_CLINIC_OR_DEPARTMENT_OTHER): Payer: Self-pay

## 2021-11-24 ENCOUNTER — Other Ambulatory Visit: Payer: Self-pay

## 2021-11-24 ENCOUNTER — Emergency Department (HOSPITAL_BASED_OUTPATIENT_CLINIC_OR_DEPARTMENT_OTHER)
Admission: EM | Admit: 2021-11-24 | Discharge: 2021-11-24 | Disposition: A | Discharge status: Home / Self Care | Payer: Medicare Other | Attending: Emergency Medicine | Admitting: Emergency Medicine

## 2021-11-24 DIAGNOSIS — Z96643 Presence of artificial hip joint, bilateral: Secondary | ICD-10-CM | POA: Diagnosis not present

## 2021-11-24 DIAGNOSIS — K5900 Constipation, unspecified: Secondary | ICD-10-CM | POA: Insufficient documentation

## 2021-11-24 DIAGNOSIS — Z79899 Other long term (current) drug therapy: Secondary | ICD-10-CM | POA: Insufficient documentation

## 2021-11-24 DIAGNOSIS — Z87891 Personal history of nicotine dependence: Secondary | ICD-10-CM | POA: Diagnosis not present

## 2021-11-24 DIAGNOSIS — Z955 Presence of coronary angioplasty implant and graft: Secondary | ICD-10-CM | POA: Diagnosis not present

## 2021-11-24 DIAGNOSIS — Z7901 Long term (current) use of anticoagulants: Secondary | ICD-10-CM | POA: Insufficient documentation

## 2021-11-24 DIAGNOSIS — I509 Heart failure, unspecified: Secondary | ICD-10-CM | POA: Diagnosis not present

## 2021-11-24 DIAGNOSIS — I2510 Atherosclerotic heart disease of native coronary artery without angina pectoris: Secondary | ICD-10-CM | POA: Diagnosis not present

## 2021-11-24 DIAGNOSIS — K219 Gastro-esophageal reflux disease without esophagitis: Secondary | ICD-10-CM | POA: Insufficient documentation

## 2021-11-24 DIAGNOSIS — Z8616 Personal history of COVID-19: Secondary | ICD-10-CM | POA: Diagnosis not present

## 2021-11-24 DIAGNOSIS — I110 Hypertensive heart disease with heart failure: Secondary | ICD-10-CM | POA: Insufficient documentation

## 2021-11-24 DIAGNOSIS — E039 Hypothyroidism, unspecified: Secondary | ICD-10-CM | POA: Diagnosis not present

## 2021-11-24 DIAGNOSIS — R109 Unspecified abdominal pain: Secondary | ICD-10-CM | POA: Insufficient documentation

## 2021-11-24 DIAGNOSIS — I4891 Unspecified atrial fibrillation: Secondary | ICD-10-CM | POA: Diagnosis not present

## 2021-11-24 LAB — COMPREHENSIVE METABOLIC PANEL
ALT: 26 U/L (ref 0–44)
AST: 28 U/L (ref 15–41)
Albumin: 4 g/dL (ref 3.5–5.0)
Alkaline Phosphatase: 65 U/L (ref 38–126)
Anion gap: 9 (ref 5–15)
BUN: 21 mg/dL (ref 8–23)
CO2: 28 mmol/L (ref 22–32)
Calcium: 9.3 mg/dL (ref 8.9–10.3)
Chloride: 104 mmol/L (ref 98–111)
Creatinine, Ser: 1.1 mg/dL (ref 0.61–1.24)
GFR, Estimated: >60 mL/min (ref >60)
Glucose, Bld: 69 mg/dL — ABNORMAL LOW (ref 70–99)
Potassium: 4 mmol/L (ref 3.5–5.1)
Sodium: 141 mmol/L (ref 135–145)
Total Bilirubin: 0.8 mg/dL (ref 0.3–1.2)
Total Protein: 6.6 g/dL (ref 6.5–8.1)

## 2021-11-24 LAB — CBC
HCT: 44 % (ref 39.0–52.0)
Hemoglobin: 14.5 g/dL (ref 13.0–17.0)
MCH: 32.3 pg (ref 26.0–34.0)
MCHC: 33 g/dL (ref 30.0–36.0)
MCV: 98 fL (ref 80.0–100.0)
Platelets: 251 10*3/uL (ref 150–400)
RBC: 4.49 MIL/uL (ref 4.22–5.81)
RDW: 13.8 % (ref 11.5–15.5)
WBC: 9.4 10*3/uL (ref 4.0–10.5)
nRBC: 0 % (ref 0.0–0.2)

## 2021-11-24 LAB — LIPASE, BLOOD: Lipase: 19 U/L (ref 11–51)

## 2021-11-24 NOTE — ED Notes (Signed)
Two unsuccessful blood draw attempts

## 2021-11-24 NOTE — Progress Notes (Signed)
Cardiology Office Note:    Date:  11/25/2021   ID:  Ryan [name], DOB 03-28-1941, MRN [id number]  PCP:  Ginger Organ., MD   [facility] Providers Cardiologist:  Mertie Moores, MD Cardiology APP:  Liliane Shi, PA-C  Electrophysiologist:  Cristopher Peru, MD     Referring MD: Ginger Organ., MD   Chief Complaint:  F/u for CHF, AF, CAD    Patient Profile:   Ryan [name] is a 80 y.o. male with:  Coronary artery disease  S/p CABG in 2016 Cath in 2020: 1/3 grafts patent - med Rx Paroxysmal atrial fibrillation (HFpEF) heart failure with preserved ejection fraction  Echocardiogram 01/2019: EF 50-55 Echocardiogram 12/2020: EF 35-40 Echocardiogram 9/22: EF 50-55 Ventricular tachycardia Admx 12/2020 with monomorphic VT; EF 35-40 S/p ICD Amiodarone Rx Aortic stenosis Echocardiogram 9/22: mod AS (?paradoxical LFLG mod AS) - mean 6.5, V-max 171.75 cm/s, DI 0.31 Hx of DVT/pulmonary embolism - chronic anticoagulation  Peripheral arterial disease  S/p Ao-Bifem bypass (Dr. Donzetta Matters) Carotid artery disease S/p L CEA Hx of CVA BPH Chronic kidney disease  Hypertension  Hyperlipidemia  Hypothyroidism  Prolonged QT Chronic dizziness   History of Present Illness: [name] was last seen 11/12/2021.   He had had several admissions with decompensated heart failure.  His blood pressures were low at 1 point requiring Midodrine.  His blood pressure and this was ultimately stopped.  His bisoprolol has been changed to carvedilol.  He had been having significant issues with dizziness and nausea.  He developed COVID-19 in November with associated pneumonia.  He responded quickly to therapy and had a fairly benign course.  He had an episode of rapid atrial fibrillation progressing to ventricular tachycardia requiring ATP from his device.  Of note, this was in the setting of stopping his carvedilol.  This was resumed at home.  When I saw him 11/12/2021, he noted multiple symptoms.   He had evidence of orthostatic hypotension both in our office as well as his primary care doctor's office.  I changed his carvedilol back to bisoprolol.  I also reduced his furosemide to every other day.  He noted significant issues with constipation and I referred him to gastroenterology.  He went to the emergency room with ongoing symptoms 11/13/2021.  He had minimally elevated troponins without significant trend.  He was given Reglan with improved symptoms.  He was discharged home.  He was then admitted 11/28-11/30 with near syncope and chest pain.  There was concern he had recurrent VT.  His device was interrogated.  He had no further episodes since the episode on 11/19 requiring ATP.  Recommendation was to continue amiodarone.  He had minimally elevated troponins without significant trend.  He returns for follow-up.  He is here today with his wife.  He was actually back at the emergency room yesterday with abdominal pain.  He was told to take MiraLAX 3 times a day.  He had a bowel movement this morning.  He feels much better today.  He notes all of his symptoms seem to stem from constipation.  He has not had exertional chest pain.  He has not had significant shortness of breath.  He has not had orthopnea, leg edema, syncope.  He sees gastroenterology soon.  He stopped taking empagliflozin.  His nausea significantly improved with this.  ASSESSMENT & PLAN:   (HFpEF) heart failure with preserved ejection fraction ([place]) He has had variable ejection fractions in the past.  He was admitted  in January of this year with monomorphic VT.  At that time, his EF was down to 35-40%.  During his hospitalization in September, his EF was back to 50-55%.  I saw him 2 weeks ago with significant symptoms of dizziness, nausea.  He had evidence of orthostatic hypotension.  I reduced his furosemide to every other day.  I also changed his carvedilol back to bisoprolol.  He stopped taking empagliflozin due to nausea.  He has  had significant improvement in his symptoms with regular bowel movements.  He sees GI soon.  At this point, I will not make any further changes in his medical regimen.  Once his GI situation is improved, we can attempt to adjust GDMT again.  I will see him back again in 2 months.  Aortic stenosis Echocardiogram in September suggested moderate aortic stenosis and probable paradoxical low-flow low gradient.    He has not had significant aortic stenosis on previous echocardiograms.  At next visit, we will arrange a follow-up echocardiogram to reassess.  Coronary artery disease History of CABG in 2016.  Cardiac catheterization in 2020 demonstrated patent LIMA-LAD but occluded vein graft to RI and vein graft to RPDA.  There were no targets for PCI and he has been managed medically.  He has had some chest discomfort.  However, this does not seem to be consistent with angina.  I suspect this is all related to constipation.  He is not on aspirin as he is on Apixaban.  Continue Alirocumab, bisoprolol.  Orthostatic hypotension Overall, symptoms are stable.  We will have to tolerate a higher blood pressure for now.  Once his constipation issues are addressed, we can certainly try to titrate his medications again for better blood pressure control.  Continue compression socks.  Ventricular tachycardia ([place]) His device was interrogated during recent hospitalization.  He has not had any further episodes of VT.  Continue amiodarone 200 mg daily.  TSH in September was 5.069.  ALT in November was 18.  Persistent atrial fibrillation (HCC) Continue apixaban 5 mg twice daily.  Nausea He notes improved symptoms off of empagliflozin.  I have asked him to remain off of this.  He also notes significant issues with constipation.  He sees GI soon.           Dispo:  Return in about 8 weeks (around 01/20/2022) for Routine Follow Up, w/ [name], PA-C.    Prior CV studies: Chest CTA 10/31/2021 No evidence of  pulmonary embolism; right lower lobe pneumonia; aortic atherosclerosis   Carotid US 10/29/2021 R ICA 40-59, L ICA 1-39   Echocardiogram 08/28/2021 EF 50-55, anterior/anteroseptal HK, severe asymmetric LVH, moderately reduced RVSF, trivial MR, moderate aortic stenosis, mean gradient 6.5, V-max 171.75 cm/s, DI 0.31 (suspect paradoxical low-flow low gradient moderate AS), small mobile echodensity adjacent RV pacemaker lead   Echocardiogram 12/28/2020 EF 35-40, moderate asymmetric LVH, normal RVSF, moderate LAE   Cardiac catheterization 01/30/2019 LAD ostial 50, proximal 70, mid 50; D1 ostial 80, 95 RI ostial 90 LCx ostial 65, mid 85 RCA proximal 100 CTO LIMA-mid LAD patent SVG-RI 100 SVG-RPDA 100 Medical therapy   Echocardiogram 01/28/2019 EF 50-55, mild reduced RVSF moderate calcification of aortic valve without aortic stenosis     Past Medical History:  Diagnosis Date   Acquired thrombophilia ([place]) 11/01/2021   Arthritis    CAD (coronary artery disease)    Carotid artery occlusion    Cataract    Bil eyes/worse in left eye   CHF (congestive heart  failure) ([place])    Chronic back pain    COVID-19 10/31/2021   DVT (deep venous thrombosis) (HCC)    Dysrhythmia    Enlarged prostate    takes Rapaflo daily   GERD (gastroesophageal reflux disease)    occasional   History of colon polyps    History of gout    has colchicine prn   History of kidney stones    Hyperlipidemia    takes Crestor daily   Hypertension    takes Amlodipine daily   Hypothyroidism    Myocardial infarction [facility])    Peripheral vascular disease (HCC)    Prolonged QT interval 11/01/2021   Pulmonary emboli ([place]) 03/20/2015   elevated d-dimer, intermediate V/Q study, atypical chest pain and SOB. Start on Xarelto 20mg  BID for 3 month   Rapid atrial fibrillation (HCC)    Renal insufficiency    Shortness of breath dyspnea    Urinary frequency    Urinary urgency    Current Medications: Current Meds  Medication  Sig   acetaminophen (TYLENOL) 325 MG tablet Take 2 tablets (650 mg total) by mouth every 6 (six) hours as needed for mild pain (or Fever >/= 101).   allopurinol (ZYLOPRIM) 100 MG tablet Take 100 mg by mouth daily as needed (gout).   ALPRAZolam (XANAX) 0.5 MG tablet Take 0.5 mg by mouth 3 (three) times daily as needed for anxiety or sleep.   amiodarone (PACERONE) 200 MG tablet Take 1 tablet (200 mg total) by mouth daily.   apixaban (ELIQUIS) 5 MG TABS tablet Take 1 tablet (5 mg total) by mouth 2 (two) times daily.   bisoprolol (ZEBETA) 5 MG tablet Take 0.5 tablets (2.5 mg total) by mouth daily.   finasteride (PROSCAR) 5 MG tablet Take 5 mg by mouth daily.   furosemide (LASIX) 20 MG tablet Take 1 tablet (20 mg total) by mouth every other day.   levothyroxine (SYNTHROID) 25 MCG tablet Take 25 mcg by mouth daily.   metoCLOPramide (REGLAN) 5 MG tablet Take 1 tablet (5 mg total) by mouth every 6 (six) hours as needed for nausea.   nitroGLYCERIN (NITROSTAT) 0.4 MG SL tablet Place 1 tablet (0.4 mg total) under the tongue every 5 (five) minutes as needed for chest pain.   ondansetron (ZOFRAN-ODT) 4 MG disintegrating tablet Take 4 mg by mouth every 6 (six) hours as needed for nausea/vomiting.   pantoprazole (PROTONIX) 40 MG tablet Take 1 tablet (40 mg total) by mouth daily.   polyethylene glycol (MIRALAX / GLYCOLAX) 17 g packet Take 17 g by mouth 3 (three) times daily.   REPATHA SURECLICK 292 MG/ML SOAJ INJECT 1 PEN INTO THE SKIN EVERY 14 DAYS.   senna-docusate (SENOKOT-S) 8.6-50 MG tablet Take 1 tablet by mouth at bedtime.   spironolactone (ALDACTONE) 25 MG tablet Take 0.5 tablets (12.5 mg total) by mouth daily.    Allergies:   Jardiance [empagliflozin], Ezetimibe, Ace inhibitors, Aspirin, Atorvastatin, Ezetimibe-simvastatin, Codeine, and Unithroid [levothyroxine sodium]   Social History   Tobacco Use   Smoking status: Former    Types: Cigarettes    Quit date: 02/04/1987    Years since quitting:  34.8   Smokeless tobacco: Former    Types: Chew    Quit date: 07/20/2009   Tobacco comments:    quit 35+yrs ago  Vaping Use   Vaping Use: Never used  Substance Use Topics   Alcohol use: No    Alcohol/week: 0.0 standard drinks   Drug use: No    Family  Hx: The patient's family history includes Diabetes in his son; Heart attack in his father and sister; Heart disease in his father and sister; Hypertension in his mother and sister.  Review of Systems  Gastrointestinal:  Positive for constipation.    EKGs/Labs/Other Test Reviewed:    EKG:  EKG is notn ordered today.  The ekg ordered today demonstrates n/a  Recent Labs: 09/11/2021: TSH 5.069 11/13/2021: B Natriuretic Peptide 787.3 11/19/2021: Magnesium 2.1 11/24/2021: ALT 26; BUN 21; Creatinine, Ser 1.10; Hemoglobin 14.5; Platelets 251; Potassium 4.0; Sodium 141   Recent Lipid Panel Lab Results  Component Value Date/Time   CHOL 99 (L) 07/29/2020 12:13 PM   TRIG 72 07/29/2020 12:13 PM   HDL 39 (L) 07/29/2020 12:13 PM   LDLCALC 45 07/29/2020 12:13 PM     Risk Assessment/Calculations:    CHA2DS2-VASc Score = 7   This indicates a 11.2% annual risk of stroke. The patient's score is based upon: CHF History: 1 HTN History: 1 Diabetes History: 0 Stroke History: 2 Vascular Disease History: 1 Age Score: 2 Gender Score: 0         Physical Exam:    VS:  BP (!) 150/60   Pulse 88   Ht 5\' 6"  (1.676 m)   Wt 149 lb 9.6 oz (67.9 kg)   SpO2 98%   BMI 24.15 kg/m     Wt Readings from Last 3 Encounters:  11/25/21 149 lb 9.6 oz (67.9 kg)  11/21/21 142 lb (64.4 kg)  11/17/21 146 lb 2.6 oz (66.3 kg)    Constitutional:      Appearance: Healthy appearance. Not in distress.  Neck:     Vascular: No JVR. JVD normal.  Pulmonary:     Effort: Pulmonary effort is normal.     Breath sounds: No wheezing. No rales.  Cardiovascular:     Normal rate. Regular rhythm. Normal S1. Normal S2.      Murmurs: There is a grade 1/6 systolic  murmur at the ULSB.  Edema:    Peripheral edema absent.  Abdominal:     Palpations: Abdomen is soft. There is no hepatomegaly.  Skin:    General: Skin is warm and dry.  Neurological:     General: No focal deficit present.     Mental Status: Alert and oriented to person, place and time.     Cranial Nerves: Cranial nerves are intact.       Medication Adjustments/Labs and Tests Ordered: Current medicines are reviewed at length with the patient today.  Concerns regarding medicines are outlined above.  Tests Ordered: No orders of the defined types were placed in this encounter.  Medication Changes: No orders of the defined types were placed in this encounter.  Signed, [name], PA-C  11/25/2021 5:11 PM    [facility] [address], [place], [place]  [address] Phone: [phone]; Fax: [phone]

## 2021-11-24 NOTE — ED Triage Notes (Addendum)
Pt presents with abd pain x3-4 months. Pt has been seen in the ED 5 times for the same. Pt reports constipation and nausea. Pt reports last normal BM was "3-4 or 5 weeks ago". Pt had a small BM today but "not enough to count"    Pt reports food makes it worse

## 2021-11-24 NOTE — ED Provider Notes (Signed)
[facility] Provider Note  CSN: [id number] Arrival date & time: 11/24/21 1451    History Chief Complaint  Patient presents with   Abdominal Pain    Ryan [name] is a 80 y.o. male with history of multiple medical problems and frequent ED visits is here today for constipation. States he has not been able to go regularly and it causes him abdominal discomfort. He was recently admitted for chest pains/pacemaker interrogation and was given Sennecot and Miralax which worked well for him. He has had several prior ED visits for abdominal pain as well. He is requesting some medication to get his bowels moving. Otherwise he is at baseline. No vomiting. No fever.    Past Medical History:  Diagnosis Date   Acquired thrombophilia ([place]) 11/01/2021   Arthritis    CAD (coronary artery disease)    Carotid artery occlusion    Cataract    Bil eyes/worse in left eye   CHF (congestive heart failure) (HCC)    Chronic back pain    COVID-19 10/31/2021   DVT (deep venous thrombosis) (HCC)    Dysrhythmia    Enlarged prostate    takes Rapaflo daily   GERD (gastroesophageal reflux disease)    occasional   History of colon polyps    History of gout    has colchicine prn   History of kidney stones    Hyperlipidemia    takes Crestor daily   Hypertension    takes Amlodipine daily   Hypothyroidism    Myocardial infarction [facility])    Peripheral vascular disease (HCC)    Prolonged QT interval 11/01/2021   Pulmonary emboli ([place]) 03/20/2015   elevated d-dimer, intermediate V/Q study, atypical chest pain and SOB. Start on Xarelto 20mg  BID for 3 month   Rapid atrial fibrillation ([place])    Renal insufficiency    Shortness of breath dyspnea    Urinary frequency    Urinary urgency     Past Surgical History:  Procedure Laterality Date   APPENDECTOMY     BACK SURGERY     5 times   big toe surgery     CARDIAC CATHETERIZATION     2010    dr Acie Fredrickson   cataract surgery      left eye   CHOLECYSTECTOMY N/A 07/27/2016   Procedure: LAPAROSCOPIC CHOLECYSTECTOMY;  Surgeon: Mickeal Skinner, MD;  Location: [place];  Service: General;  Laterality: N/A;   COLONOSCOPY     CORONARY ARTERY BYPASS GRAFT N/A 04/05/2015   Procedure: CORONARY ARTERY BYPASS GRAFTING (CABG)X4 LIMA-LAD; SVG-DIAG1-DIAG2; SVG-PD;  Surgeon: [name], MD;  Location: [place];  Service: Open Heart Surgery;  Laterality: N/A;   CORONARY/GRAFT ANGIOGRAPHY N/A 04/22/2018   Procedure: CORONARY/GRAFT ANGIOGRAPHY;  Surgeon: Martinique, Peter M, MD;  Location: [place] CV LAB;  Service: Cardiovascular;  Laterality: N/A;   CYSTOSCOPY     ENDARTERECTOMY Left 04/24/2016   Procedure: ENDARTERECTOMY LEFT CAROTID;  Surgeon: Mal Misty, MD;  Location: [place];  Service: Vascular;  Laterality: Left;   EYE SURGERY     FEMORAL ARTERY - POPLITEAL ARTERY BYPASS GRAFT     ICD IMPLANT N/A 12/30/2020   Procedure: ICD IMPLANT;  Surgeon: Evans Lance, MD;  Location: [place] CV LAB;  Service: Cardiovascular;  Laterality: N/A;   JOINT REPLACEMENT     shoulder   LEFT HEART CATHETERIZATION WITH CORONARY ANGIOGRAM N/A 04/03/2015   Procedure: LEFT HEART CATHETERIZATION WITH CORONARY ANGIOGRAM;  Surgeon: Troy Sine,  MD;  Location: [place] CATH LAB;  Service: Cardiovascular;  Laterality: N/A;   LUMBAR LAMINECTOMY  01/06/2013   Procedure: MICRODISCECTOMY LUMBAR LAMINECTOMY;  Surgeon: Marybelle Killings, MD;  Location: [place];  Service: Orthopedics;  Laterality: N/A;  L3-4 decompression   LUMBAR LAMINECTOMY/DECOMPRESSION MICRODISCECTOMY  02/12/2012   Procedure: LUMBAR LAMINECTOMY/DECOMPRESSION MICRODISCECTOMY;  Surgeon: Floyce Stakes, MD;  Location: [place] [facility];  Service: Neurosurgery;  Laterality: N/A;  Lumbar four-five laminectomy   PATCH ANGIOPLASTY Left 04/24/2016   Procedure: LEFT CAROTID ARTERY PATCH ANGIOPLASTY;  Surgeon: Mal Misty, MD;  Location: [place];  Service: Vascular;  Laterality: Left;   RIGHT HEART CATH AND  CORONARY/GRAFT ANGIOGRAPHY N/A 01/30/2019   Procedure: RIGHT HEART CATH AND CORONARY/GRAFT ANGIOGRAPHY;  Surgeon: Troy Sine, MD;  Location: [place] CV LAB;  Service: Cardiovascular;  Laterality: N/A;   STERIOD INJECTION Right 01/09/2014   Procedure: STEROID INJECTION;  Surgeon: Mcarthur Rossetti, MD;  Location: [place];  Service: Orthopedics;  Laterality: Right;   TEE WITHOUT CARDIOVERSION N/A 04/05/2015   Procedure: TRANSESOPHAGEAL ECHOCARDIOGRAM (TEE);  Surgeon: [name], MD;  Location: [place];  Service: Open Heart Surgery;  Laterality: N/A;   TOTAL HIP ARTHROPLASTY Left 01/09/2014   DR Ninfa Linden   TOTAL HIP ARTHROPLASTY Left 01/09/2014   Procedure: LEFT TOTAL HIP ARTHROPLASTY ANTERIOR APPROACH and Steroid Injection Right hip;  Surgeon: Mcarthur Rossetti, MD;  Location: [place];  Service: Orthopedics;  Laterality: Left;   TOTAL HIP ARTHROPLASTY Right 08/15/2019   TOTAL HIP ARTHROPLASTY Right 08/15/2019   Procedure: RIGHT TOTAL HIP ARTHROPLASTY ANTERIOR APPROACH;  Surgeon: Mcarthur Rossetti, MD;  Location: [place];  Service: Orthopedics;  Laterality: Right;    Family History  Problem Relation Age of Onset   Heart disease Father    Heart attack Father    Heart disease Sister    Hypertension Sister    Heart attack Sister    Hypertension Mother    Diabetes Son     Social History   Tobacco Use   Smoking status: Former    Types: Cigarettes    Quit date: 02/04/1987    Years since quitting: 34.8   Smokeless tobacco: Former    Types: Chew    Quit date: 07/20/2009   Tobacco comments:    quit 35+yrs ago  Vaping Use   Vaping Use: Never used  Substance Use Topics   Alcohol use: No    Alcohol/week: 0.0 standard drinks   Drug use: No     Home Medications Prior to Admission medications   Medication Sig Start Date End Date Taking? Authorizing Provider  acetaminophen (TYLENOL) 325 MG tablet Take 2 tablets (650 mg total) by mouth every 6 (six) hours as needed  for mild pain (or Fever >/= 101). 11/19/21   Raiford Noble Latif, DO  allopurinol (ZYLOPRIM) 100 MG tablet Take 100 mg by mouth daily as needed (gout). 05/29/20   [provider]  ALPRAZolam Duanne Moron) 0.5 MG tablet Take 0.5 mg by mouth 3 (three) times daily as needed for anxiety or sleep. 10/07/21   [provider]  amiodarone (PACERONE) 200 MG tablet Take 1 tablet (200 mg total) by mouth daily. 09/11/21   Mercy Riding, MD  apixaban (ELIQUIS) 5 MG TABS tablet Take 1 tablet (5 mg total) by mouth 2 (two) times daily. 11/12/21 11/12/22  [name] T, PA-C  bisoprolol (ZEBETA) 5 MG tablet Take 0.5 tablets (2.5 mg total) by mouth daily. 11/12/21 11/12/22  [name]  T, PA-C  empagliflozin (JARDIANCE) 10 MG TABS tablet Take 1 tablet (10 mg total) by mouth daily. 11/10/21   [name], [name], MD  finasteride (PROSCAR) 5 MG tablet Take 5 mg by mouth daily. 08/18/21   [provider]  furosemide (LASIX) 20 MG tablet Take 1 tablet (20 mg total) by mouth every other day. 11/12/21 05/11/22  [name] T, PA-C  levothyroxine (SYNTHROID) 25 MCG tablet Take 25 mcg by mouth daily. 07/02/21   [provider]  metoCLOPramide (REGLAN) 5 MG tablet Take 1 tablet (5 mg total) by mouth every 6 (six) hours as needed for nausea. 11/13/21   Carlisle Cater, PA-C  nitroGLYCERIN (NITROSTAT) 0.4 MG SL tablet Place 1 tablet (0.4 mg total) under the tongue every 5 (five) minutes as needed for chest pain. 07/09/21   Evans Lance, MD  ondansetron (ZOFRAN-ODT) 4 MG disintegrating tablet Take 4 mg by mouth every 6 (six) hours as needed for nausea/vomiting. 10/30/21   [provider]  pantoprazole (PROTONIX) 40 MG tablet Take 1 tablet (40 mg total) by mouth daily. 11/12/21   [name] T, PA-C  polyethylene glycol (MIRALAX / GLYCOLAX) 17 g packet Take 17 g by mouth daily. 11/20/21   Sheikh, Omair Latif, DO  REPATHA SURECLICK 034 MG/ML SOAJ INJECT 1 PEN INTO THE SKIN EVERY 14  DAYS. Patient taking differently: Inject 140 mg into the skin every 14 (fourteen) days. 09/16/21   [name], [name], MD  senna-docusate (SENOKOT-S) 8.6-50 MG tablet Take 1 tablet by mouth at bedtime. 11/19/21   Raiford Noble Latif, DO  spironolactone (ALDACTONE) 25 MG tablet Take 0.5 tablets (12.5 mg total) by mouth daily. 10/16/21 10/11/22  Loel Dubonnet, NP  tamsulosin (FLOMAX) 0.4 MG CAPS capsule Take 0.4 mg by mouth at bedtime. 03/01/17   [provider]     Allergies    Zetia [ezetimibe], Ace inhibitors, Aspirin, Atorvastatin, Ezetimibe-simvastatin, Codeine, and Unithroid [levothyroxine sodium]   Review of Systems   Review of Systems A comprehensive review of systems was completed and negative except as noted in HPI.    Physical Exam BP (!) 157/83   Pulse (!) 32   Temp (!) 97.5 F (36.4 C)   Resp 15   SpO2 99%   Physical Exam Vitals and nursing note reviewed.  Constitutional:      Appearance: Normal appearance.  HENT:     Head: Normocephalic and atraumatic.     Nose: Nose normal.     Mouth/Throat:     Mouth: Mucous membranes are moist.  Eyes:     Extraocular Movements: Extraocular movements intact.     Conjunctiva/sclera: Conjunctivae normal.  Cardiovascular:     Rate and Rhythm: Normal rate.  Pulmonary:     Effort: Pulmonary effort is normal.     Breath sounds: Normal breath sounds.  Abdominal:     General: Abdomen is flat.     Palpations: Abdomen is soft.     Tenderness: There is no abdominal tenderness. There is no guarding. Negative signs include Murphy's sign and McBurney's sign.  Musculoskeletal:        General: No swelling. Normal range of motion.     Cervical back: Neck supple.  Skin:    General: Skin is warm and dry.  Neurological:     General: No focal deficit present.     Mental Status: He is alert.  Psychiatric:        Mood and Affect: Mood normal.     ED Results /  Procedures / Treatments   Labs (all labs ordered are listed, but  only abnormal results are displayed) Labs Reviewed  COMPREHENSIVE METABOLIC PANEL - Abnormal; Notable for the following components:      Result Value   Glucose, Bld 69 (*)    All other components within normal limits  LIPASE, BLOOD  CBC    EKG None   Radiology No results found.  Procedures Procedures  Medications Ordered in the ED Medications - No data to display   MDM Rules/Calculators/A&P MDM Patient's abdomen is benign. Labs are unremarkable. He was advised that the medications he was given in the hospital are available over the counter. He can take miralax 2-3 times per day as needed. He already has GI follow up scheduled for next week. No other indication for additional ED workup today.   ED Course  I have reviewed the triage vital signs and the nursing notes.  Pertinent labs & imaging results that were available during my care of the patient were reviewed by me and considered in my medical decision making (see chart for details).     Final Clinical Impression(s) / ED Diagnoses Final diagnoses:  Constipation, unspecified constipation type    Rx / DC Orders ED Discharge Orders     None        Truddie Hidden, MD 11/24/21 [phone]

## 2021-11-25 ENCOUNTER — Ambulatory Visit (INDEPENDENT_AMBULATORY_CARE_PROVIDER_SITE_OTHER): Payer: Medicare Other | Admitting: Physician Assistant

## 2021-11-25 ENCOUNTER — Encounter: Payer: Self-pay | Admitting: Physician Assistant

## 2021-11-25 VITALS — BP 150/60 | HR 88 | Ht 66.0 in | Wt 149.6 lb

## 2021-11-25 DIAGNOSIS — I4819 Other persistent atrial fibrillation: Secondary | ICD-10-CM

## 2021-11-25 DIAGNOSIS — I350 Nonrheumatic aortic (valve) stenosis: Secondary | ICD-10-CM

## 2021-11-25 DIAGNOSIS — R110 Nausea: Secondary | ICD-10-CM

## 2021-11-25 DIAGNOSIS — I472 Ventricular tachycardia, unspecified: Secondary | ICD-10-CM

## 2021-11-25 DIAGNOSIS — I951 Orthostatic hypotension: Secondary | ICD-10-CM

## 2021-11-25 DIAGNOSIS — I5032 Chronic diastolic (congestive) heart failure: Secondary | ICD-10-CM | POA: Diagnosis not present

## 2021-11-25 DIAGNOSIS — I2510 Atherosclerotic heart disease of native coronary artery without angina pectoris: Secondary | ICD-10-CM

## 2021-11-25 DIAGNOSIS — I6523 Occlusion and stenosis of bilateral carotid arteries: Secondary | ICD-10-CM

## 2021-11-25 NOTE — Assessment & Plan Note (Addendum)
He notes improved symptoms off of empagliflozin.  I have asked him to remain off of this.  He also notes significant issues with constipation.  He sees GI soon.

## 2021-11-25 NOTE — Assessment & Plan Note (Signed)
His device was interrogated during recent hospitalization.  He has not had any further episodes of VT.  Continue amiodarone 200 mg daily.  TSH in September was 5.069.  ALT in November was 18.

## 2021-11-25 NOTE — Assessment & Plan Note (Signed)
Continue apixaban 5 mg twice daily.

## 2021-11-25 NOTE — Assessment & Plan Note (Signed)
History of CABG in 2016.  Cardiac catheterization in 2020 demonstrated patent LIMA-LAD but occluded vein graft to RI and vein graft to RPDA.  There were no targets for PCI and he has been managed medically.  He has had some chest discomfort.  However, this does not seem to be consistent with angina.  I suspect this is all related to constipation.  He is not on aspirin as he is on Apixaban.  Continue Alirocumab, bisoprolol.

## 2021-11-25 NOTE — Assessment & Plan Note (Addendum)
Overall, symptoms are stable.  We will have to tolerate a higher blood pressure for now.  Once his constipation issues are addressed, we can certainly try to titrate his medications again for better blood pressure control.  Continue compression socks.

## 2021-11-25 NOTE — Assessment & Plan Note (Signed)
Echocardiogram in September suggested moderate aortic stenosis and probable paradoxical low-flow low gradient.    He has not had significant aortic stenosis on previous echocardiograms.  At next visit, we will arrange a follow-up echocardiogram to reassess.

## 2021-11-25 NOTE — Assessment & Plan Note (Signed)
He has had variable ejection fractions in the past.  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  At that time, his EF was down to 35-40%.  During his hospitalization in September, his EF was back to 50-55%.  I saw him 2 weeks ago with significant symptoms of dizziness, nausea.  He had evidence of orthostatic hypotension.  I reduced his furosemide to every other day.  I also changed his carvedilol back to bisoprolol.  He stopped taking empagliflozin due to nausea.  He has had significant improvement in his symptoms with regular bowel movements.  He sees GI soon.  At this point, I will not make any further changes in his medical regimen.  Once his GI situation is improved, we can attempt to adjust GDMT again.  I will see him back again in 2 months.

## 2021-11-25 NOTE — Patient Instructions (Signed)
Medication Instructions:   You can stay off your Jardiance.   *If you need a refill on your cardiac medications before your next appointment, please call your pharmacy*   Lab Work:  -NONE  If you have labs (blood work) drawn today and your tests are completely normal, you will receive your results only by: [place] (if you have MyChart) OR A paper copy in the mail If you have any lab test that is abnormal or we need to change your treatment, we will call you to review the results.   Testing/Procedures:  -NONE   Follow-Up: At [facility], you and your health needs are our priority.  As part of our continuing mission to provide you with exceptional heart care, we have created designated Provider Care Teams.  These Care Teams include your primary Cardiologist (physician) and Advanced Practice Providers (APPs -  Physician Assistants and Nurse Practitioners) who all work together to provide you with the care you need, when you need it.  We recommend signing up for the patient portal called "MyChart".  Sign up information is provided on this After Visit Summary.  MyChart is used to connect with patients for Virtual Visits (Telemedicine).  Patients are able to view lab/test results, encounter notes, upcoming appointments, etc.  Non-urgent messages can be sent to your provider as well.   To learn more about what you can do with MyChart, go to [email or web address].    Your next appointment:   2 month(s)  The format for your next appointment:   In Person  Provider:   [name], PA-C.     Other Instructions

## 2021-11-26 ENCOUNTER — Other Ambulatory Visit: Payer: Self-pay | Admitting: *Deleted

## 2021-11-26 ENCOUNTER — Encounter: Payer: Self-pay | Admitting: *Deleted

## 2021-11-26 NOTE — Patient Instructions (Signed)
Visit Information  Thank you for taking time to visit with me today. Please don't hesitate to contact me if I can be of assistance to you before our next scheduled telephone appointment.    

## 2021-11-26 NOTE — Patient Outreach (Signed)
[facility]) Care Management  [place]  11/26/2021   DON TIU [birth year] [phone]   RN spoke with pt and permission to speak with his spouse Arnell Asal. Pt has agreed to enrolled into the nursing program for CHF.   Encounter Medications:  Outpatient Encounter Medications as of 11/26/2021  Medication Sig   acetaminophen (TYLENOL) 325 MG tablet Take 2 tablets (650 mg total) by mouth every 6 (six) hours as needed for mild pain (or Fever >/= 101).   allopurinol (ZYLOPRIM) 100 MG tablet Take 100 mg by mouth daily as needed (gout).   ALPRAZolam (XANAX) 0.5 MG tablet Take 0.5 mg by mouth 3 (three) times daily as needed for anxiety or sleep.   amiodarone (PACERONE) 200 MG tablet Take 1 tablet (200 mg total) by mouth daily.   apixaban (ELIQUIS) 5 MG TABS tablet Take 1 tablet (5 mg total) by mouth 2 (two) times daily.   bisoprolol (ZEBETA) 5 MG tablet Take 0.5 tablets (2.5 mg total) by mouth daily.   finasteride (PROSCAR) 5 MG tablet Take 5 mg by mouth daily.   furosemide (LASIX) 20 MG tablet Take 1 tablet (20 mg total) by mouth every other day.   levothyroxine (SYNTHROID) 25 MCG tablet Take 25 mcg by mouth daily.   metoCLOPramide (REGLAN) 5 MG tablet Take 1 tablet (5 mg total) by mouth every 6 (six) hours as needed for nausea.   nitroGLYCERIN (NITROSTAT) 0.4 MG SL tablet Place 1 tablet (0.4 mg total) under the tongue every 5 (five) minutes as needed for chest pain.   ondansetron (ZOFRAN-ODT) 4 MG disintegrating tablet Take 4 mg by mouth every 6 (six) hours as needed for nausea/vomiting.   pantoprazole (PROTONIX) 40 MG tablet Take 1 tablet (40 mg total) by mouth daily.   polyethylene glycol (MIRALAX / GLYCOLAX) 17 g packet Take 17 g by mouth 3 (three) times daily.   REPATHA SURECLICK 916 MG/ML SOAJ INJECT 1 PEN INTO THE SKIN EVERY 14 DAYS.   senna-docusate (SENOKOT-S) 8.6-50 MG tablet Take 1 tablet by mouth at bedtime.   spironolactone (ALDACTONE) 25 MG  tablet Take 0.5 tablets (12.5 mg total) by mouth daily.   No facility-administered encounter medications on file as of 11/26/2021.    Functional Status:  In your present state of health, do you have any difficulty performing the following activities: 11/26/2021 11/17/2021  Hearing? N N  Vision? N N  Difficulty concentrating or making decisions? N N  Walking or climbing stairs? N N  Dressing or bathing? N N  Doing errands, shopping? N N  Preparing Food and eating ? N -  Using the Toilet? N -  In the past six months, have you accidently leaked urine? N -  Do you have problems with loss of bowel control? N -  Managing your Medications? N -  Managing your Finances? N -  Housekeeping or managing your Housekeeping? N -  Some recent data might be hidden    Fall/Depression Screening: Fall Risk  11/26/2021  Falls in the past year? 0  Follow up Falls prevention discussed;Education provided   PHQ 2/9 Scores 11/26/2021  PHQ - 2 Score 0    Assessment:   Care Plan Care Plan : RN Care Manger Plan of Care  Updates made by Tobi Bastos, RN since 11/26/2021 12:00 AM     Problem: Knowledge Deficit related to CHF and care coordination needs   Priority: High     Long-Range Goal: Patient Stated  Start Date: 11/26/2021  Expected End Date: 03/20/2022  Priority: High  Note:   Current Barriers:  Knowledge Deficits related to plan of care for management of CHF   RNCM Clinical Goal(s):  Patient will verbalize understanding of plan for management of CHF as evidenced by responding to teach-back information that was educated. verbalize basic understanding of  CHF disease process and self health management plan as evidenced by seld awareness of the HF zones and what to do if acute symptom occur. take all medications exactly as prescribed and will call provider for medication related questions as evidenced by refilling all medications  attend all scheduled medical appointments: 12/15 as evidenced  by Discussed  through collaboration with RN Care manager, provider, and care team.   Interventions: Inter-disciplinary care team collaboration (see longitudinal plan of care) Evaluation of current treatment plan related to  self management and patient's adherence to plan as established by provider   Heart Failure Interventions:  (Status:  New goal.) Long Term Goal Basic overview and discussion of pathophysiology of Heart Failure reviewed Advised patient to weigh each morning after emptying bladder Discussed importance of daily weight and advised patient to weigh and record daily Reviewed role of diuretics in prevention of fluid overload and management of heart failure; Discussed the importance of keeping all appointments with provider  Patient Goals/Self-Care Activities: Take all medications as prescribed Attend all scheduled provider appointments Perform all self care activities independently  Perform IADL's (shopping, preparing meals, housekeeping, managing finances) independently Call provider office for new concerns or questions   Follow Up Plan:  Telephone follow up appointment with care management team member scheduled for:  Jan 2023 The patient has been provided with contact information for the care management team and has been advised to call with any health related questions or concerns.      [name], RN Care Management Coordinator [place] Office [phone]

## 2021-12-03 DIAGNOSIS — K5900 Constipation, unspecified: Secondary | ICD-10-CM | POA: Diagnosis not present

## 2021-12-03 DIAGNOSIS — R39198 Other difficulties with micturition: Secondary | ICD-10-CM | POA: Diagnosis not present

## 2021-12-09 DIAGNOSIS — I10 Essential (primary) hypertension: Secondary | ICD-10-CM | POA: Diagnosis not present

## 2021-12-09 DIAGNOSIS — H8110 Benign paroxysmal vertigo, unspecified ear: Secondary | ICD-10-CM | POA: Diagnosis not present

## 2021-12-09 DIAGNOSIS — I480 Paroxysmal atrial fibrillation: Secondary | ICD-10-CM | POA: Diagnosis not present

## 2021-12-09 DIAGNOSIS — Z1152 Encounter for screening for COVID-19: Secondary | ICD-10-CM | POA: Diagnosis not present

## 2021-12-09 DIAGNOSIS — K219 Gastro-esophageal reflux disease without esophagitis: Secondary | ICD-10-CM | POA: Diagnosis not present

## 2021-12-09 DIAGNOSIS — R110 Nausea: Secondary | ICD-10-CM | POA: Diagnosis not present

## 2021-12-15 ENCOUNTER — Emergency Department (HOSPITAL_BASED_OUTPATIENT_CLINIC_OR_DEPARTMENT_OTHER): Payer: Medicare Other

## 2021-12-15 ENCOUNTER — Other Ambulatory Visit: Payer: Self-pay

## 2021-12-15 ENCOUNTER — Encounter (HOSPITAL_BASED_OUTPATIENT_CLINIC_OR_DEPARTMENT_OTHER): Payer: Self-pay | Admitting: Emergency Medicine

## 2021-12-15 ENCOUNTER — Emergency Department (HOSPITAL_BASED_OUTPATIENT_CLINIC_OR_DEPARTMENT_OTHER)
Admission: EM | Admit: 2021-12-15 | Discharge: 2021-12-15 | Disposition: A | Discharge status: Home / Self Care | Payer: Medicare Other | Attending: Emergency Medicine | Admitting: Emergency Medicine

## 2021-12-15 DIAGNOSIS — Z8616 Personal history of COVID-19: Secondary | ICD-10-CM | POA: Diagnosis not present

## 2021-12-15 DIAGNOSIS — Z96643 Presence of artificial hip joint, bilateral: Secondary | ICD-10-CM | POA: Diagnosis not present

## 2021-12-15 DIAGNOSIS — Z951 Presence of aortocoronary bypass graft: Secondary | ICD-10-CM | POA: Insufficient documentation

## 2021-12-15 DIAGNOSIS — I5023 Acute on chronic systolic (congestive) heart failure: Secondary | ICD-10-CM | POA: Diagnosis not present

## 2021-12-15 DIAGNOSIS — I130 Hypertensive heart and chronic kidney disease with heart failure and stage 1 through stage 4 chronic kidney disease, or unspecified chronic kidney disease: Secondary | ICD-10-CM | POA: Insufficient documentation

## 2021-12-15 DIAGNOSIS — Z87891 Personal history of nicotine dependence: Secondary | ICD-10-CM | POA: Diagnosis not present

## 2021-12-15 DIAGNOSIS — Z95810 Presence of automatic (implantable) cardiac defibrillator: Secondary | ICD-10-CM | POA: Diagnosis not present

## 2021-12-15 DIAGNOSIS — R0602 Shortness of breath: Secondary | ICD-10-CM

## 2021-12-15 DIAGNOSIS — Z96619 Presence of unspecified artificial shoulder joint: Secondary | ICD-10-CM | POA: Insufficient documentation

## 2021-12-15 DIAGNOSIS — E039 Hypothyroidism, unspecified: Secondary | ICD-10-CM | POA: Insufficient documentation

## 2021-12-15 DIAGNOSIS — J811 Chronic pulmonary edema: Secondary | ICD-10-CM | POA: Diagnosis not present

## 2021-12-15 DIAGNOSIS — I110 Hypertensive heart disease with heart failure: Secondary | ICD-10-CM | POA: Diagnosis not present

## 2021-12-15 DIAGNOSIS — Z20822 Contact with and (suspected) exposure to covid-19: Secondary | ICD-10-CM | POA: Diagnosis not present

## 2021-12-15 DIAGNOSIS — N1831 Chronic kidney disease, stage 3a: Secondary | ICD-10-CM | POA: Insufficient documentation

## 2021-12-15 DIAGNOSIS — Z7901 Long term (current) use of anticoagulants: Secondary | ICD-10-CM | POA: Diagnosis not present

## 2021-12-15 DIAGNOSIS — I2510 Atherosclerotic heart disease of native coronary artery without angina pectoris: Secondary | ICD-10-CM | POA: Insufficient documentation

## 2021-12-15 LAB — RESP PANEL BY RT-PCR (FLU A&B, COVID) ARPGX2
Influenza A by PCR: NEGATIVE
Influenza B by PCR: NEGATIVE
SARS Coronavirus 2 by RT PCR: NEGATIVE

## 2021-12-15 LAB — BASIC METABOLIC PANEL
Anion gap: 10 (ref 5–15)
BUN: 23 mg/dL (ref 8–23)
CO2: 29 mmol/L (ref 22–32)
Calcium: 9.2 mg/dL (ref 8.9–10.3)
Chloride: 103 mmol/L (ref 98–111)
Creatinine, Ser: 1.18 mg/dL (ref 0.61–1.24)
GFR, Estimated: >60 mL/min (ref >60)
Glucose, Bld: 113 mg/dL — ABNORMAL HIGH (ref 70–99)
Potassium: 3.6 mmol/L (ref 3.5–5.1)
Sodium: 142 mmol/L (ref 135–145)

## 2021-12-15 LAB — CBC WITH DIFFERENTIAL/PLATELET
Abs Immature Granulocytes: 0.03 10*3/uL (ref 0.00–0.07)
Basophils Absolute: 0 10*3/uL (ref 0.0–0.1)
Basophils Relative: 0 %
Eosinophils Absolute: 0.1 10*3/uL (ref 0.0–0.5)
Eosinophils Relative: 1 %
HCT: 49.4 % (ref 39.0–52.0)
Hemoglobin: 16 g/dL (ref 13.0–17.0)
Immature Granulocytes: 0 %
Lymphocytes Relative: 21 %
Lymphs Abs: 1.9 10*3/uL (ref 0.7–4.0)
MCH: 32.4 pg (ref 26.0–34.0)
MCHC: 32.4 g/dL (ref 30.0–36.0)
MCV: 100 fL (ref 80.0–100.0)
Monocytes Absolute: 0.8 10*3/uL (ref 0.1–1.0)
Monocytes Relative: 8 %
Neutro Abs: 6.2 10*3/uL (ref 1.7–7.7)
Neutrophils Relative %: 70 %
Platelets: 304 10*3/uL (ref 150–400)
RBC: 4.94 MIL/uL (ref 4.22–5.81)
RDW: 14.6 % (ref 11.5–15.5)
WBC: 9 10*3/uL (ref 4.0–10.5)
nRBC: 0 % (ref 0.0–0.2)

## 2021-12-15 LAB — TROPONIN I (HIGH SENSITIVITY)
Troponin I (High Sensitivity): 57 ng/L — ABNORMAL HIGH (ref <18)
Troponin I (High Sensitivity): 51 ng/L — ABNORMAL HIGH (ref <18)

## 2021-12-15 LAB — BRAIN NATRIURETIC PEPTIDE: B Natriuretic Peptide: 1878.1 pg/mL — ABNORMAL HIGH (ref 0.0–100.0)

## 2021-12-15 MED ORDER — FUROSEMIDE 10 MG/ML IJ SOLN
40.0000 mg | Freq: Once | INTRAMUSCULAR | Status: AC
  Administered 2021-12-15: 02:00:00 40 mg via INTRAVENOUS
  Filled 2021-12-15: qty 4

## 2021-12-15 NOTE — ED Notes (Signed)
Warm blanket provided.

## 2021-12-15 NOTE — ED Notes (Signed)
Pt verbalizes understanding of discharge instructions. Opportunity for questioning and answers were provided. Pt discharged from ED to home.   ? ?

## 2021-12-15 NOTE — ED Triage Notes (Signed)
Presents with SOB, nausea x1 mo that became substantially worse tonight accompanied by diaphoresis. Has been spitting up phlegm, endorses epigastric discomfort and ankle edema. RA at baseline. Takes eliquis for recent blood clot, Defibrillator in place.

## 2021-12-15 NOTE — Discharge Instructions (Signed)
You were seen today for shortness of breath.  You appear to have some fluid on your lungs.  Return to taking her Lasix daily.  Follow-up with your cardiologist.

## 2021-12-15 NOTE — ED Provider Notes (Signed)
[facility] EMERGENCY DEPT Provider Note   CSN: [id number] Arrival date & time: 12/15/21  0037     History Chief Complaint  Patient presents with   Shortness of Breath    Ryan [name] is a 80 y.o. male.  HPI     This is an 80 year old male with a history of coronary artery disease, CHF, recent COVID-67, hypertension, PE, atrial fibrillation on Xarelto who presents with shortness of breath.  Patient reports he has had ongoing and worsening shortness of breath over the last several weeks.  He states that he gets short of breath when he lies flat.  He has not noted any significant lower extremity swelling but has noted some mild left foot swelling which he has attributed to gout.  Denies any chest pain.  Patient states he had COVID-19 2 months ago and since that time "I been going downhill."  He also had some adjustment in his Lasix he now takes 20 mg every other day.  This is a decrease from his previous.  Patient denies any recent fevers or cough.  He has an AICD but has not noted any shocks.  Past Medical History:  Diagnosis Date   Acquired thrombophilia ([place]) 11/01/2021   Arthritis    CAD (coronary artery disease)    Carotid artery occlusion    Cataract    Bil eyes/worse in left eye   CHF (congestive heart failure) (HCC)    Chronic back pain    COVID-19 10/31/2021   DVT (deep venous thrombosis) (HCC)    Dysrhythmia    Enlarged prostate    takes Rapaflo daily   GERD (gastroesophageal reflux disease)    occasional   History of colon polyps    History of gout    has colchicine prn   History of kidney stones    Hyperlipidemia    takes Crestor daily   Hypertension    takes Amlodipine daily   Hypothyroidism    Myocardial infarction [facility])    Peripheral vascular disease (HCC)    Prolonged QT interval 11/01/2021   Pulmonary emboli ([place]) 03/20/2015   elevated d-dimer, intermediate V/Q study, atypical chest pain and SOB. Start on Xarelto 20mg  BID for 3  month   Rapid atrial fibrillation (HCC)    Renal insufficiency    Shortness of breath dyspnea    Urinary frequency    Urinary urgency     Patient Active Problem List   Diagnosis Date Noted   Complete tear of right rotator cuff 11/21/2021   VT (ventricular tachycardia) 11/18/2021   Orthostatic hypotension 11/12/2021   Nausea 11/12/2021   Aortic stenosis 11/11/2021   Right lower lobe pneumonia 11/01/2021   Prolonged QT interval 11/01/2021   Acquired thrombophilia ([place]) 11/01/2021   Aortic atherosclerosis ([place]) 11/01/2021   Intractable nausea and vomiting 09/10/2021   Hypoxemia    Status post implantation of automatic cardioverter/defibrillator (AICD) 03/22/2021   Ventricular tachycardia 12/28/2020   (HFpEF) heart failure with preserved ejection fraction ([place]) 12/28/2020   NSTEMI (non-ST elevated myocardial infarction) ([place])    Acute respiratory failure with hypoxia ([place])    S/P total hip arthroplasty 08/16/2019   Unilateral primary osteoarthritis, right hip 07/18/2019   Persistent atrial fibrillation ([place]) 01/27/2019   Dizziness 01/27/2019   History of pneumonia 01/27/2019   Stage 3a chronic kidney disease (CKD) ([place]) 01/27/2019   Chest pain 02/07/2018   Elevated troponin 03/23/2017   Sigmoid diverticulitis 03/23/2017   Wrist pain, acute, right    Subclinical hypothyroidism  Dyspnea 02/26/2017   Shoulder blade pain 02/26/2017   SOB (shortness of breath) 02/26/2017   Chronic right shoulder pain    Hyperlipidemia LDL goal <70 01/20/2017   Chronic cholecystitis 07/27/2016   PAD (peripheral artery disease) ([place]) 07/09/2015   S/P CABG x 4 04/05/2015   Atypical chest pain 03/20/2015   Carotid artery disease ([place]) 10/09/2014   PVD (peripheral vascular disease) ([place]) 10/09/2014   HTN (hypertension) 05/30/2014   Arthritis of left hip 01/09/2014   Status post THR (total hip replacement) 01/09/2014   Spinal stenosis, lumbar 01/06/2013    Class: Diagnosis of   Coronary artery  disease 01/05/2013   Atherosclerosis of native artery of extremity with intermittent claudication ([place]) 10/18/2012    Past Surgical History:  Procedure Laterality Date   APPENDECTOMY     BACK SURGERY     5 times   big toe surgery     CARDIAC CATHETERIZATION     2010    dr Acie Fredrickson   cataract surgery     left eye   CHOLECYSTECTOMY N/A 07/27/2016   Procedure: LAPAROSCOPIC CHOLECYSTECTOMY;  Surgeon: Mickeal Skinner, MD;  Location: [place];  Service: General;  Laterality: N/A;   COLONOSCOPY     CORONARY ARTERY BYPASS GRAFT N/A 04/05/2015   Procedure: CORONARY ARTERY BYPASS GRAFTING (CABG)X4 LIMA-LAD; SVG-DIAG1-DIAG2; SVG-PD;  Surgeon: [name], MD;  Location: [place];  Service: Open Heart Surgery;  Laterality: N/A;   CORONARY/GRAFT ANGIOGRAPHY N/A 04/22/2018   Procedure: CORONARY/GRAFT ANGIOGRAPHY;  Surgeon: Martinique, Peter M, MD;  Location: [place] CV LAB;  Service: Cardiovascular;  Laterality: N/A;   CYSTOSCOPY     ENDARTERECTOMY Left 04/24/2016   Procedure: ENDARTERECTOMY LEFT CAROTID;  Surgeon: Mal Misty, MD;  Location: [place];  Service: Vascular;  Laterality: Left;   EYE SURGERY     FEMORAL ARTERY - POPLITEAL ARTERY BYPASS GRAFT     ICD IMPLANT N/A 12/30/2020   Procedure: ICD IMPLANT;  Surgeon: Evans Lance, MD;  Location: [place] CV LAB;  Service: Cardiovascular;  Laterality: N/A;   JOINT REPLACEMENT     shoulder   LEFT HEART CATHETERIZATION WITH CORONARY ANGIOGRAM N/A 04/03/2015   Procedure: LEFT HEART CATHETERIZATION WITH CORONARY ANGIOGRAM;  Surgeon: Troy Sine, MD;  Location[facility] C[facility] LAB;  Service: Cardiovascular;  Laterality: N/A;   LUMBAR LAMINECTOMY  01/06/2013   Procedure: MICRODISCECTOMY LUMBAR LAMINECTOMY;  Surgeon: Marybelle Killings, MD;  Location: [place];  Service: Orthopedics;  Laterality: N/A;  L3-4 decompression   LUMBAR LAMINECTOMY/DECOMPRESSION MICRODISCECTOMY  02/12/2012   Procedure: LUMBAR LAMINECTOMY/DECOMPRESSION MICRODISCECTOMY;  Surgeon: Floyce Stakes, MD;  Location: [place] NEURO ORS;  Service: Neurosurgery;  Laterality: N/A;  Lumbar four-five laminectomy   PATCH ANGIOPLASTY Left 04/24/2016   Procedure: LEFT CAROTID ARTERY PATCH ANGIOPLASTY;  Surgeon: Mal Misty, MD;  Location: [place];  Service: Vascular;  Laterality: Left;   RIGHT HEART CATH AND CORONARY/GRAFT ANGIOGRAPHY N/A 01/30/2019   Procedure: RIGHT HEART CATH AND CORONARY/GRAFT ANGIOGRAPHY;  Surgeon: Troy Sine, MD;  Location: [place] CV LAB;  Service: Cardiovascular;  Laterality: N/A;   STERIOD INJECTION Right 01/09/2014   Procedure: STEROID INJECTION;  Surgeon: Mcarthur Rossetti, MD;  Location: [place];  Service: Orthopedics;  Laterality: Right;   TEE WITHOUT CARDIOVERSION N/A 04/05/2015   Procedure: TRANSESOPHAGEAL ECHOCARDIOGRAM (TEE);  Surgeon: [name], MD;  Location: [place];  Service: Open Heart Surgery;  Laterality: N/A;   TOTAL HIP ARTHROPLASTY Left 01/09/2014   DR  [facility]   TOTAL HIP ARTHROPLASTY Left 01/09/2014   Procedure: LEFT TOTAL HIP ARTHROPLASTY ANTERIOR APPROACH and Steroid Injection Right hip;  Surgeon: Mcarthur Rossetti, MD;  Location: [place];  Service: Orthopedics;  Laterality: Left;   TOTAL HIP ARTHROPLASTY Right 08/15/2019   TOTAL HIP ARTHROPLASTY Right 08/15/2019   Procedure: RIGHT TOTAL HIP ARTHROPLASTY ANTERIOR APPROACH;  Surgeon: Mcarthur Rossetti, MD;  Location: [place];  Service: Orthopedics;  Laterality: Right;       Family History  Problem Relation Age of Onset   Heart disease Father    Heart attack Father    Heart disease Sister    Hypertension Sister    Heart attack Sister    Hypertension Mother    Diabetes Son     Social History   Tobacco Use   Smoking status: Former    Types: Cigarettes    Quit date: 02/04/1987    Years since quitting: 34.8   Smokeless tobacco: Former    Types: Chew    Quit date: 07/20/2009   Tobacco comments:    quit 35+yrs ago  Vaping Use   Vaping Use: Never used  Substance Use  Topics   Alcohol use: No    Alcohol/week: 0.0 standard drinks   Drug use: No    Home Medications Prior to Admission medications   Medication Sig Start Date End Date Taking? Authorizing Provider  acetaminophen (TYLENOL) 325 MG tablet Take 2 tablets (650 mg total) by mouth every 6 (six) hours as needed for mild pain (or Fever >/= 101). 11/19/21   Raiford Noble Latif, DO  allopurinol (ZYLOPRIM) 100 MG tablet Take 100 mg by mouth daily as needed (gout). 05/29/20   [provider]  ALPRAZolam Duanne Moron) 0.5 MG tablet Take 0.5 mg by mouth 3 (three) times daily as needed for anxiety or sleep. 10/07/21   [provider]  amiodarone (PACERONE) 200 MG tablet Take 1 tablet (200 mg total) by mouth daily. 09/11/21   Mercy Riding, MD  apixaban (ELIQUIS) 5 MG TABS tablet Take 1 tablet (5 mg total) by mouth 2 (two) times daily. 11/12/21 11/12/22  [name] T, PA-C  bisoprolol (ZEBETA) 5 MG tablet Take 0.5 tablets (2.5 mg total) by mouth daily. 11/12/21 11/12/22  [name] T, PA-C  finasteride (PROSCAR) 5 MG tablet Take 5 mg by mouth daily. 08/18/21   [provider]  furosemide (LASIX) 20 MG tablet Take 1 tablet (20 mg total) by mouth every other day. 11/12/21 05/11/22  [name] T, PA-C  levothyroxine (SYNTHROID) 25 MCG tablet Take 25 mcg by mouth daily. 07/02/21   [provider]  metoCLOPramide (REGLAN) 5 MG tablet Take 1 tablet (5 mg total) by mouth every 6 (six) hours as needed for nausea. 11/13/21   Carlisle Cater, PA-C  nitroGLYCERIN (NITROSTAT) 0.4 MG SL tablet Place 1 tablet (0.4 mg total) under the tongue every 5 (five) minutes as needed for chest pain. 07/09/21   Evans Lance, MD  ondansetron (ZOFRAN-ODT) 4 MG disintegrating tablet Take 4 mg by mouth every 6 (six) hours as needed for nausea/vomiting. 10/30/21   [provider]  pantoprazole (PROTONIX) 40 MG tablet Take 1 tablet (40 mg total) by mouth daily. 11/12/21   [name] T, PA-C   polyethylene glycol (MIRALAX / GLYCOLAX) 17 g packet Take 17 g by mouth 3 (three) times daily.    [provider]  REPATHA SURECLICK 277 MG/ML SOAJ INJECT 1 PEN INTO THE SKIN EVERY 14 DAYS. 09/16/21  [name], [name], MD  senna-docusate (SENOKOT-S) 8.6-50 MG tablet Take 1 tablet by mouth at bedtime. 11/19/21   Raiford Noble Latif, DO  spironolactone (ALDACTONE) 25 MG tablet Take 0.5 tablets (12.5 mg total) by mouth daily. 10/16/21 10/11/22  Loel Dubonnet, NP    Allergies    Jardiance [empagliflozin], Ezetimibe, Ace inhibitors, Aspirin, Atorvastatin, Ezetimibe-simvastatin, Codeine, and Unithroid [levothyroxine sodium]  Review of Systems   Review of Systems  Constitutional:  Negative for fever.  Respiratory:  Positive for shortness of breath. Negative for cough.   Cardiovascular:  Positive for leg swelling. Negative for chest pain.  Gastrointestinal:  Negative for abdominal pain, diarrhea, nausea and vomiting.  All other systems reviewed and are negative.  Physical Exam Updated Vital Signs BP (!) 169/97    Pulse (!) 58    Temp (!) 96.6 F (35.9 C) (Axillary)    Resp 19    SpO2 94%   Physical Exam Vitals and nursing note reviewed.  Constitutional:      Appearance: He is well-developed. He is not ill-appearing.  HENT:     Head: Normocephalic and atraumatic.     Mouth/Throat:     Mouth: Mucous membranes are moist.  Eyes:     Pupils: Pupils are equal, round, and reactive to light.  Cardiovascular:     Rate and Rhythm: Normal rate. Rhythm irregular.     Heart sounds: Normal heart sounds. No murmur heard. Pulmonary:     Effort: Pulmonary effort is normal. No tachypnea or respiratory distress.     Breath sounds: Normal breath sounds. No decreased breath sounds or wheezing.  Abdominal:     General: Bowel sounds are normal.     Palpations: Abdomen is soft.     Tenderness: There is no abdominal tenderness. There is no rebound.  Musculoskeletal:     Cervical back: Neck  supple.     Right lower leg: Edema present.     Left lower leg: Edema present.     Comments: Trace bilateral lower extremity edema  Lymphadenopathy:     Cervical: No cervical adenopathy.  Skin:    General: Skin is warm and dry.  Neurological:     Mental Status: He is alert and oriented to person, place, and time.  Psychiatric:        Mood and Affect: Mood is anxious.    ED Results / Procedures / Treatments   Labs (all labs ordered are listed, but only abnormal results are displayed) Labs Reviewed  BASIC METABOLIC PANEL - Abnormal; Notable for the following components:      Result Value   Glucose, Bld 113 (*)    All other components within normal limits  BRAIN NATRIURETIC PEPTIDE - Abnormal; Notable for the following components:   B Natriuretic Peptide 1,878.1 (*)    All other components within normal limits  TROPONIN I (HIGH SENSITIVITY) - Abnormal; Notable for the following components:   Troponin I (High Sensitivity) 57 (*)    All other components within normal limits  RESP PANEL BY RT-PCR (FLU A&B, COVID) ARPGX2  CBC WITH DIFFERENTIAL/PLATELET  TROPONIN I (HIGH SENSITIVITY)    EKG EKG Interpretation  Date/Time:  Monday December 15 2021 00:46:14 EST Ventricular Rate:  72 PR Interval:    QRS Duration: 102 QT Interval:  487 QTC Calculation: 533 R Axis:   84 Text Interpretation: Atrial fib/Atrial flutter Borderline right axis deviation Low voltage, extremity leads Nonspecific T abnormalities, lateral leads Prolonged QT interval similar to prior Confirmed by Ernest Popowski,  Loma Sousa [phone]) on 12/15/2021 12:48:59 AM  Radiology DG Chest Portable 1 View  Result Date: 12/15/2021 CLINICAL DATA:  Shortness of breath EXAM: PORTABLE CHEST 1 VIEW COMPARISON:  11/17/2021 FINDINGS: Cardiac shadow is enlarged. Postsurgical changes are noted. Defibrillator is again seen. Postsurgical changes in the left shoulder are noted and stable. Fractured fixation screw is noted in the scapula. Mild  vascular congestion is noted with edema. No focal infiltrate is seen. IMPRESSION: Mild pulmonary edema. Electronically Signed   By: Inez Catalina M.D.   On: 12/15/2021 01:40    Procedures .Critical Care Performed by: Merryl Hacker, MD Authorized by: Merryl Hacker, MD   Critical care provider statement:    Critical care time (minutes):  30   Critical care was necessary to treat or prevent imminent or life-threatening deterioration of the following conditions:  Cardiac failure   Critical care was time spent personally by me on the following activities:  Development of treatment plan with patient or surrogate, discussions with consultants, evaluation of patient's response to treatment, examination of patient, ordering and review of laboratory studies, ordering and review of radiographic studies, ordering and performing treatments and interventions, pulse oximetry, re-evaluation of patient's condition and review of old charts   Medications Ordered in ED Medications  furosemide (LASIX) injection 40 mg (40 mg Intravenous Given 12/15/21 0221)    ED Course  I have reviewed the triage vital signs and the nursing notes.  Pertinent labs & imaging results that were available during my care of the patient were reviewed by me and considered in my medical decision making (see chart for details).  Clinical Course as of 12/15/21 6294  [facility] Dec 15, 2021  [place] interrogation without any acute events.  Confirms likely mild volume overload.  Patient ambulated with pulse ox without difficulty.  States he feels much better after receiving Lasix. [CH]    Clinical Course User Index [CH] Newt Levingston, Barbette Hair, MD   MDM Rules/Calculators/A&P                          Patient presents with shortness of breath.  He is overall nontoxic and vital signs are reassuring.  He is in no respiratory distress.  Patient is very anxious regarding his symptoms.  EKG shows atrial fibs/flutter which is rate  controlled.  Troponin is 57.  This is consistent with his prior troponins which have ranged from 40-60.  We will repeat.  Chest x-ray shows some mild pulmonary edema and BNP is elevated.  Suspect given history of orthopnea, he may have some mild volume overload.  Suspect this may be the cause of his shortness of breath.  Doubt pneumonia.  He is compliant with his Xarelto so doubt PE.  Patient was given 40 mg of IV Lasix with good diuresis.  See clinical course above.  AICD was interrogated.  Patient feels significantly better after diuresis.  He ambulates and maintains his O2 sats.  Troponin is stable.  Will discharge home with close cardiology follow-up.  Recommend patient resume daily Lasix until cardiology follow-up.  After history, exam, and medical workup I feel the patient has been appropriately medically screened and is safe for discharge home. Pertinent diagnoses were discussed with the patient. Patient was given return precautions.      Final Clinical Impression(s) / ED Diagnoses Final diagnoses:  SOB (shortness of breath)  Acute on chronic systolic heart failure ([place])    Rx / DC Orders ED  Discharge Orders     None        Merryl Hacker, MD 12/15/21 [phone]

## 2021-12-15 NOTE — ED Notes (Addendum)
Representative from [place] to perform interrogation and reports that there have been no new episodes.

## 2021-12-15 NOTE — ED Notes (Signed)
Ambulatory O2 saturation 90-93%. No difficulty noted with ambulation. Pt states "I feel good, when can I go home?"

## 2021-12-16 DIAGNOSIS — I5023 Acute on chronic systolic (congestive) heart failure: Secondary | ICD-10-CM | POA: Diagnosis not present

## 2021-12-16 DIAGNOSIS — R0609 Other forms of dyspnea: Secondary | ICD-10-CM | POA: Diagnosis not present

## 2021-12-16 DIAGNOSIS — I480 Paroxysmal atrial fibrillation: Secondary | ICD-10-CM | POA: Diagnosis not present

## 2021-12-16 DIAGNOSIS — R42 Dizziness and giddiness: Secondary | ICD-10-CM | POA: Diagnosis not present

## 2021-12-16 DIAGNOSIS — R110 Nausea: Secondary | ICD-10-CM | POA: Diagnosis not present

## 2021-12-16 DIAGNOSIS — F419 Anxiety disorder, unspecified: Secondary | ICD-10-CM | POA: Diagnosis not present

## 2021-12-19 DIAGNOSIS — N1831 Chronic kidney disease, stage 3a: Secondary | ICD-10-CM | POA: Diagnosis not present

## 2021-12-19 DIAGNOSIS — I130 Hypertensive heart and chronic kidney disease with heart failure and stage 1 through stage 4 chronic kidney disease, or unspecified chronic kidney disease: Secondary | ICD-10-CM | POA: Diagnosis not present

## 2021-12-19 DIAGNOSIS — E785 Hyperlipidemia, unspecified: Secondary | ICD-10-CM | POA: Diagnosis not present

## 2021-12-19 DIAGNOSIS — I5022 Chronic systolic (congestive) heart failure: Secondary | ICD-10-CM | POA: Diagnosis not present

## 2021-12-23 ENCOUNTER — Emergency Department (HOSPITAL_COMMUNITY): Payer: Medicare Other

## 2021-12-23 ENCOUNTER — Emergency Department (HOSPITAL_COMMUNITY)
Admission: EM | Admit: 2021-12-23 | Discharge: 2021-12-24 | Disposition: A | Discharge status: Home / Self Care | Payer: Medicare Other | Source: Home / Self Care | Attending: Emergency Medicine | Admitting: Emergency Medicine

## 2021-12-23 ENCOUNTER — Encounter (HOSPITAL_COMMUNITY): Payer: Self-pay

## 2021-12-23 ENCOUNTER — Other Ambulatory Visit: Payer: Self-pay

## 2021-12-23 DIAGNOSIS — R0602 Shortness of breath: Secondary | ICD-10-CM | POA: Diagnosis not present

## 2021-12-23 DIAGNOSIS — Z8616 Personal history of COVID-19: Secondary | ICD-10-CM | POA: Diagnosis not present

## 2021-12-23 DIAGNOSIS — M549 Dorsalgia, unspecified: Secondary | ICD-10-CM | POA: Diagnosis present

## 2021-12-23 DIAGNOSIS — I5031 Acute diastolic (congestive) heart failure: Secondary | ICD-10-CM | POA: Diagnosis not present

## 2021-12-23 DIAGNOSIS — R079 Chest pain, unspecified: Secondary | ICD-10-CM | POA: Diagnosis not present

## 2021-12-23 DIAGNOSIS — I25118 Atherosclerotic heart disease of native coronary artery with other forms of angina pectoris: Secondary | ICD-10-CM | POA: Diagnosis not present

## 2021-12-23 DIAGNOSIS — N1831 Chronic kidney disease, stage 3a: Secondary | ICD-10-CM | POA: Diagnosis not present

## 2021-12-23 DIAGNOSIS — I25119 Atherosclerotic heart disease of native coronary artery with unspecified angina pectoris: Secondary | ICD-10-CM | POA: Diagnosis present

## 2021-12-23 DIAGNOSIS — I25810 Atherosclerosis of coronary artery bypass graft(s) without angina pectoris: Secondary | ICD-10-CM | POA: Diagnosis not present

## 2021-12-23 DIAGNOSIS — I350 Nonrheumatic aortic (valve) stenosis: Secondary | ICD-10-CM | POA: Diagnosis not present

## 2021-12-23 DIAGNOSIS — G8929 Other chronic pain: Secondary | ICD-10-CM | POA: Diagnosis present

## 2021-12-23 DIAGNOSIS — R0789 Other chest pain: Secondary | ICD-10-CM | POA: Diagnosis not present

## 2021-12-23 DIAGNOSIS — R110 Nausea: Secondary | ICD-10-CM | POA: Diagnosis not present

## 2021-12-23 DIAGNOSIS — J90 Pleural effusion, not elsewhere classified: Secondary | ICD-10-CM | POA: Diagnosis not present

## 2021-12-23 DIAGNOSIS — I252 Old myocardial infarction: Secondary | ICD-10-CM | POA: Diagnosis not present

## 2021-12-23 DIAGNOSIS — I130 Hypertensive heart and chronic kidney disease with heart failure and stage 1 through stage 4 chronic kidney disease, or unspecified chronic kidney disease: Secondary | ICD-10-CM | POA: Diagnosis present

## 2021-12-23 DIAGNOSIS — E785 Hyperlipidemia, unspecified: Secondary | ICD-10-CM | POA: Diagnosis present

## 2021-12-23 DIAGNOSIS — R338 Other retention of urine: Secondary | ICD-10-CM | POA: Diagnosis present

## 2021-12-23 DIAGNOSIS — I5032 Chronic diastolic (congestive) heart failure: Secondary | ICD-10-CM | POA: Diagnosis not present

## 2021-12-23 DIAGNOSIS — I110 Hypertensive heart disease with heart failure: Secondary | ICD-10-CM | POA: Insufficient documentation

## 2021-12-23 DIAGNOSIS — I480 Paroxysmal atrial fibrillation: Secondary | ICD-10-CM | POA: Diagnosis not present

## 2021-12-23 DIAGNOSIS — I509 Heart failure, unspecified: Secondary | ICD-10-CM | POA: Insufficient documentation

## 2021-12-23 DIAGNOSIS — I4891 Unspecified atrial fibrillation: Secondary | ICD-10-CM | POA: Diagnosis not present

## 2021-12-23 DIAGNOSIS — I2510 Atherosclerotic heart disease of native coronary artery without angina pectoris: Secondary | ICD-10-CM | POA: Insufficient documentation

## 2021-12-23 DIAGNOSIS — K219 Gastro-esophageal reflux disease without esophagitis: Secondary | ICD-10-CM | POA: Diagnosis present

## 2021-12-23 DIAGNOSIS — I5043 Acute on chronic combined systolic (congestive) and diastolic (congestive) heart failure: Secondary | ICD-10-CM | POA: Diagnosis present

## 2021-12-23 DIAGNOSIS — M109 Gout, unspecified: Secondary | ICD-10-CM | POA: Diagnosis present

## 2021-12-23 DIAGNOSIS — Z86718 Personal history of other venous thrombosis and embolism: Secondary | ICD-10-CM | POA: Diagnosis not present

## 2021-12-23 DIAGNOSIS — I739 Peripheral vascular disease, unspecified: Secondary | ICD-10-CM | POA: Diagnosis present

## 2021-12-23 DIAGNOSIS — Z20822 Contact with and (suspected) exposure to covid-19: Secondary | ICD-10-CM | POA: Diagnosis present

## 2021-12-23 DIAGNOSIS — N401 Enlarged prostate with lower urinary tract symptoms: Secondary | ICD-10-CM | POA: Diagnosis present

## 2021-12-23 DIAGNOSIS — I4819 Other persistent atrial fibrillation: Secondary | ICD-10-CM | POA: Diagnosis present

## 2021-12-23 DIAGNOSIS — I4892 Unspecified atrial flutter: Secondary | ICD-10-CM | POA: Diagnosis present

## 2021-12-23 DIAGNOSIS — I5023 Acute on chronic systolic (congestive) heart failure: Secondary | ICD-10-CM | POA: Diagnosis not present

## 2021-12-23 DIAGNOSIS — M1990 Unspecified osteoarthritis, unspecified site: Secondary | ICD-10-CM | POA: Diagnosis present

## 2021-12-23 DIAGNOSIS — E8770 Fluid overload, unspecified: Secondary | ICD-10-CM | POA: Diagnosis not present

## 2021-12-23 DIAGNOSIS — I517 Cardiomegaly: Secondary | ICD-10-CM | POA: Diagnosis not present

## 2021-12-23 DIAGNOSIS — E039 Hypothyroidism, unspecified: Secondary | ICD-10-CM | POA: Diagnosis present

## 2021-12-23 DIAGNOSIS — J9811 Atelectasis: Secondary | ICD-10-CM | POA: Diagnosis not present

## 2021-12-23 DIAGNOSIS — F419 Anxiety disorder, unspecified: Secondary | ICD-10-CM | POA: Diagnosis not present

## 2021-12-23 DIAGNOSIS — R42 Dizziness and giddiness: Secondary | ICD-10-CM | POA: Diagnosis not present

## 2021-12-23 DIAGNOSIS — R0609 Other forms of dyspnea: Secondary | ICD-10-CM | POA: Diagnosis not present

## 2021-12-23 DIAGNOSIS — D6859 Other primary thrombophilia: Secondary | ICD-10-CM | POA: Diagnosis present

## 2021-12-23 DIAGNOSIS — Z8249 Family history of ischemic heart disease and other diseases of the circulatory system: Secondary | ICD-10-CM | POA: Diagnosis not present

## 2021-12-23 LAB — CBC WITH DIFFERENTIAL/PLATELET
Abs Immature Granulocytes: 0.03 10*3/uL (ref 0.00–0.07)
Basophils Absolute: 0 10*3/uL (ref 0.0–0.1)
Basophils Relative: 0 %
Eosinophils Absolute: 0 10*3/uL (ref 0.0–0.5)
Eosinophils Relative: 1 %
HCT: 46.2 % (ref 39.0–52.0)
Hemoglobin: 14.7 g/dL (ref 13.0–17.0)
Immature Granulocytes: 0 %
Lymphocytes Relative: 22 %
Lymphs Abs: 1.8 10*3/uL (ref 0.7–4.0)
MCH: 32 pg (ref 26.0–34.0)
MCHC: 31.8 g/dL (ref 30.0–36.0)
MCV: 100.4 fL — ABNORMAL HIGH (ref 80.0–100.0)
Monocytes Absolute: 0.7 10*3/uL (ref 0.1–1.0)
Monocytes Relative: 9 %
Neutro Abs: 5.5 10*3/uL (ref 1.7–7.7)
Neutrophils Relative %: 68 %
Platelets: 279 10*3/uL (ref 150–400)
RBC: 4.6 MIL/uL (ref 4.22–5.81)
RDW: 14.5 % (ref 11.5–15.5)
WBC: 8.1 10*3/uL (ref 4.0–10.5)
nRBC: 0 % (ref 0.0–0.2)

## 2021-12-23 LAB — RESP PANEL BY RT-PCR (FLU A&B, COVID) ARPGX2
Influenza A by PCR: NEGATIVE
Influenza B by PCR: NEGATIVE
SARS Coronavirus 2 by RT PCR: NEGATIVE

## 2021-12-23 LAB — BASIC METABOLIC PANEL
Anion gap: 8 (ref 5–15)
BUN: 17 mg/dL (ref 8–23)
CO2: 29 mmol/L (ref 22–32)
Calcium: 8.7 mg/dL — ABNORMAL LOW (ref 8.9–10.3)
Chloride: 101 mmol/L (ref 98–111)
Creatinine, Ser: 1.48 mg/dL — ABNORMAL HIGH (ref 0.61–1.24)
GFR, Estimated: 48 mL/min — ABNORMAL LOW (ref >60)
Glucose, Bld: 106 mg/dL — ABNORMAL HIGH (ref 70–99)
Potassium: 4.1 mmol/L (ref 3.5–5.1)
Sodium: 138 mmol/L (ref 135–145)

## 2021-12-23 LAB — BRAIN NATRIURETIC PEPTIDE: B Natriuretic Peptide: 1115.7 pg/mL — ABNORMAL HIGH (ref 0.0–100.0)

## 2021-12-23 LAB — TROPONIN I (HIGH SENSITIVITY)
Troponin I (High Sensitivity): 45 ng/L — ABNORMAL HIGH (ref <18)
Troponin I (High Sensitivity): 42 ng/L — ABNORMAL HIGH (ref <18)

## 2021-12-23 MED ORDER — FUROSEMIDE 10 MG/ML IJ SOLN
40.0000 mg | Freq: Once | INTRAMUSCULAR | Status: AC
  Administered 2021-12-23: 40 mg via INTRAVENOUS
  Filled 2021-12-23: qty 4

## 2021-12-23 NOTE — ED Provider Notes (Signed)
Emergency Department Provider Note   I have reviewed the triage vital signs and the nursing notes.   HISTORY  Chief Complaint Chest Pain and Shortness of Breath   HPI Ryan [name] is a 81 y.o. male with past medical history of CHF on Lasix presents with dyspnea with exertion and orthopnea.  He describes intermittent shortness of breath type symptoms worse in the morning.  He tells me that he has been coordinating with his primary care doctor, [name], with appointments on Friday and also today.  His Lasix was increased on Friday of this past week and has been taking it through the weekend.  He had a follow-up appointment today and the plan was to increase his Lasix further but he ultimately decided to seek evaluation in the emergency department.  He is not having chest pain or pressure.  No fevers or chills.  His symptoms are worse with exertion and laying flat although he is able to do so at night.     Past Medical History:  Diagnosis Date   Acquired thrombophilia ([place]) 11/01/2021   Arthritis    CAD (coronary artery disease)    Carotid artery occlusion    Cataract    Bil eyes/worse in left eye   CHF (congestive heart failure) (HCC)    Chronic back pain    COVID-19 10/31/2021   DVT (deep venous thrombosis) (HCC)    Dysrhythmia    Enlarged prostate    takes Rapaflo daily   GERD (gastroesophageal reflux disease)    occasional   History of colon polyps    History of gout    has colchicine prn   History of kidney stones    Hyperlipidemia    takes Crestor daily   Hypertension    takes Amlodipine daily   Hypothyroidism    Myocardial infarction [facility])    Peripheral vascular disease (HCC)    Prolonged QT interval 11/01/2021   Pulmonary emboli ([place]) 03/20/2015   elevated d-dimer, intermediate V/Q study, atypical chest pain and SOB. Start on Xarelto 20mg  BID for 3 month   Rapid atrial fibrillation (HCC)    Renal insufficiency    Shortness of breath dyspnea    Urinary  frequency    Urinary urgency     Review of Systems  Constitutional: No fever/chills Eyes: No visual changes. ENT: No sore throat. Cardiovascular: Denies chest pain. Respiratory: Positive shortness of breath. Gastrointestinal: No abdominal pain.  No nausea, no vomiting.  No diarrhea.  No constipation. Genitourinary: Negative for dysuria. Musculoskeletal: Negative for back pain. Skin: Negative for rash. Neurological: Negative for headaches, focal weakness or numbness.  10-point ROS otherwise negative.  ____________________________________________   PHYSICAL EXAM:  VITAL SIGNS: ED Triage Vitals  Enc Vitals Group     BP 12/23/21 1440 (!) 157/92     Pulse Rate 12/23/21 1440 66     Resp 12/23/21 1440 18     Temp 12/23/21 1440 97.7 F (36.5 C)     Temp Source 12/23/21 1440 Oral     SpO2 12/23/21 1440 98 %     Weight 12/23/21 1441 147 lb (66.7 kg)     Height 12/23/21 1441 5\' 6"  (1.676 m)   Constitutional: Alert and oriented. Well appearing and in no acute distress. Eyes: Conjunctivae are normal.  Head: Atraumatic. Nose: No congestion/rhinnorhea. Mouth/Throat: Mucous membranes are moist.   Neck: No stridor.  Cardiovascular: Normal rate, regular rhythm. Good peripheral circulation. Grossly normal heart sounds.   Respiratory: Normal respiratory effort.  No retractions. Lungs with crackles at the bases.  Gastrointestinal: Soft and nontender. No distention.  Musculoskeletal: No lower extremity tenderness nor edema. No gross deformities of extremities. Neurologic:  Normal speech and language. No gross focal neurologic deficits are appreciated.  Skin:  Skin is warm, dry and intact. No rash noted.   ____________________________________________   LABS (all labs ordered are listed, but only abnormal results are displayed)  Labs Reviewed  BASIC METABOLIC PANEL - Abnormal; Notable for the following components:      Result Value   Glucose, Bld 106 (*)    Creatinine, Ser 1.48  (*)    Calcium 8.7 (*)    GFR, Estimated 48 (*)    All other components within normal limits  CBC WITH DIFFERENTIAL/PLATELET - Abnormal; Notable for the following components:   MCV 100.4 (*)    All other components within normal limits  BRAIN NATRIURETIC PEPTIDE - Abnormal; Notable for the following components:   B Natriuretic Peptide 1,115.7 (*)    All other components within normal limits  TROPONIN I (HIGH SENSITIVITY) - Abnormal; Notable for the following components:   Troponin I (High Sensitivity) 42 (*)    All other components within normal limits  TROPONIN I (HIGH SENSITIVITY) - Abnormal; Notable for the following components:   Troponin I (High Sensitivity) 45 (*)    All other components within normal limits  RESP PANEL BY RT-PCR (FLU A&B, COVID) [id number]   ____________________________________________  EKG   EKG Interpretation  Date/Time:  Tuesday December 23 2021 14:48:10 EST Ventricular Rate:  57 PR Interval:    QRS Duration: 90 QT Interval:  512 QTC Calculation: 498 R Axis:   79 Text Interpretation: Atrial fibrillation with slow ventricular response with occasional ventricular-paced complexes Cannot rule out Anterior infarct , age undetermined Abnormal ECG When compared with ECG of 15-Dec-2021 00:46, PREVIOUS ECG IS PRESENT Similar to prior Confirmed by Nanda Quinton [phone]) on 12/23/2021 9:24:51 PM        ____________________________________________  RADIOLOGY  DG Chest 2 View  Result Date: 12/23/2021 CLINICAL DATA:  Chest pain and shortness of breath today. EXAM: CHEST - 2 VIEW COMPARISON:  12/15/2021 FINDINGS: Pacemaker/AICD appears the same. Previous median sternotomy and CABG. Chronic interstitial lung markings. Slight worsening of atelectasis at both lung bases today. Small bilateral effusions. Findings could be due to pneumonia and/or congestive heart failure. IMPRESSION: Small bilateral effusions. Patchy density at both lung bases that could be pneumonia or  atelectasis. Electronically Signed   By: Nelson Chimes M.D.   On: 12/23/2021 15:30    ____________________________________________   PROCEDURES  Procedure(s) performed:   Procedures  None ____________________________________________   INITIAL IMPRESSION / ASSESSMENT AND PLAN / ED COURSE  Pertinent labs & imaging results that were available during my care of the patient were reviewed by me and considered in my medical decision making (see chart for details).   This patient is Presenting for Evaluation of SOB and CP, which does require a range of treatment options, and is a complaint that involves a high risk of morbidity and mortality.  The Differential Diagnoses Differential includes all life-threatening causes for chest pain. This includes but is not exclusive to acute coronary syndrome, aortic dissection, pulmonary embolism, cardiac tamponade, community-acquired pneumonia, pericarditis, musculoskeletal chest wall pain, etc.   Medical Decision Making: Summary:  Patient returns to the ED with SOB and CP. BNP elevated but similar to prior. Patient overall looks very well. No hypoxemia. No increased WOB. Troponin mildly elevated but flat.  EKG interpreted by me as above. Patient ambulatory in the ED without hypoxemia. Had made plan with his PCP for increased lasix and follow up but patient has not as of yet started the new protocol. Offered admit for diuresis but along think it reasonable to try new med dosing at home. Patient electing for meds and PCP follow up scheduled for this week. Patient requesting contact information for new Cardiology group. Gave Psychologist, counselling for [employer].       No data found.      Critical Interventions- lasix, CXR, and labs; to evaluate  Chief Complaint  Patient presents with   Chest Pain   Shortness of Breath    and assess for illness characterized as Acute, Previously Undiagnosed, Uncertain Prognosis, Complicated, Systemic Symptoms, and Threat  to Life/Bodily Function   After These Interventions, the Patient was reevaluated and was found to have good diuresis in the ED. He is comfortable, not on O2, and ambulatory without difficulty.         I did Additional Historical Information from patient's wife and outpatient lab work and PCP diuresis plan.  I decided to review pertinent External Data, and in summary including recent ECHO and Cardiology outpatient notes. .   Clinical Laboratory Tests Ordered, included CBC, Metabolic panel, and BNP, and Troponin . Review indicates elevated BNP similar to last ED visit but patient not appearing volume overloaded.   Radiologic Tests Ordered, included CXR.  I independently Visualized: CXR Images, which show no pulmonary edema or large effusion. Favor atelectasis at the bases clinically.    Cardiac Monitor Tracing which shows rate controlled A fib.    ____________________________________________  FINAL CLINICAL IMPRESSION(S) / ED DIAGNOSES  Final diagnoses:  Shortness of breath     MEDICATIONS GIVEN DURING THIS VISIT:  Medications  furosemide (LASIX) injection 40 mg (40 mg Intravenous Given 12/23/21 2237)    Note:  This document was prepared using Dragon voice recognition software and may include unintentional dictation errors.  Nanda Quinton, MD, [facility] Emergency Medicine    Sya Nestler, [facility], MD 12/28/21 [phone]

## 2021-12-23 NOTE — ED Notes (Signed)
Pt ambulated, O2 stats stayed over 95% with HR in 90's.

## 2021-12-23 NOTE — ED Provider Notes (Signed)
Emergency Medicine Provider Triage Evaluation Note  Ryan [name] , a 81 y.o. male  was evaluated in triage.  Pt complains of sent in by his PCP for shortness of breath.  History of severe CHF AICD in place, 1 month of worsening PND, orthopnea and shortness of breath.  He has been diuresed in the ED previously but the next day his swelling returned.  Taking Lasix.  Sees Dr. Acie Fredrickson.  Review of Systems  Positive: Chest pain, shortness of breath and exertional dyspnea Negative: Fever cough chills  Physical Exam  There were no vitals taken for this visit. Gen:   Awake, no distress   Resp:  Normal effort  MSK:   Moves extremities without difficulty  Other:  No pitting edema in the lower extremity  Medical Decision Making  Medically screening exam initiated at 2:39 PM.  Appropriate orders placed.  Ryan [name] was informed that the remainder of the evaluation will be completed by another provider, this initial triage assessment does not replace that evaluation, and the importance of remaining in the ED until their evaluation is complete.  Work-up initiated   Margarita Mail, PA-C 24/11/46 [id number]    Campbell Stall P, DO 27/67/01 [id number]

## 2021-12-23 NOTE — Discharge Instructions (Signed)
Please take your lasix as discussed with [name]. Call for follow up this week. Return with any new or worsening symptoms.

## 2021-12-23 NOTE — ED Triage Notes (Signed)
Pt arrived POV from the drs office c/o worsening SHOB and CP. Pt states hes been seen 4 times and had fluid pulled off but he is still short of breath.

## 2021-12-25 ENCOUNTER — Encounter: Payer: Self-pay | Admitting: Cardiology

## 2021-12-25 ENCOUNTER — Encounter (HOSPITAL_COMMUNITY): Payer: Self-pay

## 2021-12-25 ENCOUNTER — Emergency Department (HOSPITAL_COMMUNITY): Payer: Medicare Other

## 2021-12-25 ENCOUNTER — Inpatient Hospital Stay (HOSPITAL_COMMUNITY)
Admission: EM | Admit: 2021-12-25 | Discharge: 2021-12-28 | DRG: 291 | Disposition: A | Discharge status: Home / Self Care | Payer: Medicare Other | Source: Ambulatory Visit | Attending: Cardiology | Admitting: Cardiology

## 2021-12-25 ENCOUNTER — Other Ambulatory Visit: Payer: Self-pay

## 2021-12-25 ENCOUNTER — Ambulatory Visit (HOSPITAL_BASED_OUTPATIENT_CLINIC_OR_DEPARTMENT_OTHER): Payer: Medicare Other | Admitting: Family

## 2021-12-25 ENCOUNTER — Ambulatory Visit: Payer: Medicare Other | Admitting: Cardiology

## 2021-12-25 ENCOUNTER — Other Ambulatory Visit: Payer: Self-pay | Admitting: *Deleted

## 2021-12-25 VITALS — BP 138/77 | HR 59 | Temp 98.0°F | Resp 16 | Ht 66.0 in | Wt 140.0 lb

## 2021-12-25 DIAGNOSIS — I5031 Acute diastolic (congestive) heart failure: Secondary | ICD-10-CM | POA: Diagnosis present

## 2021-12-25 DIAGNOSIS — G8929 Other chronic pain: Secondary | ICD-10-CM | POA: Diagnosis present

## 2021-12-25 DIAGNOSIS — M1990 Unspecified osteoarthritis, unspecified site: Secondary | ICD-10-CM | POA: Diagnosis present

## 2021-12-25 DIAGNOSIS — M549 Dorsalgia, unspecified: Secondary | ICD-10-CM | POA: Diagnosis present

## 2021-12-25 DIAGNOSIS — I25810 Atherosclerosis of coronary artery bypass graft(s) without angina pectoris: Secondary | ICD-10-CM | POA: Diagnosis not present

## 2021-12-25 DIAGNOSIS — Z833 Family history of diabetes mellitus: Secondary | ICD-10-CM

## 2021-12-25 DIAGNOSIS — N1831 Chronic kidney disease, stage 3a: Secondary | ICD-10-CM | POA: Diagnosis present

## 2021-12-25 DIAGNOSIS — E039 Hypothyroidism, unspecified: Secondary | ICD-10-CM | POA: Diagnosis present

## 2021-12-25 DIAGNOSIS — Z7901 Long term (current) use of anticoagulants: Secondary | ICD-10-CM

## 2021-12-25 DIAGNOSIS — I4819 Other persistent atrial fibrillation: Secondary | ICD-10-CM | POA: Diagnosis present

## 2021-12-25 DIAGNOSIS — D6859 Other primary thrombophilia: Secondary | ICD-10-CM | POA: Diagnosis present

## 2021-12-25 DIAGNOSIS — Z20822 Contact with and (suspected) exposure to covid-19: Secondary | ICD-10-CM | POA: Diagnosis present

## 2021-12-25 DIAGNOSIS — I25119 Atherosclerotic heart disease of native coronary artery with unspecified angina pectoris: Secondary | ICD-10-CM | POA: Diagnosis present

## 2021-12-25 DIAGNOSIS — Z95810 Presence of automatic (implantable) cardiac defibrillator: Secondary | ICD-10-CM

## 2021-12-25 DIAGNOSIS — I350 Nonrheumatic aortic (valve) stenosis: Secondary | ICD-10-CM

## 2021-12-25 DIAGNOSIS — Z885 Allergy status to narcotic agent status: Secondary | ICD-10-CM

## 2021-12-25 DIAGNOSIS — R079 Chest pain, unspecified: Secondary | ICD-10-CM

## 2021-12-25 DIAGNOSIS — Z79890 Hormone replacement therapy (postmenopausal): Secondary | ICD-10-CM

## 2021-12-25 DIAGNOSIS — N401 Enlarged prostate with lower urinary tract symptoms: Secondary | ICD-10-CM | POA: Diagnosis present

## 2021-12-25 DIAGNOSIS — R338 Other retention of urine: Secondary | ICD-10-CM | POA: Diagnosis present

## 2021-12-25 DIAGNOSIS — Z888 Allergy status to other drugs, medicaments and biological substances status: Secondary | ICD-10-CM

## 2021-12-25 DIAGNOSIS — I255 Ischemic cardiomyopathy: Secondary | ICD-10-CM | POA: Diagnosis present

## 2021-12-25 DIAGNOSIS — Z86718 Personal history of other venous thrombosis and embolism: Secondary | ICD-10-CM

## 2021-12-25 DIAGNOSIS — E785 Hyperlipidemia, unspecified: Secondary | ICD-10-CM | POA: Diagnosis present

## 2021-12-25 DIAGNOSIS — I4892 Unspecified atrial flutter: Secondary | ICD-10-CM | POA: Diagnosis present

## 2021-12-25 DIAGNOSIS — Z8616 Personal history of COVID-19: Secondary | ICD-10-CM

## 2021-12-25 DIAGNOSIS — K219 Gastro-esophageal reflux disease without esophagitis: Secondary | ICD-10-CM | POA: Diagnosis present

## 2021-12-25 DIAGNOSIS — Z8249 Family history of ischemic heart disease and other diseases of the circulatory system: Secondary | ICD-10-CM

## 2021-12-25 DIAGNOSIS — I252 Old myocardial infarction: Secondary | ICD-10-CM

## 2021-12-25 DIAGNOSIS — I739 Peripheral vascular disease, unspecified: Secondary | ICD-10-CM | POA: Diagnosis present

## 2021-12-25 DIAGNOSIS — R0602 Shortness of breath: Secondary | ICD-10-CM

## 2021-12-25 DIAGNOSIS — Z79899 Other long term (current) drug therapy: Secondary | ICD-10-CM

## 2021-12-25 DIAGNOSIS — Z886 Allergy status to analgesic agent status: Secondary | ICD-10-CM

## 2021-12-25 DIAGNOSIS — I5043 Acute on chronic combined systolic (congestive) and diastolic (congestive) heart failure: Secondary | ICD-10-CM | POA: Diagnosis present

## 2021-12-25 DIAGNOSIS — M109 Gout, unspecified: Secondary | ICD-10-CM | POA: Diagnosis present

## 2021-12-25 DIAGNOSIS — Z8673 Personal history of transient ischemic attack (TIA), and cerebral infarction without residual deficits: Secondary | ICD-10-CM

## 2021-12-25 DIAGNOSIS — I130 Hypertensive heart and chronic kidney disease with heart failure and stage 1 through stage 4 chronic kidney disease, or unspecified chronic kidney disease: Principal | ICD-10-CM | POA: Diagnosis present

## 2021-12-25 DIAGNOSIS — E8770 Fluid overload, unspecified: Secondary | ICD-10-CM

## 2021-12-25 DIAGNOSIS — I5032 Chronic diastolic (congestive) heart failure: Secondary | ICD-10-CM

## 2021-12-25 DIAGNOSIS — Z96643 Presence of artificial hip joint, bilateral: Secondary | ICD-10-CM | POA: Diagnosis present

## 2021-12-25 LAB — BASIC METABOLIC PANEL
Anion gap: 11 (ref 5–15)
BUN: 18 mg/dL (ref 8–23)
CO2: 24 mmol/L (ref 22–32)
Calcium: 8.7 mg/dL — ABNORMAL LOW (ref 8.9–10.3)
Chloride: 102 mmol/L (ref 98–111)
Creatinine, Ser: 1.52 mg/dL — ABNORMAL HIGH (ref 0.61–1.24)
GFR, Estimated: 46 mL/min — ABNORMAL LOW (ref >60)
Glucose, Bld: 95 mg/dL (ref 70–99)
Potassium: 4 mmol/L (ref 3.5–5.1)
Sodium: 137 mmol/L (ref 135–145)

## 2021-12-25 LAB — BRAIN NATRIURETIC PEPTIDE: B Natriuretic Peptide: 864.7 pg/mL — ABNORMAL HIGH (ref 0.0–100.0)

## 2021-12-25 LAB — CBC WITH DIFFERENTIAL/PLATELET
Abs Immature Granulocytes: 0.03 10*3/uL (ref 0.00–0.07)
Basophils Absolute: 0 10*3/uL (ref 0.0–0.1)
Basophils Relative: 0 %
Eosinophils Absolute: 0 10*3/uL (ref 0.0–0.5)
Eosinophils Relative: 0 %
HCT: 44.2 % (ref 39.0–52.0)
Hemoglobin: 14.2 g/dL (ref 13.0–17.0)
Immature Granulocytes: 0 %
Lymphocytes Relative: 15 %
Lymphs Abs: 1.2 10*3/uL (ref 0.7–4.0)
MCH: 32.3 pg (ref 26.0–34.0)
MCHC: 32.1 g/dL (ref 30.0–36.0)
MCV: 100.5 fL — ABNORMAL HIGH (ref 80.0–100.0)
Monocytes Absolute: 0.7 10*3/uL (ref 0.1–1.0)
Monocytes Relative: 9 %
Neutro Abs: 5.7 10*3/uL (ref 1.7–7.7)
Neutrophils Relative %: 76 %
Platelets: 274 10*3/uL (ref 150–400)
RBC: 4.4 MIL/uL (ref 4.22–5.81)
RDW: 14.3 % (ref 11.5–15.5)
WBC: 7.6 10*3/uL (ref 4.0–10.5)
nRBC: 0 % (ref 0.0–0.2)

## 2021-12-25 LAB — TROPONIN I (HIGH SENSITIVITY)
Troponin I (High Sensitivity): 41 ng/L — ABNORMAL HIGH (ref <18)
Troponin I (High Sensitivity): 41 ng/L — ABNORMAL HIGH (ref <18)

## 2021-12-25 MED ORDER — ALBUTEROL SULFATE HFA 108 (90 BASE) MCG/ACT IN AERS
2.0000 | INHALATION_SPRAY | RESPIRATORY_TRACT | Status: DC | PRN
  Filled 2021-12-25: qty 6.7

## 2021-12-25 NOTE — ED Triage Notes (Signed)
Pt arrived POV from the drs office. Pt was just seen 2 days ago for [facility] and CP. Pt states they should have never let me leave because it is worse.

## 2021-12-25 NOTE — Patient Outreach (Signed)
[facility]) Care Management  12/25/2021  Ryan [name] 10-Dec-1941 [id number]   Telephone Assessment  I spoke with pt's spouse Ryan [name]) who indicated they both were on there way to the cardiologist for a scheduled appointment for the pt and requested a call back.  RN will rescheduled for another outreach call at a later time or day.  Ryan Mina, RN Care Management Coordinator [place] Office [phone]

## 2021-12-25 NOTE — Progress Notes (Signed)
Patient referred by Ginger Organ., MD for chest pain, shortness of breath  Subjective:   Ryan [name], male    DOB: [birth year], 81 y.o.   MRN: [id number]   Chief Complaint  Patient presents with   Congestive Heart Failure   Shortness of Breath   New Patient (Initial Visit)     HPI  81 year old caucasian male with coronary artery disease status s/p CABG (patent LIMA-LAD, occluded SVG-ramus/OM Y graft), ischemic cardiomyopathy with recovered EF, s/p ICD, mod AS, persistent Afib, PAD s/o aortobifemoral bypass, mod carotid disease, h/o stroke, CD III  Patient was previously seen by [facility] heart care. Patient was last seen by [name], PA in 11/2021.  Patient had multiple ER visits with complaints of chest pain and shortness of breath in the past few weeks.  He reports retrosternal sharp chest pain radiating to his shoulder, that is worse with exertion and improved with rest.  Nitroglycerin partially helps relieve this pain.  In addition, he also has constant pain under left shoulder blade, that is present at all times.  He has severe shortness of breath with walking 10 to 15 feet.  Recent work-up in the ER showed elevated but flat troponin, elevated BNP.  He is on minimal medical management at this time, partially due to his ability to tolerate higher doses of Lasix due to lightheadedness.  He is currently on bisoprolol 2.5 mg daily, spironolactone 12.5 mg daily, Lasix 20 mg daily, along with amiodarone 200 mg daily since an episode of VT in 10/2021.  His last echocardiogram was in 07/2021, showed EF of 50-55%, which is higher than previous.  It also showed possibly paradoxically low-flow low gradient moderate aortic stenosis.  I personally reviewed and independently interpreted his previous cardiac testing, including echocardiogram and cardiac catheterization.   Past Medical History:  Diagnosis Date   Acquired thrombophilia ([place]) 11/01/2021   Arthritis    CAD (coronary artery  disease)    Carotid artery occlusion    Cataract    Bil eyes/worse in left eye   CHF (congestive heart failure) (HCC)    Chronic back pain    COVID-19 10/31/2021   DVT (deep venous thrombosis) (HCC)    Dysrhythmia    Enlarged prostate    takes Rapaflo daily   GERD (gastroesophageal reflux disease)    occasional   History of colon polyps    History of gout    has colchicine prn   History of kidney stones    Hyperlipidemia    takes Crestor daily   Hypertension    takes Amlodipine daily   Hypothyroidism    Myocardial infarction [facility])    Peripheral vascular disease (HCC)    Prolonged QT interval 11/01/2021   Pulmonary emboli ([place]) 03/20/2015   elevated d-dimer, intermediate V/Q study, atypical chest pain and SOB. Start on Xarelto 17m BID for 3 month   Rapid atrial fibrillation (HCC)    Renal insufficiency    Shortness of breath dyspnea    Urinary frequency    Urinary urgency      Past Surgical History:  Procedure Laterality Date   APPENDECTOMY     BACK SURGERY     5 times   big toe surgery     CARDIAC CATHETERIZATION     2010    dr nAcie Fredrickson  cataract surgery     left eye   CHOLECYSTECTOMY N/A 07/27/2016   Procedure: LAPAROSCOPIC CHOLECYSTECTOMY;  Surgeon: LMickeal Skinner MD;  Location: [place];  Service: General;  Laterality: N/A;   COLONOSCOPY     CORONARY ARTERY BYPASS GRAFT N/A 04/05/2015   Procedure: CORONARY ARTERY BYPASS GRAFTING (CABG)X4 LIMA-LAD; SVG-DIAG1-DIAG2; SVG-PD;  Surgeon: [name], MD;  Location: [place];  Service: Open Heart Surgery;  Laterality: N/A;   CORONARY/GRAFT ANGIOGRAPHY N/A 04/22/2018   Procedure: CORONARY/GRAFT ANGIOGRAPHY;  Surgeon: Martinique, Peter M, MD;  Location: [place] CV LAB;  Service: Cardiovascular;  Laterality: N/A;   CYSTOSCOPY     ENDARTERECTOMY Left 04/24/2016   Procedure: ENDARTERECTOMY LEFT CAROTID;  Surgeon: Mal Misty, MD;  Location: [place];  Service: Vascular;  Laterality: Left;   EYE SURGERY     FEMORAL  ARTERY - POPLITEAL ARTERY BYPASS GRAFT     ICD IMPLANT N/A 12/30/2020   Procedure: ICD IMPLANT;  Surgeon: Evans Lance, MD;  Location: [place] CV LAB;  Service: Cardiovascular;  Laterality: N/A;   JOINT REPLACEMENT     shoulder   LEFT HEART CATHETERIZATION WITH CORONARY ANGIOGRAM N/A 04/03/2015   Procedure: LEFT HEART CATHETERIZATION WITH CORONARY ANGIOGRAM;  Surgeon: Troy Sine, MD;  Location: [place] CATH LAB;  Service: Cardiovascular;  Laterality: N/A;   LUMBAR LAMINECTOMY  01/06/2013   Procedure: MICRODISCECTOMY LUMBAR LAMINECTOMY;  Surgeon: Marybelle Killings, MD;  Location: [place];  Service: Orthopedics;  Laterality: N/A;  L3-4 decompression   LUMBAR LAMINECTOMY/DECOMPRESSION MICRODISCECTOMY  02/12/2012   Procedure: LUMBAR LAMINECTOMY/DECOMPRESSION MICRODISCECTOMY;  Surgeon: Floyce Stakes, MD;  Location: [place] NEURO ORS;  Service: Neurosurgery;  Laterality: N/A;  Lumbar four-five laminectomy   PATCH ANGIOPLASTY Left 04/24/2016   Procedure: LEFT CAROTID ARTERY PATCH ANGIOPLASTY;  Surgeon: Mal Misty, MD;  Location: [place];  Service: Vascular;  Laterality: Left;   RIGHT HEART CATH AND CORONARY/GRAFT ANGIOGRAPHY N/A 01/30/2019   Procedure: RIGHT HEART CATH AND CORONARY/GRAFT ANGIOGRAPHY;  Surgeon: Troy Sine, MD;  Location: [place] CV LAB;  Service: Cardiovascular;  Laterality: N/A;   STERIOD INJECTION Right 01/09/2014   Procedure: STEROID INJECTION;  Surgeon: Mcarthur Rossetti, MD;  Location: [place];  Service: Orthopedics;  Laterality: Right;   TEE WITHOUT CARDIOVERSION N/A 04/05/2015   Procedure: TRANSESOPHAGEAL ECHOCARDIOGRAM (TEE);  Surgeon: [name], MD;  Location: [place];  Service: Open Heart Surgery;  Laterality: N/A;   TOTAL HIP ARTHROPLASTY Left 01/09/2014   DR Ninfa Linden   TOTAL HIP ARTHROPLASTY Left 01/09/2014   Procedure: LEFT TOTAL HIP ARTHROPLASTY ANTERIOR APPROACH and Steroid Injection Right hip;  Surgeon: Mcarthur Rossetti, MD;  Location: [place];  Service:  Orthopedics;  Laterality: Left;   TOTAL HIP ARTHROPLASTY Right 08/15/2019   TOTAL HIP ARTHROPLASTY Right 08/15/2019   Procedure: RIGHT TOTAL HIP ARTHROPLASTY ANTERIOR APPROACH;  Surgeon: Mcarthur Rossetti, MD;  Location: [place];  Service: Orthopedics;  Laterality: Right;     Social History   Tobacco Use  Smoking Status Former   Types: Cigarettes   Quit date: 02/04/1987   Years since quitting: 34.9  Smokeless Tobacco Former   Types: Chew   Quit date: 07/20/2009  Tobacco Comments   quit 35+yrs ago    Social History   Substance and Sexual Activity  Alcohol Use No   Alcohol/week: 0.0 standard drinks     Family History  Problem Relation Age of Onset   Heart disease Father    Heart attack Father    Heart disease Sister    Hypertension Sister    Heart attack Sister    Hypertension Mother  Diabetes Son      Current Outpatient Medications on File Prior to Visit  Medication Sig Dispense Refill   acetaminophen (TYLENOL) 325 MG tablet Take 2 tablets (650 mg total) by mouth every 6 (six) hours as needed for mild pain (or Fever >/= 101). 20 tablet 0   allopurinol (ZYLOPRIM) 100 MG tablet Take 100 mg by mouth daily as needed (gout).     ALPRAZolam (XANAX) 0.5 MG tablet Take 0.5 mg by mouth 3 (three) times daily as needed for anxiety or sleep.     amiodarone (PACERONE) 200 MG tablet Take 1 tablet (200 mg total) by mouth daily. 90 tablet 1   apixaban (ELIQUIS) 5 MG TABS tablet Take 1 tablet (5 mg total) by mouth 2 (two) times daily. 180 tablet 3   bisoprolol (ZEBETA) 5 MG tablet Take 0.5 tablets (2.5 mg total) by mouth daily. 45 tablet 3   finasteride (PROSCAR) 5 MG tablet Take 5 mg by mouth daily.     furosemide (LASIX) 20 MG tablet Take 1 tablet (20 mg total) by mouth every other day. 90 tablet 1   levothyroxine (SYNTHROID) 25 MCG tablet Take 25 mcg by mouth daily.     metoCLOPramide (REGLAN) 5 MG tablet Take 1 tablet (5 mg total) by mouth every 6 (six) hours as needed for  nausea. 15 tablet 0   nitroGLYCERIN (NITROSTAT) 0.4 MG SL tablet Place 1 tablet (0.4 mg total) under the tongue every 5 (five) minutes as needed for chest pain. 25 tablet 3   ondansetron (ZOFRAN-ODT) 4 MG disintegrating tablet Take 4 mg by mouth every 6 (six) hours as needed for nausea/vomiting.     pantoprazole (PROTONIX) 40 MG tablet Take 1 tablet (40 mg total) by mouth daily. 30 tablet 11   polyethylene glycol (MIRALAX / GLYCOLAX) 17 g packet Take 17 g by mouth 3 (three) times daily.     REPATHA SURECLICK 563 MG/ML SOAJ INJECT 1 PEN INTO THE SKIN EVERY 14 DAYS. 2 mL 11   senna-docusate (SENOKOT-S) 8.6-50 MG tablet Take 1 tablet by mouth at bedtime. 30 tablet 0   spironolactone (ALDACTONE) 25 MG tablet Take 0.5 tablets (12.5 mg total) by mouth daily. 45 tablet 3   No current facility-administered medications on file prior to visit.    Cardiovascular and other pertinent studies:  EKG 12/25/2021: Atypical atrial flutter 60 bpm RSR(V1) -nondiagnostic Nonspecific T-abnormality  Coronary and bypass graft angiography 01/2019: Severe native coronary artery disease with diffuse proximal to mid LAD stenosis with diffusely diseased native LAD proximally and mid with competitive filling from the LIMA graft; 90% proximal stenosis in the ramus immediate vessel, diffuse stenosis of 60 to 70% in the proximal circumflex with diffuse 80% distal stenosis and total occlusion of the AV groove after marginal vessel with total occlusion of the proximal native RCA.  There is extensive collateralization to the distal RCA both via the circumflex vessel as well as via the LIMA to LAD and distal LAD.   Patent LIMA graft supplying the mid LAD   Previously documented old occlusion of the previous Y graft which had supplied the ramus and marginal vessel in the vein graft which had supplied the PDA.   Low right heart pressures with mean PA pressure at 19 mmHg.   RECOMMENDATION: Increase medical therapy.  The patient  has only been on low-dose anti-ischemic medical regimen consisting of bisoprolol 2.5 mg as well as isosorbide 60 mg.  Recommend initiation of amlodipine 5 mg.  Also recommend consideration  of Ranexa 500 mg twice a day.  The patient is on rosuvastatin.  However if the patient is to be on atorvastatin, the maximum recommended dose of atorvastatin is 40 mg if the patient is on ranolazine.   Echocardiogram 08/28/2021: 1. Left ventricular ejection fraction, by estimation, is 50 to 55%. The left ventricle has low normal function. The left ventricle demonstrates regional wall motion abnormalities. Basal anterior/anteroseptal hypokinesis. There is severe asymmetric left ventricular hypertrophy of the basal-septal segment. Left ventricular diastolic parameters are indeterminate. 2. Right ventricular systolic function is moderately reduced. The right ventricular size is normal. There is normal pulmonary artery systolic pressure. 3. The mitral valve is normal in structure. Trivial mitral valve regurgitation. No evidence of mitral stenosis. 4. The inferior vena cava is normal in size with greater than 50% respiratory variability, suggesting right atrial pressure of 3 mmHg. 5. The aortic valve is calcified. There is moderate calcification of the aortic valve. Aortic valve regurgitation is not visualized. Moderate aortic valve stenosis. Mean gradient only 28mHg, but AVA 1.0 cm^2 and DI 0.35, suspect paradoxical low flow low gradient moderate AS given low SV index (21cc/m^2) 6. Small mobile echodensity adjacent to RV pacemaker lead (seen on image 14), could represent fibrinous strand or small adherent thrombus; if clinical concern for infection would check blood cultures   Recent labs: 12/23/2021: Glucose 106, BUN/Cr 17/1.48. EGFR 48. Na/K 138/4.1. Rest of the CMP normal H/H 14/46. MCV 100. Platelets 279  08/2021: HbA1C 6.0% Chol 99, TG 72, HDL 39, LDL 45 TSH 5.0 normal   Latest Reference Range & Units 12/15/21  01:01 12/15/21 03:14 12/23/21 13:04 12/23/21 14:57  B Natriuretic Peptide 0.0 - 100.0 pg/mL 1,878.1 (H)   1,115.7 (H)  Troponin I (High Sensitivity) <18 ng/L 57 (H) 51 (H) 45 (H) 42 (H)  (H): Data is abnormally high    Review of Systems  Cardiovascular:  Positive for chest pain and dyspnea on exertion. Negative for leg swelling, palpitations and syncope.  Respiratory:  Positive for shortness of breath.   Genitourinary:        Self catheterization at home due to urinary retention        Vitals:   12/25/21 1307  BP: 138/77  Pulse: (!) 59  Resp: 16  Temp: 98 F (36.7 C)  SpO2: 96%     Body mass index is 22.6 kg/m. Filed Weights   12/25/21 1307  Weight: 140 lb (63.5 kg)     Objective:   Physical Exam Vitals and nursing note reviewed.  Constitutional:      General: He is not in acute distress. Neck:     Vascular: JVD present.  Cardiovascular:     Rate and Rhythm: Normal rate. Rhythm irregular.     Pulses:          Dorsalis pedis pulses are 0 on the right side and 0 on the left side.       Posterior tibial pulses are 0 on the right side and 1+ on the left side.     Heart sounds: Murmur heard.  Harsh midsystolic murmur is present with a grade of 2/6 at the upper right sternal border radiating to the neck.  Pulmonary:     Effort: Pulmonary effort is normal.     Breath sounds: Normal breath sounds. No wheezing or rales.  Musculoskeletal:     Right lower leg: Edema (2+) present.     Left lower leg: Edema (2+) present.       Assessment &  Recommendations:   81 year old caucasian male with coronary artery disease status s/p CABG (patent LIMA-LAD, occluded SVG-ramus/OM Y graft), ischemic cardiomyopathy with recovered EF, s/p ICD, mod AS, persistent Afib, PAD s/o aortobifemoral bypass, mod carotid disease, h/o stroke, CD III  Chest pain, exertional dyspnea: Class III dyspnea symptoms with recurrent exertional chest pain.  He has had chronically elevated troponin and  BNP during recent ER visits.  I suspect he has worsening ischemic cardiomyopathy, not withstanding improved EF reported on echocardiogram in 07/2021.  I recommend hospitalization for IV diuresis, followed by consideration for ischemic work-up subject to his renal function. I do not think this can be adequately achieved on an outpatient basis. Currently, there are no beds available for direct hospital admit.  Recommend ER evaluation followed by hospital admission.  I will continue to see him on consultative basis. Recommend IV Lasix 40 mg twice daily with daily intake/output and daily weights. Also, add Imdur 30 mg daily for angina relief. Needs echocardiogram Continue bisoprolol, Repatha. In addition to ongoing use of Eliquis for atrial fibrillation, recommend adding aspirin 81 mg daily given his extensive CAD and suspected worsening ischemic cardiomyopathy. Continue low-dose spironolactone given baseline HFrEF with recovered EF  Persistent A. fib/atrial flutter: Longstanding.  Rate controlled. High CHA2DS2VAsc score Continue Eliquis for now.  If any possibility for cardiac catheterization this hospitalization, will switch him to heparin.  H/o NSVT: Continue amiodarone 200 mg daily.  Urinary retention: Chronic complaint.  Reportedly, patient performs self-catheterization at home.  He requests indwelling Foley catheter while being hospitalized.  Given his need for strict urine output monitoring, I think this is reasonable.  PAD: S/p aortobifem bypass.  No critical ischemia.  Thank you for referring the patient to [place]. Please feel free to contact with any questions.   Nigel Mormon, MD Pager: [phone] Office: [phone]

## 2021-12-25 NOTE — ED Provider Triage Note (Signed)
Emergency Medicine Provider Triage Evaluation Note  Ryan [name] , a 81 y.o. male  was evaluated in triage.  Pt complains of worsening shortness of breath for the past 4 days.  Patient states that he was sent here by Dr. Virgina Jock, [facility] cardiology for fluid overload.  Patient states that he normally takes 40mg  Lasix a day.  States that he was having increasing fluid and took 60 mg of Lasix around 0730 this morning.  He states that he continues to feel short of breath and having intermittent chest pain.  He does complain of lower extremity edema.  Denies lightheadedness or dizziness.  Review of Systems  Positive: See above Negative:   Physical Exam  BP 120/76 (BP Location: Right Arm)    Pulse (!) 51    Temp 98.5 F (36.9 C) (Oral)    Resp 16    Ht 5\' 6"  (1.676 m)    Wt 65.3 kg    SpO2 100%    BMI 23.24 kg/m  Gen:   Awake, no distress   Resp:  Normal effort, crackles in bilateral bases MSK:   Moves extremities without difficulty  Other:  S1 and S2 without murmur, bilateral lower extremities with 1+ pitting edema  Medical Decision Making  Medically screening exam initiated at 3:09 PM.  Appropriate orders placed.  Ryan [name] was informed that the remainder of the evaluation will be completed by another provider, this initial triage assessment does not replace that evaluation, and the importance of remaining in the ED until their evaluation is complete.     Ryan Hillier, PA-C 12/25/21 1510

## 2021-12-26 ENCOUNTER — Other Ambulatory Visit: Payer: Self-pay | Admitting: *Deleted

## 2021-12-26 ENCOUNTER — Inpatient Hospital Stay (HOSPITAL_COMMUNITY): Payer: Medicare Other

## 2021-12-26 DIAGNOSIS — I25118 Atherosclerotic heart disease of native coronary artery with other forms of angina pectoris: Secondary | ICD-10-CM | POA: Diagnosis not present

## 2021-12-26 DIAGNOSIS — R0602 Shortness of breath: Secondary | ICD-10-CM | POA: Diagnosis present

## 2021-12-26 DIAGNOSIS — Z8249 Family history of ischemic heart disease and other diseases of the circulatory system: Secondary | ICD-10-CM | POA: Diagnosis not present

## 2021-12-26 DIAGNOSIS — E8770 Fluid overload, unspecified: Secondary | ICD-10-CM | POA: Diagnosis not present

## 2021-12-26 DIAGNOSIS — R0789 Other chest pain: Secondary | ICD-10-CM | POA: Diagnosis not present

## 2021-12-26 DIAGNOSIS — I350 Nonrheumatic aortic (valve) stenosis: Secondary | ICD-10-CM | POA: Diagnosis present

## 2021-12-26 DIAGNOSIS — M1990 Unspecified osteoarthritis, unspecified site: Secondary | ICD-10-CM | POA: Diagnosis present

## 2021-12-26 DIAGNOSIS — N401 Enlarged prostate with lower urinary tract symptoms: Secondary | ICD-10-CM | POA: Diagnosis present

## 2021-12-26 DIAGNOSIS — E785 Hyperlipidemia, unspecified: Secondary | ICD-10-CM | POA: Diagnosis present

## 2021-12-26 DIAGNOSIS — I25810 Atherosclerosis of coronary artery bypass graft(s) without angina pectoris: Secondary | ICD-10-CM | POA: Diagnosis present

## 2021-12-26 DIAGNOSIS — I5031 Acute diastolic (congestive) heart failure: Secondary | ICD-10-CM | POA: Diagnosis present

## 2021-12-26 DIAGNOSIS — I739 Peripheral vascular disease, unspecified: Secondary | ICD-10-CM | POA: Diagnosis present

## 2021-12-26 DIAGNOSIS — I4892 Unspecified atrial flutter: Secondary | ICD-10-CM | POA: Diagnosis present

## 2021-12-26 DIAGNOSIS — I25119 Atherosclerotic heart disease of native coronary artery with unspecified angina pectoris: Secondary | ICD-10-CM | POA: Diagnosis present

## 2021-12-26 DIAGNOSIS — I130 Hypertensive heart and chronic kidney disease with heart failure and stage 1 through stage 4 chronic kidney disease, or unspecified chronic kidney disease: Secondary | ICD-10-CM | POA: Diagnosis present

## 2021-12-26 DIAGNOSIS — I480 Paroxysmal atrial fibrillation: Secondary | ICD-10-CM | POA: Diagnosis not present

## 2021-12-26 DIAGNOSIS — K219 Gastro-esophageal reflux disease without esophagitis: Secondary | ICD-10-CM | POA: Diagnosis present

## 2021-12-26 DIAGNOSIS — M549 Dorsalgia, unspecified: Secondary | ICD-10-CM | POA: Diagnosis present

## 2021-12-26 DIAGNOSIS — R338 Other retention of urine: Secondary | ICD-10-CM | POA: Diagnosis present

## 2021-12-26 DIAGNOSIS — E039 Hypothyroidism, unspecified: Secondary | ICD-10-CM | POA: Diagnosis present

## 2021-12-26 DIAGNOSIS — N1831 Chronic kidney disease, stage 3a: Secondary | ICD-10-CM | POA: Diagnosis present

## 2021-12-26 DIAGNOSIS — I5043 Acute on chronic combined systolic (congestive) and diastolic (congestive) heart failure: Secondary | ICD-10-CM | POA: Diagnosis present

## 2021-12-26 DIAGNOSIS — I252 Old myocardial infarction: Secondary | ICD-10-CM | POA: Diagnosis not present

## 2021-12-26 DIAGNOSIS — Z86718 Personal history of other venous thrombosis and embolism: Secondary | ICD-10-CM | POA: Diagnosis not present

## 2021-12-26 DIAGNOSIS — G8929 Other chronic pain: Secondary | ICD-10-CM | POA: Diagnosis present

## 2021-12-26 DIAGNOSIS — Z8616 Personal history of COVID-19: Secondary | ICD-10-CM | POA: Diagnosis not present

## 2021-12-26 DIAGNOSIS — I4819 Other persistent atrial fibrillation: Secondary | ICD-10-CM | POA: Diagnosis present

## 2021-12-26 DIAGNOSIS — Z20822 Contact with and (suspected) exposure to covid-19: Secondary | ICD-10-CM | POA: Diagnosis present

## 2021-12-26 DIAGNOSIS — D6859 Other primary thrombophilia: Secondary | ICD-10-CM | POA: Diagnosis present

## 2021-12-26 DIAGNOSIS — M109 Gout, unspecified: Secondary | ICD-10-CM | POA: Diagnosis present

## 2021-12-26 LAB — RESP PANEL BY RT-PCR (FLU A&B, COVID) ARPGX2
Influenza A by PCR: NEGATIVE
Influenza B by PCR: NEGATIVE
SARS Coronavirus 2 by RT PCR: NEGATIVE

## 2021-12-26 LAB — CBC
HCT: 47.4 % (ref 39.0–52.0)
Hemoglobin: 15.7 g/dL (ref 13.0–17.0)
MCH: 32.5 pg (ref 26.0–34.0)
MCHC: 33.1 g/dL (ref 30.0–36.0)
MCV: 98.1 fL (ref 80.0–100.0)
Platelets: 331 10*3/uL (ref 150–400)
RBC: 4.83 MIL/uL (ref 4.22–5.81)
RDW: 14.2 % (ref 11.5–15.5)
WBC: 10.1 10*3/uL (ref 4.0–10.5)
nRBC: 0 % (ref 0.0–0.2)

## 2021-12-26 LAB — CREATININE, SERUM
Creatinine, Ser: 1.3 mg/dL — ABNORMAL HIGH (ref 0.61–1.24)
GFR, Estimated: 56 mL/min — ABNORMAL LOW (ref >60)

## 2021-12-26 LAB — TROPONIN I (HIGH SENSITIVITY)
Troponin I (High Sensitivity): 41 ng/L — ABNORMAL HIGH (ref <18)
Troponin I (High Sensitivity): 62 ng/L — ABNORMAL HIGH (ref <18)

## 2021-12-26 MED ORDER — AMIODARONE HCL 200 MG PO TABS
200.0000 mg | ORAL_TABLET | Freq: Every day | ORAL | Status: DC
  Administered 2021-12-27 – 2021-12-28 (×2): 200 mg via ORAL
  Filled 2021-12-26 (×3): qty 1

## 2021-12-26 MED ORDER — ACETAMINOPHEN 325 MG PO TABS: 650.0000 mg | ORAL_TABLET | ORAL | Status: DC | PRN

## 2021-12-26 MED ORDER — SODIUM CHLORIDE 0.9% FLUSH: 3.0000 mL | INTRAVENOUS | Status: DC | PRN

## 2021-12-26 MED ORDER — ALLOPURINOL 100 MG PO TABS: 100.0000 mg | ORAL_TABLET | Freq: Every day | ORAL | Status: DC | PRN

## 2021-12-26 MED ORDER — SENNOSIDES-DOCUSATE SODIUM 8.6-50 MG PO TABS
1.0000 | ORAL_TABLET | Freq: Every day | ORAL | Status: DC
  Administered 2021-12-26 – 2021-12-27 (×2): 1 via ORAL
  Filled 2021-12-26 (×2): qty 1

## 2021-12-26 MED ORDER — FINASTERIDE 5 MG PO TABS
5.0000 mg | ORAL_TABLET | Freq: Every day | ORAL | Status: DC
  Administered 2021-12-26 – 2021-12-28 (×3): 5 mg via ORAL
  Filled 2021-12-26 (×3): qty 1

## 2021-12-26 MED ORDER — ISOSORBIDE MONONITRATE ER 30 MG PO TB24
30.0000 mg | ORAL_TABLET | Freq: Every day | ORAL | Status: DC
  Administered 2021-12-26 – 2021-12-28 (×3): 30 mg via ORAL
  Filled 2021-12-26 (×3): qty 1

## 2021-12-26 MED ORDER — SODIUM CHLORIDE 0.9 % IV SOLN: 250.0000 mL | INTRAVENOUS | Status: DC | PRN

## 2021-12-26 MED ORDER — HEPARIN SODIUM (PORCINE) 5000 UNIT/ML IJ SOLN: 5000.0000 [IU] | Freq: Three times a day (TID) | INTRAMUSCULAR | Status: DC

## 2021-12-26 MED ORDER — POLYETHYLENE GLYCOL 3350 17 G PO PACK
17.0000 g | PACK | Freq: Three times a day (TID) | ORAL | Status: DC
  Administered 2021-12-26 (×2): 17 g via ORAL
  Filled 2021-12-26 (×4): qty 1

## 2021-12-26 MED ORDER — ALBUTEROL SULFATE (2.5 MG/3ML) 0.083% IN NEBU: 2.5000 mg | INHALATION_SOLUTION | RESPIRATORY_TRACT | Status: DC | PRN

## 2021-12-26 MED ORDER — NITROGLYCERIN 0.4 MG SL SUBL: 0.4000 mg | SUBLINGUAL_TABLET | SUBLINGUAL | Status: DC | PRN

## 2021-12-26 MED ORDER — ACETAMINOPHEN 325 MG PO TABS: 650.0000 mg | ORAL_TABLET | Freq: Four times a day (QID) | ORAL | Status: DC | PRN

## 2021-12-26 MED ORDER — ONDANSETRON 4 MG PO TBDP
4.0000 mg | ORAL_TABLET | Freq: Four times a day (QID) | ORAL | Status: DC | PRN
  Administered 2021-12-27 – 2021-12-28 (×2): 4 mg via ORAL
  Filled 2021-12-26 (×3): qty 1

## 2021-12-26 MED ORDER — LEVOTHYROXINE SODIUM 25 MCG PO TABS
25.0000 ug | ORAL_TABLET | Freq: Every day | ORAL | Status: DC
  Administered 2021-12-27 – 2021-12-28 (×2): 25 ug via ORAL
  Filled 2021-12-26 (×2): qty 1

## 2021-12-26 MED ORDER — SODIUM CHLORIDE 0.9% FLUSH
3.0000 mL | Freq: Two times a day (BID) | INTRAVENOUS | Status: DC
  Administered 2021-12-26 – 2021-12-28 (×5): 3 mL via INTRAVENOUS

## 2021-12-26 MED ORDER — APIXABAN 5 MG PO TABS
5.0000 mg | ORAL_TABLET | Freq: Two times a day (BID) | ORAL | Status: DC
  Administered 2021-12-26 – 2021-12-28 (×4): 5 mg via ORAL
  Filled 2021-12-26 (×4): qty 1

## 2021-12-26 MED ORDER — PANTOPRAZOLE SODIUM 40 MG PO TBEC
40.0000 mg | DELAYED_RELEASE_TABLET | Freq: Every day | ORAL | Status: DC
  Administered 2021-12-26 – 2021-12-28 (×3): 40 mg via ORAL
  Filled 2021-12-26 (×3): qty 1

## 2021-12-26 MED ORDER — FUROSEMIDE 10 MG/ML IJ SOLN
40.0000 mg | Freq: Two times a day (BID) | INTRAMUSCULAR | Status: DC
  Administered 2021-12-26 (×2): 40 mg via INTRAVENOUS
  Filled 2021-12-26 (×3): qty 4

## 2021-12-26 MED ORDER — BISOPROLOL FUMARATE 5 MG PO TABS
2.5000 mg | ORAL_TABLET | Freq: Every day | ORAL | Status: DC
  Administered 2021-12-27 – 2021-12-28 (×2): 2.5 mg via ORAL
  Filled 2021-12-26: qty 0.5
  Filled 2021-12-26 (×2): qty 1

## 2021-12-26 NOTE — H&P (Signed)
Ryan [name] is an 81 y.o. male.   Chief Complaint: Chest pain HPI:   81 year old caucasian male with coronary artery disease status s/p CABG (patent LIMA-LAD, occluded SVG-ramus/OM Y graft), ischemic cardiomyopathy with recovered EF, s/p ICD, mod AS, persistent Afib, PAD s/o aortobifemoral bypass, mod carotid disease, h/o stroke, CKD III  I saw the patient in the office on 12/25/2021.  Given his multiple ER presentations for chest pain and shortness of breath, and his symptoms of acute on chronic heart failure, I recommended ED evaluation.  Patient waited in the ER waiting room overnight, and was finally brought to the ER.  He is still has had recurrent chest pain episodes, but fortunately with flat troponin.  Past Medical History:  Diagnosis Date   Acquired thrombophilia ([place]) 11/01/2021   Arthritis    CAD (coronary artery disease)    Carotid artery occlusion    Cataract    Bil eyes/worse in left eye   CHF (congestive heart failure) (HCC)    Chronic back pain    COVID-19 10/31/2021   DVT (deep venous thrombosis) (HCC)    Dysrhythmia    Enlarged prostate    takes Rapaflo daily   GERD (gastroesophageal reflux disease)    occasional   History of colon polyps    History of gout    has colchicine prn   History of kidney stones    Hyperlipidemia    takes Crestor daily   Hypertension    takes Amlodipine daily   Hypothyroidism    Myocardial infarction [facility])    Peripheral vascular disease (HCC)    Prolonged QT interval 11/01/2021   Pulmonary emboli ([place]) 03/20/2015   elevated d-dimer, intermediate V/Q study, atypical chest pain and SOB. Start on Xarelto 20mg  BID for 3 month   Rapid atrial fibrillation ([place])    Renal insufficiency    Shortness of breath dyspnea    Urinary frequency    Urinary urgency     Past Surgical History:  Procedure Laterality Date   APPENDECTOMY     BACK SURGERY     5 times   big toe surgery     CARDIAC CATHETERIZATION     2010    dr Acie Fredrickson    cataract surgery     left eye   CHOLECYSTECTOMY N/A 07/27/2016   Procedure: LAPAROSCOPIC CHOLECYSTECTOMY;  Surgeon: Mickeal Skinner, MD;  Location: [place];  Service: General;  Laterality: N/A;   COLONOSCOPY     CORONARY ARTERY BYPASS GRAFT N/A 04/05/2015   Procedure: CORONARY ARTERY BYPASS GRAFTING (CABG)X4 LIMA-LAD; SVG-DIAG1-DIAG2; SVG-PD;  Surgeon: [name], MD;  Location: [place];  Service: Open Heart Surgery;  Laterality: N/A;   CORONARY/GRAFT ANGIOGRAPHY N/A 04/22/2018   Procedure: CORONARY/GRAFT ANGIOGRAPHY;  Surgeon: Martinique, Peter M, MD;  Location: [place] [facility];  Service: Cardiovascular;  Laterality: N/A;   CYSTOSCOPY     ENDARTERECTOMY Left 04/24/2016   Procedure: ENDARTERECTOMY LEFT CAROTID;  Surgeon: Mal Misty, MD;  Location: [place];  Service: Vascular;  Laterality: Left;   EYE SURGERY     FEMORAL ARTERY - POPLITEAL ARTERY BYPASS GRAFT     ICD IMPLANT N/A 12/30/2020   Procedure: ICD IMPLANT;  Surgeon: Evans Lance, MD;  Location: [place] [facility];  Service: Cardiovascular;  Laterality: N/A;   JOINT REPLACEMENT     shoulder   LEFT HEART CATHETERIZATION WITH CORONARY ANGIOGRAM N/A 04/03/2015   Procedure: LEFT HEART CATHETERIZATION WITH CORONARY ANGIOGRAM;  Surgeon: Joyice Faster  Claiborne Billings, MD;  Location: [facility] C[facility];  Service: Cardiovascular;  Laterality: N/A;   LUMBAR LAMINECTOMY  01/06/2013   Procedure: MICRODISCECTOMY LUMBAR LAMINECTOMY;  Surgeon: Marybelle Killings, MD;  Location: [place];  Service: Orthopedics;  Laterality: N/A;  L3-4 decompression   LUMBAR LAMINECTOMY/DECOMPRESSION MICRODISCECTOMY  02/12/2012   Procedure: LUMBAR LAMINECTOMY/DECOMPRESSION MICRODISCECTOMY;  Surgeon: Floyce Stakes, MD;  Location: [place] [facility];  Service: Neurosurgery;  Laterality: N/A;  Lumbar four-five laminectomy   PATCH ANGIOPLASTY Left 04/24/2016   Procedure: LEFT CAROTID ARTERY PATCH ANGIOPLASTY;  Surgeon: Mal Misty, MD;  Location: [place];  Service: Vascular;  Laterality: Left;   RIGHT  HEART CATH AND CORONARY/GRAFT ANGIOGRAPHY N/A 01/30/2019   Procedure: RIGHT HEART CATH AND CORONARY/GRAFT ANGIOGRAPHY;  Surgeon: Troy Sine, MD;  Location: [place] [facility];  Service: Cardiovascular;  Laterality: N/A;   STERIOD INJECTION Right 01/09/2014   Procedure: STEROID INJECTION;  Surgeon: Mcarthur Rossetti, MD;  Location: [place];  Service: Orthopedics;  Laterality: Right;   TEE WITHOUT CARDIOVERSION N/A 04/05/2015   Procedure: TRANSESOPHAGEAL ECHOCARDIOGRAM (TEE);  Surgeon: [name], MD;  Location: [place];  Service: Open Heart Surgery;  Laterality: N/A;   TOTAL HIP ARTHROPLASTY Left 01/09/2014   DR Ninfa Linden   TOTAL HIP ARTHROPLASTY Left 01/09/2014   Procedure: LEFT TOTAL HIP ARTHROPLASTY ANTERIOR APPROACH and Steroid Injection Right hip;  Surgeon: Mcarthur Rossetti, MD;  Location: [place];  Service: Orthopedics;  Laterality: Left;   TOTAL HIP ARTHROPLASTY Right 08/15/2019   TOTAL HIP ARTHROPLASTY Right 08/15/2019   Procedure: RIGHT TOTAL HIP ARTHROPLASTY ANTERIOR APPROACH;  Surgeon: Mcarthur Rossetti, MD;  Location: [place];  Service: Orthopedics;  Laterality: Right;     Family History  Problem Relation Age of Onset   Heart disease Father    Heart attack Father    Heart disease Sister    Hypertension Sister    Heart attack Sister    Hypertension Mother    Diabetes Son     Social History:  reports that he quit smoking about 34 years ago. His smoking use included cigarettes. He has a 25.00 pack-year smoking history. He quit smokeless tobacco use about 12 years ago.  His smokeless tobacco use included chew. He reports that he does not drink alcohol and does not use drugs.  Allergies:  Allergies  Allergen Reactions   Jardiance [Empagliflozin] Nausea Only and Other (See Comments)    Made patient very sick to the stomach.   Ezetimibe Other (See Comments)    Myalgias  Other reaction(s): myalgias   Ace Inhibitors Other (See Comments)    Other reaction(s):  cough   Aspirin Other (See Comments)    Other reaction(s): skin rash/easy bruising   Atorvastatin Other (See Comments)    Other reaction(s): weakness, fatigue (severe)   Ezetimibe-Simvastatin Other (See Comments)    Other reaction(s): myalgias   Codeine Nausea And Vomiting      Other reaction(s): nausea   Unithroid [Levothyroxine Sodium] Other (See Comments)    Caused blurry vision per pt    Review of Systems  Constitutional: Negative for decreased appetite, malaise/fatigue, weight gain and weight loss.  HENT:  Negative for congestion.   Eyes:  Negative for visual disturbance.  Cardiovascular:  Positive for chest pain, dyspnea on exertion and leg swelling. Negative for palpitations and syncope.  Respiratory:  Positive for shortness of breath. Negative for cough.   Endocrine: Negative for cold intolerance.  Hematologic/Lymphatic: Does not bruise/bleed easily.  Skin:  Negative for itching and rash.  Musculoskeletal:  Positive for back pain. Negative for myalgias.  Gastrointestinal:  Negative for abdominal pain, nausea and vomiting.  Genitourinary:  Negative for dysuria.  Neurological:  Negative for dizziness and weakness.  Psychiatric/Behavioral:  The patient is not nervous/anxious.   All other systems reviewed and are negative.   Blood pressure (!) 169/91, pulse 68, temperature 98.5 F (36.9 C), temperature source Oral, resp. rate 14, height 5\' 6"  (1.676 m), weight 65.3 kg, SpO2 98 %. Body mass index is 23.24 kg/m.  Physical Exam Vitals and nursing note reviewed.  Constitutional:      General: He is not in acute distress.    Appearance: He is well-developed.  HENT:     Head: Normocephalic and atraumatic.  Eyes:     Conjunctiva/sclera: Conjunctivae normal.     Pupils: Pupils are equal, round, and reactive to light.  Neck:     Vascular: No JVD.  Cardiovascular:     Rate and Rhythm: Normal rate and regular rhythm.     Pulses: Normal pulses and intact distal pulses.      Heart sounds: No murmur heard. Pulmonary:     Effort: Pulmonary effort is normal.     Breath sounds: Normal breath sounds. No wheezing or rales.  Chest:     Comments: ICD in place left upper pectoral region Abdominal:     General: Bowel sounds are normal.     Palpations: Abdomen is soft.     Tenderness: There is no rebound.  Musculoskeletal:        General: No tenderness. Normal range of motion.     Right lower leg: Edema (2+) present.     Left lower leg: Edema (2+) present.  Lymphadenopathy:     Cervical: No cervical adenopathy.  Skin:    General: Skin is warm and dry.  Neurological:     Mental Status: He is alert and oriented to person, place, and time.     Cranial Nerves: No cranial nerve deficit.    Results for orders placed or performed during the hospital encounter of 12/25/21 (from the past 48 hour(s))  Basic metabolic panel     Status: Abnormal   Collection Time: 12/25/21  3:11 PM  Result Value Ref Range   Sodium 137 135 - 145 mmol/L   Potassium 4.0 3.5 - 5.1 mmol/L   Chloride 102 98 - 111 mmol/L   CO2 24 22 - 32 mmol/L   Glucose, Bld 95 70 - 99 mg/dL    Comment: Glucose reference range applies only to samples taken after fasting for at least 8 hours.   BUN 18 8 - 23 mg/dL   Creatinine, Ser 1.52 (H) 0.61 - 1.24 mg/dL   Calcium 8.7 (L) 8.9 - 10.3 mg/dL   GFR, Estimated 46 (L) >60 mL/min    Comment: (NOTE) Calculated using the CKD-EPI Creatinine Equation (2021)    Anion gap 11 5 - 15    Comment: Performed at [facility] [address], [place], [place] [address]  Brain natriuretic peptide     Status: Abnormal   Collection Time: 12/25/21  3:11 PM  Result Value Ref Range   B Natriuretic Peptide 864.7 (H) 0.0 - 100.0 pg/mL    Comment: Performed at [facility] [address] [place], [place] [address]  Troponin I (High Sensitivity)     Status: Abnormal   Collection Time: 12/25/21  3:11 PM  Result Value Ref Range   Troponin  I (High Sensitivity) 41  (H) <18 ng/L    Comment: (NOTE) Elevated high sensitivity troponin I (hsTnI) values and significant  changes across serial measurements may suggest ACS but many other  chronic and acute conditions are known to elevate hsTnI results.  Refer to the "Links" section for chest pain algorithms and additional  guidance. Performed at [facility], [place] [address] [place], [place] [address]   CBC with Differential     Status: Abnormal   Collection Time: 12/25/21  3:11 PM  Result Value Ref Range   WBC 7.6 4.0 - 10.5 K/uL   RBC 4.40 4.22 - 5.81 MIL/uL   Hemoglobin 14.2 13.0 - 17.0 g/dL   HCT 44.2 39.0 - 52.0 %   MCV 100.5 (H) 80.0 - 100.0 fL   MCH 32.3 26.0 - 34.0 pg   MCHC 32.1 30.0 - 36.0 g/dL   RDW 14.3 11.5 - 15.5 %   Platelets 274 150 - 400 K/uL   nRBC 0.0 0.0 - 0.2 %   Neutrophils Relative % 76 %   Neutro Abs 5.7 1.7 - 7.7 K/uL   Lymphocytes Relative 15 %   Lymphs Abs 1.2 0.7 - 4.0 K/uL   Monocytes Relative 9 %   Monocytes Absolute 0.7 0.1 - 1.0 K/uL   Eosinophils Relative 0 %   Eosinophils Absolute 0.0 0.0 - 0.5 K/uL   Basophils Relative 0 %   Basophils Absolute 0.0 0.0 - 0.1 K/uL   Immature Granulocytes 0 %   Abs Immature Granulocytes 0.03 0.00 - 0.07 K/uL    Comment: Performed at [facility] [address], [place], [place] [address]  Troponin I (High Sensitivity)     Status: Abnormal   Collection Time: 12/25/21  8:21 PM  Result Value Ref Range   Troponin I (High Sensitivity) 41 (H) <18 ng/L    Comment: (NOTE) Elevated high sensitivity troponin I (hsTnI) values and significant  changes across serial measurements may suggest ACS but many other  chronic and acute conditions are known to elevate hsTnI results.  Refer to the "Links" section for chest pain algorithms and additional  guidance. Performed at [facility], [place] [address], [place], [place] [address]     Labs:   Lab Results  Component Value Date   WBC 7.6 12/25/2021   HGB 14.2 12/25/2021    HCT 44.2 12/25/2021   MCV 100.5 (H) 12/25/2021   PLT 274 12/25/2021    Recent Labs  Lab 12/25/21 1511  NA 137  K 4.0  CL 102  CO2 24  BUN 18  CREATININE 1.52*  CALCIUM 8.7*  GLUCOSE 95    Lipid Panel     Component Value Date/Time   CHOL 99 (L) 07/29/2020 1213   TRIG 72 07/29/2020 1213   HDL 39 (L) 07/29/2020 1213   CHOLHDL 2.5 07/29/2020 1213   CHOLHDL 3.8 02/08/2018 1003   VLDL 22 02/08/2018 1003   LDLCALC 45 07/29/2020 1213    BNP (last 3 results) Recent Labs    12/15/21 0101 12/23/21 1457 12/25/21 1511  BNP 1,878.1* 1,115.7* 864.7*    HEMOGLOBIN A1C Lab Results  Component Value Date   HGBA1C 6.0 (H) 09/11/2021   MPG 126 09/11/2021    Cardiac Panel (last 3 results)  Latest Reference Range & Units 12/25/21 15:11 12/25/21 20:21 12/26/21 12:03  B Natriuretic Peptide 0.0 - 100.0 pg/mL 864.7 (H)    Troponin I (High Sensitivity) <18 ng/L 41 (H) 41 (H) 41 (  H)  (H): Data is abnormally high  Lab Results  Component Value Date   CKTOTAL 301 (H) 03/06/2009   CKMB 3.1 03/06/2009     TSH Recent Labs    03/22/21 1459 08/27/21 1934 09/11/21 0341  TSH 7.434* 11.949* 5.069*       Current Facility-Administered Medications:    0.9 %  sodium chloride infusion, 250 mL, Intravenous, PRN, Abdulkadir Emmanuel J, MD   acetaminophen (TYLENOL) tablet 650 mg, 650 mg, Oral, Q4H PRN, Joshalyn Ancheta J, MD   albuterol (VENTOLIN HFA) 108 (90 Base) MCG/ACT inhaler 2 puff, 2 puff, Inhalation, Q2H PRN, Tegeler, Gwenyth Allegra, MD   allopurinol (ZYLOPRIM) tablet 100 mg, 100 mg, Oral, Daily PRN, Kharma Sampsel J, MD   amiodarone (PACERONE) tablet 200 mg, 200 mg, Oral, Daily, Dequon Schnebly J, MD   apixaban (ELIQUIS) tablet 5 mg, 5 mg, Oral, BID, Hyatt Capobianco J, MD   bisoprolol (ZEBETA) tablet 2.5 mg, 2.5 mg, Oral, Daily, Davie Sagona J, MD   finasteride (PROSCAR) tablet 5 mg, 5 mg, Oral, Daily, Zettie Gootee J, MD   furosemide (LASIX) injection  40 mg, 40 mg, Intravenous, BID, Braeden Dolinski J, MD   isosorbide mononitrate (IMDUR) 24 hr tablet 30 mg, 30 mg, Oral, Daily, Laterica Matarazzo J, MD   levothyroxine (SYNTHROID) tablet 25 mcg, 25 mcg, Oral, Daily, Dodi Leu J, MD   nitroGLYCERIN (NITROSTAT) SL tablet 0.4 mg, 0.4 mg, Sublingual, Q5 min PRN, Cid Agena J, MD   ondansetron (ZOFRAN-ODT) disintegrating tablet 4 mg, 4 mg, Oral, Q6H PRN, Christia Coaxum J, MD   pantoprazole (PROTONIX) EC tablet 40 mg, 40 mg, Oral, Daily, Buelah Rennie J, MD   polyethylene glycol (MIRALAX / GLYCOLAX) packet 17 g, 17 g, Oral, TID, Chandra Feger J, MD   senna-docusate (Senokot-S) tablet 1 tablet, 1 tablet, Oral, QHS, Amrutha Avera J, MD   sodium chloride flush (NS) 0.9 % injection 3 mL, 3 mL, Intravenous, Q12H, Hannan Hutmacher J, MD   sodium chloride flush (NS) 0.9 % injection 3 mL, 3 mL, Intravenous, PRN, Reylene Stauder J, MD  Current Outpatient Medications:    amiodarone (PACERONE) 200 MG tablet, Take 1 tablet (200 mg total) by mouth daily., Disp: 90 tablet, Rfl: 1   apixaban (ELIQUIS) 5 MG TABS tablet, Take 1 tablet (5 mg total) by mouth 2 (two) times daily., Disp: 180 tablet, Rfl: 3   bisoprolol (ZEBETA) 5 MG tablet, Take 0.5 tablets (2.5 mg total) by mouth daily., Disp: 45 tablet, Rfl: 3   acetaminophen (TYLENOL) 325 MG tablet, Take 2 tablets (650 mg total) by mouth every 6 (six) hours as needed for mild pain (or Fever >/= 101)., Disp: 20 tablet, Rfl: 0   allopurinol (ZYLOPRIM) 100 MG tablet, Take 100 mg by mouth daily as needed (gout)., Disp: , Rfl:    ALPRAZolam (XANAX) 0.5 MG tablet, Take 0.5 mg by mouth 3 (three) times daily as needed for anxiety or sleep., Disp: , Rfl:    finasteride (PROSCAR) 5 MG tablet, Take 5 mg by mouth daily., Disp: , Rfl:    furosemide (LASIX) 20 MG tablet, Take 1 tablet (20 mg total) by mouth every other day., Disp: 90 tablet, Rfl: 1   levothyroxine (SYNTHROID) 25 MCG tablet,  Take 25 mcg by mouth daily., Disp: , Rfl:    metoCLOPramide (REGLAN) 5 MG tablet, Take 1 tablet (5 mg total) by mouth every 6 (six) hours as needed for nausea., Disp: 15 tablet, Rfl: 0   nitroGLYCERIN (NITROSTAT) 0.4 MG SL  tablet, Place 1 tablet (0.4 mg total) under the tongue every 5 (five) minutes as needed for chest pain., Disp: 25 tablet, Rfl: 3   ondansetron (ZOFRAN-ODT) 4 MG disintegrating tablet, Take 4 mg by mouth every 6 (six) hours as needed for nausea/vomiting., Disp: , Rfl:    pantoprazole (PROTONIX) 40 MG tablet, Take 1 tablet (40 mg total) by mouth daily., Disp: 30 tablet, Rfl: 11   polyethylene glycol (MIRALAX / GLYCOLAX) 17 g packet, Take 17 g by mouth 3 (three) times daily., Disp: , Rfl:    REPATHA SURECLICK 867 MG/ML SOAJ, INJECT 1 PEN INTO THE SKIN EVERY 14 DAYS., Disp: 2 mL, Rfl: 11   senna-docusate (SENOKOT-S) 8.6-50 MG tablet, Take 1 tablet by mouth at bedtime., Disp: 30 tablet, Rfl: 0   spironolactone (ALDACTONE) 25 MG tablet, Take 0.5 tablets (12.5 mg total) by mouth daily., Disp: 45 tablet, Rfl: 3   Today's Vitals   12/26/21 0153 12/26/21 0526 12/26/21 0801 12/26/21 1045  BP: (!) 147/83 (!) 143/86 (!) 158/96 (!) 169/91  Pulse: 60 64 64 68  Resp: 16 15 17 14   Temp:  97.9 F (36.6 C) 98.5 F (36.9 C)   TempSrc:   Oral   SpO2: 100% 98% 100% 98%  Weight:      Height:      PainSc:       Body mass index is 23.24 kg/m.   CARDIAC STUDIES:  EKG 12/25/2021: Atypical atrial flutter 60 bpm RSR(V1) -nondiagnostic Nonspecific T-abnormality   Coronary and bypass graft angiography 01/2019: Severe native coronary artery disease with diffuse proximal to mid LAD stenosis with diffusely diseased native LAD proximally and mid with competitive filling from the LIMA graft; 90% proximal stenosis in the ramus immediate vessel, diffuse stenosis of 60 to 70% in the proximal circumflex with diffuse 80% distal stenosis and total occlusion of the AV groove after marginal vessel with  total occlusion of the proximal native RCA.  There is extensive collateralization to the distal RCA both via the circumflex vessel as well as via the LIMA to LAD and distal LAD.   Patent LIMA graft supplying the mid LAD   Previously documented old occlusion of the previous Y graft which had supplied the ramus and marginal vessel in the vein graft which had supplied the PDA.   Low right heart pressures with mean PA pressure at 19 mmHg.   RECOMMENDATION: Increase medical therapy.  The patient has only been on low-dose anti-ischemic medical regimen consisting of bisoprolol 2.5 mg as well as isosorbide 60 mg.  Recommend initiation of amlodipine 5 mg.  Also recommend consideration of Ranexa 500 mg twice a day.  The patient is on rosuvastatin.  However if the patient is to be on atorvastatin, the maximum recommended dose of atorvastatin is 40 mg if the patient is on ranolazine.     Echocardiogram 08/28/2021: 1. Left ventricular ejection fraction, by estimation, is 50 to 55%. The left ventricle has low normal function. The left ventricle demonstrates regional wall motion abnormalities. Basal anterior/anteroseptal hypokinesis. There is severe asymmetric left ventricular hypertrophy of the basal-septal segment. Left ventricular diastolic parameters are indeterminate. 2. Right ventricular systolic function is moderately reduced. The right ventricular size is normal. There is normal pulmonary artery systolic pressure. 3. The mitral valve is normal in structure. Trivial mitral valve regurgitation. No evidence of mitral stenosis. 4. The inferior vena cava is normal in size with greater than 50% respiratory variability, suggesting right atrial pressure of 3 mmHg. 5. The aortic  valve is calcified. There is moderate calcification of the aortic valve. Aortic valve regurgitation is not visualized. Moderate aortic valve stenosis. Mean gradient only 45mmHg, but AVA 1.0 cm^2 and DI 0.35, suspect paradoxical low flow low  gradient moderate AS given low SV index (21cc/m^2) 6. Small mobile echodensity adjacent to RV pacemaker lead (seen on image 14), could represent fibrinous strand or small adherent thrombus; if clinical concern for infection would check blood cultures  Assessment/Plan  81 year old caucasian male with coronary artery disease status s/p CABG (patent LIMA-LAD, occluded SVG-ramus/OM Y graft), ischemic cardiomyopathy with recovered EF, s/p ICD, mod AS, persistent Afib, PAD s/o aortobifemoral bypass, mod carotid disease, h/o stroke, CKD III  Chest pain, exertional dyspnea: Class III dyspnea symptoms with recurrent exertional chest pain.  He has had chronically elevated troponin and BNP during recent ER visits.  I suspect he has worsening ischemic cardiomyopathy, not withstanding improved EF reported on echocardiogram in 07/2021.   I recommend hospitalization for IV diuresis, followed by consideration for ischemic work-up subject to his renal function. I do not think this can be adequately achieved on an outpatient basis. Recommend IV Lasix 40 mg twice daily with daily intake/output and daily weights. Also, add Imdur 30 mg daily for angina relief. Recommend echocardiogram Continue bisoprolol, Repatha. In addition to ongoing use of Eliquis for atrial fibrillation, recommend adding aspirin 81 mg daily given his extensive CAD and suspected worsening ischemic cardiomyopathy. Continue low-dose spironolactone given baseline HFrEF with recovered EF   Persistent A. fib/atrial flutter: Longstanding.  Rate controlled. High CHA2DS2VAsc score Continue Eliquis for now.  If any possibility for cardiac catheterization this hospitalization, will switch him to heparin.   H/o NSVT: Continue amiodarone 200 mg daily.   Urinary retention: Chronic complaint.  Reportedly, patient performs self-catheterization at home. Monitor for now. If he has retention, will place indwelling Foley catheter.   PAD: S/p aortobifem  bypass.  No critical limb ischemia.     Nigel Mormon, MD Pager: [phone] Office: [phone]

## 2021-12-26 NOTE — ED Provider Notes (Signed)
[facility] EMERGENCY DEPARTMENT Provider Note   CSN: [id number] Arrival date & time: 12/25/21  1429     History  Chief Complaint  Patient presents with   Shortness of Breath    Ryan [name] is a 81 y.o. male.  The history is provided by the patient, the spouse, medical records and a caregiver. No language interpreter was used.  Shortness of Breath Severity:  Moderate Onset quality:  Gradual Timing:  Intermittent Progression:  Waxing and waning Chronicity:  Recurrent Relieved by:  Nothing Worsened by:  Exertion Ineffective treatments:  None tried Associated symptoms: chest pain   Associated symptoms: no abdominal pain, no cough, no diaphoresis, no fever, no headaches, no neck pain, no rash, no sputum production, no vomiting and no wheezing       Home Medications Prior to Admission medications   Medication Sig Start Date End Date Taking? Authorizing Provider  acetaminophen (TYLENOL) 325 MG tablet Take 2 tablets (650 mg total) by mouth every 6 (six) hours as needed for mild pain (or Fever >/= 101). 11/19/21   Raiford Noble Latif, DO  allopurinol (ZYLOPRIM) 100 MG tablet Take 100 mg by mouth daily as needed (gout). 05/29/20   [provider]  ALPRAZolam Duanne Moron) 0.5 MG tablet Take 0.5 mg by mouth 3 (three) times daily as needed for anxiety or sleep. 10/07/21   [provider]  amiodarone (PACERONE) 200 MG tablet Take 1 tablet (200 mg total) by mouth daily. 09/11/21   Mercy Riding, MD  apixaban (ELIQUIS) 5 MG TABS tablet Take 1 tablet (5 mg total) by mouth 2 (two) times daily. 11/12/21 11/12/22  [name] T, PA-C  bisoprolol (ZEBETA) 5 MG tablet Take 0.5 tablets (2.5 mg total) by mouth daily. 11/12/21 11/12/22  [name] T, PA-C  finasteride (PROSCAR) 5 MG tablet Take 5 mg by mouth daily. 08/18/21   [provider]  furosemide (LASIX) 20 MG tablet Take 1 tablet (20 mg total) by mouth every other day. 11/12/21 05/11/22  [name] T, PA-C  levothyroxine (SYNTHROID) 25 MCG tablet Take 25 mcg by mouth daily. 07/02/21   [provider]  metoCLOPramide (REGLAN) 5 MG tablet Take 1 tablet (5 mg total) by mouth every 6 (six) hours as needed for nausea. 11/13/21   Carlisle Cater, PA-C  nitroGLYCERIN (NITROSTAT) 0.4 MG SL tablet Place 1 tablet (0.4 mg total) under the tongue every 5 (five) minutes as needed for chest pain. 07/09/21   Evans Lance, MD  ondansetron (ZOFRAN-ODT) 4 MG disintegrating tablet Take 4 mg by mouth every 6 (six) hours as needed for nausea/vomiting. 10/30/21   [provider]  pantoprazole (PROTONIX) 40 MG tablet Take 1 tablet (40 mg total) by mouth daily. 11/12/21   [name] T, PA-C  polyethylene glycol (MIRALAX / GLYCOLAX) 17 g packet Take 17 g by mouth 3 (three) times daily.    [provider]  REPATHA SURECLICK 371 MG/ML SOAJ INJECT 1 PEN INTO THE SKIN EVERY 14 DAYS. 09/16/21   [name], [name], MD  senna-docusate (SENOKOT-S) 8.6-50 MG tablet Take 1 tablet by mouth at bedtime. 11/19/21   Raiford Noble Latif, DO  spironolactone (ALDACTONE) 25 MG tablet Take 0.5 tablets (12.5 mg total) by mouth daily. 10/16/21 10/11/22  Loel Dubonnet, NP      Allergies    Jardiance [empagliflozin], Ezetimibe, Ace inhibitors, Aspirin, Atorvastatin, Ezetimibe-simvastatin, Codeine, and Unithroid [levothyroxine sodium]    Review of Systems   Review of Systems  Constitutional:  Positive for fatigue. Negative for chills, diaphoresis and fever.  HENT:  Negative for congestion.   Respiratory:  Positive for chest tightness and shortness of breath. Negative for cough, sputum production and wheezing.   Cardiovascular:  Positive for chest pain.  Gastrointestinal:  Negative for abdominal pain, constipation, diarrhea, nausea and vomiting.  Genitourinary:  Negative for dysuria and flank pain.  Musculoskeletal:  Negative for back pain, neck pain and neck stiffness.  Skin:  Negative for rash and  wound.  Neurological:  Negative for dizziness, light-headedness and headaches.  Psychiatric/Behavioral:  Negative for agitation.   All other systems reviewed and are negative.  Physical Exam Updated Vital Signs BP (!) 158/96 (BP Location: Right Arm)    Pulse 64    Temp 98.5 F (36.9 C) (Oral)    Resp 17    Ht 5\' 6"  (1.676 m)    Wt 65.3 kg    SpO2 100%    BMI 23.24 kg/m  Physical Exam Vitals and nursing note reviewed.  Constitutional:      General: He is not in acute distress.    Appearance: He is well-developed. He is not ill-appearing, toxic-appearing or diaphoretic.  HENT:     Head: Normocephalic and atraumatic.  Eyes:     Extraocular Movements: Extraocular movements intact.     Conjunctiva/sclera: Conjunctivae normal.     Pupils: Pupils are equal, round, and reactive to light.  Cardiovascular:     Rate and Rhythm: Normal rate and regular rhythm.     Heart sounds: No murmur heard. Pulmonary:     Effort: Pulmonary effort is normal. No tachypnea or respiratory distress.     Breath sounds: Rales present. No decreased breath sounds, wheezing or rhonchi.  Chest:     Chest wall: No tenderness.  Abdominal:     Palpations: Abdomen is soft.     Tenderness: There is no abdominal tenderness.  Musculoskeletal:        General: No swelling.     Cervical back: Neck supple.     Right lower leg: No tenderness. Edema present.     Left lower leg: No tenderness. Edema present.  Skin:    General: Skin is warm and dry.     Capillary Refill: Capillary refill takes less than 2 seconds.     Findings: No erythema.  Neurological:     General: No focal deficit present.     Mental Status: He is alert.  Psychiatric:        Mood and Affect: Mood normal.    ED Results / Procedures / Treatments   Labs (all labs ordered are listed, but only abnormal results are displayed) Labs Reviewed  BASIC METABOLIC PANEL - Abnormal; Notable for the following components:      Result Value   Creatinine, Ser  1.52 (*)    Calcium 8.7 (*)    GFR, Estimated 46 (*)    All other components within normal limits  BRAIN NATRIURETIC PEPTIDE - Abnormal; Notable for the following components:   B Natriuretic Peptide 864.7 (*)    All other components within normal limits  CBC WITH DIFFERENTIAL/PLATELET - Abnormal; Notable for the following components:   MCV 100.5 (*)    All other components within normal limits  TROPONIN I (HIGH SENSITIVITY) - Abnormal; Notable for the following components:   Troponin I (High Sensitivity) 41 (*)    All other components within normal limits  TROPONIN I (HIGH SENSITIVITY) - Abnormal; Notable for the following components:  Troponin I (High Sensitivity) 41 (*)    All other components within normal limits  RESP PANEL BY RT-PCR (FLU A&B, COVID) ARPGX2  CBC  CREATININE, SERUM  TROPONIN I (HIGH SENSITIVITY)    EKG EKG Interpretation  Date/Time:  Thursday December 25 2021 14:53:31 EST Ventricular Rate:  63 PR Interval:    QRS Duration: 86 QT Interval:  498 QTC Calculation: 509 R Axis:   11 Text Interpretation: Atrial flutter with variable A-V block Nonspecific T wave abnormality Prolonged QT Abnormal ECG When compared with ECG of 23-Dec-2021 14:48, PREVIOUS ECG IS PRESENT When compared to prior, similar afib. NO STEMI Confirmed by Antony Blackbird [phone]) on 12/26/2021 10:54:32 AM  Radiology DG Chest 2 View  Result Date: 12/25/2021 CLINICAL DATA:  Chest pain EXAM: CHEST - 2 VIEW COMPARISON:  Chest x-ray 12/23/2021 FINDINGS: Heart is enlarged. Mediastinum appears stable. Calcified plaques in the aortic arch. Left-sided cardiac device, cardiac surgical changes and median sternotomy wires. No focal consolidation identified. Small bilateral pleural effusions similar to previous. No pneumothorax. IMPRESSION: Cardiomegaly and small bilateral pleural effusions. Electronically Signed   By: Ofilia Neas M.D.   On: 12/25/2021 16:21    Procedures Procedures    Medications  Ordered in ED Medications  albuterol (VENTOLIN HFA) 108 (90 Base) MCG/ACT inhaler 2 puff (has no administration in time range)  sodium chloride flush (NS) 0.9 % injection 3 mL (has no administration in time range)  sodium chloride flush (NS) 0.9 % injection 3 mL (has no administration in time range)  0.9 %  sodium chloride infusion (has no administration in time range)  acetaminophen (TYLENOL) tablet 650 mg (has no administration in time range)  furosemide (LASIX) injection 40 mg (has no administration in time range)  allopurinol (ZYLOPRIM) tablet 100 mg (has no administration in time range)  amiodarone (PACERONE) tablet 200 mg (200 mg Oral Not Given 12/26/21 1145)  apixaban (ELIQUIS) tablet 5 mg (has no administration in time range)  bisoprolol (ZEBETA) tablet 2.5 mg (2.5 mg Oral Not Given 12/26/21 1142)  finasteride (PROSCAR) tablet 5 mg (has no administration in time range)  levothyroxine (SYNTHROID) tablet 25 mcg (has no administration in time range)  senna-docusate (Senokot-S) tablet 1 tablet (has no administration in time range)  pantoprazole (PROTONIX) EC tablet 40 mg (has no administration in time range)  polyethylene glycol (MIRALAX / GLYCOLAX) packet 17 g (has no administration in time range)  nitroGLYCERIN (NITROSTAT) SL tablet 0.4 mg (has no administration in time range)  ondansetron (ZOFRAN-ODT) disintegrating tablet 4 mg (has no administration in time range)  isosorbide mononitrate (IMDUR) 24 hr tablet 30 mg (has no administration in time range)    ED Course/ Medical Decision Making/ A&P                           Medical Decision Making  ROMUALDO PROSISE is a 81 y.o. male with a past medical history significant for CAD status post CABG, hypertension, hyperlipidemia, CKD, CHF, and previous VT with ICD placement who presents from cardiology office for admission for work-up and diuresis of worsening shortness of breath and chest discomfort.  According to patient, he was told to  come to the emergency department as his cardiologist, Dr. Virgina Jock, was unable to secure a direct admission room yesterday in clinic when patient was noted to have worsening symptoms.  Of note, patient had waited 20 hours and 25 minutes prior to my initial evaluation in the emergency department.  Patient says that is while waiting in the emergency department he has had episodes of chest discomfort and still is having exertional shortness of breath.  He reports that his legs are more edematous than normal.  He is currently denying any abdominal pain or flank pain.  Denies current chest pain.  Chart review shows that patient saw his cardiologist today who wants admitted for IV diuresis as he has failed outpatient diuresis plans.   After assessing patient, I called cardiology who will admit patient for this plan.  They will place the orders.  Patient was requesting a Foley catheter however he was able to urinate just prior to being roomed so we agreed to hold on the catheter initially but if he gets retention recurrence or difficulty doing so, he may need Foley placement for strict ins and out monitoring given his tight fluid balance control needs.  Patient will be admitted to cardiology for further management of fluid overload.  We will repeat troponins given the recent chest pain in the waiting room.         Final Clinical Impression(s) / ED Diagnoses Final diagnoses:  Exertional shortness of breath  Hypervolemia, unspecified hypervolemia type  Chest pain, unspecified type    Rx / DC Orders ED Discharge Orders     None       Clinical Impression: 1. Exertional shortness of breath   2. Hypervolemia, unspecified hypervolemia type   3. Chest pain, unspecified type     Disposition: Admit  This note was prepared with assistance of Dragon voice recognition software. Occasional wrong-word or sound-a-like substitutions may have occurred due to the inherent limitations of voice  recognition software.      Boots Mcglown, Gwenyth Allegra, MD 12/26/21 1201

## 2021-12-26 NOTE — Patient Outreach (Signed)
[facility]) Care Management  12/26/2021  JENARO SOUDER 12-02-1941 [id number]   Telephone Assessment-Unsuccessful  RN attempted outreach today however unsuccessful. RN was able to leave a HIPAA approved voice message requesting a call back.  Will follow up once again next week for ongoing [facility] services.  [name], RN Care Management Coordinator [place] Office [phone]

## 2021-12-27 ENCOUNTER — Inpatient Hospital Stay (HOSPITAL_COMMUNITY): Payer: Medicare Other

## 2021-12-27 ENCOUNTER — Other Ambulatory Visit (HOSPITAL_COMMUNITY): Payer: Medicare Other

## 2021-12-27 LAB — CBC WITH DIFFERENTIAL/PLATELET
Abs Immature Granulocytes: 0.04 K/uL (ref 0.00–0.07)
Basophils Absolute: 0 K/uL (ref 0.0–0.1)
Basophils Relative: 0 %
Eosinophils Absolute: 0.1 K/uL (ref 0.0–0.5)
Eosinophils Relative: 1 %
HCT: 41.7 % (ref 39.0–52.0)
Hemoglobin: 13.9 g/dL (ref 13.0–17.0)
Immature Granulocytes: 1 %
Lymphocytes Relative: 14 %
Lymphs Abs: 1.1 K/uL (ref 0.7–4.0)
MCH: 32.4 pg (ref 26.0–34.0)
MCHC: 33.3 g/dL (ref 30.0–36.0)
MCV: 97.2 fL (ref 80.0–100.0)
Monocytes Absolute: 0.8 K/uL (ref 0.1–1.0)
Monocytes Relative: 10 %
Neutro Abs: 6.1 K/uL (ref 1.7–7.7)
Neutrophils Relative %: 74 %
Platelets: 272 K/uL (ref 150–400)
RBC: 4.29 MIL/uL (ref 4.22–5.81)
RDW: 14.1 % (ref 11.5–15.5)
WBC: 8.1 K/uL (ref 4.0–10.5)
nRBC: 0 % (ref 0.0–0.2)

## 2021-12-27 LAB — ECHOCARDIOGRAM COMPLETE
AR max vel: 0.72 cm2
AV Area VTI: 0.72 cm2
AV Area mean vel: 0.69 cm2
AV Mean grad: 9.8 mmHg
AV Peak grad: 16.7 mmHg
Ao pk vel: 2.04 m/s
Calc EF: 55.3 %
Height: 66 in
S' Lateral: 3.6 cm
Single Plane A2C EF: 58.9 %
Single Plane A4C EF: 49.1 %
Weight: 2243.19 oz

## 2021-12-27 LAB — BASIC METABOLIC PANEL
Anion gap: 8 (ref 5–15)
BUN: 19 mg/dL (ref 8–23)
CO2: 25 mmol/L (ref 22–32)
Calcium: 8.8 mg/dL — ABNORMAL LOW (ref 8.9–10.3)
Chloride: 104 mmol/L (ref 98–111)
Creatinine, Ser: 1.41 mg/dL — ABNORMAL HIGH (ref 0.61–1.24)
GFR, Estimated: 50 mL/min — ABNORMAL LOW (ref >60)
Glucose, Bld: 89 mg/dL (ref 70–99)
Potassium: 3.6 mmol/L (ref 3.5–5.1)
Sodium: 137 mmol/L (ref 135–145)

## 2021-12-27 MED ORDER — PERFLUTREN LIPID MICROSPHERE
1.0000 mL | INTRAVENOUS | Status: AC | PRN
  Administered 2021-12-27: 2 mL via INTRAVENOUS
  Filled 2021-12-27: qty 10

## 2021-12-27 MED ORDER — FUROSEMIDE 10 MG/ML IJ SOLN
20.0000 mg | Freq: Two times a day (BID) | INTRAMUSCULAR | Status: DC
  Administered 2021-12-27 – 2021-12-28 (×3): 20 mg via INTRAVENOUS
  Filled 2021-12-27 (×2): qty 2

## 2021-12-27 MED ORDER — SPIRONOLACTONE 12.5 MG HALF TABLET
12.5000 mg | ORAL_TABLET | Freq: Every day | ORAL | Status: DC
  Administered 2021-12-27 – 2021-12-28 (×2): 12.5 mg via ORAL
  Filled 2021-12-27 (×2): qty 1

## 2021-12-27 NOTE — Social Work (Signed)
CSW acknowledges consult for SNF/HH. The patient will require PT/OT evaluations. TOC will assist with disposition planning once the evaluations have been completed.  °  °TOC will continue to follow.    °

## 2021-12-27 NOTE — Progress Notes (Signed)
Subjective:  Feels better  Objective:  Vital Signs in the last 24 hours: Temp:  [97.4 F (36.3 C)-97.8 F (36.6 C)] 97.4 F (36.3 C) (01/07 0745) Pulse Rate:  [45-72] 63 (01/07 0745) Resp:  [14-22] 16 (01/07 0745) BP: (94-169)/(52-94) 103/59 (01/07 0745) SpO2:  [71 %-100 %] 96 % (01/07 0745) Weight:  [63.6 kg] 63.6 kg (01/07 0437)  Intake/Output from previous day: 01/06 0701 - 01/07 0700 In: 240 [P.O.:240] Out: 2050 [Urine:2050]  Physical Exam Vitals and nursing note reviewed.  Constitutional:      General: He is not in acute distress.    Appearance: He is well-developed.  HENT:     Head: Normocephalic and atraumatic.  Eyes:     Conjunctiva/sclera: Conjunctivae normal.     Pupils: Pupils are equal, round, and reactive to light.  Neck:     Vascular: JVD present.  Cardiovascular:     Rate and Rhythm: Normal rate and regular rhythm.     Pulses: Normal pulses and intact distal pulses.     Heart sounds: Murmur heard.  Harsh midsystolic murmur is present with a grade of 2/6 at the upper right sternal border radiating to the neck.  Pulmonary:     Effort: Pulmonary effort is normal.     Breath sounds: Normal breath sounds. No wheezing or rales.  Abdominal:     General: Bowel sounds are normal.     Palpations: Abdomen is soft.     Tenderness: There is no rebound.  Musculoskeletal:        General: No tenderness. Normal range of motion.     Right lower leg: Edema (Trace) present.     Left lower leg: Edema (Trace) present.  Lymphadenopathy:     Cervical: No cervical adenopathy.  Skin:    General: Skin is warm and dry.  Neurological:     Mental Status: He is alert and oriented to person, place, and time.     Cranial Nerves: No cranial nerve deficit.     Lab Results: BMP Recent Labs    01/15/21 1148 02/07/21 0934 03/22/21 0004 12/23/21 1457 12/25/21 1511 12/26/21 1203 12/27/21 0352  NA 138 140   < > 138 137  --  137  K 5.2 4.4   < > 4.1 4.0  --  3.6  CL 102  103   < > 101 102  --  104  CO2 20 19*   < > 29 24  --  25  GLUCOSE 91 113*   < > 106* 95  --  89  BUN 25 22   < > 17 18  --  19  CREATININE 1.54* 1.25   < > 1.48* 1.52* 1.30* 1.41*  CALCIUM 9.4 9.0   < > 8.7* 8.7*  --  8.8*  GFRNONAA 42* 54*   < > 48* 46* 56* 50*  GFRAA 49* 63  --   --   --   --   --    < > = values in this interval not displayed.    CBC Recent Labs  Lab 12/27/21 0352  WBC 8.1  RBC 4.29  HGB 13.9  HCT 41.7  PLT 272  MCV 97.2  MCH 32.4  MCHC 33.3  RDW 14.1  LYMPHSABS 1.1  MONOABS 0.8  EOSABS 0.1  BASOSABS 0.0    HEMOGLOBIN A1C Lab Results  Component Value Date   HGBA1C 6.0 (H) 09/11/2021   MPG 126 09/11/2021    Cardiac Panel (last 3 results) No  results for input(s): CKTOTAL, CKMB, TROPONINI, RELINDX in the last 8760 hours.  BNP (last 3 results) Recent Labs    12/15/21 0101 12/23/21 1457 12/25/21 1511  BNP 1,878.1* 1,115.7* 864.7*    TSH Recent Labs    03/22/21 1459 08/27/21 1934 09/11/21 0341  TSH 7.434* 11.949* 5.069*    Lipid Panel     Component Value Date/Time   CHOL 99 (L) 07/29/2020 1213   TRIG 72 07/29/2020 1213   HDL 39 (L) 07/29/2020 1213   CHOLHDL 2.5 07/29/2020 1213   CHOLHDL 3.8 02/08/2018 1003   VLDL 22 02/08/2018 1003   LDLCALC 45 07/29/2020 1213     Hepatic Function Panel Recent Labs    11/13/21 1443 11/18/21 0748 11/19/21 0103 11/24/21 1647  PROT 6.9 5.7* 5.4* 6.6  ALBUMIN 4.0 3.0* 2.7* 4.0  AST 25 26 20 28   ALT 14 18 15 26   ALKPHOS 53 52 44 65  BILITOT 1.2 1.8* 0.9 0.8  BILIDIR 0.3*  --   --   --   IBILI 0.9  --   --   --     Imaging: CXR 12/25/2021: Cardiomegaly and small bilateral pleural effusions.  Cardiac Studies:  Echocardiogram 12/27/2021:  1. Left ventricular ejection fraction, by estimation, is 50 to 55%. The  left ventricle has low normal function. The left ventricle demonstrates  regional wall motion abnormalities (see scoring diagram/findings for  description). Diastolic function  not  assessed due to Afib. There is severe hypokinesis of the left ventricular,  basal anteroseptal wall.   2. Right ventricular systolic function is mildly reduced. The right  ventricular size is normal. There is mildly elevated pulmonary artery  systolic pressure.   3. Left atrial size was severely dilated.   4. Right atrial size was moderately dilated.   5. The mitral valve is grossly normal. Mild mitral valve regurgitation.   6. Mild tricuspid regurgitation. peak RA-RV gradient 33 mmHg. . Tricuspid  valve regurgitation is mild.   7. Vmax 2.3 m/sec, mean PG 13 mmHg. AVA by continuity equation is 0.9  cm2, with dimensionless index of 0.24. Small LV cavity with LV SV index of  20 cc/m2. Likely paradoxically low flow low gradient severe aortic  stenosis. . The aortic valve is  tricuspid. There is moderate calcification of the aortic valve.   8. Overall, LVEF assessment remains difficult. No significant change  compared to previous study in 08/2021.   EKG 12/25/2021: Atypical atrial flutter 60 bpm RSR(V1) -nondiagnostic Nonspecific T-abnormality   Coronary and bypass graft angiography 01/2019: Severe native coronary artery disease with diffuse proximal to mid LAD stenosis with diffusely diseased native LAD proximally and mid with competitive filling from the LIMA graft; 90% proximal stenosis in the ramus immediate vessel, diffuse stenosis of 60 to 70% in the proximal circumflex with diffuse 80% distal stenosis and total occlusion of the AV groove after marginal vessel with total occlusion of the proximal native RCA.  There is extensive collateralization to the distal RCA both via the circumflex vessel as well as via the LIMA to LAD and distal LAD.   Patent LIMA graft supplying the mid LAD   Previously documented old occlusion of the previous Y graft which had supplied the ramus and marginal vessel in the vein graft which had supplied the PDA.   Low right heart pressures with mean PA  pressure at 19 mmHg.   RECOMMENDATION: Increase medical therapy.  The patient has only been on low-dose anti-ischemic medical regimen consisting of bisoprolol  2.5 mg as well as isosorbide 60 mg.  Recommend initiation of amlodipine 5 mg.  Also recommend consideration of Ranexa 500 mg twice a day.  The patient is on rosuvastatin.  However if the patient is to be on atorvastatin, the maximum recommended dose of atorvastatin is 40 mg if the patient is on ranolazine.     Assessment/Plan   81 year old caucasian male with coronary artery disease status s/p CABG (patent LIMA-LAD, occluded SVG-ramus/OM Y graft), ischemic cardiomyopathy with recovered EF, s/p ICD, mod AS, persistent Afib, PAD s/o aortobifemoral bypass, mod carotid disease, h/o stroke, CKD III   Acute on chronic HFpEF: Likely combination of ischemic cardiomyopathy, also component of mild RV systolic dysfunction. He possibly also has paradoxical low flow low gradient aortic stenosis, which may also be contributing. He is diuresed very well with IV Lasix 40 mg twice daily.  Reduced to IV Lasix 20 mg twice daily today. Resume spironolactone 12.5 mg daily. Continue strict I's/O, daily weights. Optimistic about discharge tomorrow morning.  He will need outpatient right and left heart catheterization, after stabilizing his heart failure and renal function.  CAD: Suspected mild chronic ischemia at baseline, with mildly elevated but flat troponins. Continue aspirin 81 mg, bisoprolol 2.5 mg, Imdur 30 mg, Repatha. We will plan on performing outpatient left and right heart catheterization to assess for any obstructive CAD, as well as invasive hemodynamic assessment of aortic valve.  Persistent A. fib/atrial flutter: Longstanding.  Rate controlled. High CHA2DS2VAsc score Continue Eliquis 5 mg daily.     H/o NSVT: Continue amiodarone 200 mg daily.   CKD stage III: Creatinine mildly elevated, not amounting to AKI.   Monitor closely with  continued diuresis.    PAD: S/p aortobifem bypass.  No critical limb ischemia.    Nigel Mormon, MD Pager: [phone] Office: [phone]

## 2021-12-27 NOTE — Progress Notes (Signed)
°  Echocardiogram 2D Echocardiogram with contrast has been performed.  Merrie Roof F 12/27/2021, 10:16 AM

## 2021-12-28 ENCOUNTER — Other Ambulatory Visit: Payer: Self-pay | Admitting: Cardiology

## 2021-12-28 DIAGNOSIS — I5020 Unspecified systolic (congestive) heart failure: Secondary | ICD-10-CM

## 2021-12-28 LAB — CBC WITH DIFFERENTIAL/PLATELET
Abs Immature Granulocytes: 0.03 10*3/uL (ref 0.00–0.07)
Basophils Absolute: 0 10*3/uL (ref 0.0–0.1)
Basophils Relative: 1 %
Eosinophils Absolute: 0.1 10*3/uL (ref 0.0–0.5)
Eosinophils Relative: 1 %
HCT: 40.9 % (ref 39.0–52.0)
Hemoglobin: 13.5 g/dL (ref 13.0–17.0)
Immature Granulocytes: 0 %
Lymphocytes Relative: 18 %
Lymphs Abs: 1.5 10*3/uL (ref 0.7–4.0)
MCH: 32.1 pg (ref 26.0–34.0)
MCHC: 33 g/dL (ref 30.0–36.0)
MCV: 97.4 fL (ref 80.0–100.0)
Monocytes Absolute: 0.9 10*3/uL (ref 0.1–1.0)
Monocytes Relative: 11 %
Neutro Abs: 5.7 10*3/uL (ref 1.7–7.7)
Neutrophils Relative %: 69 %
Platelets: 258 10*3/uL (ref 150–400)
RBC: 4.2 MIL/uL — ABNORMAL LOW (ref 4.22–5.81)
RDW: 14.1 % (ref 11.5–15.5)
WBC: 8.3 10*3/uL (ref 4.0–10.5)
nRBC: 0 % (ref 0.0–0.2)

## 2021-12-28 LAB — BASIC METABOLIC PANEL
Anion gap: 11 (ref 5–15)
BUN: 21 mg/dL (ref 8–23)
CO2: 27 mmol/L (ref 22–32)
Calcium: 8.6 mg/dL — ABNORMAL LOW (ref 8.9–10.3)
Chloride: 100 mmol/L (ref 98–111)
Creatinine, Ser: 1.54 mg/dL — ABNORMAL HIGH (ref 0.61–1.24)
GFR, Estimated: 45 mL/min — ABNORMAL LOW (ref >60)
Glucose, Bld: 91 mg/dL (ref 70–99)
Potassium: 3.7 mmol/L (ref 3.5–5.1)
Sodium: 138 mmol/L (ref 135–145)

## 2021-12-28 MED ORDER — TORSEMIDE 20 MG PO TABS: 20.0000 mg | ORAL_TABLET | Freq: Every day | ORAL | 3 refills | Status: DC

## 2021-12-28 MED ORDER — ISOSORBIDE MONONITRATE ER 30 MG PO TB24: 30.0000 mg | ORAL_TABLET | Freq: Every day | ORAL | 2 refills | Status: DC

## 2021-12-28 NOTE — Discharge Summary (Signed)
Physician Discharge Summary  Patient ID: Ryan [name] MRN: [id number] DOB/AGE: 05-22-1941 81 y.o.  Admit date: 12/25/2021 Discharge date: 12/28/2021  Primary Discharge Diagnosis: Acute on chronic HFpEF Paradoxically low-flow low gradient aortic stenosis Coronary artery disease  Secondary Discharge Diagnosis: Paroxysmal atrial fibrillation   Hospital Course:   81 year old caucasian male with coronary artery disease status s/p CABG (patent LIMA-LAD, occluded SVG-ramus/OM Y graft), ischemic cardiomyopathy with recovered EF, s/p ICD, mod AS, persistent Afib, PAD s/o aortobifemoral bypass, mod carotid disease, h/o stroke, CKD III  Patient was admitted with worsening chest pain and exertional dyspnea.  He was found to be in mild decompensation of HFpEF.  He was diuresed with IV Lasix with significant improvement in his dyspnea.  He was also started on Imdur with improvement in chest pain.  Creatinine decreased with diuresis, but increased again on 12/28/2018 23-1.5.  However, he is diuresing very well-unfortunately not all accounted for due to misunderstanding on patient's part regarding urine collection.  However, given clinical improvement, I think it is reasonable to discharge him today with close follow-up of his creatinine in the coming week.  Changed diuretics to p.o. torsemide 20 mg daily starting 12/29/2021.  If creatinine remains >1.5, will need to reduce his Eliquis dose to 2.5 mg twice daily.  Discharge Exam: Blood pressure 103/65, pulse (!) 55, temperature 97.6 F (36.4 C), temperature source Oral, resp. rate 18, height 5\' 6"  (1.676 m), weight 62.8 kg, SpO2 98 %.  Physical Exam Vitals and nursing note reviewed.  Constitutional:      General: He is not in acute distress.    Appearance: He is well-developed.  HENT:     Head: Normocephalic and atraumatic.  Eyes:     Conjunctiva/sclera: Conjunctivae normal.     Pupils: Pupils are equal, round, and reactive to light.  Neck:      Vascular: No JVD.  Cardiovascular:     Rate and Rhythm: Normal rate. Rhythm irregular.     Pulses: Normal pulses and intact distal pulses.     Heart sounds: No murmur heard. Pulmonary:     Effort: Pulmonary effort is normal.     Breath sounds: Normal breath sounds. No wheezing or rales.  Abdominal:     General: Bowel sounds are normal.     Palpations: Abdomen is soft.     Tenderness: There is no rebound.  Musculoskeletal:        General: No tenderness. Normal range of motion.     Right lower leg: No edema.     Left lower leg: No edema.  Lymphadenopathy:     Cervical: No cervical adenopathy.  Skin:    General: Skin is warm and dry.  Neurological:     Mental Status: He is alert and oriented to person, place, and time.     Cranial Nerves: No cranial nerve deficit.      Significant Diagnostic Studies:  Echocardiogram 12/27/2021:  1. Left ventricular ejection fraction, by estimation, is 50 to 55%. The  left ventricle has low normal function. The left ventricle demonstrates  regional wall motion abnormalities (see scoring diagram/findings for  description). Diastolic function not  assessed due to Afib. There is severe hypokinesis of the left ventricular,  basal anteroseptal wall.   2. Right ventricular systolic function is mildly reduced. The right  ventricular size is normal. There is mildly elevated pulmonary artery  systolic pressure.   3. Left atrial size was severely dilated.   4. Right atrial size was moderately  dilated.   5. The mitral valve is grossly normal. Mild mitral valve regurgitation.   6. Mild tricuspid regurgitation. peak RA-RV gradient 33 mmHg. . Tricuspid  valve regurgitation is mild.   7. Vmax 2.3 m/sec, mean PG 13 mmHg. AVA by continuity equation is 0.9  cm2, with dimensionless index of 0.24. Small LV cavity with LV SV index of  20 cc/m2. Likely paradoxically low flow low gradient severe aortic  stenosis. . The aortic valve is  tricuspid. There is moderate  calcification of the aortic valve.   8. Overall, LVEF assessment remains difficult. No significant change  compared to previous study in 08/2021.   EKG 12/25/2021: Atypical atrial flutter 60 bpm RSR(V1) -nondiagnostic Nonspecific T-abnormality   Coronary and bypass graft angiography 01/2019: Severe native coronary artery disease with diffuse proximal to mid LAD stenosis with diffusely diseased native LAD proximally and mid with competitive filling from the LIMA graft; 90% proximal stenosis in the ramus immediate vessel, diffuse stenosis of 60 to 70% in the proximal circumflex with diffuse 80% distal stenosis and total occlusion of the AV groove after marginal vessel with total occlusion of the proximal native RCA.  There is extensive collateralization to the distal RCA both via the circumflex vessel as well as via the LIMA to LAD and distal LAD.   Patent LIMA graft supplying the mid LAD   Previously documented old occlusion of the previous Y graft which had supplied the ramus and marginal vessel in the vein graft which had supplied the PDA.   Low right heart pressures with mean PA pressure at 19 mmHg.  Labs:   Lab Results  Component Value Date   WBC 8.3 12/28/2021   HGB 13.5 12/28/2021   HCT 40.9 12/28/2021   MCV 97.4 12/28/2021   PLT 258 12/28/2021    Recent Labs  Lab 12/28/21 0423  NA 138  K 3.7  CL 100  CO2 27  BUN 21  CREATININE 1.54*  CALCIUM 8.6*  GLUCOSE 91    Lipid Panel     Component Value Date/Time   CHOL 99 (L) 07/29/2020 1213   TRIG 72 07/29/2020 1213   HDL 39 (L) 07/29/2020 1213   CHOLHDL 2.5 07/29/2020 1213   CHOLHDL 3.8 02/08/2018 1003   VLDL 22 02/08/2018 1003   LDLCALC 45 07/29/2020 1213    BNP (last 3 results) Recent Labs    12/15/21 0101 12/23/21 1457 12/25/21 1511  BNP 1,878.1* 1,115.7* 864.7*    HEMOGLOBIN A1C Lab Results  Component Value Date   HGBA1C 6.0 (H) 09/11/2021   MPG 126 09/11/2021    Cardiac Panel (last 3 results) No  results for input(s): CKTOTAL, CKMB, TROPONINI, RELINDX in the last 8760 hours.  Lab Results  Component Value Date   CKTOTAL 301 (H) 03/06/2009   CKMB 3.1 03/06/2009   TROPONINI 0.06 (HH) 01/28/2019     TSH Recent Labs    03/22/21 1459 08/27/21 1934 09/11/21 0341  TSH 7.434* 11.949* 5.069*    Radiology: DG Chest 2 View  Result Date: 12/25/2021 CLINICAL DATA:  Chest pain EXAM: CHEST - 2 VIEW COMPARISON:  Chest x-ray 12/23/2021 FINDINGS: Heart is enlarged. Mediastinum appears stable. Calcified plaques in the aortic arch. Left-sided cardiac device, cardiac surgical changes and median sternotomy wires. No focal consolidation identified. Small bilateral pleural effusions similar to previous. No pneumothorax. IMPRESSION: Cardiomegaly and small bilateral pleural effusions. Electronically Signed   By: Ofilia Neas M.D.   On: 12/25/2021 16:21   DG Chest 2 View  Result Date: 12/23/2021 CLINICAL DATA:  Chest pain and shortness of breath today. EXAM: CHEST - 2 VIEW COMPARISON:  12/15/2021 FINDINGS: Pacemaker/AICD appears the same. Previous median sternotomy and CABG. Chronic interstitial lung markings. Slight worsening of atelectasis at both lung bases today. Small bilateral effusions. Findings could be due to pneumonia and/or congestive heart failure. IMPRESSION: Small bilateral effusions. Patchy density at both lung bases that could be pneumonia or atelectasis. Electronically Signed   By: Nelson Chimes M.D.   On: 12/23/2021 15:30   DG Chest Portable 1 View  Result Date: 12/15/2021 CLINICAL DATA:  Shortness of breath EXAM: PORTABLE CHEST 1 VIEW COMPARISON:  11/17/2021 FINDINGS: Cardiac shadow is enlarged. Postsurgical changes are noted. Defibrillator is again seen. Postsurgical changes in the left shoulder are noted and stable. Fractured fixation screw is noted in the scapula. Mild vascular congestion is noted with edema. No focal infiltrate is seen. IMPRESSION: Mild pulmonary edema.  Electronically Signed   By: Inez Catalina M.D.   On: 12/15/2021 01:40   ECHOCARDIOGRAM COMPLETE  Result Date: 12/27/2021    ECHOCARDIOGRAM LIMITED REPORT   Patient Name:   ESTER MABE Date of Exam: 12/26/2021 Medical Rec #:  [id number]         Height:       66.0 in Accession #:    [id number]        Weight:       144.0 lb Date of Birth:  October 11, 1941         BSA:          1.739 m Patient Age:    60 years          BP:           94/68 mmHg Patient Gender: M                 HR:           60 bpm. Exam Location:  Inpatient Procedure: 2D Echo, Intracardiac Opacification Agent, 3D Echo, Cardiac Doppler            and Color Doppler                               MODIFIED REPORT: This report was modified by Vernell Leep MD on 12/27/2021 due to Addition.  Indications:     CHF  History:         Patient has prior history of Echocardiogram examinations, most                  recent 08/28/2021. Previous Myocardial Infarction and CAD,                  Arrythmias:Atrial Fibrillation; Risk Factors:Hypertension and                  Dyslipidemia.  Sonographer:     Glo Herring Sonographer#2:   Merrie Roof RDCS Referring Phys:  [id number] [facility] J Lavetta Geier Diagnosing Phys: Vernell Leep MD IMPRESSIONS  1. Left ventricular ejection fraction, by estimation, is 50 to 55%. The left ventricle has low normal function. The left ventricle demonstrates regional wall motion abnormalities (see scoring diagram/findings for description). Diastolic function not assessed due to Afib. There is severe hypokinesis of the left ventricular, basal anteroseptal wall.  2. Right ventricular systolic function is mildly reduced. The right ventricular size is normal. There is mildly elevated pulmonary artery systolic pressure.  3. Left atrial size was severely  dilated.  4. Right atrial size was moderately dilated.  5. The mitral valve is grossly normal. Mild mitral valve regurgitation.  6. Mild tricuspid regurgitation. peak RA-RV gradient 33 mmHg. .  Tricuspid valve regurgitation is mild.  7. Vmax 2.3 m/sec, mean PG 13 mmHg. AVA by continuity equation is 0.9 cm2, with dimensionless index of 0.24. Small LV cavity with LV SV index of 20 cc/m2. Likely paradoxically low flow low gradient severe aortic stenosis. . The aortic valve is tricuspid. There is moderate calcification of the aortic valve.  8. Overall, LVEF assessment remains difficult. No significant change compared to previous study in 08/2021. FINDINGS  Left Ventricle: LVEF assessment variable in different views, ranging from 30-35% in PLAX and SAX views to 50-55% in LAX views, in spite of the use of Definity contrast. Left ventricular ejection fraction, by estimation, is 50 to 55%. The left ventricle has low normal function. The left ventricle demonstrates regional wall motion abnormalities. Severe hypokinesis of the left ventricular, basal anteroseptal wall. Definity contrast agent was given IV to delineate the left ventricular endocardial borders. The left ventricular internal cavity size was small. Diastolic function not assessed due to Afib. Right Ventricle: The right ventricular size is normal. No increase in right ventricular wall thickness. Right ventricular systolic function is mildly reduced. There is mildly elevated pulmonary artery systolic pressure. The tricuspid regurgitant velocity  is 2.89 m/s, and with an assumed right atrial pressure of 3 mmHg, the estimated right ventricular systolic pressure is 19.4 mmHg. Left Atrium: Left atrial size was severely dilated. Right Atrium: Right atrial size was moderately dilated. Mitral Valve: The mitral valve is grossly normal. Mild mitral valve regurgitation. Tricuspid Valve: Mild tricuspid regurgitation. peak RA-RV gradient 33 mmHg. The tricuspid valve is grossly normal. Tricuspid valve regurgitation is mild. Aortic Valve: Vmax 2.3 m/sec, mean PG 13 mmHg. AVA by continuity equation is 0.9 cm2, with dimensionless index of 0.24. Small LV cavity with LV SV  index of 20 cc/m2. Likely paradoxically low flow low gradient severe aortic stenosis. The aortic valve is tricuspid. There is moderate calcification of the aortic valve. Aortic valve mean gradient measures 9.8 mmHg. Aortic valve peak gradient measures 16.7 mmHg. Aortic valve area, by VTI measures 0.72 cm. Venous: The inferior vena cava was not well visualized. Additional Comments: A device lead is visualized. LEFT VENTRICLE PLAX 2D LVIDd:         4.10 cm LVIDs:         3.60 cm LV PW:         0.70 cm LV IVS:        1.40 cm LVOT diam:     2.00 cm     3D Volume EF: LV SV:         35          3D EF:        53 % LV SV Index:   20          LV EDV:       100 ml LVOT Area:     3.14 cm    LV ESV:       47 ml                            LV SV:        53 ml  LV Volumes (MOD) LV vol d, MOD A2C: 52.6 ml LV vol d, MOD A4C: 57.2 ml LV vol s, MOD  A2C: 21.6 ml LV vol s, MOD A4C: 29.1 ml LV SV MOD A2C:     31.0 ml LV SV MOD A4C:     57.2 ml LV SV MOD BP:      32.3 ml RIGHT VENTRICLE RV Basal diam:  4.10 cm RV Mid diam:    3.70 cm RV S prime:     6.56 cm/s TAPSE (M-mode): 1.2 cm LEFT ATRIUM             Index        RIGHT ATRIUM           Index LA diam:        4.50 cm 2.59 cm/m   RA Area:     24.20 cm LA Vol (A2C):   92.7 ml 53.30 ml/m  RA Volume:   84.60 ml  48.64 ml/m LA Vol (A4C):   94.9 ml 54.56 ml/m LA Biplane Vol: 94.6 ml 54.39 ml/m  AORTIC VALVE AV Area (Vmax):    0.72 cm AV Area (Vmean):   0.69 cm AV Area (VTI):     0.72 cm AV Vmax:           204.25 cm/s AV Vmean:          145.500 cm/s AV VTI:            0.487 m AV Peak Grad:      16.7 mmHg AV Mean Grad:      9.8 mmHg LVOT Vmax:         46.80 cm/s LVOT Vmean:        31.875 cm/s LVOT VTI:          0.112 m LVOT/AV VTI ratio: 0.23  AORTA Ao Root diam: 3.60 cm Ao Asc diam:  3.00 cm TRICUSPID VALVE TR Peak grad:   33.4 mmHg TR Vmax:        289.00 cm/s  SHUNTS Systemic VTI:  0.11 m Systemic Diam: 2.00 cm Kaelynn Igo MD Electronically signed by Vernell Leep MD  Signature Date/Time: 12/27/2021/10:35:26 AM    Final (Updated)       FOLLOW UP PLANS AND APPOINTMENTS Discharge Instructions     (HEART FAILURE PATIENTS) Call MD:  Anytime you have any of the following symptoms: 1) 3 pound weight gain in 24 hours or 5 pounds in 1 week 2) shortness of breath, with or without a dry hacking cough 3) swelling in the hands, feet or stomach 4) if you have to sleep on extra pillows at night in order to breathe.   Complete by: As directed    Diet - low sodium heart healthy   Complete by: As directed    Increase activity slowly   Complete by: As directed       Allergies as of 12/28/2021       Reactions   Jardiance [empagliflozin] Nausea Only, Other (See Comments)   Made patient very sick to the stomach.   Ezetimibe Other (See Comments)   Myalgias  Other reaction(s): myalgias   Ace Inhibitors Other (See Comments)   Other reaction(s): cough   Aspirin Other (See Comments)   Other reaction(s): skin rash/easy bruising   Atorvastatin Other (See Comments)   Other reaction(s): weakness, fatigue (severe)   Ezetimibe-simvastatin Other (See Comments)   Other reaction(s): myalgias   Codeine Nausea And Vomiting     Other reaction(s): nausea   Unithroid [levothyroxine Sodium] Other (See Comments)   Caused blurry vision per pt        Medication  List     STOP taking these medications    carvedilol 3.125 MG tablet Commonly known as: COREG   furosemide 20 MG tablet Commonly known as: LASIX   furosemide 40 MG tablet Commonly known as: LASIX       TAKE these medications    acetaminophen 325 MG tablet Commonly known as: TYLENOL Take 2 tablets (650 mg total) by mouth every 6 (six) hours as needed for mild pain (or Fever >/= 101).   allopurinol 100 MG tablet Commonly known as: ZYLOPRIM Take 100 mg by mouth daily as needed (gout).   ALPRAZolam 0.5 MG tablet Commonly known as: XANAX Take 0.5 mg by mouth 3 (three) times daily as needed for anxiety or  sleep.   amiodarone 200 MG tablet Commonly known as: PACERONE Take 1 tablet (200 mg total) by mouth daily.   apixaban 5 MG Tabs tablet Commonly known as: ELIQUIS Take 1 tablet (5 mg total) by mouth 2 (two) times daily.   bisoprolol 5 MG tablet Commonly known as: ZEBETA Take 0.5 tablets (2.5 mg total) by mouth daily.   finasteride 5 MG tablet Commonly known as: PROSCAR Take 5 mg by mouth daily.   isosorbide mononitrate 30 MG 24 hr tablet Commonly known as: IMDUR Take 1 tablet (30 mg total) by mouth daily. Start taking on: December 29, 2021   levothyroxine 25 MCG tablet Commonly known as: SYNTHROID Take 25 mcg by mouth daily.   metoCLOPramide 5 MG tablet Commonly known as: REGLAN Take 1 tablet (5 mg total) by mouth every 6 (six) hours as needed for nausea.   nitroGLYCERIN 0.4 MG SL tablet Commonly known as: NITROSTAT Place 1 tablet (0.4 mg total) under the tongue every 5 (five) minutes as needed for chest pain.   ondansetron 4 MG disintegrating tablet Commonly known as: ZOFRAN-ODT Take 4 mg by mouth every 6 (six) hours as needed for nausea/vomiting.   pantoprazole 40 MG tablet Commonly known as: PROTONIX Take 1 tablet (40 mg total) by mouth daily.   polyethylene glycol 17 g packet Commonly known as: MIRALAX / GLYCOLAX Take 17 g by mouth 2 (two) times daily.   Repatha SureClick 412 MG/ML Soaj Generic drug: Evolocumab INJECT 1 PEN INTO THE SKIN EVERY 14 DAYS. What changed: See the new instructions.   senna-docusate 8.6-50 MG tablet Commonly known as: Senokot-S Take 1 tablet by mouth at bedtime.   spironolactone 25 MG tablet Commonly known as: ALDACTONE Take 0.5 tablets (12.5 mg total) by mouth daily.   tamsulosin 0.4 MG Caps capsule Commonly known as: FLOMAX Take 0.4 mg by mouth daily.   torsemide 20 MG tablet Commonly known as: DEMADEX Take 1 tablet (20 mg total) by mouth daily.        Follow-up Information     Nigel Mormon, MD Follow up on  01/02/2022.   Specialties: Cardiology, Radiology Why: 11:45 AM Contact information: [place] [id number] [phone]                   Nigel Mormon, MD Pager: [phone] Office: [phone]

## 2021-12-28 NOTE — Progress Notes (Signed)
OT Cancellation Note  Patient Details Name: Ryan [name] MRN: [id number] DOB: May 21, 1941   Cancelled Treatment:    Reason Eval/Treat Not Completed: OT screened, no needs identified, will sign off.  Spoke with PT.  Pt is back to baseline with no OT needs identified.  [name], OTR/L Acute Rehabilitation Services Pager [phone] Office [phone]   [name] 12/28/2021, 11:28 AM

## 2021-12-28 NOTE — TOC Transition Note (Signed)
Transition of Care [facility]) - CM/SW Discharge Note   Patient Details  Name: Ryan [name] MRN: [id number] Date of Birth: 12-24-1940  [facility] CM/SW Contact:  Carles Collet, RN Phone Number: 12/28/2021, 10:43 AM   Clinical Narrative:     Patient from home w wife, will DC to home. Patient followed by [facility] for CHF. No TOC needs identified for DC.  PCP Ginger Organ., MD Cardiology [name], [name], MD       Patient Goals and CMS Choice        Discharge Placement                       Discharge Plan and Services                                     Social Determinants of Health (SDOH) Interventions     Readmission Risk Interventions Readmission Risk Prevention Plan 11/19/2021  Transportation Screening Complete  PCP or Specialist Appt within 3-5 Days Complete  HRI or [place] Complete  Social Work Consult for [place] Planning/Counseling Complete  Palliative Care Screening Not Applicable  Medication Review (RN Care Manager) Complete  Some recent data might be hidden

## 2021-12-28 NOTE — Evaluation (Signed)
Physical Therapy Evaluation & Discharge Patient Details Name: Ryan [name] MRN: [id number] DOB: April 17, 1941 Today's Date: 12/28/2021  History of Present Illness  Pt is an 81 y.o. male who presented 12/25/21 chest pain. PMH: coronary artery disease status s/p CABG (patent LIMA-LAD, occluded SVG-ramus/OM Y graft), ischemic cardiomyopathy with recovered EF, s/p ICD, mod AS, persistent Afib, PAD s/o aortobifemoral bypass, mod carotid disease, h/o stroke, CKD III   Clinical Impression  Pt presents with condition above. Pt is at his baseline functionally, not displaying any significant balance deficits or weakness. He is able to mobilize safely without UE support, assistance, or LOB. He lives with his wife in a 2-level house with 1 STE. Educated pt on energy conservation techniques, CHF management (weighing self, limiting sodium etc), and hip exercises to address his R hip pain. All education completed and questions answered. PT will sign off.       Recommendations for follow up therapy are one component of a multi-disciplinary discharge planning process, led by the attending physician.  Recommendations may be updated based on patient status, additional functional criteria and insurance authorization.  Follow Up Recommendations No PT follow up    Assistance Recommended at Discharge None  Patient can return home with the following       Equipment Recommendations None recommended by PT  Recommendations for Other Services       Functional Status Assessment Patient has not had a recent decline in their functional status     Precautions / Restrictions Precautions Precautions: None Restrictions Weight Bearing Restrictions: No      Mobility  Bed Mobility               General bed mobility comments: Pt sitting up in chair upon arrival.    Transfers Overall transfer level: Independent Equipment used: None               General transfer comment: Able to come to stand in  appropriate amount of time without LOB or assist.    Ambulation/Gait Ambulation/Gait assistance: Modified independent (Device/Increase time) Gait Distance (Feet): 340 Feet Assistive device: None Gait Pattern/deviations: Step-through pattern;Decreased stride length;Wide base of support Gait velocity: reduced Gait velocity interpretation: 1.31 - 2.62 ft/sec, indicative of limited community ambulator   General Gait Details: Pt with slightly wide BOS and slow, but steady gait. Appears and reports to be at baseline, no LOB.  Stairs            Wheelchair Mobility    Modified Rankin (Stroke Patients Only)       Balance Overall balance assessment: No apparent balance deficits (not formally assessed) (reaches off BOS and ambulates without LOB or UE support)                                           Pertinent Vitals/Pain Pain Assessment: Faces Faces Pain Scale: Hurts a little bit Pain Location: R hip when walking (none when standing statically) Pain Descriptors / Indicators: Discomfort Pain Intervention(s): Monitored during session;Limited activity within patient's tolerance;Repositioned    Home Living Family/patient expects to be discharged to:: Private residence Living Arrangements: Spouse/significant other Available Help at Discharge: Family;Available 24 hours/day Type of Home: House Home Access: Stairs to enter Entrance Stairs-Rails: Can reach both Entrance Stairs-Number of Steps: 1 Alternate Level Stairs-Number of Steps: flight Home Layout: Two level;Able to live on main level with bedroom/bathroom Home Equipment: Rollator (  4 wheels);Cane - single point      Prior Function Prior Level of Function : Independent/Modified Independent;Driving             Mobility Comments: Does not use an AD. Denies any recent falls. ADLs Comments: Owns car wash businesses.     Hand Dominance        Extremity/Trunk Assessment   Upper Extremity  Assessment Upper Extremity Assessment: Overall WFL for tasks assessed    Lower Extremity Assessment Lower Extremity Assessment: Overall WFL for tasks assessed    Cervical / Trunk Assessment Cervical / Trunk Assessment: Kyphotic  Communication   Communication: HOH  Cognition Arousal/Alertness: Awake/alert Behavior During Therapy: WFL for tasks assessed/performed Overall Cognitive Status: Within Functional Limits for tasks assessed                                          General Comments General comments (skin integrity, edema, etc.): Educated pt on CHF management, energy conservation techniques, and 3-way hip exercises to hopefull improve R hip stability and thus pain    Exercises     Assessment/Plan    PT Assessment Patient does not need any further PT services  PT Problem List         PT Treatment Interventions      PT Goals (Current goals can be found in the Care Plan section)  Acute Rehab PT Goals Patient Stated Goal: to go home PT Goal Formulation: All assessment and education complete, DC therapy Time For Goal Achievement: 12/29/21 Potential to Achieve Goals: Good    Frequency       Co-evaluation               AM-PAC PT "6 Clicks" Mobility  Outcome Measure Help needed turning from your back to your side while in a flat bed without using bedrails?: None Help needed moving from lying on your back to sitting on the side of a flat bed without using bedrails?: None Help needed moving to and from a bed to a chair (including a wheelchair)?: None Help needed standing up from a chair using your arms (e.g., wheelchair or bedside chair)?: None Help needed to walk in hospital room?: None Help needed climbing 3-5 steps with a railing? : None 6 Click Score: 24    End of Session   Activity Tolerance: Patient tolerated treatment well Patient left: in chair;with call bell/phone within reach Nurse Communication: Mobility status PT Visit  Diagnosis: Other abnormalities of gait and mobility (R26.89)    Time: 1101-1116 PT Time Calculation (min) (ACUTE ONLY): 15 min   Charges:   PT Evaluation $PT Eval Low Complexity: 1 Low          Moishe Spice, PT, DPT Acute Rehabilitation Services  Pager: [phone] Office: [place] 12/28/2021, 11:31 AM

## 2021-12-29 ENCOUNTER — Telehealth: Payer: Self-pay

## 2021-12-29 NOTE — Telephone Encounter (Signed)
-----   Message from Nigel Mormon, MD sent at 12/28/2021 10:21 AM EST ----- Regarding: [facility] Discharge follow up: TOC: Needed Follow up appt: 1/13 11:45 AM w/me (Please schedule) Discharge diagnosis: HFrEF Discharge date: 1/9  Thanks MJP

## 2021-12-30 ENCOUNTER — Ambulatory Visit (HOSPITAL_COMMUNITY): Admission: RE | Admit: 2021-12-30 | Payer: Medicare Other | Source: Home / Self Care | Admitting: Cardiology

## 2021-12-30 ENCOUNTER — Encounter (HOSPITAL_COMMUNITY): Admission: RE | Payer: Self-pay | Source: Home / Self Care

## 2021-12-30 SURGERY — RIGHT/LEFT HEART CATH AND CORONARY ANGIOGRAPHY
Anesthesia: LOCAL

## 2021-12-31 ENCOUNTER — Ambulatory Visit (INDEPENDENT_AMBULATORY_CARE_PROVIDER_SITE_OTHER): Payer: Medicare Other

## 2021-12-31 ENCOUNTER — Other Ambulatory Visit (HOSPITAL_COMMUNITY): Payer: Self-pay | Admitting: Cardiology

## 2021-12-31 DIAGNOSIS — I472 Ventricular tachycardia, unspecified: Secondary | ICD-10-CM | POA: Diagnosis not present

## 2021-12-31 DIAGNOSIS — I5020 Unspecified systolic (congestive) heart failure: Secondary | ICD-10-CM | POA: Diagnosis not present

## 2021-12-31 DIAGNOSIS — I5032 Chronic diastolic (congestive) heart failure: Secondary | ICD-10-CM

## 2021-12-31 LAB — CUP PACEART REMOTE DEVICE CHECK
Battery Remaining Longevity: 174 mo
Battery Remaining Percentage: 100 %
Brady Statistic RV Percent Paced: 3 %
Date Time Interrogation Session: 20230111023200
HighPow Impedance: 68 Ohm
Implantable Lead Implant Date: 20220110
Implantable Lead Location: 753860
Implantable Lead Model: 137
Implantable Lead Serial Number: 301176
Implantable Pulse Generator Implant Date: 20220110
Lead Channel Impedance Value: 496 Ohm
Lead Channel Setting Pacing Amplitude: 2.5 V
Lead Channel Setting Pacing Pulse Width: 0.4 ms
Lead Channel Setting Sensing Sensitivity: 0.5 mV
Pulse Gen Serial Number: 211464

## 2021-12-31 NOTE — Progress Notes (Signed)
Patient referred by Ginger Organ., MD for chest pain, shortness of breath  Subjective:   Ryan [name], male    DOB: 1941/06/08, 81 y.o.   MRN: [id number]   Chief Complaint  Patient presents with   Congestive Heart Failure   Hospitalization Follow-up     HPI  81 year old caucasian male with coronary artery disease status s/p CABG (patent LIMA-LAD, occluded SVG-ramus/OM Y graft), ischemic cardiomyopathy with recovered EF, s/p ICD, mod AS, persistent Afib, PAD s/o aortobifemoral bypass, mod carotid disease, h/o stroke, CKD III   Hospital discharge summary 12/28/2021: Patient was admitted with worsening chest pain and exertional dyspnea.  He was found to be in mild decompensation of HFpEF.  He was diuresed with IV Lasix with significant improvement in his dyspnea.  He was also started on Imdur with improvement in chest pain.  Creatinine decreased with diuresis, but increased again on 12/28/2018 23-1.5.  However, he is diuresing very well-unfortunately not all accounted for due to misunderstanding on patient's part regarding urine collection.  However, given clinical improvement, I think it is reasonable to discharge him today with close follow-up of his creatinine in the coming week.  Changed diuretics to p.o. torsemide 20 mg daily starting 12/29/2021.  If creatinine remains >1.5, will need to reduce his Eliquis dose to 2.5 mg twice daily.  Patient is here with his wife today for follow-up.  He has been doing very well since his hospital discharge, and states that he "felt the best he has been in a long time".  He went to [facility] yesterday and walk without any significant chest pain or shortness of breath.  However, he had an episode of sudden onset dizziness, with spinning sensation, associated with ringing in his ears around 2 PM yesterday afternoon.  Symptoms have subsided now.  Current Outpatient Medications on File Prior to Visit  Medication Sig Dispense Refill   acetaminophen  (TYLENOL) 325 MG tablet Take 2 tablets (650 mg total) by mouth every 6 (six) hours as needed for mild pain (or Fever >/= 101). 20 tablet 0   allopurinol (ZYLOPRIM) 100 MG tablet Take 100 mg by mouth daily as needed (gout).     ALPRAZolam (XANAX) 0.5 MG tablet Take 0.5 mg by mouth 3 (three) times daily as needed for anxiety or sleep.     amiodarone (PACERONE) 200 MG tablet Take 1 tablet (200 mg total) by mouth daily. 90 tablet 1   apixaban (ELIQUIS) 5 MG TABS tablet Take 1 tablet (5 mg total) by mouth 2 (two) times daily. 180 tablet 3   bisoprolol (ZEBETA) 5 MG tablet Take 0.5 tablets (2.5 mg total) by mouth daily. 45 tablet 3   finasteride (PROSCAR) 5 MG tablet Take 5 mg by mouth daily.     isosorbide mononitrate (IMDUR) 30 MG 24 hr tablet Take 1 tablet (30 mg total) by mouth daily. 30 tablet 2   levothyroxine (SYNTHROID) 25 MCG tablet Take 25 mcg by mouth daily.     metoCLOPramide (REGLAN) 5 MG tablet Take 1 tablet (5 mg total) by mouth every 6 (six) hours as needed for nausea. 15 tablet 0   nitroGLYCERIN (NITROSTAT) 0.4 MG SL tablet Place 1 tablet (0.4 mg total) under the tongue every 5 (five) minutes as needed for chest pain. 25 tablet 3   ondansetron (ZOFRAN-ODT) 4 MG disintegrating tablet Take 4 mg by mouth every 6 (six) hours as needed for nausea/vomiting.     pantoprazole (PROTONIX) 40 MG tablet  Take 1 tablet (40 mg total) by mouth daily. 30 tablet 11   polyethylene glycol (MIRALAX / GLYCOLAX) 17 g packet Take 17 g by mouth 2 (two) times daily.     REPATHA SURECLICK 867 MG/ML SOAJ INJECT 1 PEN INTO THE SKIN EVERY 14 DAYS. (Patient taking differently: Inject 140 mg into the skin every 14 (fourteen) days.) 2 mL 11   senna-docusate (SENOKOT-S) 8.6-50 MG tablet Take 1 tablet by mouth at bedtime. 30 tablet 0   spironolactone (ALDACTONE) 25 MG tablet Take 0.5 tablets (12.5 mg total) by mouth daily. 45 tablet 3   tamsulosin (FLOMAX) 0.4 MG CAPS capsule Take 0.4 mg by mouth daily.     torsemide  (DEMADEX) 20 MG tablet Take 1 tablet (20 mg total) by mouth daily. 30 tablet 3   No current facility-administered medications on file prior to visit.    Cardiovascular and other pertinent studies:  EKG 12/25/2021: Atypical atrial flutter 60 bpm RSR(V1) -nondiagnostic Nonspecific T-abnormality  Echocardiogram 12/27/2021:  1. Left ventricular ejection fraction, by estimation, is 50 to 55%. The  left ventricle has low normal function. The left ventricle demonstrates  regional wall motion abnormalities (see scoring diagram/findings for  description). Diastolic function not  assessed due to Afib. There is severe hypokinesis of the left ventricular,  basal anteroseptal wall.   2. Right ventricular systolic function is mildly reduced. The right  ventricular size is normal. There is mildly elevated pulmonary artery  systolic pressure.   3. Left atrial size was severely dilated.   4. Right atrial size was moderately dilated.   5. The mitral valve is grossly normal. Mild mitral valve regurgitation.   6. Mild tricuspid regurgitation. peak RA-RV gradient 33 mmHg. . Tricuspid  valve regurgitation is mild.   7. Vmax 2.3 m/sec, mean PG 13 mmHg. AVA by continuity equation is 0.9  cm2, with dimensionless index of 0.24. Small LV cavity with LV SV index of  20 cc/m2. Likely paradoxically low flow low gradient severe aortic  stenosis. . The aortic valve is  tricuspid. There is moderate calcification of the aortic valve.   8. Overall, LVEF assessment remains difficult. No significant change  compared to previous study in 08/2021.   Coronary and bypass graft angiography 01/2019: Severe native coronary artery disease with diffuse proximal to mid LAD stenosis with diffusely diseased native LAD proximally and mid with competitive filling from the LIMA graft; 90% proximal stenosis in the ramus immediate vessel, diffuse stenosis of 60 to 70% in the proximal circumflex with diffuse 80% distal stenosis and total  occlusion of the AV groove after marginal vessel with total occlusion of the proximal native RCA.  There is extensive collateralization to the distal RCA both via the circumflex vessel as well as via the LIMA to LAD and distal LAD.   Patent LIMA graft supplying the mid LAD   Previously documented old occlusion of the previous Y graft which had supplied the ramus and marginal vessel in the vein graft which had supplied the PDA.   Low right heart pressures with mean PA pressure at 19 mmHg.   RECOMMENDATION: Increase medical therapy.  The patient has only been on low-dose anti-ischemic medical regimen consisting of bisoprolol 2.5 mg as well as isosorbide 60 mg.  Recommend initiation of amlodipine 5 mg.  Also recommend consideration of Ranexa 500 mg twice a day.  The patient is on rosuvastatin.  However if the patient is to be on atorvastatin, the maximum recommended dose of atorvastatin is 40  mg if the patient is on ranolazine.   Echocardiogram 08/28/2021: 1. Left ventricular ejection fraction, by estimation, is 50 to 55%. The left ventricle has low normal function. The left ventricle demonstrates regional wall motion abnormalities. Basal anterior/anteroseptal hypokinesis. There is severe asymmetric left ventricular hypertrophy of the basal-septal segment. Left ventricular diastolic parameters are indeterminate. 2. Right ventricular systolic function is moderately reduced. The right ventricular size is normal. There is normal pulmonary artery systolic pressure. 3. The mitral valve is normal in structure. Trivial mitral valve regurgitation. No evidence of mitral stenosis. 4. The inferior vena cava is normal in size with greater than 50% respiratory variability, suggesting right atrial pressure of 3 mmHg. 5. The aortic valve is calcified. There is moderate calcification of the aortic valve. Aortic valve regurgitation is not visualized. Moderate aortic valve stenosis. Mean gradient only 58mHg, but AVA 1.0  cm^2 and DI 0.35, suspect paradoxical low flow low gradient moderate AS given low SV index (21cc/m^2) 6. Small mobile echodensity adjacent to RV pacemaker lead (seen on image 14), could represent fibrinous strand or small adherent thrombus; if clinical concern for infection would check blood cultures   Recent labs: 12/28/2021: Glucose 91, BUN/Cr 21/1.54. EGFR 45. Na/K 138/3.7.  H/H 13/40. MCV 97. Platelets 258   Latest Reference Range & Units 12/23/21 14:57 12/25/21 15:11 12/25/21 20:21 12/26/21 12:03 12/26/21 17:54  B Natriuretic Peptide 0.0 - 100.0 pg/mL 1,115.7 (H) 864.7 (H)     Troponin I (High Sensitivity) <18 ng/L 42 (H) 41 (H) 41 (H) 41 (H) 62 (H)  (H): Data is abnormally high  12/23/2021: Glucose 106, BUN/Cr 17/1.48. EGFR 48. Na/K 138/4.1. Rest of the CMP normal H/H 14/46. MCV 100. Platelets 279  08/2021: HbA1C 6.0% Chol 99, TG 72, HDL 39, LDL 45 TSH 5.0 normal   Latest Reference Range & Units 12/15/21 01:01 12/15/21 03:14 12/23/21 13:04 12/23/21 14:57  B Natriuretic Peptide 0.0 - 100.0 pg/mL 1,878.1 (H)   1,115.7 (H)  Troponin I (High Sensitivity) <18 ng/L 57 (H) 51 (H) 45 (H) 42 (H)  (H): Data is abnormally high    Review of Systems  Cardiovascular:  Negative for chest pain, dyspnea on exertion, leg swelling, palpitations and syncope.  Respiratory:  Negative for shortness of breath.   Genitourinary:        Self catheterization at home due to urinary retention  Neurological:  Positive for dizziness.        Vitals:   01/01/22 0954  BP: (!) 116/59  Pulse: (!) 53  Resp: 16  Temp: 98 F (36.7 C)  SpO2: 97%     Body mass index is 22.92 kg/m. Filed Weights   01/01/22 0954  Weight: 142 lb (64.4 kg)     Objective:   Physical Exam Vitals and nursing note reviewed.  Constitutional:      General: He is not in acute distress. Neck:     Vascular: JVD present.  Cardiovascular:     Rate and Rhythm: Normal rate. Rhythm irregular.     Pulses:          Dorsalis  pedis pulses are 0 on the right side and 0 on the left side.       Posterior tibial pulses are 0 on the right side and 1+ on the left side.     Heart sounds: Murmur heard.  Harsh midsystolic murmur is present with a grade of 2/6 at the upper right sternal border radiating to the neck.  Pulmonary:     Effort:  Pulmonary effort is normal.     Breath sounds: Normal breath sounds. No wheezing or rales.  Musculoskeletal:     Right lower leg: No edema.     Left lower leg: No edema.       Assessment & Recommendations:   81 year old caucasian male with coronary artery disease status s/p CABG (patent LIMA-LAD, occluded SVG-ramus/OM Y graft), ischemic cardiomyopathy with recovered EF, s/p ICD, mod AS, persistent Afib, PAD s/o aortobifemoral bypass, mod carotid disease, h/o stroke, CKD III  Dizziness: Most likely vertigo, given his spinning sensation and right ear tinnitus.  Prescribed meclizine until his follow-up with PCP.  Chronic HFpEF: Likely combination of ischemic cardiomyopathy, also component of mild RV systolic dysfunction. He possibly also has paradoxical low flow low gradient aortic stenosis, which may also be contributing. He has done with medical therapy with torsemide as preferred diuretic. Continue spironolactone 12.5 mg daily, bisoprolol 2.5 mg daily. If symptoms stay stable, would like to avoid heart catheterization in light of his renal dysfunction.    CAD: Suspected mild chronic ischemia at baseline, with mildly elevated but flat troponins. Continue aspirin 81 mg, bisoprolol 2.5 mg, Imdur 30 mg, Repatha. Angina symptoms have improved on Imdur.  Continue medical management for now.  Persistent A. fib/atrial flutter: Longstanding.  Rate controlled. High CHA2DS2VAsc score Continue Eliquis 5 mg daily.     H/o NSVT: Continue amiodarone 200 mg daily.   CKD stage III: Labs pending.   PAD: S/p aortobifem bypass.  No critical limb ischemia.   F/u in 3-4 weeks   Nigel Mormon, MD Pager: [phone] Office: [phone]

## 2022-01-01 ENCOUNTER — Other Ambulatory Visit: Payer: Self-pay

## 2022-01-01 ENCOUNTER — Other Ambulatory Visit: Payer: Self-pay | Admitting: *Deleted

## 2022-01-01 ENCOUNTER — Ambulatory Visit: Payer: Medicare Other | Admitting: Cardiology

## 2022-01-01 ENCOUNTER — Encounter: Payer: Self-pay | Admitting: Cardiology

## 2022-01-01 VITALS — BP 116/59 | HR 53 | Temp 98.0°F | Resp 16 | Ht 66.0 in | Wt 142.0 lb

## 2022-01-01 DIAGNOSIS — I4819 Other persistent atrial fibrillation: Secondary | ICD-10-CM

## 2022-01-01 DIAGNOSIS — R42 Dizziness and giddiness: Secondary | ICD-10-CM

## 2022-01-01 DIAGNOSIS — I5032 Chronic diastolic (congestive) heart failure: Secondary | ICD-10-CM

## 2022-01-01 DIAGNOSIS — I350 Nonrheumatic aortic (valve) stenosis: Secondary | ICD-10-CM | POA: Diagnosis not present

## 2022-01-01 DIAGNOSIS — I25118 Atherosclerotic heart disease of native coronary artery with other forms of angina pectoris: Secondary | ICD-10-CM | POA: Diagnosis not present

## 2022-01-01 DIAGNOSIS — I5020 Unspecified systolic (congestive) heart failure: Secondary | ICD-10-CM

## 2022-01-01 DIAGNOSIS — I2510 Atherosclerotic heart disease of native coronary artery without angina pectoris: Secondary | ICD-10-CM

## 2022-01-01 LAB — BASIC METABOLIC PANEL
BUN/Creatinine Ratio: 16 (ref 10–24)
BUN: 29 mg/dL — ABNORMAL HIGH (ref 8–27)
CO2: 27 mmol/L (ref 20–29)
Calcium: 9.7 mg/dL (ref 8.6–10.2)
Chloride: 98 mmol/L (ref 96–106)
Creatinine, Ser: 1.83 mg/dL — ABNORMAL HIGH (ref 0.76–1.27)
Glucose: 110 mg/dL — ABNORMAL HIGH (ref 70–99)
Potassium: 4.8 mmol/L (ref 3.5–5.2)
Sodium: 141 mmol/L (ref 134–144)
eGFR: 37 mL/min/{1.73_m2} — ABNORMAL LOW (ref >59)

## 2022-01-01 LAB — BRAIN NATRIURETIC PEPTIDE: BNP: 592 pg/mL — ABNORMAL HIGH (ref 0.0–100.0)

## 2022-01-01 MED ORDER — MECLIZINE HCL 12.5 MG PO TABS: 12.5000 mg | ORAL_TABLET | Freq: Three times a day (TID) | ORAL | 1 refills | Status: DC | PRN

## 2022-01-01 NOTE — Patient Outreach (Signed)
[facility]) Care Management  01/01/2022  [name] 1941/05/30 [id number]   Telephone Assessment-Unsuccessful  RN attempted outreach call today however unsuccessful. RN able to leave a HIPAA approved voice message requesting a call back.  Will attempt another outreach call over the next week for ongoing [facility] services.  [name], RN Care Management Coordinator [place] Office [phone]

## 2022-01-02 NOTE — Addendum Note (Signed)
Addended by: Nigel Mormon on: 01/02/2022 10:04 AM   Modules accepted: Orders

## 2022-01-02 NOTE — Progress Notes (Signed)
Reviewed the labs.  Call patient, who was not home.  I spoke with his wife.  Creatinine has increased from 1.5-1.8, while BNP has come down from 800-500.  This is expected increase in creatinine with aggressive diuresis.  I recommend holding torsemide today and tomorrow and resume it on Sunday, 01/04/2022.  Repeat BMP on 01/07/2022.  Keep follow-up with me on 01/23/2022.

## 2022-01-06 ENCOUNTER — Other Ambulatory Visit: Payer: Self-pay | Admitting: *Deleted

## 2022-01-06 NOTE — Patient Outreach (Signed)
[facility]) Care Management  [place]  01/06/2022   Ryan [name] 08/31/1941 [id number]  Telephone Assessment-Successful Spoke with pt's spouse [name]) today for update on pt's ongoing management of care. Notes documented in the plan of care concerning pt's ongoing adherence and recent changes.   Encounter Medications:  Outpatient Encounter Medications as of 01/06/2022  Medication Sig   acetaminophen (TYLENOL) 325 MG tablet Take 2 tablets (650 mg total) by mouth every 6 (six) hours as needed for mild pain (or Fever >/= 101).   allopurinol (ZYLOPRIM) 100 MG tablet Take 100 mg by mouth daily as needed (gout).   ALPRAZolam (XANAX) 0.5 MG tablet Take 0.5 mg by mouth 3 (three) times daily as needed for anxiety or sleep.   amiodarone (PACERONE) 200 MG tablet Take 1 tablet (200 mg total) by mouth daily.   apixaban (ELIQUIS) 5 MG TABS tablet Take 1 tablet (5 mg total) by mouth 2 (two) times daily.   bisoprolol (ZEBETA) 5 MG tablet Take 0.5 tablets (2.5 mg total) by mouth daily.   finasteride (PROSCAR) 5 MG tablet Take 5 mg by mouth daily.   levothyroxine (SYNTHROID) 25 MCG tablet Take 25 mcg by mouth daily.   meclizine (ANTIVERT) 12.5 MG tablet Take 1 tablet (12.5 mg total) by mouth 3 (three) times daily as needed for dizziness.   metoCLOPramide (REGLAN) 5 MG tablet Take 1 tablet (5 mg total) by mouth every 6 (six) hours as needed for nausea.   nitroGLYCERIN (NITROSTAT) 0.4 MG SL tablet Place 1 tablet (0.4 mg total) under the tongue every 5 (five) minutes as needed for chest pain.   ondansetron (ZOFRAN-ODT) 4 MG disintegrating tablet Take 4 mg by mouth every 6 (six) hours as needed for nausea/vomiting.   pantoprazole (PROTONIX) 40 MG tablet Take 1 tablet (40 mg total) by mouth daily.   polyethylene glycol (MIRALAX / GLYCOLAX) 17 g packet Take 17 g by mouth 2 (two) times daily.   REPATHA SURECLICK 026 MG/ML SOAJ INJECT 1 PEN INTO THE SKIN EVERY 14 DAYS. (Patient  taking differently: Inject 140 mg into the skin every 14 (fourteen) days.)   senna-docusate (SENOKOT-S) 8.6-50 MG tablet Take 1 tablet by mouth at bedtime.   spironolactone (ALDACTONE) 25 MG tablet Take 0.5 tablets (12.5 mg total) by mouth daily.   tamsulosin (FLOMAX) 0.4 MG CAPS capsule Take 0.4 mg by mouth daily.   torsemide (DEMADEX) 20 MG tablet Take 1 tablet (20 mg total) by mouth daily.   isosorbide mononitrate (IMDUR) 30 MG 24 hr tablet Take 1 tablet (30 mg total) by mouth daily.   No facility-administered encounter medications on file as of 01/06/2022.    Functional Status:  In your present state of health, do you have any difficulty performing the following activities: 12/26/2021 11/26/2021  Hearing? N N  Vision? N N  Difficulty concentrating or making decisions? N N  Walking or climbing stairs? Y N  Dressing or bathing? N N  Doing errands, shopping? N N  Preparing Food and eating ? - N  Using the Toilet? - N  In the past six months, have you accidently leaked urine? - N  Do you have problems with loss of bowel control? - N  Managing your Medications? - N  Managing your Finances? - N  Housekeeping or managing your Housekeeping? - N  Some recent data might be hidden    Fall/Depression Screening: Fall Risk  11/26/2021  Falls in the past year? 0  Follow up Falls  prevention discussed;Education provided   PHQ 2/9 Scores 11/26/2021  PHQ - 2 Score 0    Assessment:   Care Plan Care Plan : RN Care Manger Plan of Care  Updates made by Tobi Bastos, RN since 01/06/2022 12:00 AM     Problem: Knowledge Deficit related to CHF and care coordination needs   Priority: High     Long-Range Goal: Development of plan of care formanagement of CHF. and care coordination needs.   Start Date: 11/26/2021  Expected End Date: 06/19/2022  This Visit's Progress: On track  Priority: High  Note:   Current Barriers:  Knowledge Deficits related to plan of care for management of CHF   RNCM  Clinical Goal(s):  Patient will verbalize understanding of plan for management of CHF as evidenced by responding to teach-back information that was educated. verbalize basic understanding of  CHF disease process and self health management plan as evidenced by seld awareness of the HF zones and what to do if acute symptom occur. take all medications exactly as prescribed and will call provider for medication related questions as evidenced by refilling all medications  attend all scheduled medical appointments: 12/15 as evidenced by Discussed  through collaboration with RN Care manager, provider, and care team.   Interventions: Inter-disciplinary care team collaboration (see longitudinal plan of care) Evaluation of current treatment plan related to  self management and patient's adherence to plan as established by provider Update 1/17-Spoke wit spouse [name]) who provided update on pt's ongoing care. Reports daily weights at baseline at 140-141 lbs however ongoing history of left ankle swelling at a minimal. No other relates symptoms. Encouraged pt to elevate LE and use compression stocking that he has available. Pt will follow up with CAD provider on Friday. Note recent change in medication from Lasix to Torsemide and pt is consuming there requested 3-4 bottles of H2O.  Heart Failure Interventions:  (Status:  Goal on track:  Yes.) Long Term Goal Basic overview and discussion of pathophysiology of Heart Failure reviewed Advised patient to weigh each morning after emptying bladder Discussed importance of daily weight and advised patient to weigh and record daily Reviewed role of diuretics in prevention of fluid overload and management of heart failure; Discussed the importance of keeping all appointments with provider  Patient Goals/Self-Care Activities: Take all medications as prescribed Attend all scheduled provider appointments Perform all self care activities independently  Perform IADL's  (shopping, preparing meals, housekeeping, managing finances) independently Call provider office for new concerns or questions   Follow Up Plan:  Telephone follow up appointment with care management team member scheduled for:  Feb 2023 The patient has been provided with contact information for the care management team and has been advised to call with any health related questions or concerns.       [name], RN Care Management Coordinator [place] Office [phone]

## 2022-01-07 ENCOUNTER — Other Ambulatory Visit (HOSPITAL_COMMUNITY): Payer: Self-pay | Admitting: Cardiology

## 2022-01-07 ENCOUNTER — Ambulatory Visit: Payer: Medicare Other | Admitting: Cardiology

## 2022-01-07 DIAGNOSIS — I5020 Unspecified systolic (congestive) heart failure: Secondary | ICD-10-CM | POA: Diagnosis not present

## 2022-01-08 LAB — BASIC METABOLIC PANEL
BUN/Creatinine Ratio: 16 (ref 10–24)
BUN: 23 mg/dL (ref 8–27)
CO2: 24 mmol/L (ref 20–29)
Calcium: 9.4 mg/dL (ref 8.6–10.2)
Chloride: 97 mmol/L (ref 96–106)
Creatinine, Ser: 1.41 mg/dL — ABNORMAL HIGH (ref 0.76–1.27)
Glucose: 102 mg/dL — ABNORMAL HIGH (ref 70–99)
Potassium: 4.9 mmol/L (ref 3.5–5.2)
Sodium: 137 mmol/L (ref 134–144)
eGFR: 50 mL/min/{1.73_m2} — ABNORMAL LOW (ref >59)

## 2022-01-08 LAB — BRAIN NATRIURETIC PEPTIDE: BNP: 599.2 pg/mL — ABNORMAL HIGH (ref 0.0–100.0)

## 2022-01-09 NOTE — Progress Notes (Signed)
Called pt no answer, left a vm

## 2022-01-09 NOTE — Progress Notes (Signed)
Please tell the patient that kidney function has improved. Recommend taking torsemide 20 mg once daily. I will see him in a few days for his appt.  Thanks MJP

## 2022-01-09 NOTE — Progress Notes (Signed)
Remote ICD transmission.   

## 2022-01-09 NOTE — Progress Notes (Signed)
Pt called back, pt aware.

## 2022-01-18 DIAGNOSIS — I130 Hypertensive heart and chronic kidney disease with heart failure and stage 1 through stage 4 chronic kidney disease, or unspecified chronic kidney disease: Secondary | ICD-10-CM | POA: Diagnosis not present

## 2022-01-18 DIAGNOSIS — N1831 Chronic kidney disease, stage 3a: Secondary | ICD-10-CM | POA: Diagnosis not present

## 2022-01-18 DIAGNOSIS — E785 Hyperlipidemia, unspecified: Secondary | ICD-10-CM | POA: Diagnosis not present

## 2022-01-18 DIAGNOSIS — I5022 Chronic systolic (congestive) heart failure: Secondary | ICD-10-CM | POA: Diagnosis not present

## 2022-01-20 ENCOUNTER — Ambulatory Visit: Payer: Medicare Other | Admitting: Cardiovascular Disease

## 2022-01-21 DIAGNOSIS — E039 Hypothyroidism, unspecified: Secondary | ICD-10-CM | POA: Diagnosis not present

## 2022-01-21 DIAGNOSIS — M7021 Olecranon bursitis, right elbow: Secondary | ICD-10-CM | POA: Diagnosis not present

## 2022-01-23 ENCOUNTER — Encounter: Payer: Self-pay | Admitting: Cardiology

## 2022-01-23 ENCOUNTER — Ambulatory Visit: Payer: Medicare Other | Admitting: Cardiology

## 2022-01-23 ENCOUNTER — Other Ambulatory Visit: Payer: Self-pay

## 2022-01-23 VITALS — BP 137/68 | HR 56 | Temp 97.5°F | Resp 16 | Ht 66.0 in | Wt 140.0 lb

## 2022-01-23 DIAGNOSIS — I4819 Other persistent atrial fibrillation: Secondary | ICD-10-CM | POA: Diagnosis not present

## 2022-01-23 DIAGNOSIS — I25810 Atherosclerosis of coronary artery bypass graft(s) without angina pectoris: Secondary | ICD-10-CM | POA: Diagnosis not present

## 2022-01-23 DIAGNOSIS — I25118 Atherosclerotic heart disease of native coronary artery with other forms of angina pectoris: Secondary | ICD-10-CM | POA: Diagnosis not present

## 2022-01-23 DIAGNOSIS — I5032 Chronic diastolic (congestive) heart failure: Secondary | ICD-10-CM

## 2022-01-23 MED ORDER — TORSEMIDE 20 MG PO TABS: 20.0000 mg | ORAL_TABLET | Freq: Every day | ORAL | 3 refills | Status: DC

## 2022-01-23 NOTE — Progress Notes (Signed)
Patient referred by Ginger Organ., MD for chest pain, shortness of breath  Subjective:   Ryan [name], male    DOB: 1941/01/19, 81 y.o.   MRN: [id number]   Chief Complaint  Patient presents with   Congestive Heart Failure   Follow-up    4 week     HPI  81 year old caucasian male with coronary artery disease status s/p CABG (patent LIMA-LAD, occluded SVG-ramus/OM Y graft), ischemic cardiomyopathy with recovered EF, s/p ICD, mod AS, persistent Afib, PAD s/o aortobifemoral bypass, mod carotid disease, h/o stroke, CKD III   Patient is doing well.  He is walking regularly without any significant chest pain shortness of breath.  Leg swelling is completely resolved.  Only time he "feels bad", is when he takes his thyroid medication.  Current Outpatient Medications on File Prior to Visit  Medication Sig Dispense Refill   acetaminophen (TYLENOL) 325 MG tablet Take 2 tablets (650 mg total) by mouth every 6 (six) hours as needed for mild pain (or Fever >/= 101). 20 tablet 0   allopurinol (ZYLOPRIM) 100 MG tablet Take 100 mg by mouth daily as needed (gout).     ALPRAZolam (XANAX) 0.5 MG tablet Take 0.5 mg by mouth 3 (three) times daily as needed for anxiety or sleep.     amiodarone (PACERONE) 200 MG tablet Take 1 tablet (200 mg total) by mouth daily. 90 tablet 1   apixaban (ELIQUIS) 5 MG TABS tablet Take 1 tablet (5 mg total) by mouth 2 (two) times daily. 180 tablet 3   bisoprolol (ZEBETA) 5 MG tablet Take 0.5 tablets (2.5 mg total) by mouth daily. 45 tablet 3   finasteride (PROSCAR) 5 MG tablet Take 5 mg by mouth daily.     isosorbide mononitrate (IMDUR) 30 MG 24 hr tablet Take 1 tablet (30 mg total) by mouth daily. 30 tablet 2   isosorbide mononitrate (IMDUR) 30 MG 24 hr tablet 1 tablet in the morning     levothyroxine (SYNTHROID) 25 MCG tablet Take 25 mcg by mouth daily.     meclizine (ANTIVERT) 12.5 MG tablet Take 1 tablet (12.5 mg total) by mouth 3 (three) times daily as  needed for dizziness. 60 tablet 1   metoCLOPramide (REGLAN) 5 MG tablet Take 1 tablet (5 mg total) by mouth every 6 (six) hours as needed for nausea. 15 tablet 0   nitroGLYCERIN (NITROSTAT) 0.4 MG SL tablet Place 1 tablet (0.4 mg total) under the tongue every 5 (five) minutes as needed for chest pain. 25 tablet 3   ondansetron (ZOFRAN-ODT) 4 MG disintegrating tablet Take 4 mg by mouth every 6 (six) hours as needed for nausea/vomiting.     pantoprazole (PROTONIX) 40 MG tablet Take 1 tablet (40 mg total) by mouth daily. 30 tablet 11   polyethylene glycol (MIRALAX / GLYCOLAX) 17 g packet Take 17 g by mouth 2 (two) times daily.     REPATHA SURECLICK 779 MG/ML SOAJ INJECT 1 PEN INTO THE SKIN EVERY 14 DAYS. (Patient taking differently: Inject 140 mg into the skin every 14 (fourteen) days.) 2 mL 11   senna-docusate (SENOKOT-S) 8.6-50 MG tablet Take 1 tablet by mouth at bedtime. 30 tablet 0   spironolactone (ALDACTONE) 25 MG tablet Take 0.5 tablets (12.5 mg total) by mouth daily. 45 tablet 3   tamsulosin (FLOMAX) 0.4 MG CAPS capsule Take 0.4 mg by mouth daily.     torsemide (DEMADEX) 20 MG tablet Take 1 tablet (20 mg total) by  mouth daily. 30 tablet 3   No current facility-administered medications on file prior to visit.    Cardiovascular and other pertinent studies:  EKG 12/25/2021: Atypical atrial flutter 60 bpm RSR(V1) -nondiagnostic Nonspecific T-abnormality  Echocardiogram 12/27/2021:  1. Left ventricular ejection fraction, by estimation, is 50 to 55%. The  left ventricle has low normal function. The left ventricle demonstrates  regional wall motion abnormalities (see scoring diagram/findings for  description). Diastolic function not  assessed due to Afib. There is severe hypokinesis of the left ventricular,  basal anteroseptal wall.   2. Right ventricular systolic function is mildly reduced. The right  ventricular size is normal. There is mildly elevated pulmonary artery  systolic pressure.    3. Left atrial size was severely dilated.   4. Right atrial size was moderately dilated.   5. The mitral valve is grossly normal. Mild mitral valve regurgitation.   6. Mild tricuspid regurgitation. peak RA-RV gradient 33 mmHg. . Tricuspid  valve regurgitation is mild.   7. Vmax 2.3 m/sec, mean PG 13 mmHg. AVA by continuity equation is 0.9  cm2, with dimensionless index of 0.24. Small LV cavity with LV SV index of  20 cc/m2. Likely paradoxically low flow low gradient severe aortic  stenosis. . The aortic valve is  tricuspid. There is moderate calcification of the aortic valve.   8. Overall, LVEF assessment remains difficult. No significant change  compared to previous study in 08/2021.   Coronary and bypass graft angiography 01/2019: Severe native coronary artery disease with diffuse proximal to mid LAD stenosis with diffusely diseased native LAD proximally and mid with competitive filling from the LIMA graft; 90% proximal stenosis in the ramus immediate vessel, diffuse stenosis of 60 to 70% in the proximal circumflex with diffuse 80% distal stenosis and total occlusion of the AV groove after marginal vessel with total occlusion of the proximal native RCA.  There is extensive collateralization to the distal RCA both via the circumflex vessel as well as via the LIMA to LAD and distal LAD.   Patent LIMA graft supplying the mid LAD   Previously documented old occlusion of the previous Y graft which had supplied the ramus and marginal vessel in the vein graft which had supplied the PDA.   Low right heart pressures with mean PA pressure at 19 mmHg.   RECOMMENDATION: Increase medical therapy.  The patient has only been on low-dose anti-ischemic medical regimen consisting of bisoprolol 2.5 mg as well as isosorbide 60 mg.  Recommend initiation of amlodipine 5 mg.  Also recommend consideration of Ranexa 500 mg twice a day.  The patient is on rosuvastatin.  However if the patient is to be on  atorvastatin, the maximum recommended dose of atorvastatin is 40 mg if the patient is on ranolazine.   Echocardiogram 08/28/2021: 1. Left ventricular ejection fraction, by estimation, is 50 to 55%. The left ventricle has low normal function. The left ventricle demonstrates regional wall motion abnormalities. Basal anterior/anteroseptal hypokinesis. There is severe asymmetric left ventricular hypertrophy of the basal-septal segment. Left ventricular diastolic parameters are indeterminate. 2. Right ventricular systolic function is moderately reduced. The right ventricular size is normal. There is normal pulmonary artery systolic pressure. 3. The mitral valve is normal in structure. Trivial mitral valve regurgitation. No evidence of mitral stenosis. 4. The inferior vena cava is normal in size with greater than 50% respiratory variability, suggesting right atrial pressure of 3 mmHg. 5. The aortic valve is calcified. There is moderate calcification of the aortic valve.  Aortic valve regurgitation is not visualized. Moderate aortic valve stenosis. Mean gradient only 18mHg, but AVA 1.0 cm^2 and DI 0.35, suspect paradoxical low flow low gradient moderate AS given low SV index (21cc/m^2) 6. Small mobile echodensity adjacent to RV pacemaker lead (seen on image 14), could represent fibrinous strand or small adherent thrombus; if clinical concern for infection would check blood cultures   Recent labs: 01/07/2022: Glucose 102, BUN/Cr 23/1.41. EGFR 50. Na/K 137/4.9.  BNP 599  12/31/2021: BNP 592  12/28/2021: Glucose 91, BUN/Cr 21/1.54. EGFR 45. Na/K 138/3.7.  H/H 13/40. MCV 97. Platelets 258   Latest Reference Range & Units 12/23/21 14:57 12/25/21 15:11 12/25/21 20:21 12/26/21 12:03 12/26/21 17:54  B Natriuretic Peptide 0.0 - 100.0 pg/mL 1,115.7 (H) 864.7 (H)     Troponin I (High Sensitivity) <18 ng/L 42 (H) 41 (H) 41 (H) 41 (H) 62 (H)  (H): Data is abnormally high  12/23/2021: Glucose 106, BUN/Cr 17/1.48.  EGFR 48. Na/K 138/4.1. Rest of the CMP normal H/H 14/46. MCV 100. Platelets 279  08/2021: HbA1C 6.0% Chol 99, TG 72, HDL 39, LDL 45 TSH 5.0 normal   Latest Reference Range & Units 12/15/21 01:01 12/15/21 03:14 12/23/21 13:04 12/23/21 14:57  B Natriuretic Peptide 0.0 - 100.0 pg/mL 1,878.1 (H)   1,115.7 (H)  Troponin I (High Sensitivity) <18 ng/L 57 (H) 51 (H) 45 (H) 42 (H)  (H): Data is abnormally high    Review of Systems  Cardiovascular:  Negative for chest pain, dyspnea on exertion, leg swelling, palpitations and syncope.  Respiratory:  Negative for shortness of breath.   Genitourinary:        Self catheterization at home due to urinary retention  Neurological:  Positive for dizziness.        Vitals:   01/23/22 1136  BP: 137/68  Pulse: (!) 56  Resp: 16  Temp: (!) 97.5 F (36.4 C)  SpO2: 100%     Body mass index is 22.6 kg/m. Filed Weights   01/23/22 1136  Weight: 140 lb (63.5 kg)     Objective:   Physical Exam Vitals and nursing note reviewed.  Constitutional:      General: He is not in acute distress. Neck:     Vascular: JVD present.  Cardiovascular:     Rate and Rhythm: Normal rate. Rhythm irregular.     Pulses:          Dorsalis pedis pulses are 0 on the right side and 0 on the left side.       Posterior tibial pulses are 0 on the right side and 1+ on the left side.     Heart sounds: Murmur heard.  Harsh midsystolic murmur is present with a grade of 2/6 at the upper right sternal border radiating to the neck.  Pulmonary:     Effort: Pulmonary effort is normal.     Breath sounds: Normal breath sounds. No wheezing or rales.  Musculoskeletal:     Right lower leg: No edema.     Left lower leg: No edema.       Assessment & Recommendations:   81year old caucasian male with coronary artery disease status s/p CABG (patent LIMA-LAD, occluded SVG-ramus/OM Y graft), ischemic cardiomyopathy with recovered EF, s/p ICD, mod AS, persistent Afib, PAD s/o  aortobifemoral bypass, mod carotid disease, h/o stroke, CKD III  Chronic HFpEF: Likely combination of ischemic cardiomyopathy, also component of mild RV systolic dysfunction. He possibly also has paradoxical low flow low gradient aortic stenosis,  which may also be contributing. He has done with medical therapy with torsemide as preferred diuretic. Continue spironolactone 12.5 mg daily, bisoprolol 2.5 mg daily. Given stability of symptoms on medical therapy, I do not think he needs any invasive work-up at this time. Will repeat echocardiogram in 6 months, will order at next visit  CAD: Suspected mild chronic ischemia at baseline, with mildly elevated but flat troponins. Continue aspirin 81 mg, bisoprolol 2.5 mg, Imdur 30 mg, Repatha. Angina symptoms have improved on Imdur.   Given stability of symptoms on medical therapy, I do not think he needs any invasive work-up at this time.  Persistent A. fib/atrial flutter: Longstanding.  Rate controlled. High CHA2DS2VAsc score Continue Eliquis 5 mg daily.     H/o NSVT: Continue amiodarone 200 mg daily.   CKD stage III: Stable  PAD: S/p aortobifem bypass.  No critical limb ischemia.   F/u in 3 months     Nigel Mormon, MD Pager: [phone] Office: [phone]

## 2022-02-03 ENCOUNTER — Ambulatory Visit: Payer: Medicare Other | Admitting: Physician Assistant

## 2022-02-04 ENCOUNTER — Other Ambulatory Visit: Payer: Self-pay | Admitting: *Deleted

## 2022-02-04 NOTE — Patient Outreach (Signed)
[facility]) Care Management  02/04/2022  Ryan [name] [birth year] [id number]   Telephone Assessment-Unsuccessful  RN attempted outreach call today however unsuccessful. RN able to leave a HIPAA approved voice message requesting a call back.  Will follow up once again over the next week for ongoing [facility] services.  [name], RN Care Management Coordinator [place] Office [phone]

## 2022-02-10 ENCOUNTER — Other Ambulatory Visit: Payer: Self-pay | Admitting: *Deleted

## 2022-02-10 NOTE — Patient Outreach (Signed)
[facility]) Care Management Telephonic RN Care Manager Note   02/10/2022 Name:  Ryan [name] MRN:  [id number] DOB:  [birth year]  Summary: Pt maintain adherence to the plan of care with no acute symptoms or exacerbated episodes with his CHF since the last conversation.  Recommendations/Changes made from today's visit: All changes and events documented with the plan of care.  Subjective: Ryan [name] is an 81 y.o. year old male who is a primary patient of Ryan [name]., MD. The care management team was consulted for assistance with care management and/or care coordination needs.    Telephonic RN Care Manager completed Telephone Visit today.  Objective:   Medications Reviewed Today     Reviewed by Ryan [name] (Medical Assistant) on 01/23/22 at 1131  Med List Status: <None>   Medication Order Taking? Sig Documenting Provider Last Dose Status Informant  acetaminophen (TYLENOL) 325 MG tablet [id number]  Take 2 tablets (650 mg total) by mouth every 6 (six) hours as needed for mild pain (or Fever >/= 101). Ryan [name] [place], DO  Active Spouse/Significant Other  allopurinol (ZYLOPRIM) 100 MG tablet [id number]  Take 100 mg by mouth daily as needed (gout). [provider]  Active Spouse/Significant Other  ALPRAZolam (XANAX) 0.5 MG tablet [id number]  Take 0.5 mg by mouth 3 (three) times daily as needed for anxiety or sleep. [provider]  Active Spouse/Significant Other  amiodarone (PACERONE) 200 MG tablet [id number]  Take 1 tablet (200 mg total) by mouth daily. Mercy Riding, MD  Active Spouse/Significant Other  apixaban (ELIQUIS) 5 MG TABS tablet [id number]  Take 1 tablet (5 mg total) by mouth 2 (two) times daily. Ryan Dopp T, PA-C  Active Spouse/Significant Other  bisoprolol (ZEBETA) 5 MG tablet [id number]  Take 0.5 tablets (2.5 mg total) by mouth daily. Ryan Dopp T, PA-C  Active Spouse/Significant Other  finasteride (PROSCAR)  5 MG tablet [id number]  Take 5 mg by mouth daily. [provider]  Active Spouse/Significant Other  isosorbide mononitrate (IMDUR) 30 MG 24 hr tablet [id number]  Take 1 tablet (30 mg total) by mouth daily. Patwardhan, Reynold Bowen, MD  Active   levothyroxine (SYNTHROID) 25 MCG tablet [id number]  Take 25 mcg by mouth daily. [provider]  Active Spouse/Significant Other  meclizine (ANTIVERT) 12.5 MG tablet [id number]  Take 1 tablet (12.5 mg total) by mouth 3 (three) times daily as needed for dizziness. Patwardhan, Reynold Bowen, MD  Active   metoCLOPramide (REGLAN) 5 MG tablet [id number]  Take 1 tablet (5 mg total) by mouth every 6 (six) hours as needed for nausea. Ryan Cater, PA-C  Active Spouse/Significant Other  nitroGLYCERIN (NITROSTAT) 0.4 MG SL tablet [id number]  Place 1 tablet (0.4 mg total) under the tongue every 5 (five) minutes as needed for chest pain. Ryan Lance, MD  Active Spouse/Significant Other  ondansetron (ZOFRAN-ODT) 4 MG disintegrating tablet [id number]  Take 4 mg by mouth every 6 (six) hours as needed for nausea/vomiting. [provider]  Active Spouse/Significant Other  pantoprazole (PROTONIX) 40 MG tablet [id number]  Take 1 tablet (40 mg total) by mouth daily. Ryan Dopp T, PA-C  Active Spouse/Significant Other  polyethylene glycol (MIRALAX / GLYCOLAX) 17 g packet [id number]  Take 17 g by mouth 2 (two) times daily. [provider]  Active Spouse/Significant Other  REPATHA SURECLICK 536 MG/ML SOAJ [id number]  INJECT 1 PEN INTO THE SKIN EVERY 14 DAYS.  Patient taking differently: Inject 140 mg into the skin every 14 (fourteen)  days.   [name], [name], MD  Active Spouse/Significant Other  senna-docusate (SENOKOT-S) 8.6-50 MG tablet [id number]  Take 1 tablet by mouth at bedtime. Ryan [name] [place], DO  Active Spouse/Significant Other  spironolactone (ALDACTONE) 25 MG tablet [id number]  Take 0.5 tablets (12.5 mg total) by mouth daily. Ryan Dubonnet, NP   Active Spouse/Significant Other  tamsulosin (FLOMAX) 0.4 MG CAPS capsule [id number]  Take 0.4 mg by mouth daily. [provider]  Active Spouse/Significant Other  torsemide (DEMADEX) 20 MG tablet [id number]  Take 1 tablet (20 mg total) by mouth daily. Patwardhan, Reynold Bowen, MD  Active              SDOH:  (Social Determinants of Health) assessments and interventions performed:     Care Plan  Review of patient past medical history, allergies, medications, health status, including review of consultants reports, laboratory and other test data, was performed as part of comprehensive evaluation for care management services.   Care Plan : RN Care Manger Plan of Care  Updates made by Ryan Bastos, RN since 02/10/2022 12:00 AM     Problem: Knowledge Deficit related to CHF and care coordination needs   Priority: High     Long-Range Goal: Development of plan of care formanagement of CHF. and care coordination needs.   Start Date: 11/26/2021  Expected End Date: 06/19/2022  This Visit's Progress: On track  Recent Progress: On track  Priority: High  Note:   Current Barriers:  Knowledge Deficits related to plan of care for management of CHF   RNCM Clinical Goal(s):  Patient will verbalize understanding of plan for management of CHF as evidenced by responding to teach-back information that was educated. verbalize basic understanding of  CHF disease process and self health management plan as evidenced by seld awareness of the HF zones and what to do if acute symptom occur. take all medications exactly as prescribed and will call provider for medication related questions as evidenced by refilling all medications  attend all scheduled medical appointments: 12/15 as evidenced by Discussed  through collaboration with RN Care manager, provider, and care team.   Interventions: Inter-disciplinary care team collaboration (see longitudinal plan of care) Evaluation of current treatment plan  related to  self management and patient's adherence to plan as established by provider Update 1/17-Spoke wit spouse Ryan [name]) who provided update on pt's ongoing care. Reports daily weights at baseline at 140-141 lbs however ongoing history of left ankle swelling at a minimal. No other relates symptoms. Encouraged pt to elevate LE and use compression stocking that he has available. Pt will follow up with CAD provider on Friday. Note recent change in medication from Lasix to Torsemide and pt is consuming there requested 3-4 bottles of H2O.  Heart Failure Interventions:  (Status:  Goal on track:  Yes.) Long Term Goal Basic overview and discussion of pathophysiology of Heart Failure reviewed Advised patient to weigh each morning after emptying bladder Discussed importance of daily weight and advised patient to weigh and record daily Reviewed role of diuretics in prevention of fluid overload and management of heart failure; Discussed the importance of keeping all appointments with provider  2/21- Update: Spoke with pt today who reports his  weights at baseline 138 lbs yesterday, 137 today and no weight gained over the last week. Pt denies any swelling and continues to wear his TEDs with no incidents reported.  Continued to verified sufficient transportation to all medical appointments and adherent to all prescribed medications with  no delays. Pt aware of what to do if acute symptoms should occur and remains in the GREEN zone with no symptoms since the last conversation. Will follow up next month as discussed today with pt's ongoing adherence to the plan of care generated and reviewed today. Addressed all questions and inquires with no further needs at this time.  Patient Goals/Self-Care Activities: Take all medications as prescribed Attend all scheduled provider appointments Perform all self care activities independently  Perform IADL's (shopping, preparing meals, housekeeping, managing finances)  independently Call provider office for new concerns or questions   Follow Up Plan:  Telephone follow up appointment with care management team member scheduled for:  March 2023 The patient has been provided with contact information for the care management team and has been advised to call with any health related questions or concerns.       [name], RN Care Management Coordinator [place] Office [phone]

## 2022-02-12 ENCOUNTER — Other Ambulatory Visit: Payer: Self-pay | Admitting: Cardiology

## 2022-02-12 DIAGNOSIS — R42 Dizziness and giddiness: Secondary | ICD-10-CM

## 2022-02-17 DIAGNOSIS — I130 Hypertensive heart and chronic kidney disease with heart failure and stage 1 through stage 4 chronic kidney disease, or unspecified chronic kidney disease: Secondary | ICD-10-CM | POA: Diagnosis not present

## 2022-02-17 DIAGNOSIS — I5022 Chronic systolic (congestive) heart failure: Secondary | ICD-10-CM | POA: Diagnosis not present

## 2022-02-17 DIAGNOSIS — N1831 Chronic kidney disease, stage 3a: Secondary | ICD-10-CM | POA: Diagnosis not present

## 2022-02-17 DIAGNOSIS — E785 Hyperlipidemia, unspecified: Secondary | ICD-10-CM | POA: Diagnosis not present

## 2022-02-25 DIAGNOSIS — R3912 Poor urinary stream: Secondary | ICD-10-CM | POA: Diagnosis not present

## 2022-02-25 DIAGNOSIS — N401 Enlarged prostate with lower urinary tract symptoms: Secondary | ICD-10-CM | POA: Diagnosis not present

## 2022-03-11 ENCOUNTER — Other Ambulatory Visit: Payer: Self-pay | Admitting: *Deleted

## 2022-03-11 NOTE — Patient Outreach (Signed)
[facility]) Care Management ? ?03/11/2022 ? ?Angus Palms [place] ?December 21, 1941 ?[id number] ? ? ?Telephone Assessment-Re: Call Back ? ?RN spoke with pt's spouse today who indicated both her and her husband were on their way to a funeral and requested a call back.  ? ?RN rescheduled for tomorrow late morning and confirmed this was a good time for the call back (verified).  ? ?[name], RN ?Care Management Coordinator ?[place] ?Main Office [phone]  ?

## 2022-03-12 ENCOUNTER — Other Ambulatory Visit: Payer: Self-pay | Admitting: *Deleted

## 2022-03-12 NOTE — Patient Outreach (Signed)
[facility]) Care Management ? ?03/12/2022 ? ?Angus Palms [place] ?1941-07-05 ?[id number] ? ? ?Telephone Assessment-Unsuccessful ? ?RN attempted outreach call today for the requested call back however unsuccessful. RN able to leave a HIPAA approved voice message requesting a call back. ? ?Will rescheduled another outreach call over the next week for ongoing [facility] services. ? ?[name], RN ?Care Management Coordinator ?[place] ?Main Office [phone]  ?

## 2022-03-13 ENCOUNTER — Telehealth: Payer: Self-pay

## 2022-03-13 NOTE — Telephone Encounter (Signed)
Spoke with the patient.  In addition to several other complaints like dizziness, constipation, he has had retrosternal chest pain radiating to his neck, requiring at least 2-3 nitroglycerin pills in last day or 2 with improvement in his symptoms.  He denies any leg edema or weight gain.  I recommended him to go to emergency room for evaluation to rule out ACS.  Patient is very reluctant to go to emergency room due to waiting time involved.  He states that he is going to try his best to not go to the hospital.  I again encouraged him to go to the hospital given his acuity of symptoms.  If his symptoms improve and he does not go to the emergency room, I will be happy to see him in the office on Monday afternoon.  I encouraged him to call back on Monday afternoon if he does not end up going to the emergency room. ? ?? ?Nigel Mormon, MD ?Pager: [phone] ?Office: [phone] ?

## 2022-03-16 ENCOUNTER — Inpatient Hospital Stay (HOSPITAL_COMMUNITY)
Admission: RE | Admit: 2022-03-16 | Discharge: 2022-03-18 | DRG: 286 | Disposition: A | Discharge status: Home / Self Care | Payer: Medicare Other | Source: Ambulatory Visit | Attending: Cardiology | Admitting: Cardiology

## 2022-03-16 ENCOUNTER — Ambulatory Visit: Payer: Medicare Other | Admitting: Cardiology

## 2022-03-16 ENCOUNTER — Other Ambulatory Visit: Payer: Self-pay

## 2022-03-16 ENCOUNTER — Encounter: Payer: Self-pay | Admitting: Cardiology

## 2022-03-16 ENCOUNTER — Encounter (HOSPITAL_COMMUNITY): Payer: Self-pay | Admitting: Cardiology

## 2022-03-16 VITALS — BP 121/61 | HR 51 | Temp 98.0°F | Resp 16 | Ht 66.0 in | Wt 142.0 lb

## 2022-03-16 DIAGNOSIS — E039 Hypothyroidism, unspecified: Secondary | ICD-10-CM | POA: Diagnosis not present

## 2022-03-16 DIAGNOSIS — N400 Enlarged prostate without lower urinary tract symptoms: Secondary | ICD-10-CM | POA: Diagnosis present

## 2022-03-16 DIAGNOSIS — M109 Gout, unspecified: Secondary | ICD-10-CM | POA: Diagnosis present

## 2022-03-16 DIAGNOSIS — M1990 Unspecified osteoarthritis, unspecified site: Secondary | ICD-10-CM | POA: Diagnosis present

## 2022-03-16 DIAGNOSIS — Z7901 Long term (current) use of anticoagulants: Secondary | ICD-10-CM

## 2022-03-16 DIAGNOSIS — D6859 Other primary thrombophilia: Secondary | ICD-10-CM | POA: Diagnosis present

## 2022-03-16 DIAGNOSIS — I130 Hypertensive heart and chronic kidney disease with heart failure and stage 1 through stage 4 chronic kidney disease, or unspecified chronic kidney disease: Secondary | ICD-10-CM | POA: Diagnosis not present

## 2022-03-16 DIAGNOSIS — Z86711 Personal history of pulmonary embolism: Secondary | ICD-10-CM

## 2022-03-16 DIAGNOSIS — Z9049 Acquired absence of other specified parts of digestive tract: Secondary | ICD-10-CM

## 2022-03-16 DIAGNOSIS — Z79899 Other long term (current) drug therapy: Secondary | ICD-10-CM

## 2022-03-16 DIAGNOSIS — E785 Hyperlipidemia, unspecified: Secondary | ICD-10-CM | POA: Diagnosis present

## 2022-03-16 DIAGNOSIS — I350 Nonrheumatic aortic (valve) stenosis: Secondary | ICD-10-CM | POA: Diagnosis not present

## 2022-03-16 DIAGNOSIS — I255 Ischemic cardiomyopathy: Secondary | ICD-10-CM | POA: Diagnosis present

## 2022-03-16 DIAGNOSIS — K219 Gastro-esophageal reflux disease without esophagitis: Secondary | ICD-10-CM | POA: Diagnosis present

## 2022-03-16 DIAGNOSIS — I200 Unstable angina: Principal | ICD-10-CM | POA: Diagnosis present

## 2022-03-16 DIAGNOSIS — I25110 Atherosclerotic heart disease of native coronary artery with unstable angina pectoris: Secondary | ICD-10-CM | POA: Diagnosis not present

## 2022-03-16 DIAGNOSIS — I5033 Acute on chronic diastolic (congestive) heart failure: Secondary | ICD-10-CM | POA: Diagnosis not present

## 2022-03-16 DIAGNOSIS — N183 Chronic kidney disease, stage 3 unspecified: Secondary | ICD-10-CM | POA: Diagnosis present

## 2022-03-16 DIAGNOSIS — Z87891 Personal history of nicotine dependence: Secondary | ICD-10-CM

## 2022-03-16 DIAGNOSIS — Z888 Allergy status to other drugs, medicaments and biological substances status: Secondary | ICD-10-CM

## 2022-03-16 DIAGNOSIS — Z96643 Presence of artificial hip joint, bilateral: Secondary | ICD-10-CM | POA: Diagnosis present

## 2022-03-16 DIAGNOSIS — I25810 Atherosclerosis of coronary artery bypass graft(s) without angina pectoris: Secondary | ICD-10-CM | POA: Diagnosis present

## 2022-03-16 DIAGNOSIS — I5030 Unspecified diastolic (congestive) heart failure: Secondary | ICD-10-CM | POA: Diagnosis present

## 2022-03-16 DIAGNOSIS — Z8616 Personal history of COVID-19: Secondary | ICD-10-CM

## 2022-03-16 DIAGNOSIS — I739 Peripheral vascular disease, unspecified: Secondary | ICD-10-CM | POA: Diagnosis not present

## 2022-03-16 DIAGNOSIS — G8929 Other chronic pain: Secondary | ICD-10-CM | POA: Diagnosis present

## 2022-03-16 DIAGNOSIS — I4819 Other persistent atrial fibrillation: Secondary | ICD-10-CM | POA: Diagnosis present

## 2022-03-16 DIAGNOSIS — Z86718 Personal history of other venous thrombosis and embolism: Secondary | ICD-10-CM

## 2022-03-16 DIAGNOSIS — I252 Old myocardial infarction: Secondary | ICD-10-CM

## 2022-03-16 DIAGNOSIS — Z885 Allergy status to narcotic agent status: Secondary | ICD-10-CM

## 2022-03-16 DIAGNOSIS — Z95810 Presence of automatic (implantable) cardiac defibrillator: Secondary | ICD-10-CM

## 2022-03-16 DIAGNOSIS — K5900 Constipation, unspecified: Secondary | ICD-10-CM | POA: Diagnosis present

## 2022-03-16 DIAGNOSIS — H9313 Tinnitus, bilateral: Secondary | ICD-10-CM | POA: Diagnosis present

## 2022-03-16 DIAGNOSIS — M549 Dorsalgia, unspecified: Secondary | ICD-10-CM | POA: Diagnosis present

## 2022-03-16 DIAGNOSIS — I4811 Longstanding persistent atrial fibrillation: Secondary | ICD-10-CM | POA: Diagnosis not present

## 2022-03-16 DIAGNOSIS — Z8249 Family history of ischemic heart disease and other diseases of the circulatory system: Secondary | ICD-10-CM

## 2022-03-16 DIAGNOSIS — Z8673 Personal history of transient ischemic attack (TIA), and cerebral infarction without residual deficits: Secondary | ICD-10-CM

## 2022-03-16 DIAGNOSIS — I5032 Chronic diastolic (congestive) heart failure: Secondary | ICD-10-CM | POA: Diagnosis present

## 2022-03-16 DIAGNOSIS — Z886 Allergy status to analgesic agent status: Secondary | ICD-10-CM

## 2022-03-16 LAB — BRAIN NATRIURETIC PEPTIDE: B Natriuretic Peptide: 1104.5 pg/mL — ABNORMAL HIGH (ref 0.0–100.0)

## 2022-03-16 LAB — COMPREHENSIVE METABOLIC PANEL
ALT: 20 U/L (ref 0–44)
AST: 32 U/L (ref 15–41)
Albumin: 3 g/dL — ABNORMAL LOW (ref 3.5–5.0)
Alkaline Phosphatase: 60 U/L (ref 38–126)
Anion gap: 10 (ref 5–15)
BUN: 15 mg/dL (ref 8–23)
CO2: 27 mmol/L (ref 22–32)
Calcium: 8.6 mg/dL — ABNORMAL LOW (ref 8.9–10.3)
Chloride: 100 mmol/L (ref 98–111)
Creatinine, Ser: 1.62 mg/dL — ABNORMAL HIGH (ref 0.61–1.24)
GFR, Estimated: 43 mL/min — ABNORMAL LOW (ref >60)
Glucose, Bld: 106 mg/dL — ABNORMAL HIGH (ref 70–99)
Potassium: 3.6 mmol/L (ref 3.5–5.1)
Sodium: 137 mmol/L (ref 135–145)
Total Bilirubin: 0.8 mg/dL (ref 0.3–1.2)
Total Protein: 6.4 g/dL — ABNORMAL LOW (ref 6.5–8.1)

## 2022-03-16 LAB — TROPONIN I (HIGH SENSITIVITY): Troponin I (High Sensitivity): 45 ng/L — ABNORMAL HIGH (ref <18)

## 2022-03-16 MED ORDER — FUROSEMIDE 10 MG/ML IJ SOLN
40.0000 mg | Freq: Two times a day (BID) | INTRAMUSCULAR | Status: DC
  Administered 2022-03-16 – 2022-03-17 (×2): 40 mg via INTRAVENOUS
  Filled 2022-03-16 (×4): qty 4

## 2022-03-16 MED ORDER — NITROGLYCERIN IN D5W 200-5 MCG/ML-% IV SOLN
10.0000 ug/min | INTRAVENOUS | Status: DC
Start: 2022-03-16 — End: 2022-03-18
  Administered 2022-03-16: 10 ug/min via INTRAVENOUS
  Filled 2022-03-16: qty 250

## 2022-03-16 MED ORDER — NITROGLYCERIN 0.4 MG SL SUBL: 0.4000 mg | SUBLINGUAL_TABLET | SUBLINGUAL | Status: DC | PRN

## 2022-03-16 MED ORDER — POLYETHYLENE GLYCOL 3350 17 G PO PACK
17.0000 g | PACK | Freq: Every day | ORAL | Status: DC
  Administered 2022-03-16: 17 g via ORAL
  Filled 2022-03-16: qty 1

## 2022-03-16 MED ORDER — ASPIRIN 300 MG RE SUPP
300.0000 mg | RECTAL | Status: DC
  Filled 2022-03-16: qty 1

## 2022-03-16 MED ORDER — ASPIRIN 81 MG PO CHEW: 324.0000 mg | CHEWABLE_TABLET | ORAL | Status: DC

## 2022-03-16 MED ORDER — CLOPIDOGREL BISULFATE 75 MG PO TABS
75.0000 mg | ORAL_TABLET | Freq: Every day | ORAL | Status: DC
  Administered 2022-03-16 – 2022-03-18 (×3): 75 mg via ORAL
  Filled 2022-03-16 (×3): qty 1

## 2022-03-16 MED ORDER — HEPARIN (PORCINE) 25000 UT/250ML-% IV SOLN
1000.0000 [IU]/h | INTRAVENOUS | Status: DC
  Administered 2022-03-16: 900 [IU]/h via INTRAVENOUS
  Filled 2022-03-16: qty 250

## 2022-03-16 MED ORDER — ASPIRIN EC 81 MG PO TBEC: 81.0000 mg | DELAYED_RELEASE_TABLET | Freq: Every day | ORAL | Status: DC

## 2022-03-16 MED ORDER — ACETAMINOPHEN 325 MG PO TABS: 650.0000 mg | ORAL_TABLET | ORAL | Status: DC | PRN

## 2022-03-16 MED ORDER — ENSURE ENLIVE PO LIQD
237.0000 mL | Freq: Two times a day (BID) | ORAL | Status: DC
  Administered 2022-03-18: 237 mL via ORAL

## 2022-03-16 NOTE — Progress Notes (Signed)
? ? ?Patient referred by Ginger Organ., MD for chest pain, shortness of breath ? ?Subjective:  ? ?Ryan [name], male    DOB: [birth year], 81 y.o.   MRN: [id number] ? ? ?Chief Complaint  ?Patient presents with  ? Chronic heart failure with preserved ejection fraction   ? Chest Pain  ? ? ? ?HPI ? ?81 year old caucasian male with coronary artery disease status s/p CABG (patent LIMA-LAD, occluded SVG-ramus/OM Y graft), ischemic cardiomyopathy with recovered EF, s/p ICD, mod AS, persistent Afib, PAD s/o aortobifemoral bypass, mod carotid disease, h/o stroke, CKD III ?  ?For the past few days, patient multitude of complaints.  He does report retrosternal chest pain, several times a day, that does seem to improve with sublingual nitroglycerin.  However, in addition, he also has severe constipation, has not passed bowel movement in 4 days, and has associated nausea.  He is also noticed left leg swelling.  In addition, he has noticed tinnitus in both his ears, for which she is supposed to see ear nose and throat. ? ?Current Outpatient Medications on File Prior to Visit  ?Medication Sig Dispense Refill  ? acetaminophen (TYLENOL) 325 MG tablet Take 2 tablets (650 mg total) by mouth every 6 (six) hours as needed for mild pain (or Fever >/= 101). 20 tablet 0  ? allopurinol (ZYLOPRIM) 100 MG tablet Take 100 mg by mouth daily as needed (gout).    ? ALPRAZolam (XANAX) 0.5 MG tablet Take 0.5 mg by mouth 3 (three) times daily as needed for anxiety or sleep.    ? amiodarone (PACERONE) 200 MG tablet Take 1 tablet (200 mg total) by mouth daily. 90 tablet 1  ? apixaban (ELIQUIS) 5 MG TABS tablet Take 1 tablet (5 mg total) by mouth 2 (two) times daily. 180 tablet 3  ? bisoprolol (ZEBETA) 5 MG tablet Take 0.5 tablets (2.5 mg total) by mouth daily. 45 tablet 3  ? finasteride (PROSCAR) 5 MG tablet Take 5 mg by mouth daily.    ? isosorbide mononitrate (IMDUR) 30 MG 24 hr tablet Take 1 tablet (30 mg total) by mouth daily. 30 tablet 2   ? isosorbide mononitrate (IMDUR) 30 MG 24 hr tablet 1 tablet in the morning    ? meclizine (ANTIVERT) 12.5 MG tablet TAKE ONE TABLET BY MOUTH THREE TIMES A DAY AS NEEDED FOR DIZZINESS 60 tablet 1  ? metoCLOPramide (REGLAN) 5 MG tablet Take 1 tablet (5 mg total) by mouth every 6 (six) hours as needed for nausea. 15 tablet 0  ? nitroGLYCERIN (NITROSTAT) 0.4 MG SL tablet Place 1 tablet (0.4 mg total) under the tongue every 5 (five) minutes as needed for chest pain. 25 tablet 3  ? ondansetron (ZOFRAN-ODT) 4 MG disintegrating tablet Take 4 mg by mouth every 6 (six) hours as needed for nausea/vomiting.    ? pantoprazole (PROTONIX) 40 MG tablet Take 1 tablet (40 mg total) by mouth daily. 30 tablet 11  ? polyethylene glycol (MIRALAX / GLYCOLAX) 17 g packet Take 17 g by mouth 2 (two) times daily.    ? REPATHA SURECLICK 174 MG/ML SOAJ INJECT 1 PEN INTO THE SKIN EVERY 14 DAYS. (Patient taking differently: Inject 140 mg into the skin every 14 (fourteen) days.) 2 mL 11  ? spironolactone (ALDACTONE) 25 MG tablet Take 0.5 tablets (12.5 mg total) by mouth daily. 45 tablet 3  ? tamsulosin (FLOMAX) 0.4 MG CAPS capsule Take 0.4 mg by mouth daily.    ? torsemide (DEMADEX) 20  MG tablet Take 1 tablet (20 mg total) by mouth daily. 90 tablet 3  ? ?No current facility-administered medications on file prior to visit.  ? ? ?Cardiovascular and other pertinent studies: ? ?EKG 03/16/2022: ?Atrial fibrillation 55 bpm  ?Diffuse nonspecific T-abnormality  ?Low voltage ?Old anteroseptal infarct ? ? ?Echocardiogram 12/27/2021: ? 1. Left ventricular ejection fraction, by estimation, is 50 to 55%. The  ?left ventricle has low normal function. The left ventricle demonstrates  ?regional wall motion abnormalities (see scoring diagram/findings for  ?description). Diastolic function not  ?assessed due to Afib. There is severe hypokinesis of the left ventricular,  ?basal anteroseptal wall.  ? 2. Right ventricular systolic function is mildly reduced. The right   ?ventricular size is normal. There is mildly elevated pulmonary artery  ?systolic pressure.  ? 3. Left atrial size was severely dilated.  ? 4. Right atrial size was moderately dilated.  ? 5. The mitral valve is grossly normal. Mild mitral valve regurgitation.  ? 6. Mild tricuspid regurgitation. peak RA-RV gradient 33 mmHg. . Tricuspid  ?valve regurgitation is mild.  ? 7. Vmax 2.3 m/sec, mean PG 13 mmHg. AVA by continuity equation is 0.9  ?cm2, with dimensionless index of 0.24. Small LV cavity with LV SV index of  ?20 cc/m2. Likely paradoxically low flow low gradient severe aortic  ?stenosis. . The aortic valve is  ?tricuspid. There is moderate calcification of the aortic valve.  ? 8. Overall, LVEF assessment remains difficult. No significant change  ?compared to previous study in 08/2021.  ? ?Coronary and bypass graft angiography 01/2019: ?Severe native coronary artery disease with diffuse proximal to mid LAD stenosis with diffusely diseased native LAD proximally and mid with competitive filling from the LIMA graft; 90% proximal stenosis in the ramus immediate vessel, diffuse stenosis of 60 to 70% in the proximal circumflex with diffuse 80% distal stenosis and total occlusion of the AV groove after marginal vessel with total occlusion of the proximal native RCA.  There is extensive collateralization to the distal RCA both via the circumflex vessel as well as via the LIMA to LAD and distal LAD. ?  ?Patent LIMA graft supplying the mid LAD ?  ?Previously documented old occlusion of the previous Y graft which had supplied the ramus and marginal vessel in the vein graft which had supplied the PDA. ?  ?Low right heart pressures with mean PA pressure at 19 mmHg. ?  ?RECOMMENDATION: ?Increase medical therapy.  The patient has only been on low-dose anti-ischemic medical regimen consisting of bisoprolol 2.5 mg as well as isosorbide 60 mg.  Recommend initiation of amlodipine 5 mg.  Also recommend consideration of Ranexa 500 mg  twice a day.  The patient is on rosuvastatin.  However if the patient is to be on atorvastatin, the maximum recommended dose of atorvastatin is 40 mg if the patient is on ranolazine. ? ? ?Echocardiogram 08/28/2021: ?1. Left ventricular ejection fraction, by estimation, is 50 to 55%. The left ventricle has low normal function. The left ventricle demonstrates regional wall motion abnormalities. Basal anterior/anteroseptal hypokinesis. There is severe asymmetric left ?ventricular hypertrophy of the basal-septal segment. Left ventricular diastolic parameters are indeterminate. ?2. Right ventricular systolic function is moderately reduced. The right ventricular size is normal. There is normal pulmonary artery systolic pressure. ?3. The mitral valve is normal in structure. Trivial mitral valve regurgitation. No evidence of mitral stenosis. ?4. The inferior vena cava is normal in size with greater than 50% respiratory variability, suggesting right atrial pressure of 3  mmHg. ?5. The aortic valve is calcified. There is moderate calcification of the aortic valve. Aortic valve regurgitation is not visualized. Moderate aortic valve stenosis. Mean gradient only 50mHg, but AVA 1.0 cm^2 and DI 0.35, suspect paradoxical low flow low gradient moderate AS given low SV index (21cc/m^2) ?6. Small mobile echodensity adjacent to RV pacemaker lead (seen on image 14), could represent fibrinous strand or small adherent thrombus; if clinical concern for infection would check blood cultures ? ? ?Recent labs: ?01/07/2022: ?Glucose 102, BUN/Cr 23/1.41. EGFR 50. Na/K 137/4.9.  ?BNP 599 ? ?12/31/2021: ?BNP 592 ? ?12/28/2021: ?Glucose 91, BUN/Cr 21/1.54. EGFR 45. Na/K 138/3.7.  ?H/H 13/40. MCV 97. Platelets 258 ? ? Latest Reference Range & Units 12/23/21 14:57 12/25/21 15:11 12/25/21 20:21 12/26/21 12:03 12/26/21 17:54  ?B Natriuretic Peptide 0.0 - 100.0 pg/mL 1,115.7 (H) 864.7 (H)     ?Troponin I (High Sensitivity) <18 ng/L 42 (H) 41 (H) 41 (H) 41 (H)  62 (H)  ?(H): Data is abnormally high ? ?12/23/2021: ?Glucose 106, BUN/Cr 17/1.48. EGFR 48. Na/K 138/4.1. Rest of the CMP normal ?H/H 14/46. MCV 100. Platelets 279 ? ?08/2021: ?HbA1C 6.0% ?Chol 99, TG 72

## 2022-03-16 NOTE — Progress Notes (Signed)
ANTICOAGULATION CONSULT NOTE - Initial Consult ? ?Pharmacy Consult for heparin ?Indication: Chronic unstable angina  ? ?Allergies  ?Allergen Reactions  ? Jardiance [Empagliflozin] Nausea Only and Other (See Comments)  ?  Made patient very sick to the stomach.  ? Ezetimibe Other (See Comments)  ?  Myalgias  ?Other reaction(s): myalgias  ? Ace Inhibitors Other (See Comments)  ?  Other reaction(s): cough  ? Aspirin Other (See Comments)  ?  Other reaction(s): skin rash/easy bruising  ? Atorvastatin Other (See Comments)  ?  Other reaction(s): weakness, fatigue (severe)  ? Ezetimibe-Simvastatin Other (See Comments)  ?  Other reaction(s): myalgias  ? Codeine Nausea And Vomiting  ?    ?Other reaction(s): nausea  ? Unithroid [Levothyroxine Sodium] Other (See Comments)  ?  Caused blurry vision per pt  ? ? ?Patient Measurements: ?  ?Heparin Dosing Weight: 64kg ? ?Vital Signs: ?Temp: 98 ?F (36.7 ?C) (03/27 1430) ?BP: 121/61 (03/27 1430) ?Pulse Rate: 51 (03/27 1430) ? ?Labs: ?No results for input(s): HGB, HCT, PLT, APTT, LABPROT, INR, HEPARINUNFRC, HEPRLOWMOCWT, CREATININE, CKTOTAL, CKMB, TROPONINIHS in the last 72 hours. ? ?CrCl cannot be calculated (Patient's most recent lab result is older than the maximum 21 days allowed.). ? ? ?Medical History: ?Past Medical History:  ?Diagnosis Date  ? Acquired thrombophilia ([place]) 11/01/2021  ? Arthritis   ? CAD (coronary artery disease)   ? Carotid artery occlusion   ? Cataract   ? 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 CHF (congestive heart failure) ([place])   ? Chronic back pain   ? COVID-19 10/31/2021  ? DVT (deep venous thrombosis) ([place])   ? Dysrhythmia   ? Enlarged prostate   ? takes Rapaflo daily  ? GERD (gastroesophageal reflux disease)   ? occasional  ? History of colon polyps   ? History of gout   ? has colchicine prn  ? History of kidney stones   ? Hyperlipidemia   ? takes Crestor daily  ? Hypertension   ? takes Amlodipine daily  ? Hypothyroidism   ? Myocardial infarction [facility])   ?  Peripheral vascular disease ([place])   ? Prolonged QT interval 11/01/2021  ? Pulmonary emboli ([place]) 03/20/2015  ? elevated d-dimer, intermediate V/Q study, atypical chest pain and SOB. Start on Xarelto '20mg'$  BID for 3 month  ? Rapid atrial fibrillation ([place])   ? Renal insufficiency   ? Shortness of breath dyspnea   ? Urinary frequency   ? Urinary urgency   ? ?  ? ?Assessment: ?81 yo M on apixaban for afib PTA now held for procedure. Patient directly admitted from clinic. Last apixaban dose was 3/27 AM per patient. Pharmacy consulted for heparin.   ? ?Goal of Therapy:  ?Heparin level 0.3-0.7 units/ml ?aPTT 66-102 seconds ?Monitor platelets by anticoagulation protocol: Yes ?  ?Plan:  ?Start Heparin at 900 units/hr at 21:00 ?F/u aPTT until correlates with heparin level  ?Monitor daily heparin level, CBC ?Monitor for signs/symptoms of bleeding  ? ? ? ?Benetta Spar, PharmD, BCPS, BCCP ?Clinical Pharmacist ? ?Please check AMION for all [place] phone numbers ?After 10:00 PM, call [place] [phone] ? ?

## 2022-03-17 ENCOUNTER — Inpatient Hospital Stay (HOSPITAL_COMMUNITY)
Admission: RE | Disposition: A | Discharge status: Home / Self Care | Payer: Self-pay | Source: Ambulatory Visit | Attending: Cardiology

## 2022-03-17 ENCOUNTER — Other Ambulatory Visit: Payer: Self-pay | Admitting: *Deleted

## 2022-03-17 HISTORY — PX: RIGHT/LEFT HEART CATH AND CORONARY ANGIOGRAPHY: CATH118266

## 2022-03-17 LAB — POCT I-STAT EG7
Acid-Base Excess: 0 mmol/L (ref 0.0–2.0)
Acid-Base Excess: 1 mmol/L (ref 0.0–2.0)
Bicarbonate: 24.8 mmol/L (ref 20.0–28.0)
Bicarbonate: 25.8 mmol/L (ref 20.0–28.0)
Calcium, Ion: 1.05 mmol/L — ABNORMAL LOW (ref 1.15–1.40)
Calcium, Ion: 1.11 mmol/L — ABNORMAL LOW (ref 1.15–1.40)
HCT: 34 % — ABNORMAL LOW (ref 39.0–52.0)
HCT: 35 % — ABNORMAL LOW (ref 39.0–52.0)
Hemoglobin: 11.6 g/dL — ABNORMAL LOW (ref 13.0–17.0)
Hemoglobin: 11.9 g/dL — ABNORMAL LOW (ref 13.0–17.0)
O2 Saturation: 63 %
O2 Saturation: 65 %
Potassium: 4.1 mmol/L (ref 3.5–5.1)
Potassium: 4.3 mmol/L (ref 3.5–5.1)
Sodium: 138 mmol/L (ref 135–145)
Sodium: 139 mmol/L (ref 135–145)
TCO2: 26 mmol/L (ref 22–32)
TCO2: 27 mmol/L (ref 22–32)
pCO2, Ven: 38 mmHg — ABNORMAL LOW (ref 44–60)
pCO2, Ven: 39.4 mmHg — ABNORMAL LOW (ref 44–60)
pH, Ven: 7.423 (ref 7.25–7.43)
pH, Ven: 7.423 (ref 7.25–7.43)
pO2, Ven: 32 mmHg (ref 32–45)
pO2, Ven: 33 mmHg (ref 32–45)

## 2022-03-17 LAB — BASIC METABOLIC PANEL
Anion gap: 7 (ref 5–15)
Anion gap: 8 (ref 5–15)
BUN: 13 mg/dL (ref 8–23)
BUN: 13 mg/dL (ref 8–23)
CO2: 29 mmol/L (ref 22–32)
CO2: 26 mmol/L (ref 22–32)
Calcium: 8.6 mg/dL — ABNORMAL LOW (ref 8.9–10.3)
Calcium: 8.4 mg/dL — ABNORMAL LOW (ref 8.9–10.3)
Chloride: 103 mmol/L (ref 98–111)
Chloride: 104 mmol/L (ref 98–111)
Creatinine, Ser: 1.7 mg/dL — ABNORMAL HIGH (ref 0.61–1.24)
Creatinine, Ser: 1.54 mg/dL — ABNORMAL HIGH (ref 0.61–1.24)
GFR, Estimated: 40 mL/min — ABNORMAL LOW (ref >60)
GFR, Estimated: 45 mL/min — ABNORMAL LOW (ref >60)
Glucose, Bld: 89 mg/dL (ref 70–99)
Glucose, Bld: 92 mg/dL (ref 70–99)
Potassium: 4.8 mmol/L (ref 3.5–5.1)
Potassium: 4.2 mmol/L (ref 3.5–5.1)
Sodium: 139 mmol/L (ref 135–145)
Sodium: 138 mmol/L (ref 135–145)

## 2022-03-17 LAB — POCT I-STAT 7, (LYTES, BLD GAS, ICA,H+H)
Acid-Base Excess: 0 mmol/L (ref 0.0–2.0)
Bicarbonate: 24.5 mmol/L (ref 20.0–28.0)
Calcium, Ion: 1.08 mmol/L — ABNORMAL LOW (ref 1.15–1.40)
HCT: 34 % — ABNORMAL LOW (ref 39.0–52.0)
Hemoglobin: 11.6 g/dL — ABNORMAL LOW (ref 13.0–17.0)
O2 Saturation: 94 %
Potassium: 4.1 mmol/L (ref 3.5–5.1)
Sodium: 138 mmol/L (ref 135–145)
TCO2: 26 mmol/L (ref 22–32)
pCO2 arterial: 37.1 mmHg (ref 32–48)
pH, Arterial: 7.428 (ref 7.35–7.45)
pO2, Arterial: 68 mmHg — ABNORMAL LOW (ref 83–108)

## 2022-03-17 LAB — CBC
HCT: 35.7 % — ABNORMAL LOW (ref 39.0–52.0)
Hemoglobin: 11.9 g/dL — ABNORMAL LOW (ref 13.0–17.0)
MCH: 31.9 pg (ref 26.0–34.0)
MCHC: 33.3 g/dL (ref 30.0–36.0)
MCV: 95.7 fL (ref 80.0–100.0)
Platelets: 193 10*3/uL (ref 150–400)
RBC: 3.73 MIL/uL — ABNORMAL LOW (ref 4.22–5.81)
RDW: 13.1 % (ref 11.5–15.5)
WBC: 6.1 10*3/uL (ref 4.0–10.5)
nRBC: 0 % (ref 0.0–0.2)

## 2022-03-17 LAB — HEPARIN LEVEL (UNFRACTIONATED): Heparin Unfractionated: >1.1 IU/mL — ABNORMAL HIGH (ref 0.30–0.70)

## 2022-03-17 LAB — APTT
aPTT: 56 seconds — ABNORMAL HIGH (ref 24–36)
aPTT: 88 seconds — ABNORMAL HIGH (ref 24–36)

## 2022-03-17 LAB — TROPONIN I (HIGH SENSITIVITY): Troponin I (High Sensitivity): 51 ng/L — ABNORMAL HIGH (ref <18)

## 2022-03-17 SURGERY — RIGHT/LEFT HEART CATH AND CORONARY ANGIOGRAPHY
Anesthesia: LOCAL

## 2022-03-17 MED ORDER — POTASSIUM CHLORIDE CRYS ER 20 MEQ PO TBCR
40.0000 meq | EXTENDED_RELEASE_TABLET | Freq: Once | ORAL | Status: AC
  Administered 2022-03-17: 40 meq via ORAL
  Filled 2022-03-17: qty 2

## 2022-03-17 MED ORDER — VERAPAMIL HCL 2.5 MG/ML IV SOLN
INTRAVENOUS | Status: DC | PRN
  Administered 2022-03-17: 10 mL via INTRA_ARTERIAL

## 2022-03-17 MED ORDER — CHLORHEXIDINE GLUCONATE CLOTH 2 % EX PADS
6.0000 | MEDICATED_PAD | Freq: Every day | CUTANEOUS | Status: DC
  Administered 2022-03-17 – 2022-03-18 (×2): 6 via TOPICAL

## 2022-03-17 MED ORDER — SODIUM CHLORIDE 0.9 % WEIGHT BASED INFUSION
3.0000 mL/kg/h | INTRAVENOUS | Status: AC
  Administered 2022-03-17: 3 mL/kg/h via INTRAVENOUS

## 2022-03-17 MED ORDER — MELATONIN 5 MG PO TABS
5.0000 mg | ORAL_TABLET | Freq: Every evening | ORAL | Status: DC | PRN
  Administered 2022-03-17: 5 mg via ORAL
  Filled 2022-03-17: qty 1

## 2022-03-17 MED ORDER — SODIUM CHLORIDE 0.9% FLUSH: 3.0000 mL | Freq: Two times a day (BID) | INTRAVENOUS | Status: DC

## 2022-03-17 MED ORDER — HEPARIN SODIUM (PORCINE) 1000 UNIT/ML IJ SOLN
INTRAMUSCULAR | Status: AC
  Filled 2022-03-17: qty 10

## 2022-03-17 MED ORDER — POLYETHYLENE GLYCOL 3350 17 G PO PACK
17.0000 g | PACK | Freq: Two times a day (BID) | ORAL | Status: DC
  Filled 2022-03-17 (×2): qty 1

## 2022-03-17 MED ORDER — FENTANYL CITRATE (PF) 100 MCG/2ML IJ SOLN
INTRAMUSCULAR | Status: AC
Start: 2022-03-17 — End: ?
  Filled 2022-03-17: qty 2

## 2022-03-17 MED ORDER — RANOLAZINE ER 500 MG PO TB12
500.0000 mg | ORAL_TABLET | Freq: Two times a day (BID) | ORAL | Status: DC
  Administered 2022-03-17 – 2022-03-18 (×2): 500 mg via ORAL
  Filled 2022-03-17 (×2): qty 1

## 2022-03-17 MED ORDER — SODIUM CHLORIDE 0.9 % WEIGHT BASED INFUSION: 1.0000 mL/kg/h | INTRAVENOUS | Status: DC

## 2022-03-17 MED ORDER — ALLOPURINOL 100 MG PO TABS: 100.0000 mg | ORAL_TABLET | Freq: Every day | ORAL | Status: DC | PRN

## 2022-03-17 MED ORDER — ONDANSETRON 4 MG PO TBDP
4.0000 mg | ORAL_TABLET | Freq: Four times a day (QID) | ORAL | Status: DC | PRN
  Filled 2022-03-17: qty 1

## 2022-03-17 MED ORDER — MIDAZOLAM HCL 2 MG/2ML IJ SOLN
INTRAMUSCULAR | Status: AC
  Filled 2022-03-17: qty 2

## 2022-03-17 MED ORDER — HEPARIN SODIUM (PORCINE) 1000 UNIT/ML IJ SOLN
INTRAMUSCULAR | Status: DC | PRN
  Administered 2022-03-17: 3500 [IU] via INTRAVENOUS

## 2022-03-17 MED ORDER — LABETALOL HCL 5 MG/ML IV SOLN: 10.0000 mg | INTRAVENOUS | Status: AC | PRN

## 2022-03-17 MED ORDER — NITROGLYCERIN 0.4 MG SL SUBL: 0.4000 mg | SUBLINGUAL_TABLET | SUBLINGUAL | Status: DC | PRN

## 2022-03-17 MED ORDER — MELATONIN 3 MG PO TABS: 3.0000 mg | ORAL_TABLET | Freq: Every day | ORAL | Status: DC

## 2022-03-17 MED ORDER — IOHEXOL 350 MG/ML SOLN
INTRAVENOUS | Status: DC | PRN
  Administered 2022-03-17: 35 mL

## 2022-03-17 MED ORDER — HYDRALAZINE HCL 20 MG/ML IJ SOLN: 10.0000 mg | INTRAMUSCULAR | Status: AC | PRN

## 2022-03-17 MED ORDER — LIDOCAINE HCL (PF) 1 % IJ SOLN
INTRAMUSCULAR | Status: DC | PRN
  Administered 2022-03-17: 10 mL

## 2022-03-17 MED ORDER — FENTANYL CITRATE (PF) 100 MCG/2ML IJ SOLN
INTRAMUSCULAR | Status: DC | PRN
  Administered 2022-03-17: 25 ug via INTRAVENOUS

## 2022-03-17 MED ORDER — ACETAMINOPHEN 325 MG PO TABS: 650.0000 mg | ORAL_TABLET | ORAL | Status: DC | PRN

## 2022-03-17 MED ORDER — FINASTERIDE 5 MG PO TABS
5.0000 mg | ORAL_TABLET | Freq: Every day | ORAL | Status: DC
  Administered 2022-03-17 – 2022-03-18 (×2): 5 mg via ORAL
  Filled 2022-03-17 (×2): qty 1

## 2022-03-17 MED ORDER — SODIUM CHLORIDE 0.9 % IV SOLN: INTRAVENOUS | Status: AC

## 2022-03-17 MED ORDER — SODIUM CHLORIDE 0.9% FLUSH: 3.0000 mL | INTRAVENOUS | Status: DC | PRN

## 2022-03-17 MED ORDER — SODIUM CHLORIDE 0.9% FLUSH
3.0000 mL | Freq: Two times a day (BID) | INTRAVENOUS | Status: DC
  Administered 2022-03-17 – 2022-03-18 (×2): 3 mL via INTRAVENOUS

## 2022-03-17 MED ORDER — PANTOPRAZOLE SODIUM 40 MG PO TBEC
40.0000 mg | DELAYED_RELEASE_TABLET | Freq: Every day | ORAL | Status: DC
  Administered 2022-03-17 – 2022-03-18 (×2): 40 mg via ORAL
  Filled 2022-03-17 (×2): qty 1

## 2022-03-17 MED ORDER — ISOSORBIDE MONONITRATE ER 30 MG PO TB24
30.0000 mg | ORAL_TABLET | Freq: Every day | ORAL | Status: DC
Start: 2022-03-17 — End: 2022-03-18
  Administered 2022-03-17 – 2022-03-18 (×2): 30 mg via ORAL
  Filled 2022-03-17 (×2): qty 1

## 2022-03-17 MED ORDER — VERAPAMIL HCL 2.5 MG/ML IV SOLN
INTRAVENOUS | Status: AC
  Filled 2022-03-17: qty 2

## 2022-03-17 MED ORDER — APIXABAN 2.5 MG PO TABS
2.5000 mg | ORAL_TABLET | Freq: Two times a day (BID) | ORAL | Status: DC
  Administered 2022-03-17 – 2022-03-18 (×2): 2.5 mg via ORAL
  Filled 2022-03-17 (×2): qty 1

## 2022-03-17 MED ORDER — BISOPROLOL FUMARATE 5 MG PO TABS
2.5000 mg | ORAL_TABLET | Freq: Every day | ORAL | Status: DC
  Administered 2022-03-17 – 2022-03-18 (×2): 2.5 mg via ORAL
  Filled 2022-03-17 (×2): qty 1

## 2022-03-17 MED ORDER — LIDOCAINE HCL (PF) 1 % IJ SOLN
INTRAMUSCULAR | Status: AC
  Filled 2022-03-17: qty 30

## 2022-03-17 MED ORDER — AMIODARONE HCL 200 MG PO TABS
200.0000 mg | ORAL_TABLET | Freq: Every day | ORAL | Status: DC
  Administered 2022-03-17 – 2022-03-18 (×2): 200 mg via ORAL
  Filled 2022-03-17 (×2): qty 1

## 2022-03-17 MED ORDER — METOCLOPRAMIDE HCL 5 MG PO TABS: 5.0000 mg | ORAL_TABLET | Freq: Four times a day (QID) | ORAL | Status: DC | PRN

## 2022-03-17 MED ORDER — TAMSULOSIN HCL 0.4 MG PO CAPS
0.4000 mg | ORAL_CAPSULE | Freq: Every day | ORAL | Status: DC
  Administered 2022-03-17 – 2022-03-18 (×2): 0.4 mg via ORAL
  Filled 2022-03-17 (×2): qty 1

## 2022-03-17 MED ORDER — ACETAMINOPHEN 325 MG PO TABS: 650.0000 mg | ORAL_TABLET | Freq: Four times a day (QID) | ORAL | Status: DC | PRN

## 2022-03-17 MED ORDER — SODIUM CHLORIDE 0.9% FLUSH
3.0000 mL | INTRAVENOUS | Status: DC | PRN
Start: 2022-03-17 — End: 2022-03-18

## 2022-03-17 MED ORDER — MIDAZOLAM HCL 2 MG/2ML IJ SOLN
INTRAMUSCULAR | Status: DC | PRN
  Administered 2022-03-17: 1 mg via INTRAVENOUS

## 2022-03-17 MED ORDER — HEPARIN (PORCINE) IN NACL 1000-0.9 UT/500ML-% IV SOLN
INTRAVENOUS | Status: AC
  Filled 2022-03-17: qty 1500

## 2022-03-17 MED ORDER — SODIUM CHLORIDE 0.9 % IV SOLN
250.0000 mL | INTRAVENOUS | Status: DC | PRN
  Administered 2022-03-17: 500 mL via INTRAVENOUS

## 2022-03-17 MED ORDER — SODIUM CHLORIDE 0.9 % IV SOLN
250.0000 mL | INTRAVENOUS | Status: DC | PRN
Start: 2022-03-17 — End: 2022-03-18

## 2022-03-17 SURGICAL SUPPLY — 16 items
BAND CMPR LRG ZPHR (HEMOSTASIS) ×1
BAND ZEPHYR COMPRESS 30 LONG (HEMOSTASIS) ×1 IMPLANT
CATH BALLN WEDGE 5F 110CM (CATHETERS) ×1 IMPLANT
CATH INFINITI 5 FR IM (CATHETERS) ×1 IMPLANT
CATH INFINITI 5FR MULTPACK ANG (CATHETERS) ×1 IMPLANT
GLIDESHEATH SLEND A-KIT 6F 22G (SHEATH) ×1 IMPLANT
GUIDEWIRE .025 260CM (WIRE) ×1 IMPLANT
GUIDEWIRE INQWIRE 1.5J.035X260 (WIRE) IMPLANT
INQWIRE 1.5J .035X260CM (WIRE) ×2
KIT HEART LEFT (KITS) ×2 IMPLANT
PACK CARDIAC CATHETERIZATION (CUSTOM PROCEDURE TRAY) ×3 IMPLANT
SHEATH GLIDE SLENDER 4/5FR (SHEATH) ×1 IMPLANT
SHEATH PROBE COVER 6X72 (BAG) ×1 IMPLANT
TRANSDUCER W/STOPCOCK (MISCELLANEOUS) ×2 IMPLANT
TUBING CIL FLEX 10 FLL-RA (TUBING) ×3 IMPLANT
WIRE HI TORQ VERSACORE-J 145CM (WIRE) ×1 IMPLANT

## 2022-03-17 NOTE — Patient Outreach (Signed)
[facility] [facility]) Care Management ? ?03/17/2022 ? ?Ryan [name] ?1941-10-02 ?[id number] ? ? ?Telephone Assessment-Pt hospitalized ? ?RN spoke with spouse Ryan [name]) who indicates pt was admitted into the hospital on yesterday for chest pain and "a lot of swelling". Pending possible catheretization today with CAD.  ? ?RN will alert hospital liaison to follow accordingly. RN will follow up once pt returns home for ongoing case management needs. ? ?[name], RN ?Care Management Coordinator ?[place] ?Main Office [phone]   ?

## 2022-03-17 NOTE — Consult Note (Signed)
? ?  [facility] CM Inpatient Consult ? ? ?03/17/2022 ? ?Angus Palms [place] ?[birth year] ?[id number] ? ?[place] [facility] [ACO] Patient: Medicare ACO Reach ? ?Primary Care Provider:  Ginger Organ., MD [facility],   is an embedded provider with a Chronic Care Management [CCM] team and program, and is listed for the transition of care follow up and appointments. ?Received notification from [place] regarding patient's admission and being active in the [employer] team for complex care management.  ? ?Plan: Will follow patient for ongoing [place] for post hospital needs. ? ?Please contact for further questions, ? ?[name], RN BSN CCM ?[facility] Liaison ? [phone] business mobile phone ?Toll free office [phone]  ?Fax number: [phone] ?[email or web address] ? ? ? ?

## 2022-03-17 NOTE — Progress Notes (Signed)
Pt.still complaining of difficulty voiding & wanted to have foley cath. Inserted.Dr .Virgina Jock was called again & ordered to insert foley cath. Foley cath.fr.#16 inserted under sterile technique with assistance of Conservation officer, nature. ?

## 2022-03-17 NOTE — Progress Notes (Signed)
ANTICOAGULATION CONSULT NOTE ?Pharmacy Consult for heparin ?Indication: Afib ?Brief A/P: aPTT subtherapeutic  Increase Heparin rate ? ?Allergies  ?Allergen Reactions  ? Jardiance [Empagliflozin] Nausea Only and Other (See Comments)  ?  Made patient very sick to the stomach.  ? Ezetimibe Other (See Comments)  ?  Myalgias  ?Other reaction(s): myalgias  ? Ace Inhibitors Other (See Comments)  ?  Other reaction(s): cough  ? Aspirin Other (See Comments)  ?  Other reaction(s): skin rash/easy bruising  ? Atorvastatin Other (See Comments)  ?  Other reaction(s): weakness, fatigue (severe)  ? Ezetimibe-Simvastatin Other (See Comments)  ?  Other reaction(s): myalgias  ? Codeine Nausea And Vomiting  ?    ?Other reaction(s): nausea  ? Unithroid [Levothyroxine Sodium] Other (See Comments)  ?  Caused blurry vision per pt  ? ? ?Patient Measurements: ?Height: '5\' 6"'$  (167.6 cm) ?Weight: 66.2 kg (145 lb 14.4 oz) ?IBW/kg (Calculated) : 63.8 ?Heparin Dosing Weight: 64kg ? ?Vital Signs: ?Temp: 98.1 ?F (36.7 ?C) (03/27 2003) ?Temp Source: Oral (03/27 2003) ?BP: 141/71 (03/27 2003) ?Pulse Rate: 55 (03/27 2003) ? ?Labs: ?Recent Labs  ?  03/16/22 ?2236 03/17/22 ?0243  ?HGB  --  11.9*  ?HCT  --  35.7*  ?PLT  --  193  ?APTT  --  56*  ?HEPARINUNFRC  --  >1.10*  ?CREATININE 1.62*  --   ?TROPONINIHS 45*  --   ? ? ?Estimated Creatinine Clearance: 32.8 mL/min (A) (by C-G formula based on SCr of 1.62 mg/dL (H)). ? ? ?Assessment: ?81 y.o. male with h/o Afib, Eliquis on hold, for heparin ? ?Goal of Therapy:  ?Heparin level 0.3-0.7 units/ml ?aPTT 66-102 seconds ?Monitor platelets by anticoagulation protocol: Yes ?  ?Plan:  ?Increase Heparin 1000 units/hr ?Check aPTT in 8 hours  ? ?Phillis Knack, PharmD, BCPS  ? ?

## 2022-03-17 NOTE — Progress Notes (Addendum)
Patient has cr. 1.62 , assessment clear lungs and edema. Complains of fleeting episodes of chest pain that come and go quickly, on nitro infusion at 10 mcg. Dr. Virgina Jock notified and will evaluate, holding lasix at this time. ?1015 Holding am dose of lasix today per Dr. Bonney Roussel order ?

## 2022-03-17 NOTE — Progress Notes (Signed)
[place].es of pain on his bladder & unable to void. Dr.Patwardhan was called & ordered to do in & out cath.& was done & obtained 1000 cc clear urine output. ?

## 2022-03-17 NOTE — Progress Notes (Signed)
ANTICOAGULATION CONSULT NOTE  ? ?Pharmacy Consult for heparin ?Indication: Chronic unstable angina  ? ?Allergies  ?Allergen Reactions  ? Jardiance [Empagliflozin] Nausea Only and Other (See Comments)  ?  Made patient very sick to the stomach.  ? Ezetimibe Other (See Comments)  ?  Myalgias  ?Other reaction(s): myalgias  ? Ace Inhibitors Other (See Comments)  ?  Other reaction(s): cough  ? Aspirin Other (See Comments)  ?  Other reaction(s): skin rash/easy bruising  ? Atorvastatin Other (See Comments)  ?  Other reaction(s): weakness, fatigue (severe)  ? Ezetimibe-Simvastatin Other (See Comments)  ?  Other reaction(s): myalgias  ? Codeine Nausea And Vomiting  ?    ?Other reaction(s): nausea  ? Unithroid [Levothyroxine Sodium] Other (See Comments)  ?  Caused blurry vision per pt  ? ? ?Patient Measurements: ?Height: '5\' 6"'$  (167.6 cm) ?Weight: 66.2 kg (145 lb 14.4 oz) ?IBW/kg (Calculated) : 63.8 ?Heparin Dosing Weight: 64kg ? ?Vital Signs: ?Temp: 98 ?F (36.7 ?C) (03/28 0813) ?Temp Source: Oral (03/28 0813) ?BP: 115/64 (03/28 0813) ?Pulse Rate: 62 (03/28 0813) ? ?Labs: ?Recent Labs  ?  03/16/22 ?2236 03/17/22 ?0243 03/17/22 ?1103  ?HGB  --  11.9*  --   ?HCT  --  35.7*  --   ?PLT  --  193  --   ?APTT  --  56* 88*  ?HEPARINUNFRC  --  >1.10*  --   ?CREATININE 1.62*  --  1.70*  ?TROPONINIHS 45* 51*  --   ? ? ?Estimated Creatinine Clearance: 31.3 mL/min (A) (by C-G formula based on SCr of 1.7 mg/dL (H)). ? ? ?Assessment: ?81 yo M on apixaban for afib PTA now held for procedure. Patient directly admitted from clinic. Last apixaban dose was 3/27 AM per patient. Pharmacy consulted for heparin.   ? ?APTT this morning is 88; within goal range.  Heparin level remains falsely elevated as expected from recent Eliquis. ? ?Goal of Therapy:  ?Heparin level 0.3-0.7 units/ml ?aPTT 66-102 seconds ?Monitor platelets by anticoagulation protocol: Yes ?  ?Plan:  ?Continue IV heparin at 1000 units/hr. ?Monitor daily heparin level, CBC ?Monitor for  signs/symptoms of bleeding  ?Will f/u plans for heparin after cath lab. ? ?[place] Crane, Pharm D, BCPS, BCCP ?Clinical Pharmacist ? 03/17/2022 12:37 PM  ? ?[facility] pharmacy phone numbers are listed on [email or web address] ? ? ? ?

## 2022-03-17 NOTE — CV Procedure (Signed)
Known occluded vein grafts, prox occluded RCA, not engaged today ?Patent LIMA-LAD ?L:M: Normal ?LAD: Mid occluded ?Ramus: Prox 50% disease ?Lcx: Prox 80% ISR, further 90% disease Av grove Lcx ? ?LVEDP low ? ?Continue medical management. May add Ranexa. ? ?Full report to follow. ? ? ?Nigel Mormon, MD ?Pager: [phone] ?Office: [phone] ? ? ?

## 2022-03-17 NOTE — Progress Notes (Signed)
Mobility Specialist Progress Note  ? ? 03/17/22 1148  ?Mobility  ?Activity Ambulated independently in hallway  ?Level of Assistance Modified independent, requires aide device or extra time  ?Assistive Device Front wheel walker  ?Distance Ambulated (ft) 450 ft  ?Activity Response Tolerated well  ?$Mobility charge 1 Mobility  ? ?During Mobility: 100 HR ?Post-Mobility: 65 HR ? ?Pt received in chair and agreeable. No complaints. Returned to chair with call bell in reach.  ? ?Ryan [name] ?Mobility Specialist  ?  ?

## 2022-03-17 NOTE — Progress Notes (Signed)
Initial Nutrition Assessment ? ?DOCUMENTATION CODES:  ?Not applicable ? ?INTERVENTION:  ?Advance diet to regular with no salt packets on tray after cath procedure due to advanced age and poor intake ?Ensure Enlive po BID, each supplement provides 350 kcal and 20 grams of protein. ?MVI with minerals daily ? ?NUTRITION DIAGNOSIS:  ?Inadequate oral intake related to decreased appetite as evidenced by per patient/family report. ? ?GOAL:  ?Patient will meet greater than or equal to 90% of their needs ? ?MONITOR:  ?PO intake, Supplement acceptance, Diet advancement, Weight trends, I & O's ? ?REASON FOR ASSESSMENT:  ?Malnutrition Screening Tool ?  ? ?ASSESSMENT:  ?81 year old male with hx of HLD, PVD, HTN, GERD, CAD s/p CABG, hx MI, CHF, CKD3 presented to ED from PCP office with chest pain and complaints of constipation. ? ?Pt ambulating with mobility specialist at the time of assessment, visit was brief. Pt reports that his appetite has been decreased for a few months, states that he just doesn't get as hungry as he used to. Reports weight gain due to fluid buildup.  ? ?Noted that pt was made NPO this morning, planned for cath procedure later today. Will monitor for diet advancement and follow-up with physical exam.  ? ?Nutritionally Relevant Medications: ?Scheduled Meds: ? Ensure Enlive  237 mL Oral BID BM  ? furosemide  40 mg Intravenous Q12H  ? pantoprazole  40 mg Oral Daily  ? polyethylene glycol  17 g Oral BID  ? ?PRN Meds: metoCLOPramide, ondansetron ? ?Labs Reviewed: ?Creatinine 1.62 ? ?NUTRITION - FOCUSED PHYSICAL EXAM: ?Defer to follow-up assessment ? ?Diet Order:   ?Diet Order   ? ?       ?  Diet NPO time specified Except for: Sips with Meds  Diet effective now       ?  ? ?  ?  ? ?  ? ? ?EDUCATION NEEDS:  ?No education needs have been identified at this time ? ?Skin:  Skin Assessment: Reviewed RN Assessment (skin tear to the right arm) ? ?Last BM:  3/27 ? ?Height:  ?Ht Readings from Last 1 Encounters:  ?03/16/22  '5\' 6"'$  (1.676 m)  ? ? ?Weight:  ?Wt Readings from Last 1 Encounters:  ?03/16/22 66.2 kg  ? ? ?Ideal Body Weight:  64.5 kg ? ?BMI:  Body mass index is 23.55 kg/m?. ? ?Estimated Nutritional Needs:  ?Kcal:  1600-1800 kcal/d ?Protein:  80-90 g/d ?Fluid:  1.8-2L/d ? ? ?Ranell Patrick, RD, LDN ?Clinical Dietitian ?RD pager # available in [place]  ?After hours/weekend pager # available in [place] ?

## 2022-03-17 NOTE — Interval H&P Note (Signed)
History and Physical Interval Note: ? ?03/17/2022 ?1:33 PM ? ?Ryan [name]  has presented today for surgery, with the diagnosis of heart failure - unstable angina.  The various methods of treatment have been discussed with the patient and family. After consideration of risks, benefits and other options for treatment, the patient has consented to  Procedure(s): ?RIGHT/LEFT HEART CATH AND CORONARY ANGIOGRAPHY (N/A) as a surgical intervention.  The patient's history has been reviewed, patient examined, no change in status, stable for surgery.  I have reviewed the patient's chart and labs.  Questions were answered to the patient's satisfaction.   ? ?2012 Appropriate Use Criteria for Diagnostic Catheterization ?Home / [employer] Test of Interest ?Indication for RHC ?Cardiomyopathies ?Cardiomyopathies (Right and Left Heart Catheterization OR Right Heart Catheterization Alone With/Wit ?Cardiomyopathies ?(Right and Left Heart Catheterization OR ?Right Heart Catheterization Alone With/Without Left Ventriculography and Coronary Angiography) ?Link Here: [email or web address] ?Indication: ? ?Known or suspected cardiomyopathy with or without heart failure ?A (7) Indication: 93; Score 7 ?291 ? ?2012 Appropriate Use Criteria for Diagnostic Catheterization ?Home / Select Test of Interest ?Indication for RHC ?Cardiomyopathies ?Cardiomyopathies (Right and Left Heart Catheterization OR Right Heart Catheterization Alone With/Wit ?Cardiomyopathies ?(Right and Left Heart Catheterization OR ?Right Heart Catheterization Alone With/Without Left Ventriculography and Coronary Angiography) ?Link Here: [email or web address] ?Indication: ? ?Known or suspected cardiomyopathy with or without heart failure ?A (7) Indication: 93; Score 7 ?291 ? ?[place] ? ? ?

## 2022-03-17 NOTE — H&P (Addendum)
Ryan [name] is an 81 y.o. male.   ?Chief Complaint: Chest pain, leg edema ?HPI:  ? ? ?81 year old caucasian male with coronary artery disease status s/p CABG (patent LIMA-LAD, occluded SVG-ramus/OM Y graft), ischemic cardiomyopathy with recovered EF, s/p ICD, mod AS, persistent Afib, PAD s/o aortobifemoral bypass, mod carotid disease, h/o stroke, CKD III ?  ?For the past few days, patient multitude of complaints.  He does report retrosternal chest pain, several times a day, that does seem to improve with sublingual nitroglycerin.  However, in addition, he also has severe constipation, has not passed bowel movement in 4 days, and has associated nausea.  He is also noticed left leg swelling.  In addition, he has noticed tinnitus in both his ears, for which she is supposed to see ear nose and throat. ? ?I saw the patient in the office on 03/16/2022 and recommended direct admission. Patient has diuresed 1.9 L in around 12 hours with IV lasix 40 mg.He had difficulty urinating, reequired in and out and cath anthen indwelling Foley catheter.  ? ?Past Medical History:  ?Diagnosis Date  ? Acquired thrombophilia ([place]) 11/01/2021  ? Arthritis   ? CAD (coronary artery disease)   ? Carotid artery occlusion   ? Cataract   ? 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 CHF (congestive heart failure) ([place])   ? Chronic back pain   ? COVID-19 10/31/2021  ? DVT (deep venous thrombosis) ([place])   ? Dysrhythmia   ? Enlarged prostate   ? takes Rapaflo daily  ? GERD (gastroesophageal reflux disease)   ? occasional  ? History of colon polyps   ? History of gout   ? has colchicine prn  ? History of kidney stones   ? Hyperlipidemia   ? takes Crestor daily  ? Hypertension   ? takes Amlodipine daily  ? Hypothyroidism   ? Myocardial infarction [facility])   ? Peripheral vascular disease ([place])   ? Prolonged QT interval 11/01/2021  ? Pulmonary emboli ([place]) 03/20/2015  ? elevated d-dimer, intermediate V/Q study, atypical chest pain and SOB. Start on Xarelto '20mg'$  BID  for 3 month  ? Rapid atrial fibrillation ([place])   ? Renal insufficiency   ? Shortness of breath dyspnea   ? Urinary frequency   ? Urinary urgency   ? ? ?Past Surgical History:  ?Procedure Laterality Date  ? APPENDECTOMY    ? BACK SURGERY    ? 5 times  ? big toe surgery    ? CARDIAC CATHETERIZATION    ? 2010    dr Acie Fredrickson  ? cataract surgery    ? left eye  ? CHOLECYSTECTOMY N/A 07/27/2016  ? Procedure: LAPAROSCOPIC CHOLECYSTECTOMY;  Surgeon: Mickeal Skinner, MD;  Location: [place];  Service: General;  Laterality: N/A;  ? COLONOSCOPY    ? CORONARY ARTERY BYPASS GRAFT N/A 04/05/2015  ? Procedure: CORONARY ARTERY BYPASS GRAFTING (CABG)X4 LIMA-LAD; SVG-DIAG1-DIAG2; SVG-PD;  Surgeon: [name], MD;  Location: [place];  Service: Open Heart Surgery;  Laterality: N/A;  ? CORONARY/GRAFT ANGIOGRAPHY N/A 04/22/2018  ? Procedure: CORONARY/GRAFT ANGIOGRAPHY;  Surgeon: Martinique, Peter M, MD;  Location: [place] CV LAB;  Service: Cardiovascular;  Laterality: N/A;  ? CYSTOSCOPY    ? ENDARTERECTOMY Left 04/24/2016  ? Procedure: ENDARTERECTOMY LEFT CAROTID;  Surgeon: Mal Misty, MD;  Location: [place];  Service: Vascular;  Laterality: Left;  ? EYE SURGERY    ? FEMORAL ARTERY - POPLITEAL ARTERY BYPASS GRAFT    ?  ICD IMPLANT N/A 12/30/2020  ? Procedure: ICD IMPLANT;  Surgeon: Evans Lance, MD;  Location: [place] CV LAB;  Service: Cardiovascular;  Laterality: N/A;  ? JOINT REPLACEMENT    ? shoulder  ? LEFT HEART CATHETERIZATION WITH CORONARY ANGIOGRAM N/A 04/03/2015  ? Procedure: LEFT HEART CATHETERIZATION WITH CORONARY ANGIOGRAM;  Surgeon: Troy Sine, MD;  Location: [facility] CATH LAB;  Service: Cardiovascular;  Laterality: N/A;  ? LUMBAR LAMINECTOMY  01/06/2013  ? Procedure: MICRODISCECTOMY LUMBAR LAMINECTOMY;  Surgeon: Marybelle Killings, MD;  Location: [place];  Service: Orthopedics;  Laterality: N/A;  L3-4 decompression  ? LUMBAR LAMINECTOMY/DECOMPRESSION MICRODISCECTOMY  02/12/2012  ? Procedure: LUMBAR LAMINECTOMY/DECOMPRESSION  MICRODISCECTOMY;  Surgeon: Floyce Stakes, MD;  Location: [place] NEURO ORS;  Service: Neurosurgery;  Laterality: N/A;  Lumbar four-five laminectomy  ? PATCH ANGIOPLASTY Left 04/24/2016  ? Procedure: LEFT CAROTID ARTERY PATCH ANGIOPLASTY;  Surgeon: Mal Misty, MD;  Location: [place];  Service: Vascular;  Laterality: Left;  ? RIGHT HEART CATH AND CORONARY/GRAFT ANGIOGRAPHY N/A 01/30/2019  ? Procedure: RIGHT HEART CATH AND CORONARY/GRAFT ANGIOGRAPHY;  Surgeon: Troy Sine, MD;  Location: [place] CV LAB;  Service: Cardiovascular;  Laterality: N/A;  ? STERIOD INJECTION Right 01/09/2014  ? Procedure: STEROID INJECTION;  Surgeon: Mcarthur Rossetti, MD;  Location: [place];  Service: Orthopedics;  Laterality: Right;  ? TEE WITHOUT CARDIOVERSION N/A 04/05/2015  ? Procedure: TRANSESOPHAGEAL ECHOCARDIOGRAM (TEE);  Surgeon: [name], MD;  Location: [place];  Service: Open Heart Surgery;  Laterality: N/A;  ? TOTAL HIP ARTHROPLASTY Left 01/09/2014  ? DR Ninfa Linden  ? TOTAL HIP ARTHROPLASTY Left 01/09/2014  ? Procedure: LEFT TOTAL HIP ARTHROPLASTY ANTERIOR APPROACH and Steroid Injection Right hip;  Surgeon: Mcarthur Rossetti, MD;  Location: [place];  Service: Orthopedics;  Laterality: Left;  ? TOTAL HIP ARTHROPLASTY Right 08/15/2019  ? TOTAL HIP ARTHROPLASTY Right 08/15/2019  ? Procedure: RIGHT TOTAL HIP ARTHROPLASTY ANTERIOR APPROACH;  Surgeon: Mcarthur Rossetti, MD;  Location: [place];  Service: Orthopedics;  Laterality: Right;  ? ? ? ?Family History  ?Problem Relation Age of Onset  ? Heart disease Father   ? Heart attack Father   ? Heart disease Sister   ? Hypertension Sister   ? Heart attack Sister   ? Hypertension Mother   ? Diabetes Son   ? ? ?Social History:  reports that he quit smoking about 35 years ago. His smoking use included cigarettes. He has a 25.00 pack-year smoking history. He quit smokeless tobacco use about 12 years ago.  His smokeless tobacco use included chew. He reports that he does not  drink alcohol and does not use drugs. ? ?Allergies:  ?Allergies  ?Allergen Reactions  ? Jardiance [Empagliflozin] Nausea Only and Other (See Comments)  ?  Made patient very sick to the stomach.  ? Ezetimibe Other (See Comments)  ?  Myalgias  ?Other reaction(s): myalgias  ? Ace Inhibitors Other (See Comments)  ?  Other reaction(s): cough  ? Aspirin Other (See Comments)  ?  Other reaction(s): skin rash/easy bruising  ? Atorvastatin Other (See Comments)  ?  Other reaction(s): weakness, fatigue (severe)  ? Ezetimibe-Simvastatin Other (See Comments)  ?  Other reaction(s): myalgias  ? Codeine Nausea And Vomiting  ?    ?Other reaction(s): nausea  ? Unithroid [Levothyroxine Sodium] Other (See Comments)  ?  Caused blurry vision per pt  ? ? ?Review of Systems  ?Constitutional: Positive for malaise/fatigue.  ?HENT:  Positive for ear pain  and tinnitus.   ?Cardiovascular:  Positive for chest pain and leg swelling. Negative for dyspnea on exertion, palpitations and syncope.  ?Gastrointestinal:  Positive for nausea.  ? ? ?Blood pressure (!) 141/71, pulse (!) 55, temperature 98.1 ?F (36.7 ?C), temperature source Oral, resp. rate 16, height '5\' 6"'$  (1.676 m), weight 66.2 kg, SpO2 99 %. Body mass index is 23.55 kg/m?. ? ?Physical Exam ?Vitals and nursing note reviewed.  ?Constitutional:   ?   General: He is not in acute distress. ?Neck:  ?   Vascular: No JVD.  ?Cardiovascular:  ?   Rate and Rhythm: Normal rate. Rhythm irregular.  ?   Heart sounds: Normal heart sounds. No murmur heard. ?Pulmonary:  ?   Effort: Pulmonary effort is normal.  ?   Breath sounds: Normal breath sounds. No wheezing or rales.  ?Musculoskeletal:  ?   Right lower leg: Edema (Trace) present.  ?   Left lower leg: Edema (1+) present.  ? ? ? ?Medications Prior to Admission  ?Medication Sig Dispense Refill  ? acetaminophen (TYLENOL) 325 MG tablet Take 2 tablets (650 mg total) by mouth every 6 (six) hours as needed for mild pain (or Fever >/= 101). 20 tablet 0  ?  allopurinol (ZYLOPRIM) 100 MG tablet Take 100 mg by mouth daily as needed (gout).    ? ALPRAZolam (XANAX) 0.5 MG tablet Take 0.5 mg by mouth 3 (three) times daily as needed for anxiety or sleep.    ? amiodarone (P

## 2022-03-17 NOTE — Progress Notes (Signed)
Patient informed orders written to remove foley catheter. Patient adamantly refused to have foley catheter removed. 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 Patient is due to discharge in the morning if no issues overnight and he stated he will have it removed in am before discharge and not before that.  ?

## 2022-03-18 ENCOUNTER — Other Ambulatory Visit (HOSPITAL_COMMUNITY): Payer: Self-pay

## 2022-03-18 ENCOUNTER — Encounter (HOSPITAL_COMMUNITY): Payer: Self-pay | Admitting: Cardiology

## 2022-03-18 LAB — CBC
HCT: 34.3 % — ABNORMAL LOW (ref 39.0–52.0)
Hemoglobin: 11.1 g/dL — ABNORMAL LOW (ref 13.0–17.0)
MCH: 31.6 pg (ref 26.0–34.0)
MCHC: 32.4 g/dL (ref 30.0–36.0)
MCV: 97.7 fL (ref 80.0–100.0)
Platelets: 194 10*3/uL (ref 150–400)
RBC: 3.51 MIL/uL — ABNORMAL LOW (ref 4.22–5.81)
RDW: 13.5 % (ref 11.5–15.5)
WBC: 5.7 10*3/uL (ref 4.0–10.5)
nRBC: 0 % (ref 0.0–0.2)

## 2022-03-18 LAB — BASIC METABOLIC PANEL
Anion gap: 9 (ref 5–15)
BUN: 14 mg/dL (ref 8–23)
CO2: 24 mmol/L (ref 22–32)
Calcium: 8.1 mg/dL — ABNORMAL LOW (ref 8.9–10.3)
Chloride: 103 mmol/L (ref 98–111)
Creatinine, Ser: 1.59 mg/dL — ABNORMAL HIGH (ref 0.61–1.24)
GFR, Estimated: 44 mL/min — ABNORMAL LOW (ref >60)
Glucose, Bld: 88 mg/dL (ref 70–99)
Potassium: 4.6 mmol/L (ref 3.5–5.1)
Sodium: 136 mmol/L (ref 135–145)

## 2022-03-18 MED ORDER — FUROSEMIDE 20 MG PO TABS
20.0000 mg | ORAL_TABLET | Freq: Every day | ORAL | 1 refills | Status: DC
  Filled 2022-03-18: qty 30, 30d supply, fill #0

## 2022-03-18 MED ORDER — APIXABAN 2.5 MG PO TABS
2.5000 mg | ORAL_TABLET | Freq: Two times a day (BID) | ORAL | 5 refills | Status: DC
  Filled 2022-03-18: qty 60, 30d supply, fill #0

## 2022-03-18 MED ORDER — RANOLAZINE ER 500 MG PO TB12
500.0000 mg | ORAL_TABLET | Freq: Two times a day (BID) | ORAL | 2 refills | Status: DC
  Filled 2022-03-18: qty 60, 30d supply, fill #0

## 2022-03-18 NOTE — TOC Initial Note (Signed)
Transition of Care (TOC) - Initial/Assessment Note  ? ? ?Patient Details  ?Name: Ryan [name] ?MRN: [id number] ?Date of Birth: 08-06-1941 ? ?Transition of Care (TOC) CM/SW Contact:    ?Graves-Bigelow, Ocie Cornfield, RN ?Phone Number: ?03/18/2022, 10:54 AM ? ?Clinical Narrative:  Risk for readmission assessment completed. PTA patient was independent from home with spouse. Patient states he gets to appointments without any issues and he has transportation. Patient is followed in the community via [facility]. No further home needs identified at the time of visit. Spouse to transport patient home via private vehicle.           ? ? ?Expected Discharge Plan: Home/Self Care ?Barriers to Discharge: No Barriers Identified ? ? ?Patient Goals and CMS Choice ?Patient states their goals for this hospitalization and ongoing recovery are:: to return home ?  ?Choice offered to / list presented to : NA ? ?Expected Discharge Plan and Services ?Expected Discharge Plan: Home/Self Care ?In-house Referral: NA ?Discharge Planning Services: CM Consult ?Post Acute Care Choice: NA ?Living arrangements for the past 2 months: [place] ?Expected Discharge Date: 03/18/22               ?  ?DME Agency: NA ?  ?  ?  ?HH Arranged: NA ?  ?  ?  ?  ? ?Prior Living Arrangements/Services ?Living arrangements for the past 2 months: [place] ?Lives with:: Spouse ?Patient language and need for interpreter reviewed:: Yes ?Do you feel safe going back to the place where you live?: Yes      ?Need for Family Participation in Patient Care: No (Comment) ?Care giver support system in place?: No (comment) ?  ?Criminal Activity/Legal Involvement Pertinent to Current Situation/Hospitalization: No - Comment as needed ? ?Activities of Daily Living ?Home Assistive Devices/Equipment: Dentures (specify type), Walker (specify type), Cane (specify quad or straight) ?ADL Screening (condition at time of admission) ?Patient's cognitive ability adequate to safely  complete daily activities?: Yes ?Is the patient deaf or have difficulty hearing?: No ?Does the patient have difficulty seeing, even when wearing glasses/contacts?: No ?Does the patient have difficulty concentrating, remembering, or making decisions?: No ?Patient able to express need for assistance with ADLs?: Yes ?Does the patient have difficulty dressing or bathing?: No ?Independently performs ADLs?: Yes (appropriate for developmental age) ?Does the patient have difficulty walking or climbing stairs?: No ?Weakness of Legs: Both ?Weakness of Arms/Hands: None ? ?Permission Sought/Granted ?Permission sought to share information with : Family Supports, Case Manager ?  ?   ?   ?   ?   ? ?Emotional Assessment ?Appearance:: Appears stated age ?Attitude/Demeanor/Rapport: Engaged ?Affect (typically observed): Appropriate ?Orientation: : Oriented to Situation, Oriented to  Time, Oriented to Place, Oriented to Self ?Alcohol / Substance Use: Not Applicable ?Psych Involvement: No (comment) ? ?Admission diagnosis:  Unstable angina ([place]) [I20.0] ?Patient Active Problem List  ? Diagnosis Date Noted  ? Unstable angina ([place]) 03/16/2022  ? HFrEF (heart failure with reduced ejection fraction) ([place]) 12/31/2021  ? Acute heart failure with preserved ejection fraction ([place]) 12/26/2021  ? Complete tear of right rotator cuff 11/21/2021  ? VT (ventricular tachycardia) 11/18/2021  ? Orthostatic hypotension 11/12/2021  ? Nausea 11/12/2021  ? Aortic stenosis 11/11/2021  ? Right lower lobe pneumonia 11/01/2021  ? Prolonged QT interval 11/01/2021  ? Acquired thrombophilia ([place]) 11/01/2021  ? Aortic atherosclerosis ([place]) 11/01/2021  ? Intractable nausea and vomiting 09/10/2021  ? Hypoxemia   ? Status post implantation of automatic cardioverter/defibrillator (AICD) 03/22/2021  ?  Ventricular tachycardia 12/28/2020  ? (HFpEF) heart failure with preserved ejection fraction ([place]) 12/28/2020  ? NSTEMI (non-ST elevated myocardial infarction) ([place])   ?  Acute respiratory failure with hypoxia ([place])   ? S/P total hip arthroplasty 08/16/2019  ? Unilateral primary osteoarthritis, right hip 07/18/2019  ? Persistent atrial fibrillation ([place]) 01/27/2019  ? Vertigo 01/27/2019  ? History of pneumonia 01/27/2019  ? Stage 3a chronic kidney disease (CKD) ([place]) 01/27/2019  ? Chest pain 02/07/2018  ? Elevated troponin 03/23/2017  ? Sigmoid diverticulitis 03/23/2017  ? Wrist pain, acute, right   ? Subclinical hypothyroidism   ? Dyspnea 02/26/2017  ? Shoulder blade pain 02/26/2017  ? SOB (shortness of breath) 02/26/2017  ? Chronic right shoulder pain   ? Hyperlipidemia LDL goal <70 01/20/2017  ? Chronic cholecystitis 07/27/2016  ? PAD (peripheral artery disease) ([place]) 07/09/2015  ? S/P CABG x 4 04/05/2015  ? Atypical chest pain 03/20/2015  ? Carotid artery disease ([place]) 10/09/2014  ? PVD (peripheral vascular disease) ([place]) 10/09/2014  ? HTN (hypertension) 05/30/2014  ? Arthritis of left hip 01/09/2014  ? Status post THR (total hip replacement) 01/09/2014  ? Spinal stenosis, lumbar 01/06/2013  ?  Class: Diagnosis of  ? Coronary artery disease 01/05/2013  ? Atherosclerosis of native artery of extremity with intermittent claudication ([place]) 10/18/2012  ? ?PCP:  Ginger Organ., MD ?Pharmacy:   ?[id number] - [place], [place] ?[place] ?[place] [address] ?Phone: [phone] Fax: [phone] ? ?[facility] ?[address] ?[place] [address] ?Phone: [phone] Fax: [phone] ? ? ?  ? ?Readmission Risk Interventions ? ?  03/18/2022  ? 10:52 AM 11/19/2021  ?  2:42 PM  ?Readmission Risk Prevention Plan  ?Transportation Screening Complete Complete  ?PCP or Specialist Appt within 3-5 Days  Complete  ?[place] or Home Care Consult  Complete  ?Social Work Consult for [place] Planning/Counseling  Complete  ?Palliative Care Screening  Not Applicable  ?Medication Review Press photographer) Complete  Complete  ?PCP or Specialist appointment within 3-5 days of discharge Complete   ?[place] or Home Care Consult Complete   ?SW Recovery Care/Counseling Consult Complete   ?Palliative Care Screening Not Applicable   ?[place] Not Applicable   ? ? ? ?

## 2022-03-18 NOTE — Discharge Summary (Signed)
Physician Discharge Summary  ?Patient ID: ?Ryan [name] ?MRN: [id number] ?DOB/AGE: 81-Aug-1942 81 y.o. ? ?Admit date: 03/16/2022 ?Discharge date: 03/18/2022 ? ?Primary Discharge Diagnosis: ?HFpEF ?CAD ? ?Secondary Discharge Diagnosis: ?Mod AS ?Persistent Afib ? ? ?Hospital Course:  ? ?81 year old caucasian male with coronary artery disease status s/p CABG (patent LIMA-LAD, occluded SVG-ramus/OM Y graft), ischemic cardiomyopathy with recovered EF, s/p ICD, mod AS, persistent Afib, PAD s/o aortobifemoral bypass, mod carotid disease, h/o stroke, CKD III, was admitted with chest pain, leg edema. ? ?Leg edema improved with diuresis. RHC in farct showed that he as rather dry, see details below. Coronary and bypass graft angiogram showed severe native vessel CAD, patent LIMA-LAD. Medical management was recommended. Eliquis dose was reduced due to Cr >1.5. Chest pain improved with Raenxa.  ? ? ?Discharge Exam: ?Blood pressure (!) 96/49, pulse 64, temperature 99.1 ?F (37.3 ?C), temperature source Oral, resp. rate 17, height '5\' 6"'$  (1.676 m), weight 66.2 kg, SpO2 98 %.  ? ?Physical Exam ?Vitals and nursing note reviewed.  ?Constitutional:   ?   General: He is not in acute distress. ?Neck:  ?   Vascular: No JVD.  ?Cardiovascular:  ?   Rate and Rhythm: Normal rate. Rhythm irregular.  ?   Heart sounds: Normal heart sounds. No murmur heard. ?Pulmonary:  ?   Effort: Pulmonary effort is normal.  ?   Breath sounds: Normal breath sounds. No wheezing or rales.  ?Musculoskeletal:  ?   Right lower leg: No edema.  ?   Left lower leg: No edema.  ? ? ? ?Significant Diagnostic Studies: ? ?EKG 03/18/2022: ?Atrial fibrillation with slow ventricular response ?Low voltage QRS ? ?RHC/LHC, coronary and bypass graft angiography 03/18/2022:  ?LM: No significant disease ?LAD: Prox severe disease, followed by mid vessel occlusion ?          Distal apical LAD diffuse 70% disease ?          Left-to-right epicardial fills mid to distal RCA     ?Ramus:  Ostial 80% stenosis ?LCx: Prox 80% calcific stenosis (worsened since 2020), AV grove LCx 80% stenosis  ?RCA: Not engaged (Known prox occlusion) ?LIMA-LAD: Patent, feeds into mid LAD ?  ?RA: 2 mmHg ?RV: 27/0 mmHg ?PA: 25/7 mmHg ?Unable to obtain wedge position ?  ?LV: 100/2 mmHg, LVEDP 7 mmHg ?AO: 91/42 mmHg ?  ?CO: 4.7 L/min ?CI: 2.7 L/min/m2 ?  ?Severe native vessel CAD ?Patent LIMA-LAD. Occluded vein grafts ?  ?Poor targets for PCI. Not a surgical candidate. ?Recommend adding Ranexa. ?  ? ? ?FOLLOW UP PLANS AND APPOINTMENTS ?Discharge Instructions   ? ? Diet - low sodium heart healthy   Complete by: As directed ?  ? Increase activity slowly   Complete by: As directed ?  ? No dressing needed   Complete by: As directed ?  ? ?  ? ?Allergies as of 03/18/2022   ? ?   Reactions  ? Jardiance [empagliflozin] Nausea Only, Other (See Comments)  ? Made patient very sick to the stomach.  ? Ezetimibe Other (See Comments)  ? Myalgias  ?Other reaction(s): myalgias  ? Ace Inhibitors Other (See Comments)  ? Other reaction(s): cough  ? Aspirin Other (See Comments)  ? Other reaction(s): skin rash/easy bruising  ? Atorvastatin Other (See Comments)  ? Other reaction(s): weakness, fatigue (severe)  ? Ezetimibe-simvastatin Other (See Comments)  ? Other reaction(s): myalgias  ? Codeine Nausea And Vomiting  ?   ?Other reaction(s): nausea  ? Unithroid [  levothyroxine Sodium] Other (See Comments)  ? Caused blurry vision per pt  ? ?  ? ?  ?Medication List  ?  ? ?STOP taking these medications   ? ?spironolactone 25 MG tablet ?Commonly known as: ALDACTONE ?  ?torsemide 20 MG tablet ?Commonly known as: DEMADEX ?  ? ?  ? ?TAKE these medications   ? ?acetaminophen 325 MG tablet ?Commonly known as: TYLENOL ?Take 2 tablets (650 mg total) by mouth every 6 (six) hours as needed for mild pain (or Fever >/= 101). ?  ?allopurinol 100 MG tablet ?Commonly known as: ZYLOPRIM ?Take 100 mg by mouth daily as needed (gout). ?  ?ALPRAZolam 0.5 MG tablet ?Commonly  known as: Duanne Moron ?Take 0.5 mg by mouth 3 (three) times daily as needed for anxiety or sleep. ?  ?amiodarone 200 MG tablet ?Commonly known as: PACERONE ?Take 1 tablet (200 mg total) by mouth daily. ?  ?bisoprolol 5 MG tablet ?Commonly known as: ZEBETA ?Take 0.5 tablets (2.5 mg total) by mouth daily. ?  ?Eliquis 2.5 MG Tabs tablet ?Generic drug: apixaban ?Take 1 tablet (2.5 mg total) by mouth 2 (two) times daily. ?What changed:  ?medication strength ?how much to take ?  ?finasteride 5 MG tablet ?Commonly known as: PROSCAR ?Take 5 mg by mouth daily. ?  ?furosemide 20 MG tablet ?Commonly known as: Lasix ?Take 1 tablet (20 mg total) by mouth daily. ?  ?isosorbide mononitrate 30 MG 24 hr tablet ?Commonly known as: IMDUR ?Take 1 tablet (30 mg total) by mouth daily. ?  ?meclizine 12.5 MG tablet ?Commonly known as: ANTIVERT ?TAKE ONE TABLET BY MOUTH THREE TIMES A DAY AS NEEDED FOR DIZZINESS ?What changed: See the new instructions. ?  ?metoCLOPramide 5 MG tablet ?Commonly known as: REGLAN ?Take 1 tablet (5 mg total) by mouth every 6 (six) hours as needed for nausea. ?  ?nitroGLYCERIN 0.4 MG SL tablet ?Commonly known as: NITROSTAT ?Place 1 tablet (0.4 mg total) under the tongue every 5 (five) minutes as needed for chest pain. ?  ?ondansetron 4 MG disintegrating tablet ?Commonly known as: ZOFRAN-ODT ?Take 4 mg by mouth every 6 (six) hours as needed for nausea/vomiting. ?  ?pantoprazole 40 MG tablet ?Commonly known as: PROTONIX ?Take 1 tablet (40 mg total) by mouth daily. ?  ?polyethylene glycol 17 g packet ?Commonly known as: MIRALAX / GLYCOLAX ?Take 17 g by mouth 2 (two) times daily. ?  ?ranolazine 500 MG 12 hr tablet ?Commonly known as: RANEXA ?Take 1 tablet (500 mg total) by mouth 2 (two) times daily. ?  ?Repatha SureClick 099 MG/ML Soaj ?Generic drug: Evolocumab ?INJECT 1 PEN INTO THE SKIN EVERY 14 DAYS. ?What changed: See the new instructions. ?  ?tamsulosin 0.4 MG Caps capsule ?Commonly known as: FLOMAX ?Take 0.4 mg by  mouth daily. ?  ? ?  ? ?  ?  ? ? ?  ?Discharge Care Instructions  ?(From admission, onward)  ?  ? ? ?  ? ?  Start     Ordered  ? 03/18/22 0000  No dressing needed       ? 03/18/22 0640  ? ?  ?  ? ?  ? ? Follow-up Information   ? ? Ebb Carelock J, MD Follow up in 2 week(s).   ?Specialties: Cardiology, Radiology ?Why: Will call ?Contact information: ?[address] ?[address] ?[place] [address] ?[phone] ? ? ?  ?  ? ?  ?  ? ?  ? ? ? ? ?Nigel Mormon, MD ?  Pager: [phone] ?Office: [phone] ? ? ?

## 2022-03-19 MED FILL — Heparin Sod (Porcine)-NaCl IV Soln 1000 Unit/500ML-0.9%: INTRAVENOUS | Qty: 1500 | Status: AC

## 2022-03-20 DIAGNOSIS — I130 Hypertensive heart and chronic kidney disease with heart failure and stage 1 through stage 4 chronic kidney disease, or unspecified chronic kidney disease: Secondary | ICD-10-CM | POA: Diagnosis not present

## 2022-03-20 DIAGNOSIS — E785 Hyperlipidemia, unspecified: Secondary | ICD-10-CM | POA: Diagnosis not present

## 2022-03-20 DIAGNOSIS — N1831 Chronic kidney disease, stage 3a: Secondary | ICD-10-CM | POA: Diagnosis not present

## 2022-03-20 DIAGNOSIS — I5022 Chronic systolic (congestive) heart failure: Secondary | ICD-10-CM | POA: Diagnosis not present

## 2022-03-23 ENCOUNTER — Ambulatory Visit: Payer: Medicare Other | Admitting: Cardiology

## 2022-03-23 ENCOUNTER — Encounter: Payer: Self-pay | Admitting: Cardiology

## 2022-03-23 VITALS — BP 129/64 | HR 67 | Temp 98.0°F | Resp 17 | Ht 66.0 in | Wt 148.0 lb

## 2022-03-23 DIAGNOSIS — N401 Enlarged prostate with lower urinary tract symptoms: Secondary | ICD-10-CM | POA: Diagnosis not present

## 2022-03-23 DIAGNOSIS — R3912 Poor urinary stream: Secondary | ICD-10-CM | POA: Diagnosis not present

## 2022-03-23 DIAGNOSIS — R972 Elevated prostate specific antigen [PSA]: Secondary | ICD-10-CM | POA: Diagnosis not present

## 2022-03-23 DIAGNOSIS — I5032 Chronic diastolic (congestive) heart failure: Secondary | ICD-10-CM | POA: Diagnosis not present

## 2022-03-23 DIAGNOSIS — I25118 Atherosclerotic heart disease of native coronary artery with other forms of angina pectoris: Secondary | ICD-10-CM | POA: Diagnosis not present

## 2022-03-23 DIAGNOSIS — I4819 Other persistent atrial fibrillation: Secondary | ICD-10-CM | POA: Diagnosis not present

## 2022-03-23 NOTE — Progress Notes (Signed)
? ? ?Patient referred by Ginger Organ., MD for chest pain, shortness of breath ? ?Subjective:  ? ?Ryan [name], male    DOB: 14-Jun-1941, 81 y.o.   MRN: [id number] ? ? ?Chief Complaint  ?Patient presents with  ? Hospitalization Follow-up  ? Congestive Heart Failure  ?  2 Week  ? ? ? ?HPI ? ?81 year old caucasian male with coronary artery disease status s/p CABG (patent LIMA-LAD, occluded SVG-ramus/OM Y graft), ischemic cardiomyopathy with recovered EF, s/p ICD, mod AS, persistent Afib, PAD s/o aortobifemoral bypass, mod carotid disease, h/o stroke, CKD III ? ?Patient was hospitalized in 02/2022 with chest pain, shortness of breath, leg edema. He was diuresed well with improvement in dyspnea., resolution of leg edema. Angiogram showed severe native vessel  disease, patent LIMA-LAD. I started him on Ranexa. ? ?He still has episodes of chest pain from time to time. He asks me if we could perform coronary intervention. ? ?On a separate note, he has had difficulty urinating. His urologist Dr. Abner Greenspan has offered to do a procedure in 2-3 weeks, likely under general anesthesia (according to the patient).   ? ?Current Outpatient Medications:  ?  acetaminophen (TYLENOL) 325 MG tablet, Take 2 tablets (650 mg total) by mouth every 6 (six) hours as needed for mild pain (or Fever >/= 101)., Disp: 20 tablet, Rfl: 0 ?  allopurinol (ZYLOPRIM) 100 MG tablet, Take 100 mg by mouth daily as needed (gout)., Disp: , Rfl:  ?  ALPRAZolam (XANAX) 0.5 MG tablet, Take 0.5 mg by mouth 3 (three) times daily as needed for anxiety or sleep., Disp: , Rfl:  ?  amiodarone (PACERONE) 200 MG tablet, Take 1 tablet (200 mg total) by mouth daily., Disp: 90 tablet, Rfl: 1 ?  apixaban (ELIQUIS) 2.5 MG TABS tablet, Take 1 tablet (2.5 mg total) by mouth 2 (two) times daily., Disp: 60 tablet, Rfl: 5 ?  bisoprolol (ZEBETA) 5 MG tablet, Take 0.5 tablets (2.5 mg total) by mouth daily., Disp: 45 tablet, Rfl: 3 ?  finasteride (PROSCAR) 5 MG tablet, Take 5 mg  by mouth daily., Disp: , Rfl:  ?  furosemide (LASIX) 20 MG tablet, Take 1 tablet (20 mg total) by mouth daily., Disp: 30 tablet, Rfl: 1 ?  isosorbide mononitrate (IMDUR) 30 MG 24 hr tablet, Take 1 tablet (30 mg total) by mouth daily., Disp: 30 tablet, Rfl: 2 ?  meclizine (ANTIVERT) 12.5 MG tablet, TAKE ONE TABLET BY MOUTH THREE TIMES A DAY AS NEEDED FOR DIZZINESS (Patient taking differently: Take 12.5 mg by mouth 3 (three) times daily as needed for dizziness.), Disp: 60 tablet, Rfl: 1 ?  metoCLOPramide (REGLAN) 5 MG tablet, Take 1 tablet (5 mg total) by mouth every 6 (six) hours as needed for nausea., Disp: 15 tablet, Rfl: 0 ?  nitroGLYCERIN (NITROSTAT) 0.4 MG SL tablet, Place 1 tablet (0.4 mg total) under the tongue every 5 (five) minutes as needed for chest pain., Disp: 25 tablet, Rfl: 3 ?  pantoprazole (PROTONIX) 40 MG tablet, Take 1 tablet (40 mg total) by mouth daily., Disp: 30 tablet, Rfl: 11 ?  polyethylene glycol (MIRALAX / GLYCOLAX) 17 g packet, Take 17 g by mouth 2 (two) times daily., Disp: , Rfl:  ?  ranolazine (RANEXA) 500 MG 12 hr tablet, Take 1 tablet (500 mg total) by mouth 2 (two) times daily., Disp: 60 tablet, Rfl: 2 ?  REPATHA SURECLICK 280 MG/ML SOAJ, INJECT 1 PEN INTO THE SKIN EVERY 14 DAYS. (Patient taking differently:  Inject 140 mg into the skin every 14 (fourteen) days.), Disp: 2 mL, Rfl: 11 ?  tamsulosin (FLOMAX) 0.4 MG CAPS capsule, Take 0.4 mg by mouth daily., Disp: , Rfl:  ? ? ? ?Cardiovascular and other pertinent studies: ? ?EKG 03/16/2022: ?Atrial fibrillation 55 bpm  ?Diffuse nonspecific T-abnormality  ?Low voltage ?Old anteroseptal infarct ? ? ?Echocardiogram 12/27/2021: ? 1. Left ventricular ejection fraction, by estimation, is 50 to 55%. The  ?left ventricle has low normal function. The left ventricle demonstrates  ?regional wall motion abnormalities (see scoring diagram/findings for  ?description). Diastolic function not  ?assessed due to Afib. There is severe hypokinesis of the left  ventricular,  ?basal anteroseptal wall.  ? 2. Right ventricular systolic function is mildly reduced. The right  ?ventricular size is normal. There is mildly elevated pulmonary artery  ?systolic pressure.  ? 3. Left atrial size was severely dilated.  ? 4. Right atrial size was moderately dilated.  ? 5. The mitral valve is grossly normal. Mild mitral valve regurgitation.  ? 6. Mild tricuspid regurgitation. peak RA-RV gradient 33 mmHg. . Tricuspid  ?valve regurgitation is mild.  ? 7. Vmax 2.3 m/sec, mean PG 13 mmHg. AVA by continuity equation is 0.9  ?cm2, with dimensionless index of 0.24. Small LV cavity with LV SV index of  ?20 cc/m2. Likely paradoxically low flow low gradient severe aortic  ?stenosis. . The aortic valve is  ?tricuspid. There is moderate calcification of the aortic valve.  ? 8. Overall, LVEF assessment remains difficult. No significant change  ?compared to previous study in 08/2021.  ? ?Coronary and bypass graft angiography 01/2019: ?Severe native coronary artery disease with diffuse proximal to mid LAD stenosis with diffusely diseased native LAD proximally and mid with competitive filling from the LIMA graft; 90% proximal stenosis in the ramus immediate vessel, diffuse stenosis of 60 to 70% in the proximal circumflex with diffuse 80% distal stenosis and total occlusion of the AV groove after marginal vessel with total occlusion of the proximal native RCA.  There is extensive collateralization to the distal RCA both via the circumflex vessel as well as via the LIMA to LAD and distal LAD. ?  ?Patent LIMA graft supplying the mid LAD ?  ?Previously documented old occlusion of the previous Y graft which had supplied the ramus and marginal vessel in the vein graft which had supplied the PDA. ?  ?Low right heart pressures with mean PA pressure at 19 mmHg. ?  ?RECOMMENDATION: ?Increase medical therapy.  The patient has only been on low-dose anti-ischemic medical regimen consisting of bisoprolol 2.5 mg as well  as isosorbide 60 mg.  Recommend initiation of amlodipine 5 mg.  Also recommend consideration of Ranexa 500 mg twice a day.  The patient is on rosuvastatin.  However if the patient is to be on atorvastatin, the maximum recommended dose of atorvastatin is 40 mg if the patient is on ranolazine. ? ? ?Echocardiogram 08/28/2021: ?1. Left ventricular ejection fraction, by estimation, is 50 to 55%. The left ventricle has low normal function. The left ventricle demonstrates regional wall motion abnormalities. Basal anterior/anteroseptal hypokinesis. There is severe asymmetric left ?ventricular hypertrophy of the basal-septal segment. Left ventricular diastolic parameters are indeterminate. ?2. Right ventricular systolic function is moderately reduced. The right ventricular size is normal. There is normal pulmonary artery systolic pressure. ?3. The mitral valve is normal in structure. Trivial mitral valve regurgitation. No evidence of mitral stenosis. ?4. The inferior vena cava is normal in size with greater than 50%  respiratory variability, suggesting right atrial pressure of 3 mmHg. ?5. The aortic valve is calcified. There is moderate calcification of the aortic valve. Aortic valve regurgitation is not visualized. Moderate aortic valve stenosis. Mean gradient only 65mHg, but AVA 1.0 cm^2 and DI 0.35, suspect paradoxical low flow low gradient moderate AS given low SV index (21cc/m^2) ?6. Small mobile echodensity adjacent to RV pacemaker lead (seen on image 14), could represent fibrinous strand or small adherent thrombus; if clinical concern for infection would check blood cultures ? ? ?Recent labs: ?01/07/2022: ?Glucose 102, BUN/Cr 23/1.41. EGFR 50. Na/K 137/4.9.  ?BNP 599 ? ?12/31/2021: ?BNP 592 ? ?12/28/2021: ?Glucose 91, BUN/Cr 21/1.54. EGFR 45. Na/K 138/3.7.  ?H/H 13/40. MCV 97. Platelets 258 ? ? Latest Reference Range & Units 12/23/21 14:57 12/25/21 15:11 12/25/21 20:21 12/26/21 12:03 12/26/21 17:54  ?B Natriuretic Peptide  0.0 - 100.0 pg/mL 1,115.7 (H) 864.7 (H)     ?Troponin I (High Sensitivity) <18 ng/L 42 (H) 41 (H) 41 (H) 41 (H) 62 (H)  ?(H): Data is abnormally high ? ?12/23/2021: ?Glucose 106, BUN/Cr 17/1.48. EGFR 48. N

## 2022-03-24 ENCOUNTER — Encounter: Payer: Self-pay | Admitting: Cardiovascular Disease

## 2022-03-24 NOTE — Telephone Encounter (Signed)
error 

## 2022-03-26 ENCOUNTER — Telehealth (HOSPITAL_COMMUNITY): Payer: Self-pay

## 2022-03-26 ENCOUNTER — Other Ambulatory Visit (HOSPITAL_COMMUNITY): Payer: Self-pay

## 2022-03-26 NOTE — Telephone Encounter (Signed)
Pharmacy Transitions of Care Follow-up Telephone Call ? ?Date of discharge: 03/18/2022  ?Discharge Diagnosis: unstable angina ? ?How have you been since you were released from the hospital? Patient reports doing fine. ? ?Medication changes made at discharge: ?START taking: ?furosemide (Lasix)  ?ranolazine (RANEXA)  ?CHANGE how you take: ?Eliquis (apixaban)  ?STOP taking: ?spironolactone 25 MG tablet (ALDACTONE)  ?torsemide 20 MG tablet (DEMADEX)  ? ?Medication changes verified by the patient? Yes  ?  ? ?Medication Accessibility: ?[facility] [address] ?Was the patient provided with refills on discharged medications? yes  ? ?Have all prescriptions been transferred from [facility] to home pharmacy? Yes  ? ?Is the patient able to afford medications? Yes  ? ?Medication Review: ?  ?APIXABAN (ELIQUIS)  ?- Patient is taking Eliquis 2.5 mg BID. ?- Discussed importance of taking medication around the same time everyday  ?- Advised patient of medications to avoid (NSAIDs, ASA)  ?- Educated that Tylenol (acetaminophen) will be the preferred analgesic to prevent risk of bleeding  ?- Emphasized importance of monitoring for signs and symptoms of bleeding (abnormal bruising, prolonged bleeding, nose bleeds, bleeding from gums, discolored urine, black tarry stools)  ?- Advised patient to alert all providers of anticoagulation therapy prior to starting a new medication or having a procedure  ? ? ?Follow-up Appointments: ? ?[facility] f/u appt confirmed?  Scheduled to see Vernell Leep on 04/22/2022 @ 8:35am.  ? ?If their condition worsens, is the pt aware to call PCP or go to the Emergency Dept.? yes ? ?Final Patient Assessment: ?-Pt is doing well.  ?-Pt verbalized understanding of Eliquis.  ?-Declined patient education ?-Pt has post discharge appointment and refill [facility] to [facility] [address]. ? ? ?

## 2022-03-27 DIAGNOSIS — H9313 Tinnitus, bilateral: Secondary | ICD-10-CM | POA: Diagnosis not present

## 2022-03-27 DIAGNOSIS — H903 Sensorineural hearing loss, bilateral: Secondary | ICD-10-CM | POA: Diagnosis not present

## 2022-03-27 DIAGNOSIS — R42 Dizziness and giddiness: Secondary | ICD-10-CM | POA: Diagnosis not present

## 2022-03-27 DIAGNOSIS — R1314 Dysphagia, pharyngoesophageal phase: Secondary | ICD-10-CM | POA: Diagnosis not present

## 2022-03-27 DIAGNOSIS — H9113 Presbycusis, bilateral: Secondary | ICD-10-CM | POA: Diagnosis not present

## 2022-03-30 ENCOUNTER — Other Ambulatory Visit: Payer: Self-pay | Admitting: Otolaryngology

## 2022-03-30 DIAGNOSIS — R1314 Dysphagia, pharyngoesophageal phase: Secondary | ICD-10-CM

## 2022-04-01 ENCOUNTER — Ambulatory Visit (INDEPENDENT_AMBULATORY_CARE_PROVIDER_SITE_OTHER): Payer: Medicare Other

## 2022-04-01 DIAGNOSIS — I472 Ventricular tachycardia, unspecified: Secondary | ICD-10-CM

## 2022-04-01 LAB — CUP PACEART REMOTE DEVICE CHECK
Battery Remaining Longevity: 180 mo
Battery Remaining Percentage: 100 %
Brady Statistic RV Percent Paced: 5 %
Date Time Interrogation Session: 20230412071700
HighPow Impedance: 59 Ohm
Implantable Lead Implant Date: 20220110
Implantable Lead Location: 753860
Implantable Lead Model: 137
Implantable Lead Serial Number: 301176
Implantable Pulse Generator Implant Date: 20220110
Lead Channel Impedance Value: 485 Ohm
Lead Channel Setting Pacing Amplitude: 2.5 V
Lead Channel Setting Pacing Pulse Width: 0.4 ms
Lead Channel Setting Sensing Sensitivity: 0.5 mV
Pulse Gen Serial Number: 211464

## 2022-04-03 ENCOUNTER — Other Ambulatory Visit (HOSPITAL_COMMUNITY): Payer: Self-pay

## 2022-04-06 ENCOUNTER — Other Ambulatory Visit: Payer: Self-pay

## 2022-04-06 DIAGNOSIS — I4819 Other persistent atrial fibrillation: Secondary | ICD-10-CM

## 2022-04-06 DIAGNOSIS — I5032 Chronic diastolic (congestive) heart failure: Secondary | ICD-10-CM

## 2022-04-06 DIAGNOSIS — I25118 Atherosclerotic heart disease of native coronary artery with other forms of angina pectoris: Secondary | ICD-10-CM

## 2022-04-06 DIAGNOSIS — I5020 Unspecified systolic (congestive) heart failure: Secondary | ICD-10-CM

## 2022-04-07 ENCOUNTER — Encounter: Payer: Self-pay | Admitting: Physician Assistant

## 2022-04-07 ENCOUNTER — Ambulatory Visit (INDEPENDENT_AMBULATORY_CARE_PROVIDER_SITE_OTHER): Payer: Medicare Other

## 2022-04-07 ENCOUNTER — Telehealth: Payer: Self-pay | Admitting: Orthopaedic Surgery

## 2022-04-07 ENCOUNTER — Ambulatory Visit (INDEPENDENT_AMBULATORY_CARE_PROVIDER_SITE_OTHER): Payer: Medicare Other | Admitting: Physician Assistant

## 2022-04-07 DIAGNOSIS — M25551 Pain in right hip: Secondary | ICD-10-CM | POA: Diagnosis not present

## 2022-04-07 DIAGNOSIS — M545 Low back pain, unspecified: Secondary | ICD-10-CM

## 2022-04-07 NOTE — Progress Notes (Signed)
? ?Office Visit Note ?  ?Patient: Ryan [name]           ?Date of Birth: [birth year]           ?MRN: [id number] ?Visit Date: 04/07/2022 ?             ?Requested by: Ginger Organ., MD ?[address] ?[place],  [place] [address] ?PCP: Ginger Organ., MD ? ?Chief Complaint  ?Patient presents with  ? Lower Back - Pain  ? Right Hip - Pain  ? ? ? ? ?HPI: ?Patient is a pleasant 81 year old gentleman with a complicated medical history.  He is status post bilateral hip replacements with Dr. Ninfa Linden.  He has had multiple disc surgeries with Dr. Lorin Mercy.  He said that a week ago he was up on a stepladder just the first step and lost his balance and fell onto his left side.  He denies any loss of consciousness any dizziness although he says he always gets lightheaded from his medications and this happened before the accident.  He denies any weakness any loss of bowel or bladder control.  Denies any groin pain.  He fell onto his left side but does not have any bruising he does talk about pain in the mid back just slightly to the right.  This does not radiate down his leg.  He says he is actually fine during the day when he is doing things.  The pain occurs when he sits down for a while or he goes to bed.  He is scheduled to go through a procedure on his kidneys which sounds like a dilation.  He denies any pain or hematuria ? ?Assessment & Plan: ?Visit Diagnoses:  ?1. Acute bilateral low back pain without sciatica   ?2. Pain in right hip   ? ? ?Plan:  ?Patient has lower back pain.  This seems to be only happening after he has been sitting a while or when he is laying in bed.  Pain is over the lower right aspect of his back.  Cannot really reproduce the pain today.  His strength is good and he has no radicular symptoms.  He cannot take anti-inflammatories because he is on a blood thinner.  I have recommended conservative care with Tylenol.  He does have muscle relaxers and pain medication which she can take at night but  I do not want him to take those concurrently.  He understands if he starts to get any lightheadedness radicular findings weakness loss of bowel or bladder control blood in his urine he is to be seen immediately.  In the meantime he can alternate ice and heat as long as he is using a towel and has a heating pad with an automatic shut off ? ? ?   ? ?Follow-Up Instructions: No follow-ups on file.  ? ?Ortho Exam ? ?Patient is alert, oriented, no adenopathy, well-dressed, normal affect, normal respiratory effort. ?Examination of his lower back he has no ecchymosis he has 5 out of 5 strength with resisted plantarflexion dorsiflexion of his ankles resisted extension flexion of his knees.  No real tenderness to palpation today.  Sensation is intact he can wiggle his toes.  Bilateral hips good motion no reproduction of any type of thigh or groin pain ? ?Imaging: ?No results found. ?No images are attached to the encounter. ? ?Labs: ?Lab Results  ?Component Value Date  ? HGBA1C 6.0 (H) 09/11/2021  ? HGBA1C 6.1 (H) 12/28/2020  ? HGBA1C 6.4 (H)  03/20/2015  ? LABURIC 9.9 (H) 02/28/2017  ? REPTSTATUS 11/05/2021 FINAL 10/31/2021  ? REPTSTATUS 11/05/2021 FINAL 10/31/2021  ? CULT  10/31/2021  ?  NO GROWTH 5 DAYS ?Performed at [facility], [place] [address], [place], [place] [address] ?  ? CULT  10/31/2021  ?  NO GROWTH 5 DAYS ?Performed at [facility], [place] [address] [place], [place] [address] ?  ? LABORGA ESCHERICHIA COLI (A) 03/23/2017  ? ? ? ?Lab Results  ?Component Value Date  ? ALBUMIN 3.0 (L) 03/16/2022  ? ALBUMIN 4.0 11/24/2021  ? ALBUMIN 2.7 (L) 11/19/2021  ? ? ?Lab Results  ?Component Value Date  ? MG 2.1 11/19/2021  ? MG 1.8 11/01/2021  ? MG 2.2 09/11/2021  ? ?No results found for: VD25OH ? ?No results found for: PREALBUMIN ? ?  Latest Ref Rng & Units 03/18/2022  ?  2:55 AM 03/17/2022  ?  2:11 PM 03/17/2022  ?  2:07 PM  ?CBC EXTENDED  ?WBC 4.0 - 10.5 K/uL 5.7      ?RBC 4.22 - 5.81 MIL/uL 3.51      ?Hemoglobin 13.0  - 17.0 g/dL 11.1   11.9   11.6    ? 11.6    ?HCT 39.0 - 52.0 % 34.3   35.0   34.0    ? 34.0    ?Platelets 150 - 400 K/uL 194      ? ? ? ?There is no height or weight on file to calculate BMI. ? ?Orders:  ?Orders Placed This Encounter  ?Procedures  ? XR Lumbar Spine 2-3 Views  ? XR Pelvis 1-2 Views  ? ?No orders of the defined types were placed in this encounter. ? ? ? Procedures: ?No procedures performed ? ?Clinical Data: ?No additional findings. ? ?ROS: ? ?All other systems negative, except as noted in the HPI. ?Review of Systems ? ?Objective: ?Vital Signs: There were no vitals taken for this visit. ? ?Specialty Comments:  ?No specialty comments available. ? ?PMFS History: ?Patient Active Problem List  ? Diagnosis Date Noted  ? Unstable angina ([place]) 03/16/2022  ? HFrEF (heart failure with reduced ejection fraction) ([place]) 12/31/2021  ? Acute heart failure with preserved ejection fraction ([place]) 12/26/2021  ? Complete tear of right rotator cuff 11/21/2021  ? VT (ventricular tachycardia) ([place]) 11/18/2021  ? Orthostatic hypotension 11/12/2021  ? Nausea 11/12/2021  ? Aortic stenosis 11/11/2021  ? Right lower lobe pneumonia 11/01/2021  ? Prolonged QT interval 11/01/2021  ? Acquired thrombophilia ([place]) 11/01/2021  ? Aortic atherosclerosis ([place]) 11/01/2021  ? Intractable nausea and vomiting 09/10/2021  ? Hypoxemia   ? Status post implantation of automatic cardioverter/defibrillator (AICD) 03/22/2021  ? Ventricular tachycardia ([place]) 12/28/2020  ? Chronic heart failure with preserved ejection fraction ([place]) 12/28/2020  ? NSTEMI (non-ST elevated myocardial infarction) ([place])   ? Acute respiratory failure with hypoxia ([place])   ? S/P total hip arthroplasty 08/16/2019  ? Unilateral primary osteoarthritis, right hip 07/18/2019  ? Persistent atrial fibrillation ([place]) 01/27/2019  ? Vertigo 01/27/2019  ? History of pneumonia 01/27/2019  ? Stage 3a chronic kidney disease (CKD) ([place]) 01/27/2019  ? Chest pain 02/07/2018  ? Elevated  troponin 03/23/2017  ? Sigmoid diverticulitis 03/23/2017  ? Wrist pain, acute, right   ? Subclinical hypothyroidism   ? Dyspnea 02/26/2017  ? Shoulder blade pain 02/26/2017  ? SOB (shortness of breath) 02/26/2017  ? Chronic right shoulder pain   ? Hyperlipidemia LDL goal <70 01/20/2017  ? Chronic cholecystitis  07/27/2016  ? PAD (peripheral artery disease) ([place]) 07/09/2015  ? S/P CABG x 4 04/05/2015  ? Atypical chest pain 03/20/2015  ? Carotid artery disease ([place]) 10/09/2014  ? PVD (peripheral vascular disease) ([place]) 10/09/2014  ? HTN (hypertension) 05/30/2014  ? Arthritis of left hip 01/09/2014  ? Status post THR (total hip replacement) 01/09/2014  ? Spinal stenosis, lumbar 01/06/2013  ?  Class: Diagnosis of  ? Coronary artery disease 01/05/2013  ? Atherosclerosis of native artery of extremity with intermittent claudication ([place]) 10/18/2012  ? ?Past Medical History:  ?Diagnosis Date  ? Acquired thrombophilia ([place]) 11/01/2021  ? Arthritis   ? CAD (coronary artery disease)   ? Carotid artery occlusion   ? Cataract   ? 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 CHF (congestive heart failure) ([place])   ? Chronic back pain   ? COVID-19 10/31/2021  ? DVT (deep venous thrombosis) ([place])   ? Dysrhythmia   ? Enlarged prostate   ? takes Rapaflo daily  ? GERD (gastroesophageal reflux disease)   ? occasional  ? History of colon polyps   ? History of gout   ? has colchicine prn  ? History of kidney stones   ? Hyperlipidemia   ? takes Crestor daily  ? Hypertension   ? takes Amlodipine daily  ? Hypothyroidism   ? Myocardial infarction [facility])   ? Peripheral vascular disease ([place])   ? Prolonged QT interval 11/01/2021  ? Pulmonary emboli ([place]) 03/20/2015  ? elevated d-dimer, intermediate V/Q study, atypical chest pain and SOB. Start on Xarelto '20mg'$  BID for 3 month  ? Rapid atrial fibrillation ([place])   ? Renal insufficiency   ? Shortness of breath dyspnea   ? Urinary frequency   ? Urinary urgency   ?  ?Family History  ?Problem Relation Age of Onset  ?  Heart disease Father   ? Heart attack Father   ? Heart disease Sister   ? Hypertension Sister   ? Heart attack Sister   ? Hypertension Mother   ? Diabetes Son   ?  ?Past Surgical History:  ?Procedure Ladean Raya

## 2022-04-07 NOTE — Telephone Encounter (Signed)
Pt wife called and states that pt fell off a ladder and is in a lot of pain with his back. She is wondering if her husband can be worked in to see someone today since [place] and Artis Delay are in surgery.  ? ?CB [phone] ?

## 2022-04-08 ENCOUNTER — Other Ambulatory Visit: Payer: Medicare Other | Admitting: *Deleted

## 2022-04-08 ENCOUNTER — Ambulatory Visit: Payer: Medicare Other | Admitting: Cardiology

## 2022-04-08 NOTE — Patient Outreach (Signed)
?[facility]) Care Management ?Telephonic RN Care Manager Note ? ? ?04/08/2022 ?Name:  Ryan [name] MRN:  [id number] DOB:  12-Sep-1941 ? ?Summary: ?Pt recent discharged from the hospital and continue to do well with no acute needs at this time. Pt has a new CAD provider last visit on 4/3 and will undergo a stent placement last in April. Pt remains at baseline with his weights at 137.2 lbs with no symptoms in the GREEN zone. Will encouraged ongoing adherence to the discussed plan. ? ?Recommendations/Changes made from today's visit: ?Will continue to reiterate on the plan of care and encouraged daily weights and verify pt's awareness of what to do if acute symptoms should occur. No acute needs at this time as pt understanding his up coming procedure for a stent placement. ? ?Subjective: ?Ryan [name] is an 81 y.o. year old male who is a primary patient of Ginger Organ., MD. The care management team was consulted for assistance with care management and/or care coordination needs.   ? ?Telephonic RN Care Manager completed Telephone Visit today. ? ?Objective:  ? ?Medications Reviewed Today   ? ? Reviewed by Persons, Bevely Palmer, PA (Physician Assistant) on 04/07/22 at 78  Med List Status: <None>  ? ?Medication Order Taking? Sig Documenting Provider Last Dose Status Informant  ?acetaminophen (TYLENOL) 325 MG tablet [id number] No Take 2 tablets (650 mg total) by mouth every 6 (six) hours as needed for mild pain (or Fever >/= 101). Raiford Noble Pickett, DO Taking Active Spouse/Significant Other  ?allopurinol (ZYLOPRIM) 100 MG tablet [id number] No Take 100 mg by mouth daily as needed (gout). [provider] Taking Active Spouse/Significant Other  ?ALPRAZolam (XANAX) 0.5 MG tablet [id number] No Take 0.5 mg by mouth 3 (three) times daily as needed for anxiety or sleep. [provider] Taking Active Spouse/Significant Other  ?amiodarone (PACERONE) 200 MG tablet [id number] No Take 1  tablet (200 mg total) by mouth daily. Mercy Riding, MD Taking Active Spouse/Significant Other  ?apixaban (ELIQUIS) 2.5 MG TABS tablet [id number] No Take 1 tablet (2.5 mg total) by mouth 2 (two) times daily. Patwardhan, Reynold Bowen, MD Taking Active   ?bisoprolol (ZEBETA) 5 MG tablet [id number] No Take 0.5 tablets (2.5 mg total) by mouth daily. [name] T, PA-C Taking Active Spouse/Significant Other  ?finasteride (PROSCAR) 5 MG tablet [id number] No Take 5 mg by mouth daily. [provider] Taking Active Spouse/Significant Other  ?furosemide (LASIX) 20 MG tablet [id number] No Take 1 tablet (20 mg total) by mouth daily. Patwardhan, Reynold Bowen, MD Taking Active   ?isosorbide mononitrate (IMDUR) 30 MG 24 hr tablet [id number] No Take 1 tablet (30 mg total) by mouth daily. Patwardhan, Reynold Bowen, MD Taking Active Spouse/Significant Other  ?meclizine (ANTIVERT) 12.5 MG tablet [id number] No TAKE ONE TABLET BY MOUTH THREE TIMES A DAY AS NEEDED FOR DIZZINESS  ?Patient taking differently: Take 12.5 mg by mouth 3 (three) times daily as needed for dizziness.  ? Nigel Mormon, MD Taking Active Spouse/Significant Other  ?metoCLOPramide (REGLAN) 5 MG tablet [id number] No Take 1 tablet (5 mg total) by mouth every 6 (six) hours as needed for nausea. [place] Cater, PA-C Taking Active Spouse/Significant Other  ?nitroGLYCERIN (NITROSTAT) 0.4 MG SL tablet [id number] No Place 1 tablet (0.4 mg total) under the tongue every 5 (five) minutes as needed for chest pain. Evans Lance, MD Taking Active Spouse/Significant Other  ?pantoprazole (PROTONIX) 40 MG tablet [id number] No Take 1 tablet (40 mg total) by mouth  daily. [name] T, PA-C Taking Active Spouse/Significant Other  ?polyethylene glycol (MIRALAX / GLYCOLAX) 17 g packet [id number] No Take 17 g by mouth 2 (two) times daily. [provider] Taking Active Spouse/Significant Other  ?ranolazine (RANEXA) 500 MG 12 hr tablet [id number] No Take 1 tablet (500 mg total) by  mouth 2 (two) times daily. Nigel Mormon, MD Taking Active   ?REPATHA SURECLICK 245 MG/ML SOAJ [id number] No INJECT 1 PEN INTO THE SKIN EVERY 14 DAYS.  ?Patient taking differently: Inject 140 mg into the skin every 14 (fourteen) days.  ? [name], [name], MD Taking Active Spouse/Significant Other  ?tamsulosin (FLOMAX) 0.4 MG CAPS capsule [id number] No Take 0.4 mg by mouth daily. [provider] Taking Active Spouse/Significant Other  ? ?  ?  ? ?  ? ? ? ?SDOH:  (Social Determinants of Health) assessments and interventions performed:  ? ? ? ?Care Plan ? ?Review of patient past medical history, allergies, medications, health status, including review of consultants reports, laboratory and other test data, was performed as part of comprehensive evaluation for care management services.  ? ?Care Plan : RN Care Manger Plan of Care  ?Updates made by Tobi Bastos, RN since 04/08/2022 12:00 AM  ?  ? ?Problem: Knowledge Deficit related to CHF and care coordination needs   ?Priority: High  ?  ? ?Long-Range Goal: Development of plan of care formanagement of CHF. and care coordination needs.   ?Start Date: 11/26/2021  ?Expected End Date: 06/19/2022  ?This Visit's Progress: On track  ?Recent Progress: On track  ?Priority: High  ?Note:   ?Current Barriers:  ?Knowledge Deficits related to plan of care for management of CHF  ? ?RNCM Clinical Goal(s):  ?Patient will verbalize understanding of plan for management of CHF as evidenced by responding to teach-back information that was educated. ?verbalize basic understanding of  CHF disease process and self health management plan as evidenced by seld awareness of the HF zones and what to do if acute symptom occur. ?take all medications exactly as prescribed and will call provider for medication related questions as evidenced by refilling all medications  ?attend all scheduled medical appointments: 12/15 as evidenced by Discussed  through collaboration with RN Care manager,  provider, and care team.  ? ?Interventions: ?Inter-disciplinary care team collaboration (see longitudinal plan of care) ?Evaluation of current treatment plan related to  self management and patient's adherence to plan as established by provider ? ?Heart Failure Interventions:  (Status:  Goal on track:  Yes.) Long Term Goal ?Basic overview and discussion of pathophysiology of Heart Failure reviewed ?Advised patient to weigh each morning after emptying bladder ?Discussed importance of daily weight and advised patient to weigh and record daily ?Reviewed role of diuretics in prevention of fluid overload and management of heart failure; ?Discussed the importance of keeping all appointments with provider ? ?Update 1/17-Spoke wit spouse [name]) who provided update on pt's ongoing care. Reports daily weights at baseline at 140-141 lbs however ongoing history of left ankle swelling at a minimal. No other relates symptoms. Encouraged pt to elevate LE and use compression stocking that he has available. Pt will follow up with CAD provider on Friday. Note recent change in medication from Lasix to Torsemide and pt is consuming there requested 3-4 bottles of H2O. ? ?2/21- Update: Spoke with pt today who reports his  weights at baseline 138 lbs yesterday, 137 today and no weight gained over the last week. Pt denies any swelling and continues to  wear his TEDs with no incidents reported.  Continued to verified sufficient transportation to all medical appointments and adherent to all prescribed medications with no delays. Pt aware of what to do if acute symptoms should occur and remains in the GREEN zone with no symptoms since the last conversation. Will follow up next month as discussed today with pt's ongoing adherence to the plan of care generated and reviewed today. Addressed all questions and inquires with no further needs at this time. ? ?Update 04/08/2022: Pt recently hospitalized for Acute HF and will need a stent placement.  Spoke with pt today and confirm he continues to monitor his ongoing HF with no acute symptoms of issues since he recent discharged from the hospital. Pt reports he will undergo a stent placement over the nex

## 2022-04-09 DIAGNOSIS — I5032 Chronic diastolic (congestive) heart failure: Secondary | ICD-10-CM | POA: Diagnosis not present

## 2022-04-09 DIAGNOSIS — I4819 Other persistent atrial fibrillation: Secondary | ICD-10-CM | POA: Diagnosis not present

## 2022-04-09 DIAGNOSIS — I5020 Unspecified systolic (congestive) heart failure: Secondary | ICD-10-CM | POA: Diagnosis not present

## 2022-04-09 DIAGNOSIS — I25118 Atherosclerotic heart disease of native coronary artery with other forms of angina pectoris: Secondary | ICD-10-CM | POA: Diagnosis not present

## 2022-04-10 LAB — BASIC METABOLIC PANEL
BUN/Creatinine Ratio: 11 (ref 10–24)
BUN: 14 mg/dL (ref 8–27)
CO2: 23 mmol/L (ref 20–29)
Calcium: 8.6 mg/dL (ref 8.6–10.2)
Chloride: 97 mmol/L (ref 96–106)
Creatinine, Ser: 1.29 mg/dL — ABNORMAL HIGH (ref 0.76–1.27)
Glucose: 107 mg/dL — ABNORMAL HIGH (ref 70–99)
Potassium: 4.5 mmol/L (ref 3.5–5.2)
Sodium: 133 mmol/L — ABNORMAL LOW (ref 134–144)
eGFR: 56 mL/min/{1.73_m2} — ABNORMAL LOW (ref >59)

## 2022-04-10 LAB — CBC WITH DIFFERENTIAL/PLATELET
Basophils Absolute: 0 10*3/uL (ref 0.0–0.2)
Basos: 0 %
EOS (ABSOLUTE): 0 10*3/uL (ref 0.0–0.4)
Eos: 0 %
Hematocrit: 38 % (ref 37.5–51.0)
Hemoglobin: 12.5 g/dL — ABNORMAL LOW (ref 13.0–17.7)
Immature Grans (Abs): 0.1 10*3/uL (ref 0.0–0.1)
Immature Granulocytes: 1 %
Lymphocytes Absolute: 0.7 10*3/uL (ref 0.7–3.1)
Lymphs: 7 %
MCH: 31.3 pg (ref 26.6–33.0)
MCHC: 32.9 g/dL (ref 31.5–35.7)
MCV: 95 fL (ref 79–97)
Monocytes Absolute: 0.6 10*3/uL (ref 0.1–0.9)
Monocytes: 5 %
Neutrophils Absolute: 9 10*3/uL — ABNORMAL HIGH (ref 1.4–7.0)
Neutrophils: 87 %
Platelets: 326 10*3/uL (ref 150–450)
RBC: 4 x10E6/uL — ABNORMAL LOW (ref 4.14–5.80)
RDW: 12.4 % (ref 11.6–15.4)
WBC: 10.4 10*3/uL (ref 3.4–10.8)

## 2022-04-10 LAB — BRAIN NATRIURETIC PEPTIDE: BNP: 650.5 pg/mL — ABNORMAL HIGH (ref 0.0–100.0)

## 2022-04-12 ENCOUNTER — Other Ambulatory Visit: Payer: Self-pay | Admitting: Cardiology

## 2022-04-14 ENCOUNTER — Other Ambulatory Visit (HOSPITAL_COMMUNITY): Payer: Self-pay

## 2022-04-14 ENCOUNTER — Encounter (HOSPITAL_COMMUNITY): Payer: Self-pay | Admitting: Cardiology

## 2022-04-14 ENCOUNTER — Ambulatory Visit (HOSPITAL_COMMUNITY)
Admission: RE | Admit: 2022-04-14 | Discharge: 2022-04-15 | Disposition: A | Discharge status: Home / Self Care | Payer: Medicare Other | Source: Ambulatory Visit | Attending: Cardiology | Admitting: Cardiology

## 2022-04-14 ENCOUNTER — Other Ambulatory Visit: Payer: Self-pay

## 2022-04-14 ENCOUNTER — Encounter (HOSPITAL_COMMUNITY)
Admission: RE | Disposition: A | Discharge status: Home / Self Care | Payer: Self-pay | Source: Ambulatory Visit | Attending: Cardiology

## 2022-04-14 DIAGNOSIS — I6529 Occlusion and stenosis of unspecified carotid artery: Secondary | ICD-10-CM | POA: Diagnosis not present

## 2022-04-14 DIAGNOSIS — Z95810 Presence of automatic (implantable) cardiac defibrillator: Secondary | ICD-10-CM | POA: Diagnosis not present

## 2022-04-14 DIAGNOSIS — Z8673 Personal history of transient ischemic attack (TIA), and cerebral infarction without residual deficits: Secondary | ICD-10-CM | POA: Diagnosis not present

## 2022-04-14 DIAGNOSIS — I2510 Atherosclerotic heart disease of native coronary artery without angina pectoris: Secondary | ICD-10-CM | POA: Diagnosis not present

## 2022-04-14 DIAGNOSIS — Z7901 Long term (current) use of anticoagulants: Secondary | ICD-10-CM | POA: Insufficient documentation

## 2022-04-14 DIAGNOSIS — I25118 Atherosclerotic heart disease of native coronary artery with other forms of angina pectoris: Secondary | ICD-10-CM | POA: Diagnosis not present

## 2022-04-14 DIAGNOSIS — I2584 Coronary atherosclerosis due to calcified coronary lesion: Secondary | ICD-10-CM | POA: Insufficient documentation

## 2022-04-14 DIAGNOSIS — I4819 Other persistent atrial fibrillation: Secondary | ICD-10-CM | POA: Insufficient documentation

## 2022-04-14 DIAGNOSIS — I4892 Unspecified atrial flutter: Secondary | ICD-10-CM | POA: Insufficient documentation

## 2022-04-14 DIAGNOSIS — N183 Chronic kidney disease, stage 3 unspecified: Secondary | ICD-10-CM | POA: Insufficient documentation

## 2022-04-14 DIAGNOSIS — I2582 Chronic total occlusion of coronary artery: Secondary | ICD-10-CM | POA: Diagnosis not present

## 2022-04-14 DIAGNOSIS — I739 Peripheral vascular disease, unspecified: Secondary | ICD-10-CM | POA: Diagnosis not present

## 2022-04-14 DIAGNOSIS — I25708 Atherosclerosis of coronary artery bypass graft(s), unspecified, with other forms of angina pectoris: Secondary | ICD-10-CM | POA: Insufficient documentation

## 2022-04-14 DIAGNOSIS — I255 Ischemic cardiomyopathy: Secondary | ICD-10-CM | POA: Insufficient documentation

## 2022-04-14 DIAGNOSIS — Z951 Presence of aortocoronary bypass graft: Secondary | ICD-10-CM | POA: Diagnosis not present

## 2022-04-14 DIAGNOSIS — I5032 Chronic diastolic (congestive) heart failure: Secondary | ICD-10-CM | POA: Diagnosis not present

## 2022-04-14 DIAGNOSIS — Z79899 Other long term (current) drug therapy: Secondary | ICD-10-CM | POA: Diagnosis not present

## 2022-04-14 HISTORY — PX: CORONARY BALLOON ANGIOPLASTY: CATH118233

## 2022-04-14 HISTORY — PX: CORONARY ULTRASOUND/IVUS: CATH118244

## 2022-04-14 HISTORY — PX: LEFT HEART CATH: CATH118248

## 2022-04-14 HISTORY — PX: INTRAVASCULAR ULTRASOUND/IVUS: CATH118244

## 2022-04-14 LAB — POCT ACTIVATED CLOTTING TIME
Activated Clotting Time: 245 seconds
Activated Clotting Time: 354 seconds
Activated Clotting Time: 576 seconds

## 2022-04-14 SURGERY — LEFT HEART CATH
Anesthesia: LOCAL

## 2022-04-14 MED ORDER — LABETALOL HCL 5 MG/ML IV SOLN: 10.0000 mg | INTRAVENOUS | Status: AC | PRN

## 2022-04-14 MED ORDER — LIDOCAINE HCL (PF) 1 % IJ SOLN
INTRAMUSCULAR | Status: AC
  Filled 2022-04-14: qty 30

## 2022-04-14 MED ORDER — CLOPIDOGREL BISULFATE 75 MG PO TABS
75.0000 mg | ORAL_TABLET | Freq: Every day | ORAL | Status: DC
  Filled 2022-04-14: qty 1

## 2022-04-14 MED ORDER — POLYETHYLENE GLYCOL 3350 17 G PO PACK: 17.0000 g | PACK | Freq: Every day | ORAL | Status: DC

## 2022-04-14 MED ORDER — RANOLAZINE ER 500 MG PO TB12
500.0000 mg | ORAL_TABLET | Freq: Two times a day (BID) | ORAL | Status: DC
  Administered 2022-04-14: 500 mg via ORAL
  Filled 2022-04-14 (×2): qty 1

## 2022-04-14 MED ORDER — NITROGLYCERIN 1 MG/10 ML FOR IR/CATH LAB
INTRA_ARTERIAL | Status: AC
  Filled 2022-04-14: qty 10

## 2022-04-14 MED ORDER — ALPRAZOLAM 0.5 MG PO TABS: 0.5000 mg | ORAL_TABLET | Freq: Three times a day (TID) | ORAL | Status: DC | PRN

## 2022-04-14 MED ORDER — HYDRALAZINE HCL 20 MG/ML IJ SOLN: 10.0000 mg | INTRAMUSCULAR | Status: AC | PRN

## 2022-04-14 MED ORDER — MIDAZOLAM HCL 2 MG/2ML IJ SOLN
INTRAMUSCULAR | Status: AC
  Filled 2022-04-14: qty 2

## 2022-04-14 MED ORDER — MECLIZINE HCL 12.5 MG PO TABS
12.5000 mg | ORAL_TABLET | Freq: Every morning | ORAL | Status: DC
  Filled 2022-04-14: qty 1

## 2022-04-14 MED ORDER — APIXABAN 2.5 MG PO TABS
2.5000 mg | ORAL_TABLET | Freq: Two times a day (BID) | ORAL | Status: DC
  Filled 2022-04-14: qty 1

## 2022-04-14 MED ORDER — SODIUM CHLORIDE 0.9% FLUSH: 3.0000 mL | Freq: Two times a day (BID) | INTRAVENOUS | Status: DC

## 2022-04-14 MED ORDER — PANTOPRAZOLE SODIUM 40 MG PO TBEC
40.0000 mg | DELAYED_RELEASE_TABLET | Freq: Every day | ORAL | Status: DC
  Administered 2022-04-14: 40 mg via ORAL
  Filled 2022-04-14 (×2): qty 1

## 2022-04-14 MED ORDER — HEPARIN (PORCINE) IN NACL 1000-0.9 UT/500ML-% IV SOLN
INTRAVENOUS | Status: DC | PRN
  Administered 2022-04-14 (×2): 500 mL

## 2022-04-14 MED ORDER — VERAPAMIL HCL 2.5 MG/ML IV SOLN
INTRAVENOUS | Status: AC
  Filled 2022-04-14: qty 2

## 2022-04-14 MED ORDER — ALLOPURINOL 100 MG PO TABS: 100.0000 mg | ORAL_TABLET | Freq: Every day | ORAL | Status: DC | PRN

## 2022-04-14 MED ORDER — FUROSEMIDE 20 MG PO TABS
20.0000 mg | ORAL_TABLET | Freq: Every day | ORAL | Status: DC
  Filled 2022-04-14 (×2): qty 1

## 2022-04-14 MED ORDER — SODIUM CHLORIDE 0.9 % IV SOLN: INTRAVENOUS | Status: DC

## 2022-04-14 MED ORDER — FENTANYL CITRATE (PF) 100 MCG/2ML IJ SOLN
INTRAMUSCULAR | Status: AC
  Filled 2022-04-14: qty 2

## 2022-04-14 MED ORDER — NITROGLYCERIN 1 MG/10 ML FOR IR/CATH LAB
INTRA_ARTERIAL | Status: DC | PRN
  Administered 2022-04-14: 200 ug via INTRACORONARY

## 2022-04-14 MED ORDER — SODIUM CHLORIDE 0.9 % IV SOLN: 250.0000 mL | INTRAVENOUS | Status: DC | PRN

## 2022-04-14 MED ORDER — SODIUM CHLORIDE 0.9% FLUSH: 3.0000 mL | INTRAVENOUS | Status: DC | PRN

## 2022-04-14 MED ORDER — IOHEXOL 350 MG/ML SOLN
INTRAVENOUS | Status: DC | PRN
  Administered 2022-04-14: 110 mL via INTRA_ARTERIAL

## 2022-04-14 MED ORDER — BISOPROLOL FUMARATE 5 MG PO TABS
2.5000 mg | ORAL_TABLET | Freq: Every day | ORAL | Status: DC
  Administered 2022-04-14: 2.5 mg via ORAL
  Filled 2022-04-14 (×2): qty 1

## 2022-04-14 MED ORDER — SODIUM CHLORIDE 0.9 % IV SOLN
INTRAVENOUS | Status: AC | PRN
  Administered 2022-04-14: 250 mL via INTRAVENOUS

## 2022-04-14 MED ORDER — SODIUM CHLORIDE 0.9 % IV SOLN: INTRAVENOUS | Status: AC

## 2022-04-14 MED ORDER — ISOSORBIDE MONONITRATE ER 30 MG PO TB24
30.0000 mg | ORAL_TABLET | Freq: Every day | ORAL | Status: DC
  Filled 2022-04-14: qty 1

## 2022-04-14 MED ORDER — HEPARIN SODIUM (PORCINE) 1000 UNIT/ML IJ SOLN
INTRAMUSCULAR | Status: AC
  Filled 2022-04-14: qty 10

## 2022-04-14 MED ORDER — MIDAZOLAM HCL 2 MG/2ML IJ SOLN
INTRAMUSCULAR | Status: DC | PRN
  Administered 2022-04-14: 1 mg via INTRAVENOUS

## 2022-04-14 MED ORDER — SODIUM CHLORIDE 0.9% FLUSH
3.0000 mL | Freq: Two times a day (BID) | INTRAVENOUS | Status: DC
  Administered 2022-04-14: 3 mL via INTRAVENOUS

## 2022-04-14 MED ORDER — AMIODARONE HCL 200 MG PO TABS
200.0000 mg | ORAL_TABLET | Freq: Every day | ORAL | Status: DC
  Administered 2022-04-14: 200 mg via ORAL
  Filled 2022-04-14 (×2): qty 1

## 2022-04-14 MED ORDER — CLOPIDOGREL BISULFATE 75 MG PO TABS
600.0000 mg | ORAL_TABLET | Freq: Once | ORAL | Status: AC
  Administered 2022-04-14: 600 mg via ORAL
  Filled 2022-04-14: qty 8

## 2022-04-14 MED ORDER — ENSURE ENLIVE PO LIQD
237.0000 mL | Freq: Two times a day (BID) | ORAL | Status: DC
  Administered 2022-04-14: 237 mL via ORAL

## 2022-04-14 MED ORDER — ACETAMINOPHEN 325 MG PO TABS: 650.0000 mg | ORAL_TABLET | Freq: Four times a day (QID) | ORAL | Status: DC | PRN

## 2022-04-14 MED ORDER — VERAPAMIL HCL 2.5 MG/ML IV SOLN
INTRAVENOUS | Status: DC | PRN
  Administered 2022-04-14: 10 mL via INTRA_ARTERIAL

## 2022-04-14 MED ORDER — FENTANYL CITRATE (PF) 100 MCG/2ML IJ SOLN
INTRAMUSCULAR | Status: DC | PRN
  Administered 2022-04-14: 25 ug via INTRAVENOUS

## 2022-04-14 MED ORDER — ACETAMINOPHEN 325 MG PO TABS: 650.0000 mg | ORAL_TABLET | ORAL | Status: DC | PRN

## 2022-04-14 MED ORDER — FINASTERIDE 5 MG PO TABS
5.0000 mg | ORAL_TABLET | Freq: Every morning | ORAL | Status: DC
  Filled 2022-04-14: qty 1

## 2022-04-14 MED ORDER — CLOPIDOGREL BISULFATE 75 MG PO TABS
75.0000 mg | ORAL_TABLET | Freq: Every day | ORAL | 1 refills | Status: DC
  Filled 2022-04-14: qty 30, 30d supply, fill #0

## 2022-04-14 MED ORDER — HEPARIN SODIUM (PORCINE) 1000 UNIT/ML IJ SOLN
INTRAMUSCULAR | Status: DC | PRN
  Administered 2022-04-14: 6000 [IU] via INTRAVENOUS
  Administered 2022-04-14: 3000 [IU] via INTRAVENOUS

## 2022-04-14 MED ORDER — TAMSULOSIN HCL 0.4 MG PO CAPS
0.4000 mg | ORAL_CAPSULE | Freq: Every morning | ORAL | Status: DC
  Filled 2022-04-14: qty 1

## 2022-04-14 MED ORDER — ASPIRIN 81 MG PO CHEW
81.0000 mg | CHEWABLE_TABLET | ORAL | Status: AC
  Administered 2022-04-14: 81 mg via ORAL
  Filled 2022-04-14: qty 1

## 2022-04-14 MED ORDER — HEPARIN (PORCINE) IN NACL 1000-0.9 UT/500ML-% IV SOLN
INTRAVENOUS | Status: AC
  Filled 2022-04-14: qty 1000

## 2022-04-14 MED ORDER — LIDOCAINE HCL (PF) 1 % IJ SOLN
INTRAMUSCULAR | Status: DC | PRN
  Administered 2022-04-14: 2 mL

## 2022-04-14 MED ORDER — METHOCARBAMOL 500 MG PO TABS: 500.0000 mg | ORAL_TABLET | Freq: Every day | ORAL | Status: DC | PRN

## 2022-04-14 SURGICAL SUPPLY — 33 items
BALLN SAPPHIRE 1.5X10 (BALLOONS) ×2
BALLN SAPPHIRE 2.0X12 (BALLOONS) ×4
BALLN SAPPHIRE 2.5X12 (BALLOONS) ×2
BALLN SCOREFLEX 2.50X10 (BALLOONS) ×2
BALLN SCOREFLEX 2.50X15 (BALLOONS) ×2
BALLN WOLVERINE 2.00X15 (BALLOONS) ×2
BALLOON SAPPHIRE 1.5X10 (BALLOONS) IMPLANT
BALLOON SAPPHIRE 2.0X12 (BALLOONS) IMPLANT
BALLOON SAPPHIRE 2.5X12 (BALLOONS) IMPLANT
BALLOON SCOREFLEX 2.50X10 (BALLOONS) IMPLANT
BALLOON SCOREFLEX 2.50X15 (BALLOONS) IMPLANT
BALLOON WOLVERINE 2.00X15 (BALLOONS) IMPLANT
CATH INFINITI JR4 5F (CATHETERS) ×1 IMPLANT
CATH LAUNCHER 6FR EBU 3 (CATHETERS) ×1 IMPLANT
CATH LAUNCHER 6FR EBU3.5 (CATHETERS) ×1 IMPLANT
CATH OPTICROSS HD (CATHETERS) ×1 IMPLANT
DEVICE RAD COMP TR BAND LRG (VASCULAR PRODUCTS) ×1 IMPLANT
ELECT DEFIB PAD ADLT CADENCE (PAD) ×1 IMPLANT
GLIDESHEATH SLEND SS 6F .021 (SHEATH) ×1 IMPLANT
GUIDEWIRE INQWIRE 1.5J.035X260 (WIRE) IMPLANT
INQWIRE 1.5J .035X260CM (WIRE) ×2
KIT ENCORE 26 ADVANTAGE (KITS) ×1 IMPLANT
KIT HEART LEFT (KITS) ×3 IMPLANT
KIT HEMO VALVE WATCHDOG (MISCELLANEOUS) ×1 IMPLANT
PACK CARDIAC CATHETERIZATION (CUSTOM PROCEDURE TRAY) ×3 IMPLANT
SHEATH 6FR 75 DEST SLENDER (SHEATH) ×1 IMPLANT
SHEATH PROBE COVER 6X72 (BAG) ×1 IMPLANT
SLED PULL BACK IVUS (MISCELLANEOUS) ×1 IMPLANT
TRANSDUCER W/STOPCOCK (MISCELLANEOUS) ×2 IMPLANT
TUBING CIL FLEX 10 FLL-RA (TUBING) ×2 IMPLANT
WIRE ASAHI PROWATER 180CM (WIRE) ×2 IMPLANT
WIRE HI TORQ BMW 190CM (WIRE) IMPLANT
WIRE HI TORQ VERSACORE-J 145CM (WIRE) ×1 IMPLANT

## 2022-04-14 NOTE — Plan of Care (Signed)

## 2022-04-14 NOTE — H&P (Signed)
OV copied for documentation ? ? ? ? ?Patient referred by Ginger Organ., MD for chest pain, shortness of breath ? ?Subjective:  ? ?Ryan [name], male    DOB: 02-15-1941, 81 y.o.   MRN: [id number] ? ? ?Chief Complaint  ?Patient presents with  ? Hospitalization Follow-up  ? Congestive Heart Failure  ?  2 Week  ? ? ? ?HPI ? ?81 year old caucasian male with coronary artery disease status s/p CABG (patent LIMA-LAD, occluded SVG-ramus/OM Y graft), ischemic cardiomyopathy with recovered EF, s/p ICD, mod AS, persistent Afib, PAD s/o aortobifemoral bypass, mod carotid disease, h/o stroke, CKD III ? ?Patient was hospitalized in 02/2022 with chest pain, shortness of breath, leg edema. He was diuresed well with improvement in dyspnea., resolution of leg edema. Angiogram showed severe native vessel  disease, patent LIMA-LAD. I started him on Ranexa. ? ?He still has episodes of chest pain from time to time. He asks me if we could perform coronary intervention. ? ?On a separate note, he has had difficulty urinating. His urologist Dr. Abner Greenspan has offered to do a procedure in 2-3 weeks, likely under general anesthesia (according to the patient).   ? ?Current Outpatient Medications:  ?  acetaminophen (TYLENOL) 325 MG tablet, Take 2 tablets (650 mg total) by mouth every 6 (six) hours as needed for mild pain (or Fever >/= 101)., Disp: 20 tablet, Rfl: 0 ?  allopurinol (ZYLOPRIM) 100 MG tablet, Take 100 mg by mouth daily as needed (gout)., Disp: , Rfl:  ?  ALPRAZolam (XANAX) 0.5 MG tablet, Take 0.5 mg by mouth 3 (three) times daily as needed for anxiety or sleep., Disp: , Rfl:  ?  amiodarone (PACERONE) 200 MG tablet, Take 1 tablet (200 mg total) by mouth daily., Disp: 90 tablet, Rfl: 1 ?  apixaban (ELIQUIS) 2.5 MG TABS tablet, Take 1 tablet (2.5 mg total) by mouth 2 (two) times daily., Disp: 60 tablet, Rfl: 5 ?  bisoprolol (ZEBETA) 5 MG tablet, Take 0.5 tablets (2.5 mg total) by mouth daily., Disp: 45 tablet, Rfl: 3 ?  finasteride  (PROSCAR) 5 MG tablet, Take 5 mg by mouth daily., Disp: , Rfl:  ?  furosemide (LASIX) 20 MG tablet, Take 1 tablet (20 mg total) by mouth daily., Disp: 30 tablet, Rfl: 1 ?  isosorbide mononitrate (IMDUR) 30 MG 24 hr tablet, Take 1 tablet (30 mg total) by mouth daily., Disp: 30 tablet, Rfl: 2 ?  meclizine (ANTIVERT) 12.5 MG tablet, TAKE ONE TABLET BY MOUTH THREE TIMES A DAY AS NEEDED FOR DIZZINESS (Patient taking differently: Take 12.5 mg by mouth 3 (three) times daily as needed for dizziness.), Disp: 60 tablet, Rfl: 1 ?  metoCLOPramide (REGLAN) 5 MG tablet, Take 1 tablet (5 mg total) by mouth every 6 (six) hours as needed for nausea., Disp: 15 tablet, Rfl: 0 ?  nitroGLYCERIN (NITROSTAT) 0.4 MG SL tablet, Place 1 tablet (0.4 mg total) under the tongue every 5 (five) minutes as needed for chest pain., Disp: 25 tablet, Rfl: 3 ?  pantoprazole (PROTONIX) 40 MG tablet, Take 1 tablet (40 mg total) by mouth daily., Disp: 30 tablet, Rfl: 11 ?  polyethylene glycol (MIRALAX / GLYCOLAX) 17 g packet, Take 17 g by mouth 2 (two) times daily., Disp: , Rfl:  ?  ranolazine (RANEXA) 500 MG 12 hr tablet, Take 1 tablet (500 mg total) by mouth 2 (two) times daily., Disp: 60 tablet, Rfl: 2 ?  REPATHA SURECLICK 601 MG/ML SOAJ, INJECT 1 PEN INTO THE SKIN  EVERY 14 DAYS. (Patient taking differently: Inject 140 mg into the skin every 14 (fourteen) days.), Disp: 2 mL, Rfl: 11 ?  tamsulosin (FLOMAX) 0.4 MG CAPS capsule, Take 0.4 mg by mouth daily., Disp: , Rfl:  ? ? ? ?Cardiovascular and other pertinent studies: ? ?EKG 03/16/2022: ?Atrial fibrillation 55 bpm  ?Diffuse nonspecific T-abnormality  ?Low voltage ?Old anteroseptal infarct ? ? ?Echocardiogram 12/27/2021: ? 1. Left ventricular ejection fraction, by estimation, is 50 to 55%. The  ?left ventricle has low normal function. The left ventricle demonstrates  ?regional wall motion abnormalities (see scoring diagram/findings for  ?description). Diastolic function not  ?assessed due to Afib. There is  severe hypokinesis of the left ventricular,  ?basal anteroseptal wall.  ? 2. Right ventricular systolic function is mildly reduced. The right  ?ventricular size is normal. There is mildly elevated pulmonary artery  ?systolic pressure.  ? 3. Left atrial size was severely dilated.  ? 4. Right atrial size was moderately dilated.  ? 5. The mitral valve is grossly normal. Mild mitral valve regurgitation.  ? 6. Mild tricuspid regurgitation. peak RA-RV gradient 33 mmHg. . Tricuspid  ?valve regurgitation is mild.  ? 7. Vmax 2.3 m/sec, mean PG 13 mmHg. AVA by continuity equation is 0.9  ?cm2, with dimensionless index of 0.24. Small LV cavity with LV SV index of  ?20 cc/m2. Likely paradoxically low flow low gradient severe aortic  ?stenosis. . The aortic valve is  ?tricuspid. There is moderate calcification of the aortic valve.  ? 8. Overall, LVEF assessment remains difficult. No significant change  ?compared to previous study in 08/2021.  ? ?Coronary and bypass graft angiography 01/2019: ?Severe native coronary artery disease with diffuse proximal to mid LAD stenosis with diffusely diseased native LAD proximally and mid with competitive filling from the LIMA graft; 90% proximal stenosis in the ramus immediate vessel, diffuse stenosis of 60 to 70% in the proximal circumflex with diffuse 80% distal stenosis and total occlusion of the AV groove after marginal vessel with total occlusion of the proximal native RCA.  There is extensive collateralization to the distal RCA both via the circumflex vessel as well as via the LIMA to LAD and distal LAD. ?  ?Patent LIMA graft supplying the mid LAD ?  ?Previously documented old occlusion of the previous Y graft which had supplied the ramus and marginal vessel in the vein graft which had supplied the PDA. ?  ?Low right heart pressures with mean PA pressure at 19 mmHg. ?  ?RECOMMENDATION: ?Increase medical therapy.  The patient has only been on low-dose anti-ischemic medical regimen  consisting of bisoprolol 2.5 mg as well as isosorbide 60 mg.  Recommend initiation of amlodipine 5 mg.  Also recommend consideration of Ranexa 500 mg twice a day.  The patient is on rosuvastatin.  However if the patient is to be on atorvastatin, the maximum recommended dose of atorvastatin is 40 mg if the patient is on ranolazine. ? ? ?Echocardiogram 08/28/2021: ?1. Left ventricular ejection fraction, by estimation, is 50 to 55%. The left ventricle has low normal function. The left ventricle demonstrates regional wall motion abnormalities. Basal anterior/anteroseptal hypokinesis. There is severe asymmetric left ?ventricular hypertrophy of the basal-septal segment. Left ventricular diastolic parameters are indeterminate. ?2. Right ventricular systolic function is moderately reduced. The right ventricular size is normal. There is normal pulmonary artery systolic pressure. ?3. The mitral valve is normal in structure. Trivial mitral valve regurgitation. No evidence of mitral stenosis. ?4. The inferior vena cava is normal  in size with greater than 50% respiratory variability, suggesting right atrial pressure of 3 mmHg. ?5. The aortic valve is calcified. There is moderate calcification of the aortic valve. Aortic valve regurgitation is not visualized. Moderate aortic valve stenosis. Mean gradient only 95mHg, but AVA 1.0 cm^2 and DI 0.35, suspect paradoxical low flow low gradient moderate AS given low SV index (21cc/m^2) ?6. Small mobile echodensity adjacent to RV pacemaker lead (seen on image 14), could represent fibrinous strand or small adherent thrombus; if clinical concern for infection would check blood cultures ? ? ?Recent labs: ?01/07/2022: ?Glucose 102, BUN/Cr 23/1.41. EGFR 50. Na/K 137/4.9.  ?BNP 599 ? ?12/31/2021: ?BNP 592 ? ?12/28/2021: ?Glucose 91, BUN/Cr 21/1.54. EGFR 45. Na/K 138/3.7.  ?H/H 13/40. MCV 97. Platelets 258 ? ? Latest Reference Range & Units 12/23/21 14:57 12/25/21 15:11 12/25/21 20:21 12/26/21 12:03  12/26/21 17:54  ?B Natriuretic Peptide 0.0 - 100.0 pg/mL 1,115.7 (H) 864.7 (H)     ?Troponin I (High Sensitivity) <18 ng/L 42 (H) 41 (H) 41 (H) 41 (H) 62 (H)  ?(H): Data is abnormally high ? ?12/23/2021: ?Glucose

## 2022-04-14 NOTE — CV Procedure (Signed)
Partially successful PTCA to Lcx ?Anatomy not conducive to stenting. Non-flow limiting focal dissection. ?Monitor overnight. ? ? ?Nigel Mormon, MD ?Pager: [phone] ?Office: [phone] ? ?

## 2022-04-14 NOTE — Interval H&P Note (Signed)
History and Physical Interval Note: ? ?04/14/2022 ?12:19 PM ? ?Ryan [name]  has presented today for surgery, with the diagnosis of heart failure.  The various methods of treatment have been discussed with the patient and family. After consideration of risks, benefits and other options for treatment, the patient has consented to  Procedure(s): ?CORONARY STENT INTERVENTION (N/A) as a surgical intervention.  The patient's history has been reviewed, patient examined, no change in status, stable for surgery.  I have reviewed the patient's chart and labs.  Questions were answered to the patient's satisfaction.   ? ?2016/2017 Appropriate Use Criteria for Coronary Revascularization ?Symptom Status: Ischemic Symptoms  ?Non-invasive Testing: High risk  ?If no or indeterminate stress test, FFR/iFR results in all diseased vessels: N/A  ?Diabetes Mellitus: No  ?S/P CABG: Yes  ?Antianginal therapy (number of long-acting drugs): >=2  ?Patient undergoing renal transplant: No  ?Patient undergoing percutaneous valve procedure: No  ?LIMA-LAD patent and without significant stenoses PCI CABG  ?Stenosis supplying 1 territory (bypass graft or native artery) other than anterior A (8); Indication 4 M (5); Indication 31  ?Stenoses supplying 2 territories (bypass graft or native artery, either 2 separate vessels or sequential graft supplying 2 territories) not including anterior territory A (8); Indication 30 M (6); Indication 34  ?LIMA-LAD not patent  ?Stenosis supplying 1 territory (bypass graft or native artery)-anterior (LAD) territory A (8); Indication 22 M (6); Indication 36  ?Stenoses supplying 2 territories (bypass graft or native artery, either 2 separate vessels or sequential graft supplying 2 territories)-LAD plus other territory A (8); Indication 39 A (8); Indication 39  ?Stenoses supplying 3 territories (bypass graft or native arteries, separate vessels, sequential grafts, or combination thereof)-LAD plus 2 other  territories A (8); Indication 41 A (8); Indication 41  ? ? ? ? ? ?Agar ? ? ?

## 2022-04-15 ENCOUNTER — Encounter (HOSPITAL_COMMUNITY): Payer: Self-pay | Admitting: Cardiology

## 2022-04-15 ENCOUNTER — Other Ambulatory Visit (HOSPITAL_COMMUNITY): Payer: Self-pay

## 2022-04-15 DIAGNOSIS — I255 Ischemic cardiomyopathy: Secondary | ICD-10-CM | POA: Diagnosis not present

## 2022-04-15 DIAGNOSIS — I2510 Atherosclerotic heart disease of native coronary artery without angina pectoris: Secondary | ICD-10-CM | POA: Diagnosis not present

## 2022-04-15 DIAGNOSIS — I4892 Unspecified atrial flutter: Secondary | ICD-10-CM | POA: Diagnosis not present

## 2022-04-15 DIAGNOSIS — I25708 Atherosclerosis of coronary artery bypass graft(s), unspecified, with other forms of angina pectoris: Secondary | ICD-10-CM | POA: Diagnosis not present

## 2022-04-15 DIAGNOSIS — I6529 Occlusion and stenosis of unspecified carotid artery: Secondary | ICD-10-CM | POA: Diagnosis not present

## 2022-04-15 DIAGNOSIS — I25118 Atherosclerotic heart disease of native coronary artery with other forms of angina pectoris: Secondary | ICD-10-CM | POA: Diagnosis not present

## 2022-04-15 DIAGNOSIS — Z8673 Personal history of transient ischemic attack (TIA), and cerebral infarction without residual deficits: Secondary | ICD-10-CM | POA: Diagnosis not present

## 2022-04-15 DIAGNOSIS — Z7901 Long term (current) use of anticoagulants: Secondary | ICD-10-CM | POA: Diagnosis not present

## 2022-04-15 DIAGNOSIS — N183 Chronic kidney disease, stage 3 unspecified: Secondary | ICD-10-CM | POA: Diagnosis not present

## 2022-04-15 DIAGNOSIS — I5032 Chronic diastolic (congestive) heart failure: Secondary | ICD-10-CM | POA: Diagnosis not present

## 2022-04-15 DIAGNOSIS — Z951 Presence of aortocoronary bypass graft: Secondary | ICD-10-CM | POA: Diagnosis not present

## 2022-04-15 DIAGNOSIS — Z95810 Presence of automatic (implantable) cardiac defibrillator: Secondary | ICD-10-CM | POA: Diagnosis not present

## 2022-04-15 DIAGNOSIS — Z79899 Other long term (current) drug therapy: Secondary | ICD-10-CM | POA: Diagnosis not present

## 2022-04-15 DIAGNOSIS — I4819 Other persistent atrial fibrillation: Secondary | ICD-10-CM | POA: Diagnosis not present

## 2022-04-15 DIAGNOSIS — I739 Peripheral vascular disease, unspecified: Secondary | ICD-10-CM | POA: Diagnosis not present

## 2022-04-15 LAB — BASIC METABOLIC PANEL
Anion gap: 8 (ref 5–15)
BUN: 15 mg/dL (ref 8–23)
CO2: 24 mmol/L (ref 22–32)
Calcium: 8.4 mg/dL — ABNORMAL LOW (ref 8.9–10.3)
Chloride: 102 mmol/L (ref 98–111)
Creatinine, Ser: 1.22 mg/dL (ref 0.61–1.24)
GFR, Estimated: 60 mL/min — ABNORMAL LOW (ref >60)
Glucose, Bld: 97 mg/dL (ref 70–99)
Potassium: 4.1 mmol/L (ref 3.5–5.1)
Sodium: 134 mmol/L — ABNORMAL LOW (ref 135–145)

## 2022-04-15 LAB — CBC
HCT: 35.9 % — ABNORMAL LOW (ref 39.0–52.0)
Hemoglobin: 11.9 g/dL — ABNORMAL LOW (ref 13.0–17.0)
MCH: 32.2 pg (ref 26.0–34.0)
MCHC: 33.1 g/dL (ref 30.0–36.0)
MCV: 97 fL (ref 80.0–100.0)
Platelets: 331 10*3/uL (ref 150–400)
RBC: 3.7 MIL/uL — ABNORMAL LOW (ref 4.22–5.81)
RDW: 14.2 % (ref 11.5–15.5)
WBC: 7.9 10*3/uL (ref 4.0–10.5)
nRBC: 0 % (ref 0.0–0.2)

## 2022-04-15 MED ORDER — BISOPROLOL FUMARATE 5 MG PO TABS: 2.5000 mg | ORAL_TABLET | Freq: Every day | ORAL | 0 refills | Status: DC

## 2022-04-15 MED ORDER — BISOPROLOL FUMARATE 5 MG PO TABS
5.0000 mg | ORAL_TABLET | Freq: Every day | ORAL | 3 refills | Status: DC
  Filled 2022-04-15: qty 30, 30d supply, fill #0

## 2022-04-15 NOTE — Discharge Summary (Signed)
Physician Discharge Summary  ?Patient ID: ?Ryan [name] ?MRN: [id number] ?DOB/AGE: May 12, 1941 81 y.o. ? ?Admit date: 04/14/2022 ?Discharge date: 04/15/2022 ? ?Primary Discharge Diagnosis: ?Coronary artery disease ? ?Secondary Discharge Diagnosis: ?Ischemic cardiomyopathy ? ? ?Hospital Course:  ? ?81 year old caucasian male with coronary artery disease status s/p CABG (patent LIMA-LAD, occluded SVG-ramus/OM Y graft), ischemic cardiomyopathy with recovered EF, s/p ICD, mod AS, persistent Afib, PAD s/o aortobifemoral bypass, mod carotid disease, h/o stroke, CKD III ? ?Patient has had recurrent hospitalizations with chest pain, acute on chronic HFpEF, worsening angina and dyspnea on exertion. He is on excellent medical management at baseline, Given persistent symptoms, patient preferred to attempt coronary intervention. ?  ?Partially successful PCI attempt, limited by difficult do dilate lesion, mild focal, non-flow limiting linear dissection. Recommend medical management for now. Recommend plavix and eliquis for 3 months then okay to hold plavix for any G?I/GU procedure.  ? ?Overnight, patient had asymptomatic episodes of wide complex rhythm, most likely idioventricular rhythm. Continue amidoarone, bisoprolol. Patient ambulated without any chest pain or significant dyspnea. ? ? ?Discharge Exam: ?Blood pressure 127/62, pulse 66, temperature 97.9 ?F (36.6 ?C), temperature source Oral, resp. rate 18, height '5\' 6"'  (1.676 m), weight 63 kg, SpO2 99 %.  ?  ?Physical Exam ?Vitals and nursing note reviewed.  ?Constitutional:   ?   General: He is not in acute distress. ?Neck:  ?   Vascular: No JVD.  ?Cardiovascular:  ?   Rate and Rhythm: Normal rate. Rhythm irregular.  ?   Heart sounds: Normal heart sounds. No murmur heard. ?Pulmonary:  ?   Effort: Pulmonary effort is normal.  ?   Breath sounds: Normal breath sounds. No wheezing or rales.  ? ? ? ? ?Significant Diagnostic Studies: ? ?EKG 04/15/2022: ?Afib ?No significant ST-T  changes ? ?Coronary intervention 04/15/2022: ?Attempted Lcx intervention ?Partially successful PTCA, 2.5 mm Scoreflex balloon at 10 atm ?Anatomy not conducive to stenting. ?Continue medical management. ?  ? ? ?FOLLOW UP PLANS AND APPOINTMENTS ?Discharge Instructions   ? ? AMB Referral to Cardiac Rehabilitation - Phase II   Complete by: As directed ?  ? Diagnosis: PTCA  ? After initial evaluation and assessments completed: Virtual Based Care may be provided alone or in conjunction with Phase 2 Cardiac Rehab based on patient barriers.: Yes  ? Diet - low sodium heart healthy   Complete by: As directed ?  ? Diet - low sodium heart healthy   Complete by: As directed ?  ? Increase activity slowly   Complete by: As directed ?  ? Increase activity slowly   Complete by: As directed ?  ? No wound care   Complete by: As directed ?  ? No wound care   Complete by: As directed ?  ? ?  ? ?Allergies as of 04/15/2022   ? ?   Reactions  ? Jardiance [empagliflozin] Nausea Only, Other (See Comments)  ? Made patient very sick to the stomach.  ? Ezetimibe Other (See Comments)  ? Myalgias   ? Ace Inhibitors Cough  ? Other reaction(s): cough  ? Aspirin Other (See Comments)  ? skin rash/easy bruising  ? Atorvastatin Other (See Comments)  ? weakness, fatigue (severe)  ? Ezetimibe-simvastatin Other (See Comments)  ? myalgias  ? Rosuvastatin Other (See Comments)  ? Myalgias/weakness  ? Codeine Nausea And Vomiting  ?    ? Unithroid [levothyroxine Sodium] Other (See Comments)  ? Caused blurry vision per pt  ? ?  ? ?  ?  Medication List  ?  ? ?TAKE these medications   ? ?acetaminophen 325 MG tablet ?Commonly known as: TYLENOL ?Take 2 tablets (650 mg total) by mouth every 6 (six) hours as needed for mild pain (or Fever >/= 101). ?  ?allopurinol 100 MG tablet ?Commonly known as: ZYLOPRIM ?Take 100 mg by mouth daily as needed (gout). ?  ?ALPRAZolam 0.5 MG tablet ?Commonly known as: Duanne Moron ?Take 0.5 mg by mouth 3 (three) times daily as needed for anxiety  or sleep. ?  ?amiodarone 200 MG tablet ?Commonly known as: PACERONE ?Take 1 tablet (200 mg total) by mouth daily. ?Notes to patient: Heart rhythm ?  ?apixaban 5 MG Tabs tablet ?Commonly known as: ELIQUIS ?Take 5 mg by mouth 2 (two) times daily. ?What changed: Another medication with the same name was removed. Continue taking this medication, and follow the directions you see here. ?Notes to patient: Blood thinner  ?Prevents clotting  ?  ?bisoprolol 5 MG tablet ?Commonly known as: ZEBETA ?Take 0.5 tablets (2.5 mg total) by mouth daily. ?  ?clopidogrel 75 MG tablet ?Commonly known as: PLAVIX ?Take 1 tablet (75 mg total) by mouth daily. ?Notes to patient: Blood thinner  ?Prevents clotting in heart arteries and heart attack ?  ?finasteride 5 MG tablet ?Commonly known as: PROSCAR ?Take 5 mg by mouth in the morning. ?Notes to patient: Prostate  ?  ?furosemide 20 MG tablet ?Commonly known as: Lasix ?Take 1 tablet (20 mg total) by mouth daily. ?Notes to patient: Fluid medication  ?  ?isosorbide mononitrate 30 MG 24 hr tablet ?Commonly known as: IMDUR ?Take 1 tablet (30 mg total) by mouth daily. ?Notes to patient: Decreases blood pressure  ?Increases blood to heart muscle ?Decreases chest pain  ?  ?meclizine 12.5 MG tablet ?Commonly known as: ANTIVERT ?TAKE ONE TABLET BY MOUTH THREE TIMES A DAY AS NEEDED FOR DIZZINESS ?What changed: See the new instructions. ?Notes to patient: dizziness ?  ?methocarbamol 500 MG tablet ?Commonly known as: ROBAXIN ?Take 500 mg by mouth daily as needed (back pain.). ?  ?metoCLOPramide 5 MG tablet ?Commonly known as: REGLAN ?Take 1 tablet (5 mg total) by mouth every 6 (six) hours as needed for nausea. ?Notes to patient: Decrease nausea  ?  ?nitroGLYCERIN 0.4 MG SL tablet ?Commonly known as: NITROSTAT ?Place 1 tablet (0.4 mg total) under the tongue every 5 (five) minutes as needed for chest pain. ?  ?ondansetron 4 MG tablet ?Commonly known as: ZOFRAN ?Take 4 mg by mouth in the morning and at  bedtime. ?Notes to patient: Decreases nausea ?  ?pantoprazole 40 MG tablet ?Commonly known as: PROTONIX ?Take 1 tablet (40 mg total) by mouth daily. ?Notes to patient: Acid reflux ?  ?polyethylene glycol 17 g packet ?Commonly known as: MIRALAX / GLYCOLAX ?Take 17 g by mouth daily at 12 noon. ?Notes to patient: Laxative   ?  ?ranolazine 500 MG 12 hr tablet ?Commonly known as: RANEXA ?Take 1 tablet (500 mg total) by mouth 2 (two) times daily. ?Notes to patient: Decreases chest pain  ?  ?Repatha SureClick 902 MG/ML Soaj ?Generic drug: Evolocumab ?INJECT 1 PEN INTO THE SKIN EVERY 14 DAYS. ?What changed: See the new instructions. ?Notes to patient: Cholesterol  ?  ?tamsulosin 0.4 MG Caps capsule ?Commonly known as: FLOMAX ?Take 0.4 mg by mouth in the morning. ?Notes to patient: prostate ?  ? ?  ? ? ? ? ? ?Nigel Mormon, MD ?Pager: [phone] ?Office: [phone] ? ? ?

## 2022-04-15 NOTE — Progress Notes (Signed)
CARDIAC REHAB PHASE I  ? ?PRE:  Rate/Rhythm: 61  ? ?BP:  Sitting: 118/52     ? ?SaO2: 100 RA ? ?MODE:  Ambulation: 200 ft  ? ?POST:  Rate/Rhythm: 87 ? ?BP:  Sitting: 124/82 ? ?  SaO2: 97 RA ? ?Pt assisted to BR, than ambulated 211f in hallway assist of one with slow, steady gait. Pt denies CP, or SOB. Some dizziness upon rising, biggest limiting factor is hip pain. Pt educated on importance of Plavix and Eliquis. Reviewed signs and symptoms of bleeding. Pt given heart healthy diet. Reviewed site care, restrictions. Encouraged continued mobility with emphasis on safety. Referred to [facility] [place], pt not interested in attending. ? ?[id number] ?TRufina Falco RN BSN ?04/15/2022 ?10:03 AM ? ?

## 2022-04-17 NOTE — Progress Notes (Signed)
Remote ICD transmission.   

## 2022-04-19 DIAGNOSIS — I5022 Chronic systolic (congestive) heart failure: Secondary | ICD-10-CM | POA: Diagnosis not present

## 2022-04-19 DIAGNOSIS — E785 Hyperlipidemia, unspecified: Secondary | ICD-10-CM | POA: Diagnosis not present

## 2022-04-19 DIAGNOSIS — N1831 Chronic kidney disease, stage 3a: Secondary | ICD-10-CM | POA: Diagnosis not present

## 2022-04-19 DIAGNOSIS — I130 Hypertensive heart and chronic kidney disease with heart failure and stage 1 through stage 4 chronic kidney disease, or unspecified chronic kidney disease: Secondary | ICD-10-CM | POA: Diagnosis not present

## 2022-04-22 ENCOUNTER — Ambulatory Visit: Payer: Medicare Other | Admitting: Cardiology

## 2022-04-23 ENCOUNTER — Observation Stay (HOSPITAL_COMMUNITY)
Admission: EM | Admit: 2022-04-23 | Discharge: 2022-04-24 | Disposition: A | Discharge status: Home / Self Care | Payer: Medicare Other | Attending: Internal Medicine | Admitting: Internal Medicine

## 2022-04-23 ENCOUNTER — Encounter: Payer: Self-pay | Admitting: *Deleted

## 2022-04-23 ENCOUNTER — Emergency Department (HOSPITAL_COMMUNITY): Payer: Medicare Other

## 2022-04-23 ENCOUNTER — Other Ambulatory Visit: Payer: Self-pay

## 2022-04-23 ENCOUNTER — Encounter (HOSPITAL_COMMUNITY): Payer: Self-pay

## 2022-04-23 ENCOUNTER — Telehealth: Payer: Self-pay

## 2022-04-23 DIAGNOSIS — I4819 Other persistent atrial fibrillation: Secondary | ICD-10-CM | POA: Diagnosis not present

## 2022-04-23 DIAGNOSIS — E785 Hyperlipidemia, unspecified: Secondary | ICD-10-CM | POA: Diagnosis present

## 2022-04-23 DIAGNOSIS — R0602 Shortness of breath: Secondary | ICD-10-CM | POA: Diagnosis not present

## 2022-04-23 DIAGNOSIS — I5023 Acute on chronic systolic (congestive) heart failure: Secondary | ICD-10-CM

## 2022-04-23 DIAGNOSIS — D649 Anemia, unspecified: Secondary | ICD-10-CM | POA: Diagnosis not present

## 2022-04-23 DIAGNOSIS — I110 Hypertensive heart disease with heart failure: Secondary | ICD-10-CM | POA: Diagnosis not present

## 2022-04-23 DIAGNOSIS — I509 Heart failure, unspecified: Secondary | ICD-10-CM | POA: Diagnosis not present

## 2022-04-23 DIAGNOSIS — I130 Hypertensive heart and chronic kidney disease with heart failure and stage 1 through stage 4 chronic kidney disease, or unspecified chronic kidney disease: Secondary | ICD-10-CM | POA: Diagnosis not present

## 2022-04-23 DIAGNOSIS — I25810 Atherosclerosis of coronary artery bypass graft(s) without angina pectoris: Secondary | ICD-10-CM | POA: Diagnosis not present

## 2022-04-23 DIAGNOSIS — R079 Chest pain, unspecified: Secondary | ICD-10-CM | POA: Diagnosis not present

## 2022-04-23 DIAGNOSIS — R778 Other specified abnormalities of plasma proteins: Secondary | ICD-10-CM | POA: Diagnosis not present

## 2022-04-23 DIAGNOSIS — I10 Essential (primary) hypertension: Secondary | ICD-10-CM | POA: Diagnosis present

## 2022-04-23 DIAGNOSIS — I4821 Permanent atrial fibrillation: Secondary | ICD-10-CM | POA: Diagnosis not present

## 2022-04-23 DIAGNOSIS — Z79899 Other long term (current) drug therapy: Secondary | ICD-10-CM | POA: Diagnosis not present

## 2022-04-23 DIAGNOSIS — Z86718 Personal history of other venous thrombosis and embolism: Secondary | ICD-10-CM | POA: Insufficient documentation

## 2022-04-23 DIAGNOSIS — J90 Pleural effusion, not elsewhere classified: Secondary | ICD-10-CM | POA: Diagnosis not present

## 2022-04-23 DIAGNOSIS — I252 Old myocardial infarction: Secondary | ICD-10-CM | POA: Insufficient documentation

## 2022-04-23 DIAGNOSIS — Z955 Presence of coronary angioplasty implant and graft: Secondary | ICD-10-CM | POA: Insufficient documentation

## 2022-04-23 DIAGNOSIS — Z7902 Long term (current) use of antithrombotics/antiplatelets: Secondary | ICD-10-CM | POA: Diagnosis not present

## 2022-04-23 DIAGNOSIS — Z951 Presence of aortocoronary bypass graft: Secondary | ICD-10-CM | POA: Insufficient documentation

## 2022-04-23 DIAGNOSIS — I2510 Atherosclerotic heart disease of native coronary artery without angina pectoris: Secondary | ICD-10-CM | POA: Diagnosis present

## 2022-04-23 DIAGNOSIS — J9601 Acute respiratory failure with hypoxia: Principal | ICD-10-CM | POA: Diagnosis present

## 2022-04-23 DIAGNOSIS — N1831 Chronic kidney disease, stage 3a: Secondary | ICD-10-CM | POA: Diagnosis not present

## 2022-04-23 DIAGNOSIS — J810 Acute pulmonary edema: Secondary | ICD-10-CM | POA: Insufficient documentation

## 2022-04-23 DIAGNOSIS — Z20822 Contact with and (suspected) exposure to covid-19: Secondary | ICD-10-CM | POA: Insufficient documentation

## 2022-04-23 DIAGNOSIS — I25118 Atherosclerotic heart disease of native coronary artery with other forms of angina pectoris: Secondary | ICD-10-CM | POA: Insufficient documentation

## 2022-04-23 DIAGNOSIS — Z8616 Personal history of COVID-19: Secondary | ICD-10-CM | POA: Insufficient documentation

## 2022-04-23 DIAGNOSIS — R000 Tachycardia, unspecified: Secondary | ICD-10-CM | POA: Diagnosis not present

## 2022-04-23 DIAGNOSIS — Z87891 Personal history of nicotine dependence: Secondary | ICD-10-CM | POA: Diagnosis not present

## 2022-04-23 DIAGNOSIS — Z96643 Presence of artificial hip joint, bilateral: Secondary | ICD-10-CM | POA: Insufficient documentation

## 2022-04-23 DIAGNOSIS — I739 Peripheral vascular disease, unspecified: Secondary | ICD-10-CM | POA: Diagnosis not present

## 2022-04-23 DIAGNOSIS — N400 Enlarged prostate without lower urinary tract symptoms: Secondary | ICD-10-CM | POA: Insufficient documentation

## 2022-04-23 DIAGNOSIS — I5033 Acute on chronic diastolic (congestive) heart failure: Secondary | ICD-10-CM | POA: Diagnosis not present

## 2022-04-23 DIAGNOSIS — I200 Unstable angina: Secondary | ICD-10-CM | POA: Diagnosis not present

## 2022-04-23 DIAGNOSIS — I5043 Acute on chronic combined systolic (congestive) and diastolic (congestive) heart failure: Secondary | ICD-10-CM | POA: Insufficient documentation

## 2022-04-23 DIAGNOSIS — Z7901 Long term (current) use of anticoagulants: Secondary | ICD-10-CM | POA: Insufficient documentation

## 2022-04-23 DIAGNOSIS — R0789 Other chest pain: Secondary | ICD-10-CM | POA: Diagnosis not present

## 2022-04-23 DIAGNOSIS — M109 Gout, unspecified: Secondary | ICD-10-CM | POA: Diagnosis not present

## 2022-04-23 DIAGNOSIS — Z86711 Personal history of pulmonary embolism: Secondary | ICD-10-CM | POA: Diagnosis not present

## 2022-04-23 DIAGNOSIS — E039 Hypothyroidism, unspecified: Secondary | ICD-10-CM | POA: Diagnosis not present

## 2022-04-23 DIAGNOSIS — I25718 Atherosclerosis of autologous vein coronary artery bypass graft(s) with other forms of angina pectoris: Secondary | ICD-10-CM | POA: Diagnosis not present

## 2022-04-23 DIAGNOSIS — J189 Pneumonia, unspecified organism: Secondary | ICD-10-CM | POA: Diagnosis not present

## 2022-04-23 DIAGNOSIS — J9811 Atelectasis: Secondary | ICD-10-CM | POA: Diagnosis not present

## 2022-04-23 DIAGNOSIS — J9600 Acute respiratory failure, unspecified whether with hypoxia or hypercapnia: Secondary | ICD-10-CM | POA: Diagnosis not present

## 2022-04-23 DIAGNOSIS — R42 Dizziness and giddiness: Secondary | ICD-10-CM | POA: Diagnosis not present

## 2022-04-23 LAB — CBC
HCT: 36.6 % — ABNORMAL LOW (ref 39.0–52.0)
Hemoglobin: 12.1 g/dL — ABNORMAL LOW (ref 13.0–17.0)
MCH: 32.8 pg (ref 26.0–34.0)
MCHC: 33.1 g/dL (ref 30.0–36.0)
MCV: 99.2 fL (ref 80.0–100.0)
Platelets: 371 10*3/uL (ref 150–400)
RBC: 3.69 MIL/uL — ABNORMAL LOW (ref 4.22–5.81)
RDW: 14.3 % (ref 11.5–15.5)
WBC: 9.4 10*3/uL (ref 4.0–10.5)
nRBC: 0 % (ref 0.0–0.2)

## 2022-04-23 LAB — BASIC METABOLIC PANEL
Anion gap: 6 (ref 5–15)
BUN: 13 mg/dL (ref 8–23)
CO2: 27 mmol/L (ref 22–32)
Calcium: 8.4 mg/dL — ABNORMAL LOW (ref 8.9–10.3)
Chloride: 99 mmol/L (ref 98–111)
Creatinine, Ser: 1.31 mg/dL — ABNORMAL HIGH (ref 0.61–1.24)
GFR, Estimated: 55 mL/min — ABNORMAL LOW (ref >60)
Glucose, Bld: 118 mg/dL — ABNORMAL HIGH (ref 70–99)
Potassium: 4.1 mmol/L (ref 3.5–5.1)
Sodium: 132 mmol/L — ABNORMAL LOW (ref 135–145)

## 2022-04-23 LAB — TROPONIN I (HIGH SENSITIVITY)
Troponin I (High Sensitivity): 28 ng/L — ABNORMAL HIGH (ref <18)
Troponin I (High Sensitivity): 26 ng/L — ABNORMAL HIGH (ref <18)

## 2022-04-23 LAB — RESP PANEL BY RT-PCR (FLU A&B, COVID) ARPGX2
Influenza A by PCR: NEGATIVE
Influenza B by PCR: NEGATIVE
SARS Coronavirus 2 by RT PCR: NEGATIVE

## 2022-04-23 LAB — PROCALCITONIN: Procalcitonin: <0.1 ng/mL

## 2022-04-23 LAB — BRAIN NATRIURETIC PEPTIDE: B Natriuretic Peptide: 1092.7 pg/mL — ABNORMAL HIGH (ref 0.0–100.0)

## 2022-04-23 MED ORDER — DOXYCYCLINE HYCLATE 100 MG PO TABS
100.0000 mg | ORAL_TABLET | Freq: Two times a day (BID) | ORAL | Status: DC
  Administered 2022-04-23 – 2022-04-24 (×2): 100 mg via ORAL
  Filled 2022-04-23 (×2): qty 1

## 2022-04-23 MED ORDER — POLYETHYLENE GLYCOL 3350 17 G PO PACK
17.0000 g | PACK | Freq: Every day | ORAL | Status: DC
  Filled 2022-04-23: qty 1

## 2022-04-23 MED ORDER — AMIODARONE HCL 200 MG PO TABS
200.0000 mg | ORAL_TABLET | Freq: Every day | ORAL | Status: DC
  Administered 2022-04-23 – 2022-04-24 (×2): 200 mg via ORAL
  Filled 2022-04-23 (×2): qty 1

## 2022-04-23 MED ORDER — NITROGLYCERIN 0.4 MG SL SUBL: 0.4000 mg | SUBLINGUAL_TABLET | SUBLINGUAL | Status: DC | PRN

## 2022-04-23 MED ORDER — ISOSORBIDE MONONITRATE ER 30 MG PO TB24
30.0000 mg | ORAL_TABLET | Freq: Every day | ORAL | Status: DC
  Administered 2022-04-23: 30 mg via ORAL
  Filled 2022-04-23: qty 1

## 2022-04-23 MED ORDER — FUROSEMIDE 10 MG/ML IJ SOLN
20.0000 mg | Freq: Once | INTRAMUSCULAR | Status: AC
  Administered 2022-04-23: 20 mg via INTRAVENOUS
  Filled 2022-04-23: qty 4

## 2022-04-23 MED ORDER — PANTOPRAZOLE SODIUM 40 MG PO TBEC: 40.0000 mg | DELAYED_RELEASE_TABLET | Freq: Every day | ORAL | Status: DC

## 2022-04-23 MED ORDER — SODIUM CHLORIDE (PF) 0.9 % IJ SOLN
INTRAMUSCULAR | Status: AC
  Filled 2022-04-23: qty 50

## 2022-04-23 MED ORDER — RANOLAZINE ER 500 MG PO TB12
500.0000 mg | ORAL_TABLET | Freq: Two times a day (BID) | ORAL | Status: DC
  Administered 2022-04-23 – 2022-04-24 (×2): 500 mg via ORAL
  Filled 2022-04-23 (×3): qty 1

## 2022-04-23 MED ORDER — IOHEXOL 350 MG/ML SOLN
80.0000 mL | Freq: Once | INTRAVENOUS | Status: AC | PRN
  Administered 2022-04-23: 75 mL via INTRAVENOUS

## 2022-04-23 MED ORDER — ACETAMINOPHEN 325 MG PO TABS
650.0000 mg | ORAL_TABLET | Freq: Once | ORAL | Status: AC
  Administered 2022-04-23: 650 mg via ORAL
  Filled 2022-04-23: qty 2

## 2022-04-23 MED ORDER — SODIUM CHLORIDE 0.9 % IV SOLN
100.0000 mg | Freq: Once | INTRAVENOUS | Status: AC
  Administered 2022-04-23: 100 mg via INTRAVENOUS
  Filled 2022-04-23: qty 100

## 2022-04-23 MED ORDER — LIDOCAINE 5 % EX PTCH
1.0000 | MEDICATED_PATCH | CUTANEOUS | Status: DC
  Administered 2022-04-23 – 2022-04-24 (×2): 1 via TRANSDERMAL
  Filled 2022-04-23 (×2): qty 1

## 2022-04-23 MED ORDER — FINASTERIDE 5 MG PO TABS
5.0000 mg | ORAL_TABLET | Freq: Every morning | ORAL | Status: DC
Start: 2022-04-24 — End: 2022-04-24
  Filled 2022-04-23: qty 1

## 2022-04-23 MED ORDER — TAMSULOSIN HCL 0.4 MG PO CAPS
0.4000 mg | ORAL_CAPSULE | Freq: Every morning | ORAL | Status: DC
  Administered 2022-04-24: 0.4 mg via ORAL
  Filled 2022-04-23: qty 1

## 2022-04-23 MED ORDER — SODIUM CHLORIDE 0.9 % IV SOLN
2.0000 g | INTRAVENOUS | Status: DC
  Administered 2022-04-24: 2 g via INTRAVENOUS
  Filled 2022-04-23: qty 20

## 2022-04-23 MED ORDER — MECLIZINE HCL 12.5 MG PO TABS
12.5000 mg | ORAL_TABLET | Freq: Every morning | ORAL | Status: DC
  Administered 2022-04-24: 12.5 mg via ORAL
  Filled 2022-04-23: qty 1

## 2022-04-23 MED ORDER — CLOPIDOGREL BISULFATE 75 MG PO TABS
75.0000 mg | ORAL_TABLET | Freq: Every day | ORAL | Status: DC
  Administered 2022-04-24: 75 mg via ORAL
  Filled 2022-04-23: qty 1

## 2022-04-23 MED ORDER — SODIUM CHLORIDE 0.9% FLUSH
3.0000 mL | Freq: Two times a day (BID) | INTRAVENOUS | Status: DC
  Administered 2022-04-23: 3 mL via INTRAVENOUS

## 2022-04-23 MED ORDER — SODIUM CHLORIDE 0.9 % IV SOLN
1.0000 g | Freq: Once | INTRAVENOUS | Status: AC
  Administered 2022-04-23: 1 g via INTRAVENOUS
  Filled 2022-04-23: qty 10

## 2022-04-23 MED ORDER — BISOPROLOL FUMARATE 5 MG PO TABS
2.5000 mg | ORAL_TABLET | Freq: Every day | ORAL | Status: DC
  Administered 2022-04-23 – 2022-04-24 (×2): 2.5 mg via ORAL
  Filled 2022-04-23 (×2): qty 1

## 2022-04-23 MED ORDER — PANTOPRAZOLE SODIUM 40 MG PO TBEC
40.0000 mg | DELAYED_RELEASE_TABLET | Freq: Every day | ORAL | Status: DC
  Administered 2022-04-23 – 2022-04-24 (×2): 40 mg via ORAL
  Filled 2022-04-23 (×2): qty 1

## 2022-04-23 MED ORDER — ALPRAZOLAM 0.5 MG PO TABS: 0.5000 mg | ORAL_TABLET | Freq: Three times a day (TID) | ORAL | Status: DC | PRN

## 2022-04-23 MED ORDER — FUROSEMIDE 10 MG/ML IJ SOLN
20.0000 mg | Freq: Two times a day (BID) | INTRAMUSCULAR | Status: DC
  Administered 2022-04-23: 20 mg via INTRAVENOUS
  Filled 2022-04-23: qty 2

## 2022-04-23 NOTE — Consult Note (Addendum)
CARDIOLOGY CONSULT NOTE  ?Patient ID: ?Ryan [name] ?MRN: [id number] ?DOB/AGE: October 26, 1941 81 y.o. ? ?Admit date: 04/23/2022 ?Referring Physician  Ripu Rai, MD ?Primary Physician:  Ginger Organ., MD ?Reason for Consultation  CHF and pulmonary edema ? ?Patient ID: Ryan [name], male    DOB: Aug 13, 1941, 81 y.o.   MRN: [id number] ? ?Chief Complaint  ?Patient presents with  ? Chest Pain  ? ?HPI:   ? ?Ryan [name]  is a 81 y.o. Caucasian male patient with CAD SP CABG, with recent worsening dyspnea, chronic diastolic heart failure, severe ischemic cardiomyopathy which has recovered since CABG, has ICD in place, moderate aortic stenosis, PAD with aortobifemoral bypass surgery, moderate carotid disease, stage IIIa chronic kidney disease has failed angioplasty attempt at PCI to circumflex coronary artery on 04/14/2022, now admitted with marked worsening dyspnea, similar symptoms as to prior to angioplasty. ? ?States that since recent hospital discharge after partially successful angioplasty to circumflex coronary artery, he felt well for about 2 to 3 days then started again having severe dyspnea even with walking few steps in his house or in his driveway.  No chest pain but had to stop frequently.  He also started noticing worsening dyspnea even at rest and hence called EMS today.  Noticed leg edema and orthopnea as well. ? ?On further questioning she also states that his entire right leg including the hip hurts even to walk minimal distance and this is chronic.  No rest pain. ? ?No syncope, tolerating anticoagulation without bleeding diathesis, no neurologic deficits. ? ?Past Medical History:  ?Diagnosis Date  ? Acquired thrombophilia ([place]) 11/01/2021  ? Arthritis   ? CAD (coronary artery disease)   ? Carotid artery occlusion   ? Cataract   ? 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 CHF (congestive heart failure) ([place])   ? Chronic back pain   ? COVID-19 10/31/2021  ? DVT (deep venous thrombosis) ([place])   ? Dysrhythmia   ?  Enlarged prostate   ? takes Rapaflo daily  ? GERD (gastroesophageal reflux disease)   ? occasional  ? History of colon polyps   ? History of gout   ? has colchicine prn  ? History of kidney stones   ? Hyperlipidemia   ? takes Crestor daily  ? Hypertension   ? takes Amlodipine daily  ? Hypothyroidism   ? Myocardial infarction [facility])   ? Peripheral vascular disease ([place])   ? Prolonged QT interval 11/01/2021  ? Pulmonary emboli ([place]) 03/20/2015  ? elevated d-dimer, intermediate V/Q study, atypical chest pain and SOB. Start on Xarelto '20mg'$  BID for 3 month  ? Rapid atrial fibrillation ([place])   ? Renal insufficiency   ? Shortness of breath dyspnea   ? Urinary frequency   ? Urinary urgency   ? ?Past Surgical History:  ?Procedure Laterality Date  ? APPENDECTOMY    ? BACK SURGERY    ? 5 times  ? big toe surgery    ? CARDIAC CATHETERIZATION    ? 2010    dr Acie Fredrickson  ? cataract surgery    ? left eye  ? CHOLECYSTECTOMY N/A 07/27/2016  ? Procedure: LAPAROSCOPIC CHOLECYSTECTOMY;  Surgeon: Mickeal Skinner, MD;  Location: [place];  Service: General;  Laterality: N/A;  ? COLONOSCOPY    ? CORONARY ARTERY BYPASS GRAFT N/A 04/05/2015  ? Procedure: CORONARY ARTERY BYPASS GRAFTING (CABG)X4 LIMA-LAD; SVG-DIAG1-DIAG2; SVG-PD;  Surgeon: [name], MD;  Location: [place];  Service: Open Heart Surgery;  Laterality: N/A;  ? CORONARY BALLOON ANGIOPLASTY N/A 04/14/2022  ? Procedure: CORONARY BALLOON ANGIOPLASTY;  Surgeon: Nigel Mormon, MD;  Location: [place] CV LAB;  Service: Cardiovascular;  Laterality: N/A;  ? CORONARY/GRAFT ANGIOGRAPHY N/A 04/22/2018  ? Procedure: CORONARY/GRAFT ANGIOGRAPHY;  Surgeon: Martinique, Peter M, MD;  Location: [place] CV LAB;  Service: Cardiovascular;  Laterality: N/A;  ? CYSTOSCOPY    ? ENDARTERECTOMY Left 04/24/2016  ? Procedure: ENDARTERECTOMY LEFT CAROTID;  Surgeon: Mal Misty, MD;  Location: [place];  Service: Vascular;  Laterality: Left;  ? EYE SURGERY    ? FEMORAL ARTERY - POPLITEAL ARTERY BYPASS  GRAFT    ? ICD IMPLANT N/A 12/30/2020  ? Procedure: ICD IMPLANT;  Surgeon: [place] Lance, MD;  Location: [place] CV LAB;  Service: Cardiovascular;  Laterality: N/A;  ? INTRAVASCULAR ULTRASOUND/IVUS N/A 04/14/2022  ? Procedure: Intravascular Ultrasound/IVUS;  Surgeon: Nigel Mormon, MD;  Location: [place] CV LAB;  Service: Cardiovascular;  Laterality: N/A;  ? JOINT REPLACEMENT    ? shoulder  ? LEFT HEART CATH N/A 04/14/2022  ? Procedure: Left Heart Cath;  Surgeon: Nigel Mormon, MD;  Location: [place] CV LAB;  Service: Cardiovascular;  Laterality: N/A;  ? LEFT HEART CATHETERIZATION WITH CORONARY ANGIOGRAM N/A 04/03/2015  ? Procedure: LEFT HEART CATHETERIZATION WITH CORONARY ANGIOGRAM;  Surgeon: [place] Sine, MD;  Location: [facility] CATH LAB;  Service: Cardiovascular;  Laterality: N/A;  ? LUMBAR LAMINECTOMY  01/06/2013  ? Procedure: MICRODISCECTOMY LUMBAR LAMINECTOMY;  Surgeon: Marybelle Killings, MD;  Location: [place];  Service: Orthopedics;  Laterality: N/A;  L3-4 decompression  ? LUMBAR LAMINECTOMY/DECOMPRESSION MICRODISCECTOMY  02/12/2012  ? Procedure: LUMBAR LAMINECTOMY/DECOMPRESSION MICRODISCECTOMY;  Surgeon: Floyce Stakes, MD;  Location: [place] [facility];  Service: Neurosurgery;  Laterality: N/A;  Lumbar four-five laminectomy  ? PATCH ANGIOPLASTY Left 04/24/2016  ? Procedure: LEFT CAROTID ARTERY PATCH ANGIOPLASTY;  Surgeon: Mal Misty, MD;  Location: [place];  Service: Vascular;  Laterality: Left;  ? RIGHT HEART CATH AND CORONARY/GRAFT ANGIOGRAPHY N/A 01/30/2019  ? Procedure: RIGHT HEART CATH AND CORONARY/GRAFT ANGIOGRAPHY;  Surgeon: [place] Sine, MD;  Location: [place] CV LAB;  Service: Cardiovascular;  Laterality: N/A;  ? RIGHT/LEFT HEART CATH AND CORONARY ANGIOGRAPHY N/A 03/17/2022  ? Procedure: RIGHT/LEFT HEART CATH AND CORONARY ANGIOGRAPHY;  Surgeon: [place] Mormon, MD;  Location: [place] CV LAB;  Service: Cardiovascular;  Laterality: N/A;  ? STERIOD INJECTION Right 01/09/2014  ?  Procedure: STEROID INJECTION;  Surgeon: Mcarthur Rossetti, MD;  Location: [place];  Service: Orthopedics;  Laterality: Right;  ? TEE WITHOUT CARDIOVERSION N/A 04/05/2015  ? Procedure: TRANSESOPHAGEAL ECHOCARDIOGRAM (TEE);  Surgeon: [name], MD;  Location: [place];  Service: Open Heart Surgery;  Laterality: N/A;  ? TOTAL HIP ARTHROPLASTY Left 01/09/2014  ? DR Ninfa Linden  ? TOTAL HIP ARTHROPLASTY Left 01/09/2014  ? Procedure: LEFT TOTAL HIP ARTHROPLASTY ANTERIOR APPROACH and Steroid Injection Right hip;  Surgeon: Mcarthur Rossetti, MD;  Location: [place];  Service: Orthopedics;  Laterality: Left;  ? TOTAL HIP ARTHROPLASTY Right 08/15/2019  ? TOTAL HIP ARTHROPLASTY Right 08/15/2019  ? Procedure: RIGHT TOTAL HIP ARTHROPLASTY ANTERIOR APPROACH;  Surgeon: Mcarthur Rossetti, MD;  Location: [place];  Service: Orthopedics;  Laterality: Right;  ? ?Social History  ? ?Tobacco Use  ? Smoking status: Former  ?  Packs/day: 1.00  ?  Years: 25.00  ?  Pack years: 25.00  ?  Types: Cigarettes  ?  Quit date:  02/04/1987  ?  Years since quitting: 35.2  ? Smokeless tobacco: Former  ?  Types: Chew  ?  Quit date: 07/20/2009  ? Tobacco comments:  ?  quit 35+yrs ago  ?Substance Use Topics  ? Alcohol use: No  ?  Alcohol/week: 0.0 standard drinks  ?  ?Family History  ?Problem Relation Age of Onset  ? Heart disease Father   ? Heart attack Father   ? Heart disease Sister   ? Hypertension Sister   ? Heart attack Sister   ? Hypertension Mother   ? Diabetes Son   ?  ?Marital Status: Married  ?ROS  ?Review of Systems  ?Cardiovascular:  Positive for claudication, dyspnea on exertion, leg swelling and orthopnea. Negative for chest pain.  ?Objective  ? ? ?  04/23/2022  ?  6:00 PM 04/23/2022  ?  5:00 PM 04/23/2022  ?  4:30 PM  ?Vitals with BMI  ?Systolic [phone]  ?Diastolic 78 66 66  ?Pulse 66 65 65  ?  ?Blood pressure (!) 151/78, pulse 66, temperature 97.7 ?F (36.5 ?C), temperature source Oral, resp. rate 20, height '5\' 6"'$  (1.676 m), weight 63  kg, SpO2 95 %.  ?Physical Exam ?Neck:  ?   Vascular: No JVD.  ?Cardiovascular:  ?   Rate and Rhythm: Normal rate. Rhythm irregular.  ?   Pulses: Intact distal pulses.     ?     Carotid pulses are  on

## 2022-04-23 NOTE — Progress Notes (Addendum)
HOSPITAL MEDICINE OVERNIGHT EVENT NOTE   ? ?According to pharmacy, patient is requesting that pantoprazole be discontinued.  He states that he does not regularly take it at home.   ? ?Chart reviewed.  Patient is on anticoagulants initiated during a hospitalization in late April.  Patient has no history of gastrointestinal bleeding but may be on a PPI to minimize the risk of complications. ? ?We will encourage that this medication be continued for now, day provider can discontinue at their discretion if patient insists. ? ?Vernelle Emerald  MD ?[facility]  ? ? ? ? ? ? ? ? ? ? ?

## 2022-04-23 NOTE — ED Notes (Signed)
[facility] at bedside 

## 2022-04-23 NOTE — H&P (View-Only) (Signed)
CARDIOLOGY CONSULT NOTE  ?Patient ID: ?Angus Palms Harwood ?MRN: [id number] ?DOB/AGE: [birth year] 81 y.o. ? ?Admit date: 04/23/2022 ?Referring Physician  Ripu Rai, MD ?Primary Physician:  Ginger Organ., MD ?Reason for Consultation  CHF and pulmonary edema ? ?Patient ID: GOBLE FUDALA, male    DOB: April 10, 1941, 81 y.o.   MRN: [id number] ? ?Chief Complaint  ?Patient presents with  ? Chest Pain  ? ?HPI:   ? ?ADNAN VANVOORHIS  is a 81 y.o. Caucasian male patient with CAD SP CABG, with recent worsening dyspnea, chronic diastolic heart failure, severe ischemic cardiomyopathy which has recovered since CABG, has ICD in place, moderate aortic stenosis, PAD with aortobifemoral bypass surgery, moderate carotid disease, stage IIIa chronic kidney disease has failed angioplasty attempt at PCI to circumflex coronary artery on 04/14/2022, now admitted with marked worsening dyspnea, similar symptoms as to prior to angioplasty. ? ?States that since recent hospital discharge after partially successful angioplasty to circumflex coronary artery, he felt well for about 2 to 3 days then started again having severe dyspnea even with walking few steps in his house or in his driveway.  No chest pain but had to stop frequently.  He also started noticing worsening dyspnea even at rest and hence called EMS today.  Noticed leg edema and orthopnea as well. ? ?On further questioning she also states that his entire right leg including the hip hurts even to walk minimal distance and this is chronic.  No rest pain. ? ?No syncope, tolerating anticoagulation without bleeding diathesis, no neurologic deficits. ? ?Past Medical History:  ?Diagnosis Date  ? Acquired thrombophilia ([place]) 11/01/2021  ? Arthritis   ? CAD (coronary artery disease)   ? Carotid artery occlusion   ? Cataract   ? 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 CHF (congestive heart failure) ([place])   ? Chronic back pain   ? COVID-19 10/31/2021  ? DVT (deep venous thrombosis) ([place])   ? Dysrhythmia   ?  Enlarged prostate   ? takes Rapaflo daily  ? GERD (gastroesophageal reflux disease)   ? occasional  ? History of colon polyps   ? History of gout   ? has colchicine prn  ? History of kidney stones   ? Hyperlipidemia   ? takes Crestor daily  ? Hypertension   ? takes Amlodipine daily  ? Hypothyroidism   ? Myocardial infarction [facility])   ? Peripheral vascular disease ([place])   ? Prolonged QT interval 11/01/2021  ? Pulmonary emboli ([place]) 03/20/2015  ? elevated d-dimer, intermediate V/Q study, atypical chest pain and SOB. Start on Xarelto '20mg'$  BID for 3 month  ? Rapid atrial fibrillation ([place])   ? Renal insufficiency   ? Shortness of breath dyspnea   ? Urinary frequency   ? Urinary urgency   ? ?Past Surgical History:  ?Procedure Laterality Date  ? APPENDECTOMY    ? BACK SURGERY    ? 5 times  ? big toe surgery    ? CARDIAC CATHETERIZATION    ? 2010    dr Acie Fredrickson  ? cataract surgery    ? left eye  ? CHOLECYSTECTOMY N/A 07/27/2016  ? Procedure: LAPAROSCOPIC CHOLECYSTECTOMY;  Surgeon: Mickeal Skinner, MD;  Location: [place];  Service: General;  Laterality: N/A;  ? COLONOSCOPY    ? CORONARY ARTERY BYPASS GRAFT N/A 04/05/2015  ? Procedure: CORONARY ARTERY BYPASS GRAFTING (CABG)X4 LIMA-LAD; SVG-DIAG1-DIAG2; SVG-PD;  Surgeon: [name], MD;  Location: [place];  Service: Open Heart Surgery;  Laterality: N/A;  ? CORONARY BALLOON ANGIOPLASTY N/A 04/14/2022  ? Procedure: CORONARY BALLOON ANGIOPLASTY;  Surgeon: [place]el Mormon, MD;  Location: [place] CV LAB;  Service: Cardiovascular;  Laterality: N/A;  ? CORONARY/GRAFT ANGIOGRAPHY N/A 04/22/2018  ? Procedure: CORONARY/GRAFT ANGIOGRAPHY;  Surgeon: [place], Peter M, MD;  Location: [place] CV LAB;  Service: Cardiovascular;  Laterality: N/A;  ? CYSTOSCOPY    ? ENDARTERECTOMY Left 04/24/2016  ? Procedure: ENDARTERECTOMY LEFT CAROTID;  Surgeon: Mal Misty, MD;  Location: [place];  Service: Vascular;  Laterality: Left;  ? EYE SURGERY    ? FEMORAL ARTERY - POPLITEAL ARTERY BYPASS  GRAFT    ? ICD IMPLANT N/A 12/30/2020  ? Procedure: ICD IMPLANT;  Surgeon: [place] Lance, MD;  Location: [place] CV LAB;  Service: Cardiovascular;  Laterality: N/A;  ? INTRAVASCULAR ULTRASOUND/IVUS N/A 04/14/2022  ? Procedure: Intravascular Ultrasound/IVUS;  Surgeon: Nigel Mormon, MD;  Location: [place] CV LAB;  Service: Cardiovascular;  Laterality: N/A;  ? JOINT REPLACEMENT    ? shoulder  ? LEFT HEART CATH N/A 04/14/2022  ? Procedure: Left Heart Cath;  Surgeon: Nigel Mormon, MD;  Location: [place] CV LAB;  Service: Cardiovascular;  Laterality: N/A;  ? LEFT HEART CATHETERIZATION WITH CORONARY ANGIOGRAM N/A 04/03/2015  ? Procedure: LEFT HEART CATHETERIZATION WITH CORONARY ANGIOGRAM;  Surgeon: [place] Sine, MD;  Location: [facility] CATH LAB;  Service: Cardiovascular;  Laterality: N/A;  ? LUMBAR LAMINECTOMY  01/06/2013  ? Procedure: MICRODISCECTOMY LUMBAR LAMINECTOMY;  Surgeon: Marybelle Killings, MD;  Location: [place];  Service: Orthopedics;  Laterality: N/A;  L3-4 decompression  ? LUMBAR LAMINECTOMY/DECOMPRESSION MICRODISCECTOMY  02/12/2012  ? Procedure: LUMBAR LAMINECTOMY/DECOMPRESSION MICRODISCECTOMY;  Surgeon: Floyce Stakes, MD;  Location: [place] [facility];  Service: Neurosurgery;  Laterality: N/A;  Lumbar four-five laminectomy  ? PATCH ANGIOPLASTY Left 04/24/2016  ? Procedure: LEFT CAROTID ARTERY PATCH ANGIOPLASTY;  Surgeon: [place] Misty, MD;  Location: [place];  Service: Vascular;  Laterality: Left;  ? RIGHT HEART CATH AND CORONARY/GRAFT ANGIOGRAPHY N/A 01/30/2019  ? Procedure: RIGHT HEART CATH AND CORONARY/GRAFT ANGIOGRAPHY;  Surgeon: [place] Sine, MD;  Location: [place] CV LAB;  Service: Cardiovascular;  Laterality: N/A;  ? RIGHT/LEFT HEART CATH AND CORONARY ANGIOGRAPHY N/A 03/17/2022  ? Procedure: RIGHT/LEFT HEART CATH AND CORONARY ANGIOGRAPHY;  Surgeon: Nigel Mormon, MD;  Location: [place] CV LAB;  Service: Cardiovascular;  Laterality: N/A;  ? STERIOD INJECTION Right 01/09/2014  ?  Procedure: STEROID INJECTION;  Surgeon: Mcarthur Rossetti, MD;  Location: [place];  Service: Orthopedics;  Laterality: Right;  ? TEE WITHOUT CARDIOVERSION N/A 04/05/2015  ? Procedure: TRANSESOPHAGEAL ECHOCARDIOGRAM (TEE);  Surgeon: [name], MD;  Location: [place];  Service: Open Heart Surgery;  Laterality: N/A;  ? TOTAL HIP ARTHROPLASTY Left 01/09/2014  ? DR Ninfa Linden  ? TOTAL HIP ARTHROPLASTY Left 01/09/2014  ? Procedure: LEFT TOTAL HIP ARTHROPLASTY ANTERIOR APPROACH and Steroid Injection Right hip;  Surgeon: Mcarthur Rossetti, MD;  Location: [place];  Service: Orthopedics;  Laterality: Left;  ? TOTAL HIP ARTHROPLASTY Right 08/15/2019  ? TOTAL HIP ARTHROPLASTY Right 08/15/2019  ? Procedure: RIGHT TOTAL HIP ARTHROPLASTY ANTERIOR APPROACH;  Surgeon: Mcarthur Rossetti, MD;  Location: [place];  Service: Orthopedics;  Laterality: Right;  ? ?Social History  ? ?Tobacco Use  ? Smoking status: Former  ?  Packs/day: 1.00  ?  Years: 25.00  ?  Pack years: 25.00  ?  Types: Cigarettes  ?  Quit date:  02/04/1987  ?  Years since quitting: 35.2  ? Smokeless tobacco: Former  ?  Types: Chew  ?  Quit date: 07/20/2009  ? Tobacco comments:  ?  quit 35+yrs ago  ?Substance Use Topics  ? Alcohol use: No  ?  Alcohol/week: 0.0 standard drinks  ?  ?Family History  ?Problem Relation Age of Onset  ? Heart disease Father   ? Heart attack Father   ? Heart disease Sister   ? Hypertension Sister   ? Heart attack Sister   ? Hypertension Mother   ? Diabetes Son   ?  ?Marital Status: Married  ?ROS  ?Review of Systems  ?Cardiovascular:  Positive for claudication, dyspnea on exertion, leg swelling and orthopnea. Negative for chest pain.  ?Objective  ? ? ?  04/23/2022  ?  6:00 PM 04/23/2022  ?  5:00 PM 04/23/2022  ?  4:30 PM  ?Vitals with BMI  ?Systolic [phone]  ?Diastolic 78 66 66  ?Pulse 66 65 65  ?  ?Blood pressure (!) 151/78, pulse 66, temperature 97.7 ?F (36.5 ?C), temperature source Oral, resp. rate 20, height '5\' 6"'$  (1.676 m), weight 63  kg, SpO2 95 %.  ?Physical Exam ?Neck:  ?   Vascular: No JVD.  ?Cardiovascular:  ?   Rate and Rhythm: Normal rate. Rhythm irregular.  ?   Pulses: Intact distal pulses.     ?     Carotid pulses are  on

## 2022-04-23 NOTE — Telephone Encounter (Signed)
Patient called and stated that he was having severe SOB since 5 o'clock this morning to the point where he was scared he was "going to die" and now has a lemon sized lump on his chest and chest pain". I advised patient, if he is feeling that bad, he really needs to go to the ED. Patient stated that the last time he went they had him sit in the waiting room for 8hrs before being called back. I suggested going by ambulance may help get him in quicker he said that the ambulance always has a hard and long time finding his home and when they do, they have a hard time getting to it. He asked if his wife could drive him here to our clinic and have [place] call 911 from here and he will wait, because it will be quicker than waiting at his home. ?

## 2022-04-23 NOTE — Telephone Encounter (Signed)
This encounter was created in error - please disregard.  This encounter was created in error - please disregard.

## 2022-04-23 NOTE — H&P (Addendum)
?History and Physical  ? ? ?Patient: Ryan [name] DOB: May 26, 1941 ?DOA: 04/23/2022 ?DOS: the patient was seen and examined on 04/23/2022 ?PCP: Ginger Organ., MD  ?Patient coming from: Home ? ?Chief Complaint:  ?Chief Complaint  ?Patient presents with  ? Chest Pain  ? ?HPI: Ryan [name] is a 81 y.o. male with medical history significant of CAD status post CABG, persistent A-fib on Eliquis, PAD status post aortofemoral bypass on Plavix, moderate carotid disease, prior history of stroke, CKD stage III presented to ED with acute shortness of breath.  Patient reported that he woke up at 5 AM in the morning, with acute shortness of breath and unable to catch breath.  He also had left-sided chest tightness.  Patient has had progressive dyspnea since Tuesday, he felt that his symptoms improved somewhat after his wife gave him an inhaler this morning.  He also feels that his left ankle is swollen.  No fevers, no productive cough. ? ?Patient was admitted on 04/14/2022-04/15/2022 by cardiology, Dr. Kipp Brood at [facility] for chest pain and acute on chronic HFpEF.  Patient had a successful PCI attempt and was recommended eliquis and Plavix for 3 months. ? ?Patient also reports that he had a fall 2 weeks ago when he missed a step while fixing the cable for his TV and landed on his left side. ? ?ED course ?In ED, temp 97.7, respiratory rate 17, pulse 57, BP 152/75, O2 sats 99% on room air ?Sodium 132, BUN 13, creatinine 1.3, BNP 1092.7 ?Troponin 28-26 ? ?WBCs 9.4, hemoglobin 12.1 ?CT angiogram chest showed no evidence of PE, moderate size stable layering bilateral pleural effusions with associated compressive atelectasis, suggestive of pulmonary edema, cardiomegaly ? ?Chest x-ray showed right lower lobe airspace disease concerning for pneumonia, mild hazy left lower lobe airspace disease reflecting atelectasis versus pneumonia ? ?EKG showed rate 67, atrial flutter, QTc prolonged, 534.  No acute ST-T wave  changes suggestive of ischemia ? ? ?Review of Systems: As mentioned in the history of present illness. All other systems reviewed and are negative. ?Past Medical History:  ?Diagnosis Date  ? Acquired thrombophilia ([place]) 11/01/2021  ? Arthritis   ? CAD (coronary artery disease)   ? Carotid artery occlusion   ? Cataract   ? 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 CHF (congestive heart failure) ([place])   ? Chronic back pain   ? COVID-19 10/31/2021  ? DVT (deep venous thrombosis) ([place])   ? Dysrhythmia   ? Enlarged prostate   ? takes Rapaflo daily  ? GERD (gastroesophageal reflux disease)   ? occasional  ? History of colon polyps   ? History of gout   ? has colchicine prn  ? History of kidney stones   ? Hyperlipidemia   ? takes Crestor daily  ? Hypertension   ? takes Amlodipine daily  ? Hypothyroidism   ? Myocardial infarction [facility])   ? Peripheral vascular disease ([place])   ? Prolonged QT interval 11/01/2021  ? Pulmonary emboli ([place]) 03/20/2015  ? elevated d-dimer, intermediate V/Q study, atypical chest pain and SOB. Start on Xarelto '20mg'$  BID for 3 month  ? Rapid atrial fibrillation ([place])   ? Renal insufficiency   ? Shortness of breath dyspnea   ? Urinary frequency   ? Urinary urgency   ? ?Past Surgical History:  ?Procedure Laterality Date  ? APPENDECTOMY    ? BACK SURGERY    ? 5 times  ? big toe surgery    ?  CARDIAC CATHETERIZATION    ? 2010    dr Acie Fredrickson  ? cataract surgery    ? left eye  ? CHOLECYSTECTOMY N/A 07/27/2016  ? Procedure: LAPAROSCOPIC CHOLECYSTECTOMY;  Surgeon: Mickeal Skinner, MD;  Location: [place];  Service: General;  Laterality: N/A;  ? COLONOSCOPY    ? CORONARY ARTERY BYPASS GRAFT N/A 04/05/2015  ? Procedure: CORONARY ARTERY BYPASS GRAFTING (CABG)X4 LIMA-LAD; SVG-DIAG1-DIAG2; SVG-PD;  Surgeon: [name], MD;  Location: [place];  Service: Open Heart Surgery;  Laterality: N/A;  ? CORONARY BALLOON ANGIOPLASTY N/A 04/14/2022  ? Procedure: CORONARY BALLOON ANGIOPLASTY;  Surgeon: Nigel Mormon, MD;   Location: [place] CV LAB;  Service: Cardiovascular;  Laterality: N/A;  ? CORONARY/GRAFT ANGIOGRAPHY N/A 04/22/2018  ? Procedure: CORONARY/GRAFT ANGIOGRAPHY;  Surgeon: Martinique, Peter M, MD;  Location: [place] CV LAB;  Service: Cardiovascular;  Laterality: N/A;  ? CYSTOSCOPY    ? ENDARTERECTOMY Left 04/24/2016  ? Procedure: ENDARTERECTOMY LEFT CAROTID;  Surgeon: Mal Misty, MD;  Location: [place];  Service: Vascular;  Laterality: Left;  ? EYE SURGERY    ? FEMORAL ARTERY - POPLITEAL ARTERY BYPASS GRAFT    ? ICD IMPLANT N/A 12/30/2020  ? Procedure: ICD IMPLANT;  Surgeon: Evans Lance, MD;  Location: [place] CV LAB;  Service: Cardiovascular;  Laterality: N/A;  ? INTRAVASCULAR ULTRASOUND/IVUS N/A 04/14/2022  ? Procedure: Intravascular Ultrasound/IVUS;  Surgeon: Nigel Mormon, MD;  Location: [place] CV LAB;  Service: Cardiovascular;  Laterality: N/A;  ? JOINT REPLACEMENT    ? shoulder  ? LEFT HEART CATH N/A 04/14/2022  ? Procedure: Left Heart Cath;  Surgeon: Nigel Mormon, MD;  Location: [place] CV LAB;  Service: Cardiovascular;  Laterality: N/A;  ? LEFT HEART CATHETERIZATION WITH CORONARY ANGIOGRAM N/A 04/03/2015  ? Procedure: LEFT HEART CATHETERIZATION WITH CORONARY ANGIOGRAM;  Surgeon: Troy Sine, MD;  Location: [facility] CATH LAB;  Service: Cardiovascular;  Laterality: N/A;  ? LUMBAR LAMINECTOMY  01/06/2013  ? Procedure: MICRODISCECTOMY LUMBAR LAMINECTOMY;  Surgeon: Marybelle Killings, MD;  Location: [place];  Service: Orthopedics;  Laterality: N/A;  L3-4 decompression  ? LUMBAR LAMINECTOMY/DECOMPRESSION MICRODISCECTOMY  02/12/2012  ? Procedure: LUMBAR LAMINECTOMY/DECOMPRESSION MICRODISCECTOMY;  Surgeon: Floyce Stakes, MD;  Location: [place] [facility];  Service: Neurosurgery;  Laterality: N/A;  Lumbar four-five laminectomy  ? PATCH ANGIOPLASTY Left 04/24/2016  ? Procedure: LEFT CAROTID ARTERY PATCH ANGIOPLASTY;  Surgeon: Mal Misty, MD;  Location: [place];  Service: Vascular;  Laterality: Left;  ? RIGHT  HEART CATH AND CORONARY/GRAFT ANGIOGRAPHY N/A 01/30/2019  ? Procedure: RIGHT HEART CATH AND CORONARY/GRAFT ANGIOGRAPHY;  Surgeon: Troy Sine, MD;  Location: [place] CV LAB;  Service: Cardiovascular;  Laterality: N/A;  ? RIGHT/LEFT HEART CATH AND CORONARY ANGIOGRAPHY N/A 03/17/2022  ? Procedure: RIGHT/LEFT HEART CATH AND CORONARY ANGIOGRAPHY;  Surgeon: Nigel Mormon, MD;  Location: [place] CV LAB;  Service: Cardiovascular;  Laterality: N/A;  ? STERIOD INJECTION Right 01/09/2014  ? Procedure: STEROID INJECTION;  Surgeon: Mcarthur Rossetti, MD;  Location: [place];  Service: Orthopedics;  Laterality: Right;  ? TEE WITHOUT CARDIOVERSION N/A 04/05/2015  ? Procedure: TRANSESOPHAGEAL ECHOCARDIOGRAM (TEE);  Surgeon: [name], MD;  Location: [place];  Service: Open Heart Surgery;  Laterality: N/A;  ? TOTAL HIP ARTHROPLASTY Left 01/09/2014  ? DR Ninfa Linden  ? TOTAL HIP ARTHROPLASTY Left 01/09/2014  ? Procedure: LEFT TOTAL HIP ARTHROPLASTY ANTERIOR APPROACH and Steroid Injection Right hip;  Surgeon: Mcarthur Rossetti, MD;  Location: [place];  Service: Orthopedics;  Laterality: Left;  ? TOTAL HIP ARTHROPLASTY Right 08/15/2019  ? TOTAL HIP ARTHROPLASTY Right 08/15/2019  ? Procedure: RIGHT TOTAL HIP ARTHROPLASTY ANTERIOR APPROACH;  Surgeon: Mcarthur Rossetti, MD;  Location: [place];  Service: Orthopedics;  Laterality: Right;  ? ?Social History:  reports that he quit smoking about 35 years ago. His smoking use included cigarettes. He has a 25.00 pack-year smoking history. He quit smokeless tobacco use about 12 years ago.  His smokeless tobacco use included chew. He reports that he does not drink alcohol and does not use drugs. ? ?Allergies  ?Allergen Reactions  ? Jardiance [Empagliflozin] Nausea Only and Other (See Comments)  ?  Made patient very sick to the stomach.  ? Ezetimibe Other (See Comments)  ?  Myalgias   ? Ace Inhibitors Cough  ?  Other reaction(s): cough  ? Aspirin Other (See Comments)  ?   skin rash/easy bruising  ? Atorvastatin Other (See Comments)  ?  weakness, fatigue (severe)  ? Ezetimibe-Simvastatin Other (See Comments)  ?  myalgias  ? Rosuvastatin Other (See Comments)  ?  Myalgias/weakness

## 2022-04-23 NOTE — ED Provider Notes (Signed)
?[place] DEPT ?Provider Note ? ? ?CSN: [id number] ?Arrival date & time: 04/23/22  1026 ? ?  ? ?History ? ?Chief Complaint  ?Patient presents with  ? Chest Pain  ? ? ?Ryan [name] is a 81 y.o. male. ? ? Patient as above with significant medical history as below, including CAD, hypertension, DVT, PE, prostatomegaly who presents to the ED with complaint of dyspnea, chest pain. ? ?Reports aggressive dyspnea since Tuesday.  Episodes of been intermittent.  Associated with chest tightness.  He woke up suddenly this morning around 5 AM with severe dyspnea.  Unable to catch his breath.  "I thought I was going to die."  Symptoms have improved after arrival to the ED.  Chest pain is improved as well.  He initially was requiring some oxygen but no longer. Feels like his left ankle is swollen.  EMS gave patient nitro but patient refused aspirin 2/2 allergy. Pt reports difficulty with urinating, has to use in and out cath at home, follows with Dr Abner Greenspan; scheduled for bladder surgery upcoming.  ? ?No melena or BRBPR, no abd pain, no emesis, compliant with home medications.  ? ? ? ? ?Past Medical History: ?11/01/2021: Acquired thrombophilia ([place]) ?No date: Arthritis ?No date: CAD (coronary artery disease) ?No date: Carotid artery occlusion ?No date: Cataract ?    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    Comment:  takes Rapaflo daily ?No date: GERD (gastroesophageal reflux disease) ?    Comment:  occasional ?No date: History of colon polyps ?No date: History of gout ?    Comment:  has colchicine prn ?No date: History of kidney stones ?No date: Hyperlipidemia ?    Comment:  takes Crestor daily ?No date: Hypertension ?    Comment:  takes Amlodipine daily ?No date: Hypothyroidism ?No date: Myocardial infarction  [facility]) ?No date: Peripheral vascular disease ([place]) ?11/01/2021: Prolonged QT interval ?03/20/2015: Pulmonary emboli ([place]) ?    Comment:  elevated d-dimer, intermediate V/Q study, atypical chest ?             pain and SOB. Start on Xarelto '20mg'$  BID for 3 month ?No date: Rapid atrial fibrillation ([place]) ?No date: Renal insufficiency ?No date: Shortness of breath dyspnea ?No date: Urinary frequency ?No date: Urinary urgency ? ?Past Surgical History: ?No date: APPENDECTOMY ?No date: BACK SURGERY ?    Comment:  5 times ?No date: big toe surgery ?No date: CARDIAC CATHETERIZATION ?    Comment:  2010    dr Acie Fredrickson ?No date: cataract surgery ?    Comment:  left eye ?07/27/2016: CHOLECYSTECTOMY; N/A ?    Comment:  Procedure: LAPAROSCOPIC CHOLECYSTECTOMY;  Surgeon: Lurena Joiner  ?             Sondra Come, MD;  Location: [place];  Service: General; ?             Laterality: N/A; ?No date: COLONOSCOPY ?04/05/2015: CORONARY ARTERY BYPASS GRAFT; N/A ?    Comment:  Procedure: CORONARY ARTERY BYPASS GRAFTING (CABG)X4  ?             LIMA-LAD; SVG-DIAG1-DIAG2; SVG-PD;  Surgeon: Revonda Standard  ?             Roxan Hockey, MD;  Location: [place];  Service: Open Heart  ?  Surgery;  Laterality: N/A; ?04/14/2022: CORONARY BALLOON ANGIOPLASTY; N/A ?    Comment:  Procedure: CORONARY BALLOON ANGIOPLASTY;  Surgeon:  ?             Patwardhan, Reynold Bowen, MD;  Location: [place] CV LAB;  ?             Service: Cardiovascular;  Laterality: N/A; ?04/22/2018: CORONARY/GRAFT ANGIOGRAPHY; N/A ?    Comment:  Procedure: CORONARY/GRAFT ANGIOGRAPHY;  Surgeon: Martinique, ?             Ander Slade, MD;  Location: [place] CV LAB;  Service:  ?             Cardiovascular;  Laterality: N/A; ?No date: CYSTOSCOPY ?04/24/2016: ENDARTERECTOMY; Left ?    Comment:  Procedure: ENDARTERECTOMY LEFT CAROTID;  Surgeon: Jeneen Rinks  ?             Rockwell Alexandria, MD;  Location: [place];  Service: Vascular;   ?             Laterality: Left; ?No date: EYE SURGERY ?No date: FEMORAL ARTERY - POPLITEAL ARTERY  BYPASS GRAFT ?12/30/2020: ICD IMPLANT; N/A ?    Comment:  Procedure: ICD IMPLANT;  Surgeon: Evans Lance, MD;   ?             Location: [place] CV LAB;  Service: Cardiovascular;   ?             Laterality: N/A; ?04/14/2022: INTRAVASCULAR ULTRASOUND/IVUS; N/A ?    Comment:  Procedure: Intravascular Ultrasound/IVUS;  Surgeon:  ?             Patwardhan, Reynold Bowen, MD;  Location: [place] INVASIVE CV LAB;  ?             Service: Cardiovascular;  Laterality: N/A; ?No date: JOINT REPLACEMENT ?    Comment:  shoulder ?04/14/2022: LEFT HEART CATH; N/A ?    Comment:  Procedure: Left Heart Cath;  Surgeon: Vernell Leep ?             J, MD;  Location: [place] CV LAB;  Service:  ?             Cardiovascular;  Laterality: N/A; ?04/03/2015: LEFT HEART CATHETERIZATION WITH CORONARY ANGIOGRAM; N/A ?    Comment:  Procedure: LEFT HEART CATHETERIZATION WITH CORONARY  ?             ANGIOGRAM;  Surgeon: Troy Sine, MD;  Location: [place]  ?             CATH LAB;  Service: Cardiovascular;  Laterality: N/A; ?01/06/2013: LUMBAR LAMINECTOMY ?    Comment:  Procedure: MICRODISCECTOMY LUMBAR LAMINECTOMY;  Surgeon: ?             Marybelle Killings, MD;  Location: [place];  Service:  ?             Orthopedics;  Laterality: N/A;  L3-4 decompression ?02/12/2012: LUMBAR LAMINECTOMY/DECOMPRESSION MICRODISCECTOMY ?    Comment:  Procedure: LUMBAR LAMINECTOMY/DECOMPRESSION  ?             MICRODISCECTOMY;  Surgeon: Floyce Stakes, MD;   ?             Location: [facility];  Service: Neurosurgery;   ?             Laterality: N/A;  Lumbar four-five laminectomy ?04/24/2016: PATCH ANGIOPLASTY; Left ?    Comment:  Procedure: LEFT CAROTID ARTERY  PATCH ANGIOPLASTY;   ?             Surgeon: Mal Misty, MD;  Location: [place];  Service:  ?             Vascular;  Laterality: Left; ?01/30/2019: RIGHT HEART CATH AND CORONARY/GRAFT ANGIOGRAPHY; N/A ?    Comment:  Procedure: RIGHT HEART CATH AND CORONARY/GRAFT  ?             ANGIOGRAPHY;  Surgeon: Troy Sine, MD;   Location[facility] ?             INVASIVE CV LAB;  Service: Cardiovascular;  Laterality:  ?             N/A; ?03/17/2022: RIGHT/LEFT HEART CATH AND CORONARY ANGIOGRAPHY; N/A ?    Comment:  Procedure: RIGHT/LEFT HEART CATH AND CORONARY  ?             ANGIOGRAPHY;  Surgeon: Nigel Mormon, MD;   ?             Location: [place] CV LAB;  Service: Cardiovascular;   ?             Laterality: N/A; ?01/09/2014: STERIOD INJECTION; Right ?    Comment:  Procedure: STEROID INJECTION;  Surgeon: Lind Guest  ?             Ninfa Linden, MD;  Location: [place];  Service: Orthopedics;   ?             Laterality: Right; ?04/05/2015: TEE WITHOUT CARDIOVERSION; N/A ?    Comment:  Procedure: TRANSESOPHAGEAL ECHOCARDIOGRAM (TEE);   ?             Surgeon: [name], MD;  Location: [place];   ?             Service: Open Heart Surgery;  Laterality: N/A; ?01/09/2014: TOTAL HIP ARTHROPLASTY; Left ?    Comment:  DR Ninfa Linden ?01/09/2014: TOTAL HIP ARTHROPLASTY; Left ?    Comment:  Procedure: LEFT TOTAL HIP ARTHROPLASTY ANTERIOR APPROACH ?             and Steroid Injection Right hip;  Surgeon: Lind Guest  ?             Ninfa Linden, MD;  Location: [place];  Service: Orthopedics;   ?             Laterality: Left; ?08/15/2019: TOTAL HIP ARTHROPLASTY; Right ?08/15/2019: TOTAL HIP ARTHROPLASTY; Right ?    Comment:  Procedure: RIGHT TOTAL HIP ARTHROPLASTY ANTERIOR  ?             APPROACH;  Surgeon: Mcarthur Rossetti, MD;   ?             Location: [place];  Service: Orthopedics;  Laterality:  ?             Right;  ? ? ?The history is provided by the patient. No language interpreter was used.  ?Chest Pain ?Associated symptoms: shortness of breath   ?Associated symptoms: no abdominal pain, no cough, no dysphagia, no fever, no headache, no nausea, no palpitations and no vomiting   ? ?  ? ?Home Medications ?Prior to Admission medications   ?Medication Sig Start Date End Date Taking? Authorizing Provider  ?acetaminophen (TYLENOL) 325 MG tablet Take 2  tablets (650 mg total) by mouth every 6 (six) hours as needed for mild pain (or Fever >/= 101). ?Patient not taking: Reported  on 04/08/2022 11/19/21   Raiford Noble Latif, DO  ?allopurinol (ZYLOPRIM) 100

## 2022-04-23 NOTE — ED Triage Notes (Signed)
[facility] reports pt coming from Cardiologist office for chest pain and sob. States woke him up this am. However this has been going on for the past month but today it was the worst. EMS gave 1 Nitro and pt refused baby ASA due to doctors request. ?

## 2022-04-23 NOTE — ED Notes (Signed)
ED TO INPATIENT HANDOFF REPORT ? ?ED Nurse Name and Phone #: Baxter Flattery, RN ? ?S ?Name/Age/Gender ?Ryan [name] ?81 y.o. ?male ?Room/Bed: WA06/WA06 ? ?Code Status ?  Code Status: Prior ? ?Home/SNF/Other ?Home ?Patient oriented to: self, place, time, and situation ?Is this baseline? Yes  ? ?Triage Complete: Triage complete  ?Chief Complaint ?Acute respiratory failure with hypoxia ([place]) [J96.01] ?Acute respiratory failure ([place]) [J96.00] ? ?Triage Note ?[facility] reports pt coming from Cardiologist office for chest pain and sob. States woke him up this am. However this has been going on for the past month but today it was the worst. EMS gave 1 Nitro and pt refused baby ASA due to doctors request.  ? ?Allergies ?Allergies  ?Allergen Reactions  ? Jardiance [Empagliflozin] Nausea Only and Other (See Comments)  ?  Made patient very sick to the stomach.  ? Ezetimibe Other (See Comments)  ?  Myalgias   ? Ace Inhibitors Cough  ?  Other reaction(s): cough  ? Aspirin Other (See Comments)  ?  skin rash/easy bruising  ? Atorvastatin Other (See Comments)  ?  weakness, fatigue (severe)  ? Ezetimibe-Simvastatin Other (See Comments)  ?  myalgias  ? Rosuvastatin Other (See Comments)  ?  Myalgias/weakness  ? Codeine Nausea And Vomiting  ?    ?  ? Unithroid [Levothyroxine Sodium] Other (See Comments)  ?  Caused blurry vision per pt  ? ? ?Level of Care/Admitting Diagnosis ?ED Disposition   ? ? ED Disposition  ?Admit  ? Condition  ?--  ? Comment  ?Hospital Area: [facility] [[id number]] ? Level of Care: Telemetry Cardiac [103] ? May admit patient to [facility] or [facility] if equivalent level of care is available:: Yes ? Covid Evaluation: Confirmed COVID Negative ? Diagnosis: Acute respiratory failure ([place]) [518.81.ICD-9-CM] ? Admitting Physician: RAI, Alpine K [[id number]] ? Attending Physician: RAI, [place] [[id number]] ? Estimated length of stay: past midnight tomorrow ? Certification:: I certify this patient will need inpatient  services for at least 2 midnights ?  ?  ? ?  ? ? ?B ?Medical/Surgery History ?Past Medical History:  ?Diagnosis Date  ? Acquired thrombophilia ([place]) 11/01/2021  ? Arthritis   ? CAD (coronary artery disease)   ? Carotid artery occlusion   ? Cataract   ? 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 CHF (congestive heart failure) ([place])   ? Chronic back pain   ? COVID-19 10/31/2021  ? DVT (deep venous thrombosis) ([place])   ? Dysrhythmia   ? Enlarged prostate   ? takes Rapaflo daily  ? GERD (gastroesophageal reflux disease)   ? occasional  ? History of colon polyps   ? History of gout   ? has colchicine prn  ? History of kidney stones   ? Hyperlipidemia   ? takes Crestor daily  ? Hypertension   ? takes Amlodipine daily  ? Hypothyroidism   ? Myocardial infarction [facility])   ? Peripheral vascular disease ([place])   ? Prolonged QT interval 11/01/2021  ? Pulmonary emboli ([place]) 03/20/2015  ? elevated d-dimer, intermediate V/Q study, atypical chest pain and SOB. Start on Xarelto '20mg'$  BID for 3 month  ? Rapid atrial fibrillation ([place])   ? Renal insufficiency   ? Shortness of breath dyspnea   ? Urinary frequency   ? Urinary urgency   ? ?Past Surgical History:  ?Procedure Laterality Date  ? APPENDECTOMY    ? BACK SURGERY    ? 5 times  ? big toe  surgery    ? CARDIAC CATHETERIZATION    ? 2010    dr Acie Fredrickson  ? cataract surgery    ? left eye  ? CHOLECYSTECTOMY N/A 07/27/2016  ? Procedure: LAPAROSCOPIC CHOLECYSTECTOMY;  Surgeon: Mickeal Skinner, MD;  Location: [place];  Service: General;  Laterality: N/A;  ? COLONOSCOPY    ? CORONARY ARTERY BYPASS GRAFT N/A 04/05/2015  ? Procedure: CORONARY ARTERY BYPASS GRAFTING (CABG)X4 LIMA-LAD; SVG-DIAG1-DIAG2; SVG-PD;  Surgeon: [name], MD;  Location: [place];  Service: Open Heart Surgery;  Laterality: N/A;  ? CORONARY BALLOON ANGIOPLASTY N/A 04/14/2022  ? Procedure: CORONARY BALLOON ANGIOPLASTY;  Surgeon: Nigel Mormon, MD;  Location: [place] CV LAB;  Service: Cardiovascular;  Laterality: N/A;  ?  CORONARY/GRAFT ANGIOGRAPHY N/A 04/22/2018  ? Procedure: CORONARY/GRAFT ANGIOGRAPHY;  Surgeon: Martinique, Peter M, MD;  Location: [place] CV LAB;  Service: Cardiovascular;  Laterality: N/A;  ? CYSTOSCOPY    ? ENDARTERECTOMY Left 04/24/2016  ? Procedure: ENDARTERECTOMY LEFT CAROTID;  Surgeon: Mal Misty, MD;  Location: [place];  Service: Vascular;  Laterality: Left;  ? EYE SURGERY    ? FEMORAL ARTERY - POPLITEAL ARTERY BYPASS GRAFT    ? ICD IMPLANT N/A 12/30/2020  ? Procedure: ICD IMPLANT;  Surgeon: Evans Lance, MD;  Location: [place] CV LAB;  Service: Cardiovascular;  Laterality: N/A;  ? INTRAVASCULAR ULTRASOUND/IVUS N/A 04/14/2022  ? Procedure: Intravascular Ultrasound/IVUS;  Surgeon: Nigel Mormon, MD;  Location: [place] CV LAB;  Service: Cardiovascular;  Laterality: N/A;  ? JOINT REPLACEMENT    ? shoulder  ? LEFT HEART CATH N/A 04/14/2022  ? Procedure: Left Heart Cath;  Surgeon: Nigel Mormon, MD;  Location: [place] CV LAB;  Service: Cardiovascular;  Laterality: N/A;  ? LEFT HEART CATHETERIZATION WITH CORONARY ANGIOGRAM N/A 04/03/2015  ? Procedure: LEFT HEART CATHETERIZATION WITH CORONARY ANGIOGRAM;  Surgeon: Troy Sine, MD;  Location: [facility] CATH LAB;  Service: Cardiovascular;  Laterality: N/A;  ? LUMBAR LAMINECTOMY  01/06/2013  ? Procedure: MICRODISCECTOMY LUMBAR LAMINECTOMY;  Surgeon: Marybelle Killings, MD;  Location: [place];  Service: Orthopedics;  Laterality: N/A;  L3-4 decompression  ? LUMBAR LAMINECTOMY/DECOMPRESSION MICRODISCECTOMY  02/12/2012  ? Procedure: LUMBAR LAMINECTOMY/DECOMPRESSION MICRODISCECTOMY;  Surgeon: Floyce Stakes, MD;  Location: [place] NEURO ORS;  Service: Neurosurgery;  Laterality: N/A;  Lumbar four-five laminectomy  ? PATCH ANGIOPLASTY Left 04/24/2016  ? Procedure: LEFT CAROTID ARTERY PATCH ANGIOPLASTY;  Surgeon: Mal Misty, MD;  Location: [place];  Service: Vascular;  Laterality: Left;  ? RIGHT HEART CATH AND CORONARY/GRAFT ANGIOGRAPHY N/A 01/30/2019  ? Procedure: RIGHT  HEART CATH AND CORONARY/GRAFT ANGIOGRAPHY;  Surgeon: Troy Sine, MD;  Location: [place] CV LAB;  Service: Cardiovascular;  Laterality: N/A;  ? RIGHT/LEFT HEART CATH AND CORONARY ANGIOGRAPHY N/A 03/17/2022  ? Procedure: RIGHT/LEFT HEART CATH AND CORONARY ANGIOGRAPHY;  Surgeon: Nigel Mormon, MD;  Location: [place] CV LAB;  Service: Cardiovascular;  Laterality: N/A;  ? STERIOD INJECTION Right 01/09/2014  ? Procedure: STEROID INJECTION;  Surgeon: Mcarthur Rossetti, MD;  Location: [place];  Service: Orthopedics;  Laterality: Right;  ? TEE WITHOUT CARDIOVERSION N/A 04/05/2015  ? Procedure: TRANSESOPHAGEAL ECHOCARDIOGRAM (TEE);  Surgeon: [name], MD;  Location: [place];  Service: Open Heart Surgery;  Laterality: N/A;  ? TOTAL HIP ARTHROPLASTY Left 01/09/2014  ? DR Ninfa Linden  ? TOTAL HIP ARTHROPLASTY Left 01/09/2014  ? Procedure: LEFT TOTAL HIP ARTHROPLASTY ANTERIOR APPROACH and Steroid Injection Right hip;  Surgeon: Mcarthur Rossetti, MD;  Location: [place];  Service: Orthopedics;  Laterality: Left;  ? TOTAL HIP ARTHROPLASTY Right 08/15/2019  ? TOTAL HIP ARTHROPLASTY Right 08/15/2019  ? Procedure: RIGHT TOTAL HIP ARTHROPLASTY ANTERIOR APPROACH;  Surgeon: Mcarthur Rossetti, MD;  Location: [place];  Service: Orthopedics;  Laterality: Right;  ?  ? ?A ?IV Location/Drains/Wounds ?Patient Lines/Drains/Airways Status   ? ? Active Line/Drains/Airways   ? ? Name Placement date Placement time Site Days  ? Peripheral IV 04/23/22 18 G 1" Left Antecubital 04/23/22  --  Antecubital  less than 1  ? Wound / Incision (Open or Dehisced) 11/17/21 Foot Right;Posterior callous 11/17/21  2230  Foot  157  ? Wound / Incision (Open or Dehisced) 03/16/22 Skin tear Arm Anterior;Lower;Right 03/16/22  2300  Arm  38  ? ?  ?  ? ?  ? ? ?Intake/Output Last 24 hours ? ?Intake/Output Summary (Last 24 hours) at 04/23/2022 1816 ?Last data filed at 04/23/2022 1644 ?Gross per 24 hour  ?Intake 349.79 ml  ?Output --  ?Net 349.79 ml   ? ? ?Labs/Imaging ?Results for orders placed or performed during the hospital encounter of 04/23/22 (from the past 48 hour(s))  ?Basic metabolic panel     Status: Abnormal  ? Collection Time: 04/23/22 10:

## 2022-04-24 ENCOUNTER — Ambulatory Visit (HOSPITAL_COMMUNITY)
Admission: EM | Disposition: A | Discharge status: Home / Self Care | Payer: Self-pay | Source: Home / Self Care | Attending: Emergency Medicine

## 2022-04-24 ENCOUNTER — Encounter (HOSPITAL_COMMUNITY): Payer: Self-pay | Admitting: Cardiology

## 2022-04-24 ENCOUNTER — Other Ambulatory Visit (HOSPITAL_COMMUNITY): Payer: Medicare Other

## 2022-04-24 DIAGNOSIS — I25810 Atherosclerosis of coronary artery bypass graft(s) without angina pectoris: Secondary | ICD-10-CM | POA: Diagnosis not present

## 2022-04-24 DIAGNOSIS — I5033 Acute on chronic diastolic (congestive) heart failure: Secondary | ICD-10-CM | POA: Diagnosis not present

## 2022-04-24 DIAGNOSIS — I4821 Permanent atrial fibrillation: Secondary | ICD-10-CM | POA: Diagnosis not present

## 2022-04-24 DIAGNOSIS — J9601 Acute respiratory failure with hypoxia: Secondary | ICD-10-CM | POA: Diagnosis not present

## 2022-04-24 DIAGNOSIS — I2510 Atherosclerotic heart disease of native coronary artery without angina pectoris: Secondary | ICD-10-CM | POA: Diagnosis not present

## 2022-04-24 DIAGNOSIS — I739 Peripheral vascular disease, unspecified: Secondary | ICD-10-CM | POA: Diagnosis not present

## 2022-04-24 DIAGNOSIS — I25718 Atherosclerosis of autologous vein coronary artery bypass graft(s) with other forms of angina pectoris: Secondary | ICD-10-CM | POA: Diagnosis not present

## 2022-04-24 DIAGNOSIS — J810 Acute pulmonary edema: Secondary | ICD-10-CM | POA: Diagnosis not present

## 2022-04-24 DIAGNOSIS — I5023 Acute on chronic systolic (congestive) heart failure: Secondary | ICD-10-CM

## 2022-04-24 DIAGNOSIS — I25118 Atherosclerotic heart disease of native coronary artery with other forms of angina pectoris: Secondary | ICD-10-CM | POA: Diagnosis not present

## 2022-04-24 HISTORY — PX: LEFT HEART CATH AND CORONARY ANGIOGRAPHY: CATH118249

## 2022-04-24 LAB — BASIC METABOLIC PANEL
Anion gap: 8 (ref 5–15)
BUN: 9 mg/dL (ref 8–23)
CO2: 24 mmol/L (ref 22–32)
Calcium: 8.3 mg/dL — ABNORMAL LOW (ref 8.9–10.3)
Chloride: 102 mmol/L (ref 98–111)
Creatinine, Ser: 1.2 mg/dL (ref 0.61–1.24)
GFR, Estimated: >60 mL/min (ref >60)
Glucose, Bld: 88 mg/dL (ref 70–99)
Potassium: 3.7 mmol/L (ref 3.5–5.1)
Sodium: 134 mmol/L — ABNORMAL LOW (ref 135–145)

## 2022-04-24 LAB — CBC
HCT: 33.9 % — ABNORMAL LOW (ref 39.0–52.0)
Hemoglobin: 11.7 g/dL — ABNORMAL LOW (ref 13.0–17.0)
MCH: 33.1 pg (ref 26.0–34.0)
MCHC: 34.5 g/dL (ref 30.0–36.0)
MCV: 96 fL (ref 80.0–100.0)
Platelets: 354 10*3/uL (ref 150–400)
RBC: 3.53 MIL/uL — ABNORMAL LOW (ref 4.22–5.81)
RDW: 14.4 % (ref 11.5–15.5)
WBC: 8.6 10*3/uL (ref 4.0–10.5)
nRBC: 0 % (ref 0.0–0.2)

## 2022-04-24 LAB — STREP PNEUMONIAE URINARY ANTIGEN: Strep Pneumo Urinary Antigen: NEGATIVE

## 2022-04-24 SURGERY — LEFT HEART CATH AND CORONARY ANGIOGRAPHY
Anesthesia: LOCAL

## 2022-04-24 MED ORDER — ASPIRIN EC 81 MG PO TBEC: 81.0000 mg | DELAYED_RELEASE_TABLET | Freq: Every day | ORAL | 0 refills | Status: DC

## 2022-04-24 MED ORDER — SODIUM CHLORIDE 0.9% FLUSH: 3.0000 mL | INTRAVENOUS | Status: DC | PRN

## 2022-04-24 MED ORDER — SODIUM CHLORIDE 0.9 % IV SOLN: 250.0000 mL | INTRAVENOUS | Status: DC | PRN

## 2022-04-24 MED ORDER — HEPARIN SODIUM (PORCINE) 1000 UNIT/ML IJ SOLN
INTRAMUSCULAR | Status: AC
  Filled 2022-04-24: qty 10

## 2022-04-24 MED ORDER — MIDAZOLAM HCL 2 MG/2ML IJ SOLN
INTRAMUSCULAR | Status: DC | PRN
  Administered 2022-04-24: 2 mg via INTRAVENOUS

## 2022-04-24 MED ORDER — FENTANYL CITRATE (PF) 100 MCG/2ML IJ SOLN
INTRAMUSCULAR | Status: AC
  Filled 2022-04-24: qty 2

## 2022-04-24 MED ORDER — TORSEMIDE 20 MG PO TABS
20.0000 mg | ORAL_TABLET | Freq: Every day | ORAL | Status: DC
  Administered 2022-04-24: 20 mg via ORAL
  Filled 2022-04-24 (×2): qty 1

## 2022-04-24 MED ORDER — SODIUM CHLORIDE 0.9% FLUSH
3.0000 mL | Freq: Two times a day (BID) | INTRAVENOUS | Status: DC
  Administered 2022-04-24: 3 mL via INTRAVENOUS

## 2022-04-24 MED ORDER — NITROGLYCERIN 1 MG/10 ML FOR IR/CATH LAB
INTRA_ARTERIAL | Status: AC
  Filled 2022-04-24: qty 10

## 2022-04-24 MED ORDER — ISOSORB DINITRATE-HYDRALAZINE 20-37.5 MG PO TABS
1.0000 | ORAL_TABLET | Freq: Three times a day (TID) | ORAL | Status: DC
  Administered 2022-04-24 (×2): 1 via ORAL
  Filled 2022-04-24 (×3): qty 1

## 2022-04-24 MED ORDER — MIDAZOLAM HCL 2 MG/2ML IJ SOLN
INTRAMUSCULAR | Status: AC
  Filled 2022-04-24: qty 2

## 2022-04-24 MED ORDER — LIDOCAINE HCL (PF) 1 % IJ SOLN
INTRAMUSCULAR | Status: AC
  Filled 2022-04-24: qty 30

## 2022-04-24 MED ORDER — SODIUM CHLORIDE 0.9 % IV SOLN: INTRAVENOUS | Status: DC

## 2022-04-24 MED ORDER — LIDOCAINE HCL (PF) 1 % IJ SOLN
INTRAMUSCULAR | Status: DC | PRN
  Administered 2022-04-24: 5 mL

## 2022-04-24 MED ORDER — VERAPAMIL HCL 2.5 MG/ML IV SOLN
INTRAVENOUS | Status: DC | PRN
  Administered 2022-04-24: 10 mL via INTRA_ARTERIAL

## 2022-04-24 MED ORDER — FENTANYL CITRATE (PF) 100 MCG/2ML IJ SOLN
INTRAMUSCULAR | Status: DC | PRN
  Administered 2022-04-24: 25 ug via INTRAVENOUS

## 2022-04-24 MED ORDER — ISOSORB DINITRATE-HYDRALAZINE 20-37.5 MG PO TABS: 1.0000 | ORAL_TABLET | Freq: Three times a day (TID) | ORAL | 1 refills | Status: DC

## 2022-04-24 MED ORDER — ASPIRIN 81 MG PO CHEW
81.0000 mg | CHEWABLE_TABLET | ORAL | Status: AC
  Administered 2022-04-24: 81 mg via ORAL
  Filled 2022-04-24: qty 1

## 2022-04-24 MED ORDER — HEPARIN SODIUM (PORCINE) 1000 UNIT/ML IJ SOLN
INTRAMUSCULAR | Status: DC | PRN
  Administered 2022-04-24: 4000 [IU] via INTRAVENOUS

## 2022-04-24 MED ORDER — IOHEXOL 350 MG/ML SOLN
INTRAVENOUS | Status: DC | PRN
  Administered 2022-04-24: 30 mL

## 2022-04-24 MED ORDER — APIXABAN 2.5 MG PO TABS
2.5000 mg | ORAL_TABLET | Freq: Two times a day (BID) | ORAL | Status: DC
  Filled 2022-04-24: qty 1

## 2022-04-24 MED ORDER — VERAPAMIL HCL 2.5 MG/ML IV SOLN
INTRAVENOUS | Status: AC
  Filled 2022-04-24: qty 2

## 2022-04-24 MED ORDER — HEPARIN (PORCINE) IN NACL 1000-0.9 UT/500ML-% IV SOLN
INTRAVENOUS | Status: DC | PRN
  Administered 2022-04-24 (×2): 500 mL

## 2022-04-24 MED ORDER — TORSEMIDE 20 MG PO TABS: 20.0000 mg | ORAL_TABLET | Freq: Every day | ORAL | 1 refills | Status: DC

## 2022-04-24 MED ORDER — SODIUM CHLORIDE 0.9 % IV SOLN: INTRAVENOUS | Status: DC | PRN

## 2022-04-24 SURGICAL SUPPLY — 10 items
CATH OPTITORQUE TIG 4.0 5F (CATHETERS) ×1 IMPLANT
DEVICE RAD COMP TR BAND LRG (VASCULAR PRODUCTS) ×1 IMPLANT
GLIDESHEATH SLEND A-KIT 6F 22G (SHEATH) ×1 IMPLANT
GUIDEWIRE INQWIRE 1.5J.035X260 (WIRE) IMPLANT
INQWIRE 1.5J .035X260CM (WIRE) ×2
KIT ENCORE 26 ADVANTAGE (KITS) ×1 IMPLANT
KIT HEART LEFT (KITS) ×3 IMPLANT
PACK CARDIAC CATHETERIZATION (CUSTOM PROCEDURE TRAY) ×3 IMPLANT
TRANSDUCER W/STOPCOCK (MISCELLANEOUS) ×3 IMPLANT
TUBING CIL FLEX 10 FLL-RA (TUBING) ×2 IMPLANT

## 2022-04-24 NOTE — Progress Notes (Signed)
Nutrition Education Note ? ?RD consulted for nutrition education regarding new onset CHF. ? ?RD provided "Heart Failure Nutrition Therapy" and "Sodium Free Flavoring Tips" handouts from the Academy of Nutrition and Dietetics. Reviewed patient's dietary recall. Provided examples on ways to decrease sodium intake in diet. Discouraged intake of processed foods and use of salt shaker. Encouraged fresh fruits and vegetables as well as whole grain sources of carbohydrates to maximize fiber intake.  ? ?Reviewed with patient ways to increase calorie and protein intake within low sodium diet restrictions. He has had Boost pudding in the past. Encouraged intake of Boost pudding at home to maximize oral intake of protein and calories.  ? ?RD discussed why it is important for patient to adhere to diet recommendations, and emphasized the role of fluids, foods to avoid, and importance of weighing self daily. Teach back method used. ? ?Expect good compliance. ? ?Body mass index is 22.44 kg/m?Marland Kitchen Pt meets criteria for normal weight based on current BMI. ? ?Current diet order is heart healthy, patient is consuming approximately 75% of meals at this time. Labs and medications reviewed. Hopeful for discharge home today. No further nutrition interventions warranted at this time. RD contact information provided. If additional nutrition issues arise, please re-consult RD.  ? ?Ryan [name] RD, LDN, CNSC ?Please refer to Amion for contact information.                                                       ? ? ?

## 2022-04-24 NOTE — Telephone Encounter (Signed)
This was just a message informing provider, for documentation purposes.

## 2022-04-24 NOTE — TOC Initial Note (Signed)
Transition of Care (TOC) - Initial/Assessment Note  ?Patient is for dc home today, he has no needs. ? ?Patient Details  ?Name: Ryan [name] ?MRN: [id number] ?Date of Birth: 1941/04/06 ? ?Transition of Care (TOC) CM/SW Contact:    ?Zenon Mayo, RN ?Phone Number: ?04/24/2022, 6:09 PM ? ?Clinical Narrative:                 ? ? ?Expected Discharge Plan: Home/Self Care ?Barriers to Discharge: No Barriers Identified ? ? ?Patient Goals and CMS Choice ?Patient states their goals for this hospitalization and ongoing recovery are:: return home ?  ?Choice offered to / list presented to : NA ? ?Expected Discharge Plan and Services ?Expected Discharge Plan: Home/Self Care ?In-house Referral: NA ?Discharge Planning Services: CM Consult ?Post Acute Care Choice: NA ?Living arrangements for the past 2 months: [place] ?Expected Discharge Date: 04/24/22               ?  ?DME Agency: NA ?  ?  ?  ?HH Arranged: NA ?  ?  ?  ?  ? ?Prior Living Arrangements/Services ?Living arrangements for the past 2 months: [place] ?Lives with:: Spouse ?Patient language and need for interpreter reviewed:: Yes ?Do you feel safe going back to the place where you live?: Yes      ?Need for Family Participation in Patient Care: Yes (Comment) ?Care giver support system in place?: Yes (comment) ?  ?Criminal Activity/Legal Involvement Pertinent to Current Situation/Hospitalization: No - Comment as needed ? ?Activities of Daily Living ?Home Assistive Devices/Equipment: Dentures (specify type) (upper partial plate) ?ADL Screening (condition at time of admission) ?Patient's cognitive ability adequate to safely complete daily activities?: Yes ?Is the patient deaf or have difficulty hearing?: No ?Does the patient have difficulty seeing, even when wearing glasses/contacts?: No ?Does the patient have difficulty concentrating, remembering, or making decisions?: No ?Patient able to express need for assistance with ADLs?: Yes ?Does the  patient have difficulty dressing or bathing?: No ?Independently performs ADLs?: Yes (appropriate for developmental age) ?Does the patient have difficulty walking or climbing stairs?: No ?Weakness of Legs: Both ?Weakness of Arms/Hands: Both ? ?Permission Sought/Granted ?  ?  ?   ?   ?   ?   ? ?Emotional Assessment ?Appearance:: Appears stated age ?Attitude/Demeanor/Rapport: Engaged ?Affect (typically observed): Appropriate ?Orientation: : Oriented to Place, Oriented to  Time, Oriented to Situation, Oriented to Self ?Alcohol / Substance Use: Not Applicable ?Psych Involvement: No (comment) ? ?Admission diagnosis:  Acute respiratory failure ([place]) [J96.00] ?Elevated troponin [R77.8] ?Acute respiratory failure with hypoxia ([place]) [J96.01] ?Community acquired pneumonia, unspecified laterality [J18.9] ?Congestive heart failure, unspecified HF chronicity, unspecified heart failure type ([place]) [I50.9] ?Patient Active Problem List  ? Diagnosis Date Noted  ? Acute respiratory failure ([place]) 04/23/2022  ? Acute pulmonary edema (HCC)   ? CAD (coronary artery disease) 04/14/2022  ? Unstable angina ([place]) 03/16/2022  ? HFrEF (heart failure with reduced ejection fraction) ([place]) 12/31/2021  ? Acute heart failure with preserved ejection fraction ([place]) 12/26/2021  ? Complete tear of right rotator cuff 11/21/2021  ? VT (ventricular tachycardia) ([place]) 11/18/2021  ? Orthostatic hypotension 11/12/2021  ? Nausea 11/12/2021  ? Aortic stenosis 11/11/2021  ? Community acquired pneumonia 11/01/2021  ? Prolonged QT interval 11/01/2021  ? Acquired thrombophilia ([place]) 11/01/2021  ? Aortic atherosclerosis ([place]) 11/01/2021  ? Intractable nausea and vomiting 09/10/2021  ? Hypoxemia   ? Status post implantation of automatic cardioverter/defibrillator (AICD) 03/22/2021  ? Ventricular  tachycardia ([place]) 12/28/2020  ? Chronic heart failure with preserved ejection fraction ([place]) 12/28/2020  ? NSTEMI (non-ST elevated myocardial infarction) ([place])   ? Acute  respiratory failure with hypoxia ([place])   ? S/P total hip arthroplasty 08/16/2019  ? Unilateral primary osteoarthritis, right hip 07/18/2019  ? Persistent atrial fibrillation ([place]) 01/27/2019  ? Vertigo 01/27/2019  ? History of pneumonia 01/27/2019  ? Stage 3a chronic kidney disease (CKD) ([place]) 01/27/2019  ? Chest pain 02/07/2018  ? Elevated troponin 03/23/2017  ? Sigmoid diverticulitis 03/23/2017  ? Wrist pain, acute, right   ? Acute on chronic systolic heart failure ([place])   ? Subclinical hypothyroidism   ? Dyspnea 02/26/2017  ? Shoulder blade pain 02/26/2017  ? SOB (shortness of breath) 02/26/2017  ? Chronic right shoulder pain   ? Hyperlipidemia LDL goal <70 01/20/2017  ? Chronic cholecystitis 07/27/2016  ? PAD (peripheral artery disease) ([place]) 07/09/2015  ? S/P CABG x 4 04/05/2015  ? Atypical chest pain 03/20/2015  ? Carotid artery disease ([place]) 10/09/2014  ? PVD (peripheral vascular disease) ([place]) 10/09/2014  ? HTN (hypertension) 05/30/2014  ? Arthritis of left hip 01/09/2014  ? Status post THR (total hip replacement) 01/09/2014  ? Spinal stenosis, lumbar 01/06/2013  ?  Class: Diagnosis of  ? Coronary artery disease 01/05/2013  ? Atherosclerosis of native artery of extremity with intermittent claudication ([place]) 10/18/2012  ? ?PCP:  Ginger Organ., MD ?Pharmacy:   ?[id number] - [place], [address] [place] ?[place] ?[place] [address] ?Phone: [phone] Fax: [phone] ? ? ? ? ?Social Determinants of Health (SDOH) Interventions ?  ? ?Readmission Risk Interventions ? ?  04/24/2022  ?  6:06 PM 03/18/2022  ? 10:52 AM 11/19/2021  ?  2:42 PM  ?Readmission Risk Prevention Plan  ?Transportation Screening Complete Complete Complete  ?PCP or Specialist Appt within 3-5 Days   Complete  ?[place] or Home Care Consult   Complete  ?Social Work Consult for [place] Planning/Counseling   Complete  ?Palliative Care Screening   Not Applicable  ?Medication Review Press photographer)  Complete Complete Complete  ?PCP or Specialist appointment within 3-5 days of discharge Complete Complete   ?[place] or Home Care Consult Complete Complete   ?SW Recovery Care/Counseling Consult Complete Complete   ?Palliative Care Screening Not Applicable Not Applicable   ?[place] Not Applicable Not Applicable   ? ? ? ?

## 2022-04-24 NOTE — Progress Notes (Signed)
Mobility Specialist Progress Note ? ? 04/24/22 1443  ?Mobility  ?Activity Ambulated with assistance in room;Ambulated with assistance in hallway  ?Level of Assistance Standby assist, set-up cues, supervision of patient - no hands on  ?Assistive Device Front wheel walker  ?Distance Ambulated (ft) 32 ft  ?Activity Response Tolerated well  ?$Mobility charge 1 Mobility  ? ?Pre Mobility: 65 HR, 80/48 BP ?During Mobility: 76 HR, 87/64 BP ?Post Mobility: 62 HR, 99/59 BP ? ?Received pt in bed having no complaints and agreeable. Presenting w/ a low BP but pt having no symptoms. Reported pressures to RN and RN recommended only ambulating in room. No fault during ambulation. Left in chair w/ no complaints, chair alarm on and call bell in reach. ? ?Holland Falling ?Mobility Specialist ?Phone Number [phone] ? ?

## 2022-04-24 NOTE — Progress Notes (Signed)
04/24/2022 ?4:21 PM ?Discharge AVS meds taken today and those due this evening reviewed.  Follow-up appointments and when to call md reviewed.  D/C IV and TELE.  Questions and concerns addressed.   D/C home per orders.  ?Brion Aliment C ? ?

## 2022-04-24 NOTE — TOC Transition Note (Signed)
Transition of Care (TOC) - CM/SW Discharge Note ? ? ?Patient Details  ?Name: Ryan [name] ?MRN: [id number] ?Date of Birth: 26-Sep-1941 ? ?Transition of Care (TOC) CM/SW Contact:  ?Zenon Mayo, RN ?Phone Number: ?04/24/2022, 6:09 PM ? ? ?Clinical Narrative:    ?Patient is for dc home today, he has no needs. ?Wife transported patient home. ? ? ?Final next level of care: Home/Self Care ?Barriers to Discharge: No Barriers Identified ? ? ?Patient Goals and CMS Choice ?Patient states their goals for this hospitalization and ongoing recovery are:: return home ?  ?Choice offered to / list presented to : NA ? ?Discharge Placement ?  ?           ?  ?  ?  ?  ? ?Discharge Plan and Services ?In-house Referral: NA ?Discharge Planning Services: CM Consult ?Post Acute Care Choice: NA          ?  ?DME Agency: NA ?  ?  ?  ?HH Arranged: NA ?  ?  ?  ?  ? ?Social Determinants of Health (SDOH) Interventions ?  ? ? ?Readmission Risk Interventions ? ?  04/24/2022  ?  6:06 PM 03/18/2022  ? 10:52 AM 11/19/2021  ?  2:42 PM  ?Readmission Risk Prevention Plan  ?Transportation Screening Complete Complete Complete  ?PCP or Specialist Appt within 3-5 Days   Complete  ?[place] or Home Care Consult   Complete  ?Social Work Consult for [place] Planning/Counseling   Complete  ?Palliative Care Screening   Not Applicable  ?Medication Review Press photographer) Complete Complete Complete  ?PCP or Specialist appointment within 3-5 days of discharge Complete Complete   ?[place] or Home Care Consult Complete Complete   ?SW Recovery Care/Counseling Consult Complete Complete   ?Palliative Care Screening Not Applicable Not Applicable   ?[place] Not Applicable Not Applicable   ? ? ? ? ? ?

## 2022-04-24 NOTE — Consult Note (Signed)
? ?  [facility] CM Inpatient Consult ? ? ?04/24/2022 ? ?Ryan [name] ?[birth year] ?[id number] ? ?[facility] Organization [ACO] Patient: Medicare ACO REACH ? ?Primary Care Provider:  Ginger Organ., MD, [facility], is an [place] provider with a Chronic Care Management program ? ?Extreme high risk for unplanned readmission 5 EDs and 5 Admits within the past 6 months ? ?Patient is currently active with [facility] for chronic disease management services.  Patient has been engaged by a [place].  Our community based plan of care has focused on disease management and community resource support.   ? ?Met with the patient at the bedside and patient verbalized concerns for new medications. States, "I need to know what the changes are and what to take and what not to take. Spoke with inpatient [facility] RNCM and states patient may transition home late today or tomorrow.  Spoke of concerns for new medications for potential [facility] pharmacy needs.  ? ?Plan: Continue to follow for progress and post hospital follow up needs.  Patient could be assessed for CCM needs with Embedded pharmacist team member.  Inpatient Transition Of Care [TOC] team member to make aware that [place] Management following. Will follow up with [facility] RNCM regarding follow up needs. Gave patient an appointment reminder card. ? ?Of note, [facility] Care Management services does not replace or interfere with any services that are needed or arranged by inpatient [facility] care management team.  For additional questions or referrals please contact: ? ?[name], RN BSN CCM ?[facility] Liaison ? [phone] business mobile phone ?Toll free office [phone]  ?Fax number: [phone] ?[email or web address] ?[email or web address] ? ? ? ? ?

## 2022-04-24 NOTE — Care Management CC44 (Signed)
Condition Code 44 Documentation Completed ? ?Patient Details  ?Name: Ryan [name] ?MRN: [id number] ?Date of Birth: 16-Dec-1941 ? ? ?Condition Code 44 given:  Yes ?Patient signature on Condition Code 44 notice:  Yes ?Documentation of 2 MD's agreement:  Yes ?Code 44 added to claim:  Yes ? ? ? ?Zenon Mayo, RN ?04/24/2022, 3:11 PM ? ?

## 2022-04-24 NOTE — Progress Notes (Signed)
I have discontinued echocardiogram orders, LV gram performed during cardiac catheterization.  His EF is around 35 to 40%.  I started him on BiDil 1 p.o. 3 times daily, changed him over to torsemide from Lasix, I have referred nutritional services to discussed with him regarding heart failure diet.  Patient and his wife have been eating out and suspect his heart failure is related to poor cardiac output and also diet. ? ?If he remains stable, he can be discharged home this evening. ? ?Discontinue Plavix, continue Eliquis along with aspirin 81 mg daily.  Aspirin for 30 days only. ? ? ?Adrian Prows, MD, [facility] ?04/24/2022, 8:46 AM ?Office: [phone] ?Fax: [phone] ?Pager: [phone]  ?

## 2022-04-24 NOTE — Interval H&P Note (Signed)
History and Physical Interval Note: ? ?04/24/2022 ?7:46 AM ? ?Ryan [name]  has presented today for surgery, with the diagnosis of unstable angina.  The various methods of treatment have been discussed with the patient and family. After consideration of risks, benefits and other options for treatment, the patient has consented to  Procedure(s): ?LEFT HEART CATH AND CORONARY ANGIOGRAPHY (N/A) and possible angioplasty as a surgical intervention.  The patient's history has been reviewed, patient examined, no change in status, stable for surgery.  I have reviewed the patient's chart and labs.  Questions were answered to the patient's satisfaction.   ? ? ?Adrian Prows ? ? ?

## 2022-04-24 NOTE — Discharge Summary (Signed)
Physician Discharge Summary  ?Ryan [name] MHW:[id number] DOB: 1941/06/05 ? ?PCP: Ginger Organ., MD ? ?Admitted from: Home ?Discharged to: Home ? ?Admit date: 04/23/2022 ?Discharge date: 04/24/2022 ? ?Recommendations for Outpatient Follow-up:  ? ? Follow-up Information   ? ? Nigel Mormon, MD Follow up on 05/01/2022.   ?Specialties: Cardiology, Radiology ?Why: 05/01/2022 11:45 AM.  To be seen with repeat labs (CBC & BMP). ?Contact information: ?[address] ?[address] ?[place] [address] ?[phone] ? ? ?  ?  ? ? Ginger Organ., MD. Schedule an appointment as soon as possible for a visit.   ?Specialty: Internal Medicine ?Contact information: ?[address] ?[place] [address] ?[phone] ? ? ?  ?  ? ? Evans Lance, MD .   ?Specialty: Cardiology ?Contact information: ?[address] ?[address] ?[place] [address] ?[phone] ? ? ?  ?  ? ?  ?  ? ?  ? ? ? ?Home Health: None ?  ? ?Equipment/Devices: None ?  ? ?Discharge Condition: Improved and stable ?  Code Status: Full Code ?Diet recommendation:  ?Discharge Diet Orders (From admission, onward)  ? ?  Start     Ordered  ? 04/24/22 0000  Diet - low sodium heart healthy       ? 04/24/22 1430  ? ?  ?  ? ?  ?  ? ?Discharge Diagnoses:  ?Principal Problem: ?  Acute respiratory failure ([place]) ?Active Problems: ?  Stage 3a chronic kidney disease (CKD) ([place]) ?  Coronary artery disease ?  HTN (hypertension) ?  Hyperlipidemia LDL goal <70 ?  Acute on chronic systolic heart failure ([place]) ?  Chest pain ?  Persistent atrial fibrillation ([place]) ?  Acute respiratory failure with hypoxia ([place]) ?  Community acquired pneumonia ?  CAD (coronary artery disease) ? ? ?Brief Summary: ?81 year old married male, independent, medical history significant for CAD s/p CABG, chronic diastolic CHF, severe ischemic cardiomyopathy which recovered since CABG, ICD, moderate aortic stenosis, PAD with aortobifemoral bypass surgery on Plavix, moderate carotid  disease, stage III CKD, failed angioplasty attempt at PCI to circumflex coronary artery on 04/14/2022, persistent A-fib on Eliquis, strokes, initially presented to the [facility] with acute onset of worsening dyspnea, similar symptoms as to prior to angioplasty.  He reported that since his recent discharge, he was okay for about 2 to 3 days then started having severe dyspnea even with walking few steps in his house or in his driveway.  No chest pain but had to stop frequently.  He also started noticing worsening dyspnea even at rest and hence called EMS on the day of admission.  Noted leg edema and orthopnea as well. ? ?ED course ?In ED, temp 97.7, respiratory rate 17, pulse 57, BP 152/75, O2 sats 99% on room air ?Sodium 132, BUN 13, creatinine 1.3, BNP 1092.7 ?Troponin 28-26 ?  ?WBCs 9.4, hemoglobin 12.1 ?CT angiogram chest showed no evidence of PE, moderate size stable layering bilateral pleural effusions with associated compressive atelectasis, suggestive of pulmonary edema, cardiomegaly ?  ?Chest x-ray showed right lower lobe airspace disease concerning for pneumonia, mild hazy left lower lobe airspace disease reflecting atelectasis versus pneumonia ?  ?EKG showed rate 67, atrial flutter, QTc prolonged, 534.  No acute ST-T wave changes suggestive of ischemia ? ?He was admitted for evaluation and management of acute on chronic systolic CHF, flash pulmonary edema.  Cardiology was consulted and felt that his presentation was related to both progression of  coronary disease and incomplete revascularization.  He was transferred to [facility] for repeat coronary angiogram and possible angioplasty to the circumflex coronary artery.  Eliquis was held on night of admission.  He was treated with IV diuretics.  Postcardiac cath, determination made to treat him medically ? ?Assessment and plan: ? ?1.  Acute on chronic systolic CHF/flash pulmonary edema: TTE 12/26/2021: LVEF 50-55%, severe hypokinesis of the  left ventricular, basal anteroseptal wall.  Cardiology was consulted and indicated that his presentation was most probably related to poor progression of coronary disease and incomplete revascularization.  He was diuresed with IV Lasix.  He was transferred to [facility] and underwent cardiac cath on 5/5, detailed report as below but in essence, no change in coronary anatomy overall over the past 2 months he has had angiography.  Lasix was changed to torsemide, Imdur was changed to BiDil.  Patient clinically improved.  Cardiology cleared him for discharge and patient has an upcoming follow-up appointment with his primary cardiologist on 5/12.  Continue prior home dose of bisoprolol and Ranexa.  Also although patient had been started on IV antibiotics for suspected pneumonia, his clinical picture was felt to be due to CHF.  Moreover his procalcitonin was negative.  As discussed with cardiology, no antibiotics at discharge.  Urinary strep pneumo antigen negative.  Legionella antigen pending. ?2. CAD s/p CABG: Recent admission with partially successful PCI attempt.  Repeat cath as noted below.  As discussed with Dr. Einar Gip, DC Plavix at discharge and start aspirin 81 Mg daily x30 days only.  Patient has tolerated aspirin in the past.  Continue Zebeta, Ranexa and newly added BiDil. ?3.  Persistent A-fib: Controlled ventricular rate.  Continue Zebeta.  Continue Eliquis. ?4.  Prior history of CVA, PAD s/p aortofemoral bypass: As per cardiology recommendations, discontinued Plavix and initiated aspirin 81 Mg daily x30 days.  No claudication symptoms. ?5.  Hyperlipidemia: Continue Repatha ?6.  CKD stage IIIa: Baseline creatinine reportedly in the 1.2-1.5.  Creatinine at baseline.  Follow BMP closely as outpatient. ?7.  Left-sided chest pain: Present on admission.  Felt to be musculoskeletal.  Briefly treated with Lidoderm patch.  High-sensitivity troponins 26-28.  Cardiac work-up as above.  Chest pain appears to have  resolved.  No Lidoderm patch at DC. ?8.  Prolonged QTc: QTc by EKG 534 on admission.  Minimize QT prolonging medications as much as possible.  Discussed with patient regarding stopping Reglan and Zofran but he is insistent on continuing these.  Close outpatient follow-up with PCP and cardiology to see if this can be discontinued. ?9.  Gout: Continue allopurinol.  No acute flare. ?10.  BPH: Continue Proscar and Flomax. ?11.  Anemia: Outpatient follow-up. ? ? ? ?Consultations: ?Cardiology ? ?Procedures: ?Cardiac cath 5/5. ? ? ?Discharge Instructions ? ?Discharge Instructions   ? ? (HEART FAILURE PATIENTS) Call MD:  Anytime you have any of the following symptoms: 1) 3 pound weight gain in 24 hours or 5 pounds in 1 week 2) shortness of breath, with or without a dry hacking cough 3) swelling in the hands, feet or stomach 4) if you have to sleep on extra pillows at night in order to breathe.   Complete by: As directed ?  ? Call MD for:  difficulty breathing, headache or visual disturbances   Complete by: As directed ?  ? Call MD for:  extreme fatigue   Complete by: As directed ?  ? Call MD for:  persistant dizziness or light-headedness  Complete by: As directed ?  ? Call MD for:  persistant nausea and vomiting   Complete by: As directed ?  ? Call MD for:  severe uncontrolled pain   Complete by: As directed ?  ? Call MD for:  temperature >100.4   Complete by: As directed ?  ? Diet - low sodium heart healthy   Complete by: As directed ?  ? Increase activity slowly   Complete by: As directed ?  ? ?  ? ?  ?Medication List  ?  ? ?STOP taking these medications   ? ?clopidogrel 75 MG tablet ?Commonly known as: PLAVIX ?  ?furosemide 20 MG tablet ?Commonly known as: Lasix ?  ?isosorbide mononitrate 30 MG 24 hr tablet ?Commonly known as: IMDUR ?  ? ?  ? ?TAKE these medications   ? ?acetaminophen 325 MG tablet ?Commonly known as: TYLENOL ?Take 2 tablets (650 mg total) by mouth every 6 (six) hours as needed for mild pain (or Fever  >/= 101). ?  ?allopurinol 100 MG tablet ?Commonly known as: ZYLOPRIM ?Take 100 mg by mouth daily as needed (gout). ?  ?ALPRAZolam 0.5 MG tablet ?Commonly known as: Duanne Moron ?Take 0.5 mg by mouth 3 (three) ti

## 2022-04-24 NOTE — Progress Notes (Signed)
?  Transition of Care (TOC) Screening Note ? ? ?Patient Details  ?Name: Ryan [name] ?Date of Birth: 05/07/1941 ? ? ?Transition of Care (TOC) CM/SW Contact:    ?Dahlia Client, Romeo Rabon, RN ?Phone Number: ?04/24/2022, 10:02 AM ? ? ? ?Transition of Care Department [facility]) has reviewed patient and no TOC needs have been identified at this time. We will continue to monitor patient advancement through interdisciplinary progression rounds. If new patient transition needs arise, please place a TOC consult. ?  ?

## 2022-04-24 NOTE — Discharge Instructions (Signed)

## 2022-04-24 NOTE — Care Management Obs Status (Signed)
MEDICARE OBSERVATION STATUS NOTIFICATION ? ? ?Patient Details  ?Name: Ryan [name] ?MRN: [id number] ?Date of Birth: [birth year] ? ? ?Medicare Observation Status Notification Given:  Yes ? ? ? ?Zenon Mayo, RN ?04/24/2022, 3:11 PM ?

## 2022-04-27 MED FILL — Nitroglycerin IV Soln 100 MCG/ML in D5W: INTRA_ARTERIAL | Qty: 10 | Status: AC

## 2022-04-28 ENCOUNTER — Other Ambulatory Visit (HOSPITAL_COMMUNITY): Payer: Self-pay

## 2022-04-28 DIAGNOSIS — M546 Pain in thoracic spine: Secondary | ICD-10-CM | POA: Diagnosis not present

## 2022-04-28 DIAGNOSIS — I5023 Acute on chronic systolic (congestive) heart failure: Secondary | ICD-10-CM | POA: Diagnosis not present

## 2022-04-28 DIAGNOSIS — I130 Hypertensive heart and chronic kidney disease with heart failure and stage 1 through stage 4 chronic kidney disease, or unspecified chronic kidney disease: Secondary | ICD-10-CM | POA: Diagnosis not present

## 2022-04-28 DIAGNOSIS — I480 Paroxysmal atrial fibrillation: Secondary | ICD-10-CM | POA: Diagnosis not present

## 2022-04-28 DIAGNOSIS — R42 Dizziness and giddiness: Secondary | ICD-10-CM | POA: Diagnosis not present

## 2022-04-28 LAB — CULTURE, BLOOD (ROUTINE X 2)
Culture: NO GROWTH
Culture: NO GROWTH
Special Requests: ADEQUATE
Special Requests: ADEQUATE

## 2022-04-28 LAB — LEGIONELLA PNEUMOPHILA SEROGP 1 UR AG: L. pneumophila Serogp 1 Ur Ag: NEGATIVE

## 2022-04-29 ENCOUNTER — Telehealth: Payer: Self-pay | Admitting: Cardiology

## 2022-04-29 NOTE — Telephone Encounter (Signed)
Patient's wife says Medicare isn't going to cover medication prescribed by the hospital for patient and it will cost over $400. Patient wanting to see if there are any alternatives.  ?

## 2022-04-29 NOTE — Telephone Encounter (Signed)
Patient's wife says 11:45 AM is too early on 05/01/22 and they cannot make it at that time. Patient's wife says they do not want to wait until June to follow up post procedure, so they are ok to see CC 5/12 at 1:30 PM instead. ?

## 2022-04-30 NOTE — Telephone Encounter (Signed)
Lets address tomorrow 5/12. ? ?Thanks ?MJP ? ?

## 2022-04-30 NOTE — Telephone Encounter (Signed)
Called and spoke to pts wife, informed her that I will discuss pt assistance with her at the [facility] tomorrow.

## 2022-05-01 ENCOUNTER — Ambulatory Visit: Payer: Medicare Other | Admitting: Student

## 2022-05-01 ENCOUNTER — Encounter: Payer: Self-pay | Admitting: Student

## 2022-05-01 ENCOUNTER — Ambulatory Visit: Payer: Medicare Other | Admitting: Cardiology

## 2022-05-01 VITALS — BP 125/65 | HR 63 | Temp 98.0°F | Resp 17 | Ht 66.0 in | Wt 150.2 lb

## 2022-05-01 DIAGNOSIS — I4819 Other persistent atrial fibrillation: Secondary | ICD-10-CM | POA: Diagnosis not present

## 2022-05-01 DIAGNOSIS — I25118 Atherosclerotic heart disease of native coronary artery with other forms of angina pectoris: Secondary | ICD-10-CM

## 2022-05-01 DIAGNOSIS — I5042 Chronic combined systolic (congestive) and diastolic (congestive) heart failure: Secondary | ICD-10-CM | POA: Diagnosis not present

## 2022-05-01 DIAGNOSIS — I200 Unstable angina: Secondary | ICD-10-CM | POA: Diagnosis not present

## 2022-05-01 MED ORDER — LOSARTAN POTASSIUM 25 MG PO TABS: 12.5000 mg | ORAL_TABLET | Freq: Every day | ORAL | 3 refills | Status: DC

## 2022-05-01 NOTE — Progress Notes (Signed)
? ? ?Patient referred by Ginger Organ., MD for chest pain, shortness of breath ? ?Subjective:  ? ?Ryan [name], male    DOB: 20-Sep-1941, 81 y.o.   MRN: [id number] ? ? ?No chief complaint on file. ? ? ? ?HPI ? ?81 y.o. caucasian male with coronary artery disease status s/p CABG (patent LIMA-LAD, occluded SVG-ramus/OM Y graft), ischemic cardiomyopathy with recovered EF, s/p ICD, mod AS, persistent Afib, PAD s/o aortobifemoral bypass, mod carotid disease, h/o stroke, CKD III ? ?Patient has had recurrent hospitalizations with chest pain, acute on chronic HFpEF, worsening angina, and dyspnea on exertion.  Hospitalized in 3/23 with acute HFpEF.  At that time angiogram showed severe native vessel disease, patent LIMA-LAD.  He was therefore diuresed and started on Ranexa.  Patient was again admitted 04/14/2022 and opted to attempt coronary intervention.  He underwent partially successful PTCA to LCx.  During that hospitalization patient did have asymptomatic episodes of wide-complex rhythm likely idioventricular rhythm, therefore he was continued on amiodarone and bisoprolol. ? ?Most recently patient admitted 04/23/2022 - 04/24/2022 with recurrence of acute diastolic heart failure, most probably related to both progression of coronary disease and incomplete revascularization.  He underwent repeat left heart catheterization which revealed no change in coronary anatomy, LV gram did show LVEF of 35-40% with akinetic inferior wall and basal and mid inferior wall aneurysmal.  Patient was therefore started on BiDil and discharged home on amiodarone, Eliquis, aspirin, bisoprolol, Ranexa, Repatha, and torsemide as well. ? ?Patient now presents for follow up.  Patient reports chest pain has improved following discharge.  His primary concern today is dizziness which she describes as the room spinning.  He has also had multiple falls recently secondary to dizziness.  Denies syncope.  He is currently able to walk up and down his  driveway twice daily with only mild shortness of breath, which is also improved since discharge.  In regard to recent falls, patient reportedly has recently been referred to physical therapy by his PCP.  Courage patient to follow-up on this. ?  ? ?Current Outpatient Medications:  ?  acetaminophen (TYLENOL) 325 MG tablet, Take 2 tablets (650 mg total) by mouth every 6 (six) hours as needed for mild pain (or Fever >/= 101)., Disp: 20 tablet, Rfl: 0 ?  allopurinol (ZYLOPRIM) 100 MG tablet, Take 100 mg by mouth daily as needed (gout)., Disp: , Rfl:  ?  ALPRAZolam (XANAX) 0.5 MG tablet, Take 0.5 mg by mouth 3 (three) times daily as needed for anxiety or sleep., Disp: , Rfl:  ?  amiodarone (PACERONE) 200 MG tablet, Take 1 tablet (200 mg total) by mouth daily., Disp: 90 tablet, Rfl: 1 ?  apixaban (ELIQUIS) 2.5 MG TABS tablet, Take 2.5 mg by mouth 2 (two) times daily., Disp: , Rfl:  ?  aspirin EC 81 MG tablet, Take 1 tablet (81 mg total) by mouth daily. Swallow whole., Disp: 30 tablet, Rfl: 0 ?  bisoprolol (ZEBETA) 5 MG tablet, Take 0.5 tablets (2.5 mg total) by mouth daily., Disp: 1 tablet, Rfl: 0 ?  finasteride (PROSCAR) 5 MG tablet, Take 5 mg by mouth in the morning., Disp: , Rfl:  ?  isosorbide-hydrALAZINE (BIDIL) 20-37.5 MG tablet, Take 1 tablet by mouth 3 (three) times daily., Disp: 90 tablet, Rfl: 1 ?  meclizine (ANTIVERT) 12.5 MG tablet, TAKE ONE TABLET BY MOUTH THREE TIMES A DAY AS NEEDED FOR DIZZINESS (Patient taking differently: Take 12.5 mg by mouth 3 (three) times daily as needed for dizziness.),  Disp: 60 tablet, Rfl: 1 ?  methocarbamol (ROBAXIN) 500 MG tablet, Take 500 mg by mouth daily as needed for muscle spasms (back pain.)., Disp: , Rfl:  ?  metoCLOPramide (REGLAN) 5 MG tablet, Take 1 tablet (5 mg total) by mouth every 6 (six) hours as needed for nausea., Disp: 15 tablet, Rfl: 0 ?  nitroGLYCERIN (NITROSTAT) 0.4 MG SL tablet, Place 1 tablet (0.4 mg total) under the tongue every 5 (five) minutes as needed  for chest pain., Disp: 25 tablet, Rfl: 3 ?  ondansetron (ZOFRAN) 4 MG tablet, Take 4 mg by mouth every 8 (eight) hours as needed for nausea or vomiting., Disp: , Rfl:  ?  pantoprazole (PROTONIX) 40 MG tablet, Take 1 tablet (40 mg total) by mouth daily. (Patient not taking: Reported on 04/23/2022), Disp: 30 tablet, Rfl: 11 ?  polyethylene glycol (MIRALAX / GLYCOLAX) 17 g packet, Take 17 g by mouth 2 (two) times daily., Disp: , Rfl:  ?  ranolazine (RANEXA) 500 MG 12 hr tablet, Take 1 tablet (500 mg total) by mouth 2 (two) times daily., Disp: 60 tablet, Rfl: 2 ?  REPATHA SURECLICK 100 MG/ML SOAJ, INJECT 1 PEN INTO THE SKIN EVERY 14 DAYS. (Patient taking differently: Inject 140 mg into the skin every 14 (fourteen) days.), Disp: 2 mL, Rfl: 11 ?  tamsulosin (FLOMAX) 0.4 MG CAPS capsule, Take 0.4 mg by mouth in the morning., Disp: , Rfl:  ?  tobramycin (TOBREX) 0.3 % ophthalmic solution, Place 1 drop into the left eye 2 (two) times daily., Disp: , Rfl:  ?  torsemide (DEMADEX) 20 MG tablet, Take 1 tablet (20 mg total) by mouth daily., Disp: 30 tablet, Rfl: 1 ? ? ? ?Cardiovascular and other pertinent studies: ?EKG 05/01/2022: ?Atrial fibrillation at a rate of 69 bpm.  Diffuse nonspecific T wave abnormality.  Low voltage complexes.  Anteroseptal infarct old. ? ?Left heart catheterization and coronary angiography 04/24/2022: ?LV: 121/8, EDP 12 mmHg.  Ao 106/53, mean 71 mmHg.  There was no significant pressure gradient across the aortic valve. ?LV: The entire inferior wall is akinetic.  Basal and mid inferior wall is aneurysmal.  LVEF 35 to 40% due to aneurysmal formation. ?LM: Short, widely patent.  Mild calcifications evident. ?LAD: Large vessel however severely calcified and diffusely diseased throughout proximal and mid segment and is occluded in the distal segment.  LIMA to LAD previously widely patent and gives by 3 collaterals to occluded RCA.  Large D1 again severely diffusely diseased. ?RI: Large vessel, ostial 40 to 50%  stenosis.  Mild disease in the midsegment. ?CX: Large vessel however severely diffusely diseased all the way from the proximal to the distal segment.  The proximal circumflex is again severely calcified, ostium is widely patent at the previous angioplasty site, the mid segment appears to be a CTO with bridging collaterals again heavily calcified.  The AV groove circumflex is subtotally occluded with bridging collaterals.  A moderate-sized OM1 with mild disease. ?RCA: Occluded.  Distal RCA is collateralized by type III collaterals from the LAD and also from SP1. ?  ?Impression: Severe diffuse native vessel disease with occluded venous graft to circumflex, RCA and diagonal, LIMA to LAD is widely patent by previous angiography a week ago and is not cannulated. ?The circumflex lesion although high-grade, will not improve his functionality.  The whole native circumflex is diffusely diseased.  There is no change in coronary anatomy overall over the past 2 months he has had angiography.  There is still TIMI-3 flow.  I suspect CTO of the mid circumflex with bridging collaterals. ? ?On LV gram, the entire inferior wall is akinetic and basal and mid inferior wall is aneurysmal and although the anterolateral wall is hyperdynamic, the efficiency of the LV is markedly reduced in view of the aneurysm formation and suspect his etiology for recurrent heart failure is related to decreased cardiac output. ? ?Recommend therapy for heart failure with reduced LVEF.  I have started him on isosorbide dinitrate along with hydralazine as he is not able to take ARB or Entresto due to renal failure.  We will also transition him from furosemide to torsemide.  Home tomorrow if he remains stable.  30 mL contrast utilized. ? ?Recommendation: Recommend to resume Apixaban, at currently prescribed dose and frequency. Recommend concurrent antiplatelet therapy of Aspirin 81 mg daily. ? ?Left heart catheterization/coronary balloon angioplasty/20  04/2022: ?Attempted Lcx intervention ?Partially successful PTCA, 2.5 mm Scoreflex balloon at 10 atm ?Anatomy not conducive to stenting. ?Continue medical management. ?Recommendation: Plavix and eliquis without As

## 2022-05-12 DIAGNOSIS — I5042 Chronic combined systolic (congestive) and diastolic (congestive) heart failure: Secondary | ICD-10-CM | POA: Diagnosis not present

## 2022-05-13 LAB — BASIC METABOLIC PANEL
BUN/Creatinine Ratio: 18 (ref 10–24)
BUN: 21 mg/dL (ref 8–27)
CO2: 26 mmol/L (ref 20–29)
Calcium: 9 mg/dL (ref 8.6–10.2)
Chloride: 96 mmol/L (ref 96–106)
Creatinine, Ser: 1.19 mg/dL (ref 0.76–1.27)
Glucose: 85 mg/dL (ref 70–99)
Potassium: 5 mmol/L (ref 3.5–5.2)
Sodium: 136 mmol/L (ref 134–144)
eGFR: 62 mL/min/{1.73_m2} (ref >59)

## 2022-05-13 LAB — BRAIN NATRIURETIC PEPTIDE: BNP: 1067.1 pg/mL — ABNORMAL HIGH (ref 0.0–100.0)

## 2022-05-13 NOTE — Progress Notes (Signed)
Recommend increasing torsemide to 20 mg bid to avoid further increase in congestion, especially heading into long weekend.  Thanks MJP

## 2022-05-13 NOTE — Progress Notes (Signed)
Called patient, he wanted me to call his wife Letta Median at mobile number. I called wife, NA, LMAM.

## 2022-05-14 NOTE — Progress Notes (Signed)
PTs wife called back, pt aware.

## 2022-05-25 ENCOUNTER — Ambulatory Visit: Payer: Medicare Other | Admitting: Cardiology

## 2022-05-25 VITALS — Temp 98.0°F | Resp 16 | Ht 66.0 in | Wt 142.0 lb

## 2022-05-25 DIAGNOSIS — I5042 Chronic combined systolic (congestive) and diastolic (congestive) heart failure: Secondary | ICD-10-CM | POA: Diagnosis not present

## 2022-05-25 DIAGNOSIS — I10 Essential (primary) hypertension: Secondary | ICD-10-CM | POA: Diagnosis not present

## 2022-05-25 DIAGNOSIS — I25118 Atherosclerotic heart disease of native coronary artery with other forms of angina pectoris: Secondary | ICD-10-CM | POA: Diagnosis not present

## 2022-05-25 NOTE — Progress Notes (Unsigned)
Patient referred by Ginger Organ., MD for chest pain, shortness of breath  Subjective:   Ryan [name], male    DOB: [birth year], 81 y.o.   MRN: [id number]   No chief complaint on file.    HPI  81 year old caucasian male with coronary artery disease status s/p CABG (patent LIMA-LAD, occluded SVG-ramus/OM Y graft), ischemic cardiomyopathy with recovered EF, s/p ICD, mod AS, persistent Afib, PAD s/o aortobifemoral bypass, mod carotid disease, h/o stroke, CKD III   For the past few days, patient multitude of complaints.  He does report retrosternal chest pain, several times a day, that does seem to improve with sublingual nitroglycerin.  However, in addition, he also has severe constipation, has not passed bowel movement in 4 days, and has associated nausea.  He is also noticed left leg swelling.  In addition, he has noticed tinnitus in both his ears, for which she is supposed to see ear nose and throat.   Current Outpatient Medications:    acetaminophen (TYLENOL) 325 MG tablet, Take 2 tablets (650 mg total) by mouth every 6 (six) hours as needed for mild pain (or Fever >/= 101)., Disp: 20 tablet, Rfl: 0   albuterol (VENTOLIN HFA) 108 (90 Base) MCG/ACT inhaler, Inhale 1 puff into the lungs as needed., Disp: , Rfl:    allopurinol (ZYLOPRIM) 100 MG tablet, Take 100 mg by mouth daily as needed (gout)., Disp: , Rfl:    ALPRAZolam (XANAX) 0.5 MG tablet, Take 0.5 mg by mouth 3 (three) times daily as needed for anxiety or sleep., Disp: , Rfl:    amiodarone (PACERONE) 200 MG tablet, Take 1 tablet (200 mg total) by mouth daily., Disp: 90 tablet, Rfl: 1   apixaban (ELIQUIS) 2.5 MG TABS tablet, Take 2.5 mg by mouth 2 (two) times daily., Disp: , Rfl:    aspirin EC 81 MG tablet, Take 1 tablet (81 mg total) by mouth daily. Swallow whole., Disp: 30 tablet, Rfl: 0   bisoprolol (ZEBETA) 5 MG tablet, Take 0.5 tablets (2.5 mg total) by mouth daily., Disp: 1 tablet, Rfl: 0   finasteride (PROSCAR) 5  MG tablet, Take 5 mg by mouth in the morning., Disp: , Rfl:    losartan (COZAAR) 25 MG tablet, Take 0.5 tablets (12.5 mg total) by mouth daily., Disp: 15 tablet, Rfl: 3   meclizine (ANTIVERT) 12.5 MG tablet, TAKE ONE TABLET BY MOUTH THREE TIMES A DAY AS NEEDED FOR DIZZINESS (Patient taking differently: Take 12.5 mg by mouth 3 (three) times daily as needed for dizziness.), Disp: 60 tablet, Rfl: 1   methocarbamol (ROBAXIN) 500 MG tablet, Take 500 mg by mouth daily as needed for muscle spasms (back pain.)., Disp: , Rfl:    metoCLOPramide (REGLAN) 5 MG tablet, Take 1 tablet (5 mg total) by mouth every 6 (six) hours as needed for nausea., Disp: 15 tablet, Rfl: 0   nitroGLYCERIN (NITROSTAT) 0.4 MG SL tablet, Place 1 tablet (0.4 mg total) under the tongue every 5 (five) minutes as needed for chest pain., Disp: 25 tablet, Rfl: 3   ondansetron (ZOFRAN) 4 MG tablet, Take 4 mg by mouth every 8 (eight) hours as needed for nausea or vomiting., Disp: , Rfl:    pantoprazole (PROTONIX) 40 MG tablet, Take 1 tablet (40 mg total) by mouth daily., Disp: 30 tablet, Rfl: 11   polyethylene glycol powder (GLYCOLAX/MIRALAX) 17 GM/SCOOP powder, Take 17 g by mouth in the morning and at bedtime., Disp: , Rfl:    ranolazine (  RANEXA) 500 MG 12 hr tablet, Take 1 tablet (500 mg total) by mouth 2 (two) times daily., Disp: 60 tablet, Rfl: 2   REPATHA SURECLICK 790 MG/ML SOAJ, INJECT 1 PEN INTO THE SKIN EVERY 14 DAYS. (Patient taking differently: Inject 140 mg into the skin every 14 (fourteen) days.), Disp: 2 mL, Rfl: 11   tamsulosin (FLOMAX) 0.4 MG CAPS capsule, Take 0.4 mg by mouth in the morning., Disp: , Rfl:    tobramycin (TOBREX) 0.3 % ophthalmic solution, Place 1 drop into the left eye 2 (two) times daily., Disp: , Rfl:    torsemide (DEMADEX) 20 MG tablet, Take 1 tablet (20 mg total) by mouth daily., Disp: 30 tablet, Rfl: 1  Cardiovascular and other pertinent studies:  EKG 03/16/2022: Atrial fibrillation 55 bpm  Diffuse  nonspecific T-abnormality  Low voltage Old anteroseptal infarct   Echocardiogram 12/27/2021:  1. Left ventricular ejection fraction, by estimation, is 50 to 55%. The  left ventricle has low normal function. The left ventricle demonstrates  regional wall motion abnormalities (see scoring diagram/findings for  description). Diastolic function not  assessed due to Afib. There is severe hypokinesis of the left ventricular,  basal anteroseptal wall.   2. Right ventricular systolic function is mildly reduced. The right  ventricular size is normal. There is mildly elevated pulmonary artery  systolic pressure.   3. Left atrial size was severely dilated.   4. Right atrial size was moderately dilated.   5. The mitral valve is grossly normal. Mild mitral valve regurgitation.   6. Mild tricuspid regurgitation. peak RA-RV gradient 33 mmHg. . Tricuspid  valve regurgitation is mild.   7. Vmax 2.3 m/sec, mean PG 13 mmHg. AVA by continuity equation is 0.9  cm2, with dimensionless index of 0.24. Small LV cavity with LV SV index of  20 cc/m2. Likely paradoxically low flow low gradient severe aortic  stenosis. . The aortic valve is  tricuspid. There is moderate calcification of the aortic valve.   8. Overall, LVEF assessment remains difficult. No significant change  compared to previous study in 08/2021.   Coronary and bypass graft angiography 01/2019: Severe native coronary artery disease with diffuse proximal to mid LAD stenosis with diffusely diseased native LAD proximally and mid with competitive filling from the LIMA graft; 90% proximal stenosis in the ramus immediate vessel, diffuse stenosis of 60 to 70% in the proximal circumflex with diffuse 80% distal stenosis and total occlusion of the AV groove after marginal vessel with total occlusion of the proximal native RCA.  There is extensive collateralization to the distal RCA both via the circumflex vessel as well as via the LIMA to LAD and distal LAD.    Patent LIMA graft supplying the mid LAD   Previously documented old occlusion of the previous Y graft which had supplied the ramus and marginal vessel in the vein graft which had supplied the PDA.   Low right heart pressures with mean PA pressure at 19 mmHg.   RECOMMENDATION: Increase medical therapy.  The patient has only been on low-dose anti-ischemic medical regimen consisting of bisoprolol 2.5 mg as well as isosorbide 60 mg.  Recommend initiation of amlodipine 5 mg.  Also recommend consideration of Ranexa 500 mg twice a day.  The patient is on rosuvastatin.  However if the patient is to be on atorvastatin, the maximum recommended dose of atorvastatin is 40 mg if the patient is on ranolazine.   Echocardiogram 08/28/2021: 1. Left ventricular ejection fraction, by estimation, is 50 to 55%.  The left ventricle has low normal function. The left ventricle demonstrates regional wall motion abnormalities. Basal anterior/anteroseptal hypokinesis. There is severe asymmetric left ventricular hypertrophy of the basal-septal segment. Left ventricular diastolic parameters are indeterminate. 2. Right ventricular systolic function is moderately reduced. The right ventricular size is normal. There is normal pulmonary artery systolic pressure. 3. The mitral valve is normal in structure. Trivial mitral valve regurgitation. No evidence of mitral stenosis. 4. The inferior vena cava is normal in size with greater than 50% respiratory variability, suggesting right atrial pressure of 3 mmHg. 5. The aortic valve is calcified. There is moderate calcification of the aortic valve. Aortic valve regurgitation is not visualized. Moderate aortic valve stenosis. Mean gradient only 86mHg, but AVA 1.0 cm^2 and DI 0.35, suspect paradoxical low flow low gradient moderate AS given low SV index (21cc/m^2) 6. Small mobile echodensity adjacent to RV pacemaker lead (seen on image 14), could represent fibrinous strand or small adherent  thrombus; if clinical concern for infection would check blood cultures   Recent labs: 01/07/2022: Glucose 102, BUN/Cr 23/1.41. EGFR 50. Na/K 137/4.9.  BNP 599  12/31/2021: BNP 592  12/28/2021: Glucose 91, BUN/Cr 21/1.54. EGFR 45. Na/K 138/3.7.  H/H 13/40. MCV 97. Platelets 258   Latest Reference Range & Units 12/23/21 14:57 12/25/21 15:11 12/25/21 20:21 12/26/21 12:03 12/26/21 17:54  B Natriuretic Peptide 0.0 - 100.0 pg/mL 1,115.7 (H) 864.7 (H)     Troponin I (High Sensitivity) <18 ng/L 42 (H) 41 (H) 41 (H) 41 (H) 62 (H)  (H): Data is abnormally high  12/23/2021: Glucose 106, BUN/Cr 17/1.48. EGFR 48. Na/K 138/4.1. Rest of the CMP normal H/H 14/46. MCV 100. Platelets 279  08/2021: HbA1C 6.0% Chol 99, TG 72, HDL 39, LDL 45 TSH 5.0 normal   Latest Reference Range & Units 12/15/21 01:01 12/15/21 03:14 12/23/21 13:04 12/23/21 14:57  B Natriuretic Peptide 0.0 - 100.0 pg/mL 1,878.1 (H)   1,115.7 (H)  Troponin I (High Sensitivity) <18 ng/L 57 (H) 51 (H) 45 (H) 42 (H)  (H): Data is abnormally high    Review of Systems  HENT:  Positive for tinnitus.   Cardiovascular:  Positive for chest pain. Negative for dyspnea on exertion, leg swelling, palpitations and syncope.  Respiratory:  Negative for shortness of breath.   Gastrointestinal:  Positive for constipation and nausea.  Genitourinary:        Self catheterization at home due to urinary retention  Neurological:  Positive for dizziness.        There were no vitals filed for this visit.    There is no height or weight on file to calculate BMI. There were no vitals filed for this visit.    Objective:   Physical Exam Vitals and nursing note reviewed.  Constitutional:      General: He is not in acute distress. Neck:     Vascular: JVD present.  Cardiovascular:     Rate and Rhythm: Normal rate. Rhythm irregular.     Pulses:          Dorsalis pedis pulses are 0 on the right side and 0 on the left side.       Posterior  tibial pulses are 0 on the right side and 1+ on the left side.     Heart sounds: Murmur heard.  Harsh midsystolic murmur is present with a grade of 2/6 at the upper right sternal border radiating to the neck.  Pulmonary:     Effort: Pulmonary effort is normal.  Breath sounds: Normal breath sounds. No wheezing or rales.  Musculoskeletal:     Right lower leg: No edema.     Left lower leg: Edema (1+) present.       Assessment & Recommendations:   81 year old caucasian male with coronary artery disease status s/p CABG (patent LIMA-LAD, occluded SVG-ramus/OM Y graft), ischemic cardiomyopathy with recovered EF, s/p ICD, mod AS, persistent Afib, PAD s/o aortobifemoral bypass, mod carotid disease, h/o stroke, CKD III, now with chest pain  Chest pain: Possible unstable angina.  Presentation is similar to his presentation few months ago, associated with acute on chronic heart failure decompensation. Patient is unwilling to go to emergency room.  EKG is not acutely ischemic.  I will admit him electively with potential plans for coronary and bypass graft angiography through left radial access during this hospitalization.  Acute on chronic HFpEF: Likely combination of ischemic cardiomyopathy, also component of mild RV systolic dysfunction. He possibly also has paradoxical low flow low gradient aortic stenosis, which may also be contributing. He has done with medical therapy with torsemide as preferred diuretic. Continue spironolactone 12.5 mg daily, bisoprolol 2.5 mg daily. We will start on IV Lasix during hospitalization.  Constipation, nausea, tinnitus: Unlikely to be related to his cardiac manifestation.  That said, he is less likely to undergo any work-up and management for these, without addressing his cardiac issues.  Therefore, my plan for hospitalization would be to address acute cardiac issues, and then defer rest of the management to his primary care provider and other specialties on  outpatient basis.  Persistent A. fib/atrial flutter: Longstanding.  Rate controlled. High CHA2DS2VAsc score Continue Eliquis 5 mg daily.   We will hold this on hospitalization, prior to cardiac catheterization.   H/o NSVT: Continue amiodarone 200 mg daily.   CKD stage III: Stable  PAD: S/p aortobifem bypass.  No critical limb ischemia.   Elective hospitalization was recommended.  There is no bed available today.  We will admit the patient today or tomorrow, subject to availability.    Nigel Mormon, MD Pager: [phone] Office: [phone]

## 2022-05-26 ENCOUNTER — Encounter: Payer: Self-pay | Admitting: Cardiology

## 2022-06-03 DIAGNOSIS — E039 Hypothyroidism, unspecified: Secondary | ICD-10-CM | POA: Diagnosis not present

## 2022-06-03 DIAGNOSIS — R7301 Impaired fasting glucose: Secondary | ICD-10-CM | POA: Diagnosis not present

## 2022-06-03 DIAGNOSIS — I10 Essential (primary) hypertension: Secondary | ICD-10-CM | POA: Diagnosis not present

## 2022-06-03 DIAGNOSIS — M109 Gout, unspecified: Secondary | ICD-10-CM | POA: Diagnosis not present

## 2022-06-03 DIAGNOSIS — E785 Hyperlipidemia, unspecified: Secondary | ICD-10-CM | POA: Diagnosis not present

## 2022-06-03 DIAGNOSIS — Z125 Encounter for screening for malignant neoplasm of prostate: Secondary | ICD-10-CM | POA: Diagnosis not present

## 2022-06-03 DIAGNOSIS — F419 Anxiety disorder, unspecified: Secondary | ICD-10-CM | POA: Diagnosis not present

## 2022-06-10 DIAGNOSIS — N1831 Chronic kidney disease, stage 3a: Secondary | ICD-10-CM | POA: Diagnosis not present

## 2022-06-10 DIAGNOSIS — I5020 Unspecified systolic (congestive) heart failure: Secondary | ICD-10-CM | POA: Diagnosis not present

## 2022-06-10 DIAGNOSIS — Z1331 Encounter for screening for depression: Secondary | ICD-10-CM | POA: Diagnosis not present

## 2022-06-10 DIAGNOSIS — I6529 Occlusion and stenosis of unspecified carotid artery: Secondary | ICD-10-CM | POA: Diagnosis not present

## 2022-06-10 DIAGNOSIS — R82998 Other abnormal findings in urine: Secondary | ICD-10-CM | POA: Diagnosis not present

## 2022-06-10 DIAGNOSIS — R42 Dizziness and giddiness: Secondary | ICD-10-CM | POA: Diagnosis not present

## 2022-06-10 DIAGNOSIS — D6869 Other thrombophilia: Secondary | ICD-10-CM | POA: Diagnosis not present

## 2022-06-10 DIAGNOSIS — I480 Paroxysmal atrial fibrillation: Secondary | ICD-10-CM | POA: Diagnosis not present

## 2022-06-10 DIAGNOSIS — I130 Hypertensive heart and chronic kidney disease with heart failure and stage 1 through stage 4 chronic kidney disease, or unspecified chronic kidney disease: Secondary | ICD-10-CM | POA: Diagnosis not present

## 2022-06-10 DIAGNOSIS — E785 Hyperlipidemia, unspecified: Secondary | ICD-10-CM | POA: Diagnosis not present

## 2022-06-10 DIAGNOSIS — Z7901 Long term (current) use of anticoagulants: Secondary | ICD-10-CM | POA: Diagnosis not present

## 2022-06-10 DIAGNOSIS — Z0000 Encounter for general adult medical examination without abnormal findings: Secondary | ICD-10-CM | POA: Diagnosis not present

## 2022-06-10 DIAGNOSIS — I700 Atherosclerosis of aorta: Secondary | ICD-10-CM | POA: Diagnosis not present

## 2022-06-10 DIAGNOSIS — Z1339 Encounter for screening examination for other mental health and behavioral disorders: Secondary | ICD-10-CM | POA: Diagnosis not present

## 2022-06-10 DIAGNOSIS — G720 Drug-induced myopathy: Secondary | ICD-10-CM | POA: Diagnosis not present

## 2022-06-10 DIAGNOSIS — I25810 Atherosclerosis of coronary artery bypass graft(s) without angina pectoris: Secondary | ICD-10-CM | POA: Diagnosis not present

## 2022-06-15 ENCOUNTER — Other Ambulatory Visit: Payer: Self-pay | Admitting: Cardiology

## 2022-06-15 ENCOUNTER — Encounter: Payer: Self-pay | Admitting: Cardiology

## 2022-06-15 ENCOUNTER — Ambulatory Visit: Payer: Medicare Other | Admitting: Cardiology

## 2022-06-15 VITALS — BP 132/64 | HR 67 | Temp 98.0°F | Resp 16 | Ht 66.0 in | Wt 139.0 lb

## 2022-06-15 DIAGNOSIS — I5042 Chronic combined systolic (congestive) and diastolic (congestive) heart failure: Secondary | ICD-10-CM

## 2022-06-15 DIAGNOSIS — I25118 Atherosclerotic heart disease of native coronary artery with other forms of angina pectoris: Secondary | ICD-10-CM | POA: Diagnosis not present

## 2022-06-15 DIAGNOSIS — I200 Unstable angina: Secondary | ICD-10-CM | POA: Diagnosis not present

## 2022-06-15 DIAGNOSIS — I4819 Other persistent atrial fibrillation: Secondary | ICD-10-CM

## 2022-06-15 DIAGNOSIS — R42 Dizziness and giddiness: Secondary | ICD-10-CM

## 2022-06-15 MED ORDER — DAPAGLIFLOZIN PROPANEDIOL 10 MG PO TABS: 10.0000 mg | ORAL_TABLET | Freq: Every day | ORAL | 2 refills | Status: DC

## 2022-06-16 ENCOUNTER — Other Ambulatory Visit: Payer: Self-pay | Admitting: Cardiology

## 2022-06-18 ENCOUNTER — Other Ambulatory Visit: Payer: Medicare Other | Admitting: *Deleted

## 2022-06-18 NOTE — Patient Outreach (Signed)
[facility]) Care Management  06/18/2022  WYLEE DORANTES [birth year] [phone]   Telephone Assessment-unsuccessful  RN attempted outreach call however unsuccessful. RN able to leave a HIPAA approved voice message requesting a call back.   Will attempt another outreach call over the next week for pending services.   [name], RN Care Management Coordinator [place] Office [phone]

## 2022-06-19 ENCOUNTER — Ambulatory Visit: Payer: Medicare Other | Admitting: *Deleted

## 2022-06-19 ENCOUNTER — Other Ambulatory Visit: Payer: Self-pay | Admitting: Cardiology

## 2022-06-19 DIAGNOSIS — E785 Hyperlipidemia, unspecified: Secondary | ICD-10-CM | POA: Diagnosis not present

## 2022-06-19 DIAGNOSIS — I5022 Chronic systolic (congestive) heart failure: Secondary | ICD-10-CM | POA: Diagnosis not present

## 2022-06-19 DIAGNOSIS — I130 Hypertensive heart and chronic kidney disease with heart failure and stage 1 through stage 4 chronic kidney disease, or unspecified chronic kidney disease: Secondary | ICD-10-CM | POA: Diagnosis not present

## 2022-06-19 DIAGNOSIS — N1831 Chronic kidney disease, stage 3a: Secondary | ICD-10-CM | POA: Diagnosis not present

## 2022-06-25 DIAGNOSIS — M109 Gout, unspecified: Secondary | ICD-10-CM | POA: Diagnosis not present

## 2022-06-25 DIAGNOSIS — Z96612 Presence of left artificial shoulder joint: Secondary | ICD-10-CM | POA: Diagnosis not present

## 2022-06-25 DIAGNOSIS — I252 Old myocardial infarction: Secondary | ICD-10-CM | POA: Diagnosis not present

## 2022-06-25 DIAGNOSIS — F419 Anxiety disorder, unspecified: Secondary | ICD-10-CM | POA: Diagnosis not present

## 2022-06-25 DIAGNOSIS — Z7901 Long term (current) use of anticoagulants: Secondary | ICD-10-CM | POA: Diagnosis not present

## 2022-06-25 DIAGNOSIS — Z87442 Personal history of urinary calculi: Secondary | ICD-10-CM | POA: Diagnosis not present

## 2022-06-25 DIAGNOSIS — I472 Ventricular tachycardia, unspecified: Secondary | ICD-10-CM | POA: Diagnosis not present

## 2022-06-25 DIAGNOSIS — I480 Paroxysmal atrial fibrillation: Secondary | ICD-10-CM | POA: Diagnosis not present

## 2022-06-25 DIAGNOSIS — N183 Chronic kidney disease, stage 3 unspecified: Secondary | ICD-10-CM | POA: Diagnosis not present

## 2022-06-25 DIAGNOSIS — I739 Peripheral vascular disease, unspecified: Secondary | ICD-10-CM | POA: Diagnosis not present

## 2022-06-25 DIAGNOSIS — I700 Atherosclerosis of aorta: Secondary | ICD-10-CM | POA: Diagnosis not present

## 2022-06-25 DIAGNOSIS — M4800 Spinal stenosis, site unspecified: Secondary | ICD-10-CM | POA: Diagnosis not present

## 2022-06-25 DIAGNOSIS — I130 Hypertensive heart and chronic kidney disease with heart failure and stage 1 through stage 4 chronic kidney disease, or unspecified chronic kidney disease: Secondary | ICD-10-CM | POA: Diagnosis not present

## 2022-06-25 DIAGNOSIS — Z96642 Presence of left artificial hip joint: Secondary | ICD-10-CM | POA: Diagnosis not present

## 2022-06-25 DIAGNOSIS — I5020 Unspecified systolic (congestive) heart failure: Secondary | ICD-10-CM | POA: Diagnosis not present

## 2022-06-25 DIAGNOSIS — Z9181 History of falling: Secondary | ICD-10-CM | POA: Diagnosis not present

## 2022-06-25 DIAGNOSIS — Z86010 Personal history of colonic polyps: Secondary | ICD-10-CM | POA: Diagnosis not present

## 2022-06-25 DIAGNOSIS — Z87891 Personal history of nicotine dependence: Secondary | ICD-10-CM | POA: Diagnosis not present

## 2022-06-25 DIAGNOSIS — I6529 Occlusion and stenosis of unspecified carotid artery: Secondary | ICD-10-CM | POA: Diagnosis not present

## 2022-06-25 DIAGNOSIS — I25810 Atherosclerosis of coronary artery bypass graft(s) without angina pectoris: Secondary | ICD-10-CM | POA: Diagnosis not present

## 2022-06-25 DIAGNOSIS — E039 Hypothyroidism, unspecified: Secondary | ICD-10-CM | POA: Diagnosis not present

## 2022-06-25 DIAGNOSIS — Z951 Presence of aortocoronary bypass graft: Secondary | ICD-10-CM | POA: Diagnosis not present

## 2022-06-25 DIAGNOSIS — G720 Drug-induced myopathy: Secondary | ICD-10-CM | POA: Diagnosis not present

## 2022-06-25 DIAGNOSIS — N400 Enlarged prostate without lower urinary tract symptoms: Secondary | ICD-10-CM | POA: Diagnosis not present

## 2022-06-25 DIAGNOSIS — E785 Hyperlipidemia, unspecified: Secondary | ICD-10-CM | POA: Diagnosis not present

## 2022-06-28 ENCOUNTER — Other Ambulatory Visit: Payer: Self-pay | Admitting: Cardiology

## 2022-07-01 ENCOUNTER — Ambulatory Visit (INDEPENDENT_AMBULATORY_CARE_PROVIDER_SITE_OTHER): Payer: Medicare Other

## 2022-07-01 DIAGNOSIS — I472 Ventricular tachycardia, unspecified: Secondary | ICD-10-CM | POA: Diagnosis not present

## 2022-07-01 LAB — CUP PACEART REMOTE DEVICE CHECK
Battery Remaining Longevity: 174 mo
Battery Remaining Percentage: 100 %
Brady Statistic RV Percent Paced: 3 %
Date Time Interrogation Session: 20230712044000
HighPow Impedance: 63 Ohm
Implantable Lead Implant Date: 20220110
Implantable Lead Location: 753860
Implantable Lead Model: 137
Implantable Lead Serial Number: 301176
Implantable Pulse Generator Implant Date: 20220110
Lead Channel Impedance Value: 523 Ohm
Lead Channel Setting Pacing Amplitude: 2.5 V
Lead Channel Setting Pacing Pulse Width: 0.4 ms
Lead Channel Setting Sensing Sensitivity: 0.5 mV
Pulse Gen Serial Number: 211464

## 2022-07-02 DIAGNOSIS — I5042 Chronic combined systolic (congestive) and diastolic (congestive) heart failure: Secondary | ICD-10-CM | POA: Diagnosis not present

## 2022-07-06 ENCOUNTER — Ambulatory Visit: Payer: Medicare Other | Admitting: Cardiology

## 2022-07-06 LAB — BASIC METABOLIC PANEL
BUN/Creatinine Ratio: 13 (ref 10–24)
BUN: 17 mg/dL (ref 8–27)
CO2: 20 mmol/L (ref 20–29)
Calcium: 9.8 mg/dL (ref 8.6–10.2)
Chloride: 100 mmol/L (ref 96–106)
Creatinine, Ser: 1.26 mg/dL (ref 0.76–1.27)
Glucose: 112 mg/dL — ABNORMAL HIGH (ref 70–99)
Potassium: 5.8 mmol/L — ABNORMAL HIGH (ref 3.5–5.2)
Sodium: 137 mmol/L (ref 134–144)
eGFR: 58 mL/min/{1.73_m2} — ABNORMAL LOW (ref >59)

## 2022-07-06 LAB — BRAIN NATRIURETIC PEPTIDE

## 2022-07-06 NOTE — Progress Notes (Signed)
K 5.8 Spoke with patient's wife.  Avoid bananas (had 2 in last 24 hours) Hold losartan on 7/18 and 7/19. Take additional torsemide 20 mg today. I will see him on 7/20 as scheduled.   Nigel Mormon, MD Pager: [phone] Office: [phone]

## 2022-07-06 NOTE — Progress Notes (Signed)
Error

## 2022-07-08 ENCOUNTER — Ambulatory Visit: Payer: Self-pay | Admitting: *Deleted

## 2022-07-08 DIAGNOSIS — I25810 Atherosclerosis of coronary artery bypass graft(s) without angina pectoris: Secondary | ICD-10-CM | POA: Diagnosis not present

## 2022-07-08 DIAGNOSIS — I5020 Unspecified systolic (congestive) heart failure: Secondary | ICD-10-CM | POA: Diagnosis not present

## 2022-07-08 DIAGNOSIS — I739 Peripheral vascular disease, unspecified: Secondary | ICD-10-CM | POA: Diagnosis not present

## 2022-07-08 DIAGNOSIS — I480 Paroxysmal atrial fibrillation: Secondary | ICD-10-CM | POA: Diagnosis not present

## 2022-07-08 DIAGNOSIS — I130 Hypertensive heart and chronic kidney disease with heart failure and stage 1 through stage 4 chronic kidney disease, or unspecified chronic kidney disease: Secondary | ICD-10-CM | POA: Diagnosis not present

## 2022-07-08 DIAGNOSIS — N183 Chronic kidney disease, stage 3 unspecified: Secondary | ICD-10-CM | POA: Diagnosis not present

## 2022-07-08 NOTE — Patient Outreach (Signed)
  Care Coordination   Follow Up Visit Note   07/08/2022 Name: Ryan [name] MRN: [id number] DOB: [birth year]  Ryan [name] is a 81 y.o. year old male who sees Brigitte Pulse, Emily Filbert., MD for primary care. I spoke with  Ryan [name] by phone today  What matters to the patients health and wellness today?  Inner ear issues   Goals Addressed               This Visit's Progress     ongoing inner ear issues (pt-stated)        Care Coordination Interventions: Advised patient to discussed his inner issues. Also discussed possible ENT provider if this issues continues with interventions with continues. Pt verified he is taking vertigo medication.  Reviewed scheduled/upcoming provider appointments including Reviewed all appointments.         SDOH assessments and interventions completed:   Yes Pt has tendencies for some depression and spouse Ryan [name] indicated pt has anxiety medication however to strong but will call PCP tomorrow and address with office nurse Ryan [name]) for something for his depression and milder medication for his anxiety. Declined any form of counseling and no suicidal ideations at this time.  Care Coordination Interventions Activated:  Yes Care Coordination Interventions:   Yes, provided All appointments reviewed and verified with sufficient transportation. Follow up plan: No further intervention required.  Encounter Outcome:  Pt. Visit Completed   [name], RN Care Management Coordinator [place] Office [phone]

## 2022-07-09 ENCOUNTER — Encounter: Payer: Self-pay | Admitting: Cardiology

## 2022-07-09 ENCOUNTER — Ambulatory Visit: Payer: Medicare Other | Admitting: Cardiology

## 2022-07-09 VITALS — Temp 98.4°F | Resp 16 | Ht 66.0 in | Wt 136.6 lb

## 2022-07-09 DIAGNOSIS — I5042 Chronic combined systolic (congestive) and diastolic (congestive) heart failure: Secondary | ICD-10-CM

## 2022-07-09 DIAGNOSIS — I25118 Atherosclerotic heart disease of native coronary artery with other forms of angina pectoris: Secondary | ICD-10-CM | POA: Diagnosis not present

## 2022-07-09 DIAGNOSIS — I4819 Other persistent atrial fibrillation: Secondary | ICD-10-CM | POA: Diagnosis not present

## 2022-07-09 DIAGNOSIS — I200 Unstable angina: Secondary | ICD-10-CM | POA: Diagnosis not present

## 2022-07-09 NOTE — Progress Notes (Signed)
Patient referred by Ginger Organ., MD for chest pain, shortness of breath  Subjective:   Ryan [name], male    DOB: [birth year], 81 y.o.   MRN: [id number]   Chief Complaint  Patient presents with   Chronic combined systolic and diastolic heart failure    Follow-up      HPI  81 year old caucasian male with coronary artery disease status s/p CABG (patent LIMA-LAD, occluded SVG-ramus/OM Y graft), ischemic cardiomyopathy with recovered EF, s/p ICD, mod AS, persistent Afib, PAD s/o aortobifemoral bypass, mod carotid disease, h/o stroke, CKD III   He continues to have dizziness, but fortunately no recent falls. He denies chest pain, dyspnea on exertion, leg edema.    Current Outpatient Medications:    acetaminophen (TYLENOL) 325 MG tablet, Take 2 tablets (650 mg total) by mouth every 6 (six) hours as needed for mild pain (or Fever >/= 101)., Disp: 20 tablet, Rfl: 0   albuterol (VENTOLIN HFA) 108 (90 Base) MCG/ACT inhaler, Inhale 1 puff into the lungs as needed., Disp: , Rfl:    allopurinol (ZYLOPRIM) 100 MG tablet, Take 100 mg by mouth daily as needed (gout)., Disp: , Rfl:    ALPRAZolam (XANAX) 0.5 MG tablet, Take 0.5 mg by mouth 3 (three) times daily as needed for anxiety or sleep., Disp: , Rfl:    apixaban (ELIQUIS) 2.5 MG TABS tablet, Take 2.5 mg by mouth 2 (two) times daily., Disp: , Rfl:    aspirin EC 81 MG tablet, Take 1 tablet (81 mg total) by mouth daily. Swallow whole., Disp: 30 tablet, Rfl: 0   bisoprolol (ZEBETA) 5 MG tablet, Take 0.5 tablets (2.5 mg total) by mouth daily., Disp: 1 tablet, Rfl: 0   dapagliflozin propanediol (FARXIGA) 10 MG TABS tablet, Take 1 tablet (10 mg total) by mouth daily., Disp: 30 tablet, Rfl: 2   finasteride (PROSCAR) 5 MG tablet, Take 5 mg by mouth in the morning., Disp: , Rfl:    losartan (COZAAR) 25 MG tablet, Take 0.5 tablets (12.5 mg total) by mouth daily., Disp: 15 tablet, Rfl: 3   meclizine (ANTIVERT) 12.5 MG tablet, TAKE ONE TABLET  BY MOUTH THREE TIMES A DAY AS NEEDED FOR DIZZINESS, Disp: 60 tablet, Rfl: 1   methocarbamol (ROBAXIN) 500 MG tablet, Take 500 mg by mouth daily as needed for muscle spasms (back pain.)., Disp: , Rfl:    metoCLOPramide (REGLAN) 5 MG tablet, Take 1 tablet (5 mg total) by mouth every 6 (six) hours as needed for nausea., Disp: 15 tablet, Rfl: 0   nitroGLYCERIN (NITROSTAT) 0.4 MG SL tablet, Place 1 tablet (0.4 mg total) under the tongue every 5 (five) minutes as needed for chest pain., Disp: 25 tablet, Rfl: 3   ondansetron (ZOFRAN) 4 MG tablet, Take 4 mg by mouth every 8 (eight) hours as needed for nausea or vomiting., Disp: , Rfl:    pantoprazole (PROTONIX) 40 MG tablet, Take 1 tablet (40 mg total) by mouth daily., Disp: 30 tablet, Rfl: 11   polyethylene glycol powder (GLYCOLAX/MIRALAX) 17 GM/SCOOP powder, Take 17 g by mouth in the morning and at bedtime., Disp: , Rfl:    ranolazine (RANEXA) 500 MG 12 hr tablet, TAKE 1 TABLET BY MOUTH TWICE A DAY, Disp: 120 tablet, Rfl: 0   REPATHA SURECLICK 400 MG/ML SOAJ, INJECT 1 PEN INTO THE SKIN EVERY 14 DAYS. (Patient taking differently: Inject 140 mg into the skin every 14 (fourteen) days.), Disp: 2 mL, Rfl: 11   tamsulosin (FLOMAX)  0.4 MG CAPS capsule, Take 0.4 mg by mouth in the morning., Disp: , Rfl:    tobramycin (TOBREX) 0.3 % ophthalmic solution, Place 1 drop into the left eye 2 (two) times daily., Disp: , Rfl:    torsemide (DEMADEX) 20 MG tablet, Take 1 tablet (20 mg total) by mouth daily., Disp: 30 tablet, Rfl: 1  Cardiovascular and other pertinent studies:  EKG 03/16/2022: Atrial fibrillation 55 bpm  Diffuse nonspecific T-abnormality  Low voltage Old anteroseptal infarct   Echocardiogram 12/27/2021:  1. Left ventricular ejection fraction, by estimation, is 50 to 55%. The  left ventricle has low normal function. The left ventricle demonstrates  regional wall motion abnormalities (see scoring diagram/findings for  description). Diastolic function not   assessed due to Afib. There is severe hypokinesis of the left ventricular,  basal anteroseptal wall.   2. Right ventricular systolic function is mildly reduced. The right  ventricular size is normal. There is mildly elevated pulmonary artery  systolic pressure.   3. Left atrial size was severely dilated.   4. Right atrial size was moderately dilated.   5. The mitral valve is grossly normal. Mild mitral valve regurgitation.   6. Mild tricuspid regurgitation. peak RA-RV gradient 33 mmHg. . Tricuspid  valve regurgitation is mild.   7. Vmax 2.3 m/sec, mean PG 13 mmHg. AVA by continuity equation is 0.9  cm2, with dimensionless index of 0.24. Small LV cavity with LV SV index of  20 cc/m2. Likely paradoxically low flow low gradient severe aortic  stenosis. . The aortic valve is  tricuspid. There is moderate calcification of the aortic valve.   8. Overall, LVEF assessment remains difficult. No significant change  compared to previous study in 08/2021.   Coronary and bypass graft angiography 01/2019: Severe native coronary artery disease with diffuse proximal to mid LAD stenosis with diffusely diseased native LAD proximally and mid with competitive filling from the LIMA graft; 90% proximal stenosis in the ramus immediate vessel, diffuse stenosis of 60 to 70% in the proximal circumflex with diffuse 80% distal stenosis and total occlusion of the AV groove after marginal vessel with total occlusion of the proximal native RCA.  There is extensive collateralization to the distal RCA both via the circumflex vessel as well as via the LIMA to LAD and distal LAD.   Patent LIMA graft supplying the mid LAD   Previously documented old occlusion of the previous Y graft which had supplied the ramus and marginal vessel in the vein graft which had supplied the PDA.   Low right heart pressures with mean PA pressure at 19 mmHg.   RECOMMENDATION: Increase medical therapy.  The patient has only been on low-dose  anti-ischemic medical regimen consisting of bisoprolol 2.5 mg as well as isosorbide 60 mg.  Recommend initiation of amlodipine 5 mg.  Also recommend consideration of Ranexa 500 mg twice a day.  The patient is on rosuvastatin.  However if the patient is to be on atorvastatin, the maximum recommended dose of atorvastatin is 40 mg if the patient is on ranolazine.   Echocardiogram 08/28/2021: 1. Left ventricular ejection fraction, by estimation, is 50 to 55%. The left ventricle has low normal function. The left ventricle demonstrates regional wall motion abnormalities. Basal anterior/anteroseptal hypokinesis. There is severe asymmetric left ventricular hypertrophy of the basal-septal segment. Left ventricular diastolic parameters are indeterminate. 2. Right ventricular systolic function is moderately reduced. The right ventricular size is normal. There is normal pulmonary artery systolic pressure. 3. The mitral valve is  normal in structure. Trivial mitral valve regurgitation. No evidence of mitral stenosis. 4. The inferior vena cava is normal in size with greater than 50% respiratory variability, suggesting right atrial pressure of 3 mmHg. 5. The aortic valve is calcified. There is moderate calcification of the aortic valve. Aortic valve regurgitation is not visualized. Moderate aortic valve stenosis. Mean gradient only 51mHg, but AVA 1.0 cm^2 and DI 0.35, suspect paradoxical low flow low gradient moderate AS given low SV index (21cc/m^2) 6. Small mobile echodensity adjacent to RV pacemaker lead (seen on image 14), could represent fibrinous strand or small adherent thrombus; if clinical concern for infection would check blood cultures   Recent labs: 06/03/2022: Glucose 101, BUN/Cr 15/1.3. EGFR 53. Na/K 134/4.4.  H/H 13/39. MCV 102.7. Platelets 352 HbA1C 5.4% Chol 133, TG 87, HDL 45, LDL 71 TSH 17.9 high. Free T4 0.8 low normal  05/12/2022: BNP 1067  Review of Systems  HENT:  Positive for tinnitus.    Cardiovascular:  Negative for chest pain, dyspnea on exertion, leg swelling, palpitations and syncope.  Respiratory:  Negative for shortness of breath.   Gastrointestinal:  Positive for constipation and nausea.  Genitourinary:        Self catheterization at home due to urinary retention  Neurological:  Positive for dizziness.         Vitals:   07/09/22 1353 07/09/22 1354  Resp:    Temp:    SpO2: 99% 91%    Orthostatic VS for the past 72 hrs (Last 3 readings):  Orthostatic BP Patient Position BP Location Cuff Size Orthostatic Pulse  07/09/22 1354 145/61 Standing Left Arm Normal 54  07/09/22 1353 147/71 Sitting Left Arm Normal 62  07/09/22 1351 141/63 Supine Left Arm Normal 63     There is no height or weight on file to calculate BMI. Filed Weights   07/09/22 1351  Weight: 136 lb 9.6 oz (62 kg)     Objective:   Physical Exam Vitals and nursing note reviewed.  Constitutional:      General: He is not in acute distress. Neck:     Vascular: JVD present.  Cardiovascular:     Rate and Rhythm: Normal rate. Rhythm irregular.     Pulses:          Dorsalis pedis pulses are 0 on the right side and 0 on the left side.       Posterior tibial pulses are 0 on the right side and 1+ on the left side.     Heart sounds: Murmur heard.     Harsh midsystolic murmur is present with a grade of 2/6 at the upper right sternal border radiating to the neck.  Pulmonary:     Effort: Pulmonary effort is normal.     Breath sounds: Normal breath sounds. No wheezing or rales.  Musculoskeletal:     Right lower leg: Edema (1+) present.     Left lower leg: Edema (1+) present.        Assessment & Recommendations:   81y/o caucasian male with coronary artery disease status s/p CABG (patent LIMA-LAD, occluded SVG-ramus/OM Y graft), ischemic cardiomyopathy with recovered EF, s/p ICD, mod AS, persistent Afib, PAD s/o aortobifemoral bypass, mod carotid disease, h/o stroke, CKD III, now with chest  pain  Dizziness: I do not think his dizziness is of cardiac origin.  Given persistent dizziness, I have encouraged him to see ENT/neurology.  HFrEF: Clinically euvolemic. [place] due to K 5.8. Avoid bananas. Will  re-challenge with Entresto. Repeat BMP, and NT pro BNP in 4 days.   Continue Farxiga 10 mg daily, bisoprolol 2.5 mg daily.  Persistent A. fib/atrial flutter: Longstanding.  Rate controlled. High CHA2DS2VAsc score Continue Eliquis 5 mg daily.   Given that he continues to be in A-fib, I have discontinued his amiodarone.  H/o NSVT: No recent recurrence.  Discontinue amiodarone.  CKD stage III: Stable  PAD: S/p aortobifem bypass.  No critical limb ischemia.   F/u in 3 months    Nigel Mormon, MD Pager: [phone] Office: [phone]

## 2022-07-13 DIAGNOSIS — I480 Paroxysmal atrial fibrillation: Secondary | ICD-10-CM | POA: Diagnosis not present

## 2022-07-13 DIAGNOSIS — I130 Hypertensive heart and chronic kidney disease with heart failure and stage 1 through stage 4 chronic kidney disease, or unspecified chronic kidney disease: Secondary | ICD-10-CM | POA: Diagnosis not present

## 2022-07-13 DIAGNOSIS — I739 Peripheral vascular disease, unspecified: Secondary | ICD-10-CM | POA: Diagnosis not present

## 2022-07-13 DIAGNOSIS — I25810 Atherosclerosis of coronary artery bypass graft(s) without angina pectoris: Secondary | ICD-10-CM | POA: Diagnosis not present

## 2022-07-13 DIAGNOSIS — I5020 Unspecified systolic (congestive) heart failure: Secondary | ICD-10-CM | POA: Diagnosis not present

## 2022-07-13 DIAGNOSIS — N183 Chronic kidney disease, stage 3 unspecified: Secondary | ICD-10-CM | POA: Diagnosis not present

## 2022-07-14 DIAGNOSIS — R3912 Poor urinary stream: Secondary | ICD-10-CM | POA: Diagnosis not present

## 2022-07-14 DIAGNOSIS — R972 Elevated prostate specific antigen [PSA]: Secondary | ICD-10-CM | POA: Diagnosis not present

## 2022-07-14 DIAGNOSIS — N401 Enlarged prostate with lower urinary tract symptoms: Secondary | ICD-10-CM | POA: Diagnosis not present

## 2022-07-16 DIAGNOSIS — I5020 Unspecified systolic (congestive) heart failure: Secondary | ICD-10-CM | POA: Diagnosis not present

## 2022-07-16 DIAGNOSIS — I25810 Atherosclerosis of coronary artery bypass graft(s) without angina pectoris: Secondary | ICD-10-CM | POA: Diagnosis not present

## 2022-07-16 DIAGNOSIS — I739 Peripheral vascular disease, unspecified: Secondary | ICD-10-CM | POA: Diagnosis not present

## 2022-07-16 DIAGNOSIS — I480 Paroxysmal atrial fibrillation: Secondary | ICD-10-CM | POA: Diagnosis not present

## 2022-07-16 DIAGNOSIS — N183 Chronic kidney disease, stage 3 unspecified: Secondary | ICD-10-CM | POA: Diagnosis not present

## 2022-07-16 DIAGNOSIS — I130 Hypertensive heart and chronic kidney disease with heart failure and stage 1 through stage 4 chronic kidney disease, or unspecified chronic kidney disease: Secondary | ICD-10-CM | POA: Diagnosis not present

## 2022-07-17 ENCOUNTER — Telehealth: Payer: Self-pay

## 2022-07-17 DIAGNOSIS — I480 Paroxysmal atrial fibrillation: Secondary | ICD-10-CM | POA: Diagnosis not present

## 2022-07-17 DIAGNOSIS — I739 Peripheral vascular disease, unspecified: Secondary | ICD-10-CM | POA: Diagnosis not present

## 2022-07-17 DIAGNOSIS — N183 Chronic kidney disease, stage 3 unspecified: Secondary | ICD-10-CM | POA: Diagnosis not present

## 2022-07-17 DIAGNOSIS — I5020 Unspecified systolic (congestive) heart failure: Secondary | ICD-10-CM | POA: Diagnosis not present

## 2022-07-17 DIAGNOSIS — I25810 Atherosclerosis of coronary artery bypass graft(s) without angina pectoris: Secondary | ICD-10-CM | POA: Diagnosis not present

## 2022-07-17 DIAGNOSIS — I130 Hypertensive heart and chronic kidney disease with heart failure and stage 1 through stage 4 chronic kidney disease, or unspecified chronic kidney disease: Secondary | ICD-10-CM | POA: Diagnosis not present

## 2022-07-17 NOTE — Telephone Encounter (Signed)
Pt called to inform us that the farxiga is given him a rash and swelling on his lips. for the pass week, the rash is on his head, back and arms. Pt mention his has not been able to sleep at night. Pt would like if you could chance the medication

## 2022-07-17 NOTE — Telephone Encounter (Signed)
Please stop Farxiga immediately. If rash has severe itching, can take 1/2 tab of 25 mg benadryl. If rash worsens or there is any tongue swelling or difficulty breathing, please call 911 immediately.

## 2022-07-17 NOTE — Telephone Encounter (Signed)
Called pt to to inform him about the message above. Pt understood

## 2022-07-20 ENCOUNTER — Other Ambulatory Visit (HOSPITAL_COMMUNITY): Payer: Self-pay | Admitting: Cardiology

## 2022-07-20 DIAGNOSIS — I5042 Chronic combined systolic (congestive) and diastolic (congestive) heart failure: Secondary | ICD-10-CM | POA: Diagnosis not present

## 2022-07-20 NOTE — Telephone Encounter (Signed)
Pt wife called back to inform us that pt started the farxiga again and has not had any reaction to it. Pt will continue to take it until his appt with you

## 2022-07-20 NOTE — Telephone Encounter (Signed)
That is good to know.  Thanks MJP

## 2022-07-21 LAB — BRAIN NATRIURETIC PEPTIDE: BNP: 530.4 pg/mL — ABNORMAL HIGH (ref 0.0–100.0)

## 2022-07-21 NOTE — Progress Notes (Signed)
Remote ICD transmission.   

## 2022-07-22 DIAGNOSIS — I480 Paroxysmal atrial fibrillation: Secondary | ICD-10-CM | POA: Diagnosis not present

## 2022-07-22 DIAGNOSIS — I5020 Unspecified systolic (congestive) heart failure: Secondary | ICD-10-CM | POA: Diagnosis not present

## 2022-07-22 DIAGNOSIS — N183 Chronic kidney disease, stage 3 unspecified: Secondary | ICD-10-CM | POA: Diagnosis not present

## 2022-07-22 DIAGNOSIS — I25810 Atherosclerosis of coronary artery bypass graft(s) without angina pectoris: Secondary | ICD-10-CM | POA: Diagnosis not present

## 2022-07-22 DIAGNOSIS — I739 Peripheral vascular disease, unspecified: Secondary | ICD-10-CM | POA: Diagnosis not present

## 2022-07-22 DIAGNOSIS — I130 Hypertensive heart and chronic kidney disease with heart failure and stage 1 through stage 4 chronic kidney disease, or unspecified chronic kidney disease: Secondary | ICD-10-CM | POA: Diagnosis not present

## 2022-07-22 NOTE — Progress Notes (Signed)
Patient stated that he been taking [place] everyday since prescribed even after he was instructed to stop and he has been feeling great.

## 2022-07-22 NOTE — Progress Notes (Signed)
Yes. Rash hasn't happened again to my knowledge, so he should continue it.  Thanks MJP

## 2022-07-24 ENCOUNTER — Ambulatory Visit (INDEPENDENT_AMBULATORY_CARE_PROVIDER_SITE_OTHER): Payer: Medicare Other | Admitting: Podiatry

## 2022-07-24 DIAGNOSIS — K219 Gastro-esophageal reflux disease without esophagitis: Secondary | ICD-10-CM | POA: Diagnosis not present

## 2022-07-24 DIAGNOSIS — M7751 Other enthesopathy of right foot: Secondary | ICD-10-CM | POA: Diagnosis not present

## 2022-07-24 DIAGNOSIS — R490 Dysphonia: Secondary | ICD-10-CM | POA: Diagnosis not present

## 2022-07-24 DIAGNOSIS — I5022 Chronic systolic (congestive) heart failure: Secondary | ICD-10-CM | POA: Diagnosis not present

## 2022-07-24 DIAGNOSIS — L84 Corns and callosities: Secondary | ICD-10-CM

## 2022-07-24 DIAGNOSIS — R634 Abnormal weight loss: Secondary | ICD-10-CM | POA: Diagnosis not present

## 2022-07-24 DIAGNOSIS — H9313 Tinnitus, bilateral: Secondary | ICD-10-CM | POA: Diagnosis not present

## 2022-07-24 DIAGNOSIS — H8110 Benign paroxysmal vertigo, unspecified ear: Secondary | ICD-10-CM | POA: Diagnosis not present

## 2022-07-24 DIAGNOSIS — R1310 Dysphagia, unspecified: Secondary | ICD-10-CM | POA: Diagnosis not present

## 2022-07-24 MED ORDER — TRIAMCINOLONE ACETONIDE 10 MG/ML IJ SUSP
10.0000 mg | Freq: Once | INTRAMUSCULAR | Status: AC
  Administered 2022-07-24: 10 mg

## 2022-07-25 DIAGNOSIS — I472 Ventricular tachycardia, unspecified: Secondary | ICD-10-CM | POA: Diagnosis not present

## 2022-07-25 DIAGNOSIS — Z9181 History of falling: Secondary | ICD-10-CM | POA: Diagnosis not present

## 2022-07-25 DIAGNOSIS — G720 Drug-induced myopathy: Secondary | ICD-10-CM | POA: Diagnosis not present

## 2022-07-25 DIAGNOSIS — M109 Gout, unspecified: Secondary | ICD-10-CM | POA: Diagnosis not present

## 2022-07-25 DIAGNOSIS — I5020 Unspecified systolic (congestive) heart failure: Secondary | ICD-10-CM | POA: Diagnosis not present

## 2022-07-25 DIAGNOSIS — M4800 Spinal stenosis, site unspecified: Secondary | ICD-10-CM | POA: Diagnosis not present

## 2022-07-25 DIAGNOSIS — Z951 Presence of aortocoronary bypass graft: Secondary | ICD-10-CM | POA: Diagnosis not present

## 2022-07-25 DIAGNOSIS — I252 Old myocardial infarction: Secondary | ICD-10-CM | POA: Diagnosis not present

## 2022-07-25 DIAGNOSIS — Z7901 Long term (current) use of anticoagulants: Secondary | ICD-10-CM | POA: Diagnosis not present

## 2022-07-25 DIAGNOSIS — N400 Enlarged prostate without lower urinary tract symptoms: Secondary | ICD-10-CM | POA: Diagnosis not present

## 2022-07-25 DIAGNOSIS — I739 Peripheral vascular disease, unspecified: Secondary | ICD-10-CM | POA: Diagnosis not present

## 2022-07-25 DIAGNOSIS — I130 Hypertensive heart and chronic kidney disease with heart failure and stage 1 through stage 4 chronic kidney disease, or unspecified chronic kidney disease: Secondary | ICD-10-CM | POA: Diagnosis not present

## 2022-07-25 DIAGNOSIS — F419 Anxiety disorder, unspecified: Secondary | ICD-10-CM | POA: Diagnosis not present

## 2022-07-25 DIAGNOSIS — Z96612 Presence of left artificial shoulder joint: Secondary | ICD-10-CM | POA: Diagnosis not present

## 2022-07-25 DIAGNOSIS — I25810 Atherosclerosis of coronary artery bypass graft(s) without angina pectoris: Secondary | ICD-10-CM | POA: Diagnosis not present

## 2022-07-25 DIAGNOSIS — E785 Hyperlipidemia, unspecified: Secondary | ICD-10-CM | POA: Diagnosis not present

## 2022-07-25 DIAGNOSIS — Z87891 Personal history of nicotine dependence: Secondary | ICD-10-CM | POA: Diagnosis not present

## 2022-07-25 DIAGNOSIS — Z87442 Personal history of urinary calculi: Secondary | ICD-10-CM | POA: Diagnosis not present

## 2022-07-25 DIAGNOSIS — N183 Chronic kidney disease, stage 3 unspecified: Secondary | ICD-10-CM | POA: Diagnosis not present

## 2022-07-25 DIAGNOSIS — E039 Hypothyroidism, unspecified: Secondary | ICD-10-CM | POA: Diagnosis not present

## 2022-07-25 DIAGNOSIS — I6529 Occlusion and stenosis of unspecified carotid artery: Secondary | ICD-10-CM | POA: Diagnosis not present

## 2022-07-25 DIAGNOSIS — Z86010 Personal history of colonic polyps: Secondary | ICD-10-CM | POA: Diagnosis not present

## 2022-07-25 DIAGNOSIS — I480 Paroxysmal atrial fibrillation: Secondary | ICD-10-CM | POA: Diagnosis not present

## 2022-07-25 DIAGNOSIS — I700 Atherosclerosis of aorta: Secondary | ICD-10-CM | POA: Diagnosis not present

## 2022-07-25 DIAGNOSIS — Z96642 Presence of left artificial hip joint: Secondary | ICD-10-CM | POA: Diagnosis not present

## 2022-07-25 NOTE — Progress Notes (Signed)
Subjective:   Patient ID: Ryan [name], male   DOB: 81 y.o.   MRN: [id number]   HPI Patient presents with a lot of inflammation underneath the fifth metatarsal head right and 2 different lesions on the right foot that are becoming painful and making it hard to walk.  States its just been over the last few months   ROS      Objective:  Physical Exam  Neurovascular status intact with inflammation fluid around the fifth MPJ right that is painful when pressed and lesion formation underneath the first metatarsal head right foot that is painful     Assessment:  Inflammatory capsulitis of the fifth MPJ right with pain along with lesion formation right painful when pressed     Plan:  H&P educated on bone issues and the possibility for head resection at 1 point in future reviewing x-ray.  Today I did a sterile prep injected the fifth MPJ plantar capsule 3 mg dexamethasone Kenalog 5 mg Xylocaine I debrided lesion right no iatrogenic bleeding and he will be seen back as symptoms indicate  X-rays were negative for signs of any kind of fracture event or arthritic event associated with the pain he is experiencing

## 2022-07-29 ENCOUNTER — Ambulatory Visit: Payer: Medicare Other | Attending: Adult Health

## 2022-07-29 VITALS — BP 119/76 | HR 66

## 2022-07-29 DIAGNOSIS — R42 Dizziness and giddiness: Secondary | ICD-10-CM | POA: Diagnosis not present

## 2022-07-29 DIAGNOSIS — R2681 Unsteadiness on feet: Secondary | ICD-10-CM | POA: Diagnosis not present

## 2022-07-29 NOTE — Therapy (Signed)
OUTPATIENT PHYSICAL THERAPY VESTIBULAR EVALUATION     Patient Name: Ryan [name] MRN: [id number] DOB:[birth year], 81 y.o., male Today's Date: 07/29/2022  PCP: Link Snuffer, MD REFERRING PROVIDER: Reginold Agent, NP   PT End of Session - 07/29/22 0957     Visit Number 1    Number of Visits 1    Date for PT Re-Evaluation 07/30/22    Authorization Type Medicare A+B/AARP    PT Start Time 1010    PT Stop Time 1055    PT Time Calculation (min) 45 min    Activity Tolerance Other (comment)   Increased Dizziness/Nausea   Behavior During Therapy [facility] for tasks assessed/performed             Past Medical History:  Diagnosis Date   Acquired thrombophilia ([place]) 11/01/2021   Arthritis    CAD (coronary artery disease)    Carotid artery occlusion    Cataract    Bil eyes/worse in left eye   CHF (congestive heart failure) ([place])    Chronic back pain    COVID-19 10/31/2021   DVT (deep venous thrombosis) (HCC)    Dysrhythmia    Enlarged prostate    takes Rapaflo daily   GERD (gastroesophageal reflux disease)    occasional   History of colon polyps    History of gout    has colchicine prn   History of kidney stones    Hyperlipidemia    takes Crestor daily   Hypertension    takes Amlodipine daily   Hypothyroidism    Myocardial infarction [facility])    Peripheral vascular disease (HCC)    Prolonged QT interval 11/01/2021   Pulmonary emboli ([place]) 03/20/2015   elevated d-dimer, intermediate V/Q study, atypical chest pain and SOB. Start on Xarelto '20mg'$  BID for 3 month   Rapid atrial fibrillation ([place])    Renal insufficiency    Shortness of breath dyspnea    Urinary frequency    Urinary urgency    Past Surgical History:  Procedure Laterality Date   APPENDECTOMY     BACK SURGERY     5 times   big toe surgery     CARDIAC CATHETERIZATION     2010    dr Acie Fredrickson   cataract surgery     left eye   CHOLECYSTECTOMY N/A 07/27/2016   Procedure: LAPAROSCOPIC CHOLECYSTECTOMY;   Surgeon: Mickeal Skinner, MD;  Location: [place];  Service: General;  Laterality: N/A;   COLONOSCOPY     CORONARY ARTERY BYPASS GRAFT N/A 04/05/2015   Procedure: CORONARY ARTERY BYPASS GRAFTING (CABG)X4 LIMA-LAD; SVG-DIAG1-DIAG2; SVG-PD;  Surgeon: [name], MD;  Location: [place];  Service: Open Heart Surgery;  Laterality: N/A;   CORONARY BALLOON ANGIOPLASTY N/A 04/14/2022   Procedure: CORONARY BALLOON ANGIOPLASTY;  Surgeon: Nigel Mormon, MD;  Location: [place] CV LAB;  Service: Cardiovascular;  Laterality: N/A;   CORONARY/GRAFT ANGIOGRAPHY N/A 04/22/2018   Procedure: CORONARY/GRAFT ANGIOGRAPHY;  Surgeon: Martinique, Peter M, MD;  Location: [place] CV LAB;  Service: Cardiovascular;  Laterality: N/A;   CYSTOSCOPY     ENDARTERECTOMY Left 04/24/2016   Procedure: ENDARTERECTOMY LEFT CAROTID;  Surgeon: Mal Misty, MD;  Location: [place];  Service: Vascular;  Laterality: Left;   EYE SURGERY     FEMORAL ARTERY - POPLITEAL ARTERY BYPASS GRAFT     ICD IMPLANT N/A 12/30/2020   Procedure: ICD IMPLANT;  Surgeon: Evans Lance, MD;  Location: [place] CV LAB;  Service: Cardiovascular;  Laterality: N/A;  INTRAVASCULAR ULTRASOUND/IVUS N/A 04/14/2022   Procedure: Intravascular Ultrasound/IVUS;  Surgeon: Nigel Mormon, MD;  Location: [place] CV LAB;  Service: Cardiovascular;  Laterality: N/A;   JOINT REPLACEMENT     shoulder   LEFT HEART CATH N/A 04/14/2022   Procedure: Left Heart Cath;  Surgeon: Nigel Mormon, MD;  Location: [place] CV LAB;  Service: Cardiovascular;  Laterality: N/A;   LEFT HEART CATH AND CORONARY ANGIOGRAPHY N/A 04/24/2022   Procedure: LEFT HEART CATH AND CORONARY ANGIOGRAPHY;  Surgeon: Adrian Prows, MD;  Location: [place] CV LAB;  Service: Cardiovascular;  Laterality: N/A;   LEFT HEART CATHETERIZATION WITH CORONARY ANGIOGRAM N/A 04/03/2015   Procedure: LEFT HEART CATHETERIZATION WITH CORONARY ANGIOGRAM;  Surgeon: Troy Sine, MD;  Location:  [facility] CATH LAB;  Service: Cardiovascular;  Laterality: N/A;   LUMBAR LAMINECTOMY  01/06/2013   Procedure: MICRODISCECTOMY LUMBAR LAMINECTOMY;  Surgeon: Marybelle Killings, MD;  Location: [place];  Service: Orthopedics;  Laterality: N/A;  L3-4 decompression   LUMBAR LAMINECTOMY/DECOMPRESSION MICRODISCECTOMY  02/12/2012   Procedure: LUMBAR LAMINECTOMY/DECOMPRESSION MICRODISCECTOMY;  Surgeon: Floyce Stakes, MD;  Location: [place] NEURO ORS;  Service: Neurosurgery;  Laterality: N/A;  Lumbar four-five laminectomy   PATCH ANGIOPLASTY Left 04/24/2016   Procedure: LEFT CAROTID ARTERY PATCH ANGIOPLASTY;  Surgeon: Mal Misty, MD;  Location: [place];  Service: Vascular;  Laterality: Left;   RIGHT HEART CATH AND CORONARY/GRAFT ANGIOGRAPHY N/A 01/30/2019   Procedure: RIGHT HEART CATH AND CORONARY/GRAFT ANGIOGRAPHY;  Surgeon: Troy Sine, MD;  Location: [place] CV LAB;  Service: Cardiovascular;  Laterality: N/A;   RIGHT/LEFT HEART CATH AND CORONARY ANGIOGRAPHY N/A 03/17/2022   Procedure: RIGHT/LEFT HEART CATH AND CORONARY ANGIOGRAPHY;  Surgeon: Nigel Mormon, MD;  Location: [place] CV LAB;  Service: Cardiovascular;  Laterality: N/A;   STERIOD INJECTION Right 01/09/2014   Procedure: STEROID INJECTION;  Surgeon: Mcarthur Rossetti, MD;  Location: [place];  Service: Orthopedics;  Laterality: Right;   TEE WITHOUT CARDIOVERSION N/A 04/05/2015   Procedure: TRANSESOPHAGEAL ECHOCARDIOGRAM (TEE);  Surgeon: [name], MD;  Location: [place];  Service: Open Heart Surgery;  Laterality: N/A;   TOTAL HIP ARTHROPLASTY Left 01/09/2014   DR Ninfa Linden   TOTAL HIP ARTHROPLASTY Left 01/09/2014   Procedure: LEFT TOTAL HIP ARTHROPLASTY ANTERIOR APPROACH and Steroid Injection Right hip;  Surgeon: Mcarthur Rossetti, MD;  Location: [place];  Service: Orthopedics;  Laterality: Left;   TOTAL HIP ARTHROPLASTY Right 08/15/2019   TOTAL HIP ARTHROPLASTY Right 08/15/2019   Procedure: RIGHT TOTAL HIP ARTHROPLASTY ANTERIOR  APPROACH;  Surgeon: Mcarthur Rossetti, MD;  Location: [place];  Service: Orthopedics;  Laterality: Right;   Patient Active Problem List   Diagnosis Date Noted   Acute respiratory failure ([place]) 04/23/2022   Acute pulmonary edema (HCC)    CAD (coronary artery disease) 04/14/2022   Unstable angina ([place]) 03/16/2022   HFrEF (heart failure with reduced ejection fraction) ([place]) 12/31/2021   Acute heart failure with preserved ejection fraction ([place]) 12/26/2021   Complete tear of right rotator cuff 11/21/2021   VT (ventricular tachycardia) ([place]) 11/18/2021   Orthostatic hypotension 11/12/2021   Nausea 11/12/2021   Aortic stenosis 11/11/2021   Community acquired pneumonia 11/01/2021   Prolonged QT interval 11/01/2021   Acquired thrombophilia ([employer]) 11/01/2021   Aortic atherosclerosis ([place]) 11/01/2021   Intractable nausea and vomiting 09/10/2021   Hypoxemia    Status post implantation of automatic cardioverter/defibrillator (AICD) 03/22/2021   Ventricular tachycardia ([place]) 12/28/2020   Chronic heart  failure with preserved ejection fraction ([place]) 12/28/2020   NSTEMI (non-ST elevated myocardial infarction) ([place])    Acute respiratory failure with hypoxia ([place])    S/P total hip arthroplasty 08/16/2019   Unilateral primary osteoarthritis, right hip 07/18/2019   Persistent atrial fibrillation ([place]) 01/27/2019   Vertigo 01/27/2019   History of pneumonia 01/27/2019   Stage 3a chronic kidney disease (CKD) ([place]) 01/27/2019   Chest pain 02/07/2018   Elevated troponin 03/23/2017   Sigmoid diverticulitis 03/23/2017   Wrist pain, acute, right    Acute on chronic systolic heart failure ([place])    Subclinical hypothyroidism    Dyspnea 02/26/2017   Shoulder blade pain 02/26/2017   SOB (shortness of breath) 02/26/2017   Chronic right shoulder pain    Chronic combined systolic and diastolic heart failure (HCC)    Hyperlipidemia LDL goal <70 01/20/2017   Chronic cholecystitis 07/27/2016   PAD  (peripheral artery disease) ([place]) 07/09/2015   S/P CABG x 4 04/05/2015   Atypical chest pain 03/20/2015   Carotid artery disease ([place]) 10/09/2014   PVD (peripheral vascular disease) ([place]) 10/09/2014   HTN (hypertension) 05/30/2014   Arthritis of left hip 01/09/2014   Status post THR (total hip replacement) 01/09/2014   Spinal stenosis, lumbar 01/06/2013    Class: Diagnosis of   Coronary artery disease 01/05/2013   Atherosclerosis of native artery of extremity with intermittent claudication ([place]) 10/18/2012    ONSET DATE: 07/27/22 (Referral Date)  REFERRING DIAG: H81.10 (ICD-10-CM) - Benign paroxysmal vertigo, unspecified ear  THERAPY DIAG:  Dizziness and giddiness - Plan: PT plan of care cert/re-cert  Unsteadiness on feet  Rationale for Evaluation and Treatment Rehabilitation  SUBJECTIVE:   SUBJECTIVE STATEMENT: Patient reports that he continues to have significant dizziness and nausea. Patient reports that he has spinning sensation. Has tinnitus in the R ear. Woke up this morning and was having a bad day. Has had three to four falls in the past six months. Patient reports was lightheaded at that moment. Reports he has been staggering and feels off balance. Has been going on for 1-2 years. Reports it has been worse since he was here last year for evaluation. Denies hearing changes. Reports vision is blurred. No double vision. No recent ear infection or concussions noted. Reports difficulty swallowing and resting tremor noted when seated in session. Pt accompanied by:  Letta Median (Wife)  PERTINENT HISTORY:  coronary artery disease status s/p CABG, ischemic cardiomyopathy with recovered EF, s/p ICD, mod AS, persistent Afib, PAD s/o aortobifemoral bypass, mod carotid disease, h/o stroke, CKD II, GERD, Gout, HTN, HLD, Hypothyroidism, h/o of DVT, COVID-19, and CHF   PAIN:  Are you having pain? No  PRECAUTIONS: Fall and ICD/Pacemaker  WEIGHT BEARING RESTRICTIONS No  FALLS: Has patient  fallen in last 6 months? Yes. Number of falls 3-4  LIVING ENVIRONMENT: Lives with: lives with their spouse Lives in: House/apartment Has following equipment at home: Single point cane; just got SPC last week  PLOF: Independent with basic ADLs, Independent with household mobility without device, and Independent with community mobility without device  PATIENT GOALS Improve Dizziness  OBJECTIVE:   DIAGNOSTIC FINDINGS: No Imaging Completed relevant to issue  COGNITION: Overall cognitive status: Within functional limits for tasks assessed   SENSATION: WFL  POSTURE: rounded shoulders and forward head  Cervical ROM:    Active A/PROM (deg) eval  Flexion   Extension   Right lateral flexion   Left lateral flexion   Right rotation   Left rotation   (  Blank rows = not tested)  STRENGTH: General Decreased Sensation Noted  LOWER EXTREMITY MMT:   MMT Right eval Left eval  Hip flexion    Hip abduction    Hip adduction    Hip internal rotation    Hip external rotation    Knee flexion    Knee extension    Ankle dorsiflexion    Ankle plantarflexion    Ankle inversion    Ankle eversion    (Blank rows = not tested)   TRANSFERS: Assistive device utilized: None  Sit to stand: Modified independence Stand to sit: Modified independence  GAIT: Gait pattern: step through pattern Distance walked: clinic distance Assistive device utilized: None Level of assistance: Modified independence Comments: mild unsteadiness noted  PATIENT SURVEYS:  FOTO DPS: 39 and DFS: 43.3    VESTIBULAR ASSESSMENT     SYMPTOM BEHAVIOR:   Subjective history: Seen here for Eval in August 2022 for Dizziness. No BPPV at that time. See Subjective   Non-Vestibular symptoms: changes in vision and nausea/vomiting; aural fullness (patient reports relief after ears "pop")   Type of dizziness: Blurred Vision, Imbalance (Disequilibrium), Spinning/Vertigo, Unsteady with head/body turns, and  Lightheadedness/Faint   Frequency: frequently, couples times week   Duration: hours to all day   Aggravating factors: Spontaneous   Relieving factors: no known relieving factors   Progression of symptoms: worse   OCULOMOTOR EXAM:   Ocular Alignment: normal;    Ocular ROM: No Limitations   Spontaneous Nystagmus: absent   Gaze-Induced Nystagmus: age appropriate nystagmus at end range   Smooth Pursuits: intact; moderate dizziness   Saccades: hypometric/undershoots; more dizziness looking to the L > R.      VESTIBULAR - OCULAR REFLEX:    Slow VOR: Normal; reports mild dizziness with completion   VOR Cancellation: Normal   Head-Impulse Test: HIT Right: negative HIT Left: negative; no positive HIT noted, but resistance to passive ROM. Pt having moderate dizziness after completion of HIT   Dynamic Visual Acuity: Not able to be assessed    POSITIONAL TESTING: Right Roll Test: no nystagmus Left Roll Test: no nystagmus Right Sidelying: no nystagmus Left Sidelying: no nystagmus   MOTION SENSITIVITY:    Motion Sensitivity Quotient  Intensity: 0 = none, 1 = Lightheaded, 2 = Mild, 3 = Moderate, 4 = Severe, 5 = Vomiting  Intensity  1. Sitting to supine 0  2. Supine to L side 2  3. Supine to R side 2  4. Supine to sitting 4  5. L Hallpike-Dix   6. Up from L    7. R Hallpike-Dix   8. Up from R    9. Sitting, head  tipped to L knee 2  10. Head up from L  knee 2  11. Sitting, head  tipped to R knee 2  12. Head up from R  knee 2  13. Sitting head turns x5 2  14.Sitting head nods x5 3  15. In stance, 180  turn to L  2  16. In stance, 180  turn to R 2   VBI Screen: Negative  FUNCTIONAL GAIT:  TBA at future date  PATIENT EDUCATION: Education details: Follow up with ENT/Neurology and then planned to return to Vestibular Rehab to focus on  Person educated: Patient and Spouse Education method: Explanation Education comprehension: verbalized understanding   GOALS: Goals  reviewed with patient? Yes  SHORT TERM GOALS and LONG TERM GOALS  ASSESSMENT:  CLINICAL IMPRESSION: Patient is a 81 y.o. male referred to  Neuro OPPT services for Vertigo/BPPV. Patient's PMH significant for the following: coronary artery disease status s/p CABG, ischemic cardiomyopathy with recovered EF, s/p ICD, mod AS, persistent Afib, PAD s/o aortobifemoral bypass, mod carotid disease, h/o stroke, CKD II, GERD, Gout, HTN, HLD, Hypothyroidism, h/o of DVT, COVID-19, and CHF. Upon evaluation, patient presents with the following impairments: dizziness, abnormal oculomotor exam and increased motion sensitivity, unsteadiness and imbalance demonstrating increased fall risk. Patient has history of orthostatics. Negative positional testing noted today. Patient did demonstrate resting tremor today. PT educating to follow up with ENT and Neurology regarding vertigo, swallowing deficits and resting tremor. Patient will benefit from skilled PT services to address impairments.     OBJECTIVE IMPAIRMENTS decreased activity tolerance, decreased balance, dizziness, and postural dysfunction.   ACTIVITY LIMITATIONS transfers, bed mobility, and locomotion level  PARTICIPATION LIMITATIONS: community activity  PERSONAL FACTORS Age, Time since onset of injury/illness/exacerbation, and 3+ comorbidities: coronary artery disease status s/p CABG, ischemic cardiomyopathy with recovered EF, s/p ICD, mod AS, persistent Afib, PAD s/o aortobifemoral bypass, mod carotid disease, h/o stroke, CKD II, GERD, Gout, HTN, HLD, Hypothyroidism, h/o of DVT, COVID-19, and CHF  are also affecting patient's functional outcome.   REHAB POTENTIAL: Good  CLINICAL DECISION MAKING: Evolving/moderate complexity  EVALUATION COMPLEXITY: Moderate   PLAN: PT FREQUENCY: one time visit  PT DURATION:  additional sessions to be determined after results from ENT/Neurology  PLANNED INTERVENTIONS: Therapeutic exercises, Therapeutic activity,  Neuromuscular re-education, Balance training, Gait training, Patient/Family education, Self Care, Joint mobilization, Stair training, Vestibular training, Canalith repositioning, DME instructions, Aquatic Therapy, Manual therapy, and Re-evaluation  PLAN FOR NEXT SESSION: Patient to return after being seen by ENT/Neuro, will need to be reassessed and STG/LTG to be set as applicable.    Kirtland Bouchard, PT, DPT 07/29/2022, 11:19 AM

## 2022-08-03 DIAGNOSIS — I480 Paroxysmal atrial fibrillation: Secondary | ICD-10-CM | POA: Diagnosis not present

## 2022-08-03 DIAGNOSIS — N183 Chronic kidney disease, stage 3 unspecified: Secondary | ICD-10-CM | POA: Diagnosis not present

## 2022-08-03 DIAGNOSIS — I5020 Unspecified systolic (congestive) heart failure: Secondary | ICD-10-CM | POA: Diagnosis not present

## 2022-08-03 DIAGNOSIS — I130 Hypertensive heart and chronic kidney disease with heart failure and stage 1 through stage 4 chronic kidney disease, or unspecified chronic kidney disease: Secondary | ICD-10-CM | POA: Diagnosis not present

## 2022-08-03 DIAGNOSIS — I739 Peripheral vascular disease, unspecified: Secondary | ICD-10-CM | POA: Diagnosis not present

## 2022-08-03 DIAGNOSIS — I25810 Atherosclerosis of coronary artery bypass graft(s) without angina pectoris: Secondary | ICD-10-CM | POA: Diagnosis not present

## 2022-08-11 DIAGNOSIS — N183 Chronic kidney disease, stage 3 unspecified: Secondary | ICD-10-CM | POA: Diagnosis not present

## 2022-08-11 DIAGNOSIS — I739 Peripheral vascular disease, unspecified: Secondary | ICD-10-CM | POA: Diagnosis not present

## 2022-08-11 DIAGNOSIS — I480 Paroxysmal atrial fibrillation: Secondary | ICD-10-CM | POA: Diagnosis not present

## 2022-08-11 DIAGNOSIS — I130 Hypertensive heart and chronic kidney disease with heart failure and stage 1 through stage 4 chronic kidney disease, or unspecified chronic kidney disease: Secondary | ICD-10-CM | POA: Diagnosis not present

## 2022-08-11 DIAGNOSIS — I5020 Unspecified systolic (congestive) heart failure: Secondary | ICD-10-CM | POA: Diagnosis not present

## 2022-08-11 DIAGNOSIS — I25810 Atherosclerosis of coronary artery bypass graft(s) without angina pectoris: Secondary | ICD-10-CM | POA: Diagnosis not present

## 2022-08-12 DIAGNOSIS — I739 Peripheral vascular disease, unspecified: Secondary | ICD-10-CM | POA: Diagnosis not present

## 2022-08-12 DIAGNOSIS — Z7901 Long term (current) use of anticoagulants: Secondary | ICD-10-CM | POA: Diagnosis not present

## 2022-08-12 DIAGNOSIS — N183 Chronic kidney disease, stage 3 unspecified: Secondary | ICD-10-CM | POA: Diagnosis not present

## 2022-08-12 DIAGNOSIS — M7021 Olecranon bursitis, right elbow: Secondary | ICD-10-CM | POA: Diagnosis not present

## 2022-08-12 DIAGNOSIS — I5020 Unspecified systolic (congestive) heart failure: Secondary | ICD-10-CM | POA: Diagnosis not present

## 2022-08-12 DIAGNOSIS — M109 Gout, unspecified: Secondary | ICD-10-CM | POA: Diagnosis not present

## 2022-08-12 DIAGNOSIS — N1831 Chronic kidney disease, stage 3a: Secondary | ICD-10-CM | POA: Diagnosis not present

## 2022-08-12 DIAGNOSIS — I480 Paroxysmal atrial fibrillation: Secondary | ICD-10-CM | POA: Diagnosis not present

## 2022-08-12 DIAGNOSIS — I10 Essential (primary) hypertension: Secondary | ICD-10-CM | POA: Diagnosis not present

## 2022-08-12 DIAGNOSIS — I25810 Atherosclerosis of coronary artery bypass graft(s) without angina pectoris: Secondary | ICD-10-CM | POA: Diagnosis not present

## 2022-08-12 DIAGNOSIS — I130 Hypertensive heart and chronic kidney disease with heart failure and stage 1 through stage 4 chronic kidney disease, or unspecified chronic kidney disease: Secondary | ICD-10-CM | POA: Diagnosis not present

## 2022-08-17 ENCOUNTER — Ambulatory Visit (INDEPENDENT_AMBULATORY_CARE_PROVIDER_SITE_OTHER): Payer: Medicare Other | Admitting: Orthopaedic Surgery

## 2022-08-17 ENCOUNTER — Encounter: Payer: Self-pay | Admitting: Orthopaedic Surgery

## 2022-08-17 DIAGNOSIS — I200 Unstable angina: Secondary | ICD-10-CM

## 2022-08-17 DIAGNOSIS — M7021 Olecranon bursitis, right elbow: Secondary | ICD-10-CM

## 2022-08-17 NOTE — Progress Notes (Signed)
The patient is a very pleasant 81 year old gentleman that I have seen remotely in the past.  He is sent from his primary care physician Dr. Marton Redwood to evaluate and treat right elbow olecranon bursitis.  Reports that he does rest his elbow on the armrest of the chair for long period time while he watches TV.  He noted swelling in that area and was sent to [place] for for evaluation and treatment knowing that this is likely benign olecranon bursitis.  He says it does not hurt and has never been red.  He does have a large fluid collection over his olecranon area.  It is benign in terms of no redness.  He is pain-free with this area and his elbow moves well.  I did place an 18-gauge needle in the bursa sac.  I was only able to aspirate a small amount of fluid from this because a lot of the tissue was redundant.  I did express a lot of fluid out of the needle site where I did inject and try to aspirate the elbow.  This did decompress the area significantly.  I did place a compressive dressing over this.  He understands this will likely reaccumulate and he should try not to rest his elbow on anything for the next few days.  If it does reaccumulate and becomes bothersome for him.  He can just call our office directly to be seen.  All questions and concerns were answered and addressed.  Follow-up is as needed.

## 2022-08-28 ENCOUNTER — Other Ambulatory Visit: Payer: Self-pay | Admitting: Cardiology

## 2022-08-28 DIAGNOSIS — I5042 Chronic combined systolic (congestive) and diastolic (congestive) heart failure: Secondary | ICD-10-CM

## 2022-09-20 ENCOUNTER — Other Ambulatory Visit: Payer: Self-pay | Admitting: Cardiovascular Disease

## 2022-09-30 ENCOUNTER — Ambulatory Visit (INDEPENDENT_AMBULATORY_CARE_PROVIDER_SITE_OTHER): Payer: Medicare Other

## 2022-09-30 DIAGNOSIS — I739 Peripheral vascular disease, unspecified: Secondary | ICD-10-CM | POA: Diagnosis not present

## 2022-09-30 DIAGNOSIS — Z23 Encounter for immunization: Secondary | ICD-10-CM | POA: Diagnosis not present

## 2022-09-30 DIAGNOSIS — N1831 Chronic kidney disease, stage 3a: Secondary | ICD-10-CM | POA: Diagnosis not present

## 2022-09-30 DIAGNOSIS — I5020 Unspecified systolic (congestive) heart failure: Secondary | ICD-10-CM | POA: Diagnosis not present

## 2022-09-30 DIAGNOSIS — E785 Hyperlipidemia, unspecified: Secondary | ICD-10-CM | POA: Diagnosis not present

## 2022-09-30 DIAGNOSIS — I480 Paroxysmal atrial fibrillation: Secondary | ICD-10-CM | POA: Diagnosis not present

## 2022-09-30 DIAGNOSIS — D6869 Other thrombophilia: Secondary | ICD-10-CM | POA: Diagnosis not present

## 2022-09-30 DIAGNOSIS — I25810 Atherosclerosis of coronary artery bypass graft(s) without angina pectoris: Secondary | ICD-10-CM | POA: Diagnosis not present

## 2022-09-30 DIAGNOSIS — I130 Hypertensive heart and chronic kidney disease with heart failure and stage 1 through stage 4 chronic kidney disease, or unspecified chronic kidney disease: Secondary | ICD-10-CM | POA: Diagnosis not present

## 2022-09-30 DIAGNOSIS — I10 Essential (primary) hypertension: Secondary | ICD-10-CM | POA: Diagnosis not present

## 2022-09-30 DIAGNOSIS — I472 Ventricular tachycardia, unspecified: Secondary | ICD-10-CM

## 2022-09-30 DIAGNOSIS — I700 Atherosclerosis of aorta: Secondary | ICD-10-CM | POA: Diagnosis not present

## 2022-09-30 DIAGNOSIS — Z7901 Long term (current) use of anticoagulants: Secondary | ICD-10-CM | POA: Diagnosis not present

## 2022-09-30 DIAGNOSIS — G720 Drug-induced myopathy: Secondary | ICD-10-CM | POA: Diagnosis not present

## 2022-09-30 LAB — CUP PACEART REMOTE DEVICE CHECK
Battery Remaining Longevity: 174 mo
Battery Remaining Percentage: 100 %
Brady Statistic RV Percent Paced: 3 %
Date Time Interrogation Session: 20231011070400
HighPow Impedance: 67 Ohm
Implantable Lead Implant Date: 20220110
Implantable Lead Location: 753860
Implantable Lead Model: 137
Implantable Lead Serial Number: 301176
Implantable Pulse Generator Implant Date: 20220110
Lead Channel Impedance Value: 626 Ohm
Lead Channel Setting Pacing Amplitude: 2.5 V
Lead Channel Setting Pacing Pulse Width: 0.4 ms
Lead Channel Setting Sensing Sensitivity: 0.5 mV
Pulse Gen Serial Number: 211464

## 2022-10-07 ENCOUNTER — Ambulatory Visit: Payer: Medicare Other | Admitting: Cardiology

## 2022-10-13 NOTE — Progress Notes (Signed)
Remote ICD transmission.   

## 2022-10-19 DIAGNOSIS — H9313 Tinnitus, bilateral: Secondary | ICD-10-CM | POA: Diagnosis not present

## 2022-10-19 DIAGNOSIS — R1314 Dysphagia, pharyngoesophageal phase: Secondary | ICD-10-CM | POA: Diagnosis not present

## 2022-10-19 DIAGNOSIS — H903 Sensorineural hearing loss, bilateral: Secondary | ICD-10-CM | POA: Diagnosis not present

## 2022-10-19 DIAGNOSIS — H9113 Presbycusis, bilateral: Secondary | ICD-10-CM | POA: Diagnosis not present

## 2022-10-20 ENCOUNTER — Other Ambulatory Visit: Payer: Self-pay | Admitting: Otolaryngology

## 2022-10-20 DIAGNOSIS — R1314 Dysphagia, pharyngoesophageal phase: Secondary | ICD-10-CM

## 2022-10-22 ENCOUNTER — Other Ambulatory Visit: Payer: Self-pay | Admitting: Cardiology

## 2022-10-23 ENCOUNTER — Other Ambulatory Visit: Payer: Self-pay | Admitting: Cardiology

## 2022-10-23 DIAGNOSIS — R42 Dizziness and giddiness: Secondary | ICD-10-CM

## 2022-10-27 ENCOUNTER — Ambulatory Visit
Admission: RE | Admit: 2022-10-27 | Discharge: 2022-10-27 | Disposition: A | Discharge status: Home / Self Care | Payer: Medicare Other | Source: Ambulatory Visit | Attending: Otolaryngology | Admitting: Otolaryngology

## 2022-10-27 DIAGNOSIS — R1310 Dysphagia, unspecified: Secondary | ICD-10-CM | POA: Diagnosis not present

## 2022-10-27 DIAGNOSIS — R1314 Dysphagia, pharyngoesophageal phase: Secondary | ICD-10-CM

## 2022-10-29 ENCOUNTER — Encounter: Payer: Self-pay | Admitting: Cardiology

## 2022-10-29 ENCOUNTER — Ambulatory Visit: Payer: Medicare Other | Admitting: Cardiology

## 2022-10-29 VITALS — BP 126/53 | HR 72 | Resp 17 | Ht 66.0 in | Wt 145.0 lb

## 2022-10-29 DIAGNOSIS — I4819 Other persistent atrial fibrillation: Secondary | ICD-10-CM

## 2022-10-29 DIAGNOSIS — I350 Nonrheumatic aortic (valve) stenosis: Secondary | ICD-10-CM | POA: Diagnosis not present

## 2022-10-29 DIAGNOSIS — I25118 Atherosclerotic heart disease of native coronary artery with other forms of angina pectoris: Secondary | ICD-10-CM

## 2022-10-29 DIAGNOSIS — I5023 Acute on chronic systolic (congestive) heart failure: Secondary | ICD-10-CM | POA: Diagnosis not present

## 2022-10-29 DIAGNOSIS — I200 Unstable angina: Secondary | ICD-10-CM | POA: Diagnosis not present

## 2022-10-29 NOTE — Progress Notes (Signed)
Patient referred by Ginger Organ., MD for chest pain, shortness of breath  Subjective:   Ryan [name], male    DOB: 08-30-1941, 81 y.o.   MRN: [id number]   Chief Complaint  Patient presents with   Congestive Heart Failure   Follow-up    3 month       HPI  81 year old caucasian male with coronary artery disease status s/p CABG (patent LIMA-LAD, occluded SVG-ramus/OM Y graft), ischemic cardiomyopathy with recovered EF, s/p ICD, mod AS, persistent Afib, PAD s/o aortobifemoral bypass, mod carotid disease, h/o stroke, CKD III  Patient has recently had leg swelling and weight gain. He denies any significant worsening of dyspnea.    Current Outpatient Medications:    acetaminophen (TYLENOL) 325 MG tablet, Take 2 tablets (650 mg total) by mouth every 6 (six) hours as needed for mild pain (or Fever >/= 101)., Disp: 20 tablet, Rfl: 0   albuterol (VENTOLIN HFA) 108 (90 Base) MCG/ACT inhaler, Inhale 1 puff into the lungs as needed., Disp: , Rfl:    allopurinol (ZYLOPRIM) 100 MG tablet, Take 100 mg by mouth daily as needed (gout)., Disp: , Rfl:    ALPRAZolam (XANAX) 0.5 MG tablet, Take 0.5 mg by mouth 3 (three) times daily as needed for anxiety or sleep., Disp: , Rfl:    apixaban (ELIQUIS) 2.5 MG TABS tablet, Take 2.5 mg by mouth 2 (two) times daily., Disp: , Rfl:    bisoprolol (ZEBETA) 5 MG tablet, Take 0.5 tablets (2.5 mg total) by mouth daily., Disp: 1 tablet, Rfl: 0   Evolocumab (REPATHA SURECLICK) 144 MG/ML SOAJ, Inject 140 mg into the skin every 14 (fourteen) days., Disp: 2 mL, Rfl: 11   FARXIGA 10 MG TABS tablet, TAKE 1 TABLET BY MOUTH DAILY, Disp: 30 tablet, Rfl: 2   finasteride (PROSCAR) 5 MG tablet, Take 5 mg by mouth in the morning., Disp: , Rfl:    losartan (COZAAR) 25 MG tablet, Take 0.5 tablets (12.5 mg total) by mouth daily., Disp: 15 tablet, Rfl: 3   meclizine (ANTIVERT) 12.5 MG tablet, TAKE ONE TABLET BY MOUTH THREE TIMES A DAY AS NEEDED FOR DIZZINESS, Disp: 60  tablet, Rfl: 1   methocarbamol (ROBAXIN) 500 MG tablet, Take 500 mg by mouth daily as needed for muscle spasms (back pain.)., Disp: , Rfl:    metoCLOPramide (REGLAN) 5 MG tablet, Take 1 tablet (5 mg total) by mouth every 6 (six) hours as needed for nausea., Disp: 15 tablet, Rfl: 0   nitroGLYCERIN (NITROSTAT) 0.4 MG SL tablet, Place 1 tablet (0.4 mg total) under the tongue every 5 (five) minutes as needed for chest pain., Disp: 25 tablet, Rfl: 3   ondansetron (ZOFRAN) 4 MG tablet, Take 4 mg by mouth every 8 (eight) hours as needed for nausea or vomiting., Disp: , Rfl:    pantoprazole (PROTONIX) 40 MG tablet, Take 1 tablet (40 mg total) by mouth daily., Disp: 30 tablet, Rfl: 11   polyethylene glycol powder (GLYCOLAX/MIRALAX) 17 GM/SCOOP powder, Take 17 g by mouth in the morning and at bedtime., Disp: , Rfl:    ranolazine (RANEXA) 500 MG 12 hr tablet, TAKE 1 TABLET BY MOUTH TWICE A DAY, Disp: 120 tablet, Rfl: 0   tamsulosin (FLOMAX) 0.4 MG CAPS capsule, Take 0.4 mg by mouth in the morning., Disp: , Rfl:    tobramycin (TOBREX) 0.3 % ophthalmic solution, Place 1 drop into the left eye 2 (two) times daily., Disp: , Rfl:    torsemide (  DEMADEX) 20 MG tablet, Take 1 tablet (20 mg total) by mouth daily., Disp: 30 tablet, Rfl: 1  Cardiovascular and other pertinent studies:  EKG 10/29/2022: Atrial fibrillation 67 bpm Diffuse low voltage Nonspecific T-abnormality   Echocardiogram 12/27/2021:  1. Left ventricular ejection fraction, by estimation, is 50 to 55%. The  left ventricle has low normal function. The left ventricle demonstrates  regional wall motion abnormalities (see scoring diagram/findings for  description). Diastolic function not  assessed due to Afib. There is severe hypokinesis of the left ventricular,  basal anteroseptal wall.   2. Right ventricular systolic function is mildly reduced. The right  ventricular size is normal. There is mildly elevated pulmonary artery  systolic pressure.    3. Left atrial size was severely dilated.   4. Right atrial size was moderately dilated.   5. The mitral valve is grossly normal. Mild mitral valve regurgitation.   6. Mild tricuspid regurgitation. peak RA-RV gradient 33 mmHg. . Tricuspid  valve regurgitation is mild.   7. Vmax 2.3 m/sec, mean PG 13 mmHg. AVA by continuity equation is 0.9  cm2, with dimensionless index of 0.24. Small LV cavity with LV SV index of  20 cc/m2. Likely paradoxically low flow low gradient severe aortic  stenosis. . The aortic valve is  tricuspid. There is moderate calcification of the aortic valve.   8. Overall, LVEF assessment remains difficult. No significant change  compared to previous study in 08/2021.   Coronary and bypass graft angiography 01/2019: Severe native coronary artery disease with diffuse proximal to mid LAD stenosis with diffusely diseased native LAD proximally and mid with competitive filling from the LIMA graft; 90% proximal stenosis in the ramus immediate vessel, diffuse stenosis of 60 to 70% in the proximal circumflex with diffuse 80% distal stenosis and total occlusion of the AV groove after marginal vessel with total occlusion of the proximal native RCA.  There is extensive collateralization to the distal RCA both via the circumflex vessel as well as via the LIMA to LAD and distal LAD.   Patent LIMA graft supplying the mid LAD   Previously documented old occlusion of the previous Y graft which had supplied the ramus and marginal vessel in the vein graft which had supplied the PDA.   Low right heart pressures with mean PA pressure at 19 mmHg.   RECOMMENDATION: Increase medical therapy.  The patient has only been on low-dose anti-ischemic medical regimen consisting of bisoprolol 2.5 mg as well as isosorbide 60 mg.  Recommend initiation of amlodipine 5 mg.  Also recommend consideration of Ranexa 500 mg twice a day.  The patient is on rosuvastatin.  However if the patient is to be on atorvastatin,  the maximum recommended dose of atorvastatin is 40 mg if the patient is on ranolazine.  Recent labs: 06/03/2022: Glucose 101, BUN/Cr 15/1.3. EGFR 53. Na/K 134/4.4.  H/H 13/39. MCV 102.7. Platelets 352 HbA1C 5.4% Chol 133, TG 87, HDL 45, LDL 71 TSH 17.9 high. Free T4 0.8 low normal  05/12/2022: BNP 1067  Review of Systems  HENT:  Positive for tinnitus.   Cardiovascular:  Negative for chest pain, dyspnea on exertion, leg swelling, palpitations and syncope.  Respiratory:  Negative for shortness of breath.   Gastrointestinal:  Positive for constipation and nausea.  Genitourinary:        Self catheterization at home due to urinary retention  Neurological:  Positive for dizziness.         Vitals:   10/29/22 1444  BP: Marland Kitchen)  126/53  Pulse: 72  Resp: 17  SpO2: 97%    Orthostatic VS for the past 72 hrs (Last 3 readings):  Patient Position BP Location Cuff Size  10/29/22 1444 Sitting Left Arm Normal     Body mass index is 23.4 kg/m. Filed Weights   10/29/22 1444  Weight: 145 lb (65.8 kg)     Objective:   Physical Exam Vitals and nursing note reviewed.  Constitutional:      General: He is not in acute distress. Neck:     Vascular: JVD present.  Cardiovascular:     Rate and Rhythm: Normal rate. Rhythm irregular.     Pulses:          Dorsalis pedis pulses are 0 on the right side and 0 on the left side.       Posterior tibial pulses are 0 on the right side and 1+ on the left side.     Heart sounds: Murmur heard.     Harsh midsystolic murmur is present with a grade of 2/6 at the upper right sternal border radiating to the neck.  Pulmonary:     Effort: Pulmonary effort is normal.     Breath sounds: Normal breath sounds. No wheezing or rales.  Musculoskeletal:     Right lower leg: Edema (1+) present.     Left lower leg: Edema (1+) present.        Assessment & Recommendations:   81 y/o caucasian male with coronary artery disease status s/p CABG (patent LIMA-LAD,  occluded SVG-ramus/OM Y graft), ischemic cardiomyopathy with recovered EF, s/p ICD, mod AS, persistent Afib, PAD s/o aortobifemoral bypass, mod carotid disease, h/o stroke, CKD III, now with chest pain  HFrEF: Mild fluid overload. Increase torsemide to 20 mgX2 pills/day for next 4 days. Will check labs in 1 week. Recommend principal care management and remote patient monitoring for HFrEF. Continue losartan 12.5 mg daily, Farxiga 10 mg daily, bisoprolol 2.5 mg daily.He had hyperkalemia with Entresto before.   Persistent A. fib/atrial flutter: Longstanding.  Rate controlled. High CHA2DS2VAsc score Continue Eliquis 5 mg daily.   Given that he continues to be in A-fib, not on amiodarone.  H/o NSVT: No recent recurrence.  Discontinue amiodarone. Will monitor ICD.  CKD stage III: Stable  PAD: S/p aortobifem bypass.  No critical limb ischemia.   F/u in 4 weeks    Nigel Mormon, MD Pager: [phone] Office: [phone]

## 2022-11-11 ENCOUNTER — Other Ambulatory Visit: Payer: Self-pay

## 2022-11-11 DIAGNOSIS — T17900A Unspecified foreign body in respiratory tract, part unspecified causing asphyxiation, initial encounter: Secondary | ICD-10-CM | POA: Diagnosis not present

## 2022-11-11 DIAGNOSIS — K222 Esophageal obstruction: Secondary | ICD-10-CM | POA: Diagnosis not present

## 2022-11-13 ENCOUNTER — Telehealth (HOSPITAL_COMMUNITY): Payer: Self-pay

## 2022-11-13 NOTE — Telephone Encounter (Signed)
Attempted to contact spouse of patient to schedule Modified Barium Swallow - left voicemail.

## 2022-11-16 ENCOUNTER — Telehealth: Payer: Self-pay

## 2022-11-16 NOTE — Telephone Encounter (Signed)
Initial follow-up after starting home weight monitoring with RPM. Patient denies any increased SOB or edema. Patient said he did have a spell over the long weekend - he was bent over for several minutes dealing with a case of water then stood up feeling faint and dizzy; patient said he then sat down and was shocked by his ICD. See note from Dr. Tanna Furry office.  Average Weight Level 135.75 lbs Lowest Weight Level 134.4 lbs Highest Weight Level 137.4 lbs  11/16/2022 Monday at 06:51 AM 134.4      11/15/2022 'Sunday at 06:54 AM 135      11/14/2022 Saturday at 07:38 AM 134.4      11/13/2022 Friday at 06:58 AM 135.4      11/12/2022 Thursday at 06:41 AM 135.8      11/11/2022 Wednesday at 06:34 AM 136.8      11/11/2022 Wednesday at 06:34 AM 137.4      11'$ /21/2023 Tuesday at 07:17 AM 136.8

## 2022-11-16 NOTE — Telephone Encounter (Signed)
[employer] Alert.  Device alert for sustained VT with successful therapy. Events occurred 11/25 between 09:45-16:36. EGM show sustained VT @ 09:59 treated unsuccessfully with  ATPx8, followed by HV therapy 41J converting to regular VS There is one episode of sustained VT converted with ATP prior and two additional events @ 15:58 and 16:36 also converted with ATPx1.  HR's 189-200.    Patient reports during time of shock he was carrying a case of water up a flight of steps. States he was aware of shock although no symptoms prior. Patient reports doing well since and compliant with medications on file. Patient is overdue for in-clinic apt with provider. Advised patient will forward to scheduling. Patient request to call [phone] Letta Median) - Call wife for appointments per patient.  Shock plan reviewed/[place] DMV driving restrictions x6 months with verbal understanding.  Forwarding to Dr. Lovena Le per protocol.

## 2022-11-17 ENCOUNTER — Other Ambulatory Visit: Payer: Self-pay | Admitting: Cardiology

## 2022-11-17 DIAGNOSIS — Z5181 Encounter for therapeutic drug level monitoring: Secondary | ICD-10-CM

## 2022-11-17 DIAGNOSIS — I498 Other specified cardiac arrhythmias: Secondary | ICD-10-CM

## 2022-11-17 DIAGNOSIS — I4729 Other ventricular tachycardia: Secondary | ICD-10-CM

## 2022-11-17 MED ORDER — AMIODARONE HCL 200 MG PO TABS: 200.0000 mg | ORAL_TABLET | ORAL | 3 refills | Status: DC

## 2022-11-17 NOTE — Telephone Encounter (Signed)
Noted. Had discontinued his amiodarone.  Lets check CMP and TSH now, and resume amiodarone at 200 mg bid for one week, then down to 200 mg daily.  Thanks MJP

## 2022-11-18 NOTE — Telephone Encounter (Signed)
[employer] Alert Received.  Device alert Antitachycardia pacing (ATP) therapy delivered to convert arrhythmia. Event occurred 11/28 @ 14:48, EGM shows sustained VT, rate 195, ATP delivered x5 converting arrhythmia.  Patient called, reports he was walking during the time of therapy delivered but was not aware of ATP and denies any symptoms. Patient advised not to do anything strenuous until seen by Dr. Lovena Le. In-clinic apt changed to 11/19/22 @ 11:00 am.  Patient reports compliance with medications on file.   Patient advised of ED precautions of he receives a shock, chest pain, syncope or other concerning symptoms. Patient advised not to drive. Pt voiced understanding.

## 2022-11-18 NOTE — Telephone Encounter (Signed)
Pt contacted per Device note below. Pt will see Dr. Lovena Le on 11/19/2022 at 1100 am to address ATP / Shock.  Amiodarone changes already made to [facility].

## 2022-11-19 ENCOUNTER — Ambulatory Visit: Payer: Medicare Other | Attending: Internal Medicine | Admitting: Internal Medicine

## 2022-11-19 ENCOUNTER — Encounter: Payer: Self-pay | Admitting: Internal Medicine

## 2022-11-19 VITALS — BP 130/72 | HR 89 | Ht 66.0 in | Wt 142.2 lb

## 2022-11-19 DIAGNOSIS — I200 Unstable angina: Secondary | ICD-10-CM | POA: Diagnosis not present

## 2022-11-19 DIAGNOSIS — I472 Ventricular tachycardia, unspecified: Secondary | ICD-10-CM | POA: Diagnosis not present

## 2022-11-19 DIAGNOSIS — I4819 Other persistent atrial fibrillation: Secondary | ICD-10-CM | POA: Diagnosis not present

## 2022-11-19 DIAGNOSIS — Z95810 Presence of automatic (implantable) cardiac defibrillator: Secondary | ICD-10-CM

## 2022-11-19 DIAGNOSIS — I4729 Other ventricular tachycardia: Secondary | ICD-10-CM

## 2022-11-19 LAB — CUP PACEART INCLINIC DEVICE CHECK
Date Time Interrogation Session: 20231130125605
HighPow Impedance: 63 Ohm
HighPow Impedance: 69 Ohm
Implantable Lead Connection Status: 753985
Implantable Lead Implant Date: 20220110
Implantable Lead Location: 753860
Implantable Lead Model: 137
Implantable Lead Serial Number: 301176
Implantable Pulse Generator Implant Date: 20220110
Lead Channel Impedance Value: 601 Ohm
Lead Channel Pacing Threshold Amplitude: 0.7 V
Lead Channel Pacing Threshold Pulse Width: 0.4 ms
Lead Channel Sensing Intrinsic Amplitude: 14.8 mV
Lead Channel Setting Pacing Amplitude: 2.5 V
Lead Channel Setting Pacing Pulse Width: 0.4 ms
Lead Channel Setting Sensing Sensitivity: 0.5 mV
Pulse Gen Serial Number: 211464

## 2022-11-19 MED ORDER — AMIODARONE HCL 200 MG PO TABS: 200.0000 mg | ORAL_TABLET | Freq: Two times a day (BID) | ORAL | 7 refills | Status: DC

## 2022-11-19 NOTE — Patient Instructions (Addendum)
Medication Instructions:  Your physician has recommended you make the following change in your medication: Amiodarone Increase to TWICE a day.  Amiodarone:  You will:   Take 1 tablet (200 mg total) by mouth 2 (two) times daily. Until January 1st; Then, Take 1 tablet ( 200 mg total ) by mouth once daily, Monday through Friday; On Saturday and Sunday: Take 1 tablet ( 200 mg total ) by mouth 2 (two) times daily ONLY, until February 19, 2023    Lab Work: None ordered.  If you have labs (blood work) drawn today and your tests are completely normal, you will receive your results only by: Ryan [name] (if you have MyChart) OR A paper copy in the mail If you have any lab test that is abnormal or we need to change your treatment, we will call you to review the results.  Testing/Procedures: None ordered.  Follow-Up: At [facility], you and your health needs are our priority.  As part of our continuing mission to provide you with exceptional heart care, we have created designated Provider Care Teams.  These Care Teams include your primary Cardiologist (physician) and Advanced Practice Providers (APPs -  Physician Assistants and Nurse Practitioners) who all work together to provide you with the care you need, when you need it.  We recommend signing up for the patient portal called "MyChart".  Sign up information is provided on this After Visit Summary.  MyChart is used to connect with patients for Virtual Visits (Telemedicine).  Patients are able to view lab/test results, encounter notes, upcoming appointments, etc.  Non-urgent messages can be sent to your provider as well.   To learn more about what you can do with MyChart, go to [email or web address].    Your next appointment:   Please schedule a 6 month follow up appointment with Dr. Cristopher Peru   The format for your next appointment:   In Person  Provider:   Cristopher Peru, MD{or one of the following Advanced Practice Providers on your  designated Care Team:   [name], [name] "[name]" Chalmers Cater, [place]  Remote monitoring is used to monitor your ICD from home. This monitoring reduces the number of office visits required to check your device to one time per year. It allows [place] to keep an eye on the functioning of your device to ensure it is working properly. You are scheduled for a device check from home on 12/30/22. You may send your transmission at any time that day. If you have a wireless device, the transmission will be sent automatically. After your physician reviews your transmission, you will receive a postcard with your next transmission date.  Important Information About Sugar      Amiodarone Tablets What is this medication? AMIODARONE (a MEE oh da rone) prevents and treats a fast or irregular heartbeat (arrhythmia). It works by slowing down overactive electric signals in the heart, which stabilizes your heart rhythm. It belongs to a group of medications called antiarrhythmics. This medicine may be used for other purposes; ask your health care provider or pharmacist if you have questions. COMMON BRAND NAME(S): Cordarone, Pacerone What should I tell my care team before I take this medication? They need to know if you have any of these conditions: Liver disease Lung disease Other heart problems Thyroid disease An unusual or allergic reaction to amiodarone, iodine, other medications, foods, dyes, or preservatives Pregnant or trying to get pregnant Breast-feeding How should I use this medication? Take this medication by mouth with water.  Take it as directed on the prescription label at the same time every day. You can take it with or without food. You should always take it the same way. Keep taking it unless your care team tells you to stop. A special MedGuide will be given to you by the pharmacist with each prescription and refill. Be sure to read this information carefully each time. Talk to your care team about  the use of this medication in children. Special care may be needed. Overdosage: If you think you have taken too much of this medicine contact a poison control center or emergency room at once. NOTE: This medicine is only for you. Do not share this medicine with others. What if I miss a dose? If you miss a dose, take it as soon as you can. If it is almost time for your next dose, take only that dose. Do not take double or extra doses. What may interact with this medication? Do not take this medication with any of the following: Abarelix Apomorphine Arsenic trioxide Certain antibiotics, such as erythromycin, gemifloxacin, levofloxacin, or pentamidine Certain medications for depression, such as amoxapine or tricyclic antidepressants Certain medications for fungal infections, such as fluconazole, itraconazole, ketoconazole, posaconazole, or voriconazole Certain medications for irregular heartbeat, such as disopyramide, dronedarone, ibutilide, propafenone, or sotalol Certain medications for malaria, such as chloroquine or halofantrine Cisapride Droperidol Haloperidol [place] Maprotiline Methadone Phenothiazines, such as chlorpromazine, mesoridazine, or thioridazine Pimozide Ranolazine Red yeast rice Vardenafil This medication may also interact with the following: Antivirals for HIV Certain medications for blood pressure, heart disease, irregular heartbeat Certain medications for cholesterol, such as atorvastatin, cerivastatin, lovastatin, or simvastatin Certain medications for hepatitis C, such as sofosbuvir and ledipasvir; sofosbuvir Certain medications for seizures, such as phenytoin Certain medications for thyroid problems Certain medications that prevent or treat blood clots, such as warfarin Cholestyramine Cimetidine Clopidogrel Cyclosporine Dextromethorphan Diuretics Dofetilide Fentanyl General anesthetics Grapefruit juice Lidocaine Loratadine Methotrexate Other  medications that cause heart rhythm changes Procainamide Quinidine Rifabutin, rifampin, or rifapentine St. John's Wort Trazodone Ziprasidone This list may not describe all possible interactions. Give your health care provider a list of all the medicines, herbs, non-prescription drugs, or dietary supplements you use. Also tell them if you smoke, drink alcohol, or use illegal drugs. Some items may interact with your medicine. What should I watch for while using this medication? Your condition will be monitored closely when you first begin therapy. This medication is often started in a hospital or other monitored health care setting. Once you are on maintenance therapy, visit your care team for regular checks on your progress. Because your condition and use of this medication carry some risk, it is a good idea to carry an identification card, necklace, or bracelet with details of your condition, medications, and care team. This medication may affect your coordination, reaction time, or judgment. Do not drive or operate machinery until you know how this medication affects you. Sit up or stand slowly to reduce the risk of dizzy or fainting spells. Drinking alcohol with this medication can increase the risk of these side effects. This medication can make you more sensitive to the sun. Keep out of the sun. If you cannot avoid being in the sun, wear protective clothing and sunscreen. Do not use sun lamps, tanning beds, or tanning booths. You should have regular eye exams before and during treatment. Call your care team if you have blurred vision, see halos, or your eyes become sensitive to light. Your eyes may  get dry. It may be helpful to use a lubricating eye solution or artificial tears solution. If you are going to have surgery or a procedure that requires contrast dyes, tell your care team that you are taking this medication. What side effects may I notice from receiving this medication? Side effects  that you should report to your care team as soon as possible: Allergic reactions--skin rash, itching, hives, swelling of the face, lips, tongue, or throat Bluish-gray skin Change in vision such as blurry vision, seeing halos around lights, vision loss Heart failure--shortness of breath, swelling of the ankles, feet, or hands, sudden weight gain, unusual weakness or fatigue Heart rhythm changes--fast or irregular heartbeat, dizziness, feeling faint or lightheaded, chest pain, trouble breathing High thyroid levels (hyperthyroidism)--fast or irregular heartbeat, weight loss, excessive sweating or sensitivity to heat, tremors or shaking, anxiety, nervousness, irregular menstrual cycle or spotting Liver injury--right upper belly pain, loss of appetite, nausea, light-colored stool, dark yellow or brown urine, yellowing skin or eyes, unusual weakness or fatigue Low thyroid levels (hypothyroidism)--unusual weakness or fatigue, sensitivity to cold, constipation, hair loss, dry skin, weight gain, feelings of depression Lung injury--shortness of breath or trouble breathing, cough, spitting up blood, chest pain, fever Pain, tingling, or numbness in the hands or feet, muscle weakness, trouble walking, loss of balance or coordination Side effects that usually do not require medical attention (report to your care team if they continue or are bothersome): Nausea Vomiting This list may not describe all possible side effects. Call your doctor for medical advice about side effects. You may report side effects to FDA at 1-800-FDA-1088. Where should I keep my medication? Keep out of the reach of children and pets. Store at room temperature between 20 and 25 degrees C (68 and 77 degrees F). Protect from light. Keep container tightly closed. Throw away any unused medication after the expiration date. NOTE: This sheet is a summary. It may not cover all possible information. If you have questions about this medicine, talk  to your doctor, pharmacist, or health care provider.  2023 [employer]vier/[employer] (2021-01-31 00:00:00)

## 2022-11-19 NOTE — Progress Notes (Signed)
HPI Mr. Rando returns today for followup. He is a pleasant 81 yo man with a h/o LV dysfunction and VT who presented with VT back in January 2022 and underwent ICD implantation at that time. He has known CAD and is s/p MI remotely. Since his ICD insertion he notes class 2 CHF symptoms. He has had an uptick in his ICD therapies and has increased his dose of amiodarone. He denies chest pain or sob.  Allergies  Allergen Reactions   Jardiance [Empagliflozin] Nausea Only and Other (See Comments)    Made patient very sick to the stomach.   Ezetimibe Other (See Comments)    Myalgias    Ace Inhibitors Cough    Other reaction(s): cough   Aspirin Other (See Comments)    skin rash/easy bruising   Atorvastatin Other (See Comments)    weakness, fatigue (severe)   Ezetimibe-Simvastatin Other (See Comments)    myalgias   Rosuvastatin Other (See Comments)    Myalgias/weakness   Codeine Nausea And Vomiting         Unithroid [Levothyroxine Sodium] Other (See Comments)    Caused blurry vision per pt     Current Outpatient Medications  Medication Sig Dispense Refill   acetaminophen (TYLENOL) 325 MG tablet Take 2 tablets (650 mg total) by mouth every 6 (six) hours as needed for mild pain (or Fever >/= 101). 20 tablet 0   albuterol (VENTOLIN HFA) 108 (90 Base) MCG/ACT inhaler Inhale 1 puff into the lungs as needed.     allopurinol (ZYLOPRIM) 100 MG tablet Take 100 mg by mouth daily as needed (gout).     ALPRAZolam (XANAX) 0.5 MG tablet Take 0.5 mg by mouth 3 (three) times daily as needed for anxiety or sleep.     amiodarone (PACERONE) 200 MG tablet Take 1 tablet (200 mg total) by mouth as directed. 1 pill twice daily for 1 week starting 11/17/2022, then 1 pill daily. 90 tablet 3   apixaban (ELIQUIS) 2.5 MG TABS tablet Take 2.5 mg by mouth 2 (two) times daily.     bisoprolol (ZEBETA) 5 MG tablet Take 0.5 tablets (2.5 mg total) by mouth daily. 1 tablet 0   Evolocumab (REPATHA SURECLICK) 062  MG/ML SOAJ Inject 140 mg into the skin every 14 (fourteen) days. 2 mL 11   FARXIGA 10 MG TABS tablet TAKE 1 TABLET BY MOUTH DAILY 30 tablet 2   finasteride (PROSCAR) 5 MG tablet Take 5 mg by mouth in the morning.     meclizine (ANTIVERT) 12.5 MG tablet TAKE ONE TABLET BY MOUTH THREE TIMES A DAY AS NEEDED FOR DIZZINESS 60 tablet 1   methocarbamol (ROBAXIN) 500 MG tablet Take 500 mg by mouth daily as needed for muscle spasms (back pain.).     metoCLOPramide (REGLAN) 5 MG tablet Take 1 tablet (5 mg total) by mouth every 6 (six) hours as needed for nausea. 15 tablet 0   nitroGLYCERIN (NITROSTAT) 0.4 MG SL tablet Place 1 tablet (0.4 mg total) under the tongue every 5 (five) minutes as needed for chest pain. 25 tablet 3   ondansetron (ZOFRAN) 4 MG tablet Take 4 mg by mouth every 8 (eight) hours as needed for nausea or vomiting.     pantoprazole (PROTONIX) 40 MG tablet Take 1 tablet (40 mg total) by mouth daily. 30 tablet 11   polyethylene glycol powder (GLYCOLAX/MIRALAX) 17 GM/SCOOP powder Take 17 g by mouth in the morning and at bedtime.     ranolazine (RANEXA)  500 MG 12 hr tablet TAKE 1 TABLET BY MOUTH TWICE A DAY 120 tablet 0   tamsulosin (FLOMAX) 0.4 MG CAPS capsule Take 0.4 mg by mouth in the morning.     tobramycin (TOBREX) 0.3 % ophthalmic solution Place 1 drop into the left eye 2 (two) times daily.     torsemide (DEMADEX) 20 MG tablet Take 1 tablet (20 mg total) by mouth daily. 30 tablet 1   losartan (COZAAR) 25 MG tablet Take 0.5 tablets (12.5 mg total) by mouth daily. 15 tablet 3   No current facility-administered medications for this visit.     Past Medical History:  Diagnosis Date   Acquired thrombophilia ([place]) 11/01/2021   Arthritis    CAD (coronary artery disease)    Carotid artery occlusion    Cataract    Bil eyes/worse in left eye   CHF (congestive heart failure) (HCC)    Chronic back pain    COVID-19 10/31/2021   DVT (deep venous thrombosis) (HCC)    Dysrhythmia     Enlarged prostate    takes Rapaflo daily   GERD (gastroesophageal reflux disease)    occasional   History of colon polyps    History of gout    has colchicine prn   History of kidney stones    Hyperlipidemia    takes Crestor daily   Hypertension    takes Amlodipine daily   Hypothyroidism    Myocardial infarction [facility])    Peripheral vascular disease (HCC)    Prolonged QT interval 11/01/2021   Pulmonary emboli ([place]) 03/20/2015   elevated d-dimer, intermediate V/Q study, atypical chest pain and SOB. Start on Xarelto '20mg'$  BID for 3 month   Rapid atrial fibrillation (HCC)    Renal insufficiency    Shortness of breath dyspnea    Urinary frequency    Urinary urgency     ROS:   All systems reviewed and negative except as noted in the HPI.   Past Surgical History:  Procedure Laterality Date   APPENDECTOMY     BACK SURGERY     5 times   big toe surgery     CARDIAC CATHETERIZATION     2010    dr Acie Fredrickson   cataract surgery     left eye   CHOLECYSTECTOMY N/A 07/27/2016   Procedure: LAPAROSCOPIC CHOLECYSTECTOMY;  Surgeon: Mickeal Skinner, MD;  Location: [place];  Service: General;  Laterality: N/A;   COLONOSCOPY     CORONARY ARTERY BYPASS GRAFT N/A 04/05/2015   Procedure: CORONARY ARTERY BYPASS GRAFTING (CABG)X4 LIMA-LAD; SVG-DIAG1-DIAG2; SVG-PD;  Surgeon: [name], MD;  Location: [place];  Service: Open Heart Surgery;  Laterality: N/A;   CORONARY BALLOON ANGIOPLASTY N/A 04/14/2022   Procedure: CORONARY BALLOON ANGIOPLASTY;  Surgeon: Nigel Mormon, MD;  Location: [place] [facility];  Service: Cardiovascular;  Laterality: N/A;   CORONARY/GRAFT ANGIOGRAPHY N/A 04/22/2018   Procedure: CORONARY/GRAFT ANGIOGRAPHY;  Surgeon: Martinique, Peter M, MD;  Location: [place] [facility];  Service: Cardiovascular;  Laterality: N/A;   CYSTOSCOPY     ENDARTERECTOMY Left 04/24/2016   Procedure: ENDARTERECTOMY LEFT CAROTID;  Surgeon: Mal Misty, MD;  Location: [place];  Service: Vascular;   Laterality: Left;   EYE SURGERY     FEMORAL ARTERY - POPLITEAL ARTERY BYPASS GRAFT     ICD IMPLANT N/A 12/30/2020   Procedure: ICD IMPLANT;  Surgeon: Evans Lance, MD;  Location: [place] [facility];  Service: Cardiovascular;  Laterality: N/A;   INTRAVASCULAR  ULTRASOUND/IVUS N/A 04/14/2022   Procedure: Intravascular Ultrasound/IVUS;  Surgeon: Nigel Mormon, MD;  Location: [place] [facility];  Service: Cardiovascular;  Laterality: N/A;   JOINT REPLACEMENT     shoulder   LEFT HEART CATH N/A 04/14/2022   Procedure: Left Heart Cath;  Surgeon: Nigel Mormon, MD;  Location: [place] [facility];  Service: Cardiovascular;  Laterality: N/A;   LEFT HEART CATH AND CORONARY ANGIOGRAPHY N/A 04/24/2022   Procedure: LEFT HEART CATH AND CORONARY ANGIOGRAPHY;  Surgeon: Adrian Prows, MD;  Location: [place] [facility];  Service: Cardiovascular;  Laterality: N/A;   LEFT HEART CATHETERIZATION WITH CORONARY ANGIOGRAM N/A 04/03/2015   Procedure: LEFT HEART CATHETERIZATION WITH CORONARY ANGIOGRAM;  Surgeon: Troy Sine, MD;  Location[facility];  Service: Cardiovascular;  Laterality: N/A;   LUMBAR LAMINECTOMY  01/06/2013   Procedure: MICRODISCECTOMY LUMBAR LAMINECTOMY;  Surgeon: Marybelle Killings, MD;  Location: [place];  Service: Orthopedics;  Laterality: N/A;  L3-4 decompression   LUMBAR LAMINECTOMY/DECOMPRESSION MICRODISCECTOMY  02/12/2012   Procedure: LUMBAR LAMINECTOMY/DECOMPRESSION MICRODISCECTOMY;  Surgeon: Floyce Stakes, MD;  Location: [place] [facility];  Service: Neurosurgery;  Laterality: N/A;  Lumbar four-five laminectomy   PATCH ANGIOPLASTY Left 04/24/2016   Procedure: LEFT CAROTID ARTERY PATCH ANGIOPLASTY;  Surgeon: Mal Misty, MD;  Location: [place];  Service: Vascular;  Laterality: Left;   RIGHT HEART CATH AND CORONARY/GRAFT ANGIOGRAPHY N/A 01/30/2019   Procedure: RIGHT HEART CATH AND CORONARY/GRAFT ANGIOGRAPHY;  Surgeon: Troy Sine, MD;  Location: [place] [facility];  Service: Cardiovascular;   Laterality: N/A;   RIGHT/LEFT HEART CATH AND CORONARY ANGIOGRAPHY N/A 03/17/2022   Procedure: RIGHT/LEFT HEART CATH AND CORONARY ANGIOGRAPHY;  Surgeon: Nigel Mormon, MD;  Location: [place] [facility];  Service: Cardiovascular;  Laterality: N/A;   STERIOD INJECTION Right 01/09/2014   Procedure: STEROID INJECTION;  Surgeon: Mcarthur Rossetti, MD;  Location: [place];  Service: Orthopedics;  Laterality: Right;   TEE WITHOUT CARDIOVERSION N/A 04/05/2015   Procedure: TRANSESOPHAGEAL ECHOCARDIOGRAM (TEE);  Surgeon: [name], MD;  Location: [place];  Service: Open Heart Surgery;  Laterality: N/A;   TOTAL HIP ARTHROPLASTY Left 01/09/2014   DR Ninfa Linden   TOTAL HIP ARTHROPLASTY Left 01/09/2014   Procedure: LEFT TOTAL HIP ARTHROPLASTY ANTERIOR APPROACH and Steroid Injection Right hip;  Surgeon: Mcarthur Rossetti, MD;  Location: [place];  Service: Orthopedics;  Laterality: Left;   TOTAL HIP ARTHROPLASTY Right 08/15/2019   TOTAL HIP ARTHROPLASTY Right 08/15/2019   Procedure: RIGHT TOTAL HIP ARTHROPLASTY ANTERIOR APPROACH;  Surgeon: Mcarthur Rossetti, MD;  Location: [place];  Service: Orthopedics;  Laterality: Right;     Family History  Problem Relation Age of Onset   Heart disease Father    Heart attack Father    Heart disease Sister    Hypertension Sister    Heart attack Sister    Hypertension Mother    Diabetes Son      Social History   Socioeconomic History   Marital status: Married    Spouse name: Islam Eichinger   Number of children: 2   Years of education: Not on file   Highest education level: 9th grade  Occupational History   Not on file  Tobacco Use   Smoking status: Former    Packs/day: 1.00    Years: 25.00    Total pack years: 25.00    Types: Cigarettes    Quit date: 02/04/1987    Years since quitting: 25.8  Smokeless tobacco: Former    Types: Chew    Quit date: 07/20/2009   Tobacco comments:    quit 35+yrs ago  Vaping Use   Vaping Use: Never used   Substance and Sexual Activity   Alcohol use: No    Alcohol/week: 0.0 standard drinks of alcohol   Drug use: No   Sexual activity: Not Currently  Other Topics Concern   Not on file  Social History Narrative   Not on file   Social Determinants of Health   Financial Resource Strain: Low Risk  (08/28/2021)   Overall Financial Resource Strain (CARDIA)    Difficulty of Paying Living Expenses: Not hard at all  Food Insecurity: No Food Insecurity (07/08/2022)   Hunger Vital Sign    Worried About Running Out of Food in the Last Year: Never true    Ran Out of Food in the Last Year: Never true  Transportation Needs: No Transportation Needs (07/08/2022)   PRAPARE - Hydrologist (Medical): No    Lack of Transportation (Non-Medical): No  Physical Activity: Not on file  Stress: Not on file  Social Connections: Not on file  Intimate Partner Violence: Not At Risk (07/08/2022)   Humiliation, Afraid, Rape, and Kick questionnaire    Fear of Current or Ex-Partner: No    Emotionally Abused: No    Physically Abused: No    Sexually Abused: No     BP 130/72   Pulse 89   Ht '5\' 6"'$  (1.676 m)   Wt 142 lb 3.2 oz (64.5 kg)   SpO2 96%   BMI 22.95 kg/m   Physical Exam:  Well appearing NAD HEENT: Unremarkable Neck:  No JVD, no thyromegally Lymphatics:  No adenopathy Back:  No CVA tenderness Lungs:  Clear HEART:  Regular rate rhythm, no murmurs, no rubs, no clicks Abd:  soft, positive bowel sounds, no organomegally, no rebound, no guarding Ext:  2 plus pulses, no edema, no cyanosis, no clubbing Skin:  No rashes no nodules Neuro:  CN II through XII intact, motor grossly intact  EKG - afib with a CVR  DEVICE  Normal device function.  See PaceArt for details.   Assess/Plan: 1. VT - he has had an uptick and will increase his dose of amio. He will continue dose of amio at 200 mg bid until 12/21/22 and then take 200 mg daily, M-F and 400 mg s/s. I will see him back in 6  months. 2. ICD - his [employer] ICD has 13 years of battery longevity. We will follow. 3. ICM - he denies anginal symptoms and he will continue with GDMT 4. PAF  - he is out of rhythm but appears asymptomatic. If he develops symptoms would consider DCCV.   Carleene Overlie Tonjia Parillo,MD

## 2022-11-20 NOTE — Addendum Note (Signed)
Addended by: Gwendlyn Deutscher on: 11/20/2022 03:34 PM   Modules accepted: Orders

## 2022-11-20 NOTE — Telephone Encounter (Signed)
Ok

## 2022-11-23 DIAGNOSIS — I4729 Other ventricular tachycardia: Secondary | ICD-10-CM | POA: Diagnosis not present

## 2022-11-23 DIAGNOSIS — I498 Other specified cardiac arrhythmias: Secondary | ICD-10-CM | POA: Diagnosis not present

## 2022-11-23 DIAGNOSIS — Z5181 Encounter for therapeutic drug level monitoring: Secondary | ICD-10-CM | POA: Diagnosis not present

## 2022-11-24 LAB — COMPREHENSIVE METABOLIC PANEL
ALT: 10 IU/L (ref 0–44)
AST: 19 IU/L (ref 0–40)
Albumin/Globulin Ratio: 1.4 (ref 1.2–2.2)
Albumin: 3.9 g/dL (ref 3.7–4.7)
Alkaline Phosphatase: 77 IU/L (ref 44–121)
BUN/Creatinine Ratio: 17 (ref 10–24)
BUN: 25 mg/dL (ref 8–27)
Bilirubin Total: 0.7 mg/dL (ref 0.0–1.2)
CO2: 23 mmol/L (ref 20–29)
Calcium: 9.4 mg/dL (ref 8.6–10.2)
Chloride: 100 mmol/L (ref 96–106)
Creatinine, Ser: 1.46 mg/dL — ABNORMAL HIGH (ref 0.76–1.27)
Globulin, Total: 2.7 g/dL (ref 1.5–4.5)
Glucose: 118 mg/dL — ABNORMAL HIGH (ref 70–99)
Potassium: 5 mmol/L (ref 3.5–5.2)
Sodium: 141 mmol/L (ref 134–144)
Total Protein: 6.6 g/dL (ref 6.0–8.5)
eGFR: 48 mL/min/{1.73_m2} — ABNORMAL LOW (ref >59)

## 2022-11-24 LAB — TSH: TSH: 21.6 u[IU]/mL — ABNORMAL HIGH (ref 0.450–4.500)

## 2022-11-24 NOTE — Progress Notes (Signed)
Thank you for seeing him. Cr is up and TSH is quite up from before. I saw he was in Afib when he saw you but looks like he has been in rate controlled Afib for a while. Shall we monitor and repeat TSH in a month?  Appreciate your input.

## 2022-11-25 DIAGNOSIS — I5032 Chronic diastolic (congestive) heart failure: Secondary | ICD-10-CM | POA: Diagnosis not present

## 2022-11-30 ENCOUNTER — Encounter: Payer: Medicare Other | Admitting: Internal Medicine

## 2022-12-02 ENCOUNTER — Ambulatory Visit: Payer: Medicare Other | Admitting: Cardiology

## 2022-12-02 ENCOUNTER — Encounter: Payer: Self-pay | Admitting: Cardiology

## 2022-12-02 ENCOUNTER — Telehealth: Payer: Self-pay

## 2022-12-02 VITALS — BP 101/62 | HR 63 | Resp 16 | Ht 66.0 in | Wt 142.4 lb

## 2022-12-02 DIAGNOSIS — E039 Hypothyroidism, unspecified: Secondary | ICD-10-CM

## 2022-12-02 DIAGNOSIS — I472 Ventricular tachycardia, unspecified: Secondary | ICD-10-CM | POA: Diagnosis not present

## 2022-12-02 DIAGNOSIS — I200 Unstable angina: Secondary | ICD-10-CM | POA: Diagnosis not present

## 2022-12-02 DIAGNOSIS — R7989 Other specified abnormal findings of blood chemistry: Secondary | ICD-10-CM | POA: Diagnosis not present

## 2022-12-02 MED ORDER — RANOLAZINE ER 1000 MG PO TB12: 1000.0000 mg | ORAL_TABLET | Freq: Two times a day (BID) | ORAL | 3 refills | Status: DC

## 2022-12-02 NOTE — Progress Notes (Signed)
Patient referred by Ginger Organ., MD for chest pain, shortness of breath  Subjective:   Ryan [name], male    DOB: 07-09-1941, 81 y.o.   MRN: [id number]   Chief Complaint  Patient presents with   Acute on chronic systolic heart failure   Follow-up      HPI  81 year old caucasian male with coronary artery disease status s/p CABG (patent LIMA-LAD, occluded SVG-ramus/OM Y graft), ischemic cardiomyopathy with recovered EF, s/p ICD, mod AS, persistent Afib, PAD s/o aortobifemoral bypass, mod carotid disease, h/o stroke, CKD III  Patient recently had an approproate shck for VT. He was seen by Dr. Lovena Le, who recommended amiodarone 200 mg bid until 12/21/22 and then take 200 mg daily, M-F and 400 mg s/s.   He has occasional angina symptoms but able to walk 2-3 times a day. He continues to have persistent dizziness.   Reviewed recent test results with the patient, details below.    Patient's home weight has been stable around his baseline.   Average Weight Level 136.68 lbs Lowest Weight Level 135 lbs Highest Weight Level 138.2 lbs   12/02/2022 Wednesday at 07:34 AM 136           12/01/2022 Tuesday at 07:28 AM      136.2        11/30/2022 Monday at 07:21 AM       136.2        11/29/2022 _0 7.4   Current Outpatient Medications:    acetaminophen (TYLENOL) 325 MG tablet, Take 2 tablets (650 mg total) by mouth every 6 (six) hours as needed for mild pain (or Fever >/= 101)., Disp: 20 tablet, Rfl: 0   albuterol (VENTOLIN HFA) 108 (90 Base) MCG/ACT inhaler, Inhale 1 puff into the lungs as needed., Disp: ,  Rfl:    allopurinol (ZYLOPRIM) 100 MG tablet, Take 100 mg by mouth daily as needed (gout)., Disp: , Rfl:    ALPRAZolam (XANAX) 0.5 MG tablet, Take 0.5 mg by mouth 3 (three) times daily as needed for anxiety or sleep., Disp: , Rfl:    amiodarone (PACERONE) 200 MG tablet, Take 1 tablet (200 mg total) by mouth 2 (two) times daily. Until January 1st;  Then, Take 1 tablet ( 200 mg total ) by mouth once daily, Monday through Friday;  On Saturday and Sunday: Take 1 tablet ( 200 mg total ) by mouth 2 (two) times daily ONLY, until February 19, 2023., Disp: 90 tablet, Rfl: 7   apixaban (ELIQUIS) 2.5 MG TABS tablet, Take 2.5 mg by mouth 2 (two) times daily., Disp: , Rfl:    bisoprolol (  ZEBETA) 5 MG tablet, Take 0.5 tablets (2.5 mg total) by mouth daily., Disp: 1 tablet, Rfl: 0   FARXIGA 10 MG TABS tablet, TAKE 1 TABLET BY MOUTH DAILY, Disp: 30 tablet, Rfl: 2   finasteride (PROSCAR) 5 MG tablet, Take 5 mg by mouth in the morning., Disp: , Rfl:    losartan (COZAAR) 25 MG tablet, Take 0.5 tablets (12.5 mg total) by mouth daily., Disp: 15 tablet, Rfl: 3   meclizine (ANTIVERT) 12.5 MG tablet, TAKE ONE TABLET BY MOUTH THREE TIMES A DAY AS NEEDED FOR DIZZINESS, Disp: 60 tablet, Rfl: 1   methocarbamol (ROBAXIN) 500 MG tablet, Take 500 mg by mouth daily as needed for muscle spasms (back pain.)., Disp: , Rfl:    metoCLOPramide (REGLAN) 5 MG tablet, Take 1 tablet (5 mg total) by mouth every 6 (six) hours as needed for nausea., Disp: 15 tablet, Rfl: 0   nitroGLYCERIN (NITROSTAT) 0.4 MG SL tablet, Place 1 tablet (0.4 mg total) under the tongue every 5 (five) minutes as needed for chest pain., Disp: 25 tablet, Rfl: 3   ondansetron (ZOFRAN) 4 MG tablet, Take 4 mg by mouth every 8 (eight) hours as needed for nausea or vomiting., Disp: , Rfl:    pantoprazole (PROTONIX) 40 MG tablet, Take 1 tablet (40 mg total) by mouth daily., Disp: 30 tablet, Rfl: 11   polyethylene glycol powder (GLYCOLAX/MIRALAX) 17 GM/SCOOP powder, Take 17 g by  mouth in the morning and at bedtime., Disp: , Rfl:    ranolazine (RANEXA) 500 MG 12 hr tablet, TAKE 1 TABLET BY MOUTH TWICE A DAY, Disp: 120 tablet, Rfl: 0   tamsulosin (FLOMAX) 0.4 MG CAPS capsule, Take 0.4 mg by mouth in the morning., Disp: , Rfl:    tobramycin (TOBREX) 0.3 % ophthalmic solution, Place 1 drop into the left eye 2 (two) times daily., Disp: , Rfl:    torsemide (DEMADEX) 20 MG tablet, Take 1 tablet (20 mg total) by mouth daily., Disp: 30 tablet, Rfl: 1   Evolocumab (REPATHA SURECLICK) 474 MG/ML SOAJ, Inject 140 mg into the skin every 14 (fourteen) days., Disp: 2 mL, Rfl: 11  Cardiovascular and other pertinent studies:  EKG 10/29/2022: Atrial fibrillation 67 bpm Diffuse low voltage Nonspecific T-abnormality   Echocardiogram 12/27/2021:  1. Left ventricular ejection fraction, by estimation, is 50 to 55%. The  left ventricle has low normal function. The left ventricle demonstrates  regional wall motion abnormalities (see scoring diagram/findings for  description). Diastolic function not  assessed due to Afib. There is severe hypokinesis of the left ventricular,  basal anteroseptal wall.   2. Right ventricular systolic function is mildly reduced. The right  ventricular size is normal. There is mildly elevated pulmonary artery  systolic pressure.   3. Left atrial size was severely dilated.   4. Right atrial size was moderately dilated.   5. The mitral valve is grossly normal. Mild mitral valve regurgitation.   6. Mild tricuspid regurgitation. peak RA-RV gradient 33 mmHg. . Tricuspid  valve regurgitation is mild.   7. Vmax 2.3 m/sec, mean PG 13 mmHg. AVA by continuity equation is 0.9  cm2, with dimensionless index of 0.24. Small LV cavity with LV SV index of  20 cc/m2. Likely paradoxically low flow low gradient severe aortic  stenosis. . The aortic valve is  tricuspid. There is moderate calcification of the aortic valve.   8. Overall, LVEF assessment remains difficult. No  significant change  compared to previous study in 08/2021.   Coronary and bypass  graft angiography 01/2019: Severe native coronary artery disease with diffuse proximal to mid LAD stenosis with diffusely diseased native LAD proximally and mid with competitive filling from the LIMA graft; 90% proximal stenosis in the ramus immediate vessel, diffuse stenosis of 60 to 70% in the proximal circumflex with diffuse 80% distal stenosis and total occlusion of the AV groove after marginal vessel with total occlusion of the proximal native RCA.  There is extensive collateralization to the distal RCA both via the circumflex vessel as well as via the LIMA to LAD and distal LAD.   Patent LIMA graft supplying the mid LAD   Previously documented old occlusion of the previous Y graft which had supplied the ramus and marginal vessel in the vein graft which had supplied the PDA.   Low right heart pressures with mean PA pressure at 19 mmHg.   RECOMMENDATION: Increase medical therapy.  The patient has only been on low-dose anti-ischemic medical regimen consisting of bisoprolol 2.5 mg as well as isosorbide 60 mg.  Recommend initiation of amlodipine 5 mg.  Also recommend consideration of Ranexa 500 mg twice a day.  The patient is on rosuvastatin.  However if the patient is to be on atorvastatin, the maximum recommended dose of atorvastatin is 40 mg if the patient is on ranolazine.  Recent labs: 11/23/2022: Glucose 118, BUN/Cr 25/1.46. EGFR 48. Na/K 141/5.0. Rest of the CMP normal TSH 21 high  06/03/2022: Glucose 101, BUN/Cr 15/1.3. EGFR 53. Na/K 134/4.4.  H/H 13/39. MCV 102.7. Platelets 352 HbA1C 5.4% Chol 133, TG 87, HDL 45, LDL 71 TSH 17.9 high. Free T4 0.8 low normal  05/12/2022: BNP 1067  Review of Systems  HENT:  Positive for tinnitus.   Cardiovascular:  Negative for chest pain, dyspnea on exertion, leg swelling, palpitations and syncope.  Respiratory:  Negative for shortness of breath.   Gastrointestinal:   Positive for constipation and nausea.  Genitourinary:        Self catheterization at home due to urinary retention  Neurological:  Positive for dizziness.         Vitals:   12/02/22 1451  BP: 101/62  Pulse: 63  Resp: 16  SpO2: 97%    Orthostatic VS for the past 72 hrs (Last 3 readings):  Orthostatic BP Patient Position BP Location Cuff Size Orthostatic Pulse  12/02/22 1501 117/59 Standing Left Arm Normal 58  12/02/22 1500 120/59 Sitting Left Arm Normal 63  12/02/22 1459 130/62 Supine Left Arm Normal 66     Body mass index is 22.98 kg/m. Filed Weights   12/02/22 1451  Weight: 142 lb 6.4 oz (64.6 kg)     Objective:   Physical Exam Vitals and nursing note reviewed.  Constitutional:      General: He is not in acute distress. Neck:     Vascular: JVD present.  Cardiovascular:     Rate and Rhythm: Normal rate. Rhythm irregular.     Pulses:          Dorsalis pedis pulses are 0 on the right side and 0 on the left side.       Posterior tibial pulses are 0 on the right side and 1+ on the left side.     Heart sounds: Murmur heard.     Harsh midsystolic murmur is present with a grade of 2/6 at the upper right sternal border radiating to the neck.  Pulmonary:     Effort: Pulmonary effort is normal.     Breath sounds: Normal breath sounds.  No wheezing or rales.  Musculoskeletal:     Right lower leg: Edema (1+) present.     Left lower leg: Edema (1+) present.        Assessment & Recommendations:   81 y/o caucasian male with coronary artery disease status s/p CABG (patent LIMA-LAD, occluded SVG-ramus/OM Y graft), ischemic cardiomyopathy with recovered EF, s/p ICD, mod AS, persistent Afib, PAD s/o aortobifemoral bypass, mod carotid disease, h/o stroke, CKD III, now with chest pain  HFrEF: Euvolemic.  Continue principal care management and remote patient monitoring for HFrEF. Continue losartan 12.5 mg daily, Farxiga 10 mg daily, bisoprolol 2.5 mg daily.He had  hyperkalemia with Entresto before.   NSVT: Recent recurrence. On amiodarone as per Dr. Tanna Furry recommendations.  He has had recent increase in TSH to 21. I will check with Dr. Lovena Le re: this.  CAD: I do not think he has any targets for percutaneous intervention. Optimize medical management. Increase Ranexa to 1000 mg bid.   Persistent A. fib/atrial flutter: Longstanding.  Rate controlled. High CHA2DS2VAsc score Continue Eliquis 5 mg daily.    CKD stage III: Stable  PAD: S/p aortobifem bypass.  No critical limb ischemia.   Labs (CMP, TSH) in 4 weeks, f/u in 6 weeks     Nigel Mormon, MD Pager: [phone] Office: [phone]

## 2022-12-02 NOTE — Telephone Encounter (Signed)
Patient's home weight has been stable around his baseline.  Average Weight Level 136.68 lbs Lowest Weight Level 135 lbs Highest Weight Level 138.2 lbs  12/02/2022 Wednesday at 07:34 AM 136      12/01/2022 Tuesday at 07:28 AM 136.2      11/30/2022 Monday at 07:21 AM 136.2      11/29/2022 'Sunday at 07:16 AM 135      11/28/2022 Saturday at 07:21 AM 137      11/27/2022 Friday at 07:17 AM 136.8      11/26/2022 Thursday at 07:26 AM 136.2      11/25/2022 Wednesday at 07:33 AM 136.8      11/24/2022 Tuesday at 07:04 AM 137      11/23/2022 Monday at 07:28 AM 137.8      12'$ /02/2022 Sunday at 07:25 AM 137.4

## 2022-12-09 ENCOUNTER — Telehealth: Payer: Self-pay | Admitting: Cardiology

## 2022-12-09 DIAGNOSIS — I472 Ventricular tachycardia, unspecified: Secondary | ICD-10-CM

## 2022-12-09 NOTE — Telephone Encounter (Signed)
[name],  Our mutual patient's TSH has gone up to 21 on amiodarone. He does need to continue amiodarone given his recent ventricular tachycardia. Please consider treatment with synthroid.  Thanks MJP

## 2022-12-10 ENCOUNTER — Telehealth (HOSPITAL_COMMUNITY): Payer: Self-pay

## 2022-12-10 NOTE — Telephone Encounter (Signed)
Called and spoke with patient to schedule Modified Barium Swallow - patient stated that he would like to wait till after the first of year to schedule. Will continue to follow.

## 2022-12-16 DIAGNOSIS — Z23 Encounter for immunization: Secondary | ICD-10-CM | POA: Diagnosis not present

## 2022-12-25 ENCOUNTER — Other Ambulatory Visit (HOSPITAL_COMMUNITY): Payer: Self-pay

## 2022-12-25 DIAGNOSIS — R059 Cough, unspecified: Secondary | ICD-10-CM

## 2022-12-25 DIAGNOSIS — R1310 Dysphagia, unspecified: Secondary | ICD-10-CM

## 2022-12-26 DIAGNOSIS — I5032 Chronic diastolic (congestive) heart failure: Secondary | ICD-10-CM | POA: Diagnosis not present

## 2022-12-30 ENCOUNTER — Ambulatory Visit (INDEPENDENT_AMBULATORY_CARE_PROVIDER_SITE_OTHER): Payer: Medicare Other

## 2022-12-30 ENCOUNTER — Other Ambulatory Visit (HOSPITAL_COMMUNITY): Payer: Self-pay

## 2022-12-30 DIAGNOSIS — I214 Non-ST elevation (NSTEMI) myocardial infarction: Secondary | ICD-10-CM | POA: Diagnosis not present

## 2022-12-30 LAB — CUP PACEART REMOTE DEVICE CHECK
Battery Remaining Longevity: 162 mo
Battery Remaining Percentage: 100 %
Brady Statistic RV Percent Paced: 2 %
Date Time Interrogation Session: 20240110024500
HighPow Impedance: 67 Ohm
Implantable Lead Connection Status: 753985
Implantable Lead Implant Date: 20220110
Implantable Lead Location: 753860
Implantable Lead Model: 137
Implantable Lead Serial Number: 301176
Implantable Pulse Generator Implant Date: 20220110
Lead Channel Impedance Value: 530 Ohm
Lead Channel Setting Pacing Amplitude: 2.5 V
Lead Channel Setting Pacing Pulse Width: 0.4 ms
Lead Channel Setting Sensing Sensitivity: 0.5 mV
Pulse Gen Serial Number: 211464

## 2023-01-06 DIAGNOSIS — E039 Hypothyroidism, unspecified: Secondary | ICD-10-CM | POA: Diagnosis not present

## 2023-01-06 DIAGNOSIS — I472 Ventricular tachycardia, unspecified: Secondary | ICD-10-CM | POA: Diagnosis not present

## 2023-01-07 ENCOUNTER — Ambulatory Visit (HOSPITAL_COMMUNITY)
Admission: RE | Admit: 2023-01-07 | Discharge: 2023-01-07 | Disposition: A | Discharge status: Home / Self Care | Payer: Medicare Other | Source: Ambulatory Visit | Attending: Internal Medicine | Admitting: Internal Medicine

## 2023-01-07 DIAGNOSIS — R1313 Dysphagia, pharyngeal phase: Secondary | ICD-10-CM | POA: Diagnosis not present

## 2023-01-07 DIAGNOSIS — R059 Cough, unspecified: Secondary | ICD-10-CM

## 2023-01-07 DIAGNOSIS — R4189 Other symptoms and signs involving cognitive functions and awareness: Secondary | ICD-10-CM | POA: Diagnosis not present

## 2023-01-07 DIAGNOSIS — R278 Other lack of coordination: Secondary | ICD-10-CM | POA: Diagnosis not present

## 2023-01-07 DIAGNOSIS — R1310 Dysphagia, unspecified: Secondary | ICD-10-CM

## 2023-01-07 LAB — COMPREHENSIVE METABOLIC PANEL
ALT: 8 IU/L (ref 0–44)
AST: 23 IU/L (ref 0–40)
Albumin/Globulin Ratio: 1.4 (ref 1.2–2.2)
Albumin: 4.1 g/dL (ref 3.7–4.7)
Alkaline Phosphatase: 71 IU/L (ref 44–121)
BUN/Creatinine Ratio: 16 (ref 10–24)
BUN: 28 mg/dL — ABNORMAL HIGH (ref 8–27)
Bilirubin Total: 0.8 mg/dL (ref 0.0–1.2)
CO2: 25 mmol/L (ref 20–29)
Calcium: 9.5 mg/dL (ref 8.6–10.2)
Chloride: 94 mmol/L — ABNORMAL LOW (ref 96–106)
Creatinine, Ser: 1.71 mg/dL — ABNORMAL HIGH (ref 0.76–1.27)
Globulin, Total: 2.9 g/dL (ref 1.5–4.5)
Glucose: 112 mg/dL — ABNORMAL HIGH (ref 70–99)
Potassium: 4.2 mmol/L (ref 3.5–5.2)
Sodium: 139 mmol/L (ref 134–144)
Total Protein: 7 g/dL (ref 6.0–8.5)
eGFR: 40 mL/min/{1.73_m2} — ABNORMAL LOW (ref >59)

## 2023-01-07 LAB — TSH: TSH: 37.7 u[IU]/mL — ABNORMAL HIGH (ref 0.450–4.500)

## 2023-01-07 NOTE — Progress Notes (Addendum)
Modified Barium Swallow Progress Note  Patient Details  Name: Ryan [name] MRN: [id number] Date of Birth: 02/11/1941  Today's Date: 01/07/2023  Modified Barium Swallow completed.  Full report located under Chart Review in the Imaging Section.  Brief recommendations include the following:  Clinical Impression  Pt has a pharyngeal dysphagia marked by reduced base of tongue retraction, pharyngeal squeeze, and anterior hyoid movement. Epiglottic inversion is impacted, with some posterior movement observed but not complete inversion. There is pharyngeal residue remaining in the valleculae across all consistencies (including the barium tablet, transiently), and intermittently in the pyriform sinuses. Pt appeared to have silent aspiration x1 of pureed residue, not directly observed but presumed to have occurred while fluoro was off. In addition to the aforementioned impairments, pt also has moments of impaired coordination in which liquids spill into the laryngeal vestibule before the swallow. He has good laryngeal vestibule closure and so when this happens in smaller amounts, he can clear penetrates pretty well (mostly PAS 2, an instance of PAS 3 with pill). He had sensed aspiration (PAS 7) x1 when a larger volume entered the laryngeal vestibule, which he could not fully clear. Pt increased his safety and efficiency with cues to take small, single sips and to perform second swallows to clear residue. Recommend continuing with regular solids (as tolerated in the setting of esophageal stricture) and thin liquids, avoiding mixed consistencies, using two swallows per bolus, and taking small, single sips. He would benefit from ongoing SLP follow up.   Swallow Evaluation Recommendations   Recommended Consults: Consider GI evaluation (pt with esophageal stricture per esophagram)   SLP Diet Recommendations: Regular solids;Thin liquid   Liquid Administration via: Cup;Straw   Medication Administration:  Whole meds with puree   Supervision: Patient able to self feed   Compensations: Minimize environmental distractions;Slow rate;Small sips/bites;Multiple dry swallows after each bite/sip   Postural Changes: Remain semi-upright after after feeds/meals (Comment);Seated upright at 90 degrees   Oral Care Recommendations: Oral care BID        Osie Bond., M.A. [place] Office [phone]  Secure chat preferred  01/07/2023,3:37 PM

## 2023-01-12 DIAGNOSIS — R7989 Other specified abnormal findings of blood chemistry: Secondary | ICD-10-CM | POA: Insufficient documentation

## 2023-01-12 NOTE — Progress Notes (Unsigned)
Patient referred by Ginger Organ., MD for chest pain, shortness of breath  Subjective:   Ryan [name], male    DOB: 09-Aug-1941, 82 y.o.   MRN: [id number]   No chief complaint on file.     HPI  82 year old caucasian male with coronary artery disease status s/p CABG (patent LIMA-LAD, occluded SVG-ramus/OM Y graft), ischemic cardiomyopathy with recovered EF, s/p ICD, mod AS, persistent Afib, PAD s/o aortobifemoral bypass, mod carotid disease, h/o stroke, CKD III  Patient recently had an approproate shck for VT. He was seen by Dr. Lovena Le, who recommended amiodarone 200 mg bid until 12/21/22 and then take 200 mg daily, M-F and 400 mg s/s.   He has occasional angina symptoms but able to walk 2-3 times a day. He continues to have persistent dizziness.   Reviewed recent test results with the patient, details below.    Patient's home weight has been stable around his baseline.   Average Weight Level 136.68 lbs Lowest Weight Level 135 lbs Highest Weight Level 138.2 lbs   12/02/2022 Wednesday at 07:34 AM 136           12/01/2022 Tuesday at 07:28 AM      136.2        11/30/2022 Monday at 07:21 AM       136.2        11/29/2022 'Sunday at 07:16 AM        135           11/28/2022 Saturday at 07:21 AM      137           11/27/2022 Friday at 07:17 AM          136.8        11/26/2022 Thursday at 07:26 AM     136.2        11/25/2022 Wednesday at 07:33 AM 136.8        11/24/2022 Tuesday at 07:04 AM      137           11/23/2022 Monday at 07:28 AM       137.8        11/22/2022 Sunday at 07:25 AM        13'$ 7.4   Current Outpatient Medications:    acetaminophen (TYLENOL) 325 MG tablet, Take 2 tablets (650 mg total) by mouth every 6 (six) hours as needed for mild pain (or Fever >/= 101)., Disp: 20 tablet, Rfl: 0   albuterol (VENTOLIN HFA) 108 (90 Base) MCG/ACT inhaler, Inhale 1 puff into the lungs as needed., Disp: , Rfl:    allopurinol (ZYLOPRIM) 100 MG tablet, Take 100 mg by mouth  daily as needed (gout)., Disp: , Rfl:    ALPRAZolam (XANAX) 0.5 MG tablet, Take 0.5 mg by mouth 3 (three) times daily as needed for anxiety or sleep., Disp: , Rfl:    amiodarone (PACERONE) 200 MG tablet, Take 1 tablet (200 mg total) by mouth 2 (two) times daily. Until January 1st;  Then, Take 1 tablet ( 200 mg total ) by mouth once daily, Monday through Friday;  On Saturday and Sunday: Take 1 tablet ( 200 mg total ) by mouth 2 (two) times daily ONLY, until February 19, 2023., Disp: 90 tablet, Rfl: 7   apixaban (ELIQUIS) 2.5 MG TABS tablet, Take 2.5 mg by mouth 2 (two) times daily., Disp: , Rfl:    bisoprolol (ZEBETA) 5 MG tablet, Take 0.5 tablets (2.5 mg total) by mouth daily.,  Disp: 1 tablet, Rfl: 0   Evolocumab (REPATHA SURECLICK) 384 MG/ML SOAJ, Inject 140 mg into the skin every 14 (fourteen) days., Disp: 2 mL, Rfl: 11   FARXIGA 10 MG TABS tablet, TAKE 1 TABLET BY MOUTH DAILY, Disp: 30 tablet, Rfl: 2   finasteride (PROSCAR) 5 MG tablet, Take 5 mg by mouth in the morning., Disp: , Rfl:    losartan (COZAAR) 25 MG tablet, Take 0.5 tablets (12.5 mg total) by mouth daily., Disp: 15 tablet, Rfl: 3   meclizine (ANTIVERT) 12.5 MG tablet, TAKE ONE TABLET BY MOUTH THREE TIMES A DAY AS NEEDED FOR DIZZINESS, Disp: 60 tablet, Rfl: 1   methocarbamol (ROBAXIN) 500 MG tablet, Take 500 mg by mouth daily as needed for muscle spasms (back pain.)., Disp: , Rfl:    metoCLOPramide (REGLAN) 5 MG tablet, Take 1 tablet (5 mg total) by mouth every 6 (six) hours as needed for nausea., Disp: 15 tablet, Rfl: 0   nitroGLYCERIN (NITROSTAT) 0.4 MG SL tablet, Place 1 tablet (0.4 mg total) under the tongue every 5 (five) minutes as needed for chest pain., Disp: 25 tablet, Rfl: 3   ondansetron (ZOFRAN) 4 MG tablet, Take 4 mg by mouth every 8 (eight) hours as needed for nausea or vomiting., Disp: , Rfl:    pantoprazole (PROTONIX) 40 MG tablet, Take 1 tablet (40 mg total) by mouth daily., Disp: 30 tablet, Rfl: 11   polyethylene glycol  powder (GLYCOLAX/MIRALAX) 17 GM/SCOOP powder, Take 17 g by mouth in the morning and at bedtime., Disp: , Rfl:    ranolazine (RANEXA) 1000 MG SR tablet, Take 1 tablet (1,000 mg total) by mouth 2 (two) times daily., Disp: 180 tablet, Rfl: 3   tamsulosin (FLOMAX) 0.4 MG CAPS capsule, Take 0.4 mg by mouth in the morning., Disp: , Rfl:    tobramycin (TOBREX) 0.3 % ophthalmic solution, Place 1 drop into the left eye 2 (two) times daily., Disp: , Rfl:    torsemide (DEMADEX) 20 MG tablet, Take 1 tablet (20 mg total) by mouth daily., Disp: 30 tablet, Rfl: 1  Cardiovascular and other pertinent studies:  EKG 10/29/2022: Atrial fibrillation 67 bpm Diffuse low voltage Nonspecific T-abnormality   Echocardiogram 12/27/2021:  1. Left ventricular ejection fraction, by estimation, is 50 to 55%. The  left ventricle has low normal function. The left ventricle demonstrates  regional wall motion abnormalities (see scoring diagram/findings for  description). Diastolic function not  assessed due to Afib. There is severe hypokinesis of the left ventricular,  basal anteroseptal wall.   2. Right ventricular systolic function is mildly reduced. The right  ventricular size is normal. There is mildly elevated pulmonary artery  systolic pressure.   3. Left atrial size was severely dilated.   4. Right atrial size was moderately dilated.   5. The mitral valve is grossly normal. Mild mitral valve regurgitation.   6. Mild tricuspid regurgitation. peak RA-RV gradient 33 mmHg. . Tricuspid  valve regurgitation is mild.   7. Vmax 2.3 m/sec, mean PG 13 mmHg. AVA by continuity equation is 0.9  cm2, with dimensionless index of 0.24. Small LV cavity with LV SV index of  20 cc/m2. Likely paradoxically low flow low gradient severe aortic  stenosis. . The aortic valve is  tricuspid. There is moderate calcification of the aortic valve.   8. Overall, LVEF assessment remains difficult. No significant change  compared to previous  study in 08/2021.   Coronary and bypass graft angiography 01/2019: Severe native coronary artery disease with diffuse  proximal to mid LAD stenosis with diffusely diseased native LAD proximally and mid with competitive filling from the LIMA graft; 90% proximal stenosis in the ramus immediate vessel, diffuse stenosis of 60 to 70% in the proximal circumflex with diffuse 80% distal stenosis and total occlusion of the AV groove after marginal vessel with total occlusion of the proximal native RCA.  There is extensive collateralization to the distal RCA both via the circumflex vessel as well as via the LIMA to LAD and distal LAD.   Patent LIMA graft supplying the mid LAD   Previously documented old occlusion of the previous Y graft which had supplied the ramus and marginal vessel in the vein graft which had supplied the PDA.   Low right heart pressures with mean PA pressure at 19 mmHg.   RECOMMENDATION: Increase medical therapy.  The patient has only been on low-dose anti-ischemic medical regimen consisting of bisoprolol 2.5 mg as well as isosorbide 60 mg.  Recommend initiation of amlodipine 5 mg.  Also recommend consideration of Ranexa 500 mg twice a day.  The patient is on rosuvastatin.  However if the patient is to be on atorvastatin, the maximum recommended dose of atorvastatin is 40 mg if the patient is on ranolazine.  Recent labs: 01/06/2023: Glucose 112, BUN/Cr 28/1.71. EGFR 40. Na/K 139/4.2. Rest of the CMP normal  Latest Reference Range & Units 01/28/19 02:51 07/29/20 12:13 12/29/20 02:05 03/22/21 14:59 08/27/21 19:34 09/11/21 03:41 11/23/22 10:38 01/06/23 10:12  TSH 0.450 - 4.500 uIU/mL 39.642 (H) 14.000 (H) 6.705 (H) 7.434 (H) 11.949 (H) 5.069 (H) 21.600 (H) 37.700 (H)  (H): Data is abnormally high  11/23/2022: Glucose 118, BUN/Cr 25/1.46. EGFR 48. Na/K 141/5.0. Rest of the CMP normal TSH 21 high  06/03/2022: Glucose 101, BUN/Cr 15/1.3. EGFR 53. Na/K 134/4.4.  H/H 13/39. MCV 102.7.  Platelets 352 HbA1C 5.4% Chol 133, TG 87, HDL 45, LDL 71 TSH 17.9 high. Free T4 0.8 low normal  05/12/2022: BNP 1067  Review of Systems  HENT:  Positive for tinnitus.   Cardiovascular:  Negative for chest pain, dyspnea on exertion, leg swelling, palpitations and syncope.  Respiratory:  Negative for shortness of breath.   Gastrointestinal:  Positive for constipation and nausea.  Genitourinary:        Self catheterization at home due to urinary retention  Neurological:  Positive for dizziness.         There were no vitals filed for this visit.   No data found.    There is no height or weight on file to calculate BMI. There were no vitals filed for this visit.    Objective:   Physical Exam Vitals and nursing note reviewed.  Constitutional:      General: He is not in acute distress. Neck:     Vascular: JVD present.  Cardiovascular:     Rate and Rhythm: Normal rate. Rhythm irregular.     Pulses:          Dorsalis pedis pulses are 0 on the right side and 0 on the left side.       Posterior tibial pulses are 0 on the right side and 1+ on the left side.     Heart sounds: Murmur heard.     Harsh midsystolic murmur is present with a grade of 2/6 at the upper right sternal border radiating to the neck.  Pulmonary:     Effort: Pulmonary effort is normal.     Breath sounds: Normal breath sounds. No wheezing or  rales.  Musculoskeletal:     Right lower leg: Edema (1+) present.     Left lower leg: Edema (1+) present.       Assessment & Recommendations:   82 y/o caucasian male with coronary artery disease status s/p CABG (patent LIMA-LAD, occluded SVG-ramus/OM Y graft), ischemic cardiomyopathy with recovered EF, s/p ICD, mod AS, persistent Afib, PAD s/o aortobifemoral bypass, mod carotid disease, h/o stroke, CKD III, now with chest pain  HFrEF: Euvolemic.  Continue principal care management and remote patient monitoring for HFrEF. Continue losartan 12.5 mg daily, Farxiga  10 mg daily, bisoprolol 2.5 mg daily.He had hyperkalemia with Entresto before.   NSVT: Recent recurrence. On amiodarone as per Dr. Tanna Furry recommendations.  He has had recent increase in TSH to 21. I will check with Dr. Lovena Le re: this.  CAD: I do not think he has any targets for percutaneous intervention. Optimize medical management. Increase Ranexa to 1000 mg bid.   Persistent A. fib/atrial flutter: Longstanding.  Rate controlled. High CHA2DS2VAsc score Continue Eliquis 5 mg daily.    CKD stage III: Stable  PAD: S/p aortobifem bypass.  No critical limb ischemia.   Labs (CMP, TSH) in 4 weeks, f/u in 6 weeks     Nigel Mormon, MD Pager: [phone] Office: [phone]

## 2023-01-13 ENCOUNTER — Telehealth: Payer: Self-pay

## 2023-01-13 ENCOUNTER — Encounter: Payer: Self-pay | Admitting: Cardiology

## 2023-01-13 ENCOUNTER — Ambulatory Visit: Payer: Medicare Other | Admitting: Cardiology

## 2023-01-13 VITALS — BP 136/72 | HR 65 | Resp 16 | Ht 66.0 in | Wt 143.8 lb

## 2023-01-13 DIAGNOSIS — I472 Ventricular tachycardia, unspecified: Secondary | ICD-10-CM

## 2023-01-13 DIAGNOSIS — I25118 Atherosclerotic heart disease of native coronary artery with other forms of angina pectoris: Secondary | ICD-10-CM

## 2023-01-13 DIAGNOSIS — I4819 Other persistent atrial fibrillation: Secondary | ICD-10-CM

## 2023-01-13 DIAGNOSIS — R7989 Other specified abnormal findings of blood chemistry: Secondary | ICD-10-CM | POA: Diagnosis not present

## 2023-01-13 DIAGNOSIS — I4729 Other ventricular tachycardia: Secondary | ICD-10-CM

## 2023-01-13 DIAGNOSIS — I5042 Chronic combined systolic (congestive) and diastolic (congestive) heart failure: Secondary | ICD-10-CM

## 2023-01-13 DIAGNOSIS — E039 Hypothyroidism, unspecified: Secondary | ICD-10-CM | POA: Diagnosis not present

## 2023-01-13 NOTE — Telephone Encounter (Signed)
Patient home weight has been fairly stable.   Average Weight Level 136.63 lbs Lowest Weight Level 133.8 lbs Highest Weight Level 138.6 lbs  01/13/2023 Wednesday at 07:40 AM 138.2      01/11/2023 Monday at 07:32 AM 136      01/10/2023 'Sunday at 08:18 AM 136      01/09/2023 Saturday at 09:31 AM 137.6      01/08/2023 Friday at 07:40 AM 135.8      01/07/2023 Thursday at 07:22 AM 136.6      01/06/2023 Wednesday at 06:51 AM 136.8      01/05/2023 Tuesday at 07:28 AM 136.8      01/04/2023 Monday at 08:09 AM 138.6      01/03/2023 Sunday at 07:09 AM 137.2      01/02/2023 Saturday at 07:32 AM 137.2      01'$ /11/2023 Friday at 07:20 AM 137

## 2023-01-19 ENCOUNTER — Other Ambulatory Visit: Payer: Self-pay | Admitting: Cardiology

## 2023-01-19 ENCOUNTER — Other Ambulatory Visit: Payer: Self-pay | Admitting: Physician Assistant

## 2023-01-19 DIAGNOSIS — R42 Dizziness and giddiness: Secondary | ICD-10-CM

## 2023-01-20 ENCOUNTER — Other Ambulatory Visit: Payer: Self-pay | Admitting: Physician Assistant

## 2023-01-21 NOTE — Progress Notes (Signed)
Remote ICD transmission.   

## 2023-01-26 DIAGNOSIS — I5032 Chronic diastolic (congestive) heart failure: Secondary | ICD-10-CM | POA: Diagnosis not present

## 2023-01-28 ENCOUNTER — Other Ambulatory Visit: Payer: Self-pay | Admitting: Cardiology

## 2023-01-28 DIAGNOSIS — I5042 Chronic combined systolic (congestive) and diastolic (congestive) heart failure: Secondary | ICD-10-CM

## 2023-02-02 DIAGNOSIS — N1831 Chronic kidney disease, stage 3a: Secondary | ICD-10-CM | POA: Diagnosis not present

## 2023-02-02 DIAGNOSIS — G720 Drug-induced myopathy: Secondary | ICD-10-CM | POA: Diagnosis not present

## 2023-02-02 DIAGNOSIS — R7301 Impaired fasting glucose: Secondary | ICD-10-CM | POA: Diagnosis not present

## 2023-02-02 DIAGNOSIS — R1319 Other dysphagia: Secondary | ICD-10-CM | POA: Diagnosis not present

## 2023-02-02 DIAGNOSIS — E785 Hyperlipidemia, unspecified: Secondary | ICD-10-CM | POA: Diagnosis not present

## 2023-02-02 DIAGNOSIS — I130 Hypertensive heart and chronic kidney disease with heart failure and stage 1 through stage 4 chronic kidney disease, or unspecified chronic kidney disease: Secondary | ICD-10-CM | POA: Diagnosis not present

## 2023-02-02 DIAGNOSIS — D6869 Other thrombophilia: Secondary | ICD-10-CM | POA: Diagnosis not present

## 2023-02-02 DIAGNOSIS — I480 Paroxysmal atrial fibrillation: Secondary | ICD-10-CM | POA: Diagnosis not present

## 2023-02-02 DIAGNOSIS — I25810 Atherosclerosis of coronary artery bypass graft(s) without angina pectoris: Secondary | ICD-10-CM | POA: Diagnosis not present

## 2023-02-02 DIAGNOSIS — E039 Hypothyroidism, unspecified: Secondary | ICD-10-CM | POA: Diagnosis not present

## 2023-02-02 DIAGNOSIS — I5020 Unspecified systolic (congestive) heart failure: Secondary | ICD-10-CM | POA: Diagnosis not present

## 2023-02-02 DIAGNOSIS — I700 Atherosclerosis of aorta: Secondary | ICD-10-CM | POA: Diagnosis not present

## 2023-02-08 DIAGNOSIS — Z1152 Encounter for screening for COVID-19: Secondary | ICD-10-CM | POA: Diagnosis not present

## 2023-02-08 DIAGNOSIS — R296 Repeated falls: Secondary | ICD-10-CM | POA: Diagnosis not present

## 2023-02-08 DIAGNOSIS — J069 Acute upper respiratory infection, unspecified: Secondary | ICD-10-CM | POA: Diagnosis not present

## 2023-02-08 DIAGNOSIS — J029 Acute pharyngitis, unspecified: Secondary | ICD-10-CM | POA: Diagnosis not present

## 2023-02-08 DIAGNOSIS — R0981 Nasal congestion: Secondary | ICD-10-CM | POA: Diagnosis not present

## 2023-02-08 DIAGNOSIS — N1831 Chronic kidney disease, stage 3a: Secondary | ICD-10-CM | POA: Diagnosis not present

## 2023-02-08 DIAGNOSIS — I130 Hypertensive heart and chronic kidney disease with heart failure and stage 1 through stage 4 chronic kidney disease, or unspecified chronic kidney disease: Secondary | ICD-10-CM | POA: Diagnosis not present

## 2023-02-25 DIAGNOSIS — I5032 Chronic diastolic (congestive) heart failure: Secondary | ICD-10-CM | POA: Diagnosis not present

## 2023-03-17 ENCOUNTER — Other Ambulatory Visit: Payer: Self-pay | Admitting: Cardiology

## 2023-03-17 DIAGNOSIS — R42 Dizziness and giddiness: Secondary | ICD-10-CM

## 2023-03-24 DIAGNOSIS — K222 Esophageal obstruction: Secondary | ICD-10-CM | POA: Diagnosis not present

## 2023-03-24 DIAGNOSIS — K5900 Constipation, unspecified: Secondary | ICD-10-CM | POA: Diagnosis not present

## 2023-03-24 DIAGNOSIS — I2510 Atherosclerotic heart disease of native coronary artery without angina pectoris: Secondary | ICD-10-CM | POA: Diagnosis not present

## 2023-03-24 DIAGNOSIS — R1310 Dysphagia, unspecified: Secondary | ICD-10-CM | POA: Diagnosis not present

## 2023-03-24 DIAGNOSIS — K219 Gastro-esophageal reflux disease without esophagitis: Secondary | ICD-10-CM | POA: Diagnosis not present

## 2023-03-27 DIAGNOSIS — I5032 Chronic diastolic (congestive) heart failure: Secondary | ICD-10-CM | POA: Diagnosis not present

## 2023-04-13 DIAGNOSIS — R8271 Bacteriuria: Secondary | ICD-10-CM | POA: Diagnosis not present

## 2023-04-13 DIAGNOSIS — N3000 Acute cystitis without hematuria: Secondary | ICD-10-CM | POA: Diagnosis not present

## 2023-04-14 ENCOUNTER — Ambulatory Visit: Payer: Medicare Other | Admitting: Cardiology

## 2023-04-14 ENCOUNTER — Encounter: Payer: Self-pay | Admitting: Cardiology

## 2023-04-14 ENCOUNTER — Telehealth: Payer: Self-pay

## 2023-04-14 VITALS — BP 127/72 | HR 68 | Resp 16 | Ht 66.0 in | Wt 139.4 lb

## 2023-04-14 DIAGNOSIS — I4819 Other persistent atrial fibrillation: Secondary | ICD-10-CM | POA: Diagnosis not present

## 2023-04-14 DIAGNOSIS — E039 Hypothyroidism, unspecified: Secondary | ICD-10-CM | POA: Diagnosis not present

## 2023-04-14 DIAGNOSIS — I5020 Unspecified systolic (congestive) heart failure: Secondary | ICD-10-CM | POA: Diagnosis not present

## 2023-04-14 DIAGNOSIS — I25118 Atherosclerotic heart disease of native coronary artery with other forms of angina pectoris: Secondary | ICD-10-CM

## 2023-04-14 DIAGNOSIS — I472 Ventricular tachycardia, unspecified: Secondary | ICD-10-CM | POA: Diagnosis not present

## 2023-04-14 NOTE — Telephone Encounter (Signed)
Patient weight fluctuates some but overall is okay. Last OV weight in Jan 2024 was 143 lb.   30-d average weight lb --  134.7 (131.8 - 137.8)

## 2023-04-14 NOTE — Progress Notes (Signed)
Patient referred by Cleatis Polka., MD for chest pain, shortness of breath  Subjective:   Ryan [name], male    DOB: April 12, 1941, 82 y.o.   MRN: [id number]   Chief Complaint  Patient presents with   Coronary Artery Disease   HFrEF   Follow-up    3 months     HPI  82 year old caucasian male with coronary artery disease status s/p CABG (patent LIMA-LAD, occluded SVG-ramus/OM Y graft), ischemic cardiomyopathy with recovered EF, s/p ICD, mod AS, persistent Afib, PAD s/o aortobifemoral bypass, mod carotid disease, h/o stroke, CKD III  Patient weight fluctuates some but overall is okay. Last OV weight in Jan 2024 was 143 lb.    30-d average weight lb --        134.7 (131.8 - 137.8)     He feels dizzy and nauseated after taking his medications. Reviewed recent test results with the patient, details below. He had not been taking levothyroxine until recently.     Current Outpatient Medications:    acetaminophen (TYLENOL) 325 MG tablet, Take 2 tablets (650 mg total) by mouth every 6 (six) hours as needed for mild pain (or Fever >/= 101)., Disp: 20 tablet, Rfl: 0   albuterol (VENTOLIN HFA) 108 (90 Base) MCG/ACT inhaler, Inhale 1 puff into the lungs as needed., Disp: , Rfl:    allopurinol (ZYLOPRIM) 100 MG tablet, Take 100 mg by mouth daily as needed (gout)., Disp: , Rfl:    ALPRAZolam (XANAX) 0.5 MG tablet, Take 0.5 mg by mouth 3 (three) times daily as needed for anxiety or sleep., Disp: , Rfl:    amiodarone (PACERONE) 200 MG tablet, Take 1 tablet (200 mg total) by mouth 2 (two) times daily. Until January 1st;  Then, Take 1 tablet ( 200 mg total ) by mouth once daily, Monday through Friday;  On Saturday and Sunday: Take 1 tablet ( 200 mg total ) by mouth 2 (two) times daily ONLY, until February 19, 2023., Disp: 90 tablet, Rfl: 7   apixaban (ELIQUIS) 2.5 MG TABS tablet, Take 2.5 mg by mouth 2 (two) times daily., Disp: , Rfl:    bisacodyl (DULCOLAX) 5 MG EC tablet, Take 5 mg by mouth  daily as needed for moderate constipation., Disp: , Rfl:    bisoprolol (ZEBETA) 5 MG tablet, TAKE 1/2 TABLET BY MOUTH DAILY, Disp: 45 tablet, Rfl: 3   Evolocumab (REPATHA SURECLICK) 140 MG/ML SOAJ, Inject 140 mg into the skin every 14 (fourteen) days., Disp: 2 mL, Rfl: 11   FARXIGA 10 MG TABS tablet, TAKE 1 TABLET BY MOUTH DAILY, Disp: 30 tablet, Rfl: 2   finasteride (PROSCAR) 5 MG tablet, Take 5 mg by mouth in the morning., Disp: , Rfl:    meclizine (ANTIVERT) 12.5 MG tablet, TAKE 1 TABLET BY MOUTH 3 TIMES DAILY AS NEEDED FOR DIZZINESS, Disp: 60 tablet, Rfl: 1   methocarbamol (ROBAXIN) 500 MG tablet, Take 500 mg by mouth daily as needed for muscle spasms (back pain.)., Disp: , Rfl:    metoCLOPramide (REGLAN) 5 MG tablet, Take 1 tablet (5 mg total) by mouth every 6 (six) hours as needed for nausea., Disp: 15 tablet, Rfl: 0   nitroGLYCERIN (NITROSTAT) 0.4 MG SL tablet, Place 1 tablet (0.4 mg total) under the tongue every 5 (five) minutes as needed for chest pain., Disp: 25 tablet, Rfl: 3   ondansetron (ZOFRAN) 4 MG tablet, Take 4 mg by mouth every 8 (eight) hours as needed for nausea or  vomiting., Disp: , Rfl:    pantoprazole (PROTONIX) 40 MG tablet, Take 1 tablet (40 mg total) by mouth daily., Disp: 30 tablet, Rfl: 11   polyethylene glycol powder (GLYCOLAX/MIRALAX) 17 GM/SCOOP powder, Take 17 g by mouth in the morning and at bedtime., Disp: , Rfl:    ranolazine (RANEXA) 1000 MG SR tablet, Take 1 tablet (1,000 mg total) by mouth 2 (two) times daily., Disp: 180 tablet, Rfl: 3   tamsulosin (FLOMAX) 0.4 MG CAPS capsule, Take 0.4 mg by mouth in the morning., Disp: , Rfl:    tobramycin (TOBREX) 0.3 % ophthalmic solution, Place 1 drop into the left eye 2 (two) times daily., Disp: , Rfl:    torsemide (DEMADEX) 20 MG tablet, Take 1 tablet (20 mg total) by mouth daily., Disp: 30 tablet, Rfl: 1  Cardiovascular and other pertinent studies:  EKG 10/29/2022: Atrial fibrillation 67 bpm Diffuse low  voltage Nonspecific T-abnormality   Echocardiogram 12/27/2021:  1. Left ventricular ejection fraction, by estimation, is 50 to 55%. The  left ventricle has low normal function. The left ventricle demonstrates  regional wall motion abnormalities (see scoring diagram/findings for  description). Diastolic function not  assessed due to Afib. There is severe hypokinesis of the left ventricular,  basal anteroseptal wall.   2. Right ventricular systolic function is mildly reduced. The right  ventricular size is normal. There is mildly elevated pulmonary artery  systolic pressure.   3. Left atrial size was severely dilated.   4. Right atrial size was moderately dilated.   5. The mitral valve is grossly normal. Mild mitral valve regurgitation.   6. Mild tricuspid regurgitation. peak RA-RV gradient 33 mmHg. . Tricuspid  valve regurgitation is mild.   7. Vmax 2.3 m/sec, mean PG 13 mmHg. AVA by continuity equation is 0.9  cm2, with dimensionless index of 0.24. Small LV cavity with LV SV index of  20 cc/m2. Likely paradoxically low flow low gradient severe aortic  stenosis. . The aortic valve is  tricuspid. There is moderate calcification of the aortic valve.   8. Overall, LVEF assessment remains difficult. No significant change  compared to previous study in 08/2021.   Coronary and bypass graft angiography 01/2019: Severe native coronary artery disease with diffuse proximal to mid LAD stenosis with diffusely diseased native LAD proximally and mid with competitive filling from the LIMA graft; 90% proximal stenosis in the ramus immediate vessel, diffuse stenosis of 60 to 70% in the proximal circumflex with diffuse 80% distal stenosis and total occlusion of the AV groove after marginal vessel with total occlusion of the proximal native RCA.  There is extensive collateralization to the distal RCA both via the circumflex vessel as well as via the LIMA to LAD and distal LAD.   Patent LIMA graft supplying the  mid LAD   Previously documented old occlusion of the previous Y graft which had supplied the ramus and marginal vessel in the vein graft which had supplied the PDA.   Low right heart pressures with mean PA pressure at 19 mmHg.   RECOMMENDATION: Increase medical therapy.  The patient has only been on low-dose anti-ischemic medical regimen consisting of bisoprolol 2.5 mg as well as isosorbide 60 mg.  Recommend initiation of amlodipine 5 mg.  Also recommend consideration of Ranexa 500 mg twice a day.  The patient is on rosuvastatin.  However if the patient is to be on atorvastatin, the maximum recommended dose of atorvastatin is 40 mg if the patient is on ranolazine.  Recent  labs: 02/02/2023: Glucose 95, BUN/Cr 23/1.7. EGFR 38.9. Na/K 138/4.5. Rest of the CMP normal TSH 23.7 high. Free T4 0.7 low  01/06/2023: Glucose 112, BUN/Cr 28/1.71. EGFR 40. Na/K 139/4.2. Rest of the CMP normal  Latest Reference Range & Units 01/28/19 02:51 07/29/20 12:13 12/29/20 02:05 03/22/21 14:59 08/27/21 19:34 09/11/21 03:41 11/23/22 10:38 01/06/23 10:12  TSH 0.450 - 4.500 uIU/mL 39.642 (H) 14.000 (H) 6.705 (H) 7.434 (H) 11.949 (H) 5.069 (H) 21.600 (H) 37.700 (H)  (H): Data is abnormally high  11/23/2022: Glucose 118, BUN/Cr 25/1.46. EGFR 48. Na/K 141/5.0. Rest of the CMP normal TSH 21 high  06/03/2022: Glucose 101, BUN/Cr 15/1.3. EGFR 53. Na/K 134/4.4.  H/H 13/39. MCV 102.7. Platelets 352 HbA1C 5.4% Chol 133, TG 87, HDL 45, LDL 71 TSH 17.9 high. Free T4 0.8 low normal  05/12/2022: BNP 1067  Review of Systems  Cardiovascular:  Negative for chest pain, dyspnea on exertion, leg swelling, palpitations and syncope.  Respiratory:  Negative for shortness of breath.   Musculoskeletal:  Positive for falls.  Gastrointestinal:  Positive for nausea.  Genitourinary:        Self catheterization at home due to urinary retention  Neurological:  Positive for dizziness.         Vitals:   04/14/23 1307  BP: 127/72   Pulse: 68  Resp: 16  SpO2: 95%     Body mass index is 22.5 kg/m. Filed Weights   04/14/23 1307  Weight: 139 lb 6.4 oz (63.2 kg)     Objective:   Physical Exam Vitals and nursing note reviewed.  Constitutional:      General: He is not in acute distress. Neck:     Vascular: No JVD.  Cardiovascular:     Rate and Rhythm: Normal rate. Rhythm irregular.     Pulses:          Dorsalis pedis pulses are 0 on the right side and 0 on the left side.       Posterior tibial pulses are 0 on the right side and 1+ on the left side.     Heart sounds: Murmur heard.     Harsh midsystolic murmur is present with a grade of 2/6 at the upper right sternal border radiating to the neck.  Pulmonary:     Effort: Pulmonary effort is normal.     Breath sounds: Normal breath sounds. No wheezing or rales.  Musculoskeletal:     Right lower leg: No edema.     Left lower leg: No edema.        Assessment & Recommendations:   82 y/o caucasian male with coronary artery disease status s/p CABG (patent LIMA-LAD, occluded SVG-ramus/OM Y graft), ischemic cardiomyopathy with recovered EF, s/p ICD, mod AS, persistent Afib, PAD s/o aortobifemoral bypass, mod carotid disease, h/o stroke, CKD III, now with chest pain  HFrEF: Euvolemic.  Given his lightheadedness, I will stop [place]. Also could not tolerate losartan, Entresto due to lightheadedness, AKI, hyperkalemia in the past. Take torsemide no more than 20 mg daily. Encourage increased hydration. Continue bisoprolol 2.5 mg daily. Continue principal care management and remote patient monitoring for HFrEF. Will check echocardiogram.  Aortic stenosis: Moderate to severe. Check echocardiogram  NSVT: On amiodarone as per Dr. Lubertha Basque recommendations.  He has had recent increase in TSH to 39, now down to 23. Encourage taking levothyroxine as per PCP recommendations.  CAD: No angina symptoms on current anti anginal therapy.   Persistent A. fib/atrial  flutter: Longstanding.  Rate controlled.  High CHA2DS2VAsc score Continue Eliquis 5 mg daily.    CKD stage III: Stable  PAD: S/p aortobifem bypass.  No critical limb ischemia.   F/u in 4 weeks    Elder Negus, MD Pager: [phone] Office: [phone]

## 2023-04-16 ENCOUNTER — Ambulatory Visit: Payer: Medicare Other

## 2023-04-16 DIAGNOSIS — I5020 Unspecified systolic (congestive) heart failure: Secondary | ICD-10-CM | POA: Diagnosis not present

## 2023-04-26 DIAGNOSIS — I5032 Chronic diastolic (congestive) heart failure: Secondary | ICD-10-CM | POA: Diagnosis not present

## 2023-05-02 ENCOUNTER — Other Ambulatory Visit: Payer: Self-pay | Admitting: Cardiology

## 2023-05-02 DIAGNOSIS — I5042 Chronic combined systolic (congestive) and diastolic (congestive) heart failure: Secondary | ICD-10-CM

## 2023-05-06 ENCOUNTER — Telehealth: Payer: Self-pay

## 2023-05-06 NOTE — Telephone Encounter (Signed)
Clearance was sent to our office but this is not our pt faxing clearance back to requesting surgeon office

## 2023-05-20 ENCOUNTER — Other Ambulatory Visit: Payer: Self-pay

## 2023-05-20 ENCOUNTER — Ambulatory Visit: Payer: Medicare Other | Attending: Internal Medicine | Admitting: Internal Medicine

## 2023-05-20 ENCOUNTER — Encounter: Payer: Self-pay | Admitting: Internal Medicine

## 2023-05-20 VITALS — BP 106/50 | HR 75 | Ht 66.0 in | Wt 141.6 lb

## 2023-05-20 DIAGNOSIS — I472 Ventricular tachycardia, unspecified: Secondary | ICD-10-CM | POA: Insufficient documentation

## 2023-05-20 DIAGNOSIS — R7989 Other specified abnormal findings of blood chemistry: Secondary | ICD-10-CM

## 2023-05-20 DIAGNOSIS — Z95810 Presence of automatic (implantable) cardiac defibrillator: Secondary | ICD-10-CM | POA: Insufficient documentation

## 2023-05-20 DIAGNOSIS — I4819 Other persistent atrial fibrillation: Secondary | ICD-10-CM

## 2023-05-20 DIAGNOSIS — R42 Dizziness and giddiness: Secondary | ICD-10-CM | POA: Diagnosis not present

## 2023-05-20 MED ORDER — RANOLAZINE ER 500 MG PO TB12: 500.0000 mg | ORAL_TABLET | Freq: Two times a day (BID) | ORAL | 3 refills | Status: DC

## 2023-05-20 NOTE — Patient Instructions (Addendum)
Medication Instructions:  Your physician has recommended you make the following change in your medication: Medication decrease:  Ranolazine / Ranexa 500 mg ( Decreased to 1/2 tablet )  You will-   Take 1 tablet (500 mg total) by mouth 2 (two) times daily   Lab Work: None ordered.  If you have labs (blood work) drawn today and your tests are completely normal, you will receive your results only by: MyChart Message (if you have MyChart) OR A paper copy in the mail If you have any lab test that is abnormal or we need to change your treatment, we will call you to review the results.  Testing/Procedures: None ordered.  Follow-Up: At [facility], you and your health needs are our priority.  As part of our continuing mission to provide you with exceptional heart care, we have created designated Provider Care Teams.  These Care Teams include your primary Cardiologist (physician) and Advanced Practice Providers (APPs -  Physician Assistants and Nurse Practitioners) who all work together to provide you with the care you need, when you need it.  Your next appointment:   1 year(s)  The format for your next appointment:   In Person  Provider:   Lewayne Bunting, MD{or one of the following Advanced Practice Providers on your designated Care Team:   Francis Dowse, [place] Casimiro Needle "Mardelle Matte" Lanna Poche, [place]  Remote monitoring is used to monitor your ICD from home. This monitoring reduces the number of office visits required to check your device to one time per year. It allows [place] to keep an eye on the functioning of your device to ensure it is working properly. You are scheduled for a device check from home on 7/10. You may send your transmission at any time that day. If you have a wireless device, the transmission will be sent automatically. After your physician reviews your transmission, you will receive a postcard with your next transmission date.

## 2023-05-20 NOTE — Progress Notes (Signed)
HPI Mr. Wienckowski returns today for followup. He is a pleasant 82 yo man with a h/o LV dysfunction and VT and atrial fib who presented with VT back in January 2022 and underwent ICD implantation at that time. He has known CAD and is s/p MI remotely. His EF by echo in April demonstrates an EF of 25%. Since his ICD insertion he notes class 2 CHF symptoms. He has had an uptick in his ICD therapies and has increased his dose of amiodarone. He denies chest pain or sob. Since I saw him last in 11/23, he notes some fatigue but has overall feels better and his wife corroborates this. He does note some lightheadedness that he thinks is related to ranolazine.  Allergies  Allergen Reactions   Jardiance [Empagliflozin] Nausea Only and Other (See Comments)    Made patient very sick to the stomach.   Ezetimibe Other (See Comments)    Myalgias    Ace Inhibitors Cough    Other reaction(s): cough   Aspirin Other (See Comments)    skin rash/easy bruising   Atorvastatin Other (See Comments)    weakness, fatigue (severe)   Ezetimibe-Simvastatin Other (See Comments)    myalgias   Rosuvastatin Other (See Comments)    Myalgias/weakness   Codeine Nausea And Vomiting         Unithroid [Levothyroxine Sodium] Other (See Comments)    Caused blurry vision per pt     Current Outpatient Medications  Medication Sig Dispense Refill   acetaminophen (TYLENOL) 325 MG tablet Take 2 tablets (650 mg total) by mouth every 6 (six) hours as needed for mild pain (or Fever >/= 101). 20 tablet 0   albuterol (VENTOLIN HFA) 108 (90 Base) MCG/ACT inhaler Inhale 1 puff into the lungs as needed.     allopurinol (ZYLOPRIM) 100 MG tablet Take 100 mg by mouth daily as needed (gout).     ALPRAZolam (XANAX) 0.5 MG tablet Take 0.5 mg by mouth 3 (three) times daily as needed for anxiety or sleep.     apixaban (ELIQUIS) 2.5 MG TABS tablet Take 2.5 mg by mouth 2 (two) times daily.     bisacodyl (DULCOLAX) 5 MG EC tablet Take 5 mg by  mouth daily as needed for moderate constipation.     bisoprolol (ZEBETA) 5 MG tablet TAKE 1/2 TABLET BY MOUTH DAILY 45 tablet 3   Evolocumab (REPATHA SURECLICK) 140 MG/ML SOAJ Inject 140 mg into the skin every 14 (fourteen) days. 2 mL 11   finasteride (PROSCAR) 5 MG tablet Take 5 mg by mouth in the morning.     meclizine (ANTIVERT) 12.5 MG tablet TAKE 1 TABLET BY MOUTH 3 TIMES DAILY AS NEEDED FOR DIZZINESS 60 tablet 1   methocarbamol (ROBAXIN) 500 MG tablet Take 500 mg by mouth daily as needed for muscle spasms (back pain.).     metoCLOPramide (REGLAN) 5 MG tablet Take 1 tablet (5 mg total) by mouth every 6 (six) hours as needed for nausea. 15 tablet 0   nitroGLYCERIN (NITROSTAT) 0.4 MG SL tablet Place 1 tablet (0.4 mg total) under the tongue every 5 (five) minutes as needed for chest pain. 25 tablet 3   ondansetron (ZOFRAN) 4 MG tablet Take 4 mg by mouth every 8 (eight) hours as needed for nausea or vomiting.     pantoprazole (PROTONIX) 40 MG tablet Take 1 tablet (40 mg total) by mouth daily. 30 tablet 11   polyethylene glycol powder (GLYCOLAX/MIRALAX) 17 GM/SCOOP powder Take 17  g by mouth in the morning and at bedtime.     tobramycin (TOBREX) 0.3 % ophthalmic solution Place 1 drop into the left eye 2 (two) times daily.     torsemide (DEMADEX) 20 MG tablet Take 1 tablet (20 mg total) by mouth daily. 30 tablet 1   ranolazine (RANEXA) 500 MG 12 hr tablet Take 1 tablet (500 mg total) by mouth 2 (two) times daily. 90 tablet 3   No current facility-administered medications for this visit.     Past Medical History:  Diagnosis Date   Acquired thrombophilia (HCC) 11/01/2021   Arthritis    CAD (coronary artery disease)    Carotid artery occlusion    Cataract    Bil eyes/worse in left eye   CHF (congestive heart failure) (HCC)    Chronic back pain    COVID-19 10/31/2021   DVT (deep venous thrombosis) (HCC)    Dysrhythmia    Enlarged prostate    takes Rapaflo daily   GERD (gastroesophageal  reflux disease)    occasional   History of colon polyps    History of gout    has colchicine prn   History of kidney stones    Hyperlipidemia    takes Crestor daily   Hypertension    takes Amlodipine daily   Hypothyroidism    Myocardial infarction [facility])    Peripheral vascular disease (HCC)    Prolonged QT interval 11/01/2021   Pulmonary emboli (HCC) 03/20/2015   elevated d-dimer, intermediate V/Q study, atypical chest pain and SOB. Start on Xarelto 20mg  BID for 3 month   Rapid atrial fibrillation (HCC)    Renal insufficiency    Shortness of breath dyspnea    Urinary frequency    Urinary urgency     ROS:   All systems reviewed and negative except as noted in the HPI.   Past Surgical History:  Procedure Laterality Date   APPENDECTOMY     BACK SURGERY     5 times   big toe surgery     CARDIAC CATHETERIZATION     2010    dr Elease Hashimoto   cataract surgery     left eye   CHOLECYSTECTOMY N/A 07/27/2016   Procedure: LAPAROSCOPIC CHOLECYSTECTOMY;  Surgeon: Rodman Pickle, MD;  Location: [facility] OR;  Service: General;  Laterality: N/A;   COLONOSCOPY     CORONARY ARTERY BYPASS GRAFT N/A 04/05/2015   Procedure: CORONARY ARTERY BYPASS GRAFTING (CABG)X4 LIMA-LAD; SVG-DIAG1-DIAG2; SVG-PD;  Surgeon: Loreli Slot, MD;  Location: [facility] OR;  Service: Open Heart Surgery;  Laterality: N/A;   CORONARY BALLOON ANGIOPLASTY N/A 04/14/2022   Procedure: CORONARY BALLOON ANGIOPLASTY;  Surgeon: Elder Negus, MD;  Location: [facility] INVASIVE CV LAB;  Service: Cardiovascular;  Laterality: N/A;   CORONARY ULTRASOUND/IVUS N/A 04/14/2022   Procedure: Intravascular Ultrasound/IVUS;  Surgeon: Elder Negus, MD;  Location: [facility] INVASIVE CV LAB;  Service: Cardiovascular;  Laterality: N/A;   CORONARY/GRAFT ANGIOGRAPHY N/A 04/22/2018   Procedure: CORONARY/GRAFT ANGIOGRAPHY;  Surgeon: Swaziland, Peter M, MD;  Location: [facility] INVASIVE CV LAB;  Service: Cardiovascular;  Laterality: N/A;   CYSTOSCOPY      ENDARTERECTOMY Left 04/24/2016   Procedure: ENDARTERECTOMY LEFT CAROTID;  Surgeon: Pryor Ochoa, MD;  Location: [facility] OR;  Service: Vascular;  Laterality: Left;   EYE SURGERY     FEMORAL ARTERY - POPLITEAL ARTERY BYPASS GRAFT     ICD IMPLANT N/A 12/30/2020   Procedure: ICD IMPLANT;  Surgeon: Marinus Maw, MD;  Location: [facility] INVASIVE  CV LAB;  Service: Cardiovascular;  Laterality: N/A;   JOINT REPLACEMENT     shoulder   LEFT HEART CATH N/A 04/14/2022   Procedure: Left Heart Cath;  Surgeon: Elder Negus, MD;  Location: [facility] INVASIVE CV LAB;  Service: Cardiovascular;  Laterality: N/A;   LEFT HEART CATH AND CORONARY ANGIOGRAPHY N/A 04/24/2022   Procedure: LEFT HEART CATH AND CORONARY ANGIOGRAPHY;  Surgeon: Yates Decamp, MD;  Location: [facility] INVASIVE CV LAB;  Service: Cardiovascular;  Laterality: N/A;   LEFT HEART CATHETERIZATION WITH CORONARY ANGIOGRAM N/A 04/03/2015   Procedure: LEFT HEART CATHETERIZATION WITH CORONARY ANGIOGRAM;  Surgeon: Lennette Bihari, MD;  Location: [facility] CATH LAB;  Service: Cardiovascular;  Laterality: N/A;   LUMBAR LAMINECTOMY  01/06/2013   Procedure: MICRODISCECTOMY LUMBAR LAMINECTOMY;  Surgeon: Eldred Manges, MD;  Location: [facility] OR;  Service: Orthopedics;  Laterality: N/A;  L3-4 decompression   LUMBAR LAMINECTOMY/DECOMPRESSION MICRODISCECTOMY  02/12/2012   Procedure: LUMBAR LAMINECTOMY/DECOMPRESSION MICRODISCECTOMY;  Surgeon: [name], MD;  Location: [facility] NEURO ORS;  Service: Neurosurgery;  Laterality: N/A;  Lumbar four-five laminectomy   PATCH ANGIOPLASTY Left 04/24/2016   Procedure: LEFT CAROTID ARTERY PATCH ANGIOPLASTY;  Surgeon: Pryor Ochoa, MD;  Location: [facility] OR;  Service: Vascular;  Laterality: Left;   RIGHT HEART CATH AND CORONARY/GRAFT ANGIOGRAPHY N/A 01/30/2019   Procedure: RIGHT HEART CATH AND CORONARY/GRAFT ANGIOGRAPHY;  Surgeon: Lennette Bihari, MD;  Location: [facility] INVASIVE CV LAB;  Service: Cardiovascular;  Laterality: N/A;   RIGHT/LEFT HEART CATH AND CORONARY  ANGIOGRAPHY N/A 03/17/2022   Procedure: RIGHT/LEFT HEART CATH AND CORONARY ANGIOGRAPHY;  Surgeon: Elder Negus, MD;  Location: [facility] INVASIVE CV LAB;  Service: Cardiovascular;  Laterality: N/A;   STERIOD INJECTION Right 01/09/2014   Procedure: STEROID INJECTION;  Surgeon: Kathryne Hitch, MD;  Location: [facility] OR;  Service: Orthopedics;  Laterality: Right;   TEE WITHOUT CARDIOVERSION N/A 04/05/2015   Procedure: TRANSESOPHAGEAL ECHOCARDIOGRAM (TEE);  Surgeon: Loreli Slot, MD;  Location: [facility] OR;  Service: Open Heart Surgery;  Laterality: N/A;   TOTAL HIP ARTHROPLASTY Left 01/09/2014   DR Magnus Ivan   TOTAL HIP ARTHROPLASTY Left 01/09/2014   Procedure: LEFT TOTAL HIP ARTHROPLASTY ANTERIOR APPROACH and Steroid Injection Right hip;  Surgeon: Kathryne Hitch, MD;  Location: [facility] OR;  Service: Orthopedics;  Laterality: Left;   TOTAL HIP ARTHROPLASTY Right 08/15/2019   TOTAL HIP ARTHROPLASTY Right 08/15/2019   Procedure: RIGHT TOTAL HIP ARTHROPLASTY ANTERIOR APPROACH;  Surgeon: Kathryne Hitch, MD;  Location: [facility] OR;  Service: Orthopedics;  Laterality: Right;     Family History  Problem Relation Age of Onset   Heart disease Father    Heart attack Father    Heart disease Sister    Hypertension Sister    Heart attack Sister    Hypertension Mother    Diabetes Son      Social History   Socioeconomic History   Marital status: Married    Spouse name: Thiago Cure   Number of children: 2   Years of education: Not on file   Highest education level: 9th grade  Occupational History   Not on file  Tobacco Use   Smoking status: Former    Packs/day: 1.00    Years: 25.00    Additional pack years: 0.00    Total pack years: 25.00    Types: Cigarettes    Quit date: 02/04/1987    Years since quitting: 36.3   Smokeless tobacco: Former    Types: Sports administrator  Quit date: 07/20/2009   Tobacco comments:    quit 35+yrs ago  Vaping Use   Vaping Use: Never used  Substance and Sexual  Activity   Alcohol use: No    Alcohol/week: 0.0 standard drinks of alcohol   Drug use: No   Sexual activity: Not Currently  Other Topics Concern   Not on file  Social History Narrative   Not on file   Social Determinants of Health   Financial Resource Strain: Low Risk  (08/28/2021)   Overall Financial Resource Strain (CARDIA)    Difficulty of Paying Living Expenses: Not hard at all  Food Insecurity: No Food Insecurity (07/08/2022)   Hunger Vital Sign    Worried About Running Out of Food in the Last Year: Never true    Ran Out of Food in the Last Year: Never true  Transportation Needs: No Transportation Needs (07/08/2022)   PRAPARE - Administrator, Civil Service (Medical): No    Lack of Transportation (Non-Medical): No  Physical Activity: Not on file  Stress: Not on file  Social Connections: Not on file  Intimate Partner Violence: Not At Risk (07/08/2022)   Humiliation, Afraid, Rape, and Kick questionnaire    Fear of Current or Ex-Partner: No    Emotionally Abused: No    Physically Abused: No    Sexually Abused: No     BP (!) 106/50   Pulse 75   Ht 5\' 6"  (1.676 m)   Wt 141 lb 9.6 oz (64.2 kg)   SpO2 97%   BMI 22.85 kg/m   Physical Exam:  Well appearing 82 yo man, NAD HEENT: Unremarkable Neck:  No JVD, no thyromegally Lymphatics:  No adenopathy Back:  No CVA tenderness Lungs:  Clear HEART:  Regular rate rhythm, no murmurs, no rubs, no clicks Abd:  soft, positive bowel sounds, no organomegally, no rebound, no guarding Ext:  2 plus pulses, no edema, no cyanosis, no clubbing Skin:  No rashes no nodules Neuro:  CN II through XII intact, motor grossly intact  EKG - atypical atrial flutter  DEVICE  Normal device function.  See PaceArt for details.   Assess/Plan: Lightheaded - his bp is low and I have asked him to reduce ranolazine by half. ICM - he denies anginal symptoms. Chronic systolic heart failure - his symptoms are class 2. VT - he has had 2 ICD  shocks. He is now off of the [facility] and feels better.  Sharlot Gowda Caelin Rosen,MD

## 2023-05-21 LAB — COMPREHENSIVE METABOLIC PANEL
ALT: 8 IU/L (ref 0–44)
AST: 20 IU/L (ref 0–40)
Albumin/Globulin Ratio: 1.5 (ref 1.2–2.2)
Albumin: 3.8 g/dL (ref 3.7–4.7)
Alkaline Phosphatase: 56 IU/L (ref 44–121)
BUN/Creatinine Ratio: 21 (ref 10–24)
BUN: 27 mg/dL (ref 8–27)
Bilirubin Total: 0.6 mg/dL (ref 0.0–1.2)
CO2: 25 mmol/L (ref 20–29)
Calcium: 9.1 mg/dL (ref 8.6–10.2)
Chloride: 97 mmol/L (ref 96–106)
Creatinine, Ser: 1.29 mg/dL — ABNORMAL HIGH (ref 0.76–1.27)
Globulin, Total: 2.6 g/dL (ref 1.5–4.5)
Glucose: 111 mg/dL — ABNORMAL HIGH (ref 70–99)
Potassium: 4.5 mmol/L (ref 3.5–5.2)
Sodium: 138 mmol/L (ref 134–144)
Total Protein: 6.4 g/dL (ref 6.0–8.5)
eGFR: 56 mL/min/{1.73_m2} — ABNORMAL LOW (ref >59)

## 2023-05-21 LAB — PRO B NATRIURETIC PEPTIDE: NT-Pro BNP: 5882 pg/mL — ABNORMAL HIGH (ref 0–486)

## 2023-05-21 LAB — TSH: TSH: 38.5 u[IU]/mL — ABNORMAL HIGH (ref 0.450–4.500)

## 2023-05-26 ENCOUNTER — Ambulatory Visit: Payer: Medicare Other | Admitting: Cardiology

## 2023-05-26 ENCOUNTER — Telehealth: Payer: Self-pay | Admitting: Cardiology

## 2023-05-26 ENCOUNTER — Encounter: Payer: Self-pay | Admitting: Cardiology

## 2023-05-26 VITALS — BP 142/74 | HR 76 | Resp 16 | Ht 66.0 in | Wt 143.0 lb

## 2023-05-26 DIAGNOSIS — I5020 Unspecified systolic (congestive) heart failure: Secondary | ICD-10-CM | POA: Diagnosis not present

## 2023-05-26 DIAGNOSIS — I25810 Atherosclerosis of coronary artery bypass graft(s) without angina pectoris: Secondary | ICD-10-CM | POA: Diagnosis not present

## 2023-05-26 DIAGNOSIS — R7989 Other specified abnormal findings of blood chemistry: Secondary | ICD-10-CM

## 2023-05-26 DIAGNOSIS — I5032 Chronic diastolic (congestive) heart failure: Secondary | ICD-10-CM | POA: Diagnosis not present

## 2023-05-26 MED ORDER — TORSEMIDE 20 MG PO TABS: 20.0000 mg | ORAL_TABLET | Freq: Two times a day (BID) | ORAL | 3 refills | Status: DC

## 2023-05-26 MED ORDER — SACUBITRIL-VALSARTAN 24-26 MG PO TABS: 1.0000 | ORAL_TABLET | Freq: Two times a day (BID) | ORAL | 1 refills | Status: DC

## 2023-05-26 NOTE — Telephone Encounter (Signed)
Spoke with the patient. Let us cancel the cath procedure for now.  Thanks MJP

## 2023-05-26 NOTE — Progress Notes (Addendum)
Patient referred by Cleatis Polka., MD for chest pain, shortness of breath  Subjective:   Ryan [name], male    DOB: [birth year], 82 y.o.   MRN: [id number]   Chief Complaint  Patient presents with   Coronary Artery Disease   HFrEF   Follow-up    4 week     HPI  82 year old caucasian male with CAD s/p CABG (patent LIMA-LAD, occluded SVG-ramus/OM Y graft, occluded SVG-PDA), ischemic cardiomyopathy, s/p ICD, aortic stenosis, persistent Afib, PAD s/p aortobifemoral bypass, mod carotid disease, h/o stroke, CKD III  Patient is here with his wife today.  He states that he is doing well, does not have any overt shortness of breath, also denies any leg edema.  Reviewed recent echocardiogram and test results with the patient, details below.   Current Outpatient Medications:    acetaminophen (TYLENOL) 325 MG tablet, Take 2 tablets (650 mg total) by mouth every 6 (six) hours as needed for mild pain (or Fever >/= 101)., Disp: 20 tablet, Rfl: 0   albuterol (VENTOLIN HFA) 108 (90 Base) MCG/ACT inhaler, Inhale 1 puff into the lungs as needed., Disp: , Rfl:    allopurinol (ZYLOPRIM) 100 MG tablet, Take 100 mg by mouth daily as needed (gout)., Disp: , Rfl:    ALPRAZolam (XANAX) 0.5 MG tablet, Take 0.5 mg by mouth 3 (three) times daily as needed for anxiety or sleep., Disp: , Rfl:    apixaban (ELIQUIS) 2.5 MG TABS tablet, Take 2.5 mg by mouth 2 (two) times daily., Disp: , Rfl:    bisacodyl (DULCOLAX) 5 MG EC tablet, Take 5 mg by mouth daily as needed for moderate constipation., Disp: , Rfl:    bisoprolol (ZEBETA) 5 MG tablet, TAKE 1/2 TABLET BY MOUTH DAILY, Disp: 45 tablet, Rfl: 3   Evolocumab (REPATHA SURECLICK) 140 MG/ML SOAJ, Inject 140 mg into the skin every 14 (fourteen) days., Disp: 2 mL, Rfl: 11   finasteride (PROSCAR) 5 MG tablet, Take 5 mg by mouth in the morning., Disp: , Rfl:    meclizine (ANTIVERT) 12.5 MG tablet, TAKE 1 TABLET BY MOUTH 3 TIMES DAILY AS NEEDED FOR DIZZINESS,  Disp: 60 tablet, Rfl: 1   methocarbamol (ROBAXIN) 500 MG tablet, Take 500 mg by mouth daily as needed for muscle spasms (back pain.)., Disp: , Rfl:    metoCLOPramide (REGLAN) 5 MG tablet, Take 1 tablet (5 mg total) by mouth every 6 (six) hours as needed for nausea., Disp: 15 tablet, Rfl: 0   nitroGLYCERIN (NITROSTAT) 0.4 MG SL tablet, Place 1 tablet (0.4 mg total) under the tongue every 5 (five) minutes as needed for chest pain., Disp: 25 tablet, Rfl: 3   ondansetron (ZOFRAN) 4 MG tablet, Take 4 mg by mouth every 8 (eight) hours as needed for nausea or vomiting., Disp: , Rfl:    pantoprazole (PROTONIX) 40 MG tablet, Take 1 tablet (40 mg total) by mouth daily., Disp: 30 tablet, Rfl: 11   polyethylene glycol powder (GLYCOLAX/MIRALAX) 17 GM/SCOOP powder, Take 17 g by mouth in the morning and at bedtime., Disp: , Rfl:    ranolazine (RANEXA) 500 MG 12 hr tablet, Take 1 tablet (500 mg total) by mouth 2 (two) times daily., Disp: 90 tablet, Rfl: 3   tobramycin (TOBREX) 0.3 % ophthalmic solution, Place 1 drop into the left eye 2 (two) times daily., Disp: , Rfl:    torsemide (DEMADEX) 20 MG tablet, Take 1 tablet (20 mg total) by mouth daily., Disp: 30 tablet,  Rfl: 1  Cardiovascular and other pertinent studies:  EKG 10/29/2022: Atrial fibrillation 67 bpm Diffuse low voltage Nonspecific T-abnormality  Echocardiogram 04/16/2023: Left ventricle cavity is normal in size. Moderate concentric hypertrophy of the left ventricle. Severe global hypokinesis. LVEF 20-25%. Unable to evaluate diastolic function due to atrial fibrillation. Left atrial cavity is severely dilated. Right atrial cavity is moderately dilated. Device lead noted in RA/RV. Trileaflet aortic valve. Moderate aortic valve leaflet calcification. Vmax 2.1 m/sec, mean PG 8 mmHg, AVA 0.9 cm by continuity equation. Possibly low flow low gradient aortic stenosis. Trace aortic regurgitation. Moderate (Grade III) mitral regurgitation. Moderate  tricuspid regurgitation. Estimated pulmonary artery systolic pressure 48 mmHg. Mild pulmonic regurgitation. Previous study on 12/27/2021 reported LVEF 50-55%. Aortic valve Vmax 2.3 m/sec, mean PG 13 mmHg. AVA by continuity equation is 0.9 cm2.  Coronary and bypass graft angiography 01/2019: Severe native coronary artery disease with diffuse proximal to mid LAD stenosis with diffusely diseased native LAD proximally and mid with competitive filling from the LIMA graft; 90% proximal stenosis in the ramus immediate vessel, diffuse stenosis of 60 to 70% in the proximal circumflex with diffuse 80% distal stenosis and total occlusion of the AV groove after marginal vessel with total occlusion of the proximal native RCA.  There is extensive collateralization to the distal RCA both via the circumflex vessel as well as via the LIMA to LAD and distal LAD.   Patent LIMA graft supplying the mid LAD   Previously documented old occlusion of the previous Y graft which had supplied the ramus and marginal vessel in the vein graft which had supplied the PDA.   Low right heart pressures with mean PA pressure at 19 mmHg.   RECOMMENDATION: Increase medical therapy.  The patient has only been on low-dose anti-ischemic medical regimen consisting of bisoprolol 2.5 mg as well as isosorbide 60 mg.  Recommend initiation of amlodipine 5 mg.  Also recommend consideration of Ranexa 500 mg twice a day.  The patient is on rosuvastatin.  However if the patient is to be on atorvastatin, the maximum recommended dose of atorvastatin is 40 mg if the patient is on ranolazine.  Recent labs: 05/20/2023: Glucose 111, BUN/Cr 27/1.29. EGFR 56. Na/K 138/4.5. Rest of the CMP normal ProBNP 5882  02/02/2023: Glucose 95, BUN/Cr 23/1.7. EGFR 38.9. Na/K 138/4.5. Rest of the CMP normal TSH 23.7 high. Free T4 0.7 low  01/06/2023: Glucose 112, BUN/Cr 28/1.71. EGFR 40. Na/K 139/4.2. Rest of the CMP normal  Latest Reference Range & Units 01/28/19  02:51 07/29/20 12:13 12/29/20 02:05 03/22/21 14:59 08/27/21 19:34 09/11/21 03:41 11/23/22 10:38 01/06/23 10:12  TSH 0.450 - 4.500 uIU/mL 39.642 (H) 14.000 (H) 6.705 (H) 7.434 (H) 11.949 (H) 5.069 (H) 21.600 (H) 37.700 (H)  (H): Data is abnormally high  11/23/2022: Glucose 118, BUN/Cr 25/1.46. EGFR 48. Na/K 141/5.0. Rest of the CMP normal TSH 21 high  06/03/2022: Glucose 101, BUN/Cr 15/1.3. EGFR 53. Na/K 134/4.4.  H/H 13/39. MCV 102.7. Platelets 352 HbA1C 5.4% Chol 133, TG 87, HDL 45, LDL 71 TSH 17.9 high. Free T4 0.8 low normal  05/12/2022: BNP 1067  Review of Systems  Cardiovascular:  Negative for chest pain, dyspnea on exertion, leg swelling, palpitations and syncope.  Respiratory:  Negative for shortness of breath.   Musculoskeletal:  Positive for falls.  Gastrointestinal:  Positive for nausea.  Genitourinary:        Self catheterization at home due to urinary retention  Neurological:  Positive for dizziness.  Vitals:   05/26/23 1136  BP: (!) 142/74  Pulse: 76  Resp: 16  SpO2: 97%     Body mass index is 23.08 kg/m. Filed Weights   05/26/23 1136  Weight: 143 lb (64.9 kg)     Objective:   Physical Exam Vitals and nursing note reviewed.  Constitutional:      General: He is not in acute distress. Neck:     Vascular: No JVD.  Cardiovascular:     Rate and Rhythm: Normal rate. Rhythm irregular.     Pulses:          Dorsalis pedis pulses are 0 on the right side and 0 on the left side.       Posterior tibial pulses are 0 on the right side and 1+ on the left side.     Heart sounds: Murmur heard.     Harsh midsystolic murmur is present with a grade of 2/6 at the upper right sternal border radiating to the neck.  Pulmonary:     Effort: Pulmonary effort is normal.     Breath sounds: Normal breath sounds. No wheezing or rales.  Musculoskeletal:     Right lower leg: Edema (1+) present.     Left lower leg: Edema (1+) present.        Assessment &  Recommendations:   82 y/o caucasian male with coronary artery disease status s/p CABG (patent LIMA-LAD, occluded SVG-ramus/OM Y graft), ischemic cardiomyopathy with recovered EF, s/p ICD, mod AS, persistent Afib, PAD s/o aortobifemoral bypass, mod carotid disease, h/o stroke, CKD III, now with chest pain  HFrEF: Mild volume overload. In the past, has not been able to tolerate most of GDMT for heart failure due to dizziness symptoms, which persisted somewhat even after stopping the medications.  Historically, his EF had been around 35%, improved on GDMT, and is now down to 20-25% of most of the GDMT again.  I personally reviewed and compared echocardiograms.  There is minimal change in the aortic valve itself.  Initially, I was contemplating right and left heart catheterization and direct measurement of aortic valve gradients.  However, on further chart review, I think his EF drop is directly correlated to noncompliance with GDMT, owing to side effects.  At this time, I recommend attempting reintroduction of Entresto 24-26 mg twice daily.  We can repeat echocardiogram in 3 months.  If EF remains low, we can then consider if aortic valve could be the cause for low-flow low gradient aortic stenosis.  If his EF is low entirely from ischemic cardiomyopathy, he truly does not have much in terms of further revascularization options, unfortunately.  Continue medical management. Continue bisoprolol 2.5 mg daily.  Increase torsemide to 20 mg twice daily.  Aortic stenosis: Moderate to severe.  See above regarding possibility of low-flow low gradient aortic stenosis.  NSVT: On amiodarone as per Dr. Lubertha Basque recommendations.  Strongly recommend considering levothyroxine given elevated TSH while on amiodarone.  CAD: No angina symptoms on current anti anginal therapy.   Persistent A. fib/atrial flutter: Longstanding.  Rate controlled. High CHA2DS2VAsc score Continue Eliquis 5 mg daily.    CKD stage  III: Stable  PAD: S/p aortobifem bypass.  No critical limb ischemia.  With his recent drop in LVEF, and ongoing medical management to hopefully improve his EF, I suggest we hold off endoscopy for next 3 months, unless it is urgent.  I will forward my note to patient's gastroenterologist Dr. Dulce Sellar.   F/u in 4 weeks  Time  spent: 40-minute   Elder Negus, MD Pager: [phone] Office: [phone]

## 2023-06-01 ENCOUNTER — Ambulatory Visit (HOSPITAL_COMMUNITY): Admission: RE | Admit: 2023-06-01 | Payer: Medicare Other | Source: Home / Self Care | Admitting: Cardiology

## 2023-06-01 ENCOUNTER — Encounter (HOSPITAL_COMMUNITY): Admission: RE | Payer: Self-pay | Source: Home / Self Care

## 2023-06-01 SURGERY — RIGHT/LEFT HEART CATH AND CORONARY ANGIOGRAPHY
Anesthesia: LOCAL

## 2023-06-15 ENCOUNTER — Other Ambulatory Visit: Payer: Self-pay | Admitting: Cardiology

## 2023-06-15 DIAGNOSIS — R42 Dizziness and giddiness: Secondary | ICD-10-CM

## 2023-06-23 ENCOUNTER — Ambulatory Visit: Payer: Medicare Other | Admitting: Cardiology

## 2023-06-30 ENCOUNTER — Ambulatory Visit (INDEPENDENT_AMBULATORY_CARE_PROVIDER_SITE_OTHER): Payer: Medicare Other

## 2023-06-30 DIAGNOSIS — N1831 Chronic kidney disease, stage 3a: Secondary | ICD-10-CM | POA: Diagnosis not present

## 2023-06-30 DIAGNOSIS — I472 Ventricular tachycardia, unspecified: Secondary | ICD-10-CM

## 2023-06-30 DIAGNOSIS — I700 Atherosclerosis of aorta: Secondary | ICD-10-CM | POA: Diagnosis not present

## 2023-06-30 DIAGNOSIS — I130 Hypertensive heart and chronic kidney disease with heart failure and stage 1 through stage 4 chronic kidney disease, or unspecified chronic kidney disease: Secondary | ICD-10-CM | POA: Diagnosis not present

## 2023-06-30 DIAGNOSIS — E785 Hyperlipidemia, unspecified: Secondary | ICD-10-CM | POA: Diagnosis not present

## 2023-06-30 DIAGNOSIS — R972 Elevated prostate specific antigen [PSA]: Secondary | ICD-10-CM | POA: Diagnosis not present

## 2023-06-30 DIAGNOSIS — M109 Gout, unspecified: Secondary | ICD-10-CM | POA: Diagnosis not present

## 2023-06-30 DIAGNOSIS — I5022 Chronic systolic (congestive) heart failure: Secondary | ICD-10-CM | POA: Diagnosis not present

## 2023-06-30 DIAGNOSIS — R7301 Impaired fasting glucose: Secondary | ICD-10-CM | POA: Diagnosis not present

## 2023-06-30 DIAGNOSIS — E039 Hypothyroidism, unspecified: Secondary | ICD-10-CM | POA: Diagnosis not present

## 2023-06-30 DIAGNOSIS — N400 Enlarged prostate without lower urinary tract symptoms: Secondary | ICD-10-CM | POA: Diagnosis not present

## 2023-06-30 LAB — CUP PACEART REMOTE DEVICE CHECK
Battery Remaining Longevity: 162 mo
Battery Remaining Percentage: 100 %
Brady Statistic RV Percent Paced: 0 %
Date Time Interrogation Session: 20240710024600
HighPow Impedance: 67 Ohm
Implantable Lead Connection Status: 753985
Implantable Lead Implant Date: 20220110
Implantable Lead Location: 753860
Implantable Lead Model: 137
Implantable Lead Serial Number: 301176
Implantable Pulse Generator Implant Date: 20220110
Lead Channel Impedance Value: 654 Ohm
Lead Channel Setting Pacing Amplitude: 2.5 V
Lead Channel Setting Pacing Pulse Width: 0.4 ms
Lead Channel Setting Sensing Sensitivity: 0.5 mV
Pulse Gen Serial Number: 211464

## 2023-07-07 ENCOUNTER — Ambulatory Visit: Payer: Medicare Other | Admitting: Cardiology

## 2023-07-08 ENCOUNTER — Ambulatory Visit: Payer: Medicare Other | Admitting: Cardiology

## 2023-07-09 DIAGNOSIS — I700 Atherosclerosis of aorta: Secondary | ICD-10-CM | POA: Diagnosis not present

## 2023-07-09 DIAGNOSIS — N1831 Chronic kidney disease, stage 3a: Secondary | ICD-10-CM | POA: Diagnosis not present

## 2023-07-09 DIAGNOSIS — Z23 Encounter for immunization: Secondary | ICD-10-CM | POA: Diagnosis not present

## 2023-07-09 DIAGNOSIS — I10 Essential (primary) hypertension: Secondary | ICD-10-CM | POA: Diagnosis not present

## 2023-07-09 DIAGNOSIS — R1319 Other dysphagia: Secondary | ICD-10-CM | POA: Diagnosis not present

## 2023-07-09 DIAGNOSIS — I4729 Other ventricular tachycardia: Secondary | ICD-10-CM | POA: Diagnosis not present

## 2023-07-09 DIAGNOSIS — R82998 Other abnormal findings in urine: Secondary | ICD-10-CM | POA: Diagnosis not present

## 2023-07-09 DIAGNOSIS — I25810 Atherosclerosis of coronary artery bypass graft(s) without angina pectoris: Secondary | ICD-10-CM | POA: Diagnosis not present

## 2023-07-09 DIAGNOSIS — Z1339 Encounter for screening examination for other mental health and behavioral disorders: Secondary | ICD-10-CM | POA: Diagnosis not present

## 2023-07-09 DIAGNOSIS — R7301 Impaired fasting glucose: Secondary | ICD-10-CM | POA: Diagnosis not present

## 2023-07-09 DIAGNOSIS — Z0000 Encounter for general adult medical examination without abnormal findings: Secondary | ICD-10-CM | POA: Diagnosis not present

## 2023-07-09 DIAGNOSIS — E039 Hypothyroidism, unspecified: Secondary | ICD-10-CM | POA: Diagnosis not present

## 2023-07-09 DIAGNOSIS — D6869 Other thrombophilia: Secondary | ICD-10-CM | POA: Diagnosis not present

## 2023-07-09 DIAGNOSIS — I480 Paroxysmal atrial fibrillation: Secondary | ICD-10-CM | POA: Diagnosis not present

## 2023-07-09 DIAGNOSIS — Z1331 Encounter for screening for depression: Secondary | ICD-10-CM | POA: Diagnosis not present

## 2023-07-09 DIAGNOSIS — E785 Hyperlipidemia, unspecified: Secondary | ICD-10-CM | POA: Diagnosis not present

## 2023-07-09 DIAGNOSIS — I130 Hypertensive heart and chronic kidney disease with heart failure and stage 1 through stage 4 chronic kidney disease, or unspecified chronic kidney disease: Secondary | ICD-10-CM | POA: Diagnosis not present

## 2023-07-09 DIAGNOSIS — I501 Left ventricular failure: Secondary | ICD-10-CM | POA: Diagnosis not present

## 2023-07-16 ENCOUNTER — Encounter: Payer: Self-pay | Admitting: Cardiology

## 2023-07-16 ENCOUNTER — Ambulatory Visit: Payer: Medicare Other | Admitting: Cardiology

## 2023-07-16 VITALS — BP 101/55 | HR 61 | Resp 16 | Ht 66.0 in | Wt 143.0 lb

## 2023-07-16 DIAGNOSIS — I472 Ventricular tachycardia, unspecified: Secondary | ICD-10-CM

## 2023-07-16 DIAGNOSIS — I5020 Unspecified systolic (congestive) heart failure: Secondary | ICD-10-CM | POA: Diagnosis not present

## 2023-07-16 DIAGNOSIS — R7989 Other specified abnormal findings of blood chemistry: Secondary | ICD-10-CM

## 2023-07-16 DIAGNOSIS — I25810 Atherosclerosis of coronary artery bypass graft(s) without angina pectoris: Secondary | ICD-10-CM

## 2023-07-16 MED ORDER — LOSARTAN POTASSIUM 25 MG PO TABS: 12.5000 mg | ORAL_TABLET | Freq: Every day | ORAL | 3 refills | Status: DC

## 2023-07-16 NOTE — Progress Notes (Signed)
Patient referred by Cleatis Polka., MD for chest pain, shortness of breath  Subjective:   Ryan [name], male    DOB: Jun 09, 1941, 82 y.o.   MRN: [id number]   Chief Complaint  Patient presents with   HFrEF (heart failure with reduced ejection fraction   Follow-up     HPI  82 year old caucasian male with CAD s/p CABG (patent LIMA-LAD, occluded SVG-ramus/OM Y graft, occluded SVG-PDA), ischemic cardiomyopathy, s/p ICD, aortic stenosis, persistent Afib, PAD s/p aortobifemoral bypass, mod carotid disease, h/o stroke, CKD III  Patient continues to have dizziness symptoms. He is only taking Entresto once daily.     Current Outpatient Medications:    albuterol (VENTOLIN HFA) 108 (90 Base) MCG/ACT inhaler, Inhale 1 puff into the lungs as needed., Disp: , Rfl:    allopurinol (ZYLOPRIM) 100 MG tablet, Take 100 mg by mouth daily as needed (gout)., Disp: , Rfl:    ALPRAZolam (XANAX) 0.5 MG tablet, Take 0.5 mg by mouth 3 (three) times daily as needed for anxiety or sleep., Disp: , Rfl:    apixaban (ELIQUIS) 2.5 MG TABS tablet, Take 2.5 mg by mouth 2 (two) times daily., Disp: , Rfl:    bisacodyl (DULCOLAX) 5 MG EC tablet, Take 5 mg by mouth daily as needed for moderate constipation., Disp: , Rfl:    bisoprolol (ZEBETA) 5 MG tablet, TAKE 1/2 TABLET BY MOUTH DAILY, Disp: 45 tablet, Rfl: 3   Evolocumab (REPATHA SURECLICK) 140 MG/ML SOAJ, Inject 140 mg into the skin every 14 (fourteen) days., Disp: 2 mL, Rfl: 11   meclizine (ANTIVERT) 12.5 MG tablet, TAKE 1 TABLET BY MOUTH 3 TIMES A DAY AS NEEDED FOR DIZZINESS (Patient taking differently: Take 25 mg by mouth 3 (three) times daily as needed.), Disp: 60 tablet, Rfl: 1   methocarbamol (ROBAXIN) 500 MG tablet, Take 500 mg by mouth daily as needed for muscle spasms (back pain.)., Disp: , Rfl:    nitroGLYCERIN (NITROSTAT) 0.4 MG SL tablet, Place 1 tablet (0.4 mg total) under the tongue every 5 (five) minutes as needed for chest pain., Disp: 25  tablet, Rfl: 3   ondansetron (ZOFRAN) 4 MG tablet, Take 4 mg by mouth every 8 (eight) hours as needed for nausea or vomiting., Disp: , Rfl:    pantoprazole (PROTONIX) 40 MG tablet, Take 1 tablet (40 mg total) by mouth daily., Disp: 30 tablet, Rfl: 11   polyethylene glycol powder (GLYCOLAX/MIRALAX) 17 GM/SCOOP powder, Take 17 g by mouth in the morning and at bedtime., Disp: , Rfl:    ranolazine (RANEXA) 500 MG 12 hr tablet, Take 1 tablet (500 mg total) by mouth 2 (two) times daily., Disp: 90 tablet, Rfl: 3   sacubitril-valsartan (ENTRESTO) 24-26 MG, Take 1 tablet by mouth 2 (two) times daily., Disp: 60 tablet, Rfl: 1   SYNTHROID 25 MCG tablet, Take 25 mcg by mouth daily., Disp: , Rfl:    tobramycin (TOBREX) 0.3 % ophthalmic solution, Place 1 drop into the left eye 2 (two) times daily., Disp: , Rfl:    torsemide (DEMADEX) 20 MG tablet, Take 1 tablet (20 mg total) by mouth 2 (two) times daily., Disp: 60 tablet, Rfl: 3   acetaminophen (TYLENOL) 325 MG tablet, Take 2 tablets (650 mg total) by mouth every 6 (six) hours as needed for mild pain (or Fever >/= 101). (Patient not taking: Reported on 07/16/2023), Disp: 20 tablet, Rfl: 0  Cardiovascular and other pertinent studies:  EKG 10/29/2022: Atrial fibrillation 67 bpm Diffuse  low voltage Nonspecific T-abnormality  Echocardiogram 04/16/2023: Left ventricle cavity is normal in size. Moderate concentric hypertrophy of the left ventricle. Severe global hypokinesis. LVEF 20-25%. Unable to evaluate diastolic function due to atrial fibrillation. Left atrial cavity is severely dilated. Right atrial cavity is moderately dilated. Device lead noted in RA/RV. Trileaflet aortic valve. Moderate aortic valve leaflet calcification. Vmax 2.1 m/sec, mean PG 8 mmHg, AVA 0.9 cm by continuity equation. Possibly low flow low gradient aortic stenosis. Trace aortic regurgitation. Moderate (Grade III) mitral regurgitation. Moderate tricuspid regurgitation. Estimated  pulmonary artery systolic pressure 48 mmHg. Mild pulmonic regurgitation. Previous study on 12/27/2021 reported LVEF 50-55%. Aortic valve Vmax 2.3 m/sec, mean PG 13 mmHg. AVA by continuity equation is 0.9 cm2.  Coronary and bypass graft angiography 01/2019: Severe native coronary artery disease with diffuse proximal to mid LAD stenosis with diffusely diseased native LAD proximally and mid with competitive filling from the LIMA graft; 90% proximal stenosis in the ramus immediate vessel, diffuse stenosis of 60 to 70% in the proximal circumflex with diffuse 80% distal stenosis and total occlusion of the AV groove after marginal vessel with total occlusion of the proximal native RCA.  There is extensive collateralization to the distal RCA both via the circumflex vessel as well as via the LIMA to LAD and distal LAD.   Patent LIMA graft supplying the mid LAD   Previously documented old occlusion of the previous Y graft which had supplied the ramus and marginal vessel in the vein graft which had supplied the PDA.   Low right heart pressures with mean PA pressure at 19 mmHg.   RECOMMENDATION: Increase medical therapy.  The patient has only been on low-dose anti-ischemic medical regimen consisting of bisoprolol 2.5 mg as well as isosorbide 60 mg.  Recommend initiation of amlodipine 5 mg.  Also recommend consideration of Ranexa 500 mg twice a day.  The patient is on rosuvastatin.  However if the patient is to be on atorvastatin, the maximum recommended dose of atorvastatin is 40 mg if the patient is on ranolazine.  Recent labs: 05/20/2023: Glucose 111, BUN/Cr 27/1.29. EGFR 56. Na/K 138/4.5. Rest of the CMP normal ProBNP 5882  02/02/2023: Glucose 95, BUN/Cr 23/1.7. EGFR 38.9. Na/K 138/4.5. Rest of the CMP normal TSH 23.7 high. Free T4 0.7 low  01/06/2023: Glucose 112, BUN/Cr 28/1.71. EGFR 40. Na/K 139/4.2. Rest of the CMP normal  Latest Reference Range & Units 01/28/19 02:51 07/29/20 12:13 12/29/20 02:05  03/22/21 14:59 08/27/21 19:34 09/11/21 03:41 11/23/22 10:38 01/06/23 10:12  TSH 0.450 - 4.500 uIU/mL 39.642 (H) 14.000 (H) 6.705 (H) 7.434 (H) 11.949 (H) 5.069 (H) 21.600 (H) 37.700 (H)  (H): Data is abnormally high  11/23/2022: Glucose 118, BUN/Cr 25/1.46. EGFR 48. Na/K 141/5.0. Rest of the CMP normal TSH 21 high  06/03/2022: Glucose 101, BUN/Cr 15/1.3. EGFR 53. Na/K 134/4.4.  H/H 13/39. MCV 102.7. Platelets 352 HbA1C 5.4% Chol 133, TG 87, HDL 45, LDL 71 TSH 17.9 high. Free T4 0.8 low normal  05/12/2022: BNP 1067  Review of Systems  Cardiovascular:  Negative for chest pain, dyspnea on exertion, leg swelling, palpitations and syncope.  Respiratory:  Negative for shortness of breath.   Musculoskeletal:  Positive for falls.  Gastrointestinal:  Positive for nausea.  Genitourinary:        Self catheterization at home due to urinary retention  Neurological:  Positive for dizziness.         Vitals:   07/16/23 1417  BP: (!) 101/55  Pulse: 61  Resp: 16  SpO2: 99%   Orthostatic VS for the past 72 hrs (Last 3 readings):  Orthostatic BP Patient Position BP Location Cuff Size Orthostatic Pulse  07/16/23 1438 97/54 Standing Left Arm Normal 68  07/16/23 1437 104/57 Sitting Left Arm Normal 65  07/16/23 1436 103/54 Supine Left Arm Normal 63      Body mass index is 23.08 kg/m. Filed Weights   07/16/23 1417  Weight: 143 lb (64.9 kg)      Objective:   Physical Exam Vitals and nursing note reviewed.  Constitutional:      General: He is not in acute distress. Neck:     Vascular: No JVD.  Cardiovascular:     Rate and Rhythm: Normal rate. Rhythm irregular.     Pulses:          Dorsalis pedis pulses are 0 on the right side and 0 on the left side.       Posterior tibial pulses are 0 on the right side and 1+ on the left side.     Heart sounds: Murmur heard.     Harsh midsystolic murmur is present with a grade of 2/6 at the upper right sternal border radiating to the neck.   Pulmonary:     Effort: Pulmonary effort is normal.     Breath sounds: Normal breath sounds. No wheezing or rales.  Musculoskeletal:     Right lower leg: Edema (1+) present.     Left lower leg: Edema (1+) present.        Assessment & Recommendations:   82 y/o caucasian male with coronary artery disease status s/p CABG (patent LIMA-LAD, occluded SVG-ramus/OM Y graft), ischemic cardiomyopathy with recovered EF, s/p ICD, mod AS, persistent Afib, PAD s/o aortobifemoral bypass, mod carotid disease, h/o stroke, CKD III, now with chest pain  HFrEF: Euvolemic.  He is only taking Entresto once daily. Low normal BP. I do not think he will tolerate Enresto bid. Revert back to losartan 12.5 mg daily. For the same reason, I do not think he will tolerate SGLT2i either.  Check BMP, proBNP, echocardiogram in 2-3 weeks.  Continue bisoprolol 2.5 mg bid, torsemide 20 mg bid.  Continue bisoprolol 2.5 mg daily.  Increase torsemide to 20 mg twice daily.  Aortic stenosis: Moderate to severe.  Will reassess EF and aortic valve gradients on echocardiogram in 2-3 weeks.  NSVT: On amiodarone as per Dr. Lubertha Basque recommendations.  Strongly recommend considering levothyroxine given elevated TSH while on amiodarone.  CAD: No angina symptoms on current anti anginal therapy.   Persistent A. fib/atrial flutter: Longstanding.  Rate controlled. High CHA2DS2VAsc score Continue Eliquis 2.5 mg daily.    CKD stage III: Stable  PAD: S/p aortobifem bypass.  No critical limb ischemia.  F/u in 4 weeks    Elder Negus, MD Pager: [phone] Office: [phone]

## 2023-07-21 NOTE — Progress Notes (Signed)
Remote ICD transmission.   

## 2023-07-28 ENCOUNTER — Other Ambulatory Visit: Payer: Medicare Other

## 2023-07-29 ENCOUNTER — Other Ambulatory Visit: Payer: Medicare Other

## 2023-08-05 ENCOUNTER — Telehealth: Payer: Self-pay

## 2023-08-05 ENCOUNTER — Telehealth (HOSPITAL_COMMUNITY): Payer: Self-pay | Admitting: Internal Medicine

## 2023-08-05 MED ORDER — BISOPROLOL FUMARATE 5 MG PO TABS: 5.0000 mg | ORAL_TABLET | Freq: Every day | ORAL | 3 refills | Status: DC

## 2023-08-05 MED ORDER — RANOLAZINE ER 500 MG PO TB12: 500.0000 mg | ORAL_TABLET | Freq: Every day | ORAL | 3 refills | Status: DC

## 2023-08-05 NOTE — Telephone Encounter (Signed)
Pt c/o medication issue:  1. Name of Medication:   ranolazine (RANEXA) 500 MG 12 hr tablet   2. How are you currently taking this medication (dosage and times per day)?   3. Are you having a reaction (difficulty breathing--STAT)?   4. What is your medication issue?   Caller wants call back to clarify directions for this medication.

## 2023-08-05 NOTE — Telephone Encounter (Signed)
Reviewed with Dr. Ladona Ridgel.  Per Dr. Richardine Service Pt increase bisoprolol to 5 mg one whole tablet by mouth daily.  Call placed to Pt and wife.  Advised of medication change.  Advised that Dr. Rosemary Holms will discuss barostim/CCM therapies at scheduled upcoming appt.  Family indicates understanding.

## 2023-08-05 NOTE — Telephone Encounter (Signed)
Left message for pharmacy informing them patient is taking Ranexa 500mg  -- 0.5 tablets twice daily. Dr. Ladona Ridgel is aware.  Provided office number for callback.

## 2023-08-05 NOTE — Telephone Encounter (Signed)
Alert received from [facility]:  Alert remote transmission: VT with ATP There were 3 episodes that had treated VT, each episode received 5 bursts of ATP, the episodes were irregular in origin, on OAC,  may have started out as AF with RVR, ? dueling arrhythmias.  Sent to triage   Outreach made to Pt.  Per Pt he was moving limbs in the yard during the above episode.  He states he "knows I overdid it".  He felt weak and couldn't breathe.  He states he knows he did not get a shock.  Pt states he feels well today. Pt currently taking bisoprolol 2.5 mg PO daily, Ranexa 250 mg PO BID.  He has been off amiodarone since 04/14/2023.  Advised would forward to Dr. Ladona Ridgel to review and call back with further recommendations.  Pt is also interested in seeing if barostim or CCM therapy would be recommended for him.

## 2023-08-09 DIAGNOSIS — I5020 Unspecified systolic (congestive) heart failure: Secondary | ICD-10-CM | POA: Diagnosis not present

## 2023-08-10 ENCOUNTER — Ambulatory Visit: Payer: Medicare Other

## 2023-08-10 DIAGNOSIS — I5020 Unspecified systolic (congestive) heart failure: Secondary | ICD-10-CM

## 2023-08-10 LAB — CBC
Platelets: 260 10*3/uL (ref 150–450)
WBC: 9.2 10*3/uL (ref 3.4–10.8)

## 2023-08-10 LAB — COMPREHENSIVE METABOLIC PANEL: ALT: 9 IU/L (ref 0–44)

## 2023-08-18 ENCOUNTER — Encounter: Payer: Self-pay | Admitting: Cardiology

## 2023-08-18 ENCOUNTER — Ambulatory Visit: Payer: Medicare Other | Admitting: Cardiology

## 2023-08-18 VITALS — BP 115/69 | HR 73 | Resp 16 | Ht 66.0 in | Wt 141.0 lb

## 2023-08-18 DIAGNOSIS — I4819 Other persistent atrial fibrillation: Secondary | ICD-10-CM

## 2023-08-18 DIAGNOSIS — I5020 Unspecified systolic (congestive) heart failure: Secondary | ICD-10-CM | POA: Diagnosis not present

## 2023-08-18 DIAGNOSIS — I472 Ventricular tachycardia, unspecified: Secondary | ICD-10-CM

## 2023-08-18 DIAGNOSIS — I25810 Atherosclerosis of coronary artery bypass graft(s) without angina pectoris: Secondary | ICD-10-CM

## 2023-08-18 NOTE — Progress Notes (Signed)
Patient referred by Cleatis Polka., MD for chest pain, shortness of breath  Subjective:   Ryan [name], male    DOB: [birth year], 82 y.o.   MRN: [id number]   Chief Complaint  Patient presents with   HFrEF   Follow-up    2-3 week   Results    Labs and echo     HPI  82 year old caucasian male with CAD s/p CABG (patent LIMA-LAD, occluded SVG-ramus/OM Y graft, occluded SVG-PDA), ischemic cardiomyopathy, s/p ICD, aortic stenosis, persistent Afib, PAD s/p aortobifemoral bypass, mod carotid disease, h/o stroke, CKD III  Patient has mild dyspnea with more than usual physical activity, with no recent worsening.,  He denies any chest pain, edema, orthopnea, PND symptoms.  As always, symptoms are primarily due to dizziness and getting nauseated when taking medications.  He had expressed interest in participating in advance heart failure device therapy clinical trials.  After discussion with the EP clinic, with mutually decided to have advanced heart failure clinic evaluate him prior to that.   Current Outpatient Medications:    acetaminophen (TYLENOL) 325 MG tablet, Take 2 tablets (650 mg total) by mouth every 6 (six) hours as needed for mild pain (or Fever >/= 101). (Patient not taking: Reported on 07/16/2023), Disp: 20 tablet, Rfl: 0   albuterol (VENTOLIN HFA) 108 (90 Base) MCG/ACT inhaler, Inhale 1 puff into the lungs as needed., Disp: , Rfl:    allopurinol (ZYLOPRIM) 100 MG tablet, Take 100 mg by mouth daily as needed (gout)., Disp: , Rfl:    ALPRAZolam (XANAX) 0.5 MG tablet, Take 0.5 mg by mouth 3 (three) times daily as needed for anxiety or sleep., Disp: , Rfl:    apixaban (ELIQUIS) 2.5 MG TABS tablet, Take 2.5 mg by mouth 2 (two) times daily., Disp: , Rfl:    bisacodyl (DULCOLAX) 5 MG EC tablet, Take 5 mg by mouth daily as needed for moderate constipation., Disp: , Rfl:    bisoprolol (ZEBETA) 5 MG tablet, Take 1 tablet (5 mg total) by mouth daily., Disp: 90 tablet, Rfl: 3    Evolocumab (REPATHA SURECLICK) 140 MG/ML SOAJ, Inject 140 mg into the skin every 14 (fourteen) days., Disp: 2 mL, Rfl: 11   losartan (COZAAR) 25 MG tablet, Take 0.5 tablets (12.5 mg total) by mouth daily., Disp: 90 tablet, Rfl: 3   meclizine (ANTIVERT) 12.5 MG tablet, TAKE 1 TABLET BY MOUTH 3 TIMES A DAY AS NEEDED FOR DIZZINESS (Patient taking differently: Take 25 mg by mouth 3 (three) times daily as needed.), Disp: 60 tablet, Rfl: 1   methocarbamol (ROBAXIN) 500 MG tablet, Take 500 mg by mouth daily as needed for muscle spasms (back pain.)., Disp: , Rfl:    nitroGLYCERIN (NITROSTAT) 0.4 MG SL tablet, Place 1 tablet (0.4 mg total) under the tongue every 5 (five) minutes as needed for chest pain., Disp: 25 tablet, Rfl: 3   ondansetron (ZOFRAN) 4 MG tablet, Take 4 mg by mouth every 8 (eight) hours as needed for nausea or vomiting., Disp: , Rfl:    pantoprazole (PROTONIX) 40 MG tablet, Take 1 tablet (40 mg total) by mouth daily., Disp: 30 tablet, Rfl: 11   polyethylene glycol powder (GLYCOLAX/MIRALAX) 17 GM/SCOOP powder, Take 17 g by mouth in the morning and at bedtime., Disp: , Rfl:    ranolazine (RANEXA) 500 MG 12 hr tablet, Take 1 tablet (500 mg total) by mouth daily., Disp: 45 tablet, Rfl: 3   SYNTHROID 25 MCG tablet, Take 25  mcg by mouth daily., Disp: , Rfl:    tobramycin (TOBREX) 0.3 % ophthalmic solution, Place 1 drop into the left eye 2 (two) times daily., Disp: , Rfl:    torsemide (DEMADEX) 20 MG tablet, Take 1 tablet (20 mg total) by mouth 2 (two) times daily., Disp: 60 tablet, Rfl: 3  Cardiovascular and other pertinent studies:  EKG 10/29/2022: Atrial fibrillation 67 bpm Diffuse low voltage Nonspecific T-abnormality  Echocardiogram 04/16/2023: Left ventricle cavity is normal in size. Moderate concentric hypertrophy of the left ventricle. Severe global hypokinesis. LVEF 20-25%. Unable to evaluate diastolic function due to atrial fibrillation. Left atrial cavity is severely  dilated. Right atrial cavity is moderately dilated. Device lead noted in RA/RV. Trileaflet aortic valve. Moderate aortic valve leaflet calcification. Vmax 2.1 m/sec, mean PG 8 mmHg, AVA 0.9 cm by continuity equation. Possibly low flow low gradient aortic stenosis. Trace aortic regurgitation. Moderate (Grade III) mitral regurgitation. Moderate tricuspid regurgitation. Estimated pulmonary artery systolic pressure 48 mmHg. Mild pulmonic regurgitation. Previous study on 12/27/2021 reported LVEF 50-55%. Aortic valve Vmax 2.3 m/sec, mean PG 13 mmHg. AVA by continuity equation is 0.9 cm2.  Coronary and bypass graft angiography 01/2019: Severe native coronary artery disease with diffuse proximal to mid LAD stenosis with diffusely diseased native LAD proximally and mid with competitive filling from the LIMA graft; 90% proximal stenosis in the ramus immediate vessel, diffuse stenosis of 60 to 70% in the proximal circumflex with diffuse 80% distal stenosis and total occlusion of the AV groove after marginal vessel with total occlusion of the proximal native RCA.  There is extensive collateralization to the distal RCA both via the circumflex vessel as well as via the LIMA to LAD and distal LAD.   Patent LIMA graft supplying the mid LAD   Previously documented old occlusion of the previous Y graft which had supplied the ramus and marginal vessel in the vein graft which had supplied the PDA.   Low right heart pressures with mean PA pressure at 19 mmHg.   RECOMMENDATION: Increase medical therapy.  The patient has only been on low-dose anti-ischemic medical regimen consisting of bisoprolol 2.5 mg as well as isosorbide 60 mg.  Recommend initiation of amlodipine 5 mg.  Also recommend consideration of Ranexa 500 mg twice a day.  The patient is on rosuvastatin.  However if the patient is to be on atorvastatin, the maximum recommended dose of atorvastatin is 40 mg if the patient is on ranolazine.  Recent  labs: 08/09/2023: Glucose 104, BUN/Cr 29/1.61. EGFR 42. Na/K 139/4.9. Rest of the CMP normal H/H 11.7/34.9. MCV 94. Platelets 260  Latest Reference Range & Units 05/20/23 11:51 08/09/23 11:01  NT-Pro BNP 0 - 486 pg/mL 5,882 (H) 3,916 (H)    05/20/2023: Glucose 111, BUN/Cr 27/1.29. EGFR 56. Na/K 138/4.5. Rest of the CMP normal ProBNP 5882 TSH 38.5 High  Review of Systems  Cardiovascular:  Negative for chest pain, dyspnea on exertion, leg swelling, palpitations and syncope.  Respiratory:  Negative for shortness of breath.   Musculoskeletal:  Positive for falls.  Gastrointestinal:  Positive for nausea.  Genitourinary:        Self catheterization at home due to urinary retention  Neurological:  Positive for dizziness.         Vitals:   08/18/23 1053  BP: 115/69  Pulse: 73  Resp: 16  SpO2: 98%    Body mass index is 22.76 kg/m. Filed Weights   08/18/23 1053  Weight: 141 lb (64 kg)  Objective:   Physical Exam Vitals and nursing note reviewed.  Constitutional:      General: He is not in acute distress. Neck:     Vascular: No JVD.  Cardiovascular:     Rate and Rhythm: Normal rate. Rhythm irregular.     Pulses:          Dorsalis pedis pulses are 0 on the right side and 0 on the left side.       Posterior tibial pulses are 0 on the right side and 1+ on the left side.     Heart sounds: Murmur heard.     Harsh midsystolic murmur is present with a grade of 2/6 at the upper right sternal border radiating to the neck.  Pulmonary:     Effort: Pulmonary effort is normal.     Breath sounds: Normal breath sounds. No wheezing or rales.  Musculoskeletal:     Right lower leg: Edema (1+) present.     Left lower leg: Edema (1+) present.        Assessment & Recommendations:   82 y/o caucasian male with coronary artery disease status s/p CABG (patent LIMA-LAD, occluded SVG-ramus/OM Y graft), ischemic cardiomyopathy with recovered EF, s/p ICD, mod AS, persistent Afib, PAD  s/o aortobifemoral bypass, mod carotid disease, h/o stroke, CKD III, now with chest pain  HFrEF: Ischemic cardiomyopathy NYHA class II s/p ICD Fairly euvolemic on physical exam, the proBNP remains slightly elevated. GDMT for heart failure has been severely limited due to his ability to take medications without having side effects including dizziness and nausea. Tolerating losartan 12.5 mg daily, bisoprolol 2.5 mg bid, torsemide 20 mg bid.  He had expressed interest in participating in advance heart failure device therapy clinical trials.  After discussion with the EP clinic, with mutually decided to have advanced heart failure clinic evaluate him prior to that.  Aortic stenosis: On further reviewing echocardiograms, I do not think this is low-flow low gradient severe arctic stenosis, at worst moderate aortic stenosis. Continue monitoring.  NSVT: On amiodarone as per Dr. Lubertha Basque recommendations.  Has not been taking levothyroxine, due to nausea.  I encouraged him to take it.  He is going to start taking it at night every day.    CAD: No angina symptoms on current anti anginal therapy.   Persistent A. fib/atrial flutter: Longstanding.  Rate controlled. High CHA2DS2VAsc score Continue Eliquis 2.5 mg daily.    CKD stage III: Stable  PAD: S/p aortobifem bypass.  No critical limb ischemia.  F/u in 3 months    Elder Negus, MD Pager: [phone] Office: [phone]

## 2023-08-27 NOTE — Telephone Encounter (Signed)
This encounter was created in error - please disregard.

## 2023-09-07 ENCOUNTER — Other Ambulatory Visit: Payer: Self-pay | Admitting: Cardiology

## 2023-09-07 DIAGNOSIS — R42 Dizziness and giddiness: Secondary | ICD-10-CM

## 2023-09-13 ENCOUNTER — Telehealth: Payer: Self-pay | Admitting: *Deleted

## 2023-09-13 ENCOUNTER — Telehealth: Payer: Self-pay | Admitting: Cardiology

## 2023-09-13 NOTE — Telephone Encounter (Signed)
Noted, await input from Dr. Rosemary Holms as outlined below.

## 2023-09-13 NOTE — Telephone Encounter (Signed)
*  STAT* If patient is at the pharmacy, call can be transferred to refill team.   1. Which medications need to be refilled? (please list name of each medication and dose if known) losartan (COZAAR) 25 MG tablet  2. Which pharmacy/location (including street and city if local pharmacy) is medication to be sent to?  [facility] [id number] - [place], [place] - [address]   3. Do they need a 30 day or 90 day supply?   90 day supply + refills  Patient's wife states she called the pharmacy and they are saying they do not have any refills remaining.

## 2023-09-13 NOTE — Telephone Encounter (Signed)
Patient with diagnosis of afib on Eliquis for anticoagulation.    Procedure: Endoscopy Date of procedure: TBD   CHA2DS2-VASc Score = 7   This indicates a 11.2% annual risk of stroke. The patient's score is based upon: CHF History: 1 HTN History: 1 Diabetes History: 0 Stroke History: 2 Vascular Disease History: 1 Age Score: 2 Gender Score: 0      CrCl 32 ml/min  Per office protocol, patient can hold Eliquis for 2 days prior to procedure.    **This guidance is not considered finalized until pre-operative APP has relayed final recommendations.**

## 2023-09-13 NOTE — Telephone Encounter (Addendum)
Will route to pharm for input on Eliquis.  Per chart review, patient has chronic dyspnea reported as more than usual at last OV as well as chronic dizziness so virtual visit may not be extremely helpful for cardiac clearance for endoscopy. Will route to Dr. Rosemary Holms to find out if he feels comfortable providing recommendation to proceed at higher CV Risk given cardiac history without repeat OV. Dr. Rosemary Holms - Please route response to P CV DIV PREOP (the pre-op pool). Thank you.

## 2023-09-13 NOTE — Telephone Encounter (Signed)
Pre-operative Risk Assessment    Patient Name: Ryan [name]  DOB: 1941/02/06 MRN: [id number]      Request for Surgical Clearance    Procedure:   Endoscopy  Date of Surgery:  Clearance TBD                                 Surgeon:  Dr. Leander Rams Group or Practice Name:  [facility] Phone number:  [phone] Fax number:  [phone]   Type of Clearance Requested:   - Medical  - Pharmacy:  Hold Apixaban (Eliquis) Not indicated.   Type of Anesthesia:   Propofol   Additional requests/questions:    Signed, Emmit Pomfret   09/13/2023, 9:07 AM

## 2023-09-14 NOTE — Telephone Encounter (Signed)
Name: Ryan [name]  DOB: [birth year]  MRN: [id number]   Primary Cardiologist: Elder Negus, MD  Chart reviewed as part of pre-operative protocol coverage.   Per Dr. Rosemary Holms: Agree with anti coagulation recommendations. Elevated cv risk due to underlying ischemic cardiomyopathy, not prohibitive for endoscopy.  Per our pharmD: Per office protocol, patient can hold Eliquis for 2 days prior to procedure.     I will route this recommendation to the requesting party via Epic fax function and remove from pre-op pool. Please call with questions.  Roe Rutherford Amberrose Friebel, PA 09/14/2023, 2:40 PM

## 2023-09-15 ENCOUNTER — Telehealth: Payer: Self-pay | Admitting: Cardiology

## 2023-09-15 NOTE — Telephone Encounter (Signed)
I do not need this encounter 

## 2023-09-15 NOTE — Telephone Encounter (Signed)
*  STAT* If patient is at the pharmacy, call can be transferred to refill team.   1. Which medications need to be refilled? (please list name of each medication and dose if known)    2. Would you like to learn more about the convenience, safety, & potential cost savings by using the [facility]?    3. Are you open to using the [facility] ([facility].      Which pharmacy/location (including street and city if local pharmacy) is medication to be sent to?   5. Do they need a 30 day or 90 day supply? Doctor had said 90 days

## 2023-09-18 ENCOUNTER — Other Ambulatory Visit: Payer: Self-pay | Admitting: Cardiology

## 2023-09-20 ENCOUNTER — Other Ambulatory Visit: Payer: Self-pay

## 2023-09-20 ENCOUNTER — Telehealth: Payer: Self-pay | Admitting: Cardiology

## 2023-09-20 NOTE — Telephone Encounter (Signed)
Pt is stating that Dr. Rosemary Holms told pt to start taking 1 tablet of losartan 25 mg tablets daily. This is not on the pt's medication list. Would Dr. Rosemary Holms like to reorder this medication for pt taking 1 tablet daily? Please address

## 2023-09-20 NOTE — Telephone Encounter (Signed)
I called pt to ask when they changed losartan to the whole tablet. Pt stated he has it figured out and will call back if anything changes.

## 2023-09-20 NOTE — Telephone Encounter (Signed)
*  STAT* If patient is at the pharmacy, call can be transferred to refill team.   1. Which medications need to be refilled? (please list name of each medication and dose if known)  losartan (COZAAR) 25 MG tablet   2. Would you like to learn more about the convenience, safety, & potential cost savings by using the [facility]?   3. Are you open to using the [facility] ([facility].  ).  4. Which pharmacy/location (including street and city if local pharmacy) is medication to be sent to?  [facility] [id number] - [place], [place] - [address]    5. Do they need a 30 day or 90 day supply? 90 day  Patient states the last time he was in his script was changed from 1/2 tablet to a whole tablet.

## 2023-09-21 NOTE — Telephone Encounter (Signed)
I called the patient.  Between he and his wife, they confirmed that he is taking losartan 12.5 mg daily.  Adv that was sent in yesterday.  His wife will pick it up tomorrow.   He wants the doctor to know that he is still interested in hearing about a newer defibrillator that they discussed the option of previously.  Adv to review at next visit in November.

## 2023-09-21 NOTE — Telephone Encounter (Signed)
He was on losartan 12.5 mg daily. We can continue the same, unless he is in fact taking 25 mg daily and tolerating well.  Thanks MJP

## 2023-09-22 DIAGNOSIS — I700 Atherosclerosis of aorta: Secondary | ICD-10-CM | POA: Diagnosis not present

## 2023-09-22 DIAGNOSIS — N1831 Chronic kidney disease, stage 3a: Secondary | ICD-10-CM | POA: Diagnosis not present

## 2023-09-22 DIAGNOSIS — D6869 Other thrombophilia: Secondary | ICD-10-CM | POA: Diagnosis not present

## 2023-09-22 DIAGNOSIS — N400 Enlarged prostate without lower urinary tract symptoms: Secondary | ICD-10-CM | POA: Diagnosis not present

## 2023-09-22 DIAGNOSIS — Z1389 Encounter for screening for other disorder: Secondary | ICD-10-CM | POA: Diagnosis not present

## 2023-09-22 DIAGNOSIS — I480 Paroxysmal atrial fibrillation: Secondary | ICD-10-CM | POA: Diagnosis not present

## 2023-09-22 DIAGNOSIS — E039 Hypothyroidism, unspecified: Secondary | ICD-10-CM | POA: Diagnosis not present

## 2023-09-22 DIAGNOSIS — E785 Hyperlipidemia, unspecified: Secondary | ICD-10-CM | POA: Diagnosis not present

## 2023-09-22 DIAGNOSIS — Z23 Encounter for immunization: Secondary | ICD-10-CM | POA: Diagnosis not present

## 2023-09-22 DIAGNOSIS — R7301 Impaired fasting glucose: Secondary | ICD-10-CM | POA: Diagnosis not present

## 2023-09-22 DIAGNOSIS — Z0000 Encounter for general adult medical examination without abnormal findings: Secondary | ICD-10-CM | POA: Diagnosis not present

## 2023-09-22 DIAGNOSIS — R1319 Other dysphagia: Secondary | ICD-10-CM | POA: Diagnosis not present

## 2023-09-22 DIAGNOSIS — I501 Left ventricular failure: Secondary | ICD-10-CM | POA: Diagnosis not present

## 2023-09-22 DIAGNOSIS — I130 Hypertensive heart and chronic kidney disease with heart failure and stage 1 through stage 4 chronic kidney disease, or unspecified chronic kidney disease: Secondary | ICD-10-CM | POA: Diagnosis not present

## 2023-09-23 ENCOUNTER — Telehealth: Payer: Self-pay | Admitting: Cardiology

## 2023-09-23 NOTE — Telephone Encounter (Signed)
Contacted the patient to seek clarification on the correct dose of the Losartan.   Patient did not answer. Spoke with the patients wife.  The chart states half tablet daily but the wife was giving the patient a whole tab daily. Patients wife calls the office because he needs a refill on Losartan. Wife states she went by the pharmacy and was told they have not received our rx refill. Reviewed chart and rx was sent on Monday 9.20.24. Wife states the rx was changed on Tuesday after the appointment back to half tablet daily. I explained to the wife that I did not see an appt with Dr Rosemary Holms on Tuesday and she states that's when he changed the medication back.    Contacted the pharmacy and was told they did receive the rx for Losartan, but it is not due for pick up until 10/01/23. Pharmacist states she explained this to the patients wife; and confirmed instructions that state the patient is to only take half tab daily.   Contacted the patients wife who states she does understand and that the rx was recently changed back to half a tablet daily. She states one of the pharmacist gave her a 3 tabs as a courtesy but she is out of town and she does not know what she is going to do for 1 day while the patient has no meds. Wife states she will figure something out when she gets back into town.

## 2023-09-23 NOTE — Telephone Encounter (Signed)
If he is tolerating well, please send a prescription of losartan 25 mg daily.  Thanks MJP

## 2023-09-23 NOTE — Telephone Encounter (Signed)
*  STAT* If patient is at the pharmacy, call can be transferred to refill team.   1. Which medications need to be refilled? (please list name of each medication and dose if known)  Losartan- wife said the pharmacist said they did not have this refill   2. Would you like to learn more about the convenience, safety, & potential cost savings by using the [facility]?      3. Are you open to using the [facility] ([facility]. .   4. Which pharmacy/location (including street and city if local pharmacy) is medication to be sent to?[facility], [address], [place]  5. Do they need a 30 day or 90 day supply? 90 days and refills- need this called in today please

## 2023-09-24 ENCOUNTER — Other Ambulatory Visit: Payer: Self-pay

## 2023-09-24 MED ORDER — LOSARTAN POTASSIUM 25 MG PO TABS: 25.0000 mg | ORAL_TABLET | Freq: Every day | ORAL | 3 refills | Status: DC

## 2023-09-24 NOTE — Telephone Encounter (Signed)
Pt's medication was resent to pt's pharmacy as requested. Confirmation received.  °

## 2023-09-28 ENCOUNTER — Other Ambulatory Visit: Payer: Self-pay | Admitting: Cardiovascular Disease

## 2023-09-28 DIAGNOSIS — I214 Non-ST elevation (NSTEMI) myocardial infarction: Secondary | ICD-10-CM

## 2023-09-28 DIAGNOSIS — E785 Hyperlipidemia, unspecified: Secondary | ICD-10-CM

## 2023-09-28 DIAGNOSIS — I25118 Atherosclerotic heart disease of native coronary artery with other forms of angina pectoris: Secondary | ICD-10-CM

## 2023-09-29 ENCOUNTER — Ambulatory Visit: Payer: Medicare Other | Attending: Internal Medicine

## 2023-09-29 DIAGNOSIS — I5042 Chronic combined systolic (congestive) and diastolic (congestive) heart failure: Secondary | ICD-10-CM

## 2023-10-01 LAB — CUP PACEART REMOTE DEVICE CHECK
Battery Remaining Longevity: 138 mo
Battery Remaining Percentage: 98 %
Brady Statistic RV Percent Paced: 0 %
Date Time Interrogation Session: 20241010135600
HighPow Impedance: 64 Ohm
Implantable Lead Connection Status: 753985
Implantable Lead Implant Date: 20220110
Implantable Lead Location: 753860
Implantable Lead Model: 137
Implantable Lead Serial Number: 301176
Implantable Pulse Generator Implant Date: 20220110
Lead Channel Impedance Value: 630 Ohm
Lead Channel Setting Pacing Amplitude: 2.5 V
Lead Channel Setting Pacing Pulse Width: 0.4 ms
Lead Channel Setting Sensing Sensitivity: 0.5 mV
Pulse Gen Serial Number: 211464

## 2023-10-12 ENCOUNTER — Other Ambulatory Visit: Payer: Self-pay | Admitting: Gastroenterology

## 2023-10-19 NOTE — Progress Notes (Signed)
Remote ICD transmission.   

## 2023-10-20 ENCOUNTER — Telehealth: Payer: Self-pay | Admitting: Cardiology

## 2023-10-20 DIAGNOSIS — Z0000 Encounter for general adult medical examination without abnormal findings: Secondary | ICD-10-CM | POA: Diagnosis not present

## 2023-10-20 DIAGNOSIS — R338 Other retention of urine: Secondary | ICD-10-CM | POA: Diagnosis not present

## 2023-10-20 DIAGNOSIS — R110 Nausea: Secondary | ICD-10-CM | POA: Diagnosis not present

## 2023-10-20 DIAGNOSIS — Z0189 Encounter for other specified special examinations: Secondary | ICD-10-CM | POA: Diagnosis not present

## 2023-10-20 DIAGNOSIS — N1831 Chronic kidney disease, stage 3a: Secondary | ICD-10-CM | POA: Diagnosis not present

## 2023-10-20 DIAGNOSIS — I480 Paroxysmal atrial fibrillation: Secondary | ICD-10-CM | POA: Diagnosis not present

## 2023-10-20 DIAGNOSIS — I501 Left ventricular failure: Secondary | ICD-10-CM | POA: Diagnosis not present

## 2023-10-20 DIAGNOSIS — Z1389 Encounter for screening for other disorder: Secondary | ICD-10-CM | POA: Diagnosis not present

## 2023-10-20 DIAGNOSIS — R0789 Other chest pain: Secondary | ICD-10-CM | POA: Diagnosis not present

## 2023-10-20 DIAGNOSIS — N400 Enlarged prostate without lower urinary tract symptoms: Secondary | ICD-10-CM | POA: Diagnosis not present

## 2023-10-20 DIAGNOSIS — R0602 Shortness of breath: Secondary | ICD-10-CM | POA: Diagnosis not present

## 2023-10-20 NOTE — Telephone Encounter (Signed)
Pts wife called back and the pt is seeing his PCP today for possible UTI. He will call and follow up with [place] after his visit.

## 2023-10-20 NOTE — Telephone Encounter (Signed)
Pt c/o swelling: STAT is pt has developed SOB within 24 hours  How much weight have you gained and in what time span?  No significant weight gain  If swelling, where is the swelling located?  Ankles and legs   Are you currently taking a fluid pill?  Yes, twice daily, but wife does not know the name of it   Are you currently SOB?  Wife not currently with patient  Do you have a log of your daily weights (if so, list)?   Have you gained 3 pounds in a day or 5 pounds in a week?   Have you traveled recently?  Came back to town Monday - 1.5 hour car ride, but swelling started today

## 2023-10-20 NOTE — Telephone Encounter (Signed)
Patient's wife states she is returning a missed call from [place], [place].

## 2023-10-20 NOTE — Telephone Encounter (Signed)
Noted.  Can you also follow-up to see if patient has any appointment to see heart failure.  I had referred him to heart failure clinic couple times, but I am not sure he has been seen yet.  Thanks MJP

## 2023-10-20 NOTE — Telephone Encounter (Signed)
I spoke with the pts wife and she reports that the pt has had some weight gain 4 lbs in 2 days and some ankle edema bilaterally.. no pain, no SOB or and no cough.   He can still lay flat to sleep.  He has been having UTI symptoms such as retention, urgency but unable to go... some pain and burning.   His wife called [facility] and they advised him to go to the ED but sh days he is reluctant to go.   I urged her to call his PCP... but if he is not urinating at al.. then he needs to be seen ASAP. She is not physically with him but she will ask him.   I will see about getting his appt with the [facility] Failure Clinic.. Dr Rosemary Holms has placed referral back in September.   Pts wife will call me back and let me know if she was able to get him an appt today.

## 2023-10-25 DIAGNOSIS — R338 Other retention of urine: Secondary | ICD-10-CM | POA: Diagnosis not present

## 2023-10-25 NOTE — Telephone Encounter (Signed)
Pt scheduled with [facility]inc 11/22 @ 940

## 2023-11-02 DIAGNOSIS — I130 Hypertensive heart and chronic kidney disease with heart failure and stage 1 through stage 4 chronic kidney disease, or unspecified chronic kidney disease: Secondary | ICD-10-CM | POA: Diagnosis not present

## 2023-11-02 DIAGNOSIS — I5023 Acute on chronic systolic (congestive) heart failure: Secondary | ICD-10-CM | POA: Diagnosis not present

## 2023-11-02 DIAGNOSIS — E785 Hyperlipidemia, unspecified: Secondary | ICD-10-CM | POA: Diagnosis not present

## 2023-11-02 DIAGNOSIS — N1831 Chronic kidney disease, stage 3a: Secondary | ICD-10-CM | POA: Diagnosis not present

## 2023-11-02 DIAGNOSIS — Z79899 Other long term (current) drug therapy: Secondary | ICD-10-CM | POA: Diagnosis not present

## 2023-11-09 ENCOUNTER — Telehealth: Payer: Self-pay

## 2023-11-09 NOTE — Telephone Encounter (Signed)
Appropriate VT treatment, known ischemic cardiomyopathy HFrEF. He has f/u w/me on 11/21 and Dr. Shirlee Latch on 11.22.   It appears that he is not on amiodarone at this time, he used to be in the past. Please start amiodarone 200 mg bid.  Thanks MJP

## 2023-11-09 NOTE — Telephone Encounter (Signed)
Alert received from [employer] Device alert Antitachycardia pacing (ATP) therapy delivered to convert arrhythmia. Event occurred 11/18 @ 12:29, duration 42sec, HR 169, pace terminated with 1 burst of ATP 1 NSVT event prior   Spoke with pt regarding elevated VT treated with ATP. Pt states he was outside yesterday using his leaf blower during the time of the alert. Pt states he had some SOB but no complaints of palpitations. Pt states he came inside and sat down and the symptoms subsided. Patient states he has had some additional fluid accumulation. Reviewed pt medications with the pts wife. Ranexa 500 MG BID and Zebeta 5 MG per last visit. Per pt's wife patient had only been taking 2.5 MG of Zebeta. Educated pt's wife on the correct dosage for medications. Wife verbalized understanding of medication dosages. Reminded pt and wife of appointment on 11/21 with Dr. Rosemary Holms. Educated pt on the [place] DMV driving restriction post therapy. Pt verbalized understanding.

## 2023-11-10 ENCOUNTER — Ambulatory Visit (HOSPITAL_COMMUNITY): Admission: RE | Admit: 2023-11-10 | Payer: Medicare Other | Source: Home / Self Care | Admitting: Gastroenterology

## 2023-11-10 SURGERY — ESOPHAGOGASTRODUODENOSCOPY (EGD) WITH PROPOFOL
Anesthesia: Monitor Anesthesia Care | Laterality: Bilateral

## 2023-11-11 ENCOUNTER — Encounter: Payer: Self-pay | Admitting: Cardiology

## 2023-11-11 ENCOUNTER — Ambulatory Visit: Payer: Medicare Other | Attending: Cardiology | Admitting: Cardiology

## 2023-11-11 VITALS — BP 110/72 | HR 73 | Resp 16 | Ht 66.0 in | Wt 143.8 lb

## 2023-11-11 DIAGNOSIS — I5020 Unspecified systolic (congestive) heart failure: Secondary | ICD-10-CM | POA: Diagnosis not present

## 2023-11-11 DIAGNOSIS — I25810 Atherosclerosis of coronary artery bypass graft(s) without angina pectoris: Secondary | ICD-10-CM

## 2023-11-11 DIAGNOSIS — Z95810 Presence of automatic (implantable) cardiac defibrillator: Secondary | ICD-10-CM | POA: Diagnosis not present

## 2023-11-11 DIAGNOSIS — I5042 Chronic combined systolic (congestive) and diastolic (congestive) heart failure: Secondary | ICD-10-CM

## 2023-11-11 DIAGNOSIS — E039 Hypothyroidism, unspecified: Secondary | ICD-10-CM | POA: Diagnosis not present

## 2023-11-11 DIAGNOSIS — Z5181 Encounter for therapeutic drug level monitoring: Secondary | ICD-10-CM | POA: Diagnosis not present

## 2023-11-11 DIAGNOSIS — I4819 Other persistent atrial fibrillation: Secondary | ICD-10-CM | POA: Diagnosis not present

## 2023-11-11 DIAGNOSIS — E032 Hypothyroidism due to medicaments and other exogenous substances: Secondary | ICD-10-CM | POA: Diagnosis not present

## 2023-11-11 MED ORDER — AMIODARONE HCL 200 MG PO TABS: 200.0000 mg | ORAL_TABLET | Freq: Every day | ORAL | 3 refills | Status: DC

## 2023-11-11 NOTE — Progress Notes (Signed)
Cardiology Office Note:  .   Date:  11/11/2023  ID:  Ryan [name], DOB [birth year], MRN [id number] PCP: Cleatis Polka., MD  [facility] Providers Cardiologist:  Truett Mainland, MD PCP: Cleatis Polka., MD  Chief Complaint  Patient presents with   HFrEF (heart failure with reduced ejection fraction)    VT (ventricular tachycardia)    Follow-up    3 months      History of Present Illness: .    Ryan [name] is a 82 y.o. male with CAD s/p CABG (patent LIMA-LAD, occluded SVG-ramus/OM Y graft, occluded SVG-PDA), ischemic cardiomyopathy, s/p ICD, NSVT, aortic stenosis, persistent Afib, PAD s/p aortobifemoral bypass, mod carotid disease, h/o stroke, CKD III   Patient recently had an episode of NSVT, I was treated with ATP by his device.  It appears the patient has not been taking amiodarone.  He has had perceived side effects of "feeling sick" on his stomach and dizziness with several medications, including amiodarone.  For this reason, he has not been able to tolerate most of the GDMT for HFrEF.    Patient is able to walk to the mailbox and back couple times a day without any significant shortness of breath.  He does report increased intake of orange juice recently.  He later noticed that this had large amount of sodium.  This correlated with his leg swelling, as well as episodes of VT as described above.  Patient has since quit drinking water and juice and reports that his leg swelling is completely resolved.  Vitals:   11/11/23 1015  BP: 110/72  Pulse: 73  Resp: 16  SpO2: 98%     ROS:  Review of Systems  Cardiovascular:  Negative for chest pain, dyspnea on exertion, leg swelling, palpitations and syncope.     Studies Reviewed: Marland Kitchen        Labs 07/2023: Cr 1.61, eGFR 42 Hb 11.7  04/2023: TSH 38  Echocardiogram 08/10/2023: Left ventricle cavity is normal in size. Moderate concentric hypertrophy of the left ventricle. Severe global hypokinesis. LVEF  25-30%. Unable to evaluate diastolic function due to atrial fibrillation.  Left atrial cavity is severely dilated. Trileaflet aortic valve with moderate aortic valve leaflet calcification. No regurgitation. Mild aortic stenosis. Vmax 2.0 m/sec, mean PG 8 mmHg, AVA 0.7 cm by continuity equation. Cannot exclude low flow low gradient aortic stenosis. Mild (Grade I) mitral regurgitation. Mild tricuspid regurgitation.   No evidence of pulmonary hypertension. Previous study on 04/16/2023 reported estimated PASP 48 mmHg.   Risk Assessment/Calculations:    CHA2DS2-VASc Score = 7  This indicates a 11.2% annual risk of stroke. The patient's score is based upon: CHF History: 1 HTN History: 1 Diabetes History: 0 Stroke History: 2 Vascular Disease History: 1 Age Score: 2 Gender Score: 0      Physical Exam:   Physical Exam Vitals and nursing note reviewed.  Constitutional:      General: He is not in acute distress. Neck:     Vascular: No JVD.  Cardiovascular:     Rate and Rhythm: Normal rate. Rhythm irregular.     Heart sounds: Murmur heard.     Harsh midsystolic murmur is present with a grade of 2/6 at the upper right sternal border radiating to the neck.     High-pitched blowing holosystolic murmur of grade 2/6 is also present at the apex.  Pulmonary:     Effort: Pulmonary effort is normal.     Breath sounds: Normal  breath sounds. No wheezing or rales.  Musculoskeletal:     Right lower leg: No edema.     Left lower leg: No edema.      VISIT DIAGNOSES:   ICD-10-CM   1. Chronic combined systolic and diastolic heart failure (HCC)  V42.59 Comp Met (CMET)    CBC w/Diff    TSH    Pro b natriuretic peptide    amiodarone (PACERONE) 200 MG tablet    2. HFrEF (heart failure with reduced ejection fraction) (HCC)  I50.20 Comp Met (CMET)    CBC w/Diff    TSH    Pro b natriuretic peptide    amiodarone (PACERONE) 200 MG tablet    3. Coronary artery disease involving coronary bypass  graft of native heart without angina pectoris  I25.810 Comp Met (CMET)    CBC w/Diff    TSH    Pro b natriuretic peptide    amiodarone (PACERONE) 200 MG tablet    4. Persistent atrial fibrillation (HCC)  I48.19 Comp Met (CMET)    CBC w/Diff    TSH    Pro b natriuretic peptide    amiodarone (PACERONE) 200 MG tablet    5. ICD (implantable cardioverter-defibrillator) in place  Z95.810 Comp Met (CMET)    CBC w/Diff    TSH    Pro b natriuretic peptide    amiodarone (PACERONE) 200 MG tablet    6. Therapeutic drug monitoring  Z51.81 Comp Met (CMET)    CBC w/Diff    TSH    Pro b natriuretic peptide    amiodarone (PACERONE) 200 MG tablet    7. Hypothyroidism due to medication  E03.2 Comp Met (CMET)    CBC w/Diff    TSH    Pro b natriuretic peptide    amiodarone (PACERONE) 200 MG tablet       ASSESSMENT AND PLAN: .    Ryan [name] is a 82 y.o. male with CAD s/p CABG (patent LIMA-LAD, occluded SVG-ramus/OM Y graft, occluded SVG-PDA), ischemic cardiomyopathy, s/p ICD, NSVT, aortic stenosis, persistent Afib, PAD s/p aortobifemoral bypass, mod carotid disease, h/o stroke, CKD III    HFrEF: Ischemic cardiomyopathy NYHA class II s/p ICD Fairly euvolemic on physical exam. GDMT for heart failure has been severely limited due to his ability to take medications without having side effects including dizziness and nausea. Tolerating losartan 12.5 mg daily, bisoprolol 2.5 mg bid, torsemide 20 mg bid.  I am suspicious of some of his "feeling poorly" symptoms are due to low output state.  It may be reasonable to perform right heart catheterization with invasive hemodynamic assessment. He has an appointment to see heart failure specialist Dr. Elwyn Lade tomorrow.  I will await his input before scheduling this.  Either Dr. Larina Bras or I could perform the stated procedure in the near future.  Check CMP, proBNP today.  NSVT: Ischemic cardiomyopathy with prior history of VT, as ICD in place that  appropriately treated his VT with ATP recently. It is quite possible that recent increase sodium intake could have contributed to his VT episode. Patient was not on amiodarone due to perceived side effects. Have convinced patient to go back on amiodarone 200 mg twice daily. In the past, his TSH was elevated after starting amiodarone.  He is currently on levothyroxine 25 mcg daily.  I will check his TSH today.  Aortic stenosis: Previous echocardiograms were performed at [facility]-most recently on 08/10/2023, personally reviewed by me. While low-flow low gradient severe arctic stenosis cannot  be excluded, I suspect his echocardiogram is moderate at worst.    CAD: No angina symptoms on current anti anginal therapy.    Persistent A. fib/atrial flutter: Longstanding.  Rate controlled. High CHA2DS2VAsc score Continue Eliquis 2.5 mg daily.     CKD stage III: Stable   PAD: S/p aortobifem bypass.  No critical limb ischemia.     Meds ordered this encounter  Medications   amiodarone (PACERONE) 200 MG tablet    Sig: Take 1 tablet (200 mg total) by mouth daily.    Dispense:  90 tablet    Refill:  3     F/u in 3 months  Signed, Elder Negus, MD

## 2023-11-11 NOTE — Patient Instructions (Signed)
Medication Instructions:   START TAKING AMIODARONE 200 MG BY MOUTH TWICE DAILY  *If you need a refill on your cardiac medications before your next appointment, please call your pharmacy*   Lab Work:  TODAY--CMET, CBC W DIFF, TSH, AND PRO-BNP  If you have labs (blood work) drawn today and your tests are completely normal, you will receive your results only by: MyChart Message (if you have MyChart) OR A paper copy in the mail If you have any lab test that is abnormal or we need to change your treatment, we will call you to review the results.    Follow-Up:  3 MONTHS WITH DR. PATWARDHAN OR AN EXTENDER

## 2023-11-12 ENCOUNTER — Ambulatory Visit (HOSPITAL_COMMUNITY)
Admission: RE | Admit: 2023-11-12 | Discharge: 2023-11-12 | Disposition: A | Discharge status: Home / Self Care | Payer: Medicare Other | Source: Ambulatory Visit | Attending: Cardiology | Admitting: Cardiology

## 2023-11-12 ENCOUNTER — Other Ambulatory Visit (HOSPITAL_COMMUNITY): Payer: Self-pay

## 2023-11-12 ENCOUNTER — Encounter (HOSPITAL_COMMUNITY): Payer: Self-pay | Admitting: Cardiology

## 2023-11-12 VITALS — BP 132/68 | HR 68 | Wt 144.0 lb

## 2023-11-12 DIAGNOSIS — I472 Ventricular tachycardia, unspecified: Secondary | ICD-10-CM

## 2023-11-12 DIAGNOSIS — I2510 Atherosclerotic heart disease of native coronary artery without angina pectoris: Secondary | ICD-10-CM | POA: Insufficient documentation

## 2023-11-12 DIAGNOSIS — Z951 Presence of aortocoronary bypass graft: Secondary | ICD-10-CM | POA: Diagnosis not present

## 2023-11-12 DIAGNOSIS — I4821 Permanent atrial fibrillation: Secondary | ICD-10-CM | POA: Diagnosis not present

## 2023-11-12 DIAGNOSIS — Z5181 Encounter for therapeutic drug level monitoring: Secondary | ICD-10-CM | POA: Diagnosis not present

## 2023-11-12 DIAGNOSIS — Z95810 Presence of automatic (implantable) cardiac defibrillator: Secondary | ICD-10-CM | POA: Insufficient documentation

## 2023-11-12 DIAGNOSIS — Z7901 Long term (current) use of anticoagulants: Secondary | ICD-10-CM | POA: Diagnosis not present

## 2023-11-12 DIAGNOSIS — N183 Chronic kidney disease, stage 3 unspecified: Secondary | ICD-10-CM | POA: Diagnosis not present

## 2023-11-12 DIAGNOSIS — I5022 Chronic systolic (congestive) heart failure: Secondary | ICD-10-CM | POA: Insufficient documentation

## 2023-11-12 DIAGNOSIS — I739 Peripheral vascular disease, unspecified: Secondary | ICD-10-CM | POA: Insufficient documentation

## 2023-11-12 DIAGNOSIS — I255 Ischemic cardiomyopathy: Secondary | ICD-10-CM | POA: Insufficient documentation

## 2023-11-12 DIAGNOSIS — I5042 Chronic combined systolic (congestive) and diastolic (congestive) heart failure: Secondary | ICD-10-CM

## 2023-11-12 DIAGNOSIS — I080 Rheumatic disorders of both mitral and aortic valves: Secondary | ICD-10-CM | POA: Diagnosis not present

## 2023-11-12 DIAGNOSIS — Z95820 Peripheral vascular angioplasty status with implants and grafts: Secondary | ICD-10-CM | POA: Diagnosis not present

## 2023-11-12 DIAGNOSIS — I4819 Other persistent atrial fibrillation: Secondary | ICD-10-CM

## 2023-11-12 DIAGNOSIS — I25810 Atherosclerosis of coronary artery bypass graft(s) without angina pectoris: Secondary | ICD-10-CM | POA: Insufficient documentation

## 2023-11-12 DIAGNOSIS — Z79899 Other long term (current) drug therapy: Secondary | ICD-10-CM | POA: Diagnosis not present

## 2023-11-12 DIAGNOSIS — I5032 Chronic diastolic (congestive) heart failure: Secondary | ICD-10-CM

## 2023-11-12 LAB — CBC WITH DIFFERENTIAL/PLATELET
Basophils Absolute: 0 10*3/uL (ref 0.0–0.2)
Basos: 0 %
EOS (ABSOLUTE): 0.1 10*3/uL (ref 0.0–0.4)
Eos: 1 %
Hematocrit: 36.7 % — ABNORMAL LOW (ref 37.5–51.0)
Hemoglobin: 11.6 g/dL — ABNORMAL LOW (ref 13.0–17.7)
Immature Grans (Abs): 0 10*3/uL (ref 0.0–0.1)
Immature Granulocytes: 0 %
Lymphocytes Absolute: 1.4 10*3/uL (ref 0.7–3.1)
Lymphs: 18 %
MCH: 31.1 pg (ref 26.6–33.0)
MCHC: 31.6 g/dL (ref 31.5–35.7)
MCV: 98 fL — ABNORMAL HIGH (ref 79–97)
Monocytes Absolute: 0.9 10*3/uL (ref 0.1–0.9)
Monocytes: 11 %
Neutrophils Absolute: 5.4 10*3/uL (ref 1.4–7.0)
Neutrophils: 70 %
Platelets: 297 10*3/uL (ref 150–450)
RBC: 3.73 x10E6/uL — ABNORMAL LOW (ref 4.14–5.80)
RDW: 12.4 % (ref 11.6–15.4)
WBC: 7.8 10*3/uL (ref 3.4–10.8)

## 2023-11-12 LAB — COMPREHENSIVE METABOLIC PANEL
ALT: 6 [IU]/L (ref 0–44)
AST: 19 [IU]/L (ref 0–40)
Albumin: 3.6 g/dL — ABNORMAL LOW (ref 3.7–4.7)
Alkaline Phosphatase: 89 [IU]/L (ref 44–121)
BUN/Creatinine Ratio: 20 (ref 10–24)
BUN: 27 mg/dL (ref 8–27)
Bilirubin Total: 1 mg/dL (ref 0.0–1.2)
CO2: 23 mmol/L (ref 20–29)
Calcium: 9.4 mg/dL (ref 8.6–10.2)
Chloride: 100 mmol/L (ref 96–106)
Creatinine, Ser: 1.32 mg/dL — ABNORMAL HIGH (ref 0.76–1.27)
Globulin, Total: 2.9 g/dL (ref 1.5–4.5)
Glucose: 113 mg/dL — ABNORMAL HIGH (ref 70–99)
Potassium: 4.5 mmol/L (ref 3.5–5.2)
Sodium: 139 mmol/L (ref 134–144)
Total Protein: 6.5 g/dL (ref 6.0–8.5)
eGFR: 54 mL/min/{1.73_m2} — ABNORMAL LOW (ref >59)

## 2023-11-12 LAB — PRO B NATRIURETIC PEPTIDE: NT-Pro BNP: 4367 pg/mL — ABNORMAL HIGH (ref 0–486)

## 2023-11-12 LAB — TSH: TSH: 25.7 u[IU]/mL — ABNORMAL HIGH (ref 0.450–4.500)

## 2023-11-12 MED ORDER — FARXIGA 10 MG PO TABS: 10.0000 mg | ORAL_TABLET | Freq: Every day | ORAL | 11 refills | Status: DC

## 2023-11-12 NOTE — Telephone Encounter (Signed)
Try 200 mg daily and I can see him back.

## 2023-11-12 NOTE — Patient Instructions (Signed)
START Farxiga 10 mg daily.  CHANGE Torsemide to 40 mg in the morning and 20 mg in the evening for 3 days, then 40 mg daily after that.   You are scheduled for a Cardiac Catheterization on Monday, December 2 with Dr. Marca Ancona.  1. Please arrive at the [facility] (Main Entrance A) at [facility]: [address] [place], [place] [address] at 8:30 AM (This time is 2 hour(s) before your procedure to ensure your preparation).   Free valet parking service is available. You will check in at ADMITTING. The support person will be asked to wait in the waiting room.  It is OK to have someone drop you off and come back when you are ready to be discharged.    Special note: Every effort is made to have your procedure done on time. Please understand that emergencies sometimes delay scheduled procedures.  2. Diet: Do not eat solid foods after midnight.  The patient may have clear liquids until 5am upon the day of the procedure.  3.  Medication instructions in preparation for your procedure:   Contrast Allergy: No  HOLD ELIQUIS THE DAY BEFORE YOUR PROCEDURE 12/12/22/22 AND THE DAY OF THE PROCEDURE.  HOLD Torsemide and Farxiga the day of your procedure.  On the morning of your procedure, take any morning medicines NOT listed above.  You may use sips of water.  5. Plan to go home the same day, you will only stay overnight if medically necessary. 6. Bring a current list of your medications and current insurance cards. 7. You MUST have a responsible person to drive you home. 8. Someone MUST be with you the first 24 hours after you arrive home or your discharge will be delayed. 9. Please wear clothes that are easy to get on and off and wear slip-on shoes.  Please follow up with our heart failure pharmacist as scheduled.  Your physician recommends that you schedule a follow-up appointment in: 6 weeks ** PLEASE CALL THE OFFICE IN MID DECEMBER TO ARRANGE YOUR FOLLOW UP APPOINTMENT. **  If you  have any questions or concerns before your next appointment please send [place] a message through [place] or call our office at [phone].    TO LEAVE A MESSAGE FOR THE NURSE SELECT OPTION 2, PLEASE LEAVE A MESSAGE INCLUDING: YOUR NAME DATE OF BIRTH CALL BACK NUMBER REASON FOR CALL**this is important as we prioritize the call backs  YOU WILL RECEIVE A CALL BACK THE SAME DAY AS LONG AS YOU CALL BEFORE 4:00 PM  At the [facility], you and your health needs are our priority. As part of our continuing mission to provide you with exceptional heart care, we have created designated Provider Care Teams. These Care Teams include your primary Cardiologist (physician) and Advanced Practice Providers (APPs- Physician Assistants and Nurse Practitioners) who all work together to provide you with the care you need, when you need it.   You may see any of the following providers on your designated Care Team at your next follow up: Dr Arvilla Meres Dr Marca Ancona Dr. Dorthula Nettles Dr. Clearnce Hasten Amy Filbert Schilder, NP Robbie Lis, [place] [facility], [place] Brynda Peon, NP [place] Lee, NP Karle Plumber, PharmD   Please be sure to bring in all your medications bottles to every appointment.    Thank you for choosing [facility]

## 2023-11-14 NOTE — Progress Notes (Signed)
PCP: Cleatis Polka., MD Cardiology: Dr. Rosemary Holms HF Cardiology: Dr. Shirlee Latch  82 y.o. with history of CAD s/p CABG, ischemic cardiomyopathy, permanent atrial fibrillation, PAD, and CKD stage 3 was referred by Dr. Rosemary Holms for evaluation of CHF.  Patient is s/p CABG, last cath in 5/23 showed occluded native vessels with patent LIMA-LAD (occluded SVG-ramus and SVG-PDA), no interventional target.  Last echo in 8/24 showed EF 25-30%, moderate LVH, severe LV dilation, mild aortic stenosis, mild MR.  Patient has a Environmental [employer] ICD.  He has had atrial fibrillation that appears to be permanent; all ECGs since 2021 show atrial fibrillation.  He has PAD and is s/p aortofemoral bypass.  He has had VT episodes, the last was on 11/08/23.   When patient had the 11/08/23 VT episode, he was blowing leaves.  It was terminated by ATP.  He reports shortness of breath while blowing the leaves and felt tachycardia but no lightheadedness/syncope.  He was started back on amiodrone yesterday (had been on it in the past, then stopped). Generally, he gets short of breath walking about 400 feet (the length of his driveway and back to his house).  He is short of breath with stairs.  No problems with ADLs or walking around the house. Rare lightheadedness with standing.  No orthopnea/PND.  He has some trouble with his balance and has had falls.   HeartLogic 39  Labs (8/24): K 4.9, creatinine 1.6, pro-BNP 3916  ECG (personally reviewed): Atrial fibrillation, QTc 478, nonspecific T wave changes  PMH: 1. PAD: H/o aortobifemoral bypass 2. CVA 3. CKD stage 3 4. Gout 5. Hypothyroidism 6. Hyperlipidemia 7. VT: Amiodarone use. 8. Atrial fibrillation: Permanent, in atrial fibrillation since 2021.  9. CAD: S/p CABG.  - LHC (5/23): 99% prox/mid LCx (subtotal occlusion), occluded mid LAD, 95% D1, occluded RCA, occluded SVG-PDA, occluded SVG-ramus, patent LIMA-LAD with collaterals from LAD to RCA and LCx territories.  10.  Chronic systolic CHF: Ischemic cardiomyopathy: [employer] ICD.  - Echo (8/24) with EF 25-30%, moderate LVH, severe LV dilation, mild aortic stenosis, mild MR.  11. Aortic stenosis: Mild on 8/24 echo.   SH: Married, no smoking/ETOH.  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   Family History  Problem Relation Age of Onset   Heart disease Father    Heart attack Father    Heart disease Sister    Hypertension Sister    Heart attack Sister    Hypertension Mother    Diabetes Son    ROS: All systems reviewed and negative except as per HPI.   Current Outpatient Medications  Medication Sig Dispense Refill   acetaminophen (TYLENOL) 325 MG tablet Take 2 tablets (650 mg total) by mouth every 6 (six) hours as needed for mild pain (or Fever >/= 101). (Patient taking differently: Take 325 mg by mouth every 6 (six) hours as needed for mild pain (pain score 1-3) (or Fever >/= 101).) 20 tablet 0   albuterol (VENTOLIN HFA) 108 (90 Base) MCG/ACT inhaler Inhale 1 puff into the lungs every 6 (six) hours as needed for wheezing or shortness of breath.     allopurinol (ZYLOPRIM) 100 MG tablet Take 100 mg by mouth daily as needed (gout).     ALPRAZolam (XANAX) 0.5 MG tablet Take 0.5 mg by mouth 3 (three) times daily as needed for anxiety or sleep.     amiodarone (PACERONE) 200 MG tablet Take 1 tablet (200 mg total) by mouth daily. 90 tablet 3  apixaban (ELIQUIS) 5 MG TABS tablet Take 2.5 mg by mouth 2 (two) times daily.     bisacodyl (DULCOLAX) 5 MG EC tablet Take 5 mg by mouth daily.     bisoprolol (ZEBETA) 5 MG tablet Take 1 tablet (5 mg total) by mouth daily. 90 tablet 3   Evolocumab (REPATHA SURECLICK) 140 MG/ML SOAJ INJECT 140 MILLIGRAMS INTO THE SKIN EVERY 14 DAYS 6 mL 1   FARXIGA 10 MG TABS tablet Take 1 tablet (10 mg total) by mouth daily before breakfast. 30 tablet 11   losartan (COZAAR) 25 MG tablet Take 1 tablet (25 mg total) by mouth daily. 90 tablet 3   meclizine (ANTIVERT)  12.5 MG tablet Take 2 tablets (25 mg total) by mouth 3 (three) times daily as needed. 90 tablet 3   methocarbamol (ROBAXIN) 500 MG tablet Take 500 mg by mouth daily as needed for muscle spasms (back pain.).     nitroGLYCERIN (NITROSTAT) 0.4 MG SL tablet Place 1 tablet (0.4 mg total) under the tongue every 5 (five) minutes as needed for chest pain. 25 tablet 3   pantoprazole (PROTONIX) 40 MG tablet Take 1 tablet (40 mg total) by mouth daily. 30 tablet 11   polyethylene glycol powder (GLYCOLAX/MIRALAX) 17 GM/SCOOP powder Take 17 g by mouth at bedtime.     ranolazine (RANEXA) 500 MG 12 hr tablet Take 1 tablet (500 mg total) by mouth daily. (Patient taking differently: Take 250 mg by mouth 2 (two) times daily.) 45 tablet 3   SYNTHROID 25 MCG tablet Take 25 mcg by mouth daily. (Patient not taking: Reported on 11/12/2023)     tobramycin (TOBREX) 0.3 % ophthalmic solution Place 1 drop into the left eye daily as needed (Dry eyes).     torsemide (DEMADEX) 20 MG tablet Take 1 tablet (20 mg total) by mouth 2 (two) times daily. 60 tablet 3   No current facility-administered medications for this encounter.   BP 132/68   Pulse 68   Wt 65.3 kg (144 lb)   SpO2 99%   BMI 23.24 kg/m  General: NAD Neck: JVP 8-9 cm with HJR, no thyromegaly or thyroid nodule.  Lungs: Clear to auscultation bilaterally with normal respiratory effort. CV: Nondisplaced PMI.  Heart irregular S1/S2, no S3/S4, 2/6 SEM RUSB with clear S2.  No peripheral edema.  No carotid bruit.  Normal pedal pulses.  Abdomen: Soft, nontender, no hepatosplenomegaly, no distention.  Skin: Intact without lesions or rashes.  Neurologic: Alert and oriented x 3.  Psych: Normal affect. Extremities: No clubbing or cyanosis.  HEENT: Normal.   Assessment/Plan: 1. Chronic systolic CHF: Ischemic cardiomyopathy.  [employer] ICD.  Echo in 8/24 showed EF 25-30%, moderate LVH, severe LV dilation, mild aortic stenosis, mild MR.  He is volume overloaded  today by exam and [facility].  NYHA class III.   - Increase torsemide to 40 qam/20 qpm x 3 days then back to 40 daily.  - He failed Jardiance in the past due to nausea, I am going to try him on Farxiga 10 mg daily.  BMET/BNP today and BMET in 10 days.  - Continue losartan 25 mg daily.  - Continue bisoprolol 5 mg daily.  - Start spironolactone as long as K stays in normal range (upper normal when last checked).  - I will arrange for RHC to assess filling pressures and cardiac output. We discussed risks/benefits and he agrees to the procedure.  He will hold Eliquis the day before and day of procedure.  -  Based on advanced age, probably would not be LVAD candidate.  However, I think we should consider barostimulation vs cardiac contractility modulator, will discuss with him after RHC and volume optimization.  2. CAD: s/p CABG.  Cath in 5/23 without interventional option.  Severe disease of native vessels, occluded SVG-ramus and SVG-PDA, patent LIMA-LAD. No chest pain.  - No ASA given Eliquis use.  - Continue Repatha.  3. Atrial fibrillation: Present since 2021, appears permanent. Reasonable rate control.  - Continue apixaban, dosed at 2.5 bid for age > 80, creatinine > 1.5.  4. CKD stage 3: BMET today.  5. VT: Last episode in 11/24 terminated by ATP.  He started on amiodarone yesterday.  - Continue amiodarone 200 mg daily.  Follow LFTs and TSH, will need regular eye exam.  6. PAD: H/o aortobifemoral bypass.  He denies claudication.   Followup with HF pharmacist in 3 wks for med titration, see me in 6 wks.   Ryan [name] 11/14/2023  -

## 2023-11-14 NOTE — H&P (View-Only) (Signed)
 PCP: Cleatis Polka., MD Cardiology: Dr. Rosemary Holms HF Cardiology: Dr. Shirlee Latch  82 y.o. with history of CAD s/p CABG, ischemic cardiomyopathy, permanent atrial fibrillation, PAD, and CKD stage 3 was referred by Dr. Rosemary Holms for evaluation of CHF.  Patient is s/p CABG, last cath in 5/23 showed occluded native vessels with patent LIMA-LAD (occluded SVG-ramus and SVG-PDA), no interventional target.  Last echo in 8/24 showed EF 25-30%, moderate LVH, severe LV dilation, mild aortic stenosis, mild MR.  Patient has a [employer] ICD.  He has had atrial fibrillation that appears to be permanent; all ECGs since 2021 show atrial fibrillation.  He has PAD and is s/p aortofemoral bypass.  He has had VT episodes, the last was on 11/08/23.   When patient had the 11/08/23 VT episode, he was blowing leaves.  It was terminated by ATP.  He reports shortness of breath while blowing the leaves and felt tachycardia but no lightheadedness/syncope.  He was started back on amiodrone yesterday (had been on it in the past, then stopped). Generally, he gets short of breath walking about 400 feet (the length of his driveway and back to his house).  He is short of breath with stairs.  No problems with ADLs or walking around the house. Rare lightheadedness with standing.  No orthopnea/PND.  He has some trouble with his balance and has had falls.   HeartLogic 39  Labs (8/24): K 4.9, creatinine 1.6, pro-BNP 3916  ECG (personally reviewed): Atrial fibrillation, QTc 478, nonspecific T wave changes  PMH: 1. PAD: H/o aortobifemoral bypass 2. CVA 3. CKD stage 3 4. Gout 5. Hypothyroidism 6. Hyperlipidemia 7. VT: Amiodarone use. 8. Atrial fibrillation: Permanent, in atrial fibrillation since 2021.  9. CAD: S/p CABG.  - LHC (5/23): 99% prox/mid LCx (subtotal occlusion), occluded mid LAD, 95% D1, occluded RCA, occluded SVG-PDA, occluded SVG-ramus, patent LIMA-LAD with collaterals from LAD to RCA and LCx territories.  10.  Chronic systolic CHF: Ischemic cardiomyopathy: [employer] ICD.  - Echo (8/24) with EF 25-30%, moderate LVH, severe LV dilation, mild aortic stenosis, mild MR.  11. Aortic stenosis: Mild on 8/24 echo.   SH: Married, no smoking/ETOH.  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   Family History  Problem Relation Age of Onset   Heart disease Father    Heart attack Father    Heart disease Sister    Hypertension Sister    Heart attack Sister    Hypertension Mother    Diabetes Son    ROS: All systems reviewed and negative except as per HPI.   Current Outpatient Medications  Medication Sig Dispense Refill   acetaminophen (TYLENOL) 325 MG tablet Take 2 tablets (650 mg total) by mouth every 6 (six) hours as needed for mild pain (or Fever >/= 101). (Patient taking differently: Take 325 mg by mouth every 6 (six) hours as needed for mild pain (pain score 1-3) (or Fever >/= 101).) 20 tablet 0   albuterol (VENTOLIN HFA) 108 (90 Base) MCG/ACT inhaler Inhale 1 puff into the lungs every 6 (six) hours as needed for wheezing or shortness of breath.     allopurinol (ZYLOPRIM) 100 MG tablet Take 100 mg by mouth daily as needed (gout).     ALPRAZolam (XANAX) 0.5 MG tablet Take 0.5 mg by mouth 3 (three) times daily as needed for anxiety or sleep.     amiodarone (PACERONE) 200 MG tablet Take 1 tablet (200 mg total) by mouth daily. 90 tablet 3  apixaban (ELIQUIS) 5 MG TABS tablet Take 2.5 mg by mouth 2 (two) times daily.     bisacodyl (DULCOLAX) 5 MG EC tablet Take 5 mg by mouth daily.     bisoprolol (ZEBETA) 5 MG tablet Take 1 tablet (5 mg total) by mouth daily. 90 tablet 3   Evolocumab (REPATHA SURECLICK) 140 MG/ML SOAJ INJECT 140 MILLIGRAMS INTO THE SKIN EVERY 14 DAYS 6 mL 1   FARXIGA 10 MG TABS tablet Take 1 tablet (10 mg total) by mouth daily before breakfast. 30 tablet 11   losartan (COZAAR) 25 MG tablet Take 1 tablet (25 mg total) by mouth daily. 90 tablet 3   meclizine (ANTIVERT)  12.5 MG tablet Take 2 tablets (25 mg total) by mouth 3 (three) times daily as needed. 90 tablet 3   methocarbamol (ROBAXIN) 500 MG tablet Take 500 mg by mouth daily as needed for muscle spasms (back pain.).     nitroGLYCERIN (NITROSTAT) 0.4 MG SL tablet Place 1 tablet (0.4 mg total) under the tongue every 5 (five) minutes as needed for chest pain. 25 tablet 3   pantoprazole (PROTONIX) 40 MG tablet Take 1 tablet (40 mg total) by mouth daily. 30 tablet 11   polyethylene glycol powder (GLYCOLAX/MIRALAX) 17 GM/SCOOP powder Take 17 g by mouth at bedtime.     ranolazine (RANEXA) 500 MG 12 hr tablet Take 1 tablet (500 mg total) by mouth daily. (Patient taking differently: Take 250 mg by mouth 2 (two) times daily.) 45 tablet 3   SYNTHROID 25 MCG tablet Take 25 mcg by mouth daily. (Patient not taking: Reported on 11/12/2023)     tobramycin (TOBREX) 0.3 % ophthalmic solution Place 1 drop into the left eye daily as needed (Dry eyes).     torsemide (DEMADEX) 20 MG tablet Take 1 tablet (20 mg total) by mouth 2 (two) times daily. 60 tablet 3   No current facility-administered medications for this encounter.   BP 132/68   Pulse 68   Wt 65.3 kg (144 lb)   SpO2 99%   BMI 23.24 kg/m  General: NAD Neck: JVP 8-9 cm with HJR, no thyromegaly or thyroid nodule.  Lungs: Clear to auscultation bilaterally with normal respiratory effort. CV: Nondisplaced PMI.  Heart irregular S1/S2, no S3/S4, 2/6 SEM RUSB with clear S2.  No peripheral edema.  No carotid bruit.  Normal pedal pulses.  Abdomen: Soft, nontender, no hepatosplenomegaly, no distention.  Skin: Intact without lesions or rashes.  Neurologic: Alert and oriented x 3.  Psych: Normal affect. Extremities: No clubbing or cyanosis.  HEENT: Normal.   Assessment/Plan: 1. Chronic systolic CHF: Ischemic cardiomyopathy.  [employer] ICD.  Echo in 8/24 showed EF 25-30%, moderate LVH, severe LV dilation, mild aortic stenosis, mild MR.  He is volume overloaded  today by exam and [facility]ic.  NYHA class III.   - Increase torsemide to 40 qam/20 qpm x 3 days then back to 40 daily.  - He failed Jardiance in the past due to nausea, I am going to try him on Farxiga 10 mg daily.  BMET/BNP today and BMET in 10 days.  - Continue losartan 25 mg daily.  - Continue bisoprolol 5 mg daily.  - Start spironolactone as long as K stays in normal range (upper normal when last checked).  - I will arrange for RHC to assess filling pressures and cardiac output. We discussed risks/benefits and he agrees to the procedure.  He will hold Eliquis the day before and day of procedure.  -  Based on advanced age, probably would not be LVAD candidate.  However, I think we should consider barostimulation vs cardiac contractility modulator, will discuss with him after RHC and volume optimization.  2. CAD: s/p CABG.  Cath in 5/23 without interventional option.  Severe disease of native vessels, occluded SVG-ramus and SVG-PDA, patent LIMA-LAD. No chest pain.  - No ASA given Eliquis use.  - Continue Repatha.  3. Atrial fibrillation: Present since 2021, appears permanent. Reasonable rate control.  - Continue apixaban, dosed at 2.5 bid for age > 80, creatinine > 1.5.  4. CKD stage 3: BMET today.  5. VT: Last episode in 11/24 terminated by ATP.  He started on amiodarone yesterday.  - Continue amiodarone 200 mg daily.  Follow LFTs and TSH, will need regular eye exam.  6. PAD: H/o aortobifemoral bypass.  He denies claudication.   Followup with HF pharmacist in 3 wks for med titration, see me in 6 wks.   Marca Ancona 11/14/2023  -

## 2023-11-15 NOTE — Telephone Encounter (Signed)
Per Aggie Hacker LPN patient was made aware of Amiodarone medication changes on last OV.

## 2023-11-17 NOTE — Telephone Encounter (Signed)
Pt agreeable to both appointments after explanation. Called wife who he said was the "calendar queen" and confirmed time w/ her for 12/17 12PM w/ GT.

## 2023-11-22 ENCOUNTER — Other Ambulatory Visit: Payer: Self-pay

## 2023-11-22 ENCOUNTER — Other Ambulatory Visit (HOSPITAL_COMMUNITY): Payer: Self-pay | Admitting: *Deleted

## 2023-11-22 ENCOUNTER — Encounter (HOSPITAL_COMMUNITY)
Admission: RE | Disposition: A | Discharge status: Home / Self Care | Payer: Self-pay | Source: Home / Self Care | Attending: Cardiology

## 2023-11-22 ENCOUNTER — Other Ambulatory Visit (HOSPITAL_COMMUNITY): Payer: Self-pay

## 2023-11-22 ENCOUNTER — Encounter (HOSPITAL_COMMUNITY): Payer: Self-pay | Admitting: Cardiology

## 2023-11-22 ENCOUNTER — Ambulatory Visit (HOSPITAL_COMMUNITY)
Admission: RE | Admit: 2023-11-22 | Discharge: 2023-11-22 | Disposition: A | Discharge status: Home / Self Care | Payer: Medicare Other | Attending: Cardiology | Admitting: Cardiology

## 2023-11-22 DIAGNOSIS — I472 Ventricular tachycardia, unspecified: Secondary | ICD-10-CM | POA: Diagnosis not present

## 2023-11-22 DIAGNOSIS — I350 Nonrheumatic aortic (valve) stenosis: Secondary | ICD-10-CM | POA: Diagnosis not present

## 2023-11-22 DIAGNOSIS — I5042 Chronic combined systolic (congestive) and diastolic (congestive) heart failure: Secondary | ICD-10-CM

## 2023-11-22 DIAGNOSIS — I509 Heart failure, unspecified: Secondary | ICD-10-CM

## 2023-11-22 DIAGNOSIS — I255 Ischemic cardiomyopathy: Secondary | ICD-10-CM | POA: Diagnosis not present

## 2023-11-22 DIAGNOSIS — Z7901 Long term (current) use of anticoagulants: Secondary | ICD-10-CM | POA: Diagnosis not present

## 2023-11-22 DIAGNOSIS — Z951 Presence of aortocoronary bypass graft: Secondary | ICD-10-CM | POA: Insufficient documentation

## 2023-11-22 DIAGNOSIS — Z95810 Presence of automatic (implantable) cardiac defibrillator: Secondary | ICD-10-CM | POA: Diagnosis not present

## 2023-11-22 DIAGNOSIS — I2510 Atherosclerotic heart disease of native coronary artery without angina pectoris: Secondary | ICD-10-CM | POA: Insufficient documentation

## 2023-11-22 DIAGNOSIS — I5022 Chronic systolic (congestive) heart failure: Secondary | ICD-10-CM | POA: Diagnosis not present

## 2023-11-22 DIAGNOSIS — I4821 Permanent atrial fibrillation: Secondary | ICD-10-CM | POA: Insufficient documentation

## 2023-11-22 DIAGNOSIS — N183 Chronic kidney disease, stage 3 unspecified: Secondary | ICD-10-CM | POA: Insufficient documentation

## 2023-11-22 DIAGNOSIS — Z79899 Other long term (current) drug therapy: Secondary | ICD-10-CM | POA: Insufficient documentation

## 2023-11-22 DIAGNOSIS — I739 Peripheral vascular disease, unspecified: Secondary | ICD-10-CM | POA: Insufficient documentation

## 2023-11-22 DIAGNOSIS — I5020 Unspecified systolic (congestive) heart failure: Secondary | ICD-10-CM

## 2023-11-22 HISTORY — PX: RIGHT HEART CATH: CATH118263

## 2023-11-22 LAB — BASIC METABOLIC PANEL
Anion gap: 10 (ref 5–15)
BUN: 36 mg/dL — ABNORMAL HIGH (ref 8–23)
CO2: 26 mmol/L (ref 22–32)
Calcium: 9.2 mg/dL (ref 8.9–10.3)
Chloride: 103 mmol/L (ref 98–111)
Creatinine, Ser: 1.46 mg/dL — ABNORMAL HIGH (ref 0.61–1.24)
GFR, Estimated: 48 mL/min — ABNORMAL LOW (ref >60)
Glucose, Bld: 135 mg/dL — ABNORMAL HIGH (ref 70–99)
Potassium: 3.6 mmol/L (ref 3.5–5.1)
Sodium: 139 mmol/L (ref 135–145)

## 2023-11-22 LAB — POCT I-STAT EG7
Acid-Base Excess: 4 mmol/L — ABNORMAL HIGH (ref 0.0–2.0)
Bicarbonate: 26.8 mmol/L (ref 20.0–28.0)
Calcium, Ion: 1.16 mmol/L (ref 1.15–1.40)
HCT: 37 % — ABNORMAL LOW (ref 39.0–52.0)
Hemoglobin: 12.6 g/dL — ABNORMAL LOW (ref 13.0–17.0)
O2 Saturation: 66 %
Potassium: 3.8 mmol/L (ref 3.5–5.1)
Sodium: 140 mmol/L (ref 135–145)
TCO2: 28 mmol/L (ref 22–32)
pCO2, Ven: 32.8 mm[Hg] — ABNORMAL LOW (ref 44–60)
pH, Ven: 7.52 — ABNORMAL HIGH (ref 7.25–7.43)
pO2, Ven: 30 mm[Hg] — CL (ref 32–45)

## 2023-11-22 SURGERY — RIGHT HEART CATH
Anesthesia: LOCAL

## 2023-11-22 MED ORDER — LABETALOL HCL 5 MG/ML IV SOLN: 10.0000 mg | INTRAVENOUS | Status: DC | PRN

## 2023-11-22 MED ORDER — SODIUM CHLORIDE 0.9% FLUSH: 3.0000 mL | Freq: Two times a day (BID) | INTRAVENOUS | Status: DC

## 2023-11-22 MED ORDER — DIGOXIN 125 MCG PO TABS: 0.0625 mg | ORAL_TABLET | Freq: Every day | ORAL | 11 refills | Status: DC

## 2023-11-22 MED ORDER — LIDOCAINE HCL (PF) 1 % IJ SOLN
INTRAMUSCULAR | Status: DC | PRN
  Administered 2023-11-22: 2 mL

## 2023-11-22 MED ORDER — HEPARIN (PORCINE) IN NACL 1000-0.9 UT/500ML-% IV SOLN
INTRAVENOUS | Status: DC | PRN
  Administered 2023-11-22: 500 mL

## 2023-11-22 MED ORDER — ACETAMINOPHEN 325 MG PO TABS: 650.0000 mg | ORAL_TABLET | ORAL | Status: DC | PRN

## 2023-11-22 MED ORDER — SODIUM CHLORIDE 0.9% FLUSH: 3.0000 mL | INTRAVENOUS | Status: DC | PRN

## 2023-11-22 MED ORDER — HYDRALAZINE HCL 20 MG/ML IJ SOLN: 10.0000 mg | INTRAMUSCULAR | Status: DC | PRN

## 2023-11-22 MED ORDER — LIDOCAINE HCL (PF) 1 % IJ SOLN
INTRAMUSCULAR | Status: AC
Start: 2023-11-22 — End: ?
  Filled 2023-11-22: qty 30

## 2023-11-22 MED ORDER — SODIUM CHLORIDE 0.9 % IV SOLN
INTRAVENOUS | Status: DC
Start: 2023-11-22 — End: 2023-11-22

## 2023-11-22 MED ORDER — SODIUM CHLORIDE 0.9 % IV SOLN: 250.0000 mL | INTRAVENOUS | Status: DC | PRN

## 2023-11-22 SURGICAL SUPPLY — 8 items
CATH BALLN WEDGE 5F 110CM (CATHETERS) IMPLANT
CATH SWAN GANZ 7F STRAIGHT (CATHETERS) IMPLANT
GLIDESHEATH SLENDER 7FR .021G (SHEATH) IMPLANT
GUIDEWIRE .025 260CM (WIRE) IMPLANT
PACK CARDIAC CATHETERIZATION (CUSTOM PROCEDURE TRAY) ×2 IMPLANT
SHEATH GLIDE SLENDER 4/5FR (SHEATH) IMPLANT
TRANSDUCER W/STOPCOCK (MISCELLANEOUS) IMPLANT
TUBING ART PRESS 72 MALE/FEM (TUBING) IMPLANT

## 2023-11-22 NOTE — Interval H&P Note (Signed)
History and Physical Interval Note:  11/22/2023 9:12 AM  Ryan [name]  has presented today for surgery, with the diagnosis of heart failure.  The various methods of treatment have been discussed with the patient and family. After consideration of risks, benefits and other options for treatment, the patient has consented to  Procedure(s): RIGHT HEART CATH (N/A) as a surgical intervention.  The patient's history has been reviewed, patient examined, no change in status, stable for surgery.  I have reviewed the patient's chart and labs.  Questions were answered to the patient's satisfaction.     Tanuj Mullens [employer]

## 2023-11-22 NOTE — Progress Notes (Signed)
Lab unable to come to draw NP-pro BNP / main lab unsure of tubes, I drew gold/ light green and lavender and sent to lab, when I scanned the band for labels, sunquest would not tell me what tubes to draw. Sent to main lab in stat bag.

## 2023-11-22 NOTE — Progress Notes (Incomplete)
***In Progress***    Advanced Heart Failure Clinic Note   PCP: Cleatis Polka., MD Cardiology: Dr. Rosemary Holms HF Cardiology: Dr. Shirlee Latch  HPI:  82 y.o. with history of CAD s/p CABG, ischemic cardiomyopathy, permanent atrial fibrillation, PAD, and CKD stage 3 was referred by Dr. Rosemary Holms for evaluation of CHF.  Patient is s/p CABG, last cath in 04/2022 showed occluded native vessels with patent LIMA-LAD (occluded SVG-ramus and SVG-PDA), no interventional target.  Last echo in 07/2023 showed EF 25-30%, moderate LVH, severe LV dilation, mild aortic stenosis, mild MR.  Patient has a [employer] ICD.  He has had atrial fibrillation that appears to be permanent; all ECGs since 2021 show atrial fibrillation.  He has PAD and is s/p aortofemoral bypass.  He has had VT episodes, the last was on 11/08/23.    Presented to [facility] Clinic 11/12/23. When patient had the 11/08/23 VT episode, he was blowing leaves.  It was terminated by ATP.  He reported shortness of breath while blowing the leaves and felt tachycardia but no lightheadedness/syncope.  He was started back on amiodrone the previous day (had been on it in the past, then stopped). Generally, he reported getting short of breath walking about 400 feet (the length of his driveway and back to his house).  He was short of breath with stairs.  No problems with ADLs or walking around the house. Noted rare lightheadedness with standing.  No orthopnea/PND.  He had some trouble with his balance and has had falls.   Today he returns to HF clinic for pharmacist medication titration. At last visit with MD,he was volume overloaded by exam and HeartLogic, so  torsemide was increased to 40 qam/20 qpm for 3 days, then 40 mg daily was resumed. Additionally, Farxiga 10 mg daily was initiated. RHC completed 11/22/23, was referred for barostimulator evaluation.   Shortness of breath/dyspnea on exertion? {YES NO:22349}  Orthopnea/PND? {YES NO:22349} Edema? {YES  NO:22349} Lightheadedness/dizziness? {YES NO:22349} Daily weights at home? {YES NO:22349} Blood pressure/heart rate monitoring at home? {YES [id number] Following low-sodium/fluid-restricted diet? {YES NO:22349}  HF Medications: Bisoprolol 5 mg daily Losartan 25 mg daily Farxiga 10 mg daily Digoxin 0.0625 mg daily Torsemide 20 mg BID  Has the patient been experiencing any side effects to the medications prescribed?  {YES NO:22349}  Does the patient have any problems obtaining medications due to transportation or finances?   {YES NO:22349}  Understanding of regimen: {excellent/good/fair/poor:19665} Understanding of indications: {excellent/good/fair/poor:19665} Potential of compliance: {excellent/good/fair/poor:19665} Patient understands to avoid NSAIDs. Patient understands to avoid decongestants.    Pertinent Lab Values: Serum creatinine ***, BUN ***, Potassium ***, Sodium ***, BNP ***, Magnesium ***, Digoxin ***   Vital Signs: Weight: *** (last clinic weight: ***) Blood pressure: ***  Heart rate: ***   Assessment/Plan: 1. Chronic systolic CHF: Ischemic cardiomyopathy.  [employer] ICD.  Echo in 07/2023 showed EF 25-30%, moderate LVH, severe LV dilation, mild aortic stenosis, mild MR.   - He is volume overloaded today by exam and HeartLogic.  NYHA class III.   - Continue torsemide 40 mg daily.  - Continue bisoprolol 5 mg daily.  - Continue losartan 25 mg daily.  -Continue Farxiga 10 mg daily. He failed Jardiance in the past due to nausea..  - Start spironolactone as long as K stays in normal range (upper normal when last checked). *** - Based on advanced age, probably would not be LVAD candidate. Was referred for barostimulator evaluation.  2. CAD: s/p CABG.  Cath in 04/2022  without interventional option.  Severe disease of native vessels, occluded SVG-ramus and SVG-PDA, patent LIMA-LAD. No chest pain.  - No ASA given Eliquis use.  - Continue Repatha.  3. Atrial  fibrillation: Present since 2021, appears permanent. Reasonable rate control.  - Continue apixaban, dosed at 2.5 bid for age > 80, creatinine > 1.5.  4. CKD stage 3: *** 5. VT: Last episode in 10/2023 terminated by ATP.   - Continue amiodarone 200 mg daily.  Follow LFTs and TSH, will need regular eye exam.  6. PAD: H/o aortobifemoral bypass.  He denies claudication.   Follow up ***   Karle Plumber, PharmD, BCPS, BCCP, CPP Heart Failure Clinic Pharmacist [phone]

## 2023-11-22 NOTE — Discharge Instructions (Addendum)

## 2023-11-23 LAB — MISC LABCORP TEST (SEND OUT): Labcorp test code: 143000

## 2023-11-26 ENCOUNTER — Other Ambulatory Visit: Payer: Self-pay | Admitting: Cardiology

## 2023-12-01 ENCOUNTER — Other Ambulatory Visit (HOSPITAL_COMMUNITY): Payer: Medicare Other

## 2023-12-01 LAB — POCT I-STAT EG7
Acid-Base Excess: 2 mmol/L (ref 0.0–2.0)
Bicarbonate: 24.8 mmol/L (ref 20.0–28.0)
Calcium, Ion: 1.17 mmol/L (ref 1.15–1.40)
HCT: 37 % — ABNORMAL LOW (ref 39.0–52.0)
Hemoglobin: 12.6 g/dL — ABNORMAL LOW (ref 13.0–17.0)
O2 Saturation: 66 %
Potassium: 3.7 mmol/L (ref 3.5–5.1)
Sodium: 140 mmol/L (ref 135–145)
TCO2: 26 mmol/L (ref 22–32)
pCO2, Ven: 32.3 mm[Hg] — ABNORMAL LOW (ref 44–60)
pH, Ven: 7.494 — ABNORMAL HIGH (ref 7.25–7.43)
pO2, Ven: 31 mm[Hg] — CL (ref 32–45)

## 2023-12-01 NOTE — Progress Notes (Addendum)
Advanced Heart Failure Clinic Note   PCP: Cleatis Polka., MD Cardiology: Dr. Rosemary Holms HF Cardiology: Dr. Shirlee Latch  HPI:  82 y.o. with history of CAD s/p CABG, ischemic cardiomyopathy, permanent atrial fibrillation, PAD, and CKD stage 3 was referred by Dr. Rosemary Holms for evaluation of CHF.  Patient is s/p CABG, last cath in 04/2022 showed occluded native vessels with patent LIMA-LAD (occluded SVG-ramus and SVG-PDA), no interventional target.  Last echo in 07/2023 showed EF 25-30%, moderate LVH, severe LV dilation, mild aortic stenosis, mild MR.  Patient has a Environmental manager ICD.  He has had atrial fibrillation that appears to be permanent; all ECGs since 2021 show atrial fibrillation.  He has PAD and is s/p aortofemoral bypass.  He has had VT episodes, the last was on 11/08/23.    Presented to AHF Clinic 11/12/23. When patient had the 11/08/23 VT episode, he was blowing leaves.  It was terminated by ATP.  He reported shortness of breath while blowing the leaves and felt tachycardia but no lightheadedness/syncope.  He was started back on amiodarone the previous day (had been on it in the past, then stopped). Generally, he reported getting short of breath walking about 400 feet (the length of his driveway and back to his house).  He was short of breath with stairs.  No problems with ADLs or walking around the house. Noted rare lightheadedness with standing.  No orthopnea/PND.  He had some trouble with his balance and has had falls.   Today he returns to HF clinic for pharmacist medication titration with his wife. At last visit with MD, he was volume overloaded by exam and [employer], so torsemide was increased to 40 qam/20 qpm for 3 days, then 40 mg daily was resumed. Additionally, Farxiga 10 mg daily was initiated. RHC completed 11/22/23, was referred for barostimulator evaluation. Overall he is feeling ok today. Main complaint is dizziness and sleepiness/fatigue which is significantly worse since  last visit. Says he did not want to get out of bed this morning because of it. He is concerned one of his medications is causing these issues. Also notes that he does not eat a lot. Says he doesn't really have an appetite to eat, but he is also very wary about gaining weight, particularly fluid weight. No LEE, PND or orthopnea. No CP or palpitations. SOB/DOE is stable.    HF Medications: Bisoprolol 5 mg daily Losartan 12.5 mg daily Farxiga 10 mg daily Digoxin 0.0625 mg daily Torsemide 40 mg daily  Has the patient been experiencing any side effects to the medications prescribed?  Yes - dizziness and "sleepiness" since last visit. Patient is concerned this could be related to either amiodarone or [place].   Does the patient have any problems obtaining medications due to transportation or finances?   no  Understanding of regimen: good Understanding of indications: good Potential of compliance: good Patient understands to avoid NSAIDs. Patient understands to avoid decongestants.    Pertinent Lab Values: 11/22/23: Serum creatinine 1.46, BUN 36, Potassium 3.6, Sodium 139  Vital Signs: Weight: 143.2 lbs (last clinic weight: 144 lbs) Blood pressure: 110/68  Heart rate: 55   Assessment/Plan: 1. Chronic systolic CHF: Ischemic cardiomyopathy.  [employer] ICD.  Echo in 07/2023 showed EF 25-30%, moderate LVH, severe LV dilation, mild aortic stenosis, mild MR.   -  NYHA class III, euvolemic on exam.   - Continue torsemide 40 mg daily - Continue bisoprolol 5 mg daily.  - Continue losartan 12.5 mg daily.  -  Decrease Farxiga to 5 mg daily to see if dizziness/fatigue improves. Would try taking medications with food. He failed Jardiance in the past due to nausea.  - Based on advanced age, probably would not be LVAD candidate. Was referred for barostimulator evaluation, scheduled for 01/10/24.  2. CAD: s/p CABG.  Cath in 04/2022 without interventional option.  Severe disease of native vessels,  occluded SVG-ramus and SVG-PDA, patent LIMA-LAD. No chest pain.  - No ASA given Eliquis use.  - Continue Repatha.  3. Atrial fibrillation: Present since 2021, appears permanent. Reasonable rate control.  - Continue apixaban, dosed at 2.5 bid for age > 80, creatinine > 1.5.  4. CKD stage 3:  5. VT: Last episode in 10/2023 terminated by ATP.   - Continue amiodarone 200 mg daily.  Follow LFTs and TSH, will need regular eye exam. Amiodarone may be causing or contributing to his symptoms of dizziness/fatigue. However, with recent VT episode will make no changes. Will try and see if symptoms improve with decreasing Farxiga.  6. PAD: H/o aortobifemoral bypass.  He denies claudication.   Follow up 1 month with Dr. Jonn Shingles, PharmD, BCPS, [facility], CPP Heart Failure Clinic Pharmacist [phone]

## 2023-12-06 ENCOUNTER — Other Ambulatory Visit (HOSPITAL_COMMUNITY): Payer: Self-pay

## 2023-12-07 ENCOUNTER — Ambulatory Visit: Payer: Medicare Other | Attending: Internal Medicine | Admitting: Internal Medicine

## 2023-12-07 ENCOUNTER — Encounter: Payer: Self-pay | Admitting: Internal Medicine

## 2023-12-07 VITALS — BP 112/58 | HR 71 | Ht 66.0 in | Wt 143.2 lb

## 2023-12-07 DIAGNOSIS — I4819 Other persistent atrial fibrillation: Secondary | ICD-10-CM | POA: Diagnosis not present

## 2023-12-07 LAB — CUP PACEART INCLINIC DEVICE CHECK
Date Time Interrogation Session: 20241217172927
HighPow Impedance: 58 Ohm
HighPow Impedance: 63 Ohm
Implantable Lead Connection Status: 753985
Implantable Lead Implant Date: 20220110
Implantable Lead Location: 753860
Implantable Lead Model: 137
Implantable Lead Serial Number: 301176
Implantable Pulse Generator Implant Date: 20220110
Lead Channel Impedance Value: 612 Ohm
Lead Channel Pacing Threshold Amplitude: 0.6 V
Lead Channel Pacing Threshold Pulse Width: 0.4 ms
Lead Channel Sensing Intrinsic Amplitude: 12.5 mV
Lead Channel Setting Pacing Amplitude: 2.5 V
Lead Channel Setting Pacing Pulse Width: 0.4 ms
Lead Channel Setting Sensing Sensitivity: 0.5 mV
Pulse Gen Serial Number: 211464

## 2023-12-07 NOTE — Patient Instructions (Signed)

## 2023-12-07 NOTE — Progress Notes (Signed)
HPI Ryan [name] returns today for followup. He is a pleasant 82 yo man with a h/o LV dysfunction and VT and atrial fib who presented with VT back in January 2022 and underwent ICD implantation at that time. He has known CAD and is s/p MI remotely. His EF by echo in April demonstrates an EF of 25%. Since his ICD insertion he notes class 2 CHF symptoms. He has had an uptick in his ICD therapies and has increased his dose of amiodarone. He denies chest pain or sob. Since I saw him last in 11/23, he notes some fatigue but has overall feels better and his wife corroborates this.  Allergies  Allergen Reactions   Jardiance [Empagliflozin] Nausea Only and Other (See Comments)    Made patient very sick to the stomach.   Ezetimibe Other (See Comments)    Myalgias    Ace Inhibitors Cough    Other reaction(s): cough   Aspirin Other (See Comments)    skin rash/easy bruising   Atorvastatin Other (See Comments)    weakness, fatigue (severe)   Ezetimibe-Simvastatin Other (See Comments)    myalgias   Rosuvastatin Other (See Comments)    Myalgias/weakness   Codeine Nausea And Vomiting         Unithroid [Levothyroxine Sodium] Other (See Comments)    Caused blurry vision per pt     Current Outpatient Medications  Medication Sig Dispense Refill   acetaminophen (TYLENOL) 325 MG tablet Take 2 tablets (650 mg total) by mouth every 6 (six) hours as needed for mild pain (or Fever >/= 101). (Patient taking differently: Take 325 mg by mouth every 6 (six) hours as needed for mild pain (pain score 1-3) (or Fever >/= 101).) 20 tablet 0   albuterol (VENTOLIN HFA) 108 (90 Base) MCG/ACT inhaler Inhale 1 puff into the lungs every 6 (six) hours as needed for wheezing or shortness of breath.     allopurinol (ZYLOPRIM) 100 MG tablet Take 100 mg by mouth daily as needed (gout).     ALPRAZolam (XANAX) 0.5 MG tablet Take 0.5 mg by mouth 3 (three) times daily as needed for anxiety or sleep.     amiodarone  (PACERONE) 200 MG tablet Take 1 tablet (200 mg total) by mouth daily. 90 tablet 3   apixaban (ELIQUIS) 5 MG TABS tablet Take 2.5 mg by mouth 2 (two) times daily.     bisacodyl (DULCOLAX) 5 MG EC tablet Take 5 mg by mouth daily.     bisoprolol (ZEBETA) 5 MG tablet Take 1 tablet (5 mg total) by mouth daily. 90 tablet 3   digoxin (LANOXIN) 0.125 MG tablet Take 0.5 tablets (0.0625 mg total) by mouth daily. 30 tablet 11   Evolocumab (REPATHA SURECLICK) 140 MG/ML SOAJ INJECT 140 MILLIGRAMS INTO THE SKIN EVERY 14 DAYS 6 mL 1   FARXIGA 10 MG TABS tablet Take 1 tablet (10 mg total) by mouth daily before breakfast. 30 tablet 11   losartan (COZAAR) 25 MG tablet TAKE 1/2 TABLET BY MOUTH DAILY 45 tablet 3   meclizine (ANTIVERT) 12.5 MG tablet Take 2 tablets (25 mg total) by mouth 3 (three) times daily as needed. 90 tablet 3   methocarbamol (ROBAXIN) 500 MG tablet Take 500 mg by mouth daily as needed for muscle spasms (back pain.).     nitroGLYCERIN (NITROSTAT) 0.4 MG SL tablet Place 1 tablet (0.4 mg total) under the tongue every 5 (five) minutes as needed for chest pain. 25 tablet 3  pantoprazole (PROTONIX) 40 MG tablet Take 1 tablet (40 mg total) by mouth daily. 30 tablet 11   polyethylene glycol powder (GLYCOLAX/MIRALAX) 17 GM/SCOOP powder Take 17 g by mouth at bedtime.     ranolazine (RANEXA) 500 MG 12 hr tablet Take 1 tablet (500 mg total) by mouth daily. (Patient taking differently: Take 250 mg by mouth 2 (two) times daily.) 45 tablet 3   tobramycin (TOBREX) 0.3 % ophthalmic solution Place 1 drop into the left eye daily as needed (Dry eyes).     torsemide (DEMADEX) 20 MG tablet Take 1 tablet (20 mg total) by mouth 2 (two) times daily. 60 tablet 3   No current facility-administered medications for this visit.     Past Medical History:  Diagnosis Date   Acquired thrombophilia (HCC) 11/01/2021   Arthritis    CAD (coronary artery disease)    Carotid artery occlusion    Cataract    Bil eyes/worse in  left eye   CHF (congestive heart failure) (HCC)    Chronic back pain    COVID-19 10/31/2021   DVT (deep venous thrombosis) (HCC)    Dysrhythmia    Enlarged prostate    takes Rapaflo daily   GERD (gastroesophageal reflux disease)    occasional   History of colon polyps    History of gout    has colchicine prn   History of kidney stones    Hyperlipidemia    takes Crestor daily   Hypertension    takes Amlodipine daily   Hypothyroidism    Myocardial infarction [facility])    Peripheral vascular disease (HCC)    Prolonged QT interval 11/01/2021   Pulmonary emboli (HCC) 03/20/2015   elevated d-dimer, intermediate V/Q study, atypical chest pain and SOB. Start on Xarelto 20mg  BID for 3 month   Rapid atrial fibrillation (HCC)    Renal insufficiency    Shortness of breath dyspnea    Urinary frequency    Urinary urgency     ROS:   All systems reviewed and negative except as noted in the HPI.   Past Surgical History:  Procedure Laterality Date   APPENDECTOMY     BACK SURGERY     5 times   big toe surgery     CARDIAC CATHETERIZATION     2010    dr Elease Hashimoto   cataract surgery     left eye   CHOLECYSTECTOMY N/A 07/27/2016   Procedure: LAPAROSCOPIC CHOLECYSTECTOMY;  Surgeon: Rodman Pickle, MD;  Location[facility] OR;  Service: General;  Laterality: N/A;   COLONOSCOPY     CORONARY ARTERY BYPASS GRAFT N/A 04/05/2015   Procedure: CORONARY ARTERY BYPASS GRAFTING (CABG)X4 LIMA-LAD; SVG-DIAG1-DIAG2; SVG-PD;  Surgeon: Loreli Slot, MD;  Location: [facility] OR;  Service: Open Heart Surgery;  Laterality: N/A;   CORONARY BALLOON ANGIOPLASTY N/A 04/14/2022   Procedure: CORONARY BALLOON ANGIOPLASTY;  Surgeon: Elder Negus, MD;  Location: [facility] INVASIVE CV LAB;  Service: Cardiovascular;  Laterality: N/A;   CORONARY ULTRASOUND/IVUS N/A 04/14/2022   Procedure: Intravascular Ultrasound/IVUS;  Surgeon: Elder Negus, MD;  Location: [facility] INVASIVE CV LAB;  Service: Cardiovascular;  Laterality: N/A;    CORONARY/GRAFT ANGIOGRAPHY N/A 04/22/2018   Procedure: CORONARY/GRAFT ANGIOGRAPHY;  Surgeon: Swazil[place], Peter M, MD;  Location: [facility] INVASIVE CV LAB;  Service: Cardiovascular;  Laterality: N/A;   CYSTOSCOPY     ENDARTERECTOMY Left 04/24/2016   Procedure: ENDARTERECTOMY LEFT CAROTID;  Surgeon: Pryor Ochoa, MD;  Location: [facility] OR;  Service: Vascular;  Laterality: Left;  EYE SURGERY     FEMORAL ARTERY - POPLITEAL ARTERY BYPASS GRAFT     ICD IMPLANT N/A 12/30/2020   Procedure: ICD IMPLANT;  Surgeon: Marinus Maw, MD;  Location: [facility] INVASIVE CV LAB;  Service: Cardiovascular;  Laterality: N/A;   JOINT REPLACEMENT     shoulder   LEFT HEART CATH N/A 04/14/2022   Procedure: Left Heart Cath;  Surgeon: Elder Negus, MD;  Location: [facility] INVASIVE CV LAB;  Service: Cardiovascular;  Laterality: N/A;   LEFT HEART CATH AND CORONARY ANGIOGRAPHY N/A 04/24/2022   Procedure: LEFT HEART CATH AND CORONARY ANGIOGRAPHY;  Surgeon: Yates Decamp, MD;  Location: [facility] INVASIVE CV LAB;  Service: Cardiovascular;  Laterality: N/A;   LEFT HEART CATHETERIZATION WITH CORONARY ANGIOGRAM N/A 04/03/2015   Procedure: LEFT HEART CATHETERIZATION WITH CORONARY ANGIOGRAM;  Surgeon: Lennette Bihari, MD;  Location: [facility] CATH LAB;  Service: Cardiovascular;  Laterality: N/A;   LUMBAR LAMINECTOMY  01/06/2013   Procedure: MICRODISCECTOMY LUMBAR LAMINECTOMY;  Surgeon: Eldred Manges, MD;  Location: [facility] OR;  Service: Orthopedics;  Laterality: N/A;  L3-4 decompression   LUMBAR LAMINECTOMY/DECOMPRESSION MICRODISCECTOMY  02/12/2012   Procedure: LUMBAR LAMINECTOMY/DECOMPRESSION MICRODISCECTOMY;  Surgeon: [name], MD;  Location: [facility] NEURO ORS;  Service: Neurosurgery;  Laterality: N/A;  Lumbar four-five laminectomy   PATCH ANGIOPLASTY Left 04/24/2016   Procedure: LEFT CAROTID ARTERY PATCH ANGIOPLASTY;  Surgeon: Pryor Ochoa, MD;  Location: [facility] OR;  Service: Vascular;  Laterality: Left;   RIGHT HEART CATH N/A 11/22/2023   Procedure: RIGHT HEART CATH;   Surgeon: Laurey Morale, MD;  Location: [facility] INVASIVE CV LAB;  Service: Cardiovascular;  Laterality: N/A;   RIGHT HEART CATH AND CORONARY/GRAFT ANGIOGRAPHY N/A 01/30/2019   Procedure: RIGHT HEART CATH AND CORONARY/GRAFT ANGIOGRAPHY;  Surgeon: Lennette Bihari, MD;  Location: [facility] INVASIVE CV LAB;  Service: Cardiovascular;  Laterality: N/A;   RIGHT/LEFT HEART CATH AND CORONARY ANGIOGRAPHY N/A 03/17/2022   Procedure: RIGHT/LEFT HEART CATH AND CORONARY ANGIOGRAPHY;  Surgeon: Elder Negus, MD;  Location: [facility] INVASIVE CV LAB;  Service: Cardiovascular;  Laterality: N/A;   STERIOD INJECTION Right 01/09/2014   Procedure: STEROID INJECTION;  Surgeon: Kathryne Hitch, MD;  Location: [facility] OR;  Service: Orthopedics;  Laterality: Right;   TEE WITHOUT CARDIOVERSION N/A 04/05/2015   Procedure: TRANSESOPHAGEAL ECHOCARDIOGRAM (TEE);  Surgeon: Loreli Slot, MD;  Location: [facility] OR;  Service: Open Heart Surgery;  Laterality: N/A;   TOTAL HIP ARTHROPLASTY Left 01/09/2014   DR Magnus Ivan   TOTAL HIP ARTHROPLASTY Left 01/09/2014   Procedure: LEFT TOTAL HIP ARTHROPLASTY ANTERIOR APPROACH and Steroid Injection Right hip;  Surgeon: Kathryne Hitch, MD;  Location: [facility] OR;  Service: Orthopedics;  Laterality: Left;   TOTAL HIP ARTHROPLASTY Right 08/15/2019   TOTAL HIP ARTHROPLASTY Right 08/15/2019   Procedure: RIGHT TOTAL HIP ARTHROPLASTY ANTERIOR APPROACH;  Surgeon: Kathryne Hitch, MD;  Location: [facility] OR;  Service: Orthopedics;  Laterality: Right;     Family History  Problem Relation Age of Onset   Heart disease Father    Heart attack Father    Heart disease Sister    Hypertension Sister    Heart attack Sister    Hypertension Mother    Diabetes Son      Social History   Socioeconomic History   Marital status: Married    Spouse name: Theo Fleece   Number of children: 2   Years of education: Not on file   Highest education level: 9th grade  Occupational History  Not on file  Tobacco  Use   Smoking status: Former    Current packs/day: 0.00    Average packs/day: 1 pack/day for 25.0 years (25.0 ttl pk-yrs)    Types: Cigarettes    Start date: 02/04/1962    Quit date: 02/04/1987    Years since quitting: 36.8   Smokeless tobacco: Former    Types: Chew    Quit date: 07/20/2009   Tobacco comments:    quit 35+yrs ago  Vaping Use   Vaping status: Never Used  Substance and Sexual Activity   Alcohol use: No    Alcohol/week: 0.0 standard drinks of alcohol   Drug use: No   Sexual activity: Not Currently  Other Topics Concern   Not on file  Social History Narrative   Not on file   Social Drivers of Health   Financial Resource Strain: Low Risk  (08/28/2021)   Overall Financial Resource Strain (CARDIA)    Difficulty of Paying Living Expenses: Not hard at all  Food Insecurity: No Food Insecurity (07/08/2022)   Hunger Vital Sign    Worried About Running Out of Food in the Last Year: Never true    Ran Out of Food in the Last Year: Never true  Transportation Needs: No Transportation Needs (07/08/2022)   PRAPARE - Administrator, Civil Service (Medical): No    Lack of Transportation (Non-Medical): No  Physical Activity: Not on file  Stress: Not on file  Social Connections: Not on file  Intimate Partner Violence: Not At Risk (07/08/2022)   Humiliation, Afraid, Rape, and Kick questionnaire    Fear of Current or Ex-Partner: No    Emotionally Abused: No    Physically Abused: No    Sexually Abused: No     BP (!) 112/58   Pulse 71   Ht 5\' 6"  (1.676 m)   Wt 143 lb 3.2 oz (65 kg)   SpO2 97%   BMI 23.11 kg/m   Physical Exam:  Well appearing NAD HEENT: Unremarkable Neck:  No JVD, no thyromegally Lymphatics:  No adenopathy Back:  No CVA tenderness Lungs:  Clear HEART:  Regular rate rhythm, no murmurs, no rubs, no clicks Abd:  soft, positive bowel sounds, no organomegally, no rebound, no guarding Ext:  2 plus pulses, no edema, no cyanosis, no clubbing Skin:   No rashes no nodules Neuro:  CN II through XII intact, motor grossly intact  EKG  DEVICE  Normal device function.  See PaceArt for details.   Assess/Plan:  Lightheaded - his bp is now better. I encouraged him to rest if/when his symptoms return. ICM - he denies anginal symptoms. Chronic systolic heart failure - his symptoms are class 2. VT - he has had one episode which looks like atrial fib with an RVR.  Persistent atrial fib - his VR is controlled.    Sharlot Gowda Yarimar Lavis,MD

## 2023-12-08 DIAGNOSIS — R3912 Poor urinary stream: Secondary | ICD-10-CM | POA: Diagnosis not present

## 2023-12-08 DIAGNOSIS — N401 Enlarged prostate with lower urinary tract symptoms: Secondary | ICD-10-CM | POA: Diagnosis not present

## 2023-12-08 DIAGNOSIS — R972 Elevated prostate specific antigen [PSA]: Secondary | ICD-10-CM | POA: Diagnosis not present

## 2023-12-09 ENCOUNTER — Ambulatory Visit (HOSPITAL_COMMUNITY)
Admission: RE | Admit: 2023-12-09 | Discharge: 2023-12-09 | Disposition: A | Discharge status: Home / Self Care | Payer: Medicare Other | Source: Ambulatory Visit | Attending: Cardiology | Admitting: Cardiology

## 2023-12-09 ENCOUNTER — Telehealth (HOSPITAL_COMMUNITY): Payer: Self-pay | Admitting: Pharmacist

## 2023-12-09 ENCOUNTER — Telehealth (HOSPITAL_COMMUNITY): Payer: Self-pay

## 2023-12-09 ENCOUNTER — Other Ambulatory Visit (HOSPITAL_COMMUNITY): Payer: Self-pay

## 2023-12-09 VITALS — BP 110/68 | HR 55 | Wt 143.2 lb

## 2023-12-09 DIAGNOSIS — N183 Chronic kidney disease, stage 3 unspecified: Secondary | ICD-10-CM | POA: Insufficient documentation

## 2023-12-09 DIAGNOSIS — I5020 Unspecified systolic (congestive) heart failure: Secondary | ICD-10-CM | POA: Diagnosis not present

## 2023-12-09 DIAGNOSIS — I4819 Other persistent atrial fibrillation: Secondary | ICD-10-CM | POA: Diagnosis not present

## 2023-12-09 DIAGNOSIS — I255 Ischemic cardiomyopathy: Secondary | ICD-10-CM | POA: Insufficient documentation

## 2023-12-09 DIAGNOSIS — I739 Peripheral vascular disease, unspecified: Secondary | ICD-10-CM | POA: Diagnosis not present

## 2023-12-09 DIAGNOSIS — R5383 Other fatigue: Secondary | ICD-10-CM | POA: Diagnosis not present

## 2023-12-09 DIAGNOSIS — Z79899 Other long term (current) drug therapy: Secondary | ICD-10-CM | POA: Insufficient documentation

## 2023-12-09 DIAGNOSIS — R42 Dizziness and giddiness: Secondary | ICD-10-CM | POA: Insufficient documentation

## 2023-12-09 DIAGNOSIS — Z7984 Long term (current) use of oral hypoglycemic drugs: Secondary | ICD-10-CM | POA: Insufficient documentation

## 2023-12-09 DIAGNOSIS — Z951 Presence of aortocoronary bypass graft: Secondary | ICD-10-CM | POA: Insufficient documentation

## 2023-12-09 DIAGNOSIS — I5022 Chronic systolic (congestive) heart failure: Secondary | ICD-10-CM | POA: Diagnosis not present

## 2023-12-09 DIAGNOSIS — I2510 Atherosclerotic heart disease of native coronary artery without angina pectoris: Secondary | ICD-10-CM | POA: Diagnosis not present

## 2023-12-09 MED ORDER — DAPAGLIFLOZIN PROPANEDIOL 5 MG PO TABS: 5.0000 mg | ORAL_TABLET | Freq: Every day | ORAL | 11 refills | Status: DC

## 2023-12-09 NOTE — Telephone Encounter (Signed)
Medication Samples have been provided to the patient.   Drug name: Marcelline Deist     Strength: 10 mg     Qty: 2 boxes                 LOT: [id number] Exp.Date: 05/20/26   Dosing instructions: take 1/2 tablet (5 mg) daily   The patient has been instructed regarding the correct time, dose, and frequency of taking this medication, including desired effects and most common side effects.   Karle Plumber, PharmD, BCPS, CPP Heart Failure Clinic Pharmacist [phone]

## 2023-12-09 NOTE — Telephone Encounter (Signed)
Advanced Heart Failure Patient Advocate Encounter  Patient was recently started on Farxiga, and was concerned about pricing after trial card use. Pt should be due for refill, and I have left a message for pt or spouse to contact me if there are any concerns with the cost of the medication.  Burnell Blanks, CPhT Rx Patient Advocate Phone: [phone]

## 2023-12-09 NOTE — Patient Instructions (Signed)
It was a pleasure seeing you today!  MEDICATIONS: -We are changing your medications today -Decrease Farxiga to 5 mg (1 tablet) daily. You may take 1/2 tablet of the 10 mg strength daily until you receive the new strength.  -Call if you have questions about your medications.   NEXT APPOINTMENT: Return to clinic in 1 month with Dr. Shirlee Latch.  In general, to take care of your heart failure: -Limit your fluid intake to 2 Liters (half-gallon) per day.   -Limit your salt intake to ideally 2-3 grams (2000-3000 mg) per day. -Weigh yourself daily and record, and bring that "weight diary" to your next appointment.  (Weight gain of 2-3 pounds in 1 day typically means fluid weight.) -The medications for your heart are to help your heart and help you live longer.   -Please contact us before stopping any of your heart medications.  Call the clinic at [phone] with questions or to reschedule future appointments.

## 2023-12-28 ENCOUNTER — Telehealth: Payer: Self-pay

## 2023-12-28 NOTE — Telephone Encounter (Signed)
 Patient's wife, Nichole called stating that patient has gout, foot is red and swollen. She states that she was told no appointments are available until 01/13/24. She would like to know what she can do.Patient was last seen 07/24/2022 no appointment was given to patient.

## 2023-12-29 ENCOUNTER — Ambulatory Visit (INDEPENDENT_AMBULATORY_CARE_PROVIDER_SITE_OTHER): Payer: Medicare Other

## 2023-12-29 ENCOUNTER — Telehealth (HOSPITAL_COMMUNITY): Payer: Self-pay | Admitting: Cardiology

## 2023-12-29 ENCOUNTER — Telehealth: Payer: Self-pay | Admitting: Cardiology

## 2023-12-29 DIAGNOSIS — I5020 Unspecified systolic (congestive) heart failure: Secondary | ICD-10-CM

## 2023-12-29 LAB — CUP PACEART REMOTE DEVICE CHECK
Battery Remaining Longevity: 150 mo
Battery Remaining Percentage: 100 %
Brady Statistic RV Percent Paced: 9 %
Date Time Interrogation Session: 20250108035900
HighPow Impedance: 61 Ohm
Implantable Lead Connection Status: 753985
Implantable Lead Implant Date: 20220110
Implantable Lead Location: 753860
Implantable Lead Model: 137
Implantable Lead Serial Number: 301176
Implantable Pulse Generator Implant Date: 20220110
Lead Channel Impedance Value: 529 Ohm
Lead Channel Setting Pacing Amplitude: 2.5 V
Lead Channel Setting Pacing Pulse Width: 0.4 ms
Lead Channel Setting Sensing Sensitivity: 0.5 mV
Pulse Gen Serial Number: 211464

## 2023-12-29 MED ORDER — TORSEMIDE 20 MG PO TABS: ORAL_TABLET | ORAL | 3 refills | Status: DC

## 2023-12-29 MED ORDER — POTASSIUM CHLORIDE CRYS ER 10 MEQ PO TBCR: 10.0000 meq | EXTENDED_RELEASE_TABLET | Freq: Every day | ORAL | 6 refills | Status: DC

## 2023-12-29 NOTE — Telephone Encounter (Signed)
 Pt called HF Clinic this morning in regards to this complaint.    Looks like Heart Failure Clinic addressed this today with the pt, as copied below from their telephone encounter.    Ryan [name], [place]     12/29/23  3:14 PM Note Pt aware via wife Repeat labs at follow up 1/22                                                                                                                                                                                                Ryan Harlene HERO, FNP to Ryan [name], [place]     12/29/23  9:21 AM OK to stop Farxiga . Increase torsemide  to 60 mg daily alternating with 40 mg every other day. Start 10 KCL daily. He will need a BMET in 10-14 days.

## 2023-12-29 NOTE — Telephone Encounter (Signed)
 Patients wife called to report with new medications pt is experiencing swelling in both feet and ankles. Reports feet are very red in color, warm to the touch. Swelling has increased and became warm and red over the past week  Weight increased x 2lb  weight today 138 lb Mild SOB After taking meds (FARXIGA ) reports he will get deathly sick to the stomach, has been since starting medication and just getting worse  Please advise

## 2023-12-29 NOTE — Telephone Encounter (Signed)
 Please have him come in end of day on Friday

## 2023-12-29 NOTE — Addendum Note (Signed)
 Addended by: Laiba Fuerte, Milagros Reap on: 12/29/2023 03:21 PM   Modules accepted: Orders

## 2023-12-29 NOTE — Telephone Encounter (Signed)
 Pt aware via wife Repeat labs at follow up 1/22

## 2023-12-29 NOTE — Telephone Encounter (Signed)
 Pt c/o swelling/edema: STAT if pt has developed SOB within 24 hours  If swelling, where is the swelling located? Both Feet  How much weight have you gained and in what time span? 1 lb   Have you gained 2 pounds in a day or 5 pounds in a week? Not sure  Do you have a log of your daily weights (if so, list)? no  Are you currently taking a fluid pill? yes  Are you currently SOB? Some   Have you traveled recently in a car or plane for an extended period of time? No Pt states his feet are really red

## 2023-12-30 NOTE — Telephone Encounter (Signed)
 Left message for pts wife to call to get scheduled to see Dr Charlsie Merles tomorrow on 1/10. Asked for them to call my direct line

## 2024-01-03 ENCOUNTER — Ambulatory Visit (INDEPENDENT_AMBULATORY_CARE_PROVIDER_SITE_OTHER): Payer: Medicare Other | Admitting: Podiatry

## 2024-01-03 ENCOUNTER — Ambulatory Visit (INDEPENDENT_AMBULATORY_CARE_PROVIDER_SITE_OTHER): Payer: PPO

## 2024-01-03 ENCOUNTER — Encounter: Payer: Self-pay | Admitting: Podiatry

## 2024-01-03 DIAGNOSIS — M109 Gout, unspecified: Secondary | ICD-10-CM | POA: Diagnosis not present

## 2024-01-03 DIAGNOSIS — M7751 Other enthesopathy of right foot: Secondary | ICD-10-CM

## 2024-01-03 MED ORDER — COLCHICINE 0.6 MG PO TABS: 0.6000 mg | ORAL_TABLET | Freq: Two times a day (BID) | ORAL | 3 refills | Status: AC

## 2024-01-03 MED ORDER — TRIAMCINOLONE ACETONIDE 10 MG/ML IJ SUSP
10.0000 mg | Freq: Once | INTRAMUSCULAR | Status: AC
  Administered 2024-01-03: 10 mg via INTRA_ARTICULAR

## 2024-01-05 NOTE — Progress Notes (Signed)
 Subjective:   Patient ID: Ryan [name], male   DOB: 83 y.o.   MRN: [id number]   HPI Patient presents with a lot of pain around the big toe joint right and into the midfoot right and states has had a history of gout.  Has numerous questions concerning gout and the inflammation present   ROS      Objective:  Physical Exam  Neurovascular status unchanged from previous visits with inflammation fluid around the first MPJ right pain when pressed and moderate discomfort into the midfoot right with fluid buildup redness noted with patient apparently taking allopurinol  but not good historian     Assessment:  Inflammatory condition with probability for gout and also does have inflammatory capsulitis first MPJ right     Plan:  H&P all conditions reviewed and I went ahead today did sterile prep and I did inject around the first MPJ 3 mg Dexasone Kenalog  5 mg Xylocaine  and I then discussed gout and we reviewed medicines and I am going to start him on colchicine  as needed and I did encourage him to take the allopurinol  for regular.  All questions answered today concerning

## 2024-01-06 ENCOUNTER — Other Ambulatory Visit: Payer: Self-pay | Admitting: *Deleted

## 2024-01-06 DIAGNOSIS — I5042 Chronic combined systolic (congestive) and diastolic (congestive) heart failure: Secondary | ICD-10-CM

## 2024-01-06 DIAGNOSIS — I6529 Occlusion and stenosis of unspecified carotid artery: Secondary | ICD-10-CM

## 2024-01-10 ENCOUNTER — Ambulatory Visit (HOSPITAL_COMMUNITY)
Admission: RE | Admit: 2024-01-10 | Discharge: 2024-01-10 | Disposition: A | Discharge status: Home / Self Care | Payer: PPO | Source: Ambulatory Visit | Attending: Surgery | Admitting: Surgery

## 2024-01-10 ENCOUNTER — Encounter: Payer: Self-pay | Admitting: Surgery

## 2024-01-10 ENCOUNTER — Ambulatory Visit (INDEPENDENT_AMBULATORY_CARE_PROVIDER_SITE_OTHER): Payer: PPO | Admitting: Surgery

## 2024-01-10 VITALS — BP 150/81 | HR 61 | Temp 98.2°F | Resp 20 | Ht 66.0 in | Wt 142.0 lb

## 2024-01-10 DIAGNOSIS — I5042 Chronic combined systolic (congestive) and diastolic (congestive) heart failure: Secondary | ICD-10-CM | POA: Insufficient documentation

## 2024-01-10 DIAGNOSIS — I6529 Occlusion and stenosis of unspecified carotid artery: Secondary | ICD-10-CM | POA: Insufficient documentation

## 2024-01-10 DIAGNOSIS — I5040 Unspecified combined systolic (congestive) and diastolic (congestive) heart failure: Secondary | ICD-10-CM | POA: Diagnosis not present

## 2024-01-10 NOTE — Progress Notes (Signed)
[facility] [place]  Patient name: Ryan [name] MRN: [id number] DOB: 03-15-1941 Sex: male   REQUESTING PROVIDER:    Dr. Shirlee Latch   REASON FOR CONSULT:    Barostim evaluation  HISTORY OF PRESENT ILLNESS:   Ryan [name] is a 83 y.o. male, who is referred for evaluation of a Barostim device.  The patient suffers from ischemic cardiomyopathy, permanent atrial fibrillation.  He is status post CABG.  Echo shows an ejection fraction of 25-30%.  The patient is on goal-directed medical therapy and still has symptoms of shortness of breath and fatigue.  He is status post left carotid endarterectomy by Dr. Hart Rochester in 2017.  He is status post aortobifemoral bypass graft he is status post CABG.  He is on Eliquis for atrial fibrillation.  He has stage III chronic renal insufficiency.  He has a ICD in place.   PAST MEDICAL HISTORY    Past Medical History:  Diagnosis Date   Acquired thrombophilia (HCC) 11/01/2021   Arthritis    CAD (coronary artery disease)    Carotid artery occlusion    Cataract    Bil eyes/worse in left eye   CHF (congestive heart failure) (HCC)    Chronic back pain    COVID-19 10/31/2021   DVT (deep venous thrombosis) (HCC)    Dysrhythmia    Enlarged prostate    takes Rapaflo daily   GERD (gastroesophageal reflux disease)    occasional   History of colon polyps    History of gout    has colchicine prn   History of kidney stones    Hyperlipidemia    takes Crestor daily   Hypertension    takes Amlodipine daily   Hypothyroidism    Myocardial infarction [facility])    Peripheral vascular disease (HCC)    Prolonged QT interval 11/01/2021   Pulmonary emboli (HCC) 03/20/2015   elevated d-dimer, intermediate V/Q study, atypical chest pain and SOB. Start on Xarelto 20mg  BID for 3 month   Rapid atrial fibrillation (HCC)    Renal insufficiency    Shortness of breath dyspnea    Urinary frequency    Urinary  urgency      FAMILY HISTORY   Family History  Problem Relation Age of Onset   Heart disease Father    Heart attack Father    Heart disease Sister    Hypertension Sister    Heart attack Sister    Hypertension Mother    Diabetes Son     SOCIAL HISTORY:   Social History   Socioeconomic History   Marital status: Married    Spouse name: Michaeal Tylutki   Number of children: 2   Years of education: Not on file   Highest education level: 9th grade  Occupational History   Not on file  Tobacco Use   Smoking status: Former    Current packs/day: 0.00    Average packs/day: 1 pack/day for 25.0 years (25.0 ttl pk-yrs)    Types: Cigarettes    Start date: 02/04/1962    Quit date: 02/04/1987    Years since quitting: 36.9   Smokeless tobacco: Former    Types: Chew    Quit date: 07/20/2009   Tobacco comments:    quit 35+yrs ago  Vaping Use   Vaping status: Never Used  Substance and Sexual Activity   Alcohol use: No    Alcohol/week: 0.0 standard drinks of alcohol   Drug use: No   Sexual activity: Not Currently  Other  Topics Concern   Not on file  Social History Narrative   Not on file   Social Drivers of Health   Financial Resource Strain: Low Risk  (08/28/2021)   Overall Financial Resource Strain (CARDIA)    Difficulty of Paying Living Expenses: Not hard at all  Food Insecurity: No Food Insecurity (07/08/2022)   Hunger Vital Sign    Worried About Running Out of Food in the Last Year: Never true    Ran Out of Food in the Last Year: Never true  Transportation Needs: No Transportation Needs (07/08/2022)   PRAPARE - Administrator, Civil Service (Medical): No    Lack of Transportation (Non-Medical): No  Physical Activity: Not on file  Stress: Not on file  Social Connections: Not on file  Intimate Partner Violence: Not At Risk (07/08/2022)   Humiliation, Afraid, Rape, and Kick questionnaire    Fear of Current or Ex-Partner: No    Emotionally Abused: No    Physically  Abused: No    Sexually Abused: No    ALLERGIES:    Allergies  Allergen Reactions   Jardiance [Empagliflozin] Nausea Only and Other (See Comments)    Made patient very sick to the stomach.   Ezetimibe Other (See Comments)    Myalgias    Ace Inhibitors Cough    Other reaction(s): cough   Aspirin Other (See Comments)    skin rash/easy bruising   Atorvastatin Other (See Comments)    weakness, fatigue (severe)   Ezetimibe-Simvastatin Other (See Comments)    myalgias   Rosuvastatin Other (See Comments)    Myalgias/weakness   Codeine Nausea And Vomiting         Unithroid [Levothyroxine Sodium] Other (See Comments)    Caused blurry vision per pt    CURRENT MEDICATIONS:    Current Outpatient Medications  Medication Sig Dispense Refill   acetaminophen (TYLENOL) 325 MG tablet Take 2 tablets (650 mg total) by mouth every 6 (six) hours as needed for mild pain (or Fever >/= 101). (Patient taking differently: Take 325 mg by mouth every 6 (six) hours as needed for mild pain (pain score 1-3) (or Fever >/= 101).) 20 tablet 0   allopurinol (ZYLOPRIM) 100 MG tablet Take 100 mg by mouth daily as needed (gout).     ALPRAZolam (XANAX) 0.5 MG tablet Take 0.5 mg by mouth 3 (three) times daily as needed for anxiety or sleep.     amiodarone (PACERONE) 200 MG tablet Take 1 tablet (200 mg total) by mouth daily. 90 tablet 3   apixaban (ELIQUIS) 5 MG TABS tablet Take 2.5 mg by mouth 2 (two) times daily.     bisacodyl (DULCOLAX) 5 MG EC tablet Take 5 mg by mouth daily.     bisoprolol (ZEBETA) 5 MG tablet Take 1 tablet (5 mg total) by mouth daily. 90 tablet 3   colchicine 0.6 MG tablet Take 1 tablet (0.6 mg total) by mouth 2 (two) times daily. 60 tablet 3   digoxin (LANOXIN) 0.125 MG tablet Take 0.5 tablets (0.0625 mg total) by mouth daily. 30 tablet 11   Evolocumab (REPATHA SURECLICK) 140 MG/ML SOAJ INJECT 140 MILLIGRAMS INTO THE SKIN EVERY 14 DAYS 6 mL 1   losartan (COZAAR) 25 MG tablet TAKE 1/2 TABLET  BY MOUTH DAILY 45 tablet 3   meclizine (ANTIVERT) 12.5 MG tablet Take 2 tablets (25 mg total) by mouth 3 (three) times daily as needed. 90 tablet 3   methocarbamol (ROBAXIN) 500 MG tablet Take 500  mg by mouth daily as needed for muscle spasms (back pain.).     nitroGLYCERIN (NITROSTAT) 0.4 MG SL tablet Place 1 tablet (0.4 mg total) under the tongue every 5 (five) minutes as needed for chest pain. 25 tablet 3   pantoprazole (PROTONIX) 40 MG tablet Take 1 tablet (40 mg total) by mouth daily. 30 tablet 11   polyethylene glycol powder (GLYCOLAX/MIRALAX) 17 GM/SCOOP powder Take 17 g by mouth at bedtime.     potassium chloride SA (KLOR-CON M) 10 MEQ tablet Take 1 tablet (10 mEq total) by mouth daily. 30 tablet 6   ranolazine (RANEXA) 500 MG 12 hr tablet Take 1 tablet (500 mg total) by mouth daily. (Patient taking differently: Take 250 mg by mouth 2 (two) times daily.) 45 tablet 3   tobramycin (TOBREX) 0.3 % ophthalmic solution Place 1 drop into the left eye daily as needed (Dry eyes).     torsemide (DEMADEX) 20 MG tablet TAKE 60 MG DAILY ALTERNATING 40 MG DAILY 150 tablet 3   No current facility-administered medications for this visit.    REVIEW OF SYSTEMS:   [X]  denotes positive finding, [ ]  denotes negative finding Cardiac  Comments:  Chest pain or chest pressure:    Shortness of breath upon exertion: x   Short of breath when lying flat:    Irregular heart rhythm:        Vascular    Pain in calf, thigh, or hip brought on by ambulation:    Pain in feet at night that wakes you up from your sleep:     Blood clot in your veins:    Leg swelling:         Pulmonary    Oxygen at home:    Productive cough:     Wheezing:         Neurologic    Sudden weakness in arms or legs:     Sudden numbness in arms or legs:     Sudden onset of difficulty speaking or slurred speech:    Temporary loss of vision in one eye:     Problems with dizziness:         Gastrointestinal    Blood in stool:       Vomited blood:         Genitourinary    Burning when urinating:     Blood in urine:        Psychiatric    Major depression:         Hematologic    Bleeding problems:    Problems with blood clotting too easily:        Skin    Rashes or ulcers:        Constitutional    Fever or chills:     PHYSICAL EXAM:   Vitals:   01/10/24 1338  BP: (!) 150/81  Pulse: 61  Resp: 20  Temp: 98.2 F (36.8 C)  SpO2: 92%  Weight: 142 lb (64.4 kg)  Height: 5\' 6"  (1.676 m)    GENERAL: The patient is a well-nourished male, in no acute distress. The vital signs are documented above. CARDIAC: There is a regular rate and rhythm.  VASCULAR: Well-healed left carotid incision PULMONARY: Nonlabored respirations ABDOMEN: Soft and non-tender  MUSCULOSKELETAL: There are no major deformities or cyanosis. NEUROLOGIC: No focal weakness or paresthesias are detected. SKIN: There are no ulcers or rashes noted. PSYCHIATRIC: The patient has a normal affect.  STUDIES:   I have reviewed the following:  Right Carotid: Velocities in the right ICA are consistent with a 60-79%                 stenosis.                 Non-hemodynamically significant plaque <50% noted in the  CCA.                The ECA appears <50% stenosed.   Left Carotid: Velocities in the left ICA are consistent with a 1-39%  stenosis.               Non-hemodynamically significant plaque <50% noted in the  CCA.               The ECA appears <50% stenosed.   Vertebrals: Bilateral vertebral arteries demonstrate antegrade flow.              Right vertebral artery demonstrates high resistant flow.                Increased PSV within the right ICA compared to prior exam  10/29/2021             (172 cm/s to 236 cm/s)   ASSESSMENT and PLAN   NYHE class III heart failure: The patient is on goal-directed medical therapy, however he is still having symptoms of shortness of breath and fatigue.  He is referred for evaluation of a  Barostim implant.  He has right carotid stenosis.  Velocity profile is in the 60-79% category however based off of a CT scan that he had 2 years ago and his end-diastolic velocities today, I think that he is closer to the 60% range.  He is asymptomatic.  I discussed that he would be a candidate for a right sided Barostim implant.  If he were to need carotid intervention in the future, this could be done as a TCAR.  I discussed the details of the procedure with the patient as well as risk and benefits and he is eager to proceed.  He will need to be off of his Eliquis prior to the procedure.   Charlena Cross, MD, FACS [facility] [phone] Pager [phone]

## 2024-01-12 ENCOUNTER — Ambulatory Visit (HOSPITAL_COMMUNITY)
Admission: RE | Admit: 2024-01-12 | Discharge: 2024-01-12 | Disposition: A | Discharge status: Home / Self Care | Payer: PPO | Source: Ambulatory Visit | Attending: Cardiology | Admitting: Cardiology

## 2024-01-12 ENCOUNTER — Encounter (HOSPITAL_COMMUNITY): Payer: Self-pay | Admitting: Cardiology

## 2024-01-12 ENCOUNTER — Other Ambulatory Visit (HOSPITAL_COMMUNITY): Payer: Self-pay

## 2024-01-12 VITALS — BP 130/60 | HR 49 | Wt 140.4 lb

## 2024-01-12 DIAGNOSIS — N183 Chronic kidney disease, stage 3 unspecified: Secondary | ICD-10-CM | POA: Insufficient documentation

## 2024-01-12 DIAGNOSIS — I5042 Chronic combined systolic (congestive) and diastolic (congestive) heart failure: Secondary | ICD-10-CM | POA: Diagnosis not present

## 2024-01-12 DIAGNOSIS — I739 Peripheral vascular disease, unspecified: Secondary | ICD-10-CM | POA: Insufficient documentation

## 2024-01-12 DIAGNOSIS — Z951 Presence of aortocoronary bypass graft: Secondary | ICD-10-CM | POA: Diagnosis not present

## 2024-01-12 DIAGNOSIS — I255 Ischemic cardiomyopathy: Secondary | ICD-10-CM | POA: Insufficient documentation

## 2024-01-12 DIAGNOSIS — Z95820 Peripheral vascular angioplasty status with implants and grafts: Secondary | ICD-10-CM | POA: Insufficient documentation

## 2024-01-12 DIAGNOSIS — Z95810 Presence of automatic (implantable) cardiac defibrillator: Secondary | ICD-10-CM | POA: Insufficient documentation

## 2024-01-12 DIAGNOSIS — I4821 Permanent atrial fibrillation: Secondary | ICD-10-CM | POA: Diagnosis not present

## 2024-01-12 DIAGNOSIS — I5022 Chronic systolic (congestive) heart failure: Secondary | ICD-10-CM | POA: Insufficient documentation

## 2024-01-12 DIAGNOSIS — I472 Ventricular tachycardia, unspecified: Secondary | ICD-10-CM | POA: Diagnosis not present

## 2024-01-12 DIAGNOSIS — R9431 Abnormal electrocardiogram [ECG] [EKG]: Secondary | ICD-10-CM | POA: Diagnosis not present

## 2024-01-12 DIAGNOSIS — I2510 Atherosclerotic heart disease of native coronary artery without angina pectoris: Secondary | ICD-10-CM | POA: Diagnosis not present

## 2024-01-12 LAB — COMPREHENSIVE METABOLIC PANEL
ALT: 15 U/L (ref 0–44)
AST: 29 U/L (ref 15–41)
Albumin: 3.4 g/dL — ABNORMAL LOW (ref 3.5–5.0)
Alkaline Phosphatase: 59 U/L (ref 38–126)
Anion gap: 10 (ref 5–15)
BUN: 28 mg/dL — ABNORMAL HIGH (ref 8–23)
CO2: 26 mmol/L (ref 22–32)
Calcium: 9.2 mg/dL (ref 8.9–10.3)
Chloride: 104 mmol/L (ref 98–111)
Creatinine, Ser: 1.52 mg/dL — ABNORMAL HIGH (ref 0.61–1.24)
GFR, Estimated: 45 mL/min — ABNORMAL LOW (ref >60)
Glucose, Bld: 99 mg/dL (ref 70–99)
Potassium: 4.6 mmol/L (ref 3.5–5.1)
Sodium: 140 mmol/L (ref 135–145)
Total Bilirubin: 0.8 mg/dL (ref 0.0–1.2)
Total Protein: 6.9 g/dL (ref 6.5–8.1)

## 2024-01-12 LAB — TSH: TSH: 28.439 u[IU]/mL — ABNORMAL HIGH (ref 0.350–4.500)

## 2024-01-12 LAB — DIGOXIN LEVEL: Digoxin Level: 1 ng/mL (ref 0.8–2.0)

## 2024-01-12 MED ORDER — ENTRESTO 24-26 MG PO TABS: 1.0000 | ORAL_TABLET | Freq: Two times a day (BID) | ORAL | 11 refills | Status: DC

## 2024-01-12 NOTE — Patient Instructions (Signed)
STOP Losartan   START Entresto 1/2 tab Twice daily  Labs done today, your results will be available in MyChart, we will contact you for abnormal readings.  Repeat blood work in 10 days.  Your physician recommends that you schedule a follow-up appointment in: 6 weeks.  If you have any questions or concerns before your next appointment please send [place] a message through [place] or call our office at [phone].    TO LEAVE A MESSAGE FOR THE NURSE SELECT OPTION 2, PLEASE LEAVE A MESSAGE INCLUDING: YOUR NAME DATE OF BIRTH CALL BACK NUMBER REASON FOR CALL**this is important as we prioritize the call backs  YOU WILL RECEIVE A CALL BACK THE SAME DAY AS LONG AS YOU CALL BEFORE 4:00 PM  At the [facility], you and your health needs are our priority. As part of our continuing mission to provide you with exceptional heart care, we have created designated Provider Care Teams. These Care Teams include your primary Cardiologist (physician) and Advanced Practice Providers (APPs- Physician Assistants and Nurse Practitioners) who all work together to provide you with the care you need, when you need it.   You may see any of the following providers on your designated Care Team at your next follow up: Dr Arvilla Meres Dr Marca Ancona Dr. Dorthula Nettles Dr. Clearnce Hasten Amy Filbert Schilder, NP Robbie Lis, [place] [facility] [place], [place] Brynda Peon, NP [place] Lee, NP Karle Plumber, PharmD   Please be sure to bring in all your medications bottles to every appointment.    Thank you for choosing [facility]

## 2024-01-12 NOTE — Progress Notes (Signed)
PCP: Cleatis Polka., MD Cardiology: Dr. Rosemary Holms HF Cardiology: Dr. Shirlee Latch  83 y.o. with history of CAD s/p CABG, ischemic cardiomyopathy, permanent atrial fibrillation, PAD, and CKD stage 3 was referred by Dr. Rosemary Holms for evaluation of CHF.  Patient is s/p CABG, last cath in 5/23 showed occluded native vessels with patent LIMA-LAD (occluded SVG-ramus and SVG-PDA), no interventional target.  Last echo in 8/24 showed EF 25-30%, moderate LVH, severe LV dilation, mild aortic stenosis, mild MR.  Patient has a Environmental manager ICD.  He has had atrial fibrillation that appears to be permanent; all ECGs since 2021 show atrial fibrillation.  He has PAD and is s/p aortofemoral bypass.  He has had VT episodes, the last was on 11/08/23.   RHC in 12/24 showed normal filling pressures, CI 2.55 by Fick.    Seen by Dr. Myra Gianotti in 1/25 for evaluation for baroreceptor activation therapy.   Patient returns for followup of CHF.  He has been doing better on higher torsemide dose.  HeartLogic down to 0.  He tires easily.  No orthopnea/PND.  Rare lightheadedness if he stands too fast.  BP has been stable.  He did not tolerate Farxiga due to nausea. No dyspnea walking around the house, gets short of breath walking longer distances especially in the cold. No chest pain. Weight down 4 lbs.   [employer] device interrogation: HeartLogic 0  Labs (8/24): K 4.9, creatinine 1.6, pro-BNP 3916 Labs (11/24): pro-BNP 4367, LFTs normal Labs (12/24): K 3.6, creatinine 1.46  ECG (personally reviewed): Atrial fibrillation  PMH: 1. PAD: H/o aortobifemoral bypass 2. CVA 3. CKD stage 3 4. Gout 5. Hypothyroidism 6. Hyperlipidemia 7. VT: Amiodarone use. 8. Atrial fibrillation: Permanent, in atrial fibrillation since 2021.  9. CAD: S/p CABG.  - LHC (5/23): 99% prox/mid LCx (subtotal occlusion), occluded mid LAD, 95% D1, occluded RCA, occluded SVG-PDA, occluded SVG-ramus, patent LIMA-LAD with collaterals from LAD  to RCA and LCx territories.  10. Chronic systolic CHF: Ischemic cardiomyopathy: [employer] ICD.  - Echo (8/24) with EF 25-30%, moderate LVH, severe LV dilation, mild aortic stenosis, mild MR.  - RHC (12/24): mean RA 3, PA 35/11 mean 21, mean PCWP 14 with v-waves to 21 (only mild MR on last echo, suspect due to diastolic dysfunction), CI 2.55 (Fick).  11. Aortic stenosis: Mild on 8/24 echo.  12. Carotid stenosis: Carotid dopplers 1/25 with 60-79% RICA stenosis (probably closer to 60%).   SH: Married, no smoking/ETOH.  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   Family History  Problem Relation Age of Onset   Heart disease Father    Heart attack Father    Heart disease Sister    Hypertension Sister    Heart attack Sister    Hypertension Mother    Diabetes Son    ROS: All systems reviewed and negative except as per HPI.   Current Outpatient Medications  Medication Sig Dispense Refill   acetaminophen (TYLENOL) 325 MG tablet Take 2 tablets (650 mg total) by mouth every 6 (six) hours as needed for mild pain (or Fever >/= 101). 20 tablet 0   allopurinol (ZYLOPRIM) 100 MG tablet Take 100 mg by mouth daily as needed (gout).     ALPRAZolam (XANAX) 0.5 MG tablet Take 0.5 mg by mouth 3 (three) times daily as needed for anxiety or sleep.     amiodarone (PACERONE) 200 MG tablet Take 1 tablet (200 mg total) by mouth daily. 90 tablet 3   apixaban (ELIQUIS)  5 MG TABS tablet Take 2.5 mg by mouth 2 (two) times daily.     bisacodyl (DULCOLAX) 5 MG EC tablet Take 5 mg by mouth daily.     bisoprolol (ZEBETA) 5 MG tablet Take 1 tablet (5 mg total) by mouth daily. 90 tablet 3   colchicine 0.6 MG tablet Take 1 tablet (0.6 mg total) by mouth 2 (two) times daily. 60 tablet 3   digoxin (LANOXIN) 0.125 MG tablet Take 0.5 tablets (0.0625 mg total) by mouth daily. 30 tablet 11   Evolocumab (REPATHA SURECLICK) 140 MG/ML SOAJ INJECT 140 MILLIGRAMS INTO THE SKIN EVERY 14 DAYS 6 mL 1   losartan  (COZAAR) 25 MG tablet TAKE 1/2 TABLET BY MOUTH DAILY 45 tablet 3   meclizine (ANTIVERT) 12.5 MG tablet Take 2 tablets (25 mg total) by mouth 3 (three) times daily as needed. 90 tablet 3   methocarbamol (ROBAXIN) 500 MG tablet Take 500 mg by mouth daily as needed for muscle spasms (back pain.).     nitroGLYCERIN (NITROSTAT) 0.4 MG SL tablet Place 1 tablet (0.4 mg total) under the tongue every 5 (five) minutes as needed for chest pain. 25 tablet 3   pantoprazole (PROTONIX) 40 MG tablet Take 1 tablet (40 mg total) by mouth daily. 30 tablet 11   polyethylene glycol powder (GLYCOLAX/MIRALAX) 17 GM/SCOOP powder Take 17 g by mouth at bedtime.     potassium chloride SA (KLOR-CON M) 10 MEQ tablet Take 1 tablet (10 mEq total) by mouth daily. 30 tablet 6   ranolazine (RANEXA) 500 MG 12 hr tablet Take 1 tablet (500 mg total) by mouth daily. 45 tablet 3   sacubitril-valsartan (ENTRESTO) 24-26 MG Take 1 tablet by mouth 2 (two) times daily. 60 tablet 11   tobramycin (TOBREX) 0.3 % ophthalmic solution Place 1 drop into the left eye daily as needed (Dry eyes).     torsemide (DEMADEX) 20 MG tablet Take 40 mg by mouth daily.     No current facility-administered medications for this encounter.   BP 130/60   Pulse (!) 49   Wt 63.7 kg (140 lb 6.4 oz)   SpO2 98%   BMI 22.66 kg/m  General: NAD Neck: JVP 7-8 cm with HJR, no thyromegaly or thyroid nodule.  Lungs: Clear to auscultation bilaterally with normal respiratory effort. CV: Nondisplaced PMI.  Heart irregular S1/S2, no S3/S4, 1/6 SEM RUSB.  Trace ankle edema.  No carotid bruit.  Normal pedal pulses.  Abdomen: Soft, nontender, no hepatosplenomegaly, no distention.  Skin: Intact without lesions or rashes.  Neurologic: Alert and oriented x 3.  Psych: Normal affect. Extremities: No clubbing or cyanosis.  HEENT: Normal.   Assessment/Plan: 1. Chronic systolic CHF: Ischemic cardiomyopathy.  [employer] ICD.  Echo in 8/24 showed EF 25-30%, moderate LVH,  severe LV dilation, mild aortic stenosis, mild MR.  RHC in 12/24 showed normal filling pressures, CI 2.55.  He is minimally volume overloaded by exam today and not by HeartLogic (1).  NYHA class III.  GDMT titration has been limited by medication intolerances.  - Continue torsemide 40 mg daily.  - He failed both Jardiance and Farxiga due to nausea. - Stop losartan, start Entresto 24/26 1/2 tab bid.  Increase to full tab bid if he tolerates. BMET/BNP today, BMET in 10 days.  - Continue bisoprolol 5 mg daily.  - Start spironolactone next.  - I think he is a good candidate for baroreceptor activation therapy.  He has seen Dr. Myra Gianotti, and we are waiting  for insurance approval.  2. CAD: s/p CABG.  Cath in 5/23 without interventional option.  Severe disease of native vessels, occluded SVG-ramus and SVG-PDA, patent LIMA-LAD. No chest pain.  - No ASA given Eliquis use.  - Continue Repatha.  3. Atrial fibrillation: Present since 2021, appears permanent. Reasonable rate control.  - Continue apixaban, dosed at 2.5 bid for age > 80, creatinine usually > 1.5.  4. CKD stage 3: BMET today.  5. VT: Last episode in 11/24 terminated by ATP.   - Continue amiodarone 200 mg daily.  Check LFTs and TSH, will need regular eye exam.  6. PAD: H/o aortobifemoral bypass.  He denies claudication.   Followup 6 wks with APP.   Ryan [name] 01/12/2024  -

## 2024-01-13 ENCOUNTER — Telehealth (HOSPITAL_COMMUNITY): Payer: Self-pay | Admitting: *Deleted

## 2024-01-13 DIAGNOSIS — I5042 Chronic combined systolic (congestive) and diastolic (congestive) heart failure: Secondary | ICD-10-CM

## 2024-01-13 DIAGNOSIS — E032 Hypothyroidism due to medicaments and other exogenous substances: Secondary | ICD-10-CM

## 2024-01-13 MED ORDER — LEVOTHYROXINE SODIUM 50 MCG PO TABS: 50.0000 ug | ORAL_TABLET | Freq: Every day | ORAL | 6 refills | Status: DC

## 2024-01-13 NOTE — Telephone Encounter (Signed)
Called patients' wife per Dr. Shirlee Latch with following lab results and instructions: "TSH has been persistently high.  Need to send free T4 and free T4.  With persistently elevated TSH suggesting hypothyroidism (could be related to amiodarone), need to start Levoxyl at 25 mcg daily and repeat TSH in 1 month.  Would also have him get a digoxin level checked as a trough (prior to taking the am dose)" Wife verbalized understanding of same. Requested labs added on to next lab visit (asked wife to have patient hold his am Digoxin prior to this visit). Rx sent to local pharmacy. Will repeat TSH at next [facility] visit.   Asked wife about documented levothyroxine allergy - wife doesn't remember him being allergic to this medication. She will check at home and call us if this a true allergy. She will call [phone] option 2 for further instructions if needed.

## 2024-01-14 DIAGNOSIS — R42 Dizziness and giddiness: Secondary | ICD-10-CM | POA: Diagnosis not present

## 2024-01-14 DIAGNOSIS — D6869 Other thrombophilia: Secondary | ICD-10-CM | POA: Diagnosis not present

## 2024-01-14 DIAGNOSIS — N1831 Chronic kidney disease, stage 3a: Secondary | ICD-10-CM | POA: Diagnosis not present

## 2024-01-14 DIAGNOSIS — I480 Paroxysmal atrial fibrillation: Secondary | ICD-10-CM | POA: Diagnosis not present

## 2024-01-14 DIAGNOSIS — I501 Left ventricular failure: Secondary | ICD-10-CM | POA: Diagnosis not present

## 2024-01-14 DIAGNOSIS — I4729 Other ventricular tachycardia: Secondary | ICD-10-CM | POA: Diagnosis not present

## 2024-01-14 DIAGNOSIS — R7301 Impaired fasting glucose: Secondary | ICD-10-CM | POA: Diagnosis not present

## 2024-01-14 DIAGNOSIS — G720 Drug-induced myopathy: Secondary | ICD-10-CM | POA: Diagnosis not present

## 2024-01-14 DIAGNOSIS — I130 Hypertensive heart and chronic kidney disease with heart failure and stage 1 through stage 4 chronic kidney disease, or unspecified chronic kidney disease: Secondary | ICD-10-CM | POA: Diagnosis not present

## 2024-01-14 DIAGNOSIS — E785 Hyperlipidemia, unspecified: Secondary | ICD-10-CM | POA: Diagnosis not present

## 2024-01-14 DIAGNOSIS — R1319 Other dysphagia: Secondary | ICD-10-CM | POA: Diagnosis not present

## 2024-01-14 DIAGNOSIS — I700 Atherosclerosis of aorta: Secondary | ICD-10-CM | POA: Diagnosis not present

## 2024-01-24 ENCOUNTER — Ambulatory Visit (HOSPITAL_COMMUNITY)
Admission: RE | Admit: 2024-01-24 | Discharge: 2024-01-24 | Disposition: A | Discharge status: Home / Self Care | Payer: PPO | Source: Ambulatory Visit | Attending: Internal Medicine | Admitting: Internal Medicine

## 2024-01-24 DIAGNOSIS — E032 Hypothyroidism due to medicaments and other exogenous substances: Secondary | ICD-10-CM | POA: Diagnosis not present

## 2024-01-24 DIAGNOSIS — I5042 Chronic combined systolic (congestive) and diastolic (congestive) heart failure: Secondary | ICD-10-CM | POA: Insufficient documentation

## 2024-01-24 LAB — BASIC METABOLIC PANEL
Anion gap: 11 (ref 5–15)
BUN: 24 mg/dL — ABNORMAL HIGH (ref 8–23)
CO2: 25 mmol/L (ref 22–32)
Calcium: 9.1 mg/dL (ref 8.9–10.3)
Chloride: 105 mmol/L (ref 98–111)
Creatinine, Ser: 1.49 mg/dL — ABNORMAL HIGH (ref 0.61–1.24)
GFR, Estimated: 47 mL/min — ABNORMAL LOW (ref >60)
Glucose, Bld: 132 mg/dL — ABNORMAL HIGH (ref 70–99)
Potassium: 4 mmol/L (ref 3.5–5.1)
Sodium: 141 mmol/L (ref 135–145)

## 2024-01-24 LAB — DIGOXIN LEVEL: Digoxin Level: 1 ng/mL (ref 0.8–2.0)

## 2024-01-24 LAB — T4, FREE: Free T4: 0.85 ng/dL (ref 0.61–1.12)

## 2024-01-25 LAB — T3, FREE: T3, Free: 2.1 pg/mL (ref 2.0–4.4)

## 2024-01-26 ENCOUNTER — Telehealth (HOSPITAL_COMMUNITY): Payer: Self-pay

## 2024-01-26 MED ORDER — DIGOXIN 125 MCG PO TABS: 0.0625 mg | ORAL_TABLET | ORAL | Status: DC

## 2024-01-26 NOTE — Telephone Encounter (Signed)
 Spoke with patients wife ( okay per DPR)-  made her aware of instructions. Patient will take digoxin  every other day. Medication list updated. Advised her to call back with any issues, questions, or concerns. Patients wife verbalized understanding of all instructions.

## 2024-01-26 NOTE — Telephone Encounter (Signed)
-----   Message from Ezra Shuck sent at 01/25/2024  3:36 PM EST ----- Decrease digoxin  to 0.0625 mg every other day. ----- Message ----- From: Jerona Dalton HERO, CMA Sent: 01/25/2024  11:04 AM EST To: Ezra GORMAN Shuck, MD  Pt returned call-reports he DID NOT take any medications prior to lab appt

## 2024-02-07 ENCOUNTER — Other Ambulatory Visit (HOSPITAL_COMMUNITY): Payer: PPO

## 2024-02-08 ENCOUNTER — Telehealth: Payer: Self-pay

## 2024-02-08 ENCOUNTER — Other Ambulatory Visit: Payer: Self-pay

## 2024-02-08 DIAGNOSIS — I5040 Unspecified combined systolic (congestive) and diastolic (congestive) heart failure: Secondary | ICD-10-CM

## 2024-02-08 NOTE — Telephone Encounter (Signed)
Called pt to schedule his barostim surgery. Left VM for him to return our call .

## 2024-02-09 NOTE — Progress Notes (Signed)
 Remote ICD transmission.

## 2024-02-11 NOTE — Progress Notes (Signed)
 Cardiology Office Note:  .   Date:  02/11/2024  ID:  BLANTON KARDELL, DOB December 26, 1940, MRN [id number] PCP: Cleatis Polka., MD  [facility] Providers Cardiologist:  Truett Mainland, MD PCP: Cleatis Polka., MD  Chief Complaint  Patient presents with   HFrEF      History of Present Illness: .    CHASON MCIVER is a 83 y.o. male with CAD s/p CABG (patent LIMA-LAD, occluded SVG-ramus/OM Y graft, occluded SVG-PDA), ischemic cardiomyopathy, s/p ICD, NSVT, aortic stenosis, persistent Afib, PAD s/p aortobifemoral bypass, mod carotid disease, h/o stroke, CKD III   GDMT for HFrEF has been largely limited due to multiple side effects, lightheadedness.  He was evaluated by heart failure specialist Dr. Shirlee Latch in 12/2023 and was found to be a good candidate for baroreceptor activation therapy.  He was also seen by Dr. Myra Gianotti.   He is doing well.  He denies any worsening of exertional dyspnea, leg edema, orthopnea symptoms.   Vitals:   02/15/24 1113 02/15/24 1116  BP: 136/60   Pulse: (!) 55   SpO2: (!) 89% 93%      ROS:  Review of Systems  Cardiovascular:  Negative for chest pain, dyspnea on exertion, leg swelling, palpitations and syncope.     Studies Reviewed: Marland Kitchen        EKG 02/15/2024: Sinus bradycardia with 1st degree A-V block Low voltage QRS Cannot rule out Anterior infarct , age undetermined When compared with ECG of 12-Jan-2024 15:01, Sinus rhythm has replaced Electronic ventricular pacemaker  RHC 11/2023: 1. Normal RA pressure and normal mean PCWP, but prominent V-waves in PCWP tracing likely reflective of diastolic dysfunction (no significant MR on last echo).  2. Borderline elevated PA pressure.  3. Preserved CO by Fick but low by thermodilution.  Based on current improved symptoms, suspect Fick may be more accurate.   Independently interpreted 01/2024: Cr 1.49, eGFR 47 TSH 24  Labs 07/2023: Cr 1.61, eGFR 42 Hb 11.7  04/2023: TSH  38  Echocardiogram 08/10/2023: Left ventricle cavity is normal in size. Moderate concentric hypertrophy of the left ventricle. Severe global hypokinesis. LVEF 25-30%. Unable to evaluate diastolic function due to atrial fibrillation.  Left atrial cavity is severely dilated. Trileaflet aortic valve with moderate aortic valve leaflet calcification. No regurgitation. Mild aortic stenosis. Vmax 2.0 m/sec, mean PG 8 mmHg, AVA 0.7 cm by continuity equation. Cannot exclude low flow low gradient aortic stenosis. Mild (Grade I) mitral regurgitation. Mild tricuspid regurgitation.   No evidence of pulmonary hypertension. Previous study on 04/16/2023 reported estimated PASP 48 mmHg.   Risk Assessment/Calculations:    CHA2DS2-VASc Score = 7  This indicates a 11.2% annual risk of stroke. The patient's score is based upon: CHF History: 1 HTN History: 1 Diabetes History: 0 Stroke History: 2 Vascular Disease History: 1 Age Score: 2 Gender Score: 0    Physical Exam:   Physical Exam Vitals and nursing note reviewed.  Constitutional:      General: He is not in acute distress. Neck:     Vascular: No JVD.  Cardiovascular:     Rate and Rhythm: Normal rate. Rhythm irregular.     Heart sounds: Murmur heard.     Harsh midsystolic murmur is present with a grade of 2/6 at the upper right sternal border radiating to the neck.     High-pitched blowing holosystolic murmur of grade 2/6 is also present at the apex.  Pulmonary:     Effort: Pulmonary effort is  normal.     Breath sounds: Normal breath sounds. No wheezing or rales.  Musculoskeletal:     Right lower leg: No edema.     Left lower leg: No edema.      VISIT DIAGNOSES:   ICD-10-CM   1. HFrEF (heart failure with reduced ejection fraction) (HCC)  I50.20 EKG 12-Lead    ECHOCARDIOGRAM COMPLETE    2. VT (ventricular tachycardia) (HCC)  I47.20 EKG 12-Lead    3. Nonrheumatic aortic valve stenosis  I35.0 EKG 12-Lead    ECHOCARDIOGRAM COMPLETE         ASSESSMENT AND PLAN: .    CONARD ALVIRA is a 83 y.o. male with CAD s/p CABG (patent LIMA-LAD, occluded SVG-ramus/OM Y graft, occluded SVG-PDA), ischemic cardiomyopathy, s/p ICD, NSVT, aortic stenosis, persistent Afib, PAD s/p aortobifemoral bypass, mod carotid disease, h/o stroke, CKD III   HFrEF: Ischemic cardiomyopathy NYHA class III s/p ICD Fairly euvolemic on physical exam. GDMT for heart failure has been severely limited due to his ability to take medications without having side effects including dizziness and nausea. Currently on bisoprolol 5 mg daily, digoxin 1/2 tablet of 0.125 mg every other day, Entresto 1/2 tablet of 24-26 mg twice daily. He is seeing Dr. Shirlee Latch for heart failure follow-up, next on 3/5.  I have not made any further changes to his medications today.   NSVT: Ischemic cardiomyopathy, has ICD with h/o ATP for VT episodes. Currently on amiodarone 200 mg daily.  Aortic stenosis: Previous echocardiograms were performed at [facility] cardiovascular-most recently on 08/10/2023, personally reviewed by me. While low-flow low gradient severe arctic stenosis cannot be excluded, I suspect his echocardiogram is moderate at worst.   Given that he is now in sinus rhythm, will repeat echocardiogram at this time to reassess his EF and aortic stenosis.  CAD: No angina symptoms on current anti anginal therapy.    Persistent A. fib/atrial flutter: Very surprisingly, he is in sinus rhythm for the first time in a very long time.   Up until now, it was felt that his atrial fibrillation is permanent.  I am not sure what has changed, but it is a pleasant surprise.   High CHA2DS2VAsc score Continue Eliquis 2.5 mg daily.     CKD stage III: Stable   PAD: S/p aortobifem bypass.  No critical limb ischemia.     F/u in 6 months  Signed, Elder Negus, MD

## 2024-02-15 ENCOUNTER — Encounter: Payer: Self-pay | Admitting: Cardiology

## 2024-02-15 ENCOUNTER — Ambulatory Visit: Payer: PPO | Attending: Cardiology | Admitting: Cardiology

## 2024-02-15 VITALS — BP 136/60 | HR 55 | Ht 66.0 in | Wt 138.8 lb

## 2024-02-15 DIAGNOSIS — I350 Nonrheumatic aortic (valve) stenosis: Secondary | ICD-10-CM | POA: Diagnosis not present

## 2024-02-15 DIAGNOSIS — I5020 Unspecified systolic (congestive) heart failure: Secondary | ICD-10-CM | POA: Diagnosis not present

## 2024-02-15 DIAGNOSIS — I472 Ventricular tachycardia, unspecified: Secondary | ICD-10-CM

## 2024-02-15 NOTE — Patient Instructions (Signed)
 Medication Instructions:   Your physician recommends that you continue on your current medications as directed. Please refer to the Current Medication list given to you today.  *If you need a refill on your cardiac medications before your next appointment, please call your pharmacy*   Testing/Procedures:  Your physician has requested that you have an echocardiogram. Echocardiography is a painless test that uses sound waves to create images of your heart. It provides your doctor with information about the size and shape of your heart and how well your heart's chambers and valves are working. This procedure takes approximately one hour. There are no restrictions for this procedure. Please do NOT wear cologne, perfume, aftershave, or lotions (deodorant is allowed). Please arrive 15 minutes prior to your appointment time.  Please note: We ask at that you not bring children with you during ultrasound (echo/ vascular) testing. Due to room size and safety concerns, children are not allowed in the ultrasound rooms during exams. Our front office staff cannot provide observation of children in our lobby area while testing is being conducted. An adult accompanying a patient to their appointment will only be allowed in the ultrasound room at the discretion of the ultrasound technician under special circumstances. We apologize for any inconvenience.    Follow-Up: At [facility], you and your health needs are our priority.  As part of our continuing mission to provide you with exceptional heart care, we have created designated Provider Care Teams.  These Care Teams include your primary Cardiologist (physician) and Advanced Practice Providers (APPs -  Physician Assistants and Nurse Practitioners) who all work together to provide you with the care you need, when you need it.  We recommend signing up for the patient portal called "MyChart".  Sign up information is provided on this After Visit Summary.   MyChart is used to connect with patients for Virtual Visits (Telemedicine).  Patients are able to view lab/test results, encounter notes, upcoming appointments, etc.  Non-urgent messages can be sent to your provider as well.   To learn more about what you can do with MyChart, go to [email or web address].    Your next appointment:   6 month(s)  Provider:   Elder Negus, MD     Other Instructions    1st Floor: - Lobby - Registration  - Pharmacy  - Lab - Cafe  2nd Floor: - PV Lab - Diagnostic Testing (echo, CT, nuclear med)  3rd Floor: - Vacant  4th Floor: - TCTS (cardiothoracic surgery) - AFib Clinic - Structural Heart Clinic - Vascular Surgery  - Vascular Ultrasound  5th Floor: - HeartCare Cardiology (general and EP) - Clinical Pharmacy for coumadin, hypertension, lipid, weight-loss medications, and med management appointments    Valet parking services will be available as well.

## 2024-02-22 ENCOUNTER — Encounter (HOSPITAL_COMMUNITY): Payer: Self-pay | Admitting: Surgery

## 2024-02-22 ENCOUNTER — Other Ambulatory Visit: Payer: Self-pay

## 2024-02-22 NOTE — Progress Notes (Signed)
 Anesthesia Chart Review: SAME DAY WORK-UP  Case: [id number] Date/Time: 02/23/24 0815   Procedure: INSERTION OF RIGHT BAROSTIM (Right)   Anesthesia type: General   Pre-op diagnosis: Combined systolic and diastolic congestive heart failure, NYHA class 3, unspecified congestive heart failure chronicity   [facility] ROOM 16 / [facility]   Surgeons: Nada Libman, MD       DISCUSSION: Patient is an 83 year old male scheduled for the above procedure.  History former smoker (quit 02/04/87), HTN, HLD, CAD (MI x2 1980's with occluded RCA, distal CX; s/p CABG: LIMA-LAD, Y-SVG-D1a-D1b, SVG-PDA 04/05/15; Y-SVG occluded 04/22/18, medical therapy; partially successful PTCA LCX 04/14/22; stable anatomy, continue medical therapy 04/24/22), ischemic cardiomyopathy, chronic combined systolic and diastolic CHF, [employer] ICD (12/30/20 following VT; on amiodarone), afib/PAF (2018), prolonged QT interval, aortic stenosis (mild 07/2023), PAD (AFBG 1987), carotid artery disease (s/p left carotid endarterectomy 04/24/16), BPH, GERD, CKD (stage III), PE (intermediate risk VQ scan for PE 03/21/15; negative chest CTA 04/01/15), DVT, exertional dyspnea, hypothyroidism, spinal surgeries (L4-5 laminectomy/foraminotomoy 02/12/12; L3-4 decompression 01/06/13), osteoarthritis (left THA 01/09/14; right THA 08/15/19).   Last cardiology visit with Dr. Rosemary Holms was on 02/15/24. Last Eliquis 02/17/24. Per posting by [facility] staff: "[employer] ICD Rep/Joey Deakins and Barostim Rep/Kyle [employer] notified."  Anesthesia team to evaluate on the day of procedure.   VS:  Wt Readings from Last 3 Encounters:  02/15/24 63 kg  01/12/24 63.7 kg  01/10/24 64.4 kg   BP Readings from Last 3 Encounters:  02/15/24 136/60  01/12/24 130/60  01/10/24 (!) 150/81   Pulse Readings from Last 3 Encounters:  02/15/24 (!) 55  01/12/24 (!) 49  01/10/24 61     PROVIDERS: Cleatis Polka., MD is PCP  Truett Mainland, MD is cardiologist Marca Ancona,  MD is HF cardiologist Lewayne Bunting, MD is EP cardiologist   LABS:  Lab Results  Component Value Date   WBC 7.8 11/11/2023   HGB 12.6 (L) 11/22/2023   HGB 12.6 (L) 11/22/2023   HCT 37.0 (L) 11/22/2023   HCT 37.0 (L) 11/22/2023   PLT 297 11/11/2023   GLUCOSE 132 (H) 01/24/2024   ALT 15 01/12/2024   AST 29 01/12/2024   NA 141 01/24/2024   K 4.0 01/24/2024   CL 105 01/24/2024   CREATININE 1.49 (H) 01/24/2024   BUN 24 (H) 01/24/2024   CO2 25 01/24/2024   TSH 28.439 (H) 01/12/2024    EKG: EKG 02/15/2024: Sinus bradycardia with 1st degree A-V block Low voltage QRS Cannot rule out Anterior infarct , age undetermined When compared with ECG of 12-Jan-2024 15:01, Sinus rhythm has replaced Electronic ventricular pacemaker Confirmed by Truett Mainland [phone]) on 02/15/2024 11:12:25 AM   CV: US Carotid 01/10/2024: Summary:  - Right Carotid: Velocities in the right ICA are consistent with a 60-79% stenosis. Non-hemodynamically significant plaque <50% noted in the  CCA. The ECA appears <50% stenosed.  - Left Carotid: Velocities in the left ICA are consistent with a 1-39% stenosis. Non-hemodynamically significant plaque <50% noted in the CCA. The ECA appears <50% stenosed.  - Vertebrals: Bilateral vertebral arteries demonstrate antegrade flow. Right vertebral artery demonstrates high resistant flow.  - Increased PSV within the right ICA compared to prior exam 10/29/2021 (172 cm/s to 236 cm/s).   RHC 11/22/2023: 1. Normal RA pressure and normal mean PCWP, but prominent V-waves in PCWP tracing likely reflective of diastolic dysfunction (no significant MR on last echo).  2. Borderline elevated PA pressure.  3. Preserved CO  by Fick but low by thermodilution.  Based on current improved symptoms, suspect Fick may be more accurate.  - I will refer him for barostimulator evaluation.    Echocardiogram 08/10/2023:  Left ventricle cavity is normal in size. Moderate concentric hypertrophy  of  the left ventricle. Severe global hypokinesis. LVEF 25-30%. Unable to  evaluate diastolic function due to atrial fibrillation.  Left atrial cavity is severely dilated.  Trileaflet aortic valve with moderate aortic valve leaflet calcification.  No regurgitation. Mild aortic stenosis. Vmax 2.0 m/sec, mean PG 8 mmHg,  AVA 0.7 cm by continuity equation. Cannot exclude low flow low gradient  aortic stenosis.  Mild (Grade I) mitral regurgitation.  Mild tricuspid regurgitation.   No evidence of pulmonary hypertension.  Previous study on 04/16/2023 reported estimated PASP 48 mmHg.    Left Heart Catheterization 04/24/22:  LV: 121/8, EDP 12 mmHg.  Ao 106/53, mean 71 mmHg.  There was no significant pressure gradient across the aortic valve. LV: The entire inferior wall is akinetic.  Basal and mid inferior wall is aneurysmal.  LVEF 35 to 40% due to aneurysmal formation. LM: Short, widely patent.  Mild calcifications evident. LAD: Large vessel however severely calcified and diffusely diseased throughout proximal and mid segment and is occluded in the distal segment.  LIMA to LAD previously widely patent and gives by 3 collaterals to occluded RCA.  Large D1 again severely diffusely diseased. RI: Large vessel, ostial 40 to 50% stenosis.  Mild disease in the midsegment. CX: Large vessel however severely diffusely diseased all the way from the proximal to the distal segment.  The proximal circumflex is again severely calcified, ostium is widely patent at the previous angioplasty site, the mid segment appears to be a CTO with bridging collaterals again heavily calcified.  The AV groove circumflex is subtotally occluded with bridging collaterals.  A moderate-sized OM1 with mild disease. RCA: Occluded.  Distal RCA is collateralized by type III collaterals from the LAD and also from SP1.   Impression: Severe diffuse native vessel disease with occluded venous graft to circumflex, RCA and diagonal, LIMA to LAD is  widely patent by previous angiography a week ago and is not cannulated. The circumflex lesion although high-grade, will not improve his functionality.  The whole native circumflex is diffusely diseased.  There is no change in coronary anatomy overall over the past 2 months he has had angiography.  There is still TIMI-3 flow.  I suspect CTO of the mid circumflex with bridging collaterals.  On LV gram, the entire inferior wall is akinetic and basal and mid inferior wall is aneurysmal and although the anterolateral wall is hyperdynamic, the efficiency of the LV is markedly reduced in view of the aneurysm formation and suspect his etiology for recurrent heart failure is related to decreased cardiac output.  Recommend therapy for heart failure with reduced LVEF.  I have started him on isosorbide dinitrate along with hydralazine as he is not able to take ARB or Entresto due to renal failure.  We will also transition him from furosemide to torsemide.  Home tomorrow if he remains stable.  30 mL contrast utilized.   Past Medical History:  Diagnosis Date   Acquired thrombophilia (HCC) 11/01/2021   AICD (automatic cardioverter/defibrillator) present    [employer]   Arthritis    CAD (coronary artery disease)    Carotid artery occlusion    Cataract    Bil eyes/worse in left eye   CHF (congestive heart failure) (HCC)    Chronic  back pain    COVID-19 10/31/2021   DVT (deep venous thrombosis) (HCC)    Dysrhythmia    Enlarged prostate    takes Rapaflo daily   GERD (gastroesophageal reflux disease)    occasional   History of colon polyps    History of gout    has colchicine prn   History of kidney stones    Hyperlipidemia    takes Crestor daily   Hypertension    takes Amlodipine daily   Hypothyroidism    Myocardial infarction [facility])    Peripheral vascular disease (HCC)    Prolonged QT interval 11/01/2021   Pulmonary emboli (HCC) 03/20/2015   elevated d-dimer, intermediate V/Q study,  atypical chest pain and SOB. Start on Xarelto 20mg  BID for 3 month   Rapid atrial fibrillation (HCC)    Renal insufficiency    Shortness of breath dyspnea    Urinary frequency    Urinary urgency     Past Surgical History:  Procedure Laterality Date   APPENDECTOMY     BACK SURGERY     5 times   big toe surgery     CARDIAC CATHETERIZATION     2010    dr Elease Hashimoto   cataract surgery     left eye   CHOLECYSTECTOMY N/A 07/27/2016   Procedure: LAPAROSCOPIC CHOLECYSTECTOMY;  Surgeon: Rodman Pickle, MD;  Location: [facility] OR;  Service: General;  Laterality: N/A;   COLONOSCOPY     CORONARY ARTERY BYPASS GRAFT N/A 04/05/2015   Procedure: CORONARY ARTERY BYPASS GRAFTING (CABG)X4 LIMA-LAD; SVG-DIAG1-DIAG2; SVG-PD;  Surgeon: Loreli Slot, MD;  [facility];  Service: Open Heart Surgery;  Laterality: N/A;   CORONARY BALLOON ANGIOPLASTY N/A 04/14/2022   Procedure: CORONARY BALLOON ANGIOPLASTY;  Surgeon: Elder Negus, MD;  [facility]: [facility] INVASIVE CV LAB;  Service: Cardiovascular;  Laterality: N/A;   CORONARY ULTRASOUND/IVUS N/A 04/14/2022   Procedure: Intravascular Ultrasound/IVUS;  Surgeon: Elder Negus, MD;  [facility] INVASIVE CV LAB;  Service: Cardiovascular;  Laterality: N/A;   CORONARY/GRAFT ANGIOGRAPHY N/A 04/22/2018   Procedure: CORONARY/GRAFT ANGIOGRAPHY;  Surgeon: Swazil[place], Peter M, MD;  Location: [facility] INVASIVE CV LAB;  Service: Cardiovascular;  Laterality: N/A;   CYSTOSCOPY     ENDARTERECTOMY Left 04/24/2016   Procedure: ENDARTERECTOMY LEFT CAROTID;  Surgeon: Pryor Ochoa, MD;  Location: [facility] OR;  Service: Vascular;  Laterality: Left;   EYE SURGERY     FEMORAL ARTERY - POPLITEAL ARTERY BYPASS GRAFT     ICD IMPLANT N/A 12/30/2020   Procedure: ICD IMPLANT;  Surgeon: Marinus Maw, MD;  [facility] INVAS[facility] CV LAB;  Service: Cardiovascular;  Laterality: N/A;   JOINT REPLACEMENT     shoulder   LEFT HEART CATH N/A 04/14/2022   Procedure: Left Heart Cath;  Surgeon:  Elder Negus, MD;  [facility] INVASIVE CV LAB;  Service: Cardiovascular;  Laterality: N/A;   LEFT HEART CATH AND CORONARY ANGIOGRAPHY N/A 04/24/2022   Procedure: LEFT HEART CATH AND CORONARY ANGIOGRAPHY;  Surgeon: Yates Decamp, MD;  [facility] INVASIVE CV LAB;  Service: Cardiovascular;  Laterality: N/A;   LEFT HEART CATHETERIZATION WITH CORONARY ANGIOGRAM N/A 04/03/2015   Procedure: LEFT HEART CATHETERIZATION WITH CORONARY ANGIOGRAM;  Surgeon: Lennette Bihari, MD;  Location: [facility] CATH LAB;  Service: Cardiovascular;  Laterality: N/A;   LUMBAR LAMINECTOMY  01/06/2013   Procedure: MICRODISCECTOMY LUMBAR LAMINECTOMY;  Surgeon: Eldred Manges, MD;  [facility];  Service: Orthopedics;  Laterality: N/A;  L3-4 decompression   LUMBAR LAMINECTOMY/DECOMPRESSION  MICRODISCECTOMY  02/12/2012   Procedure: LUMBAR LAMINECTOMY/DECOMPRESSION MICRODISCECTOMY;  Surgeon: [name], MD;  [facility] NEURO ORS;  Service: Neurosurgery;  Laterality: N/A;  Lumbar four-five laminectomy   PATCH ANGIOPLASTY Left 04/24/2016   Procedure: LEFT CAROTID ARTERY PATCH ANGIOPLASTY;  Surgeon: Pryor Ochoa, MD;  Location: [facility] OR;  Service: Vascular;  Laterality: Left;   RIGHT HEART CATH N/A 11/22/2023   Procedure: RIGHT HEART CATH;  Surgeon: Laurey Morale, MD;  Location: [facility] INVASIVE CV LAB;  Service: Cardiovascular;  Laterality: N/A;   RIGHT HEART CATH AND CORONARY/GRAFT ANGIOGRAPHY N/A 01/30/2019   Procedure: RIGHT HEART CATH AND CORONARY/GRAFT ANGIOGRAPHY;  Surgeon: Lennette Bihari, MD;  [facility] INV[facility] CV LAB;  Service: Cardiovascular;  Laterality: N/A;   RIGHT/LEFT HEART CATH AND CORONARY ANGIOGRAPHY N/A 03/17/2022   Procedure: RIGHT/LEFT HEART CATH AND CORONARY ANGIOGRAPHY;  Surgeon: Elder Negus, MD;  [facility] INVAS[facility] CV LAB;  Service: Cardiovascular;  Laterality: N/A;   STERIOD INJECTION Right 01/09/2014   Procedure: STEROID INJECTION;  Surgeon: Kathryne Hitch, MD;  Location: [facility] OR;  Service:  Orthopedics;  Laterality: Right;   TEE WITHOUT CARDIOVERSION N/A 04/05/2015   Procedure: TRANSESOPHAGEAL ECHOCARDIOGRAM (TEE);  Surgeon: Loreli Slot, MD;  Location: [facility] OR;  Service: Open Heart Surgery;  Laterality: N/A;   TOTAL HIP ARTHROPLASTY Left 01/09/2014   DR Magnus Ivan   TOTAL HIP ARTHROPLASTY Left 01/09/2014   Procedure: LEFT TOTAL HIP ARTHROPLASTY ANTERIOR APPROACH and Steroid Injection Right hip;  Surgeon: Kathryne Hitch, MD;  [facility];  Service: Orthopedics;  Laterality: Left;   TOTAL HIP ARTHROPLASTY Right 08/15/2019   TOTAL HIP ARTHROPLASTY Right 08/15/2019   Procedure: RIGHT TOTAL HIP ARTHROPLASTY ANTERIOR APPROACH;  Surgeon: Kathryne Hitch, MD;  [facility] OR;  Service: Orthopedics;  Laterality: Right;    MEDICATIONS: No current facility-administered medications for this encounter.    acetaminophen (TYLENOL) 325 MG tablet   ALPRAZolam (XANAX) 0.5 MG tablet   amiodarone (PACERONE) 200 MG tablet   apixaban (ELIQUIS) 5 MG TABS tablet   bisacodyl (DULCOLAX) 5 MG EC tablet   bisoprolol (ZEBETA) 5 MG tablet   colchicine 0.6 MG tablet   digoxin (LANOXIN) 0.125 MG tablet   Evolocumab (REPATHA SURECLICK) 140 MG/ML SOAJ   levothyroxine (SYNTHROID) 50 MCG tablet   losartan (COZAAR) 25 MG tablet   meclizine (ANTIVERT) 12.5 MG tablet   methocarbamol (ROBAXIN) 500 MG tablet   nitroGLYCERIN (NITROSTAT) 0.4 MG SL tablet   pantoprazole (PROTONIX) 40 MG tablet   polyethylene glycol powder (GLYCOLAX/MIRALAX) 17 GM/SCOOP powder   potassium chloride SA (KLOR-CON M) 10 MEQ tablet   ranolazine (RANEXA) 500 MG 12 hr tablet   sacubitril-valsartan (ENTRESTO) 24-26 MG   tobramycin (TOBREX) 0.3 % ophthalmic solution   torsemide (DEMADEX) 20 MG tablet   allopurinol (ZYLOPRIM) 100 MG tablet    Shonna Chock, PA-C Surgical Short Stay/Anesthesiology [facility] Phone [phone] [facility] Phone [phone] 02/22/2024 12:09 PM

## 2024-02-22 NOTE — Anesthesia Preprocedure Evaluation (Signed)
 Anesthesia Evaluation  Patient identified by MRN, date of birth, ID band Patient awake    Reviewed: Allergy & Precautions, NPO status , Patient's Chart, lab work & pertinent test results  Airway Mallampati: II  TM Distance: >3 FB Neck ROM: Full    Dental  (+) Edentulous Upper, Dental Advisory Given   Pulmonary former smoker   breath sounds clear to auscultation       Cardiovascular hypertension, Pt. on medications + angina  + CAD, + Past MI, + Peripheral Vascular Disease and +CHF  + dysrhythmias Atrial Fibrillation + Cardiac Defibrillator  Rhythm:Regular Rate:Normal  Echo:  Left ventricle cavity is normal in size. Moderate concentric hypertrophy  of the left ventricle. Severe global hypokinesis. LVEF 25-30%. Unable to  evaluate diastolic function due to atrial fibrillation.  Left atrial cavity is severely dilated.  Trileaflet aortic valve with moderate aortic valve leaflet calcification.  No regurgitation. Mild aortic stenosis. Vmax 2.0 m/sec, mean PG 8 mmHg,  AVA 0.7 cm by continuity equation. Cannot exclude low flow low gradient  aortic stenosis.  Mild (Grade I) mitral regurgitation.  Mild tricuspid regurgitation.   No evidence of pulmonary hypertension.     Neuro/Psych negative neurological ROS  negative psych ROS   GI/Hepatic Neg liver ROS,GERD  Medicated,,  Endo/Other  Hypothyroidism    Renal/GU Renal disease     Musculoskeletal  (+) Arthritis ,    Abdominal   Peds  Hematology negative hematology ROS (+)   Anesthesia Other Findings   Reproductive/Obstetrics                             Anesthesia Physical Anesthesia Plan  ASA: 4  Anesthesia Plan: General   Post-op Pain Management: Minimal or no pain anticipated   Induction: Intravenous  PONV Risk Score and Plan: 3 and Ondansetron, Dexamethasone and Midazolam  Airway Management Planned: Oral ETT  Additional  Equipment: Arterial line  Intra-op Plan:   Post-operative Plan: Extubation in OR  Informed Consent: I have reviewed the patients History and Physical, chart, labs and discussed the procedure including the risks, benefits and alternatives for the proposed anesthesia with the patient or authorized representative who has indicated his/her understanding and acceptance.     Dental advisory given  Plan Discussed with: CRNA  Anesthesia Plan Comments: (PAT note written 02/22/2024 by Shonna Chock, PA-C.  )       Anesthesia Quick Evaluation

## 2024-02-22 NOTE — Progress Notes (Signed)
 SDW CALL  Patient and wife were given pre-op instructions over the phone. The opportunity was given for the patient and wife to ask questions. No further questions asked. Patient's wife verbalized understanding of instructions given.   PCP - Martha Clan Cardiologist - Manish Patwardhan   PPM/ICD - ICD - [employer] Device Orders - per Dr. Myra Gianotti, device orders are not needed Rep Notified - per office, Joey has been notified  Chest x-ray -  EKG - 02/15/24 Stress Test -  ECHO - 08/10/23 Cardiac Cath - 11/22/23  Sleep Study - denies CPAP - n/a  No DM  Last dose of GLP1 agonist-  n/a GLP1 instructions: n/a  Blood Thinner Instructions: last dose of Eliquis was 2/27 Aspirin Instructions: n/a  ERAS Protcol - NPO   COVID TEST- n/a   Anesthesia review: yes   Patient denies shortness of breath, fever, cough and chest pain over the phone call   All instructions explained to the patient, with a verbal understanding of the material. Patient agrees to go over the instructions while at home for a better understanding.

## 2024-02-23 ENCOUNTER — Encounter (HOSPITAL_COMMUNITY): Payer: Self-pay | Admitting: Surgery

## 2024-02-23 ENCOUNTER — Other Ambulatory Visit: Payer: Self-pay

## 2024-02-23 ENCOUNTER — Encounter (HOSPITAL_COMMUNITY): Payer: PPO

## 2024-02-23 ENCOUNTER — Observation Stay (HOSPITAL_COMMUNITY)
Admission: RE | Admit: 2024-02-23 | Discharge: 2024-02-24 | Disposition: A | Discharge status: Home / Self Care | Payer: PPO | Attending: Surgery | Admitting: Surgery

## 2024-02-23 ENCOUNTER — Encounter (HOSPITAL_COMMUNITY)
Admission: RE | Disposition: A | Discharge status: Home / Self Care | Payer: Self-pay | Source: Home / Self Care | Attending: Surgery

## 2024-02-23 ENCOUNTER — Ambulatory Visit (HOSPITAL_COMMUNITY): Payer: Self-pay | Admitting: Vascular Surgery

## 2024-02-23 ENCOUNTER — Ambulatory Visit (HOSPITAL_BASED_OUTPATIENT_CLINIC_OR_DEPARTMENT_OTHER): Payer: Self-pay | Admitting: Vascular Surgery

## 2024-02-23 DIAGNOSIS — I110 Hypertensive heart disease with heart failure: Secondary | ICD-10-CM | POA: Diagnosis not present

## 2024-02-23 DIAGNOSIS — E785 Hyperlipidemia, unspecified: Secondary | ICD-10-CM | POA: Diagnosis not present

## 2024-02-23 DIAGNOSIS — Z95810 Presence of automatic (implantable) cardiac defibrillator: Secondary | ICD-10-CM | POA: Diagnosis not present

## 2024-02-23 DIAGNOSIS — N1831 Chronic kidney disease, stage 3a: Secondary | ICD-10-CM | POA: Diagnosis not present

## 2024-02-23 DIAGNOSIS — I2510 Atherosclerotic heart disease of native coronary artery without angina pectoris: Secondary | ICD-10-CM | POA: Insufficient documentation

## 2024-02-23 DIAGNOSIS — Z86711 Personal history of pulmonary embolism: Secondary | ICD-10-CM | POA: Insufficient documentation

## 2024-02-23 DIAGNOSIS — I5042 Chronic combined systolic (congestive) and diastolic (congestive) heart failure: Secondary | ICD-10-CM

## 2024-02-23 DIAGNOSIS — Z86718 Personal history of other venous thrombosis and embolism: Secondary | ICD-10-CM | POA: Insufficient documentation

## 2024-02-23 DIAGNOSIS — Z951 Presence of aortocoronary bypass graft: Secondary | ICD-10-CM | POA: Insufficient documentation

## 2024-02-23 DIAGNOSIS — Z79899 Other long term (current) drug therapy: Secondary | ICD-10-CM | POA: Insufficient documentation

## 2024-02-23 DIAGNOSIS — Z7901 Long term (current) use of anticoagulants: Secondary | ICD-10-CM | POA: Diagnosis not present

## 2024-02-23 DIAGNOSIS — I739 Peripheral vascular disease, unspecified: Secondary | ICD-10-CM | POA: Diagnosis not present

## 2024-02-23 DIAGNOSIS — K219 Gastro-esophageal reflux disease without esophagitis: Secondary | ICD-10-CM | POA: Diagnosis not present

## 2024-02-23 DIAGNOSIS — I255 Ischemic cardiomyopathy: Secondary | ICD-10-CM | POA: Insufficient documentation

## 2024-02-23 DIAGNOSIS — Z8616 Personal history of COVID-19: Secondary | ICD-10-CM | POA: Diagnosis not present

## 2024-02-23 DIAGNOSIS — I252 Old myocardial infarction: Secondary | ICD-10-CM | POA: Insufficient documentation

## 2024-02-23 DIAGNOSIS — Z87891 Personal history of nicotine dependence: Secondary | ICD-10-CM | POA: Diagnosis not present

## 2024-02-23 DIAGNOSIS — Z9889 Other specified postprocedural states: Secondary | ICD-10-CM | POA: Diagnosis not present

## 2024-02-23 DIAGNOSIS — I509 Heart failure, unspecified: Secondary | ICD-10-CM | POA: Diagnosis present

## 2024-02-23 DIAGNOSIS — I130 Hypertensive heart and chronic kidney disease with heart failure and stage 1 through stage 4 chronic kidney disease, or unspecified chronic kidney disease: Secondary | ICD-10-CM

## 2024-02-23 DIAGNOSIS — I5040 Unspecified combined systolic (congestive) and diastolic (congestive) heart failure: Secondary | ICD-10-CM

## 2024-02-23 DIAGNOSIS — I4821 Permanent atrial fibrillation: Secondary | ICD-10-CM | POA: Diagnosis not present

## 2024-02-23 DIAGNOSIS — E039 Hypothyroidism, unspecified: Secondary | ICD-10-CM | POA: Diagnosis not present

## 2024-02-23 DIAGNOSIS — I083 Combined rheumatic disorders of mitral, aortic and tricuspid valves: Secondary | ICD-10-CM | POA: Diagnosis not present

## 2024-02-23 DIAGNOSIS — I5043 Acute on chronic combined systolic (congestive) and diastolic (congestive) heart failure: Secondary | ICD-10-CM | POA: Diagnosis not present

## 2024-02-23 HISTORY — DX: Presence of automatic (implantable) cardiac defibrillator: Z95.810

## 2024-02-23 LAB — SURGICAL PCR SCREEN
MRSA, PCR: NEGATIVE
Staphylococcus aureus: NEGATIVE

## 2024-02-23 LAB — CBC
HCT: 39.9 % (ref 39.0–52.0)
Hemoglobin: 12.8 g/dL — ABNORMAL LOW (ref 13.0–17.0)
MCH: 31.2 pg (ref 26.0–34.0)
MCHC: 32.1 g/dL (ref 30.0–36.0)
MCV: 97.3 fL (ref 80.0–100.0)
Platelets: 253 10*3/uL (ref 150–400)
RBC: 4.1 MIL/uL — ABNORMAL LOW (ref 4.22–5.81)
RDW: 15.4 % (ref 11.5–15.5)
WBC: 8.5 10*3/uL (ref 4.0–10.5)
nRBC: 0 % (ref 0.0–0.2)

## 2024-02-23 LAB — COMPREHENSIVE METABOLIC PANEL
ALT: 10 U/L (ref 0–44)
AST: 25 U/L (ref 15–41)
Albumin: 3.4 g/dL — ABNORMAL LOW (ref 3.5–5.0)
Alkaline Phosphatase: 48 U/L (ref 38–126)
Anion gap: 9 (ref 5–15)
BUN: 15 mg/dL (ref 8–23)
CO2: 17 mmol/L — ABNORMAL LOW (ref 22–32)
Calcium: 8.8 mg/dL — ABNORMAL LOW (ref 8.9–10.3)
Chloride: 109 mmol/L (ref 98–111)
Creatinine, Ser: 1.27 mg/dL — ABNORMAL HIGH (ref 0.61–1.24)
GFR, Estimated: 56 mL/min — ABNORMAL LOW (ref >60)
Glucose, Bld: 118 mg/dL — ABNORMAL HIGH (ref 70–99)
Potassium: 4.7 mmol/L (ref 3.5–5.1)
Sodium: 135 mmol/L (ref 135–145)
Total Bilirubin: 1 mg/dL (ref 0.0–1.2)
Total Protein: 6.7 g/dL (ref 6.5–8.1)

## 2024-02-23 LAB — TYPE AND SCREEN
ABO/RH(D): A POS
Antibody Screen: NEGATIVE

## 2024-02-23 LAB — APTT: aPTT: 27 s (ref 24–36)

## 2024-02-23 SURGERY — INSERTION, CAROTID SINUS BAROREFLEX ACTIVATION DEVICE
Anesthesia: General | Site: Chest | Laterality: Right

## 2024-02-23 MED ORDER — LABETALOL HCL 5 MG/ML IV SOLN: 10.0000 mg | INTRAVENOUS | Status: DC | PRN

## 2024-02-23 MED ORDER — PANTOPRAZOLE SODIUM 40 MG PO TBEC
40.0000 mg | DELAYED_RELEASE_TABLET | Freq: Every day | ORAL | Status: DC
  Administered 2024-02-24: 40 mg via ORAL
  Filled 2024-02-23: qty 1

## 2024-02-23 MED ORDER — SENNOSIDES-DOCUSATE SODIUM 8.6-50 MG PO TABS: 1.0000 | ORAL_TABLET | Freq: Every evening | ORAL | Status: DC | PRN

## 2024-02-23 MED ORDER — PROPOFOL 10 MG/ML IV BOLUS
INTRAVENOUS | Status: AC
  Filled 2024-02-23: qty 20

## 2024-02-23 MED ORDER — ACETAMINOPHEN 160 MG/5ML PO SOLN: 325.0000 mg | Freq: Once | ORAL | Status: DC | PRN

## 2024-02-23 MED ORDER — SACUBITRIL-VALSARTAN 24-26 MG PO TABS
1.0000 | ORAL_TABLET | Freq: Two times a day (BID) | ORAL | Status: DC
  Administered 2024-02-23 – 2024-02-24 (×3): 1 via ORAL
  Filled 2024-02-23 (×3): qty 1

## 2024-02-23 MED ORDER — LACTATED RINGERS IV SOLN: INTRAVENOUS | Status: DC

## 2024-02-23 MED ORDER — METHOCARBAMOL 500 MG PO TABS: 500.0000 mg | ORAL_TABLET | Freq: Every day | ORAL | Status: DC | PRN

## 2024-02-23 MED ORDER — ONDANSETRON HCL 4 MG/2ML IJ SOLN
INTRAMUSCULAR | Status: DC | PRN
  Administered 2024-02-23: 4 mg via INTRAVENOUS

## 2024-02-23 MED ORDER — ACETAMINOPHEN 325 MG PO TABS: 325.0000 mg | ORAL_TABLET | Freq: Once | ORAL | Status: DC | PRN

## 2024-02-23 MED ORDER — PHENOL 1.4 % MT LIQD: 1.0000 | OROMUCOSAL | Status: DC | PRN

## 2024-02-23 MED ORDER — TORSEMIDE 20 MG PO TABS
40.0000 mg | ORAL_TABLET | Freq: Every day | ORAL | Status: DC
  Administered 2024-02-23: 40 mg via ORAL
  Filled 2024-02-23 (×2): qty 2

## 2024-02-23 MED ORDER — POTASSIUM CHLORIDE CRYS ER 10 MEQ PO TBCR
10.0000 meq | EXTENDED_RELEASE_TABLET | Freq: Every day | ORAL | Status: DC
  Administered 2024-02-23 – 2024-02-24 (×2): 10 meq via ORAL
  Filled 2024-02-23 (×2): qty 1

## 2024-02-23 MED ORDER — ROCURONIUM BROMIDE 10 MG/ML (PF) SYRINGE
PREFILLED_SYRINGE | INTRAVENOUS | Status: DC | PRN
  Administered 2024-02-23: 50 mg via INTRAVENOUS

## 2024-02-23 MED ORDER — ALUM & MAG HYDROXIDE-SIMETH 200-200-20 MG/5ML PO SUSP: 15.0000 mL | ORAL | Status: DC | PRN

## 2024-02-23 MED ORDER — MECLIZINE HCL 25 MG PO TABS: 25.0000 mg | ORAL_TABLET | Freq: Three times a day (TID) | ORAL | Status: DC | PRN

## 2024-02-23 MED ORDER — PANTOPRAZOLE SODIUM 40 MG PO TBEC: 40.0000 mg | DELAYED_RELEASE_TABLET | Freq: Every day | ORAL | Status: DC

## 2024-02-23 MED ORDER — ONDANSETRON HCL 4 MG/2ML IJ SOLN
4.0000 mg | Freq: Four times a day (QID) | INTRAMUSCULAR | Status: DC | PRN
  Administered 2024-02-23: 4 mg via INTRAVENOUS
  Filled 2024-02-23: qty 2

## 2024-02-23 MED ORDER — 0.9 % SODIUM CHLORIDE (POUR BTL) OPTIME
TOPICAL | Status: DC | PRN
  Administered 2024-02-23: 1000 mL

## 2024-02-23 MED ORDER — ACETAMINOPHEN 650 MG RE SUPP: 325.0000 mg | RECTAL | Status: DC | PRN

## 2024-02-23 MED ORDER — NITROGLYCERIN 0.4 MG SL SUBL: 0.4000 mg | SUBLINGUAL_TABLET | SUBLINGUAL | Status: DC | PRN

## 2024-02-23 MED ORDER — ACETAMINOPHEN 325 MG PO TABS
325.0000 mg | ORAL_TABLET | ORAL | Status: DC | PRN
  Administered 2024-02-23: 325 mg via ORAL

## 2024-02-23 MED ORDER — DOCUSATE SODIUM 100 MG PO CAPS
100.0000 mg | ORAL_CAPSULE | Freq: Every day | ORAL | Status: DC
  Filled 2024-02-23: qty 1

## 2024-02-23 MED ORDER — PROPOFOL 10 MG/ML IV BOLUS
INTRAVENOUS | Status: DC | PRN
  Administered 2024-02-23: 100 mg via INTRAVENOUS

## 2024-02-23 MED ORDER — SUGAMMADEX SODIUM 200 MG/2ML IV SOLN
INTRAVENOUS | Status: DC | PRN
  Administered 2024-02-23: 200 mg via INTRAVENOUS

## 2024-02-23 MED ORDER — LIDOCAINE 2% (20 MG/ML) 5 ML SYRINGE
INTRAMUSCULAR | Status: DC | PRN
  Administered 2024-02-23: 100 mg via INTRAVENOUS

## 2024-02-23 MED ORDER — CHLORHEXIDINE GLUCONATE CLOTH 2 % EX PADS: 6.0000 | MEDICATED_PAD | Freq: Once | CUTANEOUS | Status: DC

## 2024-02-23 MED ORDER — CEFAZOLIN SODIUM-DEXTROSE 2-4 GM/100ML-% IV SOLN
2.0000 g | Freq: Three times a day (TID) | INTRAVENOUS | Status: AC
  Administered 2024-02-23 – 2024-02-24 (×2): 2 g via INTRAVENOUS
  Filled 2024-02-23 (×2): qty 100

## 2024-02-23 MED ORDER — POLYETHYLENE GLYCOL 3350 17 GM/SCOOP PO POWD
17.0000 g | Freq: Every day | ORAL | Status: DC
Start: 2024-02-23 — End: 2024-02-24
  Filled 2024-02-23: qty 119

## 2024-02-23 MED ORDER — ALPRAZOLAM 0.5 MG PO TABS: 0.5000 mg | ORAL_TABLET | Freq: Three times a day (TID) | ORAL | Status: DC | PRN

## 2024-02-23 MED ORDER — AMIODARONE HCL 200 MG PO TABS
200.0000 mg | ORAL_TABLET | Freq: Every day | ORAL | Status: DC
  Administered 2024-02-24: 200 mg via ORAL
  Filled 2024-02-23: qty 1

## 2024-02-23 MED ORDER — SODIUM CHLORIDE 0.9 % IV SOLN: 500.0000 mL | Freq: Once | INTRAVENOUS | Status: DC | PRN

## 2024-02-23 MED ORDER — GUAIFENESIN-DM 100-10 MG/5ML PO SYRP: 15.0000 mL | ORAL_SOLUTION | ORAL | Status: DC | PRN

## 2024-02-23 MED ORDER — FENTANYL CITRATE (PF) 250 MCG/5ML IJ SOLN
INTRAMUSCULAR | Status: AC
  Filled 2024-02-23: qty 5

## 2024-02-23 MED ORDER — MAGNESIUM SULFATE 2 GM/50ML IV SOLN: 2.0000 g | Freq: Every day | INTRAVENOUS | Status: DC | PRN

## 2024-02-23 MED ORDER — POTASSIUM CHLORIDE CRYS ER 20 MEQ PO TBCR: 20.0000 meq | EXTENDED_RELEASE_TABLET | Freq: Every day | ORAL | Status: DC | PRN

## 2024-02-23 MED ORDER — BISACODYL 5 MG PO TBEC
10.0000 mg | DELAYED_RELEASE_TABLET | Freq: Every day | ORAL | Status: DC
  Filled 2024-02-23: qty 2

## 2024-02-23 MED ORDER — HYDROMORPHONE HCL 1 MG/ML IJ SOLN: 0.5000 mg | INTRAMUSCULAR | Status: DC | PRN

## 2024-02-23 MED ORDER — AMISULPRIDE (ANTIEMETIC) 5 MG/2ML IV SOLN
INTRAVENOUS | Status: AC
  Filled 2024-02-23: qty 4

## 2024-02-23 MED ORDER — COLCHICINE 0.6 MG PO TABS
0.6000 mg | ORAL_TABLET | Freq: Two times a day (BID) | ORAL | Status: DC
  Administered 2024-02-23 – 2024-02-24 (×2): 0.6 mg via ORAL
  Filled 2024-02-23 (×2): qty 1

## 2024-02-23 MED ORDER — ORAL CARE MOUTH RINSE: 15.0000 mL | Freq: Once | OROMUCOSAL | Status: AC

## 2024-02-23 MED ORDER — FENTANYL CITRATE (PF) 100 MCG/2ML IJ SOLN
25.0000 ug | INTRAMUSCULAR | Status: DC | PRN
  Administered 2024-02-23 (×2): 50 ug via INTRAVENOUS

## 2024-02-23 MED ORDER — LEVOTHYROXINE SODIUM 50 MCG PO TABS
50.0000 ug | ORAL_TABLET | Freq: Every day | ORAL | Status: DC
  Administered 2024-02-24: 50 ug via ORAL
  Filled 2024-02-23: qty 1

## 2024-02-23 MED ORDER — SODIUM CHLORIDE 0.9 % IV SOLN: INTRAVENOUS | Status: DC | PRN

## 2024-02-23 MED ORDER — OXYCODONE HCL 5 MG PO TABS: 5.0000 mg | ORAL_TABLET | ORAL | Status: DC | PRN

## 2024-02-23 MED ORDER — FENTANYL CITRATE (PF) 100 MCG/2ML IJ SOLN
INTRAMUSCULAR | Status: AC
  Filled 2024-02-23: qty 2

## 2024-02-23 MED ORDER — DEXAMETHASONE SODIUM PHOSPHATE 10 MG/ML IJ SOLN
INTRAMUSCULAR | Status: DC | PRN
  Administered 2024-02-23: 10 mg via INTRAVENOUS

## 2024-02-23 MED ORDER — TOBRAMYCIN 0.3 % OP SOLN: 1.0000 [drp] | Freq: Every day | OPHTHALMIC | Status: DC | PRN

## 2024-02-23 MED ORDER — BISACODYL 5 MG PO TBEC: 5.0000 mg | DELAYED_RELEASE_TABLET | Freq: Every day | ORAL | Status: DC | PRN

## 2024-02-23 MED ORDER — SODIUM CHLORIDE 0.9 % IV SOLN: INTRAVENOUS | Status: DC

## 2024-02-23 MED ORDER — ACETAMINOPHEN 10 MG/ML IV SOLN
1000.0000 mg | Freq: Once | INTRAVENOUS | Status: DC | PRN
  Administered 2024-02-23: 1000 mg via INTRAVENOUS

## 2024-02-23 MED ORDER — PHENYLEPHRINE HCL-NACL 20-0.9 MG/250ML-% IV SOLN
INTRAVENOUS | Status: DC | PRN
  Administered 2024-02-23: 30 ug/min via INTRAVENOUS

## 2024-02-23 MED ORDER — EPHEDRINE SULFATE-NACL 50-0.9 MG/10ML-% IV SOSY
PREFILLED_SYRINGE | INTRAVENOUS | Status: DC | PRN
  Administered 2024-02-23: 10 mg via INTRAVENOUS
  Administered 2024-02-23: 5 mg via INTRAVENOUS
  Administered 2024-02-23 (×2): 10 mg via INTRAVENOUS

## 2024-02-23 MED ORDER — DIGOXIN 125 MCG PO TABS: 0.0625 mg | ORAL_TABLET | ORAL | Status: DC

## 2024-02-23 MED ORDER — CHLORHEXIDINE GLUCONATE 0.12 % MT SOLN
15.0000 mL | Freq: Once | OROMUCOSAL | Status: AC
  Administered 2024-02-23: 15 mL via OROMUCOSAL
  Filled 2024-02-23: qty 15

## 2024-02-23 MED ORDER — LACTATED RINGERS IV SOLN: INTRAVENOUS | Status: DC | PRN

## 2024-02-23 MED ORDER — LOSARTAN POTASSIUM 25 MG PO TABS
12.5000 mg | ORAL_TABLET | Freq: Every day | ORAL | Status: DC
Start: 2024-02-23 — End: 2024-02-23

## 2024-02-23 MED ORDER — PHENYLEPHRINE 80 MCG/ML (10ML) SYRINGE FOR IV PUSH (FOR BLOOD PRESSURE SUPPORT)
PREFILLED_SYRINGE | INTRAVENOUS | Status: DC | PRN
  Administered 2024-02-23: 160 ug via INTRAVENOUS

## 2024-02-23 MED ORDER — ACETAMINOPHEN 325 MG PO TABS
650.0000 mg | ORAL_TABLET | Freq: Four times a day (QID) | ORAL | Status: DC | PRN
  Filled 2024-02-23: qty 2

## 2024-02-23 MED ORDER — HEPARIN SODIUM (PORCINE) 5000 UNIT/ML IJ SOLN
5000.0000 [IU] | Freq: Three times a day (TID) | INTRAMUSCULAR | Status: DC
Start: 2024-02-24 — End: 2024-02-24
  Administered 2024-02-24: 5000 [IU] via SUBCUTANEOUS
  Filled 2024-02-23: qty 1

## 2024-02-23 MED ORDER — FENTANYL CITRATE (PF) 250 MCG/5ML IJ SOLN
INTRAMUSCULAR | Status: DC | PRN
Start: 2024-02-23 — End: 2024-02-23
  Administered 2024-02-23: 50 ug via INTRAVENOUS
  Administered 2024-02-23: 100 ug via INTRAVENOUS

## 2024-02-23 MED ORDER — BISOPROLOL FUMARATE 5 MG PO TABS
5.0000 mg | ORAL_TABLET | Freq: Every day | ORAL | Status: DC
  Administered 2024-02-24: 5 mg via ORAL
  Filled 2024-02-23: qty 1

## 2024-02-23 MED ORDER — CEFAZOLIN SODIUM-DEXTROSE 2-4 GM/100ML-% IV SOLN
2.0000 g | INTRAVENOUS | Status: AC
  Administered 2024-02-23: 2 g via INTRAVENOUS
  Filled 2024-02-23: qty 100

## 2024-02-23 MED ORDER — AMISULPRIDE (ANTIEMETIC) 5 MG/2ML IV SOLN
10.0000 mg | Freq: Once | INTRAVENOUS | Status: AC
  Administered 2024-02-23: 10 mg via INTRAVENOUS

## 2024-02-23 MED ORDER — METOPROLOL TARTRATE 5 MG/5ML IV SOLN: 2.0000 mg | INTRAVENOUS | Status: DC | PRN

## 2024-02-23 MED ORDER — ACETAMINOPHEN 10 MG/ML IV SOLN
INTRAVENOUS | Status: AC
  Filled 2024-02-23: qty 100

## 2024-02-23 MED ORDER — RANOLAZINE ER 500 MG PO TB12
500.0000 mg | ORAL_TABLET | Freq: Every day | ORAL | Status: DC
Start: 2024-02-23 — End: 2024-02-24
  Administered 2024-02-23 – 2024-02-24 (×2): 500 mg via ORAL
  Filled 2024-02-23 (×2): qty 1

## 2024-02-23 MED ORDER — HYDRALAZINE HCL 20 MG/ML IJ SOLN: 5.0000 mg | INTRAMUSCULAR | Status: DC | PRN

## 2024-02-23 MED ORDER — VASOPRESSIN 20 UNIT/ML IV SOLN
INTRAVENOUS | Status: AC
  Filled 2024-02-23: qty 1

## 2024-02-23 MED ORDER — ALLOPURINOL 100 MG PO TABS
100.0000 mg | ORAL_TABLET | Freq: Every day | ORAL | Status: DC
  Administered 2024-02-23: 100 mg via ORAL
  Filled 2024-02-23: qty 1

## 2024-02-23 MED ORDER — VASOPRESSIN 20 UNIT/ML IV SOLN
INTRAVENOUS | Status: DC | PRN
  Administered 2024-02-23: 1 [IU] via INTRAVENOUS
  Administered 2024-02-23 (×2): .5 [IU] via INTRAVENOUS

## 2024-02-23 SURGICAL SUPPLY — 34 items
BAG COUNTER SPONGE SURGICOUNT (BAG) ×1 IMPLANT
CANISTER SUCT 3000ML PPV (MISCELLANEOUS) ×1 IMPLANT
CHLORAPREP W/TINT 26 (MISCELLANEOUS) ×2 IMPLANT
CLIP TI MEDIUM 6 (CLIP) IMPLANT
CLIP TI WIDE RED SMALL 6 (CLIP) IMPLANT
COVER PROBE W GEL 5X96 (DRAPES) ×1 IMPLANT
DERMABOND ADVANCED .7 DNX12 (GAUZE/BANDAGES/DRESSINGS) ×1 IMPLANT
ELECT REM PT RETURN 9FT ADLT (ELECTROSURGICAL) ×1 IMPLANT
ELECTRODE REM PT RTRN 9FT ADLT (ELECTROSURGICAL) ×1 IMPLANT
GENERATOR IPG BAROSTIM 2104 (Generator) IMPLANT
GLOVE SURG SS PI 7.5 STRL IVOR (GLOVE) ×2 IMPLANT
GOWN STRL REUS W/ TWL LRG LVL3 (GOWN DISPOSABLE) ×2 IMPLANT
GOWN STRL REUS W/ TWL XL LVL3 (GOWN DISPOSABLE) ×2 IMPLANT
HEMOSTAT SNOW SURGICEL 2X4 (HEMOSTASIS) IMPLANT
KIT BASIN OR (CUSTOM PROCEDURE TRAY) ×1 IMPLANT
KIT TURNOVER KIT B (KITS) ×1 IMPLANT
LEAD CAROTID BAROSTIM 1036 (Lead) IMPLANT
MARKER SKIN DUAL TIP RULER LAB (MISCELLANEOUS) ×2 IMPLANT
NDL 18GX1X1/2 (RX/OR ONLY) (NEEDLE) ×2 IMPLANT
NEEDLE 18GX1X1/2 (RX/OR ONLY) (NEEDLE) ×1 IMPLANT
NS IRRIG 1000ML POUR BTL (IV SOLUTION) ×2 IMPLANT
PACK CAROTID (CUSTOM PROCEDURE TRAY) ×2 IMPLANT
PAD ARMBOARD 7.5X6 YLW CONV (MISCELLANEOUS) ×2 IMPLANT
POSITIONER HEAD DONUT 9IN (MISCELLANEOUS) ×1 IMPLANT
SUT ETHIBOND CT1 BRD #0 30IN (SUTURE) ×2 IMPLANT
SUT ETHILON 3 0 PS 1 (SUTURE) IMPLANT
SUT PROLENE 6 0 BV (SUTURE) ×16 IMPLANT
SUT SILK 0 FSL (SUTURE) IMPLANT
SUT VIC AB 3-0 SH 27X BRD (SUTURE) ×2 IMPLANT
SUT VICRYL 4-0 PS2 18IN ABS (SUTURE) ×4 IMPLANT
SYR 5ML LL (SYRINGE) ×2 IMPLANT
SYR BULB IRRIG 60ML STRL (SYRINGE) ×2 IMPLANT
TOWEL GREEN STERILE (TOWEL DISPOSABLE) ×2 IMPLANT
WATER STERILE IRR 1000ML POUR (IV SOLUTION) ×1 IMPLANT

## 2024-02-23 NOTE — Progress Notes (Signed)
 Pt arrived to rm 22 from PACU. Initiated tele. Oriented pt to the unit. VSS. Call bell within reach.   Lawson Radar, RN

## 2024-02-23 NOTE — Care Management CC44 (Signed)
 Condition Code 44 Documentation Completed  Patient Details  Name: Ryan [name] MRN: [id number] Date of Birth: 1941/06/07   Condition Code 44 given:  Yes Patient signature on Condition Code 44 notice:  Yes Documentation of 2 MD's agreement:  Yes Code 44 added to claim:  Yes    Darrold Span, RN 02/23/2024, 3:24 PM

## 2024-02-23 NOTE — Op Note (Addendum)
    Patient name: Ryan [name] MRN: [id number] DOB: [birth year] Sex: male  02/23/2024 Pre-operative Diagnosis: CHF, NYHA III Post-operative diagnosis:  Same Surgeon:  Durene Cal Assistants:  Richarda Osmond Procedure:   right Barostim implant Anesthesia:  General Blood Loss:  minimal Specimens:  none  Findings:  device at position "A"  Device: Serial #[id number], model 01/21/2003 Lead: Serial #[id number], model 1036  Indications:  This is a 83 year old gentleman with NYHA class III symptoms despite goal-directed medical therapy.  He comes in today for Barostim implant   Procedure:  The patient was identified in the holding area and taken to [facility] OR ROOM 16  The patient was then placed supine on the table. general anesthesia was administered.  The patient was prepped and draped in the usual sterile fashion.  A time out was called and antibiotics were administered.  An assistant was necessary to expedite the procedure.  She helped with exposure by providing suction and retraction.  She helped with the sutures by following.  She helped with wound closure.   Ultrasound was used to identify the carotid bifurcation in the right neck.  I began by making an incision along the anterior border of the sternocleidomastoid.  Cautery was used to divide subcutaneous tissue and platysma muscle.  I then identified the common facial vein which was ligated between silk ties.  I was then able to expose the carotid bifurcation.  I did visualize the hypoglossal nerve.  This was protected throughout.  Once I had adequate exposure, attention was turned towards the right chest.  An incision was made 1 fingerbreadth below the clavicle.  Cautery was used to divide the subcutaneous tissue down to the pectoral fascia.  I then created a pocket anterior to the fascia.  Next, a tonsil clamp was used to create a tunnel between the 2 incisions.  The lead was then brought through the tunnel and connected to the generator.  The  generator was then placed into the pocket and we began testing.  Impedance was satisfactory.  I tested this at position "a".  We had a good HR response and a delayed BP response.    I then proceeded to suture the electrode at position a using 6 Prolene sutures.  The strain relief loop was then sutured to the carotid adventitia with two 6-0 Prolenes.  The incisions were then irrigated with antibiotic solution.  I made sure that hemostasis was excellent.  I then secured the generator in the pocket with 2-0 Ethibond sutures.  The neck incision was closed by reapproximating the platysma muscle with 3-0 Vicryl and skin with 4-0 Vicryl.  The chest incision was closed by reapproximating the subcutaneous tissue with 3-0 Vicryl and skin with 4-0 Vicryl.  Dermabond was placed on the incisions.  The patient was successfully extubated and taken the recovery room in stable condition.  There were no complications.   Relevant operative details: The bifurcation was relatively high.  The hypoglossal nerve was visualized and protected.      Disposition: To PACU stable  V. Durene Cal, M.D., [facility] [place] Office: [phone] Pager:  [phone]

## 2024-02-23 NOTE — Progress Notes (Signed)
 Pt does self catheterization at home as needed. Unable to  urinate for 6.5 hours. Requested being in and out cath. Obtained verbal order for in and out cath. Got 300 ml of urine. Pt tolerated well.   Lawson Radar, RN

## 2024-02-23 NOTE — Anesthesia Postprocedure Evaluation (Signed)
 Anesthesia Post Note  Patient: Ryan [name]  Procedure(s) Performed: INSERTION OF RIGHT BAROSTIM (Right: Chest)     Patient location during evaluation: PACU Anesthesia Type: General Level of consciousness: awake and alert Pain management: pain level controlled Vital Signs Assessment: post-procedure vital signs reviewed and stable Respiratory status: spontaneous breathing, nonlabored ventilation, respiratory function stable and patient connected to nasal cannula oxygen Cardiovascular status: blood pressure returned to baseline and stable Postop Assessment: no apparent nausea or vomiting Anesthetic complications: no  There were no known notable events for this encounter.  Last Vitals:  Vitals:   02/23/24 1205 02/23/24 1300  BP: (!) 115/55 (!) 102/39  Pulse: 71 69  Resp: 20 18  Temp: (!) 36.3 C   SpO2: 92% 93%    Last Pain:  Vitals:   02/23/24 1205  TempSrc: Oral  PainSc: 0-No pain                 Shelton Silvas

## 2024-02-23 NOTE — Transfer of Care (Signed)
 Immediate Anesthesia Transfer of Care Note  Patient: Ryan [name]  Procedure(s) Performed: INSERTION OF RIGHT BAROSTIM (Right: Chest)  Patient Location: PACU  Anesthesia Type:General  Level of Consciousness: awake, alert , and oriented  Airway & Oxygen Therapy: Patient Spontanous Breathing and Patient connected to face mask oxygen  Post-op Assessment: Report given to RN and Post -op Vital signs reviewed and stable  Post vital signs: Reviewed and stable  Last Vitals:  Vitals Value Taken Time  BP 152/67 02/23/24 1015  Temp    Pulse 69 02/23/24 1018  Resp 8 02/23/24 1018  SpO2 97 % 02/23/24 1018  Vitals shown include unfiled device data.  Last Pain:  Vitals:   02/23/24 0728  PainSc: 0-No pain         Complications: No notable events documented.

## 2024-02-23 NOTE — H&P (Signed)
 [facility] [place]   Patient name: Ryan [name]        MRN: [id number]        DOB: 02-Apr-1941          Sex: male     REQUESTING PROVIDER:     Dr. Shirlee Latch     REASON FOR CONSULT:    Barostim evaluation   HISTORY OF PRESENT ILLNESS:    Ryan [name] is a 83 y.o. male, who is referred for evaluation of a Barostim device.  The patient suffers from ischemic cardiomyopathy, permanent atrial fibrillation.  He is status post CABG.  Echo shows an ejection fraction of 25-30%.  The patient is on goal-directed medical therapy and still has symptoms of shortness of breath and fatigue.   He is status post left carotid endarterectomy by Dr. Hart Rochester in 2017.  He is status post aortobifemoral bypass graft he is status post CABG.  He is on Eliquis for atrial fibrillation.  He has stage III chronic renal insufficiency.  He has a ICD in place.    PAST MEDICAL HISTORY          Past Medical History:  Diagnosis Date   Acquired thrombophilia (HCC) 11/01/2021   Arthritis     CAD (coronary artery disease)     Carotid artery occlusion     Cataract      Bil eyes/worse in left eye   CHF (congestive heart failure) (HCC)     Chronic back pain     COVID-19 10/31/2021   DVT (deep venous thrombosis) (HCC)     Dysrhythmia     Enlarged prostate      takes Rapaflo daily   GERD (gastroesophageal reflux disease)      occasional   History of colon polyps     History of gout      has colchicine prn   History of kidney stones     Hyperlipidemia      takes Crestor daily   Hypertension      takes Amlodipine daily   Hypothyroidism     Myocardial infarction [facility])     Peripheral vascular disease (HCC)     Prolonged QT interval 11/01/2021   Pulmonary emboli (HCC) 03/20/2015    elevated d-dimer, intermediate V/Q study, atypical chest pain and SOB. Start on Xarelto 20mg  BID for 3 month   Rapid atrial fibrillation (HCC)     Renal insufficiency     Shortness of breath dyspnea      Urinary frequency     Urinary urgency              FAMILY HISTORY         Family History  Problem Relation Age of Onset   Heart disease Father     Heart attack Father     Heart disease Sister     Hypertension Sister     Heart attack Sister     Hypertension Mother     Diabetes Son            SOCIAL HISTORY:    Social History         Socioeconomic History   Marital status: Married      Spouse name: Ryan [name]   Number of children: 2   Years of education: Not on file   Highest education level: 9th grade  Occupational History   Not on file  Tobacco Use   Smoking status: Former  Current packs/day: 0.00      Average packs/day: 1 pack/day for 25.0 years (25.0 ttl pk-yrs)      Types: Cigarettes      Start date: 02/04/1962      Quit date: 02/04/1987      Years since quitting: 36.9   Smokeless tobacco: Former      Types: Chew      Quit date: 07/20/2009   Tobacco comments:      quit 35+yrs ago  Vaping Use   Vaping status: Never Used  Substance and Sexual Activity   Alcohol use: No      Alcohol/week: 0.0 standard drinks of alcohol   Drug use: No   Sexual activity: Not Currently  Other Topics Concern   Not on file  Social History Narrative   Not on file    Social Drivers of Health        Financial Resource Strain: Low Risk  (08/28/2021)    Overall Financial Resource Strain (CARDIA)     Difficulty of Paying Living Expenses: Not hard at all  Food Insecurity: No Food Insecurity (07/08/2022)    Hunger Vital Sign     Worried About Running Out of Food in the Last Year: Never true     Ran Out of Food in the Last Year: Never true  Transportation Needs: No Transportation Needs (07/08/2022)    PRAPARE - Therapist, art (Medical): No     Lack of Transportation (Non-Medical): No  Physical Activity: Not on file  Stress: Not on file  Social Connections: Not on file  Intimate Partner Violence: Not At Risk (07/08/2022)    Humiliation,  Afraid, Rape, and Kick questionnaire     Fear of Current or Ex-Partner: No     Emotionally Abused: No     Physically Abused: No     Sexually Abused: No      ALLERGIES:      Allergies       Allergies  Allergen Reactions   Jardiance [Empagliflozin] Nausea Only and Other (See Comments)      Made patient very sick to the stomach.   Ezetimibe Other (See Comments)      Myalgias    Ace Inhibitors Cough      Other reaction(s): cough   Aspirin Other (See Comments)      skin rash/easy bruising   Atorvastatin Other (See Comments)      weakness, fatigue (severe)   Ezetimibe-Simvastatin Other (See Comments)      myalgias   Rosuvastatin Other (See Comments)      Myalgias/weakness   Codeine Nausea And Vomiting            Unithroid [Levothyroxine Sodium] Other (See Comments)      Caused blurry vision per pt        CURRENT MEDICATIONS:            Current Outpatient Medications  Medication Sig Dispense Refill   acetaminophen (TYLENOL) 325 MG tablet Take 2 tablets (650 mg total) by mouth every 6 (six) hours as needed for mild pain (or Fever >/= 101). (Patient taking differently: Take 325 mg by mouth every 6 (six) hours as needed for mild pain (pain score 1-3) (or Fever >/= 101).) 20 tablet 0   allopurinol (ZYLOPRIM) 100 MG tablet Take 100 mg by mouth daily as needed (gout).       ALPRAZolam (XANAX) 0.5 MG tablet Take 0.5 mg by mouth 3 (three) times daily as needed for  anxiety or sleep.       amiodarone (PACERONE) 200 MG tablet Take 1 tablet (200 mg total) by mouth daily. 90 tablet 3   apixaban (ELIQUIS) 5 MG TABS tablet Take 2.5 mg by mouth 2 (two) times daily.       bisacodyl (DULCOLAX) 5 MG EC tablet Take 5 mg by mouth daily.       bisoprolol (ZEBETA) 5 MG tablet Take 1 tablet (5 mg total) by mouth daily. 90 tablet 3   colchicine 0.6 MG tablet Take 1 tablet (0.6 mg total) by mouth 2 (two) times daily. 60 tablet 3   digoxin (LANOXIN) 0.125 MG tablet Take 0.5 tablets (0.0625 mg total)  by mouth daily. 30 tablet 11   Evolocumab (REPATHA SURECLICK) 140 MG/ML SOAJ INJECT 140 MILLIGRAMS INTO THE SKIN EVERY 14 DAYS 6 mL 1   losartan (COZAAR) 25 MG tablet TAKE 1/2 TABLET BY MOUTH DAILY 45 tablet 3   meclizine (ANTIVERT) 12.5 MG tablet Take 2 tablets (25 mg total) by mouth 3 (three) times daily as needed. 90 tablet 3   methocarbamol (ROBAXIN) 500 MG tablet Take 500 mg by mouth daily as needed for muscle spasms (back pain.).       nitroGLYCERIN (NITROSTAT) 0.4 MG SL tablet Place 1 tablet (0.4 mg total) under the tongue every 5 (five) minutes as needed for chest pain. 25 tablet 3   pantoprazole (PROTONIX) 40 MG tablet Take 1 tablet (40 mg total) by mouth daily. 30 tablet 11   polyethylene glycol powder (GLYCOLAX/MIRALAX) 17 GM/SCOOP powder Take 17 g by mouth at bedtime.       potassium chloride SA (KLOR-CON M) 10 MEQ tablet Take 1 tablet (10 mEq total) by mouth daily. 30 tablet 6   ranolazine (RANEXA) 500 MG 12 hr tablet Take 1 tablet (500 mg total) by mouth daily. (Patient taking differently: Take 250 mg by mouth 2 (two) times daily.) 45 tablet 3   tobramycin (TOBREX) 0.3 % ophthalmic solution Place 1 drop into the left eye daily as needed (Dry eyes).       torsemide (DEMADEX) 20 MG tablet TAKE 60 MG DAILY ALTERNATING 40 MG DAILY 150 tablet 3      No current facility-administered medications for this visit.        REVIEW OF SYSTEMS:    [X]  denotes positive finding, [ ]  denotes negative finding Cardiac   Comments:  Chest pain or chest pressure:      Shortness of breath upon exertion: x    Short of breath when lying flat:      Irregular heart rhythm:             Vascular      Pain in calf, thigh, or hip brought on by ambulation:      Pain in feet at night that wakes you up from your sleep:       Blood clot in your veins:      Leg swelling:              Pulmonary      Oxygen at home:      Productive cough:       Wheezing:              Neurologic      Sudden weakness in  arms or legs:       Sudden numbness in arms or legs:       Sudden onset of difficulty speaking or slurred speech:  Temporary loss of vision in one eye:       Problems with dizziness:              Gastrointestinal      Blood in stool:         Vomited blood:              Genitourinary      Burning when urinating:       Blood in urine:             Psychiatric      Major depression:              Hematologic      Bleeding problems:      Problems with blood clotting too easily:             Skin      Rashes or ulcers:             Constitutional      Fever or chills:        PHYSICAL EXAM:       Vitals:    01/10/24 1338  BP: (!) 150/81  Pulse: 61  Resp: 20  Temp: 98.2 F (36.8 C)  SpO2: 92%  Weight: 142 lb (64.4 kg)  Height: 5\' 6"  (1.676 m)      GENERAL: The patient is a well-nourished male, in no acute distress. The vital signs are documented above. CARDIAC: There is a regular rate and rhythm.  VASCULAR: Well-healed left carotid incision PULMONARY: Nonlabored respirations ABDOMEN: Soft and non-tender  MUSCULOSKELETAL: There are no major deformities or cyanosis. NEUROLOGIC: No focal weakness or paresthesias are detected. SKIN: There are no ulcers or rashes noted. PSYCHIATRIC: The patient has a normal affect.   STUDIES:    I have reviewed the following:   Right Carotid: Velocities in the right ICA are consistent with a 60-79%                 stenosis.                 Non-hemodynamically significant plaque <50% noted in the  CCA.                The ECA appears <50% stenosed.   Left Carotid: Velocities in the left ICA are consistent with a 1-39%  stenosis.               Non-hemodynamically significant plaque <50% noted in the  CCA.               The ECA appears <50% stenosed.   Vertebrals: Bilateral vertebral arteries demonstrate antegrade flow.              Right vertebral artery demonstrates high resistant flow.                Increased PSV within the  right ICA compared to prior exam  10/29/2021             (172 cm/s to 236 cm/s)    ASSESSMENT and PLAN    NYHE class III heart failure: The patient is on goal-directed medical therapy, however he is still having symptoms of shortness of breath and fatigue.  He is referred for evaluation of a Barostim implant.  He has right carotid stenosis.  Velocity profile is in the 60-79% category however based off of a CT scan that he had 2 years ago and his end-diastolic velocities today, I think that he is closer  to the 60% range.  He is asymptomatic.  I discussed that he would be a candidate for a right sided Barostim implant.  If he were to need carotid intervention in the future, this could be done as a TCAR.  I discussed the details of the procedure with the patient as well as risk and benefits and he is eager to proceed.  He will need to be off of his Eliquis prior to the procedure.     Charlena Cross, MD, FACS [facility] [phone] Pager [phone]

## 2024-02-23 NOTE — Anesthesia Procedure Notes (Signed)
 Arterial Line Insertion Performed by: Shelton Silvas, MD, Allyn Kenner, CRNA, CRNA  Preanesthetic checklist: patient identified, IV checked, site marked, risks and benefits discussed, surgical consent, monitors and equipment checked, pre-op evaluation, timeout performed and anesthesia consent Lidocaine 1% used for infiltration radial was placed Catheter size: 20 G Hand hygiene performed , maximum sterile barriers used  and Seldinger technique used Allen's test indicative of satisfactory collateral circulation Attempts: 1 Procedure performed without using ultrasound guided technique.

## 2024-02-23 NOTE — Care Management Obs Status (Signed)
 MEDICARE OBSERVATION STATUS NOTIFICATION   Patient Details  Name: Ryan [name] MRN: [id number] Date of Birth: 27-Mar-1941   Medicare Observation Status Notification Given:  Yes    Darrold Span, RN 02/23/2024, 3:23 PM

## 2024-02-23 NOTE — Anesthesia Procedure Notes (Signed)
 Procedure Name: Intubation Date/Time: 02/23/2024 8:52 AM  Performed by: Allyn Kenner, CRNAPre-anesthesia Checklist: Patient identified, Emergency Drugs available, Suction available and Patient being monitored Patient Re-evaluated:Patient Re-evaluated prior to induction Oxygen Delivery Method: Circle System Utilized Preoxygenation: Pre-oxygenation with 100% oxygen Induction Type: IV induction Ventilation: Mask ventilation without difficulty Laryngoscope Size: Mac and 3 Grade View: Grade I Tube type: Oral Number of attempts: 1 Airway Equipment and Method: Stylet and Oral airway Placement Confirmation: ETT inserted through vocal cords under direct vision, positive ETCO2 and breath sounds checked- equal and bilateral Secured at: 22 cm Tube secured with: Tape Dental Injury: Teeth and Oropharynx as per pre-operative assessment

## 2024-02-23 NOTE — Care Management CC44 (Deleted)
 Condition Code 44 Documentation Completed  Patient Details  Name: ELVERT CUMPTON MRN: [id number] Date of Birth: 1941/06/07   Condition Code 44 given:  Yes Patient signature on Condition Code 44 notice:  Yes Documentation of 2 MD's agreement:  Yes Code 44 added to claim:  Yes    Darrold Span, RN 02/23/2024, 3:24 PM

## 2024-02-24 DIAGNOSIS — I5042 Chronic combined systolic (congestive) and diastolic (congestive) heart failure: Secondary | ICD-10-CM | POA: Diagnosis not present

## 2024-02-24 LAB — CBC
HCT: 33.7 % — ABNORMAL LOW (ref 39.0–52.0)
Hemoglobin: 11.3 g/dL — ABNORMAL LOW (ref 13.0–17.0)
MCH: 31.1 pg (ref 26.0–34.0)
MCHC: 33.5 g/dL (ref 30.0–36.0)
MCV: 92.8 fL (ref 80.0–100.0)
Platelets: 255 10*3/uL (ref 150–400)
RBC: 3.63 MIL/uL — ABNORMAL LOW (ref 4.22–5.81)
RDW: 15.2 % (ref 11.5–15.5)
WBC: 15.8 10*3/uL — ABNORMAL HIGH (ref 4.0–10.5)
nRBC: 0 % (ref 0.0–0.2)

## 2024-02-24 LAB — BASIC METABOLIC PANEL
Anion gap: 6 (ref 5–15)
BUN: 18 mg/dL (ref 8–23)
CO2: 19 mmol/L — ABNORMAL LOW (ref 22–32)
Calcium: 7.9 mg/dL — ABNORMAL LOW (ref 8.9–10.3)
Chloride: 110 mmol/L (ref 98–111)
Creatinine, Ser: 1.35 mg/dL — ABNORMAL HIGH (ref 0.61–1.24)
GFR, Estimated: 52 mL/min — ABNORMAL LOW (ref >60)
Glucose, Bld: 136 mg/dL — ABNORMAL HIGH (ref 70–99)
Potassium: 4.4 mmol/L (ref 3.5–5.1)
Sodium: 135 mmol/L (ref 135–145)

## 2024-02-24 LAB — LIPID PANEL
Cholesterol: 105 mg/dL (ref 0–200)
HDL: 47 mg/dL (ref >40)
LDL Cholesterol: 44 mg/dL (ref 0–99)
Total CHOL/HDL Ratio: 2.2 ratio
Triglycerides: 68 mg/dL (ref <150)
VLDL: 14 mg/dL (ref 0–40)

## 2024-02-24 MED ORDER — OXYCODONE HCL 5 MG PO TABS: 5.0000 mg | ORAL_TABLET | Freq: Four times a day (QID) | ORAL | 0 refills | Status: AC | PRN

## 2024-02-24 NOTE — Plan of Care (Signed)
  Problem: Education: Goal: Knowledge of General Education information will improve Description: Including pain rating scale, medication(s)/side effects and non-pharmacologic comfort measures Outcome: Adequate for Discharge   Problem: Health Behavior/Discharge Planning: Goal: Ability to manage health-related needs will improve Outcome: Adequate for Discharge   Problem: Clinical Measurements: Goal: Ability to maintain clinical measurements within normal limits will improve Outcome: Adequate for Discharge Goal: Will remain free from infection Outcome: Adequate for Discharge Goal: Diagnostic test results will improve Outcome: Adequate for Discharge Goal: Respiratory complications will improve Outcome: Adequate for Discharge Goal: Cardiovascular complication will be avoided Outcome: Adequate for Discharge   Problem: Activity: Goal: Risk for activity intolerance will decrease Outcome: Adequate for Discharge   Problem: Nutrition: Goal: Adequate nutrition will be maintained Outcome: Adequate for Discharge   Problem: Coping: Goal: Level of anxiety will decrease Outcome: Adequate for Discharge   Problem: Elimination: Goal: Will not experience complications related to bowel motility Outcome: Adequate for Discharge Goal: Will not experience complications related to urinary retention Outcome: Adequate for Discharge   Problem: Pain Managment: Goal: General experience of comfort will improve and/or be controlled Outcome: Adequate for Discharge   Problem: Safety: Goal: Ability to remain free from injury will improve Outcome: Adequate for Discharge   Problem: Skin Integrity: Goal: Risk for impaired skin integrity will decrease Outcome: Adequate for Discharge   Problem: Education: Goal: Knowledge of discharge needs will improve Outcome: Adequate for Discharge   Problem: Clinical Measurements: Goal: Postoperative complications will be avoided or minimized Outcome: Adequate for  Discharge   Problem: Respiratory: Goal: Will achieve and/or maintain a regular respiratory rate, without signs or symptoms of dyspnea Outcome: Adequate for Discharge   Problem: Skin Integrity: Goal: Demonstration of wound healing without infection will improve Outcome: Adequate for Discharge   Problem: Education: Goal: Knowledge of disease or condition will improve Outcome: Adequate for Discharge Goal: Knowledge of secondary prevention will improve (MUST DOCUMENT ALL) Outcome: Adequate for Discharge Goal: Knowledge of patient specific risk factors will improve (DELETE if not current risk factor) Outcome: Adequate for Discharge   Problem: Ischemic Stroke/TIA Tissue Perfusion: Goal: Complications of ischemic stroke/TIA will be minimized Outcome: Adequate for Discharge

## 2024-02-24 NOTE — Discharge Summary (Signed)
 Discharge Summary    Ryan [name] [birth year] 83 y.o. male  [id number]  Admission Date: 02/23/2024  Discharge Date: 02/24/2024  Physician: Nada Libman, MD  Admission Diagnosis: Heart failure [facility]) [I50.9]   HPI:   This is a 83 y.o. male with NYHA class III symptoms despite goal-directed medical therapy.  He comes in today for [facility] implant   Hospital Course:  The patient was admitted to the hospital and taken to the operating room on 02/23/2024 and underwent: Right barostim implant    Findings:  device at position "A"   The bifurcation was relatively high.  The hypoglossal nerve was visualized and protected.   The pt tolerated the procedure well and was transported to the PACU in good condition.   By POD 1, pt doing well and is discharged home.  He is neurologically in tact and his incisions look fine.    CBC    Component Value Date/Time   WBC 15.8 (H) 02/24/2024 0500   RBC 3.63 (L) 02/24/2024 0500   HGB 11.3 (L) 02/24/2024 0500   HGB 11.6 (L) 11/11/2023 1201   HCT 33.7 (L) 02/24/2024 0500   HCT 36.7 (L) 11/11/2023 1201   PLT 255 02/24/2024 0500   PLT 297 11/11/2023 1201   MCV 92.8 02/24/2024 0500   MCV 98 (H) 11/11/2023 1201   MCH 31.1 02/24/2024 0500   MCHC 33.5 02/24/2024 0500   RDW 15.2 02/24/2024 0500   RDW 12.4 11/11/2023 1201   LYMPHSABS 1.4 11/11/2023 1201   MONOABS 0.9 12/28/2021 0423   EOSABS 0.1 11/11/2023 1201   BASOSABS 0.0 11/11/2023 1201    BMET    Component Value Date/Time   NA 135 02/24/2024 0500   NA 139 11/11/2023 1201   K 4.4 02/24/2024 0500   CL 110 02/24/2024 0500   CO2 19 (L) 02/24/2024 0500   GLUCOSE 136 (H) 02/24/2024 0500   BUN 18 02/24/2024 0500   BUN 27 11/11/2023 1201   CREATININE 1.35 (H) 02/24/2024 0500   CREATININE 1.04 12/26/2015 0812   CALCIUM 7.9 (L) 02/24/2024 0500   GFRNONAA 52 (L) 02/24/2024 0500   GFRAA 63 02/07/2021 0934      Discharge Instructions     Discharge patient   Complete by: As  directed    Discharge disposition: 01-Home or Self Care   Discharge patient date: 02/24/2024       Discharge Diagnosis:  Heart failure (HCC) [I50.9]  Secondary Diagnosis: Patient Active Problem List   Diagnosis Date Noted   Heart failure (HCC) 02/23/2024   Therapeutic drug monitoring 11/11/2023   Elevated TSH 01/12/2023   Acute respiratory failure (HCC) 04/23/2022   Acute pulmonary edema (HCC)    CAD (coronary artery disease) 04/14/2022   Unstable angina (HCC) 03/16/2022   HFrEF (heart failure with reduced ejection fraction) (HCC) 12/31/2021   Acute heart failure with preserved ejection fraction (HCC) 12/26/2021   Complete tear of right rotator cuff 11/21/2021   NSVT (nonsustained ventricular tachycardia) (HCC) 11/18/2021   Orthostatic hypotension 11/12/2021   Nausea 11/12/2021   Aortic stenosis 11/11/2021   Community acquired pneumonia 11/01/2021   Prolonged QT interval 11/01/2021   Acquired thrombophilia (HCC) 11/01/2021   Aortic atherosclerosis (HCC) 11/01/2021   Intractable nausea and vomiting 09/10/2021   Hypoxemia    ICD (implantable cardioverter-defibrillator) in place 03/22/2021   VT (ventricular tachycardia) (HCC) 12/28/2020   Chronic heart failure with preserved ejection fraction (HCC) 12/28/2020   NSTEMI (non-ST elevated myocardial infarction) (HCC)    Acute  respiratory failure with hypoxia (HCC)    S/P total hip arthroplasty 08/16/2019   Unilateral primary osteoarthritis, right hip 07/18/2019   Persistent atrial fibrillation (HCC) 01/27/2019   Vertigo 01/27/2019   History of pneumonia 01/27/2019   Stage 3a chronic kidney disease (CKD) (HCC) 01/27/2019   Chest pain 02/07/2018   Elevated troponin 03/23/2017   Sigmoid diverticulitis 03/23/2017   Wrist pain, acute, right    Acute on chronic systolic heart failure (HCC)    Hypothyroidism    Dyspnea 02/26/2017   Shoulder blade pain 02/26/2017   SOB (shortness of breath) 02/26/2017   Chronic right shoulder  pain    Chronic combined systolic and diastolic heart failure (HCC)    Hyperlipidemia LDL goal <70 01/20/2017   Chronic cholecystitis 07/27/2016   PAD (peripheral artery disease) (HCC) 07/09/2015   S/P CABG x 4 04/05/2015   Atypical chest pain 03/20/2015   Carotid artery disease (HCC) 10/09/2014   PVD (peripheral vascular disease) (HCC) 10/09/2014   HTN (hypertension) 05/30/2014   Arthritis of left hip 01/09/2014   Status post THR (total hip replacement) 01/09/2014   Spinal stenosis, lumbar 01/06/2013    Class: Diagnosis of   Coronary artery disease 01/05/2013   Atherosclerosis of native artery of extremity with intermittent claudication (HCC) 10/18/2012   Past Medical History:  Diagnosis Date   Acquired thrombophilia (HCC) 11/01/2021   AICD (automatic cardioverter/defibrillator) present    [employer]   Arthritis    CAD (coronary artery disease)    Carotid artery occlusion    Cataract    Bil eyes/worse in left eye   CHF (congestive heart failure) (HCC)    Chronic back pain    COVID-19 10/31/2021   DVT (deep venous thrombosis) (HCC)    Dysrhythmia    Enlarged prostate    takes Rapaflo daily   GERD (gastroesophageal reflux disease)    occasional   History of colon polyps    History of gout    has colchicine prn   History of kidney stones    Hyperlipidemia    takes Crestor daily   Hypertension    takes Amlodipine daily   Hypothyroidism    Myocardial infarction [facility])    Peripheral vascular disease (HCC)    Prolonged QT interval 11/01/2021   Pulmonary emboli (HCC) 03/20/2015   elevated d-dimer, intermediate V/Q study, atypical chest pain and SOB. Start on Xarelto 20mg  BID for 3 month   Rapid atrial fibrillation (HCC)    Renal insufficiency    Shortness of breath dyspnea    Urinary frequency    Urinary urgency      Allergies as of 02/24/2024       Reactions   Jardiance [empagliflozin] Nausea Only, Other (See Comments)   Made patient very sick to the  stomach.   Ezetimibe Other (See Comments)   Myalgias    Ace Inhibitors Cough   Other reaction(s): cough   Aspirin Other (See Comments)   skin rash/easy bruising   Atorvastatin Other (See Comments)   weakness, fatigue (severe)   Ezetimibe-simvastatin Other (See Comments)   myalgias   Rosuvastatin Other (See Comments)   Myalgias/weakness   Codeine Nausea And Vomiting       Unithroid [levothyroxine Sodium] Other (See Comments)   Caused blurry vision per pt        Medication List     TAKE these medications    acetaminophen 325 MG tablet Commonly known as: TYLENOL Take 2 tablets (650 mg total) by mouth every 6 (  six) hours as needed for mild pain (or Fever >/= 101).   allopurinol 100 MG tablet Commonly known as: ZYLOPRIM Take 100 mg by mouth at bedtime.   ALPRAZolam 0.5 MG tablet Commonly known as: XANAX Take 0.5 mg by mouth 3 (three) times daily as needed for anxiety or sleep.   amiodarone 200 MG tablet Commonly known as: PACERONE Take 1 tablet (200 mg total) by mouth daily.   apixaban 5 MG Tabs tablet Commonly known as: ELIQUIS Take 2.5 mg by mouth 2 (two) times daily.   bisacodyl 5 MG EC tablet Commonly known as: DULCOLAX Take 10 mg by mouth at bedtime.   bisoprolol 5 MG tablet Commonly known as: ZEBETA Take 1 tablet (5 mg total) by mouth daily.   colchicine 0.6 MG tablet Take 1 tablet (0.6 mg total) by mouth 2 (two) times daily.   digoxin 0.125 MG tablet Commonly known as: Lanoxin Take 0.5 tablets (0.0625 mg total) by mouth every other day.   Entresto 24-26 MG Generic drug: sacubitril-valsartan Take 1 tablet by mouth 2 (two) times daily.   levothyroxine 50 MCG tablet Commonly known as: Synthroid Take 1 tablet (50 mcg total) by mouth daily before breakfast.   losartan 25 MG tablet Commonly known as: COZAAR Take 12.5 mg by mouth daily.   meclizine 12.5 MG tablet Commonly known as: ANTIVERT Take 2 tablets (25 mg total) by mouth 3 (three) times  daily as needed.   methocarbamol 500 MG tablet Commonly known as: ROBAXIN Take 500 mg by mouth daily as needed for muscle spasms (back pain.).   nitroGLYCERIN 0.4 MG SL tablet Commonly known as: NITROSTAT Place 1 tablet (0.4 mg total) under the tongue every 5 (five) minutes as needed for chest pain.   oxyCODONE 5 MG immediate release tablet Commonly known as: Roxicodone Take 1 tablet (5 mg total) by mouth every 6 (six) hours as needed.   pantoprazole 40 MG tablet Commonly known as: PROTONIX Take 1 tablet (40 mg total) by mouth daily.   polyethylene glycol powder 17 GM/SCOOP powder Commonly known as: GLYCOLAX/MIRALAX Take 17 g by mouth at bedtime.   potassium chloride 10 MEQ tablet Commonly known as: KLOR-CON M Take 1 tablet (10 mEq total) by mouth daily.   ranolazine 500 MG 12 hr tablet Commonly known as: RANEXA Take 1 tablet (500 mg total) by mouth daily.   Repatha SureClick 140 MG/ML Soaj Generic drug: Evolocumab INJECT 140 MILLIGRAMS INTO THE SKIN EVERY 14 DAYS   tobramycin 0.3 % ophthalmic solution Commonly known as: TOBREX Place 1 drop into the left eye daily as needed (Dry eyes).   torsemide 20 MG tablet Commonly known as: DEMADEX Take 40 mg by mouth daily.        Prescriptions given: Roxicet #8 No Refill  Instructions: 1.  No driving for 72 hours and while taking pain medication 2.  No heavy lifting x 3 weeks 3.  Shower daily starting 02/25/2024  Disposition: home  Patient's condition: is Good  Follow up: 1. Device clinic   Doreatha Massed, [place] [facility] [phone] 02/24/2024  10:12 AM

## 2024-02-24 NOTE — Progress Notes (Signed)
 PHARMACIST LIPID MONITORING   Ryan [name] is a 83 y.o. male admitted on 02/23/2024 with Barostim evaluation.  Pharmacy has been consulted to optimize lipid-lowering therapy with the indication of secondary prevention for clinical ASCVD.  Recent Labs:  Lipid Panel (last 6 months):   Lab Results  Component Value Date   CHOL 105 02/24/2024   TRIG 68 02/24/2024   HDL 47 02/24/2024   CHOLHDL 2.2 02/24/2024   VLDL 14 02/24/2024   LDLCALC 44 02/24/2024    Hepatic function panel (last 6 months):   Lab Results  Component Value Date   AST 25 02/23/2024   ALT 10 02/23/2024   ALKPHOS 48 02/23/2024   BILITOT 1.0 02/23/2024    SCr (since admission):   Serum creatinine: 1.35 mg/dL (H) 81/19/14 7829 Estimated creatinine clearance: 36.3 mL/min (A)  Current therapy and lipid therapy tolerance Current lipid-lowering therapy: Repatha Previous lipid-lowering therapies (if applicable): na Documented or reported allergies or intolerances to lipid-lowering therapies (if applicable): Statins and Ezetimibe  Assessment:   Lipids close to goal range on Repatha therapy  Plan:    1.Statin intensity (high intensity recommended for all patients regardless of the LDL):  Statin intolerance noted. No statin changes due to serious side effects (ex. Myalgias with at least 2 different statins).  2.Add ezetimibe (if any one of the following):   Not indicated at this time.  3.Refer to lipid clinic:   No  4.Follow-up with:  Primary care provider - Cleatis Polka., MD  5.Follow-up labs after discharge:  No changes in lipid therapy, repeat a lipid panel in one year.       Jeanella Cara, PharmD, [facility] Clinical Pharmacist Please see AMION for all Pharmacists' Contact Phone Numbers 02/24/2024, 7:49 AM

## 2024-02-24 NOTE — Progress Notes (Signed)
    Subjective  - POD #1, s/p Barostim  Feels good this am.  Has ambulated   Physical Exam:  Incisions c/d/I        Assessment/Plan:  POD #1  Plan for d/c home Follow up in device clinic  Wells Qasim Diveley 02/24/2024 9:21 AM --  Vitals:   02/24/24 0510 02/24/24 0902  BP: 127/65 (!) 103/53  Pulse: 81 89  Resp: 20 20  Temp:  97.7 F (36.5 C)  SpO2: 97% 100%    Intake/Output Summary (Last 24 hours) at 02/24/2024 0921 Last data filed at 02/24/2024 0600 Gross per 24 hour  Intake 1350 ml  Output 1000 ml  Net 350 ml     Laboratory CBC    Component Value Date/Time   WBC 15.8 (H) 02/24/2024 0500   HGB 11.3 (L) 02/24/2024 0500   HGB 11.6 (L) 11/11/2023 1201   HCT 33.7 (L) 02/24/2024 0500   HCT 36.7 (L) 11/11/2023 1201   PLT 255 02/24/2024 0500   PLT 297 11/11/2023 1201    BMET    Component Value Date/Time   NA 135 02/24/2024 0500   NA 139 11/11/2023 1201   K 4.4 02/24/2024 0500   CL 110 02/24/2024 0500   CO2 19 (L) 02/24/2024 0500   GLUCOSE 136 (H) 02/24/2024 0500   BUN 18 02/24/2024 0500   BUN 27 11/11/2023 1201   CREATININE 1.35 (H) 02/24/2024 0500   CREATININE 1.04 12/26/2015 0812   CALCIUM 7.9 (L) 02/24/2024 0500   GFRNONAA 52 (L) 02/24/2024 0500   GFRAA 63 02/07/2021 0934    COAG Lab Results  Component Value Date   INR 1.2 11/17/2021   INR 1.2 10/27/2021   INR 1.4 (H) 03/22/2021   No results found for: "PTT"  Antibiotics Anti-infectives (From admission, onward)    Start     Dose/Rate Route Frequency Ordered Stop   02/23/24 1730  ceFAZolin (ANCEF) IVPB 2g/100 mL premix        2 g 200 mL/hr over 30 Minutes Intravenous Every 8 hours 02/23/24 1205 02/24/24 0130   02/23/24 0928  ceFAZolin 1 g / gentamicin 80 mg in NS 500 mL surgical irrigation  Status:  Discontinued          As needed 02/23/24 0929 02/23/24 1011   02/23/24 0633  ceFAZolin (ANCEF) IVPB 2g/100 mL premix        2 g 200 mL/hr over 30 Minutes Intravenous 30 min pre-op 02/23/24  0633 02/23/24 0845        V. Charlena Cross, M.D., [facility] [place] Office: [phone] Pager:  [phone]

## 2024-03-06 ENCOUNTER — Other Ambulatory Visit (HOSPITAL_COMMUNITY): Payer: PPO

## 2024-03-10 NOTE — Progress Notes (Signed)
  Cardiology Office Note:   Date:  03/14/2024  ID:  Ryan [name], DOB [birth year], MRN [id number]  Primary Cardiologist: Elder Negus, MD Electrophysiologist: Lewayne Bunting, MD   History of Present Illness:   Ryan [name] is a 83 y.o. male with h/o chronic systolic CHF, VT, persistent AF, and ICM, seen today for post hospital follow up.    S/p BAT implant 02/23/2024  Since discharge from hospital the patient reports doing very well. He states prior to the surgery he didn't even want to get out of the recliner. Had poor appetite.  He is now has the energy to get up and move around and is feeling overall much better. Appetite has improved. Site is well healed. .  he denies chest pain, palpitations, dyspnea, PND, orthopnea, nausea, vomiting, dizziness, syncope, edema, weight gain, or early satiety.   Feels great.  Didn't even want to get out of the recliner prior to surgery.   Review of systems complete and found to be negative unless listed in HPI.    EP Information / Studies Reviewed:    EKG is not ordered today. EKG from 02/15/24 reviewed which showed sinus brady at 55 bpm  Arrhythmia/Device History [employer] ICD 12/2020 for CHF  S/p Barostim (commercial) 02/23/2024   Barostim Interrogation- Performed personally and reviewed in detail today,  See scanned report  ICD/PPM interrogation - Not performed today. See last [employer] report    Physical Exam:   VS:  BP (!) 104/56   Pulse (!) 59   Ht 5\' 6"  (1.676 m)   Wt 135 lb 6.4 oz (61.4 kg)   SpO2 99%   BMI 21.85 kg/m    Wt Readings from Last 3 Encounters:  03/14/24 135 lb 6.4 oz (61.4 kg)  02/23/24 134 lb (60.8 kg)  02/15/24 138 lb 12.8 oz (63 kg)     GEN: Well nourished, well developed in no acute distress NECK: No JVD; No carotid bruits CARDIAC: Regular rate and rhythm, no murmurs, rubs, gallops RESPIRATORY:  Clear to auscultation without rales, wheezing or rhonchi  ABDOMEN: Soft, non-tender,  non-distended EXTREMITIES:  No edema; No deformity   ASSESSMENT AND PLAN:    Chronic systolic CHF s/p Chief Financial Officer and Barostim implantation NYHA II-III symptoms.   Device titrated from 1.0 millamp to 5.0 milliamp by increments of 0.4 with good blood pressure response and no stimulation.  Tested all the way up to 6.0 without stim. Device impedence stable. Pt goals are to get out into his yard this spring and to overall improve functional status.   Normal device function See scanned report. Will follow up in 3-4 weeks to continue titration with goal of 6-8 milliamps for chronic settings.   H/o VT Follow. Next remote 4/9  Disposition:   Follow up with EP APP as scheduled. (Only with me for Barostim titration)  Signed, Graciella Freer, PA-C

## 2024-03-14 ENCOUNTER — Encounter: Payer: Self-pay | Admitting: Student

## 2024-03-14 ENCOUNTER — Ambulatory Visit: Payer: PPO | Attending: Student | Admitting: Student

## 2024-03-14 VITALS — BP 104/56 | HR 59 | Ht 66.0 in | Wt 135.4 lb

## 2024-03-14 DIAGNOSIS — I5042 Chronic combined systolic (congestive) and diastolic (congestive) heart failure: Secondary | ICD-10-CM | POA: Diagnosis not present

## 2024-03-14 DIAGNOSIS — I472 Ventricular tachycardia, unspecified: Secondary | ICD-10-CM | POA: Diagnosis not present

## 2024-03-14 NOTE — Patient Instructions (Signed)
 Medication Instructions:  Your physician recommends that you continue on your current medications as directed. Please refer to the Current Medication list given to you today.  *If you need a refill on your cardiac medications before your next appointment, please call your pharmacy*  Lab Work: As scheduled If you have labs (blood work) drawn today and your tests are completely normal, you will receive your results only by: MyChart Message (if you have MyChart) OR A paper copy in the mail If you have any lab test that is abnormal or we need to change your treatment, we will call you to review the results.  Follow-Up: At [facility], you and your health needs are our priority.  As part of our continuing mission to provide you with exceptional heart care, we have created designated Provider Care Teams.  These Care Teams include your primary Cardiologist (physician) and Advanced Practice Providers (APPs -  Physician Assistants and Nurse Practitioners) who all work together to provide you with the care you need, when you need it.  We recommend signing up for the patient portal called "MyChart".  Sign up information is provided on this After Visit Summary.  MyChart is used to connect with patients for Virtual Visits (Telemedicine).  Patients are able to view lab/test results, encounter notes, upcoming appointments, etc.  Non-urgent messages can be sent to your provider as well.   To learn more about what you can do with MyChart, go to [email or web address].    Your next appointment:   As scheduled

## 2024-03-23 ENCOUNTER — Telehealth: Payer: Self-pay | Admitting: Cardiology

## 2024-03-23 DIAGNOSIS — I214 Non-ST elevation (NSTEMI) myocardial infarction: Secondary | ICD-10-CM

## 2024-03-23 DIAGNOSIS — E785 Hyperlipidemia, unspecified: Secondary | ICD-10-CM

## 2024-03-23 DIAGNOSIS — I25118 Atherosclerotic heart disease of native coronary artery with other forms of angina pectoris: Secondary | ICD-10-CM

## 2024-03-23 MED ORDER — REPATHA SURECLICK 140 MG/ML ~~LOC~~ SOAJ: 140.0000 mg | SUBCUTANEOUS | 0 refills | Status: AC

## 2024-03-23 NOTE — Telephone Encounter (Signed)
*  STAT* If patient is at the pharmacy, call can be transferred to refill team.   1. Which medications need to be refilled? (please list name of each medication and dose if known) Repatha   2. Would you like to learn more about the convenience, safety, & potential cost savings by using the [facility]?     3. Are you open to using the [facility] (Type [facility].    4. Which pharmacy/location (including street and city if local pharmacy) is medication to be sent to?[facility] [place]   5. Do they need a 30 day or 90 day supply? 2 injections-

## 2024-03-27 ENCOUNTER — Other Ambulatory Visit (HOSPITAL_COMMUNITY): Payer: Self-pay

## 2024-03-27 ENCOUNTER — Telehealth: Payer: Self-pay | Admitting: Cardiology

## 2024-03-27 ENCOUNTER — Telehealth: Payer: Self-pay | Admitting: Pharmacy Technician

## 2024-03-27 NOTE — Telephone Encounter (Signed)
 Spoke with patient's spouse Earley Abide (OK per [facility] and informed her prior auth for repatha has been submitted, currently pending.  Will forward to Dr. Damian Leavell nurse to follow-up when PA is approved.  Hilda verbalized understanding and expressed appreciation for call.

## 2024-03-27 NOTE — Telephone Encounter (Signed)
 Pt c/o medication issue:  1. Name of Medication: Evolocumab (REPATHA SURECLICK) 140 MG/ML SOAJ   2. How are you currently taking this medication (dosage and times per day)? Inject 140 mg into the skin every 14 (fourteen) days.   3. Are you having a reaction (difficulty breathing--STAT)? No  4. What is your medication issue? Patient's spouse is calling because they went to get the medication from the pharmacy today. Patient's wife stated the pharmacy is needing PA from the insurance before they can get the medication. Patient is currently out of medication. Patient's wife is requesting a call back with an update. Please advise.

## 2024-03-27 NOTE — Telephone Encounter (Signed)
 Pharmacy Patient Advocate Encounter   Received notification from Pt Calls Messages that prior authorization for repatha is required/requested.   Insurance verification completed.   The patient is insured through [facility] ADVANTAGE/RX ADVANCE .   Per test claim: PA required; PA submitted to above mentioned insurance via CoverMyMeds Key/confirmation #/[id number] Status is pending

## 2024-03-28 ENCOUNTER — Ambulatory Visit (HOSPITAL_COMMUNITY): Attending: Cardiology

## 2024-03-28 DIAGNOSIS — I5020 Unspecified systolic (congestive) heart failure: Secondary | ICD-10-CM | POA: Diagnosis not present

## 2024-03-28 DIAGNOSIS — I350 Nonrheumatic aortic (valve) stenosis: Secondary | ICD-10-CM | POA: Diagnosis not present

## 2024-03-28 LAB — ECHOCARDIOGRAM COMPLETE
AR max vel: 0.65 cm2
AV Area VTI: 0.75 cm2
AV Area mean vel: 0.63 cm2
AV Mean grad: 10 mmHg
AV Peak grad: 17.6 mmHg
Ao pk vel: 2.1 m/s
Area-P 1/2: 2.96 cm2
S' Lateral: 3 cm

## 2024-03-28 NOTE — Telephone Encounter (Signed)
 Pharmacy Patient Advocate Encounter  Received notification from [facility] ADVANTAGE/RX ADVANCE that Prior Authorization for repatha has been APPROVED from 03/27/24 to 09/26/24. Spoke to pharmacy to process.Copay is $117.50 for 90 days.    PA #/Case ID/Reference #: [id number]

## 2024-03-29 ENCOUNTER — Ambulatory Visit (INDEPENDENT_AMBULATORY_CARE_PROVIDER_SITE_OTHER): Payer: Medicare Other

## 2024-03-29 DIAGNOSIS — I472 Ventricular tachycardia, unspecified: Secondary | ICD-10-CM

## 2024-03-30 LAB — CUP PACEART REMOTE DEVICE CHECK
Battery Remaining Longevity: 150 mo
Battery Remaining Percentage: 100 %
Brady Statistic RV Percent Paced: 0 %
Date Time Interrogation Session: 20250409025900
HighPow Impedance: 56 Ohm
Implantable Lead Connection Status: 753985
Implantable Lead Implant Date: 20220110
Implantable Lead Location: 753860
Implantable Lead Model: 137
Implantable Lead Serial Number: 301176
Implantable Pulse Generator Implant Date: 20220110
Lead Channel Impedance Value: 506 Ohm
Lead Channel Setting Pacing Amplitude: 2.5 V
Lead Channel Setting Pacing Pulse Width: 0.4 ms
Lead Channel Setting Sensing Sensitivity: 0.5 mV
Pulse Gen Serial Number: 211464

## 2024-04-02 ENCOUNTER — Other Ambulatory Visit: Payer: Self-pay | Admitting: Cardiology

## 2024-04-02 DIAGNOSIS — R42 Dizziness and giddiness: Secondary | ICD-10-CM

## 2024-04-04 NOTE — Telephone Encounter (Signed)
 Dr. Filiberto Hug prescribed and is refilling this non cardiac RX. Does Dr. Filiberto Hug want to refill this? Please advise

## 2024-04-04 NOTE — Telephone Encounter (Signed)
 90 pillsX1 refill, then let PCP take over.  Thanks MJP

## 2024-04-10 NOTE — Progress Notes (Unsigned)
  Cardiology Office Note:   Date:  04/11/2024  ID:  Ryan [name], Ryan [name] 1941-12-17, MRN [id number]  Primary Cardiologist: Cody Das, MD Electrophysiologist: Manya Sells, MD   History of Present Illness:   Ryan [name] is a 83 y.o. male with h/o chronic systolic CHF, VT, persistent AF, and ICM, seen today for post hospital follow up.    S/p BAT implant 02/23/2024  At last visit, Device titrated from 1.0 millamp to 5.0 milliamp by increments of 0.4 with good blood pressure response and no stimulation.   Since last visit doing great. Feels "day and night" different. Was basically sat in recliner all the time prior to procedure. Now getting up and moving around without issue. Has mild dizziness towards the afternoon and some nausea as well.    from hospital the patient reports doing very well. He states prior to the surgery he didn't even want to get out of the recliner. Had poor appetite.  He is now has the energy to get up and move around and is feeling overall much better. Appetite has improved. Site is well healed. .  he denies chest pain, palpitations, dyspnea, PND, orthopnea, nausea, vomiting, dizziness, syncope, edema, weight gain, or early satiety.   Feels great.  Didn't even want to get out of the recliner prior to surgery.   Review of systems complete and found to be negative unless listed in HPI.    EP Information / Studies Reviewed:    EKG is not ordered today. EKG from 02/15/24 reviewed which showed sinus brady at 55 bpm  Arrhythmia/Device History [employer] ICD 12/2020 for CHF  S/p Barostim (commercial) 02/23/2024   Barostim Interrogation- Performed personally and reviewed in detail today,  See scanned report  ICD/PPM interrogation - Not performed today. See last [employer] report    Physical Exam:   VS:  BP 128/60   Pulse (!) 58   Ht 5\' 6"  (1.676 m)   Wt 140 lb (63.5 kg)   SpO2 99%   BMI 22.60 kg/m    Wt Readings from Last 3 Encounters:   04/11/24 140 lb (63.5 kg)  03/14/24 135 lb 6.4 oz (61.4 kg)  02/23/24 134 lb (60.8 kg)     GEN: Well nourished, well developed in no acute distress NECK: No JVD; No carotid bruits CARDIAC: Regular rate and rhythm, no murmurs, rubs, gallops RESPIRATORY:  Clear to auscultation without rales, wheezing or rhonchi  ABDOMEN: Soft, non-tender, non-distended EXTREMITIES:  No edema; No deformity    ASSESSMENT AND PLAN:    Chronic systolic CHF s/p Chief Financial Officer and Barostim implantation NYHA II-III symptoms.   Device titrated from 5.0 millamp to 6.4 milliamp by increments of 0.4 with good blood pressure response. Had stim at 7.4 ma that resolved with down-titration.    Device impedence stable. Pt goals are to continue to increase functional status Normal device function See scanned report  H/o VT Follow. Next remote 4/9 We discussed decreasing amiodarone  to 200 mg M-F.   CAD Denies s/s ischemia  Disposition:   Follow up with EP APP  3 months.   Signed, Tylene Galla, PA-C

## 2024-04-11 ENCOUNTER — Ambulatory Visit: Attending: Student | Admitting: Student

## 2024-04-11 ENCOUNTER — Encounter: Payer: Self-pay | Admitting: Student

## 2024-04-11 VITALS — BP 128/60 | HR 58 | Ht 66.0 in | Wt 140.0 lb

## 2024-04-11 DIAGNOSIS — I5022 Chronic systolic (congestive) heart failure: Secondary | ICD-10-CM | POA: Diagnosis not present

## 2024-04-11 DIAGNOSIS — I472 Ventricular tachycardia, unspecified: Secondary | ICD-10-CM

## 2024-04-11 DIAGNOSIS — I5042 Chronic combined systolic (congestive) and diastolic (congestive) heart failure: Secondary | ICD-10-CM | POA: Diagnosis not present

## 2024-04-11 DIAGNOSIS — I25118 Atherosclerotic heart disease of native coronary artery with other forms of angina pectoris: Secondary | ICD-10-CM

## 2024-04-11 NOTE — Patient Instructions (Signed)
 Medication Instructions:  Your physician recommends that you continue on your current medications as directed. Please refer to the Current Medication list given to you today.  *If you need a refill on your cardiac medications before your next appointment, please call your pharmacy*  Lab Work: None ordered If you have labs (blood work) drawn today and your tests are completely normal, you will receive your results only by: MyChart Message (if you have MyChart) OR A paper copy in the mail If you have any lab test that is abnormal or we need to change your treatment, we will call you to review the results.  Follow-Up: At [facility], you and your health needs are our priority.  As part of our continuing mission to provide you with exceptional heart care, our providers are all part of one team.  This team includes your primary Cardiologist (physician) and Advanced Practice Providers or APPs (Physician Assistants and Nurse Practitioners) who all work together to provide you with the care you need, when you need it.  Your next appointment:   As scheduled  We recommend signing up for the patient portal called "MyChart".  Sign up information is provided on this After Visit Summary.  MyChart is used to connect with patients for Virtual Visits (Telemedicine).  Patients are able to view lab/test results, encounter notes, upcoming appointments, etc.  Non-urgent messages can be sent to your provider as well.   To learn more about what you can do with MyChart, go to [email or web address].     1st Floor: - Lobby - Registration  - Pharmacy  - Lab - Cafe  2nd Floor: - PV Lab - Diagnostic Testing (echo, CT, nuclear med)  3rd Floor: - Vacant  4th Floor: - TCTS (cardiothoracic surgery) - AFib Clinic - Structural Heart Clinic - Vascular Surgery  - Vascular Ultrasound  5th Floor: - HeartCare Cardiology (general and EP) - Clinical Pharmacy for coumadin, hypertension, lipid,  weight-loss medications, and med management appointments    Valet parking services will be available as well.

## 2024-05-12 NOTE — Addendum Note (Signed)
 Addended by: Lott Rouleau A on: 05/12/2024 11:47 AM   Modules accepted: Orders

## 2024-05-12 NOTE — Progress Notes (Signed)
 Remote ICD transmission.

## 2024-05-24 ENCOUNTER — Telehealth: Payer: Self-pay | Admitting: Internal Medicine

## 2024-05-24 NOTE — Telephone Encounter (Signed)
 Spoke with wife.  She states that  "Thinks we turned it up too much.  I am too tired now, this started right after we turned it up.  I felt better before, now don't feel good, having pains now that come and go".    Patient wants to know if can get his device re-adjusted?   Forwarding to A. Tillery PA-C for review.

## 2024-05-24 NOTE — Telephone Encounter (Signed)
 Spoke to patient to advise Andy's, PA recommendations.   Patient reports his pain is not in the neck but in his chest. States pain has been intermittent since adjusting the Barostim device. Confirmed with Jonelle Neri the Barostim will not cause pain in the center of his chest.   Per Andy's recommendations, send note to gen cards to follow up on chest pain. No adjustments to be made to Barostim.   Most recent remote transmission on 05/21/24, no alerts triggered. Presenting rhythm VS 56 bpm.   Patient aware of plan and voiced understanding to call if any changes occur.

## 2024-05-24 NOTE — Telephone Encounter (Signed)
 Agree with the above recommendations. Known CAD s/p CABG with known stable angina. Soonest I could see him would be 6/10 as a double book, othewise 6/13 is okat with me.  Thanks MJP

## 2024-05-24 NOTE — Telephone Encounter (Signed)
 Wife Lona Rist) stated patient has an issue with his Barostim device.

## 2024-05-24 NOTE — Telephone Encounter (Signed)
 Called pt back- pt reports that his pain is in the middle of his chest comes and goes- after he takes Ranolazine  ;1/2 tab in the morning and 1/2 tablet at night. He also reports that his nose runs constantly after taking this medication.   He did use NTG 3 times this week. It did help with the chest pain  Made appt with Dr Filiberto Hug for 06/02/24. Given ER precautions. Informed that I will send this information to his provider and if they have any recommendations, then we will call and let you know. If not, then we will just see you on 06/02/24.   If you experience active Chest Pain, including tightness, pressure, jaw pain, radiating pain to shoulder/upper arm/back, Chest Pain unrelieved by 2-3 doses of Nitroglycerin , Shortness Of Breath, nausea, vomiting, and/or sweating- THEN CALL 911 AND GO TO THE NEAREST EMERGENCY ROOM  Verbalized understanding of all information.

## 2024-06-02 ENCOUNTER — Ambulatory Visit: Attending: Cardiology | Admitting: Cardiology

## 2024-06-02 ENCOUNTER — Encounter: Payer: Self-pay | Admitting: Cardiology

## 2024-06-02 VITALS — BP 138/70 | HR 53 | Ht 65.0 in | Wt 134.2 lb

## 2024-06-02 DIAGNOSIS — I25118 Atherosclerotic heart disease of native coronary artery with other forms of angina pectoris: Secondary | ICD-10-CM

## 2024-06-02 DIAGNOSIS — I5020 Unspecified systolic (congestive) heart failure: Secondary | ICD-10-CM

## 2024-06-02 DIAGNOSIS — I350 Nonrheumatic aortic (valve) stenosis: Secondary | ICD-10-CM

## 2024-06-02 MED ORDER — ISOSORBIDE MONONITRATE ER 30 MG PO TB24
30.0000 mg | ORAL_TABLET | Freq: Every day | ORAL | 3 refills | Status: DC
Start: 2024-06-02 — End: 2024-10-22

## 2024-06-02 NOTE — Progress Notes (Signed)
 Cardiology Office Note:  .   Date:  06/02/2024  ID:  Ryan [name], DOB 03-25-1941, MRN [id number] PCP: Jeannine Milroy., MD  [facility] Providers Cardiologist:  Fransico Ivy, MD PCP: Jeannine Milroy., MD  Chief Complaint  Patient presents with   Chest Pain      History of Present Illness: .    Ryan [name] is a 83 y.o. male with CAD s/p CABG (patent LIMA-LAD, occluded SVG-ramus/OM Y graft, occluded SVG-PDA), ischemic cardiomyopathy, s/p ICD, s/p barostim implantation, NSVT, aortic stenosis, persistent Afib, PAD s/p aortobifemoral bypass, mod carotid disease, h/o stroke, CKD III   Patient had experienced episodes of chest pain few days ago, refused nitroglycerin .  However, they have completely resolved at this time.  He is otherwise feeling well, denies any exertional dyspnea, leg edema.  He continues to have low appetite, but has had improved energy levels since stent plantation.  Separately, patient is currently taking Ranexa  500 mg only once daily, but complains of runny nose soon after taking it every day.  He requests if he could come off Ranexa .  Separately, also discussed recent echocardiogram results with the patient, details below.  Vitals:   06/02/24 1440  Pulse: (!) 53  SpO2: 97%       ROS:  Review of Systems  Cardiovascular:  Positive for chest pain. Negative for dyspnea on exertion, leg swelling, palpitations and syncope.     Studies Reviewed: Ryan [name]        EKG 06/02/2024: Sinus bradycardia with 1st degree A-V block Rightward axis Nonspecific ST and T wave abnormality When compared with ECG of 15-Feb-2024 11:10, QRS axis Shifted right T wave inversion now evident in Inferior leads Nonspecific T wave abnormality, improved in Lateral leads  Echocardiogram 03/2024:  1. Severe concentric hypertrophy with severely reduced mitral tissue  doppler velocities. Strain with apical sparing. Findings concerning for  cardiac amyloidosis,  consider CMR. Left ventricular ejection fraction, by  estimation, is 50 to 55%. The left  ventricle has low normal function. The left ventricle demonstrates  regional wall motion abnormalities (see scoring diagram/findings for  description). There is severe concentric left ventricular hypertrophy.  Indeterminate diastolic filling due to E-A  fusion. The average left ventricular global longitudinal strain is -12.5  %. The global longitudinal strain is abnormal.   2. Right ventricular systolic function is mildly reduced. The right  ventricular size is mildly enlarged. There is normal pulmonary artery  systolic pressure. The estimated right ventricular systolic pressure is  35.5 mmHg.   3. Left atrial size was mildly dilated.   4. The mitral valve is grossly normal. Mild mitral valve regurgitation.  No evidence of mitral stenosis.   5. Paradoxical low flow low gradient severe aortic stenosis is present.  Vmax 2.1 m/s, MG 10 mmHG, AVA 0.75 cm2, DI 0.24. SVi 26 cc/m2. Concerns  for cardiac amyloidosis. The aortic valve is tricuspid. There is severe  calcifcation of the aortic valve. There is severe thickening of the aortic valve.  Aortic valve regurgitation is not visualized. Severe aortic valve stenosis.   6. The inferior vena cava is normal in size with greater than 50%  respiratory variability, suggesting right atrial pressure of 3 mmHg.   Comparison(s): No significant change from prior study.    RHC 11/2023: 1. Normal RA pressure and normal mean PCWP, but prominent V-waves in PCWP tracing likely reflective of diastolic dysfunction (no significant MR on last echo).  2. Borderline elevated PA pressure.  3. Preserved CO by Fick but low by thermodilution.  Based on current improved symptoms, suspect Fick may be more accurate.   Labs 02/2024: Chol 105, TG 68, HDL 47, LDL 44 Hb 11.3 Cr 2.13    Risk Assessment/Calculations:    CHA2DS2-VASc Score = 7  This indicates a 11.2% annual  risk of stroke. The patient's score is based upon: CHF History: 1 HTN History: 1 Diabetes History: 0 Stroke History: 2 Vascular Disease History: 1 Age Score: 2 Gender Score: 0    Physical Exam:   Physical Exam Vitals and nursing note reviewed.  Constitutional:      General: He is not in acute distress. Neck:     Vascular: No JVD.   Cardiovascular:     Rate and Rhythm: Normal rate. Rhythm irregular.     Heart sounds: Murmur heard.     Harsh midsystolic murmur is present with a grade of 3/6 at the upper right sternal border radiating to the neck.     High-pitched blowing holosystolic murmur of grade 2/6 is also present at the apex.  Pulmonary:     Effort: Pulmonary effort is normal.     Breath sounds: Normal breath sounds. No wheezing or rales.   Musculoskeletal:     Right lower leg: No edema.     Left lower leg: No edema.      VISIT DIAGNOSES:   ICD-10-CM   1. Coronary artery disease of native artery of native heart with stable angina pectoris (HCC)  I25.118 EKG 12-Lead    2. Severe aortic stenosis  I35.0 Ambulatory referral to Structural Heart/Valve Clinic (only at [facility])    3. HFrEF (heart failure with reduced ejection fraction) (HCC)  I50.20          ASSESSMENT AND PLAN: .    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 male with CAD s/p CABG (patent LIMA-LAD, occluded SVG-ramus/OM Y graft, occluded SVG-PDA), ischemic cardiomyopathy, s/p ICD, s/p barostim implantation, NSVT, aortic stenosis, persistent Afib, PAD s/p aortobifemoral bypass, mod carotid disease, h/o stroke, CKD III   CAD: Prior CABG, coronary anatomy not suitable for any further revascularization, either surgical or percutaneous. Management of angina will remain medical only. I did Imdur  30 mg daily. He wishes to come off Ranexa  due to side effects, okay with me.  Severe aortic stenosis: Paradoxically low-flow low gradient aortic stenosis, that is persistent in spite of improvement in his EF post  Barostim plantation. His recent chest pain could well have been due to his severe aortic stenosis also. I will refer him to TAVR clinic for consideration for TAVR.  If TAVR is being considered, I will then perform right heart catheterization and coronary and bypass graft angiography at that point.  Suspected cardiac amyloidosis: Given his severe aortic valve calcification, and apical sparing noted on strain imaging, there is suspicion for cardiac amyloidosis.  His EF has significantly improved, and currently does not have any severe heart failure symptoms.  I will defer to Dr. Melvena Stabler expertise if we should pursue further cardiac amyloidosis workup or not.  HFrEF: Ischemic cardiomyopathy NYHA class III s/p ICD and Barostim implantation. Fairly euvolemic on physical exam. GDMT for heart failure has been severely limited due to his ability to take medications without having side effects including dizziness and nausea. Currently on bisoprolol  5 mg daily, digoxin  1/2 tablet of 0.0625 mg every other day, Entresto  1 tablet of 24-26 mg twice daily.  NSVT: Ischemic cardiomyopathy, has ICD with h/o ATP for VT  episodes. Currently on amiodarone  200 mg daily.  Paroxysmal A-fib/flutter:  Continue Eliquis  2.5 mg daily.     CKD stage III: Stable   PAD: S/p aortobifem bypass.  No critical limb ischemia.     F/u in 6 months  Signed, Cody Das, MD

## 2024-06-02 NOTE — Patient Instructions (Signed)
 Medication Instructions:  STOP Ranexa   START Imdur  30 mg daily   *If you need a refill on your cardiac medications before your next appointment, please call your pharmacy*  Follow-Up: At [facility], you and your health needs are our priority.  As part of our continuing mission to provide you with exceptional heart care, our providers are all part of one team.  This team includes your primary Cardiologist (physician) and Advanced Practice Providers or APPs (Physician Assistants and Nurse Practitioners) who all work together to provide you with the care you need, when you need it.  Your next appointment:   3 month(s)  Provider:   Cody Das, MD    We recommend signing up for the patient portal called MyChart.  Sign up information is provided on this After Visit Summary.  MyChart is used to connect with patients for Virtual Visits (Telemedicine).  Patients are able to view lab/test results, encounter notes, upcoming appointments, etc.  Non-urgent messages can be sent to your provider as well.   To learn more about what you can do with MyChart, go to [email or web address].

## 2024-06-06 ENCOUNTER — Telehealth (HOSPITAL_COMMUNITY): Payer: Self-pay

## 2024-06-06 NOTE — Telephone Encounter (Signed)
 As per Dr. Mitzie Anda, attempted to reach patient in regards to scheduling  Cardiac MRI. Left number to call back

## 2024-06-07 ENCOUNTER — Encounter (HOSPITAL_COMMUNITY): Payer: Self-pay

## 2024-06-07 ENCOUNTER — Telehealth (HOSPITAL_COMMUNITY): Payer: Self-pay

## 2024-06-07 ENCOUNTER — Other Ambulatory Visit (HOSPITAL_COMMUNITY): Payer: Self-pay

## 2024-06-07 DIAGNOSIS — I5042 Chronic combined systolic (congestive) and diastolic (congestive) heart failure: Secondary | ICD-10-CM

## 2024-06-07 NOTE — Telephone Encounter (Signed)
 Spoke to patient about ordering a cardiac MRI.As per Dr. Charline Contras request.Order placed.

## 2024-06-07 NOTE — Telephone Encounter (Signed)
 Wnife aware

## 2024-06-28 ENCOUNTER — Ambulatory Visit (INDEPENDENT_AMBULATORY_CARE_PROVIDER_SITE_OTHER): Payer: Medicare Other

## 2024-06-28 DIAGNOSIS — I472 Ventricular tachycardia, unspecified: Secondary | ICD-10-CM | POA: Diagnosis not present

## 2024-06-29 ENCOUNTER — Ambulatory Visit: Payer: Self-pay | Admitting: Internal Medicine

## 2024-06-29 LAB — CUP PACEART REMOTE DEVICE CHECK
Battery Remaining Longevity: 150 mo
Battery Remaining Percentage: 100 %
Brady Statistic RV Percent Paced: 0 %
Date Time Interrogation Session: 20250709023200
HighPow Impedance: 56 Ohm
Implantable Lead Connection Status: 753985
Implantable Lead Implant Date: 20220110
Implantable Lead Location: 753860
Implantable Lead Model: 137
Implantable Lead Serial Number: 301176
Implantable Pulse Generator Implant Date: 20220110
Lead Channel Impedance Value: 540 Ohm
Lead Channel Setting Pacing Amplitude: 2.5 V
Lead Channel Setting Pacing Pulse Width: 0.4 ms
Lead Channel Setting Sensing Sensitivity: 0.5 mV
Pulse Gen Serial Number: 211464

## 2024-07-03 ENCOUNTER — Ambulatory Visit: Admitting: Internal Medicine

## 2024-07-07 ENCOUNTER — Other Ambulatory Visit: Payer: Self-pay | Admitting: Cardiology

## 2024-07-07 DIAGNOSIS — R42 Dizziness and giddiness: Secondary | ICD-10-CM

## 2024-07-10 DIAGNOSIS — E039 Hypothyroidism, unspecified: Secondary | ICD-10-CM | POA: Diagnosis not present

## 2024-07-10 DIAGNOSIS — E7849 Other hyperlipidemia: Secondary | ICD-10-CM | POA: Diagnosis not present

## 2024-07-12 NOTE — H&P (View-Only) (Signed)
 Patient ID: Ryan [name] MRN: [id number] DOB/AGE: 83-02-1941 83 y.o.  Primary Care Physician:Shaw, Ryan [name]., MD Primary Cardiologist: Ryan [name]  CC:  Aortic valvular disease management     FOCUSED PROBLEM LIST:   Aortic stenosis AVA 0.75, DI 0.24, SVI 26, MG 10, EF 50 to 55% TTE April 2025 EKG sinus bradycardia with first-degree AV block Diastolic dysfunction Severe concentric LVH >> concern for amyloid CMR contraindicated due to presence of Baro-stim implant ICM EF 35% TTE 2022 EF 25 to 30% TTE 2024 Med Rx Intolerant of SGLT2 inhibitors Intolerant of ACE inhibitors EF 55 to 55% TTE 2025 Status post Barostim implantation March 2025 VT Status post ICD 2022 CAD Status post CABG with LIMA to LAD, Y graft to D1 and D2, VG to PDA 2016 Vein grafts occluded, LIMA to LAD patent, successful PCI left circumflex 2023 Persistent atrial fibrillation On Eliquis  CKD stage IIIa` PAD Status post aortobifemoral bypass 1987 Status post left carotid endarterectomy 2017 BSA 1.7/BMI 11 July 2024:  Patient consents to use of AI scribe. The patient is an 83 year old male with the above listed medical problems here for recommendations regarding his aortic valvular disease.  He was seen by heart failure division in January.  He was endorsing exertional shortness of breath.  He underwent Barostim implantation in March.  He was then seen by EP and was doing very well.  An echocardiogram done in March demonstrated paradoxical low-flow low gradient aortic stenosis with a stroke-volume index of less than 26 and a mean gradient of 10.  The valve was severely calcified.  He is referred for further recommendations regarding his aortic valvular disease.  He saw electrophysiology today.  Unfortunately due to the presence of his barrow stimulator he cannot get his cardiac MRI for his amyloid evaluation.  He is experiencing shortness of breath, particularly during physical exertion such as walking,  necessitating frequent stops to rest. He also reports hip pain during ambulation, which contributes to his need to pause.  He also reports fatigue and shortness of breath sometimes not accompanied by hip pain.  He has a defibrillator, with the last shock occurring two years ago.   He has atrial fibrillation and is on Eliquis . He has temporarily discontinued Entresto  and potassium due to feeling unwell but plans to restart potassium. He experiences lightheadedness when transitioning from sitting to standing, occurring about five to six times weekly, and had a fall three weeks ago due to dizziness, resulting in arm injuries but no head trauma.  He reports leg swelling attributed to fluid retention, managed with diuretics, and attempts to limit salt intake to control the swelling. He has experienced significant weight loss, approximately 10 pounds in two weeks, with a current weight of 119 pounds, down from 135 pounds, attributed to a lack of appetite and reduced food intake.  He has no upper teeth and has no issues with his remaining lower teeth.       Past Medical History:  Diagnosis Date   Acquired thrombophilia (HCC) 11/01/2021   AICD (automatic cardioverter/defibrillator) present    [employer]   Arthritis    CAD (coronary artery disease)    Carotid artery occlusion    Cataract    Bil eyes/worse in left eye   CHF (congestive heart failure) (HCC)    Chronic back pain    COVID-19 10/31/2021   DVT (deep venous thrombosis) (HCC)    Dysrhythmia    Enlarged prostate    takes Rapaflo daily  GERD (gastroesophageal reflux disease)    occasional   History of colon polyps    History of gout    has colchicine  prn   History of kidney stones    Hyperlipidemia    takes Crestor  daily   Hypertension    takes Amlodipine  daily   Hypothyroidism    Myocardial infarction [facility])    Peripheral vascular disease (HCC)    Prolonged QT interval 11/01/2021   Pulmonary emboli (HCC) 03/20/2015    elevated d-dimer, intermediate V/Q study, atypical chest pain and SOB. Start on Xarelto  20mg  BID for 3 month   Rapid atrial fibrillation (HCC)    Renal insufficiency    Shortness of breath dyspnea    Urinary frequency    Urinary urgency     Past Surgical History:  Procedure Laterality Date   APPENDECTOMY     BACK SURGERY     5 times   big toe surgery     CARDIAC CATHETERIZATION     2010    dr Ryan [name]   cataract surgery     left eye   CHOLECYSTECTOMY N/A 07/27/2016   Procedure: LAPAROSCOPIC CHOLECYSTECTOMY;  Surgeon: Ryan Beverley Bureau, MD;  Location: [facility] OR;  Service: General;  Laterality: N/A;   COLONOSCOPY     CORONARY ARTERY BYPASS GRAFT N/A 04/05/2015   Procedure: CORONARY ARTERY BYPASS GRAFTING (CABG)X4 LIMA-LAD; SVG-DIAG1-DIAG2; SVG-PD;  Surgeon: Ryan Ryan Millers, MD;  Location: [facility] OR;  Service: Open Heart Surgery;  Laterality: N/A;   CORONARY BALLOON ANGIOPLASTY N/A 04/14/2022   Procedure: CORONARY BALLOON ANGIOPLASTY;  Surgeon: Ryan [name] Ryan PARAS, MD;  Location: [facility] INVASIVE CV LAB;  Service: Cardiovascular;  Laterality: N/A;   CORONARY ULTRASOUND/IVUS N/A 04/14/2022   Procedure: Intravascular Ultrasound/IVUS;  Surgeon: Ryan [name] Ryan PARAS, MD;  Location: [facility] INVASIVE CV LAB;  Service: Cardiovascular;  Laterality: N/A;   CORONARY/GRAFT ANGIOGRAPHY N/A 04/22/2018   Procedure: CORONARY/GRAFT ANGIOGRAPHY;  Surgeon: Ryan [name], Ryan M, MD;  Location: [facility] INVASIVE CV LAB;  Service: Cardiovascular;  Laterality: N/A;   CYSTOSCOPY     ENDARTERECTOMY Left 04/24/2016   Procedure: ENDARTERECTOMY LEFT CAROTID;  Surgeon: Ryan Ryan Collum, MD;  Location: [facility] OR;  Service: Vascular;  Laterality: Left;   EYE SURGERY     FEMORAL ARTERY - POPLITEAL ARTERY BYPASS GRAFT     ICD IMPLANT N/A 12/30/2020   Procedure: ICD IMPLANT;  Surgeon: Ryan Ryan ORN, MD;  Location: [facility] INVASIVE CV LAB;  Service: Cardiovascular;  Laterality: N/A;   JOINT REPLACEMENT     shoulder   LEFT HEART CATH N/A 04/14/2022    Procedure: Left Heart Cath;  Surgeon: Ryan [name] Ryan PARAS, MD;  Location: [facility] INVASIVE CV LAB;  Service: Cardiovascular;  Laterality: N/A;   LEFT HEART CATH AND CORONARY ANGIOGRAPHY N/A 04/24/2022   Procedure: LEFT HEART CATH AND CORONARY ANGIOGRAPHY;  Surgeon: Ladona Heinz, MD;  Location: [facility] INVASIVE CV LAB;  Service: Cardiovascular;  Laterality: N/A;   LEFT HEART CATHETERIZATION WITH CORONARY ANGIOGRAM N/A 04/03/2015   Procedure: LEFT HEART CATHETERIZATION WITH CORONARY ANGIOGRAM;  Surgeon: Debby DELENA Sor, MD;  Location[facility] CATH LAB;  Service: Cardiovascular;  Laterality: N/A;   LUMBAR LAMINECTOMY  01/06/2013   Procedure: MICRODISCECTOMY LUMBAR LAMINECTOMY;  Surgeon: Oneil Ryan Herald, MD;  Location: [facility] OR;  Service: Orthopedics;  Laterality: N/A;  L3-4 decompression   LUMBAR LAMINECTOMY/DECOMPRESSION MICRODISCECTOMY  02/12/2012   Procedure: LUMBAR LAMINECTOMY/DECOMPRESSION MICRODISCECTOMY;  Surgeon: Catalina CHRISTELLA Stains, MD;  Location: [facility] NEURO ORS;  Service: Neurosurgery;  Laterality: N/A;  Lumbar four-five laminectomy  PATCH ANGIOPLASTY Left 04/24/2016   Procedure: LEFT CAROTID ARTERY PATCH ANGIOPLASTY;  Surgeon: Ryan Ryan Collum, MD;  Location: [facility] OR;  Service: Vascular;  Laterality: Left;   RIGHT HEART CATH N/A 11/22/2023   Procedure: RIGHT HEART CATH;  Surgeon: Rolan Ezra RAMAN, MD;  Location: [facility] INVASIVE CV LAB;  Service: Cardiovascular;  Laterality: N/A;   RIGHT HEART CATH AND CORONARY/GRAFT ANGIOGRAPHY N/A 01/30/2019   Procedure: RIGHT HEART CATH AND CORONARY/GRAFT ANGIOGRAPHY;  Surgeon: Burnard Debby LABOR, MD;  Location: [facility] INVASIVE CV LAB;  Service: Cardiovascular;  Laterality: N/A;   RIGHT/LEFT HEART CATH AND CORONARY ANGIOGRAPHY N/A 03/17/2022   Procedure: RIGHT/LEFT HEART CATH AND CORONARY ANGIOGRAPHY;  Surgeon: Ryan [name] Ryan PARAS, MD;  Location: [facility] INVASIVE CV LAB;  Service: Cardiovascular;  Laterality: N/A;   STERIOD INJECTION Right 01/09/2014   Procedure: STEROID INJECTION;  Surgeon: Lonni CINDERELLA Poli, MD;  Location: [facility] OR;  Service: Orthopedics;  Laterality: Right;   TEE WITHOUT CARDIOVERSION N/A 04/05/2015   Procedure: TRANSESOPHAGEAL ECHOCARDIOGRAM (TEE);  Surgeon: Ryan Ryan Millers, MD;  Location: [facility] OR;  Service: Open Heart Surgery;  Laterality: N/A;   TOTAL HIP ARTHROPLASTY Left 01/09/2014   DR POLI   TOTAL HIP ARTHROPLASTY Left 01/09/2014   Procedure: LEFT TOTAL HIP ARTHROPLASTY ANTERIOR APPROACH and Steroid Injection Right hip;  Surgeon: Lonni CINDERELLA Poli, MD;  Location: [facility] OR;  Service: Orthopedics;  Laterality: Left;   TOTAL HIP ARTHROPLASTY Right 08/15/2019   TOTAL HIP ARTHROPLASTY Right 08/15/2019   Procedure: RIGHT TOTAL HIP ARTHROPLASTY ANTERIOR APPROACH;  Surgeon: Poli Lonni CINDERELLA, MD;  Location: [facility] OR;  Service: Orthopedics;  Laterality: Right;    Family History  Problem Relation Age of Onset   Heart disease Father    Heart attack Father    Heart disease Sister    Hypertension Sister    Heart attack Sister    Hypertension Mother    Diabetes Son     Social History   Socioeconomic History   Marital status: Married    Spouse name: Saif Ryan   Number of children: 2   Years of education: Not on file   Highest education level: 9th grade  Occupational History   Not on file  Tobacco Use   Smoking status: Former    Current packs/day: 0.00    Average packs/day: 1 pack/day for 25.0 years (25.0 ttl pk-yrs)    Types: Cigarettes    Start date: 02/04/1962    Quit date: 02/04/1987    Years since quitting: 37.4   Smokeless tobacco: Former    Types: Chew    Quit date: 07/20/2009   Tobacco comments:    quit 35+yrs ago  Vaping Use   Vaping status: Never Used  Substance and Sexual Activity   Alcohol  use: No    Alcohol /week: 0.0 standard drinks of alcohol    Drug use: No   Sexual activity: Not Currently  Other Topics Concern   Not on file  Social History Narrative   Not on file   Social Drivers of Health   Financial Resource Strain: Low  Risk  (08/28/2021)   Overall Financial Resource Strain (CARDIA)    Difficulty of Paying Living Expenses: Not hard at all  Food Insecurity: No Food Insecurity (02/23/2024)   Hunger Vital Sign    Worried About Running Out of Food in the Last Year: Never true    Ran Out of Food in the Last Year: Never true  Transportation Needs: No Transportation Needs (02/23/2024)   PRAPARE - Transportation  Lack of Transportation (Medical): No    Lack of Transportation (Non-Medical): No  Physical Activity: Not on file  Stress: Not on file  Social Connections: Moderately Integrated (02/23/2024)   Social Connection and Isolation Panel    Frequency of Communication with Friends and Family: More than three times a week    Frequency of Social Gatherings with Friends and Family: More than three times a week    Attends Religious Services: More than 4 times per year    Active Member of [employer] or Organizations: No    Attends Banker Meetings: Never    Marital Status: Married  Catering manager Violence: Not At Risk (02/23/2024)   Humiliation, Afraid, Rape, and Kick questionnaire    Fear of Current or Ex-Partner: No    Emotionally Abused: No    Physically Abused: No    Sexually Abused: No     Prior to Admission medications   Medication Sig Start Date End Date Taking? Authorizing Provider  acetaminophen  (TYLENOL ) 325 MG tablet Take 2 tablets (650 mg total) by mouth every 6 (six) hours as needed for mild pain (or Fever >/= 101). 11/19/21   Sherrill Cable Latif, DO  ALPRAZolam  (XANAX ) 0.5 MG tablet Take 0.5 mg by mouth 3 (three) times daily as needed for anxiety or sleep. 10/07/21   [provider]  amiodarone  (PACERONE ) 200 MG tablet Take 1 tablet (200 mg total) by mouth daily. 11/11/23   Patwardhan, Ryan PARAS, MD  apixaban  (ELIQUIS ) 5 MG TABS tablet Take 2.5 mg by mouth 2 (two) times daily.    [provider]  bisacodyl  (DULCOLAX) 5 MG EC tablet Take 10 mg by mouth at bedtime.    [provider]  bisoprolol  (ZEBETA ) 5 MG tablet Take 1 tablet (5 mg total) by mouth daily. 08/05/23   Ryan Ryan ORN, MD  colchicine  0.6 MG tablet Take 1 tablet (0.6 mg total) by mouth 2 (two) times daily. 01/03/24   Magdalen Pasco RAMAN, DPM  digoxin  (LANOXIN ) 0.125 MG tablet Take 0.5 tablets (0.0625 mg total) by mouth every other day. 01/26/24   Rolan Ezra RAMAN, MD  Evolocumab  (REPATHA  SURECLICK) 140 MG/ML SOAJ Inject 140 mg into the skin every 14 (fourteen) days. 03/23/24   Patwardhan, Ryan PARAS, MD  isosorbide  mononitrate (IMDUR ) 30 MG 24 hr tablet Take 1 tablet (30 mg total) by mouth daily. 06/02/24   Patwardhan, Ryan PARAS, MD  levothyroxine  (SYNTHROID ) 50 MCG tablet Take 1 tablet (50 mcg total) by mouth daily before breakfast. 01/13/24   Rolan Ezra RAMAN, MD  meclizine  (ANTIVERT ) 12.5 MG tablet TAKE TWO TABLETS BY MOUTH THREE TIMES A DAY AS NEEDED 04/06/24   Patwardhan, Ryan PARAS, MD  methocarbamol  (ROBAXIN ) 500 MG tablet Take 500 mg by mouth daily as needed for muscle spasms (back pain.).    [provider]  nitroGLYCERIN  (NITROSTAT ) 0.4 MG SL tablet Place 1 tablet (0.4 mg total) under the tongue every 5 (five) minutes as needed for chest pain. 07/09/21   Ryan Ryan ORN, MD  oxyCODONE  (ROXICODONE ) 5 MG immediate release tablet Take 1 tablet (5 mg total) by mouth every 6 (six) hours as needed. 02/24/24   Rhyne, Samantha J, PA-C  pantoprazole  (PROTONIX ) 40 MG tablet Take 1 tablet (40 mg total) by mouth daily. 11/12/21   Lelon Hamilton T, PA-C  polyethylene glycol powder (GLYCOLAX /MIRALAX ) 17 GM/SCOOP powder Take 17 g by mouth at bedtime. 01/21/22   [provider]  potassium chloride  SA (KLOR-CON  [name]) 10 MEQ tablet  Take 1 tablet (10 mEq total) by mouth daily. 12/29/23   Milford, Harlene HERO, FNP  sacubitril -valsartan  (ENTRESTO ) 24-26 MG Take 1 tablet by mouth 2 (two) times daily. 01/12/24   Rolan Ezra RAMAN, MD  tobramycin  (TOBREX ) 0.3 % ophthalmic solution Place 1 drop into the left eye daily as needed  (Dry eyes).    [provider]  torsemide  (DEMADEX ) 20 MG tablet Take 40 mg by mouth daily.    [provider]    Allergies  Allergen Reactions   Jardiance  [Empagliflozin ] Nausea Only and Other (See Comments)    Made patient very sick to the stomach.   Ezetimibe  Other (See Comments)    Myalgias    Ace Inhibitors Cough    Other reaction(s): cough   Aspirin  Other (See Comments)    skin rash/easy bruising   Atorvastatin  Other (See Comments)    weakness, fatigue (severe)   Ezetimibe -Simvastatin Other (See Comments)    myalgias   Rosuvastatin  Other (See Comments)    Myalgias/weakness   Codeine  Nausea And Vomiting         Unithroid  [Levothyroxine  Sodium] Other (See Comments)    Caused blurry vision per pt    REVIEW OF SYSTEMS:  General: no fevers/chills/night sweats Eyes: no blurry vision, diplopia, or amaurosis ENT: no sore throat or hearing loss Resp: no cough, wheezing, or hemoptysis CV: no edema or palpitations GI: no abdominal pain, nausea, vomiting, diarrhea, or constipation GU: no dysuria, frequency, or hematuria Skin: no rash Neuro: no headache, numbness, tingling, or weakness of extremities Musculoskeletal: no joint pain or swelling Heme: no bleeding, DVT, or easy bruising Endo: no polydipsia or polyuria  BP (!) 114/50   Pulse (!) 55   Ht 5' 5 (1.651 [name])   Wt 124 lb (56.2 kg)   SpO2 98%   BMI 20.63 kg/[name]   PHYSICAL EXAM: GEN:  AO x 3 in no acute distress HEENT: normal Dentition: Missing upper teeth and remaining lower teeth look to be intact Neck: JVP normal. +2 carotid upstrokes without bruits: left carotid endarterectomy incision well-healed. No thyromegaly. Lungs: equal expansion, clear bilaterally CV: Apex is discrete and nondisplaced, RRR with 3 out of 6 systolic murmur Abd: soft, non-tender, non-distended; no bruit; positive bowel sounds Ext: no edema, ecchymoses, or cyanosis Vascular: +1 right radial pulse, +2 left radial pulse      Skin: warm and dry without rash Neuro: CN II-XII grossly intact; motor and sensory grossly intact    DATA AND STUDIES:  EKG: June 2025 sinus bradycardia with first-degree AV block  EKG Interpretation Date/Time:    Ventricular Rate:    PR Interval:    QRS Duration:    QT Interval:    QTC Calculation:   R Axis:      Text Interpretation:          11/11/2023: NT-Pro BNP 4,367 01/12/2024: TSH 28.439 02/23/2024: ALT 10 02/24/2024: BUN 18; Creatinine, Ser 1.35; Hemoglobin 11.3; Platelets 255; Potassium 4.4; Sodium 135   STS RISK CALCULATOR: Pending  NHYA CLASS: 2    ASSESSMENT AND PLAN:   1. Nonrheumatic aortic valve stenosis   2. Diastolic dysfunction with chronic heart failure (HCC)   3. Ischemic cardiomyopathy   4. VT (ventricular tachycardia) (HCC)   5. Coronary artery disease of native artery of native heart with stable angina pectoris (HCC)   6. Persistent atrial fibrillation (HCC)   7. Secondary hypercoagulable state (HCC)   8. Stage 3a chronic kidney disease (HCC)   9. PAD (peripheral  artery disease) (HCC)     Aortic stenosis: I reviewed the patient's echocardiogram which demonstrates paradoxical low-flow low gradient aortic stenosis.  His ejection fraction had been abnormal but has improved with intensive medical therapy.  It is clear he is symptomatic with dyspnea, fatigue, and more frequent presyncope and I attribute this  to his paradoxical low-flow low gradient aortic stenosis.  He certainly may have underlying amyloid.  Unfortunately due to the presence of his Barostim implant he cannot obtain a cardiac MRI.  I had a long discussion with the patient about potential treatment options.  He would like to pursue an aortic valve intervention.  Will refer him for coronary angiography study, right heart catheterization, TAVR protocol CTA, and cardiothoracic surgery evaluation.  Further recommendations will be issued when these tests have been reviewed.   He has experienced  an unintentional weight loss.  The TAVR protocol CTA will also allow us  to assess for any underlying malignancy. Diastolic dysfunction: Patient is to have MRI to evaluate for amyloid.  While amyloid is not a contraindication to TAVR it is associated with relatively poor outcomes when compared to individuals without amyloid.  Will follow-up MRI August 18.  Continue Entresto  24 x 26 mg twice daily.  Defer SGLT 2 inhibitor due to intolerance.  Consider spironolactone .  Continue bisoprolol  5 mg daily. Ischemic cardiomyopathy: Continue digoxin  0.125 mg daily, Entresto  24 x 26 mg twice daily, torsemide  40 mg daily, and consider spironolactone . VT: Continue amiodarone  200 mg daily.  Followed by EP. Coronary artery disease: Continue Eliquis  5 mg twice daily, Repatha  140 mg every 2 weeks. Persistent atrial fibrillation: Continue Eliquis  5 mg twice daily Secondary hypercoagulable state: Continue Eliquis  5 mg twice daily. CKD stage IIIa: Continue Entresto  24 x 26 mg twice daily; defer SGLT2 inhibitor due to intolerance. Peripheral arterial disease.  If TAVR is pursued we will need to evaluate feasibility of transfemoral access given history of aortobifemoral bypass.  His bypass procedure was done in the 80s.   I have personally reviewed the patients imaging data as summarized above.  I have reviewed the natural history of aortic stenosis with the patient and family members who are present today. We have discussed the limitations of medical therapy and the poor prognosis associated with symptomatic aortic stenosis. We have also reviewed potential treatment options, including palliative medical therapy, conventional surgical aortic valve replacement, and transcatheter aortic valve replacement. We discussed treatment options in the context of this patient's specific comorbid medical conditions.   All of the patient's questions were answered today. Will make further recommendations based on the results of studies  outlined above.   I spent 58 minutes reviewing all clinical data during and prior to this visit including all relevant imaging studies, laboratories, clinical information from other health systems and prior notes from both Cardiology and other specialties, interviewing the patient, conducting a complete physical examination, and coordinating care in order to formulate a comprehensive and personalized evaluation and treatment plan.   Preesha Benjamin K Charnise Lovan, MD  07/18/2024 11:27 AM    [facility] [address] [place], [place], [place]  [address] Phone: [phone]; Fax: [phone]

## 2024-07-12 NOTE — Progress Notes (Unsigned)
 Patient ID: Ryan [name] MRN: [id number] DOB/AGE: [birth year] 83 y.o.  Primary Care Physician:Shaw, Elsie [name], MD Primary Cardiologist: Elmira  CC:  Aortic valvular disease management     FOCUSED PROBLEM LIST:   Aortic stenosis AVA 0.75, DI 0.24, SVI 26, MG 10, EF 50 to 55% TTE April 2025 EKG sinus bradycardia with first-degree AV block Diastolic dysfunction Severe concentric LVH >> concern for amyloid CMR pending ICM EF 35% TTE 2022 EF 25 to 30% TTE 2024 Med Rx Intolerant of SGLT2 inhibitors Intolerant of ACE inhibitors EF 55 to 55% TTE 2025 Status post Barostim implantation March 2025 VT Status post ICD 2022 CAD Status post CABG with LIMA to LAD, Y graft to D1 and D2, VG to PDA 2016 Vein grafts occluded, LIMA to LAD patent, successful PCI left circumflex 2023 Persistent atrial fibrillation On Eliquis  CKD stage IIIa` PAD Status post aortobifemoral bypass 1987 Status post left carotid endarterectomy 2017 BSA 1.7/BMI 11 July 2024:  Patient consents to use of AI scribe. The patient is an 83 year old male with the above listed medical problems here for recommendations regarding his aortic valvular disease.  He was seen by heart failure division in January.  He was endorsing exertional shortness of breath.  He underwent Barostim implantation in March.  He was then seen by EP and was doing very well.  An echocardiogram done in March demonstrated paradoxical low-flow low gradient aortic stenosis with a stroke-volume index of less than 26 and a mean gradient of 10.  The valve was severely calcified.  He is referred for further recommendations regarding his aortic valvular disease.           Past Medical History:  Diagnosis Date   Acquired thrombophilia (HCC) 11/01/2021   AICD (automatic cardioverter/defibrillator) present    [employer]   Arthritis    CAD (coronary artery disease)    Carotid artery occlusion    Cataract    Bil eyes/worse in left  eye   CHF (congestive heart failure) (HCC)    Chronic back pain    COVID-19 10/31/2021   DVT (deep venous thrombosis) (HCC)    Dysrhythmia    Enlarged prostate    takes Rapaflo daily   GERD (gastroesophageal reflux disease)    occasional   History of colon polyps    History of gout    has colchicine  prn   History of kidney stones    Hyperlipidemia    takes Crestor  daily   Hypertension    takes Amlodipine  daily   Hypothyroidism    Myocardial infarction [facility])    Peripheral vascular disease (HCC)    Prolonged QT interval 11/01/2021   Pulmonary emboli (HCC) 03/20/2015   elevated d-dimer, intermediate V/Q study, atypical chest pain and SOB. Start on Xarelto  20mg  BID for 3 month   Rapid atrial fibrillation (HCC)    Renal insufficiency    Shortness of breath dyspnea    Urinary frequency    Urinary urgency     Past Surgical History:  Procedure Laterality Date   APPENDECTOMY     BACK SURGERY     5 times   big toe surgery     CARDIAC CATHETERIZATION     2010    dr alveta   cataract surgery     left eye   CHOLECYSTECTOMY N/A 07/27/2016   Procedure: LAPAROSCOPIC CHOLECYSTECTOMY;  Surgeon: Herlene Beverley Bureau, MD;  Location: [facility] OR;  Service: General;  Laterality: N/A;   COLONOSCOPY  CORONARY ARTERY BYPASS GRAFT N/A 04/05/2015   Procedure: CORONARY ARTERY BYPASS GRAFTING (CABG)X4 LIMA-LAD; SVG-DIAG1-DIAG2; SVG-PD;  Surgeon: Elspeth JAYSON Millers, MD;  Location: [facility] OR;  Service: Open Heart Surgery;  Laterality: N/A;   CORONARY BALLOON ANGIOPLASTY N/A 04/14/2022   Procedure: CORONARY BALLOON ANGIOPLASTY;  Surgeon: Elmira Newman PARAS, MD;  Location: [facility] INVASIVE CV LAB;  Service: Cardiovascular;  Laterality: N/A;   CORONARY ULTRASOUND/IVUS N/A 04/14/2022   Procedure: Intravascular Ultrasound/IVUS;  Surgeon: Elmira Newman PARAS, MD;  Location: [facility] INVASIVE CV LAB;  Service: Cardiovascular;  Laterality: N/A;   CORONARY/GRAFT ANGIOGRAPHY N/A 04/22/2018   Procedure: CORONARY/GRAFT  ANGIOGRAPHY;  Surgeon: Swaziland, Peter M, MD;  Location: [facility] INVASIVE CV LAB;  Service: Cardiovascular;  Laterality: N/A;   CYSTOSCOPY     ENDARTERECTOMY Left 04/24/2016   Procedure: ENDARTERECTOMY LEFT CAROTID;  Surgeon: Lynwood JONETTA Collum, MD;  Location: [facility] OR;  Service: Vascular;  Laterality: Left;   EYE SURGERY     FEMORAL ARTERY - POPLITEAL ARTERY BYPASS GRAFT     ICD IMPLANT N/A 12/30/2020   Procedure: ICD IMPLANT;  Surgeon: Waddell Danelle ORN, MD;  Location: [facility] INVASIVE CV LAB;  Service: Cardiovascular;  Laterality: N/A;   JOINT REPLACEMENT     shoulder   LEFT HEART CATH N/A 04/14/2022   Procedure: Left Heart Cath;  Surgeon: Elmira Newman PARAS, MD;  Location: [facility] INVASIVE CV LAB;  Service: Cardiovascular;  Laterality: N/A;   LEFT HEART CATH AND CORONARY ANGIOGRAPHY N/A 04/24/2022   Procedure: LEFT HEART CATH AND CORONARY ANGIOGRAPHY;  Surgeon: Ladona Heinz, MD;  Location: [facility] INVASIVE CV LAB;  Service: Cardiovascular;  Laterality: N/A;   LEFT HEART CATHETERIZATION WITH CORONARY ANGIOGRAM N/A 04/03/2015   Procedure: LEFT HEART CATHETERIZATION WITH CORONARY ANGIOGRAM;  Surgeon: Debby DELENA Sor, MD;  Location: [facility] CATH LAB;  Service: Cardiovascular;  Laterality: N/A;   LUMBAR LAMINECTOMY  01/06/2013   Procedure: MICRODISCECTOMY LUMBAR LAMINECTOMY;  Surgeon: Oneil JAYSON Herald, MD;  Location: [facility] OR;  Service: Orthopedics;  Laterality: N/A;  L3-4 decompression   LUMBAR LAMINECTOMY/DECOMPRESSION MICRODISCECTOMY  02/12/2012   Procedure: LUMBAR LAMINECTOMY/DECOMPRESSION MICRODISCECTOMY;  Surgeon: Catalina CHRISTELLA Stains, MD;  Location: [facility] NEURO ORS;  Service: Neurosurgery;  Laterality: N/A;  Lumbar four-five laminectomy   PATCH ANGIOPLASTY Left 04/24/2016   Procedure: LEFT CAROTID ARTERY PATCH ANGIOPLASTY;  Surgeon: Lynwood JONETTA Collum, MD;  Location: [facility] OR;  Service: Vascular;  Laterality: Left;   RIGHT HEART CATH N/A 11/22/2023   Procedure: RIGHT HEART CATH;  Surgeon: Rolan Ezra RAMAN, MD;  Location: [facility] INVASIVE CV LAB;  Service:  Cardiovascular;  Laterality: N/A;   RIGHT HEART CATH AND CORONARY/GRAFT ANGIOGRAPHY N/A 01/30/2019   Procedure: RIGHT HEART CATH AND CORONARY/GRAFT ANGIOGRAPHY;  Surgeon: Sor Debby DELENA, MD;  Location: [facility] INVASIVE CV LAB;  Service: Cardiovascular;  Laterality: N/A;   RIGHT/LEFT HEART CATH AND CORONARY ANGIOGRAPHY N/A 03/17/2022   Procedure: RIGHT/LEFT HEART CATH AND CORONARY ANGIOGRAPHY;  Surgeon: Elmira Newman PARAS, MD;  Location: [facility] INVASIVE CV LAB;  Service: Cardiovascular;  Laterality: N/A;   STERIOD INJECTION Right 01/09/2014   Procedure: STEROID INJECTION;  Surgeon: Lonni CINDERELLA Poli, MD;  Location: [facility] OR;  Service: Orthopedics;  Laterality: Right;   TEE WITHOUT CARDIOVERSION N/A 04/05/2015   Procedure: TRANSESOPHAGEAL ECHOCARDIOGRAM (TEE);  Surgeon: Elspeth JAYSON Millers, MD;  Location: [facility] OR;  Service: Open Heart Surgery;  Laterality: N/A;   TOTAL HIP ARTHROPLASTY Left 01/09/2014   DR [facility]   TOTAL HIP ARTHROPLASTY Left 01/09/2014   Procedure: LEFT TOTAL HIP ARTHROPLASTY ANTERIOR  APPROACH and Steroid Injection Right hip;  Surgeon: Lonni CINDERELLA Poli, MD;  Location: [facility] OR;  Service: Orthopedics;  Laterality: Left;   TOTAL HIP ARTHROPLASTY Right 08/15/2019   TOTAL HIP ARTHROPLASTY Right 08/15/2019   Procedure: RIGHT TOTAL HIP ARTHROPLASTY ANTERIOR APPROACH;  Surgeon: Poli Lonni CINDERELLA, MD;  Location: [facility] OR;  Service: Orthopedics;  Laterality: Right;    Family History  Problem Relation Age of Onset   Heart disease Father    Heart attack Father    Heart disease Sister    Hypertension Sister    Heart attack Sister    Hypertension Mother    Diabetes Son     Social History   Socioeconomic History   Marital status: Married    Spouse name: Jasmine Maceachern   Number of children: 2   Years of education: Not on file   Highest education level: 9th grade  Occupational History   Not on file  Tobacco Use   Smoking status: Former    Current packs/day: 0.00    Average  packs/day: 1 pack/day for 25.0 years (25.0 ttl pk-yrs)    Types: Cigarettes    Start date: 02/04/1962    Quit date: 02/04/1987    Years since quitting: 37.4   Smokeless tobacco: Former    Types: Chew    Quit date: 07/20/2009   Tobacco comments:    quit 35+yrs ago  Vaping Use   Vaping status: Never Used  Substance and Sexual Activity   Alcohol  use: No    Alcohol /week: 0.0 standard drinks of alcohol    Drug use: No   Sexual activity: Not Currently  Other Topics Concern   Not on file  Social History Narrative   Not on file   Social Drivers of Health   Financial Resource Strain: Low Risk  (08/28/2021)   Overall Financial Resource Strain (CARDIA)    Difficulty of Paying Living Expenses: Not hard at all  Food Insecurity: No Food Insecurity (02/23/2024)   Hunger Vital Sign    Worried About Running Out of Food in the Last Year: Never true    Ran Out of Food in the Last Year: Never true  Transportation Needs: No Transportation Needs (02/23/2024)   PRAPARE - Administrator, Civil Service (Medical): No    Lack of Transportation (Non-Medical): No  Physical Activity: Not on file  Stress: Not on file  Social Connections: Moderately Integrated (02/23/2024)   Social Connection and Isolation Panel    Frequency of Communication with Friends and Family: More than three times a week    Frequency of Social Gatherings with Friends and Family: More than three times a week    Attends Religious Services: More than 4 times per year    Active Member of [employer] or Organizations: No    Attends Banker Meetings: Never    Marital Status: Married  Catering manager Violence: Not At Risk (02/23/2024)   Humiliation, Afraid, Rape, and Kick questionnaire    Fear of Current or Ex-Partner: No    Emotionally Abused: No    Physically Abused: No    Sexually Abused: No     Prior to Admission medications   Medication Sig Start Date End Date Taking? Authorizing Provider  acetaminophen  (TYLENOL ) 325  MG tablet Take 2 tablets (650 mg total) by mouth every 6 (six) hours as needed for mild pain (or Fever >/= 101). 11/19/21   Sherrill Cable Latif, DO  ALPRAZolam  (XANAX ) 0.5 MG tablet Take 0.5 mg by mouth  3 (three) times daily as needed for anxiety or sleep. 10/07/21   [provider]  amiodarone  (PACERONE ) 200 MG tablet Take 1 tablet (200 mg total) by mouth daily. 11/11/23   Patwardhan, Newman PARAS, MD  apixaban  (ELIQUIS ) 5 MG TABS tablet Take 2.5 mg by mouth 2 (two) times daily.    [provider]  bisacodyl  (DULCOLAX) 5 MG EC tablet Take 10 mg by mouth at bedtime.    [provider]  bisoprolol  (ZEBETA ) 5 MG tablet Take 1 tablet (5 mg total) by mouth daily. 08/05/23   Waddell Danelle ORN, MD  colchicine  0.6 MG tablet Take 1 tablet (0.6 mg total) by mouth 2 (two) times daily. 01/03/24   Magdalen Pasco RAMAN, DPM  digoxin  (LANOXIN ) 0.125 MG tablet Take 0.5 tablets (0.0625 mg total) by mouth every other day. 01/26/24   Rolan Ezra RAMAN, MD  Evolocumab  (REPATHA  SURECLICK) 140 MG/ML SOAJ Inject 140 mg into the skin every 14 (fourteen) days. 03/23/24   Patwardhan, Newman PARAS, MD  isosorbide  mononitrate (IMDUR ) 30 MG 24 hr tablet Take 1 tablet (30 mg total) by mouth daily. 06/02/24   Patwardhan, Newman PARAS, MD  levothyroxine  (SYNTHROID ) 50 MCG tablet Take 1 tablet (50 mcg total) by mouth daily before breakfast. 01/13/24   Rolan Ezra RAMAN, MD  meclizine  (ANTIVERT ) 12.5 MG tablet TAKE TWO TABLETS BY MOUTH THREE TIMES A DAY AS NEEDED 04/06/24   Patwardhan, Newman PARAS, MD  methocarbamol  (ROBAXIN ) 500 MG tablet Take 500 mg by mouth daily as needed for muscle spasms (back pain.).    [provider]  nitroGLYCERIN  (NITROSTAT ) 0.4 MG SL tablet Place 1 tablet (0.4 mg total) under the tongue every 5 (five) minutes as needed for chest pain. 07/09/21   Waddell Danelle ORN, MD  oxyCODONE  (ROXICODONE ) 5 MG immediate release tablet Take 1 tablet (5 mg total) by mouth every 6 (six) hours as needed. 02/24/24   Rhyne,  Samantha J, PA-C  pantoprazole  (PROTONIX ) 40 MG tablet Take 1 tablet (40 mg total) by mouth daily. 11/12/21   Lelon Hamilton T, PA-C  polyethylene glycol powder (GLYCOLAX /MIRALAX ) 17 GM/SCOOP powder Take 17 g by mouth at bedtime. 01/21/22   [provider]  potassium chloride  SA (KLOR-CON  M) 10 MEQ tablet Take 1 tablet (10 mEq total) by mouth daily. 12/29/23   Milford, Harlene HERO, FNP  sacubitril -valsartan  (ENTRESTO ) 24-26 MG Take 1 tablet by mouth 2 (two) times daily. 01/12/24   Rolan Ezra RAMAN, MD  tobramycin  (TOBREX ) 0.3 % ophthalmic solution Place 1 drop into the left eye daily as needed (Dry eyes).    [provider]  torsemide  (DEMADEX ) 20 MG tablet Take 40 mg by mouth daily.    [provider]    Allergies  Allergen Reactions   Jardiance  [Empagliflozin ] Nausea Only and Other (See Comments)    Made patient very sick to the stomach.   Ezetimibe  Other (See Comments)    Myalgias    Ace Inhibitors Cough    Other reaction(s): cough   Aspirin  Other (See Comments)    skin rash/easy bruising   Atorvastatin  Other (See Comments)    weakness, fatigue (severe)   Ezetimibe -Simvastatin Other (See Comments)    myalgias   Rosuvastatin  Other (See Comments)    Myalgias/weakness   Codeine  Nausea And Vomiting         Unithroid  [Levothyroxine  Sodium] Other (See Comments)    Caused blurry vision per pt    REVIEW OF SYSTEMS:  General: no fevers/chills/night sweats Eyes: no  blurry vision, diplopia, or amaurosis ENT: no sore throat or hearing loss Resp: no cough, wheezing, or hemoptysis CV: no edema or palpitations GI: no abdominal pain, nausea, vomiting, diarrhea, or constipation GU: no dysuria, frequency, or hematuria Skin: no rash Neuro: no headache, numbness, tingling, or weakness of extremities Musculoskeletal: no joint pain or swelling Heme: no bleeding, DVT, or easy bruising Endo: no polydipsia or polyuria  There were no vitals taken for this visit.  PHYSICAL  EXAM: GEN:  AO x 3 in no acute distress HEENT: normal Dentition: Normal*** Neck: JVP normal. +2***carotid upstrokes without bruits. No thyromegaly. Lungs: equal expansion, clear bilaterally CV: Apex is discrete and nondisplaced, RRR without murmur or gallop*** Abd: soft, non-tender, non-distended; no bruit; positive bowel sounds Ext: no edema, ecchymoses, or cyanosis Vascular: 2+ femoral pulses, 2+ radial pulses       Skin: warm and dry without rash Neuro: CN II-XII grossly intact; motor and sensory grossly intact    DATA AND STUDIES:  EKG: June 2025 sinus bradycardia with first-degree AV block  EKG Interpretation Date/Time:    Ventricular Rate:    PR Interval:    QRS Duration:    QT Interval:    QTC Calculation:   R Axis:      Text Interpretation:          11/11/2023: NT-Pro BNP 4,367 01/12/2024: TSH 28.439 02/23/2024: ALT 10 02/24/2024: BUN 18; Creatinine, Ser 1.35; Hemoglobin 11.3; Platelets 255; Potassium 4.4; Sodium 135   STS RISK CALCULATOR: Pending  NHYA CLASS: ***    ASSESSMENT AND PLAN:   1. Nonrheumatic aortic valve stenosis   2. Diastolic dysfunction with chronic heart failure (HCC)   3. Ischemic cardiomyopathy   4. VT (ventricular tachycardia) (HCC)   5. Coronary artery disease of native artery of native heart with stable angina pectoris (HCC)   6. Persistent atrial fibrillation (HCC)   7. Secondary hypercoagulable state (HCC)   8. Stage 3a chronic kidney disease (HCC)   9. PAD (peripheral artery disease) (HCC)     Aortic stenosis: I reviewed the patient's echocardiogram which demonstrates paradoxical low-flow low gradient aortic stenosis.  His ejection fraction had been abnormal but has improved with intensive medical therapy.*** Diastolic dysfunction: Patient is to have MRI to evaluate for amyloid.  While amyloid is not a contraindication to TAVR it is associated with relatively poor outcomes when compared to individuals without amyloid.  Will  follow-up MRI results.  Continue Entresto  24 x 26 mg twice daily.  Defer SGLT 2 inhibitor due to intolerance.  Consider spironolactone .  Continue bisoprolol  5 mg daily. Ischemic cardiomyopathy: Continue digoxin  0.125 mg daily, Entresto  24 x 26 mg twice daily, torsemide  40 mg daily, and consider spironolactone . VT: Continue amiodarone  200 mg daily.  Followed by EP. Coronary artery disease: Continue Eliquis  5 mg twice daily, Repatha  140 mg every 2 weeks. Persistent atrial fibrillation: Continue Eliquis  5 mg twice daily Secondary hypercoagulable state: Continue Eliquis  5 mg twice daily. CKD stage IIIa: Continue Entresto  24 x 26 mg twice daily; defer SGLT2 inhibitor due to intolerance. Peripheral arterial disease.  If TAVR is pursued we will need to evaluate feasibility of transfemoral access given history of aortobifemoral bypass.  His bypass procedure was done in the 80s.   I have personally reviewed the patients imaging data as summarized above.  I have reviewed the natural history of aortic stenosis with the patient and family members who are present today. We have discussed the limitations of medical therapy and the poor prognosis  associated with symptomatic aortic stenosis. We have also reviewed potential treatment options, including palliative medical therapy, conventional surgical aortic valve replacement, and transcatheter aortic valve replacement. We discussed treatment options in the context of this patient's specific comorbid medical conditions.   All of the patient's questions were answered today. Will make further recommendations based on the results of studies outlined above.   I spent *** minutes reviewing all clinical data during and prior to this visit including all relevant imaging studies, laboratories, clinical information from other health systems and prior notes from both Cardiology and other specialties, interviewing the patient, conducting a complete physical examination, and  coordinating care in order to formulate a comprehensive and personalized evaluation and treatment plan.   Valeria Boza K Zayde Stroupe, MD  07/12/2024 8:12 AM    [facility] [address] [place], [place], [place]  [address] Phone: [phone]; Fax: [phone]

## 2024-07-14 ENCOUNTER — Other Ambulatory Visit (HOSPITAL_COMMUNITY): Payer: Self-pay | Admitting: Cardiology

## 2024-07-16 ENCOUNTER — Other Ambulatory Visit: Payer: Self-pay | Admitting: Cardiology

## 2024-07-16 DIAGNOSIS — R42 Dizziness and giddiness: Secondary | ICD-10-CM

## 2024-07-17 ENCOUNTER — Ambulatory Visit: Admitting: Internal Medicine

## 2024-07-17 DIAGNOSIS — E039 Hypothyroidism, unspecified: Secondary | ICD-10-CM | POA: Diagnosis not present

## 2024-07-17 DIAGNOSIS — M109 Gout, unspecified: Secondary | ICD-10-CM | POA: Diagnosis not present

## 2024-07-17 DIAGNOSIS — E785 Hyperlipidemia, unspecified: Secondary | ICD-10-CM | POA: Diagnosis not present

## 2024-07-17 DIAGNOSIS — I501 Left ventricular failure: Secondary | ICD-10-CM | POA: Diagnosis not present

## 2024-07-17 DIAGNOSIS — N1831 Chronic kidney disease, stage 3a: Secondary | ICD-10-CM | POA: Diagnosis not present

## 2024-07-17 DIAGNOSIS — I130 Hypertensive heart and chronic kidney disease with heart failure and stage 1 through stage 4 chronic kidney disease, or unspecified chronic kidney disease: Secondary | ICD-10-CM | POA: Diagnosis not present

## 2024-07-17 DIAGNOSIS — N400 Enlarged prostate without lower urinary tract symptoms: Secondary | ICD-10-CM | POA: Diagnosis not present

## 2024-07-17 DIAGNOSIS — R972 Elevated prostate specific antigen [PSA]: Secondary | ICD-10-CM | POA: Diagnosis not present

## 2024-07-17 LAB — LAB REPORT - SCANNED: EGFR: 52.8

## 2024-07-17 NOTE — Progress Notes (Unsigned)
  Cardiology Office Note:   Date:  07/18/2024  ID:  Ryan [name], DOB [birth year], MRN [id number]  Primary Cardiologist: Newman JINNY Lawrence, MD Electrophysiologist: Danelle Birmingham, MD   History of Present Illness:   Ryan [name] is a 83 y.o. male with h/o chronic systolic CHF, VT, persistent AF, and ICM seen today for routine electrophysiology followup.   At last visit, device titrated from 5.0 millamp to 6.4 milliamp by increments of 0.4 with good blood pressure response. Had stim at 7.4 ma that resolved with down-titration   Since last being seen in our clinic the patient reports doing poorly. He has come off of his Entresto  for about a month because it was making him feel bad. Has lost > 10 lbs. Energy is very poor. No chest pain. SOB with more than mild exertion.   Review of systems complete and found to be negative unless listed in HPI.    EP Information / Studies Reviewed:    EKG is not ordered today. EKG from 06/02/2024 reviewed which showed sinus bradycardia at 52 bpm   Arrhythmia/Device History [employer] ICD 12/2020 for CHF  S/p Barostim (commercial) 02/23/2024   Barostim Interrogation- Performed personally and reviewed in detail today,  See scanned report  ICD/PPM interrogation - Not performed today. See last P[employer] report    Physical Exam:   VS:  BP (!) 112/58   Pulse 60   Ht 5' 5 (1.651 m)   Wt 125 lb 8 oz (56.9 kg)   SpO2 99%   BMI 20.88 kg/m    Wt Readings from Last 3 Encounters:  07/18/24 125 lb 8 oz (56.9 kg)  06/02/24 134 lb 3.2 oz (60.9 kg)  04/11/24 140 lb (63.5 kg)     GEN: Well nourished, well developed in no acute distress NECK: No JVD; No carotid bruits CARDIAC: Regular rate and rhythm, no murmurs, rubs, gallops RESPIRATORY:  Clear to auscultation without rales, wheezing or rhonchi  ABDOMEN: Soft, non-tender, non-distended EXTREMITIES:  No edema; No deformity   ASSESSMENT AND PLAN:    Chronic systolic CHF s/p Engineer, manufacturing and Barostim implantation NYHA III-IIIb symptoms.   Device programmed at 6.4 ma for chronic settings . He had recurrent stim turning up, and no real indication to go higher. Device impedence stable. Pt goals are to increase appetite and functional status.   Normal device function See scanned report.  With early satiety and weight loss, concern for worsening, but could also be related to severe AS.   Severe AS Very likely contributing to his symptoms.   H/o VT Follow.  Continue amiodarone  200 mg daily  CAD No s/s of ischemia.       Disposition:   Follow up with EP Team in in 6 months. Encouraged close HF clinic follow up in setting of med intolerance and early satiety.   Signed, Ozell Prentice Passey, PA-C

## 2024-07-18 ENCOUNTER — Encounter: Payer: Self-pay | Admitting: Student

## 2024-07-18 ENCOUNTER — Ambulatory Visit: Attending: Internal Medicine | Admitting: Internal Medicine

## 2024-07-18 ENCOUNTER — Ambulatory Visit: Attending: Student | Admitting: Student

## 2024-07-18 ENCOUNTER — Encounter: Payer: Self-pay | Admitting: Internal Medicine

## 2024-07-18 VITALS — BP 114/50 | HR 55 | Ht 65.0 in | Wt 124.0 lb

## 2024-07-18 VITALS — BP 112/58 | HR 60 | Ht 65.0 in | Wt 125.5 lb

## 2024-07-18 DIAGNOSIS — I5032 Chronic diastolic (congestive) heart failure: Secondary | ICD-10-CM | POA: Diagnosis not present

## 2024-07-18 DIAGNOSIS — I4819 Other persistent atrial fibrillation: Secondary | ICD-10-CM | POA: Diagnosis not present

## 2024-07-18 DIAGNOSIS — I25118 Atherosclerotic heart disease of native coronary artery with other forms of angina pectoris: Secondary | ICD-10-CM

## 2024-07-18 DIAGNOSIS — I255 Ischemic cardiomyopathy: Secondary | ICD-10-CM

## 2024-07-18 DIAGNOSIS — I739 Peripheral vascular disease, unspecified: Secondary | ICD-10-CM

## 2024-07-18 DIAGNOSIS — I5042 Chronic combined systolic (congestive) and diastolic (congestive) heart failure: Secondary | ICD-10-CM

## 2024-07-18 DIAGNOSIS — D6869 Other thrombophilia: Secondary | ICD-10-CM | POA: Diagnosis not present

## 2024-07-18 DIAGNOSIS — I472 Ventricular tachycardia, unspecified: Secondary | ICD-10-CM

## 2024-07-18 DIAGNOSIS — I350 Nonrheumatic aortic (valve) stenosis: Secondary | ICD-10-CM

## 2024-07-18 DIAGNOSIS — N1831 Chronic kidney disease, stage 3a: Secondary | ICD-10-CM | POA: Diagnosis not present

## 2024-07-18 NOTE — Patient Instructions (Addendum)
 Medication Instructions:  Your physician recommends that you continue on your current medications as directed. Please refer to the Current Medication list given to you today.  *If you need a refill on your cardiac medications before your next appointment, please call your pharmacy*  Lab Work: None ordered If you have labs (blood work) drawn today and your tests are completely normal, you will receive your results only by: MyChart Message (if you have MyChart) OR A paper copy in the mail If you have any lab test that is abnormal or we need to change your treatment, we will call you to review the results.  Follow-Up: At [facility], you and your health needs are our priority.  As part of our continuing mission to provide you with exceptional heart care, our providers are all part of one team.  This team includes your primary Cardiologist (physician) and Advanced Practice Providers or APPs (Physician Assistants and Nurse Practitioners) who all work together to provide you with the care you need, when you need it.  Your next appointment:   6 month(s)  Provider:   Ozell Jodie Passey, PA-C   Schedule an appointment with Dr Rolan or APP in his office.   We recommend signing up for the patient portal called MyChart.  Sign up information is provided on this After Visit Summary.  MyChart is used to connect with patients for Virtual Visits (Telemedicine).  Patients are able to view lab/test results, encounter notes, upcoming appointments, etc.  Non-urgent messages can be sent to your provider as well.   To learn more about what you can do with MyChart, go to [email or web address].

## 2024-07-18 NOTE — Progress Notes (Signed)
 Pre Surgical Assessment: 5 M Walk Test  38M=16.45ft  5 Meter Walk Test- trial 1: 7.25 seconds 5 Meter Walk Test- trial 2: 6.75 seconds 5 Meter Walk Test- trial 3: 6.57 seconds 5 Meter Walk Test Average: 6.86 seconds

## 2024-07-18 NOTE — Patient Instructions (Addendum)
 Medication Instructions:  No changes *If you need a refill on your cardiac medications before your next appointment, please call your pharmacy*  Lab Work: Will retrieve copy from PCP - labs drawn yesterday.   Testing/Procedures: Your physician has requested that you have a cardiac catheterization. Cardiac catheterization is used to diagnose and/or treat various heart conditions. Doctors may recommend this procedure for a number of different reasons. The most common reason is to evaluate chest pain. Chest pain can be a symptom of coronary artery disease (CAD), and cardiac catheterization can show whether plaque is narrowing or blocking your heart's arteries. This procedure is also used to evaluate the valves, as well as measure the blood flow and oxygen levels in different parts of your heart. For further information please visit [email or web address] Please follow instruction sheet, as given.   Follow-Up: Per Structural Heart Team  Addendum:  called PCP office, Dr. Loreli, requested labs that were drawn yesterday be faxed to [facility], attn: Bingham Millette  (CBC, bmet)  Other Instructions       Cardiac/Peripheral Catheterization   You are scheduled for a Cardiac Catheterization on Thursday, August 7 with Dr. Lurena Red.  1. Please arrive at the [facility] (Main Entrance A) at [facility]: [address] [place], [place] [address] at 5:30 AM (This time is TWO hour(s) before your procedure to ensure your preparation).   Free valet parking service is available. You will check in at ADMITTING. The support person will be asked to wait in the waiting room.  It is OK to have someone drop you off and come back when you are ready to be discharged.        Special note: Every effort is made to have your procedure done on time. Please understand that emergencies sometimes delay scheduled procedures.  2. Diet: No solid foods after midnight. You may have clear liquids until you leave for the  hospital.   3. Hydration: REGULAR: You need to be well hydrated before your procedure time. You may drink approved liquids (see below) until you leave for the hospital. On the way to the hospital, please drink a 16-oz (1 plastic bottle) of water .      (List of approved liquids water , clear juice, clear tea, black coffee, fruit juices, non-citric and without pulp, carbonated beverages, Gatorade, Kool -Aid, plain Jello-O and plain ice popsicles)  4. Labs: You will need to have blood drawn today.  You do not need to be fasting.  5. Medication instructions in preparation for your procedure:   Contrast Allergy: No  No torsemide  day of procedure.  On the morning of your procedure, take Aspirin  81 mg and any morning medicines NOT listed above.  You may use sips of water . - per Dr. Red okay to take once prior to cath.  6. Plan to go home the same day, you will only stay overnight if medically necessary. 7. You MUST have a responsible adult to drive you home. 8. An adult MUST be with you the first 24 hours after you arrive home. 9. Bring a current list of your medications, and the last time and date medication taken. 10. Bring ID and current insurance cards. 11.Please wear clothes that are easy to get on and off and wear slip-on shoes.  Thank you for allowing us  to care for you!   -- [facility]

## 2024-07-19 ENCOUNTER — Telehealth: Payer: Self-pay | Admitting: *Deleted

## 2024-07-19 NOTE — Telephone Encounter (Signed)
 I will miss seeing him, but whatever the patient decides is okay with me.  Thanks MJP

## 2024-07-19 NOTE — Telephone Encounter (Signed)
 Patient has requested to switch cardiologists from Dr. Elmira to Dr. Wendel.  He is aware if both doctors are in agreement we can make the change.  Pt grateful for assistance.

## 2024-07-20 DIAGNOSIS — R82998 Other abnormal findings in urine: Secondary | ICD-10-CM | POA: Diagnosis not present

## 2024-07-20 DIAGNOSIS — R7989 Other specified abnormal findings of blood chemistry: Secondary | ICD-10-CM | POA: Diagnosis not present

## 2024-07-20 DIAGNOSIS — I130 Hypertensive heart and chronic kidney disease with heart failure and stage 1 through stage 4 chronic kidney disease, or unspecified chronic kidney disease: Secondary | ICD-10-CM | POA: Diagnosis not present

## 2024-07-20 DIAGNOSIS — N35919 Unspecified urethral stricture, male, unspecified site: Secondary | ICD-10-CM | POA: Diagnosis not present

## 2024-07-20 DIAGNOSIS — N320 Bladder-neck obstruction: Secondary | ICD-10-CM | POA: Diagnosis not present

## 2024-07-20 DIAGNOSIS — R634 Abnormal weight loss: Secondary | ICD-10-CM | POA: Diagnosis not present

## 2024-07-20 DIAGNOSIS — Z5181 Encounter for therapeutic drug level monitoring: Secondary | ICD-10-CM | POA: Diagnosis not present

## 2024-07-20 DIAGNOSIS — N1831 Chronic kidney disease, stage 3a: Secondary | ICD-10-CM | POA: Diagnosis not present

## 2024-07-20 DIAGNOSIS — E039 Hypothyroidism, unspecified: Secondary | ICD-10-CM | POA: Diagnosis not present

## 2024-07-20 DIAGNOSIS — Z0000 Encounter for general adult medical examination without abnormal findings: Secondary | ICD-10-CM | POA: Diagnosis not present

## 2024-07-20 DIAGNOSIS — R7301 Impaired fasting glucose: Secondary | ICD-10-CM | POA: Diagnosis not present

## 2024-07-20 DIAGNOSIS — G720 Drug-induced myopathy: Secondary | ICD-10-CM | POA: Diagnosis not present

## 2024-07-20 DIAGNOSIS — I501 Left ventricular failure: Secondary | ICD-10-CM | POA: Diagnosis not present

## 2024-07-20 DIAGNOSIS — R339 Retention of urine, unspecified: Secondary | ICD-10-CM | POA: Diagnosis not present

## 2024-07-20 DIAGNOSIS — E785 Hyperlipidemia, unspecified: Secondary | ICD-10-CM | POA: Diagnosis not present

## 2024-07-20 DIAGNOSIS — I4729 Other ventricular tachycardia: Secondary | ICD-10-CM | POA: Diagnosis not present

## 2024-07-20 DIAGNOSIS — I6529 Occlusion and stenosis of unspecified carotid artery: Secondary | ICD-10-CM | POA: Diagnosis not present

## 2024-07-20 DIAGNOSIS — I10 Essential (primary) hypertension: Secondary | ICD-10-CM | POA: Diagnosis not present

## 2024-07-20 DIAGNOSIS — I350 Nonrheumatic aortic (valve) stenosis: Secondary | ICD-10-CM | POA: Diagnosis not present

## 2024-07-20 DIAGNOSIS — Z1339 Encounter for screening examination for other mental health and behavioral disorders: Secondary | ICD-10-CM | POA: Diagnosis not present

## 2024-07-20 DIAGNOSIS — Z1331 Encounter for screening for depression: Secondary | ICD-10-CM | POA: Diagnosis not present

## 2024-07-20 DIAGNOSIS — I480 Paroxysmal atrial fibrillation: Secondary | ICD-10-CM | POA: Diagnosis not present

## 2024-07-20 LAB — LAB REPORT - SCANNED
A1c: 5.5
Albumin, Urine POC: 33
Creatinine, POC: 89.8 mg/dL
EGFR: 52.8
Free T4: 0.88 ng/dL
Microalb Creat Ratio: 37

## 2024-07-21 NOTE — Telephone Encounter (Signed)
 Thukkani, Arun K, MD to Me  Elmira Newman PARAS, MD (Selected Message) AT    07/19/24  9:43 PM As long as Dr. SHAUNNA is ok with it, then it is fine.  Thank you. _________________________________________    Updated general cardiologist in chart

## 2024-07-24 ENCOUNTER — Telehealth (HOSPITAL_COMMUNITY): Payer: Self-pay

## 2024-07-24 ENCOUNTER — Other Ambulatory Visit (HOSPITAL_COMMUNITY): Payer: Self-pay | Admitting: Family Medicine

## 2024-07-24 ENCOUNTER — Ambulatory Visit: Payer: Self-pay | Admitting: Internal Medicine

## 2024-07-24 NOTE — Telephone Encounter (Signed)
 Called to confirm/remind patient of their appointment at the [facility] on 07/25/24.   Appointment:   [x] Confirmed  [] Left mess   [] No answer/No voice mail  [] VM Full/unable to leave message  [] Phone not in service  Patient reminded to bring all medications and/or complete list.  Confirmed patient has transportation. Gave directions, instructed to utilize valet parking.

## 2024-07-25 ENCOUNTER — Encounter (HOSPITAL_COMMUNITY): Payer: Self-pay

## 2024-07-25 ENCOUNTER — Telehealth: Payer: Self-pay | Admitting: Internal Medicine

## 2024-07-25 ENCOUNTER — Ambulatory Visit (HOSPITAL_COMMUNITY)
Admission: RE | Admit: 2024-07-25 | Discharge: 2024-07-25 | Disposition: A | Discharge status: Home / Self Care | Source: Ambulatory Visit | Attending: Cardiology | Admitting: Cardiology

## 2024-07-25 DIAGNOSIS — Z7901 Long term (current) use of anticoagulants: Secondary | ICD-10-CM | POA: Diagnosis not present

## 2024-07-25 DIAGNOSIS — R630 Anorexia: Secondary | ICD-10-CM | POA: Diagnosis not present

## 2024-07-25 DIAGNOSIS — I5022 Chronic systolic (congestive) heart failure: Secondary | ICD-10-CM | POA: Diagnosis not present

## 2024-07-25 DIAGNOSIS — I4819 Other persistent atrial fibrillation: Secondary | ICD-10-CM | POA: Diagnosis not present

## 2024-07-25 DIAGNOSIS — I5042 Chronic combined systolic (congestive) and diastolic (congestive) heart failure: Secondary | ICD-10-CM

## 2024-07-25 DIAGNOSIS — I25810 Atherosclerosis of coronary artery bypass graft(s) without angina pectoris: Secondary | ICD-10-CM | POA: Diagnosis not present

## 2024-07-25 DIAGNOSIS — R001 Bradycardia, unspecified: Secondary | ICD-10-CM | POA: Diagnosis not present

## 2024-07-25 DIAGNOSIS — Z951 Presence of aortocoronary bypass graft: Secondary | ICD-10-CM | POA: Diagnosis not present

## 2024-07-25 DIAGNOSIS — I255 Ischemic cardiomyopathy: Secondary | ICD-10-CM | POA: Diagnosis not present

## 2024-07-25 DIAGNOSIS — Z5181 Encounter for therapeutic drug level monitoring: Secondary | ICD-10-CM

## 2024-07-25 DIAGNOSIS — I739 Peripheral vascular disease, unspecified: Secondary | ICD-10-CM | POA: Diagnosis not present

## 2024-07-25 DIAGNOSIS — I5020 Unspecified systolic (congestive) heart failure: Secondary | ICD-10-CM

## 2024-07-25 DIAGNOSIS — N183 Chronic kidney disease, stage 3 unspecified: Secondary | ICD-10-CM | POA: Diagnosis not present

## 2024-07-25 DIAGNOSIS — Z95810 Presence of automatic (implantable) cardiac defibrillator: Secondary | ICD-10-CM | POA: Diagnosis not present

## 2024-07-25 DIAGNOSIS — R112 Nausea with vomiting, unspecified: Secondary | ICD-10-CM | POA: Insufficient documentation

## 2024-07-25 DIAGNOSIS — I2510 Atherosclerotic heart disease of native coronary artery without angina pectoris: Secondary | ICD-10-CM | POA: Diagnosis not present

## 2024-07-25 DIAGNOSIS — I4891 Unspecified atrial fibrillation: Secondary | ICD-10-CM | POA: Insufficient documentation

## 2024-07-25 DIAGNOSIS — I340 Nonrheumatic mitral (valve) insufficiency: Secondary | ICD-10-CM | POA: Diagnosis not present

## 2024-07-25 DIAGNOSIS — R5383 Other fatigue: Secondary | ICD-10-CM | POA: Insufficient documentation

## 2024-07-25 DIAGNOSIS — I350 Nonrheumatic aortic (valve) stenosis: Secondary | ICD-10-CM | POA: Insufficient documentation

## 2024-07-25 DIAGNOSIS — Z79899 Other long term (current) drug therapy: Secondary | ICD-10-CM | POA: Diagnosis not present

## 2024-07-25 MED ORDER — AMIODARONE HCL 100 MG PO TABS
100.0000 mg | ORAL_TABLET | Freq: Every day | ORAL | 3 refills | Status: DC
Start: 2024-07-25 — End: 2024-09-06

## 2024-07-25 NOTE — Progress Notes (Signed)
 ADVANCED HF CLINIC NOTE  PCP: [name], MD Cardiology: Dr. Wendel HF Cardiology: Dr. Rolan  HPI: 83 y.o. with history of CAD s/p CABG, ischemic cardiomyopathy, permanent atrial fibrillation, PAD, and CKD stage 3 was referred by Dr. Elmira for evaluation of CHF.  Patient is s/p CABG, last cath in 5/23 showed occluded native vessels with patent LIMA-LAD (occluded SVG-ramus and SVG-PDA), no interventional target.  Last echo in 8/24 showed EF 25-30%, moderate LVH, severe LV dilation, mild aortic stenosis, mild MR.  Patient has a Environmental manager ICD.  He has had atrial fibrillation that appears to be permanent; all ECGs since 2021 show atrial fibrillation.  He has PAD and is s/p aortofemoral bypass.  He has had VT episodes, the last was on 11/08/23.    RHC in 12/24 showed normal filling pressures, CI 2.55 by Fick.      1/25: Patient returns for followup of CHF.  He has been doing better on higher torsemide  dose.  HeartLogic down to 0.  He tires easily.  No orthopnea/PND.  Rare lightheadedness if he stands too fast.  BP has been stable.  He did not tolerate Farxiga  due to nausea. No dyspnea walking around the house, gets short of breath walking longer distances especially in the cold. No chest pain. Weight down 4 lbs.   S/p Barostim placement 02/23/24.  Seen by Dr. Wendel 07/18/24, has been feeling poorly with worsening dyspnea and pre-syncope. Echo showed LF/LG AS. Work up started for [employer]. He is scheduled for [facility] 07/27/24 with Dr. Wendel. CMR scheduled 08/07/24.  He returns today for heart failure follow up with wife. Overall feeling unwell. NYHA IIIb. Able to walk 400 ft to end of driveway and back, however just takes his time. Has been having nausea and vomiting with intermittent dizziness when he stands up too quickly. Says he just fills ill. No fevers or chills. Very forgetful throughout ROS. R/LHC explained at length today. Plan for Thursday with Dr. Wendel for AS  work up.  Reports fatigue. Denies chest pain, dyspnea, lower extremity edema, near-syncope, and palpitations. Able to perform ADLs. Appetite poor. Compliant with all medications.  PMH: 1. PAD: H/o aortobifemoral bypass 2. CVA 3. CKD stage 3 4. Gout 5. Hypothyroidism 6. Hyperlipidemia 7. VT: Amiodarone  use. 8. Atrial fibrillation: Permanent, in atrial fibrillation since 2021.  9. CAD: S/p CABG.  - LHC (5/23): 99% prox/mid LCx (subtotal occlusion), occluded mid LAD, 95% D1, occluded RCA, occluded SVG-PDA, occluded SVG-ramus, patent LIMA-LAD with collaterals from LAD to RCA and LCx territories.  10. Chronic systolic CHF: Ischemic cardiomyopathy: [employer] ICD.  - Echo (8/24) with EF 25-30%, moderate LVH, severe LV dilation, mild aortic stenosis, mild MR.  11. Aortic stenosis: Mild on 8/24 echo.   SH: Married, no smoking/ETOH.  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   Family History  Problem Relation Age of Onset   Heart disease Father    Heart attack Father    Heart disease Sister    Hypertension Sister    Heart attack Sister    Hypertension Mother    Diabetes Son    ROS: All systems reviewed and negative except as per HPI.   Current Outpatient Medications  Medication Sig Dispense Refill   ALPRAZolam  (XANAX ) 0.5 MG tablet Take 0.5 mg by mouth 3 (three) times daily as needed for anxiety or sleep.     apixaban  (ELIQUIS ) 5 MG TABS tablet Take 5 mg by mouth 2 (two) times  daily.     bisacodyl  (DULCOLAX) 5 MG EC tablet Take 10 mg by mouth at bedtime.     colchicine  0.6 MG tablet Take 1 tablet (0.6 mg total) by mouth 2 (two) times daily. (Patient taking differently: Take 0.6 mg by mouth 2 (two) times daily as needed (gout pain).) 60 tablet 3   digoxin  (LANOXIN ) 0.125 MG tablet Take 0.5 tablets (0.0625 mg total) by mouth every other day.     Evolocumab  (REPATHA  SURECLICK) 140 MG/ML SOAJ Inject 140 mg into the skin every 14 (fourteen) days. 2 mL 0   isosorbide   mononitrate (IMDUR ) 30 MG 24 hr tablet Take 1 tablet (30 mg total) by mouth daily. 90 tablet 3   levothyroxine  (SYNTHROID ) 50 MCG tablet Take 1 tablet (50 mcg total) by mouth daily before breakfast. PLEASE SCHEDULE APPOINTMENT FOR MORE REFILLS 90 tablet 0   meclizine  (ANTIVERT ) 12.5 MG tablet TAKE 2 TABLETS BY MOUTH 3 TIMES A DAY AS NEEDED **FURTHER REFILLS NEED TO BE PRESCRIBED BY PRIMARY CARE PHYSICIAN** 180 tablet 0   methocarbamol  (ROBAXIN ) 500 MG tablet Take 500 mg by mouth daily as needed for muscle spasms (back pain.).     nitroGLYCERIN  (NITROSTAT ) 0.4 MG SL tablet Place 1 tablet (0.4 mg total) under the tongue every 5 (five) minutes as needed for chest pain. 25 tablet 3   ondansetron  (ZOFRAN ) 4 MG tablet Take 4 mg by mouth every 8 (eight) hours as needed for nausea or vomiting.     oxyCODONE  (ROXICODONE ) 5 MG immediate release tablet Take 1 tablet (5 mg total) by mouth every 6 (six) hours as needed. 8 tablet 0   pantoprazole  (PROTONIX ) 40 MG tablet Take 1 tablet (40 mg total) by mouth daily. (Patient taking differently: Take 40 mg by mouth daily as needed (acid reflux).) 30 tablet 11   polyethylene glycol powder (GLYCOLAX /MIRALAX ) 17 GM/SCOOP powder Take 17 g by mouth at bedtime.     sacubitril -valsartan  (ENTRESTO ) 24-26 MG Take 1 tablet by mouth 2 (two) times daily. 60 tablet 11   tobramycin  (TOBREX ) 0.3 % ophthalmic solution Place 1 drop into the left eye daily as needed (Dry eyes).     torsemide  (DEMADEX ) 20 MG tablet Take 40 mg by mouth daily.     amiodarone  (PACERONE ) 100 MG tablet Take 1 tablet (100 mg total) by mouth daily. 30 tablet 3   potassium chloride  (KLOR-CON  M) 10 MEQ tablet TAKE 1 TABLET BY MOUTH DAILY 30 tablet 6   No current facility-administered medications for this encounter.   BP 120/62 (Cuff Size: Normal)   Pulse (!) 45   Wt 56.1 kg (123 lb 9.6 oz)   SpO2 100%   BMI 20.57 kg/m   Physical Exam: General: Elderly appearing. No distress on RA Cardiac: JVP flat. S1  and S2 present. No murmurs or rub. Extremities: Warm and dry.  No peripheral edema.  Neuro: Alert and oriented x3. Affect pleasant.   Geographical information systems officer (personally reviewed): HeartLogic 3  Assessment/Plan: 1. Chronic systolic CHF: Ischemic cardiomyopathy.  [employer] ICD.  Echo in 8/24 showed EF 25-30%, moderate LVH, severe LV dilation, mild aortic stenosis, mild MR.  RHC in 12/24 showed normal filling pressures, CI 2.55. Echo 4/25 with hypertrophy and reduced mitral tissue, concerning for amyloidosis, EF 50-55%, mildly reduced RV. CMR scheduled for 8/18.  - S/P BaroStim placement with Dr. Serene - He is minimally volume overloaded by exam today and not by HeartLogic (1).   - NYHA class III. Appears euvolemic -  Continue torsemide  40 mg daily + 10 mEq KCL - He failed both Jardiance  and Farxiga  due to nausea. - Restart entresto  24/26 mg bid, had stopped but he is not sure why - Stop bisoprolol  with bradycardia  2. LFLG Aortic Stenosis - Echo 4/25 with LFLG AS with AVA 0.75cm2, DI 0.24, Vmax 2.1 m/s - Seen by Dr. Wendel. AoV work up started. Scheduled for [facility] 8/11 for valve.    3. CAD: s/p CABG.  Cath in 5/23 without interventional option.  Severe disease of native vessels, occluded SVG-ramus and SVG-PDA, patent LIMA-LAD. No chest pain.  - No ASA given Eliquis  use.  - Continue Repatha .   4. Atrial fibrillation: Present since 2021. Prev thought to be permanent, however ECGs from 2/25 and 7/25 showed SB with 1AVB.  - Continue apixaban , dosed at 2.5 bid for age > 80, creatinine usually > 1.5.   5. CKD stage 3: BMET today.  - Scan labs reviewed from 07/20/24: Cr 1.3, K 4.7  6. VT: Last episode in 11/24 terminated by ATP.   - Decrease amio to 100 mg daily d/t bradycardia.    7. PAD: H/o aortobifemoral bypass.  He denies claudication.    Follow up in 1 month with APP  [place] Keondrick Dilks, NP 07/25/2024

## 2024-07-25 NOTE — Telephone Encounter (Signed)
 Left message with call back number stating that there is not a later time available for Heart Cath that day.

## 2024-07-25 NOTE — Patient Instructions (Signed)
 Medication Changes:  STOP BISOPROLOL    DECREASE AMIODARONE  TO 100MG  ONCE DAILY---THIS HAS BEEN SENT TO YOUR PHARMACY   RESTART ENTRESTO  ONE TABLET TWICE DAILY   Follow-Up in: 1 MONTH AS SCHEDULED   At the [facility], you and your health needs are our priority. We have a designated team specialized in the treatment of Heart Failure. This Care Team includes your primary Heart Failure Specialized Cardiologist (physician), Advanced Practice Providers (APPs- Physician Assistants and Nurse Practitioners), and Pharmacist who all work together to provide you with the care you need, when you need it.   You may see any of the following providers on your designated Care Team at your next follow up:  Dr. Toribio Fuel Dr. Ezra Shuck Dr. Ria Commander Dr. Odis Brownie Greig Mosses, NP Caffie Shed, [place] [facility] [place], [place] Beckey Coe, NP [place] Lee, NP Tinnie Redman, PharmD   Please be sure to bring in all your medications bottles to every appointment.   Need to Contact Us :  If you have any questions or concerns before your next appointment please send us  a message through [place] or call our office at [phone].    TO LEAVE A MESSAGE FOR THE NURSE SELECT OPTION 2, PLEASE LEAVE A MESSAGE INCLUDING: YOUR NAME DATE OF BIRTH CALL BACK NUMBER REASON FOR CALL**this is important as we prioritize the call backs  YOU WILL RECEIVE A CALL BACK THE SAME DAY AS LONG AS YOU CALL BEFORE 4:00 PM

## 2024-07-25 NOTE — Telephone Encounter (Signed)
  Wife is calling asking if Ryan [name] cath procedure can be a little later so that he doesn't have to be at the hospital at 5:30 am. Please advise.

## 2024-07-26 ENCOUNTER — Telehealth: Payer: Self-pay | Admitting: *Deleted

## 2024-07-26 NOTE — Telephone Encounter (Addendum)
 Cardiac Catheterization scheduled at [facility] for: Thursday July 27, 2024 7:30 AM Arrival time [facility] Main Entrance A at: 5:30 AM  Diet: -Nothing to eat after midnight prior to procedure.  Hydration: -May drink clear liquids until leaving for hospital. Approved liquids: Water , clear tea, black coffee, fruit juices-non-citric and without pulp,Gatorade, plain Jello/popsicles.  Drink  8 oz. bottle of water  on the way to the hospital.   Medication instructions: -Hold:  Eliquis -pt reports last dose 07/25/24 about 6 PM-knows to hold until after procedure 07/27/24  Torsemide /KCl/Entresto -day before and day of procedure- per protocol GFR < 60 (52) -Other usual morning medications can be taken including aspirin  81 mg-pt tells me he can take aspirin  81 mg  Plan to go home the same day, you will only stay overnight if medically necessary.  You must have responsible adult to drive you home.  Someone must be with you the first 24 hours after you arrive home.

## 2024-07-27 ENCOUNTER — Ambulatory Visit (HOSPITAL_COMMUNITY)
Admission: RE | Admit: 2024-07-27 | Discharge: 2024-07-27 | Disposition: A | Discharge status: Home / Self Care | Attending: Internal Medicine | Admitting: Internal Medicine

## 2024-07-27 ENCOUNTER — Encounter (HOSPITAL_COMMUNITY): Payer: Self-pay | Admitting: Internal Medicine

## 2024-07-27 ENCOUNTER — Other Ambulatory Visit: Payer: Self-pay

## 2024-07-27 ENCOUNTER — Encounter (HOSPITAL_COMMUNITY)
Admission: RE | Disposition: A | Discharge status: Home / Self Care | Payer: Self-pay | Source: Home / Self Care | Attending: Internal Medicine

## 2024-07-27 DIAGNOSIS — I350 Nonrheumatic aortic (valve) stenosis: Secondary | ICD-10-CM

## 2024-07-27 DIAGNOSIS — R634 Abnormal weight loss: Secondary | ICD-10-CM | POA: Diagnosis not present

## 2024-07-27 DIAGNOSIS — I4819 Other persistent atrial fibrillation: Secondary | ICD-10-CM | POA: Insufficient documentation

## 2024-07-27 DIAGNOSIS — I2510 Atherosclerotic heart disease of native coronary artery without angina pectoris: Secondary | ICD-10-CM | POA: Diagnosis not present

## 2024-07-27 DIAGNOSIS — D6869 Other thrombophilia: Secondary | ICD-10-CM | POA: Insufficient documentation

## 2024-07-27 DIAGNOSIS — Z79899 Other long term (current) drug therapy: Secondary | ICD-10-CM | POA: Diagnosis not present

## 2024-07-27 DIAGNOSIS — I255 Ischemic cardiomyopathy: Secondary | ICD-10-CM | POA: Diagnosis not present

## 2024-07-27 DIAGNOSIS — I130 Hypertensive heart and chronic kidney disease with heart failure and stage 1 through stage 4 chronic kidney disease, or unspecified chronic kidney disease: Secondary | ICD-10-CM | POA: Insufficient documentation

## 2024-07-27 DIAGNOSIS — Z87891 Personal history of nicotine dependence: Secondary | ICD-10-CM | POA: Diagnosis not present

## 2024-07-27 DIAGNOSIS — I472 Ventricular tachycardia, unspecified: Secondary | ICD-10-CM | POA: Diagnosis not present

## 2024-07-27 DIAGNOSIS — I739 Peripheral vascular disease, unspecified: Secondary | ICD-10-CM | POA: Insufficient documentation

## 2024-07-27 DIAGNOSIS — N1831 Chronic kidney disease, stage 3a: Secondary | ICD-10-CM | POA: Diagnosis not present

## 2024-07-27 DIAGNOSIS — Z7901 Long term (current) use of anticoagulants: Secondary | ICD-10-CM | POA: Diagnosis not present

## 2024-07-27 HISTORY — PX: RIGHT/LEFT HEART CATH AND CORONARY/GRAFT ANGIOGRAPHY: CATH118267

## 2024-07-27 LAB — POCT I-STAT EG7
Acid-base deficit: 1 mmol/L (ref 0.0–2.0)
Acid-base deficit: 2 mmol/L (ref 0.0–2.0)
Bicarbonate: 24 mmol/L (ref 20.0–28.0)
Bicarbonate: 25 mmol/L (ref 20.0–28.0)
Calcium, Ion: 1.1 mmol/L — ABNORMAL LOW (ref 1.15–1.40)
Calcium, Ion: 1.2 mmol/L (ref 1.15–1.40)
HCT: 36 % — ABNORMAL LOW (ref 39.0–52.0)
HCT: 36 % — ABNORMAL LOW (ref 39.0–52.0)
Hemoglobin: 12.2 g/dL — ABNORMAL LOW (ref 13.0–17.0)
Hemoglobin: 12.2 g/dL — ABNORMAL LOW (ref 13.0–17.0)
O2 Saturation: 59 %
O2 Saturation: 67 %
Potassium: 3.9 mmol/L (ref 3.5–5.1)
Potassium: 4.2 mmol/L (ref 3.5–5.1)
Sodium: 138 mmol/L (ref 135–145)
Sodium: 139 mmol/L (ref 135–145)
TCO2: 25 mmol/L (ref 22–32)
TCO2: 26 mmol/L (ref 22–32)
pCO2, Ven: 42.9 mmHg — ABNORMAL LOW (ref 44–60)
pCO2, Ven: 45.2 mmHg (ref 44–60)
pH, Ven: 7.351 (ref 7.25–7.43)
pH, Ven: 7.356 (ref 7.25–7.43)
pO2, Ven: 32 mmHg (ref 32–45)
pO2, Ven: 37 mmHg (ref 32–45)

## 2024-07-27 LAB — POCT I-STAT 7, (LYTES, BLD GAS, ICA,H+H)
Acid-base deficit: 1 mmol/L (ref 0.0–2.0)
Bicarbonate: 23.4 mmol/L (ref 20.0–28.0)
Calcium, Ion: 1.11 mmol/L — ABNORMAL LOW (ref 1.15–1.40)
HCT: 35 % — ABNORMAL LOW (ref 39.0–52.0)
Hemoglobin: 11.9 g/dL — ABNORMAL LOW (ref 13.0–17.0)
O2 Saturation: 97 %
Potassium: 4 mmol/L (ref 3.5–5.1)
Sodium: 141 mmol/L (ref 135–145)
TCO2: 25 mmol/L (ref 22–32)
pCO2 arterial: 37 mmHg (ref 32–48)
pH, Arterial: 7.41 (ref 7.35–7.45)
pO2, Arterial: 85 mmHg (ref 83–108)

## 2024-07-27 SURGERY — RIGHT/LEFT HEART CATH AND CORONARY/GRAFT ANGIOGRAPHY
Anesthesia: LOCAL

## 2024-07-27 MED ORDER — VERAPAMIL HCL 2.5 MG/ML IV SOLN
INTRAVENOUS | Status: DC | PRN
  Administered 2024-07-27: 10 mL via INTRA_ARTERIAL

## 2024-07-27 MED ORDER — HYDRALAZINE HCL 20 MG/ML IJ SOLN
10.0000 mg | INTRAMUSCULAR | Status: DC | PRN
  Administered 2024-07-27: 10 mg via INTRAVENOUS
  Filled 2024-07-27: qty 1

## 2024-07-27 MED ORDER — FREE WATER: 250.0000 mL | Freq: Once | Status: DC

## 2024-07-27 MED ORDER — FENTANYL CITRATE (PF) 100 MCG/2ML IJ SOLN
INTRAMUSCULAR | Status: AC
  Filled 2024-07-27: qty 2

## 2024-07-27 MED ORDER — VERAPAMIL HCL 2.5 MG/ML IV SOLN
INTRAVENOUS | Status: AC
  Filled 2024-07-27: qty 2

## 2024-07-27 MED ORDER — FENTANYL CITRATE (PF) 100 MCG/2ML IJ SOLN
INTRAMUSCULAR | Status: DC | PRN
  Administered 2024-07-27: 25 ug via INTRAVENOUS

## 2024-07-27 MED ORDER — SODIUM CHLORIDE 0.9% FLUSH: 3.0000 mL | Freq: Two times a day (BID) | INTRAVENOUS | Status: DC

## 2024-07-27 MED ORDER — LABETALOL HCL 5 MG/ML IV SOLN: 10.0000 mg | INTRAVENOUS | Status: DC | PRN

## 2024-07-27 MED ORDER — IOHEXOL 350 MG/ML SOLN
INTRAVENOUS | Status: DC | PRN
  Administered 2024-07-27: 45 mL

## 2024-07-27 MED ORDER — SODIUM CHLORIDE 0.9 % IV SOLN: 250.0000 mL | INTRAVENOUS | Status: DC | PRN

## 2024-07-27 MED ORDER — LIDOCAINE HCL (PF) 1 % IJ SOLN
INTRAMUSCULAR | Status: DC | PRN
  Administered 2024-07-27 (×2): 2 mL

## 2024-07-27 MED ORDER — MIDAZOLAM HCL 2 MG/2ML IJ SOLN
INTRAMUSCULAR | Status: DC | PRN
  Administered 2024-07-27: 1 mg via INTRAVENOUS

## 2024-07-27 MED ORDER — SODIUM CHLORIDE 0.9% FLUSH: 3.0000 mL | INTRAVENOUS | Status: DC | PRN

## 2024-07-27 MED ORDER — MIDAZOLAM HCL 2 MG/2ML IJ SOLN
INTRAMUSCULAR | Status: AC
  Filled 2024-07-27: qty 2

## 2024-07-27 MED ORDER — SODIUM CHLORIDE 0.9% FLUSH
3.0000 mL | INTRAVENOUS | Status: DC | PRN
Start: 2024-07-27 — End: 2024-07-27

## 2024-07-27 MED ORDER — FREE WATER: 500.0000 mL | Freq: Once | Status: DC

## 2024-07-27 MED ORDER — ASPIRIN 81 MG PO CHEW: 81.0000 mg | CHEWABLE_TABLET | ORAL | Status: AC

## 2024-07-27 MED ORDER — HEPARIN SODIUM (PORCINE) 1000 UNIT/ML IJ SOLN
INTRAMUSCULAR | Status: AC
  Filled 2024-07-27: qty 10

## 2024-07-27 MED ORDER — LIDOCAINE HCL (PF) 1 % IJ SOLN
INTRAMUSCULAR | Status: AC
  Filled 2024-07-27: qty 30

## 2024-07-27 MED ORDER — HEPARIN SODIUM (PORCINE) 1000 UNIT/ML IJ SOLN
INTRAMUSCULAR | Status: DC | PRN
  Administered 2024-07-27: 5000 [IU] via INTRAVENOUS

## 2024-07-27 MED ORDER — ACETAMINOPHEN 325 MG PO TABS: 650.0000 mg | ORAL_TABLET | ORAL | Status: DC | PRN

## 2024-07-27 MED ORDER — HEPARIN (PORCINE) IN NACL 1000-0.9 UT/500ML-% IV SOLN
INTRAVENOUS | Status: DC | PRN
  Administered 2024-07-27 (×2): 500 mL

## 2024-07-27 MED ORDER — SODIUM CHLORIDE 0.9 % IV SOLN
250.0000 mL | INTRAVENOUS | Status: DC | PRN
Start: 2024-07-27 — End: 2024-07-27

## 2024-07-27 SURGICAL SUPPLY — 12 items
CATH BALLN WEDGE 5F 110CM (CATHETERS) IMPLANT
CATH INFINITI 5FR MULTPACK ANG (CATHETERS) IMPLANT
CATH LAUNCHER 5F EBU3.5 (CATHETERS) IMPLANT
DEVICE RAD COMP TR BAND LRG (VASCULAR PRODUCTS) IMPLANT
GLIDESHEATH SLEND SS 6F .021 (SHEATH) IMPLANT
GUIDEWIRE .025 260CM (WIRE) IMPLANT
PACK CARDIAC CATHETERIZATION (CUSTOM PROCEDURE TRAY) ×1 IMPLANT
SET ATX-X65L (MISCELLANEOUS) IMPLANT
SHEATH GLIDE SLENDER 4/5FR (SHEATH) IMPLANT
SHEATH PROBE COVER 6X72 (BAG) IMPLANT
WIRE EMERALD 3MM-J .035X260CM (WIRE) IMPLANT
WIRE MICROINTRODUCER 60CM (WIRE) IMPLANT

## 2024-07-27 NOTE — Discharge Instructions (Addendum)
 Restart Eliquis  on 8/8 Radial Site Care The following information offers guidance on how to care for yourself after your procedure. Your health care provider may also give you more specific instructions. If you have problems or questions, contact your health care provider. What can I expect after the procedure? After the procedure, it is common to have bruising and tenderness in the incision area. Follow these instructions at home: Incision site care  Follow instructions from your health care provider about how to take care of your incision site. Make sure you: Wash your hands with soap and water  for at least 20 seconds before and after you change your bandage (dressing). If soap and water  are not available, use hand sanitizer. Remove your dressing in 24 hours. Leave stitches (sutures), skin glue, or adhesive strips in place. These skin closures may need to stay in place for 2 weeks or longer. If adhesive strip edges start to loosen and curl up, you may trim the loose edges. Do not remove adhesive strips completely unless your health care provider tells you to do that. Do not take baths, swim, or use a hot tub for at least 1 week. You may shower 24 hours after the procedure or as told by your health care provider. Remove the dressing and gently wash the incision area with plain soap and water . Pat the area dry with a clean towel. Do not rub the site. That could cause bleeding. Do not apply powder or lotion to the site. Check your incision site every day for signs of infection. Check for: Redness, swelling, or pain. Fluid or blood. Warmth. Pus or a bad smell. Activity For 24 hours after the procedure, or as directed by your health care provider: Do not flex or bend the affected arm. Do not push or pull heavy objects with the affected arm. Do not operate machinery or power tools. Do not drive. You should not drive yourself home from the hospital or clinic if you go home during that time  period. You may drive 24 hours after the procedure unless your health care provider tells you not to. Do not lift anything that is heavier than 10 lb (4.5 kg), or the limit that you are told, until your health care provider says that it is safe. Return to your normal activities as told by your health care provider. Ask your health care provider what activities are safe for you and when you can return to work. If you were given a sedative during the procedure, it can affect you for several hours. Do not drive or operate machinery until your health care provider says that it is safe. General instructions Take over-the-counter and prescription medicines only as told by your health care provider. If you will be going home right after the procedure, plan to have a responsible adult care for you for the time you are told. This is important. Keep all follow-up visits. This is important. Contact a health care provider if: You have a fever or chills. You have any of these signs of infection at your incision site: Redness, swelling, or pain. Fluid or blood. Warmth. Pus or a bad smell. Get help right away if: The incision area swells very fast. The incision area is bleeding, and the bleeding does not stop when you hold steady pressure on the area. Your arm or hand becomes pale, cool, tingly, or numb. These symptoms may represent a serious problem that is an emergency. Do not wait to see if the symptoms will  go away. Get medical help right away. Call your local emergency services (911 in the [place] Do not drive yourself to the hospital. Summary After the procedure, it is common to have bruising and tenderness at the incision site. Follow instructions from your health care provider about how to take care of your radial site incision. Check the incision every day for signs of infection. Do not lift anything that is heavier than 10 lb (4.5 kg), or the limit that you are told, until your health care provider  says that it is safe. Get help right away if the incision area swells very fast, you have bleeding at the incision site that will not stop, or your arm or hand becomes pale, cool, or numb. This information is not intended to replace advice given to you by your health care provider. Make sure you discuss any questions you have with your health care provider. Document Revised: 01/26/2021 Document Reviewed: 01/26/2021 Elsevier Patient Education  2024 [employer].

## 2024-07-27 NOTE — Interval H&P Note (Signed)
 History and Physical Interval Note:  07/27/2024 6:34 AM  Ryan [name]  has presented today for surgery, with the diagnosis of aortic stenosis.  The various methods of treatment have been discussed with the patient and family. After consideration of risks, benefits and other options for treatment, the patient has consented to  Procedure(s): RIGHT/LEFT HEART CATH AND CORONARY/GRAFT ANGIOGRAPHY (N/A) as a surgical intervention.  The patient's history has been reviewed, patient examined, no change in status, stable for surgery.  I have reviewed the patient's chart and labs.  Questions were answered to the patient's satisfaction.     Ryan [name] Ryan [name]

## 2024-07-27 NOTE — Addendum Note (Signed)
 Encounter addended by: Buell Powell HERO, RN on: 07/27/2024 2:51 PM  Actions taken: Visit diagnoses modified, Diagnosis association updated

## 2024-08-02 NOTE — Progress Notes (Signed)
 Procedure Type: Isolated AVR Perioperative Outcome Estimate % Operative Mortality 10.1% Morbidity & Mortality 17.6% Stroke 5.09% Renal Failure 5.29% Reoperation 4.7% Prolonged Ventilation 10% Deep Sternal Wound Infection 0.13% Long Hospital Stay (>14 days) 13.8% Short Hospital Stay (<6 days)* 12.4%

## 2024-08-04 ENCOUNTER — Telehealth: Payer: Self-pay | Admitting: Internal Medicine

## 2024-08-04 ENCOUNTER — Telehealth: Payer: Self-pay

## 2024-08-04 NOTE — Telephone Encounter (Signed)
 Spoke to patient's wife.She stated on patient's instructions it says to hold erectile dysfunction medications.After reviewing patient's medications advised he is not taking any of those meds.

## 2024-08-04 NOTE — Telephone Encounter (Signed)
 Wife Lauri) wants a call back regarding holding patient's medication prior to procedure on Tuesday (8/19).

## 2024-08-04 NOTE — Telephone Encounter (Signed)
 Received a call back from patient's wife.Stated PCP prescribed Amoxicillin yesterday.She wanted to make sure ok to take before  cardiac cath next week.Advised ok to take.

## 2024-08-07 ENCOUNTER — Ambulatory Visit (HOSPITAL_COMMUNITY): Admission: RE | Admit: 2024-08-07 | Source: Ambulatory Visit

## 2024-08-08 ENCOUNTER — Ambulatory Visit (HOSPITAL_COMMUNITY)
Admission: RE | Admit: 2024-08-08 | Discharge: 2024-08-08 | Disposition: A | Discharge status: Home / Self Care | Source: Ambulatory Visit | Attending: Internal Medicine | Admitting: Internal Medicine

## 2024-08-08 ENCOUNTER — Ambulatory Visit: Payer: Self-pay | Admitting: Internal Medicine

## 2024-08-08 DIAGNOSIS — I517 Cardiomegaly: Secondary | ICD-10-CM | POA: Diagnosis not present

## 2024-08-08 DIAGNOSIS — Z01818 Encounter for other preprocedural examination: Secondary | ICD-10-CM | POA: Diagnosis not present

## 2024-08-08 DIAGNOSIS — I350 Nonrheumatic aortic (valve) stenosis: Secondary | ICD-10-CM | POA: Diagnosis not present

## 2024-08-08 DIAGNOSIS — Z48812 Encounter for surgical aftercare following surgery on the circulatory system: Secondary | ICD-10-CM | POA: Diagnosis not present

## 2024-08-08 DIAGNOSIS — I700 Atherosclerosis of aorta: Secondary | ICD-10-CM | POA: Diagnosis not present

## 2024-08-08 DIAGNOSIS — I2510 Atherosclerotic heart disease of native coronary artery without angina pectoris: Secondary | ICD-10-CM | POA: Diagnosis not present

## 2024-08-08 MED ORDER — IOHEXOL 350 MG/ML SOLN
100.0000 mL | Freq: Once | INTRAVENOUS | Status: AC | PRN
  Administered 2024-08-08: 100 mL via INTRAVENOUS

## 2024-08-11 ENCOUNTER — Encounter: Admitting: Thoracic Surgery (Cardiothoracic Vascular Surgery)

## 2024-08-16 ENCOUNTER — Ambulatory Visit: Attending: Surgery | Admitting: Surgery

## 2024-08-16 ENCOUNTER — Encounter: Payer: Self-pay | Admitting: Surgery

## 2024-08-16 VITALS — BP 95/53 | HR 56 | Resp 18 | Ht 65.5 in | Wt 121.0 lb

## 2024-08-16 DIAGNOSIS — I350 Nonrheumatic aortic (valve) stenosis: Secondary | ICD-10-CM

## 2024-08-17 ENCOUNTER — Other Ambulatory Visit: Payer: Self-pay | Admitting: Internal Medicine

## 2024-08-20 ENCOUNTER — Encounter: Payer: Self-pay | Admitting: Surgery

## 2024-08-20 NOTE — Progress Notes (Signed)
 Patient ID: AHNAF CAPONI, male   DOB: [birth year], 83 y.o.   MRN: [id number]  [facility]       [address]       [name] [address]             [phone]          CARDIOTHORACIC SURGERY CONSULTATION REPORT  PCP is [name], MD Referring Provider is Lurena Red, MD Primary Cardiologist is Arun K Thukkani, MD  Reason for consultation:  Severe aortic stenosis  HPI:  The patient is an 83 year old gentleman with a history of persistent atrial fibrillation on Eliquis , stage IIIa chronic kidney disease, peripheral arterial disease status post aortobifemoral bypass in 1987 and left carotid endarterectomy in 2017, coronary artery disease status post CABG x 4 in 2016, status post PCI of the left circumflex in 2023 now with in-stent restenosis and known occluded vein grafts, ischemic cardiomyopathy and VT status post ICD in 2022, HFimp EF status post Barostim in 02/2024, severe concentric LVH concerning for amyloid, and severe aortic stenosis who was referred for consideration of TAVR.  2D echocardiogram on 03/28/2024 showed a mean gradient across the aortic valve of only 10 mmHg with an aortic valve area of 0.75 cm, dimensionless index of 0.24, low stroke-volume index of 26, and left ventricular ejection fraction of 50%.  He presents with exertional fatigue and shortness of breath with walking requiring him to stop frequently to rest.  He has also been having dizzy spells but no syncope.  He has noticed loss of appetite and weight loss.  He has had lower extremity edema improved with diuretics.  He is married and lives with his wife.  His daughter is a Engineer, civil (consulting).  Past Medical History:  Diagnosis Date   Acquired thrombophilia (HCC) 11/01/2021   AICD (automatic cardioverter/defibrillator) present    [employer]   Arthritis    CAD (coronary artery disease)    Carotid artery occlusion    Cataract    Bil  eyes/worse in left eye   CHF (congestive heart failure) (HCC)    Chronic back pain    COVID-19 10/31/2021   DVT (deep venous thrombosis) (HCC)    Dysrhythmia    Enlarged prostate    takes Rapaflo daily   GERD (gastroesophageal reflux disease)    occasional   History of colon polyps    History of gout    has colchicine  prn   History of kidney stones    Hyperlipidemia    takes Crestor  daily   Hypertension    takes Amlodipine  daily   Hypothyroidism    Myocardial infarction [facility])    Peripheral vascular disease (HCC)    Prolonged QT interval 11/01/2021   Pulmonary emboli (HCC) 03/20/2015   elevated d-dimer, intermediate V/Q study, atypical chest pain and SOB. Start on Xarelto  20mg  BID for 3 month   Rapid atrial fibrillation (HCC)    Renal insufficiency    Shortness of breath dyspnea    Urinary frequency    Urinary urgency     Past Surgical History:  Procedure Laterality Date   APPENDECTOMY     BACK SURGERY     5 times   big toe surgery     CARDIAC CATHETERIZATION     2010    dr alveta   cataract surgery     left eye   CHOLECYSTECTOMY N/A 07/27/2016   Procedure: LAPAROSCOPIC CHOLECYSTECTOMY;  Surgeon: Herlene Righter  Kinsinger, MD;  Location: [facility] OR;  Service: General;  Laterality: N/A;   COLONOSCOPY     CORONARY ARTERY BYPASS GRAFT N/A 04/05/2015   Procedure: CORONARY ARTERY BYPASS GRAFTING (CABG)X4 LIMA-LAD; SVG-DIAG1-DIAG2; SVG-PD;  Surgeon: Elspeth JAYSON Millers, MD;  Location: [facility] OR;  Service: Open Heart Surgery;  Laterality: N/A;   CORONARY BALLOON ANGIOPLASTY N/A 04/14/2022   Procedure: CORONARY BALLOON ANGIOPLASTY;  Surgeon: Elmira Newman PARAS, MD;  Location: [facility] INVASIVE CV LAB;  Service: Cardiovascular;  Laterality: N/A;   CORONARY ULTRASOUND/IVUS N/A 04/14/2022   Procedure: Intravascular Ultrasound/IVUS;  Surgeon: Elmira Newman PARAS, MD;  Location: [facility] INVASIVE CV LAB;  Service: Cardiovascular;  Laterality: N/A;   CORONARY/GRAFT ANGIOGRAPHY N/A 04/22/2018   Procedure:  CORONARY/GRAFT ANGIOGRAPHY;  Surgeon: Swazil[place], Peter M, MD;  Location: [facility] INVASIVE CV LAB;  Service: Cardiovascular;  Laterality: N/A;   CYSTOSCOPY     ENDARTERECTOMY Left 04/24/2016   Procedure: ENDARTERECTOMY LEFT CAROTID;  Surgeon: Lynwood JONETTA Collum, MD;  Location: [facility] OR;  Service: Vascular;  Laterality: Left;   EYE SURGERY     FEMORAL ARTERY - POPLITEAL ARTERY BYPASS GRAFT     ICD IMPLANT N/A 12/30/2020   Procedure: ICD IMPLANT;  Surgeon: Waddell Danelle ORN, MD;  Location: [facility] INVASIVE CV LAB;  Service: Cardiovascular;  Laterality: N/A;   JOINT REPLACEMENT     shoulder   LEFT HEART CATH N/A 04/14/2022   Procedure: Left Heart Cath;  Surgeon: Elmira Newman PARAS, MD;  Location: [facility] INVASIVE CV LAB;  Service: Cardiovascular;  Laterality: N/A;   LEFT HEART CATH AND CORONARY ANGIOGRAPHY N/A 04/24/2022   Procedure: LEFT HEART CATH AND CORONARY ANGIOGRAPHY;  Surgeon: Ladona Heinz, MD;  Location: [facility] INVASIVE CV LAB;  Service: Cardiovascular;  Laterality: N/A;   LEFT HEART CATHETERIZATION WITH CORONARY ANGIOGRAM N/A 04/03/2015   Procedure: LEFT HEART CATHETERIZATION WITH CORONARY ANGIOGRAM;  Surgeon: Debby DELENA Sor, MD;  Location: [facility] CATH LAB;  Service: Cardiovascular;  Laterality: N/A;   LUMBAR LAMINECTOMY  01/06/2013   Procedure: MICRODISCECTOMY LUMBAR LAMINECTOMY;  Surgeon: Oneil JAYSON Herald, MD;  Location: [facility] OR;  Service: Orthopedics;  Laterality: N/A;  L3-4 decompression   LUMBAR LAMINECTOMY/DECOMPRESSION MICRODISCECTOMY  02/12/2012   Procedure: LUMBAR LAMINECTOMY/DECOMPRESSION MICRODISCECTOMY;  Surgeon: Catalina CHRISTELLA Stains, MD;  Location: [facility] NEURO ORS;  Service: Neurosurgery;  Laterality: N/A;  Lumbar four-five laminectomy   PATCH ANGIOPLASTY Left 04/24/2016   Procedure: LEFT CAROTID ARTERY PATCH ANGIOPLASTY;  Surgeon: Lynwood JONETTA Collum, MD;  Location[facility] OR;  Service: Vascular;  Laterality: Left;   RIGHT HEART CATH N/A 11/22/2023   Procedure: RIGHT HEART CATH;  Surgeon: Rolan Ezra RAMAN, MD;  Location: [facility] INVASIVE CV LAB;   Service: Cardiovascular;  Laterality: N/A;   RIGHT HEART CATH AND CORONARY/GRAFT ANGIOGRAPHY N/A 01/30/2019   Procedure: RIGHT HEART CATH AND CORONARY/GRAFT ANGIOGRAPHY;  Surgeon: Sor Debby DELENA, MD;  Location: [facility] INVASIVE CV LAB;  Service: Cardiovascular;  Laterality: N/A;   RIGHT/LEFT HEART CATH AND CORONARY ANGIOGRAPHY N/A 03/17/2022   Procedure: RIGHT/LEFT HEART CATH AND CORONARY ANGIOGRAPHY;  Surgeon: Elmira Newman PARAS, MD;  Location: [facility] INVASIVE CV LAB;  Service: Cardiovascular;  Laterality: N/A;   RIGHT/LEFT HEART CATH AND CORONARY/GRAFT ANGIOGRAPHY N/A 07/27/2024   Procedure: RIGHT/LEFT HEART CATH AND CORONARY/GRAFT ANGIOGRAPHY;  Surgeon: Wendel Lurena POUR, MD;  Location: [facility] INVASIVE CV LAB;  Service: Cardiovascular;  Laterality: N/A;   STERIOD INJECTION Right 01/09/2014   Procedure: STEROID INJECTION;  Surgeon: Lonni CINDERELLA Poli, MD;  Location: [facility] OR;  Service: Orthopedics;  Laterality: Right;  TEE WITHOUT CARDIOVERSION N/A 04/05/2015   Procedure: TRANSESOPHAGEAL ECHOCARDIOGRAM (TEE);  Surgeon: Elspeth JAYSON Millers, MD;  Location: [facility] OR;  Service: Open Heart Surgery;  Laterality: N/A;   TOTAL HIP ARTHROPLASTY Left 01/09/2014   DR VERNETTA   TOTAL HIP ARTHROPLASTY Left 01/09/2014   Procedure: LEFT TOTAL HIP ARTHROPLASTY ANTERIOR APPROACH and Steroid Injection Right hip;  Surgeon: Lonni CINDERELLA VERNETTA, MD;  Location: [facility] OR;  Service: Orthopedics;  Laterality: Left;   TOTAL HIP ARTHROPLASTY Right 08/15/2019   TOTAL HIP ARTHROPLASTY Right 08/15/2019   Procedure: RIGHT TOTAL HIP ARTHROPLASTY ANTERIOR APPROACH;  Surgeon: VERNETTA Lonni CINDERELLA, MD;  Location: [facility] OR;  Service: Orthopedics;  Laterality: Right;    Family History  Problem Relation Age of Onset   Heart disease Father    Heart attack Father    Heart disease Sister    Hypertension Sister    Heart attack Sister    Hypertension Mother    Diabetes Son     Social History   Socioeconomic History   Marital status: Married     Spouse name: Adem Costlow   Number of children: 2   Years of education: Not on file   Highest education level: 9th grade  Occupational History   Not on file  Tobacco Use   Smoking status: Former    Current packs/day: 0.00    Average packs/day: 1 pack/day for 25.0 years (25.0 ttl pk-yrs)    Types: Cigarettes    Start date: 02/04/1962    Quit date: 02/04/1987    Years since quitting: 37.5   Smokeless tobacco: Former    Types: Chew    Quit date: 07/20/2009   Tobacco comments:    quit 35+yrs ago  Vaping Use   Vaping status: Never Used  Substance and Sexual Activity   Alcohol  use: No    Alcohol /week: 0.0 standard drinks of alcohol    Drug use: No   Sexual activity: Not Currently  Other Topics Concern   Not on file  Social History Narrative   Not on file   Social Drivers of Health   Financial Resource Strain: Low Risk  (08/28/2021)   Overall Financial Resource Strain (CARDIA)    Difficulty of Paying Living Expenses: Not hard at all  Food Insecurity: No Food Insecurity (02/23/2024)   Hunger Vital Sign    Worried About Running Out of Food in the Last Year: Never true    Ran Out of Food in the Last Year: Never true  Transportation Needs: No Transportation Needs (02/23/2024)   PRAPARE - Administrator, Civil Service (Medical): No    Lack of Transportation (Non-Medical): No  Physical Activity: Not on file  Stress: Not on file  Social Connections: Moderately Integrated (02/23/2024)   Social Connection and Isolation Panel    Frequency of Communication with Friends and Family: More than three times a week    Frequency of Social Gatherings with Friends and Family: More than three times a week    Attends Religious Services: More than 4 times per year    Active Member of [employer] or Organizations: No    Attends Banker Meetings: Never    Marital Status: Married  Catering manager Violence: Not At Risk (02/23/2024)   Humiliation, Afraid, Rape, and Kick questionnaire     Fear of Current or Ex-Partner: No    Emotionally Abused: No    Physically Abused: No    Sexually Abused: No    Prior to Admission medications  Medication Sig Start Date End Date Taking? Authorizing Provider  ALPRAZolam  (XANAX ) 0.5 MG tablet Take 0.5 mg by mouth 3 (three) times daily as needed for anxiety or sleep. 10/07/21  Yes [provider]  amiodarone  (PACERONE ) 100 MG tablet Take 1 tablet (100 mg total) by mouth daily. 07/25/24  Yes Lee, [place], NP  apixaban  (ELIQUIS ) 5 MG TABS tablet Take 5 mg by mouth 2 (two) times daily.   Yes [provider]  bisacodyl  (DULCOLAX) 5 MG EC tablet Take 10 mg by mouth at bedtime.   Yes [provider]  colchicine  0.6 MG tablet Take 1 tablet (0.6 mg total) by mouth 2 (two) times daily. Patient taking differently: Take 0.6 mg by mouth 2 (two) times daily as needed (gout pain). 01/03/24  Yes Regal, Pasco RAMAN, DPM  digoxin  (LANOXIN ) 0.125 MG tablet Take 0.5 tablets (0.0625 mg total) by mouth every other day. 01/26/24  Yes Rolan Ezra RAMAN, MD  Evolocumab  (REPATHA  SURECLICK) 140 MG/ML SOAJ Inject 140 mg into the skin every 14 (fourteen) days. 03/23/24  Yes Patwardhan, Newman PARAS, MD  isosorbide  mononitrate (IMDUR ) 30 MG 24 hr tablet Take 1 tablet (30 mg total) by mouth daily. 06/02/24  Yes Patwardhan, Manish J, MD  levothyroxine  (SYNTHROID ) 50 MCG tablet Take 1 tablet (50 mcg total) by mouth daily before breakfast. PLEASE SCHEDULE APPOINTMENT FOR MORE REFILLS 07/14/24  Yes Rolan Ezra RAMAN, MD  meclizine  (ANTIVERT ) 12.5 MG tablet TAKE 2 TABLETS BY MOUTH 3 TIMES A DAY AS NEEDED **FURTHER REFILLS NEED TO BE PRESCRIBED BY PRIMARY CARE PHYSICIAN** 07/18/24  Yes Patwardhan, Manish J, MD  methocarbamol  (ROBAXIN ) 500 MG tablet Take 500 mg by mouth daily as needed for muscle spasms (back pain.).   Yes [provider]  nitroGLYCERIN  (NITROSTAT ) 0.4 MG SL tablet Place 1 tablet (0.4 mg total) under the tongue every 5 (five) minutes as needed for  chest pain. 07/09/21  Yes Waddell Danelle ORN, MD  ondansetron  (ZOFRAN ) 4 MG tablet Take 4 mg by mouth every 8 (eight) hours as needed for nausea or vomiting.   Yes [provider]  oxyCODONE  (ROXICODONE ) 5 MG immediate release tablet Take 1 tablet (5 mg total) by mouth every 6 (six) hours as needed. 02/24/24  Yes Rhyne, Samantha J, PA-C  pantoprazole  (PROTONIX ) 40 MG tablet Take 1 tablet (40 mg total) by mouth daily. Patient taking differently: Take 40 mg by mouth daily as needed (acid reflux). 11/12/21  Yes Weaver, Scott T, PA-C  polyethylene glycol powder (GLYCOLAX /MIRALAX ) 17 GM/SCOOP powder Take 17 g by mouth at bedtime. 01/21/22  Yes [provider]  potassium chloride  (KLOR-CON  M) 10 MEQ tablet TAKE 1 TABLET BY MOUTH DAILY 07/25/24  Yes Milford, Harlene HERO, FNP  sacubitril -valsartan  (ENTRESTO ) 24-26 MG Take 1 tablet by mouth 2 (two) times daily. 01/12/24  Yes Rolan Ezra RAMAN, MD  tobramycin  (TOBREX ) 0.3 % ophthalmic solution Place 1 drop into the left eye daily as needed (Dry eyes).   Yes [provider]  torsemide  (DEMADEX ) 20 MG tablet Take 40 mg by mouth daily.   Yes [provider]    Current Outpatient Medications  Medication Sig Dispense Refill   ALPRAZolam  (XANAX ) 0.5 MG tablet Take 0.5 mg by mouth 3 (three) times daily as needed for anxiety or sleep.     amiodarone  (PACERONE ) 100 MG tablet Take 1 tablet (100 mg total) by mouth daily. 30 tablet 3   apixaban  (ELIQUIS ) 5 MG TABS tablet Take 5 mg by mouth 2 (two)  times daily.     bisacodyl  (DULCOLAX) 5 MG EC tablet Take 10 mg by mouth at bedtime.     colchicine  0.6 MG tablet Take 1 tablet (0.6 mg total) by mouth 2 (two) times daily. (Patient taking differently: Take 0.6 mg by mouth 2 (two) times daily as needed (gout pain).) 60 tablet 3   digoxin  (LANOXIN ) 0.125 MG tablet Take 0.5 tablets (0.0625 mg total) by mouth every other day.     Evolocumab  (REPATHA  SURECLICK) 140 MG/ML SOAJ Inject 140 mg into the skin  every 14 (fourteen) days. 2 mL 0   isosorbide  mononitrate (IMDUR ) 30 MG 24 hr tablet Take 1 tablet (30 mg total) by mouth daily. 90 tablet 3   levothyroxine  (SYNTHROID ) 50 MCG tablet Take 1 tablet (50 mcg total) by mouth daily before breakfast. PLEASE SCHEDULE APPOINTMENT FOR MORE REFILLS 90 tablet 0   meclizine  (ANTIVERT ) 12.5 MG tablet TAKE 2 TABLETS BY MOUTH 3 TIMES A DAY AS NEEDED **FURTHER REFILLS NEED TO BE PRESCRIBED BY PRIMARY CARE PHYSICIAN** 180 tablet 0   methocarbamol  (ROBAXIN ) 500 MG tablet Take 500 mg by mouth daily as needed for muscle spasms (back pain.).     nitroGLYCERIN  (NITROSTAT ) 0.4 MG SL tablet Place 1 tablet (0.4 mg total) under the tongue every 5 (five) minutes as needed for chest pain. 25 tablet 3   ondansetron  (ZOFRAN ) 4 MG tablet Take 4 mg by mouth every 8 (eight) hours as needed for nausea or vomiting.     oxyCODONE  (ROXICODONE ) 5 MG immediate release tablet Take 1 tablet (5 mg total) by mouth every 6 (six) hours as needed. 8 tablet 0   pantoprazole  (PROTONIX ) 40 MG tablet Take 1 tablet (40 mg total) by mouth daily. (Patient taking differently: Take 40 mg by mouth daily as needed (acid reflux).) 30 tablet 11   polyethylene glycol powder (GLYCOLAX /MIRALAX ) 17 GM/SCOOP powder Take 17 g by mouth at bedtime.     potassium chloride  (KLOR-CON  M) 10 MEQ tablet TAKE 1 TABLET BY MOUTH DAILY 30 tablet 6   sacubitril -valsartan  (ENTRESTO ) 24-26 MG Take 1 tablet by mouth 2 (two) times daily. 60 tablet 11   tobramycin  (TOBREX ) 0.3 % ophthalmic solution Place 1 drop into the left eye daily as needed (Dry eyes).     torsemide  (DEMADEX ) 20 MG tablet Take 40 mg by mouth daily.     No current facility-administered medications for this visit.    Allergies  Allergen Reactions   Jardiance  [Empagliflozin ] Nausea Only and Other (See Comments)    Made patient very sick to the stomach.   Zetia  [Ezetimibe ] Other (See Comments)    Myalgias    Ace Inhibitors Cough    Other reaction(s): cough    Aspirin  Other (See Comments)    skin rash/easy bruising   Crestor  [Rosuvastatin ] Other (See Comments)    Myalgias/weakness   Lipitor  [Atorvastatin ] Other (See Comments)    weakness, fatigue (severe)   Vytorin [Ezetimibe -Simvastatin] Other (See Comments)    myalgias   Codeine  Nausea And Vomiting         Unithroid  [Levothyroxine  Sodium] Other (See Comments)    Caused blurry vision per pt      Review of Systems:   General:  + decreased appetite, + decreased energy, no weight gain, + weight loss, no fever  Cardiac:  no chest pain with exertion, no chest pain at rest, +SOB with mild exertion, no resting SOB, no PND, no orthopnea, no palpitations, + arrhythmia, + atrial fibrillation, + LE edema, +  dizzy spells, no syncope  Respiratory:  + exertional shortness of breath, no home oxygen, no productive cough, no dry cough, no bronchitis, no wheezing, no hemoptysis, no asthma, no pain with inspiration or cough, no sleep apnea, no CPAP at night  GI:   + difficulty swallowing, no reflux, no frequent heartburn, no hiatal hernia, no abdominal pain, + constipation, no diarrhea, no hematochezia, no hematemesis, no melena  GU:   no dysuria,  no frequency, no urinary tract infection, no hematuria, no enlarged prostate, no kidney stones, + chronic kidney disease  Vascular:  no pain suggestive of claudication, no pain in feet, no leg cramps, no varicose veins, no DVT, no non-healing foot ulcer  Neuro:   no stroke, no TIA's, no seizures, on headaches, + temporary blindness one eye,  no slurred speech, no peripheral neuropathy, no chronic pain, no instability of gait, no memory/cognitive dysfunction  Musculoskeletal: no arthritis, no joint swelling, no myalgias, no difficulty walking, normal mobility   Skin:   no rash, no itching, no skin infections, no pressure sores or ulcerations  Psych:   + anxiety, no depression, + nervousness, no unusual recent stress  Eyes:   + blurry vision, no floaters, no recent  vision changes, no glasses or contacts  ENT:   no hearing loss, no loose or painful teeth, no dentures, no upper teeth and no problems with lower teeth. Hematologic:  + easy bruising, no abnormal bleeding, no clotting disorder, no frequent epistaxis  Endocrine:  no diabetes, does not check CBG's at home     Physical Exam:   BP (!) 95/53   Pulse (!) 56   Resp 18   Ht 5' 5.5 (1.664 m)   Wt 121 lb (54.9 kg)   SpO2 98% Comment: RA  BMI 19.83 kg/m   General:  Elderly,  well-appearing  HEENT:  Unremarkable, NCAT, PERLA, EOMI  Neck:   no JVD, no bruits, no adenopathy   Chest:   clear to auscultation, symmetrical breath sounds, no wheezes, no rhonchi   CV:   RRR, 3/6 systolic murmur RSB, no diastolic murmur  Abdomen:  soft, non-tender, no masses   Extremities:  warm, well-perfused, pedal pulses not palpable, mild lower extremity edema  Rectal/GU  Deferred  Neuro:   Grossly non-focal and symmetrical throughout  Skin:   Clean and dry, no rashes, no breakdown  Diagnostic Tests:  ECHOCARDIOGRAM REPORT       Patient Name:   [place] [name] Date of Exam: 03/28/2024  Medical Rec #:  [id number]         Height:       66.0 in  Accession #:    [id number]        Weight:       135.4 lb  Date of Birth:  09/10/1941         BSA:          1.694 m  Patient Age:    82 years          BP:           104/45 mmHg  Patient Gender: M                 HR:           46 bpm.  Exam Location:  [address]   Procedure: 2D Echo, 3D Echo, Cardiac Doppler and Color Doppler (Both  Spectral            and Color Flow Doppler were  utilized during procedure).   Indications:    I50.20 CHF    History:        Patient has prior history of Echocardiogram examinations,  most                 recent 08/10/2023. Ischemic cardiomyopathy and CHF, CAD,  Prior                 CABG, Carotid Disease, Stroke and CKD; Arrythmias:NSVT.    Sonographer:    Waldo Guadalajara RCS  Referring Phys: [id number] [facility] J PATWARDHAN    IMPRESSIONS     1. Severe concentric hypertrophy with severely reduced mitral tissue  doppler velocities. Strain with apical sparing. Findings concerning for  cardiac amyloidosis, consider CMR. Left ventricular ejection fraction, by  estimation, is 50 to 55%. The left  ventricle has low normal function. The left ventricle demonstrates  regional wall motion abnormalities (see scoring diagram/findings for  description). There is severe concentric left ventricular hypertrophy.  Indeterminate diastolic filling due to E-A  fusion. The average left ventricular global longitudinal strain is -12.5  %. The global longitudinal strain is abnormal.   2. Right ventricular systolic function is mildly reduced. The right  ventricular size is mildly enlarged. There is normal pulmonary artery  systolic pressure. The estimated right ventricular systolic pressure is  35.5 mmHg.   3. Left atrial size was mildly dilated.   4. The mitral valve is grossly normal. Mild mitral valve regurgitation.  No evidence of mitral stenosis.   5. Paradoxical low flow low gradient severe aortic stenosis is present.  Vmax 2.1 m/s, MG 10 mmHG, AVA 0.75 cm2, DI 0.24. SVi 26 cc/m2. Concerns  for cardiac amyloidosis. The aortic valve is tricuspid. There is severe  calcifcation of the aortic valve.  There is severe thickening of the aortic valve. Aortic valve regurgitation  is not visualized. Severe aortic valve stenosis.   6. The inferior vena cava is normal in size with greater than 50%  respiratory variability, suggesting right atrial pressure of 3 mmHg.   Comparison(s): No significant change from prior study.   FINDINGS   Left Ventricle: Severe concentric hypertrophy with severely reduced  mitral tissue doppler velocities. Strain with apical sparing. Findings  concerning for cardiac amyloidosis, consider CMR. Left ventricular  ejection fraction, by estimation, is 50 to 55%.   The left ventricle has low normal  function. The left ventricle  demonstrates regional wall motion abnormalities. The average left  ventricular global longitudinal strain is -12.5 %. Strain was performed  and the global longitudinal strain is abnormal. 3D  ejection fraction reviewed and evaluated as part of the interpretation.  Alternate measurement of EF is felt to be most reflective of LV function.  The left ventricular internal cavity size was small. There is severe  concentric left ventricular  hypertrophy. Indeterminate diastolic filling due to E-A fusion.     LV Wall Scoring:  The basal inferior segment is akinetic.   Right Ventricle: The right ventricular size is mildly enlarged. No  increase in right ventricular wall thickness. Right ventricular systolic  function is mildly reduced. There is normal pulmonary artery systolic  pressure. The tricuspid regurgitant velocity   is 2.85 m/s, and with an assumed right atrial pressure of 3 mmHg, the  estimated right ventricular systolic pressure is 35.5 mmHg.   Left Atrium: Left atrial size was mildly dilated.   Right Atrium: Right atrial size was normal in size.   Pericardium: Trivial pericardial effusion is present.  Mitral Valve: The mitral valve is grossly normal. Mild mitral valve  regurgitation. No evidence of mitral valve stenosis.   Tricuspid Valve: The tricuspid valve is grossly normal. Tricuspid valve  regurgitation is mild . No evidence of tricuspid stenosis.   Aortic Valve: Paradoxical low flow low gradient severe aortic stenosis is  present. Vmax 2.1 m/s, MG 10 mmHG, AVA 0.75 cm2, DI 0.24. SVi 26 cc/m2.  Concerns for cardiac amyloidosis. The aortic valve is tricuspid. There is  severe calcifcation of the aortic  valve. There is severe thickening of the aortic valve. Aortic valve  regurgitation is not visualized. Severe aortic stenosis is present. Aortic  valve mean gradient measures 10.0 mmHg. Aortic valve peak gradient  measures 17.6 mmHg. Aortic  valve area, by  VTI measures 0.75 cm.   Pulmonic Valve: The pulmonic valve was grossly normal. Pulmonic valve  regurgitation is trivial. No evidence of pulmonic stenosis.   Aorta: The aortic root and ascending aorta are structurally normal, with  no evidence of dilitation.   Venous: The inferior vena cava is normal in size with greater than 50%  respiratory variability, suggesting right atrial pressure of 3 mmHg.   IAS/Shunts: The atrial septum is grossly normal.   Additional Comments: 3D was performed not requiring image post processing  on an independent workstation and was indeterminate. A device lead is  visualized in the right atrium and right ventricle.     LEFT VENTRICLE  PLAX 2D  LVIDd:         3.80 cm   Diastology  LVIDs:         3.00 cm   LV e' medial:    3.60 cm/s  LV PW:         1.83 cm   LV E/e' medial:  27.9  LV IVS:        1.74 cm   LV e' lateral:   7.77 cm/s  LVOT diam:     2.00 cm   LV E/e' lateral: 12.9  LV SV:         44  LV SV Index:   26        2D Longitudinal Strain  LVOT Area:     3.14 cm  2D Strain GLS Avg:     -12.5 %                             3D Volume EF:                           3D EF:        43 %                           LV EDV:       125 ml                           LV ESV:       71 ml                           LV SV:        54 ml   RIGHT VENTRICLE  RV Basal diam:  4.20 cm  RV Mid diam:    3.50 cm  RV S prime:     8.50 cm/s  TAPSE (M-mode): 1.0 cm  RVSP:           35.5 mmHg   LEFT ATRIUM              Index        RIGHT ATRIUM           Index  LA diam:        4.60 cm  2.72 cm/m   RA Pressure: 3.00 mmHg  LA Vol (A2C):   107.0 ml 63.15 ml/m  RA Area:     20.30 cm  LA Vol (A4C):   63.6 ml  37.54 ml/m  RA Volume:   68.90 ml  40.67 ml/m  LA Biplane Vol: 85.3 ml  50.35 ml/m   AORTIC VALVE  AV Area (Vmax):    0.65 cm  AV Area (Vmean):   0.63 cm  AV Area (VTI):     0.75 cm  AV Vmax:           210.00 cm/s  AV Vmean:           150.000 cm/s  AV VTI:            0.591 m  AV Peak Grad:      17.6 mmHg  AV Mean Grad:      10.0 mmHg  LVOT Vmax:         43.30 cm/s  LVOT Vmean:        29.950 cm/s  LVOT VTI:          0.140 m  LVOT/AV VTI ratio: 0.24    AORTA  Ao Root diam: 3.70 cm  Ao Asc diam:  3.00 cm   MITRAL VALVE                TRICUSPID VALVE  MV Area (PHT):              TR Peak grad:   32.5 mmHg  MV Decel Time:              TR Vmax:        285.00 cm/s  MV E velocity: 100.45 cm/s  Estimated RAP:  3.00 mmHg                              RVSP:           35.5 mmHg                                SHUNTS                              Systemic VTI:  0.14 m                              Systemic Diam: 2.00 cm   Darryle Decent MD  Electronically signed by Darryle Decent MD  Signature Date/Time: 03/28/2024/7:11:06 PM        Final      Procedures  RIGHT/LEFT HEART CATH AND CORONARY/GRAFT ANGIOGRAPHY   Conclusion      Ost LAD to Prox LAD lesion is 50% stenosed.   Prox LAD lesion is 70% stenosed.   Prox LAD to Mid LAD lesion is 100% stenosed.   Ost Cx to Prox Cx lesion is 90% stenosed.   Mid  Cx to Dist Cx lesion is 99% stenosed.   Prox RCA to Mid RCA lesion is 100% stenosed.   Ost Ramus lesion is 40% stenosed.   Origin to Prox Graft lesion is 100% stenosed.   Origin to Prox Graft lesion is 100% stenosed.   Ost 1st Diag lesion is 80% stenosed.   1st Diag lesion is 95% stenosed.   Origin lesion is 50% stenosed.   LIMA and is normal in caliber.   SVG.   SVG.   The graft exhibits no disease.   1.  Patent LIMA to LAD with 50% ostial lesion.  Medical therapy should be pursued and if the patient develops lifestyle limiting angina refractory to medical therapy PCI could be considered. 2.  Severe native vessel disease with high-grade in-stent restenosis of previously placed left circumflex stent. 3.  Fick cardiac output of 3.25 L/min and Fick cardiac index of 2.0 L/min/m with the following hemodynamics:             Right atrial pressure mean of 6 mmHg            RV 36/1 with an end-diastolic pressure of 5 mmHg            Wedge pressure mean of 14 mmHg with V waves to 23 mmHg            PA pressure 38/10 with a mean of 20 mmHg            PVR 1.8 WU            PA pulsatility index 4.7   Recommendation: Continue evaluation for aortic valve intervention.  Treat coronary artery disease medically.   Procedural Details  Technical Details The patient is an 83 year old male with a history of severe symptomatic aortic stenosis, ischemic cardiomyopathy status post barrow stimulator, VT status post ICD, coronary artery disease status post CABG consisting of a LIMA to LAD, vein graft to diagonal 1 and diagonal 2 and vein graft to PDA with most recent catheterization in 2020 demonstrating all vein grafts to be occluded, status post PCI of left circumflex in 2020, atrial fibrillation on Eliquis , CKD stage IIIa, peripheral arterial disease status post aortobifemoral bypass who was seen in the outpatient setting due to lifestyle limiting dyspnea.  He is referred for coronary angiography, bypass angiography, and right heart catheterization as part of his evaluation for treatment of his severe symptomatic aortic stenosis.  After obtaining consent, the patient brought to the cardiac catheterization laboratory and prepped draped sterile fashion.  Xylocaine  was used to anesthetize the area around the previously placed right antecubital IV and this exchanged for 5 French Terumo glide sheath.  Ultrasound was used to gain access to the left radial artery and a 6 French Terumo glide sheath was placed.  5000 units heparin  and 5 mg of verapamil  were administered through the sheath.  A 5 [place] JR4 diagnostic catheter was used for angiography of the LIMA graft.  A 5 French EBU 3.5 guiding catheter was used for angiography of the left system.  A 5 French balloontipped catheter was used for right heart catheterization.  After review of  the angiographic images and hemodynamic data, no further interventions were pursued.  A TR band was placed and manual pressure applied to the antecubital site.  There were no acute complications. Estimated blood loss <50 mL.   During this procedure medications were administered to achieve and maintain moderate conscious sedation while the patient's heart rate, blood pressure, and oxygen  saturation were continuously monitored and I was present face-to-face 100% of this time. Carrolyn Gals RN and Lea Pepper Cardiovascular Specialist are independent, trained observers who assisted in the monitoring of the patient's level of consciousness.   Medications (Filter: Administrations occurring from 0847 to 1018 on 07/27/24) midazolam  (VERSED ) injection (mg)  Total dose: 1 mg Date/Time Rate/Dose/Volume Action   07/27/24 0906 1 mg Given   fentaNYL  (SUBLIMAZE ) injection (mcg)  Total dose: 25 mcg Date/Time Rate/Dose/Volume Action   07/27/24 0906 25 mcg Given   lidocaine  (PF) (XYLOCAINE ) 1 % injection (mL)  Total volume: 4 mL Date/Time Rate/Dose/Volume Action   07/27/24 0920 2 mL Given   0922 2 mL Given   Heparin  (Porcine) in NaCl 1000-0.9 UT/500ML-% SOLN (mL)  Total volume: 1,000 mL Date/Time Rate/Dose/Volume Action   07/27/24 0920 500 mL Given   0920 500 mL Given   heparin  sodium (porcine) injection (Units)  Total dose: 5,000 Units Date/Time Rate/Dose/Volume Action   07/27/24 0932 5,000 Units Given   Radial Cocktail/Verapamil  only (mL)  Total volume: 10 mL Date/Time Rate/Dose/Volume Action   07/27/24 0933 10 mL Given   iohexol  (OMNIPAQUE ) 350 MG/ML injection (mL)  Total volume: 45 mL Date/Time Rate/Dose/Volume Action   07/27/24 1002 45 mL Given    Sedation Time  Sedation Time Physician-1: 53 minutes 57 seconds Contrast     Administrations occurring from 0847 to 1018 on 07/27/24:  Medication Name Total Dose  iohexol  (OMNIPAQUE ) 350 MG/ML injection 45 mL    Radiation/Fluoro  Fluoro time: 10.1 (min) DAP: 13666 (mGycm2) Cumulative Air Kerma: 187 (mGy) Complications  Complications documented before study signed (07/27/2024 10:18 AM)   No complications were associated with this study.  Documented by Pepper Lea B - 07/27/2024 10:01 AM     Coronary Findings  Diagnostic Dominance: Right Left Anterior Descending  Ost LAD to Prox LAD lesion is 50% stenosed.  Prox LAD lesion is 70% stenosed.  Prox LAD to Mid LAD lesion is 100% stenosed.    First Diagonal Branch  Vessel is small in size.  Ost 1st Diag lesion is 80% stenosed. The lesion is severely calcified.  1st Diag lesion is 95% stenosed.    Ramus Intermedius  Ost Ramus lesion is 40% stenosed. The lesion is severely calcified.    Left Circumflex  There is severe diffuse disease throughout the vessel.  Ost Cx to Prox Cx lesion is 90% stenosed. The lesion is severely calcified. The lesion was previously treated .  Mid Cx to Dist Cx lesion is 99% stenosed.    Second Obtuse Marginal Branch    Right Coronary Artery  Prox RCA to Mid RCA lesion is 100% stenosed. The lesion is chronically occluded. The lesion is calcified.    Acute Marginal Branch  Collaterals  Acute Mrg filled by collaterals from Ost 3rd Sept.      Right Posterior Atrioventricular Artery  Collaterals  RPAV filled by collaterals from Mid Cx.      LIMA LIMA Graft To Mid LAD  LIMA and is normal in caliber. The graft exhibits no disease.  Origin lesion is 50% stenosed.    Saphenous Graft To Ramus  SVG.  Origin to Prox Graft lesion is 100% stenosed.    Saphenous Graft To RPDA  SVG.  Origin to Prox Graft lesion is 100% stenosed.    Intervention   No interventions have been documented.   Coronary Diagrams  Diagnostic Dominance: Right  Intervention   Implants   No implant documentation for this case.  Syngo Images   Show images for CARDIAC CATHETERIZATION Images on Long Term Storage    Show images for Saafir, Abdullah to Procedure Log  Procedure Log    Hemo Data  Flowsheet Row Most Recent Value  Fick Cardiac Output 3.25 L/min  Fick Cardiac Output Index 2.01 (L/min)/BSA  RA A Wave 12 mmHg  RA V Wave 12 mmHg  RA Mean 6 mmHg  RV Systolic Pressure 36 mmHg  RV Diastolic Pressure 1 mmHg  RV EDP 5 mmHg  PA Systolic Pressure 38 mmHg  PA Diastolic Pressure 10 mmHg  PA Mean 20 mmHg  PW A Wave 10 mmHg  PW V Wave 23 mmHg  PW Mean 14 mmHg  AO Systolic Pressure 102 mmHg  AO Diastolic Pressure 39 mmHg  AO Mean 62 mmHg  QP/QS 1  TPVR Index 9.95 HRUI  TSVR Index 30.84 HRUI  PVR SVR Ratio 0.11   Narrative & Impression  CLINICAL DATA:  Aortic Stenosis   EXAM: Cardiac TAVR CT   TECHNIQUE: The patient was scanned on a GE apex scanner. 1 beat acquisition triggered in the descending thoracic aorta at 110 HU's. A non contrast, gated CT scan was obtained first with axial slices of 2.5 mm through the heart for valve scoring. A 120 kV retrospective, gated, contrast scan done with gantry rotation speed of 230 msec and collimation 0.63 mm. A delayed scan was obtained to exclude LAA thrombus. The 3D data set was reconstructed in 5% intervals of the R-R cycle. Best systolic phase was motion corrected Images were analyzed on a dedicated workstation using MPR, MIP and VRT modes The patient received 100 cc of contrast   FINDINGS: Aortic Valve: Tri leaflet calcium  score 752   Aorta: Bovine Arch with severe calcific atherosclerosis   Sino-tubular Junction: 27.2 mm   Ascending Thoracic Aorta: 32.5 mm   Aortic Arch: 24.4 mm   Descending Thoracic Aorta: 21.7 mm   Sinus of Valsalva Measurements:   Non-coronary: 32.5 mm  Height 23.5 mm   Right - coronary: 32.2 mm  Height 23.4 mm   Left -   coronary: 34.2 mm  Height 21.5 mm   Coronary Artery Height above Annulus:   Left Main: 14.8 mm above annulus   Right Coronary: 19.7 mm above annulus   Virtual Basal  Annulus Measurements:   Maximum / Minimum Diameter: 26.8 mm x 21.8 mm Average diameter 24.5 mm   Perimeter: 79.5 mm   Area: 473 mm 2   Coronary Arteries: Sufficient height above annulus for deployment Two occluded SVG;s noted Patent LIMA to LAD   Optimum Fluoroscopic Angle for Delivery: LAO 3 Caudal 3 degrees   Membranous septal length 9.3 mm   IMPRESSION: 1. Tri leaflet AV with calcium  score only 752   2. Annular area of 473 mm2 suitable for a 26 mm Sapien 3 valve. Alternatively can consider 29 mm Medtronic Evolut valve   3. Coronary arteries sufficient height above annulus for deployment. Patent LIMA to LAD. Two occluded SVG;s noted   4.  Optimum angiographic angle for deployment LAO 3 Caudal 3 degrees   5.  Membranous septal length 9.3 mm   6.  AICD wires noted in RA/RV   Maude Emmer   Electronically Signed: By: Maude Emmer M.D. On: 08/08/2024 13:10      Narrative & Impression  CLINICAL DATA:  Aortic valve replacement, preoperative evaluation   EXAM: CTA ABDOMEN AND PELVIS WITHOUT AND WITH CONTRAST   TECHNIQUE: Multidetector CT imaging  of the abdomen and pelvis was performed using the standard protocol during bolus administration of intravenous contrast. Multiplanar reconstructed images and MIPs were obtained and reviewed to evaluate the vascular anatomy.   RADIATION DOSE REDUCTION: This exam was performed according to the departmental dose-optimization program which includes automated exposure control, adjustment of the mA and/or kV according to patient size and/or use of iterative reconstruction technique.   CONTRAST:  OMNIPAQUE  IOHEXOL  350 MG/ML SOLN   COMPARISON:  CT angiogram of the abdomen and pelvis performed September 10, 2021   FINDINGS: VASCULAR   Aorta: Mild diffuse atherosclerotic changes are present in the proximal abdominal aorta. There are postsurgical changes in the infrarenal segment from aortobifemoral bypass. Minimal  luminal diameter of the native segment is 9 mm. Minimal luminal diameter of the surgical bypass graft is 15 mm.   Celiac: Focal moderate stenosis in the proximal celiac artery secondary to irregular calcified plaque.   SMA: Mild atherosclerotic changes in the proximal segment, patent.   Renals: Mild atherosclerotic changes bilaterally, patent.   IMA: The native origin is likely occluded with proximal branch reconstitution.   Inflow: The native right common iliac artery is occluded. There is reconstitution of the native right external and internal iliac arteries. The right surgical graft limb is widely patent with minimal diameter of 7 mm.   The the native left common iliac artery is occluded. The left internal and external iliac arteries are reconstituted via collaterals. The left surgical graft limb is widely patent with minimal diameter estimated at 6 mm.   Proximal Outflow: Patent.   Veins: There is mild compression of the native left common iliac vein by the overlying right common iliac artery.   Review of the MIP images confirms the above findings.   NON-VASCULAR   Lower chest: Reported separately.   Hepatobiliary: The gallbladder is not clearly seen and may be surgically absent.   Pancreas: No acute change.  Mild pancreatic atrophy.   Spleen: Nothing significant.   Adrenals/Urinary Tract: Cortical thinning is present in both kidneys. The adrenal glands are grossly unremarkable. Detailed evaluation of the urinary bladder is limited by beam hardening artifact.   A simple appearing left lower pole renal cyst is present measuring 2.4 cm.   Stomach/Bowel: No dilated loops of bowel are appreciated. A moderate volume of stool material is present in the colon.   Lymphatic: Nothing significant.   Reproductive: Not well seen.   Other: Beam hardening artifact from bilateral hip arthroplasty.   Musculoskeletal: Degenerative changes are present in the  imaged osseous structures.   IMPRESSION: 1. Postsurgical changes from aortobifemoral bypass graft placement with patent surgical limbs. The native infrarenal abdominal aorta and common iliac arteries are occluded. The native external and internal iliac arteries are patent which is a consideration given possibility of transfemoral intervention.     Electronically Signed   By: Maude Naegeli M.D.   On: 08/14/2024 11:13        Impression:  This 83 year old gentleman has stage D, severe, symptomatic, paradoxical low-flow/low gradient aortic stenosis with NYHA class II symptoms of exertional fatigue and shortness of breath consistent with chronic diastolic congestive heart failure.  He has  also been having episodes of dizziness, lower extremity edema, and reduced appetite with weight loss.  I have personally reviewed his 2D echocardiogram, cardiac catheterization, and CTA studies.  His echo shows a severely calcified aortic valve with a low mean gradient of 10 mmHg, aortic valve area of 0.75 cm, dimensionless index 0.24,  and low stroke-volume index of 26.  Left ventricular ejection fraction is 50 to 55% with severe concentric LVH concerning for possible amyloid.  Cardiac MRI was not possible due to his implanted Barostim device.  Cardiac catheterization showed a patent LIMA to the LAD with a 50% ostial stenosis.  There is severe native three-vessel disease with 90% restenosis within the previously placed left circumflex stent.  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  I agree that aortic valve replacement is indicated in this patient for relief of his symptoms and to prevent further left ventricular dysfunction.  Given his age, comorbidities and prior coronary bypass surgery I do not think he is a candidate for redo sternotomy and open surgical AVR.  I think transcatheter aortic valve replacement be a reasonable alternative for treating him.  His gated cardiac CTA shows anatomy suitable for TAVR  using a 26 mm SAPIEN 3 valve.  He has an aortobifemoral bypass graft in place but appears to transfemoral access.  The patient and his family were counseled at length regarding treatment alternatives for management of severe symptomatic aortic stenosis. The risks and benefits of surgical intervention has been discussed in detail. Long-term prognosis with medical therapy was discussed. Alternative approaches such as conventional surgical aortic valve replacement, transcatheter aortic valve replacement, and palliative medical therapy were compared and contrasted at length. This discussion was placed in the context of the patient's own specific clinical presentation and past medical history. All of their questions have been addressed.   Following the decision to proceed with transcatheter aortic valve replacement, a discussion was held regarding what types of management strategies would be attempted intraoperatively in the event of life-threatening complications, including whether or not the patient would be considered a candidate for the use of cardiopulmonary bypass and/or conversion to open sternotomy for attempted surgical intervention.  Given his age, prior CABG surgery, and other comorbidities I do not think he is a candidate for emergent sternotomy to manage any intraoperative complications.  The patient has been advised of a variety of complications that might develop including but not limited to risks of death, stroke, paravalvular leak, aortic dissection or other major vascular complications, aortic annulus rupture, device embolization, cardiac rupture or perforation, mitral regurgitation, acute myocardial infarction, arrhythmia, heart block or bradycardia requiring permanent pacemaker placement, congestive heart failure, respiratory failure, renal failure, pneumonia, infection, other late complications related to structural valve deterioration or migration, or other complications that might ultimately  cause a temporary or permanent loss of functional independence or other long term morbidity. The patient provides full informed consent for the procedure as described and all questions were answered.      Plan:  He will be scheduled for transfemoral TAVR using a SAPIEN 3 valve on 09/05/2024.  His Eliquis  should be held for 2 days preoperatively.  I spent 60 minutes performing this consultation and > 50% of this time was spent face to face counseling and coordinating the care of this patient's severe symptomatic aortic stenosis.   Dorise LOIS Fellers, MD 08/20/2024 7:42 AM

## 2024-08-22 ENCOUNTER — Other Ambulatory Visit: Payer: Self-pay | Admitting: Internal Medicine

## 2024-08-22 ENCOUNTER — Other Ambulatory Visit: Payer: Self-pay

## 2024-08-22 DIAGNOSIS — I350 Nonrheumatic aortic (valve) stenosis: Secondary | ICD-10-CM

## 2024-08-24 ENCOUNTER — Other Ambulatory Visit: Payer: Self-pay | Admitting: Internal Medicine

## 2024-08-25 ENCOUNTER — Encounter (HOSPITAL_COMMUNITY)

## 2024-08-25 ENCOUNTER — Telehealth: Payer: Self-pay | Admitting: Internal Medicine

## 2024-08-25 NOTE — Telephone Encounter (Signed)
 Spoke with pt's wife, DPR and advised Ranolazine  500mg  d/c'd by Dr Elmira on 06/02/2024.  Pt's wife verbalizes understanding and thanked Charity fundraiser for the call.

## 2024-08-25 NOTE — Telephone Encounter (Signed)
 Pt c/o medication issue:  1. Name of Medication: Ranolazine  500mg  12hr Tablet  2. How are you currently taking this medication (dosage and times per day)? Not taking  3. Are you having a reaction (difficulty breathing--STAT)? no  4. What is your medication issue? Patient's wife calling in regards to getting this medication refilled. Per the pharmacy, Dr. Waddell will not refill medication. It looks like the medication was discontinued by the provider. Patient's wife wants to know if it needs to be refilled or not. Please advise.

## 2024-08-31 ENCOUNTER — Encounter: Payer: Self-pay | Admitting: Internal Medicine

## 2024-08-31 NOTE — Progress Notes (Signed)
 PERIOPERATIVE PRESCRIPTION FOR IMPLANTED CARDIAC DEVICE PROGRAMMING  Patient Information: Name:  Ryan [name]  DOB:  April 03, 1941  MRN:  [id number]  Planned Procedure:  Transcatheter Aortic Valve Replacement, Transfemoral Approach  Surgeon:  Dr. Lurena Red and Dr. Dorise Fellers  Date of Procedure:  09/05/2024  Cautery will be used.  Position during surgery:  Supine   Device Information:  Clinic EP Physician:  Danelle Birmingham, MD   Device Type:  Defibrillator Manufacturer and Phone #:  [employer]: [phone] Pacemaker Dependent?:  No. Date of Last Device Check:  06/28/2024 Normal Device Function?:  Yes.    Electrophysiologist's Recommendations:  Have magnet available. Provide continuous ECG monitoring when magnet is used or reprogramming is to be performed.  Procedure will likely interfere with device function.  Device should be programmed:  Tachy therapies disabled  Per Device Clinic Standing Orders, [name], RN  4:07 PM 08/31/2024

## 2024-09-01 ENCOUNTER — Other Ambulatory Visit: Payer: Self-pay

## 2024-09-01 ENCOUNTER — Encounter (HOSPITAL_COMMUNITY)
Admission: RE | Admit: 2024-09-01 | Discharge: 2024-09-01 | Disposition: A | Discharge status: Home / Self Care | Source: Ambulatory Visit | Attending: Internal Medicine | Admitting: Internal Medicine

## 2024-09-01 ENCOUNTER — Ambulatory Visit (HOSPITAL_COMMUNITY)
Admission: RE | Admit: 2024-09-01 | Discharge: 2024-09-01 | Disposition: A | Discharge status: Home / Self Care | Source: Ambulatory Visit | Attending: Internal Medicine | Admitting: Internal Medicine

## 2024-09-01 ENCOUNTER — Ambulatory Visit: Payer: Self-pay

## 2024-09-01 DIAGNOSIS — I350 Nonrheumatic aortic (valve) stenosis: Secondary | ICD-10-CM | POA: Insufficient documentation

## 2024-09-01 DIAGNOSIS — Z01818 Encounter for other preprocedural examination: Secondary | ICD-10-CM

## 2024-09-01 DIAGNOSIS — Z96612 Presence of left artificial shoulder joint: Secondary | ICD-10-CM | POA: Diagnosis not present

## 2024-09-01 DIAGNOSIS — I517 Cardiomegaly: Secondary | ICD-10-CM | POA: Diagnosis not present

## 2024-09-01 LAB — COMPREHENSIVE METABOLIC PANEL WITH GFR
ALT: 12 U/L (ref 0–44)
AST: 26 U/L (ref 15–41)
Albumin: 3.4 g/dL — ABNORMAL LOW (ref 3.5–5.0)
Alkaline Phosphatase: 50 U/L (ref 38–126)
Anion gap: 13 (ref 5–15)
BUN: 20 mg/dL (ref 8–23)
CO2: 24 mmol/L (ref 22–32)
Calcium: 9.1 mg/dL (ref 8.9–10.3)
Chloride: 105 mmol/L (ref 98–111)
Creatinine, Ser: 1.24 mg/dL (ref 0.61–1.24)
GFR, Estimated: 58 mL/min — ABNORMAL LOW (ref >60)
Glucose, Bld: 110 mg/dL — ABNORMAL HIGH (ref 70–99)
Potassium: 4.2 mmol/L (ref 3.5–5.1)
Sodium: 142 mmol/L (ref 135–145)
Total Bilirubin: 0.7 mg/dL (ref 0.0–1.2)
Total Protein: 7.1 g/dL (ref 6.5–8.1)

## 2024-09-01 LAB — CBC
HCT: 41.7 % (ref 39.0–52.0)
Hemoglobin: 13.4 g/dL (ref 13.0–17.0)
MCH: 31.9 pg (ref 26.0–34.0)
MCHC: 32.1 g/dL (ref 30.0–36.0)
MCV: 99.3 fL (ref 80.0–100.0)
Platelets: 300 K/uL (ref 150–400)
RBC: 4.2 MIL/uL — ABNORMAL LOW (ref 4.22–5.81)
RDW: 13.9 % (ref 11.5–15.5)
WBC: 7.3 K/uL (ref 4.0–10.5)
nRBC: 0 % (ref 0.0–0.2)

## 2024-09-01 LAB — URINALYSIS, ROUTINE W REFLEX MICROSCOPIC
Bilirubin Urine: NEGATIVE
Glucose, UA: NEGATIVE mg/dL
Hgb urine dipstick: NEGATIVE
Ketones, ur: NEGATIVE mg/dL
Nitrite: NEGATIVE
Protein, ur: 30 mg/dL — AB
Specific Gravity, Urine: 1.017 (ref 1.005–1.030)
WBC, UA: >50 WBC/hpf (ref 0–5)
pH: 5 (ref 5.0–8.0)

## 2024-09-01 LAB — PROTIME-INR
INR: 1.1 (ref 0.8–1.2)
Prothrombin Time: 14.4 s (ref 11.4–15.2)

## 2024-09-01 LAB — TYPE AND SCREEN
ABO/RH(D): A POS
Antibody Screen: NEGATIVE

## 2024-09-01 LAB — SURGICAL PCR SCREEN
MRSA, PCR: NEGATIVE
Staphylococcus aureus: NEGATIVE

## 2024-09-01 NOTE — Progress Notes (Signed)
 Device orders received and placed on chart.  ICD [employer] rep Redell Sharps made aware of arrival time 0900

## 2024-09-01 NOTE — Progress Notes (Signed)
 Message sent to Tinnie Daring, RN re: patient's urine results from PAT appointment.

## 2024-09-01 NOTE — Progress Notes (Addendum)
 Patient signed all consents at PAT lab appointment. CHG soap and instructions were given to patient. CHG surgical prep reviewed with patient and all questions answered.  Patients chart send to anesthesia for review. Pt denies any respiratory illness/infection in the last two months.   Magnet was placed on Barostim by Lamarr Hummer, PA with TAVR team to allow appropriate read on EKG. Pt tolerated EKG well.   [employer] ICD device orders requested and received 08/31/2024.  [employer] Rep, Joey, notified pt name, date of surgery, time of surgery and pt arrival time. Joey also made aware of device order recommendations.

## 2024-09-04 MED ORDER — DEXMEDETOMIDINE HCL IN NACL 400 MCG/100ML IV SOLN
0.1000 ug/kg/h | INTRAVENOUS | Status: AC
Start: 2024-09-05 — End: 2024-09-06
  Administered 2024-09-05 (×2): 27.44 ug via INTRAVENOUS
  Administered 2024-09-05: 1 ug/kg/h via INTRAVENOUS
  Filled 2024-09-04: qty 100

## 2024-09-04 MED ORDER — CEFAZOLIN SODIUM-DEXTROSE 2-4 GM/100ML-% IV SOLN
2.0000 g | INTRAVENOUS | Status: AC
  Administered 2024-09-05: 2 g via INTRAVENOUS
  Filled 2024-09-04: qty 100

## 2024-09-04 MED ORDER — POTASSIUM CHLORIDE 2 MEQ/ML IV SOLN
80.0000 meq | INTRAVENOUS | Status: DC
  Filled 2024-09-04 (×2): qty 40

## 2024-09-04 MED ORDER — MAGNESIUM SULFATE 50 % IJ SOLN
40.0000 meq | INTRAMUSCULAR | Status: DC
  Filled 2024-09-04 (×2): qty 9.85

## 2024-09-04 MED ORDER — NOREPINEPHRINE 4 MG/250ML-% IV SOLN
0.0000 ug/min | INTRAVENOUS | Status: AC
  Administered 2024-09-05: 2 ug/min via INTRAVENOUS
  Filled 2024-09-04: qty 250

## 2024-09-04 MED ORDER — HEPARIN 30,000 UNITS/1000 ML (OHS) CELLSAVER SOLUTION
Status: DC
  Filled 2024-09-04 (×2): qty 1000

## 2024-09-04 NOTE — H&P (Signed)
 [address], [place] [address]             [phone]     CARDIOTHORACIC SURGERY ADMISSION HISTORY AND PHYSICAL   PCP is [name], MD Referring Provider is Lurena Red, MD Primary Cardiologist is Lurena MARLA Red, MD   Reason for admission:  Severe aortic stenosis   HPI:   The patient is an 83 year old gentleman with a history of persistent atrial fibrillation on Eliquis , stage IIIa chronic kidney disease, peripheral arterial disease status post aortobifemoral bypass in 1987 and left carotid endarterectomy in 2017, coronary artery disease status post CABG x 4 in 2016, status post PCI of the left circumflex in 2023 now with in-stent restenosis and known occluded vein grafts, ischemic cardiomyopathy and VT status post ICD in 2022, HFimp EF status post Barostim in 02/2024, severe concentric LVH concerning for amyloid, and severe aortic stenosis who was referred for consideration of TAVR.  2D echocardiogram on 03/28/2024 showed a mean gradient across the aortic valve of only 10 mmHg with an aortic valve area of 0.75 cm, dimensionless index of 0.24, low stroke-volume index of 26, and left ventricular ejection fraction of 50%.  He presents with exertional fatigue and shortness of breath with walking requiring him to stop frequently to rest.  He has also been having dizzy spells but no syncope.  He has noticed loss of appetite and weight loss.  He has had lower extremity edema improved with diuretics.   He is married and lives with his wife.  His daughter is a Engineer, civil (consulting).       Past Medical History:  Diagnosis Date   Acquired thrombophilia (HCC) 11/01/2021   AICD (automatic cardioverter/defibrillator) present      [employer]   Arthritis     CAD (coronary artery disease)     Carotid artery occlusion     Cataract      Bil eyes/worse in left eye   CHF (congestive heart failure) (HCC)     Chronic back pain     COVID-19 10/31/2021   DVT (deep venous  thrombosis) (HCC)     Dysrhythmia     Enlarged prostate      takes Rapaflo daily   GERD (gastroesophageal reflux disease)      occasional   History of colon polyps     History of gout      has colchicine  prn   History of kidney stones     Hyperlipidemia      takes Crestor  daily   Hypertension      takes Amlodipine  daily   Hypothyroidism     Myocardial infarction [facility])     Peripheral vascular disease (HCC)     Prolonged QT interval 11/01/2021   Pulmonary emboli (HCC) 03/20/2015    elevated d-dimer, intermediate V/Q study, atypical chest pain and SOB. Start on Xarelto  20mg  BID for 3 month   Rapid atrial fibrillation (HCC)     Renal insufficiency     Shortness of breath dyspnea     Urinary frequency     Urinary urgency                 Past Surgical History:  Procedure Laterality Date   APPENDECTOMY       BACK SURGERY        5 times   big toe surgery       CARDIAC CATHETERIZATION        2010  dr alveta   cataract surgery        left eye   CHOLECYSTECTOMY N/A 07/27/2016    Procedure: LAPAROSCOPIC CHOLECYSTECTOMY;  Surgeon: Herlene Beverley Bureau, MD;  Location[facility] OR;  Service: General;  Laterality: N/A;   COLONOSCOPY       CORONARY ARTERY BYPASS GRAFT N/A 04/05/2015    Procedure: CORONARY ARTERY BYPASS GRAFTING (CABG)X4 LIMA-LAD; SVG-DIAG1-DIAG2; SVG-PD;  Surgeon: Elspeth JAYSON Millers, MD;  Location: [facility] OR;  Service: Open Heart Surgery;  Laterality: N/A;   CORONARY BALLOON ANGIOPLASTY N/A 04/14/2022    Procedure: CORONARY BALLOON ANGIOPLASTY;  Surgeon: Elmira Newman PARAS, MD;  Location: [facility] INVASIVE CV LAB;  Service: Cardiovascular;  Laterality: N/A;   CORONARY ULTRASOUND/IVUS N/A 04/14/2022    Procedure: Intravascular Ultrasound/IVUS;  Surgeon: Elmira Newman PARAS, MD;  Location: [facility] INVASIVE CV LAB;  Service: Cardiovascular;  Laterality: N/A;   CORONARY/GRAFT ANGIOGRAPHY N/A 04/22/2018    Procedure: CORONARY/GRAFT ANGIOGRAPHY;  Surgeon: Swazil[place], Peter M, MD;  Location: [facility]  INVASIVE CV LAB;  Service: Cardiovascular;  Laterality: N/A;   CYSTOSCOPY       ENDARTERECTOMY Left 04/24/2016    Procedure: ENDARTERECTOMY LEFT CAROTID;  Surgeon: Lynwood JONETTA Collum, MD;  Location: [facility] OR;  Service: Vascular;  Laterality: Left;   EYE SURGERY       FEMORAL ARTERY - POPLITEAL ARTERY BYPASS GRAFT       ICD IMPLANT N/A 12/30/2020    Procedure: ICD IMPLANT;  Surgeon: Waddell Danelle ORN, MD;  Location: [facility] INVASIVE CV LAB;  Service: Cardiovascular;  Laterality: N/A;   JOINT REPLACEMENT        shoulder   LEFT HEART CATH N/A 04/14/2022    Procedure: Left Heart Cath;  Surgeon: Elmira Newman PARAS, MD;  Location: [facility] INVASIVE CV LAB;  Service: Cardiovascular;  Laterality: N/A;   LEFT HEART CATH AND CORONARY ANGIOGRAPHY N/A 04/24/2022    Procedure: LEFT HEART CATH AND CORONARY ANGIOGRAPHY;  Surgeon: Ladona Heinz, MD;  Location: [facility] INVASIVE CV LAB;  Service: Cardiovascular;  Laterality: N/A;   LEFT HEART CATHETERIZATION WITH CORONARY ANGIOGRAM N/A 04/03/2015    Procedure: LEFT HEART CATHETERIZATION WITH CORONARY ANGIOGRAM;  Surgeon: Debby DELENA Sor, MD;  Location: [facility] CATH LAB;  Service: Cardiovascular;  Laterality: N/A;   LUMBAR LAMINECTOMY   01/06/2013    Procedure: MICRODISCECTOMY LUMBAR LAMINECTOMY;  Surgeon: Oneil JAYSON Herald, MD;  Location: [facility] OR;  Service: Orthopedics;  Laterality: N/A;  L3-4 decompression   LUMBAR LAMINECTOMY/DECOMPRESSION MICRODISCECTOMY   02/12/2012    Procedure: LUMBAR LAMINECTOMY/DECOMPRESSION MICRODISCECTOMY;  Surgeon: Catalina CHRISTELLA Stains, MD;  Location: [facility] NEURO ORS;  Service: Neurosurgery;  Laterality: N/A;  Lumbar four-five laminectomy   PATCH ANGIOPLASTY Left 04/24/2016    Procedure: LEFT CAROTID ARTERY PATCH ANGIOPLASTY;  Surgeon: Lynwood JONETTA Collum, MD;  Location: [facility] OR;  Service: Vascular;  Laterality: Left;   RIGHT HEART CATH N/A 11/22/2023    Procedure: RIGHT HEART CATH;  Surgeon: Rolan Ezra RAMAN, MD;  Location: [facility] INVASIVE CV LAB;  Service: Cardiovascular;  Laterality: N/A;   RIGHT  HEART CATH AND CORONARY/GRAFT ANGIOGRAPHY N/A 01/30/2019    Procedure: RIGHT HEART CATH AND CORONARY/GRAFT ANGIOGRAPHY;  Surgeon: Sor Debby DELENA, MD;  Location: [facility] INVASIVE CV LAB;  Service: Cardiovascular;  Laterality: N/A;   RIGHT/LEFT HEART CATH AND CORONARY ANGIOGRAPHY N/A 03/17/2022    Procedure: RIGHT/LEFT HEART CATH AND CORONARY ANGIOGRAPHY;  Surgeon: Elmira Newman PARAS, MD;  Location: [facility] INVASIVE CV LAB;  Service: Cardiovascular;  Laterality: N/A;   RIGHT/LEFT HEART CATH AND CORONARY/GRAFT  ANGIOGRAPHY N/A 07/27/2024    Procedure: RIGHT/LEFT HEART CATH AND CORONARY/GRAFT ANGIOGRAPHY;  Surgeon: Thukkani, Arun K, MD;  Location: [facility] INVASIVE CV LAB;  Service: Cardiovascular;  Laterality: N/A;   STERIOD INJECTION Right 01/09/2014    Procedure: STEROID INJECTION;  Surgeon: Lonni CINDERELLA Poli, MD;  Location: [facility] OR;  Service: Orthopedics;  Laterality: Right;   TEE WITHOUT CARDIOVERSION N/A 04/05/2015    Procedure: TRANSESOPHAGEAL ECHOCARDIOGRAM (TEE);  Surgeon: Elspeth JAYSON Millers, MD;  Location: [facility] OR;  Service: Open Heart Surgery;  Laterality: N/A;   TOTAL HIP ARTHROPLASTY Left 01/09/2014    DR POLI   TOTAL HIP ARTHROPLASTY Left 01/09/2014    Procedure: LEFT TOTAL HIP ARTHROPLASTY ANTERIOR APPROACH and Steroid Injection Right hip;  Surgeon: Lonni CINDERELLA Poli, MD;  Location: [facility] OR;  Service: Orthopedics;  Laterality: Left;   TOTAL HIP ARTHROPLASTY Right 08/15/2019   TOTAL HIP ARTHROPLASTY Right 08/15/2019    Procedure: RIGHT TOTAL HIP ARTHROPLASTY ANTERIOR APPROACH;  Surgeon: Poli Lonni CINDERELLA, MD;  Location: [facility] OR;  Service: Orthopedics;  Laterality: Right;               Family History  Problem Relation Age of Onset   Heart disease Father     Heart attack Father     Heart disease Sister     Hypertension Sister     Heart attack Sister     Hypertension Mother     Diabetes Son            Social History         Socioeconomic History   Marital status: Married       Spouse name: Gad Aymond   Number of children: 2   Years of education: Not on file   Highest education level: 9th grade  Occupational History   Not on file  Tobacco Use   Smoking status: Former      Current packs/day: 0.00      Average packs/day: 1 pack/day for 25.0 years (25.0 ttl pk-yrs)      Types: Cigarettes      Start date: 02/04/1962      Quit date: 02/04/1987      Years since quitting: 37.5   Smokeless tobacco: Former      Types: Chew      Quit date: 07/20/2009   Tobacco comments:      quit 35+yrs ago  Vaping Use   Vaping status: Never Used  Substance and Sexual Activity   Alcohol  use: No      Alcohol /week: 0.0 standard drinks of alcohol    Drug use: No   Sexual activity: Not Currently  Other Topics Concern   Not on file  Social History Narrative   Not on file    Social Drivers of Health        Financial Resource Strain: Low Risk  (08/28/2021)    Overall Financial Resource Strain (CARDIA)     Difficulty of Paying Living Expenses: Not hard at all  Food Insecurity: No Food Insecurity (02/23/2024)    Hunger Vital Sign     Worried About Running Out of Food in the Last Year: Never true     Ran Out of Food in the Last Year: Never true  Transportation Needs: No Transportation Needs (02/23/2024)    PRAPARE - Therapist, art (Medical): No     Lack of Transportation (Non-Medical): No  Physical Activity: Not on file  Stress: Not on file  Social Connections: Moderately Integrated (  02/23/2024)    Social Connection and Isolation Panel     Frequency of Communication with Friends and Family: More than three times a week     Frequency of Social Gatherings with Friends and Family: More than three times a week     Attends Religious Services: More than 4 times per year     Active Member of [employer] or Organizations: No     Attends Banker Meetings: Never     Marital Status: Married  Catering manager Violence: Not At Risk (02/23/2024)    Humiliation,  Afraid, Rape, and Kick questionnaire     Fear of Current or Ex-Partner: No     Emotionally Abused: No     Physically Abused: No     Sexually Abused: No             Prior to Admission medications   Medication Sig Start Date End Date Taking? Authorizing Provider  ALPRAZolam  (XANAX ) 0.5 MG tablet Take 0.5 mg by mouth 3 (three) times daily as needed for anxiety or sleep. 10/07/21   Yes [provider]  amiodarone  (PACERONE ) 100 MG tablet Take 1 tablet (100 mg total) by mouth daily. 07/25/24   Yes Lee, [place], NP  apixaban  (ELIQUIS ) 5 MG TABS tablet Take 5 mg by mouth 2 (two) times daily.     Yes [provider]  bisacodyl  (DULCOLAX) 5 MG EC tablet Take 10 mg by mouth at bedtime.     Yes [provider]  colchicine  0.6 MG tablet Take 1 tablet (0.6 mg total) by mouth 2 (two) times daily. Patient taking differently: Take 0.6 mg by mouth 2 (two) times daily as needed (gout pain). 01/03/24   Yes Regal, [place] RAMAN, DPM  digoxin  (LANOXIN ) 0.125 MG tablet Take 0.5 tablets (0.0625 mg total) by mouth every other day. 01/26/24   Yes Rolan Ezra RAMAN, MD  Evolocumab  (REPATHA  SURECLICK) 140 MG/ML SOAJ Inject 140 mg into the skin every 14 (fourteen) days. 03/23/24   Yes Patwardhan, Newman PARAS, MD  isosorbide  mononitrate (IMDUR ) 30 MG 24 hr tablet Take 1 tablet (30 mg total) by mouth daily. 06/02/24   Yes Patwardhan, Manish J, MD  levothyroxine  (SYNTHROID ) 50 MCG tablet Take 1 tablet (50 mcg total) by mouth daily before breakfast. PLEASE SCHEDULE APPOINTMENT FOR MORE REFILLS 07/14/24   Yes Rolan Ezra RAMAN, MD  meclizine  (ANTIVERT ) 12.5 MG tablet TAKE 2 TABLETS BY MOUTH 3 TIMES A DAY AS NEEDED **FURTHER REFILLS NEED TO BE PRESCRIBED BY PRIMARY CARE PHYSICIAN** 07/18/24   Yes Patwardhan, Manish J, MD  methocarbamol  (ROBAXIN ) 500 MG tablet Take 500 mg by mouth daily as needed for muscle spasms (back pain.).     Yes [provider]  nitroGLYCERIN  (NITROSTAT ) 0.4 MG SL tablet Place 1 tablet (0.4  mg total) under the tongue every 5 (five) minutes as needed for chest pain. 07/09/21   Yes Waddell Danelle ORN, MD  ondansetron  (ZOFRAN ) 4 MG tablet Take 4 mg by mouth every 8 (eight) hours as needed for nausea or vomiting.     Yes [provider]  oxyCODONE  (ROXICODONE ) 5 MG immediate release tablet Take 1 tablet (5 mg total) by mouth every 6 (six) hours as needed. 02/24/24   Yes Rhyne, Samantha J, PA-C  pantoprazole  (PROTONIX ) 40 MG tablet Take 1 tablet (40 mg total) by mouth daily. Patient taking differently: Take 40 mg by mouth daily as needed (acid reflux). 11/12/21   Yes Weaver, Scott T, PA-C  polyethylene glycol powder (  GLYCOLAX /MIRALAX ) 17 GM/SCOOP powder Take 17 g by mouth at bedtime. 01/21/22   Yes [provider]  potassium chloride  (KLOR-CON  M) 10 MEQ tablet TAKE 1 TABLET BY MOUTH DAILY 07/25/24   Yes [place], [place], FNP  sacubitril -valsartan  (ENTRESTO ) 24-26 MG Take 1 tablet by mouth 2 (two) times daily. 01/12/24   Yes Rolan Ezra RAMAN, MD  tobramycin  (TOBREX ) 0.3 % ophthalmic solution Place 1 drop into the left eye daily as needed (Dry eyes).     Yes [provider]  torsemide  (DEMADEX ) 20 MG tablet Take 40 mg by mouth daily.     Yes [provider]            Current Outpatient Medications  Medication Sig Dispense Refill   ALPRAZolam  (XANAX ) 0.5 MG tablet Take 0.5 mg by mouth 3 (three) times daily as needed for anxiety or sleep.       amiodarone  (PACERONE ) 100 MG tablet Take 1 tablet (100 mg total) by mouth daily. 30 tablet 3   apixaban  (ELIQUIS ) 5 MG TABS tablet Take 5 mg by mouth 2 (two) times daily.       bisacodyl  (DULCOLAX) 5 MG EC tablet Take 10 mg by mouth at bedtime.       colchicine  0.6 MG tablet Take 1 tablet (0.6 mg total) by mouth 2 (two) times daily. (Patient taking differently: Take 0.6 mg by mouth 2 (two) times daily as needed (gout pain).) 60 tablet 3   digoxin  (LANOXIN ) 0.125 MG tablet Take 0.5 tablets (0.0625 mg total) by mouth every  other day.       Evolocumab  (REPATHA  SURECLICK) 140 MG/ML SOAJ Inject 140 mg into the skin every 14 (fourteen) days. 2 mL 0   isosorbide  mononitrate (IMDUR ) 30 MG 24 hr tablet Take 1 tablet (30 mg total) by mouth daily. 90 tablet 3   levothyroxine  (SYNTHROID ) 50 MCG tablet Take 1 tablet (50 mcg total) by mouth daily before breakfast. PLEASE SCHEDULE APPOINTMENT FOR MORE REFILLS 90 tablet 0   meclizine  (ANTIVERT ) 12.5 MG tablet TAKE 2 TABLETS BY MOUTH 3 TIMES A DAY AS NEEDED **FURTHER REFILLS NEED TO BE PRESCRIBED BY PRIMARY CARE PHYSICIAN** 180 tablet 0   methocarbamol  (ROBAXIN ) 500 MG tablet Take 500 mg by mouth daily as needed for muscle spasms (back pain.).       nitroGLYCERIN  (NITROSTAT ) 0.4 MG SL tablet Place 1 tablet (0.4 mg total) under the tongue every 5 (five) minutes as needed for chest pain. 25 tablet 3   ondansetron  (ZOFRAN ) 4 MG tablet Take 4 mg by mouth every 8 (eight) hours as needed for nausea or vomiting.       oxyCODONE  (ROXICODONE ) 5 MG immediate release tablet Take 1 tablet (5 mg total) by mouth every 6 (six) hours as needed. 8 tablet 0   pantoprazole  (PROTONIX ) 40 MG tablet Take 1 tablet (40 mg total) by mouth daily. (Patient taking differently: Take 40 mg by mouth daily as needed (acid reflux).) 30 tablet 11   polyethylene glycol powder (GLYCOLAX /MIRALAX ) 17 GM/SCOOP powder Take 17 g by mouth at bedtime.       potassium chloride  (KLOR-CON  M) 10 MEQ tablet TAKE 1 TABLET BY MOUTH DAILY 30 tablet 6   sacubitril -valsartan  (ENTRESTO ) 24-26 MG Take 1 tablet by mouth 2 (two) times daily. 60 tablet 11   tobramycin  (TOBREX ) 0.3 % ophthalmic solution Place 1 drop into the left eye daily as needed (Dry eyes).       torsemide  (DEMADEX ) 20 MG tablet Take 40  mg by mouth daily.          No current facility-administered medications for this visit.        Allergies       Allergies  Allergen Reactions   Jardiance  [Empagliflozin ] Nausea Only and Other (See Comments)      Made patient  very sick to the stomach.   Zetia  [Ezetimibe ] Other (See Comments)      Myalgias    Ace Inhibitors Cough      Other reaction(s): cough   Aspirin  Other (See Comments)      skin rash/easy bruising   Crestor  Chrysa.Chum ] Other (See Comments)      Myalgias/weakness   Lipitor  [Atorvastatin ] Other (See Comments)      weakness, fatigue (severe)   Vytorin [Ezetimibe -Simvastatin] Other (See Comments)      myalgias   Codeine  Nausea And Vomiting            Unithroid  [Levothyroxine  Sodium] Other (See Comments)      Caused blurry vision per pt            Review of Systems:               General:                      + decreased appetite, + decreased energy, no weight gain, + weight loss, no fever             Cardiac:                       no chest pain with exertion, no chest pain at rest, +SOB with mild exertion, no resting SOB, no PND, no orthopnea, no palpitations, + arrhythmia, + atrial fibrillation, + LE edema, + dizzy spells, no syncope             Respiratory:                 + exertional shortness of breath, no home oxygen, no productive cough, no dry cough, no bronchitis, no wheezing, no hemoptysis, no asthma, no pain with inspiration or cough, no sleep apnea, no CPAP at night             GI:                               + difficulty swallowing, no reflux, no frequent heartburn, no hiatal hernia, no abdominal pain, + constipation, no diarrhea, no hematochezia, no hematemesis, no melena             GU:                              no dysuria,  no frequency, no urinary tract infection, no hematuria, no enlarged prostate, no kidney stones, + chronic kidney disease             Vascular:                     no pain suggestive of claudication, no pain in feet, no leg cramps, no varicose veins, no DVT, no non-healing foot ulcer             Neuro:                         no stroke, no TIA's,  no seizures, on headaches, + temporary blindness one eye,  no slurred speech, no peripheral neuropathy,  no chronic pain, no instability of gait, no memory/cognitive dysfunction             Musculoskeletal:         no arthritis, no joint swelling, no myalgias, no difficulty walking, normal mobility              Skin:                            no rash, no itching, no skin infections, no pressure sores or ulcerations             Psych:                         + anxiety, no depression, + nervousness, no unusual recent stress             Eyes:                           + blurry vision, no floaters, no recent vision changes, no glasses or contacts             ENT:                            no hearing loss, no loose or painful teeth, no dentures, no upper teeth and no problems with lower teeth.         Hematologic:               + easy bruising, no abnormal bleeding, no clotting disorder, no frequent epistaxis             Endocrine:                   no diabetes, does not check CBG's at home                            Physical Exam:               BP (!) 95/53   Pulse (!) 56   Resp 18   Ht 5' 5.5 (1.664 m)   Wt 121 lb (54.9 kg)   SpO2 98% Comment: RA  BMI 19.83 kg/m              General:                      Elderly,  well-appearing             HEENT:                       Unremarkable, NCAT, PERLA, EOMI             Neck:                           no JVD, no bruits, no adenopathy              Chest:                          clear to auscultation, symmetrical breath sounds, no wheezes, no rhonchi  CV:                              RRR, 3/6 systolic murmur RSB, no diastolic murmur             Abdomen:                    soft, non-tender, no masses              Extremities:                 warm, well-perfused, pedal pulses not palpable, mild lower extremity edema             Rectal/GU                   Deferred             Neuro:                         Grossly non-focal and symmetrical throughout             Skin:                            Clean and dry, no rashes, no breakdown    Diagnostic Tests:   ECHOCARDIOGRAM REPORT       Patient Name:   QUINTERRIUS ERRINGTON Date of Exam: 03/28/2024  Medical Rec #:  [id number]         Height:       66.0 in  Accession #:    [id number]        Weight:       135.4 lb  Date of Birth:  [birth year]         BSA:          1.694 m  Patient Age:    82 years          BP:           104/45 mmHg  Patient Gender: M                 HR:           46 bpm.  Exam Location:  [address]   Procedure: 2D Echo, 3D Echo, Cardiac Doppler and Color Doppler (Both  Spectral            and Color Flow Doppler were utilized during procedure).   Indications:    I50.20 CHF    History:        Patient has prior history of Echocardiogram examinations,  most                 recent 08/10/2023. Ischemic cardiomyopathy and CHF, CAD,  Prior                 CABG, Carotid Disease, Stroke and CKD; Arrythmias:NSVT.    Sonographer:    Waldo Guadalajara RCS  Referring Phys: [id number] [facility] J PATWARDHAN   IMPRESSIONS     1. Severe concentric hypertrophy with severely reduced mitral tissue  doppler velocities. Strain with apical sparing. Findings concerning for  cardiac amyloidosis, consider CMR. Left ventricular ejection fraction, by  estimation, is 50 to 55%. The left  ventricle has low normal function. The left ventricle demonstrates  regional wall motion abnormalities (see scoring diagram/findings for  description). There is severe concentric left ventricular  hypertrophy.  Indeterminate diastolic filling due to E-A  fusion. The average left ventricular global longitudinal strain is -12.5  %. The global longitudinal strain is abnormal.   2. Right ventricular systolic function is mildly reduced. The right  ventricular size is mildly enlarged. There is normal pulmonary artery  systolic pressure. The estimated right ventricular systolic pressure is  35.5 mmHg.   3. Left atrial size was mildly dilated.   4. The mitral valve is grossly normal. Mild mitral valve  regurgitation.  No evidence of mitral stenosis.   5. Paradoxical low flow low gradient severe aortic stenosis is present.  Vmax 2.1 m/s, MG 10 mmHG, AVA 0.75 cm2, DI 0.24. SVi 26 cc/m2. Concerns  for cardiac amyloidosis. The aortic valve is tricuspid. There is severe  calcifcation of the aortic valve.  There is severe thickening of the aortic valve. Aortic valve regurgitation  is not visualized. Severe aortic valve stenosis.   6. The inferior vena cava is normal in size with greater than 50%  respiratory variability, suggesting right atrial pressure of 3 mmHg.   Comparison(s): No significant change from prior study.   FINDINGS   Left Ventricle: Severe concentric hypertrophy with severely reduced  mitral tissue doppler velocities. Strain with apical sparing. Findings  concerning for cardiac amyloidosis, consider CMR. Left ventricular  ejection fraction, by estimation, is 50 to 55%.   The left ventricle has low normal function. The left ventricle  demonstrates regional wall motion abnormalities. The average left  ventricular global longitudinal strain is -12.5 %. Strain was performed  and the global longitudinal strain is abnormal. 3D  ejection fraction reviewed and evaluated as part of the interpretation.  Alternate measurement of EF is felt to be most reflective of LV function.  The left ventricular internal cavity size was small. There is severe  concentric left ventricular  hypertrophy. Indeterminate diastolic filling due to E-A fusion.     LV Wall Scoring:  The basal inferior segment is akinetic.   Right Ventricle: The right ventricular size is mildly enlarged. No  increase in right ventricular wall thickness. Right ventricular systolic  function is mildly reduced. There is normal pulmonary artery systolic  pressure. The tricuspid regurgitant velocity   is 2.85 m/s, and with an assumed right atrial pressure of 3 mmHg, the  estimated right ventricular systolic pressure is 35.5  mmHg.   Left Atrium: Left atrial size was mildly dilated.   Right Atrium: Right atrial size was normal in size.   Pericardium: Trivial pericardial effusion is present.   Mitral Valve: The mitral valve is grossly normal. Mild mitral valve  regurgitation. No evidence of mitral valve stenosis.   Tricuspid Valve: The tricuspid valve is grossly normal. Tricuspid valve  regurgitation is mild . No evidence of tricuspid stenosis.   Aortic Valve: Paradoxical low flow low gradient severe aortic stenosis is  present. Vmax 2.1 m/s, MG 10 mmHG, AVA 0.75 cm2, DI 0.24. SVi 26 cc/m2.  Concerns for cardiac amyloidosis. The aortic valve is tricuspid. There is  severe calcifcation of the aortic  valve. There is severe thickening of the aortic valve. Aortic valve  regurgitation is not visualized. Severe aortic stenosis is present. Aortic  valve mean gradient measures 10.0 mmHg. Aortic valve peak gradient  measures 17.6 mmHg. Aortic valve area, by  VTI measures 0.75 cm.   Pulmonic Valve: The pulmonic valve was grossly normal. Pulmonic valve  regurgitation is trivial. No evidence of pulmonic stenosis.   Aorta: The aortic root and ascending aorta  are structurally normal, with  no evidence of dilitation.   Venous: The inferior vena cava is normal in size with greater than 50%  respiratory variability, suggesting right atrial pressure of 3 mmHg.   IAS/Shunts: The atrial septum is grossly normal.   Additional Comments: 3D was performed not requiring image post processing  on an independent workstation and was indeterminate. A device lead is  visualized in the right atrium and right ventricle.     LEFT VENTRICLE  PLAX 2D  LVIDd:         3.80 cm   Diastology  LVIDs:         3.00 cm   LV e' medial:    3.60 cm/s  LV PW:         1.83 cm   LV E/e' medial:  27.9  LV IVS:        1.74 cm   LV e' lateral:   7.77 cm/s  LVOT diam:     2.00 cm   LV E/e' lateral: 12.9  LV SV:         44  LV SV Index:   26         2D Longitudinal Strain  LVOT Area:     3.14 cm  2D Strain GLS Avg:     -12.5 %                             3D Volume EF:                           3D EF:        43 %                           LV EDV:       125 ml                           LV ESV:       71 ml                           LV SV:        54 ml   RIGHT VENTRICLE  RV Basal diam:  4.20 cm  RV Mid diam:    3.50 cm  RV S prime:     8.50 cm/s  TAPSE (M-mode): 1.0 cm  RVSP:           35.5 mmHg   LEFT ATRIUM              Index        RIGHT ATRIUM           Index  LA diam:        4.60 cm  2.72 cm/m   RA Pressure: 3.00 mmHg  LA Vol (A2C):   107.0 ml 63.15 ml/m  RA Area:     20.30 cm  LA Vol (A4C):   63.6 ml  37.54 ml/m  RA Volume:   68.90 ml  40.67 ml/m  LA Biplane Vol: 85.3 ml  50.35 ml/m   AORTIC VALVE  AV Area (Vmax):    0.65 cm  AV Area (Vmean):   0.63 cm  AV Area (VTI):     0.75 cm  AV Vmax:  210.00 cm/s  AV Vmean:          150.000 cm/s  AV VTI:            0.591 m  AV Peak Grad:      17.6 mmHg  AV Mean Grad:      10.0 mmHg  LVOT Vmax:         43.30 cm/s  LVOT Vmean:        29.950 cm/s  LVOT VTI:          0.140 m  LVOT/AV VTI ratio: 0.24    AORTA  Ao Root diam: 3.70 cm  Ao Asc diam:  3.00 cm   MITRAL VALVE                TRICUSPID VALVE  MV Area (PHT):              TR Peak grad:   32.5 mmHg  MV Decel Time:              TR Vmax:        285.00 cm/s  MV E velocity: 100.45 cm/s  Estimated RAP:  3.00 mmHg                              RVSP:           35.5 mmHg                                SHUNTS                              Systemic VTI:  0.14 m                              Systemic Diam: 2.00 cm   Darryle Decent MD  Electronically signed by Darryle Decent MD  Signature Date/Time: 03/28/2024/7:11:06 PM        Final        Procedures   RIGHT/LEFT HEART CATH AND CORONARY/GRAFT ANGIOGRAPHY    Conclusion       Ost LAD to Prox LAD lesion is 50% stenosed.   Prox LAD lesion is 70%  stenosed.   Prox LAD to Mid LAD lesion is 100% stenosed.   Ost Cx to Prox Cx lesion is 90% stenosed.   Mid Cx to Dist Cx lesion is 99% stenosed.   Prox RCA to Mid RCA lesion is 100% stenosed.   Ost Ramus lesion is 40% stenosed.   Origin to Prox Graft lesion is 100% stenosed.   Origin to Prox Graft lesion is 100% stenosed.   Ost 1st Diag lesion is 80% stenosed.   1st Diag lesion is 95% stenosed.   Origin lesion is 50% stenosed.   LIMA and is normal in caliber.   SVG.   SVG.   The graft exhibits no disease.   1.  Patent LIMA to LAD with 50% ostial lesion.  Medical therapy should be pursued and if the patient develops lifestyle limiting angina refractory to medical therapy PCI could be considered. 2.  Severe native vessel disease with high-grade in-stent restenosis of previously placed left circumflex stent. 3.  Fick cardiac output of 3.25 L/min and Fick cardiac index of 2.0 L/min/m with the following hemodynamics:  Right atrial pressure mean of 6 mmHg            RV 36/1 with an end-diastolic pressure of 5 mmHg            Wedge pressure mean of 14 mmHg with V waves to 23 mmHg            PA pressure 38/10 with a mean of 20 mmHg            PVR 1.8 WU            PA pulsatility index 4.7   Recommendation: Continue evaluation for aortic valve intervention.  Treat coronary artery disease medically.   Procedural Details   Technical Details The patient is an 83 year old male with a history of severe symptomatic aortic stenosis, ischemic cardiomyopathy status post barrow stimulator, VT status post ICD, coronary artery disease status post CABG consisting of a LIMA to LAD, vein graft to diagonal 1 and diagonal 2 and vein graft to PDA with most recent catheterization in 2020 demonstrating all vein grafts to be occluded, status post PCI of left circumflex in 2020, atrial fibrillation on Eliquis , CKD stage IIIa, peripheral arterial disease status post aortobifemoral bypass who was seen in the  outpatient setting due to lifestyle limiting dyspnea.  He is referred for coronary angiography, bypass angiography, and right heart catheterization as part of his evaluation for treatment of his severe symptomatic aortic stenosis.  After obtaining consent, the patient brought to the cardiac catheterization laboratory and prepped draped sterile fashion.  Xylocaine  was used to anesthetize the area around the previously placed right antecubital IV and this exchanged for 5 French Terumo glide sheath.  Ultrasound was used to gain access to the left radial artery and a 6 French Terumo glide sheath was placed.  5000 units heparin  and 5 mg of verapamil  were administered through the sheath.  A 5 [place] JR4 diagnostic catheter was used for angiography of the LIMA graft.  A 5 French EBU 3.5 guiding catheter was used for angiography of the left system.  A 5 French balloontipped catheter was used for right heart catheterization.  After review of the angiographic images and hemodynamic data, no further interventions were pursued.  A TR band was placed and manual pressure applied to the antecubital site.  There were no acute complications. Estimated blood loss <50 mL.   During this procedure medications were administered to achieve and maintain moderate conscious sedation while the patient's heart rate, blood pressure, and oxygen saturation were continuously monitored and I was present face-to-face 100% of this time. Carrolyn Gals RN and Lea Pepper Cardiovascular Specialist are independent, trained observers who assisted in the monitoring of the patient's level of consciousness.    Medications (Filter: Administrations occurring from 0847 to 1018 on 07/27/24) midazolam  (VERSED ) injection (mg)  Total dose: 1 mg Date/Time Rate/Dose/Volume Action    07/27/24 0906 1 mg Given    fentaNYL  (SUBLIMAZE ) injection (mcg)  Total dose: 25 mcg Date/Time Rate/Dose/Volume Action    07/27/24 0906 25 mcg Given    lidocaine   (PF) (XYLOCAINE ) 1 % injection (mL)  Total volume: 4 mL Date/Time Rate/Dose/Volume Action    07/27/24 0920 2 mL Given    0922 2 mL Given    Heparin  (Porcine) in NaCl 1000-0.9 UT/500ML-% SOLN (mL)  Total volume: 1,000 mL Date/Time Rate/Dose/Volume Action    07/27/24 0920 500 mL Given    0920 500 mL Given    heparin  sodium (porcine) injection (Units)  Total dose: 5,000  Units Date/Time Rate/Dose/Volume Action    07/27/24 0932 5,000 Units Given    Radial Cocktail/Verapamil  only (mL)  Total volume: 10 mL Date/Time Rate/Dose/Volume Action    07/27/24 0933 10 mL Given    iohexol  (OMNIPAQUE ) 350 MG/ML injection (mL)  Total volume: 45 mL Date/Time Rate/Dose/Volume Action    07/27/24 1002 45 mL Given      Sedation Time   Sedation Time Physician-1: 53 minutes 57 seconds Contrast        Administrations occurring from 0847 to 1018 on 07/27/24:  Medication Name Total Dose  iohexol  (OMNIPAQUE ) 350 MG/ML injection 45 mL    Radiation/Fluoro   Fluoro time: 10.1 (min) DAP: 13666 (mGycm2) Cumulative Air Kerma: 187 (mGy) Complications   Complications documented before study signed (07/27/2024 10:18 AM)    No complications were associated with this study.  Documented by Franchot Fray B - 07/27/2024 10:01 AM      Coronary Findings   Diagnostic Dominance: Right Left Anterior Descending  Ost LAD to Prox LAD lesion is 50% stenosed.  Prox LAD lesion is 70% stenosed.  Prox LAD to Mid LAD lesion is 100% stenosed.    First Diagonal Branch  Vessel is small in size.  Ost 1st Diag lesion is 80% stenosed. The lesion is severely calcified.  1st Diag lesion is 95% stenosed.    Ramus Intermedius  Ost Ramus lesion is 40% stenosed. The lesion is severely calcified.    Left Circumflex  There is severe diffuse disease throughout the vessel.  Ost Cx to Prox Cx lesion is 90% stenosed. The lesion is severely calcified. The lesion was previously treated .  Mid Cx to Dist Cx lesion is 99% stenosed.     Second Obtuse Marginal Branch    Right Coronary Artery  Prox RCA to Mid RCA lesion is 100% stenosed. The lesion is chronically occluded. The lesion is calcified.    Acute Marginal Branch  Collaterals  Acute Mrg filled by collaterals from Ost 3rd Sept.       Right Posterior Atrioventricular Artery  Collaterals  RPAV filled by collaterals from Mid Cx.       LIMA LIMA Graft To Mid LAD  LIMA and is normal in caliber. The graft exhibits no disease.  Origin lesion is 50% stenosed.    Saphenous Graft To Ramus  SVG.  Origin to Prox Graft lesion is 100% stenosed.    Saphenous Graft To RPDA  SVG.  Origin to Prox Graft lesion is 100% stenosed.    Intervention    No interventions have been documented.    Coronary Diagrams   Diagnostic Dominance: Right  Intervention    Implants    No implant documentation for this case.    Syngo Images    Show images for CARDIAC CATHETERIZATION Images on Long Term Storage    Show images for Verlin, Uher to Procedure Log   Procedure Log    Hemo Data   Flowsheet Row Most Recent Value  Fick Cardiac Output 3.25 L/min  Fick Cardiac Output Index 2.01 (L/min)/BSA  RA A Wave 12 mmHg  RA V Wave 12 mmHg  RA Mean 6 mmHg  RV Systolic Pressure 36 mmHg  RV Diastolic Pressure 1 mmHg  RV EDP 5 mmHg  PA Systolic Pressure 38 mmHg  PA Diastolic Pressure 10 mmHg  PA Mean 20 mmHg  PW A Wave 10 mmHg  PW V Wave 23 mmHg  PW Mean 14 mmHg  AO Systolic Pressure 102 mmHg  AO  Diastolic Pressure 39 mmHg  AO Mean 62 mmHg  QP/QS 1  TPVR Index 9.95 HRUI  TSVR Index 30.84 HRUI  PVR SVR Ratio 0.11    Narrative & Impression  CLINICAL DATA:  Aortic Stenosis   EXAM: Cardiac TAVR CT   TECHNIQUE: The patient was scanned on a GE apex scanner. 1 beat acquisition triggered in the descending thoracic aorta at 110 HU's. A non contrast, gated CT scan was obtained first with axial slices of 2.5 mm through the heart for valve scoring. A 120  kV retrospective, gated, contrast scan done with gantry rotation speed of 230 msec and collimation 0.63 mm. A delayed scan was obtained to exclude LAA thrombus. The 3D data set was reconstructed in 5% intervals of the R-R cycle. Best systolic phase was motion corrected Images were analyzed on a dedicated workstation using MPR, MIP and VRT modes The patient received 100 cc of contrast   FINDINGS: Aortic Valve: Tri leaflet calcium  score 752   Aorta: Bovine Arch with severe calcific atherosclerosis   Sino-tubular Junction: 27.2 mm   Ascending Thoracic Aorta: 32.5 mm   Aortic Arch: 24.4 mm   Descending Thoracic Aorta: 21.7 mm   Sinus of Valsalva Measurements:   Non-coronary: 32.5 mm  Height 23.5 mm   Right - coronary: 32.2 mm  Height 23.4 mm   Left -   coronary: 34.2 mm  Height 21.5 mm   Coronary Artery Height above Annulus:   Left Main: 14.8 mm above annulus   Right Coronary: 19.7 mm above annulus   Virtual Basal Annulus Measurements:   Maximum / Minimum Diameter: 26.8 mm x 21.8 mm Average diameter 24.5 mm   Perimeter: 79.5 mm   Area: 473 mm 2   Coronary Arteries: Sufficient height above annulus for deployment Two occluded SVG;s noted Patent LIMA to LAD   Optimum Fluoroscopic Angle for Delivery: LAO 3 Caudal 3 degrees   Membranous septal length 9.3 mm   IMPRESSION: 1. Tri leaflet AV with calcium  score only 752   2. Annular area of 473 mm2 suitable for a 26 mm Sapien 3 valve. Alternatively can consider 29 mm Medtronic Evolut valve   3. Coronary arteries sufficient height above annulus for deployment. Patent LIMA to LAD. Two occluded SVG;s noted   4.  Optimum angiographic angle for deployment LAO 3 Caudal 3 degrees   5.  Membranous septal length 9.3 mm   6.  AICD wires noted in RA/RV   Maude Emmer   Electronically Signed: By: Maude Emmer M.D. On: 08/08/2024 13:10        Narrative & Impression  CLINICAL DATA:  Aortic valve replacement,  preoperative evaluation   EXAM: CTA ABDOMEN AND PELVIS WITHOUT AND WITH CONTRAST   TECHNIQUE: Multidetector CT imaging of the abdomen and pelvis was performed using the standard protocol during bolus administration of intravenous contrast. Multiplanar reconstructed images and MIPs were obtained and reviewed to evaluate the vascular anatomy.   RADIATION DOSE REDUCTION: This exam was performed according to the departmental dose-optimization program which includes automated exposure control, adjustment of the mA and/or kV according to patient size and/or use of iterative reconstruction technique.   CONTRAST:  OMNIPAQUE  IOHEXOL  350 MG/ML SOLN   COMPARISON:  CT angiogram of the abdomen and pelvis performed September 10, 2021   FINDINGS: VASCULAR   Aorta: Mild diffuse atherosclerotic changes are present in the proximal abdominal aorta. There are postsurgical changes in the infrarenal segment from aortobifemoral bypass. Minimal luminal diameter of the  native segment is 9 mm. Minimal luminal diameter of the surgical bypass graft is 15 mm.   Celiac: Focal moderate stenosis in the proximal celiac artery secondary to irregular calcified plaque.   SMA: Mild atherosclerotic changes in the proximal segment, patent.   Renals: Mild atherosclerotic changes bilaterally, patent.   IMA: The native origin is likely occluded with proximal branch reconstitution.   Inflow: The native right common iliac artery is occluded. There is reconstitution of the native right external and internal iliac arteries. The right surgical graft limb is widely patent with minimal diameter of 7 mm.   The the native left common iliac artery is occluded. The left internal and external iliac arteries are reconstituted via collaterals. The left surgical graft limb is widely patent with minimal diameter estimated at 6 mm.   Proximal Outflow: Patent.   Veins: There is mild compression of the native left  common iliac vein by the overlying right common iliac artery.   Review of the MIP images confirms the above findings.   NON-VASCULAR   Lower chest: Reported separately.   Hepatobiliary: The gallbladder is not clearly seen and may be surgically absent.   Pancreas: No acute change.  Mild pancreatic atrophy.   Spleen: Nothing significant.   Adrenals/Urinary Tract: Cortical thinning is present in both kidneys. The adrenal glands are grossly unremarkable. Detailed evaluation of the urinary bladder is limited by beam hardening artifact.   A simple appearing left lower pole renal cyst is present measuring 2.4 cm.   Stomach/Bowel: No dilated loops of bowel are appreciated. A moderate volume of stool material is present in the colon.   Lymphatic: Nothing significant.   Reproductive: Not well seen.   Other: Beam hardening artifact from bilateral hip arthroplasty.   Musculoskeletal: Degenerative changes are present in the imaged osseous structures.   IMPRESSION: 1. Postsurgical changes from aortobifemoral bypass graft placement with patent surgical limbs. The native infrarenal abdominal aorta and common iliac arteries are occluded. The native external and internal iliac arteries are patent which is a consideration given possibility of transfemoral intervention.     Electronically Signed   By: Maude Naegeli M.D.   On: 08/14/2024 11:13            Impression:   This 83 year old gentleman has stage D, severe, symptomatic, paradoxical low-flow/low gradient aortic stenosis with NYHA class II symptoms of exertional fatigue and shortness of breath consistent with chronic diastolic congestive heart failure.  He has  also been having episodes of dizziness, lower extremity edema, and reduced appetite with weight loss.  I have personally reviewed his 2D echocardiogram, cardiac catheterization, and CTA studies.  His echo shows a severely calcified aortic valve with a low mean gradient of  10 mmHg, aortic valve area of 0.75 cm, dimensionless index 0.24, and low stroke-volume index of 26.  Left ventricular ejection fraction is 50 to 55% with severe concentric LVH concerning for possible amyloid.  Cardiac MRI was not possible due to his implanted Barostim device.  Cardiac catheterization showed a patent LIMA to the LAD with a 50% ostial stenosis.  There is severe native three-vessel disease with 90% restenosis within the previously placed left circumflex stent.  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  I agree that aortic valve replacement is indicated in this patient for relief of his symptoms and to prevent further left ventricular dysfunction.  Given his age, comorbidities and prior coronary bypass surgery I do not think he is a candidate for redo sternotomy  and open surgical AVR.  I think transcatheter aortic valve replacement be a reasonable alternative for treating him.  His gated cardiac CTA shows anatomy suitable for TAVR using a 26 mm SAPIEN 3 valve.  He has an aortobifemoral bypass graft in place but appears to transfemoral access.   The patient and his family were counseled at length regarding treatment alternatives for management of severe symptomatic aortic stenosis. The risks and benefits of surgical intervention has been discussed in detail. Long-term prognosis with medical therapy was discussed. Alternative approaches such as conventional surgical aortic valve replacement, transcatheter aortic valve replacement, and palliative medical therapy were compared and contrasted at length. This discussion was placed in the context of the patient's own specific clinical presentation and past medical history. All of their questions have been addressed.    Following the decision to proceed with transcatheter aortic valve replacement, a discussion was held regarding what types of management strategies would be attempted intraoperatively in the event of life-threatening complications,  including whether or not the patient would be considered a candidate for the use of cardiopulmonary bypass and/or conversion to open sternotomy for attempted surgical intervention.  Given his age, prior CABG surgery, and other comorbidities I do not think he is a candidate for emergent sternotomy to manage any intraoperative complications.  The patient has been advised of a variety of complications that might develop including but not limited to risks of death, stroke, paravalvular leak, aortic dissection or other major vascular complications, aortic annulus rupture, device embolization, cardiac rupture or perforation, mitral regurgitation, acute myocardial infarction, arrhythmia, heart block or bradycardia requiring permanent pacemaker placement, congestive heart failure, respiratory failure, renal failure, pneumonia, infection, other late complications related to structural valve deterioration or migration, or other complications that might ultimately cause a temporary or permanent loss of functional independence or other long term morbidity. The patient provides full informed consent for the procedure as described and all questions were answered.       Plan:   Transfemoral TAVR using a SAPIEN 3 valve.    Dorise LOIS Fellers, MD

## 2024-09-04 NOTE — Progress Notes (Signed)
 Called and spoke to his wife, Estela, and informed her the patient is to arrive at 0700 for a 0930 surgical start time. She stated the office had previously called to inform them.

## 2024-09-05 ENCOUNTER — Other Ambulatory Visit: Payer: Self-pay

## 2024-09-05 ENCOUNTER — Inpatient Hospital Stay (HOSPITAL_COMMUNITY)
Admission: RE | Admit: 2024-09-05 | Discharge: 2024-09-06 | DRG: 267 | Disposition: A | Discharge status: Home / Self Care | Attending: Surgery | Admitting: Surgery

## 2024-09-05 ENCOUNTER — Inpatient Hospital Stay (HOSPITAL_COMMUNITY)
Admission: RE | Disposition: A | Discharge status: Home / Self Care | Payer: Self-pay | Source: Home / Self Care | Attending: Surgery

## 2024-09-05 ENCOUNTER — Inpatient Hospital Stay (HOSPITAL_COMMUNITY): Payer: Self-pay | Admitting: Certified Registered Nurse Anesthetist

## 2024-09-05 ENCOUNTER — Inpatient Hospital Stay (HOSPITAL_COMMUNITY)

## 2024-09-05 ENCOUNTER — Encounter (HOSPITAL_COMMUNITY): Payer: Self-pay | Admitting: Internal Medicine

## 2024-09-05 DIAGNOSIS — I739 Peripheral vascular disease, unspecified: Secondary | ICD-10-CM | POA: Diagnosis not present

## 2024-09-05 DIAGNOSIS — Z87442 Personal history of urinary calculi: Secondary | ICD-10-CM

## 2024-09-05 DIAGNOSIS — Z860100 Personal history of colon polyps, unspecified: Secondary | ICD-10-CM

## 2024-09-05 DIAGNOSIS — Z885 Allergy status to narcotic agent status: Secondary | ICD-10-CM

## 2024-09-05 DIAGNOSIS — Z8616 Personal history of COVID-19: Secondary | ICD-10-CM

## 2024-09-05 DIAGNOSIS — Z87891 Personal history of nicotine dependence: Secondary | ICD-10-CM

## 2024-09-05 DIAGNOSIS — I350 Nonrheumatic aortic (valve) stenosis: Secondary | ICD-10-CM

## 2024-09-05 DIAGNOSIS — Z8249 Family history of ischemic heart disease and other diseases of the circulatory system: Secondary | ICD-10-CM

## 2024-09-05 DIAGNOSIS — I5042 Chronic combined systolic (congestive) and diastolic (congestive) heart failure: Secondary | ICD-10-CM

## 2024-09-05 DIAGNOSIS — Z95810 Presence of automatic (implantable) cardiac defibrillator: Secondary | ICD-10-CM | POA: Diagnosis not present

## 2024-09-05 DIAGNOSIS — Z79890 Hormone replacement therapy (postmenopausal): Secondary | ICD-10-CM | POA: Diagnosis not present

## 2024-09-05 DIAGNOSIS — Z952 Presence of prosthetic heart valve: Principal | ICD-10-CM

## 2024-09-05 DIAGNOSIS — N1831 Chronic kidney disease, stage 3a: Secondary | ICD-10-CM | POA: Diagnosis not present

## 2024-09-05 DIAGNOSIS — I4819 Other persistent atrial fibrillation: Secondary | ICD-10-CM | POA: Diagnosis present

## 2024-09-05 DIAGNOSIS — M48061 Spinal stenosis, lumbar region without neurogenic claudication: Secondary | ICD-10-CM | POA: Diagnosis present

## 2024-09-05 DIAGNOSIS — I2510 Atherosclerotic heart disease of native coronary artery without angina pectoris: Secondary | ICD-10-CM | POA: Diagnosis present

## 2024-09-05 DIAGNOSIS — Z955 Presence of coronary angioplasty implant and graft: Secondary | ICD-10-CM

## 2024-09-05 DIAGNOSIS — I110 Hypertensive heart disease with heart failure: Secondary | ICD-10-CM

## 2024-09-05 DIAGNOSIS — Z951 Presence of aortocoronary bypass graft: Secondary | ICD-10-CM

## 2024-09-05 DIAGNOSIS — Z79899 Other long term (current) drug therapy: Secondary | ICD-10-CM

## 2024-09-05 DIAGNOSIS — E785 Hyperlipidemia, unspecified: Secondary | ICD-10-CM | POA: Diagnosis not present

## 2024-09-05 DIAGNOSIS — Z96643 Presence of artificial hip joint, bilateral: Secondary | ICD-10-CM | POA: Diagnosis not present

## 2024-09-05 DIAGNOSIS — Z886 Allergy status to analgesic agent status: Secondary | ICD-10-CM

## 2024-09-05 DIAGNOSIS — I951 Orthostatic hypotension: Secondary | ICD-10-CM | POA: Diagnosis not present

## 2024-09-05 DIAGNOSIS — I25810 Atherosclerosis of coronary artery bypass graft(s) without angina pectoris: Secondary | ICD-10-CM

## 2024-09-05 DIAGNOSIS — E039 Hypothyroidism, unspecified: Secondary | ICD-10-CM | POA: Diagnosis not present

## 2024-09-05 DIAGNOSIS — K219 Gastro-esophageal reflux disease without esophagitis: Secondary | ICD-10-CM | POA: Diagnosis present

## 2024-09-05 DIAGNOSIS — Z006 Encounter for examination for normal comparison and control in clinical research program: Secondary | ICD-10-CM | POA: Diagnosis not present

## 2024-09-05 DIAGNOSIS — I255 Ischemic cardiomyopathy: Secondary | ICD-10-CM | POA: Diagnosis not present

## 2024-09-05 DIAGNOSIS — I252 Old myocardial infarction: Secondary | ICD-10-CM

## 2024-09-05 DIAGNOSIS — N401 Enlarged prostate with lower urinary tract symptoms: Secondary | ICD-10-CM | POA: Diagnosis not present

## 2024-09-05 DIAGNOSIS — Z7901 Long term (current) use of anticoagulants: Secondary | ICD-10-CM | POA: Diagnosis not present

## 2024-09-05 DIAGNOSIS — I130 Hypertensive heart and chronic kidney disease with heart failure and stage 1 through stage 4 chronic kidney disease, or unspecified chronic kidney disease: Secondary | ICD-10-CM | POA: Diagnosis not present

## 2024-09-05 DIAGNOSIS — R338 Other retention of urine: Secondary | ICD-10-CM | POA: Diagnosis present

## 2024-09-05 DIAGNOSIS — Z833 Family history of diabetes mellitus: Secondary | ICD-10-CM | POA: Diagnosis not present

## 2024-09-05 DIAGNOSIS — Z5181 Encounter for therapeutic drug level monitoring: Secondary | ICD-10-CM

## 2024-09-05 DIAGNOSIS — Z881 Allergy status to other antibiotic agents status: Secondary | ICD-10-CM

## 2024-09-05 DIAGNOSIS — I10 Essential (primary) hypertension: Secondary | ICD-10-CM | POA: Diagnosis present

## 2024-09-05 DIAGNOSIS — I5043 Acute on chronic combined systolic (congestive) and diastolic (congestive) heart failure: Secondary | ICD-10-CM | POA: Diagnosis present

## 2024-09-05 DIAGNOSIS — Z555 Less than a high school diploma: Secondary | ICD-10-CM

## 2024-09-05 DIAGNOSIS — Z888 Allergy status to other drugs, medicaments and biological substances status: Secondary | ICD-10-CM

## 2024-09-05 HISTORY — PX: INTRAOPERATIVE TRANSTHORACIC ECHOCARDIOGRAM: SHX6523

## 2024-09-05 LAB — ECHOCARDIOGRAM LIMITED
AR max vel: 1.13 cm2
AV Area VTI: 1.13 cm2
AV Area mean vel: 1.03 cm2
AV Mean grad: 9 mmHg
AV Peak grad: 13.7 mmHg
Ao pk vel: 1.85 m/s

## 2024-09-05 LAB — POCT I-STAT, CHEM 8
BUN: 15 mg/dL (ref 8–23)
Calcium, Ion: 1.2 mmol/L (ref 1.15–1.40)
Chloride: 107 mmol/L (ref 98–111)
Creatinine, Ser: 1.1 mg/dL (ref 0.61–1.24)
Glucose, Bld: 147 mg/dL — ABNORMAL HIGH (ref 70–99)
HCT: 30 % — ABNORMAL LOW (ref 39.0–52.0)
Hemoglobin: 10.2 g/dL — ABNORMAL LOW (ref 13.0–17.0)
Potassium: 3.7 mmol/L (ref 3.5–5.1)
Sodium: 141 mmol/L (ref 135–145)
TCO2: 21 mmol/L — ABNORMAL LOW (ref 22–32)

## 2024-09-05 LAB — POCT ACTIVATED CLOTTING TIME
Activated Clotting Time: 106 s
Activated Clotting Time: 245 s
Activated Clotting Time: 250 s

## 2024-09-05 MED ORDER — DIGOXIN 125 MCG PO TABS: 0.0625 mg | ORAL_TABLET | ORAL | Status: DC

## 2024-09-05 MED ORDER — CEFAZOLIN SODIUM-DEXTROSE 2-4 GM/100ML-% IV SOLN
2.0000 g | Freq: Three times a day (TID) | INTRAVENOUS | Status: AC
  Administered 2024-09-05 (×2): 2 g via INTRAVENOUS
  Filled 2024-09-05 (×2): qty 100

## 2024-09-05 MED ORDER — SODIUM CHLORIDE 0.9 % IV SOLN: 250.0000 mL | INTRAVENOUS | Status: DC | PRN

## 2024-09-05 MED ORDER — LIDOCAINE HCL (PF) 1 % IJ SOLN
INTRAMUSCULAR | Status: AC
  Filled 2024-09-05: qty 30

## 2024-09-05 MED ORDER — METHOCARBAMOL 500 MG PO TABS: 500.0000 mg | ORAL_TABLET | Freq: Every day | ORAL | Status: DC | PRN

## 2024-09-05 MED ORDER — CHLORHEXIDINE GLUCONATE 0.12 % MT SOLN
15.0000 mL | Freq: Once | OROMUCOSAL | Status: AC
  Administered 2024-09-05: 15 mL via OROMUCOSAL
  Filled 2024-09-05: qty 15

## 2024-09-05 MED ORDER — ALPRAZOLAM 0.5 MG PO TABS: 0.5000 mg | ORAL_TABLET | Freq: Three times a day (TID) | ORAL | Status: DC | PRN

## 2024-09-05 MED ORDER — CHLORHEXIDINE GLUCONATE 4 % EX SOLN: 60.0000 mL | Freq: Once | CUTANEOUS | Status: DC

## 2024-09-05 MED ORDER — HEPARIN SODIUM (PORCINE) 1000 UNIT/ML IJ SOLN
INTRAMUSCULAR | Status: DC | PRN
  Administered 2024-09-05: 1000 [IU] via INTRAVENOUS
  Administered 2024-09-05: 10000 [IU] via INTRAVENOUS

## 2024-09-05 MED ORDER — OXYCODONE HCL 5 MG PO TABS: 5.0000 mg | ORAL_TABLET | ORAL | Status: DC | PRN

## 2024-09-05 MED ORDER — ACETAMINOPHEN 650 MG RE SUPP: 650.0000 mg | Freq: Four times a day (QID) | RECTAL | Status: DC | PRN

## 2024-09-05 MED ORDER — DIGOXIN 125 MCG PO TABS
0.0625 mg | ORAL_TABLET | ORAL | Status: DC
  Administered 2024-09-06: 0.0625 mg via ORAL
  Filled 2024-09-05: qty 1

## 2024-09-05 MED ORDER — HEPARIN (PORCINE) IN NACL 1000-0.9 UT/500ML-% IV SOLN
INTRAVENOUS | Status: DC | PRN
  Administered 2024-09-05: 1500 mL via SURGICAL_CAVITY

## 2024-09-05 MED ORDER — LIDOCAINE HCL (PF) 1 % IJ SOLN
INTRAMUSCULAR | Status: DC | PRN
  Administered 2024-09-05: 20 mL

## 2024-09-05 MED ORDER — BISACODYL 5 MG PO TBEC
10.0000 mg | DELAYED_RELEASE_TABLET | Freq: Every day | ORAL | Status: DC
  Administered 2024-09-05: 10 mg via ORAL
  Filled 2024-09-05: qty 2

## 2024-09-05 MED ORDER — SODIUM CHLORIDE 0.9 % IV SOLN: INTRAVENOUS | Status: DC

## 2024-09-05 MED ORDER — SACUBITRIL-VALSARTAN 24-26 MG PO TABS
1.0000 | ORAL_TABLET | Freq: Two times a day (BID) | ORAL | Status: DC
  Administered 2024-09-05 – 2024-09-06 (×2): 1 via ORAL
  Filled 2024-09-05 (×2): qty 1

## 2024-09-05 MED ORDER — SODIUM CHLORIDE 0.9% FLUSH
3.0000 mL | Freq: Two times a day (BID) | INTRAVENOUS | Status: DC
  Administered 2024-09-05 – 2024-09-06 (×2): 3 mL via INTRAVENOUS

## 2024-09-05 MED ORDER — FENTANYL CITRATE (PF) 100 MCG/2ML IJ SOLN
INTRAMUSCULAR | Status: AC
  Filled 2024-09-05: qty 2

## 2024-09-05 MED ORDER — IODIXANOL 320 MG/ML IV SOLN
INTRAVENOUS | Status: DC | PRN
  Administered 2024-09-05: 70 mL via INTRA_ARTERIAL

## 2024-09-05 MED ORDER — AMIODARONE HCL 200 MG PO TABS
200.0000 mg | ORAL_TABLET | Freq: Every day | ORAL | Status: DC
  Administered 2024-09-05 – 2024-09-06 (×2): 200 mg via ORAL
  Filled 2024-09-05 (×2): qty 1

## 2024-09-05 MED ORDER — OXYCODONE HCL 5 MG PO TABS
5.0000 mg | ORAL_TABLET | Freq: Four times a day (QID) | ORAL | Status: DC | PRN
  Filled 2024-09-05: qty 1

## 2024-09-05 MED ORDER — SODIUM CHLORIDE 0.9 % IV SOLN: INTRAVENOUS | Status: DC | PRN

## 2024-09-05 MED ORDER — FENTANYL CITRATE (PF) 100 MCG/2ML IJ SOLN
INTRAMUSCULAR | Status: DC | PRN
  Administered 2024-09-05 (×2): 25 ug via INTRAVENOUS

## 2024-09-05 MED ORDER — SODIUM CHLORIDE 0.9 % IV SOLN: INTRAVENOUS | Status: AC

## 2024-09-05 MED ORDER — MIRTAZAPINE 15 MG PO TABS
15.0000 mg | ORAL_TABLET | Freq: Every day | ORAL | Status: DC
  Administered 2024-09-05: 15 mg via ORAL
  Filled 2024-09-05: qty 1

## 2024-09-05 MED ORDER — SODIUM CHLORIDE 0.9% FLUSH: 3.0000 mL | INTRAVENOUS | Status: DC | PRN

## 2024-09-05 MED ORDER — PROPOFOL 500 MG/50ML IV EMUL
INTRAVENOUS | Status: DC | PRN
Start: 2024-09-05 — End: 2024-09-05
  Administered 2024-09-05: 30 ug/kg/min via INTRAVENOUS

## 2024-09-05 MED ORDER — ACETAMINOPHEN 325 MG PO TABS: 650.0000 mg | ORAL_TABLET | Freq: Four times a day (QID) | ORAL | Status: DC | PRN

## 2024-09-05 MED ORDER — CHLORHEXIDINE GLUCONATE CLOTH 2 % EX PADS: 6.0000 | MEDICATED_PAD | Freq: Every day | CUTANEOUS | Status: DC

## 2024-09-05 MED ORDER — PANTOPRAZOLE SODIUM 40 MG PO TBEC
40.0000 mg | DELAYED_RELEASE_TABLET | Freq: Every day | ORAL | Status: DC
  Administered 2024-09-06: 40 mg via ORAL
  Filled 2024-09-05: qty 1

## 2024-09-05 MED ORDER — NOREPINEPHRINE BITARTRATE 1 MG/ML IV SOLN
INTRAVENOUS | Status: DC | PRN
  Administered 2024-09-05: .5 mL via INTRAVENOUS

## 2024-09-05 MED ORDER — PROTAMINE SULFATE 10 MG/ML IV SOLN
INTRAVENOUS | Status: DC | PRN
  Administered 2024-09-05: 70 mg via INTRAVENOUS

## 2024-09-05 MED ORDER — NITROGLYCERIN IN D5W 200-5 MCG/ML-% IV SOLN
0.0000 ug/min | INTRAVENOUS | Status: DC
  Administered 2024-09-05: 5 ug/min via INTRAVENOUS
  Filled 2024-09-05: qty 250

## 2024-09-05 MED ORDER — LEVOTHYROXINE SODIUM 50 MCG PO TABS
50.0000 ug | ORAL_TABLET | Freq: Every day | ORAL | Status: DC
  Administered 2024-09-06: 50 ug via ORAL
  Filled 2024-09-05: qty 1

## 2024-09-05 MED ORDER — CHLORHEXIDINE GLUCONATE 4 % EX SOLN: 30.0000 mL | CUTANEOUS | Status: DC

## 2024-09-05 MED ORDER — ALBUMIN HUMAN 5 % IV SOLN: INTRAVENOUS | Status: DC | PRN

## 2024-09-05 SURGICAL SUPPLY — 14 items
CABLE ADAPT PACING TEMP 12FT (ADAPTER) IMPLANT
CATH DIAG 6FR PIGTAIL ANGLED (CATHETERS) IMPLANT
CATH INFINITI 5FR ANG PIGTAIL (CATHETERS) IMPLANT
CATH INFINITI 6F AL1 (CATHETERS) IMPLANT
CATH S G BIP PACING (CATHETERS) IMPLANT
ELECT DEFIB PAD ADLT CADENCE (PAD) IMPLANT
SHEATH PINNACLE 8F 10CM (SHEATH) IMPLANT
SHEATH PROBE COVER 6X72 (BAG) IMPLANT
SHIELD CATH-GARD CONTAMINATION (MISCELLANEOUS) IMPLANT
WIRE AMPLATZ SS-J .035X180CM (WIRE) IMPLANT
WIRE EMERALD 3MM-J .035X150CM (WIRE) IMPLANT
WIRE EMERALD 3MM-J .035X260CM (WIRE) IMPLANT
WIRE EMERALD ST .035X260CM (WIRE) IMPLANT
WIRE SAFARI SM CURVE 275 (WIRE) IMPLANT

## 2024-09-05 NOTE — Discharge Instructions (Signed)

## 2024-09-05 NOTE — Anesthesia Postprocedure Evaluation (Signed)
 Anesthesia Post Note  Patient: Ryan [name]  Procedure(s) Performed: Transcatheter Aortic Valve Replacement, Transfemoral (Right) ECHOCARDIOGRAM, TRANSTHORACIC     Patient location during evaluation: PACU Anesthesia Type: MAC Level of consciousness: awake and alert Pain management: pain level controlled Vital Signs Assessment: post-procedure vital signs reviewed and stable Respiratory status: spontaneous breathing, nonlabored ventilation, respiratory function stable and patient connected to nasal cannula oxygen Cardiovascular status: stable and blood pressure returned to baseline Postop Assessment: no apparent nausea or vomiting Anesthetic complications: no   There were no known notable events for this encounter.  Last Vitals:  Vitals:   09/05/24 1504 09/05/24 1515  BP: (!) 166/69 (!) 156/65  Pulse:  (!) 57  Resp: 18 19  Temp:    SpO2: 98%     Last Pain:  Vitals:   09/05/24 1155  TempSrc: Temporal  PainSc: (P) 0-No pain                 Lynwood MARLA Cornea

## 2024-09-05 NOTE — Discharge Summary (Incomplete)
 HEART AND VASCULAR CENTER   MULTIDISCIPLINARY HEART VALVE TEAM  Discharge Summary    Patient ID: Ryan [name] MRN: [id number]; DOB: [birth year]  Admit date: 09/05/2024 Discharge date: 09/06/2024  PCP:  [name], MD  [facility] Cardiologist:  Ezra Shuck, MD  [facility] Structural heart: Lurena MARLA Red, MD [facility] Electrophysiologist:  Danelle Birmingham, MD   Discharge Diagnoses    Principal Problem:   S/P TAVR (transcatheter aortic valve replacement) Active Problems:   Coronary artery disease   Spinal stenosis, lumbar   HTN (hypertension)   S/P CABG x 4   PAD (peripheral artery disease) (HCC)   Hyperlipidemia LDL goal <70   Chronic combined systolic and diastolic heart failure (HCC)   Persistent atrial fibrillation (HCC)   ICD (implantable cardioverter-defibrillator) in place   Orthostatic hypotension   CAD (coronary artery disease)   Allergies Allergies  Allergen Reactions   Jardiance  [Empagliflozin ] Nausea Only and Other (See Comments)    Made patient very sick to the stomach.   Zetia  [Ezetimibe ] Other (See Comments)    Myalgias    Ace Inhibitors Cough    Other reaction(s): cough   Aspirin  Other (See Comments)    skin rash/easy bruising   Crestor  [Rosuvastatin ] Other (See Comments)    Myalgias/weakness   Lipitor  [Atorvastatin ] Other (See Comments)    weakness, fatigue (severe)   Vytorin [Ezetimibe -Simvastatin] Other (See Comments)    myalgias   Codeine  Nausea And Vomiting         Unithroid  [Levothyroxine  Sodium] Other (See Comments)    Caused blurry vision per pt    Diagnostic Studies/Procedures    HEART AND VASCULAR CENTER  TAVR OPERATIVE NOTE     Date of Procedure:                09/05/2024   Preoperative Diagnosis:      Severe Aortic Stenosis    Postoperative Diagnosis:    Same    Procedure:        Transcatheter Aortic Valve Replacement - Transfemoral Approach             Edwards Sapien 3 Resilia THV (size 26 mm,  model # [id number], serial #)              Co-Surgeons:                        Dorise LOIS Fellers, MD, Con Clunes, MD and Lurena Red, MD Anesthesiologist:                  Keneth   Echocardiographer:              Delford   Pre-operative Echo Findings: Severe aortic stenosis Mild left ventricular systolic dysfunction   Post-operative Echo Findings: No paravalvular leak Mild left ventricular systolic dysfunction   Left Heart Catheterization Findings: Left ventricular end-diastolic pressure of 16 mmHg  _____________    Echo 09/06/24:  IMPRESSIONS  1. Akinesis of the basal anteroseptal, inferoseptal, and inferior  myocardium. There is hypokinsis of the mid inferior myocardium. Compared  with echo 09/05/24 and 03/28/24, wall motion abnormalities were present at  that time but slightlty worse now. Left  ventricular ejection fraction, by estimation, is 45 to 50%. Left  ventricular ejection fraction by PLAX is 45 %. The left ventricle has  mildly decreased function. The left ventricle demonstrates regional wall  motion abnormalities (see scoring  diagram/findings for description). There is severe asymmetric left  ventricular hypertrophy. Left ventricular diastolic parameters are  consistent with Grade II diastolic dysfunction (pseudonormalization).   2. Right ventricular systolic function is normal. The right ventricular  size is normal. There is normal pulmonary artery systolic pressure.   3. Left atrial size was mildly dilated.   4. The mitral valve is normal in structure. Trivial mitral valve  regurgitation. No evidence of mitral stenosis.   5. The aortic valve has been repaired/replaced. Aortic valve  regurgitation is not visualized. No aortic stenosis is present. Echo  findings are consistent with normal structure and function of the aortic  valve prosthesis.   6. The inferior vena cava is normal in size with greater than 50%  respiratory variability, suggesting right atrial pressure of  3 mmHg.   History of Present Illness     Ryan [name] is a 83 y.o. 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   Echo in 07/2023 showed EF 25-30%, moderate LVH, severe LV dilation, mild aortic stenosis, mild MR. S/p Barostim in 02/2024. He developed worsening exertional fatigue and shortness of breath and repeat TTE 03/28/2024 showed EF 50% and severe LFLG AS with a mean grad  of only 10 mmHg, but low AVA 0.75 cm2, DVI 0.24, SVI 26. Noted to have severe concentric LVH concerning for amyloid. Cardiac MRI was not possible due to his implanted Barostim device. [facility] 07/27/24 showed a patent LIMA to the LAD with a 50% ostial stenosis.  There is severe native three-vessel disease with 90% restenosis within the previously placed left circumflex stent. 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 Cardiac CT showed patent aortobifemoral bypass grafts and he was felt to have transfemoral access through the bypass.    The patient was evaluated by the multidisciplinary valve team and felt to have severe, symptomatic aortic stenosis and to be a suitable candidate for TAVR, which was set up for 09/05/24.   Hospital Course     Consultants: none   Severe AS:  -- S/p successful TAVR with a 26 mm Edwards Sapien 3 Ultra Resilia THV via the TF approach on 09/05/24.  -- Post operative echo showed EF 45%, WMAs, severe asymmetric LVH, normally functioning TAVR with a mean gradient of 4 mmHg and no PVL. -- Groin sites are stable.  -- ECG with sinus/old first degree AV block but no HAVB. Barostim artifact  noted.  -- Continue Eliquis  5mg  BID.  -- Met with cardiac rehab to discuss CRP phase II. -- I discussed SBE prophylaxis with him and will send in as needed amoxicillin .  -- Plan for discharge home today with close follow up in the outpatient setting (has a previously scheduled apt with CHF next week).   HFmrEF: -- Echo today showed EF 45-50% with significant WMAs.  -- Appears euvolemic. LVEDP 16 mmhg at the time of TAVR.  -- S/p Barostim (02/2024). -- Continue digoxin  0.0625 mg. -- I restarted Entresto  24-26mg  BID (previously discontinue due to low BPs).  -- Decrease torsemide  40mg  --> 20 mg daily with adding back Entresto .  -- Pt's  wife reported not taking potassium supplement at home, DC;d at discharge.  -- He failed both Jardiance  and Farxiga  due to nausea. -- Bisoprolol  previously discontinued due to bradycardia.  Severe LVH: -- Cononcerning for amyloid.  -- Cardiac MRI not possible due to his implanted Barostim device. -- Plan outpatient PYP, SPEP/UPEP through advanced CHF clinic.   CAD s/p CABG: -- [facility] 07/27/24 showed a patent LIMA to the LAD with a 50% ostial stenosis.  There is severe native three-vessel disease with 90% restenosis within the previously placed left circumflex stent. 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 -- Continue medical therapy.  -- No aspirin  given Eliquis  use.  -- Continue Repatha  140mg  q 2 weeks (statin intolerant).   CKD stage IIIa: -- Creat baseline 1.2-1.7 -- Creat 1.26 at discharge.   PAD:  -- S/p LCEA 2017. -- S/p aortobifemoral bypass 1987. -- Continue medical therapy.   ICM/VT s/p ICD (2022): -- Continue amiodarone  200mg  daily.  -- BB discontinued with bradycardia.   Paroxysmal atrial fibrillation: -- Continue amiodarone  200mg  daily. (had previously been reduced to 100mg  daily, but wife reported he is on 200 and HRs have been stable in 70s).  -- BB discontinued with bradycardia.  -- Continue Eliquis  5mg  BID.   Chronic urinary  retention: -- Pt required I/O cathing during admission, which he does at home.  _____________  Discharge Vitals Blood pressure (!) 109/51, pulse 70, temperature (!) 97.4 F (36.3 C), temperature source Oral, resp. rate (!) 24, height 5' 5.5 (1.664 m), weight 61.7 kg, SpO2 97%.  Filed Weights   09/04/24 1000 09/05/24 0649 09/06/24 0125  Weight: 54.9 kg 55.8 kg 61.7 kg     GEN: Well nourished, well developed in no acute distress NECK: No JVD CARDIAC: RRR, no murmurs, rubs, gallops RESPIRATORY:  Clear to auscultation without rales, wheezing or rhonchi  ABDOMEN: Soft, non-tender, non-distended EXTREMITIES:  No edema; No deformity.  Groin sites clear without hematoma or ecchymosis.    Disposition   Pt is being discharged home today in good condition.  Follow-up Plans & Appointments     Follow-up Information     [facility]. Go on 09/12/2024.   Specialty: Cardiology Why: @ 3pm, please arrive at least 15 minutes early. Contact information: [address] [place]  [phone]               Discharge Instructions     Amb Referral to Cardiac Rehabilitation   Complete by: As directed    Diagnosis: Valve Replacement   Valve: Aortic Comment - TAVR   After initial evaluation and assessments completed: Virtual Based Care may be provided alone or in conjunction with Phase 2 Cardiac Rehab based on patient barriers.: Yes   Intensive Cardiac Rehabilitation (ICR) [facility] location only OR Traditional Cardiac Rehabilitation (TCR) *If criteria for ICR are not met will enroll in TCR [facility] only): Yes       Discharge Medications   Allergies as of 09/06/2024       Reactions   Jardiance  [empagliflozin ] Nausea Only, Other (See Comments)   Made patient very sick to the stomach.   Zetia  [ezetimibe ] Other (See Comments)   Myalgias    Ace Inhibitors Cough   Other reaction(s): cough   Aspirin  Other (See Comments)    skin rash/easy bruising   Crestor  [rosuvastatin ] Other (See Comments)   Myalgias/weakness   Lipitor  [atorvastatin ] Other (See Comments)   weakness, fatigue (severe)   Vytorin [ezetimibe -simvastatin]  Other (See Comments)   myalgias   Codeine  Nausea And Vomiting       Unithroid  [levothyroxine  Sodium] Other (See Comments)   Caused blurry vision per pt        Medication List     STOP taking these medications    potassium chloride  10 MEQ tablet Commonly known as: KLOR-CON  M       TAKE these medications    ALPRAZolam  0.5 MG tablet Commonly known as: XANAX  Take 0.5 mg by mouth 3 (three) times daily as needed for anxiety or sleep.   amiodarone  200 MG tablet Commonly known as: PACERONE  Take 200 mg by mouth daily. What changed: Another medication with the same name was removed. Continue taking this medication, and follow the directions you see here.   amoxicillin  500 MG tablet Commonly known as: AMOXIL  Take 4 tablets (2,000 mg total) by mouth as directed. Please take 4 tablets 1 hour prior to dental work, including cleanings   apixaban  5 MG Tabs tablet Commonly known as: ELIQUIS  Take 5 mg by mouth 2 (two) times daily.   bisacodyl  5 MG EC tablet Commonly known as: DULCOLAX Take 10 mg by mouth at bedtime.   colchicine  0.6 MG tablet Take 1 tablet (0.6 mg total) by mouth 2 (two) times daily. What changed:  when to take this reasons to take this   digoxin  0.125 MG tablet Commonly known as: Lanoxin  Take 0.5 tablets (0.0625 mg total) by mouth every other day.   Entresto  24-26 MG Generic drug: sacubitril -valsartan  Take 1 tablet by mouth 2 (two) times daily.   isosorbide  mononitrate 30 MG 24 hr tablet Commonly known as: IMDUR  Take 1 tablet (30 mg total) by mouth daily.   levothyroxine  50 MCG tablet Commonly known as: SYNTHROID  Take 1 tablet (50 mcg total) by mouth daily before breakfast. PLEASE SCHEDULE APPOINTMENT FOR MORE REFILLS   meclizine  12.5 MG  tablet Commonly known as: ANTIVERT  TAKE 2 TABLETS BY MOUTH 3 TIMES A DAY AS NEEDED **FURTHER REFILLS NEED TO BE PRESCRIBED BY PRIMARY CARE PHYSICIAN** What changed: See the new instructions.   methocarbamol  500 MG tablet Commonly known as: ROBAXIN  Take 500 mg by mouth daily as needed for muscle spasms (back pain.).   mirtazapine  15 MG tablet Commonly known as: REMERON  Take 15 mg by mouth at bedtime.   nitroGLYCERIN  0.4 MG SL tablet Commonly known as: NITROSTAT  Place 1 tablet (0.4 mg total) under the tongue every 5 (five) minutes as needed for chest pain.   ondansetron  4 MG tablet Commonly known as: ZOFRAN  Take 4 mg by mouth every 8 (eight) hours as needed for nausea or vomiting.   oxyCODONE  5 MG immediate release tablet Commonly known as: Roxicodone  Take 1 tablet (5 mg total) by mouth every 6 (six) hours as needed.   pantoprazole  40 MG tablet Commonly known as: PROTONIX  Take 1 tablet (40 mg total) by mouth daily. What changed:  when to take this reasons to take this   polyethylene glycol powder 17 GM/SCOOP powder Commonly known as: GLYCOLAX /MIRALAX  Take 17 g by mouth at bedtime.   Repatha  SureClick 140 MG/ML Soaj Generic drug: Evolocumab  Inject 140 mg into the skin every 14 (fourteen) days.   tobramycin  0.3 % ophthalmic solution Commonly known as: TOBREX  Place 1 drop into the left eye daily as needed (Dry eyes).   torsemide  20 MG tablet Commonly known as: DEMADEX  Take 1 tablet (20 mg total) by mouth daily. What changed: how much to take  Outstanding Labs/Studies   SPEP/UPEP/PYP/BMET ______________________  Duration of Discharge Encounter: APP Time: 31 minutes    Signed, Lamarr Hummer, PA-C 09/06/2024, 11:35 AM [phone]    Chart reviewed, patient examined, agree with above.  He feels well and has been up ambulating. Had foley inserted postop for urinary retention laying in bed but he says he has had longstanding problems with this and  intermittently does self caths at home. Would remove foley this am and see if he can urinate. His groin sites look fine. Rhythm has been sinus. Echo this am reviewed. His TAVR valve looks good with mean gradient of 4 mm Hg and no paravalvular leak. Plan to send home today.

## 2024-09-05 NOTE — Progress Notes (Signed)
  HEART AND VASCULAR CENTER   MULTIDISCIPLINARY HEART VALVE TEAM  Patient doing well s/p TAVR. He is hemodynamically stable, but BP elevated 156-65.  Left groin sites stable, but right with new acute bleedin but no hematoma. RN, holding manual pressure. Bed rest extended 2 hours.Resume home Entresto  for better BP control. Will require in and out cath for chronic urinary retention. Okay for him to do himself after bedrest complete. ECG with sinus brady and old 1st deg AV block. There is no high grade block. Transferred from cath lab holding to 4E.  Early ambulation after bedrest completed and hopeful discharge over the next 24-48 hours.   Lamarr Hummer PA-C  [facility]  Pager [phone]

## 2024-09-05 NOTE — Op Note (Signed)
 HEART AND VASCULAR CENTER  TAVR OPERATIVE NOTE   Date of Procedure:  09/05/2024  Preoperative Diagnosis: Severe Aortic Stenosis   Postoperative Diagnosis: Same   Procedure:   Transcatheter Aortic Valve Replacement - Transfemoral Approach  Edwards Sapien 3 Resilia THV (size 26 mm, model # [id number], serial #)   Co-Surgeons:  Dorise LOIS Fellers, MD, Con Clunes, MD and Lurena Red, MD Anesthesiologist:  Keneth  Echocardiographer:  Delford  Pre-operative Echo Findings: Severe aortic stenosis Mild left ventricular systolic dysfunction  Post-operative Echo Findings: No paravalvular leak Mild left ventricular systolic dysfunction  Left Heart Catheterization Findings: Left ventricular end-diastolic pressure of 16 mmHg   BRIEF CLINICAL NOTE AND INDICATIONS FOR SURGERY  The patient is an 83 year old male with a history of coronary artery disease status post CABG consisting of a LIMA to LAD, vein graft to D1 and D2, vein graft to PDA, and vein graft to ramus in 2016, status post PCI of the left circumflex with high-grade in-stent restenosis and total vein graft failure on last cardiac catheterization, ischemic cardiomyopathy status post ICD, status post barrow stem, paroxysmal atrial fibrillation on Eliquis , peripheral arterial disease status post aortobifemoral bypass, left carotid endarterectomy, CKD stage IIIa, and symptomatic paradoxical low-flow low gradient aortic stenosis was referred for 26 mm SAPIEN 3 valve from the right transfemoral approach.  During the course of the patient's preoperative work up they have been evaluated comprehensively by a multidisciplinary team of specialists coordinated through the Multidisciplinary Heart Valve Clinic in the [facility].  They have been demonstrated to suffer from symptomatic severe aortic stenosis as noted above. The patient has been counseled extensively as to the relative risks and benefits of all options for the  treatment of severe aortic stenosis including long term medical therapy, conventional surgery for aortic valve replacement, and transcatheter aortic valve replacement.  The patient has been independently evaluated by Dr. Fellers with CT surgery and they are felt to be at high risk for conventional surgical aortic valve replacement. The surgeon indicated the patient would be a poor candidate for conventional surgery. Based upon review of all of the patient's preoperative diagnostic tests they are felt to be candidate for transcatheter aortic valve replacement using the transfemoral approach as an alternative to high risk conventional surgery.    Following the decision to proceed with transcatheter aortic valve replacement, a discussion has been held regarding what types of management strategies would be attempted intraoperatively in the event of life-threatening complications, including whether or not the patient would be considered a candidate for the use of cardiopulmonary bypass and/or conversion to open sternotomy for attempted surgical intervention.  The patient has been advised of a variety of complications that might develop peculiar to this approach including but not limited to risks of death, stroke, paravalvular leak, aortic dissection or other major vascular complications, aortic annulus rupture, device embolization, cardiac rupture or perforation, acute myocardial infarction, arrhythmia, heart block or bradycardia requiring permanent pacemaker placement, congestive heart failure, respiratory failure, renal failure, pneumonia, infection, other late complications related to structural valve deterioration or migration, or other complications that might ultimately cause a temporary or permanent loss of functional independence or other long term morbidity.  The patient provides full informed consent for the procedure as described and all questions were answered preoperatively.    DETAILS OF THE OPERATIVE  PROCEDURE  PREPARATION:   The patient is brought to the operating room on the above mentioned date and central monitoring was established by the  anesthesia team. The patient is placed in the supine position on the operating table.  Intravenous antibiotics are administered. Conscious sedation is used.   Baseline transthoracic echocardiogram was performed. The patient's chest, abdomen, both groins, and both lower extremities are prepared and draped in a sterile manner. A time out procedure is performed.   PERIPHERAL ACCESS:   Using the modified Seldinger technique, femoral arterial and venous access were obtained with placement of a 6 Fr sheath in the left common femoral artery and a 6 Fr sheath in the left femoral vein using u/s guidance.  A pigtail diagnostic catheter was passed through the femoral arterial sheath under fluoroscopic guidance into the aortic root.  A temporary transvenous pacemaker catheter was passed through the femoral venous sheath under fluoroscopic guidance into the right ventricle.  The pacemaker was tested to ensure stable lead placement and pacemaker capture. Aortic root angiography was performed in order to determine the optimal angiographic angle for valve deployment.  TRANSFEMORAL ACCESS:  A micropuncture kit was used to gain access to the right common femoral artery using u/s guidance. Position confirmed with angiography. Pre-closure with double ProGlide closure devices. The patient was heparinized systemically and ACT verified > 250 seconds.    A 14 Fr transfemoral E-sheath was introduced into the right common femoral artery after progressively dilating over an Amplatz superstiff wire. An AL-1 catheter was used to direct a straight-tip exchange length wire across the native aortic valve into the left ventricle. This was exchanged out for a pigtail catheter and position was confirmed in the LV apex. Simultaneous left ventricular, aortic, and left ventricular end-diastolic  pressures were recorded.  The pigtail catheter was then exchanged for an Safari wire in the LV apex.    TRANSCATHETER HEART VALVE DEPLOYMENT:  An Edwards Sapien 3 THV (size 26 mm) was prepared and crimped per manufacturer's guidelines, and the proper orientation of the valve is confirmed on the [employer] delivery system. The valve was advanced through the introducer sheath using normal technique until in an appropriate position in the abdominal aorta beyond the sheath tip. The balloon was then retracted and using the fine-tuning wheel was centered on the valve. The valve was then advanced across the aortic arch using appropriate flexion of the catheter. The valve was carefully positioned across the aortic valve annulus. The Commander catheter was retracted using normal technique. Once final position of the valve has been confirmed by angiographic assessment, the valve is deployed while temporarily holding ventilation and during rapid ventricular pacing to maintain systolic blood pressure < 50 mmHg and pulse pressure < 10 mmHg. The balloon inflation is held for >3 seconds after reaching full deployment volume. Once the balloon has fully deflated the balloon is retracted into the ascending aorta and valve function is assessed using TTE. There is felt to be no paravalvular leak and no central aortic insufficiency.  The patient's hemodynamic recovery following valve deployment is good.  The deployment balloon and guidewire are both removed. Echo demostrated acceptable post-procedural gradients, stable mitral valve function, and no AI.   PROCEDURE COMPLETION:  The sheath was then removed and closure devices were completed. Protamine  was administered once femoral arterial repair was complete. The temporary pacemaker, pigtail catheters and femoral sheaths were removed with  manual pressure used for venous and arterial hemostasis.    The patient tolerated the procedure well and is transported to the surgical  intensive care in stable condition. There were no immediate intraoperative complications. All sponge instrument and needle counts  are verified correct at completion of the operation.   No blood products were administered during the operation.  The patient received a total of 70 mL of intravenous contrast during the procedure.  Jibri Schriefer K Tekoa Hamor MD 09/05/2024 11:22 AM

## 2024-09-05 NOTE — Progress Notes (Signed)
 Pt has order for in/out cath and stated he was hurting and needed to be emptied. Order for Q3 PRN. Pt did not want to wait until after shift change and felt it was urgent. In/out cath 325 ml. Pt initially tolerated well then became dizzy and felt he was going to pass out and leave this place. Per RN at bedside had a glassy look. Bp shows drop in blood pressure approximately 20 mm/hg SBP. MD called and made aware. Will continue to assess. Pt laid back in bed and began to feel better. Pt stated he felt it was because he sat up in bed.  Pt was on bed rest till 1900 and this was first time sitting up. Pt continues to be on frequent vitals per protocol.

## 2024-09-05 NOTE — Progress Notes (Signed)
 Asked to see patient for elevated blood pressure.  Home Entresto  just recently resumed this afternoon.  He has blood pressures this morning that were well controlled.  Would not acutely treat.  Do not start IV nitroglycerin  drip.  Avoid hypotension in perioperative setting.  Reevaluate tomorrow and add PTA meds as tolerated.

## 2024-09-05 NOTE — Op Note (Signed)
 [facility]   MULTIDISCIPLINARY HEART VALVE TEAM   TAVR OPERATIVE NOTE   Date of Procedure:  09/05/2024  Preoperative Diagnosis: Severe Aortic Stenosis   Postoperative Diagnosis: Same   Procedure:   Transcatheter Aortic Valve Replacement - Percutaneous right Transfemoral Approach  Edwards Sapien 3 Ultra Resilia (size 26 mm, model # 9755RSL, serial # [id number])   Co-Surgeons:  Con Clunes, MD, Dorise Fellers, MD and Lurena Red, MD   Anesthesiologist:  Keneth, MD  Echocardiographer:  Delford, MD  Pre-operative Echo Findings: Severe aortic stenosis Normal left ventricular systolic function  Post-operative Echo Findings: No paravalvular leak Normal left ventricular systolic function   BRIEF CLINICAL NOTE AND INDICATIONS FOR SURGERY  This 83 year old gentleman has stage D, severe, symptomatic, paradoxical low-flow/low gradient aortic stenosis with NYHA class II symptoms of exertional fatigue and shortness of breath consistent with chronic diastolic congestive heart failure.  He has  also been having episodes of dizziness, lower extremity edema, and reduced appetite with weight loss.  I have personally reviewed his 2D echocardiogram, cardiac catheterization, and CTA studies.  His echo shows a severely calcified aortic valve with a low mean gradient of 10 mmHg, aortic valve area of 0.75 cm, dimensionless index 0.24, and low stroke-volume index of 26.  Left ventricular ejection fraction is 50 to 55% with severe concentric LVH concerning for possible amyloid.  Cardiac MRI was not possible due to his implanted Barostim device.  Cardiac catheterization showed a patent LIMA to the LAD with a 50% ostial stenosis.  There is severe native three-vessel disease with 90% restenosis within the previously placed left circumflex stent.  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  I agree that aortic valve replacement is indicated in this patient for relief of his symptoms and to prevent  further left ventricular dysfunction.  Given his age, comorbidities and prior coronary bypass surgery I do not think he is a candidate for redo sternotomy and open surgical AVR.  I think transcatheter aortic valve replacement be a reasonable alternative for treating him.  His gated cardiac CTA shows anatomy suitable for TAVR using a 26 mm SAPIEN 3 valve.  He has an aortobifemoral bypass graft in place but appears to transfemoral access.   The patient and his family were counseled at length regarding treatment alternatives for management of severe symptomatic aortic stenosis. The risks and benefits of surgical intervention has been discussed in detail. Long-term prognosis with medical therapy was discussed. Alternative approaches such as conventional surgical aortic valve replacement, transcatheter aortic valve replacement, and palliative medical therapy were compared and contrasted at length. This discussion was placed in the context of the patient's own specific clinical presentation and past medical history. All of their questions have been addressed.    Following the decision to proceed with transcatheter aortic valve replacement, a discussion was held regarding what types of management strategies would be attempted intraoperatively in the event of life-threatening complications, including whether or not the patient would be considered a candidate for the use of cardiopulmonary bypass and/or conversion to open sternotomy for attempted surgical intervention.  Given his age, prior CABG surgery, and other comorbidities I do not think he is a candidate for emergent sternotomy to manage any intraoperative complications.  The patient has been advised of a variety of complications that might develop including but not limited to risks of death, stroke, paravalvular leak, aortic dissection or other major vascular complications, aortic annulus rupture, device embolization, cardiac rupture or perforation, mitral  regurgitation, acute  myocardial infarction, arrhythmia, heart block or bradycardia requiring permanent pacemaker placement, congestive heart failure, respiratory failure, renal failure, pneumonia, infection, other late complications related to structural valve deterioration or migration, or other complications that might ultimately cause a temporary or permanent loss of functional independence or other long term morbidity. The patient provides full informed consent for the procedure as described and all questions were answered.     DETAILS OF THE OPERATIVE PROCEDURE  PREPARATION:    The patient was brought to the operating room on the above mentioned date and appropriate monitoring was established by the anesthesia team. The patient was placed in the supine position on the operating table.  Intravenous antibiotics were administered. The patient was monitored closely throughout the procedure under conscious sedation.  Baseline transthoracic echocardiogram was performed. The patient's abdomen and both groins were prepped and draped in a sterile manner. A time out procedure was performed.   PERIPHERAL ACCESS:    Using the modified Seldinger technique, femoral arterial access was obtained with placement of 6 Fr sheaths on the left side through the femoral limb of the graft.  A pigtail diagnostic catheter was passed through the 6 Fr arterial sheath under fluoroscopic guidance into the aortic root.  Aortic root angiography was performed in order to determine the optimal angiographic angle for valve deployment.   TRANSFEMORAL ACCESS:   Percutaneous transfemoral access and sheath placement was performed using ultrasound guidance.  The right limb of the femoral graft was cannulated using a micropuncture needle and appropriate location was verified using hand injection angiogram.  A pair of Abbott Perclose percutaneous closure devices were placed and a 6 French sheath replaced into the femoral artery.  The  patient was heparinized systemically and ACT verified > 250 seconds.    An 8 Fr transfemoral E-sheath was introduced into the right femoral artery graft after progressively dilating over an Amplatz superstiff wire. An AL-1 catheter was used to direct a straight-tip exchange length wire across the native aortic valve into the left ventricle. This was exchanged out for a pigtail catheter and position was confirmed in the LV apex. Simultaneous LV and Ao pressures were recorded.  The LVEDP was 16 mmHg.  The pigtail catheter was exchanged for a Safari wire in the LV apex. There was no capture with direct LV pacing, so a 6Fr sheath was placed in the left femoral vein and a temporary transvenous pacemaker was passed through the sheath under fluoroscopic guidance into the right ventricle.  The pacemaker was tested to ensure stable lead placement and pacemaker capture.  TRANSCATHETER HEART VALVE DEPLOYMENT:   An Edwards Sapien 3 Ultra transcatheter heart valve (26 mm) was prepared and crimped per manufacturer's guidelines, and the proper orientation of the valve was confirmed on the [employer] delivery system. The valve was advanced through the introducer sheath using normal technique until in an appropriate position in the abdominal aorta beyond the sheath tip. The balloon was then retracted and using the fine-tuning wheel was centered on the valve. The valve was then advanced across the aortic arch using appropriate flexion of the catheter. The valve was carefully positioned across the aortic valve annulus. The Commander catheter was retracted using normal technique. Once final position of the valve was confirmed by angiographic assessment, the valve was deployed during rapid ventricular pacing to maintain systolic blood pressure < 50 mmHg and pulse pressure < 10 mmHg. The balloon inflation was held for >3 seconds after reaching full deployment volume. Once the balloon was fully  deflated the balloon was  retracted into the ascending aorta and valve function was assessed using echocardiography. There was felt to be no paravalvular leak and no central aortic insufficiency.  The patient's hemodynamic recovery following valve deployment was good.  The deployment balloon and guidewire were both removed.    PROCEDURE COMPLETION:   The sheath was removed and femoral artery closure performed.  Protamine  was administered once femoral arterial repair was complete. The temporary pacemaker and pigtail catheter were removed. The left femoral arterial and venous sheaths were to be removed in recovery.  The patient tolerated the procedure well and was transported to the cath lab recovery area in stable condition. There were no immediate intraoperative complications. All sponge instrument and needle counts were verified correct at completion of the operation.   No blood products were administered during the operation.  The patient received a total of 70 mL of intravenous contrast during the procedure.   Con GORMAN Clunes, MD 09/05/2024 11:20 AM

## 2024-09-05 NOTE — Transfer of Care (Signed)
 Immediate Anesthesia Transfer of Care Note  Patient: Ryan [name]  Procedure(s) Performed: Transcatheter Aortic Valve Replacement, Transfemoral (Right) ECHOCARDIOGRAM, TRANSTHORACIC  Patient Location: Cath Lab  Anesthesia Type:MAC  Level of Consciousness: drowsy  Airway & Oxygen Therapy: Patient Spontanous Breathing and Patient connected to nasal cannula oxygen  Post-op Assessment: Report given to RN and Post -op Vital signs reviewed and stable  Post vital signs: Reviewed and stable  Last Vitals:  Vitals Value Taken Time  BP 107/52 09/05/24 11:24  Temp    Pulse 46 09/05/24 11:27  Resp 12 09/05/24 11:27  SpO2 96 % 09/05/24 11:27  Vitals shown include unfiled device data.  Last Pain:  Vitals:   09/05/24 1113  TempSrc:   PainSc: Asleep      Patients Stated Pain Goal: 0 (09/05/24 0703)  Complications: There were no known notable events for this encounter.

## 2024-09-05 NOTE — Anesthesia Preprocedure Evaluation (Signed)
 Anesthesia Evaluation  Patient identified by MRN, date of birth, ID band Patient awake    Reviewed: Allergy & Precautions, NPO status , Patient's Chart, lab work & pertinent test results, reviewed documented beta blocker date and time   History of Anesthesia Complications Negative for: history of anesthetic complications  Airway Mallampati: II  TM Distance: >3 FB     Dental no notable dental hx.    Pulmonary shortness of breath, pneumonia, former smoker   breath sounds clear to auscultation       Cardiovascular hypertension, + angina  + CAD, + Past MI, + CABG, + Peripheral Vascular Disease and +CHF  + dysrhythmias + Cardiac Defibrillator + Valvular Problems/Murmurs AS  Rhythm:Regular Rate:Normal + Systolic murmurs IMPRESSIONS     1. Severe concentric hypertrophy with severely reduced mitral tissue  doppler velocities. Strain with apical sparing. Findings concerning for  cardiac amyloidosis, consider CMR. Left ventricular ejection fraction, by  estimation, is 50 to 55%. The left  ventricle has low normal function. The left ventricle demonstrates  regional wall motion abnormalities (see scoring diagram/findings for  description). There is severe concentric left ventricular hypertrophy.  Indeterminate diastolic filling due to E-A  fusion. The average left ventricular global longitudinal strain is -12.5  %. The global longitudinal strain is abnormal.   2. Right ventricular systolic function is mildly reduced. The right  ventricular size is mildly enlarged. There is normal pulmonary artery  systolic pressure. The estimated right ventricular systolic pressure is  35.5 mmHg.   3. Left atrial size was mildly dilated.   4. The mitral valve is grossly normal. Mild mitral valve regurgitation.  No evidence of mitral stenosis.   5. Paradoxical low flow low gradient severe aortic stenosis is present.  Vmax 2.1 m/s, MG 10 mmHG, AVA 0.75  cm2, DI 0.24. SVi 26 cc/m2. Concerns  for cardiac amyloidosis. The aortic valve is tricuspid. There is severe  calcifcation of the aortic valve.  There is severe thickening of the aortic valve. Aortic valve regurgitation  is not visualized. Severe aortic valve stenosis.   6. The inferior vena cava is normal in size with greater than 50%  respiratory variability, suggesting right atrial pressure of 3 mmHg.      Neuro/Psych neg Seizures    GI/Hepatic ,GERD  ,,(+) neg Cirrhosis        Endo/Other  Hypothyroidism    Renal/GU CRFRenal disease     Musculoskeletal  (+) Arthritis , Osteoarthritis,    Abdominal   Peds  Hematology   Anesthesia Other Findings   Reproductive/Obstetrics                              Anesthesia Physical Anesthesia Plan  ASA: 4  Anesthesia Plan: MAC   Post-op Pain Management:    Induction: Intravenous  PONV Risk Score and Plan: 2 and Ondansetron  and TIVA  Airway Management Planned: Natural Airway and Simple Face Mask  Additional Equipment:   Intra-op Plan:   Post-operative Plan:   Informed Consent: I have reviewed the patients History and Physical, chart, labs and discussed the procedure including the risks, benefits and alternatives for the proposed anesthesia with the patient or authorized representative who has indicated his/her understanding and acceptance.     Dental advisory given  Plan Discussed with: CRNA  Anesthesia Plan Comments:          Anesthesia Quick Evaluation

## 2024-09-05 NOTE — Progress Notes (Signed)
 Site area: 23F left femoral arterial sheath Site Prior to Removal:  Level 0 Pressure Applied For: 30 minutes Manual:   yes Patient Status During Pull:  stable Post Pull Site:  Level 0 Post Pull Instructions Given:  yes Post Pull Pulses Present: left dp 2+ palpable Dressing Applied:  gauze and tegaderm Bedrest begins @ 1320 Comments:

## 2024-09-06 ENCOUNTER — Other Ambulatory Visit: Payer: Self-pay

## 2024-09-06 ENCOUNTER — Inpatient Hospital Stay (HOSPITAL_COMMUNITY)

## 2024-09-06 ENCOUNTER — Ambulatory Visit: Admitting: Cardiology

## 2024-09-06 ENCOUNTER — Encounter (HOSPITAL_COMMUNITY): Payer: Self-pay | Admitting: Internal Medicine

## 2024-09-06 DIAGNOSIS — Z8616 Personal history of COVID-19: Secondary | ICD-10-CM | POA: Diagnosis not present

## 2024-09-06 DIAGNOSIS — Z952 Presence of prosthetic heart valve: Secondary | ICD-10-CM

## 2024-09-06 DIAGNOSIS — I5042 Chronic combined systolic (congestive) and diastolic (congestive) heart failure: Secondary | ICD-10-CM | POA: Diagnosis not present

## 2024-09-06 DIAGNOSIS — Z006 Encounter for examination for normal comparison and control in clinical research program: Secondary | ICD-10-CM | POA: Diagnosis not present

## 2024-09-06 DIAGNOSIS — I350 Nonrheumatic aortic (valve) stenosis: Secondary | ICD-10-CM | POA: Diagnosis not present

## 2024-09-06 DIAGNOSIS — I130 Hypertensive heart and chronic kidney disease with heart failure and stage 1 through stage 4 chronic kidney disease, or unspecified chronic kidney disease: Secondary | ICD-10-CM | POA: Diagnosis not present

## 2024-09-06 LAB — BASIC METABOLIC PANEL WITH GFR
Anion gap: 9 (ref 5–15)
BUN: 12 mg/dL (ref 8–23)
CO2: 21 mmol/L — ABNORMAL LOW (ref 22–32)
Calcium: 8.3 mg/dL — ABNORMAL LOW (ref 8.9–10.3)
Chloride: 108 mmol/L (ref 98–111)
Creatinine, Ser: 1.26 mg/dL — ABNORMAL HIGH (ref 0.61–1.24)
GFR, Estimated: 57 mL/min — ABNORMAL LOW (ref >60)
Glucose, Bld: 96 mg/dL (ref 70–99)
Potassium: 4 mmol/L (ref 3.5–5.1)
Sodium: 138 mmol/L (ref 135–145)

## 2024-09-06 LAB — CBC
HCT: 37.2 % — ABNORMAL LOW (ref 39.0–52.0)
Hemoglobin: 12.2 g/dL — ABNORMAL LOW (ref 13.0–17.0)
MCH: 31.4 pg (ref 26.0–34.0)
MCHC: 32.8 g/dL (ref 30.0–36.0)
MCV: 95.9 fL (ref 80.0–100.0)
Platelets: 243 K/uL (ref 150–400)
RBC: 3.88 MIL/uL — ABNORMAL LOW (ref 4.22–5.81)
RDW: 13.8 % (ref 11.5–15.5)
WBC: 9.4 K/uL (ref 4.0–10.5)
nRBC: 0 % (ref 0.0–0.2)

## 2024-09-06 LAB — ECHOCARDIOGRAM COMPLETE
AR max vel: 1.94 cm2
AV Area VTI: 1.85 cm2
AV Area mean vel: 1.88 cm2
AV Mean grad: 4 mmHg
AV Peak grad: 7.1 mmHg
Ao pk vel: 1.33 m/s
Area-P 1/2: 4.06 cm2
Height: 65.5 in
S' Lateral: 3.2 cm
Weight: 2176.38 [oz_av]

## 2024-09-06 LAB — MAGNESIUM: Magnesium: 1.9 mg/dL (ref 1.7–2.4)

## 2024-09-06 MED ORDER — AMOXICILLIN 500 MG PO TABS: 2000.0000 mg | ORAL_TABLET | ORAL | 12 refills | Status: AC

## 2024-09-06 MED ORDER — TORSEMIDE 20 MG PO TABS: 20.0000 mg | ORAL_TABLET | Freq: Every day | ORAL | Status: DC

## 2024-09-06 MED ORDER — ENSURE PLUS HIGH PROTEIN PO LIQD: 237.0000 mL | Freq: Two times a day (BID) | ORAL | Status: DC

## 2024-09-06 MED FILL — Lidocaine HCl Local Preservative Free (PF) Inj 1%: INTRAMUSCULAR | Qty: 30 | Status: AC

## 2024-09-06 NOTE — Progress Notes (Signed)
 Discharge   Pt and wife verbalized understanding of discharge plan of care. NO PIV NO FOLEY NO TELE present at discharge.  OK to discharge per Dr. Lucas (via secure chat). Per MD, his PA will clear Discharge Med Rec.  Transferred to Main A.   Volunteer called.

## 2024-09-06 NOTE — Progress Notes (Signed)
 Educated pt about post TAVR restrictions, exercise guidelines, heart-healthy diet education, and the CRPII program.  Will refer to [facility].    Cardiac Rehab Phase I [id number] RODGER Music, RN BSN 09/06/2024 10:59 AM

## 2024-09-06 NOTE — Progress Notes (Signed)
 Mobility Specialist Progress Note:    09/06/24 0929  Mobility  Activity Ambulated with assistance  Level of Assistance Contact guard assist, steadying assist  Assistive Device Front wheel walker  Distance Ambulated (ft) 200 ft  Activity Response Tolerated well  Mobility Referral Yes  Mobility visit 1 Mobility  Mobility Specialist Start Time (ACUTE ONLY) [id number]  Mobility Specialist Stop Time (ACUTE ONLY) 0929  Mobility Specialist Time Calculation (min) (ACUTE ONLY) 6 min   Pt received in bed, agreeable to mobility session. Ambulated in hallway with RW and CGA. Tolerated well, asx throughout. Returned to room, left with all needs met, eager for d/c.   Elison Worrel Mobility Specialist Please contact via SecureChat or  Rehab office at [phone]

## 2024-09-07 ENCOUNTER — Telehealth: Payer: Self-pay

## 2024-09-07 ENCOUNTER — Telehealth (HOSPITAL_COMMUNITY): Payer: Self-pay

## 2024-09-07 NOTE — Telephone Encounter (Signed)
 Attempted to call patient in regards to Cardiac Rehab - Left message with pt's wife for pt to call back.

## 2024-09-07 NOTE — Telephone Encounter (Signed)
 Patient contacted regarding discharge from [facility] on 09/06/24  Patient understands to follow up with [facility] on 09/12/24 at 3:00PM at [address]. Patient understands discharge instructions? Yes Patient understands medications and regiment? Yes Patient understands to bring all medications to this visit? Yes

## 2024-09-11 ENCOUNTER — Telehealth (HOSPITAL_COMMUNITY): Payer: Self-pay

## 2024-09-11 NOTE — Telephone Encounter (Signed)
 Called to confirm/remind patient of their appointment at the [facility] on 09/12/24.   Appointment:   [x] Confirmed  [] Left mess   [] No answer/No voice mail  [] VM Full/unable to leave message  [] Phone not in service  Patient reminded to bring all medications and/or complete list.  Confirmed patient has transportation. Gave directions, instructed to utilize valet parking.

## 2024-09-12 ENCOUNTER — Encounter (HOSPITAL_COMMUNITY): Payer: Self-pay

## 2024-09-12 ENCOUNTER — Ambulatory Visit (HOSPITAL_COMMUNITY)
Admission: RE | Admit: 2024-09-12 | Discharge: 2024-09-12 | Disposition: A | Discharge status: Home / Self Care | Source: Ambulatory Visit | Attending: Family Medicine | Admitting: Family Medicine

## 2024-09-12 VITALS — BP 108/50 | HR 55 | Wt 127.8 lb

## 2024-09-12 DIAGNOSIS — I4819 Other persistent atrial fibrillation: Secondary | ICD-10-CM

## 2024-09-12 DIAGNOSIS — Z951 Presence of aortocoronary bypass graft: Secondary | ICD-10-CM | POA: Insufficient documentation

## 2024-09-12 DIAGNOSIS — Z95810 Presence of automatic (implantable) cardiac defibrillator: Secondary | ICD-10-CM | POA: Insufficient documentation

## 2024-09-12 DIAGNOSIS — I4821 Permanent atrial fibrillation: Secondary | ICD-10-CM | POA: Diagnosis not present

## 2024-09-12 DIAGNOSIS — I5022 Chronic systolic (congestive) heart failure: Secondary | ICD-10-CM | POA: Diagnosis not present

## 2024-09-12 DIAGNOSIS — Z79899 Other long term (current) drug therapy: Secondary | ICD-10-CM | POA: Diagnosis not present

## 2024-09-12 DIAGNOSIS — I25810 Atherosclerosis of coronary artery bypass graft(s) without angina pectoris: Secondary | ICD-10-CM | POA: Diagnosis not present

## 2024-09-12 DIAGNOSIS — Z4502 Encounter for adjustment and management of automatic implantable cardiac defibrillator: Secondary | ICD-10-CM | POA: Diagnosis not present

## 2024-09-12 DIAGNOSIS — Z8249 Family history of ischemic heart disease and other diseases of the circulatory system: Secondary | ICD-10-CM | POA: Diagnosis not present

## 2024-09-12 DIAGNOSIS — Z952 Presence of prosthetic heart valve: Secondary | ICD-10-CM | POA: Insufficient documentation

## 2024-09-12 DIAGNOSIS — Z7901 Long term (current) use of anticoagulants: Secondary | ICD-10-CM | POA: Insufficient documentation

## 2024-09-12 DIAGNOSIS — I350 Nonrheumatic aortic (valve) stenosis: Secondary | ICD-10-CM | POA: Diagnosis not present

## 2024-09-12 DIAGNOSIS — I472 Ventricular tachycardia, unspecified: Secondary | ICD-10-CM | POA: Diagnosis not present

## 2024-09-12 DIAGNOSIS — I255 Ischemic cardiomyopathy: Secondary | ICD-10-CM | POA: Diagnosis not present

## 2024-09-12 DIAGNOSIS — I2510 Atherosclerotic heart disease of native coronary artery without angina pectoris: Secondary | ICD-10-CM | POA: Insufficient documentation

## 2024-09-12 DIAGNOSIS — N183 Chronic kidney disease, stage 3 unspecified: Secondary | ICD-10-CM | POA: Diagnosis not present

## 2024-09-12 DIAGNOSIS — N1831 Chronic kidney disease, stage 3a: Secondary | ICD-10-CM | POA: Diagnosis not present

## 2024-09-12 DIAGNOSIS — I739 Peripheral vascular disease, unspecified: Secondary | ICD-10-CM | POA: Insufficient documentation

## 2024-09-12 LAB — CBC
HCT: 36.9 % — ABNORMAL LOW (ref 39.0–52.0)
Hemoglobin: 12.1 g/dL — ABNORMAL LOW (ref 13.0–17.0)
MCH: 31.8 pg (ref 26.0–34.0)
MCHC: 32.8 g/dL (ref 30.0–36.0)
MCV: 96.9 fL (ref 80.0–100.0)
Platelets: 195 K/uL (ref 150–400)
RBC: 3.81 MIL/uL — ABNORMAL LOW (ref 4.22–5.81)
RDW: 14.3 % (ref 11.5–15.5)
WBC: 7.9 K/uL (ref 4.0–10.5)
nRBC: 0 % (ref 0.0–0.2)

## 2024-09-12 LAB — COMPREHENSIVE METABOLIC PANEL WITH GFR
ALT: 8 U/L (ref 0–44)
AST: 19 U/L (ref 15–41)
Albumin: 3.3 g/dL — ABNORMAL LOW (ref 3.5–5.0)
Alkaline Phosphatase: 54 U/L (ref 38–126)
Anion gap: 13 (ref 5–15)
BUN: 29 mg/dL — ABNORMAL HIGH (ref 8–23)
CO2: 25 mmol/L (ref 22–32)
Calcium: 8.8 mg/dL — ABNORMAL LOW (ref 8.9–10.3)
Chloride: 105 mmol/L (ref 98–111)
Creatinine, Ser: 1.55 mg/dL — ABNORMAL HIGH (ref 0.61–1.24)
GFR, Estimated: 44 mL/min — ABNORMAL LOW (ref >60)
Glucose, Bld: 104 mg/dL — ABNORMAL HIGH (ref 70–99)
Potassium: 3.5 mmol/L (ref 3.5–5.1)
Sodium: 143 mmol/L (ref 135–145)
Total Bilirubin: 0.8 mg/dL (ref 0.0–1.2)
Total Protein: 6.5 g/dL (ref 6.5–8.1)

## 2024-09-12 NOTE — Progress Notes (Signed)
 ADVANCED HF CLINIC NOTE  PCP: [name], MD Cardiology: Dr. Wendel HF Cardiology: Dr. Rolan  HPI: 83 y.o. with history of CAD s/p CABG, ischemic cardiomyopathy, permanent atrial fibrillation, PAD, and CKD stage 3 was referred by Dr. Elmira for evaluation of CHF.  Patient is s/p CABG, last cath in 5/23 showed occluded native vessels with patent LIMA-LAD (occluded SVG-ramus and SVG-PDA), no interventional target.  Last echo in 8/24 showed EF 25-30%, moderate LVH, severe LV dilation, mild aortic stenosis, mild MR.  Patient has a Environmental manager ICD.  He has had atrial fibrillation that appears to be permanent; all ECGs since 2021 show atrial fibrillation.  He has PAD and is s/p aortofemoral bypass.  He has had VT episodes, the last was on 11/08/23.    RHC in 12/24 showed normal filling pressures, CI 2.55 by Fick.    S/p Barostim placement 02/23/24.  Seen by Dr. Wendel 07/18/24, has been feeling poorly with worsening dyspnea and pre-syncope. Echo showed LF/LG AS. Work up started for [employer]. Unable to have cMRI due to barostim device.  [facility] 8/25 showed a patent LIMA to the LAD with a 50% ostial stenosis.  There is severe native three-vessel disease with 90% restenosis within the previously placed left circumflex stent, he has known occlusion of his vein grafts; RA 6, PA 38/10 (20), PCWP 14 (with v waves up to 23 mmhg), CO/CI (Fick) 3.25/2.01, PVR 1.8 WU  and PAPi 4.7  S/p TAVR 09/05/24. Post op echo showed EF 45-50%, G2DD, normal RV, stable TAVR valve.  Today he returns for post hospital HF follow up with his wife. Overall feeling weak, has no appetite and legs are weak. He is able to walk with a cane around his house. No SOB. Denies palpitations, abnormal bleeding, CP,edema, or PND/Orthopnea. Weight at home 124 pounds. Taking all medications.   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  PMH: 1. PAD: H/o aortobifemoral bypass 2. CVA 3. CKD stage 3 4. Gout 5. Hypothyroidism 6. Hyperlipidemia 7. VT: Amiodarone  use. 8. Atrial fibrillation: Permanent, in atrial fibrillation since 2021.  9. CAD: S/p CABG.  - LHC (5/23): 99% prox/mid LCx (subtotal occlusion), occluded mid LAD, 95% D1, occluded RCA, occluded SVG-PDA, occluded SVG-ramus, patent LIMA-LAD with collaterals from LAD to RCA and LCx territories.  10. Chronic systolic CHF: Ischemic cardiomyopathy: [employer] ICD.  - Echo (8/24) with EF 25-30%, moderate LVH, severe LV dilation, mild aortic stenosis, mild MR.  - R/LHC (8/25): patent LIMA to the LAD with a 50% ostial stenosis.  There is severe native three-vessel disease with 90% restenosis within the previously placed left circumflex stent, he has known occlusion of his vein grafts; RA 6, PA 38/10 (20), PCWP 14 (with v waves up to 23 mmhg), CO/CI (Fick) 3.25/2.01, PVR 1.8 WU  and PAPi 4.7 11. Aortic stenosis: Mild on 8/24 echo.  - s/p TAVR 9/25  SH: Married, no smoking/ETOH.  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   Family History  Problem Relation Age of Onset   Heart disease Father    Heart attack Father    Heart disease Sister    Hypertension Sister  Heart attack Sister    Hypertension Mother    Diabetes Son    ROS: All systems reviewed and negative except as per HPI.   Current Outpatient Medications  Medication Sig Dispense Refill   ALPRAZolam  (XANAX ) 0.5 MG tablet Take 0.5 mg by mouth 3 (three) times daily as needed for anxiety or sleep.     amiodarone  (PACERONE ) 200 MG tablet Take 200 mg by mouth daily.     amoxicillin  (AMOXIL ) 500 MG tablet Take 4 tablets (2,000 mg total) by mouth  as directed. Please take 4 tablets 1 hour prior to dental work, including cleanings 12 tablet 12   apixaban  (ELIQUIS ) 5 MG TABS tablet Take 5 mg by mouth 2 (two) times daily.     bisacodyl  (DULCOLAX) 5 MG EC tablet Take 10 mg by mouth at bedtime.     colchicine  0.6 MG tablet Take 1 tablet (0.6 mg total) by mouth 2 (two) times daily. 60 tablet 3   digoxin  (LANOXIN ) 0.125 MG tablet Take 0.5 tablets (0.0625 mg total) by mouth every other day.     Evolocumab  (REPATHA  SURECLICK) 140 MG/ML SOAJ Inject 140 mg into the skin every 14 (fourteen) days. 2 mL 0   isosorbide  mononitrate (IMDUR ) 30 MG 24 hr tablet Take 1 tablet (30 mg total) by mouth daily. 90 tablet 3   levothyroxine  (SYNTHROID ) 50 MCG tablet Take 1 tablet (50 mcg total) by mouth daily before breakfast. PLEASE SCHEDULE APPOINTMENT FOR MORE REFILLS 90 tablet 0   meclizine  (ANTIVERT ) 12.5 MG tablet TAKE 2 TABLETS BY MOUTH 3 TIMES A DAY AS NEEDED **FURTHER REFILLS NEED TO BE PRESCRIBED BY PRIMARY CARE PHYSICIAN** 180 tablet 0   methocarbamol  (ROBAXIN ) 500 MG tablet Take 500 mg by mouth daily as needed for muscle spasms (back pain.).     mirtazapine  (REMERON ) 15 MG tablet Take 15 mg by mouth at bedtime.     nitroGLYCERIN  (NITROSTAT ) 0.4 MG SL tablet Place 1 tablet (0.4 mg total) under the tongue every 5 (five) minutes as needed for chest pain. 25 tablet 3   ondansetron  (ZOFRAN ) 4 MG tablet Take 4 mg by mouth every 8 (eight) hours as needed for nausea or vomiting.     oxyCODONE  (ROXICODONE ) 5 MG immediate release tablet Take 1 tablet (5 mg total) by mouth every 6 (six) hours as needed. 8 tablet 0   pantoprazole  (PROTONIX ) 40 MG tablet Take 1 tablet (40 mg total) by mouth daily. 30 tablet 11   polyethylene glycol powder (GLYCOLAX /MIRALAX ) 17 GM/SCOOP powder Take 17 g by mouth at bedtime.     sacubitril -valsartan  (ENTRESTO ) 24-26 MG Take 1 tablet by mouth 2 (two) times daily. 60 tablet 11   tobramycin  (TOBREX ) 0.3 % ophthalmic solution Place 1 drop into  the left eye daily as needed (Dry eyes).     torsemide  (DEMADEX ) 20 MG tablet Take 1 tablet (20 mg total) by mouth daily.     No current facility-administered medications for this encounter.   Wt Readings from Last 3 Encounters:  09/12/24 58 kg (127 lb 12.8 oz)  09/06/24 61.7 kg (136 lb 0.4 oz)  08/16/24 54.9 kg (121 lb)   BP (!) 108/50   Pulse (!) 55   Wt 58 kg (127 lb 12.8 oz)   SpO2 99%   BMI 20.94 kg/m   Physical Exam: General:  NAD. No resp difficulty, [facility], elderly HEENT: Normal Neck: Supple. No JVD. Cor: Irregular rate & rhythm. No rubs, gallops or murmurs. Lungs: Clear Abdomen: Soft,  nontender, nondistended.  Extremities: No cyanosis, clubbing, rash, edema; groinssites stable without hematoma or ecchymosis  Neuro: Alert & oriented x 3, moves all 4 extremities w/o difficulty. Affect pleasant.  Assessment/Plan: 1. Chronic systolic CHF: Ischemic cardiomyopathy.  [employer] ICD.  Echo in 8/24 showed EF 25-30%, moderate LVH, severe LV dilation, mild aortic stenosis, mild MR.  RHC in 12/24 showed normal filling pressures, CI 2.55. Echo 4/25 with hypertrophy and reduced mitral tissue, concerning for amyloidosis, EF 50-55%, mildly reduced RV. Unable to have cMRI due to barostim. S/P BaroStim placement with Dr. Serene. NYHA III, recent surgery and deconditioning likely contributing to functional status. He is not volume overloaded by exam or device interrogation, I suspect he may be hypovolemic based on symptoms with poor PO intake. - Stop Entresto  with low BP and orthostasis - Hold torsemide  tomorrow, may resume following day at 20 mg daily. - Continue digoxin  0.0625 every other day. Check dig level next visit - He failed both Jardiance  and Farxiga  due to nausea. - Off bisoprolol  with bradycardia 2. LFLG Aortic Stenosis: Echo 4/25 with LFLG AS with AVA 0.75cm2, DI 0.24, Vmax 2.1 m/s - now s/p TAVR 9/25. Groins sites stable without hematoma or ecchymosis  3. CAD:  s/p CABG.  Cath in 5/23 without interventional option.  Severe disease of native vessels, occluded SVG-ramus and SVG-PDA, patent LIMA-LAD. No chest pain.  - No ASA given Eliquis  use.  - Continue Repatha .  4. Atrial fibrillation: Present since 2021. Prev thought to be permanent, however ECGs from 2/25 and 7/25 showed SB with 1AVB.  - Continue apixaban , dosed at 2.5 bid for age > 64, creatinine usually > 1.5. No bleeding issues. CBC today. 5. CKD stage 3: baseline SCr 1.2. - BMET today. 6. VT: Last episode in 11/24 terminated by ATP.  No further VT on device interrogation. - Continue amio 100 mg daily. Follow LFTs and TSH. He will need a regular eye exam while on amiodarone . - ? If cause of nausea. 7. PAD: H/o aortobifemoral bypass.  He denies claudication.  - Continue statin.   Follow up in 2 weeks with APP  Harlene CHRISTELLA Gainer, FNP 09/12/2024

## 2024-09-12 NOTE — Patient Instructions (Addendum)
 Good to see you today!   STOP Entresto   Hold torsemide  tomorrow then resume on Thursday 9/25 at some dose  Labs done today, your results will be available in MyChart, we will contact you for abnormal readings.  Your physician recommends that you schedule a follow-up appointment   If you have any questions or concerns before your next appointment please send us  a message through [place] or call our office at [phone].    TO LEAVE A MESSAGE FOR THE NURSE SELECT OPTION 2, PLEASE LEAVE A MESSAGE INCLUDING: YOUR NAME DATE OF BIRTH CALL BACK NUMBER REASON FOR CALL**this is important as we prioritize the call backs  YOU WILL RECEIVE A CALL BACK THE SAME DAY AS LONG AS YOU CALL BEFORE 4:00 PM At the [facility], you and your health needs are our priority. As part of our continuing mission to provide you with exceptional heart care, we have created designated Provider Care Teams. These Care Teams include your primary Cardiologist (physician) and Advanced Practice Providers (APPs- Physician Assistants and Nurse Practitioners) who all work together to provide you with the care you need, when you need it.   You may see any of the following providers on your designated Care Team at your next follow up: Dr Toribio Fuel Dr Ezra Shuck Dr. Ria Commander Dr. Morene Brownie Amy Lenetta, NP Caffie Shed, [place] [facility] [place], [place] Beckey Coe, NP [place] Lee, NP Ellouise Class, NP Tinnie Redman, PharmD Jaun Bash, PharmD   Please be sure to bring in all your medications bottles to every appointment.    Thank you for choosing [facility]

## 2024-09-13 ENCOUNTER — Telehealth (HOSPITAL_COMMUNITY): Payer: Self-pay | Admitting: *Deleted

## 2024-09-13 ENCOUNTER — Other Ambulatory Visit: Payer: Self-pay | Admitting: Physician Assistant

## 2024-09-13 ENCOUNTER — Ambulatory Visit (HOSPITAL_COMMUNITY): Payer: Self-pay | Admitting: Family Medicine

## 2024-09-13 DIAGNOSIS — I5022 Chronic systolic (congestive) heart failure: Secondary | ICD-10-CM

## 2024-09-13 DIAGNOSIS — Z952 Presence of prosthetic heart valve: Secondary | ICD-10-CM

## 2024-09-13 MED ORDER — TORSEMIDE 20 MG PO TABS: 20.0000 mg | ORAL_TABLET | Freq: Every day | ORAL | Status: DC | PRN

## 2024-09-13 NOTE — Telephone Encounter (Signed)
 Left detailed instructions for Amyloid study on vm per dpr.

## 2024-09-14 ENCOUNTER — Telehealth (HOSPITAL_COMMUNITY): Payer: Self-pay | Admitting: *Deleted

## 2024-09-15 ENCOUNTER — Other Ambulatory Visit (HOSPITAL_COMMUNITY): Payer: Self-pay | Admitting: Family Medicine

## 2024-09-15 DIAGNOSIS — I5022 Chronic systolic (congestive) heart failure: Secondary | ICD-10-CM

## 2024-09-19 ENCOUNTER — Ambulatory Visit (HOSPITAL_COMMUNITY)
Admission: RE | Admit: 2024-09-19 | Discharge: 2024-09-19 | Disposition: A | Discharge status: Home / Self Care | Source: Ambulatory Visit | Attending: Cardiovascular Disease | Admitting: Cardiovascular Disease

## 2024-09-19 DIAGNOSIS — I5022 Chronic systolic (congestive) heart failure: Secondary | ICD-10-CM | POA: Insufficient documentation

## 2024-09-19 DIAGNOSIS — J90 Pleural effusion, not elsewhere classified: Secondary | ICD-10-CM | POA: Diagnosis not present

## 2024-09-19 DIAGNOSIS — J9811 Atelectasis: Secondary | ICD-10-CM | POA: Diagnosis not present

## 2024-09-19 LAB — MYOCARDIAL AMYLOID PLANAR & SPECT: H/CL Ratio: 1.7

## 2024-09-19 MED ORDER — TECHNETIUM TC 99M PYROPHOSPHATE
21.5000 | Freq: Once | INTRAVENOUS | Status: AC
  Administered 2024-09-19: 21.5 via INTRAVENOUS

## 2024-09-20 ENCOUNTER — Ambulatory Visit (HOSPITAL_COMMUNITY): Payer: Self-pay | Admitting: Family Medicine

## 2024-09-25 ENCOUNTER — Emergency Department (HOSPITAL_COMMUNITY)

## 2024-09-25 ENCOUNTER — Other Ambulatory Visit: Payer: Self-pay

## 2024-09-25 ENCOUNTER — Encounter: Payer: Self-pay | Admitting: Family Medicine

## 2024-09-25 ENCOUNTER — Emergency Department (HOSPITAL_COMMUNITY)
Admission: EM | Admit: 2024-09-25 | Discharge: 2024-09-25 | Disposition: A | Discharge status: Home / Self Care | Attending: Emergency Medicine | Admitting: Emergency Medicine

## 2024-09-25 ENCOUNTER — Telehealth (HOSPITAL_COMMUNITY): Payer: Self-pay

## 2024-09-25 DIAGNOSIS — I509 Heart failure, unspecified: Secondary | ICD-10-CM | POA: Diagnosis not present

## 2024-09-25 DIAGNOSIS — S61511A Laceration without foreign body of right wrist, initial encounter: Secondary | ICD-10-CM | POA: Diagnosis not present

## 2024-09-25 DIAGNOSIS — R079 Chest pain, unspecified: Secondary | ICD-10-CM | POA: Diagnosis not present

## 2024-09-25 DIAGNOSIS — T07XXXA Unspecified multiple injuries, initial encounter: Secondary | ICD-10-CM | POA: Diagnosis not present

## 2024-09-25 DIAGNOSIS — Z7901 Long term (current) use of anticoagulants: Secondary | ICD-10-CM | POA: Insufficient documentation

## 2024-09-25 DIAGNOSIS — Z951 Presence of aortocoronary bypass graft: Secondary | ICD-10-CM | POA: Insufficient documentation

## 2024-09-25 DIAGNOSIS — S6991XA Unspecified injury of right wrist, hand and finger(s), initial encounter: Secondary | ICD-10-CM | POA: Diagnosis not present

## 2024-09-25 DIAGNOSIS — S12690A Other displaced fracture of seventh cervical vertebra, initial encounter for closed fracture: Secondary | ICD-10-CM | POA: Diagnosis not present

## 2024-09-25 DIAGNOSIS — R0789 Other chest pain: Secondary | ICD-10-CM | POA: Diagnosis not present

## 2024-09-25 DIAGNOSIS — Z79899 Other long term (current) drug therapy: Secondary | ICD-10-CM | POA: Diagnosis not present

## 2024-09-25 DIAGNOSIS — I2510 Atherosclerotic heart disease of native coronary artery without angina pectoris: Secondary | ICD-10-CM | POA: Insufficient documentation

## 2024-09-25 DIAGNOSIS — Z79890 Hormone replacement therapy (postmenopausal): Secondary | ICD-10-CM | POA: Insufficient documentation

## 2024-09-25 DIAGNOSIS — Y92410 Unspecified street and highway as the place of occurrence of the external cause: Secondary | ICD-10-CM | POA: Diagnosis not present

## 2024-09-25 DIAGNOSIS — S61512A Laceration without foreign body of left wrist, initial encounter: Secondary | ICD-10-CM | POA: Diagnosis not present

## 2024-09-25 DIAGNOSIS — R918 Other nonspecific abnormal finding of lung field: Secondary | ICD-10-CM | POA: Diagnosis not present

## 2024-09-25 DIAGNOSIS — E875 Hyperkalemia: Secondary | ICD-10-CM | POA: Diagnosis not present

## 2024-09-25 DIAGNOSIS — D320 Benign neoplasm of cerebral meninges: Secondary | ICD-10-CM | POA: Diagnosis not present

## 2024-09-25 DIAGNOSIS — Z87891 Personal history of nicotine dependence: Secondary | ICD-10-CM | POA: Insufficient documentation

## 2024-09-25 DIAGNOSIS — J90 Pleural effusion, not elsewhere classified: Secondary | ICD-10-CM | POA: Insufficient documentation

## 2024-09-25 DIAGNOSIS — E039 Hypothyroidism, unspecified: Secondary | ICD-10-CM | POA: Diagnosis not present

## 2024-09-25 DIAGNOSIS — I110 Hypertensive heart disease with heart failure: Secondary | ICD-10-CM | POA: Diagnosis not present

## 2024-09-25 DIAGNOSIS — S3991XA Unspecified injury of abdomen, initial encounter: Secondary | ICD-10-CM | POA: Diagnosis not present

## 2024-09-25 DIAGNOSIS — I700 Atherosclerosis of aorta: Secondary | ICD-10-CM | POA: Diagnosis not present

## 2024-09-25 DIAGNOSIS — S40012A Contusion of left shoulder, initial encounter: Secondary | ICD-10-CM | POA: Insufficient documentation

## 2024-09-25 DIAGNOSIS — S6992XA Unspecified injury of left wrist, hand and finger(s), initial encounter: Secondary | ICD-10-CM | POA: Diagnosis not present

## 2024-09-25 DIAGNOSIS — M79642 Pain in left hand: Secondary | ICD-10-CM | POA: Diagnosis not present

## 2024-09-25 DIAGNOSIS — M79644 Pain in right finger(s): Secondary | ICD-10-CM | POA: Insufficient documentation

## 2024-09-25 DIAGNOSIS — S0990XA Unspecified injury of head, initial encounter: Secondary | ICD-10-CM | POA: Diagnosis not present

## 2024-09-25 DIAGNOSIS — S3993XA Unspecified injury of pelvis, initial encounter: Secondary | ICD-10-CM | POA: Diagnosis not present

## 2024-09-25 DIAGNOSIS — J9811 Atelectasis: Secondary | ICD-10-CM | POA: Insufficient documentation

## 2024-09-25 DIAGNOSIS — Z96643 Presence of artificial hip joint, bilateral: Secondary | ICD-10-CM | POA: Diagnosis not present

## 2024-09-25 DIAGNOSIS — S12600A Unspecified displaced fracture of seventh cervical vertebra, initial encounter for closed fracture: Secondary | ICD-10-CM | POA: Diagnosis not present

## 2024-09-25 DIAGNOSIS — S299XXA Unspecified injury of thorax, initial encounter: Secondary | ICD-10-CM | POA: Diagnosis not present

## 2024-09-25 LAB — CBC
HCT: 33.8 % — ABNORMAL LOW (ref 39.0–52.0)
Hemoglobin: 11.2 g/dL — ABNORMAL LOW (ref 13.0–17.0)
MCH: 32 pg (ref 26.0–34.0)
MCHC: 33.1 g/dL (ref 30.0–36.0)
MCV: 96.6 fL (ref 80.0–100.0)
Platelets: 161 K/uL (ref 150–400)
RBC: 3.5 MIL/uL — ABNORMAL LOW (ref 4.22–5.81)
RDW: 14.6 % (ref 11.5–15.5)
WBC: 8.1 K/uL (ref 4.0–10.5)
nRBC: 0 % (ref 0.0–0.2)

## 2024-09-25 LAB — COMPREHENSIVE METABOLIC PANEL WITH GFR
ALT: 14 U/L (ref 0–44)
AST: 32 U/L (ref 15–41)
Albumin: 3 g/dL — ABNORMAL LOW (ref 3.5–5.0)
Alkaline Phosphatase: 57 U/L (ref 38–126)
Anion gap: 10 (ref 5–15)
BUN: 27 mg/dL — ABNORMAL HIGH (ref 8–23)
CO2: 25 mmol/L (ref 22–32)
Calcium: 8 mg/dL — ABNORMAL LOW (ref 8.9–10.3)
Chloride: 106 mmol/L (ref 98–111)
Creatinine, Ser: 1.5 mg/dL — ABNORMAL HIGH (ref 0.61–1.24)
GFR, Estimated: 46 mL/min — ABNORMAL LOW (ref >60)
Glucose, Bld: 111 mg/dL — ABNORMAL HIGH (ref 70–99)
Potassium: 3.2 mmol/L — ABNORMAL LOW (ref 3.5–5.1)
Sodium: 141 mmol/L (ref 135–145)
Total Bilirubin: 0.8 mg/dL (ref 0.0–1.2)
Total Protein: 6 g/dL — ABNORMAL LOW (ref 6.5–8.1)

## 2024-09-25 LAB — I-STAT CHEM 8, ED
BUN: 41 mg/dL — ABNORMAL HIGH (ref 8–23)
Calcium, Ion: 1.01 mmol/L — ABNORMAL LOW (ref 1.15–1.40)
Chloride: 103 mmol/L (ref 98–111)
Creatinine, Ser: 1.5 mg/dL — ABNORMAL HIGH (ref 0.61–1.24)
Glucose, Bld: 111 mg/dL — ABNORMAL HIGH (ref 70–99)
HCT: 34 % — ABNORMAL LOW (ref 39.0–52.0)
Hemoglobin: 11.6 g/dL — ABNORMAL LOW (ref 13.0–17.0)
Potassium: 5.6 mmol/L — ABNORMAL HIGH (ref 3.5–5.1)
Sodium: 141 mmol/L (ref 135–145)
TCO2: 29 mmol/L (ref 22–32)

## 2024-09-25 LAB — SAMPLE TO BLOOD BANK

## 2024-09-25 LAB — PROTIME-INR
INR: 1.4 — ABNORMAL HIGH (ref 0.8–1.2)
Prothrombin Time: 17.6 s — ABNORMAL HIGH (ref 11.4–15.2)

## 2024-09-25 LAB — I-STAT CG4 LACTIC ACID, ED: Lactic Acid, Venous: 2.1 mmol/L (ref 0.5–1.9)

## 2024-09-25 LAB — ETHANOL: Alcohol, Ethyl (B): <15 mg/dL (ref <15)

## 2024-09-25 MED ORDER — IOHEXOL 350 MG/ML SOLN
75.0000 mL | Freq: Once | INTRAVENOUS | Status: AC | PRN
  Administered 2024-09-25: 75 mL via INTRAVENOUS
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
## 2024-09-25 NOTE — ED Notes (Signed)
 CT called to bring patient over. Per CT if patient is a level 2 then labs need to be complete before CT scan.
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
## 2024-09-25 NOTE — Discharge Instructions (Addendum)
 While you are in the emergency room, you had scans done of your head, neck, chest abdomen pelvis.  Your x-rays done of your chest and your pelvis.  Overall, these test were good.  You do have some fluid on your lungs.  Like we discussed, it is important that you continue to take your diuretics over the next couple of days and follow-up with your primary care doctor.  Your potassium was also a little bit low, sometimes this can happen when you are taking those diuretic medications.  Make sure that you are taking your potassium pills as well.  Please follow-up with your primary care doctor within 1 week.  Return to the emergency room if you develop any difficulty with your breathing, worsening pain in your chest, or lose consciousness.  ike we discussed, tomorrow you will likely feel more sore than you do today, and over the next few days you will continue to have pain and discomfort.  I have sent you prescriptions for medications.  You may begin taking those as prescribed.  Return to the emergency room if you develop difficulty with your breathing, lose consciousness, or are unable to walk.
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
## 2024-09-25 NOTE — ED Notes (Signed)
 Fast negative per Dr. Mannie

## 2024-09-25 NOTE — ED Notes (Signed)
 Lactic reported to cat rn by at

## 2024-09-26 ENCOUNTER — Encounter (HOSPITAL_COMMUNITY)

## 2024-09-26 NOTE — Progress Notes (Incomplete)
 ADVANCED HF CLINIC NOTE  PCP: [name], MD Cardiology: Dr. Wendel HF Cardiology: Dr. Rolan  HPI: 83 y.o. with history of CAD s/p CABG, ischemic cardiomyopathy, permanent atrial fibrillation, PAD, and CKD stage 3 was referred by Dr. Elmira for evaluation of CHF.  Patient is s/p CABG, last cath in 5/23 showed occluded native vessels with patent LIMA-LAD (occluded SVG-ramus and SVG-PDA), no interventional target.  Last echo in 8/24 showed EF 25-30%, moderate LVH, severe LV dilation, mild aortic stenosis, mild MR.  Patient has a Environmental manager ICD.  He has had atrial fibrillation that appears to be permanent; all ECGs since 2021 show atrial fibrillation.  He has PAD and is s/p aortofemoral bypass.  He has had VT episodes, the last was on 11/08/23.    RHC in 12/24 showed normal filling pressures, CI 2.55 by Fick.    S/p Barostim placement 02/23/24.  Seen by Dr. Wendel 07/18/24, has been feeling poorly with worsening dyspnea and pre-syncope. Echo showed LF/LG AS. Work up started for [employer]. Unable to have cMRI due to barostim device.  [facility] 8/25 showed a patent LIMA to the LAD with a 50% ostial stenosis.  There is severe native three-vessel disease with 90% restenosis within the previously placed left circumflex stent, he has known occlusion of his vein grafts; RA 6, PA 38/10 (20), PCWP 14 (with v waves up to 23 mmhg), CO/CI (Fick) 3.25/2.01, PVR 1.8 WU  and PAPi 4.7  S/p TAVR 09/05/24. Post op echo showed EF 45-50%, G2DD, normal RV, stable TAVR valve.  Today he returns for post hospital HF follow up with his wife. Overall feeling weak, has no appetite and legs are weak. He is able to walk with a cane around his house. No SOB. Denies palpitations, abnormal bleeding, CP,edema, or PND/Orthopnea. Weight at home 124 pounds. Taking all medications.   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  PMH: 1. PAD: H/o aortobifemoral bypass 2. CVA 3. CKD stage 3 4. Gout 5. Hypothyroidism 6. Hyperlipidemia 7. VT: Amiodarone  use. 8. Atrial fibrillation: Permanent, in atrial fibrillation since 2021.  9. CAD: S/p CABG.  - LHC (5/23): 99% prox/mid LCx (subtotal occlusion), occluded mid LAD, 95% D1, occluded RCA, occluded SVG-PDA, occluded SVG-ramus, patent LIMA-LAD with collaterals from LAD to RCA and LCx territories.  10. Chronic systolic CHF: Ischemic cardiomyopathy: [employer] ICD.  - Echo (8/24) with EF 25-30%, moderate LVH, severe LV dilation, mild aortic stenosis, mild MR.  - R/LHC (8/25): patent LIMA to the LAD with a 50% ostial stenosis.  There is severe native three-vessel disease with 90% restenosis within the previously placed left circumflex stent, he has known occlusion of his vein grafts; RA 6, PA 38/10 (20), PCWP 14 (with v waves up to 23 mmhg), CO/CI (Fick) 3.25/2.01, PVR 1.8 WU  and PAPi 4.7 11. Aortic stenosis: Mild on 8/24 echo.  - s/p TAVR 9/25  SH: Married, no smoking/ETOH.  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   Family History  Problem Relation Age of Onset   Heart disease Father    Heart attack Father    Heart disease Sister    Hypertension Sister  Heart attack Sister    Hypertension Mother    Diabetes Son    ROS: All systems reviewed and negative except as per HPI.   Current Outpatient Medications  Medication Sig Dispense Refill   ALPRAZolam  (XANAX ) 0.5 MG tablet Take 0.5 mg by mouth 3 (three) times daily as needed for anxiety or sleep.     amiodarone  (PACERONE ) 200 MG tablet Take 200 mg by mouth daily.     amoxicillin  (AMOXIL ) 500 MG tablet Take 4 tablets (2,000 mg total) by mouth  as directed. Please take 4 tablets 1 hour prior to dental work, including cleanings 12 tablet 12   apixaban  (ELIQUIS ) 5 MG TABS tablet Take 5 mg by mouth 2 (two) times daily.     bisacodyl  (DULCOLAX) 5 MG EC tablet Take 10 mg by mouth at bedtime.     colchicine  0.6 MG tablet Take 1 tablet (0.6 mg total) by mouth 2 (two) times daily. 60 tablet 3   Evolocumab  (REPATHA  SURECLICK) 140 MG/ML SOAJ Inject 140 mg into the skin every 14 (fourteen) days. 2 mL 0   isosorbide  mononitrate (IMDUR ) 30 MG 24 hr tablet Take 1 tablet (30 mg total) by mouth daily. 90 tablet 3   levothyroxine  (SYNTHROID ) 50 MCG tablet Take 1 tablet (50 mcg total) by mouth daily before breakfast. PLEASE SCHEDULE APPOINTMENT FOR MORE REFILLS 90 tablet 0   meclizine  (ANTIVERT ) 12.5 MG tablet TAKE 2 TABLETS BY MOUTH 3 TIMES A DAY AS NEEDED **FURTHER REFILLS NEED TO BE PRESCRIBED BY PRIMARY CARE PHYSICIAN** 180 tablet 0   methocarbamol  (ROBAXIN ) 500 MG tablet Take 500 mg by mouth daily as needed for muscle spasms (back pain.).     mirtazapine  (REMERON ) 15 MG tablet Take 15 mg by mouth at bedtime.     nitroGLYCERIN  (NITROSTAT ) 0.4 MG SL tablet Place 1 tablet (0.4 mg total) under the tongue every 5 (five) minutes as needed for chest pain. 25 tablet 3   ondansetron  (ZOFRAN ) 4 MG tablet Take 4 mg by mouth every 8 (eight) hours as needed for nausea or vomiting.     oxyCODONE  (ROXICODONE ) 5 MG immediate release tablet Take 1 tablet (5 mg total) by mouth every 6 (six) hours as needed. 8 tablet 0   pantoprazole  (PROTONIX ) 40 MG tablet Take 1 tablet (40 mg total) by mouth daily. 30 tablet 11   polyethylene glycol powder (GLYCOLAX /MIRALAX ) 17 GM/SCOOP powder Take 17 g by mouth at bedtime.     tobramycin  (TOBREX ) 0.3 % ophthalmic solution Place 1 drop into the left eye daily as needed (Dry eyes).     torsemide  (DEMADEX ) 20 MG tablet Take 1 tablet (20 mg total) by mouth daily as needed.     No current facility-administered medications for this visit.    Wt Readings from Last 3 Encounters:  09/25/24 56.7 kg (125 lb)  09/12/24 58 kg (127 lb 12.8 oz)  09/06/24 61.7 kg (136 lb 0.4 oz)   There were no vitals taken for this visit.  Physical Exam: General:  NAD. No resp difficulty, arrived in [facility], elderly HEENT: Normal Neck: Supple. No JVD. Cor: Irregular rate & rhythm. No rubs, gallops or murmurs. Lungs: Clear Abdomen: Soft, nontender, nondistended.  Extremities: No cyanosis, clubbing, rash, edema; groinssites stable without hematoma or ecchymosis  Neuro: Alert & oriented x 3, moves all 4 extremities w/o difficulty. Affect pleasant.  Assessment/Plan: 1. Chronic systolic CHF: Ischemic cardiomyopathy.  [employer] ICD.  Echo in 8/24 showed EF 25-30%, moderate LVH, severe LV dilation, mild  aortic stenosis, mild MR.  RHC in 12/24 showed normal filling pressures, CI 2.55. Echo 4/25 with hypertrophy and reduced mitral tissue, concerning for amyloidosis, EF 50-55%, mildly reduced RV. Unable to have cMRI due to barostim. S/P BaroStim placement with Dr. Serene. NYHA III, recent surgery and deconditioning likely contributing to functional status. He is not volume overloaded by exam or device interrogation, I suspect he may be hypovolemic based on symptoms with poor PO intake. - Stop Entresto  with low BP and orthostasis - Hold torsemide  tomorrow, may resume following day at 20 mg daily. - Continue digoxin  0.0625 every other day. Check dig level next visit - He failed both Jardiance  and Farxiga  due to nausea. - Off bisoprolol  with bradycardia 2. LFLG Aortic Stenosis: Echo 4/25 with LFLG AS with AVA 0.75cm2, DI 0.24, Vmax 2.1 m/s - now s/p TAVR 9/25. Groins sites stable without hematoma or ecchymosis  3. CAD: s/p CABG.  Cath in 5/23 without interventional option.  Severe disease of native vessels, occluded SVG-ramus and SVG-PDA, patent LIMA-LAD. No chest pain.  - No ASA given Eliquis  use.  - Continue Repatha .  4. Atrial fibrillation: Present  since 2021. Prev thought to be permanent, however ECGs from 2/25 and 7/25 showed SB with 1AVB.  - Continue apixaban , dosed at 2.5 bid for age > 59, creatinine usually > 1.5. No bleeding issues. CBC today. 5. CKD stage 3: baseline SCr 1.2. - BMET today. 6. VT: Last episode in 11/24 terminated by ATP.  No further VT on device interrogation. - Continue amio 100 mg daily. Follow LFTs and TSH. He will need a regular eye exam while on amiodarone . - ? If cause of nausea. 7. PAD: H/o aortobifemoral bypass.  He denies claudication.  - Continue statin.   Follow up in 2 weeks with APP  Harlene CHRISTELLA Gainer, FNP 09/26/2024  Acoramadis + cMRI

## 2024-09-27 ENCOUNTER — Ambulatory Visit: Payer: Medicare Other

## 2024-09-27 DIAGNOSIS — I5022 Chronic systolic (congestive) heart failure: Secondary | ICD-10-CM

## 2024-09-28 LAB — CUP PACEART REMOTE DEVICE CHECK
Battery Remaining Longevity: 138 mo
Battery Remaining Percentage: 95 %
Brady Statistic RV Percent Paced: 0 %
Date Time Interrogation Session: 20251008023100
HighPow Impedance: 52 Ohm
Implantable Lead Connection Status: 753985
Implantable Lead Implant Date: 20220110
Implantable Lead Location: 753860
Implantable Lead Model: 137
Implantable Lead Serial Number: 301176
Implantable Pulse Generator Implant Date: 20220110
Lead Channel Impedance Value: 471 Ohm
Lead Channel Setting Pacing Amplitude: 2.5 V
Lead Channel Setting Pacing Pulse Width: 0.4 ms
Lead Channel Setting Sensing Sensitivity: 0.5 mV
Pulse Gen Serial Number: 211464

## 2024-09-29 NOTE — Progress Notes (Signed)
 Remote ICD Transmission

## 2024-10-02 NOTE — Progress Notes (Signed)
 Remote ICD Transmission

## 2024-10-04 ENCOUNTER — Telehealth: Payer: Self-pay | Admitting: Internal Medicine

## 2024-10-04 ENCOUNTER — Ambulatory Visit: Payer: Self-pay | Admitting: Internal Medicine
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
## 2024-10-04 NOTE — Telephone Encounter (Signed)
 Left message for pt to call.

## 2024-10-09 ENCOUNTER — Ambulatory Visit (HOSPITAL_COMMUNITY)

## 2024-10-09 ENCOUNTER — Encounter: Payer: Self-pay | Admitting: Internal Medicine

## 2024-10-09 ENCOUNTER — Ambulatory Visit: Admitting: Physician Assistant

## 2024-10-09 NOTE — Telephone Encounter (Signed)
 Error
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
## 2024-10-18 ENCOUNTER — Other Ambulatory Visit: Payer: Self-pay | Admitting: Cardiology

## 2024-10-18 DIAGNOSIS — R42 Dizziness and giddiness: Secondary | ICD-10-CM

## 2024-10-18 NOTE — Telephone Encounter (Signed)
 I believe patient is seeing Dr. Wendel now. Okay to send 60 pills no refils until he can discuss with PCP.  Thanks MJP

## 2024-10-20 ENCOUNTER — Telehealth: Payer: Self-pay

## 2024-10-20 NOTE — Telephone Encounter (Signed)
 Alert received:  Alert remote transmission:  Antitachycardia pacing (ATP) therapy delivered to convert arrhythmia. Event occurred 10/29 @ 13:56, HR 169 duration 8sec, EGM c/w irregular R-R, ATP delivered x8 slowing rate Persistent AF per EPIC, Eliquis .    HL=31  Rhythm appears to be atrially driven.    Outreach made to Pt.  Per Pt during this time he was at the front door when he felt his heart start racing and he became dizzy.   He laid down and after a few minutes it passed.  He feels well today.    Pt states he is currently taking amiodarone  200 mg one tablet by mouth daily.  Advised would send message to Dr. Waddell to review and call Pt back with any recommendations.

## 2024-10-20 NOTE — Telephone Encounter (Signed)
 Pts wife calling for more information on what amiodarone  is for. Please advise.

## 2024-10-21 ENCOUNTER — Other Ambulatory Visit: Payer: Self-pay

## 2024-10-21 ENCOUNTER — Encounter (HOSPITAL_COMMUNITY): Payer: Self-pay

## 2024-10-21 ENCOUNTER — Inpatient Hospital Stay (HOSPITAL_COMMUNITY)
Admission: EM | Admit: 2024-10-21 | Discharge: 2024-10-22 | DRG: 291 | Disposition: A | Discharge status: Home / Self Care | Attending: Family Medicine | Admitting: Family Medicine

## 2024-10-21 ENCOUNTER — Emergency Department (HOSPITAL_COMMUNITY)

## 2024-10-21 DIAGNOSIS — R0989 Other specified symptoms and signs involving the circulatory and respiratory systems: Secondary | ICD-10-CM | POA: Diagnosis not present

## 2024-10-21 DIAGNOSIS — Z95810 Presence of automatic (implantable) cardiac defibrillator: Secondary | ICD-10-CM

## 2024-10-21 DIAGNOSIS — Z87442 Personal history of urinary calculi: Secondary | ICD-10-CM

## 2024-10-21 DIAGNOSIS — I130 Hypertensive heart and chronic kidney disease with heart failure and stage 1 through stage 4 chronic kidney disease, or unspecified chronic kidney disease: Principal | ICD-10-CM | POA: Diagnosis present

## 2024-10-21 DIAGNOSIS — I2510 Atherosclerotic heart disease of native coronary artery without angina pectoris: Secondary | ICD-10-CM | POA: Diagnosis present

## 2024-10-21 DIAGNOSIS — E039 Hypothyroidism, unspecified: Secondary | ICD-10-CM | POA: Diagnosis present

## 2024-10-21 DIAGNOSIS — I2489 Other forms of acute ischemic heart disease: Secondary | ICD-10-CM | POA: Diagnosis present

## 2024-10-21 DIAGNOSIS — Z79899 Other long term (current) drug therapy: Secondary | ICD-10-CM

## 2024-10-21 DIAGNOSIS — I959 Hypotension, unspecified: Secondary | ICD-10-CM | POA: Diagnosis not present

## 2024-10-21 DIAGNOSIS — I252 Old myocardial infarction: Secondary | ICD-10-CM

## 2024-10-21 DIAGNOSIS — I5043 Acute on chronic combined systolic (congestive) and diastolic (congestive) heart failure: Secondary | ICD-10-CM | POA: Diagnosis present

## 2024-10-21 DIAGNOSIS — R0603 Acute respiratory distress: Secondary | ICD-10-CM

## 2024-10-21 DIAGNOSIS — M549 Dorsalgia, unspecified: Secondary | ICD-10-CM | POA: Diagnosis present

## 2024-10-21 DIAGNOSIS — Z833 Family history of diabetes mellitus: Secondary | ICD-10-CM

## 2024-10-21 DIAGNOSIS — I4821 Permanent atrial fibrillation: Secondary | ICD-10-CM | POA: Diagnosis present

## 2024-10-21 DIAGNOSIS — R7989 Other specified abnormal findings of blood chemistry: Secondary | ICD-10-CM | POA: Diagnosis present

## 2024-10-21 DIAGNOSIS — J810 Acute pulmonary edema: Secondary | ICD-10-CM | POA: Diagnosis not present

## 2024-10-21 DIAGNOSIS — Z79890 Hormone replacement therapy (postmenopausal): Secondary | ICD-10-CM

## 2024-10-21 DIAGNOSIS — I255 Ischemic cardiomyopathy: Secondary | ICD-10-CM | POA: Diagnosis present

## 2024-10-21 DIAGNOSIS — N1831 Chronic kidney disease, stage 3a: Secondary | ICD-10-CM | POA: Diagnosis present

## 2024-10-21 DIAGNOSIS — Z951 Presence of aortocoronary bypass graft: Secondary | ICD-10-CM

## 2024-10-21 DIAGNOSIS — D631 Anemia in chronic kidney disease: Secondary | ICD-10-CM | POA: Diagnosis present

## 2024-10-21 DIAGNOSIS — R0602 Shortness of breath: Secondary | ICD-10-CM | POA: Diagnosis not present

## 2024-10-21 DIAGNOSIS — D72829 Elevated white blood cell count, unspecified: Secondary | ICD-10-CM | POA: Diagnosis present

## 2024-10-21 DIAGNOSIS — Z860100 Personal history of colon polyps, unspecified: Secondary | ICD-10-CM

## 2024-10-21 DIAGNOSIS — M109 Gout, unspecified: Secondary | ICD-10-CM | POA: Diagnosis present

## 2024-10-21 DIAGNOSIS — E785 Hyperlipidemia, unspecified: Secondary | ICD-10-CM | POA: Diagnosis present

## 2024-10-21 DIAGNOSIS — R3915 Urgency of urination: Secondary | ICD-10-CM | POA: Diagnosis present

## 2024-10-21 DIAGNOSIS — Z87891 Personal history of nicotine dependence: Secondary | ICD-10-CM

## 2024-10-21 DIAGNOSIS — Z885 Allergy status to narcotic agent status: Secondary | ICD-10-CM

## 2024-10-21 DIAGNOSIS — J9601 Acute respiratory failure with hypoxia: Secondary | ICD-10-CM | POA: Diagnosis present

## 2024-10-21 DIAGNOSIS — Z7901 Long term (current) use of anticoagulants: Secondary | ICD-10-CM

## 2024-10-21 DIAGNOSIS — Z8673 Personal history of transient ischemic attack (TIA), and cerebral infarction without residual deficits: Secondary | ICD-10-CM

## 2024-10-21 DIAGNOSIS — Z8249 Family history of ischemic heart disease and other diseases of the circulatory system: Secondary | ICD-10-CM

## 2024-10-21 DIAGNOSIS — I5023 Acute on chronic systolic (congestive) heart failure: Secondary | ICD-10-CM | POA: Diagnosis not present

## 2024-10-21 DIAGNOSIS — I4901 Ventricular fibrillation: Secondary | ICD-10-CM | POA: Diagnosis not present

## 2024-10-21 DIAGNOSIS — G8929 Other chronic pain: Secondary | ICD-10-CM | POA: Diagnosis present

## 2024-10-21 DIAGNOSIS — R062 Wheezing: Secondary | ICD-10-CM | POA: Diagnosis not present

## 2024-10-21 DIAGNOSIS — J90 Pleural effusion, not elsewhere classified: Secondary | ICD-10-CM | POA: Diagnosis present

## 2024-10-21 DIAGNOSIS — Z555 Less than a high school diploma: Secondary | ICD-10-CM

## 2024-10-21 DIAGNOSIS — K219 Gastro-esophageal reflux disease without esophagitis: Secondary | ICD-10-CM | POA: Diagnosis present

## 2024-10-21 DIAGNOSIS — Z96643 Presence of artificial hip joint, bilateral: Secondary | ICD-10-CM | POA: Diagnosis present

## 2024-10-21 DIAGNOSIS — R918 Other nonspecific abnormal finding of lung field: Secondary | ICD-10-CM | POA: Diagnosis not present

## 2024-10-21 DIAGNOSIS — I4891 Unspecified atrial fibrillation: Secondary | ICD-10-CM | POA: Diagnosis not present

## 2024-10-21 DIAGNOSIS — Z953 Presence of xenogenic heart valve: Secondary | ICD-10-CM

## 2024-10-21 DIAGNOSIS — Z886 Allergy status to analgesic agent status: Secondary | ICD-10-CM

## 2024-10-21 DIAGNOSIS — F419 Anxiety disorder, unspecified: Secondary | ICD-10-CM | POA: Diagnosis present

## 2024-10-21 DIAGNOSIS — M791 Myalgia, unspecified site: Secondary | ICD-10-CM | POA: Diagnosis present

## 2024-10-21 DIAGNOSIS — N400 Enlarged prostate without lower urinary tract symptoms: Secondary | ICD-10-CM | POA: Diagnosis present

## 2024-10-21 DIAGNOSIS — Z8616 Personal history of COVID-19: Secondary | ICD-10-CM

## 2024-10-21 DIAGNOSIS — Z888 Allergy status to other drugs, medicaments and biological substances status: Secondary | ICD-10-CM

## 2024-10-21 LAB — COMPREHENSIVE METABOLIC PANEL WITH GFR
ALT: 14 U/L (ref 0–44)
AST: 33 U/L (ref 15–41)
Albumin: 3.4 g/dL — ABNORMAL LOW (ref 3.5–5.0)
Alkaline Phosphatase: 93 U/L (ref 38–126)
Anion gap: 15 (ref 5–15)
BUN: 20 mg/dL (ref 8–23)
CO2: 21 mmol/L — ABNORMAL LOW (ref 22–32)
Calcium: 8.6 mg/dL — ABNORMAL LOW (ref 8.9–10.3)
Chloride: 103 mmol/L (ref 98–111)
Creatinine, Ser: 1.56 mg/dL — ABNORMAL HIGH (ref 0.61–1.24)
GFR, Estimated: 44 mL/min — ABNORMAL LOW (ref >60)
Glucose, Bld: 161 mg/dL — ABNORMAL HIGH (ref 70–99)
Potassium: 3.5 mmol/L (ref 3.5–5.1)
Sodium: 139 mmol/L (ref 135–145)
Total Bilirubin: 0.7 mg/dL (ref 0.0–1.2)
Total Protein: 7.1 g/dL (ref 6.5–8.1)

## 2024-10-21 LAB — CBC
HCT: 39.4 % (ref 39.0–52.0)
Hemoglobin: 12.5 g/dL — ABNORMAL LOW (ref 13.0–17.0)
MCH: 31.1 pg (ref 26.0–34.0)
MCHC: 31.7 g/dL (ref 30.0–36.0)
MCV: 98 fL (ref 80.0–100.0)
Platelets: 275 K/uL (ref 150–400)
RBC: 4.02 MIL/uL — ABNORMAL LOW (ref 4.22–5.81)
RDW: 13.7 % (ref 11.5–15.5)
WBC: 14.5 K/uL — ABNORMAL HIGH (ref 4.0–10.5)
nRBC: 0 % (ref 0.0–0.2)

## 2024-10-21 LAB — BRAIN NATRIURETIC PEPTIDE: B Natriuretic Peptide: 1575.1 pg/mL — ABNORMAL HIGH (ref 0.0–100.0)

## 2024-10-21 LAB — TROPONIN I (HIGH SENSITIVITY): Troponin I (High Sensitivity): 67 ng/L — ABNORMAL HIGH (ref <18)

## 2024-10-21 MED ORDER — AMIODARONE LOAD VIA INFUSION: 150.0000 mg | Freq: Once | INTRAVENOUS | Status: DC

## 2024-10-21 MED ORDER — AMIODARONE IV BOLUS ONLY 150 MG/100ML
INTRAVENOUS | Status: AC
  Administered 2024-10-21: 150 mg
  Filled 2024-10-21: qty 100

## 2024-10-21 MED ORDER — AMIODARONE HCL IN DEXTROSE 360-4.14 MG/200ML-% IV SOLN: 30.0000 mg/h | INTRAVENOUS | Status: DC

## 2024-10-21 MED ORDER — FUROSEMIDE 10 MG/ML IJ SOLN
40.0000 mg | Freq: Once | INTRAMUSCULAR | Status: AC
  Administered 2024-10-21: 40 mg via INTRAVENOUS
  Filled 2024-10-21: qty 4

## 2024-10-21 MED ORDER — NITROGLYCERIN IN D5W 200-5 MCG/ML-% IV SOLN
0.0000 ug/min | INTRAVENOUS | Status: DC
  Filled 2024-10-21: qty 250

## 2024-10-21 MED ORDER — MAGNESIUM SULFATE 2 GM/50ML IV SOLN
INTRAVENOUS | Status: AC
  Administered 2024-10-21: 2 g
  Filled 2024-10-21: qty 50

## 2024-10-21 MED ORDER — AMIODARONE HCL IN DEXTROSE 360-4.14 MG/200ML-% IV SOLN
60.0000 mg/h | INTRAVENOUS | Status: AC
  Administered 2024-10-21: 60 mg/h via INTRAVENOUS
  Filled 2024-10-21: qty 200
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
## 2024-10-22 ENCOUNTER — Other Ambulatory Visit: Payer: Self-pay

## 2024-10-22 DIAGNOSIS — Z953 Presence of xenogenic heart valve: Secondary | ICD-10-CM | POA: Diagnosis not present

## 2024-10-22 DIAGNOSIS — N400 Enlarged prostate without lower urinary tract symptoms: Secondary | ICD-10-CM | POA: Diagnosis not present

## 2024-10-22 DIAGNOSIS — E039 Hypothyroidism, unspecified: Secondary | ICD-10-CM | POA: Diagnosis not present

## 2024-10-22 DIAGNOSIS — J9601 Acute respiratory failure with hypoxia: Secondary | ICD-10-CM | POA: Diagnosis not present

## 2024-10-22 DIAGNOSIS — Z79890 Hormone replacement therapy (postmenopausal): Secondary | ICD-10-CM | POA: Diagnosis not present

## 2024-10-22 DIAGNOSIS — N1831 Chronic kidney disease, stage 3a: Secondary | ICD-10-CM | POA: Diagnosis not present

## 2024-10-22 DIAGNOSIS — J810 Acute pulmonary edema: Secondary | ICD-10-CM | POA: Diagnosis not present

## 2024-10-22 DIAGNOSIS — I4891 Unspecified atrial fibrillation: Secondary | ICD-10-CM | POA: Diagnosis not present

## 2024-10-22 DIAGNOSIS — I255 Ischemic cardiomyopathy: Secondary | ICD-10-CM | POA: Diagnosis not present

## 2024-10-22 DIAGNOSIS — D631 Anemia in chronic kidney disease: Secondary | ICD-10-CM | POA: Diagnosis not present

## 2024-10-22 DIAGNOSIS — R0603 Acute respiratory distress: Secondary | ICD-10-CM | POA: Diagnosis not present

## 2024-10-22 DIAGNOSIS — I5043 Acute on chronic combined systolic (congestive) and diastolic (congestive) heart failure: Secondary | ICD-10-CM | POA: Diagnosis not present

## 2024-10-22 DIAGNOSIS — I2489 Other forms of acute ischemic heart disease: Secondary | ICD-10-CM | POA: Diagnosis not present

## 2024-10-22 DIAGNOSIS — Z833 Family history of diabetes mellitus: Secondary | ICD-10-CM | POA: Diagnosis not present

## 2024-10-22 DIAGNOSIS — M109 Gout, unspecified: Secondary | ICD-10-CM | POA: Diagnosis not present

## 2024-10-22 DIAGNOSIS — Z7901 Long term (current) use of anticoagulants: Secondary | ICD-10-CM | POA: Diagnosis not present

## 2024-10-22 DIAGNOSIS — I130 Hypertensive heart and chronic kidney disease with heart failure and stage 1 through stage 4 chronic kidney disease, or unspecified chronic kidney disease: Secondary | ICD-10-CM | POA: Diagnosis not present

## 2024-10-22 DIAGNOSIS — J90 Pleural effusion, not elsewhere classified: Secondary | ICD-10-CM | POA: Diagnosis not present

## 2024-10-22 DIAGNOSIS — I959 Hypotension, unspecified: Secondary | ICD-10-CM | POA: Diagnosis not present

## 2024-10-22 DIAGNOSIS — Z87891 Personal history of nicotine dependence: Secondary | ICD-10-CM | POA: Diagnosis not present

## 2024-10-22 DIAGNOSIS — Z8616 Personal history of COVID-19: Secondary | ICD-10-CM | POA: Diagnosis not present

## 2024-10-22 DIAGNOSIS — I4821 Permanent atrial fibrillation: Secondary | ICD-10-CM | POA: Diagnosis not present

## 2024-10-22 DIAGNOSIS — Z8249 Family history of ischemic heart disease and other diseases of the circulatory system: Secondary | ICD-10-CM | POA: Diagnosis not present

## 2024-10-22 DIAGNOSIS — Z951 Presence of aortocoronary bypass graft: Secondary | ICD-10-CM | POA: Diagnosis not present

## 2024-10-22 DIAGNOSIS — I5023 Acute on chronic systolic (congestive) heart failure: Secondary | ICD-10-CM | POA: Diagnosis not present

## 2024-10-22 DIAGNOSIS — E785 Hyperlipidemia, unspecified: Secondary | ICD-10-CM | POA: Diagnosis not present

## 2024-10-22 DIAGNOSIS — I2510 Atherosclerotic heart disease of native coronary artery without angina pectoris: Secondary | ICD-10-CM | POA: Diagnosis not present

## 2024-10-22 DIAGNOSIS — K219 Gastro-esophageal reflux disease without esophagitis: Secondary | ICD-10-CM | POA: Diagnosis not present

## 2024-10-22 LAB — CBC
HCT: 32.8 % — ABNORMAL LOW (ref 39.0–52.0)
Hemoglobin: 10.6 g/dL — ABNORMAL LOW (ref 13.0–17.0)
MCH: 31.3 pg (ref 26.0–34.0)
MCHC: 32.3 g/dL (ref 30.0–36.0)
MCV: 96.8 fL (ref 80.0–100.0)
Platelets: 209 K/uL (ref 150–400)
RBC: 3.39 MIL/uL — ABNORMAL LOW (ref 4.22–5.81)
RDW: 13.9 % (ref 11.5–15.5)
WBC: 11 K/uL — ABNORMAL HIGH (ref 4.0–10.5)
nRBC: 0 % (ref 0.0–0.2)

## 2024-10-22 LAB — BASIC METABOLIC PANEL WITH GFR
Anion gap: 12 (ref 5–15)
BUN: 22 mg/dL (ref 8–23)
CO2: 22 mmol/L (ref 22–32)
Calcium: 8.3 mg/dL — ABNORMAL LOW (ref 8.9–10.3)
Chloride: 104 mmol/L (ref 98–111)
Creatinine, Ser: 1.59 mg/dL — ABNORMAL HIGH (ref 0.61–1.24)
GFR, Estimated: 43 mL/min — ABNORMAL LOW (ref >60)
Glucose, Bld: 118 mg/dL — ABNORMAL HIGH (ref 70–99)
Potassium: 4 mmol/L (ref 3.5–5.1)
Sodium: 138 mmol/L (ref 135–145)

## 2024-10-22 LAB — CBC WITH DIFFERENTIAL/PLATELET
Abs Immature Granulocytes: 0.07 K/uL (ref 0.00–0.07)
Basophils Absolute: 0.1 K/uL (ref 0.0–0.1)
Basophils Relative: 1 %
Eosinophils Absolute: 0.1 K/uL (ref 0.0–0.5)
Eosinophils Relative: 1 %
HCT: 38.2 % — ABNORMAL LOW (ref 39.0–52.0)
Hemoglobin: 12 g/dL — ABNORMAL LOW (ref 13.0–17.0)
Immature Granulocytes: 1 %
Lymphocytes Relative: 25 %
Lymphs Abs: 3.4 K/uL (ref 0.7–4.0)
MCH: 31.3 pg (ref 26.0–34.0)
MCHC: 31.4 g/dL (ref 30.0–36.0)
MCV: 99.7 fL (ref 80.0–100.0)
Monocytes Absolute: 0.8 K/uL (ref 0.1–1.0)
Monocytes Relative: 6 %
Neutro Abs: 9.1 K/uL — ABNORMAL HIGH (ref 1.7–7.7)
Neutrophils Relative %: 66 %
Platelets: 264 K/uL (ref 150–400)
RBC: 3.83 MIL/uL — ABNORMAL LOW (ref 4.22–5.81)
RDW: 13.8 % (ref 11.5–15.5)
WBC: 13.5 K/uL — ABNORMAL HIGH (ref 4.0–10.5)
nRBC: 0 % (ref 0.0–0.2)

## 2024-10-22 LAB — LACTIC ACID, PLASMA
Lactic Acid, Venous: 2.2 mmol/L (ref 0.5–1.9)
Lactic Acid, Venous: 2.1 mmol/L (ref 0.5–1.9)

## 2024-10-22 LAB — TROPONIN I (HIGH SENSITIVITY): Troponin I (High Sensitivity): 69 ng/L — ABNORMAL HIGH (ref <18)

## 2024-10-22 LAB — MAGNESIUM: Magnesium: 2.1 mg/dL (ref 1.7–2.4)

## 2024-10-22 MED ORDER — ACETAMINOPHEN 325 MG PO TABS: 650.0000 mg | ORAL_TABLET | Freq: Four times a day (QID) | ORAL | Status: DC | PRN

## 2024-10-22 MED ORDER — ISOSORBIDE MONONITRATE ER 30 MG PO TB24: 30.0000 mg | ORAL_TABLET | Freq: Every day | ORAL | 0 refills | Status: DC

## 2024-10-22 MED ORDER — TORSEMIDE 20 MG PO TABS: 20.0000 mg | ORAL_TABLET | Freq: Every day | ORAL | Status: DC

## 2024-10-22 MED ORDER — PANTOPRAZOLE SODIUM 40 MG PO TBEC
40.0000 mg | DELAYED_RELEASE_TABLET | Freq: Every day | ORAL | Status: DC
  Administered 2024-10-22: 40 mg via ORAL
  Filled 2024-10-22: qty 1

## 2024-10-22 MED ORDER — AMIODARONE HCL 200 MG PO TABS
200.0000 mg | ORAL_TABLET | Freq: Every day | ORAL | Status: DC
  Administered 2024-10-22: 200 mg via ORAL
  Filled 2024-10-22: qty 1

## 2024-10-22 MED ORDER — APIXABAN 5 MG PO TABS
5.0000 mg | ORAL_TABLET | Freq: Two times a day (BID) | ORAL | Status: DC
  Administered 2024-10-22: 5 mg via ORAL
  Filled 2024-10-22: qty 1

## 2024-10-22 MED ORDER — ACETAMINOPHEN 650 MG RE SUPP: 650.0000 mg | Freq: Four times a day (QID) | RECTAL | Status: DC | PRN

## 2024-10-22 MED ORDER — APIXABAN 2.5 MG PO TABS: 2.5000 mg | ORAL_TABLET | Freq: Two times a day (BID) | ORAL | 1 refills | Status: DC

## 2024-10-22 NOTE — Progress Notes (Signed)
 [facility] night cross cover note:   I followed up on this patient's blood pressure after he became hypotensive in the ED, with SBP's in the 70's, following initiation of amiodarone  drip as well as BiPAP in the setting of his presenting acute on chronic combined heart failure as well as atrial fibrillation with RVR.   In the setting of his hypotension, amiodarone  drip was subsequently stopped.  While patient appears to remain in atrial fibrillation, rate control is now much improved, with systolic blood pressures now in the 60s to 70s.   Additionally, in the setting of his hypotension, the patient was trialed off of BiPAP and has subsequently been maintaining oxygen saturations in the mid 90s on room air.   I evaluated the patient at bedside.  He appears comfortable at this time, without any evidence of acute respiratory distress.  The patient reports that the shortness of breath with which she presented is significantly improved, and has not been worsening since discontinuation of the BiPAP.   With the above measures patient's systolic blood pressure has been improving, with systolic blood pressures into the low 100s mmHg. Given improving blood pressures, significant symptomatic improvement, and patient now maintaining oxygen saturations in the mid 90s on room air, I've held off reaching out to PCCM. Cardiology is consulting in the setting of presenting suspected acute on chronic systolic/diastolic heart failure along with atrial fibrillation with RVR.     Eva Pore, DO Hospitalist

## 2024-10-22 NOTE — Progress Notes (Addendum)
 Seen and examined.  His heart rate and blood pressure are at baseline.  He is feeling well.  As he is no longer short of breath, no further cardiac workup is necessary.  He would be okay for discharge from our perspective.  He has been having some intermittent chest discomfort.  He has a history of severe coronary artery disease and is post CABG.  He would benefit from increasing Imdur  to 60 mg.  Soyla Norton, MD

## 2024-10-22 NOTE — ED Notes (Signed)
 Attending contacted for hypotension. Encouraged to reach out to floor coverage instead.
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
## 2024-10-22 NOTE — ED Notes (Signed)
 Attending Provider [place], MD at bedside.

## 2024-10-22 NOTE — Care Management (Signed)
  Transition of Care [facility]) Screening Note   Patient Details  Name: Ryan [name] Date of Birth: 12/15/1941   [facility] of [facility] CM/SW Contact:    Corean JAYSON Canary, RN Phone Number: 10/22/2024, 2:44 PM    Transition of Care Department [facility]) has reviewed patient and no TOC needs have been identified at this time. We will continue to monitor patient advancement through interdisciplinary progression rounds. If new patient transition needs arise, please place a TOC consult.

## 2024-10-22 NOTE — ED Notes (Signed)
Attending Provider at bedside. 
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
## 2024-10-22 NOTE — ED Notes (Signed)
 Trial-ing patient off of Bipap.   Pt reports relief and improvement from arrival.   Displays no signs of distress

## 2024-10-22 NOTE — ED Notes (Signed)
 Pt provided discharge instructions and prescription information. Pt was given the opportunity to ask questions and questions were answered.

## 2024-10-22 NOTE — H&P (Signed)
 History and Physical    Ryan [name] FMW:[id number] DOB: June 15, 1941 DOA: 10/21/2024  PCP: Ryan Elsie [name], MD  Patient coming from: Home  Chief Complaint: Shortness of breath  HPI: Ryan [name] is a 83 y.o. male with medical history significant of CAD status post CABG, chronic combined CHF/ischemic cardiomyopathy, VT, status post ICD, persistent A-fib on Eliquis , history of aortic valve replacement, PAD status post aortobifemoral bypass, carotid artery disease, CKD stage IIIa, CVA, hyperlipidemia, gout, BPH, anemia, DVT/PE, GERD, hypothyroidism, anxiety presented to the ED via EMS for evaluation of shortness of breath.  En route with EMS, patient went into a wide-complex rhythm with a rate of 170 and became hypoxic requiring a nonrebreather.  On arrival to the ED, patient noted to be in A-fib with RVR with heart rate 150-160s, tachypneic to the 30s, and hypertensive.  He was placed on BiPAP.  Labs notable for WBC count 13.5, hemoglobin 12.0 (stable), creatinine 1.5 (stable since labs a month ago but baseline creatinine around 1.2), troponin 67, BNP 1575.  Chest x-ray showing pulmonary edema and small bilateral pleural effusion.  Patient was given IV Lasix  40 mg, IV mag 2 g, and started on amiodarone  infusion.  TR[facility] called to admit.  History limited as patient is currently on BiPAP.  He is reporting 1 week history of progressively worsening dyspnea, cough, and orthopnea.  Denies chest pain.  He is taking torsemide  every other day but not sure about the dose.  Also reports taking amiodarone  and Eliquis .  No other complaints.  Review of Systems:  Review of Systems  All other systems reviewed and are negative.   Past Medical History:  Diagnosis Date   Acquired thrombophilia 11/01/2021   AICD (automatic cardioverter/defibrillator) present    [employer]   Arthritis    CAD (coronary artery disease)    Carotid artery occlusion    Cataract    Bil eyes/worse in left eye   CHF  (congestive heart failure) (HCC)    Chronic back pain    COVID-19 10/31/2021   DVT (deep venous thrombosis) (HCC)    Enlarged prostate    takes Rapaflo daily   GERD (gastroesophageal reflux disease)    occasional   History of colon polyps    History of gout    has colchicine  prn   History of kidney stones    Hyperlipidemia    takes Crestor  daily   Hypertension    takes Amlodipine  daily   Hypothyroidism    Myocardial infarction [facility])    Peripheral vascular disease    Prolonged QT interval 11/01/2021   Pulmonary emboli (HCC) 03/20/2015   elevated d-dimer, intermediate V/Q study, atypical chest pain and SOB. 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 Wendel and Dr. Lucas and Dr. Daniel   Shortness of breath dyspnea    Urinary frequency    Urinary urgency     Past Surgical History:  Procedure Laterality Date   APPENDECTOMY     BACK SURGERY     5 times   big toe surgery     CARDIAC CATHETERIZATION     2010    dr alveta   cataract surgery     left eye   CHOLECYSTECTOMY N/A 07/27/2016   Procedure: LAPAROSCOPIC  CHOLECYSTECTOMY;  Surgeon: Herlene Beverley Bureau, MD;  Location: [facility] OR;  Service: General;  Laterality: N/A;   COLONOSCOPY     CORONARY ARTERY BYPASS GRAFT N/A 04/05/2015   Procedure: CORONARY ARTERY BYPASS GRAFTING (CABG)X4 LIMA-LAD; SVG-DIAG1-DIAG2; SVG-PD;  Surgeon: Elspeth JAYSON Millers, MD;  Location: [facility] OR;  Service: Open Heart Surgery;  Laterality: N/A;   CORONARY BALLOON ANGIOPLASTY N/A 04/14/2022   Procedure: CORONARY BALLOON ANGIOPLASTY;  Surgeon: Elmira Newman PARAS, MD;  Location: [facility] INVASIVE CV LAB;  Service: Cardiovascular;  Laterality: N/A;   CORONARY ULTRASOUND/IVUS N/A 04/14/2022   Procedure: Intravascular Ultrasound/IVUS;  Surgeon: Elmira Newman PARAS, MD;   Location: [facility] INVASIVE CV LAB;  Service: Cardiovascular;  Laterality: N/A;   CORONARY/GRAFT ANGIOGRAPHY N/A 04/22/2018   Procedure: CORONARY/GRAFT ANGIOGRAPHY;  Surgeon: Jordan, Peter M, MD;  Location: [facility] INVASIVE CV LAB;  Service: Cardiovascular;  Laterality: N/A;   CYSTOSCOPY     ENDARTERECTOMY Left 04/24/2016   Procedure: ENDARTERECTOMY LEFT CAROTID;  Surgeon: Lynwood JONETTA Collum, MD;  Location: [facility] OR;  Service: Vascular;  Laterality: Left;   EYE SURGERY     FEMORAL ARTERY - POPLITEAL ARTERY BYPASS GRAFT     ICD IMPLANT N/A 12/30/2020   Procedure: ICD IMPLANT;  Surgeon: Waddell Danelle ORN, MD;  Location: [facility] INVASIVE CV LAB;  Service: Cardiovascular;  Laterality: N/A;   INTRAOPERATIVE TRANSTHORACIC ECHOCARDIOGRAM N/A 09/05/2024   Procedure: ECHOCARDIOGRAM, TRANSTHORACIC;  Surgeon: Wendel Lurena POUR, MD;  Location: [facility] INVASIVE CV LAB;  Service: Cardiovascular;  Laterality: N/A;   JOINT REPLACEMENT     shoulder   LEFT HEART CATH N/A 04/14/2022   Procedure: Left Heart Cath;  Surgeon: Elmira Newman PARAS, MD;  Location: [facility] INVASIVE CV LAB;  Service: Cardiovascular;  Laterality: N/A;   LEFT HEART CATH AND CORONARY ANGIOGRAPHY N/A 04/24/2022   Procedure: LEFT HEART CATH AND CORONARY ANGIOGRAPHY;  Surgeon: Ladona Heinz, MD;  Location: [facility] INVASIVE CV LAB;  Service: Cardiovascular;  Laterality: N/A;   LEFT HEART CATHETERIZATION WITH CORONARY ANGIOGRAM N/A 04/03/2015   Procedure: LEFT HEART CATHETERIZATION WITH CORONARY ANGIOGRAM;  Surgeon: Debby DELENA Sor, MD;  Location: [facility] CATH LAB;  Service: Cardiovascular;  Laterality: N/A;   LUMBAR LAMINECTOMY  01/06/2013   Procedure: MICRODISCECTOMY LUMBAR LAMINECTOMY;  Surgeon: Oneil JAYSON Herald, MD;  Location: [facility] OR;  Service: Orthopedics;  Laterality: N/A;  L3-4 decompression   LUMBAR LAMINECTOMY/DECOMPRESSION MICRODISCECTOMY  02/12/2012   Procedure: LUMBAR LAMINECTOMY/DECOMPRESSION MICRODISCECTOMY;  Surgeon: Catalina CHRISTELLA Stains, MD;  Location: [facility] NEURO ORS;  Service: Neurosurgery;   Laterality: N/A;  Lumbar four-five laminectomy   PATCH ANGIOPLASTY Left 04/24/2016   Procedure: LEFT CAROTID ARTERY PATCH ANGIOPLASTY;  Surgeon: Lynwood JONETTA Collum, MD;  Location: [facility] OR;  Service: Vascular;  Laterality: Left;   RIGHT HEART CATH N/A 11/22/2023   Procedure: RIGHT HEART CATH;  Surgeon: Rolan Ezra RAMAN, MD;  Location: [facility] INVASIVE CV LAB;  Service: Cardiovascular;  Laterality: N/A;   RIGHT HEART CATH AND CORONARY/GRAFT ANGIOGRAPHY N/A 01/30/2019   Procedure: RIGHT HEART CATH AND CORONARY/GRAFT ANGIOGRAPHY;  Surgeon: Sor Debby DELENA, MD;  Location: [facility] INVASIVE CV LAB;  Service: Cardiovascular;  Laterality: N/A;   RIGHT/LEFT HEART CATH AND CORONARY ANGIOGRAPHY N/A 03/17/2022   Procedure: RIGHT/LEFT HEART CATH AND CORONARY ANGIOGRAPHY;  Surgeon: Elmira Newman PARAS, MD;  Location: [facility] INVASIVE CV LAB;  Service: Cardiovascular;  Laterality: N/A;   RIGHT/LEFT HEART CATH AND CORONARY/GRAFT ANGIOGRAPHY N/A 07/27/2024   Procedure: RIGHT/LEFT HEART CATH AND CORONARY/GRAFT ANGIOGRAPHY;  Surgeon: Wendel Lurena POUR, MD;  Location: [facility] INVASIVE CV  LAB;  Service: Cardiovascular;  Laterality: N/A;   STERIOD INJECTION Right 01/09/2014   Procedure: STEROID INJECTION;  Surgeon: Lonni CINDERELLA Poli, MD;  Location: [facility] OR;  Service: Orthopedics;  Laterality: Right;   TEE WITHOUT CARDIOVERSION N/A 04/05/2015   Procedure: TRANSESOPHAGEAL ECHOCARDIOGRAM (TEE);  Surgeon: Elspeth JAYSON Millers, MD;  Location: [facility] OR;  Service: Open Heart Surgery;  Laterality: N/A;   TOTAL HIP ARTHROPLASTY Left 01/09/2014   DR POLI   TOTAL HIP ARTHROPLASTY Left 01/09/2014   Procedure: LEFT TOTAL HIP ARTHROPLASTY ANTERIOR APPROACH and Steroid Injection Right hip;  Surgeon: Lonni CINDERELLA Poli, MD;  Location: [facility] OR;  Service: Orthopedics;  Laterality: Left;   TOTAL HIP ARTHROPLASTY Right 08/15/2019   TOTAL HIP ARTHROPLASTY Right 08/15/2019   Procedure: RIGHT TOTAL HIP ARTHROPLASTY ANTERIOR APPROACH;  Surgeon: Poli Lonni CINDERELLA,  MD;  Location: [facility] OR;  Service: Orthopedics;  Laterality: Right;     reports that he quit smoking about 37 years ago. His smoking use included cigarettes. He started smoking about 62 years ago. He has a 25 pack-year smoking history. He quit smokeless tobacco use about 15 years ago.  His smokeless tobacco use included chew. He reports that he does not drink alcohol  and does not use drugs.  Allergies  Allergen Reactions   Jardiance  [Empagliflozin ] Nausea Only and Other (See Comments)    Made patient very sick to the stomach.   Zetia  [Ezetimibe ] Other (See Comments)    Myalgias    Ace Inhibitors Cough    Other reaction(s): cough   Aspirin  Other (See Comments)    skin rash/easy bruising   Crestor  [Rosuvastatin ] Other (See Comments)    Myalgias/weakness   Lipitor  [Atorvastatin ] Other (See Comments)    weakness, fatigue (severe)   Vytorin [Ezetimibe -Simvastatin] Other (See Comments)    myalgias   Codeine  Nausea And Vomiting         Unithroid  [Levothyroxine  Sodium] Other (See Comments)    Caused blurry vision per pt    Family History  Problem Relation Age of Onset   Heart disease Father    Heart attack Father    Heart disease Sister    Hypertension Sister    Heart attack Sister    Hypertension Mother    Diabetes Son     Prior to Admission medications   Medication Sig Start Date End Date Taking? Authorizing Provider  ALPRAZolam  (XANAX ) 0.5 MG tablet Take 0.5 mg by mouth 3 (three) times daily as needed for anxiety or sleep. 10/07/21   [provider]  amiodarone  (PACERONE ) 200 MG tablet Take 200 mg by mouth daily. 08/14/24   [provider]  amoxicillin  (AMOXIL ) 500 MG tablet Take 4 tablets (2,000 mg total) by mouth as directed. Please take 4 tablets 1 hour prior to dental work, including cleanings 09/06/24   Sebastian Lamarr SAUNDERS, PA-C  apixaban  (ELIQUIS ) 5 MG TABS tablet Take 5 mg by mouth 2 (two) times daily.    [provider]  bisacodyl  (DULCOLAX) 5 MG  EC tablet Take 10 mg by mouth at bedtime.    [provider]  colchicine  0.6 MG tablet Take 1 tablet (0.6 mg total) by mouth 2 (two) times daily. 01/03/24   Magdalen Pasco RAMAN, DPM  Evolocumab  (REPATHA  SURECLICK) 140 MG/ML SOAJ Inject 140 mg into the skin every 14 (fourteen) days. 03/23/24   Patwardhan, Newman PARAS, MD  isosorbide  mononitrate (IMDUR ) 30 MG 24 hr tablet Take 1 tablet (30 mg total) by mouth daily. 06/02/24   Patwardhan, Manish J,  MD  levothyroxine  (SYNTHROID ) 50 MCG tablet Take 1 tablet (50 mcg total) by mouth daily before breakfast. PLEASE SCHEDULE APPOINTMENT FOR MORE REFILLS 07/14/24   Rolan Ezra RAMAN, MD  meclizine  (ANTIVERT ) 12.5 MG tablet TAKE 2 TABLETS BY MOUTH 3 TIMES A DAY AS NEEDED (REFILLS MUST COME FROM PCP) 10/18/24   Patwardhan, Newman PARAS, MD  methocarbamol  (ROBAXIN ) 500 MG tablet Take 500 mg by mouth daily as needed for muscle spasms (back pain.).    [provider]  mirtazapine  (REMERON ) 15 MG tablet Take 15 mg by mouth at bedtime. 08/16/24   [provider]  nitroGLYCERIN  (NITROSTAT ) 0.4 MG SL tablet Place 1 tablet (0.4 mg total) under the tongue every 5 (five) minutes as needed for chest pain. 07/09/21   Waddell Danelle ORN, MD  ondansetron  (ZOFRAN ) 4 MG tablet Take 4 mg by mouth every 8 (eight) hours as needed for nausea or vomiting.    [provider]  oxyCODONE  (ROXICODONE ) 5 MG immediate release tablet Take 1 tablet (5 mg total) by mouth every 6 (six) hours as needed. 02/24/24   Rhyne, Samantha J, PA-C  pantoprazole  (PROTONIX ) 40 MG tablet Take 1 tablet (40 mg total) by mouth daily. 11/12/21   Lelon Hamilton T, PA-C  polyethylene glycol powder (GLYCOLAX /MIRALAX ) 17 GM/SCOOP powder Take 17 g by mouth at bedtime. 01/21/22   [provider]  tobramycin  (TOBREX ) 0.3 % ophthalmic solution Place 1 drop into the left eye daily as needed (Dry eyes).    [provider]  torsemide  (DEMADEX ) 20 MG tablet Take 1 tablet (20 mg total) by mouth  daily as needed. 09/13/24   Glena Harlene HERO, FNP    Physical Exam: Vitals:   10/22/24 0015 10/22/24 0030 10/22/24 0045 10/22/24 0100  BP: 114/60 106/62 96/68 (!) 106/48  Pulse: 78 (!) 58 88 70  Resp: (!) 23 17 (!) 22 (!) 23  Temp:      TempSrc:      SpO2: 100% 100% 100% 100%  Weight:      Height:        Physical Exam Vitals reviewed.  Constitutional:      General: He is not in acute distress. HENT:     Head: Normocephalic and atraumatic.  Eyes:     Extraocular Movements: Extraocular movements intact.  Cardiovascular:     Rate and Rhythm: Normal rate and regular rhythm.  Pulmonary:     Effort: Pulmonary effort is normal. No respiratory distress.     Breath sounds: Rales present.  Abdominal:     General: Bowel sounds are normal.     Palpations: Abdomen is soft.     Tenderness: There is no abdominal tenderness. There is no guarding.  Musculoskeletal:     Cervical back: Normal range of motion.     Comments: 1+ pedal edema bilaterally  Skin:    General: Skin is warm and dry.  Neurological:     General: No focal deficit present.     Mental Status: He is alert and oriented to person, place, and time.     Labs on Admission: I have personally reviewed following labs and imaging studies  CBC: Recent Labs  Lab 10/21/24 2256 10/21/24 2340  WBC 14.5* 13.5*  NEUTROABS  --  9.1*  HGB 12.5* 12.0*  HCT 39.4 38.2*  MCV 98.0 99.7  PLT 275 264   Basic Metabolic Panel: Recent Labs  Lab 10/21/24 2256  NA 139  K 3.5  CL 103  CO2 21*  GLUCOSE  161*  BUN 20  CREATININE 1.56*  CALCIUM  8.6*   GFR: Estimated Creatinine Clearance: 29.9 mL/min (A) (by C-G formula based on SCr of 1.56 mg/dL (H)). Liver Function Tests: Recent Labs  Lab 10/21/24 2256  AST 33  ALT 14  ALKPHOS 93  BILITOT 0.7  PROT 7.1  ALBUMIN  3.4*   No results for input(s): LIPASE, AMYLASE in the last 168 hours. No results for input(s): AMMONIA in the last 168 hours. Coagulation  Profile: No results for input(s): INR, PROTIME in the last 168 hours. Cardiac Enzymes: No results for input(s): CKTOTAL, CKMB, CKMBINDEX, TROPONINI in the last 168 hours. BNP (last 3 results) Recent Labs    11/11/23 1201  PROBNP 4,367*   HbA1C: No results for input(s): HGBA1C in the last 72 hours. CBG: No results for input(s): GLUCAP in the last 168 hours. Lipid Profile: No results for input(s): CHOL, HDL, LDLCALC, TRIG, CHOLHDL, LDLDIRECT in the last 72 hours. Thyroid  Function Tests: No results for input(s): TSH, T4TOTAL, FREET4, T3FREE, THYROIDAB in the last 72 hours. Anemia Panel: No results for input(s): VITAMINB12, FOLATE, FERRITIN, TIBC, IRON, RETICCTPCT in the last 72 hours. Urine analysis:    Component Value Date/Time   COLORURINE AMBER (A) 09/01/2024 1032   APPEARANCEUR CLOUDY (A) 09/01/2024 1032   LABSPEC 1.017 09/01/2024 1032   PHURINE 5.0 09/01/2024 1032   GLUCOSEU NEGATIVE 09/01/2024 1032   HGBUR NEGATIVE 09/01/2024 1032   BILIRUBINUR NEGATIVE 09/01/2024 1032   KETONESUR NEGATIVE 09/01/2024 1032   PROTEINUR 30 (A) 09/01/2024 1032   UROBILINOGEN 1.0 04/04/2015 2320   NITRITE NEGATIVE 09/01/2024 1032   LEUKOCYTESUR MODERATE (A) 09/01/2024 1032    Radiological Exams on Admission: DG Chest Portable 1 View Result Date: 10/21/2024 EXAM: 1 VIEW(S) XRAY OF THE CHEST 10/21/2024 11:06:00 PM COMPARISON: 09/25/2024 CLINICAL HISTORY: sob sob FINDINGS: LUNGS AND PLEURA: Bilateral interstitial/airspace opacities, likely edema. Small bilateral effusions. No pneumothorax. HEART AND MEDIASTINUM: Surgical changes related to prior CABG and valve replacement. Left AICD is unchanged. Cardiomegaly with vascular congestion. BONES AND SOFT TISSUES: No acute osseous abnormality. IMPRESSION: 1. Cardiomegaly with vascular congestion and bilateral interstitial/airspace opacities, likely edema. 2. Small bilateral pleural effusions.  Electronically signed by: Franky Crease MD 10/21/2024 11:26 PM EDT RP Workstation: [id number]    Assessment and Plan  Acute hypoxemic respiratory failure secondary to acute on chronic combined CHF Patient reports having shortness of breath for the past 1 week.  BNP 1575.  Chest x-ray showing pulmonary edema and small bilateral pleural effusions.  Last echo done 09/06/2024 showing EF 45 to 50%, grade 2 diastolic dysfunction.   Patient was given IV Lasix  40 mg and placed on BiPAP in the ED.  Work of breathing has now significantly improved.  Blood pressure soft.  Will hold off ordering additional doses of diuretics at this time.  Monitor intake and output, daily weights.  Dietary sodium restriction.  I have sent a message to cardiology requesting consultation in the morning.  Persistent A-fib with RVR Heart rate improving.  Continue amiodarone  drip and Eliquis .  I have sent a message to cardiology requesting consultation in the morning.  Elevated troponin Likely secondary to demand ischemia in the setting of problems listed above.  Patient is not endorsing chest pain.  Troponin 67, continue to trend.  CKD stage IIIa Creatinine 1.5 (stable since labs a month ago but baseline creatinine around 1.2).  Patient was given IV Lasix  in the ED.  Repeat labs in the morning to check renal function.  Chronic normocytic  anemia Hemoglobin stable.  CAD status post CABG PAD Carotid artery disease History of CVA Hyperlipidemia Gout BPH GERD Hypothyroidism Unable to verify remainder of home medications with the patient.  Pharmacy med rec pending.  DVT prophylaxis: Eliquis  Code Status: Full Code (discussed with the patient) Family Communication: No family available at this time. Level of care: Progressive Care Unit Admission status: It is my clinical opinion that admission to INPATIENT is reasonable and necessary because of the expectation that this patient will require hospital care that crosses at least  2 midnights to treat this condition based on the medical complexity of the problems presented.  Given the aforementioned information, the predictability of an adverse outcome is felt to be significant.  Editha Ram MD [facility]  If 7PM-7AM, please contact night-coverage [email or web address]  10/22/2024, 1:29 AM

## 2024-10-26 NOTE — Telephone Encounter (Signed)
 Per Dr. Taylor-episode appears to be Afib with RVR.  Pt has follow up with Heart Failure scheduled.  Will have HF assess for any med changes needed.

## 2024-10-27 ENCOUNTER — Ambulatory Visit (HOSPITAL_BASED_OUTPATIENT_CLINIC_OR_DEPARTMENT_OTHER)
Admission: RE | Admit: 2024-10-27 | Discharge: 2024-10-27 | Disposition: A | Discharge status: Home / Self Care | Source: Ambulatory Visit | Attending: Physician Assistant | Admitting: Physician Assistant

## 2024-10-27 ENCOUNTER — Ambulatory Visit (HOSPITAL_COMMUNITY)
Admission: RE | Admit: 2024-10-27 | Discharge: 2024-10-27 | Disposition: A | Discharge status: Home / Self Care | Source: Ambulatory Visit | Attending: Physician Assistant | Admitting: Physician Assistant

## 2024-10-27 ENCOUNTER — Ambulatory Visit (HOSPITAL_COMMUNITY): Payer: Self-pay | Admitting: Cardiology

## 2024-10-27 ENCOUNTER — Encounter (HOSPITAL_COMMUNITY): Payer: Self-pay

## 2024-10-27 VITALS — BP 142/58 | HR 66 | Wt 126.4 lb

## 2024-10-27 DIAGNOSIS — I4821 Permanent atrial fibrillation: Secondary | ICD-10-CM | POA: Insufficient documentation

## 2024-10-27 DIAGNOSIS — I129 Hypertensive chronic kidney disease with stage 1 through stage 4 chronic kidney disease, or unspecified chronic kidney disease: Secondary | ICD-10-CM | POA: Diagnosis not present

## 2024-10-27 DIAGNOSIS — Z79899 Other long term (current) drug therapy: Secondary | ICD-10-CM | POA: Diagnosis not present

## 2024-10-27 DIAGNOSIS — Z952 Presence of prosthetic heart valve: Secondary | ICD-10-CM | POA: Diagnosis not present

## 2024-10-27 DIAGNOSIS — I739 Peripheral vascular disease, unspecified: Secondary | ICD-10-CM | POA: Insufficient documentation

## 2024-10-27 DIAGNOSIS — Z951 Presence of aortocoronary bypass graft: Secondary | ICD-10-CM | POA: Diagnosis not present

## 2024-10-27 DIAGNOSIS — Z95810 Presence of automatic (implantable) cardiac defibrillator: Secondary | ICD-10-CM | POA: Diagnosis not present

## 2024-10-27 DIAGNOSIS — T8203XA Leakage of heart valve prosthesis, initial encounter: Secondary | ICD-10-CM | POA: Insufficient documentation

## 2024-10-27 DIAGNOSIS — Z8673 Personal history of transient ischemic attack (TIA), and cerebral infarction without residual deficits: Secondary | ICD-10-CM | POA: Diagnosis not present

## 2024-10-27 DIAGNOSIS — I5022 Chronic systolic (congestive) heart failure: Secondary | ICD-10-CM | POA: Diagnosis not present

## 2024-10-27 DIAGNOSIS — I472 Ventricular tachycardia, unspecified: Secondary | ICD-10-CM | POA: Insufficient documentation

## 2024-10-27 DIAGNOSIS — I255 Ischemic cardiomyopathy: Secondary | ICD-10-CM | POA: Insufficient documentation

## 2024-10-27 DIAGNOSIS — Z95820 Peripheral vascular angioplasty status with implants and grafts: Secondary | ICD-10-CM | POA: Insufficient documentation

## 2024-10-27 DIAGNOSIS — I2510 Atherosclerotic heart disease of native coronary artery without angina pectoris: Secondary | ICD-10-CM | POA: Insufficient documentation

## 2024-10-27 DIAGNOSIS — E8582 Wild-type transthyretin-related (ATTR) amyloidosis: Secondary | ICD-10-CM | POA: Insufficient documentation

## 2024-10-27 DIAGNOSIS — N183 Chronic kidney disease, stage 3 unspecified: Secondary | ICD-10-CM | POA: Diagnosis not present

## 2024-10-27 DIAGNOSIS — Z7901 Long term (current) use of anticoagulants: Secondary | ICD-10-CM | POA: Diagnosis not present

## 2024-10-27 DIAGNOSIS — I340 Nonrheumatic mitral (valve) insufficiency: Secondary | ICD-10-CM | POA: Insufficient documentation

## 2024-10-27 DIAGNOSIS — Z9682 Presence of neurostimulator: Secondary | ICD-10-CM | POA: Diagnosis not present

## 2024-10-27 LAB — BASIC METABOLIC PANEL WITH GFR
Anion gap: 13 (ref 5–15)
BUN: 23 mg/dL (ref 8–23)
CO2: 25 mmol/L (ref 22–32)
Calcium: 8.8 mg/dL — ABNORMAL LOW (ref 8.9–10.3)
Chloride: 101 mmol/L (ref 98–111)
Creatinine, Ser: 1.57 mg/dL — ABNORMAL HIGH (ref 0.61–1.24)
GFR, Estimated: 43 mL/min — ABNORMAL LOW (ref >60)
Glucose, Bld: 107 mg/dL — ABNORMAL HIGH (ref 70–99)
Potassium: 4 mmol/L (ref 3.5–5.1)
Sodium: 139 mmol/L (ref 135–145)

## 2024-10-27 LAB — ECHOCARDIOGRAM COMPLETE
AR max vel: 3.28 cm2
AV Area VTI: 3.14 cm2
AV Area mean vel: 2.91 cm2
AV Mean grad: 4 mmHg
AV Peak grad: 7 mmHg
Ao pk vel: 1.33 m/s
Area-P 1/2: 3.17 cm2
Calc EF: 47.6 %
S' Lateral: 3.8 cm
Single Plane A2C EF: 58.8 %
Single Plane A4C EF: 35.3 %

## 2024-10-27 LAB — BRAIN NATRIURETIC PEPTIDE: B Natriuretic Peptide: 986.5 pg/mL — ABNORMAL HIGH (ref 0.0–100.0)

## 2024-10-27 MED ORDER — ISOSORBIDE MONONITRATE ER 30 MG PO TB24: 15.0000 mg | ORAL_TABLET | Freq: Every day | ORAL | 0 refills | Status: AC

## 2024-10-27 NOTE — Progress Notes (Signed)
  Echocardiogram 2D Echocardiogram has been performed.  Ryan [name] 10/27/2024, 1:56 PM

## 2024-10-27 NOTE — Progress Notes (Signed)
 ReDS Vest / Clip - 10/27/24 1400       ReDS Vest / Clip   Station Marker A    Ruler Value 24    ReDS Value Range Low volume    ReDS Actual Value 32

## 2024-10-27 NOTE — Patient Instructions (Addendum)
 Good to see you today!  DECREASE Imdur  to 15 mg (1/2 tablet) daily  Labs done today, your results will be available in MyChart, we will contact you for abnormal readings.  Genetic testing collected  we will call you when we have the results it takes 1-2 weeks  Your physician recommends that you schedule a follow-up appointment   If you have any questions or concerns before your next appointment please send us  a message through [place] or call our office at [phone].    TO LEAVE A MESSAGE FOR THE NURSE SELECT OPTION 2, PLEASE LEAVE A MESSAGE INCLUDING: YOUR NAME DATE OF BIRTH CALL BACK NUMBER REASON FOR CALL**this is important as we prioritize the call backs  YOU WILL RECEIVE A CALL BACK THE SAME DAY AS LONG AS YOU CALL BEFORE 4:00 PM At the [facility], you and your health needs are our priority. As part of our continuing mission to provide you with exceptional heart care, we have created designated Provider Care Teams. These Care Teams include your primary Cardiologist (physician) and Advanced Practice Providers (APPs- Physician Assistants and Nurse Practitioners) who all work together to provide you with the care you need, when you need it.   You may see any of the following providers on your designated Care Team at your next follow up: Dr Toribio Fuel Dr Ezra Shuck Dr. Morene Brownie Greig Mosses, NP Caffie Shed, [place] [facility] [place], [place] Beckey Coe, NP Jordan Lee, NP Ellouise Class, NP Tinnie Redman, PharmD Jaun Bash, PharmD   Please be sure to bring in all your medications bottles to every appointment.    Thank you for choosing [facility]

## 2024-10-27 NOTE — Progress Notes (Signed)
 ADVANCED HF CLINIC NOTE  PCP: [name], MD Cardiology: Dr. Wendel HF Cardiology: Dr. Rolan  Reason for Visit: f/u for HF   HPI: 83 y.o. with history of CAD s/p CABG, ischemic cardiomyopathy, permanent atrial fibrillation, PAD, and CKD stage 3 was referred by Dr. Elmira for evaluation of CHF.  Patient is s/p CABG, last cath in 5/23 showed occluded native vessels with patent LIMA-LAD (occluded SVG-ramus and SVG-PDA), no interventional target.  Last echo in 8/24 showed EF 25-30%, moderate LVH, severe LV dilation, mild aortic stenosis, mild MR.  Patient has a [employer] ICD.  He has had atrial fibrillation that appears to be permanent; all ECGs since 2021 show atrial fibrillation.  He has PAD and is s/p aortofemoral bypass.  He has had VT episodes, the last was on 11/08/23.    RHC in 12/24 showed normal filling pressures, CI 2.55 by Fick.    S/p Barostim placement 02/23/24.  Seen by Dr. Wendel 07/18/24 for TAVR eval. Echo showed LF/LG AS. Work up started for [employer]. Unable to have cMRI due to barostim device.  [facility] 8/25 showed a patent LIMA to the LAD with a 50% ostial stenosis.  There is severe native three-vessel disease with 90% restenosis within the previously placed left circumflex stent, he has known occlusion of his vein grafts; RA 6, PA 38/10 (20), PCWP 14 (with v waves up to 23 mmhg), CO/CI (Fick) 3.25/2.01, PVR 1.8 WU  and PAPi 4.7  S/p TAVR 09/05/24. Post op echo showed EF 45-50%, G2DD, normal RV, stable TAVR valve.  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 Visual Grade 3   At post hospital f/u 09/12/24, he was felt to be hypovolemic and BP was soft. Symptomatic w/ orthostasis. He had reported loss of appeitite/ poor PO intake. Entresto  was discontinued. Torsemide  held for a day, then advised to restart at lower dose of 20 mg daily.   Unfortunately, he got readmitted 10/21/24 for a/c CHF w/ acute pulmonary edema and hypoxic respiratory failure requiring  NRB in the ED. Was also in Afib w/ RVR on admit. Diuresed w/ IV Lasix  and started on amio gtt. He improved and was transitioned back to oral torsemide  at 20 mg daily. He was also transitioned to oral amio to help w/ rate control. Discharge wt 129 lb.   He presents today for post hospital f/u. Here w/ wife and son. Also just completed 2D echo (interpretation pending)   Device interrogation done 2 days ago showed elevated HL score at 45. Activity level 0.8 hr/day. Mean HR 71 bpm. No VT However ReDs measured today and normal at 32%. Euvolemic on exam. No resting dyspnea.  Has felt weak w/ some occasional positional dizziness but feels functional status has improved and stable since being discharged home, NYHA Class II. We checked orthostatic vital signs and he is not orthostatic . He denies CP .     Geographical Information Systems Officer (personally reviewed): HL score 45, average HR 71 bpm, 0.8 hr/day activity, no VT    Labs (8/24): K 4.9, creatinine 1.6, pro-BNP 3916 Labs (11/24): pro-BNP 4367, LFTs normal Labs (12/24): K 3.6, creatinine 1.46 Labs (5/25): LDL 44 Labs (9/25): K 4.0, creatinine 1.26 Labs (11/25): K 4.0, creatinine 1.56    ECG (personally reviewed): none ordered today.    PMH: 1. PAD: H/o aortobifemoral bypass 2. CVA 3. CKD stage 3 4. Gout 5. Hypothyroidism 6. Hyperlipidemia 7. VT: Amiodarone  use. 8. Atrial fibrillation: Permanent, in atrial fibrillation since 2021.  9. CAD: S/p CABG.  - LHC (5/23): 99% prox/mid LCx (subtotal occlusion), occluded mid LAD, 95% D1, occluded RCA, occluded SVG-PDA, occluded SVG-ramus, patent LIMA-LAD with collaterals from LAD to RCA and LCx territories.  10. Chronic systolic CHF: Ischemic cardiomyopathy: [employer] ICD.  - Echo (8/24) with EF 25-30%, moderate LVH, severe LV dilation, mild aortic stenosis, mild MR.  - R/LHC (8/25): patent LIMA to the LAD with a 50% ostial stenosis.  There is severe native three-vessel disease with 90%  restenosis within the previously placed left circumflex stent, he has known occlusion of his vein grafts; RA 6, PA 38/10 (20), PCWP 14 (with v waves up to 23 mmhg), CO/CI (Fick) 3.25/2.01, PVR 1.8 WU  and PAPi 4.7 11. Aortic stenosis: Mild on 8/24 echo.  - s/p TAVR 9/25  SH: Married, no smoking/ETOH.  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   Family History  Problem Relation Age of Onset   Heart disease Father    Heart attack Father    Heart disease Sister    Hypertension Sister    Heart attack Sister    Hypertension Mother    Diabetes Son    ROS: All systems reviewed and negative except as per HPI.   Current Outpatient Medications  Medication Sig Dispense Refill   amiodarone  (PACERONE ) 200 MG tablet Take 200 mg by mouth daily.     amoxicillin  (AMOXIL ) 500 MG tablet Take 4 tablets (2,000 mg total) by mouth as directed. Please take 4 tablets 1 hour prior to dental work, including cleanings 12 tablet 12   apixaban  (ELIQUIS ) 2.5 MG TABS tablet Take 1 tablet (2.5 mg total) by mouth 2 (two) times daily. 60 tablet 1   bisacodyl  (DULCOLAX) 5 MG EC tablet Take 10 mg by mouth at bedtime.     colchicine  0.6 MG tablet Take 1 tablet (0.6 mg total) by mouth 2 (two) times daily. 60 tablet 3   Evolocumab  (REPATHA  SURECLICK) 140 MG/ML SOAJ Inject 140 mg into the skin every 14 (fourteen) days. 2 mL 0   levothyroxine  (SYNTHROID ) 50 MCG tablet Take 1 tablet (50 mcg total) by mouth daily before breakfast. PLEASE SCHEDULE APPOINTMENT FOR MORE REFILLS 90 tablet 0   meclizine  (ANTIVERT ) 12.5 MG tablet TAKE 2 TABLETS BY MOUTH 3 TIMES A DAY AS NEEDED (REFILLS MUST COME FROM PCP) 60 tablet 0   mirtazapine  (REMERON ) 15 MG tablet Take 15 mg by mouth at bedtime.     nitroGLYCERIN  (NITROSTAT ) 0.4 MG SL tablet Place 1 tablet (0.4 mg total) under the tongue every 5 (five) minutes as needed for chest pain. 25 tablet 3   ondansetron  (ZOFRAN ) 4 MG tablet Take 4 mg by mouth every 8 (eight) hours as  needed for nausea or vomiting.     oxyCODONE  (ROXICODONE ) 5 MG immediate release tablet Take 1 tablet (5 mg total) by mouth every 6 (six) hours as needed. 8 tablet 0   pantoprazole  (PROTONIX ) 40 MG tablet Take 1 tablet (40 mg total) by mouth daily. 30 tablet 11   polyethylene glycol powder (GLYCOLAX /MIRALAX ) 17 GM/SCOOP powder Take 17 g by mouth at bedtime.     tobramycin  (TOBREX ) 0.3 % ophthalmic solution Place 1 drop into the left eye daily as needed (Dry eyes).     torsemide  (DEMADEX ) 20 MG tablet Take 1 tablet (20 mg total) by mouth daily. Take this daily until your follow up appointment on Friday with cardiology, then take as recommended.     isosorbide  mononitrate (IMDUR )  30 MG 24 hr tablet Take 0.5 tablets (15 mg total) by mouth daily. 30 tablet 0   No current facility-administered medications for this encounter.   Wt Readings from Last 3 Encounters:  10/27/24 57.3 kg (126 lb 6.4 oz)  10/21/24 59 kg (130 lb)  09/25/24 56.7 kg (125 lb)   BP (!) 142/58   Pulse 66   Wt 57.3 kg (126 lb 6.4 oz)   SpO2 99%   BMI 21.03 kg/m   Physical Exam: Vitals:   10/27/24 1359  BP: (!) 142/58  Pulse: 66  SpO2: 99%   ReDs 32%, normal  GENERAL: elderly male, NAD Lungs- clear  CARDIAC:  JVP: not elevated         Irregularly irregular regular and rate. No MRG murmur.  No LEE  ABDOMEN: Soft, non-tender, non-distended.  EXTREMITIES: Warm and well perfused.  NEUROLOGIC: No obvious FND   Assessment/Plan: 1. Chronic systolic CHF/ Ischemic cardiomyopathy + TTR Cardiac Amyloid: s/p  [employer] ICD.  Echo in 8/24 showed EF 25-30%, moderate LVH, severe LV dilation, mild aortic stenosis, mild MR.  RHC in 12/24 showed normal filling pressures, CI 2.55. Echo 4/25 with hypertrophy and reduced mitral tissue, concerning for amyloidosis, EF 50-55%, mildly reduced RV. S/P BaroStim placement with Dr. Serene. Unable to have cMRI due to barostim. 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 Stable NYHA Class II.  Euvolemic on exam and by ReDs 32%.  - arrange for amyloid labs, check UPEP/SPEP and multiple myeloma. Send for genetic testing  - continue torsemide  20 mg daily  - he is off digoxin  given suspected amyloid  - off Entersto d/t low BP and orthostasis - He failed both Jardiance  and Farxiga  due to nausea - Off bisoprolol  with bradycardia - reduced Imdur  to 15 mg daily  - check BMP and BNP today  2. LFLG Aortic Stenosis: Echo 4/25 with LFLG AS with AVA 0.75cm2, DI 0.24, Vmax 2.1 m/s - now s/p TAVR 9/25.  - post TAVR echo completed today, interpretation pending  3. CAD: s/p CABG.  Cath in 5/23 without interventional option.  Severe disease of native vessels, occluded SVG-ramus and SVG-PDA, patent LIMA-LAD. No chest pain.  - No ASA given Eliquis  use.  - Continue Repatha .  - reduce Imdur  down to 30 mg daily given positional dizziness  4. Atrial fibrillation: Present since 2021. Prev thought to be permanent, however ECGs from 2/25 and 7/25 showed SB with 1AVB.  - Continue apixaban , dosed at 2.5 bid for age > 56, creatinine usually > 1.5. denies gross bleeding  5. CKD stage 3: baseline SCr 1.2. - check BMP today  6. VT: Last episode in 11/24 terminated by ATP.  No further VT on device interrogation. - Continue amio 200 mg daily. check LFTs and TSH  in 4-6 wks. He will need a regular eye exam while on amiodarone . 7. PAD: H/o aortobifemoral bypass.  He denies claudication.  - Continue statin.   F/u w/ APP in 4 wks  Caffie Shed, PA-C 10/27/2024

## 2024-10-30 ENCOUNTER — Telehealth: Payer: Self-pay

## 2024-10-30 ENCOUNTER — Other Ambulatory Visit: Payer: Self-pay | Admitting: Internal Medicine

## 2024-10-30 DIAGNOSIS — I5023 Acute on chronic systolic (congestive) heart failure: Secondary | ICD-10-CM | POA: Diagnosis not present

## 2024-10-30 DIAGNOSIS — M79644 Pain in right finger(s): Secondary | ICD-10-CM | POA: Diagnosis not present

## 2024-10-30 DIAGNOSIS — N1831 Chronic kidney disease, stage 3a: Secondary | ICD-10-CM | POA: Diagnosis not present

## 2024-10-30 DIAGNOSIS — Z23 Encounter for immunization: Secondary | ICD-10-CM | POA: Diagnosis not present

## 2024-10-30 DIAGNOSIS — E039 Hypothyroidism, unspecified: Secondary | ICD-10-CM | POA: Diagnosis not present

## 2024-10-30 DIAGNOSIS — I480 Paroxysmal atrial fibrillation: Secondary | ICD-10-CM | POA: Diagnosis not present

## 2024-10-30 LAB — KAPPA/LAMBDA LIGHT CHAINS
Kappa free light chain: 55.9 mg/L — ABNORMAL HIGH (ref 3.3–19.4)
Kappa, lambda light chain ratio: 1.58 (ref 0.26–1.65)
Lambda free light chains: 35.3 mg/L — ABNORMAL HIGH (ref 5.7–26.3)

## 2024-10-30 MED ORDER — AMIODARONE HCL 200 MG PO TABS: ORAL_TABLET | ORAL | 3 refills | Status: DC

## 2024-10-30 NOTE — Telephone Encounter (Signed)
 Discussed with Dr. Waddell.  Pt cannot take digoxin  d/t suspected amyloid.  Not on beta blocker d/t sinus brady.  Will increase amiodarone  200 mg to one tablet by mouth daily Monday through Thursday.  Take TWO tablets by mouth Friday, Saturday and Sunday.  Follow up appointment made in 6 weeks to reevaluate.  May need atrial lead.  Pt's wife advised of above per Pt request.  New prescription sent.  Follow up scheduled.  Per wife Pt is not currently taking colchicine .

## 2024-10-30 NOTE — Addendum Note (Signed)
 Encounter addended by: Izetta Concetta KIDD, CMA on: 10/30/2024 8:55 AM  Actions taken: Flowsheet accepted

## 2024-10-30 NOTE — Telephone Encounter (Signed)
 Alert received:  Alert remote transmission: Antitachycardia pacing (ATP) therapy delivered to convert arrhythmia VT-1 event on 11/7 at 1615 with V-rates 167 bpm treated with ATPx5.  Duration 1 min 27 sec.  Single lead.  Regular and irregular R-R. To triage. 29 NSVT events since 10/30, V-rates 160-170s up to 55 sec. Irregular and regular R-R.

## 2024-10-31 ENCOUNTER — Ambulatory Visit: Payer: Self-pay | Admitting: Physician Assistant

## 2024-10-31 ENCOUNTER — Telehealth (HOSPITAL_COMMUNITY): Payer: Self-pay | Admitting: *Deleted
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
## 2024-11-01 LAB — IMMUNOFIXATION, URINE

## 2024-11-02 LAB — MULTIPLE MYELOMA PANEL, SERUM
Albumin SerPl Elph-Mcnc: 3.4 g/dL (ref 2.9–4.4)
Albumin/Glob SerPl: 1.1 (ref 0.7–1.7)
Alpha 1: 0.3 g/dL (ref 0.0–0.4)
Alpha2 Glob SerPl Elph-Mcnc: 0.8 g/dL (ref 0.4–1.0)
B-Globulin SerPl Elph-Mcnc: 1.3 g/dL (ref 0.7–1.3)
Gamma Glob SerPl Elph-Mcnc: 0.9 g/dL (ref 0.4–1.8)
Globulin, Total: 3.4 g/dL (ref 2.2–3.9)
IgA: 489 mg/dL — ABNORMAL HIGH (ref 61–437)
IgG (Immunoglobin G), Serum: 1137 mg/dL (ref 603–1613)
IgM (Immunoglobulin M), Srm: 66 mg/dL (ref 15–143)
Total Protein ELP: 6.8 g/dL (ref 6.0–8.5)

## 2024-11-07 ENCOUNTER — Telehealth (HOSPITAL_COMMUNITY): Payer: Self-pay

## 2024-11-07 NOTE — Telephone Encounter (Signed)
 Pt insurance is active and benefits verified through [employer]. Co-pay $15, DED $0/$0 met, out of pocket $3,400/$2,089.63 met, co-insurance 0%. No pre-authorization required. 11/07/2024 @ 1:49pm, spoke with [place], REF# [id number].  TCR/ICR? ICR Visit(date of service)limitation? No Can multiple codes be used on the same date of service/visit?(IF ITS A LIMIT) N/A  Is this a lifetime maximum or an annual maximum? Annual Has the member used any of these services to date? No Is there a time limit (weeks/months) on start of program and/or program completion? No

## 2024-11-08 ENCOUNTER — Telehealth (HOSPITAL_COMMUNITY): Payer: Self-pay

## 2024-11-08 NOTE — Telephone Encounter (Signed)
 Patient called back, stated he doesn't want to do the program until he's talked to Dr. Rolan about it. Patient will call us  back if he's interested in the program, he does not want us  to f/u with him about it.  Closing referral.

## 2024-11-08 NOTE — Telephone Encounter (Signed)
 Attempted to schedule patient for cardiac rehab- no answer, left message. Sent MyChart message.

## 2024-11-09 ENCOUNTER — Encounter: Payer: Self-pay | Admitting: Student

## 2024-11-22 NOTE — Progress Notes (Incomplete)
 ADVANCED HF CLINIC NOTE  PCP: [name], MD Cardiology: Dr. Wendel HF Cardiology: Dr. Rolan  Reason for Visit: f/u for HF   HPI: 83 y.o. with history of CAD s/p CABG, ischemic cardiomyopathy, permanent atrial fibrillation, PAD, and CKD stage 3 was referred by Dr. Elmira for evaluation of CHF.  Patient is s/p CABG, last cath in 5/23 showed occluded native vessels with patent LIMA-LAD (occluded SVG-ramus and SVG-PDA), no interventional target.  Last echo in 8/24 showed EF 25-30%, moderate LVH, severe LV dilation, mild aortic stenosis, mild MR.  Patient has a [employer] ICD.  He has had atrial fibrillation that appears to be permanent; all ECGs since 2021 show atrial fibrillation.  He has PAD and is s/p aortofemoral bypass.  He has had VT episodes, the last was on 11/08/23.    RHC in 12/24 showed normal filling pressures, CI 2.55 by Fick.    S/p Barostim placement 02/23/24.  Seen by Dr. Wendel 07/18/24 for TAVR eval. Echo showed LF/LG AS. Work up started for [employer]. Unable to have cMRI due to barostim device.  [facility] 8/25 showed a patent LIMA to the LAD with a 50% ostial stenosis.  There is severe native three-vessel disease with 90% restenosis within the previously placed left circumflex stent, he has known occlusion of his vein grafts; RA 6, PA 38/10 (20), PCWP 14 (with v waves up to 23 mmhg), CO/CI (Fick) 3.25/2.01, PVR 1.8 WU  and PAPi 4.7  S/p TAVR 09/05/24. Post op echo showed EF 45-50%, G2DD, normal RV, stable TAVR valve.  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 Visual Grade 3   At post hospital f/u 09/12/24, he was felt to be hypovolemic and BP was soft. Symptomatic w/ orthostasis. He had reported loss of appeitite/ poor PO intake. Entresto  was discontinued. Torsemide  held for a day, then advised to restart at lower dose of 20 mg daily.   Unfortunately, he got readmitted 10/21/24 for a/c CHF w/ acute pulmonary edema and hypoxic respiratory failure requiring  NRB in the ED. Was also in Afib w/ RVR on admit. Diuresed w/ IV Lasix  and started on amio gtt. He improved and was transitioned back to oral torsemide  at 20 mg daily. He was also transitioned to oral amio to help w/ rate control. Discharge wt 129 lb.   He presents today for post hospital f/u. Here w/ wife and son. Also just completed 2D echo (interpretation pending)   Device interrogation done 2 days ago showed elevated HL score at 45. Activity level 0.8 hr/day. Mean HR 71 bpm. No VT However ReDs measured today and normal at 32%. Euvolemic on exam. No resting dyspnea.  Has felt weak w/ some occasional positional dizziness but feels functional status has improved and stable since being discharged home, NYHA Class II. We checked orthostatic vital signs and he is not orthostatic . He denies CP .     Geographical Information Systems Officer (personally reviewed): HL score 45, average HR 71 bpm, 0.8 hr/day activity, no VT    Labs (8/24): K 4.9, creatinine 1.6, pro-BNP 3916 Labs (11/24): pro-BNP 4367, LFTs normal Labs (12/24): K 3.6, creatinine 1.46 Labs (5/25): LDL 44 Labs (9/25): K 4.0, creatinine 1.26 Labs (11/25): K 4.0, creatinine 1.56    ECG (personally reviewed): none ordered today.    PMH: 1. PAD: H/o aortobifemoral bypass 2. CVA 3. CKD stage 3 4. Gout 5. Hypothyroidism 6. Hyperlipidemia 7. VT: Amiodarone  use. 8. Atrial fibrillation: Permanent, in atrial fibrillation since 2021.  9. CAD: S/p CABG.  - LHC (5/23): 99% prox/mid LCx (subtotal occlusion), occluded mid LAD, 95% D1, occluded RCA, occluded SVG-PDA, occluded SVG-ramus, patent LIMA-LAD with collaterals from LAD to RCA and LCx territories.  10. Chronic systolic CHF: Ischemic cardiomyopathy: [employer] ICD.  - Echo (8/24) with EF 25-30%, moderate LVH, severe LV dilation, mild aortic stenosis, mild MR.  - R/LHC (8/25): patent LIMA to the LAD with a 50% ostial stenosis.  There is severe native three-vessel disease with 90%  restenosis within the previously placed left circumflex stent, he has known occlusion of his vein grafts; RA 6, PA 38/10 (20), PCWP 14 (with v waves up to 23 mmhg), CO/CI (Fick) 3.25/2.01, PVR 1.8 WU  and PAPi 4.7 11. Aortic stenosis: Mild on 8/24 echo.  - s/p TAVR 9/25  SH: Married, no smoking/ETOH.  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   Family History  Problem Relation Age of Onset   Heart disease Father    Heart attack Father    Heart disease Sister    Hypertension Sister    Heart attack Sister    Hypertension Mother    Diabetes Son    ROS: All systems reviewed and negative except as per HPI.   Current Outpatient Medications  Medication Sig Dispense Refill   amiodarone  (PACERONE ) 200 MG tablet Take one tablet by mouth Monday, Tuesday, Wednesday and Thursday.  Take TWO tablets by mouth Friday, Saturday and Sunday. 90 tablet 3   amoxicillin  (AMOXIL ) 500 MG tablet Take 4 tablets (2,000 mg total) by mouth as directed. Please take 4 tablets 1 hour prior to dental work, including cleanings 12 tablet 12   apixaban  (ELIQUIS ) 2.5 MG TABS tablet Take 1 tablet (2.5 mg total) by mouth 2 (two) times daily. 60 tablet 1   bisacodyl  (DULCOLAX) 5 MG EC tablet Take 10 mg by mouth at bedtime.     colchicine  0.6 MG tablet Take 1 tablet (0.6 mg total) by mouth 2 (two) times daily. 60 tablet 3   Evolocumab  (REPATHA  SURECLICK) 140 MG/ML SOAJ Inject 140 mg into the skin every 14 (fourteen) days. 2 mL 0   isosorbide  mononitrate (IMDUR ) 30 MG 24 hr tablet Take 0.5 tablets (15 mg total) by mouth daily. 30 tablet 0   levothyroxine  (SYNTHROID ) 50 MCG tablet Take 1 tablet (50 mcg total) by mouth daily before breakfast. PLEASE SCHEDULE APPOINTMENT FOR MORE REFILLS 90 tablet 0   meclizine  (ANTIVERT ) 12.5 MG tablet TAKE 2 TABLETS BY MOUTH 3 TIMES A DAY AS NEEDED (REFILLS MUST COME FROM PCP) 60 tablet 0   mirtazapine  (REMERON ) 15 MG tablet Take 15 mg by mouth at bedtime.     nitroGLYCERIN   (NITROSTAT ) 0.4 MG SL tablet Place 1 tablet (0.4 mg total) under the tongue every 5 (five) minutes as needed for chest pain. 25 tablet 3   ondansetron  (ZOFRAN ) 4 MG tablet Take 4 mg by mouth every 8 (eight) hours as needed for nausea or vomiting.     oxyCODONE  (ROXICODONE ) 5 MG immediate release tablet Take 1 tablet (5 mg total) by mouth every 6 (six) hours as needed. 8 tablet 0   pantoprazole  (PROTONIX ) 40 MG tablet Take 1 tablet (40 mg total) by mouth daily. 30 tablet 11   polyethylene glycol powder (GLYCOLAX /MIRALAX ) 17 GM/SCOOP powder Take 17 g by mouth at bedtime.     tobramycin  (TOBREX ) 0.3 % ophthalmic solution Place 1 drop into the left eye daily as needed (Dry eyes).  torsemide  (DEMADEX ) 20 MG tablet Take 1 tablet (20 mg total) by mouth daily. Take this daily until your follow up appointment on Friday with cardiology, then take as recommended.     No current facility-administered medications for this visit.   Wt Readings from Last 3 Encounters:  10/27/24 57.3 kg (126 lb 6.4 oz)  10/21/24 59 kg (130 lb)  09/25/24 56.7 kg (125 lb)   There were no vitals taken for this visit.  Physical Exam: There were no vitals filed for this visit.  ReDs 32%, normal  GENERAL: elderly male, NAD Lungs- clear  CARDIAC:  JVP: not elevated         Irregularly irregular regular and rate. No MRG murmur.  No LEE  ABDOMEN: Soft, non-tender, non-distended.  EXTREMITIES: Warm and well perfused.  NEUROLOGIC: No obvious FND   Assessment/Plan: 1. Chronic systolic CHF/ Ischemic cardiomyopathy + TTR Cardiac Amyloid: s/p  [employer] ICD.  Echo in 8/24 showed EF 25-30%, moderate LVH, severe LV dilation, mild aortic stenosis, mild MR.  RHC in 12/24 showed normal filling pressures, CI 2.55. Echo 4/25 with hypertrophy and reduced mitral tissue, concerning for amyloidosis, EF 50-55%, mildly reduced RV. S/P BaroStim placement with Dr. Serene. Unable to have cMRI due to barostim. 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 Stable NYHA Class II. Euvolemic on exam and by ReDs 32%.  - arrange for amyloid labs, check UPEP/SPEP and multiple myeloma. Send for genetic testing  - continue torsemide  20 mg daily  - he is off digoxin  given suspected amyloid  - off Entersto d/t low BP and orthostasis - He failed both Jardiance  and Farxiga  due to nausea - Off bisoprolol  with bradycardia - reduced Imdur  to 15 mg daily  - check BMP and BNP today  2. LFLG Aortic Stenosis: Echo 4/25 with LFLG AS with AVA 0.75cm2, DI 0.24, Vmax 2.1 m/s - now s/p TAVR 9/25.  - post TAVR echo completed today, interpretation pending  3. CAD: s/p CABG.  Cath in 5/23 without interventional option.  Severe disease of native vessels, occluded SVG-ramus and SVG-PDA, patent LIMA-LAD. No chest pain.  - No ASA given Eliquis  use.  - Continue Repatha .  - reduce Imdur  down to 30 mg daily given positional dizziness  4. Atrial fibrillation: Present since 2021. Prev thought to be permanent, however ECGs from 2/25 and 7/25 showed SB with 1AVB.  - Continue apixaban , dosed at 2.5 bid for age > 7, creatinine usually > 1.5. denies gross bleeding  5. CKD stage 3: baseline SCr 1.2. - check BMP today  6. VT: Last episode in 11/24 terminated by ATP.  No further VT on device interrogation. - Continue amio 200 mg daily. check LFTs and TSH  in 4-6 wks. He will need a regular eye exam while on amiodarone . 7. PAD: H/o aortobifemoral bypass.  He denies claudication.  - Continue statin.   F/u w/ APP in 4 wks  Harlene HERO Harrison, [place] 11/22/2024

## 2024-11-23 ENCOUNTER — Other Ambulatory Visit: Payer: Self-pay | Admitting: Cardiology

## 2024-11-23 DIAGNOSIS — R42 Dizziness and giddiness: Secondary | ICD-10-CM

## 2024-11-24 ENCOUNTER — Ambulatory Visit (HOSPITAL_COMMUNITY)

## 2024-11-27 NOTE — Telephone Encounter (Signed)
 From what I understand, he had requested provider switch to Dr. Wendel so I have not been seeing him lately. That said, I am okay to allow one refill. Future refills should go to PCP or Dr Wendel.  Thanks MJP

## 2024-11-28 ENCOUNTER — Telehealth (HOSPITAL_COMMUNITY): Payer: Self-pay

## 2024-11-28 NOTE — Progress Notes (Signed)
 ADVANCED HF CLINIC NOTE  PCP: [name], MD Cardiology: Dr. Wendel HF Cardiology: Dr. Rolan  Reason for Visit: f/u for HF   HPI: 83 y.o. with history of CAD s/p CABG, ischemic cardiomyopathy, permanent atrial fibrillation, PAD, and CKD stage 3 was referred by Dr. Elmira for evaluation of CHF.  Patient is s/p CABG, last cath in 5/23 showed occluded native vessels with patent LIMA-LAD (occluded SVG-ramus and SVG-PDA), no interventional target.  Last echo in 8/24 showed EF 25-30%, moderate LVH, severe LV dilation, mild aortic stenosis, mild MR.  Patient has a Environmental Manager ICD.  He has had atrial fibrillation that appears to be permanent; all ECGs since 2021 show atrial fibrillation.  He has PAD and is s/p aortofemoral bypass.  He has had VT episodes, the last was on 11/08/23.    RHC in 12/24 showed normal filling pressures, CI 2.55 by Fick.    S/p Barostim placement 02/23/24.  Seen by Dr. Wendel 07/18/24 for TAVR eval. Echo showed LF/LG AS. Work up started for [employer]. Unable to have cMRI due to barostim device.  [facility] 8/25 showed a patent LIMA to the LAD with a 50% ostial stenosis.  There is severe native three-vessel disease with 90% restenosis within the previously placed left circumflex stent, he has known occlusion of his vein grafts; RA 6, PA 38/10 (20), PCWP 14 (with v waves up to 23 mmhg), CO/CI (Fick) 3.25/2.01, PVR 1.8 WU  and PAPi 4.7  S/p TAVR 09/05/24. Post op echo showed EF 45-50%, G2DD, normal RV, stable TAVR valve.  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 Visual Grade 3   At post hospital f/u 09/12/24, he was felt to be hypovolemic and BP was soft. Symptomatic w/ orthostasis. He had reported loss of appeitite/ poor PO intake. Entresto  was discontinued. Torsemide  held for a day, then advised to restart at lower dose of 20 mg daily.   Unfortunately, he got readmitted 10/21/24 for a/c CHF w/ acute pulmonary edema and hypoxic respiratory failure requiring  NRB in the ED. Was also in Afib w/ RVR on admit. Diuresed w/ IV Lasix  and started on amio gtt. He improved and was transitioned back to oral torsemide  at 20 mg daily. He was also transitioned to oral amio to help w/ rate control. Discharge wt 129 lb.   He presents today for post hospital f/u. Here w/ wife and son. Also just completed 2D echo (interpretation pending)   Today he returns for AHF follow up. Overall feeling ***. Denies palpitations, CP, dizziness, edema, or PND/Orthopnea. *** SOB. Appetite ok. No fever or chills. Weight at home *** pounds. Taking all medications. Denies ETOH, tobacco or drug use.    Geographical Information Systems Officer (personally reviewed): HL score 45, average HR 71 bpm, 0.8 hr/day activity, no VT  ***   ECG (personally reviewed): none ordered today.    PMH: 1. PAD: H/o aortobifemoral bypass 2. CVA 3. CKD stage 3 4. Gout 5. Hypothyroidism 6. Hyperlipidemia 7. VT: Amiodarone  use. 8. Atrial fibrillation: Permanent, in atrial fibrillation since 2021.  9. CAD: S/p CABG.  - LHC (5/23): 99% prox/mid LCx (subtotal occlusion), occluded mid LAD, 95% D1, occluded RCA, occluded SVG-PDA, occluded SVG-ramus, patent LIMA-LAD with collaterals from LAD to RCA and LCx territories.  10. Chronic systolic CHF: Ischemic cardiomyopathy: [employer] ICD.  - Echo (8/24) with EF 25-30%, moderate LVH, severe LV dilation, mild aortic stenosis, mild MR.  - R/LHC (8/25): patent LIMA to the LAD with a 50%  ostial stenosis.  There is severe native three-vessel disease with 90% restenosis within the previously placed left circumflex stent, he has known occlusion of his vein grafts; RA 6, PA 38/10 (20), PCWP 14 (with v waves up to 23 mmhg), CO/CI (Fick) 3.25/2.01, PVR 1.8 WU  and PAPi 4.7 11. Aortic stenosis: Mild on 8/24 echo.  - s/p TAVR 9/25  SH: Married, no smoking/ETOH.  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   Family History  Problem Relation Age of Onset    Heart disease Father    Heart attack Father    Heart disease Sister    Hypertension Sister    Heart attack Sister    Hypertension Mother    Diabetes Son    ROS: All systems reviewed and negative except as per HPI.   Current Outpatient Medications  Medication Sig Dispense Refill   amiodarone  (PACERONE ) 200 MG tablet Take one tablet by mouth Monday, Tuesday, Wednesday and Thursday.  Take TWO tablets by mouth Friday, Saturday and Sunday. 90 tablet 3   amoxicillin  (AMOXIL ) 500 MG tablet Take 4 tablets (2,000 mg total) by mouth as directed. Please take 4 tablets 1 hour prior to dental work, including cleanings 12 tablet 12   apixaban  (ELIQUIS ) 2.5 MG TABS tablet Take 1 tablet (2.5 mg total) by mouth 2 (two) times daily. 60 tablet 1   bisacodyl  (DULCOLAX) 5 MG EC tablet Take 10 mg by mouth at bedtime.     colchicine  0.6 MG tablet Take 1 tablet (0.6 mg total) by mouth 2 (two) times daily. 60 tablet 3   Evolocumab  (REPATHA  SURECLICK) 140 MG/ML SOAJ Inject 140 mg into the skin every 14 (fourteen) days. 2 mL 0   isosorbide  mononitrate (IMDUR ) 30 MG 24 hr tablet Take 0.5 tablets (15 mg total) by mouth daily. 30 tablet 0   levothyroxine  (SYNTHROID ) 50 MCG tablet Take 1 tablet (50 mcg total) by mouth daily before breakfast. PLEASE SCHEDULE APPOINTMENT FOR MORE REFILLS 90 tablet 0   meclizine  (ANTIVERT ) 12.5 MG tablet TAKE 2 TABLETS BY MOUTH 3 TIMES A DAY AS NEEDED; CONTACT YOUR PRIMARY CARE PHYSICIAN FOR FURTHER REFILLS ! 60 tablet 0   mirtazapine  (REMERON ) 15 MG tablet Take 15 mg by mouth at bedtime.     nitroGLYCERIN  (NITROSTAT ) 0.4 MG SL tablet Place 1 tablet (0.4 mg total) under the tongue every 5 (five) minutes as needed for chest pain. 25 tablet 3   ondansetron  (ZOFRAN ) 4 MG tablet Take 4 mg by mouth every 8 (eight) hours as needed for nausea or vomiting.     oxyCODONE  (ROXICODONE ) 5 MG immediate release tablet Take 1 tablet (5 mg total) by mouth every 6 (six) hours as needed. 8 tablet 0    pantoprazole  (PROTONIX ) 40 MG tablet Take 1 tablet (40 mg total) by mouth daily. 30 tablet 11   polyethylene glycol powder (GLYCOLAX /MIRALAX ) 17 GM/SCOOP powder Take 17 g by mouth at bedtime.     tobramycin  (TOBREX ) 0.3 % ophthalmic solution Place 1 drop into the left eye daily as needed (Dry eyes).     torsemide  (DEMADEX ) 20 MG tablet Take 1 tablet (20 mg total) by mouth daily. Take this daily until your follow up appointment on Friday with cardiology, then take as recommended.     No current facility-administered medications for this visit.   Wt Readings from Last 3 Encounters:  10/27/24 57.3 kg (126 lb 6.4 oz)  10/21/24 59 kg (130 lb)  09/25/24 56.7 kg (125 lb)  There were no vitals taken for this visit.  Physical Exam: There were no vitals filed for this visit. General:  *** appearing.  No respiratory difficulty Neck: JVD *** cm.  Cor: Regular rate & rhythm. No murmurs. Lungs: clear Extremities: no edema  Neuro: alert & oriented x 3. Affect pleasant.   ReDs reading: *** %, {Norm/abn:16337}   Assessment/Plan: 1. Chronic systolic CHF/ Ischemic cardiomyopathy + TTR Cardiac Amyloid: s/p  [employer] ICD.  Echo in 8/24 showed EF 25-30%, moderate LVH, severe LV dilation, mild aortic stenosis, mild MR.  RHC in 12/24 showed normal filling pressures, CI 2.55. Echo 4/25 with hypertrophy and reduced mitral tissue, concerning for amyloidosis, EF 50-55%, mildly reduced RV. S/P BaroStim placement with Dr. Serene. Unable to have cMRI due to barostim. 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 Stable NYHA Class II. Euvolemic on exam and by ReDs 32%.  - arrange for amyloid labs, check UPEP/SPEP and multiple myeloma. Send for genetic testing  *** No M spike observed.  - continue torsemide  20 mg daily  - he is off digoxin  given suspected amyloid  - off Entersto d/t low BP and orthostasis - He failed both Jardiance  and Farxiga  due to nausea - Off bisoprolol  with bradycardia - reduced Imdur  to 15 mg  daily  - check BMP and BNP today  2. LFLG Aortic Stenosis: Echo 4/25 with LFLG AS with AVA 0.75cm2, DI 0.24, Vmax 2.1 m/s - now s/p TAVR 9/25.  - post TAVR echo completed today, interpretation pending  3. CAD: s/p CABG.  Cath in 5/23 without interventional option.  Severe disease of native vessels, occluded SVG-ramus and SVG-PDA, patent LIMA-LAD. No chest pain.  - No ASA given Eliquis  use.  - Continue Repatha .  - reduce Imdur  down to 30 mg daily given positional dizziness  4. Atrial fibrillation: Present since 2021. Prev thought to be permanent, however ECGs from 2/25 and 7/25 showed SB with 1AVB.  - Continue apixaban , dosed at 2.5 bid for age > 85, creatinine usually > 1.5. denies gross bleeding  5. CKD stage 3: baseline SCr 1.2. - check BMP today  6. VT: Last episode in 11/24 terminated by ATP.  No further VT on device interrogation. - Continue amio 200 mg daily. check LFTs and TSH  in 4-6 wks. He will need a regular eye exam while on amiodarone . 7. PAD: H/o aortobifemoral bypass.  He denies claudication.  - Continue statin.   F/u w/ APP in 4 wks ***  Beckey LITTIE Coe, NP 11/28/2024

## 2024-11-28 NOTE — Telephone Encounter (Signed)
 Called to confirm/remind patient of their appointment at the [facility] on 11/29/24 1:30.   Appointment:   [x] Confirmed  [] Left mess   [] No answer/No voice mail  [] VM Full/unable to leave message  [] Phone not in service  Patient reminded to bring all medications and/or complete list.  Confirmed patient has transportation. Gave directions, instructed to utilize valet parking.

## 2024-11-28 NOTE — Telephone Encounter (Signed)
 Called to confirm/remind patient of their appointment at the [facility] on 11/28/2024.   Appointment:   [x] Confirmed  [] Left mess   [] No answer/No voice mail  [] VM Full/unable to leave message  [] Phone not in service  Patient reminded to bring all medications and/or complete list.  Confirmed patient has transportation. Gave directions, instructed to utilize valet parking.

## 2024-11-29 ENCOUNTER — Other Ambulatory Visit: Payer: Self-pay

## 2024-11-29 ENCOUNTER — Ambulatory Visit (HOSPITAL_COMMUNITY): Payer: Self-pay | Admitting: Internal Medicine

## 2024-11-29 ENCOUNTER — Ambulatory Visit (HOSPITAL_COMMUNITY): Admission: RE | Admit: 2024-11-29 | Discharge: 2024-11-29 | Attending: Internal Medicine

## 2024-11-29 ENCOUNTER — Encounter (HOSPITAL_COMMUNITY): Payer: Self-pay

## 2024-11-29 ENCOUNTER — Telehealth (HOSPITAL_COMMUNITY): Payer: Self-pay

## 2024-11-29 ENCOUNTER — Other Ambulatory Visit (HOSPITAL_COMMUNITY): Payer: Self-pay

## 2024-11-29 VITALS — BP 120/70 | HR 67 | Ht 65.0 in | Wt 127.0 lb

## 2024-11-29 DIAGNOSIS — I4819 Other persistent atrial fibrillation: Secondary | ICD-10-CM

## 2024-11-29 DIAGNOSIS — I2510 Atherosclerotic heart disease of native coronary artery without angina pectoris: Secondary | ICD-10-CM | POA: Insufficient documentation

## 2024-11-29 DIAGNOSIS — R5383 Other fatigue: Secondary | ICD-10-CM | POA: Diagnosis not present

## 2024-11-29 DIAGNOSIS — I255 Ischemic cardiomyopathy: Secondary | ICD-10-CM | POA: Insufficient documentation

## 2024-11-29 DIAGNOSIS — I739 Peripheral vascular disease, unspecified: Secondary | ICD-10-CM | POA: Diagnosis not present

## 2024-11-29 DIAGNOSIS — Z952 Presence of prosthetic heart valve: Secondary | ICD-10-CM | POA: Diagnosis not present

## 2024-11-29 DIAGNOSIS — N1831 Chronic kidney disease, stage 3a: Secondary | ICD-10-CM | POA: Diagnosis not present

## 2024-11-29 DIAGNOSIS — I5022 Chronic systolic (congestive) heart failure: Secondary | ICD-10-CM | POA: Diagnosis not present

## 2024-11-29 DIAGNOSIS — Z7901 Long term (current) use of anticoagulants: Secondary | ICD-10-CM | POA: Insufficient documentation

## 2024-11-29 DIAGNOSIS — I25810 Atherosclerosis of coronary artery bypass graft(s) without angina pectoris: Secondary | ICD-10-CM | POA: Diagnosis not present

## 2024-11-29 DIAGNOSIS — Z9682 Presence of neurostimulator: Secondary | ICD-10-CM | POA: Insufficient documentation

## 2024-11-29 DIAGNOSIS — I472 Ventricular tachycardia, unspecified: Secondary | ICD-10-CM | POA: Diagnosis not present

## 2024-11-29 DIAGNOSIS — Z95810 Presence of automatic (implantable) cardiac defibrillator: Secondary | ICD-10-CM | POA: Insufficient documentation

## 2024-11-29 DIAGNOSIS — Z951 Presence of aortocoronary bypass graft: Secondary | ICD-10-CM | POA: Insufficient documentation

## 2024-11-29 DIAGNOSIS — I4821 Permanent atrial fibrillation: Secondary | ICD-10-CM | POA: Insufficient documentation

## 2024-11-29 DIAGNOSIS — Z95820 Peripheral vascular angioplasty status with implants and grafts: Secondary | ICD-10-CM | POA: Insufficient documentation

## 2024-11-29 DIAGNOSIS — N183 Chronic kidney disease, stage 3 unspecified: Secondary | ICD-10-CM | POA: Insufficient documentation

## 2024-11-29 LAB — BASIC METABOLIC PANEL WITH GFR
Anion gap: 12 (ref 5–15)
BUN: 52 mg/dL — ABNORMAL HIGH (ref 8–23)
CO2: 29 mmol/L (ref 22–32)
Calcium: 9.2 mg/dL (ref 8.9–10.3)
Chloride: 99 mmol/L (ref 98–111)
Creatinine, Ser: 1.76 mg/dL — ABNORMAL HIGH (ref 0.61–1.24)
GFR, Estimated: 38 mL/min — ABNORMAL LOW (ref >60)
Glucose, Bld: 93 mg/dL (ref 70–99)
Potassium: 4.5 mmol/L (ref 3.5–5.1)
Sodium: 140 mmol/L (ref 135–145)

## 2024-11-29 LAB — BRAIN NATRIURETIC PEPTIDE: B Natriuretic Peptide: 790.6 pg/mL — ABNORMAL HIGH (ref 0.0–100.0)

## 2024-11-29 LAB — IRON AND TIBC
Iron: 41 ug/dL — ABNORMAL LOW (ref 45–182)
Saturation Ratios: 10 % — ABNORMAL LOW (ref 17.9–39.5)
TIBC: 414 ug/dL (ref 250–450)
UIBC: 373 ug/dL

## 2024-11-29 LAB — FERRITIN: Ferritin: 37 ng/mL (ref 24–336)

## 2024-11-29 MED ORDER — TAFAMIDIS 61 MG PO CAPS
61.0000 mg | ORAL_CAPSULE | Freq: Every day | ORAL | 5 refills | Status: DC
  Filled 2024-11-29: qty 30, 30d supply, fill #0

## 2024-11-29 MED ORDER — TORSEMIDE 20 MG PO TABS: 20.0000 mg | ORAL_TABLET | ORAL | 3 refills | Status: AC

## 2024-11-29 NOTE — Progress Notes (Signed)
 ReDS Vest / Clip - 11/29/24 1335       ReDS Vest / Clip   Station Marker C    Ruler Value 32    ReDS Value Range Low volume    ReDS Actual Value 27

## 2024-11-29 NOTE — Patient Instructions (Signed)
 Medication Changes:  DECREASE TORSEMIDE  TO 20MG  ONCE DAILY ON MONDAY, WEDNESDAY, AND FRIDAY'S   START TAFAMADIS 61MG  ONCE DAILY   Lab Work:  Labs done today, your results will be available in MyChart, we will contact you for abnormal readings.  Referrals:  YOU HAVE BEEN REFERRED TO PHYSICAL THERAPY AT HOME THEY WILL REACH OUT TO YOU OR CALL TO ARRANGE THIS. PLEASE CALL US  WITH ANY CONCERNS  Follow-Up in: 1 MONTH AS SCHEDULED   At the [facility], you and your health needs are our priority. We have a designated team specialized in the treatment of Heart Failure. This Care Team includes your primary Heart Failure Specialized Cardiologist (physician), Advanced Practice Providers (APPs- Physician Assistants and Nurse Practitioners), and Pharmacist who all work together to provide you with the care you need, when you need it.   You may see any of the following providers on your designated Care Team at your next follow up:  Dr. Toribio Fuel Dr. Ezra Shuck Dr. Odis Brownie Greig Mosses, NP Caffie Shed, [place] [facility] [place], [place] Beckey Coe, NP Jordan Lee, NP Tinnie Redman, PharmD   Please be sure to bring in all your medications bottles to every appointment.   Need to Contact Us :  If you have any questions or concerns before your next appointment please send us  a message through [place] or call our office at [phone].    TO LEAVE A MESSAGE FOR THE NURSE SELECT OPTION 2, PLEASE LEAVE A MESSAGE INCLUDING: YOUR NAME DATE OF BIRTH CALL BACK NUMBER REASON FOR CALL**this is important as we prioritize the call backs  YOU WILL RECEIVE A CALL BACK THE SAME DAY AS LONG AS YOU CALL BEFORE 4:00 PM

## 2024-11-30 ENCOUNTER — Other Ambulatory Visit (HOSPITAL_COMMUNITY): Payer: Self-pay

## 2024-11-30 ENCOUNTER — Other Ambulatory Visit (HOSPITAL_COMMUNITY): Payer: Self-pay | Admitting: Pharmacist

## 2024-11-30 ENCOUNTER — Encounter (HOSPITAL_COMMUNITY): Payer: Self-pay

## 2024-11-30 ENCOUNTER — Other Ambulatory Visit (HOSPITAL_COMMUNITY): Payer: Self-pay | Admitting: *Deleted

## 2024-11-30 ENCOUNTER — Telehealth (HOSPITAL_COMMUNITY): Payer: Self-pay | Admitting: *Deleted

## 2024-11-30 ENCOUNTER — Telehealth (HOSPITAL_COMMUNITY): Payer: Self-pay | Admitting: Pharmacy Technician

## 2024-11-30 ENCOUNTER — Other Ambulatory Visit: Payer: Self-pay

## 2024-11-30 ENCOUNTER — Telehealth (HOSPITAL_COMMUNITY): Payer: Self-pay | Admitting: Internal Medicine

## 2024-11-30 DIAGNOSIS — E611 Iron deficiency: Secondary | ICD-10-CM

## 2024-11-30 DIAGNOSIS — I5022 Chronic systolic (congestive) heart failure: Secondary | ICD-10-CM

## 2024-11-30 MED ORDER — TAFAMIDIS 61 MG PO CAPS
61.0000 mg | ORAL_CAPSULE | Freq: Every day | ORAL | 5 refills | Status: AC
  Filled 2024-11-30: qty 30, 30d supply, fill #0
  Filled 2024-12-27 – 2025-01-01 (×2): qty 30, 30d supply, fill #1

## 2024-11-30 MED ORDER — FERUMOXYTOL INJECTION 510 MG/17 ML: 510.0000 mg | Freq: Once | INTRAVENOUS | 0 refills | Status: AC

## 2024-11-30 NOTE — Telephone Encounter (Signed)
 Called patient's wife per Beckey Coe, NP with following:  Fluid marker down trending and renal function going up. Diuretics decreased today. Iron stores low, please arrange IV iron.  Wife verbalized understanding and agreement to same. Referral place for IV iron and message sent to pharmacy for same.

## 2024-11-30 NOTE — Progress Notes (Signed)
 Specialty Pharmacy Initiation Note   Ryan [name] is a 83 y.o. male who will be followed by the specialty pharmacy service for RxSp Cardiology    Review of administration, indication, effectiveness, safety, potential side effects, storage/disposable, and missed dose instructions occurred today for patient's specialty medication(s) Tafamidis      Patient/Caregiver did not have any additional questions or concerns.   Patient's therapy is appropriate to: Initiate    Goals Addressed             This Visit's Progress    Stabilization of disease       Patient is initiating therapy. Patient will maintain adherence

## 2024-11-30 NOTE — Progress Notes (Signed)
 Specialty Pharmacy Initial Fill Coordination Note  Ryan [name] is a 83 y.o. male contacted today regarding initial fill of specialty medication(s) Tafamidis    Patient requested Delivery   Delivery date: 12/05/24   Verified address: [address] [place] [address]   Medication will be filled on: 12/04/24   Patient is aware of $0 copayment.

## 2024-11-30 NOTE — Telephone Encounter (Signed)
 Auth Submission: NO AUTH NEEDED Site of care: [facility] Payer: [employer] Medication & CPT/J Code(s) submitted: Feraheme (ferumoxytol) [id number] Diagnosis Code: I50.22, E61.1 Route of submission (phone, fax, portal):  Phone # Fax # Auth type: Buy/Bill HB Units/visits requested: 510mg  x 2 doses Reference number:  Approval from: 11/30/2024 to 02/28/25    Dagoberto Armour, CPhT [facility] Phone: [phone] 11/30/2024

## 2024-11-30 NOTE — Telephone Encounter (Signed)
 Patient referred to infusion pharmacy team for ambulatory infusion of IV iron.  Insurance - [employer]ef[employer] Teacher of care - Site of care: [facility] Dx code - I50.22, E61.1 IV Iron Therapy - Feraheme 510 mg x 2 Infusion appointments - Scheduling team will schedule patient as soon as possible.   Thank you,  Norton Blush, PharmD, [facility] Pharmacist Ambulatory Specialty Clinic

## 2024-11-30 NOTE — Telephone Encounter (Signed)
 Advanced Heart Failure Patient Advocate Encounter  Prior authorization for Vyndamax  has been submitted and approved. Test billing returns $0 for 30 day supply.  Key: [id number] Effective: 11/29/2024 to 11/29/2025  Rachel DEL, CPhT Rx Patient Advocate Phone: [phone]

## 2024-12-01 ENCOUNTER — Other Ambulatory Visit (HOSPITAL_COMMUNITY): Payer: Self-pay

## 2024-12-04 ENCOUNTER — Other Ambulatory Visit: Payer: Self-pay

## 2024-12-05 ENCOUNTER — Other Ambulatory Visit: Payer: Self-pay

## 2024-12-05 ENCOUNTER — Other Ambulatory Visit (HOSPITAL_COMMUNITY): Payer: Self-pay

## 2024-12-05 NOTE — Progress Notes (Signed)
 Clinical Intervention Note  Clinical Intervention Notes: Patient called after receiving medication concerned about future fills and cost of those fills. Explained to patient that his copay was $0 and that specialty pharmacy would reach out each month to plan the next refill. Patient stated Arloa Prior told him it was over $1,000 but I reassured patient that his copay was $0 and if it ever goes over that, our patient advocates would work to find a grant or some other copay assistance. Patient was understanding and agreed to begin medication.   Clinical Intervention Outcomes: Improved therapy adherence   [facility] Specialty Pharmacist

## 2024-12-07 ENCOUNTER — Inpatient Hospital Stay (HOSPITAL_COMMUNITY): Admission: RE | Admit: 2024-12-07

## 2024-12-07 VITALS — BP 99/50 | HR 65 | Temp 97.0°F | Resp 16

## 2024-12-07 DIAGNOSIS — I5022 Chronic systolic (congestive) heart failure: Secondary | ICD-10-CM | POA: Diagnosis present

## 2024-12-07 DIAGNOSIS — E611 Iron deficiency: Secondary | ICD-10-CM | POA: Insufficient documentation

## 2024-12-07 MED ORDER — SODIUM CHLORIDE 0.9 % IV SOLN
510.0000 mg | Freq: Once | INTRAVENOUS | Status: AC
  Administered 2024-12-07: 11:00:00 510 mg via INTRAVENOUS
  Filled 2024-12-07: qty 510

## 2024-12-08 ENCOUNTER — Other Ambulatory Visit (HOSPITAL_COMMUNITY): Payer: Self-pay | Admitting: Cardiology

## 2024-12-18 ENCOUNTER — Encounter: Payer: Self-pay | Admitting: Internal Medicine

## 2024-12-18 ENCOUNTER — Ambulatory Visit: Attending: Internal Medicine | Admitting: Internal Medicine

## 2024-12-18 VITALS — BP 108/56 | HR 71 | Ht 66.0 in | Wt 127.1 lb

## 2024-12-18 DIAGNOSIS — I2510 Atherosclerotic heart disease of native coronary artery without angina pectoris: Secondary | ICD-10-CM

## 2024-12-18 LAB — CUP PACEART INCLINIC DEVICE CHECK
Date Time Interrogation Session: 20251229172103
HighPow Impedance: 60 Ohm
HighPow Impedance: 63 Ohm
Implantable Lead Connection Status: 753985
Implantable Lead Implant Date: 20220110
Implantable Lead Location: 753860
Implantable Lead Model: 137
Implantable Lead Serial Number: 301176
Implantable Pulse Generator Implant Date: 20220110
Lead Channel Impedance Value: 567 Ohm
Lead Channel Pacing Threshold Amplitude: 0.5 V
Lead Channel Pacing Threshold Pulse Width: 0.4 ms
Lead Channel Sensing Intrinsic Amplitude: 13.8 mV
Lead Channel Setting Pacing Amplitude: 2.5 V
Lead Channel Setting Pacing Pulse Width: 0.4 ms
Lead Channel Setting Sensing Sensitivity: 0.5 mV
Pulse Gen Serial Number: 211464

## 2024-12-18 MED ORDER — AMIODARONE HCL 200 MG PO TABS: 200.0000 mg | ORAL_TABLET | Freq: Every day | ORAL | 3 refills | Status: AC

## 2024-12-18 NOTE — Patient Instructions (Signed)
 Medication Instructions:  Your physician has recommended you make the following change in your medication:  Decrease amiodarone  to 1 tablet daily.  Lab Work: None ordered.  You may go to any [facility] Location for your lab work:  [facility] - [address] ([facility]) - [phone] [address]  [place] - [address]   [place]  - [address]   [place] - [address] - [address] [place]  - [address] [place] - [address] ([facility]   If you have labs (blood work) drawn today and your tests are completely normal, you will receive your results only by: [employer] (if you have MyChart)  If you have any lab test that is abnormal or we need to change your treatment, we will call you or send a MyChart message to review the results.  Testing/Procedures: None ordered.  Follow-Up: At [facility], you and your health needs are our priority.  As part of our continuing mission to provide you with exceptional heart care, we have created designated Provider Care Teams.  These Care Teams include your primary Cardiologist (physician) and Advanced Practice Providers (APPs -  Physician Assistants and Nurse Practitioners) who all work together to provide you with the care you need, when you need it.  We recommend signing up for the patient portal called MyChart.  Sign up information is provided on this After Visit Summary.  MyChart is used to connect with patients for Virtual Visits (Telemedicine).  Patients are able to view lab/test results, encounter notes, upcoming appointments, etc.  Non-urgent messages can be sent to your provider as well.   To learn more about what you can do with MyChart, go to [email or web address].    Your next appointment:   1 year(s)  The format for your next appointment:   In Person  Provider:   Donnice Primus, MD or one of the following Advanced Practice Providers on your designated Care Team:   Charlies Arthur, [place] Ozell Jodie Passey, [place] Leotis Barrack, NP  Note: Remote monitoring is used to monitor your Pacemaker/ ICD from home. This monitoring reduces the number of office visits required to check your device to one time per year. It allows us  to keep an eye on the functioning of your device to ensure it is working properly.

## 2024-12-18 NOTE — Progress Notes (Signed)
 "     HPI Mr. Aikey returns today for followup. He is a pleasant 83 yo man with a h/o LV dysfunction and VT and atrial fib who presented with VT back in January 2022 and underwent ICD implantation at that time. He has known CAD and is s/p MI remotely. His EF by echo in April demonstrates an EF of 25%. Since his ICD insertion he notes class 2 CHF symptoms. He has had an uptick in his ICD therapies and has increased his dose of amiodarone . He denies chest pain or sob. Since I saw him last in 11/23, he notes some fatigue but has overall feels better and his wife corroborates this. He has noted some dizziness since we uptitrated the amiodarone . He has chronic dyspnea.  Allergies[1]   Current Outpatient Medications  Medication Sig Dispense Refill   amiodarone  (PACERONE ) 200 MG tablet Take one tablet by mouth Monday, Tuesday, Wednesday and Thursday.  Take TWO tablets by mouth Friday, Saturday and Sunday. 90 tablet 3   amoxicillin  (AMOXIL ) 500 MG tablet Take 4 tablets (2,000 mg total) by mouth as directed. Please take 4 tablets 1 hour prior to dental work, including cleanings 12 tablet 12   apixaban  (ELIQUIS ) 2.5 MG TABS tablet Take 1 tablet (2.5 mg total) by mouth 2 (two) times daily. 60 tablet 1   bisacodyl  (DULCOLAX) 5 MG EC tablet Take 10 mg by mouth at bedtime.     colchicine  0.6 MG tablet Take 1 tablet (0.6 mg total) by mouth 2 (two) times daily. 60 tablet 3   Evolocumab  (REPATHA  SURECLICK) 140 MG/ML SOAJ Inject 140 mg into the skin every 14 (fourteen) days. 2 mL 0   ferumoxytol  (FERAHEME) 510 MG/17ML SOLN injection Inject 17 mLs (510 mg total) into the vein once. 17 mL 0   isosorbide  mononitrate (IMDUR ) 30 MG 24 hr tablet Take 0.5 tablets (15 mg total) by mouth daily. 30 tablet 0   levothyroxine  (SYNTHROID ) 50 MCG tablet Take 1 tablet (50 mcg total) by mouth daily before breakfast. PLEASE SCHEDULE APPOINTMENT FOR MORE REFILLS 90 tablet 0   meclizine  (ANTIVERT ) 12.5 MG tablet TAKE 2 TABLETS BY  MOUTH 3 TIMES A DAY AS NEEDED; CONTACT YOUR PRIMARY CARE PHYSICIAN FOR FURTHER REFILLS ! 60 tablet 0   mirtazapine  (REMERON ) 15 MG tablet Take 15 mg by mouth at bedtime.     nitroGLYCERIN  (NITROSTAT ) 0.4 MG SL tablet Place 1 tablet (0.4 mg total) under the tongue every 5 (five) minutes as needed for chest pain. 25 tablet 3   ondansetron  (ZOFRAN ) 4 MG tablet Take 4 mg by mouth every 8 (eight) hours as needed for nausea or vomiting.     oxyCODONE  (ROXICODONE ) 5 MG immediate release tablet Take 1 tablet (5 mg total) by mouth every 6 (six) hours as needed. 8 tablet 0   pantoprazole  (PROTONIX ) 40 MG tablet Take 1 tablet (40 mg total) by mouth daily. 30 tablet 11   polyethylene glycol powder (GLYCOLAX /MIRALAX ) 17 GM/SCOOP powder Take 17 g by mouth at bedtime.     Tafamidis  61 MG CAPS Take 1 capsule (61 mg total) by mouth daily. 30 capsule 5   tobramycin  (TOBREX ) 0.3 % ophthalmic solution Place 1 drop into the left eye daily as needed (Dry eyes).     torsemide  (DEMADEX ) 20 MG tablet Take 1 tablet (20 mg total) by mouth every Monday, Wednesday, and Friday. 30 tablet 3   No current facility-administered medications for this visit.     Past Medical History:  Diagnosis Date   Acquired thrombophilia 11/01/2021   AICD (automatic cardioverter/defibrillator) present    [employer]   Arthritis    CAD (coronary artery disease)    Carotid artery occlusion    Cataract    Bil eyes/worse in left eye   CHF (congestive heart failure) (HCC)    Chronic back pain    COVID-19 10/31/2021   DVT (deep venous thrombosis) (HCC)    Enlarged prostate    takes Rapaflo daily   GERD (gastroesophageal reflux disease)    occasional   History of colon polyps    History of gout    has colchicine  prn   History of kidney stones    Hyperlipidemia    takes Crestor  daily   Hypertension    takes Amlodipine  daily   Hypothyroidism    Myocardial infarction [facility])    Peripheral vascular disease    Prolonged QT  interval 11/01/2021   Pulmonary emboli (HCC) 03/20/2015   elevated d-dimer, intermediate V/Q study, atypical chest pain and SOB. 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 Wendel and Dr. Lucas and Dr. Daniel   Shortness of breath dyspnea    Urinary frequency    Urinary urgency     ROS:   All systems reviewed and negative except as noted in the HPI.   Past Surgical History:  Procedure Laterality Date   APPENDECTOMY     BACK SURGERY     5 times   big toe surgery     CARDIAC CATHETERIZATION     2010    dr alveta   cataract surgery     left eye   CHOLECYSTECTOMY N/A 07/27/2016   Procedure: LAPAROSCOPIC CHOLECYSTECTOMY;  Surgeon: Herlene Beverley Bureau, MD;  Location: [facility] OR;  Service: General;  Laterality: N/A;   COLONOSCOPY     CORONARY ARTERY BYPASS GRAFT N/A 04/05/2015   Procedure: CORONARY ARTERY BYPASS GRAFTING (CABG)X4 LIMA-LAD; SVG-DIAG1-DIAG2; SVG-PD;  Surgeon: Elspeth JAYSON Millers, MD;  Location: [facility] OR;  Service: Open Heart Surgery;  Laterality: N/A;   CORONARY BALLOON ANGIOPLASTY N/A 04/14/2022   Procedure: CORONARY BALLOON ANGIOPLASTY;  Surgeon: Elmira Newman PARAS, MD;  Location: [facility] INVASIVE CV LAB;  Service: Cardiovascular;  Laterality: N/A;   CORONARY ULTRASOUND/IVUS N/A 04/14/2022   Procedure: Intravascular Ultrasound/IVUS;  Surgeon: Elmira Newman PARAS, MD;  Location: [facility] INVASIVE CV LAB;  Service: Cardiovascular;  Laterality: N/A;   CORONARY/GRAFT ANGIOGRAPHY N/A 04/22/2018   Procedure: CORONARY/GRAFT ANGIOGRAPHY;  Surgeon: Jordan, Peter M, MD;  Location: [facility] INVASIVE CV LAB;  Service: Cardiovascular;  Laterality: N/A;   CYSTOSCOPY     ENDARTERECTOMY Left 04/24/2016   Procedure: ENDARTERECTOMY LEFT CAROTID;  Surgeon: Lynwood JONETTA Collum, MD;  Location: [facility] OR;  Service: Vascular;   Laterality: Left;   EYE SURGERY     FEMORAL ARTERY - POPLITEAL ARTERY BYPASS GRAFT     ICD IMPLANT N/A 12/30/2020   Procedure: ICD IMPLANT;  Surgeon: Waddell Danelle ORN, MD;  Location: [facility] INVASIVE CV LAB;  Service: Cardiovascular;  Laterality: N/A;   INTRAOPERATIVE TRANSTHORACIC ECHOCARDIOGRAM N/A 09/05/2024   Procedure: ECHOCARDIOGRAM, TRANSTHORACIC;  Surgeon: Wendel Lurena POUR, MD;  Location: [facility] INVASIVE CV LAB;  Service: Cardiovascular;  Laterality: N/A;   JOINT REPLACEMENT     shoulder   LEFT HEART  CATH N/A 04/14/2022   Procedure: Left Heart Cath;  Surgeon: Elmira Newman PARAS, MD;  Location: [facility] INVASIVE CV LAB;  Service: Cardiovascular;  Laterality: N/A;   LEFT HEART CATH AND CORONARY ANGIOGRAPHY N/A 04/24/2022   Procedure: LEFT HEART CATH AND CORONARY ANGIOGRAPHY;  Surgeon: Ladona Heinz, MD;  Location: [facility] INVASIVE CV LAB;  Service: Cardiovascular;  Laterality: N/A;   LEFT HEART CATHETERIZATION WITH CORONARY ANGIOGRAM N/A 04/03/2015   Procedure: LEFT HEART CATHETERIZATION WITH CORONARY ANGIOGRAM;  Surgeon: Debby DELENA Sor, MD;  Location: [facility] CATH LAB;  Service: Cardiovascular;  Laterality: N/A;   LUMBAR LAMINECTOMY  01/06/2013   Procedure: MICRODISCECTOMY LUMBAR LAMINECTOMY;  Surgeon: Oneil JAYSON Herald, MD;  Location: [facility] OR;  Service: Orthopedics;  Laterality: N/A;  L3-4 decompression   LUMBAR LAMINECTOMY/DECOMPRESSION MICRODISCECTOMY  02/12/2012   Procedure: LUMBAR LAMINECTOMY/DECOMPRESSION MICRODISCECTOMY;  Surgeon: Catalina CHRISTELLA Stains, MD;  Location: [facility] NEURO ORS;  Service: Neurosurgery;  Laterality: N/A;  Lumbar four-five laminectomy   PATCH ANGIOPLASTY Left 04/24/2016   Procedure: LEFT CAROTID ARTERY PATCH ANGIOPLASTY;  Surgeon: Lynwood JONETTA Collum, MD;  Location: [facility] OR;  Service: Vascular;  Laterality: Left;   RIGHT HEART CATH N/A 11/22/2023   Procedure: RIGHT HEART CATH;  Surgeon: Rolan Ezra RAMAN, MD;  Location: [facility] INVASIVE CV LAB;  Service: Cardiovascular;  Laterality: N/A;   RIGHT HEART CATH AND  CORONARY/GRAFT ANGIOGRAPHY N/A 01/30/2019   Procedure: RIGHT HEART CATH AND CORONARY/GRAFT ANGIOGRAPHY;  Surgeon: Sor Debby DELENA, MD;  Location: [facility] INVASIVE CV LAB;  Service: Cardiovascular;  Laterality: N/A;   RIGHT/LEFT HEART CATH AND CORONARY ANGIOGRAPHY N/A 03/17/2022   Procedure: RIGHT/LEFT HEART CATH AND CORONARY ANGIOGRAPHY;  Surgeon: Elmira Newman PARAS, MD;  Location: [facility] INVASIVE CV LAB;  Service: Cardiovascular;  Laterality: N/A;   RIGHT/LEFT HEART CATH AND CORONARY/GRAFT ANGIOGRAPHY N/A 07/27/2024   Procedure: RIGHT/LEFT HEART CATH AND CORONARY/GRAFT ANGIOGRAPHY;  Surgeon: Wendel Lurena POUR, MD;  Location: [facility] INVASIVE CV LAB;  Service: Cardiovascular;  Laterality: N/A;   STERIOD INJECTION Right 01/09/2014   Procedure: STEROID INJECTION;  Surgeon: Lonni CINDERELLA Poli, MD;  Location: [facility] OR;  Service: Orthopedics;  Laterality: Right;   TEE WITHOUT CARDIOVERSION N/A 04/05/2015   Procedure: TRANSESOPHAGEAL ECHOCARDIOGRAM (TEE);  Surgeon: Elspeth JAYSON Millers, MD;  Location: [facility] OR;  Service: Open Heart Surgery;  Laterality: N/A;   TOTAL HIP ARTHROPLASTY Left 01/09/2014   DR POLI   TOTAL HIP ARTHROPLASTY Left 01/09/2014   Procedure: LEFT TOTAL HIP ARTHROPLASTY ANTERIOR APPROACH and Steroid Injection Right hip;  Surgeon: Lonni CINDERELLA Poli, MD;  Location: [facility] OR;  Service: Orthopedics;  Laterality: Left;   TOTAL HIP ARTHROPLASTY Right 08/15/2019   TOTAL HIP ARTHROPLASTY Right 08/15/2019   Procedure: RIGHT TOTAL HIP ARTHROPLASTY ANTERIOR APPROACH;  Surgeon: Poli Lonni CINDERELLA, MD;  Location: [facility] OR;  Service: Orthopedics;  Laterality: Right;     Family History  Problem Relation Age of Onset   Heart disease Father    Heart attack Father    Heart disease Sister    Hypertension Sister    Heart attack Sister    Hypertension Mother    Diabetes Son      Social History   Socioeconomic History   Marital status: Married    Spouse name: Kartel Wolbert   Number of children: 2    Years of education: Not on file   Highest education level: 9th grade  Occupational History   Not on file  Tobacco Use   Smoking status: Former    Current packs/day: 0.00  Average packs/day: 1 pack/day for 25.0 years (25.0 ttl pk-yrs)    Types: Cigarettes    Start date: 02/04/1962    Quit date: 02/04/1987    Years since quitting: 37.8   Smokeless tobacco: Former    Types: Chew    Quit date: 07/20/2009   Tobacco comments:    quit 35+yrs ago  Vaping Use   Vaping status: Never Used  Substance and Sexual Activity   Alcohol  use: No    Alcohol /week: 0.0 standard drinks of alcohol    Drug use: No   Sexual activity: Not Currently  Other Topics Concern   Not on file  Social History Narrative   Not on file   Social Drivers of Health   Tobacco Use: Medium Risk (12/18/2024)   Patient History    Smoking Tobacco Use: Former    Smokeless Tobacco Use: Former    Passive Exposure: Not on Stage Manager: Not on file  Food Insecurity: No Food Insecurity (10/22/2024)   Epic    Worried About Programme Researcher, Broadcasting/film/video in the Last Year: Never true    Ran Out of Food in the Last Year: Never true  Transportation Needs: No Transportation Needs (10/22/2024)   Epic    Lack of Transportation (Medical): No    Lack of Transportation (Non-Medical): No  Physical Activity: Not on file  Stress: Not on file  Social Connections: Moderately Integrated (10/22/2024)   Social Connection and Isolation Panel    Frequency of Communication with Friends and Family: More than three times a week    Frequency of Social Gatherings with Friends and Family: More than three times a week    Attends Religious Services: More than 4 times per year    Active Member of [employer] or Organizations: No    Attends Banker Meetings: Never    Marital Status: Married  Catering Manager Violence: Not At Risk (10/22/2024)   Epic    Fear of Current or Ex-Partner: No    Emotionally Abused: No    Physically Abused:  No    Sexually Abused: No  Depression (PHQ2-9): Low Risk (07/08/2022)   Depression (PHQ2-9)    PHQ-2 Score: 1  Alcohol  Screen: Not on file  Housing: Low Risk (10/22/2024)   Epic    Unable to Pay for Housing in the Last Year: No    Number of Times Moved in the Last Year: 0    Homeless in the Last Year: No  Utilities: Not At Risk (10/22/2024)   Epic    Threatened with loss of utilities: No  Health Literacy: Not on file     BP (!) 108/56   Ht 5' 6 (1.676 m)   Wt 127 lb 1.6 oz (57.7 kg)   BMI 20.51 kg/m   Physical Exam:  Well appearing NAD HEENT: Unremarkable Neck:  No JVD, no thyromegally Lymphatics:  No adenopathy Back:  No CVA tenderness Lungs:  Clear HEART:  Regular rate rhythm, no murmurs, no rubs, no clicks Abd:  soft, positive bowel sounds, no organomegally, no rebound, no guarding Ext:  2 plus pulses, no edema, no cyanosis, no clubbing Skin:  No rashes no nodules Neuro:  CN II through XII intact, motor grossly intact  EKG  DEVICE  Normal device function.  See PaceArt for details.   Assess/Plan:  Lightheaded - his bp is now better. I encouraged him to rest if/when his symptoms return. ICM - he denies anginal symptoms. Chronic systolic heart failure - his symptoms  are class 2. VT - he has had a couple of non-sustained episodes.  Persistent atrial fib - his VR is controlled.  Dizziness - I suspect amiodarone  and I have asked him to decrease the dose of amiodarone .    Danelle Romulo Okray,MD     [1]  Allergies Allergen Reactions   Jardiance  [Empagliflozin ] Nausea Only and Other (See Comments)    Made patient very sick to the stomach.   Zetia  [Ezetimibe ] Other (See Comments)    Myalgias    Ace Inhibitors Cough    Other reaction(s): cough   Aspirin  Other (See Comments)    skin rash/easy bruising   Crestor  [Rosuvastatin ] Other (See Comments)    Myalgias/weakness   Lipitor  [Atorvastatin ] Other (See Comments)    weakness, fatigue (severe)   Vytorin  [Ezetimibe -Simvastatin] Other (See Comments)    myalgias   Codeine  Nausea And Vomiting         Unithroid  [Levothyroxine  Sodium] Other (See Comments)    Caused blurry vision per pt   "

## 2024-12-19 ENCOUNTER — Ambulatory Visit (HOSPITAL_COMMUNITY)
Admission: RE | Admit: 2024-12-19 | Discharge: 2024-12-19 | Disposition: A | Discharge status: Home / Self Care | Source: Ambulatory Visit | Attending: Internal Medicine | Admitting: Internal Medicine

## 2024-12-19 VITALS — BP 118/49 | HR 62 | Temp 98.0°F | Resp 16

## 2024-12-19 DIAGNOSIS — I5022 Chronic systolic (congestive) heart failure: Secondary | ICD-10-CM | POA: Diagnosis present

## 2024-12-19 DIAGNOSIS — E611 Iron deficiency: Secondary | ICD-10-CM | POA: Insufficient documentation

## 2024-12-19 MED ORDER — SODIUM CHLORIDE 0.9 % IV SOLN
510.0000 mg | Freq: Once | INTRAVENOUS | Status: AC
  Administered 2024-12-19: 510 mg via INTRAVENOUS
  Filled 2024-12-19: qty 510

## 2024-12-20 ENCOUNTER — Other Ambulatory Visit (HOSPITAL_COMMUNITY): Payer: Self-pay | Admitting: Cardiology

## 2024-12-20 MED ORDER — APIXABAN 2.5 MG PO TABS: 2.5000 mg | ORAL_TABLET | Freq: Two times a day (BID) | ORAL | 6 refills | Status: AC

## 2024-12-26 ENCOUNTER — Other Ambulatory Visit (HOSPITAL_COMMUNITY): Payer: Self-pay | Admitting: Cardiology

## 2024-12-27 ENCOUNTER — Other Ambulatory Visit (HOSPITAL_COMMUNITY): Payer: Self-pay

## 2024-12-27 ENCOUNTER — Other Ambulatory Visit: Payer: Self-pay

## 2024-12-27 ENCOUNTER — Ambulatory Visit: Attending: Internal Medicine

## 2024-12-27 DIAGNOSIS — I5022 Chronic systolic (congestive) heart failure: Secondary | ICD-10-CM

## 2024-12-28 LAB — CUP PACEART REMOTE DEVICE CHECK
Battery Remaining Longevity: 126 mo
Battery Remaining Percentage: 88 %
Brady Statistic RV Percent Paced: 0 %
Date Time Interrogation Session: 20260107023100
HighPow Impedance: 57 Ohm
Implantable Lead Connection Status: 753985
Implantable Lead Implant Date: 20220110
Implantable Lead Location: 753860
Implantable Lead Model: 137
Implantable Lead Serial Number: 301176
Implantable Pulse Generator Implant Date: 20220110
Lead Channel Impedance Value: 513 Ohm
Lead Channel Setting Pacing Amplitude: 2.5 V
Lead Channel Setting Pacing Pulse Width: 0.4 ms
Lead Channel Setting Sensing Sensitivity: 0.5 mV
Pulse Gen Serial Number: 211464

## 2024-12-29 ENCOUNTER — Telehealth (HOSPITAL_COMMUNITY): Payer: Self-pay

## 2024-12-29 ENCOUNTER — Encounter (HOSPITAL_COMMUNITY): Payer: Self-pay | Admitting: Internal Medicine

## 2024-12-29 ENCOUNTER — Other Ambulatory Visit (HOSPITAL_COMMUNITY): Payer: Self-pay

## 2024-12-29 NOTE — Telephone Encounter (Signed)
 Advanced Heart Failure Patient Advocate Encounter  The patient was approved for a [employer] grant that will help cover the cost of Eliquis , Vyndamax .  Total amount awarded, $7,500.  Effective: 11/29/2024 - 11/28/2025.  BIN [id number] PCN [id number] [id number] ID [id number]  Approval and processing information added to GAILA Rachel DEL, CPhT Rx Patient Advocate Phone: [phone]

## 2025-01-01 ENCOUNTER — Other Ambulatory Visit: Payer: Self-pay

## 2025-01-01 ENCOUNTER — Ambulatory Visit (HOSPITAL_COMMUNITY): Admitting: Cardiology

## 2025-01-01 NOTE — Progress Notes (Signed)
 Remote ICD Transmission

## 2025-01-03 ENCOUNTER — Other Ambulatory Visit (HOSPITAL_COMMUNITY): Payer: Self-pay

## 2025-01-03 ENCOUNTER — Ambulatory Visit: Payer: Self-pay | Admitting: Cardiology

## 2025-01-03 ENCOUNTER — Other Ambulatory Visit: Payer: Self-pay

## 2025-01-03 NOTE — Progress Notes (Signed)
 Specialty Pharmacy Refill Coordination Note  Ryan [name] is a 84 y.o. male contacted today regarding refills of specialty medication(s) Tafamidis    Patient requested Delivery   Delivery date: 01/04/25   Verified address: [address] [place] [address]   Medication will be filled on: 01/03/25  Spoke with patient's wife

## 2025-01-09 ENCOUNTER — Ambulatory Visit (HOSPITAL_COMMUNITY): Admitting: Cardiology

## 2025-01-12 ENCOUNTER — Ambulatory Visit (HOSPITAL_COMMUNITY): Admitting: Cardiology

## 2025-01-15 ENCOUNTER — Encounter: Admitting: Physician Assistant

## 2025-01-22 ENCOUNTER — Encounter: Admitting: Physician Assistant

## 2025-02-28 ENCOUNTER — Ambulatory Visit (HOSPITAL_COMMUNITY): Admitting: Cardiology

## 2025-03-28 ENCOUNTER — Ambulatory Visit

## 2025-06-27 ENCOUNTER — Ambulatory Visit

## 2025-09-13 ENCOUNTER — Ambulatory Visit (HOSPITAL_COMMUNITY)

## 2025-09-13 ENCOUNTER — Ambulatory Visit: Admitting: Physician Assistant
